# Patient Record
Sex: Female | Born: 1945 | Race: White | Hispanic: No | Marital: Married | Smoking: Never smoker
Health system: Southern US, Community
[De-identification: ages and names within clinical notes are randomized; demographics above are authoritative.]

## PROBLEM LIST (undated history)

## (undated) DIAGNOSIS — I739 Peripheral vascular disease, unspecified: Secondary | ICD-10-CM

## (undated) DIAGNOSIS — I2510 Atherosclerotic heart disease of native coronary artery without angina pectoris: Secondary | ICD-10-CM

## (undated) DIAGNOSIS — D649 Anemia, unspecified: Secondary | ICD-10-CM

## (undated) DIAGNOSIS — I10 Essential (primary) hypertension: Secondary | ICD-10-CM

## (undated) DIAGNOSIS — E785 Hyperlipidemia, unspecified: Secondary | ICD-10-CM

## (undated) DIAGNOSIS — M1990 Unspecified osteoarthritis, unspecified site: Secondary | ICD-10-CM

## (undated) DIAGNOSIS — G4730 Sleep apnea, unspecified: Secondary | ICD-10-CM

## (undated) DIAGNOSIS — I00 Rheumatic fever without heart involvement: Secondary | ICD-10-CM

## (undated) DIAGNOSIS — N186 End stage renal disease: Secondary | ICD-10-CM

## (undated) DIAGNOSIS — R011 Cardiac murmur, unspecified: Secondary | ICD-10-CM

## (undated) DIAGNOSIS — I5032 Chronic diastolic (congestive) heart failure: Secondary | ICD-10-CM

## (undated) DIAGNOSIS — I272 Pulmonary hypertension, unspecified: Secondary | ICD-10-CM

## (undated) DIAGNOSIS — I4891 Unspecified atrial fibrillation: Secondary | ICD-10-CM

## (undated) DIAGNOSIS — R001 Bradycardia, unspecified: Secondary | ICD-10-CM

## (undated) DIAGNOSIS — I4892 Unspecified atrial flutter: Secondary | ICD-10-CM

## (undated) DIAGNOSIS — F418 Other specified anxiety disorders: Secondary | ICD-10-CM

## (undated) DIAGNOSIS — D126 Benign neoplasm of colon, unspecified: Secondary | ICD-10-CM

## (undated) DIAGNOSIS — I779 Disorder of arteries and arterioles, unspecified: Secondary | ICD-10-CM

## (undated) DIAGNOSIS — G629 Polyneuropathy, unspecified: Secondary | ICD-10-CM

## (undated) HISTORY — DX: Pulmonary hypertension, unspecified: I27.20

## (undated) HISTORY — DX: Hyperlipidemia, unspecified: E78.5

## (undated) HISTORY — DX: End stage renal disease: N18.6

## (undated) HISTORY — DX: Cardiac murmur, unspecified: R01.1

## (undated) HISTORY — DX: Other specified anxiety disorders: F41.8

## (undated) HISTORY — DX: Disorder of arteries and arterioles, unspecified: I77.9

## (undated) HISTORY — PX: TUBAL LIGATION: SHX77

## (undated) HISTORY — DX: Benign neoplasm of colon, unspecified: D12.6

## (undated) HISTORY — DX: Sleep apnea, unspecified: G47.30

## (undated) HISTORY — DX: Essential (primary) hypertension: I10

## (undated) HISTORY — DX: Unspecified atrial flutter: I48.92

## (undated) HISTORY — DX: Peripheral vascular disease, unspecified: I73.9

## (undated) HISTORY — DX: Polyneuropathy, unspecified: G62.9

## (undated) HISTORY — DX: Unspecified osteoarthritis, unspecified site: M19.90

## (undated) HISTORY — DX: Anemia, unspecified: D64.9

## (undated) HISTORY — DX: Rheumatic fever without heart involvement: I00

## (undated) HISTORY — DX: Atherosclerotic heart disease of native coronary artery without angina pectoris: I25.10

## (undated) HISTORY — DX: Chronic diastolic (congestive) heart failure: I50.32

## (undated) HISTORY — DX: Bradycardia, unspecified: R00.1

---

## 2001-08-06 ENCOUNTER — Encounter: Payer: Self-pay | Admitting: *Deleted

## 2001-08-06 ENCOUNTER — Ambulatory Visit (HOSPITAL_COMMUNITY): Admission: RE | Admit: 2001-08-06 | Discharge: 2001-08-06 | Payer: Self-pay | Admitting: *Deleted

## 2001-09-14 ENCOUNTER — Ambulatory Visit (HOSPITAL_COMMUNITY): Admission: RE | Admit: 2001-09-14 | Discharge: 2001-09-14 | Payer: Self-pay | Admitting: *Deleted

## 2001-09-14 ENCOUNTER — Encounter: Payer: Self-pay | Admitting: *Deleted

## 2003-05-28 HISTORY — PX: CORONARY ARTERY BYPASS GRAFT: SHX141

## 2007-09-25 DIAGNOSIS — D126 Benign neoplasm of colon, unspecified: Secondary | ICD-10-CM

## 2007-09-25 HISTORY — DX: Benign neoplasm of colon, unspecified: D12.6

## 2008-09-05 ENCOUNTER — Ambulatory Visit (HOSPITAL_COMMUNITY): Admission: RE | Admit: 2008-09-05 | Discharge: 2008-09-05 | Payer: Self-pay | Admitting: Nephrology

## 2009-01-05 ENCOUNTER — Ambulatory Visit (HOSPITAL_COMMUNITY): Admission: RE | Admit: 2009-01-05 | Discharge: 2009-01-05 | Payer: Self-pay | Admitting: Orthopaedic Surgery

## 2009-04-29 ENCOUNTER — Inpatient Hospital Stay (HOSPITAL_COMMUNITY): Admission: EM | Admit: 2009-04-29 | Discharge: 2009-05-04 | Payer: Self-pay | Admitting: Emergency Medicine

## 2009-09-20 ENCOUNTER — Ambulatory Visit (HOSPITAL_COMMUNITY): Admission: RE | Admit: 2009-09-20 | Discharge: 2009-09-20 | Payer: Self-pay | Admitting: Cardiology

## 2009-09-26 ENCOUNTER — Ambulatory Visit: Payer: Self-pay | Admitting: Vascular Surgery

## 2009-10-04 ENCOUNTER — Encounter: Payer: Self-pay | Admitting: Vascular Surgery

## 2009-10-04 ENCOUNTER — Ambulatory Visit: Payer: Self-pay | Admitting: Vascular Surgery

## 2009-10-04 ENCOUNTER — Inpatient Hospital Stay (HOSPITAL_COMMUNITY): Admission: RE | Admit: 2009-10-04 | Discharge: 2009-10-05 | Payer: Self-pay | Admitting: Vascular Surgery

## 2009-10-04 HISTORY — PX: CAROTID ENDARTERECTOMY: SUR193

## 2009-10-24 ENCOUNTER — Ambulatory Visit: Payer: Self-pay | Admitting: Vascular Surgery

## 2010-04-02 ENCOUNTER — Other Ambulatory Visit
Admission: RE | Admit: 2010-04-02 | Discharge: 2010-04-02 | Payer: Self-pay | Source: Home / Self Care | Admitting: Orthopaedic Surgery

## 2010-04-24 ENCOUNTER — Ambulatory Visit: Payer: Self-pay | Admitting: Vascular Surgery

## 2010-05-10 ENCOUNTER — Ambulatory Visit (HOSPITAL_COMMUNITY)
Admission: RE | Admit: 2010-05-10 | Discharge: 2010-05-10 | Payer: Self-pay | Source: Home / Self Care | Attending: Orthopaedic Surgery | Admitting: Orthopaedic Surgery

## 2010-06-18 ENCOUNTER — Encounter: Payer: Self-pay | Admitting: Orthopaedic Surgery

## 2010-07-31 ENCOUNTER — Encounter: Payer: Self-pay | Admitting: Vascular Surgery

## 2010-08-06 LAB — CREATININE, SERUM
Creatinine, Ser: 1 mg/dL (ref 0.4–1.2)
GFR calc Af Amer: >60 mL/min (ref >60)
GFR calc non Af Amer: 56 mL/min — ABNORMAL LOW (ref >60)

## 2010-08-14 ENCOUNTER — Encounter (INDEPENDENT_AMBULATORY_CARE_PROVIDER_SITE_OTHER): Payer: Medicare Other

## 2010-08-14 ENCOUNTER — Encounter (INDEPENDENT_AMBULATORY_CARE_PROVIDER_SITE_OTHER): Payer: Medicare Other | Admitting: Vascular Surgery

## 2010-08-14 DIAGNOSIS — I70219 Atherosclerosis of native arteries of extremities with intermittent claudication, unspecified extremity: Secondary | ICD-10-CM

## 2010-08-14 DIAGNOSIS — I739 Peripheral vascular disease, unspecified: Secondary | ICD-10-CM

## 2010-08-14 LAB — BASIC METABOLIC PANEL
Calcium: 8.5 mg/dL (ref 8.4–10.5)
GFR calc Af Amer: >60 mL/min (ref >60)
GFR calc non Af Amer: 50 mL/min — ABNORMAL LOW (ref >60)
Sodium: 135 mEq/L (ref 135–145)

## 2010-08-14 LAB — CROSSMATCH

## 2010-08-14 LAB — COMPREHENSIVE METABOLIC PANEL
ALT: 15 U/L (ref 0–35)
AST: 18 U/L (ref 0–37)
Alkaline Phosphatase: 56 U/L (ref 39–117)
CO2: 25 mEq/L (ref 19–32)
Calcium: 9.7 mg/dL (ref 8.4–10.5)
Chloride: 104 mEq/L (ref 96–112)
GFR calc Af Amer: 59 mL/min — ABNORMAL LOW (ref >60)
GFR calc non Af Amer: 49 mL/min — ABNORMAL LOW (ref >60)
Potassium: 4.9 mEq/L (ref 3.5–5.1)
Sodium: 139 mEq/L (ref 135–145)
Total Bilirubin: 0.4 mg/dL (ref 0.3–1.2)

## 2010-08-14 LAB — GLUCOSE, CAPILLARY
Glucose-Capillary: 136 mg/dL — ABNORMAL HIGH (ref 70–99)
Glucose-Capillary: 184 mg/dL — ABNORMAL HIGH (ref 70–99)
Glucose-Capillary: 185 mg/dL — ABNORMAL HIGH (ref 70–99)

## 2010-08-14 LAB — CBC
Hemoglobin: 9.7 g/dL — ABNORMAL LOW (ref 12.0–15.0)
RBC: 3.28 MIL/uL — ABNORMAL LOW (ref 3.87–5.11)
RBC: 4.1 MIL/uL (ref 3.87–5.11)
WBC: 7.7 10*3/uL (ref 4.0–10.5)

## 2010-08-14 LAB — ABO/RH: ABO/RH(D): A POS

## 2010-08-14 LAB — URINALYSIS, ROUTINE W REFLEX MICROSCOPIC
Glucose, UA: NEGATIVE mg/dL
Ketones, ur: NEGATIVE mg/dL
pH: 5 (ref 5.0–8.0)

## 2010-08-14 NOTE — Procedures (Unsigned)
LOWER EXTREMITY ARTERIAL DUPLEX  INDICATION:  Claudication.  HISTORY: Diabetes:  Yes. Cardiac:  Bypass. Hypertension:  Yes. Smoking:  No. Previous Surgery:  Left greater saphenous vein harvested for bypass graft for heart bypass.  SINGLE LEVEL ARTERIAL EXAM                         RIGHT                LEFT Brachial:               241                  243 Anterior tibial:        217                  183 Posterior tibial:       249                  176 Peroneal: Ankle/Brachial Index:   1.02                 0.75  LOWER EXTREMITY ARTERIAL DUPLEX EXAM  DUPLEX:  Patent left lower extremity with moderately broadened biphasic and monophasic waveforms.  Moderate-to-severe atherosclerosis within the left lower extremity.  The proximal common femoral artery was 1.08 m/s. The mid distal was 0.33 m/s with a 2:1 ratio, suggestive of >50% stenosis within the superficial femoral artery.  IMPRESSION:  Stable ankle brachial index.  Left lower extremity arterial duplex described above.  ___________________________________________ Quita Skye. Hart Rochester, M.D.  OD/MEDQ  D:  08/14/2010  T:  08/14/2010  Job:  [id number]

## 2010-08-15 NOTE — Assessment & Plan Note (Signed)
OFFICE VISIT  Briana, [name] DOB:  September 19, 1945                                       08/14/2010 CHART#:[id number]  This is a patient who is well-known to me, having previously had carotid surgery in May 2011 for severe right internal carotid stenosis.  She has been asymptomatic since that time.  She was referred by Dr. Hilda Lias today because of pain in both legs, left worse than right.  She states that she has a long history of neuropathy in both feet and has a burning discomfort as well as an aching, throbbing discomfort which occurs at night and during the day and is almost constant.  It is somewhat worse on the left than the right but is fairly severe in both feet.  She denies any history of nonhealing ulcers, infection, or ulceration.  She does not ambulate long distances.  CHRONIC MEDICAL PROBLEMS: 1. Coronary artery disease, previous coronary bypass grafting in 2005. 2. diabetes mellitus. 3. Hyperlipidemia. 4. Chronic renal insufficiency. 5. Peripheral neuropathy.  FAMILY HISTORY:  Positive for coronary artery disease in her father and 2 brothers.  Negative for stroke.  Positive for diabetes in the father.  REVIEW OF SYSTEMS:  Positive for dyspnea on exertion.  Denies any chest pain.  Does have lower extremity symptoms, as noted above.  All other systems are negative in complete review of systems.  PHYSICAL EXAMINATION:  Blood pressure is 191/92, heart rate 61, respirations 18.  General:  She is an obese female who is in no apparent distress.  Alert and oriented x3.  HEENT:  Normal for age.  Lungs: Clear to auscultation.  No rhonchi or wheezing.  Cardiovascular: Regular rhythm.  No murmurs.  Abdomen:  Obese.  No palpable masses. Lower extremity exam a large panniculus in both inguinal areas.  She has 3+ femoral and popliteal pulse on the right with a 2+ dorsalis pedis pulse.  The left foot has a 3+ femoral.  Absent popliteal and  distal pulses.  Today I ordered lower extremity arterial Dopplers which revealed an ABI of 0.75 on the left and 1.0 on the right.  I think she has left superficial femoral occlusive disease, but it is not causing her symptoms.  I think most of her symptoms are due to peripheral neuropathy, and I would not recommend any further evaluation or intervention on her vascular tree, as it will not help her symptomatology.  I discussed this with her at length.  We will see her back in 3 months for a follow-up carotid duplex exam regarding the carotid surgery I did last May, which is currently asymptomatic.    Briana [name], M.D. Electronically Signed  JDL/MEDQ  D:  08/14/2010  T:  08/15/2010  Job:  [id number]  cc:   Teola Bradley, M.D. Dr. Reynolds Bowl, [place]

## 2010-08-28 LAB — CBC
HCT: 27.8 % — ABNORMAL LOW (ref 36.0–46.0)
HCT: 28.2 % — ABNORMAL LOW (ref 36.0–46.0)
HCT: 36.5 % (ref 36.0–46.0)
HCT: 36.7 % (ref 36.0–46.0)
Hemoglobin: 12 g/dL (ref 12.0–15.0)
Hemoglobin: 12.1 g/dL (ref 12.0–15.0)
Hemoglobin: 9.3 g/dL — ABNORMAL LOW (ref 12.0–15.0)
Hemoglobin: 9.4 g/dL — ABNORMAL LOW (ref 12.0–15.0)
Hemoglobin: 9.6 g/dL — ABNORMAL LOW (ref 12.0–15.0)
MCHC: 33.2 g/dL (ref 30.0–36.0)
MCHC: 33.2 g/dL (ref 30.0–36.0)
MCV: 82.8 fL (ref 78.0–100.0)
MCV: 84.5 fL (ref 78.0–100.0)
Platelets: 143 10*3/uL — ABNORMAL LOW (ref 150–400)
Platelets: 153 10*3/uL (ref 150–400)
Platelets: 166 10*3/uL (ref 150–400)
Platelets: 187 10*3/uL (ref 150–400)
Platelets: 189 10*3/uL (ref 150–400)
RBC: 3.36 MIL/uL — ABNORMAL LOW (ref 3.87–5.11)
RBC: 3.42 MIL/uL — ABNORMAL LOW (ref 3.87–5.11)
RDW: 15.7 % — ABNORMAL HIGH (ref 11.5–15.5)
RDW: 15.9 % — ABNORMAL HIGH (ref 11.5–15.5)
WBC: 16.7 10*3/uL — ABNORMAL HIGH (ref 4.0–10.5)
WBC: 18 10*3/uL — ABNORMAL HIGH (ref 4.0–10.5)
WBC: 7.2 10*3/uL (ref 4.0–10.5)
WBC: 8.8 10*3/uL (ref 4.0–10.5)
WBC: 9.1 10*3/uL (ref 4.0–10.5)

## 2010-08-28 LAB — BASIC METABOLIC PANEL
BUN: 28 mg/dL — ABNORMAL HIGH (ref 6–23)
BUN: 34 mg/dL — ABNORMAL HIGH (ref 6–23)
CO2: 25 mEq/L (ref 19–32)
Calcium: 8.2 mg/dL — ABNORMAL LOW (ref 8.4–10.5)
Calcium: 8.3 mg/dL — ABNORMAL LOW (ref 8.4–10.5)
Chloride: 100 mEq/L (ref 96–112)
Chloride: 110 mEq/L (ref 96–112)
Creatinine, Ser: 1.19 mg/dL (ref 0.4–1.2)
Creatinine, Ser: 1.69 mg/dL — ABNORMAL HIGH (ref 0.4–1.2)
Creatinine, Ser: 1.76 mg/dL — ABNORMAL HIGH (ref 0.4–1.2)
GFR calc Af Amer: 37 mL/min — ABNORMAL LOW (ref >60)
GFR calc Af Amer: 55 mL/min — ABNORMAL LOW (ref >60)
GFR calc non Af Amer: 29 mL/min — ABNORMAL LOW (ref >60)
GFR calc non Af Amer: 46 mL/min — ABNORMAL LOW (ref >60)
Glucose, Bld: 167 mg/dL — ABNORMAL HIGH (ref 70–99)
Potassium: 3.5 mEq/L (ref 3.5–5.1)
Potassium: 3.9 mEq/L (ref 3.5–5.1)
Sodium: 140 mEq/L (ref 135–145)

## 2010-08-28 LAB — DIFFERENTIAL
Basophils Absolute: 0 10*3/uL (ref 0.0–0.1)
Basophils Absolute: 0 10*3/uL (ref 0.0–0.1)
Basophils Absolute: 0.1 10*3/uL (ref 0.0–0.1)
Basophils Relative: 0 % (ref 0–1)
Basophils Relative: 1 % (ref 0–1)
Eosinophils Absolute: 0 10*3/uL (ref 0.0–0.7)
Eosinophils Absolute: 0.1 10*3/uL (ref 0.0–0.7)
Eosinophils Absolute: 0.1 10*3/uL (ref 0.0–0.7)
Eosinophils Absolute: 0.4 10*3/uL (ref 0.0–0.7)
Eosinophils Relative: 0 % (ref 0–5)
Eosinophils Relative: 5 % (ref 0–5)
Eosinophils Relative: 5 % (ref 0–5)
Lymphocytes Relative: 20 % (ref 12–46)
Lymphocytes Relative: 20 % (ref 12–46)
Lymphocytes Relative: 22 % (ref 12–46)
Lymphs Abs: 0.8 10*3/uL (ref 0.7–4.0)
Lymphs Abs: 1.5 10*3/uL (ref 0.7–4.0)
Lymphs Abs: 1.8 10*3/uL (ref 0.7–4.0)
Lymphs Abs: 1.9 10*3/uL (ref 0.7–4.0)
Lymphs Abs: 2 10*3/uL (ref 0.7–4.0)
Monocytes Absolute: 0.5 10*3/uL (ref 0.1–1.0)
Monocytes Absolute: 0.6 10*3/uL (ref 0.1–1.0)
Monocytes Absolute: 0.9 10*3/uL (ref 0.1–1.0)
Monocytes Absolute: 0.9 10*3/uL (ref 0.1–1.0)
Monocytes Absolute: 0.9 10*3/uL (ref 0.1–1.0)
Monocytes Relative: 10 % (ref 3–12)
Monocytes Relative: 5 % (ref 3–12)
Monocytes Relative: 9 % (ref 3–12)
Neutro Abs: 15.2 10*3/uL — ABNORMAL HIGH (ref 1.7–7.7)
Neutro Abs: 4.5 10*3/uL (ref 1.7–7.7)
Neutro Abs: 5.9 10*3/uL (ref 1.7–7.7)
Neutrophils Relative %: 58 % (ref 43–77)
Neutrophils Relative %: 65 % (ref 43–77)
Neutrophils Relative %: 70 % (ref 43–77)

## 2010-08-28 LAB — VITAMIN B12: Vitamin B-12: 244 pg/mL (ref 211–911)

## 2010-08-28 LAB — GLUCOSE, CAPILLARY
Glucose-Capillary: 104 mg/dL — ABNORMAL HIGH (ref 70–99)
Glucose-Capillary: 106 mg/dL — ABNORMAL HIGH (ref 70–99)
Glucose-Capillary: 137 mg/dL — ABNORMAL HIGH (ref 70–99)
Glucose-Capillary: 142 mg/dL — ABNORMAL HIGH (ref 70–99)
Glucose-Capillary: 164 mg/dL — ABNORMAL HIGH (ref 70–99)
Glucose-Capillary: 164 mg/dL — ABNORMAL HIGH (ref 70–99)
Glucose-Capillary: 167 mg/dL — ABNORMAL HIGH (ref 70–99)
Glucose-Capillary: 169 mg/dL — ABNORMAL HIGH (ref 70–99)
Glucose-Capillary: 170 mg/dL — ABNORMAL HIGH (ref 70–99)
Glucose-Capillary: 172 mg/dL — ABNORMAL HIGH (ref 70–99)
Glucose-Capillary: 177 mg/dL — ABNORMAL HIGH (ref 70–99)
Glucose-Capillary: 191 mg/dL — ABNORMAL HIGH (ref 70–99)

## 2010-08-28 LAB — PROTEIN, URINE, 24 HOUR: Urine Total Volume-UPROT: 3475 mL

## 2010-08-28 LAB — COMPREHENSIVE METABOLIC PANEL
ALT: 16 U/L (ref 0–35)
Albumin: 3.9 g/dL (ref 3.5–5.2)
Alkaline Phosphatase: 56 U/L (ref 39–117)
BUN: 24 mg/dL — ABNORMAL HIGH (ref 6–23)
Chloride: 99 mEq/L (ref 96–112)
Glucose, Bld: 148 mg/dL — ABNORMAL HIGH (ref 70–99)
Potassium: 3.6 mEq/L (ref 3.5–5.1)
Total Bilirubin: 0.5 mg/dL (ref 0.3–1.2)

## 2010-08-28 LAB — CULTURE, BLOOD (ROUTINE X 2)
Culture: NO GROWTH
Culture: NO GROWTH

## 2010-08-28 LAB — TSH: TSH: 0.604 u[IU]/mL (ref 0.350–4.500)

## 2010-08-28 LAB — CK TOTAL AND CKMB (NOT AT ARMC)
Relative Index: INVALID (ref 0.0–2.5)
Total CK: 81 U/L (ref 7–177)

## 2010-08-28 LAB — RENAL FUNCTION PANEL
Albumin: 2.6 g/dL — ABNORMAL LOW (ref 3.5–5.2)
BUN: 28 mg/dL — ABNORMAL HIGH (ref 6–23)
Calcium: 8.2 mg/dL — ABNORMAL LOW (ref 8.4–10.5)
Chloride: 110 mEq/L (ref 96–112)
Creatinine, Ser: 1.44 mg/dL — ABNORMAL HIGH (ref 0.4–1.2)
GFR calc Af Amer: 44 mL/min — ABNORMAL LOW (ref >60)

## 2010-08-28 LAB — LIPID PANEL
Cholesterol: 142 mg/dL (ref 0–200)
LDL Cholesterol: 71 mg/dL (ref 0–99)
VLDL: 30 mg/dL (ref 0–40)

## 2010-08-28 LAB — URINALYSIS, ROUTINE W REFLEX MICROSCOPIC
Bilirubin Urine: NEGATIVE
Ketones, ur: NEGATIVE mg/dL
Nitrite: NEGATIVE
Protein, ur: >300 mg/dL — AB
Specific Gravity, Urine: 1.02 (ref 1.005–1.030)
Urobilinogen, UA: 0.2 mg/dL (ref 0.0–1.0)

## 2010-08-28 LAB — HEMOGLOBIN A1C: Hgb A1c MFr Bld: 6.7 % — ABNORMAL HIGH (ref 4.6–6.1)

## 2010-08-28 LAB — URINE MICROSCOPIC-ADD ON

## 2010-08-28 LAB — IRON AND TIBC: UIBC: 245 ug/dL

## 2010-08-28 LAB — CREATININE, URINE, 24 HOUR
Collection Interval-UCRE24: 24 hours
Creatinine, 24H Ur: 1195 mg/d (ref 700–1800)
Creatinine, Urine: 34.4 mg/dL
Urine Total Volume-UCRE24: 3475 mL

## 2010-08-28 LAB — PHOSPHORUS: Phosphorus: 2.9 mg/dL (ref 2.3–4.6)

## 2010-08-28 LAB — TROPONIN I: Troponin I: 0.03 ng/mL (ref 0.00–0.06)

## 2010-08-28 LAB — FERRITIN: Ferritin: 92 ng/mL (ref 10–291)

## 2010-10-09 NOTE — Assessment & Plan Note (Signed)
OFFICE VISIT   Briana [name], Briana [name]  DOB:  04-Mar-1946                                       10/24/2009  CHART#:[id number]   The patient returns 3 weeks post right carotid endarterectomy for severe  right internal carotid stenosis which was asymptomatic and confirmed by  CT angiography done at [facility] preoperatively.  She had an  approximate 50% left internal carotid stenosis.  She has had no  neurological symptoms since her surgery including hemiparesis, aphasia,  amaurosis fugax, diplopia, blurred vision or syncope.  She is swallowing  well and has no hoarseness.  She is taking one aspirin per day.   On physical exam today her blood pressure is 168/78 in the left arm,  heart rate 75, respirations 14.  Right neck incision has healed nicely.  Neurologic exam is normal.  Carotid pulses are 3+ with no audible  bruits.   I have reassured her regarding these findings and will see her in 6  months with followup carotid duplex exam in our office unless she  develops any neurologic symptoms in the interim.     [name], M.D.  Electronically Signed   JDL/MEDQ  D:  10/24/2009  T:  10/25/2009  Job:  [id number]   cc:   Sheliah Mends, MD

## 2010-10-09 NOTE — H&P (Signed)
HISTORY AND PHYSICAL EXAMINATION   Sep 26, 2009   Re:  Briana [name], HOUSE                  DOB:  1945/08/19   DATE OF ADMISSION:  May 11.   CHIEF COMPLAINT:  Severe right internal carotid stenosis - asymptomatic.   HISTORY OF PRESENT ILLNESS:  This 65 year old female patient was found  by Dr. Garen Lah to have a severe right internal carotid stenosis by  duplex scanning.  She denies any history of stroke, amaurosis fugax,  diplopia, blurred vision, syncope or other neurologic symptoms.  This  was confirmed by CT angiogram done at [facility], which I have  reviewed, and it did reveal a 90%-95% right internal carotid stenosis  and moderate left internal carotid stenosis approximating 50%.  She is  now scheduled for right carotid surgery.  I have also reviewed her  carotid duplex exam.   CHRONIC MEDICAL PROBLEMS:  1. Coronary artery disease.  2. Hypertension.  3. Obesity.  4. Hyperlipidemia.  5. Chronic renal insufficiency.  6. Type 1 diabetes mellitus.   PREVIOUS SURGERY:  Includes coronary artery bypass grafting in 2005 and  tubal ligation.   FAMILY HISTORY:  Positive for coronary artery disease in most all of her  family members including mother, father and 2 brothers who died at young  ages of myocardial infarction.  Negative for stroke.  Positive for  diabetes in her father.   SOCIAL HISTORY:  She is married and has 1 child, is retired.  Does not  use tobacco or alcohol.   REVIEW OF SYSTEMS:  Positive for palpitations, heart murmur, lower  extremity discomfort when walking long distances, occasional numbness in  her feet, arthritis joint pain, muscle pain, depression, anxiety,  decreased hearing acuity.  She is able to walk 1 mile on a treadmill.  All other systems in the review of systems are negative.   PHYSICAL EXAM:  Blood pressure 162/66, heart rate is 62, temperature  97.9.  Generally, she is an obese female who is in no apparent  distress.  She is alert and oriented x3.  HEENT exam is normal.  EOMs intact.  Chest clear to auscultation.  No rhonchi or wheezing.  Cardiovascular  exam:  Regular rate and rhythm.  No murmurs are heard.  Carotid pulses  are 3+ with soft bruits bilaterally.  Abdomen is obese.  No palpable  masses.  Musculoskeletal exam is free of major deformities.  Neurologic  exam is normal.  Skin is free of rashes.  Lower extremity exam reveals  3+ femoral and 2+ dorsalis pedis pulses bilaterally.   IMPRESSION:  1. Severe right internal carotid stenosis - asymptomatic.  2. Coronary artery disease - the patient had normal myocardial      perfusion scan January 2011 with no evidence of ischemia  and is      asymptomatic.   PLAN:  To admit the patient on May 11 for an elective right carotid  endarterectomy.  Risks and benefits have been thoroughly discussed with  the patient including the potential for stroke, and she would like to  proceed.     [name], M.D.  Electronically Signed   JDL/MEDQ  D:  09/26/2009  T:  09/27/2009  Job:  [id number]   cc:   Sheliah Mends, MD  Dr. Jorene Guest

## 2010-10-09 NOTE — Assessment & Plan Note (Signed)
OFFICE VISIT   AZLIN, ZILBERMAN  DOB:  1945-09-14                                       04/24/2010  CHART#:[id number]   The patient returns today for 6 month followup regarding her right  carotid endarterectomy with resection of redundant common carotid artery  done in May of this year for severe but asymptomatic right internal  carotid stenosis.  She continues to be asymptomatic, denying any  hemiparesis, aphasia, amaurosis fugax, diplopia, blurred vision or  syncope since her surgery.  She is swallowing well, has no hoarseness or  complaints.   CHRONIC MEDICAL PROBLEMS:  1. Coronary artery disease, previous coronary artery bypass grafting      in 2005.  2. Diabetes mellitus.  3. Hyperlipidemia.  4. Chronic renal insufficiency.   FAMILY HISTORY:  Strongly positive for coronary artery disease with  father and two brothers.  Negative for stroke.  Positive for diabetes in  father.   REVIEW OF SYSTEMS:  Positive dyspnea on exertion.  Negative for  claudication or chest pain.  All other systems are negative in review of  systems.   PHYSICAL EXAM:  Vital signs:  Blood pressure 148/88, heart rate 78,  respirations 16.  General:  She is an obese, well-developed female who  is in no apparent distress, alert and oriented x3.  HEENT:  Normal for  age.  EOMs intact.  Lungs:  Clear to auscultation.  No rhonchi or  wheezing.  Cardiovascular:  Regular rhythm, no murmurs.  Carotid pulses  3+, soft bruit on the left.  Abdomen:  Obese.  No palpable masses.  Musculoskeletal:  Free of major deformities.  Neurological:  Normal.   [facility] performed a carotid duplex exam on  October 18 of this year which I have reviewed.  It appears that from  this exam she has a 60% left internal carotid stenosis and no restenosis  in the right internal carotid where the endarterectomy was performed.   In general I think she is doing well and will return in  6 months with a  carotid duplex exam performed in our office and I will see her at that  time unless she develops any symptoms in the interim.     [name], M.D.  Electronically Signed   JDL/MEDQ  D:  04/24/2010  T:  04/24/2010  Job:  [id number]

## 2010-10-23 ENCOUNTER — Other Ambulatory Visit: Payer: Self-pay

## 2010-11-14 ENCOUNTER — Other Ambulatory Visit (INDEPENDENT_AMBULATORY_CARE_PROVIDER_SITE_OTHER): Payer: Medicare Other

## 2010-11-14 ENCOUNTER — Encounter (INDEPENDENT_AMBULATORY_CARE_PROVIDER_SITE_OTHER): Payer: Medicare Other

## 2010-11-14 DIAGNOSIS — I739 Peripheral vascular disease, unspecified: Secondary | ICD-10-CM

## 2010-11-14 DIAGNOSIS — I6529 Occlusion and stenosis of unspecified carotid artery: Secondary | ICD-10-CM

## 2010-11-20 NOTE — Procedures (Unsigned)
CAROTID DUPLEX EXAM  INDICATION:  Follow up carotid artery disease.  HISTORY: Diabetes:  Yes. Cardiac:  CABG. Hypertension:  Yes. Smoking:  No. Previous Surgery:  Right carotid endarterectomy, May 2011. CV History:  Currently asymptomatic. Amaurosis Fugax No, Paresthesias No, Hemiparesis No.                                      RIGHT             LEFT Brachial systolic pressure:         191               184 Brachial Doppler waveforms:         Normal            Normal Vertebral direction of flow:        Antegrade         Antegrade DUPLEX VELOCITIES (cm/sec) CCA peak systolic                   96                92 ECA peak systolic                   257               221 ICA peak systolic                   149               203 ICA end diastolic                   34                57 PLAQUE MORPHOLOGY:                  Heterogenous      Calcific PLAQUE AMOUNT:                      Minimal           Moderate PLAQUE LOCATION:                    CCA, ECA          Bifurcation, ICA, ECA  IMPRESSION: 1. Patent right carotid endarterectomy site with no evidence of     restenosis of the internal carotid artery. 2. Left internal carotid artery velocities are suggestive of 40% to     59% stenosis. 3. Bilateral external carotid artery stenosis. 4. Antegrade vertebral arteries bilaterally.  ___________________________________________ [name], M.D.  EM/MEDQ  D:  11/14/2010  T:  11/14/2010  Job:  [id number]

## 2010-11-29 ENCOUNTER — Ambulatory Visit (HOSPITAL_COMMUNITY)
Admission: RE | Admit: 2010-11-29 | Discharge: 2010-11-29 | Disposition: A | Discharge status: Home / Self Care | Payer: Medicare Other | Source: Ambulatory Visit | Attending: Cardiovascular Disease | Admitting: Cardiovascular Disease

## 2010-11-29 DIAGNOSIS — R011 Cardiac murmur, unspecified: Secondary | ICD-10-CM | POA: Insufficient documentation

## 2010-11-29 DIAGNOSIS — I10 Essential (primary) hypertension: Secondary | ICD-10-CM | POA: Insufficient documentation

## 2010-11-29 DIAGNOSIS — E119 Type 2 diabetes mellitus without complications: Secondary | ICD-10-CM | POA: Insufficient documentation

## 2011-11-04 ENCOUNTER — Encounter: Payer: Self-pay | Admitting: Vascular Surgery

## 2011-11-13 ENCOUNTER — Encounter: Payer: Self-pay | Admitting: Neurosurgery

## 2011-11-14 ENCOUNTER — Other Ambulatory Visit: Payer: Medicare Other

## 2011-11-14 ENCOUNTER — Ambulatory Visit: Payer: Medicare Other | Admitting: Neurosurgery

## 2011-12-02 ENCOUNTER — Encounter: Payer: Self-pay | Admitting: Neurosurgery

## 2011-12-03 ENCOUNTER — Ambulatory Visit (INDEPENDENT_AMBULATORY_CARE_PROVIDER_SITE_OTHER): Payer: Medicare Other | Admitting: Neurosurgery

## 2011-12-03 ENCOUNTER — Encounter: Payer: Self-pay | Admitting: Neurosurgery

## 2011-12-03 ENCOUNTER — Ambulatory Visit (INDEPENDENT_AMBULATORY_CARE_PROVIDER_SITE_OTHER): Payer: Medicare Other | Admitting: *Deleted

## 2011-12-03 VITALS — BP 151/73 | HR 64 | Resp 18 | Ht 64.0 in | Wt 243.2 lb

## 2011-12-03 DIAGNOSIS — I6529 Occlusion and stenosis of unspecified carotid artery: Secondary | ICD-10-CM

## 2011-12-03 DIAGNOSIS — Z48812 Encounter for surgical aftercare following surgery on the circulatory system: Secondary | ICD-10-CM

## 2011-12-03 NOTE — Progress Notes (Signed)
[facility] Carotid Office Note  CC: Annual carotid duplex for surveillance Referring Physician: Hart Rochester  History of Present Illness: 66 year old female patient of Dr. Hart Rochester who underwent a right CEA in may of 2011. The patient denies any signs or symptoms of CVA, TIA, amaurosis fugax or any neural deficit. The patient also denies any new medical diagnoses or recent surgery.  Past Medical History  Diagnosis Date  . Diabetes mellitus   . Hypertension   . Hyperlipidemia   . Heart murmur   . Peripheral vascular disease   . Arthritis   . Depression with anxiety   . Carotid artery occlusion   . Chronic kidney disease   . Neuropathy     ROS: [x]  Positive   [ ]  Denies    General: [ ]  Weight loss, [ ]  Fever, [ ]  chills Neurologic: [ ]  Dizziness, [ ]  Blackouts, [ ]  Seizure [ ]  Stroke, [ ]  "Mini stroke", [ ]  Slurred speech, [ ]  Temporary blindness; [ ]  weakness in arms or legs, [ ]  Hoarseness Cardiac: [ ]  Chest pain/pressure, [ ]  Shortness of breath at rest [ ]  Shortness of breath with exertion, [ ]  Atrial fibrillation or irregular heartbeat Vascular: [ ]  Pain in legs with walking, [ ]  Pain in legs at rest, [ ]  Pain in legs at night,  [ ]  Non-healing ulcer, [ ]  Blood clot in vein/DVT,   Pulmonary: [ ]  Home oxygen, [ ]  Productive cough, [ ]  Coughing up blood, [ ]  Asthma,  [ ]  Wheezing Musculoskeletal:  [ ]  Arthritis, [ ]  Low back pain, [ ]  Joint pain Hematologic: [ ]  Easy Bruising, [ ]  Anemia; [ ]  Hepatitis Gastrointestinal: [ ]  Blood in stool, [ ]  Gastroesophageal Reflux/heartburn, [ ]  Trouble swallowing Urinary: [ ]  chronic Kidney disease, [ ]  on HD - [ ]  MWF or [ ]  TTHS, [ ]  Burning with urination, [ ]  Difficulty urinating Skin: [ ]  Rashes, [ ]  Wounds Psychological: [ ]  Anxiety, [ ]  Depression   Social History History  Substance Use Topics  . Smoking status: Never Smoker   . Smokeless tobacco: Not on file  . Alcohol Use: No    Family  History Family History  Problem Relation Age of Onset  . Cancer Mother   . Heart disease Father   . Diabetes Father   . Heart disease Brother     Allergies  Allergen Reactions  . Macrobid (Nitrofurantoin) Nausea And Vomiting  . Oxycodone     Current Outpatient Prescriptions  Medication Sig Dispense Refill  . ALPRAZolam (XANAX) 0.5 MG tablet Take 0.5 mg by mouth at bedtime as needed.      Marland Kitchen aspirin 325 MG tablet Take 325 mg by mouth daily.      Marland Kitchen atenolol (TENORMIN) 25 MG tablet Take 25 mg by mouth daily.      . cetirizine (ZYRTEC) 10 MG tablet Take by mouth daily.      . fosinopril (MONOPRIL) 40 MG tablet Take 40 mg by mouth daily.      . furosemide (LASIX) 40 MG tablet Take 40 mg by mouth daily.      Marland Kitchen HYDROcodone-acetaminophen (LORTAB) 10-500 MG per tablet Take 1 tablet by mouth every 6 (six) hours as needed.      . insulin NPH-insulin regular (NOVOLIN 70/30) (70-30) 100 UNIT/ML injection Inject into the skin.      . metFORMIN (GLUCOPHAGE) 1000 MG tablet Take 1,000 mg by mouth 2 (two) times daily with a  meal.      . naproxen (NAPROSYN) 500 MG tablet Take 500 mg by mouth 2 (two) times daily with a meal.      . POTASSIUM BICARBONATE PO Take by mouth.      . predniSONE (DELTASONE) 5 MG tablet Take 5 mg by mouth daily.      . sertraline (ZOLOFT) 50 MG tablet Take 50 mg by mouth daily.      . simvastatin (ZOCOR) 40 MG tablet Take 40 mg by mouth every evening.        Physical Examination  Filed Vitals:   12/03/11 1453  BP: 151/73  Pulse: 64  Resp:     Body mass index is 41.75 kg/(m^2).  General:  WDWN in NAD Gait: Normal HEENT: WNL Eyes: Pupils equal Pulmonary: normal non-labored breathing , without Rales, rhonchi,  wheezing Cardiac: RRR, without  Murmurs, rubs or gallops; Abdomen: soft, NT, no masses Skin: no rashes, ulcers noted  Vascular Exam Pulses: 2+ radial pulses bilaterally Carotid bruits: Carotid pulses to auscultation no bruits are heard Extremities  without ischemic changes, no Gangrene , no cellulitis; no open wounds;  Musculoskeletal: no muscle wasting or atrophy   Neurologic: A&O X 3; Appropriate Affect ; SENSATION: normal; MOTOR FUNCTION:  moving all extremities equally. Speech is fluent/normal  Non-Invasive Vascular Imaging CAROTID DUPLEX 12/03/2011  Right ICA 0 - 19% stenosis Left ICA 40 - 59 % stenosis   ASSESSMENT/PLAN: Asymptomatic patient status post right CEA in 2011 with mild to moderate  left ICA stenosis. The patient will followup here in one year with repeat carotid duplex. Her questions were encouraged and answered ,she is in agreement with this plan.  Lauree Chandler ANP Clinic MD: Hart Rochester

## 2011-12-04 NOTE — Addendum Note (Signed)
Addended by: Sharee Pimple on: 12/04/2011 09:33 AM   Modules accepted: Orders

## 2011-12-11 NOTE — Procedures (Unsigned)
CAROTID DUPLEX EXAM  INDICATION:  Carotid disease.  HISTORY: Diabetes:  Yes Cardiac:  CABG Hypertension:  Yes Smoking:  No Previous Surgery:  Right CEA 09/2009 CV History:  Currently asymptomatic Amaurosis Fugax No, Paresthesias No, Hemiparesis No                                      RIGHT             LEFT Brachial systolic pressure:         188               180 Brachial Doppler waveforms:         Normal            Normal Vertebral direction of flow:        Antegrade         Antegrade DUPLEX VELOCITIES (cm/sec) CCA peak systolic                   96                82 ECA peak systolic                   135               214 ICA peak systolic                   105               196 ICA end diastolic                   31                41 PLAQUE MORPHOLOGY:                  Heterogeneous     Calcific PLAQUE AMOUNT:                      Minimal           Moderate PLAQUE LOCATION:                    ECA, CCA          Bifurcation, ICA, ECA  IMPRESSION: 1. Patent right carotid endarterectomy site with no evidence of     restenosis of the internal carotid artery. 2. Left internal carotid artery velocity suggests 40%-59% stenosis. 3. Bilateral external carotid artery stenosis. 4. Antegrade vertebral arteries bilaterally.  ___________________________________________ [name], M.D.  EM/MEDQ  D:  12/03/2011  T:  12/03/2011  Job:  [id number]

## 2012-01-28 ENCOUNTER — Ambulatory Visit (INDEPENDENT_AMBULATORY_CARE_PROVIDER_SITE_OTHER): Payer: Medicare Other | Admitting: Urgent Care

## 2012-01-28 ENCOUNTER — Encounter: Payer: Self-pay | Admitting: Urgent Care

## 2012-01-28 VITALS — BP 174/70 | HR 65 | Temp 96.5°F | Ht 63.0 in | Wt 246.0 lb

## 2012-01-28 DIAGNOSIS — Z86010 Personal history of colonic polyps: Secondary | ICD-10-CM

## 2012-01-28 NOTE — Patient Instructions (Addendum)
Colonoscopy with Dr Darrick Penna Take half your insulin (30 units) the day prior to the colonoscopy (date of prep) Take 1/2 of your Januvia & Glucophage in morning & evening the day prior to your colonoscopy (date of prep) Early morning appointment-hold diabetes medications day of procedure Bring all your medications and/or any insulin to the hospital the day of the procedure. Follow blood sugars, call us or your PCP if any problems.

## 2012-01-28 NOTE — Progress Notes (Signed)
Referring Provider: Reynolds Bowl, MD Primary Care Physician:  Reynolds Bowl, MD Primary Gastroenterologist:  Dr. Jonette Eva  Chief Complaint  Patient presents with  . Colonoscopy    hx polyps    HPI:  Briana [name] is a 66 y.o. female here as a referral from Dr. Jorene Guest for surveillance colonoscopy.  Hx adenomatous colon polyps with last colonoscopy in [place] [facility] by Dr Samuella Cota.  Pt states multiple adenomatous colon polyps were removed & she was told she needed a repeat exam in 4 yrs (May 2013).  She has had a bit of constipation with her pain meds, but denies any other GI concerns.  Denies rectal bleeding or melena.  Denies abdominal pain.  Denies wt loss.   Denies any upper GI symptoms including heartburn, indigestion, nausea, vomiting, dysphagia, odynophagia or anorexia.  Past Medical History  Diagnosis Date  . Diabetes mellitus   . Hypertension   . Hyperlipidemia   . Heart murmur   . Peripheral vascular disease   . Arthritis   . Depression with anxiety   . Carotid artery occlusion   . Chronic kidney disease   . Neuropathy   . CAD (coronary artery disease)   . Rheumatic fever   . Sleep apnea   . PAD (peripheral artery disease)   . Adenomatous colon polyp 09/2007    Past Surgical History  Procedure Date  . Coronary artery bypass graft 2005  . Carotid endarterectomy 10/04/2009    Right CEA  . Tubal ligation     Current Outpatient Prescriptions  Medication Sig Dispense Refill  . ALPRAZolam (XANAX) 0.5 MG tablet Take 0.5 mg by mouth 3 (three) times daily as needed.       Marland Kitchen aspirin 325 MG tablet Take 325 mg by mouth daily.      . cetirizine (ZYRTEC) 10 MG tablet Take by mouth daily.      . cholecalciferol (VITAMIN D) 1000 UNITS tablet Take 1,000 Units by mouth daily.      . diazepam (VALIUM) 5 MG tablet Take 5 mg by mouth at bedtime.      . fluticasone (FLONASE) 50 MCG/ACT nasal spray Place 2 sprays into the nose daily.      . fosinopril (MONOPRIL) 40 MG tablet  Take 20 mg by mouth 2 (two) times daily.       . furosemide (LASIX) 40 MG tablet Take 20 mg by mouth 2 (two) times daily.       . hydrALAZINE (APRESOLINE) 25 MG tablet Take 25 mg by mouth 3 (three) times daily.       Marland Kitchen HYDROcodone-acetaminophen (LORTAB) 10-500 MG per tablet Take 750 tablets by mouth every 6 (six) hours as needed.       Marland Kitchen JANUVIA 100 MG tablet Take 100 mg by mouth 2 (two) times daily.       Marland Kitchen ketoconazole (NIZORAL) 2 % cream Apply 2 application topically as needed.      Marland Kitchen losartan (COZAAR) 25 MG tablet Take 25 mg by mouth daily.      . metFORMIN (GLUCOPHAGE) 1000 MG tablet Take 1,000 mg by mouth 2 (two) times daily with a meal.      . NOVOLIN N 100 UNIT/ML injection Inject 60 Units as directed daily as needed. Twice daily as directed      . potassium chloride SA (K-DUR,KLOR-CON) 20 MEQ tablet Take 20 mEq by mouth 2 (two) times daily.      . pravastatin (PRAVACHOL) 40 MG tablet Take 40 mg by  mouth daily.      . sodium bicarbonate 650 MG tablet Take 650 mg by mouth 3 (three) times daily.      . Vitamin D, Ergocalciferol, (DRISDOL) 50000 UNITS CAPS Take 50,000 Units by mouth every 7 (seven) days.        Allergies as of 01/28/2012 - Review Complete 01/28/2012  Allergen Reaction Noted  . Macrobid (nitrofurantoin) Nausea And Vomiting 11/04/2011  . Oxycodone  11/04/2011    Family History:There is no known family history of colorectal carcinoma , liver disease, or inflammatory bowel disease.  Problem Relation Age of Onset  . Breast cancer Mother   . Heart disease Father   . Diabetes Father   . Heart disease Brother     History   Social History  . Marital Status: Married    Spouse Name: N/A    Number of Children: N/A  . Years of Education: N/A   Occupational History  . Not on file.   Social History Main Topics  . Smoking status: Never Smoker   . Smokeless tobacco: Never Used  . Alcohol Use: No  . Drug Use: No  . Sexually Active: Not on file  Review of  Systems: Gen: Denies any fever, chills, sweats, anorexia, fatigue, weakness, malaise, weight loss, and sleep disorder CV: Denies chest pain, angina, palpitations, syncope, orthopnea, PND, peripheral edema, and claudication. Resp: Denies dyspnea at rest, dyspnea with exercise, cough, sputum, wheezing, coughing up blood, and pleurisy. GI: Denies vomiting blood, jaundice, and fecal incontinence.   GU : Denies urinary burning, blood in urine, urinary frequency, urinary hesitancy, nocturnal urination, and urinary incontinence. MS: Denies joint pain, limitation of movement, and swelling, stiffness, low back pain, extremity pain. Denies muscle weakness, cramps, atrophy.  Derm: Denies rash, itching, dry skin, hives, moles, warts, or unhealing ulcers.  Psych: Denies depression, anxiety, memory loss, suicidal ideation, hallucinations, paranoia, and confusion. Heme: Denies bruising, bleeding, and enlarged lymph nodes. Neuro:  Denies any headaches, dizziness, paresthesias. Endo:  Denies any problems with DM, thyroid, adrenal function.  Physical Exam: BP 174/70  Pulse 65  Temp 96.5 F (35.8 C) (Temporal)  Ht 5\' 3"  (1.6 m)  Wt 246 lb (111.585 kg)  BMI 43.58 kg/m2 No LMP recorded. Patient is postmenopausal. General:   Alert,  Well-developed, obese, pleasant and cooperative in NAD Head:  Normocephalic and atraumatic. Eyes:  Sclera clear, no icterus.   Conjunctiva pink. Ears:  Normal auditory acuity. Nose:  No deformity, discharge, or lesions. Mouth:  No deformity or lesions,oropharynx pink & moist. Neck:  Supple; no masses or thyromegaly. Lungs:  Clear throughout to auscultation.   No wheezes, crackles, or rhonchi. No acute distress. Heart:  Regular rate and rhythm; no murmurs, clicks, rubs,  or gallops. Abdomen:  Normal bowel sounds.  No bruits.  Soft, non-tender and non-distended without masses, hepatosplenomegaly or hernias noted.  No guarding or rebound tenderness.   Rectal:  Deferred. Msk:   Symmetrical without gross deformities. Normal posture. Pulses:  Normal pulses noted. Extremities:  No clubbing or edema. Neurologic:  Alert and oriented x4;  grossly normal neurologically. Skin:  Intact without significant lesions or rashes. Lymph Nodes:  No significant cervical adenopathy. Psych:  Alert and cooperative. Normal mood and affect.

## 2012-01-28 NOTE — Assessment & Plan Note (Addendum)
Briana [name] is a pleasant 66 y.o. female due for surveillance colonoscopy for hx multiple adenomatous polyps in 2009.  Colonoscopy w/ Dr Darrick Penna.  I have discussed risks & benefits which include, but are not limited to, bleeding, infection, perforation & drug reaction.  The patient agrees with this plan & written consent will be obtained.  Procedure will need to be done with deep sedation (propofol) in the OR under the direction of anesthesia services for multiple psychoactive medications.  Half insulin (30 units) the day prior to the colonoscopy (date of prep) 1/2 of Januvia & Glucophage in morning & evening the day prior to your colonoscopy (date of prep) Hold diabetes medications day of procedure, early morning appt Bring all your medications and/or any insulin to the hospital the day of the procedure. Follow blood sugars, call us or your PCP if any problems.

## 2012-01-29 NOTE — Progress Notes (Signed)
Faxed to PCP

## 2012-02-10 ENCOUNTER — Encounter (HOSPITAL_COMMUNITY): Payer: Self-pay | Admitting: Pharmacy Technician

## 2012-02-18 ENCOUNTER — Encounter (HOSPITAL_COMMUNITY)
Admission: RE | Admit: 2012-02-18 | Discharge: 2012-02-18 | Disposition: A | Discharge status: Home / Self Care | Payer: Medicare Other | Source: Ambulatory Visit | Attending: Gastroenterology | Admitting: Gastroenterology

## 2012-02-18 ENCOUNTER — Encounter (HOSPITAL_COMMUNITY): Payer: Self-pay

## 2012-02-18 ENCOUNTER — Telehealth: Payer: Self-pay | Admitting: Gastroenterology

## 2012-02-18 LAB — BASIC METABOLIC PANEL
CO2: 25 mEq/L (ref 19–32)
Chloride: 98 mEq/L (ref 96–112)
Creatinine, Ser: 1.15 mg/dL — ABNORMAL HIGH (ref 0.50–1.10)
Potassium: 4.6 mEq/L (ref 3.5–5.1)
Sodium: 135 mEq/L (ref 135–145)

## 2012-02-18 LAB — HEMOGLOBIN AND HEMATOCRIT, BLOOD: Hemoglobin: 12.7 g/dL (ref 12.0–15.0)

## 2012-02-18 MED ORDER — SITAGLIPTIN PHOSPHATE 100 MG PO TABS: 100.0000 mg | ORAL_TABLET | Freq: Two times a day (BID) | ORAL | Status: DC

## 2012-02-18 NOTE — Telephone Encounter (Signed)
PLEASE CALL PT.  HER GLUCOSE IS 361. SHE SHOULD FOLLOW A DIABETIC DIET. CONTINUE JANUVIA AND NOVOLOG. BEEN OUT OF JANUVIA FOR 2 MOS. RX FOR JANUVIA SENT. RECHECK FS ON 10/1. FOLLOW UP WITH PCP.

## 2012-02-18 NOTE — Progress Notes (Signed)
Pt notified of blood glucose of 361mg /dl on BMET. Pt states that she has run out of her Januvia and has not been able to get the prescription filled. Dr. Darrick Penna notified and sent prescription electronically through [facility] to pharmacy. Pt aware and will resume med before surgery.

## 2012-02-18 NOTE — Patient Instructions (Signed)
20 Jerilee J Malczewski  02/18/2012   Your procedure is scheduled on:  Tuesday, 02/25/12  [facility] to [facility] at [facility].  Call this number if you have problems the morning of surgery: [phone]   Remember:   Do not eat food:After Midnight.  May have clear liquids:until Midnight .  Clear liquids include soda, tea, black coffee, apple or grape juice, broth.  Take these medicines the morning of surgery with A SIP OF WATER: atenolol, monopril, hydralazine, xanax OR valium if needed, and lortab if needed   Do not wear jewelry, make-up or nail polish.  Do not wear lotions, powders, or perfumes. You may wear deodorant.  Do not shave 48 hours prior to surgery. Men may shave face and neck.  Do not bring valuables to the hospital.  Contacts, dentures or bridgework may not be worn into surgery.  Leave suitcase in the car. After surgery it may be brought to your room.  For patients admitted to the hospital, checkout time is 11:00 AM the day of discharge.   Patients discharged the day of surgery will not be allowed to drive home.  Name and phone number of your driver: family  Special Instructions: Follow diet instructions and prep instructions given to you by Dr. Evelina Dun office.   Please read over the following fact sheets that you were given: Pain Booklet, Anesthesia Post-op Instructions and Care and Recovery After Surgery   Colonoscopy A colonoscopy is an exam to evaluate your entire colon. In this exam, your colon is cleansed. A long fiberoptic tube is inserted through your rectum and into your colon. The fiberoptic scope (endoscope) is a long bundle of enclosed and very flexible fibers. These fibers transmit light to the area examined and send images from that area to your caregiver. Discomfort is usually minimal. You may be given a drug to help you sleep (sedative) during or prior to the procedure. This exam helps to detect lumps (tumors), polyps, inflammation, and areas of bleeding. Your caregiver  may also take a small piece of tissue (biopsy) that will be examined under a microscope. LET YOUR CAREGIVER KNOW ABOUT:   Allergies to food or medicine.   Medicines taken, including vitamins, herbs, eyedrops, over-the-counter medicines, and creams.   Use of steroids (by mouth or creams).   Previous problems with anesthetics or numbing medicines.   History of bleeding problems or blood clots.   Previous surgery.   Other health problems, including diabetes and kidney problems.   Possibility of pregnancy, if this applies.  BEFORE THE PROCEDURE   A clear liquid diet may be required for 2 days before the exam.   Ask your caregiver about changing or stopping your regular medications.   Liquid injections (enemas) or laxatives may be required.   A large amount of electrolyte solution may be given to you to drink over a short period of time. This solution is used to clean out your colon.   You should be present 60 minutes prior to your procedure or as directed by your caregiver.  AFTER THE PROCEDURE   If you received a sedative or pain relieving medication, you will need to arrange for someone to drive you home.   Occasionally, there is a little blood passed with the first bowel movement. Do not be concerned.  FINDING OUT THE RESULTS OF YOUR TEST Not all test results are available during your visit. If your test results are not back during the visit, make an appointment with your caregiver to  find out the results. Do not assume everything is normal if you have not heard from your caregiver or the medical facility. It is important for you to follow up on all of your test results. HOME CARE INSTRUCTIONS   It is not unusual to pass moderate amounts of gas and experience mild abdominal cramping following the procedure. This is due to air being used to inflate your colon during the exam. Walking or a warm pack on your belly (abdomen) may help.   You may resume all normal meals and  activities after sedatives and medicines have worn off.   Only take over-the-counter or prescription medicines for pain, discomfort, or fever as directed by your caregiver. Do not use aspirin or blood thinners if a biopsy was taken. Consult your caregiver for medicine usage if biopsies were taken.  SEEK IMMEDIATE MEDICAL CARE IF:   You have a fever.   You pass large blood clots or fill a toilet with blood following the procedure. This may also occur 10 to 14 days following the procedure. This is more likely if a biopsy was taken.   You develop abdominal pain that keeps getting worse and cannot be relieved with medicine.  Document Released: 05/10/2000 Document Revised: 05/02/2011 Document Reviewed: 12/24/2007 [facility] Patient Information 2012 [place], [place].

## 2012-02-25 ENCOUNTER — Encounter (HOSPITAL_COMMUNITY): Payer: Self-pay | Admitting: Anesthesiology

## 2012-02-25 ENCOUNTER — Encounter (HOSPITAL_COMMUNITY): Payer: Self-pay | Admitting: *Deleted

## 2012-02-25 ENCOUNTER — Ambulatory Visit (HOSPITAL_COMMUNITY): Payer: Medicare Other | Admitting: Anesthesiology

## 2012-02-25 ENCOUNTER — Ambulatory Visit (HOSPITAL_COMMUNITY)
Admission: RE | Admit: 2012-02-25 | Discharge: 2012-02-25 | Disposition: A | Discharge status: Home / Self Care | Payer: Medicare Other | Source: Ambulatory Visit | Attending: Gastroenterology | Admitting: Gastroenterology

## 2012-02-25 ENCOUNTER — Encounter (HOSPITAL_COMMUNITY)
Admission: RE | Disposition: A | Discharge status: Home / Self Care | Payer: Self-pay | Source: Ambulatory Visit | Attending: Gastroenterology

## 2012-02-25 DIAGNOSIS — Z09 Encounter for follow-up examination after completed treatment for conditions other than malignant neoplasm: Secondary | ICD-10-CM | POA: Insufficient documentation

## 2012-02-25 DIAGNOSIS — Z1211 Encounter for screening for malignant neoplasm of colon: Secondary | ICD-10-CM

## 2012-02-25 DIAGNOSIS — E119 Type 2 diabetes mellitus without complications: Secondary | ICD-10-CM | POA: Insufficient documentation

## 2012-02-25 DIAGNOSIS — Z860100 Personal history of colon polyps, unspecified: Secondary | ICD-10-CM | POA: Insufficient documentation

## 2012-02-25 DIAGNOSIS — E785 Hyperlipidemia, unspecified: Secondary | ICD-10-CM | POA: Insufficient documentation

## 2012-02-25 DIAGNOSIS — D126 Benign neoplasm of colon, unspecified: Secondary | ICD-10-CM

## 2012-02-25 DIAGNOSIS — I10 Essential (primary) hypertension: Secondary | ICD-10-CM | POA: Insufficient documentation

## 2012-02-25 DIAGNOSIS — Z01812 Encounter for preprocedural laboratory examination: Secondary | ICD-10-CM | POA: Insufficient documentation

## 2012-02-25 DIAGNOSIS — K648 Other hemorrhoids: Secondary | ICD-10-CM | POA: Insufficient documentation

## 2012-02-25 DIAGNOSIS — Z79899 Other long term (current) drug therapy: Secondary | ICD-10-CM | POA: Insufficient documentation

## 2012-02-25 DIAGNOSIS — Z794 Long term (current) use of insulin: Secondary | ICD-10-CM | POA: Insufficient documentation

## 2012-02-25 HISTORY — PX: POLYPECTOMY: SHX5525

## 2012-02-25 SURGERY — COLONOSCOPY WITH PROPOFOL
Anesthesia: Monitor Anesthesia Care

## 2012-02-25 MED ORDER — ONDANSETRON HCL 4 MG/2ML IJ SOLN
INTRAMUSCULAR | Status: AC
  Filled 2012-02-25: qty 2

## 2012-02-25 MED ORDER — WATER FOR IRRIGATION, STERILE IR SOLN
Status: DC | PRN
  Administered 2012-02-25: 1000 mL

## 2012-02-25 MED ORDER — GLYCOPYRROLATE 0.2 MG/ML IJ SOLN
0.2000 mg | Freq: Once | INTRAMUSCULAR | Status: AC
  Administered 2012-02-25: 0.2 mg via INTRAVENOUS

## 2012-02-25 MED ORDER — MIDAZOLAM HCL 2 MG/2ML IJ SOLN
INTRAMUSCULAR | Status: AC
  Filled 2012-02-25: qty 2

## 2012-02-25 MED ORDER — MIDAZOLAM HCL 5 MG/5ML IJ SOLN
INTRAMUSCULAR | Status: DC | PRN
  Administered 2012-02-25: 2 mg via INTRAVENOUS

## 2012-02-25 MED ORDER — MIDAZOLAM HCL 2 MG/2ML IJ SOLN
1.0000 mg | INTRAMUSCULAR | Status: DC | PRN
  Administered 2012-02-25: 2 mg via INTRAVENOUS

## 2012-02-25 MED ORDER — LIDOCAINE HCL (PF) 1 % IJ SOLN
INTRAMUSCULAR | Status: AC
  Filled 2012-02-25: qty 5

## 2012-02-25 MED ORDER — STERILE WATER FOR IRRIGATION IR SOLN
Status: DC | PRN
  Administered 2012-02-25: 08:00:00

## 2012-02-25 MED ORDER — GLYCOPYRROLATE 0.2 MG/ML IJ SOLN
INTRAMUSCULAR | Status: AC
  Filled 2012-02-25: qty 1

## 2012-02-25 MED ORDER — PROPOFOL 10 MG/ML IV BOLUS
INTRAVENOUS | Status: DC | PRN
  Administered 2012-02-25 (×5): 10 mg via INTRAVENOUS

## 2012-02-25 MED ORDER — LACTATED RINGERS IV SOLN
INTRAVENOUS | Status: DC
  Administered 2012-02-25: 1000 mL via INTRAVENOUS

## 2012-02-25 MED ORDER — PROPOFOL 10 MG/ML IV EMUL
INTRAVENOUS | Status: AC
  Filled 2012-02-25: qty 20

## 2012-02-25 MED ORDER — ONDANSETRON HCL 4 MG/2ML IJ SOLN: 4.0000 mg | Freq: Once | INTRAMUSCULAR | Status: DC | PRN

## 2012-02-25 MED ORDER — FENTANYL CITRATE 0.05 MG/ML IJ SOLN
INTRAMUSCULAR | Status: DC | PRN
  Administered 2012-02-25: 50 ug via INTRAVENOUS

## 2012-02-25 MED ORDER — FENTANYL CITRATE 0.05 MG/ML IJ SOLN
INTRAMUSCULAR | Status: AC
  Filled 2012-02-25: qty 2

## 2012-02-25 MED ORDER — FENTANYL CITRATE 0.05 MG/ML IJ SOLN: 25.0000 ug | INTRAMUSCULAR | Status: DC | PRN

## 2012-02-25 MED ORDER — PROPOFOL INFUSION 10 MG/ML OPTIME
INTRAVENOUS | Status: DC | PRN
  Administered 2012-02-25: 08:00:00 via INTRAVENOUS
  Administered 2012-02-25: 75 ug/kg/min via INTRAVENOUS

## 2012-02-25 MED ORDER — ONDANSETRON HCL 4 MG/2ML IJ SOLN
4.0000 mg | Freq: Once | INTRAMUSCULAR | Status: AC
  Administered 2012-02-25: 4 mg via INTRAVENOUS

## 2012-02-25 SURGICAL SUPPLY — 22 items
ELECT REM PT RETURN 9FT ADLT (ELECTROSURGICAL)
ELECTRODE REM PT RTRN 9FT ADLT (ELECTROSURGICAL) IMPLANT
FCP BXJMBJMB 240X2.8X (CUTTING FORCEPS)
FLOOR PAD 36X40 (MISCELLANEOUS) ×2
FORCEPS BIOP RAD 4 LRG CAP 4 (CUTTING FORCEPS) ×1 IMPLANT
FORCEPS BIOP RJ4 240 W/NDL (CUTTING FORCEPS)
FORCEPS BXJMBJMB 240X2.8X (CUTTING FORCEPS) IMPLANT
INJECTOR/SNARE I SNARE (MISCELLANEOUS) IMPLANT
LUBRICANT JELLY 4.5OZ STERILE (MISCELLANEOUS) ×1 IMPLANT
MANIFOLD NEPTUNE II (INSTRUMENTS) ×1 IMPLANT
NDL SCLEROTHERAPY 25GX240 (NEEDLE) IMPLANT
NEEDLE SCLEROTHERAPY 25GX240 (NEEDLE) IMPLANT
PAD FLOOR 36X40 (MISCELLANEOUS) ×1 IMPLANT
PROBE APC STR FIRE (PROBE) IMPLANT
PROBE INJECTION GOLD (MISCELLANEOUS)
PROBE INJECTION GOLD 7FR (MISCELLANEOUS) IMPLANT
SNARE SHORT THROW 13M SML OVAL (MISCELLANEOUS) ×2 IMPLANT
SYR 50ML LL SCALE MARK (SYRINGE) ×1 IMPLANT
TRAP SPECIMEN MUCOUS 40CC (MISCELLANEOUS) IMPLANT
TUBING ENDO SMARTCAP PENTAX (MISCELLANEOUS) ×1 IMPLANT
TUBING IRRIGATION ENDOGATOR (MISCELLANEOUS) ×1 IMPLANT
WATER STERILE IRR 1000ML POUR (IV SOLUTION) ×2 IMPLANT

## 2012-02-25 NOTE — Anesthesia Preprocedure Evaluation (Signed)
Anesthesia Evaluation  Patient identified by MRN, date of birth, ID band Patient awake    Reviewed: Allergy & Precautions, H&P , NPO status , Patient's Chart, lab work & pertinent test results, reviewed documented beta blocker date and time   History of Anesthesia Complications Negative for: history of anesthetic complications  Airway Mallampati: II      Dental  (+) Edentulous Upper and Edentulous Lower   Pulmonary sleep apnea and Continuous Positive Airway Pressure Ventilation ,  breath sounds clear to auscultation        Cardiovascular hypertension, Pt. on medications - angina+ CAD and + CABG Rhythm:Regular     Neuro/Psych PSYCHIATRIC DISORDERS Anxiety    GI/Hepatic   Endo/Other  diabetes, Well Controlled, Type 2, Oral Hypoglycemic AgentsMorbid obesity  Renal/GU Renal disease     Musculoskeletal   Abdominal   Peds  Hematology   Anesthesia Other Findings   Reproductive/Obstetrics                           Anesthesia Physical Anesthesia Plan  ASA: III  Anesthesia Plan: MAC   Post-op Pain Management:    Induction: Intravenous  Airway Management Planned: Simple Face Mask  Additional Equipment:   Intra-op Plan:   Post-operative Plan:   Informed Consent: I have reviewed the patients History and Physical, chart, labs and discussed the procedure including the risks, benefits and alternatives for the proposed anesthesia with the patient or authorized representative who has indicated his/her understanding and acceptance.     Plan Discussed with:   Anesthesia Plan Comments:         Anesthesia Quick Evaluation

## 2012-02-25 NOTE — Anesthesia Postprocedure Evaluation (Signed)
  Anesthesia Post-op Note  Patient: Briana [name]  Procedure(s) Performed: Procedure(s) (LRB) with comments: COLONOSCOPY WITH PROPOFOL (N/A) - in cecum at 0817; total withdrawal time = 20 minutes POLYPECTOMY (N/A)  Patient Location: PACU  Anesthesia Type: MAC  Level of Consciousness: awake, alert  and oriented  Airway and Oxygen Therapy: Patient Spontanous Breathing  Post-op Pain: none  Post-op Assessment: Post-op Vital signs reviewed, Patient's Cardiovascular Status Stable, Respiratory Function Stable, Patent Airway and No signs of Nausea or vomiting  Post-op Vital Signs: Reviewed and stable  Complications: No apparent anesthesia complications

## 2012-02-25 NOTE — Transfer of Care (Signed)
Immediate Anesthesia Transfer of Care Note  Patient: Briana [name]  Procedure(s) Performed: Procedure(s) (LRB) with comments: COLONOSCOPY WITH PROPOFOL (N/A) - in cecum at 0817; total withdrawal time = 20 minutes POLYPECTOMY (N/A)  Patient Location: PACU  Anesthesia Type: MAC  Level of Consciousness: awake  Airway & Oxygen Therapy: Patient Spontanous Breathing  Post-op Assessment: Report given to PACU RN  Post vital signs: Reviewed  Complications: No apparent anesthesia complications

## 2012-02-25 NOTE — H&P (Signed)
Primary Care Physician:  Reynolds Bowl, MD Primary Gastroenterologist:  Dr. Darrick Penna  Pre-Procedure History & Physical: HPI:  Briana [name] is a 66 y.o. female here for  PERSONAL HISTORY OF POLYPS.   Past Medical History  Diagnosis Date  . Diabetes mellitus   . Hypertension   . Hyperlipidemia   . Heart murmur   . Peripheral vascular disease   . Arthritis   . Depression with anxiety   . Carotid artery occlusion   . Chronic kidney disease   . Neuropathy   . CAD (coronary artery disease)   . Rheumatic fever   . Sleep apnea   . PAD (peripheral artery disease)   . Adenomatous colon polyp 09/2007    Past Surgical History  Procedure Date  . Coronary artery bypass graft 2005  . Carotid endarterectomy 10/04/2009    Right CEA  . Tubal ligation     Prior to Admission medications   Medication Sig Start Date End Date Taking? Authorizing Provider  ALPRAZolam Prudy Feeler) 0.5 MG tablet Take 0.5 mg by mouth 3 (three) times daily as needed. For anxiety   Yes Historical Provider, MD  aspirin 325 MG tablet Take 325 mg by mouth daily.   Yes Historical Provider, MD  atenolol (TENORMIN) 25 MG tablet Take 25 mg by mouth daily.   Yes Historical Provider, MD  cetirizine (ZYRTEC) 10 MG tablet Take 10 mg by mouth daily.    Yes Historical Provider, MD  diazepam (VALIUM) 5 MG tablet Take 5 mg by mouth at bedtime.   Yes Historical Provider, MD  fluticasone (FLONASE) 50 MCG/ACT nasal spray Place 2 sprays into the nose daily.   Yes Historical Provider, MD  furosemide (LASIX) 40 MG tablet Take 20 mg by mouth 2 (two) times daily.    Yes Historical Provider, MD  hydrALAZINE (APRESOLINE) 25 MG tablet Take 25 mg by mouth 3 (three) times daily.   Yes Historical Provider, MD  HYDROcodone-acetaminophen (LORTAB) 7.5-500 MG per tablet Take 1 tablet by mouth every 6 (six) hours as needed. For pain   Yes Historical Provider, MD  NOVOLIN N 100 UNIT/ML injection Inject 60 Units as directed daily as needed. Twice  daily as directed 11/02/11  Yes Historical Provider, MD  potassium chloride SA (K-DUR,KLOR-CON) 20 MEQ tablet Take 20 mEq by mouth 2 (two) times daily.   Yes Historical Provider, MD  pravastatin (PRAVACHOL) 40 MG tablet Take 40 mg by mouth daily. 11/07/11  Yes Historical Provider, MD  sitaGLIPtin (JANUVIA) 100 MG tablet Take 1 tablet (100 mg total) by mouth 2 (two) times daily. 02/18/12  Yes [place], MD  sodium bicarbonate 650 MG tablet Take 650 mg by mouth 3 (three) times daily.   Yes Historical Provider, MD  Vitamin D, Ergocalciferol, (DRISDOL) 50000 UNITS CAPS Take 50,000 Units by mouth every 7 (seven) days.   Yes Historical Provider, MD  fosinopril (MONOPRIL) 40 MG tablet Take 20 mg by mouth 2 (two) times daily.     Historical Provider, MD    Allergies as of 01/28/2012 - Review Complete 01/28/2012  Allergen Reaction Noted  . Macrobid (nitrofurantoin) Nausea And Vomiting 11/04/2011  . Oxycodone  11/04/2011    Family History  Problem Relation Age of Onset  . Breast cancer Mother   . Heart disease Father   . Diabetes Father   . Heart disease Brother     History   Social History  . Marital Status: Married    Spouse Name: N/A    Number  of Children: N/A  . Years of Education: N/A   Occupational History  . Not on file.   Social History Main Topics  . Smoking status: Never Smoker   . Smokeless tobacco: Never Used  . Alcohol Use: No  . Drug Use: No  . Sexually Active: Not on file   Other Topics Concern  . Not on file   Social History Narrative  . No narrative on file    Review of Systems: See HPI, otherwise negative ROS   Physical Exam: BP 148/67  Pulse 74  Temp 97.4 F (36.3 C) (Oral)  Resp 16  SpO2 95% General:   Alert,  pleasant and cooperative in NAD Head:  Normocephalic and atraumatic. Neck:  Supple; Lungs:  Clear throughout to auscultation.    Heart:  Regular rate and rhythm. Abdomen:  Soft, nontender and nondistended. Normal bowel sounds, without  guarding, and without rebound.   Neurologic:  Alert and  oriented x4;  grossly normal neurologically.  Impression/Plan:     PERSONAL HISTORY OF POLYPS.  PLAN: 1. TCS TODAY

## 2012-02-25 NOTE — OR Nursing (Signed)
Bowel prep  Took one half gallon of prep  Took fleets enema this am  Pt states results were clear liquid from the rectum

## 2012-02-25 NOTE — Op Note (Signed)
[facility] [address] [place], [address]   COLONOSCOPY PROCEDURE REPORT  PATIENT: Briana [name], Briana [name].  MR#: [id number] BIRTHDATE: 1946/03/16 , 65  yrs. old GENDER: Female ENDOSCOPIST: Jonette Eva, MD REFERRED XB:JYNWG Jorene Guest, M.D. PROCEDURE DATE:  02/25/2012 PROCEDURE:   Colonoscopy with biopsy INDICATIONS:patient's personal history of colon polyps. MEDICATIONS: MAC sedation, administered by CRNA  DESCRIPTION OF PROCEDURE:    Physical exam was performed.  Informed consent was obtained from the patient after explaining the benefits, risks, and alternatives to procedure.  The patient was connected to monitor and placed in left lateral position. Continuous oxygen was provided by nasal cannula and IV medicine administered through an indwelling cannula.  After administration of sedation and rectal exam, the patients rectum was intubated and the     colonoscope was advanced under direct visualization to the cecum.  The scope was removed slowly by carefully examining the color, texture, anatomy, and integrity mucosa on the way out.  The patient was recovered in endoscopy and discharged home in satisfactory condition.       COLON FINDINGS: Three polyps ranging between 3-80mm in size were found in the ascending colon and transverse colon.  A polypectomy was performed with cold forceps and Small internal hemorrhoids were found.  PREP QUALITY: good. CECAL W/D TIME: 20 minutes  COMPLICATIONS: None  ENDOSCOPIC IMPRESSION: 1.   Three polyps ranging between 3-96mm in size were found in the ascending colon and transverse colon; polypectomy was performed with cold forceps 2.   Small internal hemorrhoids   RECOMMENDATIONS: FOLLOW A HIGH FIBER DIET.  AVOID ITEMS THAT CAUSE BLOATING.  BIOPSY RESULTS SHOULD BE BACK IN 7 DAYS.  Next colonoscopy in 5 years.       _______________________________ Rosalie DoctorJonette Eva, MD 02/25/2012 9:41  AM     PATIENT NAME:  Briana [name] MR#: [id number]

## 2012-02-27 ENCOUNTER — Telehealth: Payer: Self-pay | Admitting: Gastroenterology

## 2012-02-27 NOTE — Telephone Encounter (Signed)
Results faxed to PCP, recall made  

## 2012-02-27 NOTE — Telephone Encounter (Signed)
Pt aware of results 

## 2012-02-27 NOTE — Telephone Encounter (Signed)
Please call pt. She had simple adenomas removed from her colon.     FOLLOW A HIGH FIBER DIET. AVOID ITEMS THAT CAUSE BLOATING.   Next colonoscopy in 5 years.

## 2012-03-02 ENCOUNTER — Encounter (HOSPITAL_COMMUNITY): Payer: Self-pay | Admitting: Gastroenterology

## 2012-12-08 ENCOUNTER — Ambulatory Visit: Payer: Medicare Other | Admitting: Neurosurgery

## 2012-12-08 ENCOUNTER — Other Ambulatory Visit (INDEPENDENT_AMBULATORY_CARE_PROVIDER_SITE_OTHER): Payer: Medicare Other | Admitting: *Deleted

## 2012-12-08 DIAGNOSIS — I6529 Occlusion and stenosis of unspecified carotid artery: Secondary | ICD-10-CM

## 2012-12-08 DIAGNOSIS — Z48812 Encounter for surgical aftercare following surgery on the circulatory system: Secondary | ICD-10-CM

## 2012-12-09 ENCOUNTER — Other Ambulatory Visit: Payer: Self-pay | Admitting: *Deleted

## 2012-12-09 DIAGNOSIS — Z48812 Encounter for surgical aftercare following surgery on the circulatory system: Secondary | ICD-10-CM

## 2012-12-16 ENCOUNTER — Encounter: Payer: Self-pay | Admitting: Vascular Surgery

## 2013-07-13 ENCOUNTER — Other Ambulatory Visit: Payer: Self-pay | Admitting: Vascular Surgery

## 2013-07-13 DIAGNOSIS — Z48812 Encounter for surgical aftercare following surgery on the circulatory system: Secondary | ICD-10-CM

## 2013-07-13 DIAGNOSIS — I6529 Occlusion and stenosis of unspecified carotid artery: Secondary | ICD-10-CM

## 2013-12-13 ENCOUNTER — Encounter: Payer: Self-pay | Admitting: Family

## 2013-12-14 ENCOUNTER — Ambulatory Visit: Payer: Medicare Other | Admitting: Family

## 2013-12-14 ENCOUNTER — Inpatient Hospital Stay (HOSPITAL_COMMUNITY): Admission: RE | Admit: 2013-12-14 | Payer: Medicare Other | Source: Ambulatory Visit

## 2014-02-14 ENCOUNTER — Encounter (HOSPITAL_COMMUNITY): Payer: Self-pay | Admitting: Emergency Medicine

## 2014-02-14 ENCOUNTER — Emergency Department (HOSPITAL_COMMUNITY)
Admission: EM | Admit: 2014-02-14 | Discharge: 2014-02-14 | Disposition: A | Discharge status: Home / Self Care | Payer: Medicare Other | Attending: Emergency Medicine | Admitting: Emergency Medicine

## 2014-02-14 DIAGNOSIS — F341 Dysthymic disorder: Secondary | ICD-10-CM | POA: Diagnosis not present

## 2014-02-14 DIAGNOSIS — Z860100 Personal history of colon polyps, unspecified: Secondary | ICD-10-CM | POA: Insufficient documentation

## 2014-02-14 DIAGNOSIS — Z951 Presence of aortocoronary bypass graft: Secondary | ICD-10-CM | POA: Diagnosis not present

## 2014-02-14 DIAGNOSIS — Z79899 Other long term (current) drug therapy: Secondary | ICD-10-CM | POA: Insufficient documentation

## 2014-02-14 DIAGNOSIS — B029 Zoster without complications: Secondary | ICD-10-CM | POA: Insufficient documentation

## 2014-02-14 DIAGNOSIS — I739 Peripheral vascular disease, unspecified: Secondary | ICD-10-CM | POA: Insufficient documentation

## 2014-02-14 DIAGNOSIS — R011 Cardiac murmur, unspecified: Secondary | ICD-10-CM | POA: Insufficient documentation

## 2014-02-14 DIAGNOSIS — Z794 Long term (current) use of insulin: Secondary | ICD-10-CM | POA: Insufficient documentation

## 2014-02-14 DIAGNOSIS — IMO0002 Reserved for concepts with insufficient information to code with codable children: Secondary | ICD-10-CM | POA: Insufficient documentation

## 2014-02-14 DIAGNOSIS — Z8669 Personal history of other diseases of the nervous system and sense organs: Secondary | ICD-10-CM | POA: Diagnosis not present

## 2014-02-14 DIAGNOSIS — E119 Type 2 diabetes mellitus without complications: Secondary | ICD-10-CM | POA: Diagnosis not present

## 2014-02-14 DIAGNOSIS — N189 Chronic kidney disease, unspecified: Secondary | ICD-10-CM | POA: Insufficient documentation

## 2014-02-14 DIAGNOSIS — M129 Arthropathy, unspecified: Secondary | ICD-10-CM | POA: Diagnosis not present

## 2014-02-14 DIAGNOSIS — I2510 Atherosclerotic heart disease of native coronary artery without angina pectoris: Secondary | ICD-10-CM | POA: Diagnosis not present

## 2014-02-14 DIAGNOSIS — I129 Hypertensive chronic kidney disease with stage 1 through stage 4 chronic kidney disease, or unspecified chronic kidney disease: Secondary | ICD-10-CM | POA: Insufficient documentation

## 2014-02-14 DIAGNOSIS — Z7982 Long term (current) use of aspirin: Secondary | ICD-10-CM | POA: Insufficient documentation

## 2014-02-14 MED ORDER — HYDROCODONE-ACETAMINOPHEN 5-325 MG PO TABS: 1.0000 | ORAL_TABLET | Freq: Four times a day (QID) | ORAL | Status: DC | PRN

## 2014-02-14 MED ORDER — VALACYCLOVIR HCL 1 G PO TABS: 1000.0000 mg | ORAL_TABLET | Freq: Three times a day (TID) | ORAL | Status: AC

## 2014-02-14 NOTE — ED Provider Notes (Signed)
CSN: [id number]     Arrival date & time 02/14/14  1144 History  This chart was scribed for Briana [name], [place] with Briana Diego, MD by Edison Simon, ED Scribe. This patient was seen in room APA10/APA10 and the patient's care was started at 12:10 PM.    Chief Complaint  Patient presents with  . Herpes Zoster   The history is provided by the patient. No language interpreter was used.   HPI Comments: Briana [name] is a 68 y.o. female who presents to the Emergency Department complaining of itching rash to right chest area with onset 2 days ago which she believes is shingles. She reports itching began 2 days ago and states she is currently experiencing pain. She reports a prior case of shingles 18 years ago and states symptoms are similar; she does not remember what medications she took. She states she has had a sinus infection for the past 6 months and states she is scheduled for surgery this Friday.  Past Medical History  Diagnosis Date  . Diabetes mellitus   . Hypertension   . Hyperlipidemia   . Heart murmur   . Peripheral vascular disease   . Arthritis   . Depression with anxiety   . Carotid artery occlusion   . Chronic kidney disease   . Neuropathy   . CAD (coronary artery disease)   . Rheumatic fever   . Sleep apnea   . PAD (peripheral artery disease)   . Adenomatous colon polyp 09/2007   Past Surgical History  Procedure Laterality Date  . Coronary artery bypass graft  2005  . Carotid endarterectomy  10/04/2009    Right CEA  . Tubal ligation    . Polypectomy  02/25/2012    Procedure: POLYPECTOMY;  Surgeon: Danie Binder, MD;  Location: [facility];  Service: Endoscopy;  Laterality: N/A;   Family History  Problem Relation Age of Onset  . Breast cancer Mother   . Heart disease Father   . Diabetes Father   . Heart disease Brother    History  Substance Use Topics  . Smoking status: Never Smoker   . Smokeless tobacco: Never Used  . Alcohol Use: No   OB History   Grav  Para Term Preterm Abortions TAB SAB Ect Mult Living                 Review of Systems  Constitutional: Negative for fever.  Skin: Positive for rash (shingles).  All other systems reviewed and are negative.     Allergies  Macrobid and Oxycodone  Home Medications   Prior to Admission medications   Medication Sig Start Date End Date Taking? Authorizing Provider  ALPRAZolam Duanne Moron) 0.5 MG tablet Take 0.5 mg by mouth 3 (three) times daily as needed. For anxiety    Historical Provider, MD  aspirin 325 MG tablet Take 325 mg by mouth daily.    Historical Provider, MD  atenolol (TENORMIN) 25 MG tablet Take 25 mg by mouth daily.    Historical Provider, MD  cetirizine (ZYRTEC) 10 MG tablet Take 10 mg by mouth daily.     Historical Provider, MD  diazepam (VALIUM) 5 MG tablet Take 5 mg by mouth at bedtime.    Historical Provider, MD  fluticasone (FLONASE) 50 MCG/ACT nasal spray Place 2 sprays into the nose daily.    Historical Provider, MD  fosinopril (MONOPRIL) 40 MG tablet Take 20 mg by mouth 2 (two) times daily.     Historical Provider, MD  furosemide (LASIX) 40 MG tablet Take 20 mg by mouth 2 (two) times daily.     Historical Provider, MD  hydrALAZINE (APRESOLINE) 25 MG tablet Take 25 mg by mouth 3 (three) times daily.    Historical Provider, MD  HYDROcodone-acetaminophen (LORTAB) 7.5-500 MG per tablet Take 1 tablet by mouth every 6 (six) hours as needed. For pain    Historical Provider, MD  NOVOLIN N 100 UNIT/ML injection Inject 60 Units as directed daily as needed. Twice daily as directed 11/02/11   Historical Provider, MD  potassium chloride SA (K-DUR,KLOR-CON) 20 MEQ tablet Take 20 mEq by mouth 2 (two) times daily.    Historical Provider, MD  pravastatin (PRAVACHOL) 40 MG tablet Take 40 mg by mouth daily. 11/07/11   Historical Provider, MD  sitaGLIPtin (JANUVIA) 100 MG tablet Take 1 tablet (100 mg total) by mouth 2 (two) times daily. 02/18/12   Danie Binder, MD  sodium bicarbonate 650 MG  tablet Take 650 mg by mouth 3 (three) times daily.    Historical Provider, MD  Vitamin D, Ergocalciferol, (DRISDOL) 50000 UNITS CAPS Take 50,000 Units by mouth every 7 (seven) days.    Historical Provider, MD   BP 181/57  Pulse 72  Temp(Src) 98.9 F (37.2 C) (Oral)  Ht 5\' 4"  (1.626 m)  Wt 247 lb (112.038 kg)  BMI 42.38 kg/m2  SpO2 94% Physical Exam  Nursing note and vitals reviewed. Constitutional: She is oriented to person, place, and time. She appears well-developed and well-nourished.  HENT:  Head: Normocephalic and atraumatic.  Eyes: Conjunctivae are normal.  Neck: Normal range of motion. Neck supple.  Pulmonary/Chest: Effort normal.  Musculoskeletal: Normal range of motion.  Neurological: She is alert and oriented to person, place, and time.  Skin: Skin is warm and dry. Rash (vesicular rash with erythematous base to right lower chest wall) noted.  Psychiatric: She has a normal mood and affect.    ED Course  Procedures (including critical care time) Labs Review Labs Reviewed - No data to display  Imaging Review No results found.   EKG Interpretation None     DIAGNOSTIC STUDIES: Oxygen Saturation is 94% on room air, adequate by my interpretation.    COORDINATION OF CARE: 12:16 PM Discussed treatment plan with patient at beside, the patient agrees with the plan and has no further questions at this time.    MDM   Final diagnoses:  Shingles   Will treat with valtrex and percocet. No rash to face  I personally performed the services described in this documentation, which was scribed in my presence. The recorded information has been reviewed and is accurate.      Briana Docker, NP 02/14/14 1234

## 2014-02-14 NOTE — ED Notes (Signed)
Pt c/o shingles rash to right rib cage x 3 days. Blisters noted. H/s shingles.

## 2014-02-14 NOTE — Discharge Instructions (Signed)

## 2014-02-15 NOTE — ED Provider Notes (Signed)
Medical screening examination/treatment/procedure(s) were performed by non-physician practitioner and as supervising physician I was immediately available for consultation/collaboration.   EKG Interpretation None        Maudry Diego, MD 02/15/14 1255

## 2014-03-02 ENCOUNTER — Emergency Department (HOSPITAL_COMMUNITY): Payer: Medicare Other

## 2014-03-02 ENCOUNTER — Encounter (HOSPITAL_COMMUNITY): Payer: Self-pay | Admitting: Emergency Medicine

## 2014-03-02 ENCOUNTER — Inpatient Hospital Stay (HOSPITAL_COMMUNITY)
Admission: EM | Admit: 2014-03-02 | Discharge: 2014-03-05 | DRG: 689 | Discharge status: Against Medical Advice | Payer: Medicare Other | Attending: Internal Medicine | Admitting: Internal Medicine

## 2014-03-02 DIAGNOSIS — F22 Delusional disorders: Secondary | ICD-10-CM | POA: Diagnosis present

## 2014-03-02 DIAGNOSIS — E1122 Type 2 diabetes mellitus with diabetic chronic kidney disease: Secondary | ICD-10-CM

## 2014-03-02 DIAGNOSIS — G4730 Sleep apnea, unspecified: Secondary | ICD-10-CM | POA: Diagnosis present

## 2014-03-02 DIAGNOSIS — Z7982 Long term (current) use of aspirin: Secondary | ICD-10-CM | POA: Diagnosis not present

## 2014-03-02 DIAGNOSIS — Z951 Presence of aortocoronary bypass graft: Secondary | ICD-10-CM | POA: Diagnosis not present

## 2014-03-02 DIAGNOSIS — Z833 Family history of diabetes mellitus: Secondary | ICD-10-CM | POA: Diagnosis not present

## 2014-03-02 DIAGNOSIS — Z86010 Personal history of colonic polyps: Secondary | ICD-10-CM

## 2014-03-02 DIAGNOSIS — R4182 Altered mental status, unspecified: Secondary | ICD-10-CM | POA: Diagnosis not present

## 2014-03-02 DIAGNOSIS — N390 Urinary tract infection, site not specified: Secondary | ICD-10-CM | POA: Diagnosis present

## 2014-03-02 DIAGNOSIS — Z803 Family history of malignant neoplasm of breast: Secondary | ICD-10-CM

## 2014-03-02 DIAGNOSIS — M1990 Unspecified osteoarthritis, unspecified site: Secondary | ICD-10-CM | POA: Diagnosis present

## 2014-03-02 DIAGNOSIS — R509 Fever, unspecified: Secondary | ICD-10-CM

## 2014-03-02 DIAGNOSIS — I10 Essential (primary) hypertension: Secondary | ICD-10-CM

## 2014-03-02 DIAGNOSIS — Z6841 Body Mass Index (BMI) 40.0 and over, adult: Secondary | ICD-10-CM

## 2014-03-02 DIAGNOSIS — I129 Hypertensive chronic kidney disease with stage 1 through stage 4 chronic kidney disease, or unspecified chronic kidney disease: Secondary | ICD-10-CM | POA: Diagnosis present

## 2014-03-02 DIAGNOSIS — F329 Major depressive disorder, single episode, unspecified: Secondary | ICD-10-CM

## 2014-03-02 DIAGNOSIS — E785 Hyperlipidemia, unspecified: Secondary | ICD-10-CM | POA: Diagnosis not present

## 2014-03-02 DIAGNOSIS — B029 Zoster without complications: Secondary | ICD-10-CM | POA: Diagnosis not present

## 2014-03-02 DIAGNOSIS — I739 Peripheral vascular disease, unspecified: Secondary | ICD-10-CM | POA: Diagnosis present

## 2014-03-02 DIAGNOSIS — Z8249 Family history of ischemic heart disease and other diseases of the circulatory system: Secondary | ICD-10-CM | POA: Diagnosis not present

## 2014-03-02 DIAGNOSIS — Z79891 Long term (current) use of opiate analgesic: Secondary | ICD-10-CM

## 2014-03-02 DIAGNOSIS — Z794 Long term (current) use of insulin: Secondary | ICD-10-CM

## 2014-03-02 DIAGNOSIS — I471 Supraventricular tachycardia: Secondary | ICD-10-CM | POA: Diagnosis not present

## 2014-03-02 DIAGNOSIS — G9340 Encephalopathy, unspecified: Secondary | ICD-10-CM | POA: Diagnosis present

## 2014-03-02 DIAGNOSIS — E669 Obesity, unspecified: Secondary | ICD-10-CM | POA: Diagnosis present

## 2014-03-02 DIAGNOSIS — N183 Chronic kidney disease, stage 3 unspecified: Secondary | ICD-10-CM | POA: Diagnosis present

## 2014-03-02 DIAGNOSIS — N179 Acute kidney failure, unspecified: Secondary | ICD-10-CM | POA: Diagnosis not present

## 2014-03-02 DIAGNOSIS — I071 Rheumatic tricuspid insufficiency: Secondary | ICD-10-CM | POA: Diagnosis not present

## 2014-03-02 DIAGNOSIS — Z79899 Other long term (current) drug therapy: Secondary | ICD-10-CM | POA: Diagnosis not present

## 2014-03-02 DIAGNOSIS — I2510 Atherosclerotic heart disease of native coronary artery without angina pectoris: Secondary | ICD-10-CM | POA: Diagnosis not present

## 2014-03-02 DIAGNOSIS — R4589 Other symptoms and signs involving emotional state: Secondary | ICD-10-CM

## 2014-03-02 DIAGNOSIS — E119 Type 2 diabetes mellitus without complications: Secondary | ICD-10-CM | POA: Diagnosis not present

## 2014-03-02 DIAGNOSIS — R0902 Hypoxemia: Secondary | ICD-10-CM

## 2014-03-02 DIAGNOSIS — Z860101 Personal history of adenomatous and serrated colon polyps: Secondary | ICD-10-CM

## 2014-03-02 DIAGNOSIS — E860 Dehydration: Secondary | ICD-10-CM | POA: Diagnosis present

## 2014-03-02 DIAGNOSIS — R531 Weakness: Secondary | ICD-10-CM

## 2014-03-02 LAB — CBC WITH DIFFERENTIAL/PLATELET
BASOS PCT: 1 % (ref 0–1)
Basophils Absolute: 0 10*3/uL (ref 0.0–0.1)
EOS ABS: 0 10*3/uL (ref 0.0–0.7)
Eosinophils Relative: 0 % (ref 0–5)
HEMATOCRIT: 36.1 % (ref 36.0–46.0)
HEMOGLOBIN: 11.6 g/dL — AB (ref 12.0–15.0)
Lymphocytes Relative: 11 % — ABNORMAL LOW (ref 12–46)
Lymphs Abs: 1 10*3/uL (ref 0.7–4.0)
MCH: 26.1 pg (ref 26.0–34.0)
MCHC: 32.1 g/dL (ref 30.0–36.0)
MCV: 81.3 fL (ref 78.0–100.0)
MONO ABS: 0.8 10*3/uL (ref 0.1–1.0)
MONOS PCT: 9 % (ref 3–12)
Neutro Abs: 7 10*3/uL (ref 1.7–7.7)
Neutrophils Relative %: 79 % — ABNORMAL HIGH (ref 43–77)
Platelets: 204 10*3/uL (ref 150–400)
RBC: 4.44 MIL/uL (ref 3.87–5.11)
RDW: 16.3 % — ABNORMAL HIGH (ref 11.5–15.5)
WBC: 8.8 10*3/uL (ref 4.0–10.5)

## 2014-03-02 LAB — COMPREHENSIVE METABOLIC PANEL
ALBUMIN: 2.7 g/dL — AB (ref 3.5–5.2)
ALK PHOS: 82 U/L (ref 39–117)
ALT: 7 U/L (ref 0–35)
AST: 14 U/L (ref 0–37)
Anion gap: 14 (ref 5–15)
BILIRUBIN TOTAL: 0.5 mg/dL (ref 0.3–1.2)
BUN: 33 mg/dL — AB (ref 6–23)
CHLORIDE: 98 meq/L (ref 96–112)
CO2: 21 mEq/L (ref 19–32)
Calcium: 8.3 mg/dL — ABNORMAL LOW (ref 8.4–10.5)
Creatinine, Ser: 1.52 mg/dL — ABNORMAL HIGH (ref 0.50–1.10)
GFR calc Af Amer: 39 mL/min — ABNORMAL LOW (ref >90)
GFR calc non Af Amer: 34 mL/min — ABNORMAL LOW (ref >90)
Glucose, Bld: 299 mg/dL — ABNORMAL HIGH (ref 70–99)
POTASSIUM: 4.7 meq/L (ref 3.7–5.3)
Sodium: 133 mEq/L — ABNORMAL LOW (ref 137–147)
Total Protein: 6.9 g/dL (ref 6.0–8.3)

## 2014-03-02 LAB — URINALYSIS, ROUTINE W REFLEX MICROSCOPIC
BILIRUBIN URINE: NEGATIVE
Glucose, UA: 100 mg/dL — AB
Ketones, ur: NEGATIVE mg/dL
Leukocytes, UA: NEGATIVE
Nitrite: NEGATIVE
SPECIFIC GRAVITY, URINE: 1.02 (ref 1.005–1.030)
UROBILINOGEN UA: 0.2 mg/dL (ref 0.0–1.0)
pH: 6 (ref 5.0–8.0)

## 2014-03-02 LAB — LIPASE, BLOOD: Lipase: 10 U/L — ABNORMAL LOW (ref 11–59)

## 2014-03-02 LAB — GLUCOSE, CAPILLARY: GLUCOSE-CAPILLARY: 255 mg/dL — AB (ref 70–99)

## 2014-03-02 LAB — URINE MICROSCOPIC-ADD ON

## 2014-03-02 LAB — TROPONIN I

## 2014-03-02 LAB — PRO B NATRIURETIC PEPTIDE: Pro B Natriuretic peptide (BNP): 6284 pg/mL — ABNORMAL HIGH (ref 0–125)

## 2014-03-02 LAB — LACTIC ACID, PLASMA: Lactic Acid, Venous: 1 mmol/L (ref 0.5–2.2)

## 2014-03-02 MED ORDER — ONDANSETRON HCL 4 MG/2ML IJ SOLN: 4.0000 mg | Freq: Four times a day (QID) | INTRAMUSCULAR | Status: DC | PRN

## 2014-03-02 MED ORDER — ALPRAZOLAM 0.5 MG PO TABS: 0.5000 mg | ORAL_TABLET | Freq: Three times a day (TID) | ORAL | Status: DC | PRN

## 2014-03-02 MED ORDER — MORPHINE SULFATE 2 MG/ML IJ SOLN: 1.0000 mg | INTRAMUSCULAR | Status: DC | PRN

## 2014-03-02 MED ORDER — DEXTROSE 5 % IV SOLN
1.0000 g | Freq: Once | INTRAVENOUS | Status: AC
  Administered 2014-03-02: 1 g via INTRAVENOUS
  Filled 2014-03-02: qty 10

## 2014-03-02 MED ORDER — SODIUM CHLORIDE 0.9 % IJ SOLN
3.0000 mL | Freq: Two times a day (BID) | INTRAMUSCULAR | Status: DC
  Administered 2014-03-02 – 2014-03-05 (×4): 3 mL via INTRAVENOUS

## 2014-03-02 MED ORDER — SODIUM CHLORIDE 0.9 % IJ SOLN
3.0000 mL | Freq: Two times a day (BID) | INTRAMUSCULAR | Status: DC
  Administered 2014-03-02 – 2014-03-04 (×3): 3 mL via INTRAVENOUS

## 2014-03-02 MED ORDER — VANCOMYCIN HCL 10 G IV SOLR
2000.0000 mg | Freq: Once | INTRAVENOUS | Status: AC
  Filled 2014-03-02: qty 2000

## 2014-03-02 MED ORDER — SODIUM CHLORIDE 0.9 % IV SOLN
INTRAVENOUS | Status: DC
  Administered 2014-03-02: 20:00:00 via INTRAVENOUS

## 2014-03-02 MED ORDER — VANCOMYCIN HCL IN DEXTROSE 1-5 GM/200ML-% IV SOLN
INTRAVENOUS | Status: AC
  Administered 2014-03-02: 1 g
  Filled 2014-03-02: qty 400

## 2014-03-02 MED ORDER — SODIUM CHLORIDE 0.9 % IV BOLUS (SEPSIS)
500.0000 mL | Freq: Once | INTRAVENOUS | Status: AC
  Administered 2014-03-02: 500 mL via INTRAVENOUS

## 2014-03-02 MED ORDER — PIPERACILLIN-TAZOBACTAM 3.375 G IVPB
3.3750 g | Freq: Three times a day (TID) | INTRAVENOUS | Status: DC
  Administered 2014-03-03 – 2014-03-05 (×8): 3.375 g via INTRAVENOUS
  Filled 2014-03-02 (×14): qty 50

## 2014-03-02 MED ORDER — SODIUM CHLORIDE 0.9 % IV SOLN
250.0000 mL | INTRAVENOUS | Status: DC | PRN
  Administered 2014-03-02: 250 mL via INTRAVENOUS

## 2014-03-02 MED ORDER — ACETAMINOPHEN 650 MG RE SUPP
650.0000 mg | Freq: Four times a day (QID) | RECTAL | Status: DC | PRN
  Administered 2014-03-03: 650 mg via RECTAL

## 2014-03-02 MED ORDER — ACETAMINOPHEN 500 MG PO TABS
1000.0000 mg | ORAL_TABLET | Freq: Once | ORAL | Status: AC
  Administered 2014-03-02: 1000 mg via ORAL
  Filled 2014-03-02: qty 2

## 2014-03-02 MED ORDER — PRAVASTATIN SODIUM 40 MG PO TABS
40.0000 mg | ORAL_TABLET | Freq: Every day | ORAL | Status: DC
  Administered 2014-03-02 – 2014-03-05 (×4): 40 mg via ORAL
  Filled 2014-03-02 (×4): qty 1

## 2014-03-02 MED ORDER — ACETAMINOPHEN 325 MG PO TABS
650.0000 mg | ORAL_TABLET | Freq: Four times a day (QID) | ORAL | Status: DC | PRN
Start: 2014-03-02 — End: 2014-03-05
  Filled 2014-03-02: qty 2

## 2014-03-02 MED ORDER — SODIUM CHLORIDE 0.9 % IV SOLN
1500.0000 mg | INTRAVENOUS | Status: DC
  Administered 2014-03-03: 1500 mg via INTRAVENOUS
  Filled 2014-03-02 (×4): qty 1500

## 2014-03-02 MED ORDER — SODIUM CHLORIDE 0.9 % IJ SOLN: 3.0000 mL | INTRAMUSCULAR | Status: DC | PRN

## 2014-03-02 MED ORDER — HEPARIN SODIUM (PORCINE) 5000 UNIT/ML IJ SOLN
5000.0000 [IU] | Freq: Three times a day (TID) | INTRAMUSCULAR | Status: DC
  Administered 2014-03-02 – 2014-03-05 (×9): 5000 [IU] via SUBCUTANEOUS
  Filled 2014-03-02 (×9): qty 1

## 2014-03-02 MED ORDER — ASPIRIN 325 MG PO TABS
325.0000 mg | ORAL_TABLET | Freq: Every day | ORAL | Status: DC
  Administered 2014-03-02 – 2014-03-05 (×4): 325 mg via ORAL
  Filled 2014-03-02 (×4): qty 1

## 2014-03-02 MED ORDER — LORAZEPAM 2 MG/ML IJ SOLN
1.0000 mg | Freq: Once | INTRAMUSCULAR | Status: AC
  Administered 2014-03-02: 1 mg via INTRAVENOUS
  Filled 2014-03-02: qty 1

## 2014-03-02 MED ORDER — INSULIN ASPART 100 UNIT/ML ~~LOC~~ SOLN
0.0000 [IU] | Freq: Three times a day (TID) | SUBCUTANEOUS | Status: DC
  Administered 2014-03-03: 3 [IU] via SUBCUTANEOUS
  Administered 2014-03-03: 5 [IU] via SUBCUTANEOUS
  Administered 2014-03-03: 8 [IU] via SUBCUTANEOUS
  Administered 2014-03-04: 2 [IU] via SUBCUTANEOUS
  Administered 2014-03-04: 3 [IU] via SUBCUTANEOUS
  Administered 2014-03-04 – 2014-03-05 (×2): 5 [IU] via SUBCUTANEOUS
  Administered 2014-03-05: 3 [IU] via SUBCUTANEOUS

## 2014-03-02 MED ORDER — PIPERACILLIN-TAZOBACTAM 3.375 G IVPB
INTRAVENOUS | Status: AC
  Filled 2014-03-02: qty 50

## 2014-03-02 MED ORDER — ATENOLOL 25 MG PO TABS
25.0000 mg | ORAL_TABLET | Freq: Every day | ORAL | Status: DC
  Administered 2014-03-02 – 2014-03-03 (×2): 25 mg via ORAL
  Filled 2014-03-02 (×2): qty 1

## 2014-03-02 MED ORDER — METOPROLOL TARTRATE 1 MG/ML IV SOLN
5.0000 mg | Freq: Once | INTRAVENOUS | Status: DC
  Filled 2014-03-02: qty 5

## 2014-03-02 MED ORDER — SODIUM CHLORIDE 0.9 % IV SOLN
INTRAVENOUS | Status: DC
  Administered 2014-03-02: 21:00:00 via INTRAVENOUS

## 2014-03-02 MED ORDER — VANCOMYCIN HCL IN DEXTROSE 1-5 GM/200ML-% IV SOLN: 1000.0000 mg | Freq: Once | INTRAVENOUS | Status: DC

## 2014-03-02 MED ORDER — INFLUENZA VAC SPLIT QUAD 0.5 ML IM SUSY
0.5000 mL | PREFILLED_SYRINGE | INTRAMUSCULAR | Status: AC
  Administered 2014-03-03: 0.5 mL via INTRAMUSCULAR
  Filled 2014-03-02: qty 0.5

## 2014-03-02 MED ORDER — ADENOSINE 6 MG/2ML IV SOLN: 6.0000 mg | Freq: Once | INTRAVENOUS | Status: DC

## 2014-03-02 MED ORDER — PIPERACILLIN-TAZOBACTAM 3.375 G IVPB 30 MIN
3.3750 g | Freq: Once | INTRAVENOUS | Status: AC
  Administered 2014-03-02: 3.375 g via INTRAVENOUS
  Filled 2014-03-02: qty 50

## 2014-03-02 MED ORDER — GABAPENTIN 300 MG PO CAPS
300.0000 mg | ORAL_CAPSULE | Freq: Two times a day (BID) | ORAL | Status: DC
  Administered 2014-03-02 – 2014-03-05 (×6): 300 mg via ORAL
  Filled 2014-03-02 (×6): qty 1

## 2014-03-02 MED ORDER — INSULIN ASPART 100 UNIT/ML ~~LOC~~ SOLN
0.0000 [IU] | Freq: Every day | SUBCUTANEOUS | Status: DC
  Administered 2014-03-02: 3 [IU] via SUBCUTANEOUS
  Administered 2014-03-04: 2 [IU] via SUBCUTANEOUS

## 2014-03-02 MED ORDER — SERTRALINE HCL 50 MG PO TABS
100.0000 mg | ORAL_TABLET | Freq: Every day | ORAL | Status: DC
  Administered 2014-03-02 – 2014-03-05 (×4): 100 mg via ORAL
  Filled 2014-03-02 (×4): qty 2

## 2014-03-02 MED ORDER — SODIUM CHLORIDE 0.9 % IV BOLUS (SEPSIS)
250.0000 mL | Freq: Once | INTRAVENOUS | Status: AC
Start: 2014-03-02 — End: 2014-03-02
  Administered 2014-03-02: 250 mL via INTRAVENOUS

## 2014-03-02 MED ORDER — ONDANSETRON HCL 4 MG PO TABS
4.0000 mg | ORAL_TABLET | Freq: Four times a day (QID) | ORAL | Status: DC | PRN
Start: 2014-03-02 — End: 2014-03-05

## 2014-03-02 NOTE — ED Provider Notes (Addendum)
CSN: [id number]     Arrival date & time 03/02/14  1142 History  This chart was scribed for Fredia Sorrow, MD by Molli Posey, ED Scribe. This patient was seen in room APA15/APA15 and the patient's care was started 1:26 PM.    Chief Complaint  Patient presents with  . Dizziness     The history is provided by the patient. No language interpreter was used.   HPI Comments: Briana [name] is a 68 y.o. female who presents to the Emergency Department complaining of generalized weakness for the past several months. She states she has had trouble walking recently and reports a PMHx of neuropathy. She reports she has been shaking and has intermittent fevers and chills. The patient states she has a current outbreak of shingles along her right side under the breast and states they're scabbing  PCP [facility]     Past Medical History  Diagnosis Date  . Diabetes mellitus   . Hypertension   . Hyperlipidemia   . Heart murmur   . Peripheral vascular disease   . Arthritis   . Depression with anxiety   . Carotid artery occlusion   . Chronic kidney disease   . Neuropathy   . CAD (coronary artery disease)   . Rheumatic fever   . Sleep apnea   . PAD (peripheral artery disease)   . Adenomatous colon polyp 09/2007   Past Surgical History  Procedure Laterality Date  . Coronary artery bypass graft  2005  . Carotid endarterectomy  10/04/2009    Right CEA  . Tubal ligation    . Polypectomy  02/25/2012    Procedure: POLYPECTOMY;  Surgeon: Danie Binder, MD;  Location: [facility];  Service: Endoscopy;  Laterality: N/A;   Family History  Problem Relation Age of Onset  . Breast cancer Mother   . Heart disease Father   . Diabetes Father   . Heart disease Brother    History  Substance Use Topics  . Smoking status: Never Smoker   . Smokeless tobacco: Never Used  . Alcohol Use: No   OB History   Grav Para Term Preterm Abortions TAB SAB Ect Mult Living                 Review  of Systems  Constitutional: Positive for fever and chills.  HENT: Negative for rhinorrhea and sore throat.   Eyes: Negative for visual disturbance.  Respiratory: Negative for cough and shortness of breath.   Cardiovascular: Negative for chest pain and leg swelling.  Gastrointestinal: Negative for nausea, vomiting, abdominal pain and diarrhea.  Genitourinary: Positive for dysuria.  Musculoskeletal: Positive for myalgias. Negative for back pain and neck pain.  Skin: Positive for rash.  Neurological: Positive for headaches.  Hematological: Does not bruise/bleed easily.  Psychiatric/Behavioral: Negative for confusion.      Allergies  Macrobid and Oxycodone  Home Medications   Prior to Admission medications   Medication Sig Start Date End Date Taking? Authorizing Provider  ALPRAZolam Duanne Moron) 0.5 MG tablet Take 0.5 mg by mouth 3 (three) times daily as needed. For anxiety    Historical Provider, MD  aspirin 325 MG tablet Take 325 mg by mouth daily.    Historical Provider, MD  atenolol (TENORMIN) 25 MG tablet Take 25 mg by mouth daily.    Historical Provider, MD  cetirizine (ZYRTEC) 10 MG tablet Take 10 mg by mouth daily.     Historical Provider, MD  diazepam (VALIUM) 5 MG tablet Take 5  mg by mouth at bedtime.    Historical Provider, MD  fluticasone (FLONASE) 50 MCG/ACT nasal spray Place 2 sprays into the nose daily.    Historical Provider, MD  fosinopril (MONOPRIL) 40 MG tablet Take 20 mg by mouth 2 (two) times daily.     Historical Provider, MD  furosemide (LASIX) 40 MG tablet Take 20 mg by mouth 2 (two) times daily.     Historical Provider, MD  hydrALAZINE (APRESOLINE) 25 MG tablet Take 25 mg by mouth 3 (three) times daily.    Historical Provider, MD  HYDROcodone-acetaminophen (LORTAB) 7.5-500 MG per tablet Take 1 tablet by mouth every 6 (six) hours as needed. For pain    Historical Provider, MD  HYDROcodone-acetaminophen (NORCO/VICODIN) 5-325 MG per tablet Take 1-2 tablets by mouth  every 6 (six) hours as needed. 02/14/14   [name], NP  NOVOLIN N 100 UNIT/ML injection Inject 60 Units as directed daily as needed. Twice daily as directed 11/02/11   Historical Provider, MD  potassium chloride SA (K-DUR,KLOR-CON) 20 MEQ tablet Take 20 mEq by mouth 2 (two) times daily.    Historical Provider, MD  pravastatin (PRAVACHOL) 40 MG tablet Take 40 mg by mouth daily. 11/07/11   Historical Provider, MD  sitaGLIPtin (JANUVIA) 100 MG tablet Take 1 tablet (100 mg total) by mouth 2 (two) times daily. 02/18/12   Danie Binder, MD  sodium bicarbonate 650 MG tablet Take 650 mg by mouth 3 (three) times daily.    Historical Provider, MD  Vitamin D, Ergocalciferol, (DRISDOL) 50000 UNITS CAPS Take 50,000 Units by mouth every 7 (seven) days.    Historical Provider, MD   BP 189/54  Pulse 81  Temp(Src) 102.7 F (39.3 C) (Oral)  Resp 24  SpO2 91% Physical Exam  Nursing note and vitals reviewed. Constitutional: She is oriented to person, place, and time. She appears well-developed and well-nourished.  HENT:  Head: Atraumatic.  Neck: No tracheal deviation present.  Cardiovascular: Normal rate, regular rhythm and normal heart sounds.   Pulmonary/Chest: Effort normal and breath sounds normal. No respiratory distress. She has no wheezes. She has no rales.  Abdominal: Soft. Bowel sounds are normal.  Musculoskeletal: Normal range of motion. She exhibits no edema.  Neurological: She is alert and oriented to person, place, and time. No cranial nerve deficit. She exhibits normal muscle tone. Coordination normal.  Heel to shin coordination bilaterally not very accurate   Skin: Rash noted.  Scabbing vesicular consistent with healing zoster rash with red base Around T7 dermatome     ED Course  Procedures  DIAGNOSTIC STUDIES: Oxygen Saturation is 98% on RA, normal by my interpretation.    COORDINATION OF CARE: 1:39 PM Discussed treatment plan with pt at bedside and pt agreed to plan.   Labs  Review Labs Reviewed  COMPREHENSIVE METABOLIC PANEL - Abnormal; Notable for the following:    Sodium 133 (*)    Glucose, Bld 299 (*)    BUN 33 (*)    Creatinine, Ser 1.52 (*)    Calcium 8.3 (*)    Albumin 2.7 (*)    GFR calc non Af Amer 34 (*)    GFR calc Af Amer 39 (*)    All other components within normal limits  LIPASE, BLOOD - Abnormal; Notable for the following:    Lipase 10 (*)    All other components within normal limits  CBC WITH DIFFERENTIAL - Abnormal; Notable for the following:    Hemoglobin 11.6 (*)    RDW  16.3 (*)    Neutrophils Relative % 79 (*)    Lymphocytes Relative 11 (*)    All other components within normal limits  URINALYSIS, ROUTINE W REFLEX MICROSCOPIC - Abnormal; Notable for the following:    Glucose, UA 100 (*)    Hgb urine dipstick MODERATE (*)    Protein, ur >300 (*)    All other components within normal limits  URINE MICROSCOPIC-ADD ON - Abnormal; Notable for the following:    Squamous Epithelial / LPF FEW (*)    Bacteria, UA FEW (*)    All other components within normal limits  URINE CULTURE  CULTURE, BLOOD (ROUTINE X 2)  CULTURE, BLOOD (ROUTINE X 2)  TROPONIN I  LACTIC ACID, PLASMA  PRO B NATRIURETIC PEPTIDE   Results for orders placed during the hospital encounter of 03/02/14  TROPONIN I      Result Value Ref Range   Troponin I <0.30  <0.30 ng/mL  COMPREHENSIVE METABOLIC PANEL      Result Value Ref Range   Sodium 133 (*) 137 - 147 mEq/L   Potassium 4.7  3.7 - 5.3 mEq/L   Chloride 98  96 - 112 mEq/L   CO2 21  19 - 32 mEq/L   Glucose, Bld 299 (*) 70 - 99 mg/dL   BUN 33 (*) 6 - 23 mg/dL   Creatinine, Ser 1.52 (*) 0.50 - 1.10 mg/dL   Calcium 8.3 (*) 8.4 - 10.5 mg/dL   Total Protein 6.9  6.0 - 8.3 g/dL   Albumin 2.7 (*) 3.5 - 5.2 g/dL   AST 14  0 - 37 U/L   ALT 7  0 - 35 U/L   Alkaline Phosphatase 82  39 - 117 U/L   Total Bilirubin 0.5  0.3 - 1.2 mg/dL   GFR calc non Af Amer 34 (*) >90 mL/min   GFR calc Af Amer 39 (*) >90 mL/min    Anion gap 14  5 - 15  LIPASE, BLOOD      Result Value Ref Range   Lipase 10 (*) 11 - 59 U/L  CBC WITH DIFFERENTIAL      Result Value Ref Range   WBC 8.8  4.0 - 10.5 K/uL   RBC 4.44  3.87 - 5.11 MIL/uL   Hemoglobin 11.6 (*) 12.0 - 15.0 g/dL   HCT 36.1  36.0 - 46.0 %   MCV 81.3  78.0 - 100.0 fL   MCH 26.1  26.0 - 34.0 pg   MCHC 32.1  30.0 - 36.0 g/dL   RDW 16.3 (*) 11.5 - 15.5 %   Platelets 204  150 - 400 K/uL   Neutrophils Relative % 79 (*) 43 - 77 %   Neutro Abs 7.0  1.7 - 7.7 K/uL   Lymphocytes Relative 11 (*) 12 - 46 %   Lymphs Abs 1.0  0.7 - 4.0 K/uL   Monocytes Relative 9  3 - 12 %   Monocytes Absolute 0.8  0.1 - 1.0 K/uL   Eosinophils Relative 0  0 - 5 %   Eosinophils Absolute 0.0  0.0 - 0.7 K/uL   Basophils Relative 1  0 - 1 %   Basophils Absolute 0.0  0.0 - 0.1 K/uL  URINALYSIS, ROUTINE W REFLEX MICROSCOPIC      Result Value Ref Range   Color, Urine YELLOW  YELLOW   APPearance CLEAR  CLEAR   Specific Gravity, Urine 1.020  1.005 - 1.030   pH 6.0  5.0 - 8.0  Glucose, UA 100 (*) NEGATIVE mg/dL   Hgb urine dipstick MODERATE (*) NEGATIVE   Bilirubin Urine NEGATIVE  NEGATIVE   Ketones, ur NEGATIVE  NEGATIVE mg/dL   Protein, ur >300 (*) NEGATIVE mg/dL   Urobilinogen, UA 0.2  0.0 - 1.0 mg/dL   Nitrite NEGATIVE  NEGATIVE   Leukocytes, UA NEGATIVE  NEGATIVE  URINE MICROSCOPIC-ADD ON      Result Value Ref Range   Squamous Epithelial / LPF FEW (*) RARE   WBC, UA TOO NUMEROUS TO COUNT  <3 WBC/hpf   RBC / HPF 3-6  <3 RBC/hpf   Bacteria, UA FEW (*) RARE     Imaging Review Dg Chest 2 View  03/02/2014   CLINICAL DATA:  Three-month history of weakness and dizziness now with recent onset of shingles and intermittent fever and chills. ; dyspnea on exertion. ; history of diabetes and hypertension, coronary artery disease and peripheral vascular disease; history of CABG in 2005 ; initial visit  EXAM: CHEST  2 VIEW  COMPARISON:  PA and lateral chest x-ray of Oct 02, 2009  FINDINGS:  The lungs are adequately inflated. There is no focal infiltrate. The cardiopericardial silhouette is enlarged. The interstitial markings are mildly increased especially over the lower lung zones. There is no pleural effusion or pneumothorax. There are post CABG changes. There is degenerative disc change at multiple thoracic levels.  IMPRESSION: There is low-grade pulmonary interstitial edema which may be of cardiac or noncardiac cause. There is no evidence of pneumonia.   Electronically Signed   By: David  Martinique   On: 03/02/2014 14:33   Ct Head Wo Contrast  03/02/2014   CLINICAL DATA:  Recent onset of shingles ; several month history of weakness intermittent gait disturbance  EXAM: CT HEAD WITHOUT CONTRAST  TECHNIQUE: Contiguous axial images were obtained from the base of the skull through the vertex without intravenous contrast.  COMPARISON:  None.  FINDINGS: The ventricles are normal in size and position. There is hypodensity in the anterior aspect of the left corona radiata consistent with previous lacunar infarction. A similar finding is noted peripherally in the anterior aspect of the left basal ganglia. There is no acute intracranial hemorrhage nor evidence of acute ischemic change. There is subtle decreased density in the deep white matter of both cerebral hemispheres consistent with chronic small vessel ischemia. The cerebellum and brainstem are normal.  The observed paranasal sinuses and mastoid air cells are clear. There is no acute skull fracture or lytic or blastic calvarial lesion.  IMPRESSION: 1. There is no acute intracranial hemorrhage nor evidence of acute ischemic change. 2. 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 3. There is evidence of chronic small vessel ischemic change.   Electronically Signed   By: David  Martinique   On: 03/02/2014 14:37   Mr Brain Wo Contrast  03/02/2014   CLINICAL DATA:  Dizziness. Difficulty  walking. Symptoms of 3 months duration.  EXAM: MRI HEAD WITHOUT CONTRAST  TECHNIQUE: Multiplanar, multiecho pulse sequences of the brain and surrounding structures were obtained without intravenous contrast.  COMPARISON:  Head CT 03/02/2014  FINDINGS: Diffusion imaging does not show any acute or subacute infarction. The brainstem and cerebellum are unremarkable. The cerebral hemispheres show chronic small-vessel disease of the deep and subcortical white matter. There is a small right parietal cortical and subcortical infarction and a small right posterior frontal cortical and subcortical infarction. No evidence of mass  lesion, hemorrhage, hydrocephalus or extra-axial collection. No pituitary mass. There is chronic inflammatory change of the sphenoid sinus. No skull or skullbase lesion.  IMPRESSION: No acute or reversible finding.  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  Chronic and acute inflammatory changes of the sphenoid sinus.   Electronically Signed   By: Nelson Chimes M.D.   On: 03/02/2014 18:52     EKG Interpretation   Date/Time:  Wednesday March 02 2014 13:49:00 EDT Ventricular Rate:  62 PR Interval:  182 QRS Duration: 152 QT Interval:  468 QTC Calculation: 475 R Axis:   79 Text Interpretation:  Normal sinus rhythm Right bundle branch block  Abnormal ECG No significant change since last tracing Confirmed by  Zuley Lutter  MD, Merlyn Bollen [phone]) on 03/02/2014 2:18:56 PM      CRITICAL CARE Performed by: Fredia Sorrow Total critical care time: 30 Critical care time was exclusive of separately billable procedures and treating other patients. Critical care was necessary to treat or prevent imminent or life-threatening deterioration. Critical care was time spent personally by me on the following activities: development of treatment plan with patient and/or surrogate as well as nursing, discussions  with consultants, evaluation of patient's response to treatment, examination of patient, obtaining history from patient or surrogate, ordering and performing treatments and interventions, ordering and review of laboratory studies, ordering and review of radiographic studies, pulse oximetry and re-evaluation of patient's condition.     MDM   Final diagnoses:  Essential hypertension  SVT (supraventricular tachycardia)  UTI (lower urinary tract infection)  Other specified fever   The patient presented with some confusion not feeling well. Workup revealed a significant urinary tract infection. Patient started on Rocephin for that urine culture sent. Patient also with generalized weakness for about 3 weeks family members stated that for 3 weeks she has had some confusion. Patient denies a headache. Patient also stated that her gait was abnormal. Hence she had head CT and MRI of brain without evidence of any acute findings or evidence of old infarcts.  When patient came back from MRI her fever jumped up to 102.7 patient had chills heart rate went up to the 150s consistent with a sinus tach. Patient's baseline EKG has a right bundle branch block. Patient converted back to sinus rhythm sinus tachycardia spontaneously a couple times. Almost had adenosine just a block to see what was going on however that was never given. Review of patient's meds to that she has been off of her meds for several days because of not feeling well this includes a beta blocker and she is on benzodiazepines. Patient given some Ativan which improved her patient will be given some Lopressor to help with the low blood pressure and help control the heart rate. Currently the heart rate is back to a heart of sinus rhythm of the heart rate of 81. Patient's mental status is unchanged she is alert following commands. Patient will require admission.  As stated the patient originally get Rocephin for the UTI. Following the spike in  temperature blood cultures were done and patient's antibiotics were broadened out perhaps for early sepsis picture patient stood treated with Zosyn and vancomycin. Patient will require step down admission.     I personally performed the services described in this documentation, which was scribed in my presence. The recorded information has been reviewed and is accurate.       Fredia Sorrow, MD 03/02/14 1958  Addendum:  Patient's blood pressure is now down to systolic of 462 have requested to hold on the Lopressor. Heart rate is still sinus rhythm. Admitting hospitalist agrees with that plan.     Fredia Sorrow, MD 03/02/14 2019

## 2014-03-02 NOTE — ED Notes (Signed)
Md into room to talk too pt re- test results and decision for MRI

## 2014-03-02 NOTE — ED Notes (Addendum)
Remaining fluid  in bag of ns running in as bolus per MD instructins

## 2014-03-02 NOTE — ED Notes (Signed)
MD in room . Instructed to adminster Ativan -

## 2014-03-02 NOTE — Progress Notes (Signed)
ANTIBIOTIC CONSULT NOTE-Preliminary  Pharmacy Consult for Vancomycin and Zosyn Indication: sepsis  Allergies  Allergen Reactions  . Macrobid [Nitrofurantoin] Nausea And Vomiting  . Oxycodone    Patient Measurements:    Vital Signs: Temp: 102.7 F (39.3 C) (10/07 1900) Temp Source: Oral (10/07 1519) BP: 189/54 mmHg (10/07 1946) Pulse Rate: 81 (10/07 1946)  Labs:  Recent Labs  03/02/14 1220  WBC 8.8  HGB 11.6*  PLT 204  CREATININE 1.52*   The CrCl is unknown because both a height and weight (above a minimum accepted value) are required for this calculation.  No results found for this basename: VANCOTROUGH, Corlis Leak, VANCORANDOM, [place], GENTPEAK, GENTRANDOM, TOBRATROUGH, TOBRAPEAK, TOBRARND, AMIKACINPEAK, AMIKACINTROU, AMIKACIN,  in the last 72 hours   Microbiology: Recent Results (from the past 720 hour(s))  CULTURE, BLOOD (ROUTINE X 2)     Status: None   Collection Time    03/02/14  7:30 PM      Result Value Ref Range Status   Specimen Description BLOOD RIGHT ARM   Final   Special Requests BOTTLES DRAWN AEROBIC ONLY 5CC   Final   Culture PENDING   Incomplete   Report Status PENDING   Incomplete  CULTURE, BLOOD (ROUTINE X 2)     Status: None   Collection Time    03/02/14  7:33 PM      Result Value Ref Range Status   Specimen Description BLOOD LEFT HAND   Final   Special Requests BOTTLES DRAWN AEROBIC ONLY 5CC   Final   Culture PENDING   Incomplete   Report Status PENDING   Incomplete   Medical History: Past Medical History  Diagnosis Date  . Diabetes mellitus   . Hypertension   . Hyperlipidemia   . Heart murmur   . Peripheral vascular disease   . Arthritis   . Depression with anxiety   . Carotid artery occlusion   . Chronic kidney disease   . Neuropathy   . CAD (coronary artery disease)   . Rheumatic fever   . Sleep apnea   . PAD (peripheral artery disease)   . Adenomatous colon polyp 09/2007   Anti-infectives   Start     Dose/Rate Route  Frequency Ordered Stop   03/02/14 2100  vancomycin (VANCOCIN) 2,000 mg in sodium chloride 0.9 % 500 mL IVPB     2,000 mg 250 mL/hr over 120 Minutes Intravenous  Once 03/02/14 2019     03/02/14 2000  piperacillin-tazobactam (ZOSYN) IVPB 3.375 g     3.375 g 100 mL/hr over 30 Minutes Intravenous  Once 03/02/14 1953     03/02/14 2000  vancomycin (VANCOCIN) IVPB 1000 mg/200 mL premix  Status:  Discontinued     1,000 mg 200 mL/hr over 60 Minutes Intravenous  Once 03/02/14 1953 03/02/14 2018   03/02/14 1700  cefTRIAXone (ROCEPHIN) 1 g in dextrose 5 % 50 mL IVPB     1 g 100 mL/hr over 30 Minutes Intravenous  Once 03/02/14 1655 03/02/14 1804      Assessment: 68yo female with elevated SCr.  Normalized clcr ~30ml/min.  Asked to initiate Vancomycin and Zosyn.  Goal of Therapy:  Vancomycin trough level 15-20 mcg/ml  Plan:  Preliminary review of pertinent patient information completed.  Protocol will be initiated with one-time dose(s) of Vancomycin 2000mg  and Zosyn 3.375gm.  Forestine Na clinical pharmacist will complete review during morning rounds to assess patient and finalize treatment regimen.  Hart Robinsons A, RPH 03/02/2014,8:26 PM

## 2014-03-02 NOTE — ED Notes (Signed)
Hr noted to have decreased to 90's after pt moved up in bed -

## 2014-03-02 NOTE — ED Notes (Signed)
Pt returned from MRI-  When vital signs obtained pt noted to be tachy 150's . o2 sat in 80's and temp 102.7 - MD informed - into room to see pt.

## 2014-03-02 NOTE — ED Notes (Signed)
C;o dizziness for several months along with shaking as well. Pt and family also reports pt having hallucinations.

## 2014-03-02 NOTE — H&P (Signed)
[facility] History and Physical  [name]:[id number] DOB: [birth year] DOA: 03/02/2014   PCP: Antionette Fairy, PA-C  Specialists: Patient is seen by a nephrologist, Dr. Lowanda Foster. She is also followed by vascular surgery in [facility] for history of right carotid endarterectomy  Chief Complaint: Confusion  HPI: Briana [name] is a 68 y.o. female with a past medical history of hypertension, diabetes mellitus, type II, carotid artery disease, anxiety, and depression, previous history of stroke, who was diagnosed with herpes zoster in the right chest area, about 2 weeks ago. She was treated with Valtrex for the same. She presented to the emergency department with complaints of dizziness for the last several months along with shaking. According to the family patient has been having hallucinations as well. She is a very poor historian and not much information was available from her. She apparently has history of chronic leg pain, which is thought to be secondary to neuropathy. She's had fever at home, but she's unable to confirm it. She's had some bladder problems according to the patient, but again unable to specify. She has been very depressed lately because her granddaughter died of overdose in Apr 17, 2013. So history is very limited in this case. In the emergency department she was spiked a fever of 102. She went into SVT, which converted spontaneously to sinus rhythm.  Home Medications: Prior to Admission medications   Medication Sig Start Date End Date Taking? Authorizing Provider  ALPRAZolam Duanne Moron) 0.5 MG tablet Take 0.5 mg by mouth 3 (three) times daily as needed. For anxiety    Historical Provider, MD  aspirin 325 MG tablet Take 325 mg by mouth daily.    Historical Provider, MD  atenolol (TENORMIN) 25 MG tablet Take 25 mg by mouth daily.    Historical Provider, MD  atorvastatin (LIPITOR) 80 MG tablet  01/10/14   Historical Provider, MD  cetirizine (ZYRTEC) 10 MG  tablet Take 10 mg by mouth daily.     Historical Provider, MD  cyclobenzaprine (FLEXERIL) 10 MG tablet  02/04/14   Historical Provider, MD  diazepam (VALIUM) 5 MG tablet Take 5 mg by mouth at bedtime.    Historical Provider, MD  fluticasone (FLONASE) 50 MCG/ACT nasal spray Place 2 sprays into the nose daily.    Historical Provider, MD  fosinopril (MONOPRIL) 40 MG tablet Take 20 mg by mouth 2 (two) times daily.     Historical Provider, MD  furosemide (LASIX) 40 MG tablet Take 20 mg by mouth 2 (two) times daily.     Historical Provider, MD  gabapentin (NEURONTIN) 300 MG capsule  02/23/14   Historical Provider, MD  HUMULIN 70/30 (70-30) 100 UNIT/ML injection  01/19/14   Historical Provider, MD  hydrALAZINE (APRESOLINE) 25 MG tablet Take 25 mg by mouth 3 (three) times daily.    Historical Provider, MD  HYDROcodone-acetaminophen (LORTAB) 7.5-500 MG per tablet Take 1 tablet by mouth every 6 (six) hours as needed. For pain    Historical Provider, MD  HYDROcodone-acetaminophen (NORCO/VICODIN) 5-325 MG per tablet Take 1-2 tablets by mouth every 6 (six) hours as needed. 02/14/14   [name], NP  losartan (COZAAR) 100 MG tablet  02/23/14   Historical Provider, MD  NOVOLIN N 100 UNIT/ML injection Inject 60 Units as directed daily as needed. Twice daily as directed 11/02/11   Historical Provider, MD  potassium chloride SA (K-DUR,KLOR-CON) 20 MEQ tablet Take 20 mEq by mouth 2 (two) times daily.    Historical Provider, MD  pravastatin (  PRAVACHOL) 40 MG tablet Take 40 mg by mouth daily. 11/07/11   Historical Provider, MD  sertraline (ZOLOFT) 100 MG tablet  02/23/14   Historical Provider, MD  sitaGLIPtin (JANUVIA) 100 MG tablet Take 1 tablet (100 mg total) by mouth 2 (two) times daily. 02/18/12   Danie Binder, MD  sodium bicarbonate 650 MG tablet Take 650 mg by mouth 3 (three) times daily.    Historical Provider, MD  Vitamin D, Ergocalciferol, (DRISDOL) 50000 UNITS CAPS Take 50,000 Units by mouth every 7 (seven)  days.    Historical Provider, MD    Allergies:  Allergies  Allergen Reactions  . Macrobid [Nitrofurantoin] Nausea And Vomiting  . Oxycodone     Past Medical History: Past Medical History  Diagnosis Date  . Diabetes mellitus   . Hypertension   . Hyperlipidemia   . Heart murmur   . Peripheral vascular disease   . Arthritis   . Depression with anxiety   . Carotid artery occlusion   . Chronic kidney disease   . Neuropathy   . CAD (coronary artery disease)   . Rheumatic fever   . Sleep apnea   . PAD (peripheral artery disease)   . Adenomatous colon polyp 09/2007    Past Surgical History  Procedure Laterality Date  . Coronary artery bypass graft  2005  . Carotid endarterectomy  10/04/2009    Right CEA  . Tubal ligation    . Polypectomy  02/25/2012    Procedure: POLYPECTOMY;  Surgeon: Danie Binder, MD;  Location: [facility];  Service: Endoscopy;  Laterality: N/A;    Social History: She lives with her husband. Denies smoking, alcohol use or illicit drug use. According to her grandson she has been quite independent, but over the past few weeks that she has required a cane and has had poor mobility.  Family History:  Family History  Problem Relation Age of Onset  . Breast cancer Mother   . Heart disease Father   . Diabetes Father   . Heart disease Brother      Review of Systems - unable to obtain due to her confusion  Physical Examination  Filed Vitals:   03/02/14 1900 03/02/14 1908 03/02/14 1946 03/02/14 1948  BP:   189/54   Pulse:  146 81   Temp: 102.7 F (39.3 C)     TempSrc:      Resp:  19 24   SpO2:  92% 91% 91%    BP 189/54  Pulse 81  Temp(Src) 102.7 F (39.3 C) (Oral)  Resp 24  SpO2 91%  General appearance: alert, distracted, no distress and morbidly obese Head: Normocephalic, without obvious abnormality, atraumatic Eyes: conjunctivae/corneas clear. PERRL, EOM's intact.  Throat: lips, mucosa, and tongue normal; teeth and gums normal Neck: no  adenopathy, no carotid bruit, no JVD, supple, symmetrical, trachea midline and thyroid not enlarged, symmetric, no tenderness/mass/nodules Resp: clear to auscultation bilaterally Cardio: regular rate and rhythm, S1, S2 normal, no murmur, click, rub or gallop GI: soft, non-tender; bowel sounds normal; no masses,  no organomegaly Extremities: extremities normal, atraumatic, no cyanosis or edema Pulses: 2+ and symmetric Skin: Dry Herpes zoster lesions noted in the right chest. No evidence for inflammation or infection Neurologic: She is alert. Somewhat tremulous. Oriented to person, place, and time. No facial asymmetry. No pronator drift. Strength was equal, bilateral upper and lower extremities. Gait was not assessed  Laboratory Data: Results for orders placed during the hospital encounter of 03/02/14 (from the past 48  hour(s))  TROPONIN I     Status: None   Collection Time    03/02/14 12:20 PM      Result Value Ref Range   Troponin I <0.30  <0.30 ng/mL   Comment:            Due to the release kinetics of cTnI,     a negative result within the first hours     of the onset of symptoms does not rule out     myocardial infarction with certainty.     If myocardial infarction is still suspected,     repeat the test at appropriate intervals.  COMPREHENSIVE METABOLIC PANEL     Status: Abnormal   Collection Time    03/02/14 12:20 PM      Result Value Ref Range   Sodium 133 (*) 137 - 147 mEq/L   Potassium 4.7  3.7 - 5.3 mEq/L   Chloride 98  96 - 112 mEq/L   CO2 21  19 - 32 mEq/L   Glucose, Bld 299 (*) 70 - 99 mg/dL   BUN 33 (*) 6 - 23 mg/dL   Creatinine, Ser 1.52 (*) 0.50 - 1.10 mg/dL   Calcium 8.3 (*) 8.4 - 10.5 mg/dL   Total Protein 6.9  6.0 - 8.3 g/dL   Albumin 2.7 (*) 3.5 - 5.2 g/dL   AST 14  0 - 37 U/L   ALT 7  0 - 35 U/L   Alkaline Phosphatase 82  39 - 117 U/L   Total Bilirubin 0.5  0.3 - 1.2 mg/dL   GFR calc non Af Amer 34 (*) >90 mL/min   GFR calc Af Amer 39 (*) >90 mL/min    Comment: (NOTE)     The eGFR has been calculated using the CKD EPI equation.     This calculation has not been validated in all clinical situations.     eGFR's persistently <90 mL/min signify possible Chronic Kidney     Disease.   Anion gap 14  5 - 15  LIPASE, BLOOD     Status: Abnormal   Collection Time    03/02/14 12:20 PM      Result Value Ref Range   Lipase 10 (*) 11 - 59 U/L  CBC WITH DIFFERENTIAL     Status: Abnormal   Collection Time    03/02/14 12:20 PM      Result Value Ref Range   WBC 8.8  4.0 - 10.5 K/uL   RBC 4.44  3.87 - 5.11 MIL/uL   Hemoglobin 11.6 (*) 12.0 - 15.0 g/dL   HCT 36.1  36.0 - 46.0 %   MCV 81.3  78.0 - 100.0 fL   MCH 26.1  26.0 - 34.0 pg   MCHC 32.1  30.0 - 36.0 g/dL   RDW 16.3 (*) 11.5 - 15.5 %   Platelets 204  150 - 400 K/uL   Neutrophils Relative % 79 (*) 43 - 77 %   Neutro Abs 7.0  1.7 - 7.7 K/uL   Lymphocytes Relative 11 (*) 12 - 46 %   Lymphs Abs 1.0  0.7 - 4.0 K/uL   Monocytes Relative 9  3 - 12 %   Monocytes Absolute 0.8  0.1 - 1.0 K/uL   Eosinophils Relative 0  0 - 5 %   Eosinophils Absolute 0.0  0.0 - 0.7 K/uL   Basophils Relative 1  0 - 1 %   Basophils Absolute 0.0  0.0 - 0.1 K/uL  URINALYSIS,  ROUTINE W REFLEX MICROSCOPIC     Status: Abnormal   Collection Time    03/02/14  3:08 PM      Result Value Ref Range   Color, Urine YELLOW  YELLOW   APPearance CLEAR  CLEAR   Specific Gravity, Urine 1.020  1.005 - 1.030   pH 6.0  5.0 - 8.0   Glucose, UA 100 (*) NEGATIVE mg/dL   Hgb urine dipstick MODERATE (*) NEGATIVE   Bilirubin Urine NEGATIVE  NEGATIVE   Ketones, ur NEGATIVE  NEGATIVE mg/dL   Protein, ur >300 (*) NEGATIVE mg/dL   Urobilinogen, UA 0.2  0.0 - 1.0 mg/dL   Nitrite NEGATIVE  NEGATIVE   Leukocytes, UA NEGATIVE  NEGATIVE  URINE MICROSCOPIC-ADD ON     Status: Abnormal   Collection Time    03/02/14  3:08 PM      Result Value Ref Range   Squamous Epithelial / LPF FEW (*) RARE   WBC, UA TOO NUMEROUS TO COUNT  <3 WBC/hpf   RBC /  HPF 3-6  <3 RBC/hpf   Bacteria, UA FEW (*) RARE  CULTURE, BLOOD (ROUTINE X 2)     Status: None   Collection Time    03/02/14  7:30 PM      Result Value Ref Range   Specimen Description BLOOD RIGHT ARM     Special Requests BOTTLES DRAWN AEROBIC ONLY 5CC     Culture PENDING     Report Status PENDING    CULTURE, BLOOD (ROUTINE X 2)     Status: None   Collection Time    03/02/14  7:33 PM      Result Value Ref Range   Specimen Description BLOOD LEFT HAND     Special Requests BOTTLES DRAWN AEROBIC ONLY 5CC     Culture PENDING     Report Status PENDING    LACTIC ACID, PLASMA     Status: None   Collection Time    03/02/14  7:33 PM      Result Value Ref Range   Lactic Acid, Venous 1.0  0.5 - 2.2 mmol/L  PRO B NATRIURETIC PEPTIDE     Status: Abnormal   Collection Time    03/02/14  7:33 PM      Result Value Ref Range   Pro B Natriuretic peptide (BNP) 6284.0 (*) 0 - 125 pg/mL    Radiology Reports: Dg Chest 2 View  03/02/2014   CLINICAL DATA:  Three-month history of weakness and dizziness now with recent onset of shingles and intermittent fever and chills. ; dyspnea on exertion. ; history of diabetes and hypertension, coronary artery disease and peripheral vascular disease; history of CABG in 2005 ; initial visit  EXAM: CHEST  2 VIEW  COMPARISON:  PA and lateral chest x-ray of Oct 02, 2009  FINDINGS: The lungs are adequately inflated. There is no focal infiltrate. The cardiopericardial silhouette is enlarged. The interstitial markings are mildly increased especially over the lower lung zones. There is no pleural effusion or pneumothorax. There are post CABG changes. There is degenerative disc change at multiple thoracic levels.  IMPRESSION: There is low-grade pulmonary interstitial edema which may be of cardiac or noncardiac cause. There is no evidence of pneumonia.   Electronically Signed   By: David  Martinique   On: 03/02/2014 14:33   Ct Head Wo Contrast  03/02/2014   CLINICAL DATA:  Recent onset  of shingles ; several month history of weakness intermittent gait disturbance  EXAM: CT HEAD WITHOUT  CONTRAST  TECHNIQUE: Contiguous axial images were obtained from the base of the skull through the vertex without intravenous contrast.  COMPARISON:  None.  FINDINGS: The ventricles are normal in size and position. There is hypodensity in the anterior aspect of the left corona radiata consistent with previous lacunar infarction. A similar finding is noted peripherally in the anterior aspect of the left basal ganglia. There is no acute intracranial hemorrhage nor evidence of acute ischemic change. There is subtle decreased density in the deep white matter of both cerebral hemispheres consistent with chronic small vessel ischemia. The cerebellum and brainstem are normal.  The observed paranasal sinuses and mastoid air cells are clear. There is no acute skull fracture or lytic or blastic calvarial lesion.  IMPRESSION: 1. There is no acute intracranial hemorrhage nor evidence of acute ischemic change. 2. 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 3. There is evidence of chronic small vessel ischemic change.   Electronically Signed   By: David  Martinique   On: 03/02/2014 14:37   Mr Brain Wo Contrast  03/02/2014   CLINICAL DATA:  Dizziness. Difficulty walking. Symptoms of 3 months duration.  EXAM: MRI HEAD WITHOUT CONTRAST  TECHNIQUE: Multiplanar, multiecho pulse sequences of the brain and surrounding structures were obtained without intravenous contrast.  COMPARISON:  Head CT 03/02/2014  FINDINGS: Diffusion imaging does not show any acute or subacute infarction. The brainstem and cerebellum are unremarkable. The cerebral hemispheres show chronic small-vessel disease of the deep and subcortical white matter. There is a small right parietal cortical and subcortical infarction and a small right posterior frontal cortical and subcortical infarction. No  evidence of mass lesion, hemorrhage, hydrocephalus or extra-axial collection. No pituitary mass. There is chronic inflammatory change of the sphenoid sinus. No skull or skullbase lesion.  IMPRESSION: No acute or reversible finding.  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  Chronic and acute inflammatory changes of the sphenoid sinus.   Electronically Signed   By: Nelson Chimes M.D.   On: 03/02/2014 18:52    Electrocardiogram: Initial EKG shows sinus rhythm in the 60s. Normal axis. Right bundle branch block was noted. Other, nonspecific changes without any concerning ST segment changes. When she was noted to be tachycardic EKG was repeated and it shows, regular SVT. And then subsequent EKG shows again, sinus rhythm in the 80s.  Problem List  Principal Problem:   UTI (lower urinary tract infection) Active Problems:   Hx of adenomatous colonic polyps   Acute encephalopathy   Dehydration   Generalized weakness   Morbid obesity   Depressed mood   DM type 2 (diabetes mellitus, type 2)   Assessment: This is a 68 year old, Caucasian female with past medical history as stated earlier, who is also morbidly obese, who presents with multiple nonspecific symptoms. Mainly, generalized weakness, dizziness, confusion, hallucinations. She is noted to have a temperature of 102F in the ED. Her UA was abnormal. She also went into SVT a couple of times and spontaneously converted to sinus rhythm. She also mentioned that she hasn't taken any of her medications on a regular basis for the past many weeks. She also appears to be quite depressed.  Plan: #1 fever with abnormal, UA: Etiology is not entirely clear, although she could have a UTI. Blood cultures and urine cultures have been sent. She was initially given ceftriaxone, but has been  switched over to vancomycin and Zosyn due to the high fever, and SVT. Lactic acid  is normal. We will continue Vanc and Zosyn for now. This can be de-escalated based on culture data.  #2 SVT: Possibly due to fever and dehydration. She'll be continued on her beta blocker. Echocardiogram will be ordered. She'll be monitored in the step down unit on telemetry.  #3 acute encephalopathy with hallucinations and depressed mood: No suicidal ideation. She could also have some withdrawal symptoms from not having taken her benzodiazepines in a while. She was on Xanax 3 times a day for anxiety. She also is supposedly on Zoloft, although it's unclear if she is taking it or not. MRI of the brain shows old infarcts, but nothing acute. She, however, was able to answer her and tell me today's date, month, and year. She was able to tell me where she was. She would benefit from psychiatric consultation, which we will order. Continue with the Zoloft. Xanax as needed. Chaplain consult for grief. Next.  #4 dehydration: She has been given IV fluids in the ED. Chest x-ray raised the possibility of mild interstitial edema. However, examination does not suggest the same. We will hold off on further IV fluids for now. Resume her Lasix depending on volume status in the next day or so.   #5 diabetes mellitus, type II: She hasn't taken her insulin in a long time. We'll check HbA1c. Place on sliding scale coverage. Consider low-dose insulin 70/30 depending on her blood sugars.  #6 generalized weakness: No focal neurological deficits were appreciated on examination. MRI does not show any stroke. Will get physical and occupational therapy to evaluate her.  #7 recent herpes zoster involving the right chest: The lesions appeared to have healed. She has completed a course of Valtrex.   DVT Prophylaxis: Heparin Code Status: Full code Family Communication: Discussed with the patient and her grandson  Disposition Plan: Admit to step down   Further management decisions will depend on results of further testing and  patient's response to treatment.   [facility]  [facility] Pager [phone]  If 7PM-7AM, please contact night-coverage [email or web address] Password [facility]  03/02/2014, 8:38 PM

## 2014-03-02 NOTE — ED Notes (Signed)
Hr back up to 160's at this time

## 2014-03-02 NOTE — ED Notes (Signed)
Pt placed on O2 at this time at 2 l via Lead

## 2014-03-03 ENCOUNTER — Inpatient Hospital Stay (HOSPITAL_COMMUNITY): Payer: Medicare Other

## 2014-03-03 DIAGNOSIS — E860 Dehydration: Secondary | ICD-10-CM

## 2014-03-03 DIAGNOSIS — N390 Urinary tract infection, site not specified: Secondary | ICD-10-CM | POA: Diagnosis not present

## 2014-03-03 DIAGNOSIS — I10 Essential (primary) hypertension: Secondary | ICD-10-CM

## 2014-03-03 LAB — HEMOGLOBIN A1C
HEMOGLOBIN A1C: 8.5 % — AB (ref <5.7)
Mean Plasma Glucose: 197 mg/dL — ABNORMAL HIGH (ref <117)

## 2014-03-03 LAB — RPR

## 2014-03-03 LAB — COMPREHENSIVE METABOLIC PANEL
ALBUMIN: 2.4 g/dL — AB (ref 3.5–5.2)
ALT: 7 U/L (ref 0–35)
ANION GAP: 13 (ref 5–15)
AST: 15 U/L (ref 0–37)
Alkaline Phosphatase: 72 U/L (ref 39–117)
BUN: 32 mg/dL — ABNORMAL HIGH (ref 6–23)
CO2: 21 mEq/L (ref 19–32)
CREATININE: 1.55 mg/dL — AB (ref 0.50–1.10)
Calcium: 8.1 mg/dL — ABNORMAL LOW (ref 8.4–10.5)
Chloride: 102 mEq/L (ref 96–112)
GFR calc non Af Amer: 33 mL/min — ABNORMAL LOW (ref >90)
GFR, EST AFRICAN AMERICAN: 38 mL/min — AB (ref >90)
GLUCOSE: 201 mg/dL — AB (ref 70–99)
POTASSIUM: 4.6 meq/L (ref 3.7–5.3)
Sodium: 136 mEq/L — ABNORMAL LOW (ref 137–147)
TOTAL PROTEIN: 6.3 g/dL (ref 6.0–8.3)
Total Bilirubin: 0.4 mg/dL (ref 0.3–1.2)

## 2014-03-03 LAB — HIV ANTIBODY (ROUTINE TESTING W REFLEX): HIV: NONREACTIVE

## 2014-03-03 LAB — CBC
HEMATOCRIT: 33.7 % — AB (ref 36.0–46.0)
HEMOGLOBIN: 10.9 g/dL — AB (ref 12.0–15.0)
MCH: 26.5 pg (ref 26.0–34.0)
MCHC: 32.3 g/dL (ref 30.0–36.0)
MCV: 81.8 fL (ref 78.0–100.0)
Platelets: 197 10*3/uL (ref 150–400)
RBC: 4.12 MIL/uL (ref 3.87–5.11)
RDW: 16.5 % — ABNORMAL HIGH (ref 11.5–15.5)
WBC: 8.2 10*3/uL (ref 4.0–10.5)

## 2014-03-03 LAB — TROPONIN I: Troponin I: <0.3 ng/mL (ref <0.30)

## 2014-03-03 LAB — GLUCOSE, CAPILLARY
GLUCOSE-CAPILLARY: 206 mg/dL — AB (ref 70–99)
Glucose-Capillary: 194 mg/dL — ABNORMAL HIGH (ref 70–99)
Glucose-Capillary: 264 mg/dL — ABNORMAL HIGH (ref 70–99)
Glucose-Capillary: 142 mg/dL — ABNORMAL HIGH (ref 70–99)

## 2014-03-03 LAB — VITAMIN B12: Vitamin B-12: 333 pg/mL (ref 211–911)

## 2014-03-03 LAB — TSH: TSH: 0.874 u[IU]/mL (ref 0.350–4.500)

## 2014-03-03 LAB — MRSA PCR SCREENING: MRSA by PCR: NEGATIVE

## 2014-03-03 NOTE — Progress Notes (Signed)
[facility] PROGRESS NOTE  Briana [name] OXB:[id number] DOB: Aug 15, 1945 DOA: 03/02/2014 PCP: Antionette Fairy, PA-C  Assessment/Plan: 1. Urinary tract infection. Urinalysis indicative possible infection. She is on intravenous antibiotics. Clinically she appears to be improving. Continue to monitor. 2. Fever, likely related to #1. No other clear source of infection. Continue to monitor. 3. Acute encephalopathy. Likely related to #1. Appears to be improving. 4. Dehydration. Patient received IV fluids in the emergency room. Her chest x-ray indicated possible interstitial edema. We'll avoid further IV fluids and encourage by mouth intake. Hold off on Lasix at this point. 5. Recent herpes zoster involving right chest. Lesions have scabbed over. She's completed a course of Valtrex. 6. Generalized weakness. MRI did not show any stroke. Likely related to dehydration/metabolic issues. Appears to be improving. Physical therapy consultation. 7. Brief SVT. Likely related to fever. Appears to have resolved. Echocardiogram has been ordered. She can be monitored on telemetry.  Code Status: full code Family Communication: discussed with husband and son at the bedside Disposition Plan: discharge home when improved, transfer to telemetry   Consultants:    Procedures:    Antibiotics:  Vancomycin 10/7>>  Zosyn 10/7>>  HPI/Subjective: Patient is feeling better this morning. Denies any shortness of breath, cough or other complaints. Family feels that she is less confused today and looks better.  Objective: Filed Vitals:   03/03/14 0900  BP: 96/42  Pulse: 63  Temp:   Resp: 4    Intake/Output Summary (Last 24 hours) at 03/03/14 1218 Last data filed at 03/03/14 0600  Gross per 24 hour  Intake 117.17 ml  Output    300 ml  Net -182.83 ml   Filed Weights   03/02/14 2153  Weight: 111.4 kg (245 lb 9.5 oz)    Exam:   General:  NAD  Cardiovascular: S1, S2 RRR  Respiratory:  crackles at bases  Abdomen: soft, nt, nd, bs+  Musculoskeletal: no edema b/l   Data Reviewed: Basic Metabolic Panel:  Recent Labs Lab 03/02/14 1220 03/03/14 0500  NA 133* 136*  K 4.7 4.6  CL 98 102  CO2 21 21  GLUCOSE 299* 201*  BUN 33* 32*  CREATININE 1.52* 1.55*  CALCIUM 8.3* 8.1*   Liver Function Tests:  Recent Labs Lab 03/02/14 1220 03/03/14 0500  AST 14 15  ALT 7 7  ALKPHOS 82 72  BILITOT 0.5 0.4  PROT 6.9 6.3  ALBUMIN 2.7* 2.4*    Recent Labs Lab 03/02/14 1220  LIPASE 10*   No results found for this basename: AMMONIA,  in the last 168 hours CBC:  Recent Labs Lab 03/02/14 1220 03/03/14 0500  WBC 8.8 8.2  NEUTROABS 7.0  --   HGB 11.6* 10.9*  HCT 36.1 33.7*  MCV 81.3 81.8  PLT 204 197   Cardiac Enzymes:  Recent Labs Lab 03/02/14 1220 03/03/14 0500  TROPONINI <0.30 <0.30   BNP (last 3 results)  Recent Labs  03/02/14 1933  PROBNP 6284.0*   CBG:  Recent Labs Lab 03/02/14 2200 03/03/14 0734 03/03/14 1124  GLUCAP 255* 206* 194*    Recent Results (from the past 240 hour(s))  CULTURE, BLOOD (ROUTINE X 2)     Status: None   Collection Time    03/02/14  7:30 PM      Result Value Ref Range Status   Specimen Description BLOOD RIGHT ARM   Final   Special Requests BOTTLES DRAWN AEROBIC ONLY 5CC   Final   Culture NO GROWTH 1 DAY  Final   Report Status PENDING   Incomplete  CULTURE, BLOOD (ROUTINE X 2)     Status: None   Collection Time    03/02/14  7:33 PM      Result Value Ref Range Status   Specimen Description BLOOD LEFT HAND   Final   Special Requests BOTTLES DRAWN AEROBIC ONLY 5CC   Final   Culture NO GROWTH 1 DAY   Final   Report Status PENDING   Incomplete  MRSA PCR SCREENING     Status: None   Collection Time    03/03/14  5:10 AM      Result Value Ref Range Status   MRSA by PCR NEGATIVE  NEGATIVE Final   Comment:            The GeneXpert MRSA Assay (FDA     approved for NASAL specimens     only), is one component  of a     comprehensive MRSA colonization     surveillance program. It is not     intended to diagnose MRSA     infection nor to guide or     monitor treatment for     MRSA infections.     Studies: Dg Chest 2 View  03/02/2014   CLINICAL DATA:  Three-month history of weakness and dizziness now with recent onset of shingles and intermittent fever and chills. ; dyspnea on exertion. ; history of diabetes and hypertension, coronary artery disease and peripheral vascular disease; history of CABG in 2005 ; initial visit  EXAM: CHEST  2 VIEW  COMPARISON:  PA and lateral chest x-ray of Oct 02, 2009  FINDINGS: The lungs are adequately inflated. There is no focal infiltrate. The cardiopericardial silhouette is enlarged. The interstitial markings are mildly increased especially over the lower lung zones. There is no pleural effusion or pneumothorax. There are post CABG changes. There is degenerative disc change at multiple thoracic levels.  IMPRESSION: There is low-grade pulmonary interstitial edema which may be of cardiac or noncardiac cause. There is no evidence of pneumonia.   Electronically Signed   By: David  Martinique   On: 03/02/2014 14:33   Ct Head Wo Contrast  03/02/2014   CLINICAL DATA:  Recent onset of shingles ; several month history of weakness intermittent gait disturbance  EXAM: CT HEAD WITHOUT CONTRAST  TECHNIQUE: Contiguous axial images were obtained from the base of the skull through the vertex without intravenous contrast.  COMPARISON:  None.  FINDINGS: The ventricles are normal in size and position. There is hypodensity in the anterior aspect of the left corona radiata consistent with previous lacunar infarction. A similar finding is noted peripherally in the anterior aspect of the left basal ganglia. There is no acute intracranial hemorrhage nor evidence of acute ischemic change. There is subtle decreased density in the deep white matter of both cerebral hemispheres consistent with chronic small  vessel ischemia. The cerebellum and brainstem are normal.  The observed paranasal sinuses and mastoid air cells are clear. There is no acute skull fracture or lytic or blastic calvarial lesion.  IMPRESSION: 1. There is no acute intracranial hemorrhage nor evidence of acute ischemic change. 2. 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 3. There is evidence of chronic small vessel ischemic change.   Electronically Signed   By: David  Martinique   On: 03/02/2014 14:37   Mr Brain Wo Contrast  03/02/2014   CLINICAL  DATA:  Dizziness. Difficulty walking. Symptoms of 3 months duration.  EXAM: MRI HEAD WITHOUT CONTRAST  TECHNIQUE: Multiplanar, multiecho pulse sequences of the brain and surrounding structures were obtained without intravenous contrast.  COMPARISON:  Head CT 03/02/2014  FINDINGS: Diffusion imaging does not show any acute or subacute infarction. The brainstem and cerebellum are unremarkable. The cerebral hemispheres show chronic small-vessel disease of the deep and subcortical white matter. There is a small right parietal cortical and subcortical infarction and a small right posterior frontal cortical and subcortical infarction. No evidence of mass lesion, hemorrhage, hydrocephalus or extra-axial collection. No pituitary mass. There is chronic inflammatory change of the sphenoid sinus. No skull or skullbase lesion.  IMPRESSION: No acute or reversible finding.  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  Chronic and acute inflammatory changes of the sphenoid sinus.   Electronically Signed   By: Nelson Chimes M.D.   On: 03/02/2014 18:52    Scheduled Meds: . aspirin  325 mg Oral Daily  . gabapentin  300 mg Oral BID  . heparin  5,000 Units Subcutaneous 3 times per day  . Influenza vac split quadrivalent PF  0.5 mL Intramuscular Tomorrow-1000   . insulin aspart  0-15 Units Subcutaneous TID WC  . insulin aspart  0-5 Units Subcutaneous QHS  . piperacillin-tazobactam (ZOSYN)  IV  3.375 g Intravenous Q8H  . pravastatin  40 mg Oral Daily  . sertraline  100 mg Oral Daily  . sodium chloride  3 mL Intravenous Q12H  . sodium chloride  3 mL Intravenous Q12H  . vancomycin  1,500 mg Intravenous Q24H   Continuous Infusions:   Principal Problem:   UTI (lower urinary tract infection) Active Problems:   Hx of adenomatous colonic polyps   Acute encephalopathy   Dehydration   Generalized weakness   Morbid obesity   Depressed mood   DM type 2 (diabetes mellitus, type 2)    Time spent: 53mins    MEMON,JEHANZEB  [facility] Pager [phone] If 7PM-7AM, please contact night-coverage at [email or web address], password [facility] 03/03/2014, 12:18 PM  LOS: 1 day

## 2014-03-03 NOTE — Progress Notes (Signed)
Patient was sleeping on two attempts. A sister was present who shared about patient's close relationship with her granddaughter who recently died due to a drug overdose. She also shared other family dynamics and her concern for the impact on her sister. Will follow up for support again tomorrow.

## 2014-03-03 NOTE — Evaluation (Signed)
Physical Therapy Evaluation Patient Details Name: Briana [name] MRN: [id number] DOB: Jun 09, 1945 Today's Date: 03/03/2014   History of Present Illness   This is a 68 year old, Caucasian female with past medical history as stated earlier, who is also morbidly obese, who presents with multiple nonspecific symptoms. Mainly, generalized weakness, dizziness, confusion, hallucinations. She is noted to have a temperature of 102F in the ED. Her UA was abnormal. She also went into SVT a couple of times and spontaneously converted to sinus rhythm. She also mentioned that she hasn't taken any of her medications on a regular basis for the past many weeks. She also appears to be quite depressed.  Clinical Impression   Pt is seen for evaluation and found to be deconditioned from baseline.  She lives with her husband and is normally independent with all ADLs.  Today, although she is able to ambulate a functional distance with no assistive device, she tired quickly and she would then have benefitted from having a cane or walker. She was advised to use her cane initially upon going home.  She agrees to have [place] work with her at home.    Follow Up Recommendations Home health PT    Equipment Recommendations  None recommended by PT    Recommendations for Other Services   none    Precautions / Restrictions Precautions Precautions: None Restrictions Weight Bearing Restrictions: No      Mobility  Bed Mobility Overal bed mobility: Modified Independent                Transfers Overall transfer level: Modified independent Equipment used: None                Ambulation/Gait Ambulation/Gait assistance: Supervision Ambulation Distance (Feet): 120 Feet Assistive device: None Gait Pattern/deviations: Decreased stance time - left   Gait velocity interpretation: Below normal speed for age/gender General Gait Details: after walking about 60' gait became mildly antalgic on the left due to  peripheral neuropathy  Stairs            Wheelchair Mobility    Modified Rankin (Stroke Patients Only)       Balance Overall balance assessment: No apparent balance deficits (not formally assessed)                                           Pertinent Vitals/Pain Pain Assessment: No/denies pain    Home Living Family/patient expects to be discharged to:: Private residence Living Arrangements: Spouse/significant other Available Help at Discharge: Family;Available 24 hours/day Type of Home: House Home Access: Stairs to enter Entrance Stairs-Rails: Right Entrance Stairs-Number of Steps: 3 Home Layout: One level Home Equipment: Cane - single point      Prior Function Level of Independence: Independent               Hand Dominance        Extremity/Trunk Assessment               Lower Extremity Assessment: Generalized weakness (pt is deconditioned)         Communication   Communication: No difficulties  Cognition Arousal/Alertness: Awake/alert Behavior During Therapy: WFL for tasks assessed/performed Overall Cognitive Status: Within Functional Limits for tasks assessed                      General Comments      Exercises  Assessment/Plan    PT Assessment All further PT needs can be met in the next venue of care  PT Diagnosis Difficulty walking;Generalized weakness   PT Problem List Decreased strength;Decreased activity tolerance;Decreased mobility;Obesity  PT Treatment Interventions     PT Goals (Current goals can be found in the Care Plan section) Acute Rehab PT Goals PT Goal Formulation: No goals set, d/c therapy    Frequency     Barriers to discharge        Co-evaluation               End of Session Equipment Utilized During Treatment: Gait belt Activity Tolerance: Patient tolerated treatment well Patient left: in chair;with call bell/phone within reach;with family/visitor present Nurse  Communication: Mobility status         Time: 0370-4888 PT Time Calculation (min): 49 min   Charges:   PT Evaluation $Initial PT Evaluation Tier I: 1 Procedure     PT G CodesDemetrios Isaacs L 03/03/2014, 4:36 PM

## 2014-03-03 NOTE — Progress Notes (Signed)
ANTIBIOTIC CONSULT NOTE - INITIAL  Pharmacy Consult for Vancomycin and Zosyn Indication: fever /sepsis / UTI  Allergies  Allergen Reactions  . Macrobid [Nitrofurantoin] Nausea And Vomiting  . Oxycodone    Patient Measurements: Height: 5\' 4"  (162.6 cm) Weight: 245 lb 9.5 oz (111.4 kg) IBW/kg (Calculated) : 54.7  Vital Signs: Temp: 102.9 F (39.4 C) (10/08 0400) Temp Source: Oral (10/08 0400) BP: 96/42 mmHg (10/08 0900) Pulse Rate: 63 (10/08 0900) Intake/Output from previous day: 10/07 0701 - 10/08 0700 In: 117.2 [I.V.:67.2; IV Piggyback:50] Out: 300 [Urine:300] Intake/Output from this shift:    Labs:  Recent Labs  03/02/14 1220 03/03/14 0500  WBC 8.8 8.2  HGB 11.6* 10.9*  PLT 204 197  CREATININE 1.52* 1.55*   Estimated Creatinine Clearance: 41.9 ml/min (by C-G formula based on Cr of 1.55). No results found for this basename: VANCOTROUGH, Corlis Leak, VANCORANDOM, GENTTROUGH, GENTPEAK, GENTRANDOM, TOBRATROUGH, TOBRAPEAK, TOBRARND, AMIKACINPEAK, AMIKACINTROU, AMIKACIN,  in the last 72 hours   Microbiology: Recent Results (from the past 720 hour(s))  CULTURE, BLOOD (ROUTINE X 2)     Status: None   Collection Time    03/02/14  7:30 PM      Result Value Ref Range Status   Specimen Description BLOOD RIGHT ARM   Final   Special Requests BOTTLES DRAWN AEROBIC ONLY 5CC   Final   Culture NO GROWTH 1 DAY   Final   Report Status PENDING   Incomplete  CULTURE, BLOOD (ROUTINE X 2)     Status: None   Collection Time    03/02/14  7:33 PM      Result Value Ref Range Status   Specimen Description BLOOD LEFT HAND   Final   Special Requests BOTTLES DRAWN AEROBIC ONLY 5CC   Final   Culture NO GROWTH 1 DAY   Final   Report Status PENDING   Incomplete  MRSA PCR SCREENING     Status: None   Collection Time    03/03/14  5:10 AM      Result Value Ref Range Status   MRSA by PCR NEGATIVE  NEGATIVE Final   Comment:            The GeneXpert MRSA Assay (FDA     approved for NASAL  specimens     only), is one component of a     comprehensive MRSA colonization     surveillance program. It is not     intended to diagnose MRSA     infection nor to guide or     monitor treatment for     MRSA infections.    Medical History: Past Medical History  Diagnosis Date  . Diabetes mellitus   . Hypertension   . Hyperlipidemia   . Heart murmur   . Peripheral vascular disease   . Arthritis   . Depression with anxiety   . Carotid artery occlusion   . Chronic kidney disease   . Neuropathy   . CAD (coronary artery disease)   . Rheumatic fever   . Sleep apnea   . PAD (peripheral artery disease)   . Adenomatous colon polyp 09/2007   Anti-infectives   Start     Dose/Rate Route Frequency Ordered Stop   03/03/14 2100  vancomycin (VANCOCIN) 1,500 mg in sodium chloride 0.9 % 500 mL IVPB     1,500 mg 250 mL/hr over 120 Minutes Intravenous Every 24 hours 03/02/14 2156     03/03/14 0600  piperacillin-tazobactam (ZOSYN) IVPB 3.375 g  3.375 g 12.5 mL/hr over 240 Minutes Intravenous Every 8 hours 03/02/14 2156     03/02/14 2100  vancomycin (VANCOCIN) 2,000 mg in sodium chloride 0.9 % 500 mL IVPB     2,000 mg 250 mL/hr over 120 Minutes Intravenous  Once 03/02/14 2019 03/02/14 2323   03/02/14 2100  vancomycin (VANCOCIN) 1 GM/200ML IVPB    Comments:  Wall, Yvette   : cabinet override      03/02/14 2100 03/02/14 2117   03/02/14 2000  piperacillin-tazobactam (ZOSYN) IVPB 3.375 g     3.375 g 100 mL/hr over 30 Minutes Intravenous  Once 03/02/14 1953 03/02/14 2102   03/02/14 2000  vancomycin (VANCOCIN) IVPB 1000 mg/200 mL premix  Status:  Discontinued     1,000 mg 200 mL/hr over 60 Minutes Intravenous  Once 03/02/14 1953 03/02/14 2018   03/02/14 1700  cefTRIAXone (ROCEPHIN) 1 g in dextrose 5 % 50 mL IVPB     1 g 100 mL/hr over 30 Minutes Intravenous  Once 03/02/14 1655 03/02/14 1804     Assessment: 68yo female with fever.  Asked to initiate Vancomycin and Zosyn.  Initial doses  were given on admission.  Estimated Creatinine Clearance: 41.9 ml/min (by C-G formula based on Cr of 1.55).  Goal of Therapy:  Vancomycin trough level 15-20 mcg/ml  Plan:  Zosyn 3.375gm IV q8h, each dose over 4 hrs Vancomycin 1500mg  IV q24hrs (renally adjusted) Check Vancomycin trough level at steady state Monitor labs, renal fxn, and cultures  Hart Robinsons A 03/03/2014,11:54 AM

## 2014-03-03 NOTE — Progress Notes (Signed)
Inpatient Diabetes Program Recommendations  AACE/ADA: New Consensus Statement on Inpatient Glycemic Control (2013)  Target Ranges:  Prepandial:   less than 140 mg/dL      Peak postprandial:   less than 180 mg/dL (1-2 hours)      Critically ill patients:  140 - 180 mg/dL   Results for Briana [name], Briana [name] (MRN [id number]) as of 03/03/2014 08:41  Ref. Range 03/02/2014 22:00 03/03/2014 07:34  Glucose-Capillary Latest Range: 70-99 mg/dL 255 (H) 206 (H)   Diabetes history: DM2 Outpatient Diabetes medications: Januvia 100 mg BID, 70/30 50 units QAM, 70/30 45 units QPM (noted in H&P that pt has not been taking insulin recently) Current orders for Inpatient glycemic control: Novolog 0-15 units AC, Novolog 0-5 units HS  Inpatient Diabetes Program Recommendations Insulin - Basal: Please consider ordering basal insulin. If patient is eating please order 70/30 7 units BID. If patient is not eating or has a poor appetitie, please order Levemir 10 units daily. HgbA1C: A1C has been ordered and is pending.  Thanks, Barnie Alderman, RN, MSN, CCRN Diabetes Coordinator Inpatient Diabetes Program [phone] (Team Pager) [phone] ([facility] office) [phone] [facility] office)

## 2014-03-03 NOTE — Progress Notes (Signed)
MD wanted to hold off on telepsych consult at this time.  Will continue to monitor. Schonewitz, Eulis Canner 03/03/2014

## 2014-03-04 DIAGNOSIS — I359 Nonrheumatic aortic valve disorder, unspecified: Secondary | ICD-10-CM

## 2014-03-04 DIAGNOSIS — N179 Acute kidney failure, unspecified: Secondary | ICD-10-CM

## 2014-03-04 DIAGNOSIS — R531 Weakness: Secondary | ICD-10-CM

## 2014-03-04 DIAGNOSIS — N183 Chronic kidney disease, stage 3 unspecified: Secondary | ICD-10-CM | POA: Diagnosis present

## 2014-03-04 LAB — CBC
HCT: 33.4 % — ABNORMAL LOW (ref 36.0–46.0)
Hemoglobin: 10.5 g/dL — ABNORMAL LOW (ref 12.0–15.0)
MCH: 25.7 pg — ABNORMAL LOW (ref 26.0–34.0)
MCHC: 31.4 g/dL (ref 30.0–36.0)
MCV: 81.9 fL (ref 78.0–100.0)
Platelets: 188 10*3/uL (ref 150–400)
RBC: 4.08 MIL/uL (ref 3.87–5.11)
RDW: 16.6 % — AB (ref 11.5–15.5)
WBC: 6.9 10*3/uL (ref 4.0–10.5)

## 2014-03-04 LAB — GLUCOSE, CAPILLARY
GLUCOSE-CAPILLARY: 233 mg/dL — AB (ref 70–99)
GLUCOSE-CAPILLARY: 203 mg/dL — AB (ref 70–99)
Glucose-Capillary: 144 mg/dL — ABNORMAL HIGH (ref 70–99)
Glucose-Capillary: 172 mg/dL — ABNORMAL HIGH (ref 70–99)

## 2014-03-04 LAB — BASIC METABOLIC PANEL
ANION GAP: 14 (ref 5–15)
BUN: 46 mg/dL — ABNORMAL HIGH (ref 6–23)
CALCIUM: 7.8 mg/dL — AB (ref 8.4–10.5)
CO2: 21 mEq/L (ref 19–32)
CREATININE: 2.33 mg/dL — AB (ref 0.50–1.10)
Chloride: 101 mEq/L (ref 96–112)
GFR calc Af Amer: 23 mL/min — ABNORMAL LOW (ref >90)
GFR calc non Af Amer: 20 mL/min — ABNORMAL LOW (ref >90)
Glucose, Bld: 131 mg/dL — ABNORMAL HIGH (ref 70–99)
Potassium: 4.3 mEq/L (ref 3.7–5.3)
Sodium: 136 mEq/L — ABNORMAL LOW (ref 137–147)

## 2014-03-04 LAB — URINE CULTURE

## 2014-03-04 LAB — FOLATE RBC: RBC FOLATE: 974 ng/mL — AB (ref >280)

## 2014-03-04 MED ORDER — SODIUM CHLORIDE 0.9 % IV SOLN
INTRAVENOUS | Status: DC
  Administered 2014-03-04: 22:00:00 via INTRAVENOUS

## 2014-03-04 NOTE — Care Management Note (Signed)
    Page 1 of 1   03/04/2014     3:09:34 PM CARE MANAGEMENT NOTE 03/04/2014  Patient:  Briana [name], Briana [name]   Account Number:  [id number]  Date Initiated:  03/04/2014  Documentation initiated by:  Theophilus Kinds  Subjective/Objective Assessment:   Pt admitted from home with UTI. Pt lives with her husband and will return home at discharge. Pt requires mod assistance with ADl's. Pt has a cane for prn use.     Action/Plan:   PT recommends [place] at discharge. Will send orders once written. [place] services to start within 48 hours of discharge. PT and pts nurse aware of discharge arrangements.   Anticipated DC Date:  03/06/2014   Anticipated DC Plan:  [place]  CM consult      [facility] Choice  HOME HEALTH   Choice offered to / List presented to:  C-1 Patient        [place] arranged  [place] PT      [place]   Status of service:  Completed, signed off Medicare Important Message given?  YES (If response is "NO", the following Medicare IM given date fields will be blank) Date Medicare IM given:  03/04/2014 Medicare IM given by:  Theophilus Kinds Date Additional Medicare IM given:   Additional Medicare IM given by:    Discharge Disposition:  [place]  Per UR Regulation:    If discussed at Long Length of Stay Meetings, dates discussed:    Comments:  03/04/14 [place], RN BSN CM

## 2014-03-04 NOTE — Progress Notes (Signed)
[facility] PROGRESS NOTE  Briana [name] PRF:[id number] DOB: 1945-06-02 DOA: 03/02/2014 PCP: Antionette Fairy, PA-C  Assessment/Plan: 1. Urinary tract infection. Urinalysis indicative possible infection. She is on intravenous antibiotics. Clinically she appears to be improving. Continue to monitor. 2. Fever, likely related to #1. No other clear source of infection. Continue to monitor. 3. Acute encephalopathy. Likely related to #1. Appears to be improving. 4. Dehydration. Patient received IV fluids in the emergency room. Her chest x-ray indicated possible interstitial edema/pneumonitis. Will give a trial of IV fluids. 5. Recent herpes zoster involving right chest. Lesions have scabbed over. She's completed a course of Valtrex. 6. Generalized weakness. MRI did not show any stroke. Likely related to dehydration/metabolic issues. Appears to be improving. Physical therapy consultation. 7. Brief SVT. Likely related to fever. Appears to have resolved. Echocardiogram has been ordered. She can be monitored on telemetry. 8. Acute renal failure. Possibly related to dehydration and concomitant vancomycin.  Vancomycin discontinued today. Will give IV fluids. Recheck labs in morning. Monitor urine output  Code Status: full code Family Communication: discussed with husband and son at the bedside Disposition Plan: discharge home when improved, transfer to telemetry   Consultants:    Procedures:    Antibiotics:  Vancomycin 10/7>>10/9  Zosyn 10/7>>  HPI/Subjective: Feeling better this morning. No new complaints.  Objective: Filed Vitals:   03/04/14 1640  BP: 116/51  Pulse: 58  Temp: 98.6 F (37 C)  Resp: 18    Intake/Output Summary (Last 24 hours) at 03/04/14 1922 Last data filed at 03/04/14 1409  Gross per 24 hour  Intake    940 ml  Output    700 ml  Net    240 ml   Filed Weights   03/02/14 2153 03/04/14 0500 03/04/14 1640  Weight: 111.4 kg (245 lb 9.5 oz) 112.3 kg  (247 lb 9.2 oz) 112.764 kg (248 lb 9.6 oz)    Exam:   General:  NAD  Cardiovascular: S1, S2 RRR  Respiratory: CTA B  Abdomen: soft, nt, nd, bs+  Musculoskeletal: no edema b/l   Data Reviewed: Basic Metabolic Panel:  Recent Labs Lab 03/02/14 1220 03/03/14 0500 03/04/14 0516  NA 133* 136* 136*  K 4.7 4.6 4.3  CL 98 102 101  CO2 21 21 21   GLUCOSE 299* 201* 131*  BUN 33* 32* 46*  CREATININE 1.52* 1.55* 2.33*  CALCIUM 8.3* 8.1* 7.8*   Liver Function Tests:  Recent Labs Lab 03/02/14 1220 03/03/14 0500  AST 14 15  ALT 7 7  ALKPHOS 82 72  BILITOT 0.5 0.4  PROT 6.9 6.3  ALBUMIN 2.7* 2.4*    Recent Labs Lab 03/02/14 1220  LIPASE 10*   No results found for this basename: AMMONIA,  in the last 168 hours CBC:  Recent Labs Lab 03/02/14 1220 03/03/14 0500 03/04/14 0516  WBC 8.8 8.2 6.9  NEUTROABS 7.0  --   --   HGB 11.6* 10.9* 10.5*  HCT 36.1 33.7* 33.4*  MCV 81.3 81.8 81.9  PLT 204 197 188   Cardiac Enzymes:  Recent Labs Lab 03/02/14 1220 03/03/14 0500  TROPONINI <0.30 <0.30   BNP (last 3 results)  Recent Labs  03/02/14 1933  PROBNP 6284.0*   CBG:  Recent Labs Lab 03/03/14 2149 03/04/14 0723 03/04/14 1143 03/04/14 1605 03/04/14 1708  GLUCAP 142* 144* 172* 233* 203*    Recent Results (from the past 240 hour(s))  URINE CULTURE     Status: None   Collection Time  03/02/14  3:08 PM      Result Value Ref Range Status   Specimen Description URINE, CATHETERIZED   Final   Special Requests NONE   Final   Culture  Setup Time     Final   Value: 03/03/2014 00:36     Performed at [facility] [facility]     Final   Value: 70,000 COLONIES/ML     Performed at [facility]   Culture     Final   Value: ESCHERICHIA COLI     Performed at [facility]   Report Status 03/04/2014 FINAL   Final   Organism ID, Bacteria ESCHERICHIA COLI   Final  CULTURE, BLOOD (ROUTINE X 2)     Status: None   Collection Time     03/02/14  7:30 PM      Result Value Ref Range Status   Specimen Description BLOOD RIGHT HAND DRAWN BY RN   Final   Special Requests BOTTLES DRAWN AEROBIC ONLY 5CC   Final   Culture NO GROWTH 2 DAYS   Final   Report Status PENDING   Incomplete  CULTURE, BLOOD (ROUTINE X 2)     Status: None   Collection Time    03/02/14  7:33 PM      Result Value Ref Range Status   Specimen Description BLOOD LEFT HAND   Final   Special Requests BOTTLES DRAWN AEROBIC ONLY 5CC   Final   Culture NO GROWTH 2 DAYS   Final   Report Status PENDING   Incomplete  MRSA PCR SCREENING     Status: None   Collection Time    03/03/14  5:10 AM      Result Value Ref Range Status   MRSA by PCR NEGATIVE  NEGATIVE Final   Comment:            The GeneXpert MRSA Assay (FDA     approved for NASAL specimens     only), is one component of a     comprehensive MRSA colonization     surveillance program. It is not     intended to diagnose MRSA     infection nor to guide or     monitor treatment for     MRSA infections.     Studies: Dg Chest Port 1 View  03/03/2014   CLINICAL DATA:  Hypoxia  EXAM: PORTABLE CHEST - 1 VIEW  COMPARISON:  03/02/2014  FINDINGS: Cardiomegaly again noted. Persistent mild interstitial prominence bilaterally. Mild edema or pneumonitis cannot be excluded. No segmental infiltrates.  IMPRESSION: Persistent mild interstitial prominence bilaterally. Mild edema or pneumonitis cannot be excluded. No segmental infiltrate.   Electronically Signed   By: Lahoma Crocker M.D.   On: 03/03/2014 13:51    Scheduled Meds: . aspirin  325 mg Oral Daily  . gabapentin  300 mg Oral BID  . heparin  5,000 Units Subcutaneous 3 times per day  . insulin aspart  0-15 Units Subcutaneous TID WC  . insulin aspart  0-5 Units Subcutaneous QHS  . piperacillin-tazobactam (ZOSYN)  IV  3.375 g Intravenous Q8H  . pravastatin  40 mg Oral Daily  . sertraline  100 mg Oral Daily  . sodium chloride  3 mL Intravenous Q12H  . sodium chloride   3 mL Intravenous Q12H   Continuous Infusions:   Principal Problem:   UTI (lower urinary tract infection) Active Problems:   Hx of adenomatous colonic polyps   Acute encephalopathy  Dehydration   Generalized weakness   Morbid obesity   Depressed mood   DM type 2 (diabetes mellitus, type 2)   CKD (chronic kidney disease) stage 3, GFR 30-59 ml/min   Acute renal failure    Time spent: 28mins    MEMON,JEHANZEB  [facility] Pager [phone] If 7PM-7AM, please contact night-coverage at [email or web address], password [facility] 03/04/2014, 7:22 PM  LOS: 2 days

## 2014-03-04 NOTE — Progress Notes (Signed)
  Echocardiogram 2D Echocardiogram has been performed.  Darlina Sicilian M 03/04/2014, 9:23 AM

## 2014-03-04 NOTE — Evaluation (Signed)
Occupational Therapy Evaluation Patient Details Name: Briana [name] MRN: [id number] DOB: [birth year] Today's Date: 03/04/2014    History of Present Illness This is a 68 year old, Caucasian female with past medical history as stated earlier, who is also morbidly obese, who presents with multiple nonspecific symptoms. Mainly, generalized weakness, dizziness, confusion, hallucinations. She is noted to have a temperature of 102F in the ED. Her UA was abnormal. She also went into SVT a couple of times and spontaneously converted to sinus rhythm. She also mentioned that she hasn't taken any of her medications on a regular basis for the past many weeks. She also appears to be quite depressed.   Clinical Impression   Pt is presenting to acute OT with above situation.  She is independent in all ADLs at baseline and is presenting at baseline during OT assessment today.  She has WFL strength and ROM in BUE.  Pt expresses no concerns about ADL or IADL functioning upon d/c home.  Pt will benefit from HHPT evaluation for deconditioning, as recommended by acute PT evaluation.  No further acute OT needs at this time. Pt is discharged from [facility] OT services.    Follow Up Recommendations  No OT follow up    Equipment Recommendations  None recommended by OT    Recommendations for Other Services       Precautions / Restrictions Precautions Precautions: None Restrictions Weight Bearing Restrictions: No      Mobility Bed Mobility Overal bed mobility: Modified Independent                Transfers Overall transfer level: Modified independent Equipment used: None                  Balance Overall balance assessment: No apparent balance deficits (not formally assessed)                                          ADL Overall ADL's : At baseline;Modified independent                                             Vision                      Perception     Praxis      Pertinent Vitals/Pain Pain Assessment: No/denies pain     Hand Dominance Right   Extremity/Trunk Assessment Upper Extremity Assessment Upper Extremity Assessment: Overall WFL for tasks assessed   Lower Extremity Assessment Lower Extremity Assessment: Defer to PT evaluation       Communication Communication Communication: No difficulties   Cognition Arousal/Alertness: Awake/alert Behavior During Therapy: WFL for tasks assessed/performed Overall Cognitive Status: Within Functional Limits for tasks assessed                     General Comments       Exercises       Shoulder Instructions      Home Living Family/patient expects to be discharged to:: Private residence Living Arrangements: Spouse/significant other Available Help at Discharge: Family;Available 24 hours/day Type of Home: House Home Access: Stairs to enter [employer] of Steps: 3 Entrance Stairs-Rails: Left (going up) Home Layout: One level     Bathroom Shower/Tub: Walk-in shower  Bathroom Toilet: Handicapped height     Home Equipment: Bedside commode;Shower seat;Cane - quad;Cane - single point;Grab bars - tub/shower;Hand held shower head          Prior Functioning/Environment Level of Independence: Independent             OT Diagnosis: Generalized weakness   OT Problem List:     OT Treatment/Interventions:      OT Goals(Current goals can be found in the care plan section) Acute Rehab OT Goals Patient Stated Goal: No OT goals needed OT Goal Formulation: With patient  OT Frequency:     Barriers to D/C:            Co-evaluation              End of Session Equipment Utilized During Treatment: Gait belt  Activity Tolerance: Patient tolerated treatment well Patient left: in bed;with call bell/phone within reach   Time: 0950-1020 OT Time Calculation (min): 30 min Charges:  OT General Charges $OT Visit: 1 Procedure OT  Evaluation $Initial OT Evaluation Tier I: 1 Procedure G-Codes:    Briana Graff [employer], MS, OTR/L [place] [phone] 03/04/2014, 10:23 AM

## 2014-03-04 NOTE — Progress Notes (Signed)
UR chart review completed.  

## 2014-03-04 NOTE — Clinical Documentation Improvement (Signed)
Please clarify stage of CKD. Thank you. . Document the stage of CKD --Chronic kidney disease, stage 1- GFR > OR = 90 --Chronic kidney disease, stage 2 (mild) - GFR 60-89 --Chronic kidney disease, stage 3 (moderate) - GFR 30-59 --Chronic kidney disease, stage 4 (severe) - GFR 15-29 --Chronic kidney disease, stage 5- GFR < 15 --End-stage renal disease (ESRD)  . Document any underlying cause of CKD such as Diabetes or Hypertension . Chronic renal failure without a documented stage will be assigned to Chronic kidney disease, unspecified . Document any associated diagnoses/conditions . Document underlying condition(s) contributing/causing acute renal failure if known or suspected  Please clarify Acuity if appropriate. Thank you . Document if acute kidney injury (AKI) is due to traumatic injury or if due to a non-traumatic event . Document if acute renal failure is due to: --Acute tubular necrosis (ATN) --Other (specify) . Be specific with documentation --Acute renal insufficiency and acute kidney disease are not reported as acute renal failure . Document any associated diagnoses/conditions  Supporting Information: History of CKD, DM II, Hypertension & Vascular disease Admitted with UTI, weakness, confusion, and dehydration  Labs: BUN/CR/GFR 10/7 = 33/1.52/34 10/8 = 32/1.55/33 10/946/2.33/20  Treatment Monitoring BMP IV fluids saline locked I&O qshift  Thank You,  Leane Loring T. Pricilla Handler, MSN, MBA/MHA Clinical Documentation Specialist [email or web address] Office # [phone]

## 2014-03-05 LAB — BASIC METABOLIC PANEL
ANION GAP: 13 (ref 5–15)
BUN: 45 mg/dL — AB (ref 6–23)
CALCIUM: 8.4 mg/dL (ref 8.4–10.5)
CO2: 21 mEq/L (ref 19–32)
CREATININE: 2.01 mg/dL — AB (ref 0.50–1.10)
Chloride: 105 mEq/L (ref 96–112)
GFR calc Af Amer: 28 mL/min — ABNORMAL LOW (ref >90)
GFR calc non Af Amer: 24 mL/min — ABNORMAL LOW (ref >90)
Glucose, Bld: 177 mg/dL — ABNORMAL HIGH (ref 70–99)
Potassium: 4.8 mEq/L (ref 3.7–5.3)
Sodium: 139 mEq/L (ref 137–147)

## 2014-03-05 LAB — CBC
HEMATOCRIT: 34.8 % — AB (ref 36.0–46.0)
Hemoglobin: 11 g/dL — ABNORMAL LOW (ref 12.0–15.0)
MCH: 26.2 pg (ref 26.0–34.0)
MCHC: 31.6 g/dL (ref 30.0–36.0)
MCV: 82.9 fL (ref 78.0–100.0)
Platelets: 215 10*3/uL (ref 150–400)
RBC: 4.2 MIL/uL (ref 3.87–5.11)
RDW: 16.4 % — AB (ref 11.5–15.5)
WBC: 7 10*3/uL (ref 4.0–10.5)

## 2014-03-05 LAB — GLUCOSE, CAPILLARY
GLUCOSE-CAPILLARY: 222 mg/dL — AB (ref 70–99)
GLUCOSE-CAPILLARY: 201 mg/dL — AB (ref 70–99)
Glucose-Capillary: 163 mg/dL — ABNORMAL HIGH (ref 70–99)
Glucose-Capillary: 206 mg/dL — ABNORMAL HIGH (ref 70–99)

## 2014-03-05 MED ORDER — FUROSEMIDE 40 MG PO TABS: 40.0000 mg | ORAL_TABLET | Freq: Every day | ORAL | Status: DC

## 2014-03-05 MED ORDER — DEXTROSE 5 % IV SOLN
1.0000 g | INTRAVENOUS | Status: DC
  Filled 2014-03-05 (×2): qty 10

## 2014-03-05 MED ORDER — HALOPERIDOL LACTATE 5 MG/ML IJ SOLN
5.0000 mg | Freq: Four times a day (QID) | INTRAMUSCULAR | Status: DC | PRN
  Filled 2014-03-05: qty 1

## 2014-03-05 MED ORDER — LORAZEPAM 2 MG/ML IJ SOLN: 2.0000 mg | INTRAMUSCULAR | Status: DC | PRN

## 2014-03-05 MED ORDER — DIAZEPAM 5 MG PO TABS
5.0000 mg | ORAL_TABLET | Freq: Three times a day (TID) | ORAL | Status: DC
Start: 2014-03-05 — End: 2014-03-05
  Administered 2014-03-05: 5 mg via ORAL
  Filled 2014-03-05: qty 1

## 2014-03-05 NOTE — Progress Notes (Signed)
Pt refused 1700 SSI. CBG=206.  Dr. Roderic Palau paged and made aware. New orders received will continue to monitor.

## 2014-03-05 NOTE — Progress Notes (Signed)
Pt complains that she has had multiple episodes of diarrhea through the night.  Night shift RN did not report this to oncoming RN.  Also, there is not documentation of this under doc flowsheets.  Pt requesting Immodium. No order for this.  Dr. Roderic Palau made aware. States will see patient during rounds. Will continue to monitor.

## 2014-03-05 NOTE — Discharge Summary (Signed)
Physician Discharge Summary  NICKI FURLAN BOF:[id number] DOB: [birth year] DOA: 03/02/2014  PCP: Antionette Fairy, PA-C  Admit date: 03/02/2014 Discharge date: 03/05/2014  Time spent: 35 minutes  PATIENT WAS SIGNED Briana [name] The patient's medications could not be reconciled and followup appointments could not be made.  Discharge Diagnoses:  Principal Problem:   UTI (lower urinary tract infection) Active Problems:   Hx of adenomatous colonic polyps   Acute encephalopathy   Dehydration   Generalized weakness   Morbid obesity   Depressed mood   DM type 2 (diabetes mellitus, type 2)   CKD (chronic kidney disease) stage 3, GFR 30-59 ml/min   Acute renal failure  paranoia    Filed Weights   03/02/14 2153 03/04/14 0500 03/04/14 1640  Weight: 111.4 kg (245 lb 9.5 oz) 112.3 kg (247 lb 9.2 oz) 112.764 kg (248 lb 9.6 oz)    History of present illness:  Briana [name] is a 68 y.o. female with a past medical history of hypertension, diabetes mellitus, type II, carotid artery disease, anxiety, and depression, previous history of stroke, who was diagnosed with herpes zoster in the right chest area, about 2 weeks ago. She was treated with Valtrex for the same. She presented to the emergency department with complaints of dizziness for the last several months along with shaking. According to the family patient has been having hallucinations as well. She is a very poor historian and not much information was available from her. She apparently has history of chronic leg pain, which is thought to be secondary to neuropathy. She's had fever at home, but she's unable to confirm it. She's had some bladder problems according to the patient, but again unable to specify. She has been very depressed lately because her granddaughter died of overdose in Apr 26, 2013. So history is very limited in this case. In the emergency department she was spiked a fever  of 102. She went into SVT, which converted spontaneously to sinus rhythm.   Hospital Course:  This patient was brought to the hospital with confusion and altered mental status as well as fevers. She was found to have significant urinary tract infection and was started on intravenous antibiotics. The patient clinically began to improve her fevers resolved. She was hydrated with IV fluids. Unfortunately, after initial improvement, patient began becoming increasingly confused again. She became very paranoid and began to have hallucinations. Her husband reported that she does suffer from depression but did not have any other mental health history. She was taking benzodiazepines at home and had not received any in the hospital and therefore there was question of benzodiazepine withdrawal. Psychiatry consultation was requested for further recommendations. Unfortunately patient became very agitated and took out her IV. She was adamant to be discharged home. Her husband was called to assist in her care. I was called by staff that has been was also insistent on taking her home. It was explained to him the patient is still very confused and paranoid and needs further inpatient care. I did not recommend discharge. The patient and her husband were still very adamant about taking the patient home. They were explained the risks of discharge including worsening condition and death. The patient's husband signed [place] paperwork. He was explained that the patient's condition deteriorated that he should call 911 or return to the hospital.  Procedures: Echo:- Moderate LVH with LVEF 77-82%, grade 2 diastolic dysfunction. Moderate left atrial enlargement. Severely  calcified mitral annulus with mild mitral regurgitation. Sclerotic aortic valve with mild aortic regurgitation. Mildly reduced RV contraction. Mild tricuspid regurgitation with evidence of severe pulmonary hypertension, PASP 61 mm  mercury.     The results of significant diagnostics from this hospitalization (including imaging, microbiology, ancillary and laboratory) are listed below for reference.    Significant Diagnostic Studies: Dg Chest 2 View  03/02/2014   CLINICAL DATA:  Three-month history of weakness and dizziness now with recent onset of shingles and intermittent fever and chills. ; dyspnea on exertion. ; history of diabetes and hypertension, coronary artery disease and peripheral vascular disease; history of CABG in 2005 ; initial visit  EXAM: CHEST  2 VIEW  COMPARISON:  PA and lateral chest x-ray of Oct 02, 2009  FINDINGS: The lungs are adequately inflated. There is no focal infiltrate. The cardiopericardial silhouette is enlarged. The interstitial markings are mildly increased especially over the lower lung zones. There is no pleural effusion or pneumothorax. There are post CABG changes. There is degenerative disc change at multiple thoracic levels.  IMPRESSION: There is low-grade pulmonary interstitial edema which may be of cardiac or noncardiac cause. There is no evidence of pneumonia.   Electronically Signed   By: David  Martinique   On: 03/02/2014 14:33   Ct Head Wo Contrast  03/02/2014   CLINICAL DATA:  Recent onset of shingles ; several month history of weakness intermittent gait disturbance  EXAM: CT HEAD WITHOUT CONTRAST  TECHNIQUE: Contiguous axial images were obtained from the base of the skull through the vertex without intravenous contrast.  COMPARISON:  None.  FINDINGS: The ventricles are normal in size and position. There is hypodensity in the anterior aspect of the left corona radiata consistent with previous lacunar infarction. A similar finding is noted peripherally in the anterior aspect of the left basal ganglia. There is no acute intracranial hemorrhage nor evidence of acute ischemic change. There is subtle decreased density in the deep white matter of both cerebral hemispheres consistent with chronic  small vessel ischemia. The cerebellum and brainstem are normal.  The observed paranasal sinuses and mastoid air cells are clear. There is no acute skull fracture or lytic or blastic calvarial lesion.  IMPRESSION: 1. There is no acute intracranial hemorrhage nor evidence of acute ischemic change. 2. 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 3. There is evidence of chronic small vessel ischemic change.   Electronically Signed   By: David  [place]   On: 03/02/2014 14:37   Mr Brain Wo Contrast  03/02/2014   CLINICAL DATA:  Dizziness. Difficulty walking. Symptoms of 3 months duration.  EXAM: MRI HEAD WITHOUT CONTRAST  TECHNIQUE: Multiplanar, multiecho pulse sequences of the brain and surrounding structures were obtained without intravenous contrast.  COMPARISON:  Head CT 03/02/2014  FINDINGS: Diffusion imaging does not show any acute or subacute infarction. The brainstem and cerebellum are unremarkable. The cerebral hemispheres show chronic small-vessel disease of the deep and subcortical white matter. There is a small right parietal cortical and subcortical infarction and a small right posterior frontal cortical and subcortical infarction. No evidence of mass lesion, hemorrhage, hydrocephalus or extra-axial collection. No pituitary mass. There is chronic inflammatory change of the sphenoid sinus. No skull or skullbase lesion.  IMPRESSION: No acute or reversible finding.  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  Chronic and acute inflammatory changes  of the sphenoid sinus.   Electronically Signed   By: Nelson Chimes M.D.   On: 03/02/2014 18:52   Dg Chest Port 1 View  03/03/2014   CLINICAL DATA:  Hypoxia  EXAM: PORTABLE CHEST - 1 VIEW  COMPARISON:  03/02/2014  FINDINGS: Cardiomegaly again noted. Persistent mild interstitial prominence bilaterally.  Mild edema or pneumonitis cannot be excluded. No segmental infiltrates.  IMPRESSION: Persistent mild interstitial prominence bilaterally. Mild edema or pneumonitis cannot be excluded. No segmental infiltrate.   Electronically Signed   By: Lahoma Crocker M.D.   On: 03/03/2014 13:51    Microbiology: Recent Results (from the past 240 hour(s))  URINE CULTURE     Status: None   Collection Time    03/02/14  3:08 PM      Result Value Ref Range Status   Specimen Description URINE, CATHETERIZED   Final   Special Requests NONE   Final   Culture  Setup Time     Final   Value: 03/03/2014 00:36     Performed at [place]     Final   Value: 70,000 COLONIES/ML     Performed at [facility]   Culture     Final   Value: ESCHERICHIA COLI     Performed at [facility]   Report Status 03/04/2014 FINAL   Final   Organism ID, Bacteria ESCHERICHIA COLI   Final  CULTURE, BLOOD (ROUTINE X 2)     Status: None   Collection Time    03/02/14  7:30 PM      Result Value Ref Range Status   Specimen Description BLOOD RIGHT HAND DRAWN BY RN   Final   Special Requests BOTTLES DRAWN AEROBIC ONLY 5CC   Final   Culture NO GROWTH 3 DAYS   Final   Report Status PENDING   Incomplete  CULTURE, BLOOD (ROUTINE X 2)     Status: None   Collection Time    03/02/14  7:33 PM      Result Value Ref Range Status   Specimen Description BLOOD LEFT HAND   Final   Special Requests BOTTLES DRAWN AEROBIC ONLY 5CC   Final   Culture NO GROWTH 3 DAYS   Final   Report Status PENDING   Incomplete  MRSA PCR SCREENING     Status: None   Collection Time    03/03/14  5:10 AM      Result Value Ref Range Status   MRSA by PCR NEGATIVE  NEGATIVE Final   Comment:            The GeneXpert MRSA Assay (FDA     approved for NASAL specimens     only), is one component of a     comprehensive MRSA colonization     surveillance program. It is not     intended to diagnose MRSA     infection nor to guide or      monitor treatment for     MRSA infections.     Labs: Basic Metabolic Panel:  Recent Labs Lab 03/02/14 1220 03/03/14 0500 03/04/14 0516 03/05/14 0617  NA 133* 136* 136* 139  K 4.7 4.6 4.3 4.8  CL 98 102 101 105  CO2 21 21 21 21   GLUCOSE 299* 201* 131* 177*  BUN 33* 32* 46* 45*  CREATININE 1.52* 1.55* 2.33* 2.01*  CALCIUM 8.3* 8.1* 7.8* 8.4   Liver Function Tests:  Recent Labs Lab 03/02/14 1220 03/03/14  0500  AST 14 15  ALT 7 7  ALKPHOS 82 72  BILITOT 0.5 0.4  PROT 6.9 6.3  ALBUMIN 2.7* 2.4*    Recent Labs Lab 03/02/14 1220  LIPASE 10*   No results found for this basename: AMMONIA,  in the last 168 hours CBC:  Recent Labs Lab 03/02/14 1220 03/03/14 0500 03/04/14 0516 03/05/14 0617  WBC 8.8 8.2 6.9 7.0  NEUTROABS 7.0  --   --   --   HGB 11.6* 10.9* 10.5* 11.0*  HCT 36.1 33.7* 33.4* 34.8*  MCV 81.3 81.8 81.9 82.9  PLT 204 197 188 215   Cardiac Enzymes:  Recent Labs Lab 03/02/14 1220 03/03/14 0500  TROPONINI <0.30 <0.30   BNP: BNP (last 3 results)  Recent Labs  03/02/14 1933  PROBNP 6284.0*   CBG:  Recent Labs Lab 03/04/14 1708 03/04/14 2131 03/05/14 0722 03/05/14 1121 03/05/14 1650  GLUCAP 203* 222* 163* 201* 206*       Signed:  Jola Critzer  [facility] 03/05/2014, 8:07 PM

## 2014-03-05 NOTE — Progress Notes (Signed)
[facility] PROGRESS NOTE  Briana [name] ZSW:[id number] DOB: 04/20/1946 DOA: 03/02/2014 PCP: Antionette Fairy, PA-C  Assessment/Plan: 1. Urinary tract infection. Urinalysis indicative possible infection. She is on intravenous antibiotics. Clinically she appears to be improving. Continue to monitor. Will de escalate antibiotics to rocephin 2. Fever, likely related to #1. No other clear source of infection. resolved 3. Acute encephalopathy. Initially felt to be related to infection/metabolic issues, but began to improve with treatment. Today, patient is increasingly paranoid, refusing medications, refusing physical exam, having auditory hallucinations. Her behavior is somewhat bizarre. Her husband reports that she has suffered from depression in the past, when she lost a grandson approximately 6-8 months ago, but no other mental health issues. Review of home medications shows that she takes valium and xanax at home. She has been ordered xanax prn in the hospital, but has not been getting any. ?if she has some element of benzodiazepine withdrawal. Will start scheduled valium. Will request psychiatry evaluation. 4. Dehydration. Patient received IV fluids in the emergency room. Her chest x-ray indicated possible interstitial edema/pneumonitis. Will give a trial of IV fluids. 5. Recent herpes zoster involving right chest. Lesions have scabbed over. She's completed a course of Valtrex. 6. Generalized weakness. MRI did not show any stroke. Likely related to dehydration/metabolic issues. Appears to be improving. Physical therapy consultation. 7. Brief SVT. Likely related to fever. Appears to have resolved. Echocardiogram unremarkable. She can be monitored on telemetry. 8. Acute renal failure. Possibly related to dehydration and concomitant vancomycin.  Vancomycin discontinued 10/9. Continue IV fluids. Recheck labs in morning. Monitor urine output  Code Status: full code Family Communication:  discussed with husband at the bedside Disposition Plan: discharge home when improved   Consultants:    Procedures: Echo: - Moderate LVH with LVEF 32-20%, grade 2 diastolic dysfunction. Moderate left atrial enlargement. Severely calcified mitral annulus with mild mitral regurgitation. Sclerotic aortic valve with mild aortic regurgitation. Mildly reduced RV contraction. Mild tricuspid regurgitation with evidence of severe pulmonary hypertension, PASP 61 mm mercury.    Antibiotics:  Vancomycin 10/7>>10/9  Zosyn 10/7>>10/10  Rocephin 10/10>>  HPI/Subjective: Patient denies any complaints. She points at clock on wall and says she can stop it with her eyes. Is very paranoid and is reluctant to let me examine, even though I have been seeing her since admission.  Objective: Filed Vitals:   03/05/14 1520  BP: 164/52  Pulse: 54  Temp: 98.3 F (36.8 C)  Resp: 18    Intake/Output Summary (Last 24 hours) at 03/05/14 1826 Last data filed at 03/05/14 1800  Gross per 24 hour  Intake 2466.25 ml  Output    802 ml  Net 1664.25 ml   Filed Weights   03/02/14 2153 03/04/14 0500 03/04/14 1640  Weight: 111.4 kg (245 lb 9.5 oz) 112.3 kg (247 lb 9.2 oz) 112.764 kg (248 lb 9.6 oz)    Exam:   General:  NAD  Cardiovascular: S1, S2 RRR  Respiratory: CTA B  Abdomen: soft, nt, nd, bs+  Musculoskeletal: 1+  edema b/l   Psych: appears very paranoid, having visual hallucinations  Data Reviewed: Basic Metabolic Panel:  Recent Labs Lab 03/02/14 1220 03/03/14 0500 03/04/14 0516 03/05/14 0617  NA 133* 136* 136* 139  K 4.7 4.6 4.3 4.8  CL 98 102 101 105  CO2 21 21 21 21   GLUCOSE 299* 201* 131* 177*  BUN 33* 32* 46* 45*  CREATININE 1.52* 1.55* 2.33* 2.01*  CALCIUM 8.3* 8.1* 7.8* 8.4   Liver  Function Tests:  Recent Labs Lab 03/02/14 1220 03/03/14 0500  AST 14 15  ALT 7 7  ALKPHOS 82 72  BILITOT 0.5 0.4  PROT 6.9 6.3  ALBUMIN 2.7* 2.4*    Recent Labs Lab  03/02/14 1220  LIPASE 10*   No results found for this basename: AMMONIA,  in the last 168 hours CBC:  Recent Labs Lab 03/02/14 1220 03/03/14 0500 03/04/14 0516 03/05/14 0617  WBC 8.8 8.2 6.9 7.0  NEUTROABS 7.0  --   --   --   HGB 11.6* 10.9* 10.5* 11.0*  HCT 36.1 33.7* 33.4* 34.8*  MCV 81.3 81.8 81.9 82.9  PLT 204 197 188 215   Cardiac Enzymes:  Recent Labs Lab 03/02/14 1220 03/03/14 0500  TROPONINI <0.30 <0.30   BNP (last 3 results)  Recent Labs  03/02/14 1933  PROBNP 6284.0*   CBG:  Recent Labs Lab 03/04/14 1708 03/04/14 2131 03/05/14 0722 03/05/14 1121 03/05/14 1650  GLUCAP 203* 222* 163* 201* 206*    Recent Results (from the past 240 hour(s))  URINE CULTURE     Status: None   Collection Time    03/02/14  3:08 PM      Result Value Ref Range Status   Specimen Description URINE, CATHETERIZED   Final   Special Requests NONE   Final   Culture  Setup Time     Final   Value: 03/03/2014 00:36     Performed at [facility]     Final   Value: 70,000 COLONIES/ML     Performed at [facility]   Culture     Final   Value: ESCHERICHIA COLI     Performed at [facility]   Report Status 03/04/2014 FINAL   Final   Organism ID, Bacteria ESCHERICHIA COLI   Final  CULTURE, BLOOD (ROUTINE X 2)     Status: None   Collection Time    03/02/14  7:30 PM      Result Value Ref Range Status   Specimen Description BLOOD RIGHT HAND DRAWN BY RN   Final   Special Requests BOTTLES DRAWN AEROBIC ONLY 5CC   Final   Culture NO GROWTH 3 DAYS   Final   Report Status PENDING   Incomplete  CULTURE, BLOOD (ROUTINE X 2)     Status: None   Collection Time    03/02/14  7:33 PM      Result Value Ref Range Status   Specimen Description BLOOD LEFT HAND   Final   Special Requests BOTTLES DRAWN AEROBIC ONLY 5CC   Final   Culture NO GROWTH 3 DAYS   Final   Report Status PENDING   Incomplete  MRSA PCR SCREENING     Status: None   Collection  Time    03/03/14  5:10 AM      Result Value Ref Range Status   MRSA by PCR NEGATIVE  NEGATIVE Final   Comment:            The GeneXpert MRSA Assay (FDA     approved for NASAL specimens     only), is one component of a     comprehensive MRSA colonization     surveillance program. It is not     intended to diagnose MRSA     infection nor to guide or     monitor treatment for     MRSA infections.     Studies: No results found.  Scheduled  Meds: . aspirin  325 mg Oral Daily  . diazepam  5 mg Oral TID  . gabapentin  300 mg Oral BID  . heparin  5,000 Units Subcutaneous 3 times per day  . insulin aspart  0-15 Units Subcutaneous TID WC  . insulin aspart  0-5 Units Subcutaneous QHS  . piperacillin-tazobactam (ZOSYN)  IV  3.375 g Intravenous Q8H  . pravastatin  40 mg Oral Daily  . sertraline  100 mg Oral Daily  . sodium chloride  3 mL Intravenous Q12H  . sodium chloride  3 mL Intravenous Q12H   Continuous Infusions: . sodium chloride 75 mL/hr at 03/04/14 2219    Principal Problem:   UTI (lower urinary tract infection) Active Problems:   Hx of adenomatous colonic polyps   Acute encephalopathy   Dehydration   Generalized weakness   Morbid obesity   Depressed mood   DM type 2 (diabetes mellitus, type 2)   CKD (chronic kidney disease) stage 3, GFR 30-59 ml/min   Acute renal failure    Time spent: 22mins    MEMON,JEHANZEB  [facility] Pager [phone] If 7PM-7AM, please contact night-coverage at [email or web address], password [facility] 03/05/2014, 6:26 PM  LOS: 3 days

## 2014-03-06 NOTE — Progress Notes (Signed)
Upon entering patient's room, pt's bed alarm sounding, pt found standing at bedside with IV laying on floor.  Pt attempting to get dressed stating "my husband is coming to pick me up".  Pt noted to be disoriented, paranoid in behavior.  Stating "you are not going to stick me again".  Pt assisted back to bed with NT and RN at bedside.  Dr. Roderic Palau paged and made aware.  Nursing supervisor called and made aware.  Came to patient's bedside to assist with re attempting to start IV.  RN instructed to call patient's husband and see if he would be available to come to hospital and help calm patient.  Also, new orders received for IM haldol in absence of IV with instructions to only give if necessary.  Unable to reach husband by telephone.  Neighbor listed as second contact person.  Neighbor made aware of current situation.  Stated she would get in touch with patient's husband.  Nursing supervisor and RN unable to reason with patient regarding severity of illness and need for new IV.  Husband and family friend to bedside.  RN made husband aware of situation.  Verbalized understanding.  After some time of husband and patient speaking, husband states that he is going to take the patient home.  Dr. Roderic Palau called and made aware.  Asked to speak with husband.  Husband states to Dr. Roderic Palau that if staff able to get patient to stay, he would be okay with that.  He does not want to take the patient home at this time.  RN attempted to assist patient back to her room.  Again, unable to reason with patient.  RN made patient aware that if unable to do so, would need to call security to assist.  Pt continues to refuse to go back to her room.  Security called to floor.  RN asked husband specifically if he wanted to take her home AMA or if he wanted her to stay.  States he does want to take her home at this time.  RN and husband filled out [place] papers together.  Husband made aware of the risks involved with patient leaving against medical  advice.  Verbalized understanding.  AMA paper placed on patient's chart.  Pt left floor via WC assisted by husband and NT.  Dr. Roderic Palau called and made aware that patient left AMA.

## 2014-03-07 LAB — CULTURE, BLOOD (ROUTINE X 2)
CULTURE: NO GROWTH
CULTURE: NO GROWTH

## 2014-10-11 ENCOUNTER — Other Ambulatory Visit (HOSPITAL_COMMUNITY): Payer: Self-pay | Admitting: "Endocrinology

## 2014-10-11 DIAGNOSIS — R252 Cramp and spasm: Secondary | ICD-10-CM

## 2014-10-17 ENCOUNTER — Ambulatory Visit (HOSPITAL_COMMUNITY)
Admission: RE | Admit: 2014-10-17 | Discharge: 2014-10-17 | Disposition: A | Discharge status: Home / Self Care | Payer: Medicare Other | Source: Ambulatory Visit | Attending: "Endocrinology | Admitting: "Endocrinology

## 2014-10-17 DIAGNOSIS — E785 Hyperlipidemia, unspecified: Secondary | ICD-10-CM | POA: Diagnosis not present

## 2014-10-17 DIAGNOSIS — R252 Cramp and spasm: Secondary | ICD-10-CM | POA: Insufficient documentation

## 2014-10-17 DIAGNOSIS — I10 Essential (primary) hypertension: Secondary | ICD-10-CM | POA: Insufficient documentation

## 2014-10-17 DIAGNOSIS — Z794 Long term (current) use of insulin: Secondary | ICD-10-CM | POA: Diagnosis not present

## 2014-10-17 DIAGNOSIS — E119 Type 2 diabetes mellitus without complications: Secondary | ICD-10-CM | POA: Insufficient documentation

## 2014-11-15 NOTE — Progress Notes (Signed)
Patient ID: Briana [name], female   DOB: [birth year], 69 y.o.   MRN: [id number]     Cardiology Office Note   Date:  11/18/2014   ID:  Briana, [name] 1945-11-04, MRN [id number]  PCP:  Briana Fairy, PA-C  Cardiologist:   Jenkins Rouge, MD   No chief complaint on file.     History of Present Illness: Briana [name] is a 69 y.o. female who presents for evaluation of CAD  Previous CABG in 2005  CRF;s HTN, elevated lipids and DM.  Reviewed over 66 pages records from [facility]G 05/23/2004:  LIMA to LAD, SVG OM2 and SVG Distal RCA:  Admitted with unstable angina and had LM and ostial RCA disease.  Echo during hospitalization showed normal EF 65% Has had poorly controlled DM with A1c in 9-10 range  Some diabetic nephropathy with baseline cr around 2.0  Seeing new diabetic doctor in town and doing better Weight down 12 lbs Had neuropathy in legs with varicose veins that hurt with ambulation 10/17/14 ABI's non occlusive vascular disease  Had Right CEA Kellie Simmering about 6 years ago with no duplex f/u in 2 years No chest pain.  Active      Past Medical History  Diagnosis Date  . Diabetes mellitus   . Hypertension   . Hyperlipidemia   . Heart murmur   . Peripheral vascular disease   . Arthritis   . Depression with anxiety   . Carotid artery occlusion   . Chronic kidney disease   . Neuropathy   . CAD (coronary artery disease)   . Rheumatic fever   . Sleep apnea   . PAD (peripheral artery disease)   . Adenomatous colon polyp 09/2007    Past Surgical History  Procedure Laterality Date  . Coronary artery bypass graft  2005  . Carotid endarterectomy  10/04/2009    Right CEA  . Tubal ligation    . Polypectomy  02/25/2012    Procedure: POLYPECTOMY;  Surgeon: Danie Binder, MD;  Location: [facility];  Service: Endoscopy;  Laterality: N/A;     Current Outpatient Prescriptions  Medication Sig Dispense Refill  . amLODipine (NORVASC) 10 MG tablet Take 10 mg by mouth daily.    Marland Kitchen  tiZANidine (ZANAFLEX) 4 MG capsule Take 4 mg by mouth 3 (three) times daily as needed for muscle spasms.    Marland Kitchen aspirin EC 325 MG tablet Take 325 mg by mouth daily.    Marland Kitchen atorvastatin (LIPITOR) 80 MG tablet Take 80 mg by mouth.     . cyclobenzaprine (FLEXERIL) 10 MG tablet Take 10 mg by mouth 3 (three) times daily as needed for muscle spasms.     . furosemide (LASIX) 40 MG tablet Take 20 mg by mouth daily.     Marland Kitchen HUMULIN 70/30 (70-30) 100 UNIT/ML injection Inject 50-60 Units into the skin 2 (two) times daily with a meal. 60 units in the morning and 50 units at night.    . hydrALAZINE (APRESOLINE) 25 MG tablet Take 25 mg by mouth 3 (three) times daily.    Marland Kitchen HYDROcodone-acetaminophen (LORTAB) 7.5-500 MG per tablet Take 1 tablet by mouth every 4 (four) hours as needed for pain. For pain    . insulin NPH-regular Human (NOVOLIN 70/30) (70-30) 100 UNIT/ML injection Inject 45-50 Units into the skin 2 (two) times daily. 50 in the morning and 45 at bedtime    . JANUVIA 25 MG tablet     . losartan (COZAAR) 100 MG  tablet Take 100 mg by mouth daily.     . potassium chloride SA (K-DUR,KLOR-CON) 20 MEQ tablet Take 20 mEq by mouth daily.     . sertraline (ZOLOFT) 100 MG tablet Take 200 mg by mouth daily.      No current facility-administered medications for this visit.    Allergies:   Macrobid and Oxycodone    Social History:  The patient  reports that she has never smoked. She has never used smokeless tobacco. She reports that she does not drink alcohol or use illicit drugs.   Family History:  The patient's family history includes Breast cancer in her mother; Diabetes in her father; Heart disease in her brother and father.    ROS:  Please see the history of present illness.   Otherwise, review of systems are positive for none.   All other systems are reviewed and negative.    PHYSICAL EXAM: VS:  BP 136/58 mmHg  Pulse 64  Ht 5\' 6"  (1.676 m)  Wt 103.874 kg (229 lb)  BMI 36.98 kg/m2  SpO2 96% , BMI Body  mass index is 36.98 kg/(m^2). Affect appropriate Obese white female  HEENT: normal Neck supple with no adenopathy JVP normal bilateral bruits no thyromegaly Right CEA  Lungs clear with no wheezing and good diaphragmatic motion Heart:  S1/S2SEM  murmur, no rub, gallop or click PMI normal Abdomen: benighn, BS positve, no tenderness, no AAA no bruit.  No HSM or HJR Distal pulses intact with no bruits Plus one bilateral edema Neuro non-focal Skin warm and dry No muscular weakness    EKG:   03/03/14  SR rate 68 RBBB     Recent Labs: 03/02/2014: Pro B Natriuretic peptide (BNP) 6284.0* 03/03/2014: ALT 7; TSH 0.874 03/05/2014: BUN 45*; Creatinine, Ser 2.01*; Hemoglobin 11.0*; Platelets 215; Potassium 4.8; Sodium 139    Lipid Panel    Component Value Date/Time   CHOL  04/30/2009 0323    142        ATP III CLASSIFICATION:  <200     mg/dL   Desirable  200-239  mg/dL   Borderline High  >=240    mg/dL   High          TRIG 151* 04/30/2009 0323   HDL 41 04/30/2009 0323   CHOLHDL 3.5 04/30/2009 0323   VLDL 30 04/30/2009 0323   LDLCALC  04/30/2009 0323    71        Total Cholesterol/HDL:CHD Risk Coronary Heart Disease Risk Table                     Men   Women  1/2 Average Risk   3.4   3.3  Average Risk       5.0   4.4  2 X Average Risk   9.6   7.1  3 X Average Risk  23.4   11.0        Use the calculated Patient Ratio above and the CHD Risk Table to determine the patient's CHD Risk.        ATP III CLASSIFICATION (LDL):  <100     mg/dL   Optimal  100-129  mg/dL   Near or Above                    Optimal  130-159  mg/dL   Borderline  160-189  mg/dL   High  >190     mg/dL   Very High  Wt Readings from Last 3 Encounters:  11/18/14 103.874 kg (229 lb)  03/04/14 112.764 kg (248 lb 9.6 oz)  02/14/14 112.038 kg (247 lb)      Other studies Reviewed: Additional studies/ records that were reviewed today include: Epic notes, office notes endocrine, VVS and primary  .    ASSESSMENT AND PLAN:  1.  CAD:  Post CABG 2005 no angina active continue medical Rx 2.  PVD:  Carotid bruits previous RCEA  F/u duplex ASA  LE pulses palpable no occlusive vascular disease 3. Edema:  Continue last dependant from obesity and previous SVG harvest from CABG 4. Murmur: AV murmur echo to assess sclerosis vs stenosis 5. DM:  Discussed low carb diet.  Target hemoglobin A1c is 6.5 or less.  Continue current medications. 6. CRF:  F/u labs primary baseline cr about 2 7 Chol:  On statin   Current medicines are reviewed at length with the patient today.  The patient does not have concerns regarding medicines.  The following changes have been made:  no change  Labs/ tests ordered today include: Echo and Carotid Duplex  No orders of the defined types were placed in this encounter.     Disposition:   FU with Linna Hoff MD 6 months      Signed, Jenkins Rouge, MD  11/18/2014 10:55 AM    [facility] [place], [place], [place]  [address] Phone: [phone]; Fax: [phone]

## 2014-11-18 ENCOUNTER — Ambulatory Visit (INDEPENDENT_AMBULATORY_CARE_PROVIDER_SITE_OTHER): Payer: Medicare Other | Admitting: Cardiovascular Disease

## 2014-11-18 ENCOUNTER — Encounter: Payer: Self-pay | Admitting: Cardiovascular Disease

## 2014-11-18 VITALS — BP 136/58 | HR 64 | Ht 66.0 in | Wt 229.0 lb

## 2014-11-18 DIAGNOSIS — I350 Nonrheumatic aortic (valve) stenosis: Secondary | ICD-10-CM | POA: Diagnosis not present

## 2014-11-18 DIAGNOSIS — R0989 Other specified symptoms and signs involving the circulatory and respiratory systems: Secondary | ICD-10-CM | POA: Diagnosis not present

## 2014-11-18 DIAGNOSIS — R011 Cardiac murmur, unspecified: Secondary | ICD-10-CM

## 2014-11-18 NOTE — Patient Instructions (Signed)
Your physician wants you to follow-up in: 6 months You will receive a reminder letter in the mail two months in advance. If you don't receive a letter, please call our office to schedule the follow-up appointment.    Your physician recommends that you continue on your current medications as directed. Please refer to the Current Medication list given to you today.   Your physician has requested that you have an echocardiogram. Echocardiography is a painless test that uses sound waves to create images of your heart. It provides your doctor with information about the size and shape of your heart and how well your heart's chambers and valves are working. This procedure takes approximately one hour. There are no restrictions for this procedure.  Your physician has requested that you have a carotid duplex. This test is an ultrasound of the carotid arteries in your neck. It looks at blood flow through these arteries that supply the brain with blood. Allow one hour for this exam. There are no restrictions or special instructions.   Thank you for choosing [facility] !  

## 2014-11-24 ENCOUNTER — Ambulatory Visit (HOSPITAL_COMMUNITY)
Admission: RE | Admit: 2014-11-24 | Discharge: 2014-11-24 | Disposition: A | Discharge status: Home / Self Care | Payer: Medicare Other | Source: Ambulatory Visit | Attending: Cardiovascular Disease | Admitting: Cardiovascular Disease

## 2014-11-24 DIAGNOSIS — R011 Cardiac murmur, unspecified: Secondary | ICD-10-CM | POA: Diagnosis not present

## 2014-11-24 DIAGNOSIS — R0989 Other specified symptoms and signs involving the circulatory and respiratory systems: Secondary | ICD-10-CM

## 2014-11-24 DIAGNOSIS — I350 Nonrheumatic aortic (valve) stenosis: Secondary | ICD-10-CM

## 2014-12-12 LAB — HEMOGLOBIN A1C: Hgb A1c MFr Bld: 6.8 % — AB (ref 4.0–6.0)

## 2015-03-20 LAB — HEMOGLOBIN A1C: HEMOGLOBIN A1C: 6.4 % — AB (ref 4.0–6.0)

## 2015-03-23 ENCOUNTER — Ambulatory Visit: Payer: Self-pay | Admitting: "Endocrinology

## 2015-03-28 ENCOUNTER — Encounter: Payer: Self-pay | Admitting: "Endocrinology

## 2015-03-28 ENCOUNTER — Ambulatory Visit (INDEPENDENT_AMBULATORY_CARE_PROVIDER_SITE_OTHER): Payer: Medicare Other | Admitting: "Endocrinology

## 2015-03-28 VITALS — BP 185/68 | HR 67 | Ht 66.0 in | Wt 225.0 lb

## 2015-03-28 DIAGNOSIS — E559 Vitamin D deficiency, unspecified: Secondary | ICD-10-CM

## 2015-03-28 DIAGNOSIS — N183 Chronic kidney disease, stage 3 (moderate): Secondary | ICD-10-CM

## 2015-03-28 DIAGNOSIS — E785 Hyperlipidemia, unspecified: Secondary | ICD-10-CM

## 2015-03-28 DIAGNOSIS — Z794 Long term (current) use of insulin: Secondary | ICD-10-CM

## 2015-03-28 DIAGNOSIS — I10 Essential (primary) hypertension: Secondary | ICD-10-CM | POA: Diagnosis not present

## 2015-03-28 DIAGNOSIS — E1122 Type 2 diabetes mellitus with diabetic chronic kidney disease: Secondary | ICD-10-CM | POA: Diagnosis not present

## 2015-03-28 DIAGNOSIS — E782 Mixed hyperlipidemia: Secondary | ICD-10-CM | POA: Insufficient documentation

## 2015-03-28 MED ORDER — INSULIN NPH ISOPHANE & REGULAR (70-30) 100 UNIT/ML ~~LOC~~ SUSP: 30.0000 [IU] | Freq: Two times a day (BID) | SUBCUTANEOUS | Status: DC

## 2015-03-28 MED ORDER — JANUVIA 25 MG PO TABS: 25.0000 mg | ORAL_TABLET | Freq: Every day | ORAL | Status: DC

## 2015-03-28 NOTE — Patient Instructions (Signed)

## 2015-03-28 NOTE — Progress Notes (Signed)
Subjective:    Patient ID: Briana [name], female    DOB: 1945-12-09,    Past Medical History  Diagnosis Date  . Diabetes mellitus   . Hypertension   . Hyperlipidemia   . Heart murmur   . Peripheral vascular disease ([place])   . Arthritis   . Depression with anxiety   . Carotid artery occlusion   . Chronic kidney disease   . Neuropathy ([place])   . CAD (coronary artery disease)   . Rheumatic fever   . Sleep apnea   . PAD (peripheral artery disease) ([place])   . Adenomatous colon polyp 09/2007   Past Surgical History  Procedure Laterality Date  . Coronary artery bypass graft  2005  . Carotid endarterectomy  10/04/2009    Right CEA  . Tubal ligation    . Polypectomy  02/25/2012    Procedure: POLYPECTOMY;  Surgeon: Danie Binder, MD;  Location: [facility];  Service: Endoscopy;  Laterality: N/A;   Social History   Social History  . Marital Status: Married    Spouse Name: N/A  . Number of Children: N/A  . Years of Education: N/A   Social History Main Topics  . Smoking status: Never Smoker   . Smokeless tobacco: Never Used  . Alcohol Use: No  . Drug Use: No  . Sexual Activity: Not Asked   Other Topics Concern  . None   Social History Narrative   Outpatient Encounter Prescriptions as of 03/28/2015  Medication Sig  . amLODipine (NORVASC) 10 MG tablet Take 10 mg by mouth daily.  Marland Kitchen aspirin EC 325 MG tablet Take 325 mg by mouth daily.  Marland Kitchen atorvastatin (LIPITOR) 80 MG tablet Take 80 mg by mouth.   . cholecalciferol (VITAMIN D) 1000 UNITS tablet Take 1,000 Units by mouth daily.  . cyclobenzaprine (FLEXERIL) 10 MG tablet Take 10 mg by mouth 3 (three) times daily as needed for muscle spasms.   . diazepam (VALIUM) 5 MG tablet Take 5 mg by mouth at bedtime.  . furosemide (LASIX) 40 MG tablet Take 20 mg by mouth daily.   Marland Kitchen gabapentin (NEURONTIN) 300 MG capsule Take 300 mg by mouth at bedtime.  . hydrALAZINE (APRESOLINE) 25 MG tablet Take 50 mg by mouth 3 (three) times daily.   Marland Kitchen  HYDROcodone-acetaminophen (LORTAB) 7.5-500 MG per tablet Take 1 tablet by mouth every 4 (four) hours as needed for pain. For pain  . JANUVIA 25 MG tablet Take 1 tablet (25 mg total) by mouth daily.  Marland Kitchen losartan (COZAAR) 100 MG tablet Take 100 mg by mouth daily.   . potassium chloride SA (K-DUR,KLOR-CON) 20 MEQ tablet Take 20 mEq by mouth daily.   . sertraline (ZOLOFT) 100 MG tablet Take 200 mg by mouth daily.   Marland Kitchen tiZANidine (ZANAFLEX) 4 MG capsule Take 4 mg by mouth 3 (three) times daily as needed for muscle spasms.  . [DISCONTINUED] ergocalciferol (VITAMIN D2) 50000 UNITS capsule Take 50,000 Units by mouth once a week.  . [DISCONTINUED] HUMULIN 70/30 (70-30) 100 UNIT/ML injection Inject into the skin 2 (two) times daily with a meal. 60 units in the morning and 50 units at night.  . [DISCONTINUED] JANUVIA 25 MG tablet   . insulin NPH-regular Human (NOVOLIN 70/30) (70-30) 100 UNIT/ML injection Inject 30 Units into the skin 2 (two) times daily before a meal.  . [DISCONTINUED] insulin NPH-regular Human (NOVOLIN 70/30) (70-30) 100 UNIT/ML injection Inject 30 Units into the skin 2 (two) times daily before a  meal.   No facility-administered encounter medications on file as of 03/28/2015.   ALLERGIES: Allergies  Allergen Reactions  . Carbamazepine   . Clonazepam   . Gemfibrozil   . Macrobid [Nitrofurantoin] Nausea And Vomiting  . Oxycodone    VACCINATION STATUS: Immunization History  Administered Date(s) Administered  . Influenza,inj,Quad PF,36+ Mos 03/03/2014    Diabetes She presents for her follow-up diabetic visit. She has type 2 diabetes mellitus. Onset time: She was diagnosed at approximate age of 54 years. Her disease course has been improving. There are no hypoglycemic associated symptoms. Pertinent negatives for hypoglycemia include no confusion, headaches, pallor or seizures. There are no diabetic associated symptoms. Pertinent negatives for diabetes include no chest pain, no fatigue,  no polydipsia, no polyphagia and no polyuria. There are no hypoglycemic complications. Symptoms are improving. Diabetic complications include nephropathy. Risk factors for coronary artery disease include dyslipidemia, diabetes mellitus, hypertension, obesity and sedentary lifestyle. Current diabetic treatment includes insulin injections. She is compliant with treatment most of the time. Her weight is stable. She is following a generally unhealthy diet. She rarely participates in exercise. Her overall blood glucose range is 140-180 mg/dl. An ACE inhibitor/angiotensin II receptor blocker is being taken. Eye exam is current.  Hyperlipidemia This is a chronic problem. The current episode started more than 1 year ago. Exacerbating diseases include diabetes and obesity. Pertinent negatives include no chest pain, myalgias or shortness of breath. Current antihyperlipidemic treatment includes statins. Risk factors for coronary artery disease include diabetes mellitus, dyslipidemia, hypertension, obesity and a sedentary lifestyle.  Hypertension This is a chronic problem. The current episode started more than 1 year ago. The problem is uncontrolled. Pertinent negatives include no chest pain, headaches, palpitations or shortness of breath. Past treatments include ACE inhibitors and calcium channel blockers. Hypertensive end-organ damage includes kidney disease.     Review of Systems  Constitutional: Negative for fatigue and unexpected weight change.  HENT: Negative for trouble swallowing and voice change.   Eyes: Negative for visual disturbance.  Respiratory: Negative for cough, shortness of breath and wheezing.   Cardiovascular: Negative for chest pain, palpitations and leg swelling.  Gastrointestinal: Negative for nausea, vomiting and diarrhea.  Endocrine: Negative for cold intolerance, heat intolerance, polydipsia, polyphagia and polyuria.  Musculoskeletal: Negative for myalgias and arthralgias.  Skin:  Negative for color change, pallor, rash and wound.  Neurological: Negative for seizures and headaches.  Psychiatric/Behavioral: Negative for suicidal ideas and confusion.    Objective:    BP 185/68 mmHg  Pulse 67  Ht 5\' 6"  (1.676 m)  Wt 225 lb (102.059 kg)  BMI 36.33 kg/m2  SpO2 99%  Wt Readings from Last 3 Encounters:  03/28/15 225 lb (102.059 kg)  11/18/14 229 lb (103.874 kg)  03/04/14 248 lb 9.6 oz (112.764 kg)    Physical Exam  Constitutional: She is oriented to person, place, and time. She appears well-developed.  HENT:  Head: Normocephalic and atraumatic.  Eyes: EOM are normal.  Neck: Normal range of motion. Neck supple. No tracheal deviation present. No thyromegaly present.  Cardiovascular: Normal rate and regular rhythm.   Pulmonary/Chest: Effort normal and breath sounds normal.  Abdominal: Soft. Bowel sounds are normal. There is no tenderness. There is no guarding.  Musculoskeletal: Normal range of motion. She exhibits no edema.  Neurological: She is alert and oriented to person, place, and time. She has normal reflexes. No cranial nerve deficit. Coordination normal.  Skin: Skin is warm and dry. No rash noted. No erythema. No pallor.  Psychiatric: She has a normal mood and affect. Judgment normal.    Results for orders placed or performed in visit on 03/28/15  Hemoglobin A1c  Result Value Ref Range   Hgb A1c MFr Bld 6.4 (A) 4.0 - 6.0 %  Hemoglobin A1c  Result Value Ref Range   Hgb A1c MFr Bld 6.8 (A) 4.0 - 6.0 %   Complete Blood Count (Most recent): Lab Results  Component Value Date   WBC 7.0 03/05/2014   HGB 11.0* 03/05/2014   HCT 34.8* 03/05/2014   MCV 82.9 03/05/2014   PLT 215 03/05/2014   Chemistry (most recent): Lab Results  Component Value Date   NA 139 03/05/2014   K 4.8 03/05/2014   CL 105 03/05/2014   CO2 21 03/05/2014   BUN 45* 03/05/2014   CREATININE 2.01* 03/05/2014   Diabetic Labs (most recent): Lab Results  Component Value Date    HGBA1C 6.4* 03/20/2015   HGBA1C 6.8* 12/12/2014   HGBA1C 8.5* 03/03/2014   Lipid profile (most recent): Lab Results  Component Value Date   TRIG 151* 04/30/2009   CHOL  04/30/2009    142        ATP III CLASSIFICATION:  <200     mg/dL   Desirable  200-239  mg/dL   Borderline High  >=240    mg/dL   High                Assessment & Plan:   1. Type 2 diabetes mellitus with stage 3 chronic kidney disease, with long-term current use of insulin (HCC)  -Her diabetes is  complicated by chronic kidney disease and patient remains at a high risk for more acute and chronic complications of diabetes which include CAD, CVA, CKD, retinopathy, and neuropathy. These are all discussed in detail with the patient.  Patient came with improved glucose profile, and  recent A1c of 6.4 %, generally improved from 9.5%.  Glucose logs and insulin administration records pertaining to this visit,  to be scanned into patient's records.  Recent labs reviewed.   - I have re-counseled the patient on diet management and weight loss  by adopting a carbohydrate restricted / protein rich  Diet.  - Suggestion is made for patient to avoid simple carbohydrates   from their diet including Cakes , Desserts, Ice Cream,  Soda (  diet and regular) , Sweet Tea , Candies,  Chips, Cookies, Artificial Sweeteners,   and "Sugar-free" Products .  This will help patient to have stable blood glucose profile and potentially avoid unintended  Weight gain.  - Patient is advised to stick to a routine mealtimes to eat 3 meals  a day and avoid unnecessary snacks ( to snack only to correct hypoglycemia).  - The patient has declined a visit with CDE for individualized DM education.  - I have approached patient with the following individualized plan to manage diabetes and patient agrees.  - I will continue  Novolin 70/30 30 units with Breakfast and supper when glucose is above 90, associated with strict monitoring of glucose AC and HS.   -Patient is warned not to take insulin without proper monitoring per orders.  -Patient is encouraged to call clinic for blood glucose levels less than 70 or above 300 mg /dl. - She declined referral to CDE. - I will continue Januvia 25 mg po qday. -Patient is not a candidate for MTF, SGLT2 i due to CKD.   - Patient specific target  for A1c; LDL, HDL,  Triglycerides, and  Waist Circumference were discussed in detail.  2) BP/HTN: uncontrolled,advised her to Continue current medications including ACEI/ARB. 3) Lipids/HPL:  continue statins. 4)  Weight/Diet: declined CDE consult, exercise, and carbohydrates information provided.  5. Vitamin D deficiency -I advised her to continue with vitamin D 2000 units daily. 6) Chronic Care/Health Maintenance:  -Patient is on ACEI/ARB and Statin medications and encouraged to continue to follow up with Ophthalmology, Podiatrist at least yearly or according to recommendations, and advised to  stay away from smoking. I have recommended yearly flu vaccine and pneumonia vaccination at least every 5 years; moderate intensity exercise for up to 150 minutes weekly; and  sleep for at least 7 hours a day.  I advised patient to maintain close follow up with their PCP for primary care needs.  Patient is asked to bring meter and  blood glucose logs during their next visit.   Follow up plan: Return in about 3 months (around 06/28/2015) for diabetes, high blood pressure, high cholesterol.  Glade Lloyd, MD Phone: [phone]  Fax: [phone]   03/28/2015, 5:01 PM

## 2015-05-10 ENCOUNTER — Encounter: Payer: Self-pay | Admitting: Cardiovascular Disease

## 2015-05-10 ENCOUNTER — Ambulatory Visit (INDEPENDENT_AMBULATORY_CARE_PROVIDER_SITE_OTHER): Payer: Medicare Other | Admitting: Cardiovascular Disease

## 2015-05-10 VITALS — BP 164/86 | HR 87 | Ht 65.0 in | Wt 229.0 lb

## 2015-05-10 DIAGNOSIS — E785 Hyperlipidemia, unspecified: Secondary | ICD-10-CM

## 2015-05-10 DIAGNOSIS — I10 Essential (primary) hypertension: Secondary | ICD-10-CM | POA: Diagnosis not present

## 2015-05-10 DIAGNOSIS — I25708 Atherosclerosis of coronary artery bypass graft(s), unspecified, with other forms of angina pectoris: Secondary | ICD-10-CM | POA: Diagnosis not present

## 2015-05-10 DIAGNOSIS — R600 Localized edema: Secondary | ICD-10-CM

## 2015-05-10 DIAGNOSIS — R0789 Other chest pain: Secondary | ICD-10-CM

## 2015-05-10 DIAGNOSIS — I779 Disorder of arteries and arterioles, unspecified: Secondary | ICD-10-CM | POA: Diagnosis not present

## 2015-05-10 DIAGNOSIS — I5033 Acute on chronic diastolic (congestive) heart failure: Secondary | ICD-10-CM

## 2015-05-10 DIAGNOSIS — I739 Peripheral vascular disease, unspecified: Secondary | ICD-10-CM

## 2015-05-10 MED ORDER — METOPROLOL TARTRATE 25 MG PO TABS: 25.0000 mg | ORAL_TABLET | Freq: Two times a day (BID) | ORAL | Status: DC

## 2015-05-10 MED ORDER — HYDRALAZINE HCL 50 MG PO TABS: 75.0000 mg | ORAL_TABLET | Freq: Three times a day (TID) | ORAL | Status: DC

## 2015-05-10 NOTE — Progress Notes (Signed)
Patient ID: Briana [name], female   DOB: Oct 10, 1945, 69 y.o.   MRN: [id number]      SUBJECTIVE: The patient presents for follow-up of coronary artery disease and prior CABG in 2005. She also has hypertension, diabetes, and hyperlipidemia. She underwent CABG on 05/23/2004 with LIMA to LAD, SVG to OM 2, and SVG to distal RCA. She saw Dr. Johnsie Cancel on 11/15/14.  Echocardiogram on 11/24/14 showed vigorous left ventricular systolic function, EF [id number], severe LVH, grade 2 diastolic dysfunction, elevated filling pressures, moderate left atrial enlargement, severe mitral annular calcification with mild mitral regurgitation, moderate aortic valve sclerosis with no stenosis, mild aortic regurgitation, mild right atrial enlargement, and moderate to severe pulmonary hypertension with PASP 56 mmHg.  Carotid Dopplers on 11/24/14 demonstrated proximal left internal carotid artery stenosis greater than 70% and less than 50% right internal carotid artery stenosis.  For the past 3 months, she has been experiencing progressive fatigue. She has also been experiencing chest tightness with exertion but never at rest. She wonders if she has developed an allergy to her dog. She has also been having more leg swelling today but not recently.  ECG performed in the office today demonstrates sinus rhythm with a right bundle branch block and probable right ventricular hypertrophy.  Review of Systems: As per "subjective", otherwise negative.  Allergies  Allergen Reactions  . Carbamazepine   . Clonazepam   . Gemfibrozil   . Macrobid [Nitrofurantoin] Nausea And Vomiting  . Oxycodone     Current Outpatient Prescriptions  Medication Sig Dispense Refill  . amLODipine (NORVASC) 10 MG tablet Take 10 mg by mouth daily.    Marland Kitchen aspirin EC 325 MG tablet Take 325 mg by mouth daily.    Marland Kitchen atorvastatin (LIPITOR) 80 MG tablet Take 80 mg by mouth.     . cholecalciferol (VITAMIN D) 1000 UNITS tablet Take 1,000 Units by mouth daily.      . diazepam (VALIUM) 5 MG tablet Take 5 mg by mouth at bedtime.    . furosemide (LASIX) 40 MG tablet Take 20 mg by mouth daily.     . hydrALAZINE (APRESOLINE) 25 MG tablet Take 50 mg by mouth 3 (three) times daily.     Marland Kitchen HYDROcodone-acetaminophen (LORTAB) 7.5-500 MG per tablet Take 1 tablet by mouth every 4 (four) hours as needed for pain. For pain    . insulin NPH-regular Human (NOVOLIN 70/30) (70-30) 100 UNIT/ML injection Inject 30 Units into the skin 2 (two) times daily before a meal. 2 vial 2  . JANUVIA 25 MG tablet Take 1 tablet (25 mg total) by mouth daily. 30 tablet 2  . losartan (COZAAR) 100 MG tablet Take 100 mg by mouth daily.     . potassium chloride SA (K-DUR,KLOR-CON) 20 MEQ tablet Take 20 mEq by mouth daily.     . sertraline (ZOLOFT) 100 MG tablet Take 200 mg by mouth daily.     Marland Kitchen tiZANidine (ZANAFLEX) 4 MG capsule Take 4 mg by mouth 3 (three) times daily as needed for muscle spasms.     No current facility-administered medications for this visit.    Past Medical History  Diagnosis Date  . Diabetes mellitus   . Hypertension   . Hyperlipidemia   . Heart murmur   . Peripheral vascular disease ([place])   . Arthritis   . Depression with anxiety   . Carotid artery occlusion   . Chronic kidney disease   . Neuropathy ([place])   . CAD (coronary artery disease)   .  Rheumatic fever   . Sleep apnea   . PAD (peripheral artery disease) ([place])   . Adenomatous colon polyp 09/2007    Past Surgical History  Procedure Laterality Date  . Coronary artery bypass graft  2005  . Carotid endarterectomy  10/04/2009    Right CEA  . Tubal ligation    . Polypectomy  02/25/2012    Procedure: POLYPECTOMY;  Surgeon: Danie Binder, MD;  Location: [facility];  Service: Endoscopy;  Laterality: N/A;    Social History   Social History  . Marital Status: Married    Spouse Name: N/A  . Number of Children: N/A  . Years of Education: N/A   Occupational History  . Not on file.   Social History Main  Topics  . Smoking status: Never Smoker   . Smokeless tobacco: Never Used  . Alcohol Use: No  . Drug Use: No  . Sexual Activity: Not on file   Other Topics Concern  . Not on file   Social History Narrative     Filed Vitals:   05/10/15 1245  BP: 164/86  Pulse: 87  Height: 5\' 5"  (1.651 m)  Weight: 229 lb (103.874 kg)  SpO2: 90%    PHYSICAL EXAM General: NAD HEENT: Normal. Neck: No JVD, no thyromegaly. Lungs: Clear to auscultation bilaterally with normal respiratory effort. CV: Nondisplaced PMI.  Regular rate and rhythm, normal S1/S2, no [id number], 2/6 systolic murmur loudest along left sternal border. 1+ pitting pretibial and periankle edema. Bilateral carotid bruits.   Abdomen: Soft, nontender, obese.  Neurologic: Alert and oriented x 3.  Psych: Normal affect. Skin: Normal. Musculoskeletal: No gross deformities. Extremities: No clubbing or cyanosis.   ECG: Most recent ECG reviewed.    ASSESSMENT AND PLAN: 1. CAD with h/o 3-v CABG in 04/2004: CCS class II exertional angina. Will start metoprolol 25 mg bid. Continue ASA and statin therapy. Would consider stress testing in future.  2. Essential HTN: Elevated. Increase hydralazine to 75 mg tid. Already on maximum doses of losartan and amlodipine.  3. Hyperlipidemia: On Lipitor 80 mg.  4. Left internal carotid artery stenosis and s/p right CEA: Will repeat Dopplers in June 2017. Continue ASA and statin.  5. Diastolic dysfunction (grade 2) with high filling pressures: Has leg swelling and elevated BP. Will increase Lasix to 40 mg daily x 3 days and then resume Lasix 20 mg daily. Increase hydralazine as noted above to control BP.  Dispo: f/u 2 months.  Time spent: 40 minutes, of which greater than 50% was spent reviewing symptoms, relevant blood tests and studies, and discussing management plan with the patient.   Kate Sable, M.D., F.A.C.C.

## 2015-05-10 NOTE — Patient Instructions (Signed)
Medication Instructions:  INCREASE HYDRALAZINE TO 75 MG THREE TIMES DAILY (1 1/2 TABLETS- 3 TIMES DAILY) START METOPROLOL 25 MG TWO TIMES DAILY  FOR THE NEXT 3 DAYS TAKE LASIX 40 MG DAILY- THEN CUT BACK TO 20 MG DAILY   Labwork: NONE  Testing/Procedures: NONE  Follow-Up: Your physician recommends that you schedule a follow-up appointment in: 2 MONTHS WITH DR. Bronson Ing    Any Other Special Instructions Will Be Listed Below (If Applicable).     If you need a refill on your cardiac medications before your next appointment, please call your pharmacy.  Thanks for choosing [facility]!!!

## 2015-05-31 ENCOUNTER — Encounter (HOSPITAL_COMMUNITY): Payer: Self-pay | Admitting: *Deleted

## 2015-05-31 ENCOUNTER — Inpatient Hospital Stay (HOSPITAL_COMMUNITY)
Admission: EM | Admit: 2015-05-31 | Discharge: 2015-06-02 | DRG: 291 | Disposition: A | Discharge status: Home / Self Care | Payer: Medicare Other | Attending: Family Medicine | Admitting: Family Medicine

## 2015-05-31 ENCOUNTER — Emergency Department (HOSPITAL_COMMUNITY): Payer: Medicare Other

## 2015-05-31 ENCOUNTER — Other Ambulatory Visit: Payer: Self-pay | Admitting: "Endocrinology

## 2015-05-31 DIAGNOSIS — I272 Other secondary pulmonary hypertension: Secondary | ICD-10-CM | POA: Diagnosis present

## 2015-05-31 DIAGNOSIS — I130 Hypertensive heart and chronic kidney disease with heart failure and stage 1 through stage 4 chronic kidney disease, or unspecified chronic kidney disease: Secondary | ICD-10-CM | POA: Diagnosis present

## 2015-05-31 DIAGNOSIS — N183 Chronic kidney disease, stage 3 unspecified: Secondary | ICD-10-CM | POA: Diagnosis present

## 2015-05-31 DIAGNOSIS — E669 Obesity, unspecified: Secondary | ICD-10-CM | POA: Diagnosis present

## 2015-05-31 DIAGNOSIS — E1151 Type 2 diabetes mellitus with diabetic peripheral angiopathy without gangrene: Secondary | ICD-10-CM | POA: Diagnosis present

## 2015-05-31 DIAGNOSIS — Z7982 Long term (current) use of aspirin: Secondary | ICD-10-CM

## 2015-05-31 DIAGNOSIS — E1122 Type 2 diabetes mellitus with diabetic chronic kidney disease: Secondary | ICD-10-CM | POA: Diagnosis present

## 2015-05-31 DIAGNOSIS — I471 Supraventricular tachycardia: Secondary | ICD-10-CM | POA: Diagnosis present

## 2015-05-31 DIAGNOSIS — Z951 Presence of aortocoronary bypass graft: Secondary | ICD-10-CM

## 2015-05-31 DIAGNOSIS — F418 Other specified anxiety disorders: Secondary | ICD-10-CM | POA: Diagnosis present

## 2015-05-31 DIAGNOSIS — Z9114 Patient's other noncompliance with medication regimen: Secondary | ICD-10-CM | POA: Diagnosis not present

## 2015-05-31 DIAGNOSIS — M1990 Unspecified osteoarthritis, unspecified site: Secondary | ICD-10-CM | POA: Diagnosis present

## 2015-05-31 DIAGNOSIS — R531 Weakness: Secondary | ICD-10-CM | POA: Diagnosis present

## 2015-05-31 DIAGNOSIS — E782 Mixed hyperlipidemia: Secondary | ICD-10-CM | POA: Diagnosis present

## 2015-05-31 DIAGNOSIS — I160 Hypertensive urgency: Secondary | ICD-10-CM | POA: Diagnosis present

## 2015-05-31 DIAGNOSIS — N179 Acute kidney failure, unspecified: Secondary | ICD-10-CM | POA: Diagnosis present

## 2015-05-31 DIAGNOSIS — I2510 Atherosclerotic heart disease of native coronary artery without angina pectoris: Secondary | ICD-10-CM | POA: Diagnosis present

## 2015-05-31 DIAGNOSIS — E1140 Type 2 diabetes mellitus with diabetic neuropathy, unspecified: Secondary | ICD-10-CM | POA: Diagnosis present

## 2015-05-31 DIAGNOSIS — I10 Essential (primary) hypertension: Secondary | ICD-10-CM | POA: Diagnosis not present

## 2015-05-31 DIAGNOSIS — E785 Hyperlipidemia, unspecified: Secondary | ICD-10-CM | POA: Diagnosis not present

## 2015-05-31 DIAGNOSIS — I5033 Acute on chronic diastolic (congestive) heart failure: Secondary | ICD-10-CM | POA: Diagnosis present

## 2015-05-31 DIAGNOSIS — Z794 Long term (current) use of insulin: Secondary | ICD-10-CM | POA: Diagnosis not present

## 2015-05-31 DIAGNOSIS — J9601 Acute respiratory failure with hypoxia: Secondary | ICD-10-CM | POA: Diagnosis not present

## 2015-05-31 DIAGNOSIS — Z6838 Body mass index (BMI) 38.0-38.9, adult: Secondary | ICD-10-CM

## 2015-05-31 DIAGNOSIS — Z79899 Other long term (current) drug therapy: Secondary | ICD-10-CM

## 2015-05-31 DIAGNOSIS — I340 Nonrheumatic mitral (valve) insufficiency: Secondary | ICD-10-CM | POA: Diagnosis present

## 2015-05-31 DIAGNOSIS — J810 Acute pulmonary edema: Secondary | ICD-10-CM | POA: Diagnosis not present

## 2015-05-31 DIAGNOSIS — R7989 Other specified abnormal findings of blood chemistry: Secondary | ICD-10-CM | POA: Diagnosis not present

## 2015-05-31 DIAGNOSIS — G4730 Sleep apnea, unspecified: Secondary | ICD-10-CM | POA: Diagnosis present

## 2015-05-31 LAB — CBC
HCT: 37.5 % (ref 36.0–46.0)
Hemoglobin: 12 g/dL (ref 12.0–15.0)
MCH: 27.6 pg (ref 26.0–34.0)
MCHC: 32 g/dL (ref 30.0–36.0)
MCV: 86.2 fL (ref 78.0–100.0)
PLATELETS: 261 10*3/uL (ref 150–400)
RBC: 4.35 MIL/uL (ref 3.87–5.11)
RDW: 14.5 % (ref 11.5–15.5)
WBC: 8.6 10*3/uL (ref 4.0–10.5)

## 2015-05-31 LAB — URINALYSIS, ROUTINE W REFLEX MICROSCOPIC
Bilirubin Urine: NEGATIVE
GLUCOSE, UA: NEGATIVE mg/dL
Ketones, ur: NEGATIVE mg/dL
LEUKOCYTES UA: NEGATIVE
NITRITE: NEGATIVE
Protein, ur: 100 mg/dL — AB
Specific Gravity, Urine: 1.02 (ref 1.005–1.030)
pH: 5.5 (ref 5.0–8.0)

## 2015-05-31 LAB — URINE MICROSCOPIC-ADD ON

## 2015-05-31 LAB — CBG MONITORING, ED
GLUCOSE-CAPILLARY: 145 mg/dL — AB (ref 65–99)
Glucose-Capillary: 156 mg/dL — ABNORMAL HIGH (ref 65–99)

## 2015-05-31 LAB — BASIC METABOLIC PANEL
ANION GAP: 10 (ref 5–15)
BUN: 28 mg/dL — ABNORMAL HIGH (ref 6–20)
CALCIUM: 8.9 mg/dL (ref 8.9–10.3)
CO2: 22 mmol/L (ref 22–32)
Chloride: 112 mmol/L — ABNORMAL HIGH (ref 101–111)
Creatinine, Ser: 1.64 mg/dL — ABNORMAL HIGH (ref 0.44–1.00)
GFR calc Af Amer: 36 mL/min — ABNORMAL LOW (ref >60)
GFR, EST NON AFRICAN AMERICAN: 31 mL/min — AB (ref >60)
GLUCOSE: 129 mg/dL — AB (ref 65–99)
POTASSIUM: 4.6 mmol/L (ref 3.5–5.1)
SODIUM: 144 mmol/L (ref 135–145)

## 2015-05-31 LAB — TROPONIN I

## 2015-05-31 LAB — BRAIN NATRIURETIC PEPTIDE: B NATRIURETIC PEPTIDE 5: 391 pg/mL — AB (ref 0.0–100.0)

## 2015-05-31 LAB — TSH: TSH: 2.987 u[IU]/mL (ref 0.350–4.500)

## 2015-05-31 MED ORDER — SODIUM CHLORIDE 0.9 % IJ SOLN
3.0000 mL | Freq: Two times a day (BID) | INTRAMUSCULAR | Status: DC
  Administered 2015-05-31 – 2015-06-02 (×3): 3 mL via INTRAVENOUS

## 2015-05-31 MED ORDER — DIAZEPAM 5 MG PO TABS
5.0000 mg | ORAL_TABLET | Freq: Every day | ORAL | Status: DC
  Administered 2015-05-31 – 2015-06-01 (×2): 5 mg via ORAL
  Filled 2015-05-31 (×2): qty 1

## 2015-05-31 MED ORDER — VITAMIN D 1000 UNITS PO TABS
1000.0000 [IU] | ORAL_TABLET | Freq: Two times a day (BID) | ORAL | Status: DC
  Administered 2015-05-31 – 2015-06-02 (×4): 1000 [IU] via ORAL
  Filled 2015-05-31 (×8): qty 1

## 2015-05-31 MED ORDER — POTASSIUM CHLORIDE CRYS ER 20 MEQ PO TBCR
20.0000 meq | EXTENDED_RELEASE_TABLET | Freq: Every day | ORAL | Status: DC
  Administered 2015-06-01 – 2015-06-02 (×2): 20 meq via ORAL
  Filled 2015-05-31 (×3): qty 1

## 2015-05-31 MED ORDER — ONDANSETRON HCL 4 MG PO TABS: 4.0000 mg | ORAL_TABLET | Freq: Four times a day (QID) | ORAL | Status: DC | PRN

## 2015-05-31 MED ORDER — INSULIN ASPART 100 UNIT/ML ~~LOC~~ SOLN: 0.0000 [IU] | Freq: Every day | SUBCUTANEOUS | Status: DC

## 2015-05-31 MED ORDER — SODIUM CHLORIDE 0.9 % IJ SOLN
3.0000 mL | Freq: Two times a day (BID) | INTRAMUSCULAR | Status: DC
Start: 2015-05-31 — End: 2015-06-02
  Administered 2015-05-31 – 2015-06-02 (×3): 3 mL via INTRAVENOUS

## 2015-05-31 MED ORDER — FUROSEMIDE 10 MG/ML IJ SOLN
80.0000 mg | Freq: Two times a day (BID) | INTRAMUSCULAR | Status: DC
  Administered 2015-05-31 – 2015-06-02 (×4): 80 mg via INTRAVENOUS
  Filled 2015-05-31 (×4): qty 8

## 2015-05-31 MED ORDER — INSULIN ASPART 100 UNIT/ML ~~LOC~~ SOLN
0.0000 [IU] | Freq: Three times a day (TID) | SUBCUTANEOUS | Status: DC
  Administered 2015-06-01 (×2): 2 [IU] via SUBCUTANEOUS
  Administered 2015-06-01: 1 [IU] via SUBCUTANEOUS

## 2015-05-31 MED ORDER — INSULIN ASPART PROT & ASPART (70-30 MIX) 100 UNIT/ML ~~LOC~~ SUSP
25.0000 [IU] | Freq: Two times a day (BID) | SUBCUTANEOUS | Status: DC
  Administered 2015-06-01 – 2015-06-02 (×3): 25 [IU] via SUBCUTANEOUS
  Filled 2015-05-31: qty 10

## 2015-05-31 MED ORDER — ONDANSETRON HCL 4 MG/2ML IJ SOLN: 4.0000 mg | Freq: Four times a day (QID) | INTRAMUSCULAR | Status: DC | PRN

## 2015-05-31 MED ORDER — ATORVASTATIN CALCIUM 40 MG PO TABS
80.0000 mg | ORAL_TABLET | Freq: Every evening | ORAL | Status: DC
  Administered 2015-05-31 – 2015-06-01 (×2): 80 mg via ORAL
  Filled 2015-05-31 (×4): qty 2

## 2015-05-31 MED ORDER — FUROSEMIDE 10 MG/ML IJ SOLN
40.0000 mg | Freq: Once | INTRAMUSCULAR | Status: AC
  Administered 2015-05-31: 40 mg via INTRAVENOUS
  Filled 2015-05-31: qty 4

## 2015-05-31 MED ORDER — HYDRALAZINE HCL 25 MG PO TABS: 75.0000 mg | ORAL_TABLET | Freq: Three times a day (TID) | ORAL | Status: DC

## 2015-05-31 MED ORDER — HYDRALAZINE HCL 25 MG PO TABS
ORAL_TABLET | ORAL | Status: AC
  Filled 2015-05-31: qty 3

## 2015-05-31 MED ORDER — HYDRALAZINE HCL 20 MG/ML IJ SOLN
10.0000 mg | Freq: Once | INTRAMUSCULAR | Status: AC
  Administered 2015-05-31: 10 mg via INTRAVENOUS
  Filled 2015-05-31: qty 1

## 2015-05-31 MED ORDER — ASPIRIN EC 325 MG PO TBEC
325.0000 mg | DELAYED_RELEASE_TABLET | Freq: Every day | ORAL | Status: DC
  Administered 2015-06-01: 325 mg via ORAL
  Filled 2015-05-31 (×2): qty 1

## 2015-05-31 MED ORDER — NITROGLYCERIN 2 % TD OINT
1.0000 [in_us] | TOPICAL_OINTMENT | Freq: Once | TRANSDERMAL | Status: AC
  Administered 2015-05-31: 1 [in_us] via TOPICAL
  Filled 2015-05-31: qty 1

## 2015-05-31 MED ORDER — LOSARTAN POTASSIUM 50 MG PO TABS
100.0000 mg | ORAL_TABLET | Freq: Every evening | ORAL | Status: DC
  Administered 2015-05-31 – 2015-06-01 (×2): 100 mg via ORAL
  Filled 2015-05-31 (×4): qty 2

## 2015-05-31 MED ORDER — TIZANIDINE HCL 4 MG PO TABS
4.0000 mg | ORAL_TABLET | Freq: Three times a day (TID) | ORAL | Status: DC | PRN
  Filled 2015-05-31: qty 1

## 2015-05-31 MED ORDER — SERTRALINE HCL 50 MG PO TABS
200.0000 mg | ORAL_TABLET | Freq: Every day | ORAL | Status: DC
  Administered 2015-06-01: 100 mg via ORAL
  Administered 2015-06-02: 200 mg via ORAL
  Filled 2015-05-31 (×2): qty 4

## 2015-05-31 MED ORDER — HYDROCODONE-ACETAMINOPHEN 7.5-325 MG PO TABS: 1.0000 | ORAL_TABLET | Freq: Four times a day (QID) | ORAL | Status: DC | PRN

## 2015-05-31 MED ORDER — ENOXAPARIN SODIUM 30 MG/0.3ML ~~LOC~~ SOLN
30.0000 mg | SUBCUTANEOUS | Status: DC
  Administered 2015-05-31: 30 mg via SUBCUTANEOUS
  Filled 2015-05-31: qty 0.3

## 2015-05-31 MED ORDER — LABETALOL HCL 5 MG/ML IV SOLN
20.0000 mg | Freq: Once | INTRAVENOUS | Status: AC
  Administered 2015-05-31: 20 mg via INTRAVENOUS
  Filled 2015-05-31: qty 4

## 2015-05-31 MED ORDER — AMLODIPINE BESYLATE 5 MG PO TABS
10.0000 mg | ORAL_TABLET | Freq: Every evening | ORAL | Status: DC
  Administered 2015-05-31 – 2015-06-01 (×2): 10 mg via ORAL
  Filled 2015-05-31 (×2): qty 2

## 2015-05-31 MED ORDER — METOPROLOL TARTRATE 25 MG PO TABS
25.0000 mg | ORAL_TABLET | Freq: Two times a day (BID) | ORAL | Status: DC
  Administered 2015-05-31 – 2015-06-02 (×4): 25 mg via ORAL
  Filled 2015-05-31 (×4): qty 1

## 2015-05-31 MED ORDER — HYDRALAZINE HCL 25 MG PO TABS
75.0000 mg | ORAL_TABLET | Freq: Once | ORAL | Status: AC
  Administered 2015-05-31: 75 mg via ORAL
  Filled 2015-05-31: qty 3

## 2015-05-31 NOTE — ED Provider Notes (Signed)
CSN: [id number]     Arrival date & time 05/31/15  1403 History   First MD Initiated Contact with Patient 05/31/15 1805     Chief Complaint  Patient presents with  . Leg Swelling  . Nasal Congestion     (Consider location/radiation/quality/duration/timing/severity/associated sxs/prior Treatment) HPI Comments: Patient here with daughter. She states she has not felt well for the past several months with generalized weakness, fatigue, swollen ankles, rattling in her chest and dyspnea on exertion. Denies any chest pain, cough or fever. Was treated for sinusitis with antibiotics. So her cardiologist 2 weeks ago and her blood pressure medications were adjusted. She was getting blood work done today and her daughter convinced her to come to the ED. She denies any fever. She denies any abdominal pain, nausea or vomiting. She denies any focal weakness, numbness or tingling. She states compliance with her medications including her Lasix. Patient with a history of diabetes, hypertension, hyperlipidemia, CAD status post bypass and CHF. She denies any dizziness or lightheadedness.  The history is provided by the patient and a relative.    Past Medical History  Diagnosis Date  . Diabetes mellitus   . Hypertension   . Hyperlipidemia   . Heart murmur   . Peripheral vascular disease ([place])   . Arthritis   . Depression with anxiety   . Carotid artery occlusion   . Chronic kidney disease   . Neuropathy ([place])   . CAD (coronary artery disease)   . Rheumatic fever   . Sleep apnea   . PAD (peripheral artery disease) ([place])   . Adenomatous colon polyp 09/2007   Past Surgical History  Procedure Laterality Date  . Coronary artery bypass graft  2005  . Carotid endarterectomy  10/04/2009    Right CEA  . Tubal ligation    . Polypectomy  02/25/2012    Procedure: POLYPECTOMY;  Surgeon: Danie Binder, MD;  Location: [facility];  Service: Endoscopy;  Laterality: N/A;   Family History  Problem Relation Age of Onset   . Breast cancer Mother   . Heart disease Father   . Diabetes Father   . Heart disease Brother    Social History  Substance Use Topics  . Smoking status: Never Smoker   . Smokeless tobacco: Never Used  . Alcohol Use: No   OB History    No data available     Review of Systems  Constitutional: Positive for activity change, appetite change and fatigue. Negative for fever.  HENT: Negative for congestion and rhinorrhea.   Respiratory: Positive for shortness of breath. Negative for cough and chest tightness.   Cardiovascular: Positive for leg swelling. Negative for chest pain.  Gastrointestinal: Negative for nausea, vomiting and abdominal pain.  Genitourinary: Negative for dysuria, hematuria, vaginal bleeding and vaginal discharge.  Musculoskeletal: Negative for myalgias and arthralgias.  Neurological: Positive for weakness. Negative for dizziness, light-headedness and headaches.  A complete 10 system review of systems was obtained and all systems are negative except as noted in the HPI and PMH.      Allergies  Carbamazepine; Clonazepam; Gemfibrozil; Macrobid; and Oxycodone  Home Medications   Prior to Admission medications   Medication Sig Start Date End Date Taking? Authorizing Provider  amLODipine (NORVASC) 10 MG tablet Take 10 mg by mouth every evening.    Yes Historical Provider, MD  aspirin EC 325 MG tablet Take 325 mg by mouth daily.   Yes Historical Provider, MD  atorvastatin (LIPITOR) 80 MG tablet Take 80 mg  by mouth every evening.  01/10/14  Yes Historical Provider, MD  cholecalciferol (VITAMIN D) 1000 UNITS tablet Take 1,000 Units by mouth 2 (two) times daily.    Yes Historical Provider, MD  diazepam (VALIUM) 5 MG tablet Take 5 mg by mouth at bedtime. *Will take one tablet daily as needed for anxiety   Yes Historical Provider, MD  furosemide (LASIX) 40 MG tablet Take 40 mg by mouth daily.    Yes Historical Provider, MD  hydrALAZINE (APRESOLINE) 50 MG tablet Take 1.5  tablets (75 mg total) by mouth 3 (three) times daily. 05/10/15  Yes Herminio Commons, MD  HYDROcodone-acetaminophen ([place]) 7.5-325 MG tablet TAKE ONE TABLET BY MOUTH EVERYU 4 HOURS AS NEEDED FOR PAIN 04/11/15  Yes Historical Provider, MD  insulin NPH-regular Human (NOVOLIN 70/30) (70-30) 100 UNIT/ML injection Inject 30 Units into the skin 2 (two) times daily before a meal. Patient taking differently: Inject 25 Units into the skin 2 (two) times daily before a meal.  03/28/15  Yes Cassandria Anger, MD  JANUVIA 25 MG tablet Take 1 tablet (25 mg total) by mouth daily. 03/27/15  Yes Cassandria Anger, MD  losartan (COZAAR) 100 MG tablet Take 100 mg by mouth every evening.  02/23/14  Yes Historical Provider, MD  metoprolol tartrate (LOPRESSOR) 25 MG tablet Take 1 tablet (25 mg total) by mouth 2 (two) times daily. 05/10/15  Yes Herminio Commons, MD  potassium chloride SA (K-DUR,KLOR-CON) 20 MEQ tablet Take 20 mEq by mouth daily.    Yes Historical Provider, MD  sertraline (ZOLOFT) 100 MG tablet Take 200 mg by mouth daily.  02/23/14  Yes Historical Provider, MD  tiZANidine (ZANAFLEX) 4 MG capsule Take 4 mg by mouth 3 (three) times daily as needed for muscle spasms (*Takes at bedtime*).    Yes Historical Provider, MD   BP 186/80 mmHg  Pulse 62  Temp(Src) 97.6 F (36.4 C) (Oral)  Resp 14  Ht 5\' 5"  (1.651 m)  Wt 230 lb (104.327 kg)  BMI 38.27 kg/m2  SpO2 95% Physical Exam  Constitutional: She is oriented to person, place, and time. She appears well-developed and well-nourished. No distress.  HENT:  Head: Normocephalic and atraumatic.  Mouth/Throat: Oropharynx is clear and moist. No oropharyngeal exudate.  Eyes: Conjunctivae and EOM are normal. Pupils are equal, round, and reactive to light.  Neck: Normal range of motion. Neck supple.  No meningismus.  Cardiovascular: Normal rate, regular rhythm and intact distal pulses.   Murmur heard. Pulmonary/Chest: Effort normal. No respiratory  distress. She has rales.  Bibasilar crackles  Abdominal: Soft. There is no tenderness. There is no rebound and no guarding.  Musculoskeletal: Normal range of motion. She exhibits edema. She exhibits no tenderness.  +1 pitting edema bilaterally  Neurological: She is alert and oriented to person, place, and time. No cranial nerve deficit. She exhibits normal muscle tone. Coordination normal.  No ataxia on finger to nose bilaterally. No pronator drift. 5/5 strength throughout. CN 2-12 intact.Equal grip strength. Sensation intact.   Skin: Skin is warm.  Psychiatric: She has a normal mood and affect. Her behavior is normal.  Nursing note and vitals reviewed.   ED Course  Procedures (including critical care time) Labs Review Labs Reviewed  BRAIN NATRIURETIC PEPTIDE - Abnormal; Notable for the following:    B Natriuretic Peptide 391.0 (*)    All other components within normal limits  BASIC METABOLIC PANEL - Abnormal; Notable for the following:    Chloride 112 (*)  Glucose, Bld 129 (*)    BUN 28 (*)    Creatinine, Ser 1.64 (*)    GFR calc non Af Amer 31 (*)    GFR calc Af Amer 36 (*)    All other components within normal limits  URINALYSIS, ROUTINE W REFLEX MICROSCOPIC (NOT AT [facility]) - Abnormal; Notable for the following:    Hgb urine dipstick TRACE (*)    Protein, ur 100 (*)    All other components within normal limits  URINE MICROSCOPIC-ADD ON - Abnormal; Notable for the following:    Squamous Epithelial / LPF 0-5 (*)    Bacteria, UA FEW (*)    All other components within normal limits  CBG MONITORING, ED - Abnormal; Notable for the following:    Glucose-Capillary 145 (*)    All other components within normal limits  CBG MONITORING, ED - Abnormal; Notable for the following:    Glucose-Capillary 156 (*)    All other components within normal limits  CBC  TROPONIN I  TSH  BASIC METABOLIC PANEL    Imaging Review Dg Chest 2 View  05/31/2015  CLINICAL DATA:  Leg swelling.   Shortness of breath and weakness. EXAM: CHEST  2 VIEW COMPARISON:  03/03/2014 FINDINGS: Sequelae of prior CABG are again identified. The cardiac silhouette remains mildly enlarged. There is pulmonary vascular congestion with mild bilateral interstitial prominence which is slightly less than that on the prior study. No confluent airspace opacity, pleural effusion, or pneumothorax is identified. No acute osseous abnormality is identified. IMPRESSION: Mild cardiomegaly, pulmonary vascular congestion, and likely mild interstitial edema. Electronically Signed   By: Logan Bores M.D.   On: 05/31/2015 15:15   I have personally reviewed and evaluated these images and lab results as part of my medical decision-making.   EKG Interpretation   Date/Time:  Wednesday May 31 2015 18:18:01 EST Ventricular Rate:  58 PR Interval:  171 QRS Duration: 157 QT Interval:  496 QTC Calculation: 487 R Axis:   78 Text Interpretation:  Sinus rhythm Right bundle branch block Baseline  wander in lead(s) V1 No significant change was found Confirmed by Wyvonnia Dusky   MD, Nilza Eaker [phone]) on 05/31/2015 6:21:27 PM      MDM   Final diagnoses:  Acute on chronic diastolic congestive heart failure (HCC)  Hypertensive urgency   Several months of generalized weakness, progressively worsening with dyspnea on exertion, fatigue, leg swelling. EKG is unchanged.  Last echocardiogram shows preserved EF with diastolic dysfunction. Patient is hypertensive. States she did not take her evening blood pressure medicines today.  Chest x-ray shows mild interstitial edema. There is no hypoxia with ambulation. She is given a dose of IV Lasix as well as her p.m. blood pressure medications. She has no chest pain.  Patient is ambulatory without desaturation. she states she feels better. There is no evidence of infection in her urine or chest x-ray. Troponin is negative.  Blood pressure has improved to 99991111 systolic. She remains hypertensive. She  likely will require further blood pressure adjustment from her cardiologist. Add nitropaste. Cr is improved from her previous. UA negative.  Persistent hypertension with DOE. Early CHF on CXR.  Lasix, hydralazine, home BP meds given.  Attempts to control patient's BP in the ED were not successful.  Observation d/w DR. Gosrani.  Ezequiel Essex, MD 05/31/15 [id number]

## 2015-05-31 NOTE — H&P (Signed)
[facility] History and Physical  RIDLEY SPIRA [id number] DOB: 1946-04-01 DOA: 05/31/2015  Referring physician: ER PCP: Antionette Fairy, PA-C   Chief Complaint: Weakness, leg swelling.  HPI: Briana [name] is a 70 y.o. female  This is a 70 year old lady who has a history of hypertension, diastolic dysfunction, diabetes and history of coronary artery disease now presents with 2-3 week history of increasing weakness and leg swelling. She was seen by her cardiologist 2 weeks ago and her blood pressure medications were adjusted up as her hypertension was not controlled. She says she is compliant with all her medications. She denies any chest pain, palpitations. She denies any PND or orthopnea. Evaluation in the emergency room shows it to be extremely hypertensive with a systolic blood pressure in the early 200s and chest x-ray suggestive of congestive heart failure. She is now being admitted for further management.   Review of Systems:  Apart from symptoms above, all systems are negative.  Past Medical History  Diagnosis Date  . Diabetes mellitus   . Hypertension   . Hyperlipidemia   . Heart murmur   . Peripheral vascular disease ([place])   . Arthritis   . Depression with anxiety   . Carotid artery occlusion   . Chronic kidney disease   . Neuropathy ([place])   . CAD (coronary artery disease)   . Rheumatic fever   . Sleep apnea   . PAD (peripheral artery disease) ([place])   . Adenomatous colon polyp 09/2007   Past Surgical History  Procedure Laterality Date  . Coronary artery bypass graft  2005  . Carotid endarterectomy  10/04/2009    Right CEA  . Tubal ligation    . Polypectomy  02/25/2012    Procedure: POLYPECTOMY;  Surgeon: Danie Binder, MD;  Location: [facility];  Service: Endoscopy;  Laterality: N/A;   Social History:  reports that she has never smoked. She has never used smokeless tobacco. She reports that she does not drink alcohol or use illicit drugs.  Allergies   Allergen Reactions  . Carbamazepine Other (See Comments)    Unknown reaction-patient is not aware of this allergy  . Clonazepam Other (See Comments)    Patient is not aware of this allergy  . Gemfibrozil Other (See Comments)    Patient is not aware of this allergy but states that she has side effects to a cholesterol medication in the past  . Macrobid [Nitrofurantoin] Nausea And Vomiting  . Oxycodone Other (See Comments)    hallucinations    Family History  Problem Relation Age of Onset  . Breast cancer Mother   . Heart disease Father   . Diabetes Father   . Heart disease Brother       Prior to Admission medications   Medication Sig Start Date End Date Taking? Authorizing Provider  amLODipine (NORVASC) 10 MG tablet Take 10 mg by mouth every evening.    Yes Historical Provider, MD  aspirin EC 325 MG tablet Take 325 mg by mouth daily.   Yes Historical Provider, MD  atorvastatin (LIPITOR) 80 MG tablet Take 80 mg by mouth every evening.  01/10/14  Yes Historical Provider, MD  cholecalciferol (VITAMIN D) 1000 UNITS tablet Take 1,000 Units by mouth 2 (two) times daily.    Yes Historical Provider, MD  diazepam (VALIUM) 5 MG tablet Take 5 mg by mouth at bedtime. *Will take one tablet daily as needed for anxiety   Yes Historical Provider, MD  furosemide (LASIX) 40 MG  tablet Take 40 mg by mouth daily.    Yes Historical Provider, MD  hydrALAZINE (APRESOLINE) 50 MG tablet Take 1.5 tablets (75 mg total) by mouth 3 (three) times daily. 05/10/15  Yes Herminio Commons, MD  HYDROcodone-acetaminophen ([place]) 7.5-325 MG tablet TAKE ONE TABLET BY MOUTH EVERYU 4 HOURS AS NEEDED FOR PAIN 04/11/15  Yes Historical Provider, MD  insulin NPH-regular Human (NOVOLIN 70/30) (70-30) 100 UNIT/ML injection Inject 30 Units into the skin 2 (two) times daily before a meal. Patient taking differently: Inject 25 Units into the skin 2 (two) times daily before a meal.  03/28/15  Yes Cassandria Anger, MD  JANUVIA 25  MG tablet Take 1 tablet (25 mg total) by mouth daily. 03/27/15  Yes Cassandria Anger, MD  losartan (COZAAR) 100 MG tablet Take 100 mg by mouth every evening.  02/23/14  Yes Historical Provider, MD  metoprolol tartrate (LOPRESSOR) 25 MG tablet Take 1 tablet (25 mg total) by mouth 2 (two) times daily. 05/10/15  Yes Herminio Commons, MD  potassium chloride SA (K-DUR,KLOR-CON) 20 MEQ tablet Take 20 mEq by mouth daily.    Yes Historical Provider, MD  sertraline (ZOLOFT) 100 MG tablet Take 200 mg by mouth daily.  02/23/14  Yes Historical Provider, MD  tiZANidine (ZANAFLEX) 4 MG capsule Take 4 mg by mouth 3 (three) times daily as needed for muscle spasms (*Takes at bedtime*).    Yes Historical Provider, MD   Physical Exam: Filed Vitals:   05/31/15 2152 05/31/15 2153 05/31/15 2155 05/31/15 2200  BP:  161/98 201/68 212/60  Pulse:  61 62 61  Temp:      TempSrc:      Resp: 14 14 14    Height:      Weight:      SpO2:  99% 99% 96%    Wt Readings from Last 3 Encounters:  05/31/15 104.327 kg (230 lb)  05/10/15 103.874 kg (229 lb)  03/28/15 102.059 kg (225 lb)    General:  Appears calm and comfortable. Blood pressure elevated to 212/60. Eyes: PERRL, normal lids, irises & conjunctiva ENT: grossly normal hearing, lips & tongue Neck: no LAD, masses or thyromegaly Cardiovascular: Heart sounds are present with a systolic murmur best heard in the aortic area. Jugular venous pressure appears to be elevated to 2 cm above the angle of [place]. There is peripheral pitting edema in her lower legs and ankle area. Telemetry: SR, no arrhythmias  Respiratory: CTA bilaterally, no w/r/r. Normal respiratory effort. Abdomen: soft, ntnd Skin: no rash or induration seen on limited exam Musculoskeletal: grossly normal tone BUE/BLE Psychiatric: grossly normal mood and affect, speech fluent and appropriate Neurologic: grossly non-focal.          Labs on Admission:  Basic Metabolic Panel:  Recent Labs Lab  05/31/15 1522  NA 144  K 4.6  CL 112*  CO2 22  GLUCOSE 129*  BUN 28*  CREATININE 1.64*  CALCIUM 8.9   Liver Function Tests: No results for input(s): AST, ALT, ALKPHOS, BILITOT, PROT, ALBUMIN in the last 168 hours. No results for input(s): LIPASE, AMYLASE in the last 168 hours. No results for input(s): AMMONIA in the last 168 hours. CBC:  Recent Labs Lab 05/31/15 1522  WBC 8.6  HGB 12.0  HCT 37.5  MCV 86.2  PLT 261   Cardiac Enzymes:  Recent Labs Lab 05/31/15 1957  TROPONINI <0.03    BNP (last 3 results)  Recent Labs  05/31/15 1522  BNP 391.0*  ProBNP (last 3 results) No results for input(s): PROBNP in the last 8760 hours.  CBG:  Recent Labs Lab 05/31/15 1834  GLUCAP 145*    Radiological Exams on Admission: Dg Chest 2 View  05/31/2015  CLINICAL DATA:  Leg swelling.  Shortness of breath and weakness. EXAM: CHEST  2 VIEW COMPARISON:  03/03/2014 FINDINGS: Sequelae of prior CABG are again identified. The cardiac silhouette remains mildly enlarged. There is pulmonary vascular congestion with mild bilateral interstitial prominence which is slightly less than that on the prior study. No confluent airspace opacity, pleural effusion, or pneumothorax is identified. No acute osseous abnormality is identified. IMPRESSION: Mild cardiomegaly, pulmonary vascular congestion, and likely mild interstitial edema. Electronically Signed   By: Logan Bores M.D.   On: 05/31/2015 15:15    EKG: Independently reviewed. Sinus rhythm without any acute ST-T wave changes.  Assessment/Plan   1. Diastolic congestive heart failure. She will be treated with intravenous Lasix. Nitropaste. Cardiology consultation. Hold Januvia. Daily weights. Strict input/output measurement. 2. Uncontrolled hypertension. Continue with home medications and use IV Lasix and Nitropaste. 3. Chronic kidney disease. Monitor renal function closely. 4. Diabetes. Continue with home medications and sliding scale  of insulin. 5. Morbid obesity.  She'll be admitted to telemetry. Further recommendations will depend on patient's hospital progress.   Code Status: Full code.  DVT Prophylaxis: Lovenox.  Family Communication: I discussed the plan with the patient at the bedside.   Disposition Plan: Home when medically stable.   Time spent: 60 minutes.  Doree Albee [facility] Pager [phone].

## 2015-05-31 NOTE — ED Notes (Signed)
Pt reports worsening swelling of both ankles for the past 3 months.  Reports sinus congestion x 3 months as well.  Reports today feels generally weak and gets sob with exertion.  BP elevated.

## 2015-05-31 NOTE — ED Notes (Signed)
Ambulated patient with pulse ox.  Patient's o2 sat 92-94%  Patient was able to walk without assistance.

## 2015-05-31 NOTE — ED Notes (Signed)
Pt comes in with chest congestion and swollen ankles that has been progressively getting worse over the past 3 months.  Pt verbalizes she has been to her PCP and was given and antibiotics.

## 2015-06-01 DIAGNOSIS — I5033 Acute on chronic diastolic (congestive) heart failure: Secondary | ICD-10-CM

## 2015-06-01 DIAGNOSIS — E785 Hyperlipidemia, unspecified: Secondary | ICD-10-CM

## 2015-06-01 LAB — GLUCOSE, CAPILLARY
Glucose-Capillary: 139 mg/dL — ABNORMAL HIGH (ref 65–99)
Glucose-Capillary: 184 mg/dL — ABNORMAL HIGH (ref 65–99)
Glucose-Capillary: 184 mg/dL — ABNORMAL HIGH (ref 65–99)
Glucose-Capillary: 160 mg/dL — ABNORMAL HIGH (ref 65–99)

## 2015-06-01 LAB — BASIC METABOLIC PANEL
Anion gap: 10 (ref 5–15)
BUN: 25 mg/dL (ref 7–25)
BUN: 26 mg/dL — AB (ref 6–20)
CALCIUM: 8.7 mg/dL (ref 8.6–10.4)
CALCIUM: 8.9 mg/dL (ref 8.9–10.3)
CO2: 21 mmol/L (ref 20–31)
CO2: 24 mmol/L (ref 22–32)
CREATININE: 1.69 mg/dL — AB (ref 0.44–1.00)
Chloride: 110 mmol/L (ref 98–110)
Chloride: 109 mmol/L (ref 101–111)
Creat: 1.53 mg/dL — ABNORMAL HIGH (ref 0.60–0.93)
GFR calc Af Amer: 34 mL/min — ABNORMAL LOW (ref >60)
GFR, EST NON AFRICAN AMERICAN: 30 mL/min — AB (ref >60)
Glucose, Bld: 106 mg/dL — ABNORMAL HIGH (ref 65–99)
Glucose, Bld: 149 mg/dL — ABNORMAL HIGH (ref 65–99)
Potassium: 4.7 mmol/L (ref 3.5–5.3)
Potassium: 3.8 mmol/L (ref 3.5–5.1)
SODIUM: 142 mmol/L (ref 135–146)
SODIUM: 143 mmol/L (ref 135–145)

## 2015-06-01 LAB — HEMOGLOBIN A1C
HEMOGLOBIN A1C: 6.7 % — AB (ref <5.7)
Mean Plasma Glucose: 146 mg/dL — ABNORMAL HIGH (ref <117)

## 2015-06-01 MED ORDER — ENOXAPARIN SODIUM 40 MG/0.4ML ~~LOC~~ SOLN
40.0000 mg | SUBCUTANEOUS | Status: DC
  Administered 2015-06-01: 40 mg via SUBCUTANEOUS
  Filled 2015-06-01: qty 0.4

## 2015-06-01 MED ORDER — POTASSIUM CHLORIDE CRYS ER 20 MEQ PO TBCR
40.0000 meq | EXTENDED_RELEASE_TABLET | Freq: Once | ORAL | Status: AC
  Administered 2015-06-01: 40 meq via ORAL
  Filled 2015-06-01: qty 2

## 2015-06-01 NOTE — Consult Note (Signed)
Reason for Consult:  Diastolic CHF  Referring Physician: Dr. Anastasio Champion    PCP:  Antionette Fairy, PA-C  Primary Cardiologist:Dr Colette [name] is an 70 y.o. female.    Chief Complaint: admitted 05/30/14 for    HPI:  Asked to see 70 year old female, followed by Dr. [name] for hx of CAD with CABG 2005, HTN, DM, hyperlipidemia.   CABG on 05/23/2004 with LIMA to LAD, SVG to OM 2, and SVG to distal RCA.  Hx .of Rheumatic fever.  Now admitted with increasing weakness, DOE and leg swelling.  Recent uncontrolled HTN non OV,  metoprolol added and lasix increased X 3 days were done on OV.  Lasix did not help much.    Over last 3 day she has increased DOE, "could hardly make it up 3 steps to my house" On arrival to ER BP 701X systolic -CXR with CHF. BNP 391, troponin <0.03.   EKG SR with RBBB similar to previous EKGs.  Denies any chest pain.   Has rec'd IV lasix now 80 mg BID, on amlodipine, losartan, metoprolol -she rec'd labetalol IV and hydralazine and now BP with improved control.  168/37.  outpt meds as below.   Currently BP 168/37 ---- I&O -1400  Last echo: 10/2014   Left ventricle: The cavity size was normal. Wall thickness was increased in a pattern of severe LVH. Systolic function was vigorous. The estimated ejection fraction was in the range of 65% to 70%. Wall motion was normal; there were no regional wall motion abnormalities. Features are consistent with a pseudonormal left ventricular filling pattern, with concomitant abnormal relaxation and increased filling pressure (grade 2 diastolic dysfunction). - Aortic valve: Severely calcified annulus. Trileaflet; mildly calcified leaflets. Transvalvular velocity was mildly increased, likely combination of sclerotic valve and vigorous LV contraction. No definite stenosis. There was mild regurgitation. Mean gradient (S): 13 mm Hg. Valve area (VTI): 2.13 cm^2. - Mitral valve: Severely  calcified annulus. There was mild regurgitation. Valve area by continuity equation (using LVOT flow): 2.05 cm^2. - Left atrium: The atrium was moderately dilated. - Right atrium: The atrium was mildly dilated. Central venous pressure (est): 3 mm Hg. - Atrial septum: No defect or patent foramen ovale was identified. - Tricuspid valve: There was trivial regurgitation. - Pulmonary arteries: Systolic pressure was moderately to severely increased. PA peak pressure: 56 mm Hg (S). - Pericardium, extracardiac: There was no pericardial effusion.  Impressions:  - Severe LVH with LVEF 65-70% and grade 2 diastolic dysfunction with increased filling pressures. Moderate left atrial enlargement. Severe MAC with mild mitral regurgitation. Moderately sclerotic aortic valve with mild aortic regurgitation, no clear evidence of stenosis. Mild right atrial enlargement. Trivial tricuspid regurgitation. Moderate to severe pulmonary hypertension with PASP 56 mmHg.  Last cath 2005 prior to CABG. + carotid disease with Lt ICA 70% stenosis and RT ICA < 50%    Past Medical History  Diagnosis Date  . Diabetes mellitus   . Hypertension   . Hyperlipidemia   . Heart murmur   . Peripheral vascular disease ([place])   . Arthritis   . Depression with anxiety   . Carotid artery occlusion   . Chronic kidney disease   . Neuropathy ([place])   . CAD (coronary artery disease)   . Rheumatic fever   . Sleep apnea   . PAD (peripheral artery disease) ([place])   . Adenomatous colon polyp 09/2007    Past Surgical History  Procedure Laterality Date  . Coronary artery bypass graft  2005  . Carotid endarterectomy  10/04/2009    Right CEA  . Tubal ligation    . Polypectomy  02/25/2012    Procedure: POLYPECTOMY;  Surgeon: Danie Binder, MD;  Location: [facility];  Service: Endoscopy;  Laterality: N/A;    Family History  Problem Relation Age of Onset  . Breast cancer Mother   . Heart disease Father   .  Diabetes Father   . Heart disease Brother    Social History:  reports that she has never smoked. She has never used smokeless tobacco. She reports that she does not drink alcohol or use illicit drugs.  Allergies:  Allergies  Allergen Reactions  . Carbamazepine Other (See Comments)    Unknown reaction-patient is not aware of this allergy  . Clonazepam Other (See Comments)    Patient is not aware of this allergy  . Gemfibrozil Other (See Comments)    Patient is not aware of this allergy but states that she has side effects to a cholesterol medication in the past  . Macrobid [Nitrofurantoin] Nausea And Vomiting  . Oxycodone Other (See Comments)    hallucinations    OUTPATIENT MEDICATIONS: No current facility-administered medications on file prior to encounter.   Current Outpatient Prescriptions on File Prior to Encounter  Medication Sig Dispense Refill  . amLODipine (NORVASC) 10 MG tablet Take 10 mg by mouth every evening.     Marland Kitchen aspirin EC 325 MG tablet Take 325 mg by mouth daily.    Marland Kitchen atorvastatin (LIPITOR) 80 MG tablet Take 80 mg by mouth every evening.     . cholecalciferol (VITAMIN D) 1000 UNITS tablet Take 1,000 Units by mouth 2 (two) times daily.     . diazepam (VALIUM) 5 MG tablet Take 5 mg by mouth at bedtime. *Will take one tablet daily as needed for anxiety    . furosemide (LASIX) 40 MG tablet Take 40 mg by mouth daily.     . hydrALAZINE (APRESOLINE) 50 MG tablet Take 1.5 tablets (75 mg total) by mouth 3 (three) times daily. 270 tablet 3  . insulin NPH-regular Human (NOVOLIN 70/30) (70-30) 100 UNIT/ML injection Inject 30 Units into the skin 2 (two) times daily before a meal. (Patient taking differently: Inject 25 Units into the skin 2 (two) times daily before a meal. ) 2 vial 2  . JANUVIA 25 MG tablet Take 1 tablet (25 mg total) by mouth daily. 30 tablet 2  . losartan (COZAAR) 100 MG tablet Take 100 mg by mouth every evening.     . metoprolol tartrate (LOPRESSOR) 25 MG tablet  Take 1 tablet (25 mg total) by mouth 2 (two) times daily. 180 tablet 3  . potassium chloride SA (K-DUR,KLOR-CON) 20 MEQ tablet Take 20 mEq by mouth daily.     . sertraline (ZOLOFT) 100 MG tablet Take 200 mg by mouth daily.     Marland Kitchen tiZANidine (ZANAFLEX) 4 MG capsule Take 4 mg by mouth 3 (three) times daily as needed for muscle spasms (*Takes at bedtime*).      CURRENT MEDICATIONS: Scheduled Meds: . amLODipine  10 mg Oral QPM  . aspirin EC  325 mg Oral Daily  . atorvastatin  80 mg Oral QPM  . cholecalciferol  1,000 Units Oral BID  . diazepam  5 mg Oral QHS  . enoxaparin (LOVENOX) injection  30 mg Subcutaneous Q24H  . furosemide  80 mg Intravenous Q12H  . insulin aspart  0-5  Units Subcutaneous QHS  . insulin aspart  0-9 Units Subcutaneous TID WC  . insulin aspart protamine- aspart  25 Units Subcutaneous BID WC  . losartan  100 mg Oral QPM  . metoprolol tartrate  25 mg Oral BID  . potassium chloride SA  20 mEq Oral Daily  . sertraline  200 mg Oral Daily  . sodium chloride  3 mL Intravenous Q12H  . sodium chloride  3 mL Intravenous Q12H   Continuous Infusions:  PRN Meds:.HYDROcodone-acetaminophen, ondansetron **OR** ondansetron (ZOFRAN) IV, tiZANidine   Results for orders placed or performed during the hospital encounter of 05/31/15 (from the past 48 hour(s))  TSH     Status: None   Collection Time: 05/31/15  3:15 PM  Result Value Ref Range   TSH 2.987 0.350 - 4.500 uIU/mL  Brain natriuretic peptide     Status: Abnormal   Collection Time: 05/31/15  3:22 PM  Result Value Ref Range   B Natriuretic Peptide 391.0 (H) 0.0 - 100.0 pg/mL  Basic metabolic panel     Status: Abnormal   Collection Time: 05/31/15  3:22 PM  Result Value Ref Range   Sodium 144 135 - 145 mmol/L   Potassium 4.6 3.5 - 5.1 mmol/L   Chloride 112 (H) 101 - 111 mmol/L   CO2 22 22 - 32 mmol/L   Glucose, Bld 129 (H) 65 - 99 mg/dL   BUN 28 (H) 6 - 20 mg/dL   Creatinine, Ser 1.64 (H) 0.44 - 1.00 mg/dL   Calcium 8.9  8.9 - 10.3 mg/dL   GFR calc non Af Amer 31 (L) >60 mL/min   GFR calc Af Amer 36 (L) >60 mL/min    Comment: (NOTE) The eGFR has been calculated using the CKD EPI equation. This calculation has not been validated in all clinical situations. eGFR's persistently <60 mL/min signify possible Chronic Kidney Disease.    Anion gap 10 5 - 15  CBC     Status: None   Collection Time: 05/31/15  3:22 PM  Result Value Ref Range   WBC 8.6 4.0 - 10.5 K/uL   RBC 4.35 3.87 - 5.11 MIL/uL   Hemoglobin 12.0 12.0 - 15.0 g/dL   HCT 37.5 36.0 - 46.0 %   MCV 86.2 78.0 - 100.0 fL   MCH 27.6 26.0 - 34.0 pg   MCHC 32.0 30.0 - 36.0 g/dL   RDW 14.5 11.5 - 15.5 %   Platelets 261 150 - 400 K/uL  CBG monitoring, ED     Status: Abnormal   Collection Time: 05/31/15  6:34 PM  Result Value Ref Range   Glucose-Capillary 145 (H) 65 - 99 mg/dL  Urinalysis, Routine w reflex microscopic (not at [facility])     Status: Abnormal   Collection Time: 05/31/15  6:35 PM  Result Value Ref Range   Color, Urine YELLOW YELLOW   APPearance CLEAR CLEAR   Specific Gravity, Urine 1.020 1.005 - 1.030   pH 5.5 5.0 - 8.0   Glucose, UA NEGATIVE NEGATIVE mg/dL   Hgb urine dipstick TRACE (A) NEGATIVE   Bilirubin Urine NEGATIVE NEGATIVE   Ketones, ur NEGATIVE NEGATIVE mg/dL   Protein, ur 100 (A) NEGATIVE mg/dL   Nitrite NEGATIVE NEGATIVE   Leukocytes, UA NEGATIVE NEGATIVE  Urine microscopic-add on     Status: Abnormal   Collection Time: 05/31/15  6:35 PM  Result Value Ref Range   Squamous Epithelial / LPF 0-5 (A) NONE SEEN   WBC, UA 6-30 0 - 5 WBC/hpf  RBC / HPF 0-5 0 - 5 RBC/hpf   Bacteria, UA FEW (A) NONE SEEN  Troponin I     Status: None   Collection Time: 05/31/15  7:57 PM  Result Value Ref Range   Troponin I <0.03 <0.031 ng/mL    Comment:        NO INDICATION OF MYOCARDIAL INJURY.   CBG monitoring, ED     Status: Abnormal   Collection Time: 05/31/15 10:31 PM  Result Value Ref Range   Glucose-Capillary 156 (H) 65 - 99 mg/dL   Basic metabolic panel     Status: Abnormal   Collection Time: 06/01/15  5:20 AM  Result Value Ref Range   Sodium 143 135 - 145 mmol/L   Potassium 3.8 3.5 - 5.1 mmol/L    Comment: DELTA CHECK NOTED   Chloride 109 101 - 111 mmol/L   CO2 24 22 - 32 mmol/L   Glucose, Bld 149 (H) 65 - 99 mg/dL   BUN 26 (H) 6 - 20 mg/dL   Creatinine, Ser 1.69 (H) 0.44 - 1.00 mg/dL   Calcium 8.9 8.9 - 10.3 mg/dL   GFR calc non Af Amer 30 (L) >60 mL/min   GFR calc Af Amer 34 (L) >60 mL/min    Comment: (NOTE) The eGFR has been calculated using the CKD EPI equation. This calculation has not been validated in all clinical situations. eGFR's persistently <60 mL/min signify possible Chronic Kidney Disease.    Anion gap 10 5 - 15  Glucose, capillary     Status: Abnormal   Collection Time: 06/01/15  8:11 AM  Result Value Ref Range   Glucose-Capillary 139 (H) 65 - 99 mg/dL   Dg Chest 2 View  05/31/2015  CLINICAL DATA:  Leg swelling.  Shortness of breath and weakness. EXAM: CHEST  2 VIEW COMPARISON:  03/03/2014 FINDINGS: Sequelae of prior CABG are again identified. The cardiac silhouette remains mildly enlarged. There is pulmonary vascular congestion with mild bilateral interstitial prominence which is slightly less than that on the prior study. No confluent airspace opacity, pleural effusion, or pneumothorax is identified. No acute osseous abnormality is identified. IMPRESSION: Mild cardiomegaly, pulmonary vascular congestion, and likely mild interstitial edema. Electronically Signed   By: Logan Bores M.D.   On: 05/31/2015 15:15    ROS:   General:no colds though admits to allergies or fevers, no weight changes Skin:no rashes or ulcers HEENT:no blurred vision, no congestion CV:see HPI PUL:see HPI GI:no diarrhea constipation or melena, no indigestion GU:no hematuria, no dysuria MS:no joint pain, no claudication Neuro:no syncope, no lightheadedness Endo:+ diabetes, no thyroid disease   Blood pressure 168/37,  pulse 61, temperature 98.1 F (36.7 C), temperature source Oral, resp. rate 20, height '5\' 5"'  (1.651 m), weight 218 lb (98.884 kg), SpO2 95 %.  Wt Readings from Last 3 Encounters:  06/01/15 218 lb (98.884 kg)  05/10/15 229 lb (103.874 kg)  03/28/15 225 lb (102.059 kg)    PE: General:Pleasant affect, NAD Skin:Warm and dry, brisk capillary refill HEENT:normocephalic, sclera clear, mucus membranes moist Neck:supple, no JVD, + bil. carotid bruits  Heart:S1S2 RRR with 2/6 systolic murmur, no gallup, rub or click Lungs: with rales in bases, no rhonchi, or wheezes JEH:UDJS, non tender, + BS, do not palpate liver spleen or masses Ext:tr to 1+ lower ext edema Lt > rt, 2+ pedal pulses, 2+ radial pulses Neuro:alert and oriented X 3, MAE, follows commands, + facial symmetry  tele: SR with PSVT during the night HR 154  Assessment/Plan  Acute on chronic diastolic Heart Failure--diuresing - negative 1400 overnight would continue IV lasix today and change to po in am.    Accelerated hypertension- improved this AM  pk BP 232/63  Resume home hydralazine ? Renal artery dopplers as outpt with her PAD and CAD.  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)    QPEAKL,TYVDP R  Nurse Practitioner Certified [place] Pager [phone] or after 5pm or weekends call [phone] 06/01/2015, 9:13 AM     Patient examined chart reviewed Improved breathing  with good diuresis Agree with changing to PO lasix  In am and oupatient duplex in [place] to r/o RAS    Jenkins Rouge

## 2015-06-01 NOTE — Care Management Note (Signed)
Case Management Note  Patient Details  Name: Briana [name] MRN: [id number] Date of Birth: 12-17-1945  Subjective/Objective:                  Pt is from home, lives with her husband and is ind with ADL's. Pt has no HH services prior to admission. Pt has a cane for ambulation if she needs it. Pt plans to return home with self care.   Action/Plan: No CM needs anticipated.   Expected Discharge Date:      06/01/2014            Expected Discharge Plan:  Home/Self Care  In-House Referral:  NA  Discharge planning Services  CM Consult, NA  Post Acute Care Choice:  NA Choice offered to:  NA  DME Arranged:    DME Agency:     HH Arranged:    HH Agency:     Status of Service:  Completed, signed off  Medicare Important Message Given:    Date Medicare IM Given:    Medicare IM give by:    Date Additional Medicare IM Given:    Additional Medicare Important Message give by:     If discussed at [place] of Stay Meetings, dates discussed:    Additional Comments:  Sherald Barge, RN 06/01/2015, 8:37 AM

## 2015-06-01 NOTE — Progress Notes (Signed)
[facility] PROGRESS NOTE  Briana [name] [id number] DOB: [birth year] DOA: 05/31/2015 PCP: Antionette Fairy, PA-C  Assessment/Plan: 1. Acute on chronic diastolic CHF. Started on intravenous lasix on admission with clinical improvement. Will continue with current treatments since she still has evidence of volume overload. She has had an echo in the past 6 months which showed normal EF and grade 2 diastolic dysfunction. Will not repeat echo at this time. Cardiology following, appreciate input. Continue to monitor intake and output. Will order HHRN 2. CKD stage 3. Creatinine appears to be at baseline. Will continue to monitor in the setting of diuresis 3. Uncontrolled hypertension. Plans for renal dopplers as outpatient. Blood pressures appear to be improving since admission 4. Diabetes type 2. Continue on NPH insulin and SSI. CBGs are in reasonable range 5. Hyperlipidemia. Continue statin.  Code Status: full code Family Communication: discussed with patient Disposition Plan: likely discharge home in am   Consultants:  cardiology  Procedures:    Antibiotics:    HPI/Subjective: Feels improved. Reports that LE edema is improving. Having frequent urination with lasix  Objective: Filed Vitals:   06/01/15 0530 06/01/15 1049  BP: 168/37   Pulse: 61 67  Temp: 98.1 F (36.7 C)   Resp: 20     Intake/Output Summary (Last 24 hours) at 06/01/15 1303 Last data filed at 06/01/15 0830  Gross per 24 hour  Intake    240 ml  Output   1400 ml  Net  -1160 ml   Filed Weights   05/31/15 1428 06/01/15 0530  Weight: 104.327 kg (230 lb) 98.884 kg (218 lb)    Exam:   General:  NAD  Cardiovascular: CTA B  Respiratory: soft, nt, nd, bs+  Abdomen: no edema b/l  Musculoskeletal: 1-2+ edema b/l   Data Reviewed: Basic Metabolic Panel:  Recent Labs Lab 05/31/15 1332 05/31/15 1522 06/01/15 0520  NA 142 144 143  K 4.7 4.6 3.8  CL 110 112* 109  CO2 21 22 24   GLUCOSE  106* 129* 149*  BUN 25 28* 26*  CREATININE 1.53* 1.64* 1.69*  CALCIUM 8.7 8.9 8.9   Liver Function Tests: No results for input(s): AST, ALT, ALKPHOS, BILITOT, PROT, ALBUMIN in the last 168 hours. No results for input(s): LIPASE, AMYLASE in the last 168 hours. No results for input(s): AMMONIA in the last 168 hours. CBC:  Recent Labs Lab 05/31/15 1522  WBC 8.6  HGB 12.0  HCT 37.5  MCV 86.2  PLT 261   Cardiac Enzymes:  Recent Labs Lab 05/31/15 1957  TROPONINI <0.03   BNP (last 3 results)  Recent Labs  05/31/15 1522  BNP 391.0*    ProBNP (last 3 results) No results for input(s): PROBNP in the last 8760 hours.  CBG:  Recent Labs Lab 05/31/15 1834 05/31/15 2231 06/01/15 0811 06/01/15 1112  GLUCAP 145* 156* 139* 184*    No results found for this or any previous visit (from the past 240 hour(s)).   Studies: Dg Chest 2 View  05/31/2015  CLINICAL DATA:  Leg swelling.  Shortness of breath and weakness. EXAM: CHEST  2 VIEW COMPARISON:  03/03/2014 FINDINGS: Sequelae of prior CABG are again identified. The cardiac silhouette remains mildly enlarged. There is pulmonary vascular congestion with mild bilateral interstitial prominence which is slightly less than that on the prior study. No confluent airspace opacity, pleural effusion, or pneumothorax is identified. No acute osseous abnormality is identified. IMPRESSION: Mild cardiomegaly, pulmonary vascular congestion, and likely mild interstitial edema. Electronically Signed  By: Logan Bores M.D.   On: 05/31/2015 15:15    Scheduled Meds: . amLODipine  10 mg Oral QPM  . aspirin EC  325 mg Oral Daily  . atorvastatin  80 mg Oral QPM  . cholecalciferol  1,000 Units Oral BID  . diazepam  5 mg Oral QHS  . enoxaparin (LOVENOX) injection  30 mg Subcutaneous Q24H  . furosemide  80 mg Intravenous Q12H  . insulin aspart  0-5 Units Subcutaneous QHS  . insulin aspart  0-9 Units Subcutaneous TID WC  . insulin aspart protamine-  aspart  25 Units Subcutaneous BID WC  . losartan  100 mg Oral QPM  . metoprolol tartrate  25 mg Oral BID  . potassium chloride SA  20 mEq Oral Daily  . sertraline  200 mg Oral Daily  . sodium chloride  3 mL Intravenous Q12H  . sodium chloride  3 mL Intravenous Q12H   Continuous Infusions:   Active Problems:   Morbid obesity ([place])   Type 2 diabetes mellitus with stage 3 chronic kidney disease (HCC)   CKD (chronic kidney disease) stage 3, GFR 30-59 ml/min   Essential hypertension, benign   Hyperlipidemia   Acute on chronic diastolic CHF (congestive heart failure) ([place])    Time spent: 7mins    Georgios Kina  [facility] Pager [phone]. If 7PM-7AM, please contact night-coverage at [email or web address], password [facility] 06/01/2015, 1:03 PM  LOS: 1 day

## 2015-06-02 DIAGNOSIS — N183 Chronic kidney disease, stage 3 (moderate): Secondary | ICD-10-CM

## 2015-06-02 DIAGNOSIS — I10 Essential (primary) hypertension: Secondary | ICD-10-CM

## 2015-06-02 LAB — GLUCOSE, CAPILLARY
GLUCOSE-CAPILLARY: 123 mg/dL — AB (ref 65–99)
Glucose-Capillary: 106 mg/dL — ABNORMAL HIGH (ref 65–99)

## 2015-06-02 LAB — BASIC METABOLIC PANEL
ANION GAP: 11 (ref 5–15)
BUN: 42 mg/dL — ABNORMAL HIGH (ref 6–20)
CALCIUM: 8.4 mg/dL — AB (ref 8.9–10.3)
CHLORIDE: 107 mmol/L (ref 101–111)
CO2: 27 mmol/L (ref 22–32)
Creatinine, Ser: 1.99 mg/dL — ABNORMAL HIGH (ref 0.44–1.00)
GFR calc non Af Amer: 24 mL/min — ABNORMAL LOW (ref >60)
GFR, EST AFRICAN AMERICAN: 28 mL/min — AB (ref >60)
GLUCOSE: 117 mg/dL — AB (ref 65–99)
POTASSIUM: 4.2 mmol/L (ref 3.5–5.1)
Sodium: 145 mmol/L (ref 135–145)

## 2015-06-02 MED ORDER — FUROSEMIDE 40 MG PO TABS: 40.0000 mg | ORAL_TABLET | Freq: Two times a day (BID) | ORAL | Status: DC

## 2015-06-02 NOTE — Progress Notes (Signed)
Patient Name: Briana [name] Date of Encounter: 06/02/2015  Principal Problem:   Acute on chronic diastolic CHF (congestive heart failure) ([place]) Active Problems:   Morbid obesity ([place])   Type 2 diabetes mellitus with stage 3 chronic kidney disease (HCC)   CKD (chronic kidney disease) stage 3, GFR 30-59 ml/min   Essential hypertension, benign   Hyperlipidemia   Primary Cardiologist: Dr Bronson Ing  Patient Profile: 70 yo female w/ hx CABG 2005, HTN, DM, HL, rheumatic fever age 20, cerebrovascular dz, mod AS and severe MAC w/ mild MR and PAS 56 by echo 10/2014, D-CHF. Admitted 01/04 w/ SOB, CHF  SUBJECTIVE: Hx of tachypalps ever since she had rheumatic fever. She feels them but they are brief and she never has other sx with them. Feels much better than admission, breathing well, no chest pain.   OBJECTIVE Filed Vitals:   06/01/15 1049 06/01/15 1833 06/01/15 2134 06/02/15 0516  BP:  165/61 163/63 160/52  Pulse: 67 63 60 55  Temp:  99 F (37.2 C) 98.6 F (37 C) 98.8 F (37.1 C)  TempSrc:  Oral Oral Oral  Resp:  20 20 20   Height:      Weight:    213 lb 8 oz (96.843 kg)  SpO2:  95% 96% 95%    Intake/Output Summary (Last 24 hours) at 06/02/15 1116 Last data filed at 06/02/15 0821  Gross per 24 hour  Intake    483 ml  Output   4050 ml  Net  -3567 ml   Filed Weights   05/31/15 1428 06/01/15 0530 06/02/15 0516  Weight: 230 lb (104.327 kg) 218 lb (98.884 kg) 213 lb 8 oz (96.843 kg)    PHYSICAL EXAM General: Well developed, well nourished, female in no acute distress. Head: Normocephalic, atraumatic.  Neck: Supple without bruits, JVD 8 cm, mild HJ reflux. Lungs:  Resp regular and unlabored, mildly decreased BS bases w/ few rales. Heart: RRR, S1, S2, no S3, S4, 2/6 murmur; no rub. Abdomen: Soft, non-tender, non-distended, BS + x 4.  Extremities: No clubbing, cyanosis, edema.  Neuro: Alert and oriented X 3. Moves all extremities spontaneously. Psych: Normal  affect.  LABS: CBC:  Recent Labs  05/31/15 1522  WBC 8.6  HGB 12.0  HCT 37.5  MCV 86.2  PLT 0000000   Basic Metabolic Panel:  Recent Labs  06/01/15 0520 06/02/15 0518  NA 143 145  K 3.8 4.2  CL 109 107  CO2 24 27  GLUCOSE 149* 117*  BUN 26* 42*  CREATININE 1.69* 1.99*  CALCIUM 8.9 8.4*   Cardiac Enzymes:  Recent Labs  05/31/15 1957  TROPONINI <0.03   BNP:  B NATRIURETIC PEPTIDE  Date/Time Value Ref Range Status  05/31/2015 03:22 PM 391.0* 0.0 - 100.0 pg/mL Final   Hemoglobin A1C:  Recent Labs  05/31/15 1332  HGBA1C 6.7*   Thyroid Function Tests:  Recent Labs  05/31/15 1515  TSH 2.987   TELE:  SR, 2 SVT episodes, neither lasted over 10 seconds, no pauses      Radiology/Studies: Dg Chest 2 View  05/31/2015  CLINICAL DATA:  Leg swelling.  Shortness of breath and weakness. EXAM: CHEST  2 VIEW COMPARISON:  03/03/2014 FINDINGS: Sequelae of prior CABG are again identified. The cardiac silhouette remains mildly enlarged. There is pulmonary vascular congestion with mild bilateral interstitial prominence which is slightly less than that on the prior study. No confluent airspace opacity, pleural effusion, or pneumothorax is identified. No acute osseous abnormality  is identified. IMPRESSION: Mild cardiomegaly, pulmonary vascular congestion, and likely mild interstitial edema. Electronically Signed   By: Logan Bores M.D.   On: 05/31/2015 15:15     Current Medications:  . aspirin EC  325 mg Oral Daily  . atorvastatin  80 mg Oral QPM  . cholecalciferol  1,000 Units Oral BID  . diazepam  5 mg Oral QHS  . enoxaparin (LOVENOX) injection  40 mg Subcutaneous Q24H  . furosemide  40 mg Oral BID  . insulin aspart  0-5 Units Subcutaneous QHS  . insulin aspart  0-9 Units Subcutaneous TID WC  . insulin aspart protamine- aspart  25 Units Subcutaneous BID WC  . losartan  100 mg Oral QPM  . metoprolol tartrate  25 mg Oral BID  . potassium chloride SA  20 mEq Oral Daily  .  sertraline  200 mg Oral Daily  . sodium chloride  3 mL Intravenous Q12H  . sodium chloride  3 mL Intravenous Q12H      ASSESSMENT AND PLAN: Principal Problem:   Acute on chronic diastolic CHF (congestive heart failure) (HCC) - volume status much improved, got IV Lasix this am, now on oral Lasix - continue this, needs daily weights, 2000 mg Na diet as OP - have arranged f/u appt w/ Dr Raliegh Ip  Otherwise, per IM, would follow renal function closely, through cards or primary MD Active Problems:   Morbid obesity ([place])   Type 2 diabetes mellitus with stage 3 chronic kidney disease (HCC)   CKD (chronic kidney disease) stage 3, GFR 30-59 ml/min   Essential hypertension, benign   Hyperlipidemia   Signed, Rosaria Ferries , PA-C 11:16 AM 06/02/2015 Pateient seen and examined  I agree with findings as noted by R Barrett  On exam, Lungs are clear  CardiacRRR  Ext without edema Tele with short bursts of SVT  No dizziness associated  She reports having these for years.   I would not change meds for this.  OK to d/c from cardiac standpoint.  Will make sure she has f/u.  Dorris Carnes

## 2015-06-02 NOTE — Care Management Note (Signed)
Case Management Note  Patient Details  Name: Briana [name] MRN: [id number] Date of Birth: [birth year]  Subjective/Objective:                    Action/Plan:   Expected Discharge Date:                  Expected Discharge Plan:  Home/Self Care  In-House Referral:  NA  Discharge planning Services  CM Consult, NA  Post Acute Care Choice:  NA Choice offered to:  NA  DME Arranged:    DME Agency:     HH Arranged:    HH Agency:     Status of Service:  Completed, signed off  Medicare Important Message Given:  Yes Date Medicare IM Given:    Medicare IM give by:    Date Additional Medicare IM Given:    Additional Medicare Important Message give by:     If discussed at [place] of Stay Meetings, dates discussed:    Additional Comments: Pt discharged home today. No CM needs noted. Christinia Gully Carlton, RN 06/02/2015, 12:08 PM

## 2015-06-02 NOTE — Progress Notes (Signed)
Patient alert and oriented, independent, VSS, pt. Tolerating diet well. No complaints of pain or nausea. Pt. Had IV removed tip intact. Pt. Had prescriptions given. Pt. Voiced understanding of discharge instructions with no further questions. Pt. Discharged via wheelchair with auxilliary.  

## 2015-06-02 NOTE — Discharge Summary (Signed)
Physician Discharge Summary  Briana [name] [id number] DOB: [birth year] DOA: 05/31/2015  PCP: Antionette Fairy, PA-C  Admit date: 05/31/2015 Discharge date: 06/02/2015  Time spent: 35 minutes  Recommendations for Outpatient Follow-up:  1. Needs OP weight checks and compliance checks with Lasix-doesn't take because of cramping which might have caused hospital stay 2. recommend PCP check kidney function 1 week 3. Further OP work-up of stable  tachyarrhythmia as OP-suspect limited options given bradycardia 4. Can keep OP follow up with Dr. Kellie Simmering for need for Carotid endarterectomy 5. Low salt diet stressed    Discharge Diagnoses:  Principal Problem:   Acute on chronic diastolic CHF (congestive heart failure) (HCC) Active Problems:   Morbid obesity ([place])   Type 2 diabetes mellitus with stage 3 chronic kidney disease (HCC)   CKD (chronic kidney disease) stage 3, GFR 30-59 ml/min   Essential hypertension, benign   Hyperlipidemia   Discharge Condition: fauir  Diet recommendation:  Low salt  Filed Weights   05/31/15 1428 06/01/15 0530 06/02/15 0516  Weight: 104.327 kg (230 lb) 98.884 kg (218 lb) 96.843 kg (213 lb 8 oz)    History of present illness:   70 y/o ? CAD + CABG 2005 Rheum fever baseline ckd 2-3 Htn DM HLD Echo 6/16 severe LVH + severe Pulm Htn Carotid disease s/p CEA R side-scheduled for L CEA 06/2015  Admitted 1/5 with fluid overload, HTN urgency and found to be non-compliant lasix 2/2 to cramping Diuresed with lasix iv 80 bid ~ 5 liters Had some AKI with IV lasix and switched to PO lasix 40 daily Noted some SVT-she also has Rheumatic heart disease in the past and has had SVT runs in the past which she recognizes She has been managed carefully by cardiology and there is not much room to increase Rate controlling agents Her K is above 4  I believe she can be d/c home with her usual lasix doses  She will need OP Cardiac follow up closely and compliacne on  home regimen  Consultations:  Cardiolog  Discharge Exam: Filed Vitals:   06/01/15 2134 06/02/15 0516  BP: 163/63 160/52  Pulse: 60 55  Temp: 98.6 F (37 C) 98.8 F (37.1 C)  Resp: 20 20    General:  Much improved Cardiovascular:  s1 s2 no m/r/g Respiratory: clear LE swelling signif improved  Discharge Instructions   Discharge Instructions    Diet - low sodium heart healthy    Complete by:  As directed      Discharge instructions    Complete by:  As directed   Take lasix as prescribed once daily 40 mg-Mustard might help you avoid the complications of lower extremity cramping  For the racing heart rate I would suggest no further work-up and a discussion with your regular doctor Please follow with Dr. Kellie Simmering as an OP non urgently Get some lab-work soon with regular MD as your kidney function was affected by your need for Lasix     Increase activity slowly    Complete by:  As directed           Current Discharge Medication List    CONTINUE these medications which have NOT CHANGED   Details  aspirin EC 325 MG tablet Take 325 mg by mouth daily.    atorvastatin (LIPITOR) 80 MG tablet Take 80 mg by mouth every evening.     cholecalciferol (VITAMIN D) 1000 UNITS tablet Take 1,000 Units by mouth 2 (two) times daily.  diazepam (VALIUM) 5 MG tablet Take 5 mg by mouth at bedtime. *Will take one tablet daily as needed for anxiety    furosemide (LASIX) 40 MG tablet Take 40 mg by mouth daily.     hydrALAZINE (APRESOLINE) 50 MG tablet Take 1.5 tablets (75 mg total) by mouth 3 (three) times daily. Qty: 270 tablet, Refills: 3    HYDROcodone-acetaminophen (NORCO) 7.5-325 MG tablet TAKE ONE TABLET BY MOUTH EVERYU 4 HOURS AS NEEDED FOR PAIN Refills: 0    insulin NPH-regular Human (NOVOLIN 70/30) (70-30) 100 UNIT/ML injection Inject 30 Units into the skin 2 (two) times daily before a meal. Qty: 2 vial, Refills: 2    JANUVIA 25 MG tablet Take 1 tablet (25 mg total) by mouth  daily. Qty: 30 tablet, Refills: 2    losartan (COZAAR) 100 MG tablet Take 100 mg by mouth every evening.     metoprolol tartrate (LOPRESSOR) 25 MG tablet Take 1 tablet (25 mg total) by mouth 2 (two) times daily. Qty: 180 tablet, Refills: 3    potassium chloride SA (K-DUR,KLOR-CON) 20 MEQ tablet Take 20 mEq by mouth daily.     sertraline (ZOLOFT) 100 MG tablet Take 200 mg by mouth daily.     tiZANidine (ZANAFLEX) 4 MG capsule Take 4 mg by mouth 3 (three) times daily as needed for muscle spasms (*Takes at bedtime*).       STOP taking these medications     amLODipine (NORVASC) 10 MG tablet        Allergies  Allergen Reactions  . Carbamazepine Other (See Comments)    Unknown reaction-patient is not aware of this allergy  . Clonazepam Other (See Comments)    Patient is not aware of this allergy  . Gemfibrozil Other (See Comments)    Patient is not aware of this allergy but states that she has side effects to a cholesterol medication in the past  . Macrobid [Nitrofurantoin] Nausea And Vomiting  . Oxycodone Other (See Comments)    hallucinations   Follow-up Information    Follow up with Herminio Commons, MD On 06/27/2015.   Specialty:  Cardiology   Why:  See MD at 9:30 am, please arrive 15 minutes early for paperwork.   Contact information:   [place] [address] [phone]        The results of significant diagnostics from this hospitalization (including imaging, microbiology, ancillary and laboratory) are listed below for reference.    Significant Diagnostic Studies: Dg Chest 2 View  05/31/2015  CLINICAL DATA:  Leg swelling.  Shortness of breath and weakness. EXAM: CHEST  2 VIEW COMPARISON:  03/03/2014 FINDINGS: Sequelae of prior CABG are again identified. The cardiac silhouette remains mildly enlarged. There is pulmonary vascular congestion with mild bilateral interstitial prominence which is slightly less than that on the prior study. No confluent  airspace opacity, pleural effusion, or pneumothorax is identified. No acute osseous abnormality is identified. IMPRESSION: Mild cardiomegaly, pulmonary vascular congestion, and likely mild interstitial edema. Electronically Signed   By: Logan Bores M.D.   On: 05/31/2015 15:15    Microbiology: No results found for this or any previous visit (from the past 240 hour(s)).   Labs: Basic Metabolic Panel:  Recent Labs Lab 05/31/15 1332 05/31/15 1522 06/01/15 0520 06/02/15 0518  NA 142 144 143 145  K 4.7 4.6 3.8 4.2  CL 110 112* 109 107  CO2 21 22 24 27   GLUCOSE 106* 129* 149* 117*  BUN 25 28* 26*  42*  CREATININE 1.53* 1.64* 1.69* 1.99*  CALCIUM 8.7 8.9 8.9 8.4*   Liver Function Tests: No results for input(s): AST, ALT, ALKPHOS, BILITOT, PROT, ALBUMIN in the last 168 hours. No results for input(s): LIPASE, AMYLASE in the last 168 hours. No results for input(s): AMMONIA in the last 168 hours. CBC:  Recent Labs Lab 05/31/15 1522  WBC 8.6  HGB 12.0  HCT 37.5  MCV 86.2  PLT 261   Cardiac Enzymes:  Recent Labs Lab 05/31/15 1957  TROPONINI <0.03   BNP: BNP (last 3 results)  Recent Labs  05/31/15 1522  BNP 391.0*    ProBNP (last 3 results) No results for input(s): PROBNP in the last 8760 hours.  CBG:  Recent Labs Lab 06/01/15 0811 06/01/15 1112 06/01/15 1832 06/01/15 2051 06/02/15 0724  GLUCAP 139* 184* 184* 160* 106*       Signed:  Nita Sells MD  FACP  [facility] 06/02/2015, 11:30 AM

## 2015-06-02 NOTE — Care Management Important Message (Signed)
Important Message  Patient Details  Name: Briana [name] MRN: [id number] Date of Birth: 1945/07/15   Medicare Important Message Given:  Yes    Joylene Draft, RN 06/02/2015, 8:12 AM

## 2015-06-27 ENCOUNTER — Encounter: Payer: Self-pay | Admitting: Cardiovascular Disease

## 2015-06-27 ENCOUNTER — Ambulatory Visit (INDEPENDENT_AMBULATORY_CARE_PROVIDER_SITE_OTHER): Payer: Medicare Other | Admitting: Cardiovascular Disease

## 2015-06-27 VITALS — BP 160/62 | HR 55 | Ht 65.0 in | Wt 224.0 lb

## 2015-06-27 DIAGNOSIS — I779 Disorder of arteries and arterioles, unspecified: Secondary | ICD-10-CM | POA: Diagnosis not present

## 2015-06-27 DIAGNOSIS — E785 Hyperlipidemia, unspecified: Secondary | ICD-10-CM

## 2015-06-27 DIAGNOSIS — I25708 Atherosclerosis of coronary artery bypass graft(s), unspecified, with other forms of angina pectoris: Secondary | ICD-10-CM

## 2015-06-27 DIAGNOSIS — I5032 Chronic diastolic (congestive) heart failure: Secondary | ICD-10-CM

## 2015-06-27 DIAGNOSIS — Z87898 Personal history of other specified conditions: Secondary | ICD-10-CM

## 2015-06-27 DIAGNOSIS — I10 Essential (primary) hypertension: Secondary | ICD-10-CM | POA: Diagnosis not present

## 2015-06-27 DIAGNOSIS — Z9289 Personal history of other medical treatment: Secondary | ICD-10-CM

## 2015-06-27 DIAGNOSIS — I739 Peripheral vascular disease, unspecified: Secondary | ICD-10-CM

## 2015-06-27 MED ORDER — HYDRALAZINE HCL 100 MG PO TABS: 100.0000 mg | ORAL_TABLET | Freq: Three times a day (TID) | ORAL | Status: DC

## 2015-06-27 NOTE — Patient Instructions (Signed)
Medication Instructions:  INCREASE HYDRALAZINE 100 mg three times daily     Labwork: none  Testing/Procedures: none  Follow-Up: Your physician recommends that you schedule a follow-up appointment in: 3 months with DR. Koneswaran    Any Other Special Instructions Will Be Listed Below (If Applicable).     If you need a refill on your cardiac medications before your next appointment, please call your pharmacy. \

## 2015-06-27 NOTE — Progress Notes (Signed)
Patient ID: [name], female   DOB: 05-21-1946, 70 y.o.   MRN: [id number]      SUBJECTIVE: The patient presents for post-hospitalization follow up for acute diastolic heart failure. It appears she may have been noncompliant with Lasix, not taking it due to cramping. Had accelerated hypertension, renal artery Dopplers recommended. She underwent CABG on 05/23/2004 with LIMA to LAD, SVG to OM 2, and SVG to distal RCA.  Echocardiogram on 11/24/14 showed vigorous left ventricular systolic function, EF [id number], severe LVH, grade 2 diastolic dysfunction, elevated filling pressures, moderate left atrial enlargement, severe mitral annular calcification with mild mitral regurgitation, moderate aortic valve sclerosis with no stenosis, mild aortic regurgitation, mild right atrial enlargement, and moderate to severe pulmonary hypertension with PASP 56 mmHg.  Carotid Dopplers on 11/24/14 demonstrated proximal left internal carotid artery stenosis greater than 70% and less than 50% right internal carotid artery stenosis.  She is doing well and denies chest pain, shortness of breath, and leg swelling.   Review of Systems: As per "subjective", otherwise negative.  Allergies  Allergen Reactions  . Carbamazepine Other (See Comments)    Unknown reaction-patient is not aware of this allergy  . Clonazepam Other (See Comments)    Patient is not aware of this allergy  . Gemfibrozil Other (See Comments)    Patient is not aware of this allergy but states that she has side effects to a cholesterol medication in the past  . Macrobid [Nitrofurantoin] Nausea And Vomiting  . Oxycodone Other (See Comments)    hallucinations    Current Outpatient Prescriptions  Medication Sig Dispense Refill  . aspirin EC 325 MG tablet Take 325 mg by mouth daily.    Marland Kitchen atorvastatin (LIPITOR) 80 MG tablet Take 80 mg by mouth every evening.     . cholecalciferol (VITAMIN D) 1000 UNITS tablet Take 1,000 Units by mouth 2 (two)  times daily.     . diazepam (VALIUM) 5 MG tablet Take 5 mg by mouth at bedtime. *Will take one tablet daily as needed for anxiety    . furosemide (LASIX) 40 MG tablet Take 40 mg by mouth daily.     . hydrALAZINE (APRESOLINE) 50 MG tablet Take 1.5 tablets (75 mg total) by mouth 3 (three) times daily. 270 tablet 3  . HYDROcodone-acetaminophen (NORCO) 7.5-325 MG tablet TAKE ONE TABLET BY MOUTH EVERYU 4 HOURS AS NEEDED FOR PAIN  0  . insulin NPH-regular Human (NOVOLIN 70/30) (70-30) 100 UNIT/ML injection Inject 30 Units into the skin 2 (two) times daily before a meal. (Patient taking differently: Inject 25 Units into the skin 2 (two) times daily before a meal. ) 2 vial 2  . JANUVIA 25 MG tablet Take 1 tablet (25 mg total) by mouth daily. 30 tablet 2  . losartan (COZAAR) 100 MG tablet Take 100 mg by mouth every evening.     . metoprolol tartrate (LOPRESSOR) 25 MG tablet Take 1 tablet (25 mg total) by mouth 2 (two) times daily. 180 tablet 3  . potassium chloride SA (K-DUR,KLOR-CON) 20 MEQ tablet Take 20 mEq by mouth daily.     . sertraline (ZOLOFT) 100 MG tablet Take 200 mg by mouth daily.     Marland Kitchen tiZANidine (ZANAFLEX) 4 MG capsule Take 4 mg by mouth 3 (three) times daily as needed for muscle spasms (*Takes at bedtime*).      No current facility-administered medications for this visit.    Past Medical History  Diagnosis Date  . Diabetes mellitus   .  Hypertension   . Hyperlipidemia   . Heart murmur   . Peripheral vascular disease ([place])   . Arthritis   . Depression with anxiety   . Carotid artery occlusion   . Chronic kidney disease   . Neuropathy ([place])   . CAD (coronary artery disease)   . Rheumatic fever   . Sleep apnea   . PAD (peripheral artery disease) ([place])   . Adenomatous colon polyp 09/2007    Past Surgical History  Procedure Laterality Date  . Coronary artery bypass graft  2005  . Carotid endarterectomy  10/04/2009    Right CEA  . Tubal ligation    . Polypectomy  02/25/2012     Procedure: POLYPECTOMY;  Surgeon: Danie Binder, MD;  Location: [facility];  Service: Endoscopy;  Laterality: N/A;    Social History   Social History  . Marital Status: Married    Spouse Name: N/A  . Number of Children: N/A  . Years of Education: N/A   Occupational History  . Not on file.   Social History Main Topics  . Smoking status: Never Smoker   . Smokeless tobacco: Never Used  . Alcohol Use: No  . Drug Use: No  . Sexual Activity: Not on file   Other Topics Concern  . Not on file   Social History Narrative     Filed Vitals:   06/27/15 0854  BP: 160/62  Pulse: 55  Height: 5\' 5"  (1.651 m)  Weight: 224 lb (101.606 kg)  SpO2: 96%    PHYSICAL EXAM General: NAD HEENT: Normal. Neck: No JVD, no thyromegaly. Lungs: Clear to auscultation bilaterally with normal respiratory effort. CV: Nondisplaced PMI. Regular rate and rhythm, normal S1/S2, no [id number], 2/6 systolic murmur loudest along left sternal border. No pretibial nor periankle edema. Bilateral carotid bruits.  Abdomen: Soft, nontender, obese.  Neurologic: Alert and oriented x 3.  Psych: Normal affect. Skin: Normal. Musculoskeletal: No gross deformities. Extremities: No clubbing or cyanosis.   ECG: Most recent ECG reviewed.      ASSESSMENT AND PLAN: 1. CAD with h/o 3-v CABG in 04/2004: CCS class I-II exertional angina. Continue ASA, metoprolol, and statin therapy. Would consider stress testing in future if symptoms recur.  2. Essential HTN: Elevated. Increase hydralazine to 100 mg tid. Already on maximum dose of losartan and no longer on amlodipine. Will consider renal artery Dopplers.  3. Hyperlipidemia: On Lipitor 80 mg.  4. Left internal carotid artery stenosis and s/p right CEA: Will repeat Dopplers in June 2017. Continue ASA and statin.  5. Chronic diastolic heart failure: Increase hydralazine as noted above to control BP.  Dispo: f/u 3 months.   Kate Sable, M.D., F.A.C.C.

## 2015-06-29 ENCOUNTER — Ambulatory Visit (INDEPENDENT_AMBULATORY_CARE_PROVIDER_SITE_OTHER): Payer: Medicare Other | Admitting: "Endocrinology

## 2015-06-29 ENCOUNTER — Encounter: Payer: Self-pay | Admitting: "Endocrinology

## 2015-06-29 VITALS — BP 141/58 | HR 58 | Ht 65.0 in | Wt 227.0 lb

## 2015-06-29 DIAGNOSIS — E1122 Type 2 diabetes mellitus with diabetic chronic kidney disease: Secondary | ICD-10-CM

## 2015-06-29 DIAGNOSIS — Z794 Long term (current) use of insulin: Secondary | ICD-10-CM | POA: Diagnosis not present

## 2015-06-29 DIAGNOSIS — E785 Hyperlipidemia, unspecified: Secondary | ICD-10-CM | POA: Diagnosis not present

## 2015-06-29 DIAGNOSIS — I10 Essential (primary) hypertension: Secondary | ICD-10-CM | POA: Diagnosis not present

## 2015-06-29 DIAGNOSIS — N183 Chronic kidney disease, stage 3 unspecified: Secondary | ICD-10-CM

## 2015-06-29 MED ORDER — JANUVIA 25 MG PO TABS: 25.0000 mg | ORAL_TABLET | Freq: Every day | ORAL | Status: DC

## 2015-06-29 MED ORDER — INSULIN NPH ISOPHANE & REGULAR (70-30) 100 UNIT/ML ~~LOC~~ SUSP: 30.0000 [IU] | Freq: Two times a day (BID) | SUBCUTANEOUS | Status: DC

## 2015-06-29 NOTE — Progress Notes (Signed)
Subjective:    Patient ID: Briana [name], female    DOB: 1945/07/22,    Past Medical History  Diagnosis Date  . Diabetes mellitus   . Hypertension   . Hyperlipidemia   . Heart murmur   . Peripheral vascular disease ([place])   . Arthritis   . Depression with anxiety   . Carotid artery occlusion   . Chronic kidney disease   . Neuropathy ([place])   . CAD (coronary artery disease)   . Rheumatic fever   . Sleep apnea   . PAD (peripheral artery disease) ([place])   . Adenomatous colon polyp 09/2007   Past Surgical History  Procedure Laterality Date  . Coronary artery bypass graft  2005  . Carotid endarterectomy  10/04/2009    Right CEA  . Tubal ligation    . Polypectomy  02/25/2012    Procedure: POLYPECTOMY;  Surgeon: Danie Binder, MD;  Location: [facility];  Service: Endoscopy;  Laterality: N/A;   Social History   Social History  . Marital Status: Married    Spouse Name: N/A  . Number of Children: N/A  . Years of Education: N/A   Social History Main Topics  . Smoking status: Never Smoker   . Smokeless tobacco: Never Used  . Alcohol Use: No  . Drug Use: No  . Sexual Activity: Not Asked   Other Topics Concern  . None   Social History Narrative   Outpatient Encounter Prescriptions as of 06/29/2015  Medication Sig  . insulin NPH-regular Human (NOVOLIN 70/30) (70-30) 100 UNIT/ML injection Inject 30 Units into the skin 2 (two) times daily with a meal.  . JANUVIA 25 MG tablet Take 1 tablet (25 mg total) by mouth daily.  . [DISCONTINUED] insulin NPH-regular Human (NOVOLIN 70/30) (70-30) 100 UNIT/ML injection Inject 25 Units into the skin 2 (two) times daily with a meal.  . [DISCONTINUED] JANUVIA 25 MG tablet Take 1 tablet (25 mg total) by mouth daily.  Marland Kitchen aspirin EC 325 MG tablet Take 325 mg by mouth daily.  Marland Kitchen atorvastatin (LIPITOR) 80 MG tablet Take 80 mg by mouth every evening.   . cholecalciferol (VITAMIN D) 1000 UNITS tablet Take 1,000 Units by mouth 2 (two) times daily.    . diazepam (VALIUM) 5 MG tablet Take 5 mg by mouth at bedtime. *Will take one tablet daily as needed for anxiety  . furosemide (LASIX) 40 MG tablet Take 40 mg by mouth daily.   . hydrALAZINE (APRESOLINE) 100 MG tablet Take 1 tablet (100 mg total) by mouth 3 (three) times daily.  Marland Kitchen HYDROcodone-acetaminophen (NORCO) 7.5-325 MG tablet TAKE ONE TABLET BY MOUTH EVERYU 4 HOURS AS NEEDED FOR PAIN  . losartan (COZAAR) 100 MG tablet Take 100 mg by mouth every evening.   . metoprolol tartrate (LOPRESSOR) 25 MG tablet Take 1 tablet (25 mg total) by mouth 2 (two) times daily.  . potassium chloride SA (K-DUR,KLOR-CON) 20 MEQ tablet Take 20 mEq by mouth daily.   . sertraline (ZOLOFT) 100 MG tablet Take 200 mg by mouth daily.   Marland Kitchen tiZANidine (ZANAFLEX) 4 MG capsule Take 4 mg by mouth 3 (three) times daily as needed for muscle spasms (*Takes at bedtime*).   . [DISCONTINUED] insulin NPH-regular Human (NOVOLIN 70/30) (70-30) 100 UNIT/ML injection Inject 30 Units into the skin 2 (two) times daily before a meal. (Patient taking differently: Inject 25 Units into the skin 2 (two) times daily before a meal. )   No facility-administered encounter medications on  file as of 06/29/2015.   ALLERGIES: Allergies  Allergen Reactions  . Carbamazepine Other (See Comments)    Unknown reaction-patient is not aware of this allergy  . Clonazepam Other (See Comments)    Patient is not aware of this allergy  . Gemfibrozil Other (See Comments)    Patient is not aware of this allergy but states that she has side effects to a cholesterol medication in the past  . Macrobid [Nitrofurantoin] Nausea And Vomiting  . Oxycodone Other (See Comments)    hallucinations   VACCINATION STATUS: Immunization History  Administered Date(s) Administered  . Influenza,inj,Quad PF,36+ Mos 03/03/2014    Diabetes She presents for her follow-up diabetic visit. She has type 2 diabetes mellitus. Onset time: She was diagnosed at approximate age of 55  years. Her disease course has been improving. There are no hypoglycemic associated symptoms. Pertinent negatives for hypoglycemia include no confusion, headaches, pallor or seizures. There are no diabetic associated symptoms. Pertinent negatives for diabetes include no chest pain, no fatigue, no polydipsia, no polyphagia and no polyuria. There are no hypoglycemic complications. Symptoms are improving. Diabetic complications include nephropathy. Risk factors for coronary artery disease include dyslipidemia, diabetes mellitus, hypertension, obesity and sedentary lifestyle. Current diabetic treatment includes insulin injections. She is compliant with treatment most of the time. Her weight is stable. She is following a generally unhealthy diet. She has not had a previous visit with a dietitian (She declined dietitian visit.). She rarely participates in exercise. Home blood sugar record trend: Unfortunately she did not bring her meter nor log this time. An ACE inhibitor/angiotensin II receptor blocker is being taken. Eye exam is current.  Hyperlipidemia This is a chronic problem. The current episode started more than 1 year ago. Exacerbating diseases include diabetes and obesity. Pertinent negatives include no chest pain, myalgias or shortness of breath. Current antihyperlipidemic treatment includes statins. Risk factors for coronary artery disease include diabetes mellitus, dyslipidemia, hypertension, obesity and a sedentary lifestyle.  Hypertension This is a chronic problem. The current episode started more than 1 year ago. The problem is uncontrolled. Pertinent negatives include no chest pain, headaches, palpitations or shortness of breath. Past treatments include ACE inhibitors and calcium channel blockers. Hypertensive end-organ damage includes kidney disease.     Review of Systems  Constitutional: Negative for fatigue and unexpected weight change.  HENT: Negative for trouble swallowing and voice change.    Eyes: Negative for visual disturbance.  Respiratory: Negative for cough, shortness of breath and wheezing.   Cardiovascular: Negative for chest pain, palpitations and leg swelling.  Gastrointestinal: Negative for nausea, vomiting and diarrhea.  Endocrine: Negative for cold intolerance, heat intolerance, polydipsia, polyphagia and polyuria.  Musculoskeletal: Negative for myalgias and arthralgias.  Skin: Negative for color change, pallor, rash and wound.  Neurological: Negative for seizures and headaches.  Psychiatric/Behavioral: Negative for suicidal ideas and confusion.    Objective:    BP 141/58 mmHg  Pulse 58  Ht 5\' 5"  (1.651 m)  Wt 227 lb (102.967 kg)  BMI 37.77 kg/m2  SpO2 95%  Wt Readings from Last 3 Encounters:  06/29/15 227 lb (102.967 kg)  06/27/15 224 lb (101.606 kg)  06/02/15 213 lb 8 oz (96.843 kg)    Physical Exam  Constitutional: She is oriented to person, place, and time. She appears well-developed.  HENT:  Head: Normocephalic and atraumatic.  Eyes: EOM are normal.  Neck: Normal range of motion. Neck supple. No tracheal deviation present. No thyromegaly present.  Cardiovascular: Normal rate and regular  rhythm.   Pulmonary/Chest: Effort normal and breath sounds normal.  Abdominal: Soft. Bowel sounds are normal. There is no tenderness. There is no guarding.  Musculoskeletal: Normal range of motion. She exhibits no edema.  Neurological: She is alert and oriented to person, place, and time. She has normal reflexes. No cranial nerve deficit. Coordination normal.  Skin: Skin is warm and dry. No rash noted. No erythema. No pallor.  Psychiatric: She has a normal mood and affect. Judgment normal.    Results for orders placed or performed during the hospital encounter of 05/31/15  Brain natriuretic peptide  Result Value Ref Range   B Natriuretic Peptide 391.0 (H) 0.0 - 100.0 pg/mL  Basic metabolic panel  Result Value Ref Range   Sodium 144 135 - 145 mmol/L    Potassium 4.6 3.5 - 5.1 mmol/L   Chloride 112 (H) 101 - 111 mmol/L   CO2 22 22 - 32 mmol/L   Glucose, Bld 129 (H) 65 - 99 mg/dL   BUN 28 (H) 6 - 20 mg/dL   Creatinine, Ser 1.64 (H) 0.44 - 1.00 mg/dL   Calcium 8.9 8.9 - 10.3 mg/dL   GFR calc non Af Amer 31 (L) >60 mL/min   GFR calc Af Amer 36 (L) >60 mL/min   Anion gap 10 5 - 15  CBC  Result Value Ref Range   WBC 8.6 4.0 - 10.5 K/uL   RBC 4.35 3.87 - 5.11 MIL/uL   Hemoglobin 12.0 12.0 - 15.0 g/dL   HCT 37.5 36.0 - 46.0 %   MCV 86.2 78.0 - 100.0 fL   MCH 27.6 26.0 - 34.0 pg   MCHC 32.0 30.0 - 36.0 g/dL   RDW 14.5 11.5 - 15.5 %   Platelets 261 150 - 400 K/uL  Urinalysis, Routine w reflex microscopic (not at [facility])  Result Value Ref Range   Color, Urine YELLOW YELLOW   APPearance CLEAR CLEAR   Specific Gravity, Urine 1.020 1.005 - 1.030   pH 5.5 5.0 - 8.0   Glucose, UA NEGATIVE NEGATIVE mg/dL   Hgb urine dipstick TRACE (A) NEGATIVE   Bilirubin Urine NEGATIVE NEGATIVE   Ketones, ur NEGATIVE NEGATIVE mg/dL   Protein, ur 100 (A) NEGATIVE mg/dL   Nitrite NEGATIVE NEGATIVE   Leukocytes, UA NEGATIVE NEGATIVE  Urine microscopic-add on  Result Value Ref Range   Squamous Epithelial / LPF 0-5 (A) NONE SEEN   WBC, UA 6-30 0 - 5 WBC/hpf   RBC / HPF 0-5 0 - 5 RBC/hpf   Bacteria, UA FEW (A) NONE SEEN  Troponin I  Result Value Ref Range   Troponin I <0.03 <0.031 ng/mL  TSH  Result Value Ref Range   TSH 2.987 0.350 - 4.500 uIU/mL  Basic metabolic panel  Result Value Ref Range   Sodium 143 135 - 145 mmol/L   Potassium 3.8 3.5 - 5.1 mmol/L   Chloride 109 101 - 111 mmol/L   CO2 24 22 - 32 mmol/L   Glucose, Bld 149 (H) 65 - 99 mg/dL   BUN 26 (H) 6 - 20 mg/dL   Creatinine, Ser 1.69 (H) 0.44 - 1.00 mg/dL   Calcium 8.9 8.9 - 10.3 mg/dL   GFR calc non Af Amer 30 (L) >60 mL/min   GFR calc Af Amer 34 (L) >60 mL/min   Anion gap 10 5 - 15  Glucose, capillary  Result Value Ref Range   Glucose-Capillary 139 (H) 65 - 99 mg/dL  Glucose,  capillary  Result Value  Ref Range   Glucose-Capillary 184 (H) 65 - 99 mg/dL  Basic metabolic panel  Result Value Ref Range   Sodium 145 135 - 145 mmol/L   Potassium 4.2 3.5 - 5.1 mmol/L   Chloride 107 101 - 111 mmol/L   CO2 27 22 - 32 mmol/L   Glucose, Bld 117 (H) 65 - 99 mg/dL   BUN 42 (H) 6 - 20 mg/dL   Creatinine, Ser 1.99 (H) 0.44 - 1.00 mg/dL   Calcium 8.4 (L) 8.9 - 10.3 mg/dL   GFR calc non Af Amer 24 (L) >60 mL/min   GFR calc Af Amer 28 (L) >60 mL/min   Anion gap 11 5 - 15  Glucose, capillary  Result Value Ref Range   Glucose-Capillary 184 (H) 65 - 99 mg/dL   Comment 1 Document in Chart   Glucose, capillary  Result Value Ref Range   Glucose-Capillary 160 (H) 65 - 99 mg/dL   Comment 1 Notify RN    Comment 2 Document in Chart   Glucose, capillary  Result Value Ref Range   Glucose-Capillary 106 (H) 65 - 99 mg/dL   Comment 1 Notify RN    Comment 2 Document in Chart   Glucose, capillary  Result Value Ref Range   Glucose-Capillary 123 (H) 65 - 99 mg/dL   Comment 1 Notify RN    Comment 2 Document in Chart   CBG monitoring, ED  Result Value Ref Range   Glucose-Capillary 145 (H) 65 - 99 mg/dL  CBG monitoring, ED  Result Value Ref Range   Glucose-Capillary 156 (H) 65 - 99 mg/dL   Diabetic Labs (most recent): Lab Results  Component Value Date   HGBA1C 6.7* 05/31/2015   HGBA1C 6.4* 03/20/2015   HGBA1C 6.8* 12/12/2014    Lipid Panel     Component Value Date/Time   CHOL  04/30/2009 0323    142        ATP III CLASSIFICATION:  <200     mg/dL   Desirable  200-239  mg/dL   Borderline High  >=240    mg/dL   High          TRIG 151* 04/30/2009 0323   HDL 41 04/30/2009 0323   CHOLHDL 3.5 04/30/2009 0323   VLDL 30 04/30/2009 0323   LDLCALC  04/30/2009 0323    71        Total Cholesterol/HDL:CHD Risk Coronary Heart Disease Risk Table                     Men   Women  1/2 Average Risk   3.4   3.3  Average Risk       5.0   4.4  2 X Average Risk   9.6   7.1  3 X  Average Risk  23.4   11.0        Use the calculated Patient Ratio above and the CHD Risk Table to determine the patient's CHD Risk.        ATP III CLASSIFICATION (LDL):  <100     mg/dL   Optimal  100-129  mg/dL   Near or Above                    Optimal  130-159  mg/dL   Borderline  160-189  mg/dL   High  >190     mg/dL   Very High     Assessment & Plan:   1. Type 2 diabetes mellitus  with stage 3 chronic kidney disease, with long-term current use of insulin (HCC)  -Her diabetes is  complicated by chronic kidney disease and patient remains at a high risk for more acute and chronic complications of diabetes which include CAD, CVA, CKD, retinopathy, and neuropathy. These are all discussed in detail with the patient.  Patient came with improved glucose profile, and  recent A1c of 6.7 %, generally improved from 9.5%.  Glucose logs and insulin administration records pertaining to this visit,  to be scanned into patient's records.  Recent labs reviewed.   - I have re-counseled the patient on diet management and weight loss  by adopting a carbohydrate restricted / protein rich  Diet.  - Suggestion is made for patient to avoid simple carbohydrates   from their diet including Cakes , Desserts, Ice Cream,  Soda (  diet and regular) , Sweet Tea , Candies,  Chips, Cookies, Artificial Sweeteners,   and "Sugar-free" Products .  This will help patient to have stable blood glucose profile and potentially avoid unintended  Weight gain.  - Patient is advised to stick to a routine mealtimes to eat 3 meals  a day and avoid unnecessary snacks ( to snack only to correct hypoglycemia).  - The patient has declined a visit with CDE for individualized DM education.  - I have approached patient with the following individualized plan to manage diabetes and patient agrees.  - I will continue  Novolin 70/30 30 units with Breakfast and supper when glucose is above 90, associated with strict monitoring of glucose  AC and HS.  -Patient is warned not to take insulin without proper monitoring per orders.  -Patient is encouraged to call clinic for blood glucose levels less than 70 or above 300 mg /dl. - She declined referral to CDE. - I will continue Januvia 25 mg po qday. -Patient is not a candidate for MTF, SGLT2 i due to CKD.   - Patient specific target  for A1c; LDL, HDL, Triglycerides, and  Waist Circumference were discussed in detail.  2) BP/HTN: uncontrolled,advised her to Continue current medications including ACEI/ARB. 3) Lipids/HPL:  continue statins. 4)  Weight/Diet: declined CDE consult, exercise, and carbohydrates information provided.  5. Vitamin D deficiency -I advised her to continue with vitamin D 2000 units daily. 6) Chronic Care/Health Maintenance:  -Patient is on ACEI/ARB and Statin medications and encouraged to continue to follow up with Ophthalmology, Podiatrist at least yearly or according to recommendations, and advised to  stay away from smoking. I have recommended yearly flu vaccine and pneumonia vaccination at least every 5 years; moderate intensity exercise for up to 150 minutes weekly; and  sleep for at least 7 hours a day.  I advised patient to maintain close follow up with their PCP for primary care needs.  Patient is asked to bring meter and  blood glucose logs during their next visit.   Follow up plan: Return in about 3 months (around 09/26/2015) for diabetes, high blood pressure, high cholesterol, follow up with pre-visit labs, meter, and logs.  Glade Lloyd, MD Phone: [phone]  Fax: [phone]   06/29/2015, 2:47 PM

## 2015-06-29 NOTE — Patient Instructions (Signed)

## 2015-07-31 ENCOUNTER — Ambulatory Visit: Payer: Medicare Other | Admitting: Cardiovascular Disease

## 2015-08-10 ENCOUNTER — Ambulatory Visit: Payer: Medicare Other | Admitting: Orthopaedic Surgery

## 2015-08-17 ENCOUNTER — Ambulatory Visit (INDEPENDENT_AMBULATORY_CARE_PROVIDER_SITE_OTHER): Payer: Medicare Other | Admitting: Orthopaedic Surgery

## 2015-08-17 VITALS — BP 196/69 | HR 54 | Temp 97.7°F | Ht 65.0 in | Wt 222.8 lb

## 2015-08-17 DIAGNOSIS — E1042 Type 1 diabetes mellitus with diabetic polyneuropathy: Secondary | ICD-10-CM

## 2015-08-17 DIAGNOSIS — M25572 Pain in left ankle and joints of left foot: Secondary | ICD-10-CM

## 2015-08-17 MED ORDER — HYDROCODONE-ACETAMINOPHEN 7.5-325 MG PO TABS: 1.0000 | ORAL_TABLET | ORAL | Status: DC | PRN

## 2015-08-18 NOTE — Progress Notes (Signed)
Patient [id number] Briana [name], female DOB:[birth year], 70 y.o. [id number]  Chief Complaint  Patient presents with  . Follow-up    knee and ankle pain    HPI  Briana [name] is a 71 y.o. female who has chronic left knee and left lower leg and ankle pain.  She has more pain at night with cramps of the left foot and pain of the left foot after she goes to bed.  She has no trauma, no redness.  She has diabetes mellitus well controlled but has significant diabetic peripheral neuropathy.   Foot Pain The current episode started more than 1 year ago. The problem occurs daily. The problem has been gradually worsening. Associated symptoms include arthralgias, joint swelling and myalgias. Pertinent negatives include no chest pain, congestion or coughing. The symptoms are aggravated by exertion, stress and standing. She has tried acetaminophen, heat, ice, immobilization, lying down, NSAIDs, oral narcotics, position changes, relaxation, rest and sleep for the symptoms. The treatment provided mild relief.    Body mass index is 37.08 kg/(m^2).  Review of Systems  Patient has Diabetes Mellitus. Patient has hypertension. Patient does not have COPD or shortness of breath. Patient has BMI > 35. Patient does not have current smoking history.  Review of Systems  HENT: Negative for congestion.   Respiratory: Negative for cough and shortness of breath.   Cardiovascular: Negative for chest pain.  Endocrine: Positive for cold intolerance.  Musculoskeletal: Positive for myalgias, joint swelling, arthralgias and gait problem.    Past Medical History  Diagnosis Date  . Diabetes mellitus   . Hypertension   . Hyperlipidemia   . Heart murmur   . Peripheral vascular disease ([place])   . Arthritis   . Depression with anxiety   . Carotid artery occlusion   . Chronic kidney disease   . Neuropathy ([place])   . CAD (coronary artery disease)   . Rheumatic fever   . Sleep apnea   . PAD (peripheral artery  disease) ([place])   . Adenomatous colon polyp 09/2007    Past Surgical History  Procedure Laterality Date  . Coronary artery bypass graft  2005  . Carotid endarterectomy  10/04/2009    Right CEA  . Tubal ligation    . Polypectomy  02/25/2012    Procedure: POLYPECTOMY;  Surgeon: Danie Binder, MD;  Location: [facility];  Service: Endoscopy;  Laterality: N/A;    Family History  Problem Relation Age of Onset  . Breast cancer Mother   . Heart disease Father   . Diabetes Father   . Heart disease Brother     Social History Social History  Substance Use Topics  . Smoking status: Never Smoker   . Smokeless tobacco: Never Used  . Alcohol Use: No    Allergies  Allergen Reactions  . Carbamazepine Other (See Comments)    Unknown reaction-patient is not aware of this allergy  . Clonazepam Other (See Comments)    Patient is not aware of this allergy  . Gemfibrozil Other (See Comments)    Patient is not aware of this allergy but states that she has side effects to a cholesterol medication in the past  . Macrobid [Nitrofurantoin] Nausea And Vomiting  . Oxycodone Other (See Comments)    hallucinations    Current Outpatient Prescriptions  Medication Sig Dispense Refill  . aspirin EC 325 MG tablet Take 325 mg by mouth daily.    Marland Kitchen atorvastatin (LIPITOR) 80 MG tablet Take 80 mg by mouth every evening.     Marland Kitchen  cholecalciferol (VITAMIN D) 1000 UNITS tablet Take 1,000 Units by mouth 2 (two) times daily.     . diazepam (VALIUM) 5 MG tablet Take 5 mg by mouth at bedtime. *Will take one tablet daily as needed for anxiety    . furosemide (LASIX) 40 MG tablet Take 40 mg by mouth daily.     . hydrALAZINE (APRESOLINE) 100 MG tablet Take 1 tablet (100 mg total) by mouth 3 (three) times daily. 90 tablet 3  . HYDROcodone-acetaminophen (NORCO) 7.5-325 MG tablet Take 1 tablet by mouth every 4 (four) hours as needed for moderate pain (Must last 30 days.  Do not drive or operate machinery while taking this  medicine.). 120 tablet 0  . insulin NPH-regular Human (NOVOLIN 70/30) (70-30) 100 UNIT/ML injection Inject 30 Units into the skin 2 (two) times daily with a meal. 10 mL 2  . JANUVIA 25 MG tablet Take 1 tablet (25 mg total) by mouth daily. 30 tablet 2  . losartan (COZAAR) 100 MG tablet Take 100 mg by mouth every evening.     . metoprolol tartrate (LOPRESSOR) 25 MG tablet Take 1 tablet (25 mg total) by mouth 2 (two) times daily. 180 tablet 3  . potassium chloride SA (K-DUR,KLOR-CON) 20 MEQ tablet Take 20 mEq by mouth daily.     . sertraline (ZOLOFT) 100 MG tablet Take 200 mg by mouth daily.     Marland Kitchen tiZANidine (ZANAFLEX) 4 MG capsule Take 4 mg by mouth 3 (three) times daily as needed for muscle spasms (*Takes at bedtime*).      No current facility-administered medications for this visit.     Physical Exam  Blood pressure 196/69, pulse 54, temperature 97.7 F (36.5 C), height 5\' 5"  (1.651 m), weight 222 lb 12.8 oz (101.061 kg).  Constitutional: overall normal hygiene, normal nutrition, well developed, normal grooming, normal body habitus. Assistive device:none  Musculoskeletal: gait and station Limp left, muscle tone and strength are normal, no tremors or atrophy is present.  .  Neurological: coordination overall normal.  Deep tendon reflex/nerve stretch intact.  Sensation normal.  Cranial nerves II-XII intact.   Skin:   normal overall no scars, lesions, ulcers or rashes. No psoriasis.  Psychiatric: Alert and oriented x 3.  Recent memory intact, remote memory unclear.  Normal mood and affect. Well groomed.  Good eye contact.  Cardiovascular: overall no swelling, no varicosities, no edema bilaterally, normal temperatures of the legs and arms, no clubbing, cyanosis and good capillary refill.  Lymphatic: palpation is normal.   Extremities:the left foot and ankle has only sight tenderness medially with no swelling or bruising or redness. Inspection normal left foot and ankle Strength and  tone normal Range of motion full of the left foot and ankle. Pulses are present.  I have discussed with her about peripheral neuropathy.  Her diabetes is the main cause.  She is regulating her blood sugars well now and is seeing and endochronologist for this.  She just is tired of having more pain at night.  She will talk about this with him on her visit in a few weeks.  Her hypertension is under good control as well.  She has no distal edema or weight gain.  She is trying to be active but if she walks a lot, she has more night pain.  The patient has been educated about the nature of the problem(s) and counseled on treatment options.  The patient appeared to understand what I have discussed and is in agreement with it.  Encounter Diagnoses  Name Primary?  . Diabetic peripheral neuropathy associated with type 1 diabetes mellitus ([place]) Yes  . Left ankle pain     PLAN Call if any problems.  Precautions discussed.  Continue current medications.   Return to clinic 3 months

## 2015-09-15 ENCOUNTER — Other Ambulatory Visit: Payer: Self-pay | Admitting: "Endocrinology

## 2015-09-15 LAB — BASIC METABOLIC PANEL
BUN: 35 mg/dL — ABNORMAL HIGH (ref 7–25)
CHLORIDE: 102 mmol/L (ref 98–110)
CO2: 24 mmol/L (ref 20–31)
CREATININE: 2.37 mg/dL — AB (ref 0.60–0.93)
Calcium: 8.6 mg/dL (ref 8.6–10.4)
Glucose, Bld: 203 mg/dL — ABNORMAL HIGH (ref 65–99)
Potassium: 4.6 mmol/L (ref 3.5–5.3)
Sodium: 140 mmol/L (ref 135–146)

## 2015-09-15 LAB — HEMOGLOBIN A1C
Hgb A1c MFr Bld: 6.9 % — ABNORMAL HIGH (ref <5.7)
Mean Plasma Glucose: 151 mg/dL

## 2015-09-25 ENCOUNTER — Ambulatory Visit (INDEPENDENT_AMBULATORY_CARE_PROVIDER_SITE_OTHER): Payer: Medicare Other | Admitting: Cardiovascular Disease

## 2015-09-25 ENCOUNTER — Encounter: Payer: Self-pay | Admitting: Cardiovascular Disease

## 2015-09-25 VITALS — BP 152/58 | HR 59 | Ht 63.0 in | Wt 226.0 lb

## 2015-09-25 DIAGNOSIS — I10 Essential (primary) hypertension: Secondary | ICD-10-CM

## 2015-09-25 DIAGNOSIS — E785 Hyperlipidemia, unspecified: Secondary | ICD-10-CM

## 2015-09-25 DIAGNOSIS — I25708 Atherosclerosis of coronary artery bypass graft(s), unspecified, with other forms of angina pectoris: Secondary | ICD-10-CM | POA: Diagnosis not present

## 2015-09-25 DIAGNOSIS — I6523 Occlusion and stenosis of bilateral carotid arteries: Secondary | ICD-10-CM

## 2015-09-25 DIAGNOSIS — I5032 Chronic diastolic (congestive) heart failure: Secondary | ICD-10-CM

## 2015-09-25 NOTE — Progress Notes (Signed)
Patient ID: Briana [name], female   DOB: 01-26-1946, 70 y.o.   MRN: [id number]      SUBJECTIVE: The patient presents for follow-up of coronary artery disease, carotid artery stenosis, hypertension, and chronic diastolic heart failure. She underwent CABG on 05/23/2004 with LIMA to LAD, SVG to OM 2, and SVG to distal RCA.  Echocardiogram on 11/24/14 showed vigorous left ventricular systolic function, EF [id number], severe LVH, grade 2 diastolic dysfunction, elevated filling pressures, moderate left atrial enlargement, severe mitral annular calcification with mild mitral regurgitation, moderate aortic valve sclerosis with no stenosis, mild aortic regurgitation, mild right atrial enlargement, and moderate to severe pulmonary hypertension with PASP 56 mmHg.  Carotid Dopplers on 11/24/14 demonstrated proximal left internal carotid artery stenosis greater than 70% and less than 50% right internal carotid artery stenosis.  She is doing well and denies chest pain, shortness of breath, and leg swelling. She says her blood pressure has been normal at other appointments and at the local pharmacy.  BUN 35, creatinine 2.37 09/15/15.    Review of Systems: As per "subjective", otherwise negative.  Allergies  Allergen Reactions  . Carbamazepine Other (See Comments)    Unknown reaction-patient is not aware of this allergy  . Clonazepam Other (See Comments)    Patient is not aware of this allergy  . Gemfibrozil Other (See Comments)    Patient is not aware of this allergy but states that she has side effects to a cholesterol medication in the past  . Macrobid [Nitrofurantoin] Nausea And Vomiting  . Oxycodone Other (See Comments)    hallucinations    Current Outpatient Prescriptions  Medication Sig Dispense Refill  . aspirin EC 325 MG tablet Take 325 mg by mouth daily.    Marland Kitchen atorvastatin (LIPITOR) 80 MG tablet Take 80 mg by mouth every evening.     . cholecalciferol (VITAMIN D) 1000 UNITS tablet Take  1,000 Units by mouth 2 (two) times daily.     . diazepam (VALIUM) 5 MG tablet Take 5 mg by mouth at bedtime. *Will take one tablet daily as needed for anxiety    . furosemide (LASIX) 40 MG tablet Take 40 mg by mouth daily.     . hydrALAZINE (APRESOLINE) 100 MG tablet Take 1 tablet (100 mg total) by mouth 3 (three) times daily. 90 tablet 3  . HYDROcodone-acetaminophen (NORCO) 7.5-325 MG tablet Take 1 tablet by mouth every 4 (four) hours as needed for moderate pain (Must last 30 days.  Do not drive or operate machinery while taking this medicine.). 120 tablet 0  . insulin NPH-regular Human (NOVOLIN 70/30) (70-30) 100 UNIT/ML injection Inject 30 Units into the skin 2 (two) times daily with a meal. 10 mL 2  . JANUVIA 25 MG tablet Take 1 tablet (25 mg total) by mouth daily. 30 tablet 2  . metoprolol tartrate (LOPRESSOR) 25 MG tablet Take 1 tablet (25 mg total) by mouth 2 (two) times daily. 180 tablet 3  . potassium chloride SA (K-DUR,KLOR-CON) 20 MEQ tablet Take 20 mEq by mouth daily.     . sertraline (ZOLOFT) 100 MG tablet Take 200 mg by mouth daily.     Marland Kitchen tiZANidine (ZANAFLEX) 4 MG capsule Take 4 mg by mouth 3 (three) times daily as needed for muscle spasms (*Takes at bedtime*).      No current facility-administered medications for this visit.    Past Medical History  Diagnosis Date  . Diabetes mellitus   . Hypertension   . Hyperlipidemia   .  Heart murmur   . Peripheral vascular disease ([place])   . Arthritis   . Depression with anxiety   . Carotid artery occlusion   . Chronic kidney disease   . Neuropathy ([place])   . CAD (coronary artery disease)   . Rheumatic fever   . Sleep apnea   . PAD (peripheral artery disease) ([place])   . Adenomatous colon polyp 09/2007    Past Surgical History  Procedure Laterality Date  . Coronary artery bypass graft  2005  . Carotid endarterectomy  10/04/2009    Right CEA  . Tubal ligation    . Polypectomy  02/25/2012    Procedure: POLYPECTOMY;  Surgeon: Danie Binder, MD;  Location: [facility];  Service: Endoscopy;  Laterality: N/A;    Social History   Social History  . Marital Status: Married    Spouse Name: N/A  . Number of Children: N/A  . Years of Education: N/A   Occupational History  . Not on file.   Social History Main Topics  . Smoking status: Never Smoker   . Smokeless tobacco: Never Used  . Alcohol Use: No  . Drug Use: No  . Sexual Activity: Not on file   Other Topics Concern  . Not on file   Social History Narrative     Filed Vitals:   09/25/15 1053  BP: 152/58  Pulse: 59  Height: 5\' 3"  (1.6 m)  Weight: 226 lb (102.513 kg)  SpO2: 95%    PHYSICAL EXAM General: NAD HEENT: Normal. Neck: No JVD, no thyromegaly. Lungs: Clear to auscultation bilaterally with normal respiratory effort. CV: Nondisplaced PMI. Regular rate and rhythm, normal S1/S2, no [id number], 2/6 systolic murmur loudest along left sternal border. No pretibial nor periankle edema. Bilateral carotid bruits.  Abdomen: Soft, nontender, obese.  Neurologic: Alert and oriented x 3.  Psych: Normal affect. Skin: Normal. Musculoskeletal: No gross deformities. Extremities: No clubbing or cyanosis.   ECG: Most recent ECG reviewed.      ASSESSMENT AND PLAN: 1. CAD with h/o 3-v CABG in 04/2004: CCS class I-II exertional angina. Continue ASA, metoprolol, and statin therapy. Would consider stress testing in future if symptoms recur.  2. Essential HTN: Elevated today but reportedly normal on hydralazine 100 mg tid. No longer losartan (presumably due to CKD) nor amlodipine.   3. Hyperlipidemia: On Lipitor 80 mg.  4. Left internal carotid artery stenosis and s/p right CEA: Will repeat Dopplers in June 2017. Continue ASA and statin.  5. Chronic diastolic heart failure: Euvolemic. No changes.  Dispo: f/u 6 months.   Kate Sable, M.D., F.A.C.C.

## 2015-09-25 NOTE — Patient Instructions (Signed)
Your physician wants you to follow-up in:  6 months You will receive a reminder letter in the mail two months in advance. If you don't receive a letter, please call our office to schedule the follow-up appointment.    Your physician recommends that you continue on your current medications as directed. Please refer to the Current Medication list given to you today.     IN June Your physician has requested that you have a carotid duplex. This test is an ultrasound of the carotid arteries in your neck. It looks at blood flow through these arteries that supply the brain with blood. Allow one hour for this exam. There are no restrictions or special instructions.           Thank you for choosing [facility] !

## 2015-09-29 ENCOUNTER — Ambulatory Visit (HOSPITAL_COMMUNITY)
Admission: RE | Admit: 2015-09-29 | Discharge: 2015-09-29 | Disposition: A | Discharge status: Home / Self Care | Payer: Medicare Other | Source: Ambulatory Visit | Attending: Cardiovascular Disease | Admitting: Cardiovascular Disease

## 2015-09-29 DIAGNOSIS — I6523 Occlusion and stenosis of bilateral carotid arteries: Secondary | ICD-10-CM | POA: Insufficient documentation

## 2015-09-29 DIAGNOSIS — I130 Hypertensive heart and chronic kidney disease with heart failure and stage 1 through stage 4 chronic kidney disease, or unspecified chronic kidney disease: Secondary | ICD-10-CM | POA: Diagnosis not present

## 2015-09-29 DIAGNOSIS — R0602 Shortness of breath: Secondary | ICD-10-CM | POA: Diagnosis not present

## 2015-09-30 ENCOUNTER — Inpatient Hospital Stay (HOSPITAL_COMMUNITY)
Admission: EM | Admit: 2015-09-30 | Discharge: 2015-10-18 | DRG: 280 | Disposition: A | Discharge status: Rehabilitation Facility | Payer: Medicare Other | Attending: Internal Medicine | Admitting: Internal Medicine

## 2015-09-30 ENCOUNTER — Encounter (HOSPITAL_COMMUNITY): Payer: Self-pay

## 2015-09-30 ENCOUNTER — Emergency Department (HOSPITAL_COMMUNITY): Payer: Medicare Other

## 2015-09-30 DIAGNOSIS — R079 Chest pain, unspecified: Secondary | ICD-10-CM | POA: Diagnosis present

## 2015-09-30 DIAGNOSIS — I4510 Unspecified right bundle-branch block: Secondary | ICD-10-CM | POA: Diagnosis not present

## 2015-09-30 DIAGNOSIS — I00 Rheumatic fever without heart involvement: Secondary | ICD-10-CM | POA: Diagnosis present

## 2015-09-30 DIAGNOSIS — I160 Hypertensive urgency: Secondary | ICD-10-CM | POA: Diagnosis not present

## 2015-09-30 DIAGNOSIS — I214 Non-ST elevation (NSTEMI) myocardial infarction: Secondary | ICD-10-CM | POA: Diagnosis present

## 2015-09-30 DIAGNOSIS — Z6838 Body mass index (BMI) 38.0-38.9, adult: Secondary | ICD-10-CM

## 2015-09-30 DIAGNOSIS — R059 Cough, unspecified: Secondary | ICD-10-CM

## 2015-09-30 DIAGNOSIS — I480 Paroxysmal atrial fibrillation: Secondary | ICD-10-CM | POA: Diagnosis not present

## 2015-09-30 DIAGNOSIS — E1151 Type 2 diabetes mellitus with diabetic peripheral angiopathy without gangrene: Secondary | ICD-10-CM | POA: Diagnosis not present

## 2015-09-30 DIAGNOSIS — I5033 Acute on chronic diastolic (congestive) heart failure: Secondary | ICD-10-CM | POA: Diagnosis not present

## 2015-09-30 DIAGNOSIS — E1140 Type 2 diabetes mellitus with diabetic neuropathy, unspecified: Secondary | ICD-10-CM | POA: Diagnosis not present

## 2015-09-30 DIAGNOSIS — R0902 Hypoxemia: Secondary | ICD-10-CM | POA: Diagnosis present

## 2015-09-30 DIAGNOSIS — G4730 Sleep apnea, unspecified: Secondary | ICD-10-CM | POA: Diagnosis not present

## 2015-09-30 DIAGNOSIS — I4892 Unspecified atrial flutter: Secondary | ICD-10-CM | POA: Diagnosis not present

## 2015-09-30 DIAGNOSIS — N179 Acute kidney failure, unspecified: Secondary | ICD-10-CM | POA: Insufficient documentation

## 2015-09-30 DIAGNOSIS — J810 Acute pulmonary edema: Secondary | ICD-10-CM | POA: Diagnosis present

## 2015-09-30 DIAGNOSIS — G4733 Obstructive sleep apnea (adult) (pediatric): Secondary | ICD-10-CM | POA: Diagnosis present

## 2015-09-30 DIAGNOSIS — E785 Hyperlipidemia, unspecified: Secondary | ICD-10-CM | POA: Diagnosis not present

## 2015-09-30 DIAGNOSIS — Z7984 Long term (current) use of oral hypoglycemic drugs: Secondary | ICD-10-CM

## 2015-09-30 DIAGNOSIS — E1165 Type 2 diabetes mellitus with hyperglycemia: Secondary | ICD-10-CM | POA: Diagnosis present

## 2015-09-30 DIAGNOSIS — J440 Chronic obstructive pulmonary disease with acute lower respiratory infection: Secondary | ICD-10-CM | POA: Diagnosis present

## 2015-09-30 DIAGNOSIS — N170 Acute kidney failure with tubular necrosis: Secondary | ICD-10-CM | POA: Diagnosis not present

## 2015-09-30 DIAGNOSIS — I2510 Atherosclerotic heart disease of native coronary artery without angina pectoris: Secondary | ICD-10-CM | POA: Diagnosis present

## 2015-09-30 DIAGNOSIS — R5381 Other malaise: Secondary | ICD-10-CM | POA: Diagnosis present

## 2015-09-30 DIAGNOSIS — Z794 Long term (current) use of insulin: Secondary | ICD-10-CM

## 2015-09-30 DIAGNOSIS — R7989 Other specified abnormal findings of blood chemistry: Secondary | ICD-10-CM | POA: Diagnosis present

## 2015-09-30 DIAGNOSIS — I130 Hypertensive heart and chronic kidney disease with heart failure and stage 1 through stage 4 chronic kidney disease, or unspecified chronic kidney disease: Secondary | ICD-10-CM | POA: Diagnosis not present

## 2015-09-30 DIAGNOSIS — R0602 Shortness of breath: Secondary | ICD-10-CM | POA: Diagnosis present

## 2015-09-30 DIAGNOSIS — E872 Acidosis: Secondary | ICD-10-CM | POA: Diagnosis present

## 2015-09-30 DIAGNOSIS — Z951 Presence of aortocoronary bypass graft: Secondary | ICD-10-CM

## 2015-09-30 DIAGNOSIS — R9439 Abnormal result of other cardiovascular function study: Secondary | ICD-10-CM | POA: Diagnosis present

## 2015-09-30 DIAGNOSIS — R778 Other specified abnormalities of plasma proteins: Secondary | ICD-10-CM | POA: Diagnosis present

## 2015-09-30 DIAGNOSIS — J9601 Acute respiratory failure with hypoxia: Secondary | ICD-10-CM | POA: Diagnosis present

## 2015-09-30 DIAGNOSIS — N184 Chronic kidney disease, stage 4 (severe): Secondary | ICD-10-CM | POA: Diagnosis present

## 2015-09-30 DIAGNOSIS — E118 Type 2 diabetes mellitus with unspecified complications: Secondary | ICD-10-CM | POA: Diagnosis present

## 2015-09-30 DIAGNOSIS — IMO0002 Reserved for concepts with insufficient information to code with codable children: Secondary | ICD-10-CM | POA: Diagnosis present

## 2015-09-30 DIAGNOSIS — F418 Other specified anxiety disorders: Secondary | ICD-10-CM | POA: Diagnosis present

## 2015-09-30 DIAGNOSIS — D62 Acute posthemorrhagic anemia: Secondary | ICD-10-CM | POA: Diagnosis present

## 2015-09-30 DIAGNOSIS — J9600 Acute respiratory failure, unspecified whether with hypoxia or hypercapnia: Secondary | ICD-10-CM | POA: Diagnosis present

## 2015-09-30 DIAGNOSIS — N189 Chronic kidney disease, unspecified: Secondary | ICD-10-CM | POA: Diagnosis present

## 2015-09-30 DIAGNOSIS — D649 Anemia, unspecified: Secondary | ICD-10-CM | POA: Diagnosis not present

## 2015-09-30 DIAGNOSIS — R05 Cough: Secondary | ICD-10-CM

## 2015-09-30 DIAGNOSIS — J189 Pneumonia, unspecified organism: Secondary | ICD-10-CM | POA: Diagnosis present

## 2015-09-30 DIAGNOSIS — J811 Chronic pulmonary edema: Secondary | ICD-10-CM | POA: Diagnosis present

## 2015-09-30 DIAGNOSIS — I272 Other secondary pulmonary hypertension: Secondary | ICD-10-CM | POA: Diagnosis not present

## 2015-09-30 DIAGNOSIS — I161 Hypertensive emergency: Secondary | ICD-10-CM | POA: Diagnosis not present

## 2015-09-30 DIAGNOSIS — Z452 Encounter for adjustment and management of vascular access device: Secondary | ICD-10-CM

## 2015-09-30 DIAGNOSIS — I82409 Acute embolism and thrombosis of unspecified deep veins of unspecified lower extremity: Secondary | ICD-10-CM | POA: Diagnosis present

## 2015-09-30 DIAGNOSIS — E669 Obesity, unspecified: Secondary | ICD-10-CM | POA: Diagnosis not present

## 2015-09-30 DIAGNOSIS — Z7982 Long term (current) use of aspirin: Secondary | ICD-10-CM

## 2015-09-30 DIAGNOSIS — I4891 Unspecified atrial fibrillation: Secondary | ICD-10-CM | POA: Diagnosis present

## 2015-09-30 DIAGNOSIS — I509 Heart failure, unspecified: Secondary | ICD-10-CM

## 2015-09-30 DIAGNOSIS — E1122 Type 2 diabetes mellitus with diabetic chronic kidney disease: Secondary | ICD-10-CM | POA: Diagnosis present

## 2015-09-30 HISTORY — DX: Unspecified atrial fibrillation: I48.91

## 2015-09-30 LAB — URINE MICROSCOPIC-ADD ON

## 2015-09-30 LAB — BLOOD GAS, ARTERIAL
ACID-BASE EXCESS: 5.5 mmol/L — AB (ref 0.0–2.0)
BICARBONATE: 19.4 meq/L — AB (ref 20.0–24.0)
DELIVERY SYSTEMS: POSITIVE
DRAWN BY: 283401
Expiratory PAP: 6
FIO2: 0.7
Inspiratory PAP: 12
O2 Saturation: 77.2 %
PH ART: 7.27 — AB (ref 7.350–7.450)
Patient temperature: 101.6
RATE: 10 resp/min
TCO2: 44.9 mmol/L (ref 0–100)
pCO2 arterial: 44.9 mmHg (ref 35.0–45.0)
pO2, Arterial: 48.6 mmHg — ABNORMAL LOW (ref 80.0–100.0)

## 2015-09-30 LAB — CBC WITH DIFFERENTIAL/PLATELET
Basophils Absolute: 0.1 10*3/uL (ref 0.0–0.1)
Basophils Relative: 0 %
EOS ABS: 0.3 10*3/uL (ref 0.0–0.7)
EOS PCT: 2 %
HCT: 33.5 % — ABNORMAL LOW (ref 36.0–46.0)
HEMOGLOBIN: 10.6 g/dL — AB (ref 12.0–15.0)
LYMPHS ABS: 2.2 10*3/uL (ref 0.7–4.0)
LYMPHS PCT: 15 %
MCH: 27.7 pg (ref 26.0–34.0)
MCHC: 31.6 g/dL (ref 30.0–36.0)
MCV: 87.5 fL (ref 78.0–100.0)
MONOS PCT: 7 %
Monocytes Absolute: 1.2 10*3/uL — ABNORMAL HIGH (ref 0.1–1.0)
NEUTROS PCT: 76 %
Neutro Abs: 11.7 10*3/uL — ABNORMAL HIGH (ref 1.7–7.7)
Platelets: 215 10*3/uL (ref 150–400)
RBC: 3.83 MIL/uL — AB (ref 3.87–5.11)
RDW: 15.1 % (ref 11.5–15.5)
WBC: 15.5 10*3/uL — AB (ref 4.0–10.5)

## 2015-09-30 LAB — COMPREHENSIVE METABOLIC PANEL
ALBUMIN: 3.6 g/dL (ref 3.5–5.0)
ALK PHOS: 72 U/L (ref 38–126)
ALT: 11 U/L — AB (ref 14–54)
AST: 16 U/L (ref 15–41)
Anion gap: 11 (ref 5–15)
BUN: 42 mg/dL — AB (ref 6–20)
CHLORIDE: 105 mmol/L (ref 101–111)
CO2: 20 mmol/L — AB (ref 22–32)
CREATININE: 2.17 mg/dL — AB (ref 0.44–1.00)
Calcium: 9 mg/dL (ref 8.9–10.3)
GFR calc Af Amer: 25 mL/min — ABNORMAL LOW (ref >60)
GFR, EST NON AFRICAN AMERICAN: 22 mL/min — AB (ref >60)
Glucose, Bld: 302 mg/dL — ABNORMAL HIGH (ref 65–99)
Potassium: 4.4 mmol/L (ref 3.5–5.1)
SODIUM: 136 mmol/L (ref 135–145)
Total Bilirubin: 0.7 mg/dL (ref 0.3–1.2)
Total Protein: 7.4 g/dL (ref 6.5–8.1)

## 2015-09-30 LAB — URINALYSIS, ROUTINE W REFLEX MICROSCOPIC
BILIRUBIN URINE: NEGATIVE
GLUCOSE, UA: 250 mg/dL — AB
HGB URINE DIPSTICK: NEGATIVE
KETONES UR: NEGATIVE mg/dL
Leukocytes, UA: NEGATIVE
Nitrite: NEGATIVE
Specific Gravity, Urine: 1.02 (ref 1.005–1.030)
pH: 5.5 (ref 5.0–8.0)

## 2015-09-30 LAB — TROPONIN I: TROPONIN I: 0.12 ng/mL — AB (ref <0.031)

## 2015-09-30 LAB — BRAIN NATRIURETIC PEPTIDE: B NATRIURETIC PEPTIDE 5: 894 pg/mL — AB (ref 0.0–100.0)

## 2015-09-30 LAB — I-STAT CG4 LACTIC ACID, ED: Lactic Acid, Venous: 1.68 mmol/L (ref 0.5–2.0)

## 2015-09-30 MED ORDER — DEXTROSE 5 % IV SOLN
1.0000 g | INTRAVENOUS | Status: DC
  Administered 2015-10-01 – 2015-10-04 (×4): 1 g via INTRAVENOUS
  Filled 2015-09-30 (×5): qty 10

## 2015-09-30 MED ORDER — DEXTROSE 5 % IV SOLN
500.0000 mg | INTRAVENOUS | Status: DC
  Administered 2015-10-01 – 2015-10-03 (×3): 500 mg via INTRAVENOUS
  Filled 2015-09-30 (×4): qty 500

## 2015-09-30 MED ORDER — DEXTROSE 5 % IV SOLN
500.0000 mg | Freq: Once | INTRAVENOUS | Status: AC
  Administered 2015-09-30: 500 mg via INTRAVENOUS
  Filled 2015-09-30: qty 500

## 2015-09-30 MED ORDER — ACETAMINOPHEN 500 MG PO TABS
1000.0000 mg | ORAL_TABLET | Freq: Once | ORAL | Status: AC
  Administered 2015-09-30: 1000 mg via ORAL
  Filled 2015-09-30: qty 2

## 2015-09-30 MED ORDER — DEXTROSE 5 % IV SOLN
1.0000 g | Freq: Once | INTRAVENOUS | Status: AC
  Administered 2015-09-30: 1 g via INTRAVENOUS
  Filled 2015-09-30: qty 10

## 2015-09-30 NOTE — ED Notes (Addendum)
EKG printed and given to DR. Jacubowitz.

## 2015-09-30 NOTE — ED Notes (Signed)
Briana [name] [phone]

## 2015-09-30 NOTE — ED Notes (Signed)
Chest pain that started last night. Having shortness of breath also. CABG 11 years ago,

## 2015-09-30 NOTE — Progress Notes (Signed)
eLink Physician-Brief Progress Note Patient Name: Briana [name] DOB: March 19, 1946 MRN: [id number]   Date of Service  09/30/2015  HPI/Events of Note  Call from ER doc at [facility] - no hcap risk factors - with fever, SIRS, BiPAP need 70% fio2, pulm infiltrates  - also EKG change and trop leak - and AKI    Recent Labs Lab 09/30/15 2140  TROPONINI 0.12*    Recent Labs Lab 09/30/15 2140  CREATININE 2.17*      eICU Interventions  Ok for [facility]     Intervention Category Major Interventions: Other:;Sepsis - evaluation and management  Eugine Bubb 09/30/2015, 11:48 PM

## 2015-09-30 NOTE — ED Notes (Signed)
resp and Dr Winfred Leeds at bedside for bipap

## 2015-09-30 NOTE — ED Notes (Addendum)
No fluids at this time per Dr. Leanne Lovely due to BP

## 2015-09-30 NOTE — Progress Notes (Signed)
ANTIBIOTIC CONSULT NOTE - INITIAL  Pharmacy Consult for Ceftriaxone and Azithromycin Indication: pneumonia  Allergies  Allergen Reactions  . Carbamazepine Other (See Comments)    Unknown reaction-patient is not aware of this allergy  . Clonazepam Other (See Comments)    Patient is not aware of this allergy  . Gemfibrozil Other (See Comments)    Patient is not aware of this allergy but states that she has side effects to a cholesterol medication in the past  . Macrobid [Nitrofurantoin] Nausea And Vomiting  . Oxycodone Other (See Comments)    hallucinations    Patient Measurements: Height: 5\' 5"  (165.1 cm) Weight: 230 lb (104.327 kg) IBW/kg (Calculated) : 57 Adjusted Body Weight:   Vital Signs: Temp: 101.6 F (38.7 C) (05/06 2149) Temp Source: Rectal (05/06 2149) BP: 203/60 mmHg (05/06 2200) Pulse Rate: 79 (05/06 2200) Intake/Output from previous day:   Intake/Output from this shift:    Labs: No results for input(s): WBC, HGB, PLT, LABCREA, CREATININE in the last 72 hours. Estimated Creatinine Clearance: 26.5 mL/min (by C-G formula based on Cr of 2.37). No results for input(s): VANCOTROUGH, VANCOPEAK, VANCORANDOM, GENTTROUGH, GENTPEAK, GENTRANDOM, TOBRATROUGH, TOBRAPEAK, TOBRARND, AMIKACINPEAK, AMIKACINTROU, AMIKACIN in the last 72 hours.   Microbiology: No results found for this or any previous visit (from the past 720 hour(s)).  Medical History: Past Medical History  Diagnosis Date  . Diabetes mellitus   . Hypertension   . Hyperlipidemia   . Heart murmur   . Peripheral vascular disease ([place])   . Arthritis   . Depression with anxiety   . Carotid artery occlusion   . Chronic kidney disease   . Neuropathy ([place])   . CAD (coronary artery disease)   . Rheumatic fever   . Sleep apnea   . PAD (peripheral artery disease) ([place])   . Adenomatous colon polyp 09/2007    Medications:   (Not in a hospital admission) Assessment: 70 yo female ED patient, CAP  Goal of  Therapy:  Eradicate infection  Plan:  Ceftriaxone 1 GM IV every 24 hours Azithromycin 500 mg IV every 24 hours Labs per protocol F/U oral therapy when appropriate  Abner Greenspan, Refugio Mcconico Bennett 09/30/2015,10:17 PM

## 2015-09-30 NOTE — ED Provider Notes (Signed)
CSN: [id number]     Arrival date & time 09/30/15  2124 History   First MD Initiated Contact with Patient 09/30/15 2138     Chief Complaint  Patient presents with  . Chest Pain     (Consider location/radiation/quality/duration/timing/severity/associated sxs/prior Treatment) HPI Complains of cough and shortness of breath and anterior chest pain bilateral gradual onset 24 hours ago. Chest pain is constant. Cough is nonproductive. No other associated symptoms. No treatment prior to coming here. She did take her aspirin 325 mg earlier today. She did not take her other blood pressure medications earlier today because she was feeling poorly. No other associated symptoms. Denies orthopnea. Past Medical History  Diagnosis Date  . Diabetes mellitus   . Hypertension   . Hyperlipidemia   . Heart murmur   . Peripheral vascular disease ([place])   . Arthritis   . Depression with anxiety   . Carotid artery occlusion   . Chronic kidney disease   . Neuropathy ([place])   . CAD (coronary artery disease)   . Rheumatic fever   . Sleep apnea   . PAD (peripheral artery disease) ([place])   . Adenomatous colon polyp 09/2007   Past Surgical History  Procedure Laterality Date  . Coronary artery bypass graft  2005  . Carotid endarterectomy  10/04/2009    Right CEA  . Tubal ligation    . Polypectomy  02/25/2012    Procedure: POLYPECTOMY;  Surgeon: Danie Binder, MD;  Location: [facility];  Service: Endoscopy;  Laterality: N/A;   Family History  Problem Relation Age of Onset  . Breast cancer Mother   . Heart disease Father   . Diabetes Father   . Heart disease Brother    Social History  Substance Use Topics  . Smoking status: Never Smoker   . Smokeless tobacco: Never Used  . Alcohol Use: No   OB History    No data available     Review of Systems  Constitutional: Negative.   HENT: Negative.   Respiratory: Positive for cough and shortness of breath.   Cardiovascular: Positive for chest pain.   Gastrointestinal: Negative.   Musculoskeletal: Negative.   Skin: Negative.   Allergic/Immunologic: Positive for immunocompromised state.  Neurological: Negative.   Psychiatric/Behavioral: Negative.   All other systems reviewed and are negative.     Allergies  Carbamazepine; Clonazepam; Gemfibrozil; Macrobid; and Oxycodone  Home Medications   Prior to Admission medications   Medication Sig Start Date End Date Taking? Authorizing Provider  aspirin EC 325 MG tablet Take 325 mg by mouth daily.    Historical Provider, MD  atorvastatin (LIPITOR) 80 MG tablet Take 80 mg by mouth every evening.  01/10/14   Historical Provider, MD  cholecalciferol (VITAMIN D) 1000 UNITS tablet Take 1,000 Units by mouth 2 (two) times daily.     Historical Provider, MD  diazepam (VALIUM) 5 MG tablet Take 5 mg by mouth at bedtime. *Will take one tablet daily as needed for anxiety    Historical Provider, MD  furosemide (LASIX) 40 MG tablet Take 40 mg by mouth daily.     Historical Provider, MD  hydrALAZINE (APRESOLINE) 100 MG tablet Take 1 tablet (100 mg total) by mouth 3 (three) times daily. 06/27/15   Herminio Commons, MD  HYDROcodone-acetaminophen (NORCO) 7.5-325 MG tablet Take 1 tablet by mouth every 4 (four) hours as needed for moderate pain (Must last 30 days.  Do not drive or operate machinery while taking this medicine.). 08/17/15   Patrick Jupiter  Luna Glasgow, MD  insulin NPH-regular Human (NOVOLIN 70/30) (70-30) 100 UNIT/ML injection Inject 30 Units into the skin 2 (two) times daily with a meal. 06/29/15   Cassandria Anger, MD  JANUVIA 25 MG tablet Take 1 tablet (25 mg total) by mouth daily. 06/29/15   Cassandria Anger, MD  metoprolol tartrate (LOPRESSOR) 25 MG tablet Take 1 tablet (25 mg total) by mouth 2 (two) times daily. 05/10/15   Herminio Commons, MD  potassium chloride SA (K-DUR,KLOR-CON) 20 MEQ tablet Take 20 mEq by mouth daily.     Historical Provider, MD  sertraline (ZOLOFT) 100 MG tablet Take 200 mg  by mouth daily.  02/23/14   Historical Provider, MD  tiZANidine (ZANAFLEX) 4 MG capsule Take 4 mg by mouth 3 (three) times daily as needed for muscle spasms (*Takes at bedtime*).     Historical Provider, MD   BP 201/65 mmHg  Pulse 77  Temp(Src) 101.6 F (38.7 C) (Rectal)  Resp 18  Ht 5\' 5"  (1.651 m)  Wt 230 lb (104.327 kg)  BMI 38.27 kg/m2  SpO2 94% Physical Exam  Constitutional: She appears well-developed and well-nourished.  HENT:  Head: Normocephalic and atraumatic.  Eyes: Conjunctivae are normal. Pupils are equal, round, and reactive to light.  Neck: Neck supple. No tracheal deviation present. No thyromegaly present.  Cardiovascular: Normal rate and regular rhythm.   No murmur heard. Pulmonary/Chest: Effort normal and breath sounds normal. No respiratory distress.  Abdominal: Soft. Bowel sounds are normal. She exhibits no distension. There is no tenderness.  Musculoskeletal: Normal range of motion. She exhibits no edema or tenderness.  Neurological: She is alert. Coordination normal.  Skin: Skin is warm and dry. No rash noted.  Psychiatric: She has a normal mood and affect.  Nursing note and vitals reviewed.   ED Course  Procedures (including critical care time) Labs Review Labs Reviewed  CULTURE, BLOOD (ROUTINE X 2)  CULTURE, BLOOD (ROUTINE X 2)  URINE CULTURE  COMPREHENSIVE METABOLIC PANEL  CBC WITH DIFFERENTIAL/PLATELET  URINALYSIS, ROUTINE W REFLEX MICROSCOPIC (NOT AT [facility])  TROPONIN I  I-STAT CG4 LACTIC ACID, ED    Imaging Review US Carotid Duplex Bilateral  09/29/2015  CLINICAL DATA:  Bilateral carotid artery stenosis. EXAM: BILATERAL CAROTID DUPLEX ULTRASOUND TECHNIQUE: Pearline Cables scale imaging, color Doppler and duplex ultrasound were performed of bilateral carotid and vertebral arteries in the neck. COMPARISON:  11/24/2014. FINDINGS: Criteria: Quantification of carotid stenosis is based on velocity parameters that correlate the residual internal carotid diameter with  NASCET-based stenosis levels, using the diameter of the distal internal carotid lumen as the denominator for stenosis measurement. The following velocity measurements were obtained: RIGHT ICA:  113/16 cm/sec CCA:  123456 cm/sec SYSTOLIC ICA/CCA RATIO:  1.0 DIASTOLIC ICA/CCA RATIO:  1.5 ECA:  128 cm/sec LEFT ICA:  393/82 cm/sec CCA:  A999333 cm/sec SYSTOLIC ICA/CCA RATIO:  4.6 DIASTOLIC ICA/CCA RATIO:  5.8 ECA:  160 cm/sec RIGHT CAROTID ARTERY: Mild to moderate right carotid bifurcation atherosclerotic vascular disease. No flow limiting stenosis. Degree of stenosis less than 50%. RIGHT VERTEBRAL ARTERY:  Patent with antegrade flow. LEFT CAROTID ARTERY: Prominent left carotid bifurcation and proximal ICA atherosclerotic vascular plaque. Degree of stenosis greater than 70%. LEFT VERTEBRAL ARTERY:  Patent antegrade flow. IMPRESSION: 1. Greater than 70 degrees stenosis left carotid bifurcation and proximal ICA. Vascular surgery consultation suggested. 2. Atherosclerotic vascular plaque right carotid bifurcation. Degree of stenosis less than 50%. Vertebral arteries are patent antegrade flow. Electronically Signed   By: Marcello Moores  Register  On: 09/29/2015 13:15   I have personally reviewed and evaluated these images and lab results as part of my medical decision-making.   EKG Interpretation   Date/Time:  Saturday Sep 30 2015 21:33:04 EDT Ventricular Rate:  123 PR Interval:  133 QRS Duration: 160 QT Interval:  386 QTC Calculation: 552 R Axis:   -77 Text Interpretation:  Sinus tachycardia with irregular rate RBBB and LAFB  Abnormal T, consider ischemia, lateral leads Baseline wander in lead(s) II  III aVF Inferior T wave changes SINCE LAST TRACING HEART RATE HAS  INCREASED Confirmed by Winfred Leeds  MD, Kambrie Eddleman [phone]) on 09/30/2015 9:38:46 PM     At 10:40 PM Asian is resting comfortably on BiPAP feels improved after treatment with intravenous antibiotics and oxygen.   11:50 PM patient is resting comfortably and  states "I feel much improved. Chest x-ray viewed by me. Results for orders placed or performed during the hospital encounter of 09/30/15  Blood Culture (routine x 2)  Result Value Ref Range   Specimen Description BLOOD RIGHT HAND    Special Requests BOTTLES DRAWN AEROBIC ONLY 6CC    Culture PENDING    Report Status PENDING   Blood Culture (routine x 2)  Result Value Ref Range   Specimen Description BLOOD LEFT ARM    Special Requests      BOTTLES DRAWN AEROBIC AND ANAEROBIC 6CC DRAWN BY RN   Culture PENDING    Report Status PENDING   Comprehensive metabolic panel  Result Value Ref Range   Sodium 136 135 - 145 mmol/L   Potassium 4.4 3.5 - 5.1 mmol/L   Chloride 105 101 - 111 mmol/L   CO2 20 (L) 22 - 32 mmol/L   Glucose, Bld 302 (H) 65 - 99 mg/dL   BUN 42 (H) 6 - 20 mg/dL   Creatinine, Ser 2.17 (H) 0.44 - 1.00 mg/dL   Calcium 9.0 8.9 - 10.3 mg/dL   Total Protein 7.4 6.5 - 8.1 g/dL   Albumin 3.6 3.5 - 5.0 g/dL   AST 16 15 - 41 U/L   ALT 11 (L) 14 - 54 U/L   Alkaline Phosphatase 72 38 - 126 U/L   Total Bilirubin 0.7 0.3 - 1.2 mg/dL   GFR calc non Af Amer 22 (L) >60 mL/min   GFR calc Af Amer 25 (L) >60 mL/min   Anion gap 11 5 - 15  CBC WITH DIFFERENTIAL  Result Value Ref Range   WBC 15.5 (H) 4.0 - 10.5 K/uL   RBC 3.83 (L) 3.87 - 5.11 MIL/uL   Hemoglobin 10.6 (L) 12.0 - 15.0 g/dL   HCT 33.5 (L) 36.0 - 46.0 %   MCV 87.5 78.0 - 100.0 fL   MCH 27.7 26.0 - 34.0 pg   MCHC 31.6 30.0 - 36.0 g/dL   RDW 15.1 11.5 - 15.5 %   Platelets 215 150 - 400 K/uL   Neutrophils Relative % 76 %   Neutro Abs 11.7 (H) 1.7 - 7.7 K/uL   Lymphocytes Relative 15 %   Lymphs Abs 2.2 0.7 - 4.0 K/uL   Monocytes Relative 7 %   Monocytes Absolute 1.2 (H) 0.1 - 1.0 K/uL   Eosinophils Relative 2 %   Eosinophils Absolute 0.3 0.0 - 0.7 K/uL   Basophils Relative 0 %   Basophils Absolute 0.1 0.0 - 0.1 K/uL  Urinalysis, Routine w reflex microscopic (not at [facility])  Result Value Ref Range   Color, Urine YELLOW  YELLOW   APPearance HAZY (A) CLEAR  Specific Gravity, Urine 1.020 1.005 - 1.030   pH 5.5 5.0 - 8.0   Glucose, UA 250 (A) NEGATIVE mg/dL   Hgb urine dipstick NEGATIVE NEGATIVE   Bilirubin Urine NEGATIVE NEGATIVE   Ketones, ur NEGATIVE NEGATIVE mg/dL   Protein, ur >300 (A) NEGATIVE mg/dL   Nitrite NEGATIVE NEGATIVE   Leukocytes, UA NEGATIVE NEGATIVE  Troponin I  Result Value Ref Range   Troponin I 0.12 (H) <0.031 ng/mL  Blood gas, arterial  Result Value Ref Range   FIO2 0.70    Delivery systems BILEVEL POSITIVE AIRWAY PRESSURE    LHR 10 resp/min   Inspiratory PAP 12.0    Expiratory PAP 6.0    pH, Arterial 7.27 (L) 7.350 - 7.450   pCO2 arterial 44.9 35.0 - 45.0 mmHg   pO2, Arterial 48.6 (L) 80.0 - 100.0 mmHg   Bicarbonate 19.4 (L) 20.0 - 24.0 mEq/L   TCO2 44.9 0 - 100 mmol/L   Acid-Base Excess 5.5 (H) 0.0 - 2.0 mmol/L   O2 Saturation 77.2 %   Patient temperature 101.6    Collection site RIGHT RADIAL    Drawn by [id number]    Sample type ARTERIAL DRAW    Allens test (pass/fail) PASS PASS  Brain natriuretic peptide  Result Value Ref Range   B Natriuretic Peptide 894.0 (H) 0.0 - 100.0 pg/mL  Urine microscopic-add on  Result Value Ref Range   Squamous Epithelial / LPF 0-5 (A) NONE SEEN   WBC, UA 0-5 0 - 5 WBC/hpf   RBC / HPF 0-5 0 - 5 RBC/hpf   Bacteria, UA FEW (A) NONE SEEN   Urine-Other AMORPHOUS URATES/PHOSPHATES   I-Stat CG4 Lactic Acid, ED  (not at  [facility])  Result Value Ref Range   Lactic Acid, Venous 1.68 0.5 - 2.0 mmol/L   US Carotid Duplex Bilateral  09/29/2015  CLINICAL DATA:  Bilateral carotid artery stenosis. EXAM: BILATERAL CAROTID DUPLEX ULTRASOUND TECHNIQUE: Pearline Cables scale imaging, color Doppler and duplex ultrasound were performed of bilateral carotid and vertebral arteries in the neck. COMPARISON:  11/24/2014. FINDINGS: Criteria: Quantification of carotid stenosis is based on velocity parameters that correlate the residual internal carotid diameter with NASCET-based  stenosis levels, using the diameter of the distal internal carotid lumen as the denominator for stenosis measurement. The following velocity measurements were obtained: RIGHT ICA:  113/16 cm/sec CCA:  123456 cm/sec SYSTOLIC ICA/CCA RATIO:  1.0 DIASTOLIC ICA/CCA RATIO:  1.5 ECA:  128 cm/sec LEFT ICA:  393/82 cm/sec CCA:  [id number] cm/sec SYSTOLIC ICA/CCA RATIO:  4.6 DIASTOLIC ICA/CCA RATIO:  5.8 ECA:  160 cm/sec RIGHT CAROTID ARTERY: Mild to moderate right carotid bifurcation atherosclerotic vascular disease. No flow limiting stenosis. Degree of stenosis less than 50%. RIGHT VERTEBRAL ARTERY:  Patent with antegrade flow. LEFT CAROTID ARTERY: Prominent left carotid bifurcation and proximal ICA atherosclerotic vascular plaque. Degree of stenosis greater than 70%. LEFT VERTEBRAL ARTERY:  Patent antegrade flow. IMPRESSION: 1. Greater than 70 degrees stenosis left carotid bifurcation and proximal ICA. Vascular surgery consultation suggested. 2. Atherosclerotic vascular plaque right carotid bifurcation. Degree of stenosis less than 50%. Vertebral arteries are patent antegrade flow. Electronically Signed   By: Arthur   On: 09/29/2015 13:15   Dg Chest Port 1 View  09/30/2015  CLINICAL DATA:  Dyspnea, cough, fever. EXAM: PORTABLE CHEST 1 VIEW COMPARISON:  05/31/2015 FINDINGS: There is unchanged moderate cardiomegaly. There is patchy right base opacity which could represent pneumonia. No large effusion. IMPRESSION: Patchy right lung base opacity, potentially pneumonia although  hemorrhage or asymmetric edema could also produce this appearance. Electronically Signed   By: Andreas Newport M.D.   On: 09/30/2015 22:33    MDM  Code sepsis called based on sirs criteria of fever and leukocytosis. Suspected source respiratory. Favor pneumonia versus pulmonary edema in light of cough, fever, leukocytosis Final diagnoses:  None  Patient treated for community-acquired pneumonia. Intravenous fluid was not indicated. She  has no mental status change and lactate level less than 4, with elevated BNP. No evidence of septic shock and she does have an oxygen requirement, only sustainable with BiPAP. I spoke with Dr.Lama via telephone who states that patient requires transfer to [facility] as [facility] does not have the capacity to treat her should she decompensate . I consulted Dr.Ramaswamy,, from critical care service at [facility] who accepts patient in transfer. EKG and troponin consistent with N STEMI. Plan intravenous antibiotics continue BiPAP. Renal insufficiency is chronic. Diagnosis #1 acute respiratory failure #2 community-acquired pneumonia #3NSTEMI #4 hyperglycemia #5 renal insufficiency #Anemia CRITICAL CARE Performed by: Orlie Dakin Total critical care time: 50 minutes Critical care time was exclusive of separately billable procedures and treating other patients. Critical care was necessary to treat or prevent imminent or life-threatening deterioration. Critical care was time spent personally by me on the following activities: development of treatment plan with patient and/or surrogate as well as nursing, discussions with consultants, evaluation of patient's response to treatment, examination of patient, obtaining history from patient or surrogate, ordering and performing treatments and interventions, ordering and review of laboratory studies, ordering and review of radiographic studies, pulse oximetry and re-evaluation of patient's condition.        Orlie Dakin, MD 09/30/15 [phone]

## 2015-09-30 NOTE — Progress Notes (Signed)
CAlled to place pt on VM. Pt on 55% VM SPO2 92-94%. Placed pt on Bipap per MD. 12/6 70% BBS = diminished. SPO2 98%. Pt tolerating well at this time Rt will continue to monitor

## 2015-10-01 ENCOUNTER — Inpatient Hospital Stay (HOSPITAL_COMMUNITY): Payer: Medicare Other

## 2015-10-01 DIAGNOSIS — I272 Other secondary pulmonary hypertension: Secondary | ICD-10-CM | POA: Diagnosis present

## 2015-10-01 DIAGNOSIS — J189 Pneumonia, unspecified organism: Secondary | ICD-10-CM | POA: Diagnosis not present

## 2015-10-01 DIAGNOSIS — I4892 Unspecified atrial flutter: Secondary | ICD-10-CM | POA: Diagnosis present

## 2015-10-01 DIAGNOSIS — D62 Acute posthemorrhagic anemia: Secondary | ICD-10-CM | POA: Diagnosis not present

## 2015-10-01 DIAGNOSIS — R05 Cough: Secondary | ICD-10-CM | POA: Diagnosis not present

## 2015-10-01 DIAGNOSIS — R778 Other specified abnormalities of plasma proteins: Secondary | ICD-10-CM | POA: Diagnosis present

## 2015-10-01 DIAGNOSIS — R0600 Dyspnea, unspecified: Secondary | ICD-10-CM | POA: Diagnosis not present

## 2015-10-01 DIAGNOSIS — E1151 Type 2 diabetes mellitus with diabetic peripheral angiopathy without gangrene: Secondary | ICD-10-CM | POA: Diagnosis present

## 2015-10-01 DIAGNOSIS — Z7901 Long term (current) use of anticoagulants: Secondary | ICD-10-CM | POA: Diagnosis not present

## 2015-10-01 DIAGNOSIS — N179 Acute kidney failure, unspecified: Secondary | ICD-10-CM | POA: Diagnosis not present

## 2015-10-01 DIAGNOSIS — E118 Type 2 diabetes mellitus with unspecified complications: Secondary | ICD-10-CM | POA: Diagnosis not present

## 2015-10-01 DIAGNOSIS — Z794 Long term (current) use of insulin: Secondary | ICD-10-CM | POA: Diagnosis not present

## 2015-10-01 DIAGNOSIS — I480 Paroxysmal atrial fibrillation: Secondary | ICD-10-CM | POA: Diagnosis not present

## 2015-10-01 DIAGNOSIS — R0602 Shortness of breath: Secondary | ICD-10-CM | POA: Diagnosis present

## 2015-10-01 DIAGNOSIS — F329 Major depressive disorder, single episode, unspecified: Secondary | ICD-10-CM | POA: Diagnosis not present

## 2015-10-01 DIAGNOSIS — I4510 Unspecified right bundle-branch block: Secondary | ICD-10-CM | POA: Diagnosis not present

## 2015-10-01 DIAGNOSIS — G894 Chronic pain syndrome: Secondary | ICD-10-CM | POA: Diagnosis not present

## 2015-10-01 DIAGNOSIS — Z7982 Long term (current) use of aspirin: Secondary | ICD-10-CM | POA: Diagnosis not present

## 2015-10-01 DIAGNOSIS — E785 Hyperlipidemia, unspecified: Secondary | ICD-10-CM | POA: Diagnosis present

## 2015-10-01 DIAGNOSIS — G4730 Sleep apnea, unspecified: Secondary | ICD-10-CM | POA: Diagnosis present

## 2015-10-01 DIAGNOSIS — E872 Acidosis: Secondary | ICD-10-CM | POA: Diagnosis present

## 2015-10-01 DIAGNOSIS — I2510 Atherosclerotic heart disease of native coronary artery without angina pectoris: Secondary | ICD-10-CM | POA: Diagnosis not present

## 2015-10-01 DIAGNOSIS — R079 Chest pain, unspecified: Secondary | ICD-10-CM | POA: Diagnosis not present

## 2015-10-01 DIAGNOSIS — E1140 Type 2 diabetes mellitus with diabetic neuropathy, unspecified: Secondary | ICD-10-CM | POA: Diagnosis present

## 2015-10-01 DIAGNOSIS — E1165 Type 2 diabetes mellitus with hyperglycemia: Secondary | ICD-10-CM | POA: Diagnosis present

## 2015-10-01 DIAGNOSIS — J440 Chronic obstructive pulmonary disease with acute lower respiratory infection: Secondary | ICD-10-CM | POA: Diagnosis present

## 2015-10-01 DIAGNOSIS — R5381 Other malaise: Secondary | ICD-10-CM | POA: Diagnosis not present

## 2015-10-01 DIAGNOSIS — I351 Nonrheumatic aortic (valve) insufficiency: Secondary | ICD-10-CM | POA: Diagnosis not present

## 2015-10-01 DIAGNOSIS — J9600 Acute respiratory failure, unspecified whether with hypoxia or hypercapnia: Secondary | ICD-10-CM | POA: Diagnosis present

## 2015-10-01 DIAGNOSIS — R7989 Other specified abnormal findings of blood chemistry: Secondary | ICD-10-CM | POA: Diagnosis present

## 2015-10-01 DIAGNOSIS — N170 Acute kidney failure with tubular necrosis: Secondary | ICD-10-CM | POA: Diagnosis not present

## 2015-10-01 DIAGNOSIS — I5033 Acute on chronic diastolic (congestive) heart failure: Secondary | ICD-10-CM | POA: Diagnosis present

## 2015-10-01 DIAGNOSIS — I82409 Acute embolism and thrombosis of unspecified deep veins of unspecified lower extremity: Secondary | ICD-10-CM | POA: Diagnosis present

## 2015-10-01 DIAGNOSIS — J810 Acute pulmonary edema: Secondary | ICD-10-CM | POA: Diagnosis not present

## 2015-10-01 DIAGNOSIS — D649 Anemia, unspecified: Secondary | ICD-10-CM | POA: Diagnosis present

## 2015-10-01 DIAGNOSIS — Z951 Presence of aortocoronary bypass graft: Secondary | ICD-10-CM | POA: Diagnosis not present

## 2015-10-01 DIAGNOSIS — I160 Hypertensive urgency: Secondary | ICD-10-CM | POA: Diagnosis present

## 2015-10-01 DIAGNOSIS — Z6838 Body mass index (BMI) 38.0-38.9, adult: Secondary | ICD-10-CM | POA: Diagnosis not present

## 2015-10-01 DIAGNOSIS — E669 Obesity, unspecified: Secondary | ICD-10-CM | POA: Diagnosis present

## 2015-10-01 DIAGNOSIS — R931 Abnormal findings on diagnostic imaging of heart and coronary circulation: Secondary | ICD-10-CM | POA: Diagnosis not present

## 2015-10-01 DIAGNOSIS — I00 Rheumatic fever without heart involvement: Secondary | ICD-10-CM | POA: Diagnosis present

## 2015-10-01 DIAGNOSIS — N189 Chronic kidney disease, unspecified: Secondary | ICD-10-CM | POA: Diagnosis not present

## 2015-10-01 DIAGNOSIS — I161 Hypertensive emergency: Secondary | ICD-10-CM | POA: Diagnosis present

## 2015-10-01 DIAGNOSIS — E1122 Type 2 diabetes mellitus with diabetic chronic kidney disease: Secondary | ICD-10-CM | POA: Diagnosis present

## 2015-10-01 DIAGNOSIS — Z7984 Long term (current) use of oral hypoglycemic drugs: Secondary | ICD-10-CM | POA: Diagnosis not present

## 2015-10-01 DIAGNOSIS — J9601 Acute respiratory failure with hypoxia: Secondary | ICD-10-CM | POA: Diagnosis present

## 2015-10-01 DIAGNOSIS — R0902 Hypoxemia: Secondary | ICD-10-CM | POA: Diagnosis present

## 2015-10-01 DIAGNOSIS — F418 Other specified anxiety disorders: Secondary | ICD-10-CM | POA: Diagnosis present

## 2015-10-01 DIAGNOSIS — I10 Essential (primary) hypertension: Secondary | ICD-10-CM

## 2015-10-01 DIAGNOSIS — R269 Unspecified abnormalities of gait and mobility: Secondary | ICD-10-CM | POA: Diagnosis not present

## 2015-10-01 DIAGNOSIS — I130 Hypertensive heart and chronic kidney disease with heart failure and stage 1 through stage 4 chronic kidney disease, or unspecified chronic kidney disease: Secondary | ICD-10-CM | POA: Diagnosis present

## 2015-10-01 DIAGNOSIS — N184 Chronic kidney disease, stage 4 (severe): Secondary | ICD-10-CM | POA: Diagnosis present

## 2015-10-01 DIAGNOSIS — G4733 Obstructive sleep apnea (adult) (pediatric): Secondary | ICD-10-CM | POA: Diagnosis present

## 2015-10-01 DIAGNOSIS — I214 Non-ST elevation (NSTEMI) myocardial infarction: Secondary | ICD-10-CM | POA: Diagnosis present

## 2015-10-01 DIAGNOSIS — I4891 Unspecified atrial fibrillation: Secondary | ICD-10-CM | POA: Diagnosis not present

## 2015-10-01 LAB — POCT I-STAT 3, ART BLOOD GAS (G3+)
ACID-BASE DEFICIT: 2 mmol/L (ref 0.0–2.0)
Bicarbonate: 18.4 mEq/L — ABNORMAL LOW (ref 20.0–24.0)
O2 Saturation: 100 %
PCO2 ART: 17.6 mmHg — AB (ref 35.0–45.0)
Patient temperature: 98.7
TCO2: 19 mmol/L (ref 0–100)
pH, Arterial: 7.628 (ref 7.350–7.450)
pO2, Arterial: 138 mmHg — ABNORMAL HIGH (ref 80.0–100.0)

## 2015-10-01 LAB — BASIC METABOLIC PANEL
Anion gap: 11 (ref 5–15)
BUN: 38 mg/dL — ABNORMAL HIGH (ref 6–20)
CHLORIDE: 107 mmol/L (ref 101–111)
CO2: 19 mmol/L — ABNORMAL LOW (ref 22–32)
CREATININE: 2.18 mg/dL — AB (ref 0.44–1.00)
Calcium: 9 mg/dL (ref 8.9–10.3)
GFR calc non Af Amer: 22 mL/min — ABNORMAL LOW (ref >60)
GFR, EST AFRICAN AMERICAN: 25 mL/min — AB (ref >60)
Glucose, Bld: 242 mg/dL — ABNORMAL HIGH (ref 65–99)
POTASSIUM: 4.7 mmol/L (ref 3.5–5.1)
Sodium: 137 mmol/L (ref 135–145)

## 2015-10-01 LAB — PROCALCITONIN: Procalcitonin: 0.18 ng/mL

## 2015-10-01 LAB — TROPONIN I
TROPONIN I: 1.15 ng/mL — AB (ref <0.031)
Troponin I: 2.13 ng/mL (ref <0.031)
Troponin I: 1.55 ng/mL (ref <0.031)

## 2015-10-01 LAB — MRSA PCR SCREENING: MRSA BY PCR: NEGATIVE

## 2015-10-01 LAB — CBC
HEMATOCRIT: 32.9 % — AB (ref 36.0–46.0)
HEMOGLOBIN: 10.1 g/dL — AB (ref 12.0–15.0)
MCH: 26.6 pg (ref 26.0–34.0)
MCHC: 30.7 g/dL (ref 30.0–36.0)
MCV: 86.8 fL (ref 78.0–100.0)
Platelets: 183 10*3/uL (ref 150–400)
RBC: 3.79 MIL/uL — ABNORMAL LOW (ref 3.87–5.11)
RDW: 15 % (ref 11.5–15.5)
WBC: 12.3 10*3/uL — ABNORMAL HIGH (ref 4.0–10.5)

## 2015-10-01 LAB — HEPARIN LEVEL (UNFRACTIONATED)
Heparin Unfractionated: 0.39 IU/mL (ref 0.30–0.70)
Heparin Unfractionated: 0.31 IU/mL (ref 0.30–0.70)

## 2015-10-01 LAB — D-DIMER, QUANTITATIVE: D-Dimer, Quant: 1.71 ug/mL-FEU — ABNORMAL HIGH (ref 0.00–0.50)

## 2015-10-01 LAB — PHOSPHORUS: PHOSPHORUS: 4 mg/dL (ref 2.5–4.6)

## 2015-10-01 LAB — GLUCOSE, CAPILLARY: GLUCOSE-CAPILLARY: 263 mg/dL — AB (ref 65–99)

## 2015-10-01 LAB — MAGNESIUM: Magnesium: 2.1 mg/dL (ref 1.7–2.4)

## 2015-10-01 LAB — LACTIC ACID, PLASMA: Lactic Acid, Venous: 0.8 mmol/L (ref 0.5–2.0)

## 2015-10-01 MED ORDER — NITROGLYCERIN 0.4 MG SL SUBL
0.4000 mg | SUBLINGUAL_TABLET | SUBLINGUAL | Status: DC | PRN
  Administered 2015-10-02 – 2015-10-04 (×6): 0.4 mg via SUBLINGUAL
  Filled 2015-10-01 (×3): qty 1

## 2015-10-01 MED ORDER — METOPROLOL TARTRATE 25 MG PO TABS
25.0000 mg | ORAL_TABLET | Freq: Two times a day (BID) | ORAL | Status: DC
  Filled 2015-10-01: qty 1

## 2015-10-01 MED ORDER — INSULIN ASPART PROT & ASPART (70-30 MIX) 100 UNIT/ML ~~LOC~~ SUSP
15.0000 [IU] | Freq: Two times a day (BID) | SUBCUTANEOUS | Status: DC
  Administered 2015-10-01 – 2015-10-18 (×31): 15 [IU] via SUBCUTANEOUS
  Filled 2015-10-01: qty 10

## 2015-10-01 MED ORDER — HYDRALAZINE HCL 20 MG/ML IJ SOLN
INTRAMUSCULAR | Status: AC
  Filled 2015-10-01: qty 1

## 2015-10-01 MED ORDER — METOPROLOL TARTRATE 5 MG/5ML IV SOLN
INTRAVENOUS | Status: AC
  Administered 2015-10-01: 2.5 mg via INTRAVENOUS
  Filled 2015-10-01: qty 5

## 2015-10-01 MED ORDER — ASPIRIN 300 MG RE SUPP: 300.0000 mg | RECTAL | Status: AC

## 2015-10-01 MED ORDER — METOPROLOL TARTRATE 5 MG/5ML IV SOLN
2.5000 mg | Freq: Once | INTRAVENOUS | Status: AC
  Administered 2015-10-01: 2.5 mg via INTRAVENOUS

## 2015-10-01 MED ORDER — HEPARIN BOLUS VIA INFUSION
4000.0000 [IU] | Freq: Once | INTRAVENOUS | Status: AC
  Administered 2015-10-01: 4000 [IU] via INTRAVENOUS
  Filled 2015-10-01: qty 4000

## 2015-10-01 MED ORDER — NITROGLYCERIN 0.4 MG SL SUBL
SUBLINGUAL_TABLET | SUBLINGUAL | Status: AC
  Administered 2015-10-01: 0.4 mg
  Filled 2015-10-01: qty 1

## 2015-10-01 MED ORDER — HYDRALAZINE HCL 20 MG/ML IJ SOLN
10.0000 mg | Freq: Once | INTRAMUSCULAR | Status: AC
  Administered 2015-10-01: 10 mg via INTRAVENOUS

## 2015-10-01 MED ORDER — LABETALOL HCL 5 MG/ML IV SOLN
0.5000 mg/min | INTRAVENOUS | Status: DC
  Administered 2015-10-01: 0.5 mg/min via INTRAVENOUS
  Filled 2015-10-01: qty 100

## 2015-10-01 MED ORDER — SODIUM CHLORIDE 0.9 % IV SOLN: 250.0000 mL | INTRAVENOUS | Status: DC | PRN

## 2015-10-01 MED ORDER — METOPROLOL TARTRATE 25 MG PO TABS
25.0000 mg | ORAL_TABLET | Freq: Two times a day (BID) | ORAL | Status: DC
  Administered 2015-10-01 – 2015-10-04 (×6): 25 mg via ORAL
  Filled 2015-10-01 (×7): qty 1

## 2015-10-01 MED ORDER — HYDRALAZINE HCL 50 MG PO TABS
100.0000 mg | ORAL_TABLET | Freq: Three times a day (TID) | ORAL | Status: DC
  Administered 2015-10-01 – 2015-10-06 (×15): 100 mg via ORAL
  Filled 2015-10-01 (×16): qty 2

## 2015-10-01 MED ORDER — ATORVASTATIN CALCIUM 80 MG PO TABS
80.0000 mg | ORAL_TABLET | Freq: Every evening | ORAL | Status: DC
  Administered 2015-10-01 – 2015-10-17 (×16): 80 mg via ORAL
  Filled 2015-10-01 (×18): qty 1

## 2015-10-01 MED ORDER — SERTRALINE HCL 100 MG PO TABS
200.0000 mg | ORAL_TABLET | Freq: Every day | ORAL | Status: DC
  Administered 2015-10-01 – 2015-10-02 (×2): 100 mg via ORAL
  Filled 2015-10-01 (×2): qty 2

## 2015-10-01 MED ORDER — HEPARIN (PORCINE) IN NACL 100-0.45 UNIT/ML-% IJ SOLN
1500.0000 [IU]/h | INTRAMUSCULAR | Status: DC
Start: 2015-10-01 — End: 2015-10-02
  Administered 2015-10-01 (×2): 1400 [IU]/h via INTRAVENOUS
  Administered 2015-10-02: 1500 [IU]/h via INTRAVENOUS
  Filled 2015-10-01 (×5): qty 250

## 2015-10-01 MED ORDER — ASPIRIN 81 MG PO CHEW
324.0000 mg | CHEWABLE_TABLET | ORAL | Status: AC
  Administered 2015-10-01: 324 mg via ORAL
  Filled 2015-10-01: qty 4

## 2015-10-01 MED ORDER — FUROSEMIDE 10 MG/ML IJ SOLN
40.0000 mg | Freq: Every day | INTRAMUSCULAR | Status: DC
  Administered 2015-10-01 – 2015-10-04 (×4): 40 mg via INTRAVENOUS
  Filled 2015-10-01 (×7): qty 4

## 2015-10-01 NOTE — Progress Notes (Signed)
*  Preliminary Results* Bilateral lower extremity venous duplex completed. Visualized veins of bilateral lower extremities are negative for deep vein thrombosis. There is no evidence of Baker's cyst bilaterally.  10/01/2015  Maudry Mayhew, RVT, RDCS, RDMS

## 2015-10-01 NOTE — Progress Notes (Signed)
Pt with run of wide complex tachycardia and 4/10 chest discomfort, elink md notified, repeat ekg and lopressor ivp order by md, will hold po lopressor for now per md. md states current orders vq scan and dopplers do not need to be done stat at this time

## 2015-10-01 NOTE — Consult Note (Signed)
Cardiology Consult Note Briana Fairy, PA-C Referring provider: Dr. Chase Caller Reason for consult:  Chest pain and abnormal EKG History of Present Illness (and review of medical records): Briana [name] is a 70 y.o. female with known hx of CAD s/p CABG on 05/23/2004 with LIMA to LAD, SVG to OM 2, and SVG to distal RCA, coronary artery disease, carotid artery stenosis, hypertensive heart disease with severe LVH, and chronic diastolic heart failure.  She presented to [facility] with shortness of breath and chest pain x 2 days.  She stated pain was sharp, right sided, rated 10/10 at its worse and was constant all day yesterday.  She also had non-productive cough.  In the ED, patient was hypoxic, febrile with leukocytosis.  Her initial troponin was 0.12 and BNP was 849.  She was placed on BiPAP and started on empiric antibiotics for PNA.  She was transferred to [facility] for further evaluation and management.  Patient has been off BiPAP on NRB here.  She however complained of chest pain returning.  This was relieved with 1 SL NTG.  Cardiology was consulted for assistance with management.  At the time of my evaluation, patient was chest pain free.  She had mild shortness of breath and had tachypnea on NRB. Her blood pressure remains elevated.  Review of Systems Further review of systems was negative other than stated in HPI.  Echo 10/2014: Study Conclusions  - Left ventricle: The cavity size was normal. Wall thickness was  increased in a pattern of severe LVH. Systolic function was  vigorous. The estimated ejection fraction was in the range of 65%  to 70%. Wall motion was normal; there were no regional wall  motion abnormalities. Features are consistent with a pseudonormal  left ventricular filling pattern, with concomitant abnormal  relaxation and increased filling pressure (grade 2 diastolic  dysfunction). - Aortic valve: Severely calcified annulus. Trileaflet; mildly  calcified leaflets.  Transvalvular velocity was mildly increased,  likely combination of sclerotic valve and vigorous LV  contraction. No definite stenosis. There was mild regurgitation.  Mean gradient (S): 13 mm Hg. Valve area (VTI): 2.13 cm^2. - Mitral valve: Severely calcified annulus. There was mild  regurgitation. Valve area by continuity equation (using LVOT  flow): 2.05 cm^2. - Left atrium: The atrium was moderately dilated. - Right atrium: The atrium was mildly dilated. Central venous  pressure (est): 3 mm Hg. - Atrial septum: No defect or patent foramen ovale was identified. - Tricuspid valve: There was trivial regurgitation. - Pulmonary arteries: Systolic pressure was moderately to severely  increased. PA peak pressure: 56 mm Hg (S). - Pericardium, extracardiac: There was no pericardial effusion  Patient Active Problem List   Diagnosis Date Noted  . Acute respiratory failure ([place]) 10/01/2015  . Hypoxia 10/01/2015  . Acute on chronic diastolic CHF (congestive heart failure) ([place]) 05/31/2015  . Essential hypertension, benign 03/28/2015  . Hyperlipidemia 03/28/2015  . Vitamin D deficiency 03/28/2015  . CKD (chronic kidney disease) stage 3, GFR 30-59 ml/min 03/04/2014  . Acute renal failure ([place]) 03/04/2014  . UTI (lower urinary tract infection) 03/02/2014  . Acute encephalopathy 03/02/2014  . Dehydration 03/02/2014  . Generalized weakness 03/02/2014  . Morbid obesity ([place]) 03/02/2014  . Depressed mood 03/02/2014  . Type 2 diabetes mellitus with stage 3 chronic kidney disease ([place]) 03/02/2014  . Hx of adenomatous colonic polyps 01/28/2012  . Occlusion and stenosis of carotid artery without mention of cerebral infarction 12/03/2011   Past Medical History  Diagnosis Date  .  Diabetes mellitus   . Hypertension   . Hyperlipidemia   . Heart murmur   . Peripheral vascular disease ([place])   . Arthritis   . Depression with anxiety   . Carotid artery occlusion   . Chronic kidney disease    . Neuropathy ([place])   . CAD (coronary artery disease)   . Rheumatic fever   . Sleep apnea   . PAD (peripheral artery disease) ([place])   . Adenomatous colon polyp 09/2007    Past Surgical History  Procedure Laterality Date  . Coronary artery bypass graft  2005  . Carotid endarterectomy  10/04/2009    Right CEA  . Tubal ligation    . Polypectomy  02/25/2012    Procedure: POLYPECTOMY;  Surgeon: Danie Binder, MD;  Location: [facility];  Service: Endoscopy;  Laterality: N/A;    Prescriptions prior to admission  Medication Sig Dispense Refill Last Dose  . aspirin EC 325 MG tablet Take 325 mg by mouth daily.   Taking  . atorvastatin (LIPITOR) 80 MG tablet Take 80 mg by mouth every evening.    Taking  . cholecalciferol (VITAMIN D) 1000 UNITS tablet Take 1,000 Units by mouth 2 (two) times daily.    Taking  . diazepam (VALIUM) 5 MG tablet Take 5 mg by mouth at bedtime. *Will take one tablet daily as needed for anxiety   Taking  . furosemide (LASIX) 40 MG tablet Take 40 mg by mouth daily.    Taking  . hydrALAZINE (APRESOLINE) 100 MG tablet Take 1 tablet (100 mg total) by mouth 3 (three) times daily. 90 tablet 3 Taking  . HYDROcodone-acetaminophen (NORCO) 7.5-325 MG tablet Take 1 tablet by mouth every 4 (four) hours as needed for moderate pain (Must last 30 days.  Do not drive or operate machinery while taking this medicine.). 120 tablet 0 Taking  . insulin NPH-regular Human (NOVOLIN 70/30) (70-30) 100 UNIT/ML injection Inject 30 Units into the skin 2 (two) times daily with a meal. 10 mL 2 Taking  . JANUVIA 25 MG tablet Take 1 tablet (25 mg total) by mouth daily. 30 tablet 2 Taking  . metoprolol tartrate (LOPRESSOR) 25 MG tablet Take 1 tablet (25 mg total) by mouth 2 (two) times daily. 180 tablet 3 Taking  . potassium chloride SA (K-DUR,KLOR-CON) 20 MEQ tablet Take 20 mEq by mouth daily.    Taking  . sertraline (ZOLOFT) 100 MG tablet Take 200 mg by mouth daily.    Taking  . tiZANidine (ZANAFLEX) 4 MG  capsule Take 4 mg by mouth 3 (three) times daily as needed for muscle spasms (*Takes at bedtime*).    Taking   Allergies  Allergen Reactions  . Carbamazepine Other (See Comments)    Unknown reaction-patient is not aware of this allergy  . Clonazepam Other (See Comments)    Patient is not aware of this allergy  . Gemfibrozil Other (See Comments)    Patient is not aware of this allergy but states that she has side effects to a cholesterol medication in the past  . Macrobid [Nitrofurantoin] Nausea And Vomiting  . Oxycodone Other (See Comments)    hallucinations    Social History  Substance Use Topics  . Smoking status: Never Smoker   . Smokeless tobacco: Never Used  . Alcohol Use: No    Family History  Problem Relation Age of Onset  . Breast cancer Mother   . Heart disease Father   . Diabetes Father   . Heart disease Brother  Objective:  Patient Vitals for the past 8 hrs:  BP Temp Temp src Pulse Resp SpO2 Height Weight  10/01/15 0510 (!) 164/77 mmHg - - (!) 117 (!) 26 95 % - -  10/01/15 0500 (!) 193/57 mmHg - - 72 (!) 36 92 % - -  10/01/15 0456 (!) 192/88 mmHg - - (!) 111 (!) 31 96 % - -  10/01/15 0420 (!) 192/65 mmHg - - 78 (!) 24 90 % - -  10/01/15 0300 (!) 175/53 mmHg - - 64 (!) 23 97 % '5\' 5"'  (1.651 m) 103.7 kg (228 lb 9.9 oz)  10/01/15 0215 - 99.4 F (37.4 C) Oral - - - - -  10/01/15 0045 154/59 mmHg - - 60 25 99 % - -  10/01/15 0015 (!) 150/52 mmHg - - 64 17 99 % - -  10/01/15 0000 (!) 143/52 mmHg - - 63 22 99 % - -  09/30/15 2345 156/55 mmHg - - 65 (!) 28 96 % - -  09/30/15 2330 156/57 mmHg - - 69 (!) 29 99 % - -  09/30/15 2315 154/59 mmHg - - 67 (!) 28 98 % - -  09/30/15 2300 175/61 mmHg - - 70 23 100 % - -  09/30/15 2245 191/61 mmHg - - 72 (!) 27 97 % - -  09/30/15 2231 195/78 mmHg - - 77 26 97 % - -  09/30/15 2230 195/78 mmHg - - 77 26 100 % - -  09/30/15 2200 (!) 203/60 mmHg - - 79 24 (!) 88 % - -  09/30/15 2154 - - - - - 94 % - -  09/30/15 2149 - - - - -  (!) 86 % - -  09/30/15 2149 - 101.6 F (38.7 C) Rectal - - - - -   General appearance: obese female, comfortable on NRB in ICU,  Head: Normocephalic, without obvious abnormality, atraumatic Eyes: PERRL, EOM's intact. Fundi benign. Neck: Right carotid bruit Lungs: mildly labored breathing, no wheezing Chest wall: no tenderness Heart: regular rate and rhythm,  Abdomen: soft, non-tender; bowel sounds normal Extremities: extremities normal, atraumatic, no edema Pulses: 2+ and symmetric Neurologic: Grossly normal  Results for orders placed or performed during the hospital encounter of 09/30/15 (from the past 48 hour(s))  Comprehensive metabolic panel     Status: Abnormal   Collection Time: 09/30/15  9:40 PM  Result Value Ref Range   Sodium 136 135 - 145 mmol/L   Potassium 4.4 3.5 - 5.1 mmol/L   Chloride 105 101 - 111 mmol/L   CO2 20 (L) 22 - 32 mmol/L   Glucose, Bld 302 (H) 65 - 99 mg/dL   BUN 42 (H) 6 - 20 mg/dL   Creatinine, Ser 2.17 (H) 0.44 - 1.00 mg/dL   Calcium 9.0 8.9 - 10.3 mg/dL   Total Protein 7.4 6.5 - 8.1 g/dL   Albumin 3.6 3.5 - 5.0 g/dL   AST 16 15 - 41 U/L   ALT 11 (L) 14 - 54 U/L   Alkaline Phosphatase 72 38 - 126 U/L   Total Bilirubin 0.7 0.3 - 1.2 mg/dL   GFR calc non Af Amer 22 (L) >60 mL/min   GFR calc Af Amer 25 (L) >60 mL/min    Comment: (NOTE) The eGFR has been calculated using the CKD EPI equation. This calculation has not been validated in all clinical situations. eGFR's persistently <60 mL/min signify possible Chronic Kidney Disease.    Anion gap 11 5 - 15  CBC WITH DIFFERENTIAL     Status: Abnormal   Collection Time: 09/30/15  9:40 PM  Result Value Ref Range   WBC 15.5 (H) 4.0 - 10.5 K/uL   RBC 3.83 (L) 3.87 - 5.11 MIL/uL   Hemoglobin 10.6 (L) 12.0 - 15.0 g/dL   HCT 33.5 (L) 36.0 - 46.0 %   MCV 87.5 78.0 - 100.0 fL   MCH 27.7 26.0 - 34.0 pg   MCHC 31.6 30.0 - 36.0 g/dL   RDW 15.1 11.5 - 15.5 %   Platelets 215 150 - 400 K/uL   Neutrophils  Relative % 76 %   Neutro Abs 11.7 (H) 1.7 - 7.7 K/uL   Lymphocytes Relative 15 %   Lymphs Abs 2.2 0.7 - 4.0 K/uL   Monocytes Relative 7 %   Monocytes Absolute 1.2 (H) 0.1 - 1.0 K/uL   Eosinophils Relative 2 %   Eosinophils Absolute 0.3 0.0 - 0.7 K/uL   Basophils Relative 0 %   Basophils Absolute 0.1 0.0 - 0.1 K/uL  Troponin I     Status: Abnormal   Collection Time: 09/30/15  9:40 PM  Result Value Ref Range   Troponin I 0.12 (H) <0.031 ng/mL    Comment:        PERSISTENTLY INCREASED TROPONIN VALUES IN THE RANGE OF 0.04-0.49 ng/mL CAN BE SEEN IN:       -UNSTABLE ANGINA       -CONGESTIVE HEART FAILURE       -MYOCARDITIS       -CHEST TRAUMA       -ARRYHTHMIAS       -LATE PRESENTING MYOCARDIAL INFARCTION       -COPD   CLINICAL FOLLOW-UP RECOMMENDED.   Brain natriuretic peptide     Status: Abnormal   Collection Time: 09/30/15  9:40 PM  Result Value Ref Range   B Natriuretic Peptide 894.0 (H) 0.0 - 100.0 pg/mL  I-Stat CG4 Lactic Acid, ED  (not at  [facility])     Status: None   Collection Time: 09/30/15 10:10 PM  Result Value Ref Range   Lactic Acid, Venous 1.68 0.5 - 2.0 mmol/L  Blood Culture (routine x 2)     Status: None (Preliminary result)   Collection Time: 09/30/15 10:14 PM  Result Value Ref Range   Specimen Description BLOOD LEFT ARM    Special Requests      BOTTLES DRAWN AEROBIC AND ANAEROBIC 6CC DRAWN BY RN   Culture PENDING    Report Status PENDING   Blood Culture (routine x 2)     Status: None (Preliminary result)   Collection Time: 09/30/15 10:19 PM  Result Value Ref Range   Specimen Description BLOOD RIGHT HAND    Special Requests BOTTLES DRAWN AEROBIC ONLY 6CC    Culture PENDING    Report Status PENDING   Urinalysis, Routine w reflex microscopic (not at [facility])     Status: Abnormal   Collection Time: 09/30/15 10:35 PM  Result Value Ref Range   Color, Urine YELLOW YELLOW   APPearance HAZY (A) CLEAR   Specific Gravity, Urine 1.020 1.005 - 1.030   pH 5.5 5.0 - 8.0    Glucose, UA 250 (A) NEGATIVE mg/dL   Hgb urine dipstick NEGATIVE NEGATIVE   Bilirubin Urine NEGATIVE NEGATIVE   Ketones, ur NEGATIVE NEGATIVE mg/dL   Protein, ur >300 (A) NEGATIVE mg/dL   Nitrite NEGATIVE NEGATIVE   Leukocytes, UA NEGATIVE NEGATIVE  Urine microscopic-add on     Status: Abnormal  Collection Time: 09/30/15 10:35 PM  Result Value Ref Range   Squamous Epithelial / LPF 0-5 (A) NONE SEEN   WBC, UA 0-5 0 - 5 WBC/hpf   RBC / HPF 0-5 0 - 5 RBC/hpf   Bacteria, UA FEW (A) NONE SEEN   Urine-Other AMORPHOUS URATES/PHOSPHATES   Blood gas, arterial     Status: Abnormal   Collection Time: 09/30/15 10:45 PM  Result Value Ref Range   FIO2 0.70    Delivery systems BILEVEL POSITIVE AIRWAY PRESSURE    LHR 10 resp/min   Inspiratory PAP 12.0    Expiratory PAP 6.0    pH, Arterial 7.27 (L) 7.350 - 7.450   pCO2 arterial 44.9 35.0 - 45.0 mmHg   pO2, Arterial 48.6 (L) 80.0 - 100.0 mmHg   Bicarbonate 19.4 (L) 20.0 - 24.0 mEq/L   TCO2 44.9 0 - 100 mmol/L   Acid-Base Excess 5.5 (H) 0.0 - 2.0 mmol/L   O2 Saturation 77.2 %   Patient temperature 101.6    Collection site RIGHT RADIAL    Drawn by [phone]    Sample type ARTERIAL DRAW    Allens test (pass/fail) PASS PASS  Glucose, capillary     Status: Abnormal   Collection Time: 10/01/15  2:44 AM  Result Value Ref Range   Glucose-Capillary 263 (H) 65 - 99 mg/dL   Comment 1 Notify RN   CBC     Status: Abnormal   Collection Time: 10/01/15  4:13 AM  Result Value Ref Range   WBC 12.3 (H) 4.0 - 10.5 K/uL   RBC 3.79 (L) 3.87 - 5.11 MIL/uL   Hemoglobin 10.1 (L) 12.0 - 15.0 g/dL   HCT 32.9 (L) 36.0 - 46.0 %   MCV 86.8 78.0 - 100.0 fL   MCH 26.6 26.0 - 34.0 pg   MCHC 30.7 30.0 - 36.0 g/dL   RDW 15.0 11.5 - 15.5 %   Platelets 183 150 - 400 K/uL  Basic metabolic panel     Status: Abnormal   Collection Time: 10/01/15  4:13 AM  Result Value Ref Range   Sodium 137 135 - 145 mmol/L   Potassium 4.7 3.5 - 5.1 mmol/L   Chloride 107 101 - 111  mmol/L   CO2 19 (L) 22 - 32 mmol/L   Glucose, Bld 242 (H) 65 - 99 mg/dL   BUN 38 (H) 6 - 20 mg/dL   Creatinine, Ser 2.18 (H) 0.44 - 1.00 mg/dL   Calcium 9.0 8.9 - 10.3 mg/dL   GFR calc non Af Amer 22 (L) >60 mL/min   GFR calc Af Amer 25 (L) >60 mL/min    Comment: (NOTE) The eGFR has been calculated using the CKD EPI equation. This calculation has not been validated in all clinical situations. eGFR's persistently <60 mL/min signify possible Chronic Kidney Disease.    Anion gap 11 5 - 15  Magnesium     Status: None   Collection Time: 10/01/15  4:13 AM  Result Value Ref Range   Magnesium 2.1 1.7 - 2.4 mg/dL  Phosphorus     Status: None   Collection Time: 10/01/15  4:13 AM  Result Value Ref Range   Phosphorus 4.0 2.5 - 4.6 mg/dL  D-dimer, quantitative (not at [facility])     Status: Abnormal   Collection Time: 10/01/15  4:13 AM  Result Value Ref Range   D-Dimer, Quant 1.71 (H) 0.00 - 0.50 ug/mL-FEU    Comment: (NOTE) At the manufacturer cut-off of 0.50 ug/mL FEU, this  assay has been documented to exclude PE with a sensitivity and negative predictive value of 97 to 99%.  At this time, this assay has not been approved by the FDA to exclude DVT/VTE. Results should be correlated with clinical presentation.   I-STAT 3, arterial blood gas (G3+)     Status: Abnormal   Collection Time: 10/01/15  4:21 AM  Result Value Ref Range   pH, Arterial 7.628 (HH) 7.350 - 7.450   pCO2 arterial 17.6 (LL) 35.0 - 45.0 mmHg   pO2, Arterial 138.0 (H) 80.0 - 100.0 mmHg   Bicarbonate 18.4 (L) 20.0 - 24.0 mEq/L   TCO2 19 0 - 100 mmol/L   O2 Saturation 100.0 %   Acid-base deficit 2.0 0.0 - 2.0 mmol/L   Patient temperature 98.7 F    Collection site RADIAL, ALLEN'S TEST ACCEPTABLE    Drawn by Operator    Sample type ARTERIAL    Comment NOTIFIED PHYSICIAN    US Carotid Duplex Bilateral  09/29/2015  CLINICAL DATA:  Bilateral carotid artery stenosis. EXAM: BILATERAL CAROTID DUPLEX ULTRASOUND TECHNIQUE: Pearline Cables  scale imaging, color Doppler and duplex ultrasound were performed of bilateral carotid and vertebral arteries in the neck. COMPARISON:  11/24/2014. FINDINGS: Criteria: Quantification of carotid stenosis is based on velocity parameters that correlate the residual internal carotid diameter with NASCET-based stenosis levels, using the diameter of the distal internal carotid lumen as the denominator for stenosis measurement. The following velocity measurements were obtained: RIGHT ICA:  113/16 cm/sec CCA:  476/54 cm/sec SYSTOLIC ICA/CCA RATIO:  1.0 DIASTOLIC ICA/CCA RATIO:  1.5 ECA:  128 cm/sec LEFT ICA:  393/82 cm/sec CCA:  65/03 cm/sec SYSTOLIC ICA/CCA RATIO:  4.6 DIASTOLIC ICA/CCA RATIO:  5.8 ECA:  160 cm/sec RIGHT CAROTID ARTERY: Mild to moderate right carotid bifurcation atherosclerotic vascular disease. No flow limiting stenosis. Degree of stenosis less than 50%. RIGHT VERTEBRAL ARTERY:  Patent with antegrade flow. LEFT CAROTID ARTERY: Prominent left carotid bifurcation and proximal ICA atherosclerotic vascular plaque. Degree of stenosis greater than 70%. LEFT VERTEBRAL ARTERY:  Patent antegrade flow. IMPRESSION: 1. Greater than 70 degrees stenosis left carotid bifurcation and proximal ICA. Vascular surgery consultation suggested. 2. Atherosclerotic vascular plaque right carotid bifurcation. Degree of stenosis less than 50%. Vertebral arteries are patent antegrade flow. Electronically Signed   By: Marcello Moores  Register   On: 09/29/2015 13:15   Dg Chest Port 1 View  10/01/2015  CLINICAL DATA:  Initial evaluation for acute chest pain. EXAM: PORTABLE CHEST 1 VIEW COMPARISON:  Prior radiograph from 09/30/2015. FINDINGS: Median sternotomy wires with underlying CABG markers again noted. Cardiomegaly stable. Atheromatous plaque within the aortic arch. Lungs mildly hypoinflated with elevation left hemidiaphragm. Interval worsening and patchy right perihilar and basilar opacity, worrisome for progressive pneumonia. Additional  patchy opacity at the left lung base may reflect atelectasis or infiltrate. Mild vascular congestion without overt pulmonary edema. No definite pleural effusion, although the right costophrenic angle is incompletely visualized. No pneumothorax. No acute osseus abnormality. IMPRESSION: 1. Interval worsening in patchy right perihilar and basilar opacity, worrisome for progressive pneumonia. 2. Elevation of the left hemidiaphragm with additional patchy left basilar patchy opacity, which may reflect atelectasis or additional infiltrate. 3. Stable cardiomegaly with mild pulmonary vascular congestion without overt pulmonary edema. Electronically Signed   By: Jeannine Boga M.D.   On: 10/01/2015 05:15   Dg Chest Port 1 View  09/30/2015  CLINICAL DATA:  Dyspnea, cough, fever. EXAM: PORTABLE CHEST 1 VIEW COMPARISON:  05/31/2015 FINDINGS: There is unchanged moderate cardiomegaly.  There is patchy right base opacity which could represent pneumonia. No large effusion. IMPRESSION: Patchy right lung base opacity, potentially pneumonia although hemorrhage or asymmetric edema could also produce this appearance. Electronically Signed   By: Andreas Newport M.D.   On: 09/30/2015 22:33    ECG:  Sinus rhythm HR 80, RBBB, LVH with 2nd repol, PACS. No significant change from prior  Impression: Acute hypoxic respiratory failure on NRB Suspected PE Bilateral PNA, worsening on CXR Hypertensive urgency Elevated troponin, ACS vs Demand ischemia Acute on CKD Hx of CAD s/p CABG LIMA to LAD, SVG to OM 2, and SVG to distal RCA Chronic DHF Hyperlipidemia Left internal carotid artery stenosis and s/p right CEA  Recommendations: -Continue close monitoring in ICU -EKG prn with chest pain or rhythm changes -Trend cardiac enzymes -TTE this am evaluate LV function -V/Q scan for PE eval given renal insufficiency -Medical management with ASA, Heparin gtt, Statin, Nitro/Morphine prn -Consider Labetalol gtt for elevated  BPs -Aggressive supportive care for sepsis, respiratory failure, PNA as per primary team.   Thank you for this consult.  We will be happy to follow along with you.  Please call with questions.

## 2015-10-01 NOTE — Progress Notes (Signed)
ANTICOAGULATION CONSULT NOTE - Initial Consult  Pharmacy Consult for heparin Indication: pulmonary embolus  Allergies  Allergen Reactions  . Carbamazepine Other (See Comments)    Unknown reaction-patient is not aware of this allergy  . Clonazepam Other (See Comments)    Patient is not aware of this allergy  . Gemfibrozil Other (See Comments)    Patient is not aware of this allergy but states that she has side effects to a cholesterol medication in the past  . Macrobid [Nitrofurantoin] Nausea And Vomiting  . Oxycodone Other (See Comments)    hallucinations    Patient Measurements: Height: 5\' 5"  (165.1 cm) Weight: 230 lb (104.327 kg) IBW/kg (Calculated) : 57 Heparin Dosing Weight: 81  Vital Signs: Temp: 99.4 F (37.4 C) (05/07 0215) Temp Source: Oral (05/07 0215) BP: 154/59 mmHg (05/07 0045) Pulse Rate: 60 (05/07 0045)  Labs:  Recent Labs  09/30/15 2140  HGB 10.6*  HCT 33.5*  PLT 215  CREATININE 2.17*  TROPONINI 0.12*    Estimated Creatinine Clearance: 28.9 mL/min (by C-G formula based on Cr of 2.17).   Medical History: Past Medical History  Diagnosis Date  . Diabetes mellitus   . Hypertension   . Hyperlipidemia   . Heart murmur   . Peripheral vascular disease ([place])   . Arthritis   . Depression with anxiety   . Carotid artery occlusion   . Chronic kidney disease   . Neuropathy ([place])   . CAD (coronary artery disease)   . Rheumatic fever   . Sleep apnea   . PAD (peripheral artery disease) ([place])   . Adenomatous colon polyp 09/2007   Assessment: 70 yoF transferred from OSH with hypoxia, fever, CP and potential CAP. Pharmacy has been consulted to dose heparin to r/o PE. No known anticoagulation PTA and initial relatively stable.    Goal of Therapy:  Heparin level 0.3-0.7 units/ml Monitor platelets by anticoagulation protocol: Yes   Plan:  Give 4000 units bolus x 1 Start heparin infusion at 1400 units/hr Check anti-Xa level in 8 hours and daily while  on heparin Continue to monitor H&H and platelets  Duayne Cal 10/01/2015,2:56 AM

## 2015-10-01 NOTE — Progress Notes (Signed)
ANTICOAGULATION CONSULT NOTE - Follow Up Consult  Pharmacy Consult for Heparin Indication: pulmonary embolus  Allergies  Allergen Reactions  . Carbamazepine Other (See Comments)    Reaction to tegretol - loopy  . Clonazepam Other (See Comments)    Unknown reaction  . Gemfibrozil Other (See Comments)    Patient is not aware of this allergy but states that she has side effects to a cholesterol medication in the past  . Macrobid [Nitrofurantoin] Nausea And Vomiting  . Oxycodone Other (See Comments)    hallucinations    Patient Measurements: Height: 5\' 5"  (165.1 cm) Weight: 228 lb 9.9 oz (103.7 kg) IBW/kg (Calculated) : 57 Heparin Dosing Weight: 93 kg  Vital Signs: Temp: 99.9 F (37.7 C) (05/07 1132) Temp Source: Oral (05/07 1132) BP: 130/37 mmHg (05/07 1315) Pulse Rate: 82 (05/07 1330)  Labs:  Recent Labs  09/30/15 2140 10/01/15 0413 10/01/15 1455  HGB 10.6* 10.1*  --   HCT 33.5* 32.9*  --   PLT 215 183  --   HEPARINUNFRC  --   --  0.39  CREATININE 2.17* 2.18*  --   TROPONINI 0.12* 1.15*  --     Estimated Creatinine Clearance: 28.7 mL/min (by C-G formula based on Cr of 2.18).   Medications:  Heparin @ 1400 units/hr (14 ml/hr)  Assessment: 69 YOF transferred from OSH with hypoxia, fever, CP and potential CAP. Pharmacy has been consulted to dose heparin to r/o PE.  Heparin level this afternoon is therapeutic (HL 0.39, goal of 0.3-0.7). CBC stable - no overt s/sx of bleeding noted. Dopplers neg for DVT, still awaiting VQ scan.  Goal of Therapy:  Heparin level 0.3-0.7 units/ml Monitor platelets by anticoagulation protocol: Yes   Plan:  1. Continue Heparin at 1400 units/hr (14 ml/hr) 2. Will continue to monitor for any signs/symptoms of bleeding and will follow up with heparin level in 8 hours   Alycia Rossetti, PharmD, BCPS Clinical Pharmacist Pager: [phone] 10/01/2015 3:50 PM

## 2015-10-01 NOTE — Progress Notes (Signed)
PULMONARY / CRITICAL CARE MEDICINE   Name: Briana [name] MRN: [id number] DOB: [birth year]    ADMISSION DATE:  09/30/2015  CHIEF COMPLAINT:  Chest pain  HISTORY OF PRESENT ILLNESS:   Briana [name] is an 70 y.o. woman with a PMHx most notable for DM2 and cardiovascular disease (CAD, PVD, as well as diastolic HF) who presented to the ED at [facility] on the evening of 5/6 with 24 hours of chest pain. In ED found to have: hypoxia, a fever to 101.6, WBC of 15.5, EKG changes (lateral ST depression), mild troponin elevation (0.12). BNP was elevated to 894.0, previously was 391. She had a CXR which showed alveolar opacities, most dense in the R base. Cultures were obtained, she was started on CAP coverage, and due to the hypoxia was transferred to [facility] for tx of pneumonia.   SUBJECTIVE:  Feels much better   VITAL SIGNS: BP 169/55 mmHg  Pulse 69  Temp(Src) 99.9 F (37.7 C) (Oral)  Resp 27  Ht 5\' 5"  (1.651 m)  Wt 228 lb 9.9 oz (103.7 kg)  BMI 38.04 kg/m2  SpO2 97% 100% NRB HEMODYNAMICS:    VENTILATOR SETTINGS: Vent Mode:  [-]  FiO2 (%):  [70 %] 70 %  INTAKE / OUTPUT: I/O last 3 completed shifts: In: 106 [I.V.:106] Out: 250 [Urine:250]  PHYSICAL EXAMINATION: General:  Elderly woman with O2 mask on, awake, in NAD Neuro:  Grossly normal motor and cognitive function. HEENT:  MMM Cardiovascular:  Heart sounds dual and normal. Lungs:  Few course crackles; no accessory use  Abdomen:  Obese, soft Musculoskeletal:  No deformities Skin:  No rashes.  LABS:  BMET  Recent Labs Lab 09/30/15 2140 10/01/15 0413  NA 136 137  K 4.4 4.7  CL 105 107  CO2 20* 19*  BUN 42* 38*  CREATININE 2.17* 2.18*  GLUCOSE 302* 242*    Electrolytes  Recent Labs Lab 09/30/15 2140 10/01/15 0413  CALCIUM 9.0 9.0  MG  --  2.1  PHOS  --  4.0    CBC  Recent Labs Lab 09/30/15 2140 10/01/15 0413  WBC 15.5* 12.3*  HGB 10.6* 10.1*  HCT 33.5* 32.9*  PLT 215 183    Coag's No results  for input(s): APTT, INR in the last 168 hours.  Sepsis Markers  Recent Labs Lab 09/30/15 2210 10/01/15 0413 10/01/15 0815  LATICACIDVEN 1.68  --  0.8  PROCALCITON  --  0.18  --     ABG  Recent Labs Lab 09/30/15 2245 10/01/15 0421  PHART 7.27* 7.628*  PCO2ART 44.9 17.6*  PO2ART 48.6* 138.0*    Liver Enzymes  Recent Labs Lab 09/30/15 2140  AST 16  ALT 11*  ALKPHOS 72  BILITOT 0.7  ALBUMIN 3.6    Cardiac Enzymes  Recent Labs Lab 09/30/15 2140 10/01/15 0413  TROPONINI 0.12* 1.15*    Glucose  Recent Labs Lab 10/01/15 0244  GLUCAP 263*    Imaging Dg Chest Port 1 View  10/01/2015  CLINICAL DATA:  Initial evaluation for acute chest pain. EXAM: PORTABLE CHEST 1 VIEW COMPARISON:  Prior radiograph from 09/30/2015. FINDINGS: Median sternotomy wires with underlying CABG markers again noted. Cardiomegaly stable. Atheromatous plaque within the aortic arch. Lungs mildly hypoinflated with elevation left hemidiaphragm. Interval worsening and patchy right perihilar and basilar opacity, worrisome for progressive pneumonia. Additional patchy opacity at the left lung base may reflect atelectasis or infiltrate. Mild vascular congestion without overt pulmonary edema. No definite pleural effusion, although the right costophrenic angle  is incompletely visualized. No pneumothorax. No acute osseus abnormality. IMPRESSION: 1. Interval worsening in patchy right perihilar and basilar opacity, worrisome for progressive pneumonia. 2. Elevation of the left hemidiaphragm with additional patchy left basilar patchy opacity, which may reflect atelectasis or additional infiltrate. 3. Stable cardiomegaly with mild pulmonary vascular congestion without overt pulmonary edema. Electronically Signed   By: Jeannine Boga M.D.   On: 10/01/2015 05:15   Dg Chest Port 1 View  09/30/2015  CLINICAL DATA:  Dyspnea, cough, fever. EXAM: PORTABLE CHEST 1 VIEW COMPARISON:  05/31/2015 FINDINGS: There is  unchanged moderate cardiomegaly. There is patchy right base opacity which could represent pneumonia. No large effusion. IMPRESSION: Patchy right lung base opacity, potentially pneumonia although hemorrhage or asymmetric edema could also produce this appearance. Electronically Signed   By: Andreas Newport M.D.   On: 09/30/2015 22:33   5/7: CXR w/ marked worsening aeration on the right. Now also w/ patchy changes on the left   STUDIES:  CXR -- alveolar filling pattern, most dense in RLL. EKG - lateral ST depression, TWI throughout  CULTURES: Blood 5/6 x2 >> Urine 5/6 >>  ANTIBIOTICS: Azithromycin 5/6 >> Ceftriaxone 5/6 >>  SIGNIFICANT EVENTS: Transferred to [facility] MICU for hyxpoxia  LINES/TUBES: None  DISCUSSION: 70 year old female w/ acute hypoxic resp failure in setting of acute onset of diffuse pulmonary infiltrates. Her story is more c/w cardiac than pneumonia. She had been w/out her lasix for at least 3d prior. We are continuing to treat for possible PE; although I think we have a good explanation for her hypoxia by looking at her CXR. For today we will: try to transition off labetolol by adding back her home bb and hydralizine, add IV lasix and cont to wean O2. IF CXR clears would have low threshold to dc abx. Will get LE [place] and decide on VQ in am. Although not sure she has PE.   ASSESSMENT / PLAN:  PULMONARY A: Acute hypoxic Respiratory Failure in setting of bilateral pulmonary infiltrates. Favor Pulmonary Edema over PNA R/o PNA R/o PE; although think we have fairly good explain ation for her hypoxia  P:   Add scheduled lasix See ID section--> will cont CAP coverage for now but w/ nml lactate and story not c/w infection likely not needed.  Will be cautious with fluids Cont heparin  VQ scan when able Get LE dopplers   CARDIOVASCULAR A:  Hx of CAD Acute on chronic diastolic HF HTN emergency w/ associated pulmonary edema  EKG changes P:  Appreciate cards  assist Scheduled lasix Keep statin Add home lopressor and hydralazine  Cont labetolol gtt w/ hope to wean off now that orals are scheduled.  Cont tele   RENAL A:   Acute on Chronic renal insuffiencey: Scr stable  Metabolic acidosis  P:   KVO IVFs Renal US for RAS Tend I&O  GASTROINTESTINAL A:   No active issues P:    HEMATOLOGIC A:   Leukocytosis P:  Reactive, will trend  INFECTIOUS A:   Possible CAP P:   Being treated with typical CAP coverage  ENDOCRINE A:   DM2 -   With hyperglycemia  P:   Cut home insulin in 1/2. Adjust as necessary. Presently NPO.  NEUROLOGIC A:   Depression P:   Keep home Zoloft.   FAMILY  - Updates:   - Inter-disciplinary family meet or Palliative Care meeting due by:  10/08/15  Erick Colace ACNP-BC [place] Pager # [phone] OR #  [phone] if no answer     10/01/2015, 12:57 PM   STAFF NOTE: I, Merrie Roof, MD FACP have personally reviewed patient's available data, including medical history, events of note, physical examination and test results as part of my evaluation. I have discussed with resident/NP and other care providers such as pharmacist, RN and RRT. In addition, I personally evaluated patient and elicited key findings of: awake, no distress, crackles bases rt greater left, this likely is pulm edema although rt greater thenm left ans some alveolar infiltrate rt, low clinical suspicion low PE, would hold off vq, assess dopplers and combine with low clinical suspicion and d dimer not that sig, I guess we can continue casp coverage for now, but with pct and low suspicion infection would favor dc all abx in am ?, would add oral home HTn meds, drop MAP 30-% from highest MAP on admission , cards following for trop leak, r/o ischemia, consider echo, would keep hep drip for now, lasix to neg balance as goal, stay in ICU , need to get off labetolol drip if able, if HR less 70 would use IV hydralazine to  treat The patient is critically ill with multiple organ systems failure and requires high complexity decision making for assessment and support, frequent evaluation and titration of therapies, application of advanced monitoring technologies and extensive interpretation of multiple databases.   Critical Care Time devoted to patient care services described in this note is 30  Minutes. This time reflects time of care of this signee: Merrie Roof, MD FACP. This critical care time does not reflect procedure time, or teaching time or supervisory time of PA/NP/Med student/Med Resident etc but could involve care discussion time. Rest per NP/medical resident whose note is outlined above and that I agree with   Lavon Paganini. Titus Mould, MD, [place] Pgr: [place] [facility] 10/01/2015 1:38 PM

## 2015-10-01 NOTE — H&P (Signed)
PULMONARY / CRITICAL CARE MEDICINE   Name: Briana [name] MRN: [id number] DOB: 19-Dec-1945    ADMISSION DATE:  09/30/2015  CHIEF COMPLAINT:  Chest pain  HISTORY OF PRESENT ILLNESS:   Briana [name] is an 70 y.o. woman with a PMHx most notable for DM2 and cardiovascular disease (CAD, PVD, as well as diastolic HF) who presented to the ED at [facility] on the evening of 5/6 with 24 hours of chest pain. She states that the pain began in her left anterior chest, and progressed and involved more of her chest, including up to her R clavicle. She called a friend to take her to the ED. There, she was found to have hypoxia, a fever to 101.6, WBC of 15.5, EKG changes (lateral ST depression), mild troponin elevation (0.12). BNP was elevated to 894.0, previously was 391. She had a CXR which showed alveolar opacities, most dense in the R base. Cultures were obtained, she was started on CAP coverage, and due to the hypoxia was transferred to [facility] for tx of pneumonia.  PAST MEDICAL HISTORY :  She  has a past medical history of Diabetes mellitus; Hypertension; Hyperlipidemia; Heart murmur; Peripheral vascular disease ([place]); Arthritis; Depression with anxiety; Carotid artery occlusion; Chronic kidney disease; Neuropathy ([place]); CAD (coronary artery disease); Rheumatic fever; Sleep apnea; PAD (peripheral artery disease) ([place]); and Adenomatous colon polyp (09/2007).  PAST SURGICAL HISTORY: She  has past surgical history that includes Coronary artery bypass graft (2005); Carotid endarterectomy (10/04/2009); Tubal ligation; and polypectomy (02/25/2012).  Allergies  Allergen Reactions  . Carbamazepine Other (See Comments)    Unknown reaction-patient is not aware of this allergy  . Clonazepam Other (See Comments)    Patient is not aware of this allergy  . Gemfibrozil Other (See Comments)    Patient is not aware of this allergy but states that she has side effects to a cholesterol medication in the past  . Macrobid  [Nitrofurantoin] Nausea And Vomiting  . Oxycodone Other (See Comments)    hallucinations    No current facility-administered medications on file prior to encounter.   Current Outpatient Prescriptions on File Prior to Encounter  Medication Sig  . aspirin EC 325 MG tablet Take 325 mg by mouth daily.  Marland Kitchen atorvastatin (LIPITOR) 80 MG tablet Take 80 mg by mouth every evening.   . cholecalciferol (VITAMIN D) 1000 UNITS tablet Take 1,000 Units by mouth 2 (two) times daily.   . diazepam (VALIUM) 5 MG tablet Take 5 mg by mouth at bedtime. *Will take one tablet daily as needed for anxiety  . furosemide (LASIX) 40 MG tablet Take 40 mg by mouth daily.   . hydrALAZINE (APRESOLINE) 100 MG tablet Take 1 tablet (100 mg total) by mouth 3 (three) times daily.  Marland Kitchen HYDROcodone-acetaminophen (NORCO) 7.5-325 MG tablet Take 1 tablet by mouth every 4 (four) hours as needed for moderate pain (Must last 30 days.  Do not drive or operate machinery while taking this medicine.).  Marland Kitchen insulin NPH-regular Human (NOVOLIN 70/30) (70-30) 100 UNIT/ML injection Inject 30 Units into the skin 2 (two) times daily with a meal.  . JANUVIA 25 MG tablet Take 1 tablet (25 mg total) by mouth daily.  . metoprolol tartrate (LOPRESSOR) 25 MG tablet Take 1 tablet (25 mg total) by mouth 2 (two) times daily.  . potassium chloride SA (K-DUR,KLOR-CON) 20 MEQ tablet Take 20 mEq by mouth daily.   . sertraline (ZOLOFT) 100 MG tablet Take 200 mg by mouth daily.   Marland Kitchen tiZANidine (  ZANAFLEX) 4 MG capsule Take 4 mg by mouth 3 (three) times daily as needed for muscle spasms (*Takes at bedtime*).     FAMILY HISTORY:  Her indicated that her mother is deceased. She indicated that her father is deceased. She indicated that both of her brothers are deceased.   SOCIAL HISTORY: She  reports that she has never smoked. She has never used smokeless tobacco. She reports that she does not drink alcohol or use illicit drugs.  REVIEW OF SYSTEMS:   Pt denies recent  travel, reports no significant cough, and denies subjective fevers.  SUBJECTIVE:  As above, reports most notably fairly sudden onset chest pain and dyspnea.  VITAL SIGNS: BP 154/59 mmHg  Pulse 60  Temp(Src) 101.6 F (38.7 C) (Rectal)  Resp 25  Ht 5\' 5"  (1.651 m)  Wt 230 lb (104.327 kg)  BMI 38.27 kg/m2  SpO2 99%  HEMODYNAMICS:    VENTILATOR SETTINGS: Vent Mode:  [-]  FiO2 (%):  [70 %] 70 %  INTAKE / OUTPUT:    PHYSICAL EXAMINATION: General:  Elderly woman with O2 mask on, awake, in NAD Neuro:  Grossly normal motor and cognitive function. HEENT:  MMM Cardiovascular:  Heart sounds dual and normal. Lungs:  Few course crackles Abdomen:  Obese, soft Musculoskeletal:  No deformities Skin:  No rashes.  LABS:  BMET  Recent Labs Lab 09/30/15 2140  NA 136  K 4.4  CL 105  CO2 20*  BUN 42*  CREATININE 2.17*  GLUCOSE 302*    Electrolytes  Recent Labs Lab 09/30/15 2140  CALCIUM 9.0    CBC  Recent Labs Lab 09/30/15 2140  WBC 15.5*  HGB 10.6*  HCT 33.5*  PLT 215    Coag's No results for input(s): APTT, INR in the last 168 hours.  Sepsis Markers  Recent Labs Lab 09/30/15 2210  LATICACIDVEN 1.68    ABG  Recent Labs Lab 09/30/15 2245  PHART 7.27*  PCO2ART 44.9  PO2ART 48.6*    Liver Enzymes  Recent Labs Lab 09/30/15 2140  AST 16  ALT 11*  ALKPHOS 72  BILITOT 0.7  ALBUMIN 3.6    Cardiac Enzymes  Recent Labs Lab 09/30/15 2140  TROPONINI 0.12*    Glucose No results for input(s): GLUCAP in the last 168 hours.  Imaging Dg Chest Port 1 View  09/30/2015  CLINICAL DATA:  Dyspnea, cough, fever. EXAM: PORTABLE CHEST 1 VIEW COMPARISON:  05/31/2015 FINDINGS: There is unchanged moderate cardiomegaly. There is patchy right base opacity which could represent pneumonia. No large effusion. IMPRESSION: Patchy right lung base opacity, potentially pneumonia although hemorrhage or asymmetric edema could also produce this appearance.  Electronically Signed   By: Andreas Newport M.D.   On: 09/30/2015 22:33     STUDIES:  CXR -- alveolar filling pattern, most dense in RLL. EKG - lateral ST depression, TWI throughout  CULTURES: Blood 5/6 x2 >> Urine 5/6 >>  ANTIBIOTICS: Azithromycin 5/6 >> Ceftriaxone 5/6 >>  SIGNIFICANT EVENTS: Transferred to [facility] MICU for hyxpoxia  LINES/TUBES: None  DISCUSSION: Agree with tx for sepsis / PNA, but also keep PE high on d/dx, renal function makes CT-PA high risk for causing renal failure, so will treat empirically for both PE/PNA now. Doppler of legs, as a positive study there would be helpful in determining tx plan.  ASSESSMENT / PLAN:  PULMONARY A: Acute hypoxic Respiratory Failure Probable PNA Pulmonary Edema P:   Saturations are good on venti-mask Will tx for both PNA/PE as above. Will  be cautious with fluids  CARDIOVASCULAR A:  Hx of CAD Diastolic HF EKG changes P:  Will  Hold home antihypertensives, keep beta-blocker Keep statin Hold home lasix.  RENAL A:   Acute on Chronic renal insuffiencey  P:   Metabolic Acidosis Will give gentle fluids  GASTROINTESTINAL A:   No active issues P:    HEMATOLOGIC A:   Leukocytosis P:  Reactive, will trend  INFECTIOUS A:   Possible CAP P:   Being treated with typical CAP coverage  ENDOCRINE A:   DM2 -   With hyperglycemia  P:   Cut home insulin in 1/2. Adjust as necessary. Presently NPO.  NEUROLOGIC A:   Depression P:   Keep home Zoloft.   FAMILY  - Updates:   - Inter-disciplinary family meet or Palliative Care meeting due by:  10/08/15   CRITICAL CARE Performed by: Luz Brazen   Total critical care time: 40 minutes  Critical care time was exclusive of separately billable procedures and treating other patients.  Critical care was necessary to treat or prevent imminent or life-threatening deterioration.  Critical care was time spent personally by me on the following  activities: development of treatment plan with patient and/or surrogate as well as nursing, discussions with consultants, evaluation of patient's response to treatment, examination of patient, obtaining history from patient or surrogate, ordering and performing treatments and interventions, ordering and review of laboratory studies, ordering and review of radiographic studies, pulse oximetry and re-evaluation of patient's condition.   Luz Brazen, MD Pulmonary and [place] Pager: [phone]  10/01/2015, 2:31 AM

## 2015-10-01 NOTE — Progress Notes (Signed)
Patient ID: Briana [name], female   DOB: 09/11/1945, 70 y.o.   MRN: [id number]    Subjective:  Denies SSCP, palpitations  SOB improved Low grade temp  Objective:  Filed Vitals:   10/01/15 0815 10/01/15 0830 10/01/15 0835 10/01/15 0845  BP: 158/89 169/55    Pulse: 75 69  69  Temp:   99.1 F (37.3 C)   TempSrc:   Oral   Resp: 24 22  27   Height:      Weight:      SpO2: 96% 97%  97%    Intake/Output from previous day:  Intake/Output Summary (Last 24 hours) at 10/01/15 1004 Last data filed at 10/01/15 0800  Gross per 24 hour  Intake    130 ml  Output    250 ml  Net   -120 ml    Physical Exam: Affect appropriate Obese white female  HEENT: normal Neck supple with no adenopathy JVP normal no bruits no thyromegaly Lungs clear with no wheezing and good diaphragmatic motion Heart:  S1/S2 SEM murmur, no rub, gallop or click PMI normal Abdomen: benighn, BS positve, no tenderness, no AAA no bruit.  No HSM or HJR Distal pulses intact with no bruits No edema Neuro non-focal Skin warm and dry No muscular weakness   Lab Results: Basic Metabolic Panel:  Recent Labs  09/30/15 2140 10/01/15 0413  NA 136 137  K 4.4 4.7  CL 105 107  CO2 20* 19*  GLUCOSE 302* 242*  BUN 42* 38*  CREATININE 2.17* 2.18*  CALCIUM 9.0 9.0  MG  --  2.1  PHOS  --  4.0   Liver Function Tests:  Recent Labs  09/30/15 2140  AST 16  ALT 11*  ALKPHOS 72  BILITOT 0.7  PROT 7.4  ALBUMIN 3.6   CBC:  Recent Labs  09/30/15 2140 10/01/15 0413  WBC 15.5* 12.3*  NEUTROABS 11.7*  --   HGB 10.6* 10.1*  HCT 33.5* 32.9*  MCV 87.5 86.8  PLT 215 183   Cardiac Enzymes:  Recent Labs  09/30/15 2140 10/01/15 0413  TROPONINI 0.12* 1.15*   BNP: Invalid input(s): POCBNP D-Dimer:  Recent Labs  10/01/15 0413  DDIMER 1.71*    Imaging: US Carotid Duplex Bilateral  09/29/2015  CLINICAL DATA:  Bilateral carotid artery stenosis. EXAM: BILATERAL CAROTID DUPLEX ULTRASOUND TECHNIQUE:  Pearline Cables scale imaging, color Doppler and duplex ultrasound were performed of bilateral carotid and vertebral arteries in the neck. COMPARISON:  11/24/2014. FINDINGS: Criteria: Quantification of carotid stenosis is based on velocity parameters that correlate the residual internal carotid diameter with NASCET-based stenosis levels, using the diameter of the distal internal carotid lumen as the denominator for stenosis measurement. The following velocity measurements were obtained: RIGHT ICA:  113/16 cm/sec CCA:  123456 cm/sec SYSTOLIC ICA/CCA RATIO:  1.0 DIASTOLIC ICA/CCA RATIO:  1.5 ECA:  128 cm/sec LEFT ICA:  393/82 cm/sec CCA:  [id number] cm/sec SYSTOLIC ICA/CCA RATIO:  4.6 DIASTOLIC ICA/CCA RATIO:  5.8 ECA:  160 cm/sec RIGHT CAROTID ARTERY: Mild to moderate right carotid bifurcation atherosclerotic vascular disease. No flow limiting stenosis. Degree of stenosis less than 50%. RIGHT VERTEBRAL ARTERY:  Patent with antegrade flow. LEFT CAROTID ARTERY: Prominent left carotid bifurcation and proximal ICA atherosclerotic vascular plaque. Degree of stenosis greater than 70%. LEFT VERTEBRAL ARTERY:  Patent antegrade flow. IMPRESSION: 1. Greater than 70 degrees stenosis left carotid bifurcation and proximal ICA. Vascular surgery consultation suggested. 2. Atherosclerotic vascular plaque right carotid bifurcation. Degree of stenosis less than 50%. Vertebral  arteries are patent antegrade flow. Electronically Signed   By: Marcello Moores  Register   On: 09/29/2015 13:15   Dg Chest Port 1 View  10/01/2015  CLINICAL DATA:  Initial evaluation for acute chest pain. EXAM: PORTABLE CHEST 1 VIEW COMPARISON:  Prior radiograph from 09/30/2015. FINDINGS: Median sternotomy wires with underlying CABG markers again noted. Cardiomegaly stable. Atheromatous plaque within the aortic arch. Lungs mildly hypoinflated with elevation left hemidiaphragm. Interval worsening and patchy right perihilar and basilar opacity, worrisome for progressive pneumonia.  Additional patchy opacity at the left lung base may reflect atelectasis or infiltrate. Mild vascular congestion without overt pulmonary edema. No definite pleural effusion, although the right costophrenic angle is incompletely visualized. No pneumothorax. No acute osseus abnormality. IMPRESSION: 1. Interval worsening in patchy right perihilar and basilar opacity, worrisome for progressive pneumonia. 2. Elevation of the left hemidiaphragm with additional patchy left basilar patchy opacity, which may reflect atelectasis or additional infiltrate. 3. Stable cardiomegaly with mild pulmonary vascular congestion without overt pulmonary edema. Electronically Signed   By: Jeannine Boga M.D.   On: 10/01/2015 05:15   Dg Chest Port 1 View  09/30/2015  CLINICAL DATA:  Dyspnea, cough, fever. EXAM: PORTABLE CHEST 1 VIEW COMPARISON:  05/31/2015 FINDINGS: There is unchanged moderate cardiomegaly. There is patchy right base opacity which could represent pneumonia. No large effusion. IMPRESSION: Patchy right lung base opacity, potentially pneumonia although hemorrhage or asymmetric edema could also produce this appearance. Electronically Signed   By: Andreas Newport M.D.   On: 09/30/2015 22:33    Cardiac Studies:  ECG: SR rate 80 RBBB   Telemetry:  NSR 10/01/2015   Echo: 10/27/13 EF 65% mild AS   Medications:   . atorvastatin  80 mg Oral QPM  . azithromycin  500 mg Intravenous Q24H  . cefTRIAXone (ROCEPHIN)  IV  1 g Intravenous Q24H  . insulin aspart protamine- aspart  15 Units Subcutaneous BID WC  . sertraline  200 mg Oral Daily     . heparin 1,400 Units/hr (10/01/15 0454)  . labetalol (NORMODYNE) infusion 0.5 mg/min (10/01/15 [id number])    Assessment/Plan:  Pneumonia:  Per CCM continue ceftriaxone low grade temp D-dimer:  V/Q ordered as Cr is up CHF: f/u echo ordered ? Diastolic consider once daily lasix given elevated BNP Troponin no chest pain in setting elevated Cr not likely significant ? PE CABG  2005   LIMA to LAD, SVG to OM 2, and SVG to distal RCA Will likely need lexiscan myovue latter in hospitalization vs outpatient HTN:  Start hydralazine and nitrates needs f/u renal duplex for RAS continue labetolol and transition to po dose   Jenkins Rouge 10/01/2015, 10:04 AM

## 2015-10-01 NOTE — Progress Notes (Addendum)
eLink Physician-Brief Progress Note Patient Name: Briana [name] DOB: 05-30-1945 MRN: [id number]   Date of Service  10/01/2015  HPI/Events of Note  pe fellow - on iv heparin for presumed PE. RN calling now saying patient c/o chest pain  eICU Interventions  Troponin stat,  Lactate stat Pct stat ekg stat -> nil acute Labs stat  Rx babty asa Start Iv heparin gtt -not done yet S/lnitro IV lopressor 2.5mg  x 1 (HR 78 and SBP 178) Echo stat - ordered , d/w o call cards fellow 4:58 AM      Intervention Category Major Interventions: Other:  Chardonnay Holzmann 10/01/2015, 4:26 AM

## 2015-10-02 ENCOUNTER — Other Ambulatory Visit: Payer: Self-pay | Admitting: Vascular Surgery

## 2015-10-02 ENCOUNTER — Inpatient Hospital Stay (HOSPITAL_COMMUNITY): Payer: Medicare Other

## 2015-10-02 ENCOUNTER — Other Ambulatory Visit: Payer: Self-pay

## 2015-10-02 ENCOUNTER — Other Ambulatory Visit: Payer: Self-pay | Admitting: Pediatrics

## 2015-10-02 DIAGNOSIS — R7989 Other specified abnormal findings of blood chemistry: Secondary | ICD-10-CM

## 2015-10-02 DIAGNOSIS — I6522 Occlusion and stenosis of left carotid artery: Secondary | ICD-10-CM

## 2015-10-02 DIAGNOSIS — R0600 Dyspnea, unspecified: Secondary | ICD-10-CM

## 2015-10-02 DIAGNOSIS — J9601 Acute respiratory failure with hypoxia: Secondary | ICD-10-CM

## 2015-10-02 DIAGNOSIS — R079 Chest pain, unspecified: Secondary | ICD-10-CM

## 2015-10-02 DIAGNOSIS — J810 Acute pulmonary edema: Secondary | ICD-10-CM

## 2015-10-02 LAB — COMPREHENSIVE METABOLIC PANEL
ALK PHOS: 55 U/L (ref 38–126)
ALT: 10 U/L — AB (ref 14–54)
AST: 15 U/L (ref 15–41)
Albumin: 2.5 g/dL — ABNORMAL LOW (ref 3.5–5.0)
Anion gap: 12 (ref 5–15)
BUN: 49 mg/dL — ABNORMAL HIGH (ref 6–20)
CHLORIDE: 105 mmol/L (ref 101–111)
CO2: 17 mmol/L — AB (ref 22–32)
CREATININE: 3.01 mg/dL — AB (ref 0.44–1.00)
Calcium: 7.9 mg/dL — ABNORMAL LOW (ref 8.9–10.3)
GFR calc Af Amer: 17 mL/min — ABNORMAL LOW (ref >60)
GFR calc non Af Amer: 15 mL/min — ABNORMAL LOW (ref >60)
GLUCOSE: 212 mg/dL — AB (ref 65–99)
Potassium: 4.5 mmol/L (ref 3.5–5.1)
SODIUM: 134 mmol/L — AB (ref 135–145)
Total Bilirubin: 0.4 mg/dL (ref 0.3–1.2)
Total Protein: 5.6 g/dL — ABNORMAL LOW (ref 6.5–8.1)

## 2015-10-02 LAB — ECHOCARDIOGRAM COMPLETE
Height: 65 in
WEIGHTICAEL: 3731.95 [oz_av]

## 2015-10-02 LAB — CBC
HCT: 24.5 % — ABNORMAL LOW (ref 36.0–46.0)
HEMOGLOBIN: 7.5 g/dL — AB (ref 12.0–15.0)
MCH: 27.6 pg (ref 26.0–34.0)
MCHC: 30.6 g/dL (ref 30.0–36.0)
MCV: 90.1 fL (ref 78.0–100.0)
PLATELETS: 120 10*3/uL — AB (ref 150–400)
RBC: 2.72 MIL/uL — AB (ref 3.87–5.11)
RDW: 14.8 % (ref 11.5–15.5)
WBC: 8.8 10*3/uL (ref 4.0–10.5)

## 2015-10-02 LAB — HEMOGLOBIN AND HEMATOCRIT, BLOOD
HCT: 27.6 % — ABNORMAL LOW (ref 36.0–46.0)
Hemoglobin: 8.4 g/dL — ABNORMAL LOW (ref 12.0–15.0)

## 2015-10-02 LAB — BRAIN NATRIURETIC PEPTIDE: B Natriuretic Peptide: 1262.5 pg/mL — ABNORMAL HIGH (ref 0.0–100.0)

## 2015-10-02 LAB — HEPARIN LEVEL (UNFRACTIONATED): Heparin Unfractionated: 0.39 IU/mL (ref 0.30–0.70)

## 2015-10-02 LAB — TROPONIN I: Troponin I: 0.66 ng/mL (ref <0.031)

## 2015-10-02 LAB — GLUCOSE, CAPILLARY
GLUCOSE-CAPILLARY: 154 mg/dL — AB (ref 65–99)
Glucose-Capillary: 229 mg/dL — ABNORMAL HIGH (ref 65–99)
Glucose-Capillary: 212 mg/dL — ABNORMAL HIGH (ref 65–99)
Glucose-Capillary: 191 mg/dL — ABNORMAL HIGH (ref 65–99)

## 2015-10-02 LAB — PROCALCITONIN: Procalcitonin: 0.69 ng/mL

## 2015-10-02 MED ORDER — HEPARIN (PORCINE) IN NACL 100-0.45 UNIT/ML-% IJ SOLN
1650.0000 [IU]/h | INTRAMUSCULAR | Status: DC
  Administered 2015-10-02: 1500 [IU]/h via INTRAVENOUS
  Administered 2015-10-04: 1650 [IU]/h via INTRAVENOUS
  Filled 2015-10-02 (×5): qty 250

## 2015-10-02 MED ORDER — SERTRALINE HCL 100 MG PO TABS
100.0000 mg | ORAL_TABLET | Freq: Every day | ORAL | Status: DC
  Administered 2015-10-03 – 2015-10-18 (×16): 100 mg via ORAL
  Filled 2015-10-02 (×16): qty 1

## 2015-10-02 MED ORDER — ASPIRIN EC 81 MG PO TBEC
81.0000 mg | DELAYED_RELEASE_TABLET | Freq: Every day | ORAL | Status: DC
  Administered 2015-10-02 – 2015-10-18 (×17): 81 mg via ORAL
  Filled 2015-10-02 (×18): qty 1

## 2015-10-02 MED ORDER — NITROGLYCERIN IN D5W 200-5 MCG/ML-% IV SOLN
0.0000 ug/min | INTRAVENOUS | Status: DC
Start: 2015-10-02 — End: 2015-10-03
  Administered 2015-10-02: 5 ug/min via INTRAVENOUS
  Filled 2015-10-02: qty 250

## 2015-10-02 MED ORDER — HEPARIN BOLUS VIA INFUSION
2400.0000 [IU] | Freq: Once | INTRAVENOUS | Status: AC
  Administered 2015-10-02: 2400 [IU] via INTRAVENOUS
  Filled 2015-10-02: qty 2400

## 2015-10-02 MED ORDER — FUROSEMIDE 10 MG/ML IJ SOLN
60.0000 mg | Freq: Once | INTRAMUSCULAR | Status: AC
  Administered 2015-10-02: 60 mg via INTRAVENOUS
  Filled 2015-10-02: qty 6

## 2015-10-02 MED ORDER — INSULIN ASPART 100 UNIT/ML ~~LOC~~ SOLN
0.0000 [IU] | SUBCUTANEOUS | Status: DC
  Administered 2015-10-02: 2 [IU] via SUBCUTANEOUS
  Administered 2015-10-02: 3 [IU] via SUBCUTANEOUS
  Administered 2015-10-02: 2 [IU] via SUBCUTANEOUS
  Administered 2015-10-02 – 2015-10-03 (×2): 3 [IU] via SUBCUTANEOUS
  Administered 2015-10-03 (×2): 2 [IU] via SUBCUTANEOUS

## 2015-10-02 NOTE — Progress Notes (Signed)
PULMONARY / CRITICAL CARE MEDICINE   Name: Briana [name] MRN: [id number] DOB: August 18, 1945    ADMISSION DATE:  09/30/2015  CHIEF COMPLAINT:  Chest pain  HISTORY OF PRESENT ILLNESS:   Briana [name] is an 70 y.o. woman with a PMHx most notable for DM2 and cardiovascular disease (CAD, PVD, as well as diastolic HF) who presented to the ED at [facility] on the evening of 5/6 with 24 hours of chest pain. In ED found to have: hypoxia, a fever to 101.6, WBC of 15.5, EKG changes (lateral ST depression), mild troponin elevation (0.12). BNP was elevated to 894.0, previously was 391. She had a CXR which showed alveolar opacities, most dense in the R base. Cultures were obtained, she was started on CAP coverage, and due to the hypoxia was transferred to [facility] for tx of pneumonia.   SUBJECTIVE:  No CP/dyspnea Afebrile No sig diuresis Hb drop this am    VITAL SIGNS: BP 130/73 mmHg  Pulse 68  Temp(Src) 99.3 F (37.4 C) (Oral)  Resp 24  Ht 5\' 5"  (1.651 m)  Wt 233 lb 4 oz (105.8 kg)  BMI 38.81 kg/m2  SpO2 97% 100% NRB HEMODYNAMICS:    VENTILATOR SETTINGS:    INTAKE / OUTPUT: I/O last 3 completed shifts: In: [id number] [P.O.:444; I.V.:754; IV Piggyback:300] Out: 500 [Urine:500]  PHYSICAL EXAMINATION: General:  Elderly woman with [place] on, awake, in NAD Neuro:  Grossly normal motor , alert, interactive HEENT:  MMM Cardiovascular:  Heart sounds dual and normal. Lungs:  Few course crackles; no accessory use  Abdomen:  Obese, soft Musculoskeletal:  No deformities Skin:  No rashes.  LABS:  BMET  Recent Labs Lab 09/30/15 2140 10/01/15 0413 10/02/15 0223  NA 136 137 134*  K 4.4 4.7 4.5  CL 105 107 105  CO2 20* 19* 17*  BUN 42* 38* 49*  CREATININE 2.17* 2.18* 3.01*  GLUCOSE 302* 242* 212*    Electrolytes  Recent Labs Lab 09/30/15 2140 10/01/15 0413 10/02/15 0223  CALCIUM 9.0 9.0 7.9*  MG  --  2.1  --   PHOS  --  4.0  --     CBC  Recent Labs Lab 09/30/15 2140  10/01/15 0413 10/02/15 0223  WBC 15.5* 12.3* 8.8  HGB 10.6* 10.1* 7.5*  HCT 33.5* 32.9* 24.5*  PLT 215 183 120*    Coag's No results for input(s): APTT, INR in the last 168 hours.  Sepsis Markers  Recent Labs Lab 09/30/15 2210 10/01/15 0413 10/01/15 0815 10/02/15 0223  LATICACIDVEN 1.68  --  0.8  --   PROCALCITON  --  0.18  --  0.69    ABG  Recent Labs Lab 09/30/15 2245 10/01/15 0421  PHART 7.27* 7.628*  PCO2ART 44.9 17.6*  PO2ART 48.6* 138.0*    Liver Enzymes  Recent Labs Lab 09/30/15 2140 10/02/15 0223  AST 16 15  ALT 11* 10*  ALKPHOS 72 55  BILITOT 0.7 0.4  ALBUMIN 3.6 2.5*    Cardiac Enzymes  Recent Labs Lab 10/01/15 0413 10/01/15 1456 10/01/15 2212  TROPONINI 1.15* 2.13* 1.55*    Glucose  Recent Labs Lab 10/01/15 0244 10/02/15 0819 10/02/15 1101  GLUCAP 263* 154* 229*    Imaging No results found. 5/7: CXR w/ marked worsening aeration on the right. Now also w/ patchy changes on the left   STUDIES:  CXR -- alveolar filling pattern, most dense in RLL. EKG - lateral ST depression, TWI throughout  CULTURES: Blood 5/6 x2 >>ng Urine 5/6 >>  ANTIBIOTICS: Azithromycin 5/6 >> Ceftriaxone 5/6 >>  SIGNIFICANT EVENTS: Transferred to [facility] MICU for hyxpoxia  LINES/TUBES: None  DISCUSSION: 70 year old female w/ acute hypoxic resp failure in setting of acute onset of diffuse pulmonary infiltrates. Her story is more c/w cardiac than pneumonia. She had been w/out her lasix for at least 3d prior. We are continuing to treat for possible PE; although I think we have a good explanation for her hypoxia by looking at her CXR.   ASSESSMENT / PLAN:  PULMONARY A: Acute hypoxic Respiratory Failure in setting of bilateral pulmonary infiltrates. Favor Pulmonary Edema over PNA R/o PNA R/o PE; although think we have fairly good explanation for her hypoxia  LE dopplers neg P:    See ID section--> will cont CAP coverage for now but w/ nml  lactate and story not c/w infection likely not needed.  Will be cautious with fluids dc heparin since Hb drops VQ scan when able   CARDIOVASCULAR A:  Hx of CAD Acute on chronic diastolic HF HTN emergency w/ associated pulmonary edema  EKG changes P:  Appreciate cards assist Scheduled lasix Keep statin Add home lopressor and hydralazine  Off labetolol gtt  Cont tele   RENAL A:   Acute on Chronic renal insuffiencey: Scr rising Metabolic acidosis  P:   KVO IVFs Renal US for RAS Tend I&O  GASTROINTESTINAL A:   No active issues P:    HEMATOLOGIC A:   Leukocytosis P:  Reactive, will trend  INFECTIOUS A:   Possible CAP P:   Being treated with typical CAP coverage  ENDOCRINE A:   DM2 -   With hyperglycemia  P:   Cut home insulin in 1/2. Adjust as necessary. Presently NPO.  NEUROLOGIC A:   Depression P:   home Zoloft dose   FAMILY  - Updates:   - Inter-disciplinary family meet or Palliative Care meeting due by: NA  The patient is critically ill with multiple organ systems failure and requires high complexity decision making for assessment and support, frequent evaluation and titration of therapies, application of advanced monitoring technologies and extensive interpretation of multiple databases. Critical Care Time devoted to patient care services described in this note independent of APP time is 30 minutes.   Kara Mead MD. [place] Flood. [place] [facility] Pager [phone] If no response call [phone]   10/02/2015    10/02/2015, 12:38 PM

## 2015-10-02 NOTE — Care Management Note (Signed)
Case Management Note  Patient Details  Name: Briana [name] MRN: [id number] Date of Birth: Jan 17, 1946  Subjective/Objective:   Pt admitted with acute respiratory failure and chest pain                 Action/Plan:  PTA- independent from home with husband.  CM will continue to monitor for disposition needs   Expected Discharge Date:                  Expected Discharge Plan:  Home/Self Care  In-House Referral:     Discharge planning Services  CM Consult  Post Acute Care Choice:    Choice offered to:     DME Arranged:    DME Agency:     HH Arranged:    HH Agency:     Status of Service:  In process, will continue to follow  Medicare Important Message Given:    Date Medicare IM Given:    Medicare IM give by:    Date Additional Medicare IM Given:    Additional Medicare Important Message give by:     If discussed at [place] of Stay Meetings, dates discussed:    Additional Comments:  Maryclare Labrador, RN 10/02/2015, 2:28 PM

## 2015-10-02 NOTE — Progress Notes (Signed)
Echocardiogram 2D Echocardiogram has been performed.  Tresa Res 10/02/2015, 11:34 AM

## 2015-10-02 NOTE — Progress Notes (Signed)
eLink Physician-Brief Progress Note Patient Name: Briana [name] DOB: 1945/10/18 MRN: [id number]   Date of Service  10/02/2015  HPI/Events of Note  Chest pain - patial relief with SL NTG. BP = 197/57 and HR = 77. Sat = 94% on 10 L.min FM O2. EKG with inverted T wave in I and L c/w lateral ischemia.   eICU Interventions  Will order: 1. NTG IV infusion. Titrate for relief of chest pain. 2. Cycle Troponin 3. Heparin IV infusion per pharmacy consultation. 4. Portable CXR STAT.  5. Will have bedside nurse ask cardiology to see the patient as well. 6. Will ask PCCM floor coverage to assess patient at the bedside as well.      Intervention Category Major Interventions: Acid-Base disturbance - evaluation and management;Other:  Lysle Dingwall 10/02/2015, 9:14 PM

## 2015-10-02 NOTE — Progress Notes (Signed)
ANTICOAGULATION CONSULT NOTE - Follow Up Consult  Pharmacy Consult for Heparin Indication: r/o pulmonary embolus  Allergies  Allergen Reactions  . Carbamazepine Other (See Comments)    Reaction to tegretol - loopy  . Clonazepam Other (See Comments)    Unknown reaction  . Gemfibrozil Other (See Comments)    Patient is not aware of this allergy but states that she has side effects to a cholesterol medication in the past  . Macrobid [Nitrofurantoin] Nausea And Vomiting  . Oxycodone Other (See Comments)    hallucinations    Patient Measurements: Height: 5\' 5"  (165.1 cm) Weight: 228 lb 9.9 oz (103.7 kg) IBW/kg (Calculated) : 57 Heparin Dosing Weight: 93 kg  Vital Signs: Temp: 99.7 F (37.6 C) (05/07 2329) Temp Source: Oral (05/07 2329) BP: 121/60 mmHg (05/08 0000) Pulse Rate: 64 (05/08 0000)  Labs:  Recent Labs  09/30/15 2140 10/01/15 0413 10/01/15 1455 10/01/15 1456 10/01/15 2212  HGB 10.6* 10.1*  --   --   --   HCT 33.5* 32.9*  --   --   --   PLT 215 183  --   --   --   HEPARINUNFRC  --   --  0.39  --  0.31  CREATININE 2.17* 2.18*  --   --   --   TROPONINI 0.12* 1.15*  --  2.13* 1.55*    Estimated Creatinine Clearance: 28.7 mL/min (by C-G formula based on Cr of 2.18).   Medications:  Heparin @ 1400 units/hr (14 ml/hr)  Assessment: 69 YOF on heparin for r/o PE. Heparin now down to 0.31 (low end of therapeutic). No issues with line or bleeding reported per RN. Dopplers neg for DVT, still awaiting VQ scan.  Goal of Therapy:  Heparin level 0.3-0.7 units/ml Monitor platelets by anticoagulation protocol: Yes   Plan:  Increase heparin to 1500 units/hr Will f/u 8 hr heparin level  Sherlon Handing, PharmD, BCPS Clinical pharmacist, pager [phone] 10/02/2015 12:10 AM

## 2015-10-02 NOTE — Progress Notes (Addendum)
Patient Name: Briana [name] Date of Encounter: 10/02/2015  Active Problems:   Acute respiratory failure ([place])   Hypoxia   Elevated troponin   Chest pain   DVT (deep venous thrombosis) ([place])   Hypoxemia   Primary Cardiologist: Dr. Bronson Ing Patient Profile: Briana [name] is a 70 y.o. female with known hx of CAD s/p CABG on 05/23/2004 with LIMA to LAD, SVG to OM 2, and SVG to distal RCA, coronary artery disease, carotid artery stenosis, hypertensive heart disease with severe LVH, and chronic diastolic heart failure. She presented to [facility] with shortness of breath and chest pain x 2 days. She stated pain was sharp, right sided, rated 10/10 at its worse and was constant all day yesterday. She also had non-productive cough. In the ED, patient was hypoxic, febrile with leukocytosis. Her initial troponin was 0.12 and BNP was 849. She was placed on BiPAP and started on empiric antibiotics for PNA. She was transferred to [facility] for further evaluation and management. Patient has been off BiPAP on NRB here. She however complained of chest pain returning. This was relieved with 1 SL NTG. Cardiology was consulted for assistance with management.  SUBJECTIVE: Feels well.  No complaints. Denies chest pain.    OBJECTIVE Filed Vitals:   10/02/15 0800 10/02/15 0820 10/02/15 0900 10/02/15 0932  BP: 143/47  147/47 147/47  Pulse: 66  70 82  Temp:  99.6 F (37.6 C)    TempSrc:  Oral    Resp: 23  18   Height:      Weight:      SpO2: 94%  95%     Intake/Output Summary (Last 24 hours) at 10/02/15 1014 Last data filed at 10/02/15 0900  Gross per 24 hour  Intake 1134.41 ml  Output    250 ml  Net 884.41 ml   Filed Weights   09/30/15 2138 10/01/15 0300  Weight: 230 lb (104.327 kg) 228 lb 9.9 oz (103.7 kg)    PHYSICAL EXAM General: Well developed, well nourished,obese female in no acute distress. Head: Normocephalic, atraumatic.  Neck: Supple without bruits, No JVD. Lungs:  Resp  regular and unlabored, crackles in bilateral lower lobes.  Heart: RRR, S1, S2, no S3, S4, or murmur; no rub. Abdomen: Soft, non-tender, non-distended, BS + x 4.  Extremities: No clubbing, cyanosis, No pretibial edema.  Neuro: Alert and oriented X 3. Moves all extremities spontaneously. Psych: Normal affect.  LABS: CBC: Recent Labs  09/30/15 2140 10/01/15 0413 10/02/15 0223  WBC 15.5* 12.3* 8.8  NEUTROABS 11.7*  --   --   HGB 10.6* 10.1* 7.5*  HCT 33.5* 32.9* 24.5*  MCV 87.5 86.8 90.1  PLT 215 183 123456*   Basic Metabolic Panel: Recent Labs  10/01/15 0413 10/02/15 0223  NA 137 134*  K 4.7 4.5  CL 107 105  CO2 19* 17*  GLUCOSE 242* 212*  BUN 38* 49*  CREATININE 2.18* 3.01*  CALCIUM 9.0 7.9*  MG 2.1  --   PHOS 4.0  --    Liver Function Tests: Recent Labs  09/30/15 2140 10/02/15 0223  AST 16 15  ALT 11* 10*  ALKPHOS 72 55  BILITOT 0.7 0.4  PROT 7.4 5.6*  ALBUMIN 3.6 2.5*   Cardiac Enzymes: Recent Labs  10/01/15 0413 10/01/15 1456 10/01/15 2212  TROPONINI 1.15* 2.13* 1.55*   BNP:  B NATRIURETIC PEPTIDE  Date/Time Value Ref Range Status  09/30/2015 09:40 PM 894.0* 0.0 - 100.0 pg/mL Final  05/31/2015 03:22  PM 391.0* 0.0 - 100.0 pg/mL Final    D-dimer: Recent Labs  10/01/15 0413  DDIMER 1.71*     Current facility-administered medications:  .  0.9 %  sodium chloride infusion, 250 mL, Intravenous, PRN, Luz Brazen, MD .  atorvastatin (LIPITOR) tablet 80 mg, 80 mg, Oral, QPM, Luz Brazen, MD, 80 mg at 10/01/15 1830 .  azithromycin (ZITHROMAX) 500 mg in dextrose 5 % 250 mL IVPB, 500 mg, Intravenous, Q24H, Orlie Dakin, MD, 500 mg at 10/01/15 2152 .  cefTRIAXone (ROCEPHIN) 1 g in dextrose 5 % 50 mL IVPB, 1 g, Intravenous, Q24H, Sam Jacubowitz, MD, 1 g at 10/01/15 2148 .  furosemide (LASIX) injection 40 mg, 40 mg, Intravenous, Daily, Erick Colace, NP, 40 mg at 10/02/15 0932 .  heparin ADULT infusion 100 units/mL (25000 units/250 mL), 1,500  Units/hr, Intravenous, Continuous, Franky Macho, RPH, Last Rate: 15 mL/hr at 10/02/15 0028, 1,500 Units/hr at 10/02/15 0028 .  hydrALAZINE (APRESOLINE) tablet 100 mg, 100 mg, Oral, Q8H, Erick Colace, NP, 100 mg at 10/02/15 0717 .  insulin aspart (novoLOG) injection 0-9 Units, 0-9 Units, Subcutaneous, Q4H, Jose Shirl Harris, MD, 2 Units at 10/02/15 0845 .  insulin aspart protamine- aspart (NOVOLOG MIX 70/30) injection 15 Units, 15 Units, Subcutaneous, BID WC, Luz Brazen, MD, 15 Units at 10/01/15 1830 .  labetalol (NORMODYNE,TRANDATE) 500 mg in dextrose 5 % 125 mL (4 mg/mL) infusion, 0.5-3 mg/min, Intravenous, Titrated, Alwyn Pea, MD, Stopped at 10/01/15 1655 .  metoprolol tartrate (LOPRESSOR) tablet 25 mg, 25 mg, Oral, BID, Erick Colace, NP, 25 mg at 10/02/15 0932 .  nitroGLYCERIN (NITROSTAT) SL tablet 0.4 mg, 0.4 mg, Sublingual, Q5 min PRN, Brand Males, MD .  sertraline (ZOLOFT) tablet 200 mg, 200 mg, Oral, Daily, Luz Brazen, MD, 100 mg at 10/02/15 0933 . heparin 1,500 Units/hr (10/02/15 0028)  . labetalol (NORMODYNE) infusion Stopped (10/01/15 1655)   TELE:NSR  V4-V6. Inverted T waves in V1-V2.   ECG: NSR, RBBB, ST depression in   Radiology/Studies: Dg Chest Port 1 View  10/01/2015  CLINICAL DATA:  Initial evaluation for acute chest pain. EXAM: PORTABLE CHEST 1 VIEW COMPARISON:  Prior radiograph from 09/30/2015. FINDINGS: Median sternotomy wires with underlying CABG markers again noted. Cardiomegaly stable. Atheromatous plaque within the aortic arch. Lungs mildly hypoinflated with elevation left hemidiaphragm. Interval worsening and patchy right perihilar and basilar opacity, worrisome for progressive pneumonia. Additional patchy opacity at the left lung base may reflect atelectasis or infiltrate. Mild vascular congestion without overt pulmonary edema. No definite pleural effusion, although the right costophrenic angle is incompletely visualized. No pneumothorax. No  acute osseus abnormality. IMPRESSION: 1. Interval worsening in patchy right perihilar and basilar opacity, worrisome for progressive pneumonia. 2. Elevation of the left hemidiaphragm with additional patchy left basilar patchy opacity, which may reflect atelectasis or additional infiltrate. 3. Stable cardiomegaly with mild pulmonary vascular congestion without overt pulmonary edema. Electronically Signed   By: Jeannine Boga M.D.   On: 10/01/2015 05:15   Dg Chest Port 1 View  09/30/2015  CLINICAL DATA:  Dyspnea, cough, fever. EXAM: PORTABLE CHEST 1 VIEW COMPARISON:  05/31/2015 FINDINGS: There is unchanged moderate cardiomegaly. There is patchy right base opacity which could represent pneumonia. No large effusion. IMPRESSION: Patchy right lung base opacity, potentially pneumonia although hemorrhage or asymmetric edema could also produce this appearance. Electronically Signed   By: Andreas Newport M.D.   On: 09/30/2015 22:33     Current Medications:  . atorvastatin  80  mg Oral QPM  . azithromycin  500 mg Intravenous Q24H  . cefTRIAXone (ROCEPHIN)  IV  1 g Intravenous Q24H  . furosemide  40 mg Intravenous Daily  . hydrALAZINE  100 mg Oral Q8H  . insulin aspart  0-9 Units Subcutaneous Q4H  . insulin aspart protamine- aspart  15 Units Subcutaneous BID WC  . metoprolol tartrate  25 mg Oral BID  . sertraline  200 mg Oral Daily   . heparin 1,500 Units/hr (10/02/15 0028)  . labetalol (NORMODYNE) infusion Stopped (10/01/15 1655)    ASSESSMENT AND PLAN: Active Problems:   Acute respiratory failure ([facility])   Hypoxia   Elevated troponin   Chest pain   DVT (deep venous thrombosis) ([facility])   Hypoxemia  1. Elevated troponin: patient presented with SOB and chest pain x 2 days. She states she was having intense sharp pain in the center of her chest with radiation to her neck and jaw. Pain occurred at rest and was not reproducible with palpation. She was found to have PNA at [facility] and required  Bipap for hypoxia. BNP was elevated at 849, troponin has been 1.15>>2.13>>1.55. She does have a history of CAD with CABG in 2005. Echo is pending. She would benefit from ischemic evaluation at some point once she has recovered from respiratory issues.  She is on heparin gtt.   2. Acute on chronic diastolic CHF with elevated BNP: patient presented with hypoxia and found to have PNA.  Echo results pending. On IV furosemide, creatinine is up to 3.01.Discontinue IV Lasix.   3. HTN: Off Labetalol gtt.  Hydralazine and metoprolol added yesterday.   4. Sinus tachycardia: Patient having episodes of ST with rate in 140's,  not associated with activity. She can feel her heart palpating when this occurs, states that she has felt this since she was 70 years old.  She has a history of rheumatic heart disease.    Signed, Arbutus Leas , NP 10:14 AM 10/02/2015 Pager [phone]  Patient seen and independently examined with Jettie Booze, NP. We discussed all aspects of the encounter. I agree with the assessment and plan as stated above.  Patient has a history of ASCAD with remote CABG in 2005 and no ischemic workup since then, admitted with acute respiratory failure secondary to PNA and sharp chest pain that was right sided and on same side as PNA.  Now on antibx.  Her troponin was elevated on admission with peaked at 2.13 and now trending downward.  2D echo is pending.  She also has evidence on chest xray for acute on chronic diastolic CHF and BNP elevated.  She has been getting IV lasix but is net positive 1.1L.  Her creatinine has bumped and her Cxray yesterday showed mild pulmonary vascular congestion with no overt edema.  Would stop diuretics for now and check repeat BNP.  She was hypertensive and initially started on Labetolol gtt but now on metoprolol and hydralazine with controlled BP.  She was somewhat tachycardic earlier most likely secondary to underlying infection but now HR under better control on BB.   Continue statin and BB for CAD.  Would place back on ASA 81mg  daily.  Check TSH. Her d-dimer is elevated but this is in the setting of acute illness.  Cannot get Chest CT angio due to underlying acute on CKD.  Consider VQ scan once chest xray clears.  Consider oupt coronary ischemia workup once PNA resolved.  I have spent a total of 35 minutes with  patient, interviewing and examining patient as well as reviewing lab data, chest xrays, EKGs and telemetry as well as developing assessment and plan.  > 50% of time was spent in direct patient care.  Signed: Fransico Him, MD [facility] 10/02/2015

## 2015-10-02 NOTE — Progress Notes (Signed)
Inpatient Diabetes Program Recommendations  AACE/ADA: New Consensus Statement on Inpatient Glycemic Control (2015)  Target Ranges:  Prepandial:   less than 140 mg/dL      Peak postprandial:   less than 180 mg/dL (1-2 hours)      Critically ill patients:  140 - 180 mg/dL   Review of Glycemic Control  Diabetes history: DM 2 Outpatient Diabetes medications: 70/30 30 units BID, Januvia 25 Daily Current orders for Inpatient glycemic control: 70/30 15 units BID, Novolog Sensitive Q4hrs  Inpatient Diabetes Program Recommendations:   Glucose readings not in EPIC lab glucose 212 mg/dl at 0223 am. Fasting Finger stick 154 mg/dl. Watch on current regimen. No recommendations at this time.   Thanks,  Tama Headings RN, MSN, [facility] Inpatient Diabetes Coordinator Team Pager [phone] (8a-5p)

## 2015-10-02 NOTE — Progress Notes (Signed)
eLink Physician-Brief Progress Note Patient Name: Briana [name] DOB: [birth year] MRN: [id number]   Date of Service  10/02/2015  HPI/Events of Note  RN calls for CBGs in 200-250 mg% On 70/30 15 u BID. Pt eating some  eICU Interventions  Start sliding scale. If better appetite and creat better, consider inc 70/30 dose today. Was on 30 u BID at home        [place] 10/02/2015, 4:50 AM

## 2015-10-02 NOTE — Progress Notes (Addendum)
ANTICOAGULATION CONSULT NOTE - Follow Up Consult  Pharmacy Consult:  Heparin Indication: r/o pulmonary embolus  Allergies  Allergen Reactions  . Carbamazepine Other (See Comments)    Reaction to tegretol - loopy  . Clonazepam Other (See Comments)    Unknown reaction  . Gemfibrozil Other (See Comments)    Patient is not aware of this allergy but states that she has side effects to a cholesterol medication in the past  . Macrobid [Nitrofurantoin] Nausea And Vomiting  . Oxycodone Other (See Comments)    hallucinations    Patient Measurements: Height: 5\' 5"  (165.1 cm) Weight: 233 lb 4 oz (105.8 kg) IBW/kg (Calculated) : 57 Heparin Dosing Weight: 93 kg  Vital Signs: Temp: 99.3 F (37.4 C) (05/08 1102) Temp Source: Oral (05/08 1102) BP: 130/73 mmHg (05/08 1000) Pulse Rate: 68 (05/08 1000)  Labs:  Recent Labs  09/30/15 2140 10/01/15 0413 10/01/15 1455 10/01/15 1456 10/01/15 2212 10/02/15 0223 10/02/15 0741  HGB 10.6* 10.1*  --   --   --  7.5*  --   HCT 33.5* 32.9*  --   --   --  24.5*  --   PLT 215 183  --   --   --  120*  --   HEPARINUNFRC  --   --  0.39  --  0.31  --  0.39  CREATININE 2.17* 2.18*  --   --   --  3.01*  --   TROPONINI 0.12* 1.15*  --  2.13* 1.55*  --   --     Estimated Creatinine Clearance: 21 mL/min (by C-G formula based on Cr of 3.01).     Assessment: 23 YOF continue on IV heparin for possible PE - awaiting VQ scan.  Doppler negative for DVT.  Heparin level is therapeutic.  Although patient's hemoglobin and platelet count dropped, no bleeding has been reported.   Goal of Therapy:  Heparin level 0.3-0.7 units/ml Monitor platelets by anticoagulation protocol: Yes    Plan:  - Continue heparin gtt at 1500 units/hr - Daily HL / CBC - F/U VQ Scan result, ECHO    Kearie Mennen D. Mina Marble, PharmD, BCPS Pager:  [phone] 10/02/2015, 11:31 AM

## 2015-10-02 NOTE — Progress Notes (Signed)
ANTICOAGULATION CONSULT NOTE - Follow Up Consult  Pharmacy Consult:  Heparin Indication: chest pain/ACS  Allergies  Allergen Reactions  . Carbamazepine Other (See Comments)    Reaction to tegretol - loopy  . Clonazepam Other (See Comments)    Unknown reaction  . Gemfibrozil Other (See Comments)    Patient is not aware of this allergy but states that she has side effects to a cholesterol medication in the past  . Macrobid [Nitrofurantoin] Nausea And Vomiting  . Oxycodone Other (See Comments)    hallucinations    Patient Measurements: Height: 5\' 5"  (165.1 cm) Weight: 233 lb 4 oz (105.8 kg) IBW/kg (Calculated) : 57 Heparin Dosing Weight: 81 kg  Vital Signs: Temp: 99.4 F (37.4 C) (05/08 2048) Temp Source: Oral (05/08 2048) BP: 197/57 mmHg (05/08 1900) Pulse Rate: 77 (05/08 1900)  Labs:  Recent Labs  09/30/15 2140 10/01/15 0413 10/01/15 1455 10/01/15 1456 10/01/15 2212 10/02/15 0223 10/02/15 0741 10/02/15 1229  HGB 10.6* 10.1*  --   --   --  7.5*  --  8.4*  HCT 33.5* 32.9*  --   --   --  24.5*  --  27.6*  PLT 215 183  --   --   --  120*  --   --   HEPARINUNFRC  --   --  0.39  --  0.31  --  0.39  --   CREATININE 2.17* 2.18*  --   --   --  3.01*  --   --   TROPONINI 0.12* 1.15*  --  2.13* 1.55*  --   --   --     Estimated Creatinine Clearance: 21 mL/min (by C-G formula based on Cr of 3.01).   Assessment: 70 YOF who was on IV heparin for r/o PE but this was stopped earlier due to a decrease in Hb today (7.5/8.4<<10.1).  Although patient's hemoglobin and platelet count dropped, no bleeding has been reported. Pharmacy consulted to resume IV heparin for chest pain.  Goal of Therapy:  Heparin level 0.3-0.7 units/ml Monitor platelets by anticoagulation protocol: Yes   Plan:  - Restart heparin drip at 1500 units/hr with 2400 unit IV bolus (1/2 bolus due to Hb) - 8 hr HL - Daily HL / CBC - Monitor for s/sx of bleeding - F/U VQ scan result  [facility],  Pharm.D., BCPS Clinical Pharmacist Pager: [phone] 10/02/2015 9:22 PM

## 2015-10-02 NOTE — Progress Notes (Signed)
Paged MD Sommers at [facility] regarding pt condition.  Pt with chest pain unrelieved by three doses of SL Nitro.  Pt now requiring 10L non re breather and tachypnic.  Pt also states chest pain is radiating up into jaw.  EKG done stat.  Receiving new orders.  Notified Cards fellow as well.

## 2015-10-03 ENCOUNTER — Inpatient Hospital Stay (HOSPITAL_COMMUNITY): Payer: Medicare Other

## 2015-10-03 DIAGNOSIS — E118 Type 2 diabetes mellitus with unspecified complications: Secondary | ICD-10-CM | POA: Diagnosis present

## 2015-10-03 DIAGNOSIS — J810 Acute pulmonary edema: Secondary | ICD-10-CM | POA: Diagnosis present

## 2015-10-03 DIAGNOSIS — J9601 Acute respiratory failure with hypoxia: Secondary | ICD-10-CM | POA: Diagnosis present

## 2015-10-03 DIAGNOSIS — D62 Acute posthemorrhagic anemia: Secondary | ICD-10-CM | POA: Diagnosis present

## 2015-10-03 DIAGNOSIS — I5033 Acute on chronic diastolic (congestive) heart failure: Secondary | ICD-10-CM | POA: Diagnosis present

## 2015-10-03 LAB — CBC WITH DIFFERENTIAL/PLATELET
BASOS ABS: 0.1 10*3/uL (ref 0.0–0.1)
BASOS PCT: 1 %
EOS ABS: 0.1 10*3/uL (ref 0.0–0.7)
EOS PCT: 1 %
HCT: 25 % — ABNORMAL LOW (ref 36.0–46.0)
Hemoglobin: 7.9 g/dL — ABNORMAL LOW (ref 12.0–15.0)
Lymphocytes Relative: 8 %
Lymphs Abs: 0.9 10*3/uL (ref 0.7–4.0)
MCH: 27.7 pg (ref 26.0–34.0)
MCHC: 31.6 g/dL (ref 30.0–36.0)
MCV: 87.7 fL (ref 78.0–100.0)
MONO ABS: 1 10*3/uL (ref 0.1–1.0)
Monocytes Relative: 9 %
Neutro Abs: 9.4 10*3/uL — ABNORMAL HIGH (ref 1.7–7.7)
Neutrophils Relative %: 81 %
PLATELETS: 198 10*3/uL (ref 150–400)
RBC: 2.85 MIL/uL — AB (ref 3.87–5.11)
RDW: 14.8 % (ref 11.5–15.5)
WBC: 11.4 10*3/uL — AB (ref 4.0–10.5)

## 2015-10-03 LAB — CBC
HCT: 24.7 % — ABNORMAL LOW (ref 36.0–46.0)
HEMOGLOBIN: 7.9 g/dL — AB (ref 12.0–15.0)
MCH: 28 pg (ref 26.0–34.0)
MCHC: 32 g/dL (ref 30.0–36.0)
MCV: 87.6 fL (ref 78.0–100.0)
Platelets: 168 10*3/uL (ref 150–400)
RBC: 2.82 MIL/uL — AB (ref 3.87–5.11)
RDW: 14.8 % (ref 11.5–15.5)
WBC: 10.6 10*3/uL — AB (ref 4.0–10.5)

## 2015-10-03 LAB — HEPARIN LEVEL (UNFRACTIONATED)
HEPARIN UNFRACTIONATED: 0.39 [IU]/mL (ref 0.30–0.70)
Heparin Unfractionated: 0.24 IU/mL — ABNORMAL LOW (ref 0.30–0.70)

## 2015-10-03 LAB — BASIC METABOLIC PANEL
Anion gap: 13 (ref 5–15)
BUN: 55 mg/dL — ABNORMAL HIGH (ref 6–20)
CHLORIDE: 103 mmol/L (ref 101–111)
CO2: 19 mmol/L — AB (ref 22–32)
CREATININE: 2.72 mg/dL — AB (ref 0.44–1.00)
Calcium: 7.9 mg/dL — ABNORMAL LOW (ref 8.9–10.3)
GFR calc non Af Amer: 17 mL/min — ABNORMAL LOW (ref >60)
GFR, EST AFRICAN AMERICAN: 19 mL/min — AB (ref >60)
Glucose, Bld: 190 mg/dL — ABNORMAL HIGH (ref 65–99)
Potassium: 4.3 mmol/L (ref 3.5–5.1)
Sodium: 135 mmol/L (ref 135–145)

## 2015-10-03 LAB — TROPONIN I
TROPONIN I: 0.53 ng/mL — AB (ref <0.031)
Troponin I: 0.77 ng/mL (ref <0.031)
Troponin I: 0.53 ng/mL (ref <0.031)
Troponin I: 1.41 ng/mL (ref <0.031)

## 2015-10-03 LAB — GLUCOSE, CAPILLARY
GLUCOSE-CAPILLARY: 197 mg/dL — AB (ref 65–99)
GLUCOSE-CAPILLARY: 232 mg/dL — AB (ref 65–99)
Glucose-Capillary: 161 mg/dL — ABNORMAL HIGH (ref 65–99)
Glucose-Capillary: 169 mg/dL — ABNORMAL HIGH (ref 65–99)
Glucose-Capillary: 139 mg/dL — ABNORMAL HIGH (ref 65–99)
Glucose-Capillary: 134 mg/dL — ABNORMAL HIGH (ref 65–99)

## 2015-10-03 LAB — PROTIME-INR
INR: 1.23 (ref 0.00–1.49)
PROTHROMBIN TIME: 15.7 s — AB (ref 11.6–15.2)

## 2015-10-03 LAB — COMPREHENSIVE METABOLIC PANEL
ALBUMIN: 2.5 g/dL — AB (ref 3.5–5.0)
ALK PHOS: 72 U/L (ref 38–126)
ALT: 15 U/L (ref 14–54)
AST: 18 U/L (ref 15–41)
Anion gap: 14 (ref 5–15)
BILIRUBIN TOTAL: 0.6 mg/dL (ref 0.3–1.2)
BUN: 56 mg/dL — AB (ref 6–20)
CALCIUM: 8.4 mg/dL — AB (ref 8.9–10.3)
CO2: 18 mmol/L — ABNORMAL LOW (ref 22–32)
CREATININE: 2.76 mg/dL — AB (ref 0.44–1.00)
Chloride: 105 mmol/L (ref 101–111)
GFR calc Af Amer: 19 mL/min — ABNORMAL LOW (ref >60)
GFR, EST NON AFRICAN AMERICAN: 16 mL/min — AB (ref >60)
GLUCOSE: 220 mg/dL — AB (ref 65–99)
POTASSIUM: 4.1 mmol/L (ref 3.5–5.1)
Sodium: 137 mmol/L (ref 135–145)
TOTAL PROTEIN: 6.4 g/dL — AB (ref 6.5–8.1)

## 2015-10-03 LAB — URINE CULTURE: Culture: NO GROWTH

## 2015-10-03 LAB — PROCALCITONIN: Procalcitonin: 0.62 ng/mL

## 2015-10-03 LAB — POCT I-STAT 3, ART BLOOD GAS (G3+)
ACID-BASE DEFICIT: 8 mmol/L — AB (ref 0.0–2.0)
BICARBONATE: 17.6 meq/L — AB (ref 20.0–24.0)
O2 SAT: 97 %
PH ART: 7.302 — AB (ref 7.350–7.450)
PO2 ART: 95 mmHg (ref 80.0–100.0)
TCO2: 19 mmol/L (ref 0–100)
pCO2 arterial: 35.8 mmHg (ref 35.0–45.0)

## 2015-10-03 LAB — PREPARE RBC (CROSSMATCH)

## 2015-10-03 LAB — MAGNESIUM: MAGNESIUM: 2 mg/dL (ref 1.7–2.4)

## 2015-10-03 MED ORDER — HYDRALAZINE HCL 20 MG/ML IJ SOLN
5.0000 mg | INTRAMUSCULAR | Status: DC | PRN
  Administered 2015-10-04: 5 mg via INTRAVENOUS
  Filled 2015-10-03: qty 1

## 2015-10-03 MED ORDER — SODIUM CHLORIDE 0.9 % IV SOLN: Freq: Once | INTRAVENOUS | Status: DC

## 2015-10-03 MED ORDER — DILTIAZEM LOAD VIA INFUSION
10.0000 mg | Freq: Once | INTRAVENOUS | Status: AC
  Administered 2015-10-03: 10 mg via INTRAVENOUS
  Filled 2015-10-03: qty 10

## 2015-10-03 MED ORDER — INSULIN ASPART 100 UNIT/ML ~~LOC~~ SOLN
0.0000 [IU] | SUBCUTANEOUS | Status: DC
  Administered 2015-10-03: 3 [IU] via SUBCUTANEOUS
  Administered 2015-10-03 (×2): 2 [IU] via SUBCUTANEOUS
  Administered 2015-10-04: 5 [IU] via SUBCUTANEOUS
  Administered 2015-10-04 (×3): 3 [IU] via SUBCUTANEOUS
  Administered 2015-10-04: 2 [IU] via SUBCUTANEOUS
  Administered 2015-10-05: 3 [IU] via SUBCUTANEOUS
  Administered 2015-10-05: 2 [IU] via SUBCUTANEOUS
  Administered 2015-10-05: 3 [IU] via SUBCUTANEOUS
  Administered 2015-10-05 – 2015-10-06 (×4): 2 [IU] via SUBCUTANEOUS

## 2015-10-03 MED ORDER — LORATADINE 10 MG PO TABS
10.0000 mg | ORAL_TABLET | Freq: Every day | ORAL | Status: DC
  Administered 2015-10-03 – 2015-10-18 (×16): 10 mg via ORAL
  Filled 2015-10-03 (×16): qty 1

## 2015-10-03 MED ORDER — DILTIAZEM HCL 100 MG IV SOLR
5.0000 mg/h | INTRAVENOUS | Status: DC
  Filled 2015-10-03: qty 100

## 2015-10-03 MED ORDER — DILTIAZEM HCL 100 MG IV SOLR
5.0000 mg/h | INTRAVENOUS | Status: DC
  Administered 2015-10-03: 5 mg/h via INTRAVENOUS

## 2015-10-03 NOTE — Progress Notes (Signed)
ANTICOAGULATION CONSULT NOTE - Follow Up Consult  Pharmacy Consult:  Heparin Indication: chest pain/ACS  Allergies  Allergen Reactions  . Carbamazepine Other (See Comments)    Reaction to tegretol - loopy  . Clonazepam Other (See Comments)    Unknown reaction  . Gemfibrozil Other (See Comments)    Patient is not aware of this allergy but states that she has side effects to a cholesterol medication in the past  . Macrobid [Nitrofurantoin] Nausea And Vomiting  . Oxycodone Other (See Comments)    hallucinations    Patient Measurements: Height: 5\' 5"  (165.1 cm) Weight: 235 lb 0.2 oz (106.6 kg) IBW/kg (Calculated) : 57 Heparin Dosing Weight: 81 kg  Vital Signs: Temp: 99.2 F (37.3 C) (05/09 0440) Temp Source: Oral (05/09 0440) BP: 154/49 mmHg (05/09 0605) Pulse Rate: 67 (05/09 0605)  Labs:  Recent Labs  10/01/15 0413  10/01/15 2212 10/02/15 0223 10/02/15 0741 10/02/15 1229 10/02/15 2130 10/03/15 0239 10/03/15 0622  HGB 10.1*  --   --  7.5*  --  8.4*  --   --  7.9*  HCT 32.9*  --   --  24.5*  --  27.6*  --   --  24.7*  PLT 183  --   --  120*  --   --   --   --  168  HEPARINUNFRC  --   < > 0.31  --  0.39  --   --   --  0.24*  CREATININE 2.18*  --   --  3.01*  --   --   --  2.72*  --   TROPONINI 1.15*  < > 1.55*  --   --   --  0.66* 0.77*  --   < > = values in this interval not displayed.  Estimated Creatinine Clearance: 23.3 mL/min (by C-G formula based on Cr of 2.72).   Assessment: 41 YOF on heparin for CP, r/o PE. Heparin level down to 0.24 (subtherapeutic) on 1500 units/hr. Hgb 7.9, low but stable, plt 168. No issues with line or bleeding reported per RN.  Goal of Therapy:  Heparin level 0.3-0.7 units/ml Monitor platelets by anticoagulation protocol: Yes   Plan:  - Increase heparin drip at 1650 units/hr  - 8 hr HL - Daily HL / CBC - Monitor for s/sx of bleeding - F/U VQ scan result  Sherlon Handing, PharmD, BCPS Clinical pharmacist, pager  [phone] 10/03/2015 7:04 AM

## 2015-10-03 NOTE — Progress Notes (Signed)
MD spoke to patient. Cardizem drip started. Pain is "consistent, but not increasing". Family at the bedside

## 2015-10-03 NOTE — Progress Notes (Signed)
[place] TEAM 1 - Stepdown/ICU TEAM Progress Note  Briana [name] [id number] DOB: 1946-03-29 DOA: 09/30/2015 PCP: Briana Fairy, PA-C  Admit HPI / Brief Narrative: [name] is an 70 y.o. WF PMHx Depression with AnxietyDM Type 2 with complication (neuropathy), CAD native artery, PVD, Chronic Diastolic CHF, Heart murmur, OSA, HTN, HLD,Adenomatous colon polyp, CKD   Who presented to the ED at [facility] on the evening of 5/6 with 24 hours of chest pain. In ED found to have: hypoxia, a fever to 101.6, WBC of 15.5, EKG changes (lateral ST depression), mild troponin elevation (0.12). BNP was elevated to 894.0, previously was 391. She had a CXR which showed alveolar opacities, most dense in the Rt base. Cultures were obtained, she was started on CAP coverage, and due to the hypoxia was transferred to [facility] for tx of pneumonia.    HPI/Subjective: 5/9 A/O 4, positive acute respiratory distress. States Base weight is 102 kg (225 pounds). Does not weigh herself daily. States prior to admission no previous episode of A. fib with RVR.    Assessment/Plan: Acute hypoxic Respiratory Failure in setting of bilateral pulmonary infiltrates. PNA vs Pulmonary Edema -continue current antibiotics  R/o PE; although think we have fairly good explanation for her hypoxia  -LE dopplers neg -Obtain VQ scan once A. fib RVR controlled  Possible CAP -Being treated with typical CAP coverage; patient is arctic complete 3 days of antibiotics, complete 5 day course  CAD native artery  Acute on chronic diastolic HF -Hold all anticoagulants secondary to possible GI bleed; -Start Diltiazem gtt  -5/9 Transfuse 1 unit PRBC  A-Fib RVR/RBBB - EKG shows A. fib RVR with RBBB  HTN emergency w/ associated pulmonary edema  -Titrate nitro drip to maintain SBP100-110 (titrate diltiazem up to maintain HR<100)   Acute on CKD (baseline Cr~1.6 -2 ) -Renal US for RAS Tend I&O  Acute blood loss anemia? -Trend  H/H QID -Occult blood -Urinalysis  Leukocytosis -Resolved P:   DM Type 2 controlled with complication -123456 hemoglobin A1c= 6.9 - -NovoLog 70/3015 units BID -Increase to moderate SSI  Depression -Zoloft 100 mg daily    Code Status: FULL Family Communication: Briana [name] present at time of exam Disposition Plan: Resolution of A. fib RVR    Consultants: Dr.Rakesh [name] PC CM    Procedure/Significant Events: CXR -- alveolar filling pattern, most dense in RLL. EKG - lateral ST depression, TWI throughout 5/7 Bilat LE Doppler; Negative DVT/SVT 5/8 echocardiogram;Left ventricle: mild focal basal hypertrophy of the septum. 55% to 60%. -(grade 3 diastolic dysfunction).-Left atrium: severely dilated. - Tricuspid valve: moderate regurgitation. - Pulmonary arteries: PA peak pressure: 69 mm Hg (S). 5/9 Transfuse 1 unit PRBC  Culture Blood 5/6 left arm/right hand NGTD Urine 5/6 negative final 5/7 MRSA by PCR negative  Antibiotics: Azithromycin 5/6 >> Ceftriaxone 5/6 >>  DVT prophylaxis: Heparin drip  Devices    LINES / TUBES:     Continuous Infusions: . heparin 1,650 Units/hr (10/03/15 0714)    Objective: VITAL SIGNS: Temp: 98.8 F (37.1 C) (05/09 2045) Temp Source: Oral (05/09 2045) BP: 170/57 mmHg (05/09 2022) Pulse Rate: 71 (05/09 2045) SPO2; FIO2:   Intake/Output Summary (Last 24 hours) at 10/03/15 2107 Last data filed at 10/03/15 2000  Gross per 24 hour  Intake 774.29 ml  Output   1810 ml  Net -1035.71 ml     Exam: General: A/O 4, positive acute respiratory distress Eyes: negative scleral hemorrhage, negative icterus ENT: Negative Runny nose, negative  gingival bleeding, Neck:  Negative scars, masses, torticollis, lymphadenopathy, JVD Lungs: Clear to auscultation on the right with positive LLL crackles and diminished breath sounds Cardiovascular: Irregular irregular rhythm and rate, negative murmur gallop or rub normal S1 and S2 Abdomen:  Morbidly Obese, negative abdominal pain, nondistended, positive soft, bowel sounds, no rebound, no ascites, no appreciable mass Extremities: No significant cyanosis, clubbing, or edema bilateral lower extremities Psychiatric:  Negative depression, negative anxiety, negative fatigue, negative mania  Neurologic:  Cranial nerves II through XII intact, tongue/uvula midline, all extremities muscle strength 5/5, sensation intact throughout, negative dysarthria, negative expressive aphasia, negative receptive aphasia.    Data Reviewed: Basic Metabolic Panel:  Recent Labs Lab 09/30/15 2140 10/01/15 0413 10/02/15 0223 10/03/15 0239 10/03/15 1030  NA 136 137 134* 135 137  K 4.4 4.7 4.5 4.3 4.1  CL 105 107 105 103 105  CO2 20* 19* 17* 19* 18*  GLUCOSE 302* 242* 212* 190* 220*  BUN 42* 38* 49* 55* 56*  CREATININE 2.17* 2.18* 3.01* 2.72* 2.76*  CALCIUM 9.0 9.0 7.9* 7.9* 8.4*  MG  --  2.1  --   --  2.0  PHOS  --  4.0  --   --   --    Liver Function Tests:  Recent Labs Lab 09/30/15 2140 10/02/15 0223 10/03/15 1030  AST 16 15 18   ALT 11* 10* 15  ALKPHOS 72 55 72  BILITOT 0.7 0.4 0.6  PROT 7.4 5.6* 6.4*  ALBUMIN 3.6 2.5* 2.5*   No results for input(s): LIPASE, AMYLASE in the last 168 hours. No results for input(s): AMMONIA in the last 168 hours. CBC:  Recent Labs Lab 09/30/15 2140 10/01/15 0413 10/02/15 0223 10/02/15 1229 10/03/15 0622 10/03/15 1030  WBC 15.5* 12.3* 8.8  --  10.6* 11.4*  NEUTROABS 11.7*  --   --   --   --  9.4*  HGB 10.6* 10.1* 7.5* 8.4* 7.9* 7.9*  HCT 33.5* 32.9* 24.5* 27.6* 24.7* 25.0*  MCV 87.5 86.8 90.1  --  87.6 87.7  PLT 215 183 120*  --  168 198   Cardiac Enzymes:  Recent Labs Lab 10/01/15 2212 10/02/15 2130 10/03/15 0239 10/03/15 1030 10/03/15 1712  TROPONINI 1.55* 0.66* 0.77* 0.53*  0.53* 1.41*   BNP (last 3 results)  Recent Labs  05/31/15 1522 09/30/15 2140 10/02/15 2204  BNP 391.0* 894.0* 1262.5*    ProBNP (last 3  results) No results for input(s): PROBNP in the last 8760 hours.  CBG:  Recent Labs Lab 10/03/15 0438 10/03/15 0833 10/03/15 1053 10/03/15 1631 10/03/15 2052  GLUCAP 161* 169* 197* 139* 134*    Recent Results (from the past 240 hour(s))  Blood Culture (routine x 2)     Status: None (Preliminary result)   Collection Time: 09/30/15 10:14 PM  Result Value Ref Range Status   Specimen Description BLOOD LEFT ARM DRAWN BY RN  Final   Special Requests BOTTLES DRAWN AEROBIC AND ANAEROBIC 6CC  Final   Culture NO GROWTH 3 DAYS  Final   Report Status PENDING  Incomplete  Blood Culture (routine x 2)     Status: None (Preliminary result)   Collection Time: 09/30/15 10:19 PM  Result Value Ref Range Status   Specimen Description BLOOD RIGHT HAND  Final   Special Requests BOTTLES DRAWN AEROBIC ONLY 6CC  Final   Culture NO GROWTH 3 DAYS  Final   Report Status PENDING  Incomplete  Urine culture     Status: None   Collection  Time: 09/30/15 10:35 PM  Result Value Ref Range Status   Specimen Description URINE, CATHETERIZED  Final   Special Requests NONE  Final   Culture   Final    NO GROWTH 2 DAYS Performed at [facility]    Report Status 10/03/2015 FINAL  Final  MRSA PCR Screening     Status: None   Collection Time: 10/01/15  7:48 AM  Result Value Ref Range Status   MRSA by PCR NEGATIVE NEGATIVE Final    Comment:        The GeneXpert MRSA Assay (FDA approved for NASAL specimens only), is one component of a comprehensive MRSA colonization surveillance program. It is not intended to diagnose MRSA infection nor to guide or monitor treatment for MRSA infections.      Studies:  Recent x-ray studies have been reviewed in detail by the Attending Physician  Scheduled Meds:  Scheduled Meds: . sodium chloride   Intravenous Once  . sodium chloride   Intravenous Once  . aspirin EC  81 mg Oral Daily  . atorvastatin  80 mg Oral QPM  . azithromycin  500 mg Intravenous Q24H  .  cefTRIAXone (ROCEPHIN)  IV  1 g Intravenous Q24H  . furosemide  40 mg Intravenous Daily  . hydrALAZINE  100 mg Oral Q8H  . insulin aspart  0-15 Units Subcutaneous Q4H  . insulin aspart protamine- aspart  15 Units Subcutaneous BID WC  . loratadine  10 mg Oral Daily  . metoprolol tartrate  25 mg Oral BID  . sertraline  100 mg Oral Daily    Time spent on care of this patient: 40 mins   WOODS, Geraldo Docker , MD  [facility] Office  [phone] Pager [phone]  On-Call/Text Page:      [email or web address]      password [email or web address]  If 7PM-7AM, please contact night-coverage [email or web address] Password [email or web address] 10/03/2015, 9:07 PM   LOS: 2 days   Care during the described time interval was provided by me .  I have reviewed this patient's available data, including medical history, events of note, physical examination, and all test results as part of my evaluation. I have personally reviewed and interpreted all radiology studies.   Dia Crawford, MD [phone] Pager

## 2015-10-03 NOTE — Progress Notes (Signed)
PULMONARY / CRITICAL CARE MEDICINE   Name: Briana [name] MRN: [id number] DOB: 1945-05-28    ADMISSION DATE:  09/30/2015  CHIEF COMPLAINT:  Chest pain  HISTORY OF PRESENT ILLNESS:   Briana [name] is an 70 y.o. woman with a PMHx most notable for DM2 and cardiovascular disease (CAD, PVD, as well as diastolic HF) who presented to the ED at [facility] on the evening of 5/6 with 24 hours of chest pain. In ED found to have: hypoxia, a fever to 101.6, WBC of 15.5, EKG changes (lateral ST depression), mild troponin elevation (0.12). BNP was elevated to 894.0, previously was 391. She had a CXR which showed alveolar opacities, most dense in the R base. Cultures were obtained, she was started on CAP coverage, and due to the hypoxia was transferred to [facility] for tx of pneumonia.   SUBJECTIVE:  Chest pain this am which has improved with NTG Gtt Temp 99.2 Net negative 250 cc last 24 hours Hb drop again this am to 7.9    VITAL SIGNS: BP 159/81 mmHg  Pulse 138  Temp(Src) 99.2 F (37.3 C) (Oral)  Resp 31  Ht 5\' 5"  (1.651 m)  Wt 235 lb 0.2 oz (106.6 kg)  BMI 39.11 kg/m2  SpO2 97% 100% NRB HEMODYNAMICS:    VENTILATOR SETTINGS:    INTAKE / OUTPUT: I/O last 3 completed shifts: In: 1271.1 [I.V.:671.1; IV Piggyback:600] Out: 2175 [Urine:2175]  PHYSICAL EXAMINATION: General:  Elderly woman on 100% NRB, anxious Neuro:  Grossly normal motor , alert, interactive HEENT:  MMM Cardiovascular:  Heart sounds dual and normal. Lungs: Crackles noted per bases; accessory muscle use noted Abdomen:  Obese, soft Musculoskeletal:  No deformities Skin:  No rashes.  LABS:  BMET  Recent Labs Lab 10/01/15 0413 10/02/15 0223 10/03/15 0239  NA 137 134* 135  K 4.7 4.5 4.3  CL 107 105 103  CO2 19* 17* 19*  BUN 38* 49* 55*  CREATININE 2.18* 3.01* 2.72*  GLUCOSE 242* 212* 190*    Electrolytes  Recent Labs Lab 10/01/15 0413 10/02/15 0223 10/03/15 0239  CALCIUM 9.0 7.9* 7.9*  MG 2.1  --    --   PHOS 4.0  --   --     CBC  Recent Labs Lab 10/01/15 0413 10/02/15 0223 10/02/15 1229 10/03/15 0622  WBC 12.3* 8.8  --  10.6*  HGB 10.1* 7.5* 8.4* 7.9*  HCT 32.9* 24.5* 27.6* 24.7*  PLT 183 120*  --  168    Coag's No results for input(s): APTT, INR in the last 168 hours.  Sepsis Markers  Recent Labs Lab 09/30/15 2210 10/01/15 0413 10/01/15 0815 10/02/15 0223 10/03/15 0239  LATICACIDVEN 1.68  --  0.8  --   --   PROCALCITON  --  0.18  --  0.69 0.62    ABG  Recent Labs Lab 09/30/15 2245 10/01/15 0421  PHART 7.27* 7.628*  PCO2ART 44.9 17.6*  PO2ART 48.6* 138.0*    Liver Enzymes  Recent Labs Lab 09/30/15 2140 10/02/15 0223  AST 16 15  ALT 11* 10*  ALKPHOS 72 55  BILITOT 0.7 0.4  ALBUMIN 3.6 2.5*    Cardiac Enzymes  Recent Labs Lab 10/01/15 2212 10/02/15 2130 10/03/15 0239  TROPONINI 1.55* 0.66* 0.77*    Glucose  Recent Labs Lab 10/02/15 0819 10/02/15 1101 10/02/15 1602 10/02/15 2043 10/03/15 0045 10/03/15 0438  GLUCAP 154* 229* 212* 191* 232* 161*    Imaging Dg Chest Port 1 View  10/02/2015  CLINICAL DATA:  Acute respiratory failure. EXAM: PORTABLE CHEST 1 VIEW COMPARISON:  10/01/2015 FINDINGS: Airspace opacities persist throughout the central and basilar regions of both lungs, without significant interval change. No pneumothorax. Unchanged cardiomegaly. IMPRESSION: No significant interval change in the bilateral airspace opacities. Electronically Signed   By: Andreas Newport M.D.   On: 10/02/2015 23:10      STUDIES:  CXR -5/9:  L basCXR w/ Patchy airspace Dz bilaterally, small L pleural effusion,ilar atelectasis/infiltrate EKG -5/9 a-fib with RVR, RBBB,T-wave abnormality  CULTURES: Blood 5/6 x2 >>ng Urine 5/6 >>  ANTIBIOTICS: Azithromycin 5/6 >> Ceftriaxone 5/6 >>  SIGNIFICANT EVENTS: Transferred to [facility] MICU for hyxpoxia 5/9/>>A-Fib w/ RVR   LINES/TUBES: None  DISCUSSION: 70 year old female w/ acute hypoxic  resp failure in setting of acute onset of diffuse pulmonary infiltrates. Her story is more c/w cardiac than pneumonia. She had been w/out her lasix for at least 3d prior. We are continuing to treat for possible PE; although I think we have a good explanation for her hypoxia by looking at her CXR. Episode of A-fib with RVR  5/9, managed by [facility] and Cards.  ASSESSMENT / PLAN:  PULMONARY A: Acute hypoxic Respiratory Failure in setting of bilateral pulmonary infiltrates. Favor Pulmonary Edema over PNA R/o PNA R/o PE; although think we have fairly good explanation for her hypoxia  LE dopplers neg P:    See ID section--> will cont CAP coverage for now but w/ nml lactate and story not c/w infection likely not needed.  Will be cautious with fluids Gentle diuresis Maintain negative balance VQ scan when able Trend daily CXR   CARDIOVASCULAR A:  Hx of CAD Acute on chronic diastolic HF HTN emergency w/ associated pulmonary edema  EKG changes  afib w/ RVR Troponin 0.77 5/9  ( up trending)  P:  Appreciate cards assist Scheduled lasix Keep statin Heparin gtt per cards Add home lopressor and hydralazine  Prn Lopressor for rate control Off labetolol gtt  Cont tele   RENAL A:   Acute on Chronic renal insuffiencey: Scr rising Metabolic acidosis  P:   KVO IVFs Renal US for RAS>> pending Tend I&O Trend BUN/Creat  GASTROINTESTINAL A:   No active issues P:    HEMATOLOGIC A:   Leukocytosis Cultures pending Low grade temp Anemia/ HGB drop/ no obvious source of bleeding, but heparin gtt( INR: 0.97) P:  Trend WBC and fever curve daily CBC daily Coags  Transfuse for Hgb<7  INFECTIOUS A:   Possible CAP Worsening CXR P:   Being treated with Zithromax and Rocephin Follow Cultures/ Sensitivities when available Trend WBC   ENDOCRINE A:   DM2 -   With hyperglycemia  P:   Cut home insulin in 1/2. Adjust as necessary. Presently NPO. CBG's Q4  NEUROLOGIC A:    Depression Anxious P:   Continue home Zoloft dose   FAMILY  - Updates: Family at bedside  - Inter-disciplinary family meet or Palliative Care meeting due by: 5/12  - Care assumed by Eek this am 10/03/15  10/03/2015 Magdalen Spatz, AGACNP-BC [place] Pager [phone]

## 2015-10-03 NOTE — Progress Notes (Signed)
Inpatient Diabetes Program Recommendations  AACE/ADA: New Consensus Statement on Inpatient Glycemic Control (2015)  Target Ranges:  Prepandial:   less than 140 mg/dL      Peak postprandial:   less than 180 mg/dL (1-2 hours)      Critically ill patients:  140 - 180 mg/dL   Results for Briana [name], Briana [name] (MRN [id number]) as of 10/03/2015 09:35  Ref. Range 10/02/2015 08:19 10/02/2015 11:01 10/02/2015 16:02 10/02/2015 20:43 10/03/2015 00:45 10/03/2015 04:38 10/03/2015 08:33  Glucose-Capillary Latest Ref Range: 65-99 mg/dL 154 (H) 229 (H) 212 (H) 191 (H) 232 (H) 161 (H) 169 (H)   Review of Glycemic Control  Diabetes history: DM 2 Outpatient Diabetes medications: 70/30 30 units BID, Januvia 25 Daily Current orders for Inpatient glycemic control: 70/30 15 units BID, Novolog Sensitive Q4hrs  Inpatient Diabetes Program Recommendations: Insulin - Basal: Glucose mostly in the 190-200 range yesterday. Please consider increasing 70/30 to 18 units BID. Patient takes higher doses at home 30 units BID.  Thanks,  Tama Headings RN, MSN, [facility] Inpatient Diabetes Coordinator Team Pager [phone] (8a-5p)

## 2015-10-03 NOTE — Progress Notes (Signed)
Pharmacy Antibiotic Note  Briana [name] is a 70 y.o. female admitted on 09/30/2015 with chest pain.  Pharmacy has been consulted for Rocephin and azithromycin dosing.  Patient is afebrile and his WBC increased slightly.  Plan: - Continue CTX 1gm IV Q24H - Continue azithromycin 500mg  IV Q24H - Pharmacy will sign off of abx dosing as dosage adjustment is unnecessary.  Thank you for the consults.   Height: 5\' 5"  (165.1 cm) Weight: 235 lb 0.2 oz (106.6 kg) IBW/kg (Calculated) : 57  Temp (24hrs), Avg:99.3 F (37.4 C), Min:99 F (37.2 C), Max:99.7 F (37.6 C)   Recent Labs Lab 09/30/15 2140 09/30/15 2210 10/01/15 0413 10/01/15 0815 10/02/15 0223 10/03/15 0239 10/03/15 0622 10/03/15 1030  WBC 15.5*  --  12.3*  --  8.8  --  10.6* 11.4*  CREATININE 2.17*  --  2.18*  --  3.01* 2.72*  --  2.76*  LATICACIDVEN  --  1.68  --  0.8  --   --   --   --     Estimated Creatinine Clearance: 23 mL/min (by C-G formula based on Cr of 2.76).    Allergies  Allergen Reactions  . Carbamazepine Other (See Comments)    Reaction to tegretol - loopy  . Clonazepam Other (See Comments)    Unknown reaction  . Gemfibrozil Other (See Comments)    Patient is not aware of this allergy but states that she has side effects to a cholesterol medication in the past  . Macrobid [Nitrofurantoin] Nausea And Vomiting  . Oxycodone Other (See Comments)    hallucinations    Antimicrobials this admission: CTX 5/6 >> Azith 5/6 >>  Dose adjustments this admission: N/A  Microbiology results: 5/6 BCx >> NGTD 5/6 UCx >> negative 5/7 MRSA PCR >> neg   Lashe Oliveira D. Mina Marble, PharmD, BCPS Pager:  [phone] 10/03/2015, 1:37 PM

## 2015-10-03 NOTE — Progress Notes (Signed)
Upon am assessment pt c/o sweating and "not feeling good". Temperature of a room adjusted, iced towel applied as pt desired. Pt on Nonrebreather 15L. After few minutes pt started to complain of chest pain. Heart rhythm irregular, SVT, Tachy, Vtach noted. MD notified. EKG 12 lead done - Afib, rt BBB, HR in 130-150s. Nitro restarted. MD notified again, Cardiologist notified (waiting on response), NP at the bedside and aware. Chest Pain decreased from 10/10 to 3/10. Family at the bedside and updated. MD states "I am coming down"

## 2015-10-03 NOTE — Progress Notes (Signed)
ANTICOAGULATION CONSULT NOTE - Follow Up Consult  Pharmacy Consult:  Heparin Indication: chest pain/ACS  Allergies  Allergen Reactions  . Carbamazepine Other (See Comments)    Reaction to tegretol - loopy  . Clonazepam Other (See Comments)    Unknown reaction  . Gemfibrozil Other (See Comments)    Patient is not aware of this allergy but states that she has side effects to a cholesterol medication in the past  . Macrobid [Nitrofurantoin] Nausea And Vomiting  . Oxycodone Other (See Comments)    hallucinations    Patient Measurements: Height: 5\' 5"  (165.1 cm) Weight: 235 lb 0.2 oz (106.6 kg) IBW/kg (Calculated) : 57 Heparin Dosing Weight: 81 kg  Vital Signs: Temp: 98.6 F (37 C) (05/09 1635) Temp Source: Oral (05/09 1635) BP: 154/51 mmHg (05/09 1600) Pulse Rate: 60 (05/09 1600)  Labs:  Recent Labs  10/02/15 0223 10/02/15 0741 10/02/15 1229 10/02/15 2130 10/03/15 0239 10/03/15 0622 10/03/15 1030 10/03/15 1712  HGB 7.5*  --  8.4*  --   --  7.9* 7.9*  --   HCT 24.5*  --  27.6*  --   --  24.7* 25.0*  --   PLT 120*  --   --   --   --  168 198  --   LABPROT  --   --   --   --   --   --  15.7*  --   INR  --   --   --   --   --   --  1.23  --   HEPARINUNFRC  --  0.39  --   --   --  0.24*  --  0.39  CREATININE 3.01*  --   --   --  2.72*  --  2.76*  --   TROPONINI  --   --   --  0.66* 0.77*  --  0.53*  0.53*  --     Estimated Creatinine Clearance: 23 mL/min (by C-G formula based on Cr of 2.76).   Assessment: 36 YOF on heparin for CP, r/o PE. Heparin level down to 0.24 (subtherapeutic) on 1500 units/hr. Hgb 7.9, low but stable, plt 168. No issues with line or bleeding reported per RN.  Repeat HL is now therapeutic at 0.39 on heparin 1650 units/hr. No issues with infusion or bleeding noted.  Goal of Therapy:  Heparin level 0.3-0.7 units/ml Monitor platelets by anticoagulation protocol: Yes   Plan:  Continue heparin at 1650 units/hr  Daily HL / CBC Monitor for  s/sx of bleeding  Andrey Cota. Diona Foley, PharmD, [place] Clinical Pharmacist Pager [phone]  10/03/2015 5:37 PM

## 2015-10-04 ENCOUNTER — Inpatient Hospital Stay (HOSPITAL_COMMUNITY): Payer: Medicare Other

## 2015-10-04 DIAGNOSIS — I5033 Acute on chronic diastolic (congestive) heart failure: Secondary | ICD-10-CM

## 2015-10-04 LAB — URINALYSIS, DIPSTICK ONLY
Bilirubin Urine: NEGATIVE
GLUCOSE, UA: NEGATIVE mg/dL
HGB URINE DIPSTICK: NEGATIVE
KETONES UR: NEGATIVE mg/dL
Leukocytes, UA: NEGATIVE
Nitrite: NEGATIVE
PH: 5.5 (ref 5.0–8.0)
Protein, ur: 100 mg/dL — AB
Specific Gravity, Urine: 1.014 (ref 1.005–1.030)

## 2015-10-04 LAB — CBC
HCT: 25.9 % — ABNORMAL LOW (ref 36.0–46.0)
HCT: 29 % — ABNORMAL LOW (ref 36.0–46.0)
Hemoglobin: 8.4 g/dL — ABNORMAL LOW (ref 12.0–15.0)
Hemoglobin: 9.2 g/dL — ABNORMAL LOW (ref 12.0–15.0)
MCH: 27.8 pg (ref 26.0–34.0)
MCH: 26.7 pg (ref 26.0–34.0)
MCHC: 32.4 g/dL (ref 30.0–36.0)
MCHC: 31.7 g/dL (ref 30.0–36.0)
MCV: 85.8 fL (ref 78.0–100.0)
MCV: 84.3 fL (ref 78.0–100.0)
PLATELETS: 190 10*3/uL (ref 150–400)
PLATELETS: 223 10*3/uL (ref 150–400)
RBC: 3.02 MIL/uL — AB (ref 3.87–5.11)
RBC: 3.44 MIL/uL — ABNORMAL LOW (ref 3.87–5.11)
RDW: 15.4 % (ref 11.5–15.5)
RDW: 15.3 % (ref 11.5–15.5)
WBC: 10.4 10*3/uL (ref 4.0–10.5)
WBC: 10.3 10*3/uL (ref 4.0–10.5)

## 2015-10-04 LAB — GLUCOSE, CAPILLARY
GLUCOSE-CAPILLARY: 130 mg/dL — AB (ref 65–99)
GLUCOSE-CAPILLARY: 160 mg/dL — AB (ref 65–99)
GLUCOSE-CAPILLARY: 212 mg/dL — AB (ref 65–99)
Glucose-Capillary: 118 mg/dL — ABNORMAL HIGH (ref 65–99)
Glucose-Capillary: 182 mg/dL — ABNORMAL HIGH (ref 65–99)
Glucose-Capillary: 190 mg/dL — ABNORMAL HIGH (ref 65–99)

## 2015-10-04 LAB — TROPONIN I
TROPONIN I: 0.86 ng/mL — AB (ref <0.031)
TROPONIN I: 0.73 ng/mL — AB (ref <0.031)
Troponin I: 1.19 ng/mL (ref <0.031)
Troponin I: 0.6 ng/mL (ref <0.031)

## 2015-10-04 LAB — TYPE AND SCREEN
ABO/RH(D): A POS
Antibody Screen: NEGATIVE
UNIT DIVISION: 0

## 2015-10-04 LAB — BLOOD GAS, ARTERIAL
Acid-base deficit: 5.6 mmol/L — ABNORMAL HIGH (ref 0.0–2.0)
Bicarbonate: 18.8 mEq/L — ABNORMAL LOW (ref 20.0–24.0)
Delivery systems: POSITIVE
Drawn by: 449561
Expiratory PAP: 6
FIO2: 0.6
INSPIRATORY PAP: 12
LHR: 12 {breaths}/min
O2 Saturation: 96.9 %
Patient temperature: 98.6
TCO2: 19.8 mmol/L (ref 0–100)
pCO2 arterial: 33.4 mmHg — ABNORMAL LOW (ref 35.0–45.0)
pH, Arterial: 7.368 (ref 7.350–7.450)
pO2, Arterial: 84.5 mmHg (ref 80.0–100.0)

## 2015-10-04 LAB — POCT I-STAT 3, ART BLOOD GAS (G3+)
ACID-BASE DEFICIT: 8 mmol/L — AB (ref 0.0–2.0)
Bicarbonate: 17.4 mEq/L — ABNORMAL LOW (ref 20.0–24.0)
O2 SAT: 87 %
PCO2 ART: 37.2 mmHg (ref 35.0–45.0)
PH ART: 7.281 — AB (ref 7.350–7.450)
TCO2: 18 mmol/L (ref 0–100)
pO2, Arterial: 62 mmHg — ABNORMAL LOW (ref 80.0–100.0)

## 2015-10-04 LAB — HEMOGLOBIN AND HEMATOCRIT, BLOOD
HCT: 28.8 % — ABNORMAL LOW (ref 36.0–46.0)
HCT: 26 % — ABNORMAL LOW (ref 36.0–46.0)
HEMOGLOBIN: 8.3 g/dL — AB (ref 12.0–15.0)
Hemoglobin: 9.1 g/dL — ABNORMAL LOW (ref 12.0–15.0)

## 2015-10-04 LAB — OCCULT BLOOD X 1 CARD TO LAB, STOOL: FECAL OCCULT BLD: NEGATIVE

## 2015-10-04 LAB — HEPARIN LEVEL (UNFRACTIONATED)
HEPARIN UNFRACTIONATED: 0.31 [IU]/mL (ref 0.30–0.70)
Heparin Unfractionated: 0.24 IU/mL — ABNORMAL LOW (ref 0.30–0.70)

## 2015-10-04 LAB — TSH: TSH: 1.354 u[IU]/mL (ref 0.350–4.500)

## 2015-10-04 MED ORDER — HEPARIN (PORCINE) IN NACL 100-0.45 UNIT/ML-% IJ SOLN
1600.0000 [IU]/h | INTRAMUSCULAR | Status: DC
  Administered 2015-10-04 – 2015-10-08 (×8): 1900 [IU]/h via INTRAVENOUS
  Administered 2015-10-09: 1600 [IU]/h via INTRAVENOUS
  Administered 2015-10-09: 1750 [IU]/h via INTRAVENOUS
  Administered 2015-10-10 – 2015-10-11 (×2): 1600 [IU]/h via INTRAVENOUS
  Filled 2015-10-04 (×23): qty 250

## 2015-10-04 MED ORDER — DILTIAZEM LOAD VIA INFUSION
20.0000 mg | Freq: Once | INTRAVENOUS | Status: AC
  Administered 2015-10-04: 20 mg via INTRAVENOUS
  Filled 2015-10-04: qty 20

## 2015-10-04 MED ORDER — METOPROLOL TARTRATE 50 MG PO TABS
50.0000 mg | ORAL_TABLET | Freq: Two times a day (BID) | ORAL | Status: DC
Start: 2015-10-04 — End: 2015-10-05
  Administered 2015-10-04: 50 mg via ORAL
  Filled 2015-10-04 (×2): qty 1

## 2015-10-04 MED ORDER — FUROSEMIDE 10 MG/ML IJ SOLN
40.0000 mg | Freq: Once | INTRAMUSCULAR | Status: AC
  Administered 2015-10-04: 40 mg via INTRAVENOUS

## 2015-10-04 MED ORDER — DILTIAZEM HCL 100 MG IV SOLR
5.0000 mg/h | INTRAVENOUS | Status: DC
  Administered 2015-10-04: 15 mg/h via INTRAVENOUS
  Administered 2015-10-04: 5 mg/h via INTRAVENOUS
  Administered 2015-10-05 (×2): 15 mg/h via INTRAVENOUS
  Filled 2015-10-04 (×5): qty 100

## 2015-10-04 MED ORDER — FUROSEMIDE 10 MG/ML IJ SOLN
INTRAMUSCULAR | Status: AC
  Administered 2015-10-04: 40 mg via INTRAVENOUS
  Filled 2015-10-04: qty 4

## 2015-10-04 MED ORDER — SODIUM CHLORIDE 0.9 % IV SOLN
INTRAVENOUS | Status: DC
  Administered 2015-10-04 – 2015-10-07 (×2): via INTRAVENOUS

## 2015-10-04 NOTE — Progress Notes (Signed)
PULMONARY / CRITICAL CARE MEDICINE   Name: Briana [name] MRN: [id number] DOB: [birth year]    ADMISSION DATE:  09/30/2015  CHIEF COMPLAINT:  Chest pain  HISTORY OF PRESENT ILLNESS:   Briana [name] is an 70 y.o. woman with a PMHx most notable for DM2 and cardiovascular disease (CAD, PVD, as well as diastolic HF) who presented to the ED at [facility] on the evening of 5/6 with 24 hours of chest pain. In ED found to have: hypoxia, a fever to 101.6, WBC of 15.5, EKG changes (lateral ST depression), mild troponin elevation (0.12). BNP was elevated to 894.0, previously was 391. She had a CXR which showed alveolar opacities, most dense in the R base. Cultures were obtained, she was started on CAP coverage, and due to the hypoxia was transferred to [facility] for tx of pneumonia.   SUBJECTIVE:  Afebrile Dyspneic after pRBC requiring bipap Good UO with lasix    VITAL SIGNS: BP 144/92 mmHg  Pulse 69  Temp(Src) 98.9 F (37.2 C) (Axillary)  Resp 26  Ht 5\' 5"  (1.651 m)  Wt 232 lb 9.4 oz (105.5 kg)  BMI 38.70 kg/m2  SpO2 93% 100% NRB HEMODYNAMICS:    VENTILATOR SETTINGS: Vent Mode:  [-]  FiO2 (%):  [50 %-55 %] 55 %  INTAKE / OUTPUT: I/O last 3 completed shifts: In: 1740.8 [I.V.:805.8; Blood:335; IV [id number] Out: 2760 [Urine:2760]  PHYSICAL EXAMINATION: General:  Elderly woman on bipap, anxious Neuro:  Grossly normal motor , alert, interactive HEENT:  MMM Cardiovascular:  Heart sounds dual and normal. Lungs: Crackles noted per bases; accessory muscle use noted Abdomen:  Obese, soft Musculoskeletal:  No deformities Skin:  No rashes.  LABS:  BMET  Recent Labs Lab 10/02/15 0223 10/03/15 0239 10/03/15 1030  NA 134* 135 137  K 4.5 4.3 4.1  CL 105 103 105  CO2 17* 19* 18*  BUN 49* 55* 56*  CREATININE 3.01* 2.72* 2.76*  GLUCOSE 212* 190* 220*    Electrolytes  Recent Labs Lab 10/01/15 0413 10/02/15 0223 10/03/15 0239 10/03/15 1030  CALCIUM 9.0 7.9* 7.9* 8.4*   MG 2.1  --   --  2.0  PHOS 4.0  --   --   --     CBC  Recent Labs Lab 10/03/15 0622 10/03/15 1030 10/04/15 0121 10/04/15 0601 10/04/15 0808  WBC 10.6* 11.4*  --  10.4  --   HGB 7.9* 7.9* 9.1* 8.4* 8.3*  HCT 24.7* 25.0* 28.8* 25.9* 26.0*  PLT 168 198  --  190  --     Coag's  Recent Labs Lab 10/03/15 1030  INR 1.23    Sepsis Markers  Recent Labs Lab 09/30/15 2210 10/01/15 0413 10/01/15 0815 10/02/15 0223 10/03/15 0239  LATICACIDVEN 1.68  --  0.8  --   --   PROCALCITON  --  0.18  --  0.69 0.62    ABG  Recent Labs Lab 10/03/15 0852 10/04/15 0113 10/04/15 0554  PHART 7.302* 7.281* 7.368  PCO2ART 35.8 37.2 33.4*  PO2ART 95.0 62.0* 84.5    Liver Enzymes  Recent Labs Lab 09/30/15 2140 10/02/15 0223 10/03/15 1030  AST 16 15 18   ALT 11* 10* 15  ALKPHOS 72 55 72  BILITOT 0.7 0.4 0.6  ALBUMIN 3.6 2.5* 2.5*    Cardiac Enzymes  Recent Labs Lab 10/03/15 1030 10/03/15 1712 10/04/15 0121  TROPONINI 0.53*  0.53* 1.41* 1.19*    Glucose  Recent Labs Lab 10/03/15 1053 10/03/15 1631 10/03/15 2052 10/04/15 0029 10/04/15  0421 10/04/15 0858  GLUCAP 197* 139* 134* 160* 130* 118*    Imaging Dg Chest Port 1 View  10/04/2015  CLINICAL DATA:  Hypoxia.  Dyspnea EXAM: PORTABLE CHEST 1 VIEW COMPARISON:  10/03/2015 FINDINGS: Airspace opacities in the central and basilar regions have worsened from the earlier study. Probable left pleural effusion. Moderate vascular and interstitial congestion. Moderate cardiomegaly. IMPRESSION: The findings likely represent congestive heart failure. Infectious infiltrates cannot be excluded. Electronically Signed   By: Andreas Newport M.D.   On: 10/04/2015 01:39      STUDIES:  Echo 5/8 nml LVEF, gr 3 DD, RVSP 69 EKG -5/9 a-fib with RVR, RBBB,T-wave abnormality  CULTURES: Blood 5/6 x2 >>ng Urine 5/6 >>ng  ANTIBIOTICS: Azithromycin 5/6 >>5/10 Ceftriaxone 5/6 >>  SIGNIFICANT EVENTS: Transferred to [facility] MICU  for hyxpoxia 5/9/>>A-Fib w/ RVR  5/9 Chest pain ,Hb drop again this am to 7.9 5/10 bipap   LINES/TUBES: None  DISCUSSION: 70 year old female w/ acute hypoxic resp failure in setting of acute onset of diffuse pulmonary infiltrates. Her story is more c/w cardiac than pneumonia. She had been w/out her lasix for at least 3d prior. Doubt PE; we have a good explanation for her hypoxia. Episode of A-fib with RVR  5/9, managed by [facility] and Cards. Worsening edema after pRBC transfusion 5/9  ASSESSMENT / PLAN:  PULMONARY A: Acute hypoxic Respiratory Failure in setting of bilateral pulmonary infiltrates. Favor acute Pulmonary Edema over PNA LE dopplers neg P:   Bipap prn VQ scan will not be helpful here    CARDIOVASCULAR A:  Hx of CAD Acute on chronic diastolic HF HTN emergency w/ associated pulmonary edema -Off labetolol gtt  EKG changes  afib w/ RVR Troponin peak 1.4  P:  Appreciate cards assist Scheduled lasix Keep statin Heparin gtt per cards Ct  home lopressor and hydralazine  Prn Lopressor for rate control   RENAL A:   Acute on Chronic renal insuffiencey: Scr rising Metabolic acidosis - resolved P:   Renal US for RAS>> pending Tend I&O Trend BUN/Creat  GASTROINTESTINAL A:   No active issues P:    HEMATOLOGIC A:   Leukocytosis Low grade temp Anemia/ HGB drop/ no obvious source of bleeding, but heparin gtt( INR: 0.97) P:  Trend WBC and fever curve Transfuse for Hgb<7  INFECTIOUS A:   Possible CAP Worsening CXR , likely due to fluid P:   dc Zithroma, ct Rocephin x 7ds    ENDOCRINE A:   DM2 -   With hyperglycemia  P:   Cut home insulin in 1/2.  CBG's Q4  NEUROLOGIC A:   Depression Anxious P:   Continue home Zoloft dose   FAMILY  - Updates: Family at bedside  - Inter-disciplinary family meet or Palliative Care meeting due by: 5/12  Summary - She had rising creatinine-and unfortunately this precludes ischemic workup or CT angiogram .  Probability for PE appears to be very low given bilateral infiltrates, alternative causes for hypoxia and negative Doppler  PCCM to stay as consult Can transfer to SDU  Kara Mead MD. FCCP. [place] [facility] Pager [phone] If no response call [phone]   10/04/2015    10/04/2015

## 2015-10-04 NOTE — Progress Notes (Signed)
Per RN, MD requested pt trial off bipap.  Pt placed on 55% VM, sat 93%, no distress noted. Pt denies SOB.

## 2015-10-04 NOTE — Plan of Care (Signed)
Problem: ICU Phase Progression Outcomes Goal: O2 sats trending toward baseline Outcome: Progressing Pt O2 demand reduced from 15L (yesterday) to 6 L Goal: Dyspnea controlled at rest Outcome: Progressing Dyspnea is getting better with movements Goal: Pain controlled with appropriate interventions Outcome: Progressing Pain controlled with medications  Problem: Tissue Perfusion: Goal: Risk factors for ineffective tissue perfusion will decrease Outcome: Progressing Heparin drip

## 2015-10-04 NOTE — Progress Notes (Signed)
ANTICOAGULATION CONSULT NOTE - Follow Up Consult  Pharmacy Consult:  Heparin Indication: chest pain/ACS  Allergies  Allergen Reactions  . Carbamazepine Other (See Comments)    Reaction to tegretol - loopy  . Clonazepam Other (See Comments)    Unknown reaction  . Gemfibrozil Other (See Comments)    Patient is not aware of this allergy but states that she has side effects to a cholesterol medication in the past  . Macrobid [Nitrofurantoin] Nausea And Vomiting  . Oxycodone Other (See Comments)    hallucinations    Patient Measurements: Height: 5\' 5"  (165.1 cm) Weight: 232 lb 9.4 oz (105.5 kg) IBW/kg (Calculated) : 57 Heparin Dosing Weight: 81 kg  Vital Signs: Temp: 99.2 F (37.3 C) (05/10 1602) Temp Source: Oral (05/10 1602) BP: 144/73 mmHg (05/10 1615) Pulse Rate: 113 (05/10 1615)  Labs:  Recent Labs  10/02/15 0223  10/03/15 0239  10/03/15 1030 10/03/15 1712 10/04/15 0121 10/04/15 0601 10/04/15 0808 10/04/15 1059 10/04/15 1616  HGB 7.5*  < >  --   < > 7.9*  --  9.1* 8.4* 8.3*  --  9.2*  HCT 24.5*  < >  --   < > 25.0*  --  28.8* 25.9* 26.0*  --  29.0*  PLT 120*  --   --   < > 198  --   --  190  --   --  223  LABPROT  --   --   --   --  15.7*  --   --   --   --   --   --   INR  --   --   --   --  1.23  --   --   --   --   --   --   HEPARINUNFRC  --   < >  --   < >  --  0.39  --  0.24*  --   --  0.31  CREATININE 3.01*  --  2.72*  --  2.76*  --   --   --   --   --   --   TROPONINI  --   < > 0.77*  --  0.53*  0.53* 1.41* 1.19*  --   --  0.86*  --   < > = values in this interval not displayed.  Estimated Creatinine Clearance: 22.9 mL/min (by C-G formula based on Cr of 2.76).   Assessment: 28 YOF continue on IV heparin for ACS.  Heparin level sub-therapeutic this AM; no bleeding reported.  Repeat HL is now therapeutic but on the low end after increasing heparin to 1800 units/hr. Nurse reports no issues with infusion or bleeding with a little bleeding around IV  site. Will increase slightly with recent subtherapeutic levels following therapeutic levels.  Goal of Therapy:  Heparin level 0.3-0.7 units/ml Monitor platelets by anticoagulation protocol: Yes  Plan:  Increase heparin gtt to 1900 units/hr Check 8 hr HL Daily HL / CBC  Andrey Cota. Diona Foley, PharmD, [place] Clinical Pharmacist Pager [phone]  10/04/2015, 5:34 PM

## 2015-10-04 NOTE — Care Management Important Message (Signed)
Important Message  Patient Details  Name: Briana [name] MRN: [id number] Date of Birth: 06/06/1945   Medicare Important Message Given:  Yes    Shaleah Nissley Abena 10/04/2015, 1:12 PM

## 2015-10-04 NOTE — Progress Notes (Addendum)
RN paged earlier in shift secondary to pt c/o SOB. Slightly tachypneic. Pt with hx of CHF and just received i unit PRBCs. Pt placed on NRB due to hypoxia with increased O2 demand. O2 sat normal on NRB.  Lasix 40mg  IV x 1 given and RN to give update afterwards.  Update: pt remained tachypneic even after Lasix and NRB. Pt placed on bipap. ABG showed low PO2 and slight acidosis. Additional 40mg  Lasix IV given. Recheck ABG 2-3 hours after bipap started. Hgb up after transfusion.  Will follow.  [facility], NP [facility] Update: NP called and spoke with RN, Mendel Ryder, to check on pt. Pt is tolerating Bipap well and no longer tachypneic or hypoxic.  [facility], NP [facility] Update: Post Bipap ABG improved. Continue present tx plan. [facility], NP [facility]

## 2015-10-04 NOTE — Progress Notes (Signed)
[facility] TEAM 1 - Stepdown/ICU TEAM  CHASTELYN RENNARD  [id number] DOB: 1945-10-30 DOA: 09/30/2015 PCP: Antionette Fairy, PA-C    Brief Narrative:  70 y.o. F Hx Depression with Anxiety, DM2 with neuropathy, CAD, PVD, Chronic Diastolic CHF, Heart murmur, OSA, HTN, HLD, Adenomatous colon polyps, and CKD who presented to the ED at [facility] on the evening of 5/6 with 24 hours of chest pain.  In the ED she was found to have hypoxia, a fever to 101.6, WBC of 15.5, lateral ST depression on EKG, mild troponin elevation (0.12), and BNP 894.0. CXR suggested alveolar opacities, most dense in the Rt base. Cultures were obtained, she was started on CAP coverage, and due to the hypoxia she was transferred to [facility] for tx of pneumonia.  Significant Events: 5/7 Transferred to [facility] MICU for hyxpoxia 5/9 A-Fib w/ RVR  5/9 Chest pain - Hgb dropped  5/10 BIPAP   Assessment & Plan:  Acute hypoxic Respiratory Failure w/ B pulmonary infiltrates - PNA + Pulmonary Edema -continue current antibiotics to complete 7 day course   Acute on chronic severe (grade 3) diastolic HF -cont diuresis - net negative ~800cc since admit  Filed Weights   10/02/15 1000 10/03/15 0500 10/04/15 0500  Weight: 105.8 kg (233 lb 4 oz) 106.6 kg (235 lb 0.2 oz) 105.5 kg (232 lb 9.4 oz)    HTN emergency  Follow BP w/ resumption of cardizem gtt  New onset A-Fib w/ RVR / RBBB -rate poorly controlled - resume cardizem gtt - CHA2DS2 VASc score is 6+ therefore cont IV heparin for now and watch Hgb   Acute normocytic anemia of unclear etiology  -hemoccult pending - no evident acute blood loss - follow Hgb  Recent Labs Lab 10/02/15 1229 10/03/15 0622 10/03/15 1030 10/04/15 0121 10/04/15 0601 10/04/15 0808  HGB 8.4* 7.9* 7.9* 9.1* 8.4* 8.3*    R/o PE -LE dopplers neg -Obtain VQ scan once A. fib RVR controlled  CAD s/p CABG 2005 Cards has suggested outpt eval   Acute on CKD -baseline Cr ~ 1.6 - 2   Recent Labs Lab  09/30/15 2140 10/01/15 0413 10/02/15 0223 10/03/15 0239 10/03/15 1030  CREATININE 2.17* 2.18* 3.01* 2.72* 2.76*   DM2  -4/21 A1c 6.9  -CBG currently well controlled  Depression w/ anxiety  -cont zoloft   Obesity - Body mass index is 38.7 kg/(m^2).  DVT prophylaxis: heparin gtt  Code Status: FULL CODE Family Communication: spoke w/ daughter at bedside  Disposition Plan: SDU on cardizem gtt   Consultants:  PCCM [facility]   Procedures:  5/7 B LE Doppler - Negative DVT/SVT 5/8 TTE EF 55-60% - grade 3 diastolic dysfunction - Left atrium: severely dilated. - Tricuspid valve moderate regurgitation - PA peak pressure: 69 mm Hg (S)  Antimicrobials:  Azithromycin 5/6 > 5/10 Ceftriaxone 5/6 >  Subjective: Pt developed resp distress early this AM w/ blood transfusion.  She improved w/ diuresis and short term BIPAP.  She currently c/o generalized/bilateral chest pain which she has trouble describing, but says radiates into her neck B.  She denies n/v, abdom pain, or HA.   Objective: Blood pressure 144/92, pulse 69, temperature 98.9 F (37.2 C), temperature source Axillary, resp. rate 26, height 5\' 5"  (1.651 m), weight 105.5 kg (232 lb 9.4 oz), SpO2 93 %.  Intake/Output Summary (Last 24 hours) at 10/04/15 0949 Last data filed at 10/04/15 0730  Gross per 24 hour  Intake 1190.66 ml  Output   1710  ml  Net -519.34 ml   Filed Weights   10/02/15 1000 10/03/15 0500 10/04/15 0500  Weight: 105.8 kg (233 lb 4 oz) 106.6 kg (235 lb 0.2 oz) 105.5 kg (232 lb 9.4 oz)    Examination: General: No acute respiratory distress at rest presently  Lungs: distant bs th/o - no wheeze - bibasilar crackles  Cardiovascular: tachycardic at 140bpm - irreg irreg  Abdomen: Nontender, overweight, soft, bowel sounds positive, no rebound, no ascites, no appreciable mass Extremities: No significant cyanosis, or clubbing;  1+ edema bilateral lower extremities  CBC:  Recent Labs Lab 09/30/15 2140  10/01/15 0413 10/02/15 0223  10/03/15 0622 10/03/15 1030 10/04/15 0121 10/04/15 0601 10/04/15 0808  WBC 15.5* 12.3* 8.8  --  10.6* 11.4*  --  10.4  --   NEUTROABS 11.7*  --   --   --   --  9.4*  --   --   --   HGB 10.6* 10.1* 7.5*  < > 7.9* 7.9* 9.1* 8.4* 8.3*  HCT 33.5* 32.9* 24.5*  < > 24.7* 25.0* 28.8* 25.9* 26.0*  MCV 87.5 86.8 90.1  --  87.6 87.7  --  85.8  --   PLT 215 183 120*  --  168 198  --  190  --   < > = values in this interval not displayed.   Basic Metabolic Panel:  Recent Labs Lab 09/30/15 2140 10/01/15 0413 10/02/15 0223 10/03/15 0239 10/03/15 1030  NA 136 137 134* 135 137  K 4.4 4.7 4.5 4.3 4.1  CL 105 107 105 103 105  CO2 20* 19* 17* 19* 18*  GLUCOSE 302* 242* 212* 190* 220*  BUN 42* 38* 49* 55* 56*  CREATININE 2.17* 2.18* 3.01* 2.72* 2.76*  CALCIUM 9.0 9.0 7.9* 7.9* 8.4*  MG  --  2.1  --   --  2.0  PHOS  --  4.0  --   --   --    GFR: Estimated Creatinine Clearance: 22.9 mL/min (by C-G formula based on Cr of 2.76).  Liver Function Tests:  Recent Labs Lab 09/30/15 2140 10/02/15 0223 10/03/15 1030  AST 16 15 18   ALT 11* 10* 15  ALKPHOS 72 55 72  BILITOT 0.7 0.4 0.6  PROT 7.4 5.6* 6.4*  ALBUMIN 3.6 2.5* 2.5*   Coagulation Profile:  Recent Labs Lab 10/03/15 1030  INR 1.23    Cardiac Enzymes:  Recent Labs Lab 10/02/15 2130 10/03/15 0239 10/03/15 1030 10/03/15 1712 10/04/15 0121  TROPONINI 0.66* 0.77* 0.53*  0.53* 1.41* 1.19*   CBG:  Recent Labs Lab 10/03/15 1631 10/03/15 2052 10/04/15 0029 10/04/15 0421 10/04/15 0858  GLUCAP 139* 134* 160* 130* 118*    Lipid Profile: No results for input(s): CHOL, HDL, LDLCALC, TRIG, CHOLHDL, LDLDIRECT in the last 72 hours.  Thyroid Function Tests: No results for input(s): TSH, T4TOTAL, FREET4, T3FREE, THYROIDAB in the last 72 hours.  Anemia Panel: No results for input(s): VITAMINB12, FOLATE, FERRITIN, TIBC, IRON, RETICCTPCT in the last 72 hours.  Urine analysis:      Component Value Date/Time   COLORURINE YELLOW 10/04/2015 0030   APPEARANCEUR HAZY* 10/04/2015 0030   LABSPEC 1.014 10/04/2015 0030   PHURINE 5.5 10/04/2015 0030   GLUCOSEU NEGATIVE 10/04/2015 0030   HGBUR NEGATIVE 10/04/2015 0030   BILIRUBINUR NEGATIVE 10/04/2015 0030   KETONESUR NEGATIVE 10/04/2015 0030   PROTEINUR 100* 10/04/2015 0030   UROBILINOGEN 0.2 03/02/2014 1508   NITRITE NEGATIVE 10/04/2015 0030   LEUKOCYTESUR NEGATIVE 10/04/2015 0030  Recent Results (from the past 240 hour(s))  Blood Culture (routine x 2)     Status: None (Preliminary result)   Collection Time: 09/30/15 10:14 PM  Result Value Ref Range Status   Specimen Description BLOOD LEFT ARM DRAWN BY RN  Final   Special Requests BOTTLES DRAWN AEROBIC AND ANAEROBIC 6CC  Final   Culture NO GROWTH 3 DAYS  Final   Report Status PENDING  Incomplete  Blood Culture (routine x 2)     Status: None (Preliminary result)   Collection Time: 09/30/15 10:19 PM  Result Value Ref Range Status   Specimen Description BLOOD RIGHT HAND  Final   Special Requests BOTTLES DRAWN AEROBIC ONLY 6CC  Final   Culture NO GROWTH 3 DAYS  Final   Report Status PENDING  Incomplete  Urine culture     Status: None   Collection Time: 09/30/15 10:35 PM  Result Value Ref Range Status   Specimen Description URINE, CATHETERIZED  Final   Special Requests NONE  Final   Culture   Final    NO GROWTH 2 DAYS Performed at [facility]    Report Status 10/03/2015 FINAL  Final  MRSA PCR Screening     Status: None   Collection Time: 10/01/15  7:48 AM  Result Value Ref Range Status   MRSA by PCR NEGATIVE NEGATIVE Final    Comment:        The GeneXpert MRSA Assay (FDA approved for NASAL specimens only), is one component of a comprehensive MRSA colonization surveillance program. It is not intended to diagnose MRSA infection nor to guide or monitor treatment for MRSA infections.      Scheduled Meds: . sodium chloride   Intravenous Once   . sodium chloride   Intravenous Once  . aspirin EC  81 mg Oral Daily  . atorvastatin  80 mg Oral QPM  . cefTRIAXone (ROCEPHIN)  IV  1 g Intravenous Q24H  . furosemide  40 mg Intravenous Daily  . hydrALAZINE  100 mg Oral Q8H  . insulin aspart  0-15 Units Subcutaneous Q4H  . insulin aspart protamine- aspart  15 Units Subcutaneous BID WC  . loratadine  10 mg Oral Daily  . metoprolol tartrate  25 mg Oral BID  . sertraline  100 mg Oral Daily   Continuous Infusions: . heparin 1,800 Units/hr (10/04/15 [id number])     LOS: 3 days   Time spent: 35 minutes   Cherene Altes, MD [facility] Office  [phone] Pager - Text Page per Amion as per below:  On-Call/Text Page:      [email or web address]      password [email or web address]  If 7PM-7AM, please contact night-coverage [email or web address] Password [email or web address] 10/04/2015, 9:49 AM

## 2015-10-04 NOTE — Progress Notes (Signed)
Upon assessment pt seems to be doing better then yesterday. After family/daughter came in the room and started asking pt if she "sure she does not have pain, because it causes some people to die". After a while pt started to c/o chest pain.HR sustained in 130-150s MD notified, Nitro was given x3.  Cardizem drip started. HR remained sustaining in 120s-130 for a while, so drip titrated to 15 mg. Cardiology MD aware, at the bedside and spoke to pt and daughter.  At this moment, pt is resting. No distress noted. HR in 110s.

## 2015-10-04 NOTE — Progress Notes (Addendum)
ANTICOAGULATION CONSULT NOTE - Follow Up Consult  Pharmacy Consult:  Heparin Indication: chest pain/ACS  Allergies  Allergen Reactions  . Carbamazepine Other (See Comments)    Reaction to tegretol - loopy  . Clonazepam Other (See Comments)    Unknown reaction  . Gemfibrozil Other (See Comments)    Patient is not aware of this allergy but states that she has side effects to a cholesterol medication in the past  . Macrobid [Nitrofurantoin] Nausea And Vomiting  . Oxycodone Other (See Comments)    hallucinations    Patient Measurements: Height: 5\' 5"  (165.1 cm) Weight: 232 lb 9.4 oz (105.5 kg) IBW/kg (Calculated) : 57 Heparin Dosing Weight: 81 kg  Vital Signs: Temp: 98.7 F (37.1 C) (05/10 0424) Temp Source: Oral (05/10 0424) BP: 147/56 mmHg (05/10 0730) Pulse Rate: 63 (05/10 0730)  Labs:  Recent Labs  10/02/15 0223  10/03/15 0239 10/03/15 0622 10/03/15 1030 10/03/15 1712 10/04/15 0121 10/04/15 0601  HGB 7.5*  < >  --  7.9* 7.9*  --  9.1* 8.4*  HCT 24.5*  < >  --  24.7* 25.0*  --  28.8* 25.9*  PLT 120*  --   --  168 198  --   --  190  LABPROT  --   --   --   --  15.7*  --   --   --   INR  --   --   --   --  1.23  --   --   --   HEPARINUNFRC  --   < >  --  0.24*  --  0.39  --  0.24*  CREATININE 3.01*  --  2.72*  --  2.76*  --   --   --   TROPONINI  --   < > 0.77*  --  0.53*  0.53* 1.41* 1.19*  --   < > = values in this interval not displayed.  Estimated Creatinine Clearance: 22.9 mL/min (by C-G formula based on Cr of 2.76).   Assessment: 36 YOF continue on IV heparin for ACS.  Heparin level sub-therapeutic this AM; no bleeding reported.   Goal of Therapy:  Heparin level 0.3-0.7 units/ml Monitor platelets by anticoagulation protocol: Yes    Plan:  - Increase heparin gtt to 1800 units/hr - Check 8 hr HL - Daily HL / CBC   Filiberto Wamble D. Mina Marble, PharmD, BCPS Pager:  [phone] 10/04/2015, 8:19 AM

## 2015-10-05 ENCOUNTER — Inpatient Hospital Stay (HOSPITAL_COMMUNITY): Payer: Medicare Other

## 2015-10-05 ENCOUNTER — Encounter: Payer: Self-pay | Admitting: Vascular Surgery

## 2015-10-05 ENCOUNTER — Ambulatory Visit: Payer: Medicare Other | Admitting: "Endocrinology

## 2015-10-05 ENCOUNTER — Encounter (HOSPITAL_COMMUNITY): Payer: Self-pay | Admitting: Cardiology

## 2015-10-05 DIAGNOSIS — R5381 Other malaise: Secondary | ICD-10-CM | POA: Diagnosis present

## 2015-10-05 DIAGNOSIS — I2510 Atherosclerotic heart disease of native coronary artery without angina pectoris: Secondary | ICD-10-CM | POA: Diagnosis present

## 2015-10-05 DIAGNOSIS — IMO0002 Reserved for concepts with insufficient information to code with codable children: Secondary | ICD-10-CM | POA: Diagnosis present

## 2015-10-05 DIAGNOSIS — N179 Acute kidney failure, unspecified: Secondary | ICD-10-CM | POA: Diagnosis present

## 2015-10-05 DIAGNOSIS — E118 Type 2 diabetes mellitus with unspecified complications: Secondary | ICD-10-CM

## 2015-10-05 DIAGNOSIS — J189 Pneumonia, unspecified organism: Secondary | ICD-10-CM | POA: Diagnosis present

## 2015-10-05 DIAGNOSIS — I272 Other secondary pulmonary hypertension: Secondary | ICD-10-CM

## 2015-10-05 DIAGNOSIS — I4891 Unspecified atrial fibrillation: Secondary | ICD-10-CM

## 2015-10-05 DIAGNOSIS — E1165 Type 2 diabetes mellitus with hyperglycemia: Secondary | ICD-10-CM | POA: Diagnosis present

## 2015-10-05 DIAGNOSIS — N189 Chronic kidney disease, unspecified: Secondary | ICD-10-CM

## 2015-10-05 HISTORY — DX: Unspecified atrial fibrillation: I48.91

## 2015-10-05 LAB — CBC
HCT: 27.2 % — ABNORMAL LOW (ref 36.0–46.0)
HEMOGLOBIN: 8.6 g/dL — AB (ref 12.0–15.0)
MCH: 26.7 pg (ref 26.0–34.0)
MCHC: 31.6 g/dL (ref 30.0–36.0)
MCV: 84.5 fL (ref 78.0–100.0)
Platelets: 201 10*3/uL (ref 150–400)
RBC: 3.22 MIL/uL — AB (ref 3.87–5.11)
RDW: 15.2 % (ref 11.5–15.5)
WBC: 9.9 10*3/uL (ref 4.0–10.5)

## 2015-10-05 LAB — COMPREHENSIVE METABOLIC PANEL
ALK PHOS: 101 U/L (ref 38–126)
ALT: 23 U/L (ref 14–54)
AST: 31 U/L (ref 15–41)
Albumin: 2.4 g/dL — ABNORMAL LOW (ref 3.5–5.0)
Anion gap: 13 (ref 5–15)
BILIRUBIN TOTAL: 0.5 mg/dL (ref 0.3–1.2)
BUN: 66 mg/dL — AB (ref 6–20)
CALCIUM: 8.2 mg/dL — AB (ref 8.9–10.3)
CO2: 21 mmol/L — ABNORMAL LOW (ref 22–32)
CREATININE: 2.7 mg/dL — AB (ref 0.44–1.00)
Chloride: 105 mmol/L (ref 101–111)
GFR calc Af Amer: 19 mL/min — ABNORMAL LOW (ref >60)
GFR, EST NON AFRICAN AMERICAN: 17 mL/min — AB (ref >60)
GLUCOSE: 111 mg/dL — AB (ref 65–99)
Potassium: 4.1 mmol/L (ref 3.5–5.1)
Sodium: 139 mmol/L (ref 135–145)
TOTAL PROTEIN: 6.1 g/dL — AB (ref 6.5–8.1)

## 2015-10-05 LAB — GLUCOSE, CAPILLARY
GLUCOSE-CAPILLARY: 147 mg/dL — AB (ref 65–99)
GLUCOSE-CAPILLARY: 198 mg/dL — AB (ref 65–99)
Glucose-Capillary: 105 mg/dL — ABNORMAL HIGH (ref 65–99)
Glucose-Capillary: 141 mg/dL — ABNORMAL HIGH (ref 65–99)
Glucose-Capillary: 133 mg/dL — ABNORMAL HIGH (ref 65–99)
Glucose-Capillary: 176 mg/dL — ABNORMAL HIGH (ref 65–99)

## 2015-10-05 LAB — CULTURE, BLOOD (ROUTINE X 2)
CULTURE: NO GROWTH
Culture: NO GROWTH

## 2015-10-05 LAB — HEPARIN LEVEL (UNFRACTIONATED): Heparin Unfractionated: 0.4 IU/mL (ref 0.30–0.70)

## 2015-10-05 MED ORDER — HYDROCODONE-ACETAMINOPHEN 7.5-325 MG PO TABS
1.0000 | ORAL_TABLET | Freq: Four times a day (QID) | ORAL | Status: DC | PRN
  Administered 2015-10-11 – 2015-10-17 (×6): 1 via ORAL
  Filled 2015-10-05 (×6): qty 1

## 2015-10-05 MED ORDER — FUROSEMIDE 10 MG/ML IJ SOLN
40.0000 mg | Freq: Two times a day (BID) | INTRAMUSCULAR | Status: DC
  Administered 2015-10-05 – 2015-10-08 (×6): 40 mg via INTRAVENOUS
  Filled 2015-10-05 (×7): qty 4

## 2015-10-05 MED ORDER — METOPROLOL TARTRATE 5 MG/5ML IV SOLN
2.5000 mg | Freq: Once | INTRAVENOUS | Status: AC
  Administered 2015-10-05: 2.5 mg via INTRAVENOUS
  Filled 2015-10-05: qty 5

## 2015-10-05 MED ORDER — METOPROLOL TARTRATE 50 MG PO TABS
75.0000 mg | ORAL_TABLET | Freq: Two times a day (BID) | ORAL | Status: DC
  Administered 2015-10-05 – 2015-10-06 (×3): 75 mg via ORAL
  Filled 2015-10-05 (×3): qty 1

## 2015-10-05 MED ORDER — DILTIAZEM HCL 60 MG PO TABS
60.0000 mg | ORAL_TABLET | Freq: Four times a day (QID) | ORAL | Status: DC
  Administered 2015-10-05 – 2015-10-06 (×3): 60 mg via ORAL
  Filled 2015-10-05 (×4): qty 1

## 2015-10-05 NOTE — Progress Notes (Signed)
[facility] TEAM 1 - Stepdown/ICU TEAM Progress Note  Briana [name] [id number] DOB: September 21, 1945 DOA: 09/30/2015 PCP: Antionette Fairy, PA-C  Admit HPI / Brief Narrative: Briana [name] is an 70 y.o. 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 In ED found to have: hypoxia, a fever to 101.6, WBC of 15.5, EKG changes (lateral ST depression), mild troponin elevation (0.12). BNP was elevated to 894.0, previously was 391. She had a CXR which showed alveolar opacities, most dense in the Rt base. Cultures were obtained, she was started on CAP coverage, and due to the hypoxia was transferred to [facility] for tx of pneumonia.    HPI/Subjective: 5/11 A/O 4, negative CP, decreased SOB. Sitting at edge of bed comfortably eating lunch negative N/V. Base weight is 102 kg (225 pounds). Does not weigh herself daily. States prior to admission no previous episode of A. fib with RVR. Agrees that she is somewhat deconditioned.    Assessment/Plan: Acute hypoxic Respiratory Failure in setting of bilateral pulmonary infiltrates. PNA vs Pulmonary Edema -continue current antibiotics 5 days  R/o PE; although think we have fairly good explanation for her hypoxia  -LE dopplers neg -Obtain VQ scan once A. fib RVR controlled  Possible CAP -Complete 5 day course antibiotics  CAD native artery  Acute on chronic Diastolic CHF/Pulmonary Hypertension -Hold all anticoagulants secondary to possible GI bleed; -Change Diltiazem gtt--> Cardizem 60 mg QID -Toprol 75 mg BID  -Strict in and out since admission -417ml -Daily weight Filed Weights   10/03/15 0500 10/04/15 0500 10/05/15 0329  Weight: 106.6 kg (235 lb 0.2 oz) 105.5 kg (232 lb 9.4 oz) 105.3 kg (232 lb 2.3 oz)   -Transfuse for  hemoglobin<8 -5/9 Transfuse 1 unit PRBC  A-Fib RVR/RBBB - EKG shows A. fib RVR with RBBB -See CHF  HTN emergency w/ associated pulmonary edema  -Nitro drip off BP controlled    Acute on CKD (baseline Cr~1.6 -2 ) -Renal US; nondiagnostic for increased failure  Acute blood loss anemia? -Hemoglobin stable   Leukocytosis -Resolved   DM Type 2 controlled with complication -123456 hemoglobin A1c= 6.9 - -NovoLog 70/30  15 units BID -Moderate SSI  Depression -Zoloft 100 mg daily   Deconditioning -PT/OT, evaluate patient with new onset A. fib RVR with deconditioning for CIR vs SNF   Code Status: FULL Family Communication: Daughter present at time of exam Disposition Plan: Resolution of A. fib RVR    Consultants: Dr.Rakesh Alva PC CM    Procedure/Significant Events: CXR -- alveolar filling pattern, most dense in RLL. EKG - lateral ST depression, TWI throughout 5/7 Bilat LE Doppler; Negative DVT/SVT 5/8 echocardiogram;Left ventricle: mild focal basal hypertrophy of the septum. 55% to 60%. -(grade 3 diastolic dysfunction).-Left atrium: severely dilated. - Tricuspid valve: moderate regurgitation. - Pulmonary arteries: PA peak pressure: 69 mm Hg (S). 5/9 Transfuse 1 unit PRBC 5/11 renal ultrasound;-Low normal renal cortical thickness, a finding that may be seen with medical renal disease. .   Culture Blood 5/6 left arm/right hand NGTD Urine 5/6 negative final 5/7 MRSA by PCR negative  Antibiotics: Azithromycin 5/6 >>5/10 Ceftriaxone 5/6 >> 5/11  DVT prophylaxis: Heparin drip  Devices    LINES / TUBES:     Continuous Infusions: . sodium chloride 10 mL/hr at 10/04/15 1030  .  heparin 1,900 Units/hr (10/05/15 0737)    Objective: VITAL SIGNS: Temp: 97.6 F (36.4 C) (05/11 1623) Temp Source: Oral (05/11 1623) BP: 151/91 mmHg (05/11 1400) Pulse Rate: 42 (05/11 1400) SPO2; FIO2:   Intake/Output Summary (Last 24 hours) at 10/05/15 1844 Last data  filed at 10/05/15 1600  Gross per 24 hour  Intake 1733.43 ml  Output    850 ml  Net 883.43 ml     Exam: General: A/O 4, positive acute respiratory distress Eyes: negative scleral hemorrhage, negative icterus ENT: Negative Runny nose, negative gingival bleeding, Neck:  Negative scars, masses, torticollis, lymphadenopathy, JVD Lungs: diminished breath sounds, clear to auscultation bilateral  Cardiac; regular rhythm and rate, negative murmurs rubs or gallops, normal S1/S2 Abdomen: Morbidly Obese, negative abdominal pain, nondistended, positive soft, bowel sounds, no rebound, no ascites, no appreciable mass Extremities: No significant cyanosis, clubbing, or edema bilateral lower extremities Psychiatric:  Negative depression, negative anxiety, negative fatigue, negative mania  Neurologic:  Cranial nerves II through XII intact, tongue/uvula midline, all extremities muscle strength 5/5, sensation intact throughout, negative dysarthria, negative expressive aphasia, negative receptive aphasia.    Data Reviewed: Basic Metabolic Panel:  Recent Labs Lab 10/01/15 0413 10/02/15 0223 10/03/15 0239 10/03/15 1030 10/05/15 0217  NA 137 134* 135 137 139  K 4.7 4.5 4.3 4.1 4.1  CL 107 105 103 105 105  CO2 19* 17* 19* 18* 21*  GLUCOSE 242* 212* 190* 220* 111*  BUN 38* 49* 55* 56* 66*  CREATININE 2.18* 3.01* 2.72* 2.76* 2.70*  CALCIUM 9.0 7.9* 7.9* 8.4* 8.2*  MG 2.1  --   --  2.0  --   PHOS 4.0  --   --   --   --    Liver Function Tests:  Recent Labs Lab 09/30/15 2140 10/02/15 0223 10/03/15 1030 10/05/15 0217  AST 16 15 18 31   ALT 11* 10* 15 23  ALKPHOS 72 55 72 101  BILITOT 0.7 0.4 0.6 0.5  PROT 7.4 5.6* 6.4* 6.1*  ALBUMIN 3.6 2.5* 2.5* 2.4*   No results for input(s): LIPASE, AMYLASE in the last 168 hours. No results for input(s): AMMONIA in the last 168 hours. CBC:  Recent Labs Lab 09/30/15 2140  10/03/15 0622 10/03/15 1030 10/04/15 0121 10/04/15 0601 10/04/15 0808  10/04/15 1616 10/05/15 0217  WBC 15.5*  < > 10.6* 11.4*  --  10.4  --  10.3 9.9  NEUTROABS 11.7*  --   --  9.4*  --   --   --   --   --   HGB 10.6*  < > 7.9* 7.9* 9.1* 8.4* 8.3* 9.2* 8.6*  HCT 33.5*  < > 24.7* 25.0* 28.8* 25.9* 26.0* 29.0* 27.2*  MCV 87.5  < > 87.6 87.7  --  85.8  --  84.3 84.5  PLT 215  < > 168 198  --  190  --  223 201  < > = values in this interval not displayed. Cardiac Enzymes:  Recent Labs Lab 10/03/15 1712 10/04/15 0121 10/04/15 1059 10/04/15 1616 10/04/15 2158  TROPONINI 1.41* 1.19* 0.86* 0.73* 0.60*   BNP (last 3 results)  Recent Labs  05/31/15 1522 09/30/15 2140 10/02/15 2204  BNP 391.0* 894.0* 1262.5*    ProBNP (last 3 results) No results for input(s): PROBNP in the last 8760 hours.  CBG:  Recent Labs Lab 10/05/15 0011 10/05/15 0323 10/05/15 0859 10/05/15 1138 10/05/15 1619  GLUCAP 147* 105* 141* 133* 176*    Recent Results (from the past  240 hour(s))  Blood Culture (routine x 2)     Status: None   Collection Time: 09/30/15 10:14 PM  Result Value Ref Range Status   Specimen Description BLOOD LEFT ARM DRAWN BY RN  Final   Special Requests BOTTLES DRAWN AEROBIC AND ANAEROBIC 6CC  Final   Culture NO GROWTH 5 DAYS  Final   Report Status 10/05/2015 FINAL  Final  Blood Culture (routine x 2)     Status: None   Collection Time: 09/30/15 10:19 PM  Result Value Ref Range Status   Specimen Description BLOOD RIGHT HAND  Final   Special Requests BOTTLES DRAWN AEROBIC ONLY [place]  Final   Culture NO GROWTH 5 DAYS  Final   Report Status 10/05/2015 FINAL  Final  Urine culture     Status: None   Collection Time: 09/30/15 10:35 PM  Result Value Ref Range Status   Specimen Description URINE, CATHETERIZED  Final   Special Requests NONE  Final   Culture   Final    NO GROWTH 2 DAYS Performed at [facility]    Report Status 10/03/2015 FINAL  Final  MRSA PCR Screening     Status: None   Collection Time: 10/01/15  7:48 AM  Result Value  Ref Range Status   MRSA by PCR NEGATIVE NEGATIVE Final    Comment:        The GeneXpert MRSA Assay (FDA approved for NASAL specimens only), is one component of a comprehensive MRSA colonization surveillance program. It is not intended to diagnose MRSA infection nor to guide or monitor treatment for MRSA infections.      Studies:  Recent x-ray studies have been reviewed in detail by the Attending Physician  Scheduled Meds:  Scheduled Meds: . aspirin EC  81 mg Oral Daily  . atorvastatin  80 mg Oral QPM  . diltiazem  60 mg Oral Q6H  . furosemide  40 mg Intravenous BID  . hydrALAZINE  100 mg Oral Q8H  . insulin aspart  0-15 Units Subcutaneous Q4H  . insulin aspart protamine- aspart  15 Units Subcutaneous BID WC  . loratadine  10 mg Oral Daily  . metoprolol tartrate  75 mg Oral BID  . sertraline  100 mg Oral Daily    Time spent on care of this patient: 40 mins   Damain Broadus, Geraldo Docker , MD  [facility] Office  [phone] Pager [phone]  On-Call/Text Page:      [email or web address]      password [email or web address]  If 7PM-7AM, please contact night-coverage [email or web address] Password [facility] 10/05/2015, 6:44 PM   LOS: 4 days   Care during the described time interval was provided by me .  I have reviewed this patient's available data, including medical history, events of note, physical examination, and all test results as part of my evaluation. I have personally reviewed and interpreted all radiology studies.   Dia Crawford, MD [phone] Pager

## 2015-10-05 NOTE — Progress Notes (Signed)
SUBJECTIVE:  Complains of SOB and cough  OBJECTIVE:   Vitals:   Filed Vitals:   10/05/15 0500 10/05/15 0600 10/05/15 0700 10/05/15 0800  BP: 163/80 143/70 129/110 147/73  Pulse: 121 123 123 124  Temp:      TempSrc:      Resp: 28 17 24 24   Height:      Weight:      SpO2: 92% 90% 95% 94%   I&O's:   Intake/Output Summary (Last 24 hours) at 10/05/15 0836 Last data filed at 10/05/15 0800  Gross per 24 hour  Intake 901.68 ml  Output   1520 ml  Net -618.32 ml   TELEMETRY: Reviewed telemetry pt in atrial fibrillation with RVR:     PHYSICAL EXAM General: Well developed, well nourished, in no acute distress Head: Eyes PERRLA, No xanthomas.   Normal cephalic and atramatic  Lungs:   Crackles at bases Heart:   Irregularly irregular and tachy S1 S2 Pulses are 2+ & equal. Abdomen: Bowel sounds are positive, abdomen soft and non-tender without masses  Extremities:   No clubbing, cyanosis or edema.  DP +1 Neuro: Alert and oriented X 3. Psych:  Good affect, responds appropriately   LABS: Basic Metabolic Panel:  Recent Labs  10/03/15 1030 10/05/15 0217  NA 137 139  K 4.1 4.1  CL 105 105  CO2 18* 21*  GLUCOSE 220* 111*  BUN 56* 66*  CREATININE 2.76* 2.70*  CALCIUM 8.4* 8.2*  MG 2.0  --    Liver Function Tests:  Recent Labs  10/03/15 1030 10/05/15 0217  AST 18 31  ALT 15 23  ALKPHOS 72 101  BILITOT 0.6 0.5  PROT 6.4* 6.1*  ALBUMIN 2.5* 2.4*   No results for input(s): LIPASE, AMYLASE in the last 72 hours. CBC:  Recent Labs  10/03/15 1030  10/04/15 1616 10/05/15 0217  WBC 11.4*  < > 10.3 9.9  NEUTROABS 9.4*  --   --   --   HGB 7.9*  < > 9.2* 8.6*  HCT 25.0*  < > 29.0* 27.2*  MCV 87.7  < > 84.3 84.5  PLT 198  < > 223 201  < > = values in this interval not displayed. Cardiac Enzymes:  Recent Labs  10/04/15 1059 10/04/15 1616 10/04/15 2158  TROPONINI 0.86* 0.73* 0.60*   BNP: Invalid input(s): POCBNP D-Dimer: No results for input(s): DDIMER in  the last 72 hours. Hemoglobin A1C: No results for input(s): HGBA1C in the last 72 hours. Fasting Lipid Panel: No results for input(s): CHOL, HDL, LDLCALC, TRIG, CHOLHDL, LDLDIRECT in the last 72 hours. Thyroid Function Tests:  Recent Labs  10/04/15 1616  TSH 1.354   Anemia Panel: No results for input(s): VITAMINB12, FOLATE, FERRITIN, TIBC, IRON, RETICCTPCT in the last 72 hours. Coag Panel:   Lab Results  Component Value Date   INR 1.23 10/03/2015   INR 0.97 10/02/2009    RADIOLOGY: US Carotid Duplex Bilateral  09/29/2015  CLINICAL DATA:  Bilateral carotid artery stenosis. EXAM: BILATERAL CAROTID DUPLEX ULTRASOUND TECHNIQUE: Pearline Cables scale imaging, color Doppler and duplex ultrasound were performed of bilateral carotid and vertebral arteries in the neck. COMPARISON:  11/24/2014. FINDINGS: Criteria: Quantification of carotid stenosis is based on velocity parameters that correlate the residual internal carotid diameter with NASCET-based stenosis levels, using the diameter of the distal internal carotid lumen as the denominator for stenosis measurement. The following velocity measurements were obtained: RIGHT ICA:  113/16 cm/sec CCA:  123456 cm/sec SYSTOLIC ICA/CCA RATIO:  1.0 DIASTOLIC ICA/CCA RATIO:  1.5 ECA:  128 cm/sec LEFT ICA:  393/82 cm/sec CCA:  [id number] cm/sec SYSTOLIC ICA/CCA RATIO:  4.6 DIASTOLIC ICA/CCA RATIO:  5.8 ECA:  160 cm/sec RIGHT CAROTID ARTERY: Mild to moderate right carotid bifurcation atherosclerotic vascular disease. No flow limiting stenosis. Degree of stenosis less than 50%. RIGHT VERTEBRAL ARTERY:  Patent with antegrade flow. LEFT CAROTID ARTERY: Prominent left carotid bifurcation and proximal ICA atherosclerotic vascular plaque. Degree of stenosis greater than 70%. LEFT VERTEBRAL ARTERY:  Patent antegrade flow. IMPRESSION: 1. Greater than 70 degrees stenosis left carotid bifurcation and proximal ICA. Vascular surgery consultation suggested. 2. Atherosclerotic vascular plaque  right carotid bifurcation. Degree of stenosis less than 50%. Vertebral arteries are patent antegrade flow. Electronically Signed   By: Marcello Moores  Register   On: 09/29/2015 13:15   Dg Chest Port 1 View  10/05/2015  CLINICAL DATA:  Pulmonary edema, history of coronary artery disease, CHF, CABG. Acute respiratory failure with hypoxia. EXAM: PORTABLE CHEST 1 VIEW COMPARISON:  Portable chest x-ray of Oct 04, 2015 FINDINGS: The lungs are well-expanded. The interstitial markings remain increased. The left hemidiaphragm is slightly better demonstrated today but increased retrocardiac density persists. The cardiac silhouette remains enlarged. The pulmonary vascularity is slightly less engorged. There are post CABG changes. IMPRESSION: CHF superimposed upon COPD. Pulmonary interstitial and mild alveolar edema, slightly improved. Left basilar atelectasis or pneumonia and small left pleural effusion. Electronically Signed   By: David  Martinique M.D.   On: 10/05/2015 07:25   Dg Chest Port 1 View  10/04/2015  CLINICAL DATA:  Hypoxia.  Dyspnea EXAM: PORTABLE CHEST 1 VIEW COMPARISON:  10/03/2015 FINDINGS: Airspace opacities in the central and basilar regions have worsened from the earlier study. Probable left pleural effusion. Moderate vascular and interstitial congestion. Moderate cardiomegaly. IMPRESSION: The findings likely represent congestive heart failure. Infectious infiltrates cannot be excluded. Electronically Signed   By: Andreas Newport M.D.   On: 10/04/2015 01:39   Dg Chest Port 1 View  10/03/2015  CLINICAL DATA:  Shortness of breath EXAM: PORTABLE CHEST 1 VIEW COMPARISON:  10/02/2015 FINDINGS: Cardiomegaly again noted. Status post CABG. Patchy airspace disease bilaterally especially lower lobes again noted. Probable small left pleural effusion with left basilar atelectasis or infiltrate. Central mild vascular congestion without convincing pulmonary edema. IMPRESSION: Patchy airspace disease bilaterally especially  lower lobes again noted. Probable small left pleural effusion and left basilar atelectasis or infiltrate. Central mild vascular congestion without convincing pulmonary edema. Electronically Signed   By: Lahoma Crocker M.D.   On: 10/03/2015 08:37   Dg Chest Port 1 View  10/02/2015  CLINICAL DATA:  Acute respiratory failure. EXAM: PORTABLE CHEST 1 VIEW COMPARISON:  10/01/2015 FINDINGS: Airspace opacities persist throughout the central and basilar regions of both lungs, without significant interval change. No pneumothorax. Unchanged cardiomegaly. IMPRESSION: No significant interval change in the bilateral airspace opacities. Electronically Signed   By: Andreas Newport M.D.   On: 10/02/2015 23:10   Dg Chest Port 1 View  10/01/2015  CLINICAL DATA:  Initial evaluation for acute chest pain. EXAM: PORTABLE CHEST 1 VIEW COMPARISON:  Prior radiograph from 09/30/2015. FINDINGS: Median sternotomy wires with underlying CABG markers again noted. Cardiomegaly stable. Atheromatous plaque within the aortic arch. Lungs mildly hypoinflated with elevation left hemidiaphragm. Interval worsening and patchy right perihilar and basilar opacity, worrisome for progressive pneumonia. Additional patchy opacity at the left lung base may reflect atelectasis or infiltrate. Mild vascular congestion without overt pulmonary edema. No definite pleural effusion, although the  right costophrenic angle is incompletely visualized. No pneumothorax. No acute osseus abnormality. IMPRESSION: 1. Interval worsening in patchy right perihilar and basilar opacity, worrisome for progressive pneumonia. 2. Elevation of the left hemidiaphragm with additional patchy left basilar patchy opacity, which may reflect atelectasis or additional infiltrate. 3. Stable cardiomegaly with mild pulmonary vascular congestion without overt pulmonary edema. Electronically Signed   By: Jeannine Boga M.D.   On: 10/01/2015 05:15   Dg Chest Port 1 View  09/30/2015  CLINICAL  DATA:  Dyspnea, cough, fever. EXAM: PORTABLE CHEST 1 VIEW COMPARISON:  05/31/2015 FINDINGS: There is unchanged moderate cardiomegaly. There is patchy right base opacity which could represent pneumonia. No large effusion. IMPRESSION: Patchy right lung base opacity, potentially pneumonia although hemorrhage or asymmetric edema could also produce this appearance. Electronically Signed   By: Andreas Newport M.D.   On: 09/30/2015 22:33    ASSESSMENT AND PLAN: Active Problems:  Acute respiratory failure (HCC)  Hypoxia  Elevated troponin  Chest pain  DVT (deep venous thrombosis) (HCC)  Hypoxemia   Atrial Fibrillation with RVR  1. Elevated troponin: patient presented with SOB and chest pain x 2 days. She states she was having intense sharp pain in the center of her chest with radiation to her neck and jaw. Pain occurred at rest and was not reproducible with palpation. She was found to have PNA at [facility] and required Bipap for hypoxia. BNP was elevated at 849 c/w CHF, troponin has been 1.15>>2.13>>1.55>>0.66>>0.77>>1.19>>0.86. She does have a history of CAD with CABG in 2005. Echo showee normoal LVF with EF 55-60% with grade 3 diastolic dysfunction, mild AR, mild MR and severe pulmonary HTN.  She would benefit from ischemic evaluation at some point once she has recovered from respiratory issues. She is on heparin gtt/ASA/statin.   2. Acute on chronic diastolic CHF with elevated BNP: patient presented with hypoxia and found to have PNA. Echo shows normal LVF with no RWMAs and grade 3 diastolic dysfunction. On IV furosemide creatinine went up to 3.01 and Lasix was stopped. Now with increased crackles at bases and back on lasix. She is net neg -811cc. Creatinine improved from 3 and trending down (3.01>>2.72>>2.76>>2.7).  She is still volume overloaded by chest xray.  Weight is up 2 lbs from admit.  Difficult situation in that I suspect she has some underlying medical renal disease from her HTN  and increased diuresis a few days ago resulted in worsening renal function.  Will increase lasix to 40mg  IV BID and recommend renal consult.  She needs aggressive treatment of her HTN.  Will increase metoprolol to 75mg  BID and continue Cardizem gtt.  No aldactone or ACE I due to acute on CKD. Follow renal function closely.    3. HTN: BP improved but still elevated. Off Labetalol gtt. Hydralazine and metoprolol added and now back on IV Cardizem gtt for afib. Increase metoprolol to 75mg  BID.    4. New onset atrial fibrillation with RVR: This is likely being driven by hypoxia from her PNA and CHF. TSH is normal.   HR persists in the 120-140's despite Cardizem gtt and now at 15mg /hr. She appears to be in atrial flutter with 2:1 block.  Will give Lopressor 2.5mg  IV now and increase metoprolol to 75mg  BID.  If HR does not improve will start IV Amio gtt for rate control.  Continue IV Heparin gtt.  Her CHADS2VASC score is 5 so will need long term anticoagulation with NOAC  I have spent a total of  40 minutes with patient reviewing notes , telemetry, EKGs, labs and examining patient as well as establishing an assessment and plan that was discussed with the patient.  > 50% of time was spent in direct patient care.    Briana Margarita, MD  10/05/2015  8:36 AM

## 2015-10-05 NOTE — Progress Notes (Signed)
PULMONARY / CRITICAL CARE MEDICINE   Name: Briana [name] MRN: [id number] DOB: [birth year]    ADMISSION DATE:  09/30/2015  CHIEF COMPLAINT:  Chest pain  HISTORY OF PRESENT ILLNESS:   Briana [name] is an 70 y.o. woman with a PMHx most notable for DM2 and cardiovascular disease (CAD, PVD, as well as diastolic HF) who presented to the ED at [facility] on the evening of 5/6 with 24 hours of chest pain. In ED found to have: hypoxia, a fever to 101.6, WBC of 15.5, EKG changes (lateral ST depression), mild troponin elevation (0.12). BNP was elevated to 894.0, previously was 391. She had a CXR which showed alveolar opacities, most dense in the R base. Cultures were obtained, she was started on CAP coverage, and due to the hypoxia was transferred to [facility] for tx of pneumonia.   SUBJECTIVE:  Afebrile On 6L [place], oob to chair  Good UO with lasix    VITAL SIGNS: BP 146/82 mmHg  Pulse 124  Temp(Src) 98.9 F (37.2 C) (Oral)  Resp 28  Ht 5\' 5"  (1.651 m)  Wt 232 lb 2.3 oz (105.3 kg)  BMI 38.63 kg/m2  SpO2 97% 100% NRB HEMODYNAMICS:    VENTILATOR SETTINGS:    INTAKE / OUTPUT: I/O last 3 completed shifts: In: 1862.2 [I.V.:1177.2; Blood:335; IV Piggyback:350] Out: [id number]  PHYSICAL EXAMINATION: General:  Elderly woman on bipap, anxious Neuro:  Grossly normal motor , alert, interactive HEENT:  MMM Cardiovascular:  Heart sounds dual and normal. Lungs: Crackles noted per bases; accessory muscle use noted Abdomen:  Obese, soft Musculoskeletal:  No deformities Skin:  No rashes.  LABS:  BMET  Recent Labs Lab 10/03/15 0239 10/03/15 1030 10/05/15 0217  NA 135 137 139  K 4.3 4.1 4.1  CL 103 105 105  CO2 19* 18* 21*  BUN 55* 56* 66*  CREATININE 2.72* 2.76* 2.70*  GLUCOSE 190* 220* 111*    Electrolytes  Recent Labs Lab 10/01/15 0413  10/03/15 0239 10/03/15 1030 10/05/15 0217  CALCIUM 9.0  < > 7.9* 8.4* 8.2*  MG 2.1  --   --  2.0  --   PHOS 4.0  --   --   --    --   < > = values in this interval not displayed.  CBC  Recent Labs Lab 10/04/15 0601 10/04/15 0808 10/04/15 1616 10/05/15 0217  WBC 10.4  --  10.3 9.9  HGB 8.4* 8.3* 9.2* 8.6*  HCT 25.9* 26.0* 29.0* 27.2*  PLT 190  --  223 201    Coag's  Recent Labs Lab 10/03/15 1030  INR 1.23    Sepsis Markers  Recent Labs Lab 09/30/15 2210 10/01/15 0413 10/01/15 0815 10/02/15 0223 10/03/15 0239  LATICACIDVEN 1.68  --  0.8  --   --   PROCALCITON  --  0.18  --  0.69 0.62    ABG  Recent Labs Lab 10/03/15 0852 10/04/15 0113 10/04/15 0554  PHART 7.302* 7.281* 7.368  PCO2ART 35.8 37.2 33.4*  PO2ART 95.0 62.0* 84.5    Liver Enzymes  Recent Labs Lab 10/02/15 0223 10/03/15 1030 10/05/15 0217  AST 15 18 31   ALT 10* 15 23  ALKPHOS 55 72 101  BILITOT 0.4 0.6 0.5  ALBUMIN 2.5* 2.5* 2.4*    Cardiac Enzymes  Recent Labs Lab 10/04/15 1059 10/04/15 1616 10/04/15 2158  TROPONINI 0.86* 0.73* 0.60*    Glucose  Recent Labs Lab 10/04/15 1108 10/04/15 1558 10/04/15 1914 10/05/15 0011 10/05/15 0323 10/05/15 [id number]  GLUCAP 182* 212* 190* 147* 105* 141*    Imaging Dg Chest Port 1 View  10/05/2015  CLINICAL DATA:  Pulmonary edema, history of coronary artery disease, CHF, CABG. Acute respiratory failure with hypoxia. EXAM: PORTABLE CHEST 1 VIEW COMPARISON:  Portable chest x-ray of Oct 04, 2015 FINDINGS: The lungs are well-expanded. The interstitial markings remain increased. The left hemidiaphragm is slightly better demonstrated today but increased retrocardiac density persists. The cardiac silhouette remains enlarged. The pulmonary vascularity is slightly less engorged. There are post CABG changes. IMPRESSION: CHF superimposed upon COPD. Pulmonary interstitial and mild alveolar edema, slightly improved. Left basilar atelectasis or pneumonia and small left pleural effusion. Electronically Signed   By: David  Martinique M.D.   On: 10/05/2015 07:25      STUDIES:  Echo  5/8 nml LVEF, gr 3 DD, RVSP 69 EKG -5/9 a-fib with RVR, RBBB,T-wave abnormality  CULTURES: Blood 5/6 x2 >>ng Urine 5/6 >>ng  ANTIBIOTICS: Azithromycin 5/6 >>5/10 Ceftriaxone 5/6 >>  SIGNIFICANT EVENTS: Transferred to [facility] MICU for hyxpoxia 5/9/>>A-Fib w/ RVR  5/9 Chest pain ,Hb drop again this am to 7.9 5/10 bipap  5/10 Dyspneic after pRBC requiring bipap  LINES/TUBES: None  DISCUSSION: 70 year old female w/ acute hypoxic resp failure in setting of acute onset of diffuse pulmonary infiltrates. Her story is more c/w cardiac than pneumonia. She had been w/out her lasix for at least 3d prior. Doubt PE; we have a good explanation for her hypoxia. Episode of A-fib with RVR  5/9, managed by [facility] and Cards. Worsening edema after pRBC transfusion 5/9  ASSESSMENT / PLAN:  PULMONARY A: Acute hypoxic Respiratory Failure in setting of bilateral pulmonary infiltrates. Favor acute Pulmonary Edema over PNA LE dopplers neg P:   Bipap prn VQ scan will not be helpful here    CARDIOVASCULAR A:  Hx of CAD Acute on chronic diastolic HF HTN emergency w/ associated pulmonary edema -Off labetolol gtt  EKG changes  afib w/ RVR Troponin peak 1.4  P:  Appreciate cards assist Keep statin Heparin gtt per cards Ct  home lopressor and hydralazine  Prn Lopressor for rate control Ischemia eval in future   RENAL A:   Acute on Chronic renal insuffiencey: Scr rising Metabolic acidosis - resolved P:   Renal US for RAS>> pending Tend I&O Trend BUN/Creat    INFECTIOUS A:   Possible CAP Worsening CXR , likely due to fluid -now improved P:    ct Rocephin x 7ds    ENDOCRINE A:   DM2 -   With hyperglycemia  P:   Cut home insulin in 1/2.  CBG's Q4    Summary -  Probability for PE appears to be very low given bilateral infiltrates, alternative causes for hypoxia and negative Doppler Treating for both CHF & CAP  PCCM to stay as consult Can transfer to SDU  Briana Mead MD.  FCCP. [place] [facility] Pager [phone] If no response call [phone]   10/05/2015    10/05/2015

## 2015-10-05 NOTE — Progress Notes (Signed)
SUBJECTIVE:  Complains of SOB  OBJECTIVE:   Vitals:   Filed Vitals:   10/05/15 0500 10/05/15 0600 10/05/15 0700 10/05/15 0800  BP: 163/80 143/70 129/110 147/73  Pulse: 121 123 123 124  Temp:      TempSrc:      Resp: 28 17 24 24   Height:      Weight:      SpO2: 92% 90% 95% 94%   I&O's:   Intake/Output Summary (Last 24 hours) at 10/05/15 [id number] Last data filed at 10/05/15 0600  Gross per 24 hour  Intake 833.68 ml  Output   1520 ml  Net -686.32 ml   TELEMETRY: Reviewed telemetry pt in atrial fibrillation with RVR     PHYSICAL EXAM General: Well developed, well nourished, in no acute distress Head: Eyes PERRLA, No xanthomas.   Normal cephalic and atramatic  Lungs:   Crackles at bases and some wheezing Heart:   Irregularly irregular S1 S2 Pulses are 2+ & equal. Abdomen: Bowel sounds are positive, abdomen soft and non-tender without masses  Extremities:   No clubbing, cyanosis or edema.  DP +1 Neuro: Alert and oriented X 3. Psych:  Good affect, responds appropriately   LABS: Basic Metabolic Panel:  Recent Labs  10/03/15 1030  NA 137  K 4.1  CL 105  CO2 18*  GLUCOSE 220*  BUN 56*  CREATININE 2.76*  CALCIUM 8.4*  MG 2.0   Liver Function Tests:  Recent Labs  10/03/15 1030  AST 18  ALT 15  ALKPHOS 72  BILITOT 0.6  PROT 6.4*  ALBUMIN 2.5*   No results for input(s): LIPASE, AMYLASE in the last 72 hours. CBC:  Recent Labs  10/03/15 1030  WBC 11.4*  NEUTROABS 9.4*  HGB 7.9*  HCT 25.0*  MCV 87.7  PLT 198  < > = values in this interval not displayed. Cardiac Enzymes:  Recent Labs  10/04/15 1059  TROPONINI 0.86*   BNP: Invalid input(s): POCBNP D-Dimer: No results for input(s): DDIMER in the last 72 hours. Hemoglobin A1C: No results for input(s): HGBA1C in the last 72 hours. Fasting Lipid Panel: No results for input(s): CHOL, HDL, LDLCALC, TRIG, CHOLHDL, LDLDIRECT in the last 72 hours. Thyroid Function Tests:  Recent Labs   TSH    Anemia Panel: No results for input(s): VITAMINB12, FOLATE, FERRITIN, TIBC, IRON, RETICCTPCT in the last 72 hours. Coag Panel:   Lab Results  Component Value Date   INR 1.23 10/03/2015   INR 0.97 10/02/2009    RADIOLOGY: US Carotid Duplex Bilateral  09/29/2015  CLINICAL DATA:  Bilateral carotid artery stenosis. EXAM: BILATERAL CAROTID DUPLEX ULTRASOUND TECHNIQUE: Pearline Cables scale imaging, color Doppler and duplex ultrasound were performed of bilateral carotid and vertebral arteries in the neck. COMPARISON:  11/24/2014. FINDINGS: Criteria: Quantification of carotid stenosis is based on velocity parameters that correlate the residual internal carotid diameter with NASCET-based stenosis levels, using the diameter of the distal internal carotid lumen as the denominator for stenosis measurement. The following velocity measurements were obtained: RIGHT ICA:  113/16 cm/sec CCA:  123456 cm/sec SYSTOLIC ICA/CCA RATIO:  1.0 DIASTOLIC ICA/CCA RATIO:  1.5 ECA:  128 cm/sec LEFT ICA:  393/82 cm/sec CCA:  [id number] cm/sec SYSTOLIC ICA/CCA RATIO:  4.6 DIASTOLIC ICA/CCA RATIO:  5.8 ECA:  160 cm/sec RIGHT CAROTID ARTERY: Mild to moderate right carotid bifurcation atherosclerotic vascular disease. No flow limiting stenosis. Degree of stenosis less than 50%. RIGHT VERTEBRAL ARTERY:  Patent with antegrade flow. LEFT CAROTID ARTERY: Prominent left carotid bifurcation  and proximal ICA atherosclerotic vascular plaque. Degree of stenosis greater than 70%. LEFT VERTEBRAL ARTERY:  Patent antegrade flow. IMPRESSION: 1. Greater than 70 degrees stenosis left carotid bifurcation and proximal ICA. Vascular surgery consultation suggested. 2. Atherosclerotic vascular plaque right carotid bifurcation. Degree of stenosis less than 50%. Vertebral arteries are patent antegrade flow. Electronically Signed   By: [place]ott   On: 09/29/2015 13:15    Dg Chest Port 1 View  10/04/2015  CLINICAL DATA:  Hypoxia.  Dyspnea EXAM: PORTABLE CHEST 1 VIEW  COMPARISON:  10/03/2015 FINDINGS: Airspace opacities in the central and basilar regions have worsened from the earlier study. Probable left pleural effusion. Moderate vascular and interstitial congestion. Moderate cardiomegaly. IMPRESSION: The findings likely represent congestive heart failure. Infectious infiltrates cannot be excluded. Electronically Signed   By: Andreas Newport M.D.   On: 10/04/2015 01:39   Dg Chest Port 1 View  10/03/2015  CLINICAL DATA:  Shortness of breath EXAM: PORTABLE CHEST 1 VIEW COMPARISON:  10/02/2015 FINDINGS: Cardiomegaly again noted. Status post CABG. Patchy airspace disease bilaterally especially lower lobes again noted. Probable small left pleural effusion with left basilar atelectasis or infiltrate. Central mild vascular congestion without convincing pulmonary edema. IMPRESSION: Patchy airspace disease bilaterally especially lower lobes again noted. Probable small left pleural effusion and left basilar atelectasis or infiltrate. Central mild vascular congestion without convincing pulmonary edema. Electronically Signed   By: Lahoma Crocker M.D.   On: 10/03/2015 08:37   Dg Chest Port 1 View  10/02/2015  CLINICAL DATA:  Acute respiratory failure. EXAM: PORTABLE CHEST 1 VIEW COMPARISON:  10/01/2015 FINDINGS: Airspace opacities persist throughout the central and basilar regions of both lungs, without significant interval change. No pneumothorax. Unchanged cardiomegaly. IMPRESSION: No significant interval change in the bilateral airspace opacities. Electronically Signed   By: Andreas Newport M.D.   On: 10/02/2015 23:10   Dg Chest Port 1 View  10/01/2015  CLINICAL DATA:  Initial evaluation for acute chest pain. EXAM: PORTABLE CHEST 1 VIEW COMPARISON:  Prior radiograph from 09/30/2015. FINDINGS: Median sternotomy wires with underlying CABG markers again noted. Cardiomegaly stable. Atheromatous plaque within the aortic arch. Lungs mildly hypoinflated with elevation left hemidiaphragm.  Interval worsening and patchy right perihilar and basilar opacity, worrisome for progressive pneumonia. Additional patchy opacity at the left lung base may reflect atelectasis or infiltrate. Mild vascular congestion without overt pulmonary edema. No definite pleural effusion, although the right costophrenic angle is incompletely visualized. No pneumothorax. No acute osseus abnormality. IMPRESSION: 1. Interval worsening in patchy right perihilar and basilar opacity, worrisome for progressive pneumonia. 2. Elevation of the left hemidiaphragm with additional patchy left basilar patchy opacity, which may reflect atelectasis or additional infiltrate. 3. Stable cardiomegaly with mild pulmonary vascular congestion without overt pulmonary edema. Electronically Signed   By: Jeannine Boga M.D.   On: 10/01/2015 05:15   Dg Chest Port 1 View  09/30/2015  CLINICAL DATA:  Dyspnea, cough, fever. EXAM: PORTABLE CHEST 1 VIEW COMPARISON:  05/31/2015 FINDINGS: There is unchanged moderate cardiomegaly. There is patchy right base opacity which could represent pneumonia. No large effusion. IMPRESSION: Patchy right lung base opacity, potentially pneumonia although hemorrhage or asymmetric edema could also produce this appearance. Electronically Signed   By: Andreas Newport M.D.   On: 09/30/2015 22:33   ASSESSMENT AND PLAN: Active Problems:  Acute respiratory failure (HCC)  Hypoxia  Elevated troponin  Chest pain  DVT (deep venous thrombosis) (HCC)  Hypoxemia  1. Elevated troponin: patient presented with SOB and chest pain  x 2 days. She states she was having intense sharp pain in the center of her chest with radiation to her neck and jaw. Pain occurred at rest and was not reproducible with palpation. She was found to have PNA at [facility] and required Bipap for hypoxia. BNP was elevated at 849, troponin has been 1.15>>2.13>>1.55. She does have a history of CAD with CABG in 2005. Echo is pending. She would benefit  from ischemic evaluation at some point once she has recovered from respiratory issues. She is on heparin gtt.   2. Acute on chronic diastolic CHF with elevated BNP: patient presented with hypoxia and found to have PNA. Echo results pending. On IV furosemide creatinine went up to 3.01 and Lasix was stopped.  Now with increased crackles at bases.  She is net neg -600cc.    3. HTN: BP improved but still elevated.  Off Labetalol gtt. Hydralazine and metoprolol added and now back on IV Cardizem gtt for afib.   4. New onset atrial fibrillation with RVR: Now with HR up in the 120-140's.  Started on Cardizem gtt and now at 15mg /hr.  Will see how heart rate responds.  May need to increase BB or add IV Amio.   Sueanne Margarita, MD  10/05/2015  8:28 AM

## 2015-10-05 NOTE — Progress Notes (Addendum)
ANTICOAGULATION CONSULT NOTE - Follow Up Consult  Pharmacy Consult:  Heparin Indication: chest pain/ACS  Allergies  Allergen Reactions  . Carbamazepine Other (See Comments)    Reaction to tegretol - loopy  . Clonazepam Other (See Comments)    Unknown reaction  . Gemfibrozil Other (See Comments)    Patient is not aware of this allergy but states that she has side effects to a cholesterol medication in the past  . Macrobid [Nitrofurantoin] Nausea And Vomiting  . Oxycodone Other (See Comments)    hallucinations    Patient Measurements: Height: 5\' 5"  (165.1 cm) Weight: 232 lb 2.3 oz (105.3 kg) IBW/kg (Calculated) : 57 Heparin Dosing Weight: 81 kg  Vital Signs: Temp: 98.9 F (37.2 C) (05/11 0900) Temp Source: Oral (05/11 0900) BP: 146/82 mmHg (05/11 1000) Pulse Rate: 124 (05/11 1000)  Labs:  Recent Labs  10/03/15 0239  10/03/15 1030  10/04/15 0601 10/04/15 0808 10/04/15 1059 10/04/15 1616 10/04/15 2158 10/05/15 0217  HGB  --   < > 7.9*  < > 8.4* 8.3*  --  9.2*  --  8.6*  HCT  --   < > 25.0*  < > 25.9* 26.0*  --  29.0*  --  27.2*  PLT  --   < > 198  --  190  --   --  223  --  201  LABPROT  --   --  15.7*  --   --   --   --   --   --   --   INR  --   --  1.23  --   --   --   --   --   --   --   HEPARINUNFRC  --   < >  --   < > 0.24*  --   --  0.31  --  0.40  CREATININE 2.72*  --  2.76*  --   --   --   --   --   --  2.70*  TROPONINI 0.77*  --  0.53*  0.53*  < >  --   --  0.86* 0.73* 0.60*  --   < > = values in this interval not displayed.  Estimated Creatinine Clearance: 23.4 mL/min (by C-G formula based on Cr of 2.7).   Assessment: 39 YOF continue on IV heparin for ACS. No AC pta.  HL now therapeutic (0.4) on 1900 units/h. Hg low 8.6, plt wnl, no bleed reported.  Goal of Therapy:  Heparin level 0.3-0.7 units/ml Monitor platelets by anticoagulation protocol: Yes  Plan:  Continue heparin gtt at 1900 units/hr Daily HL / CBC Monitor s/sx bleeding  Elicia Lamp, PharmD, [facility] Clinical Pharmacist Pager [phone] 10/05/2015 11:33 AM

## 2015-10-06 DIAGNOSIS — I4891 Unspecified atrial fibrillation: Secondary | ICD-10-CM

## 2015-10-06 DIAGNOSIS — I2510 Atherosclerotic heart disease of native coronary artery without angina pectoris: Secondary | ICD-10-CM

## 2015-10-06 LAB — CBC
HCT: 27.8 % — ABNORMAL LOW (ref 36.0–46.0)
HEMOGLOBIN: 8.6 g/dL — AB (ref 12.0–15.0)
MCH: 26.2 pg (ref 26.0–34.0)
MCHC: 30.9 g/dL (ref 30.0–36.0)
MCV: 84.8 fL (ref 78.0–100.0)
PLATELETS: 246 10*3/uL (ref 150–400)
RBC: 3.28 MIL/uL — AB (ref 3.87–5.11)
RDW: 15.1 % (ref 11.5–15.5)
WBC: 10.2 10*3/uL (ref 4.0–10.5)

## 2015-10-06 LAB — PHOSPHORUS: PHOSPHORUS: 5.1 mg/dL — AB (ref 2.5–4.6)

## 2015-10-06 LAB — GLUCOSE, CAPILLARY
GLUCOSE-CAPILLARY: 114 mg/dL — AB (ref 65–99)
GLUCOSE-CAPILLARY: 144 mg/dL — AB (ref 65–99)
GLUCOSE-CAPILLARY: 142 mg/dL — AB (ref 65–99)
GLUCOSE-CAPILLARY: 185 mg/dL — AB (ref 65–99)
GLUCOSE-CAPILLARY: 201 mg/dL — AB (ref 65–99)
Glucose-Capillary: 125 mg/dL — ABNORMAL HIGH (ref 65–99)

## 2015-10-06 LAB — BASIC METABOLIC PANEL
ANION GAP: 13 (ref 5–15)
BUN: 77 mg/dL — ABNORMAL HIGH (ref 6–20)
CHLORIDE: 105 mmol/L (ref 101–111)
CO2: 19 mmol/L — AB (ref 22–32)
Calcium: 8.2 mg/dL — ABNORMAL LOW (ref 8.9–10.3)
Creatinine, Ser: 2.59 mg/dL — ABNORMAL HIGH (ref 0.44–1.00)
GFR calc non Af Amer: 18 mL/min — ABNORMAL LOW (ref >60)
GFR, EST AFRICAN AMERICAN: 20 mL/min — AB (ref >60)
Glucose, Bld: 132 mg/dL — ABNORMAL HIGH (ref 65–99)
POTASSIUM: 4.2 mmol/L (ref 3.5–5.1)
SODIUM: 137 mmol/L (ref 135–145)

## 2015-10-06 LAB — HEPARIN LEVEL (UNFRACTIONATED): Heparin Unfractionated: 0.39 IU/mL (ref 0.30–0.70)

## 2015-10-06 LAB — MAGNESIUM: MAGNESIUM: 2.2 mg/dL (ref 1.7–2.4)

## 2015-10-06 MED ORDER — ISOSORB DINITRATE-HYDRALAZINE 20-37.5 MG PO TABS
1.0000 | ORAL_TABLET | Freq: Three times a day (TID) | ORAL | Status: DC
  Administered 2015-10-06 – 2015-10-18 (×37): 1 via ORAL
  Filled 2015-10-06 (×39): qty 1

## 2015-10-06 MED ORDER — METOPROLOL TARTRATE 50 MG PO TABS
100.0000 mg | ORAL_TABLET | Freq: Two times a day (BID) | ORAL | Status: DC
  Administered 2015-10-06 – 2015-10-10 (×8): 100 mg via ORAL
  Administered 2015-10-11 (×2): 50 mg via ORAL
  Filled 2015-10-06 (×4): qty 1
  Filled 2015-10-06: qty 2
  Filled 2015-10-06: qty 1
  Filled 2015-10-06: qty 2
  Filled 2015-10-06 (×4): qty 1

## 2015-10-06 MED ORDER — AMIODARONE HCL IN DEXTROSE 360-4.14 MG/200ML-% IV SOLN
60.0000 mg/h | INTRAVENOUS | Status: AC
  Administered 2015-10-06 (×2): 60 mg/h via INTRAVENOUS
  Filled 2015-10-06: qty 200

## 2015-10-06 MED ORDER — WHITE PETROLATUM GEL
Status: AC
  Administered 2015-10-06: 05:00:00
  Filled 2015-10-06: qty 1

## 2015-10-06 MED ORDER — AMIODARONE LOAD VIA INFUSION
150.0000 mg | Freq: Once | INTRAVENOUS | Status: AC
  Administered 2015-10-06: 150 mg via INTRAVENOUS
  Filled 2015-10-06: qty 83.34

## 2015-10-06 MED ORDER — INSULIN ASPART 100 UNIT/ML ~~LOC~~ SOLN
0.0000 [IU] | Freq: Every day | SUBCUTANEOUS | Status: DC
  Administered 2015-10-12: 2 [IU] via SUBCUTANEOUS

## 2015-10-06 MED ORDER — INSULIN ASPART 100 UNIT/ML ~~LOC~~ SOLN
0.0000 [IU] | Freq: Three times a day (TID) | SUBCUTANEOUS | Status: DC
  Administered 2015-10-06: 7 [IU] via SUBCUTANEOUS
  Administered 2015-10-06: 4 [IU] via SUBCUTANEOUS
  Administered 2015-10-07 (×2): 7 [IU] via SUBCUTANEOUS
  Administered 2015-10-07 – 2015-10-08 (×3): 4 [IU] via SUBCUTANEOUS
  Administered 2015-10-08: 7 [IU] via SUBCUTANEOUS
  Administered 2015-10-09: 3 [IU] via SUBCUTANEOUS
  Administered 2015-10-09: 4 [IU] via SUBCUTANEOUS
  Administered 2015-10-09 – 2015-10-10 (×2): 3 [IU] via SUBCUTANEOUS
  Administered 2015-10-10 – 2015-10-11 (×2): 4 [IU] via SUBCUTANEOUS
  Administered 2015-10-11 – 2015-10-13 (×3): 3 [IU] via SUBCUTANEOUS
  Administered 2015-10-13: 11 [IU] via SUBCUTANEOUS
  Administered 2015-10-13 – 2015-10-16 (×7): 4 [IU] via SUBCUTANEOUS
  Administered 2015-10-17: 7 [IU] via SUBCUTANEOUS
  Administered 2015-10-17 (×2): 3 [IU] via SUBCUTANEOUS
  Administered 2015-10-18: 4 [IU] via SUBCUTANEOUS

## 2015-10-06 MED ORDER — AMIODARONE HCL IN DEXTROSE 360-4.14 MG/200ML-% IV SOLN
30.0000 mg/h | INTRAVENOUS | Status: DC
  Administered 2015-10-06 – 2015-10-09 (×5): 30 mg/h via INTRAVENOUS
  Filled 2015-10-06 (×16): qty 200

## 2015-10-06 NOTE — Progress Notes (Signed)
SUBJECTIVE:  Feels better   OBJECTIVE:   Vitals:   Filed Vitals:   10/06/15 1000 10/06/15 1008 10/06/15 1100 10/06/15 1158  BP: 132/73 122/73    Pulse: 119 101 117   Temp:    97.9 F (36.6 C)  TempSrc:    Oral  Resp: 27 27 19    Height:      Weight:      SpO2: 95% 95% 95%    I&O's:   Intake/Output Summary (Last 24 hours) at 10/06/15 1529 Last data filed at 10/06/15 1101  Gross per 24 hour  Intake  750.5 ml  Output   1475 ml  Net -724.5 ml   TELEMETRY: Reviewed telemetry pt in atrial flutter:     PHYSICAL EXAM General: Well developed, well nourished, in no acute distress Head: Eyes PERRLA, No xanthomas.   Normal cephalic and atramatic  Lungs:   Crackles at bases Heart:   Irregularly irregular S1 S2 Pulses are 2+ & equal. Abdomen: Bowel sounds are positive, abdomen soft and non-tender without masses Extremities:   No clubbing, cyanosis or edema.  DP +1 Neuro: Alert and oriented X 3. Psych:  Good affect, responds appropriately   LABS: Basic Metabolic Panel:  Recent Labs  10/05/15 0217 10/06/15 0253  NA 139 137  K 4.1 4.2  CL 105 105  CO2 21* 19*  GLUCOSE 111* 132*  BUN 66* 77*  CREATININE 2.70* 2.59*  CALCIUM 8.2* 8.2*  MG  --  2.2  PHOS  --  5.1*   Liver Function Tests:  Recent Labs  10/05/15 0217  AST 31  ALT 23  ALKPHOS 101  BILITOT 0.5  PROT 6.1*  ALBUMIN 2.4*   No results for input(s): LIPASE, AMYLASE in the last 72 hours. CBC:  Recent Labs  10/05/15 0217 10/06/15 0253  WBC 9.9 10.2  HGB 8.6* 8.6*  HCT 27.2* 27.8*  MCV 84.5 84.8  PLT 201 246   Cardiac Enzymes:  Recent Labs  10/04/15 1059 10/04/15 1616 10/04/15 2158  TROPONINI 0.86* 0.73* 0.60*   BNP: Invalid input(s): POCBNP D-Dimer: No results for input(s): DDIMER in the last 72 hours. Hemoglobin A1C: No results for input(s): HGBA1C in the last 72 hours. Fasting Lipid Panel: No results for input(s): CHOL, HDL, LDLCALC, TRIG, CHOLHDL, LDLDIRECT in the last 72  hours. Thyroid Function Tests:  Recent Labs  10/04/15 1616  TSH 1.354   Anemia Panel: No results for input(s): VITAMINB12, FOLATE, FERRITIN, TIBC, IRON, RETICCTPCT in the last 72 hours. Coag Panel:   Lab Results  Component Value Date   INR 1.23 10/03/2015   INR 0.97 10/02/2009    RADIOLOGY: US Carotid Duplex Bilateral  09/29/2015  CLINICAL DATA:  Bilateral carotid artery stenosis. EXAM: BILATERAL CAROTID DUPLEX ULTRASOUND TECHNIQUE: Pearline Cables scale imaging, color Doppler and duplex ultrasound were performed of bilateral carotid and vertebral arteries in the neck. COMPARISON:  11/24/2014. FINDINGS: Criteria: Quantification of carotid stenosis is based on velocity parameters that correlate the residual internal carotid diameter with NASCET-based stenosis levels, using the diameter of the distal internal carotid lumen as the denominator for stenosis measurement. The following velocity measurements were obtained: RIGHT ICA:  113/16 cm/sec CCA:  123456 cm/sec SYSTOLIC ICA/CCA RATIO:  1.0 DIASTOLIC ICA/CCA RATIO:  1.5 ECA:  128 cm/sec LEFT ICA:  393/82 cm/sec CCA:  [id number] cm/sec SYSTOLIC ICA/CCA RATIO:  4.6 DIASTOLIC ICA/CCA RATIO:  5.8 ECA:  160 cm/sec RIGHT CAROTID ARTERY: Mild to moderate right carotid bifurcation atherosclerotic vascular disease. No flow limiting  stenosis. Degree of stenosis less than 50%. RIGHT VERTEBRAL ARTERY:  Patent with antegrade flow. LEFT CAROTID ARTERY: Prominent left carotid bifurcation and proximal ICA atherosclerotic vascular plaque. Degree of stenosis greater than 70%. LEFT VERTEBRAL ARTERY:  Patent antegrade flow. IMPRESSION: 1. Greater than 70 degrees stenosis left carotid bifurcation and proximal ICA. Vascular surgery consultation suggested. 2. Atherosclerotic vascular plaque right carotid bifurcation. Degree of stenosis less than 50%. Vertebral arteries are patent antegrade flow. Electronically Signed   By: Marcello Moores  Register   On: 09/29/2015 13:15   US Renal  Port  10/05/2015  CLINICAL DATA:  Chronic renal failure with acute exacerbation EXAM: RENAL ULTRASOUND COMPARISON:  September 05, 2008 FINDINGS: Right Kidney: Length: 11.2 cm. Echogenicity within normal limits. Renal cortical thickness is low normal. No mass, perinephric fluid, or hydronephrosis visualized. No sonographically demonstrable calculus or ureterectasis. Left Kidney: Length: 11.4 cm. Echogenicity within normal limits. Renal cortical thickness is low normal. No mass, perinephric fluid, or hydronephrosis visualized. No sonographically demonstrable calculus or ureterectasis. Bladder: Urinary bladder is decompressed with a Foley catheter and cannot be assessed. IMPRESSION: Low normal renal cortical thickness, a finding that may be seen with medical renal disease. No increase in renal echogenicity or evidence of obstructing focus on either side. Study otherwise unremarkable. Electronically Signed   By: Lowella Grip III M.D.   On: 10/05/2015 17:47   Dg Chest Port 1 View  10/05/2015  CLINICAL DATA:  Pulmonary edema, history of coronary artery disease, CHF, CABG. Acute respiratory failure with hypoxia. EXAM: PORTABLE CHEST 1 VIEW COMPARISON:  Portable chest x-ray of Oct 04, 2015 FINDINGS: The lungs are well-expanded. The interstitial markings remain increased. The left hemidiaphragm is slightly better demonstrated today but increased retrocardiac density persists. The cardiac silhouette remains enlarged. The pulmonary vascularity is slightly less engorged. There are post CABG changes. IMPRESSION: CHF superimposed upon COPD. Pulmonary interstitial and mild alveolar edema, slightly improved. Left basilar atelectasis or pneumonia and small left pleural effusion. Electronically Signed   By: David  Martinique M.D.   On: 10/05/2015 07:25   Dg Chest Port 1 View  10/04/2015  CLINICAL DATA:  Hypoxia.  Dyspnea EXAM: PORTABLE CHEST 1 VIEW COMPARISON:  10/03/2015 FINDINGS: Airspace opacities in the central and basilar  regions have worsened from the earlier study. Probable left pleural effusion. Moderate vascular and interstitial congestion. Moderate cardiomegaly. IMPRESSION: The findings likely represent congestive heart failure. Infectious infiltrates cannot be excluded. Electronically Signed   By: Andreas Newport M.D.   On: 10/04/2015 01:39   Dg Chest Port 1 View  10/03/2015  CLINICAL DATA:  Shortness of breath EXAM: PORTABLE CHEST 1 VIEW COMPARISON:  10/02/2015 FINDINGS: Cardiomegaly again noted. Status post CABG. Patchy airspace disease bilaterally especially lower lobes again noted. Probable small left pleural effusion with left basilar atelectasis or infiltrate. Central mild vascular congestion without convincing pulmonary edema. IMPRESSION: Patchy airspace disease bilaterally especially lower lobes again noted. Probable small left pleural effusion and left basilar atelectasis or infiltrate. Central mild vascular congestion without convincing pulmonary edema. Electronically Signed   By: Lahoma Crocker M.D.   On: 10/03/2015 08:37   Dg Chest Port 1 View  10/02/2015  CLINICAL DATA:  Acute respiratory failure. EXAM: PORTABLE CHEST 1 VIEW COMPARISON:  10/01/2015 FINDINGS: Airspace opacities persist throughout the central and basilar regions of both lungs, without significant interval change. No pneumothorax. Unchanged cardiomegaly. IMPRESSION: No significant interval change in the bilateral airspace opacities. Electronically Signed   By: Andreas Newport M.D.   On: 10/02/2015 23:10  Dg Chest Port 1 View  10/01/2015  CLINICAL DATA:  Initial evaluation for acute chest pain. EXAM: PORTABLE CHEST 1 VIEW COMPARISON:  Prior radiograph from 09/30/2015. FINDINGS: Median sternotomy wires with underlying CABG markers again noted. Cardiomegaly stable. Atheromatous plaque within the aortic arch. Lungs mildly hypoinflated with elevation left hemidiaphragm. Interval worsening and patchy right perihilar and basilar opacity, worrisome for  progressive pneumonia. Additional patchy opacity at the left lung base may reflect atelectasis or infiltrate. Mild vascular congestion without overt pulmonary edema. No definite pleural effusion, although the right costophrenic angle is incompletely visualized. No pneumothorax. No acute osseus abnormality. IMPRESSION: 1. Interval worsening in patchy right perihilar and basilar opacity, worrisome for progressive pneumonia. 2. Elevation of the left hemidiaphragm with additional patchy left basilar patchy opacity, which may reflect atelectasis or additional infiltrate. 3. Stable cardiomegaly with mild pulmonary vascular congestion without overt pulmonary edema. Electronically Signed   By: Jeannine Boga M.D.   On: 10/01/2015 05:15   Dg Chest Port 1 View  09/30/2015  CLINICAL DATA:  Dyspnea, cough, fever. EXAM: PORTABLE CHEST 1 VIEW COMPARISON:  05/31/2015 FINDINGS: There is unchanged moderate cardiomegaly. There is patchy right base opacity which could represent pneumonia. No large effusion. IMPRESSION: Patchy right lung base opacity, potentially pneumonia although hemorrhage or asymmetric edema could also produce this appearance. Electronically Signed   By: Andreas Newport M.D.   On: 09/30/2015 22:33   ASSESSMENT AND PLAN: Active Problems:  Acute respiratory failure (HCC)  Hypoxia  Elevated troponin  Chest pain  DVT (deep venous thrombosis) (HCC)  Hypoxemia  Atrial Fibrillation with RVR  1. Elevated troponin: patient presented with SOB and chest pain x 2 days. She states she was having intense sharp pain in the center of her chest with radiation to her neck and jaw. Pain occurred at rest and was not reproducible with palpation. She was found to have ? PNA at [facility] and required Bipap for hypoxia. BNP was elevated at 849 c/w CHF, troponin has been 1.15>>2.13>>1.55>>0.66>>0.77>>1.19>>0.86. She does have a history of CAD with CABG in 2005. Echo showed normal LVF with EF 55-60% with grade  3 diastolic dysfunction, mild AR, mild MR and severe pulmonary HTN. She needs ischemic evaluation since she presented with CP and elevated trop and then had another spell of acute SOB and troponin bumped again.  Respiratory status much improved so will make NPO after MN for stress test in am. 2D echo showed normal LVF with no wall motion abnormalities.  Would not pursue cath unless myoview is high risk due to underlying acute on CKD.   She is on heparin gtt/ASA/statin.   2. Acute on chronic diastolic CHF with elevated BNP: patient presented with hypoxia and found to have ? PNA and CHF. Echo shows normal LVF with no RWMAs and grade 3 diastolic dysfunction. On IV furosemide creatinine went up to 3.01 and Lasix was stopped. Now with increased crackles at bases and back on lasix. She is net neg 1.3L Creatinine improved from 3 and trending down (3.01>>2.72>>2.76>>2.7>>2.59). She is still volume overloaded by chest xray. Weight is up 3 lbs from admit. Difficult situation in that I suspect she has some underlying medical renal disease from her HTN and increased diuresis a few days ago resulted in worsening renal function. Lasix increased to 40mg  IV BID yesterday and recommend renal consult. Will increase Lasix to 80mg  BID as she is still volume overloaded on exam.  She needs aggressive treatment of her HTN. Will continue metoprolol to  75mg  BID, Cardizem gtt and continue Amiodarone gtt.As her HR is still up I will give her a bolus of Amio 150mg  IV now.  No aldactone or ACE I due to acute on CKD. Follow renal function closely. I suspect that loss of atrial kick from her afib/flutter is contributing to her CHF and her HR is still not adequately controlled despite IV Amio.  I think we need to consider TEE/DCCV at some point once respiratory status is stable as I think she will feel much better in NSR.  Pulmonary feels PNA is not an issue at this time so will diurese over the weekend.  Also need to rule out  ischemia.  Her trop was elevated on admission and was felt to be due to demand ischemia but then she had another episode of acute SOB and trop bumped again.  I will make her NPO after MN for nuclear stress test tomorrow.  If no ischemia then would change to NOAC and plan TEE/DCCV after 3 doses of Eliquis.  3. HTN: BP improved on higher dose of BB. Hydralazine and metoprolol added and now back on IV Cardizem gtt for afib.    4. New onset atrial fibrillation with RVR:  TSH is normal. HR persists in the 120's despite Cardizem gtt, BB and now amio gtt.  Will give a bolus of Amio 150mg  IV now due to persistently elevated HR.  Continue IV Heparin gtt. Her CHADS2VASC score is 5 so will need long term anticoagulation with NOAC.  If myoview shows no ischemia then change to Eliqius tomorrow after nuclear stress test and then plan TEE/DCCV on Monday after 3 doses.    I have spent a total of 40 minutes with patient reviewing notes , telemetry, EKGs, labs and examining patient as well as establishing an assessment and plan that was discussed with the patient. > 50% of time was spent in direct patient care.     Fransico Him, MD  10/06/2015  3:29 PM

## 2015-10-06 NOTE — Care Management Note (Signed)
Case Management Note  Patient Details  Name: Briana [name] MRN: [id number] Date of Birth: 12-23-1945  Subjective/Objective:   Pt admitted with acute respiratory failure and chest pain                 Action/Plan:  CIR recommendation - CSW consulted  PTA- independent from home with husband.  CM will continue to monitor for disposition needs   Expected Discharge Date:                  Expected Discharge Plan:  Home/Self Care  In-House Referral:     Discharge planning Services  CM Consult  Post Acute Care Choice:    Choice offered to:     DME Arranged:    DME Agency:     HH Arranged:    HH Agency:     Status of Service:  In process, will continue to follow  Medicare Important Message Given:  Yes Date Medicare IM Given:    Medicare IM give by:    Date Additional Medicare IM Given:    Additional Medicare Important Message give by:     If discussed at [place] of Stay Meetings, dates discussed:    Additional Comments:  Maryclare Labrador, RN 10/06/2015, 2:40 PM

## 2015-10-06 NOTE — Clinical Social Work Placement (Signed)
   CLINICAL SOCIAL WORK PLACEMENT  NOTE  Date:  10/06/2015  Patient Details  Name: Briana [name] MRN: [id number] Date of Birth: [birth year]  Clinical Social Work is seeking post-discharge placement for this patient at the [place] level of care (*CSW will initial, date and re-position this form in  chart as items are completed):      Patient/family provided with [place] Work Department's list of facilities offering this level of care within the geographic area requested by the patient (or if unable, by the patient's family).      Patient/family informed of their freedom to choose among providers that offer the needed level of care, that participate in Medicare, Medicaid or managed care program needed by the patient, have an available bed and are willing to accept the patient.      Patient/family informed of [place]'s ownership interest in [facility] and [facility], as well as of the fact that they are under no obligation to receive care at these facilities.  PASRR submitted to EDS on 10/06/15     PASRR number received on 10/06/15     Existing PASRR number confirmed on       FL2 transmitted to all facilities in geographic area requested by pt/family on 10/06/15     FL2 transmitted to all facilities within larger geographic area on 10/06/15     Patient informed that his/her managed care company has contracts with or will negotiate with certain facilities, including the following:            Patient/family informed of bed offers received.  Patient chooses bed at       Physician recommends and patient chooses bed at      Patient to be transferred to   on  .  Patient to be transferred to facility by       Patient family notified on   of transfer.  Name of family member notified:        PHYSICIAN       Additional Comment:    _______________________________________________ Judeth Horn, LCSW 10/06/2015, 3:15 PM

## 2015-10-06 NOTE — NC FL2 (Signed)
[place] LEVEL OF CARE SCREENING TOOL     IDENTIFICATION  Patient Name: Briana [name] Birthdate: 1945-08-17 Sex: female Admission Date (Current Location): 09/30/2015  [facility] and [place] Number:  Herbalist and Address:  [facility], [place] [address], [place], [place] [address]      Provider Number: [id number]  Attending Physician Name and Address:  Cherene Altes, MD  Relative Name and Phone Number:  Lynsi Deanes Spouse [phone])    Current Level of Care: Hospital Recommended Level of Care: [place] Prior Approval Number:    Date Approved/Denied:   PASRR Number: [id number]  Discharge Plan: SNF    Current Diagnoses: Patient Active Problem List   Diagnosis Date Noted  . Atrial fibrillation with rapid ventricular response ([place]) 10/05/2015  . CAP (community acquired pneumonia)   . CAD in native artery   . Pulmonary hypertension ([place])   . Atrial fibrillation with RVR ([place])   . Acute on chronic renal failure ([place])   . Uncontrolled type 2 diabetes mellitus with complication ([place])   . Physical deconditioning   . Acute respiratory failure with hypoxia ([place])   . Acute pulmonary edema (HCC)   . Diastolic CHF, acute on chronic (HCC)   . Acute blood loss anemia   . Controlled diabetes mellitus type 2 with complications ([place])   . Acute respiratory failure ([place]) 10/01/2015  . Hypoxia 10/01/2015  . Elevated troponin 10/01/2015  . Chest pain   . DVT (deep venous thrombosis) ([place])   . Hypoxemia   . Acute on chronic diastolic CHF (congestive heart failure) ([place]) 05/31/2015  . Essential hypertension, benign 03/28/2015  . Hyperlipidemia 03/28/2015  . Vitamin D deficiency 03/28/2015  . CKD (chronic kidney disease) stage 3, GFR 30-59 ml/min 03/04/2014  . Acute renal failure ([place]) 03/04/2014  . UTI (lower urinary tract infection) 03/02/2014  . Acute encephalopathy 03/02/2014  . Dehydration 03/02/2014   . Generalized weakness 03/02/2014  . Morbid obesity ([place]) 03/02/2014  . Depressed mood 03/02/2014  . Type 2 diabetes mellitus with stage 3 chronic kidney disease ([place]) 03/02/2014  . Hx of adenomatous colonic polyps 01/28/2012  . Occlusion and stenosis of carotid artery without mention of cerebral infarction 12/03/2011    Orientation RESPIRATION BLADDER Height & Weight     Situation, Place, Time, Self  O2 (6L Nasal Cannula) Continent Weight: 233 lb 14.5 oz (106.1 kg) Height:  5\' 5"  (165.1 cm)  BEHAVIORAL SYMPTOMS/MOOD NEUROLOGICAL BOWEL NUTRITION STATUS      Continent Diet  AMBULATORY STATUS COMMUNICATION OF NEEDS Skin   Limited Assist Verbally Normal                       Personal Care Assistance Level of Assistance  Bathing, Dressing Bathing Assistance: Limited assistance   Dressing Assistance: Limited assistance     Functional Limitations Info  Hearing, Sight, Speech Sight Info: Adequate Hearing Info: Adequate Speech Info: Adequate    SPECIAL CARE FACTORS FREQUENCY  PT (By licensed PT), OT (By licensed OT)     PT Frequency: min 3x/week              Contractures Contractures Info: Not present    Additional Factors Info  Code Status, Allergies, Psychotropic, Insulin Sliding Scale Code Status Info: FULL Allergies Info: Carbamazepine, Clonazepam; Gemfibrozil; Oxycodone Psychotropic Info: Zoloft Insulin Sliding Scale Info: 3x/daily        Current Medications (10/06/2015):  This is the current  hospital active medication list Current Facility-Administered Medications  Medication Dose Route Frequency Provider Last Rate Last Dose  . 0.9 %  sodium chloride infusion   Intravenous Continuous Cherene Altes, MD 10 mL/hr at 10/04/15 1030    . amiodarone (NEXTERONE PREMIX) 360-4.14 MG/200ML-% (1.8 mg/mL) IV infusion  60 mg/hr Intravenous Continuous Cherene Altes, MD 33.3 mL/hr at 10/06/15 1314 60 mg/hr at 10/06/15 1314   Followed by  . amiodarone (NEXTERONE  PREMIX) 360-4.14 MG/200ML-% (1.8 mg/mL) IV infusion  30 mg/hr Intravenous Continuous Cherene Altes, MD      . aspirin EC tablet 81 mg  81 mg Oral Daily Sueanne Margarita, MD   81 mg at 10/06/15 0858  . atorvastatin (LIPITOR) tablet 80 mg  80 mg Oral QPM Luz Brazen, MD   80 mg at 10/05/15 1812  . furosemide (LASIX) injection 40 mg  40 mg Intravenous BID Sueanne Margarita, MD   40 mg at 10/06/15 0856  . heparin ADULT infusion 100 units/mL (25000 units/250 mL)  1,900 Units/hr Intravenous Continuous Rebecka Apley, RPH 19 mL/hr at 10/06/15 1101 1,900 Units/hr at 10/06/15 1101  . HYDROcodone-acetaminophen (NORCO) 7.5-325 MG per tablet 1 tablet  1 tablet Oral Q6H PRN Kara Mead V, MD      . insulin aspart (novoLOG) injection 0-20 Units  0-20 Units Subcutaneous TID WC Cherene Altes, MD   4 Units at 10/06/15 1232  . insulin aspart (novoLOG) injection 0-5 Units  0-5 Units Subcutaneous QHS Cherene Altes, MD      . insulin aspart protamine- aspart (NOVOLOG MIX 70/30) injection 15 Units  15 Units Subcutaneous BID WC Luz Brazen, MD   15 Units at 10/06/15 0856  . isosorbide-hydrALAZINE (BIDIL) 20-37.5 MG per tablet 1 tablet  1 tablet Oral TID Cherene Altes, MD   1 tablet at 10/06/15 0948  . loratadine (CLARITIN) tablet 10 mg  10 mg Oral Daily Allie Bossier, MD   10 mg at 10/06/15 0858  . metoprolol (LOPRESSOR) tablet 100 mg  100 mg Oral BID Cherene Altes, MD      . nitroGLYCERIN (NITROSTAT) SL tablet 0.4 mg  0.4 mg Sublingual Q5 min PRN Brand Males, MD   0.4 mg at 10/04/15 1009  . sertraline (ZOLOFT) tablet 100 mg  100 mg Oral Daily Rigoberto Noel, MD   100 mg at 10/06/15 [id number]     Discharge Medications: Please see discharge summary for a list of discharge medications.  Relevant Imaging Results:  Relevant Lab Results:   Additional Information SS # [id number]  Judeth Horn, LCSW

## 2015-10-06 NOTE — Evaluation (Signed)
Physical Therapy Evaluation Patient Details Name: Briana [name] MRN: [id number] DOB: 1946/02/08 Today's Date: 10/06/2015   History of Present Illness  Pt adm with acute hypoxic Respiratory Failure in setting of bilateral pulmonary infiltrates. Favor acute Pulmonary Edema over PNA. Pt placed on bipap. PMH - DM, CAD, diastolic HF,   Clinical Impression  Pt admitted with above diagnosis and presents to PT with functional limitations due to deficits listed below (See PT problem list). Pt needs skilled PT to maximize independence and safety to allow discharge to CIR. Pt motivated to return to prior level of function. Currently decr functional activity tolerance that would make returning directly home a problem.     Follow Up Recommendations CIR    Equipment Recommendations  Other (comment) (To be determined)    Recommendations for Other Services       Precautions / Restrictions Precautions Precautions: Fall Restrictions Weight Bearing Restrictions: No      Mobility  Bed Mobility               General bed mobility comments: Pt sitting in chair  Transfers Overall transfer level: Needs assistance Equipment used: Pushed w/c Transfers: Sit to/from Stand Sit to Stand: Min assist         General transfer comment: Assist to bring hips up and for balance  Ambulation/Gait Ambulation/Gait assistance: Min assist Ambulation Distance (Feet): 80 Feet Assistive device:  (pushed w/c) Gait Pattern/deviations: Step-through pattern;Decreased step length - right;Decreased step length - left;Wide base of support Gait velocity: decr Gait velocity interpretation: Below normal speed for age/gender General Gait Details: Assist for balance and support. SpO2 90% on 6L with amb.  Stairs            Wheelchair Mobility    Modified Rankin (Stroke Patients Only)       Balance Overall balance assessment: Needs assistance Sitting-balance support: No upper extremity  supported;Feet supported Sitting balance-Leahy Scale: Good     Standing balance support: Single extremity supported Standing balance-Leahy Scale: Poor Standing balance comment: UE support and supervision for static standing                             Pertinent Vitals/Pain Pain Assessment: No/denies pain    Home Living Family/patient expects to be discharged to:: Private residence Living Arrangements: Spouse/significant other Available Help at Discharge: Family;Available PRN/intermittently Type of Home: House Home Access: Stairs to enter Entrance Stairs-Rails: Left Entrance Stairs-Number of Steps: 3 Home Layout: One level Home Equipment: Bedside commode;Shower seat;Grab bars - tub/shower;Hand held shower head;Cane - single point;Cane - quad      Prior Function Level of Independence: Independent with assistive device(s)         Comments: Used cane at times for extended distance     Hand Dominance   Dominant Hand: Right    Extremity/Trunk Assessment   Upper Extremity Assessment: Defer to OT evaluation           Lower Extremity Assessment: Generalized weakness         Communication   Communication: No difficulties  Cognition Arousal/Alertness: Awake/alert Behavior During Therapy: WFL for tasks assessed/performed Overall Cognitive Status: Within Functional Limits for tasks assessed                      General Comments      Exercises        Assessment/Plan    PT Assessment Patient needs continued PT services  PT Diagnosis Difficulty walking;Generalized weakness   PT Problem List Decreased strength;Decreased activity tolerance;Decreased balance;Decreased mobility;Cardiopulmonary status limiting activity;Obesity  PT Treatment Interventions DME instruction;Gait training;Functional mobility training;Therapeutic activities;Therapeutic exercise;Balance training;Patient/family education   PT Goals (Current goals can be found in the  Care Plan section) Acute Rehab PT Goals Patient Stated Goal: Return home when stronger PT Goal Formulation: With patient Time For Goal Achievement: 10/20/15 Potential to Achieve Goals: Good    Frequency Min 3X/week   Barriers to discharge Decreased caregiver support Husband unable to help at home    Co-evaluation               End of Session Equipment Utilized During Treatment: Gait belt;Oxygen Activity Tolerance: Patient limited by fatigue Patient left: in chair;with call bell/phone within reach;with family/visitor present           Time: 1157-1221 PT Time Calculation (min) (ACUTE ONLY): 24 min   Charges:   PT Evaluation $PT Eval Moderate Complexity: 1 Procedure PT Treatments $Gait Training: 8-22 mins   PT G Codes:        Sylva Overley October 31, 2015, 1:46 PM [employer] PT [phone]

## 2015-10-06 NOTE — Progress Notes (Signed)
PULMONARY / CRITICAL CARE MEDICINE   Name: Briana [name] MRN: [id number] DOB: 1946-01-05    ADMISSION DATE:  09/30/2015  CHIEF COMPLAINT:  Chest pain  HISTORY OF PRESENT ILLNESS:   Briana [name] is an 70 y.o. woman with a PMHx most notable for DM2 and cardiovascular disease (CAD, PVD, as well as diastolic HF) who presented to the ED at [facility] on the evening of 5/6 with 24 hours of chest pain. In ED found to have: hypoxia, a fever to 101.6, WBC of 15.5, EKG changes (lateral ST depression), mild troponin elevation (0.12). BNP was elevated to 894.0, previously was 391. She had a CXR which showed alveolar opacities, most dense in the R base. Cultures were obtained, she was started on CAP coverage, and due to the hypoxia was transferred to [facility] for tx of pneumonia.   SUBJECTIVE:  Afebrile Remained off bipap overnight ,On 6L [place], oob to chair Good UO with lasix Remains tachy    VITAL SIGNS: BP 161/63 mmHg  Pulse 124  Temp(Src) 97.9 F (36.6 C) (Oral)  Resp 28  Ht 5\' 5"  (1.651 m)  Wt 233 lb 14.5 oz (106.1 kg)  BMI 38.92 kg/m2  SpO2 94% 100% NRB HEMODYNAMICS:    VENTILATOR SETTINGS:    INTAKE / OUTPUT: I/O last 3 completed shifts: In: 1808.8 [P.O.:650; I.V.:1108.8; IV Piggyback:50] Out: 2325 [Urine:2325]  PHYSICAL EXAMINATION: General:  Elderly woman in chair, anxious Neuro:  Grossly normal motor , alert, interactive HEENT:  MMM, nasal bleed Cardiovascular:  Heart sounds dual and normal. Lungs: Crackles noted per bases; no accessory muscle use  Abdomen:  Obese, soft Musculoskeletal:  No deformities Skin:  No rashes, 1+ edema  LABS:  BMET  Recent Labs Lab 10/03/15 1030 10/05/15 0217 10/06/15 0253  NA 137 139 137  K 4.1 4.1 4.2  CL 105 105 105  CO2 18* 21* 19*  BUN 56* 66* 77*  CREATININE 2.76* 2.70* 2.59*  GLUCOSE 220* 111* 132*    Electrolytes  Recent Labs Lab 10/01/15 0413  10/03/15 1030 10/05/15 0217 10/06/15 0253  CALCIUM 9.0  < > 8.4*  8.2* 8.2*  MG 2.1  --  2.0  --  2.2  PHOS 4.0  --   --   --  5.1*  < > = values in this interval not displayed.  CBC  Recent Labs Lab 10/04/15 1616 10/05/15 0217 10/06/15 0253  WBC 10.3 9.9 10.2  HGB 9.2* 8.6* 8.6*  HCT 29.0* 27.2* 27.8*  PLT 223 201 246    Coag's  Recent Labs Lab 10/03/15 1030  INR 1.23    Sepsis Markers  Recent Labs Lab 09/30/15 2210 10/01/15 0413 10/01/15 0815 10/02/15 0223 10/03/15 0239  LATICACIDVEN 1.68  --  0.8  --   --   PROCALCITON  --  0.18  --  0.69 0.62    ABG  Recent Labs Lab 10/03/15 0852 10/04/15 0113 10/04/15 0554  PHART 7.302* 7.281* 7.368  PCO2ART 35.8 37.2 33.4*  PO2ART 95.0 62.0* 84.5    Liver Enzymes  Recent Labs Lab 10/02/15 0223 10/03/15 1030 10/05/15 0217  AST 15 18 31   ALT 10* 15 23  ALKPHOS 55 72 101  BILITOT 0.4 0.6 0.5  ALBUMIN 2.5* 2.5* 2.4*    Cardiac Enzymes  Recent Labs Lab 10/04/15 1059 10/04/15 1616 10/04/15 2158  TROPONINI 0.86* 0.73* 0.60*    Glucose  Recent Labs Lab 10/05/15 0859 10/05/15 1138 10/05/15 1619 10/05/15 1924 10/05/15 2321 10/06/15 0400  GLUCAP 141* 133*  176* 198* 114* 144*    Imaging US Renal Port  10/05/2015  CLINICAL DATA:  Chronic renal failure with acute exacerbation EXAM: RENAL ULTRASOUND COMPARISON:  September 05, 2008 FINDINGS: Right Kidney: Length: 11.2 cm. Echogenicity within normal limits. Renal cortical thickness is low normal. No mass, perinephric fluid, or hydronephrosis visualized. No sonographically demonstrable calculus or ureterectasis. Left Kidney: Length: 11.4 cm. Echogenicity within normal limits. Renal cortical thickness is low normal. No mass, perinephric fluid, or hydronephrosis visualized. No sonographically demonstrable calculus or ureterectasis. Bladder: Urinary bladder is decompressed with a Foley catheter and cannot be assessed. IMPRESSION: Low normal renal cortical thickness, a finding that may be seen with medical renal disease. No  increase in renal echogenicity or evidence of obstructing focus on either side. Study otherwise unremarkable. Electronically Signed   By: Lowella Grip III M.D.   On: 10/05/2015 17:47      STUDIES:  Echo 5/8 nml LVEF, gr 3 DD, RVSP 69 EKG -5/9 a-fib with RVR, RBBB,T-wave abnormality 5/11 Renal US >>  Medical renal dz  CULTURES: Blood 5/6 x2 >>ng Urine 5/6 >>ng  ANTIBIOTICS: Azithromycin 5/6 >>5/10 Ceftriaxone 5/6 >>  SIGNIFICANT EVENTS: Transferred to [facility] MICU for hyxpoxia 5/9/>>A-Fib w/ RVR  5/9 Chest pain ,Hb drop again this am to 7.9 5/10 bipap  5/10 Dyspneic after pRBC requiring bipap  LINES/TUBES: None  DISCUSSION: 70 year old female w/ acute hypoxic resp failure in setting of acute onset of diffuse pulmonary infiltrates. Her story is more c/w cardiac than pneumonia. She had been w/out her lasix for at least 3d prior. Doubt PE; we have a good explanation for her hypoxia. Episode of A-fib with RVR  5/9, managed by [facility] and Cards. Worsening edema after pRBC transfusion 5/9  ASSESSMENT / PLAN:  PULMONARY A: Acute hypoxic Respiratory Failure in setting of bilateral pulmonary infiltrates. Favor acute Pulmonary Edema over PNA LE dopplers neg P:   Taper Arrey as tolerated Bipap prn VQ scan will not be helpful here    CARDIOVASCULAR A:  Hx of CAD Acute on chronic diastolic HF HTN emergency w/ associated pulmonary edema -Off labetolol gtt  EKG changes  afib w/ RVR Troponin peak 1.4  P:  cards following Keep statin Heparin gtt per cards - watch nasal bleed Ct  home lopressor and hydralazine  Prn Lopressor for rate control Ischemia eval in future   RENAL A:   Acute on Chronic renal insuffiencey: Scr rising Metabolic acidosis - resolved P:    Tend I&O Trend BUN/Creat   INFECTIOUS A:   Possible CAP Worsening CXR , likely due to fluid -now improved P:    ct Rocephin x 7ds    ENDOCRINE A:   DM2 -   With hyperglycemia  P:   Cut home insulin  in 1/2.  CBG's Q4    Summary -  Probability for PE appears to be very low given bilateral infiltrates, alternative causes for hypoxia and negative Doppler Treating for both CHF & CAP  PCCM available as needed Can transfer to [place] MD. [facility]. [place] [facility] Pager [phone] If no response call [phone]    10/06/2015

## 2015-10-06 NOTE — Progress Notes (Signed)
Inpatient Rehabilitation  Per PT request, patient was screened by Glena Pharris for appropriateness for an Inpatient Acute Rehab consult.  At this time we are recommending an Inpatient Rehab consult.  Please order if you are agreeable.    Kemar Pandit, M.A., CCC/SLP Admission Coordinator  [facility]  Cell [phone]  

## 2015-10-06 NOTE — Progress Notes (Signed)
Amiodarone Drug - Drug Interaction Consult Note  Recommendations: continue as ordered  Amiodarone is metabolized by the cytochrome P450 system and therefore has the potential to cause many drug interactions. Amiodarone has an average plasma half-life of 50 days (range 20 to 100 days).   There is potential for drug interactions to occur several weeks or months after stopping treatment and the onset of drug interactions may be slow after initiating amiodarone.   [x]  Statins: Increased risk of myopathy. Simvastatin- restrict dose to 20mg  daily. Other statins: counsel patients to report any muscle pain or weakness immediately.  []  Anticoagulants: Amiodarone can increase anticoagulant effect. Consider warfarin dose reduction. Patients should be monitored closely and the dose of anticoagulant altered accordingly, remembering that amiodarone levels take several weeks to stabilize.  []  Antiepileptics: Amiodarone can increase plasma concentration of phenytoin, the dose should be reduced. Note that small changes in phenytoin dose can result in large changes in levels. Monitor patient and counsel on signs of toxicity.  []  Beta blockers: increased risk of bradycardia, AV block and myocardial depression. Sotalol - avoid concomitant use.  []   Calcium channel blockers (diltiazem and verapamil): increased risk of bradycardia, AV block and myocardial depression.  []   Cyclosporine: Amiodarone increases levels of cyclosporine. Reduced dose of cyclosporine is recommended.  []  Digoxin dose should be halved when amiodarone is started.  [x]  Diuretics: increased risk of cardiotoxicity if hypokalemia occurs.  []  Oral hypoglycemic agents (glyburide, glipizide, glimepiride): increased risk of hypoglycemia. Patient's glucose levels should be monitored closely when initiating amiodarone therapy.   []  Drugs that prolong the QT interval:  Torsades de pointes risk may be increased with concurrent use - avoid if possible.   Monitor QTc, also keep magnesium/potassium WNL if concurrent therapy can't be avoided. Marland Kitchen Antibiotics: e.g. fluoroquinolones, erythromycin. . Antiarrhythmics: e.g. quinidine, procainamide, disopyramide, sotalol. . Antipsychotics: e.g. phenothiazines, haloperidol.  . Lithium, tricyclic antidepressants, and methadone. Thank You,  Remigio Eisenmenger D. Mina Marble, PharmD, BCPS Pager:  [phone] 10/06/2015, 9:54 AM

## 2015-10-06 NOTE — Progress Notes (Signed)
ANTICOAGULATION CONSULT NOTE - Follow Up Consult  Pharmacy Consult:  Heparin Indication: chest pain/ACS  Allergies  Allergen Reactions  . Carbamazepine Other (See Comments)    Reaction to tegretol - loopy  . Clonazepam Other (See Comments)    Unknown reaction  . Gemfibrozil Other (See Comments)    Patient is not aware of this allergy but states that she has side effects to a cholesterol medication in the past  . Macrobid [Nitrofurantoin] Nausea And Vomiting  . Oxycodone Other (See Comments)    hallucinations    Patient Measurements: Height: 5\' 5"  (165.1 cm) Weight: 233 lb 14.5 oz (106.1 kg) IBW/kg (Calculated) : 57 Heparin Dosing Weight: 81 kg  Vital Signs: Temp: 97.9 F (36.6 C) (05/12 0706) Temp Source: Oral (05/12 0706) BP: 122/73 mmHg (05/12 1008) Pulse Rate: 101 (05/12 1008)  Labs:  Recent Labs  10/03/15 1030  10/04/15 1059 10/04/15 1616 10/04/15 2158 10/05/15 0217 10/06/15 0253  HGB 7.9*  < >  --  9.2*  --  8.6* 8.6*  HCT 25.0*  < >  --  29.0*  --  27.2* 27.8*  PLT 198  < >  --  223  --  201 246  LABPROT 15.7*  --   --   --   --   --   --   INR 1.23  --   --   --   --   --   --   HEPARINUNFRC  --   < >  --  0.31  --  0.40 0.39  CREATININE 2.76*  --   --   --   --  2.70* 2.59*  TROPONINI 0.53*  0.53*  < > 0.86* 0.73* 0.60*  --   --   < > = values in this interval not displayed.  Estimated Creatinine Clearance: 24.4 mL/min (by C-G formula based on Cr of 2.59).   Assessment: 71 YOF continue on IV heparin for ACS.  Heparin level sub-therapeutic; no bleeding reported.   Goal of Therapy:  Heparin level 0.3-0.7 units/ml Monitor platelets by anticoagulation protocol: Yes    Plan:  - Continue heparin gtt at 1900 units/hr - Daily HL / CBC - F/U long-term AC plan   Elissia Spiewak D. Mina Marble, PharmD, BCPS Pager:  [phone] 10/06/2015, 10:33 AM

## 2015-10-06 NOTE — Progress Notes (Signed)
[facility] TEAM 1 - Stepdown/ICU TEAM  Briana [name]  [id number] DOB: 1945-07-24 DOA: 09/30/2015 PCP: Antionette Fairy, PA-C    Brief Narrative:  70 y.o. F Hx Depression with Anxiety, DM2 with neuropathy, CAD, PVD, Chronic Diastolic CHF, Heart murmur, OSA, HTN, HLD, Adenomatous colon polyps, and CKD who presented to the ED at [facility] on the evening of 5/6 with 24 hours of chest pain.  In the ED she was found to have hypoxia, a fever to 101.6, WBC of 15.5, lateral ST depression on EKG, mild troponin elevation (0.12), and BNP 894.0. CXR suggested alveolar opacities, most dense in the Rt base. Cultures were obtained, she was started on CAP coverage, and due to the hypoxia she was transferred to [facility] for tx of pneumonia.  Significant Events: 5/7 Transferred to [facility] MICU for hyxpoxia 5/9 A-Fib w/ RVR  5/9 Chest pain - Hgb dropped  5/10 BIPAP required after PRBC transfusion 5/12 RVR persists   Assessment & Plan:  Acute hypoxic Respiratory Failure w/ B pulmonary infiltrates - PNA + Pulmonary Edema  -completed 5 day course of empiric abx   Acute on chronic severe (grade 3) diastolic HF -cont diuresis - net negative ~160cc since admit but weight climbing  [facility] Weights   10/04/15 0500 10/05/15 0329 10/06/15 0357  Weight: 105.5 kg (232 lb 9.4 oz) 105.3 kg (232 lb 2.3 oz) 106.1 kg (233 lb 14.5 oz)    HTN emergency  BP not yet at goal - adjust med tx and follow   New onset A-Fib w/ RVR / RBBB -rate poorly controlled - initiate IV amio as per Cards discussion - CHA2DS2 VASc score is 6+ therefore cont IV heparin and watch Hgb   Acute normocytic anemia of unclear etiology  -hemoccult negative - no evident acute blood loss - Hgb stable - follow   Recent Labs Lab 10/04/15 0121 10/04/15 0601 10/04/15 0808 10/04/15 1616 10/05/15 0217 10/06/15 0253  HGB 9.1* 8.4* 8.3* 9.2* 8.6* 8.6*    ?PE -LE dopplers neg -doubt PE - her hypoxia is explained by other active issues noted above    CAD s/p CABG 2005 Cards has suggested outpt eval   Acute on CKD -baseline Cr ~ 1.6 - 2   Recent Labs Lab 10/01/15 0413 10/02/15 0223 10/03/15 0239 10/03/15 1030 10/05/15 0217 10/06/15 0253  CREATININE 2.18* 3.01* 2.72* 2.76* 2.70* 2.59*   DM2  -4/21 A1c 6.9  -CBG reasonably controlled  Depression w/ anxiety  -cont zoloft   Obesity - Body mass index is 38.92 kg/(m^2).  DVT prophylaxis: heparin gtt  Code Status: FULL CODE Family Communication: no family present at time of exam today  Disposition Plan: SDU on amio gtt  Consultants:  PCCM [facility]   Procedures:  5/7 B LE Doppler - Negative DVT/SVT 5/8 TTE EF 55-60% - grade 3 diastolic dysfunction - Left atrium: severely dilated. - Tricuspid valve moderate regurgitation - PA peak pressure: 69 mm Hg (S)  Antimicrobials:  Azithromycin 5/6 > 5/09 Ceftriaxone 5/6 > 5/10  Subjective: Pt is feeling much better today.  She denies current sob.  She asks that her foley be removed.  She denies cp, n/v, or abdom pain.    Objective: Blood pressure 161/63, pulse 124, temperature 97.9 F (36.6 C), temperature source Oral, resp. rate 28, height 5\' 5"  (1.651 m), weight 106.1 kg (233 lb 14.5 oz), SpO2 94 %.  Intake/Output Summary (Last 24 hours) at 10/06/15 0846 Last data filed at 10/06/15 0600  Gross  per 24 hour  Intake 1016.75 ml  Output   1475 ml  Net -458.25 ml   Filed Weights   10/04/15 0500 10/05/15 0329 10/06/15 0357  Weight: 105.5 kg (232 lb 9.4 oz) 105.3 kg (232 lb 2.3 oz) 106.1 kg (233 lb 14.5 oz)    Examination: General: No acute respiratory distress  Lungs: distant bs th/o - no wheeze - diffuse fine crackles  Cardiovascular: tachycardic at 130bpm - irreg irreg  Abdomen: Nontender, overweight, soft, bowel sounds positive, no rebound, no ascites, no appreciable mass Extremities: No significant cyanosis, or clubbing;  trace edema bilateral lower extremities  CBC:  Recent Labs Lab 09/30/15 2140   10/03/15 1030  10/04/15 0601 10/04/15 0808 10/04/15 1616 10/05/15 0217 10/06/15 0253  WBC 15.5*  < > 11.4*  --  10.4  --  10.3 9.9 10.2  NEUTROABS 11.7*  --  9.4*  --   --   --   --   --   --   HGB 10.6*  < > 7.9*  < > 8.4* 8.3* 9.2* 8.6* 8.6*  HCT 33.5*  < > 25.0*  < > 25.9* 26.0* 29.0* 27.2* 27.8*  MCV 87.5  < > 87.7  --  85.8  --  84.3 84.5 84.8  PLT 215  < > 198  --  190  --  223 201 246  < > = values in this interval not displayed.   Basic Metabolic Panel:  Recent Labs Lab 10/01/15 0413 10/02/15 0223 10/03/15 0239 10/03/15 1030 10/05/15 0217 10/06/15 0253  NA 137 134* 135 137 139 137  K 4.7 4.5 4.3 4.1 4.1 4.2  CL 107 105 103 105 105 105  CO2 19* 17* 19* 18* 21* 19*  GLUCOSE 242* 212* 190* 220* 111* 132*  BUN 38* 49* 55* 56* 66* 77*  CREATININE 2.18* 3.01* 2.72* 2.76* 2.70* 2.59*  CALCIUM 9.0 7.9* 7.9* 8.4* 8.2* 8.2*  MG 2.1  --   --  2.0  --  2.2  PHOS 4.0  --   --   --   --  5.1*   GFR: Estimated Creatinine Clearance: 24.4 mL/min (by C-G formula based on Cr of 2.59).  Liver Function Tests:  Recent Labs Lab 09/30/15 2140 10/02/15 0223 10/03/15 1030 10/05/15 0217  AST 16 15 18 31   ALT 11* 10* 15 23  ALKPHOS 72 55 72 101  BILITOT 0.7 0.4 0.6 0.5  PROT 7.4 5.6* 6.4* 6.1*  ALBUMIN 3.6 2.5* 2.5* 2.4*   Coagulation Profile:  Recent Labs Lab 10/03/15 1030  INR 1.23    Cardiac Enzymes:  Recent Labs Lab 10/03/15 1712 10/04/15 0121 10/04/15 1059 10/04/15 1616 10/04/15 2158  TROPONINI 1.41* 1.19* 0.86* 0.73* 0.60*   CBG:  Recent Labs Lab 10/05/15 1619 10/05/15 1924 10/05/15 2321 10/06/15 0400 10/06/15 0708  GLUCAP 176* 198* 114* 144* 142*    Thyroid Function Tests:  Recent Labs  10/04/15 1616  TSH 1.354    Recent Results (from the past 240 hour(s))  Blood Culture (routine x 2)     Status: None   Collection Time: 09/30/15 10:14 PM  Result Value Ref Range Status   Specimen Description BLOOD LEFT ARM DRAWN BY RN  Final    Special Requests BOTTLES DRAWN AEROBIC AND ANAEROBIC 6CC  Final   Culture NO GROWTH 5 DAYS  Final   Report Status 10/05/2015 FINAL  Final  Blood Culture (routine x 2)     Status: None   Collection Time: 09/30/15 10:19  PM  Result Value Ref Range Status   Specimen Description BLOOD RIGHT HAND  Final   Special Requests BOTTLES DRAWN AEROBIC ONLY 6CC  Final   Culture NO GROWTH 5 DAYS  Final   Report Status 10/05/2015 FINAL  Final  Urine culture     Status: None   Collection Time: 09/30/15 10:35 PM  Result Value Ref Range Status   Specimen Description URINE, CATHETERIZED  Final   Special Requests NONE  Final   Culture   Final    NO GROWTH 2 DAYS Performed at [facility]    Report Status 10/03/2015 FINAL  Final  MRSA PCR Screening     Status: None   Collection Time: 10/01/15  7:48 AM  Result Value Ref Range Status   MRSA by PCR NEGATIVE NEGATIVE Final    Comment:        The GeneXpert MRSA Assay (FDA approved for NASAL specimens only), is one component of a comprehensive MRSA colonization surveillance program. It is not intended to diagnose MRSA infection nor to guide or monitor treatment for MRSA infections.      Scheduled Meds: . aspirin EC  81 mg Oral Daily  . atorvastatin  80 mg Oral QPM  . diltiazem  60 mg Oral Q6H  . furosemide  40 mg Intravenous BID  . hydrALAZINE  100 mg Oral Q8H  . insulin aspart  0-15 Units Subcutaneous Q4H  . insulin aspart protamine- aspart  15 Units Subcutaneous BID WC  . loratadine  10 mg Oral Daily  . metoprolol tartrate  75 mg Oral BID  . sertraline  100 mg Oral Daily   Continuous Infusions: . sodium chloride 10 mL/hr at 10/04/15 1030  . heparin 1,900 Units/hr (10/05/15 2138)     LOS: 5 days   Time spent: 35 minutes   Cherene Altes, MD [facility] Office  [phone] Pager - Text Page per Shea Evans as per below:  On-Call/Text Page:      [email or web address]      password [email or web address]  If 7PM-7AM, please contact  night-coverage [email or web address] Password [facility] 10/06/2015, 8:46 AM

## 2015-10-07 LAB — COMPREHENSIVE METABOLIC PANEL WITH GFR
ALT: 29 U/L (ref 14–54)
AST: 30 U/L (ref 15–41)
Albumin: 2.4 g/dL — ABNORMAL LOW (ref 3.5–5.0)
Alkaline Phosphatase: 111 U/L (ref 38–126)
Anion gap: 13 (ref 5–15)
BUN: 81 mg/dL — ABNORMAL HIGH (ref 6–20)
CO2: 20 mmol/L — ABNORMAL LOW (ref 22–32)
Calcium: 8.3 mg/dL — ABNORMAL LOW (ref 8.9–10.3)
Chloride: 105 mmol/L (ref 101–111)
Creatinine, Ser: 2.86 mg/dL — ABNORMAL HIGH (ref 0.44–1.00)
GFR calc Af Amer: 18 mL/min — ABNORMAL LOW
GFR calc non Af Amer: 16 mL/min — ABNORMAL LOW
Glucose, Bld: 129 mg/dL — ABNORMAL HIGH (ref 65–99)
Potassium: 4.2 mmol/L (ref 3.5–5.1)
Sodium: 138 mmol/L (ref 135–145)
Total Bilirubin: 0.3 mg/dL (ref 0.3–1.2)
Total Protein: 6.1 g/dL — ABNORMAL LOW (ref 6.5–8.1)

## 2015-10-07 LAB — CBC
HCT: 27.6 % — ABNORMAL LOW (ref 36.0–46.0)
HEMOGLOBIN: 8.9 g/dL — AB (ref 12.0–15.0)
MCH: 27.8 pg (ref 26.0–34.0)
MCHC: 32.2 g/dL (ref 30.0–36.0)
MCV: 86.3 fL (ref 78.0–100.0)
Platelets: 299 10*3/uL (ref 150–400)
RBC: 3.2 MIL/uL — ABNORMAL LOW (ref 3.87–5.11)
RDW: 15.2 % (ref 11.5–15.5)
WBC: 11.1 10*3/uL — ABNORMAL HIGH (ref 4.0–10.5)

## 2015-10-07 LAB — GLUCOSE, CAPILLARY
Glucose-Capillary: 190 mg/dL — ABNORMAL HIGH (ref 65–99)
Glucose-Capillary: 218 mg/dL — ABNORMAL HIGH (ref 65–99)
Glucose-Capillary: 206 mg/dL — ABNORMAL HIGH (ref 65–99)
Glucose-Capillary: 155 mg/dL — ABNORMAL HIGH (ref 65–99)

## 2015-10-07 LAB — HEPARIN LEVEL (UNFRACTIONATED): Heparin Unfractionated: 0.49 [IU]/mL (ref 0.30–0.70)

## 2015-10-07 MED ORDER — DILTIAZEM HCL 30 MG PO TABS
30.0000 mg | ORAL_TABLET | Freq: Three times a day (TID) | ORAL | Status: DC
  Administered 2015-10-07 – 2015-10-08 (×3): 30 mg via ORAL
  Filled 2015-10-07 (×4): qty 1

## 2015-10-07 MED ORDER — DILTIAZEM HCL 30 MG PO TABS: 30.0000 mg | ORAL_TABLET | Freq: Four times a day (QID) | ORAL | Status: DC

## 2015-10-07 NOTE — Progress Notes (Signed)
[place] TEAM 1 - Stepdown/ICU TEAM  Briana [name]  [id number] DOB: 1946/03/27 DOA: 09/30/2015 PCP: Antionette Fairy, PA-C    Brief Narrative:  70 y.o. F Hx Depression with Anxiety, DM2 with neuropathy, CAD, PVD, Chronic Diastolic CHF, Heart murmur, OSA, HTN, HLD, Adenomatous colon polyps, and CKD who presented to the ED at [facility] on the evening of 5/6 with 24 hours of chest pain.  In the ED she was found to have hypoxia, a fever to 101.6, WBC of 15.5, lateral ST depression on EKG, mild troponin elevation (0.12), and BNP 894.0. CXR suggested alveolar opacities, most dense in the Rt base. Cultures were obtained, she was started on CAP coverage, and due to the hypoxia she was transferred to [facility] for tx of pneumonia.  Significant Events: 5/7 Transferred to [facility] MICU for hyxpoxia 5/9 A-Fib w/ RVR  5/9 Chest pain - Hgb dropped  5/10 BIPAP required after PRBC transfusion 5/12 RVR persists   Assessment & Plan:  Acute hypoxic Respiratory Failure w/ B pulmonary infiltrates - PNA + Pulmonary Edema  -completed 5 day course of empiric abx - hypoxia improved but still requiring 4L [place] to keep sats in mid/low 90s  Acute on chronic severe (grade 3) diastolic HF -cont diuresis - net negative ~900cc since admit - wgt now trending down  [facility] Weights   10/05/15 0329 10/06/15 0357 10/07/15 0233  Weight: 105.3 kg (232 lb 2.3 oz) 106.1 kg (233 lb 14.5 oz) 104.3 kg (229 lb 15 oz)    HTN emergency  BP improved - follow w/o change in tx today   New onset A-Fib w/ RVR / RBBB -rate control improve w/ IV amio but not yet at goal - resume low dose oral cardizem - CHA2DS2 VASc score is 6+ therefore cont IV heparin and watch Hgb - possible DCCV Monday   Acute normocytic anemia of unclear etiology  -hemoccult negative - no evident acute blood loss - Hgb stable - transition to eliquis after stress test completed   Recent Labs Lab 10/04/15 0601 10/04/15 0808 10/04/15 1616 10/05/15 0217  10/06/15 0253 10/07/15 0312  HGB 8.4* 8.3* 9.2* 8.6* 8.6* 8.9*    ?PE -LE dopplers neg -doubt PE - her hypoxia is explained by other active issues noted above   CAD s/p CABG 2005 Cards has ordered a myoview for today   Acute on CKD -baseline Cr ~ 1.6 - 2 - not tolerating diuresis too well - renal US w/o acute findings   Recent Labs Lab 10/02/15 0223 10/03/15 0239 10/03/15 1030 10/05/15 0217 10/06/15 0253 10/07/15 0312  CREATININE 3.01* 2.72* 2.76* 2.70* 2.59* 2.86*   DM2  -4/21 A1c 6.9 - CBG reasonably controlled  Depression w/ anxiety  -cont zoloft   Obesity - Body mass index is 38.26 kg/(m^2).  DVT prophylaxis: heparin gtt  Code Status: FULL CODE Family Communication: spoke w/ daughter at bedside   Disposition Plan: SDU on amio gtt  Consultants:  P[facility]   Procedures:  5/7 B LE Doppler - Negative DVT/SVT 5/8 TTE EF 55-60% - grade 3 diastolic dysfunction - Left atrium: severely dilated - Tricuspid valve moderate regurgitation - PA peak pressure: 69 mm Hg (S)  Antimicrobials:  Azithromycin 5/6 > 5/09 Ceftriaxone 5/6 > 5/10  Subjective: No new complaints today.  States she "feels great and is ready to go home."  Denies cp, n/v, sob, or abdom pain.    Objective: Blood pressure 144/81, pulse 113, temperature 98.2 F (36.8 C), temperature  source Oral, resp. rate 23, height 5\' 5"  (1.651 m), weight 104.3 kg (229 lb 15 oz), SpO2 94 %.  Intake/Output Summary (Last 24 hours) at 10/07/15 1057 Last data filed at 10/07/15 0700  Gross per 24 hour  Intake 1407.07 ml  Output    900 ml  Net 507.07 ml   Filed Weights   10/05/15 0329 10/06/15 0357 10/07/15 0233  Weight: 105.3 kg (232 lb 2.3 oz) 106.1 kg (233 lb 14.5 oz) 104.3 kg (229 lb 15 oz)    Examination: General: no acute respiratory distress  Lungs: distant bs th/o - diffuse fine crackles persist  Cardiovascular: tachycardic at 110bpm - irreg irreg  Abdomen: nontender, overweight, soft, bowel  sounds positive, no rebound, no appreciable mass Extremities: no significant cyanosis, or clubbing;  trace edema bilateral lower extremities persists   CBC:  Recent Labs Lab 09/30/15 2140  10/03/15 1030  10/04/15 0601 10/04/15 0808 10/04/15 1616 10/05/15 0217 10/06/15 0253 10/07/15 0312  WBC 15.5*  < > 11.4*  --  10.4  --  10.3 9.9 10.2 11.1*  NEUTROABS 11.7*  --  9.4*  --   --   --   --   --   --   --   HGB 10.6*  < > 7.9*  < > 8.4* 8.3* 9.2* 8.6* 8.6* 8.9*  HCT 33.5*  < > 25.0*  < > 25.9* 26.0* 29.0* 27.2* 27.8* 27.6*  MCV 87.5  < > 87.7  --  85.8  --  84.3 84.5 84.8 86.3  PLT 215  < > 198  --  190  --  223 201 246 299  < > = values in this interval not displayed.   Basic Metabolic Panel:  Recent Labs Lab 10/01/15 0413  10/03/15 0239 10/03/15 1030 10/05/15 0217 10/06/15 0253 10/07/15 0312  NA 137  < > 135 137 139 137 138  K 4.7  < > 4.3 4.1 4.1 4.2 4.2  CL 107  < > 103 105 105 105 105  CO2 19*  < > 19* 18* 21* 19* 20*  GLUCOSE 242*  < > 190* 220* 111* 132* 129*  BUN 38*  < > 55* 56* 66* 77* 81*  CREATININE 2.18*  < > 2.72* 2.76* 2.70* 2.59* 2.86*  CALCIUM 9.0  < > 7.9* 8.4* 8.2* 8.2* 8.3*  MG 2.1  --   --  2.0  --  2.2  --   PHOS 4.0  --   --   --   --  5.1*  --   < > = values in this interval not displayed. GFR: Estimated Creatinine Clearance: 21.9 mL/min (by C-G formula based on Cr of 2.86).  Liver Function Tests:  Recent Labs Lab 09/30/15 2140 10/02/15 0223 10/03/15 1030 10/05/15 0217 10/07/15 0312  AST 16 15 18 31 30   ALT 11* 10* 15 23 29   ALKPHOS 72 55 72 101 111  BILITOT 0.7 0.4 0.6 0.5 0.3  PROT 7.4 5.6* 6.4* 6.1* 6.1*  ALBUMIN 3.6 2.5* 2.5* 2.4* 2.4*   Coagulation Profile:  Recent Labs Lab 10/03/15 1030  INR 1.23    Cardiac Enzymes:  Recent Labs Lab 10/03/15 1712 10/04/15 0121 10/04/15 1059 10/04/15 1616 10/04/15 2158  TROPONINI 1.41* 1.19* 0.86* 0.73* 0.60*   CBG:  Recent Labs Lab 10/06/15 0708 10/06/15 1157  10/06/15 1616 10/06/15 2119 10/07/15 0831  GLUCAP 142* 185* 201* 125* 190*    Thyroid Function Tests:  Recent Labs  10/04/15 1616  TSH 1.354  Recent Results (from the past 240 hour(s))  Blood Culture (routine x 2)     Status: None   Collection Time: 09/30/15 10:14 PM  Result Value Ref Range Status   Specimen Description BLOOD LEFT ARM DRAWN BY RN  Final   Special Requests BOTTLES DRAWN AEROBIC AND ANAEROBIC 6CC  Final   Culture NO GROWTH 5 DAYS  Final   Report Status 10/05/2015 FINAL  Final  Blood Culture (routine x 2)     Status: None   Collection Time: 09/30/15 10:19 PM  Result Value Ref Range Status   Specimen Description BLOOD RIGHT HAND  Final   Special Requests BOTTLES DRAWN AEROBIC ONLY [place]  Final   Culture NO GROWTH 5 DAYS  Final   Report Status 10/05/2015 FINAL  Final  Urine culture     Status: None   Collection Time: 09/30/15 10:35 PM  Result Value Ref Range Status   Specimen Description URINE, CATHETERIZED  Final   Special Requests NONE  Final   Culture   Final    NO GROWTH 2 DAYS Performed at [facility]    Report Status 10/03/2015 FINAL  Final  MRSA PCR Screening     Status: None   Collection Time: 10/01/15  7:48 AM  Result Value Ref Range Status   MRSA by PCR NEGATIVE NEGATIVE Final    Comment:        The GeneXpert MRSA Assay (FDA approved for NASAL specimens only), is one component of a comprehensive MRSA colonization surveillance program. It is not intended to diagnose MRSA infection nor to guide or monitor treatment for MRSA infections.      Scheduled Meds: . aspirin EC  81 mg Oral Daily  . atorvastatin  80 mg Oral QPM  . furosemide  40 mg Intravenous BID  . insulin aspart  0-20 Units Subcutaneous TID WC  . insulin aspart  0-5 Units Subcutaneous QHS  . insulin aspart protamine- aspart  15 Units Subcutaneous BID WC  . isosorbide-hydrALAZINE  1 tablet Oral TID  . loratadine  10 mg Oral Daily  . metoprolol tartrate  100 mg Oral  BID  . sertraline  100 mg Oral Daily   Continuous Infusions: . sodium chloride 10 mL/hr at 10/07/15 0600  . amiodarone 30 mg/hr (10/07/15 0600)  . heparin 1,900 Units/hr (10/07/15 0600)     LOS: 6 days   Time spent: 35 minutes   Cherene Altes, MD [facility] Office  [phone] Pager - Text Page per Amion as per below:  On-Call/Text Page:      [email or web address]      password [email or web address]  If 7PM-7AM, please contact night-coverage [email or web address] Password [facility] 10/07/2015, 10:57 AM

## 2015-10-07 NOTE — Progress Notes (Signed)
ANTICOAGULATION CONSULT NOTE - Follow Up Consult  Pharmacy Consult:  Heparin Indication: chest pain/ACS  Allergies  Allergen Reactions  . Carbamazepine Other (See Comments)    Reaction to tegretol - loopy  . Clonazepam Other (See Comments)    Unknown reaction  . Gemfibrozil Other (See Comments)    Patient is not aware of this allergy but states that she has side effects to a cholesterol medication in the past  . Macrobid [Nitrofurantoin] Nausea And Vomiting  . Oxycodone Other (See Comments)    hallucinations    Patient Measurements: Height: 5\' 5"  (165.1 cm) Weight: 229 lb 15 oz (104.3 kg) IBW/kg (Calculated) : 57 Heparin Dosing Weight: 81 kg  Vital Signs: Temp: 98.2 F (36.8 C) (05/13 0838) Temp Source: Oral (05/13 0838) BP: 144/81 mmHg (05/13 0800) Pulse Rate: 113 (05/13 0800)  Labs:  Recent Labs  10/04/15 1059  10/04/15 1616 10/04/15 2158 10/05/15 0217 10/06/15 0253 10/07/15 0312  HGB  --   < > 9.2*  --  8.6* 8.6* 8.9*  HCT  --   < > 29.0*  --  27.2* 27.8* 27.6*  PLT  --   < > 223  --  201 246 299  HEPARINUNFRC  --   < > 0.31  --  0.40 0.39 0.49  CREATININE  --   --   --   --  2.70* 2.59* 2.86*  TROPONINI 0.86*  --  0.73* 0.60*  --   --   --   < > = values in this interval not displayed.  Estimated Creatinine Clearance: 21.9 mL/min (by C-G formula based on Cr of 2.86).   Assessment: [name] continue on IV heparin for ACS.  Heparin level sub-therapeutic; no bleeding reported.   Goal of Therapy:  Heparin level 0.3-0.7 units/ml Monitor platelets by anticoagulation protocol: Yes    Plan:  - Continue heparin gtt at 1900 units/hr - Daily HL / CBC - F/U long-term AC plan   [name] D. Mina Marble, PharmD, BCPS Pager:  [phone] 10/07/2015, 10:33 AM

## 2015-10-08 LAB — RENAL FUNCTION PANEL
ALBUMIN: 2.4 g/dL — AB (ref 3.5–5.0)
ANION GAP: 14 (ref 5–15)
BUN: 85 mg/dL — AB (ref 6–20)
CALCIUM: 8.1 mg/dL — AB (ref 8.9–10.3)
CO2: 19 mmol/L — ABNORMAL LOW (ref 22–32)
Chloride: 104 mmol/L (ref 101–111)
Creatinine, Ser: 3.23 mg/dL — ABNORMAL HIGH (ref 0.44–1.00)
GFR calc Af Amer: 16 mL/min — ABNORMAL LOW (ref >60)
GFR, EST NON AFRICAN AMERICAN: 14 mL/min — AB (ref >60)
GLUCOSE: 179 mg/dL — AB (ref 65–99)
PHOSPHORUS: 5.8 mg/dL — AB (ref 2.5–4.6)
Potassium: 4.4 mmol/L (ref 3.5–5.1)
Sodium: 137 mmol/L (ref 135–145)

## 2015-10-08 LAB — GLUCOSE, CAPILLARY
GLUCOSE-CAPILLARY: 146 mg/dL — AB (ref 65–99)
GLUCOSE-CAPILLARY: 190 mg/dL — AB (ref 65–99)
Glucose-Capillary: 172 mg/dL — ABNORMAL HIGH (ref 65–99)
Glucose-Capillary: 208 mg/dL — ABNORMAL HIGH (ref 65–99)
Glucose-Capillary: 154 mg/dL — ABNORMAL HIGH (ref 65–99)

## 2015-10-08 LAB — CBC
HCT: 26.8 % — ABNORMAL LOW (ref 36.0–46.0)
Hemoglobin: 8.2 g/dL — ABNORMAL LOW (ref 12.0–15.0)
MCH: 26.7 pg (ref 26.0–34.0)
MCHC: 30.6 g/dL (ref 30.0–36.0)
MCV: 87.3 fL (ref 78.0–100.0)
PLATELETS: 261 10*3/uL (ref 150–400)
RBC: 3.07 MIL/uL — AB (ref 3.87–5.11)
RDW: 15.3 % (ref 11.5–15.5)
WBC: 11.1 10*3/uL — ABNORMAL HIGH (ref 4.0–10.5)

## 2015-10-08 LAB — HEPARIN LEVEL (UNFRACTIONATED): Heparin Unfractionated: 0.48 IU/mL (ref 0.30–0.70)

## 2015-10-08 MED ORDER — SODIUM CHLORIDE 0.9 % IV BOLUS (SEPSIS)
250.0000 mL | Freq: Once | INTRAVENOUS | Status: AC
  Administered 2015-10-08: 250 mL via INTRAVENOUS

## 2015-10-08 MED ORDER — DILTIAZEM HCL 60 MG PO TABS
60.0000 mg | ORAL_TABLET | Freq: Three times a day (TID) | ORAL | Status: DC
  Administered 2015-10-08 – 2015-10-09 (×3): 60 mg via ORAL
  Filled 2015-10-08 (×4): qty 1

## 2015-10-08 MED ORDER — ZOLPIDEM TARTRATE 5 MG PO TABS
5.0000 mg | ORAL_TABLET | Freq: Every evening | ORAL | Status: DC | PRN
  Administered 2015-10-08 – 2015-10-14 (×6): 5 mg via ORAL
  Filled 2015-10-08 (×6): qty 1

## 2015-10-08 NOTE — Progress Notes (Signed)
eLink Physician-Brief Progress Note Patient Name: Briana [name] DOB: February 15, 1946 MRN: [id number]   Date of Service  10/08/2015  HPI/Events of Note  Patient would like sleep aide.  eICU Interventions  Ordered ambien 5 mg qhs prn.     Intervention Category Major Interventions: Other:  Ginelle Bays 10/08/2015, 9:36 PM

## 2015-10-08 NOTE — Progress Notes (Addendum)
Briana [name] TEAM 1 - Stepdown/ICU TEAM  Briana [name]  [id number] DOB: 1945/06/18 DOA: 09/30/2015 PCP: Antionette Fairy, PA-C    Brief Narrative:  70 y.o. F Hx Depression with Anxiety, DM2 with neuropathy, CAD, PVD, Chronic Diastolic CHF, Heart murmur, OSA, HTN, HLD, Adenomatous colon polyps, and CKD who presented to the ED at [facility] on the evening of 5/6 with 24 hours of chest pain.  In the ED she was found to have hypoxia, a fever to 101.6, WBC of 15.5, lateral ST depression on EKG, mild troponin elevation (0.12), and BNP 894.0. CXR suggested alveolar opacities, most dense in the Rt base. Cultures were obtained, she was started on CAP coverage, and due to the hypoxia she was transferred to [facility] for tx of pneumonia.  Significant Events: 5/7 Transferred to [facility] MICU for hyxpoxia 5/9 A-Fib w/ RVR  5/9 Chest pain - Hgb dropped  5/10 BIPAP required after PRBC transfusion 5/12 RVR persists   Assessment & Plan:  Acute hypoxic Respiratory Failure w/ B pulmonary infiltrates - PNA + Pulmonary Edema  -completed 5 day course of empiric abx - hypoxia improved but still requiring 4L [place]   Acute on chronic severe (grade 3) diastolic HF -I/O do not appear accurate (no foley) - wgt not recorded today - stopping diuretic today due to [facility] Weights   10/05/15 0329 10/06/15 0357 10/07/15 0233  Weight: 105.3 kg (232 lb 2.3 oz) 106.1 kg (233 lb 14.5 oz) 104.3 kg (229 lb 15 oz)    HTN emergency  Increasing cardizem today - follow   New onset A-Fib w/ RVR / RBBB -rate control improved w/ IV amio but not yet at goal - increase oral cardizem - CHA2DS2 VASc score is 6+ therefore cont IV heparin and watch Hgb - possible DCCV Monday per Cards   Acute normocytic anemia of unclear etiology  -hemoccult negative - no evident acute blood loss - Hgb stable   Recent Labs Lab 10/04/15 0808 10/04/15 1616 10/05/15 0217 10/06/15 0253 10/07/15 0312 10/08/15 0228  HGB 8.3* 9.2* 8.6* 8.6* 8.9* 8.2*     ?PE -LE dopplers neg -doubt PE - her hypoxia is explained by other active issues noted above   CAD s/p CABG 2005 Cards was to plan myoview but I see no orders for same - await their f/u visit   Acute on CKD -baseline Cr ~ 1.6 - 2 - not tolerating diuresis too well - renal US w/o acute findings - stop diuretic today and follow w/ low volume resuscitation    Recent Labs Lab 10/03/15 0239 10/03/15 1030 10/05/15 0217 10/06/15 0253 10/07/15 0312 10/08/15 0228  CREATININE 2.72* 2.76* 2.70* 2.59* 2.86* 3.23*   DM2  -4/21 A1c 6.9 - CBG reasonably controlled  Depression w/ anxiety  -cont zoloft   Obesity - Body mass index is 38.26 kg/(m^2).  DVT prophylaxis: heparin gtt  Code Status: FULL CODE Family Communication: spoke w/ daughter at bedside   Disposition Plan: SDU on amio gtt  Consultants:  P[facility]   Procedures:  5/7 B LE Doppler - Negative DVT/SVT 5/8 TTE EF 55-60% - grade 3 diastolic dysfunction - Left atrium: severely dilated - Tricuspid valve moderate regurgitation - PA peak pressure: 69 mm Hg (S)  Antimicrobials:  Azithromycin 5/6 > 5/09 Ceftriaxone 5/6 > 5/10  Subjective: Denies cp, n/v, sob, or abdom pain.  Appears comfortable.    Objective: Blood pressure 146/74, pulse 103, temperature 97.8 F (36.6 C), temperature source Oral, resp. rate  23, height 5\' 5"  (1.651 m), weight 104.3 kg (229 lb 15 oz), SpO2 97 %.  Intake/Output Summary (Last 24 hours) at 10/08/15 1121 Last data filed at 10/08/15 1031  Gross per 24 hour  Intake 1085.4 ml  Output    550 ml  Net  535.4 ml   Filed Weights   10/05/15 0329 10/06/15 0357 10/07/15 0233  Weight: 105.3 kg (232 lb 2.3 oz) 106.1 kg (233 lb 14.5 oz) 104.3 kg (229 lb 15 oz)    Examination: General: no acute respiratory distress on 4L [place]  Lungs: distant bs th/o - diffuse fine crackles  Cardiovascular: tachycardic at 100bpm - irreg irreg  Abdomen: nontender, overweight, soft, bowel sounds positive,  no rebound, no appreciable mass Extremities: no significant cyanosis, or clubbing w/ trace edema bilateral lower extremities persists   CBC:  Recent Labs Lab 10/03/15 1030  10/04/15 1616 10/05/15 0217 10/06/15 0253 10/07/15 0312 10/08/15 0228  WBC 11.4*  < > 10.3 9.9 10.2 11.1* 11.1*  NEUTROABS 9.4*  --   --   --   --   --   --   HGB 7.9*  < > 9.2* 8.6* 8.6* 8.9* 8.2*  HCT 25.0*  < > 29.0* 27.2* 27.8* 27.6* 26.8*  MCV 87.7  < > 84.3 84.5 84.8 86.3 87.3  PLT 198  < > 223 201 246 299 261  < > = values in this interval not displayed.   Basic Metabolic Panel:  Recent Labs Lab 10/03/15 1030 10/05/15 0217 10/06/15 0253 10/07/15 0312 10/08/15 0228  NA 137 139 137 138 137  K 4.1 4.1 4.2 4.2 4.4  CL 105 105 105 105 104  CO2 18* 21* 19* 20* 19*  GLUCOSE 220* 111* 132* 129* 179*  BUN 56* 66* 77* 81* 85*  CREATININE 2.76* 2.70* 2.59* 2.86* 3.23*  CALCIUM 8.4* 8.2* 8.2* 8.3* 8.1*  MG 2.0  --  2.2  --   --   PHOS  --   --  5.1*  --  5.8*   GFR: Estimated Creatinine Clearance: 19.4 mL/min (by C-G formula based on Cr of 3.23).  Liver Function Tests:  Recent Labs Lab 10/02/15 0223 10/03/15 1030 10/05/15 0217 10/07/15 0312 10/08/15 0228  AST 15 18 31 30   --   ALT 10* 15 23 29   --   ALKPHOS 55 72 101 111  --   BILITOT 0.4 0.6 0.5 0.3  --   PROT 5.6* 6.4* 6.1* 6.1*  --   ALBUMIN 2.5* 2.5* 2.4* 2.4* 2.4*   Coagulation Profile:  Recent Labs Lab 10/03/15 1030  INR 1.23    Cardiac Enzymes:  Recent Labs Lab 10/03/15 1712 10/04/15 0121 10/04/15 1059 10/04/15 1616 10/04/15 2158  TROPONINI 1.41* 1.19* 0.86* 0.73* 0.60*   CBG:  Recent Labs Lab 10/07/15 1311 10/07/15 1634 10/07/15 2113 10/07/15 2338 10/08/15 0824  GLUCAP 218* 206* 155* 146* 172*    Thyroid Function Tests: No results for input(s): TSH, T4TOTAL, FREET4, T3FREE, THYROIDAB in the last 72 hours.  Recent Results (from the past 240 hour(s))  Blood Culture (routine x 2)     Status: None    Collection Time: 09/30/15 10:14 PM  Result Value Ref Range Status   Specimen Description BLOOD LEFT ARM DRAWN BY RN  Final   Special Requests BOTTLES DRAWN AEROBIC AND ANAEROBIC [place]  Final   Culture NO GROWTH 5 DAYS  Final   Report Status 10/05/2015 FINAL  Final  Blood Culture (routine x 2)  Status: None   Collection Time: 09/30/15 10:19 PM  Result Value Ref Range Status   Specimen Description BLOOD RIGHT HAND  Final   Special Requests BOTTLES DRAWN AEROBIC ONLY [place]  Final   Culture NO GROWTH 5 DAYS  Final   Report Status 10/05/2015 FINAL  Final  Urine culture     Status: None   Collection Time: 09/30/15 10:35 PM  Result Value Ref Range Status   Specimen Description URINE, CATHETERIZED  Final   Special Requests NONE  Final   Culture   Final    NO GROWTH 2 DAYS Performed at [facility]    Report Status 10/03/2015 FINAL  Final  MRSA PCR Screening     Status: None   Collection Time: 10/01/15  7:48 AM  Result Value Ref Range Status   MRSA by PCR NEGATIVE NEGATIVE Final    Comment:        The GeneXpert MRSA Assay (FDA approved for NASAL specimens only), is one component of a comprehensive MRSA colonization surveillance program. It is not intended to diagnose MRSA infection nor to guide or monitor treatment for MRSA infections.      Scheduled Meds: . aspirin EC  81 mg Oral Daily  . atorvastatin  80 mg Oral QPM  . diltiazem  30 mg Oral Q8H  . furosemide  40 mg Intravenous BID  . insulin aspart  0-20 Units Subcutaneous TID WC  . insulin aspart  0-5 Units Subcutaneous QHS  . insulin aspart protamine- aspart  15 Units Subcutaneous BID WC  . isosorbide-hydrALAZINE  1 tablet Oral TID  . loratadine  10 mg Oral Daily  . metoprolol tartrate  100 mg Oral BID  . sertraline  100 mg Oral Daily   Continuous Infusions: . sodium chloride 10 mL/hr at 10/07/15 2214  . amiodarone 30 mg/hr (10/08/15 0400)  . heparin 1,900 Units (10/08/15 0400)     LOS: 7 days   Time  spent: 35 minutes   Cherene Altes, MD [facility] Office  [phone] Pager - Text Page per Amion as per below:  On-Call/Text Page:      [email or web address]      password [email or web address]  If 7PM-7AM, please contact night-coverage [email or web address] Password [facility] 10/08/2015, 11:21 AM

## 2015-10-08 NOTE — Progress Notes (Signed)
SUBJECTIVE:    70 y.o. woman with a PMHx most notable for DM2 and cardiovascular disease (CAD, PVD, as well as diastolic HF) who presented to the ED at [facility] on the evening of 5/6 with 24 hours of chest pain.  Found to have hypoxia, a fever to 101.6, WBC of 15.5, EKG changes (lateral ST depression), mild troponin elevation (0.12).   Dx with pneumonia  Developed atrial fib  Had CP associated with a Hb drop.   Was transfused.    OBJECTIVE:   Vitals:   Filed Vitals:   10/08/15 0900 10/08/15 1000 10/08/15 1100 10/08/15 1221  BP: 146/74 123/55 125/63   Pulse: 103 74 104   Temp:    98.6 F (37 C)  TempSrc:    Oral  Resp: 23 22 23    Height:      Weight:      SpO2: 97% 95% 97%    I&O's:    Intake/Output Summary (Last 24 hours) at 10/08/15 1251 Last data filed at 10/08/15 1100  Gross per 24 hour  Intake 1085.4 ml  Output    550 ml  Net  535.4 ml   TELEMETRY: Reviewed telemetry pt in atrial flutter:     PHYSICAL EXAM General: Well developed, well nourished, in no acute distress Head: Eyes PERRLA, No xanthomas.   Normal cephalic and atramatic  Lungs:   Crackles at bases Heart:   Irregularly irregular S1 S2 Pulses are 2+ & equal. Abdomen: Bowel sounds are positive, abdomen soft and non-tender without masses Extremities:   No clubbing, cyanosis or edema.  DP +1 Neuro: Alert and oriented X 3. Psych:  Good affect, responds appropriately   LABS: Basic Metabolic Panel:  Recent Labs  10/06/15 0253 10/07/15 0312 10/08/15 0228  NA 137 138 137  K 4.2 4.2 4.4  CL 105 105 104  CO2 19* 20* 19*  GLUCOSE 132* 129* 179*  BUN 77* 81* 85*  CREATININE 2.59* 2.86* 3.23*  CALCIUM 8.2* 8.3* 8.1*  MG 2.2  --   --   PHOS 5.1*  --  5.8*   Liver Function Tests:  Recent Labs  10/07/15 0312 10/08/15 0228  AST 30  --   ALT 29  --   ALKPHOS 111  --   BILITOT 0.3  --   PROT 6.1*  --   ALBUMIN 2.4* 2.4*   No results for input(s): LIPASE, AMYLASE in the last 72  hours. CBC:  Recent Labs  10/07/15 0312 10/08/15 0228  WBC 11.1* 11.1*  HGB 8.9* 8.2*  HCT 27.6* 26.8*  MCV 86.3 87.3  PLT 299 261   Cardiac Enzymes: No results for input(s): CKTOTAL, CKMB, CKMBINDEX, TROPONINI in the last 72 hours. BNP: Invalid input(s): POCBNP D-Dimer: No results for input(s): DDIMER in the last 72 hours. Hemoglobin A1C: No results for input(s): HGBA1C in the last 72 hours. Fasting Lipid Panel: No results for input(s): CHOL, HDL, LDLCALC, TRIG, CHOLHDL, LDLDIRECT in the last 72 hours. Thyroid Function Tests: No results for input(s): TSH, T4TOTAL, T3FREE, THYROIDAB in the last 72 hours.  Invalid input(s): FREET3 Anemia Panel: No results for input(s): VITAMINB12, FOLATE, FERRITIN, TIBC, IRON, RETICCTPCT in the last 72 hours. Coag Panel:   Lab Results  Component Value Date   INR 1.23 10/03/2015   INR 0.97 10/02/2009    RADIOLOGY: US Carotid Duplex Bilateral  09/29/2015  CLINICAL DATA:  Bilateral carotid artery stenosis. EXAM: BILATERAL CAROTID DUPLEX ULTRASOUND TECHNIQUE: Pearline Cables scale imaging, color Doppler and duplex ultrasound  were performed of bilateral carotid and vertebral arteries in the neck. COMPARISON:  11/24/2014. FINDINGS: Criteria: Quantification of carotid stenosis is based on velocity parameters that correlate the residual internal carotid diameter with NASCET-based stenosis levels, using the diameter of the distal internal carotid lumen as the denominator for stenosis measurement. The following velocity measurements were obtained: RIGHT ICA:  113/16 cm/sec CCA:  123456 cm/sec SYSTOLIC ICA/CCA RATIO:  1.0 DIASTOLIC ICA/CCA RATIO:  1.5 ECA:  128 cm/sec LEFT ICA:  393/82 cm/sec CCA:  [id number] cm/sec SYSTOLIC ICA/CCA RATIO:  4.6 DIASTOLIC ICA/CCA RATIO:  5.8 ECA:  160 cm/sec RIGHT CAROTID ARTERY: Mild to moderate right carotid bifurcation atherosclerotic vascular disease. No flow limiting stenosis. Degree of stenosis less than 50%. RIGHT VERTEBRAL ARTERY:   Patent with antegrade flow. LEFT CAROTID ARTERY: Prominent left carotid bifurcation and proximal ICA atherosclerotic vascular plaque. Degree of stenosis greater than 70%. LEFT VERTEBRAL ARTERY:  Patent antegrade flow. IMPRESSION: 1. Greater than 70 degrees stenosis left carotid bifurcation and proximal ICA. Vascular surgery consultation suggested. 2. Atherosclerotic vascular plaque right carotid bifurcation. Degree of stenosis less than 50%. Vertebral arteries are patent antegrade flow. Electronically Signed   By: Marcello [name]  Register   On: 09/29/2015 13:15   US Renal Port  10/05/2015  CLINICAL DATA:  Chronic renal failure with acute exacerbation EXAM: RENAL ULTRASOUND COMPARISON:  September 05, 2008 FINDINGS: Right Kidney: Length: 11.2 cm. Echogenicity within normal limits. Renal cortical thickness is low normal. No mass, perinephric fluid, or hydronephrosis visualized. No sonographically demonstrable calculus or ureterectasis. Left Kidney: Length: 11.4 cm. Echogenicity within normal limits. Renal cortical thickness is low normal. No mass, perinephric fluid, or hydronephrosis visualized. No sonographically demonstrable calculus or ureterectasis. Bladder: Urinary bladder is decompressed with a Foley catheter and cannot be assessed. IMPRESSION: Low normal renal cortical thickness, a finding that may be seen with medical renal disease. No increase in renal echogenicity or evidence of obstructing focus on either side. Study otherwise unremarkable. Electronically Signed   By: Lowella Grip III M.D.   On: 10/05/2015 17:47   Dg Chest Port 1 View  10/05/2015  CLINICAL DATA:  Pulmonary edema, history of coronary artery disease, CHF, CABG. Acute respiratory failure with hypoxia. EXAM: PORTABLE CHEST 1 VIEW COMPARISON:  Portable chest x-ray of Oct 04, 2015 FINDINGS: The lungs are well-expanded. The interstitial markings remain increased. The left hemidiaphragm is slightly better demonstrated today but increased retrocardiac  density persists. The cardiac silhouette remains enlarged. The pulmonary vascularity is slightly less engorged. There are post CABG changes. IMPRESSION: CHF superimposed upon COPD. Pulmonary interstitial and mild alveolar edema, slightly improved. Left basilar atelectasis or pneumonia and small left pleural effusion. Electronically Signed   By: David  Martinique M.D.   On: 10/05/2015 07:25   Dg Chest Port 1 View  10/04/2015  CLINICAL DATA:  Hypoxia.  Dyspnea EXAM: PORTABLE CHEST 1 VIEW COMPARISON:  10/03/2015 FINDINGS: Airspace opacities in the central and basilar regions have worsened from the earlier study. Probable left pleural effusion. Moderate vascular and interstitial congestion. Moderate cardiomegaly. IMPRESSION: The findings likely represent congestive heart failure. Infectious infiltrates cannot be excluded. Electronically Signed   By: Andreas Newport M.D.   On: 10/04/2015 01:39   Dg Chest Port 1 View  10/03/2015  CLINICAL DATA:  Shortness of breath EXAM: PORTABLE CHEST 1 VIEW COMPARISON:  10/02/2015 FINDINGS: Cardiomegaly again noted. Status post CABG. Patchy airspace disease bilaterally especially lower lobes again noted. Probable small left pleural effusion with left basilar atelectasis or infiltrate. Central mild  vascular congestion without convincing pulmonary edema. IMPRESSION: Patchy airspace disease bilaterally especially lower lobes again noted. Probable small left pleural effusion and left basilar atelectasis or infiltrate. Central mild vascular congestion without convincing pulmonary edema. Electronically Signed   By: Lahoma Crocker M.D.   On: 10/03/2015 08:37   Dg Chest Port 1 View  10/02/2015  CLINICAL DATA:  Acute respiratory failure. EXAM: PORTABLE CHEST 1 VIEW COMPARISON:  10/01/2015 FINDINGS: Airspace opacities persist throughout the central and basilar regions of both lungs, without significant interval change. No pneumothorax. Unchanged cardiomegaly. IMPRESSION: No significant interval  change in the bilateral airspace opacities. Electronically Signed   By: Andreas Newport M.D.   On: 10/02/2015 23:10   Dg Chest Port 1 View  10/01/2015  CLINICAL DATA:  Initial evaluation for acute chest pain. EXAM: PORTABLE CHEST 1 VIEW COMPARISON:  Prior radiograph from 09/30/2015. FINDINGS: Median sternotomy wires with underlying CABG markers again noted. Cardiomegaly stable. Atheromatous plaque within the aortic arch. Lungs mildly hypoinflated with elevation left hemidiaphragm. Interval worsening and patchy right perihilar and basilar opacity, worrisome for progressive pneumonia. Additional patchy opacity at the left lung base may reflect atelectasis or infiltrate. Mild vascular congestion without overt pulmonary edema. No definite pleural effusion, although the right costophrenic angle is incompletely visualized. No pneumothorax. No acute osseus abnormality. IMPRESSION: 1. Interval worsening in patchy right perihilar and basilar opacity, worrisome for progressive pneumonia. 2. Elevation of the left hemidiaphragm with additional patchy left basilar patchy opacity, which may reflect atelectasis or additional infiltrate. 3. Stable cardiomegaly with mild pulmonary vascular congestion without overt pulmonary edema. Electronically Signed   By: Jeannine Boga M.D.   On: 10/01/2015 05:15   Dg Chest Port 1 View  09/30/2015  CLINICAL DATA:  Dyspnea, cough, fever. EXAM: PORTABLE CHEST 1 VIEW COMPARISON:  05/31/2015 FINDINGS: There is unchanged moderate cardiomegaly. There is patchy right base opacity which could represent pneumonia. No large effusion. IMPRESSION: Patchy right lung base opacity, potentially pneumonia although hemorrhage or asymmetric edema could also produce this appearance. Electronically Signed   By: Andreas Newport M.D.   On: 09/30/2015 22:33   ASSESSMENT AND PLAN: Active Problems:  Acute respiratory failure (HCC)  Hypoxia  Elevated troponin  Chest pain  DVT (deep venous  thrombosis) (HCC)  Hypoxemia  Atrial Fibrillation with RVR  1. Elevated troponin: patient presented with SOB and chest pain x 2 days. She states she was having intense sharp pain in the center of her chest with radiation to her neck and jaw. Pain occurred at rest and was not reproducible with palpation. She was found to have ? PNA at [facility] and required Bipap for hypoxia. BNP was elevated at 849 c/w CHF, troponin has been 1.15>>2.13>>1.55>>0.66>>0.77>>1.19>>0.86.  These are in the setting of severe pneumonia and severe renal insufficiency    She does have a history of CAD with CABG in 2005. Echo showed normal LVF with EF 55-60% with grade 3 diastolic dysfunction, mild AR, mild MR and severe pulmonary HTN.   Will try to add her on for a myoview on Tuesday .   Will schedule her for TEE/Cardioversion tomorrow .   2. Acute on chronic diastolic CHF with elevated BNP: patient presented with hypoxia and found to have ? PNA and CHF. Echo shows normal LVF with no RWMAs and grade 3 diastolic dysfunction.    3. HTN: BP improved on higher dose of BB. Hydralazine and metoprolol added and now back on IV Cardizem gtt for afib.    4.  New onset atrial fibrillation with RVR:  TSH is normal. HR persists in the 120's despite Cardizem gtt, BB and now amio gtt.   Because of her CKF, she is not a candidate for a NOAC She will need to be anticoagulated on coumdin. She is currently in heparin .   Will need to try TEE/CArdioversion tomorrow with subsequent transition to coumadin   Will see if we can get a TEE / Cardioversion tomorrow       Briana Moores, MD  10/08/2015  12:51 PM

## 2015-10-08 NOTE — Evaluation (Signed)
Occupational Therapy Evaluation Patient Details Name: NICK FRONHEISER MRN: [id number] DOB: [birth year] Today's Date: 10/08/2015    History of Present Illness Pt adm with acute hypoxic Respiratory Failure in setting of bilateral pulmonary infiltrates. Favor acute Pulmonary Edema over PNA. Pt placed on bipap. PMH - DM, CAD, diastolic HF,    Clinical Impression   This 70 yo female admitted with above presents to acute OT with deficits below (see OT problem list) thus affecting her PLOF of totally independent with basic and IADLs as well as doing a lot of things for her husband. She will benefit from acute OT with follow up OT on CIR to get back to PLOF.    Follow Up Recommendations  CIR    Equipment Recommendations  None recommended by OT       Precautions / Restrictions Precautions Precautions: Fall Precaution Comments: monitor HR an O2 sats Restrictions Weight Bearing Restrictions: No      Mobility Bed Mobility               General bed mobility comments: Pt sitting in chair upon arrival  Transfers Overall transfer level: Needs assistance Equipment used: Rolling walker (2 wheeled) (O2 on 5 liters) Transfers: Sit to/from Stand Sit to Stand: Min guard              Balance Overall balance assessment: Needs assistance Sitting-balance support: Feet supported;No upper extremity supported Sitting balance-Leahy Scale: Good     Standing balance support: Bilateral upper extremity supported;During functional activity Standing balance-Leahy Scale: Poor Standing balance comment: reliant on UE support when up on her feet                            ADL Overall ADL's : Needs assistance/impaired Eating/Feeding: Independent;Sitting   Grooming: Set up;Sitting   Upper Body Bathing: Set up;Sitting   Lower Body Bathing: Set up;Supervison/ safety (min guard A sit<>stand)   Upper Body Dressing : Set up;Sitting   Lower Body Dressing: Supervision/safety;Set  up (min guard A sit<>stand)   Toilet Transfer: Min guard;Ambulation;RW (recliner>6 feet to bathroom door and back to recliner x4)   Toileting- Clothing Manipulation and Hygiene: Min guard;Sit to/from stand                         Pertinent Vitals/Pain Pain Assessment: No/denies pain     Hand Dominance Right   Extremity/Trunk Assessment Upper Extremity Assessment Upper Extremity Assessment: Overall WFL for tasks assessed           Communication Communication Communication: No difficulties   Cognition Arousal/Alertness: Awake/alert Behavior During Therapy: WFL for tasks assessed/performed Overall Cognitive Status: Within Functional Limits for tasks assessed                                Home Living Family/patient expects to be discharged to:: Inpatient rehab Living Arrangements: Spouse/significant other Available Help at Discharge: Family;Available PRN/intermittently Type of Home: House Home Access: Stairs to enter [employer] of Steps: 3 Entrance Stairs-Rails: Left Home Layout: One level     Bathroom Shower/Tub: Walk-in shower;Curtain   Bathroom Toilet: Handicapped height     Home Equipment: Bedside commode;Shower seat;Grab bars - tub/shower;Hand held shower head;Cane - single point;Cane - quad          Prior Functioning/Environment Level of Independence: Independent with assistive device(s)  Comments: Used cane at times for extended distance    OT Diagnosis: Generalized weakness   OT Problem List: Decreased strength;Impaired balance (sitting and/or standing);Cardiopulmonary status limiting activity   OT Treatment/Interventions: Self-care/ADL training;Patient/family education;Balance training;Therapeutic activities;DME and/or AE instruction    OT Goals(Current goals can be found in the care plan section) Acute Rehab OT Goals Patient Stated Goal: home as soon as I can OT Goal Formulation: With patient Time For  Goal Achievement: 10/15/15 Potential to Achieve Goals: Good  OT Frequency: Min 2X/week              End of Session Equipment Utilized During Treatment: Gait belt;Rolling walker;Oxygen (5 liters)  Activity Tolerance: Patient tolerated treatment well Patient left: in chair;with call bell/phone within reach;with family/visitor present   Time: 1354-1430 OT Time Calculation (min): 36 min Charges:  OT General Charges $OT Visit: 1 Procedure OT Evaluation $OT Eval Moderate Complexity: 1 Procedure OT Treatments $Self Care/Home Management : 8-22 mins  Almon Register [id number] 10/08/2015, 3:55 PM

## 2015-10-08 NOTE — Progress Notes (Signed)
ANTICOAGULATION CONSULT NOTE - Follow Up Consult  Pharmacy Consult:  Heparin Indication: chest pain/ACS  Allergies  Allergen Reactions  . Carbamazepine Other (See Comments)    Reaction to tegretol - loopy  . Clonazepam Other (See Comments)    Unknown reaction  . Gemfibrozil Other (See Comments)    Patient is not aware of this allergy but states that she has side effects to a cholesterol medication in the past  . Macrobid [Nitrofurantoin] Nausea And Vomiting  . Oxycodone Other (See Comments)    hallucinations    Patient Measurements: Height: 5\' 5"  (165.1 cm) Weight: 229 lb 15 oz (104.3 kg) IBW/kg (Calculated) : 57 Heparin Dosing Weight: 81 kg  Vital Signs: Temp: 97.8 F (36.6 C) (05/14 0839) Temp Source: Oral (05/14 0839) BP: 146/74 mmHg (05/14 0900) Pulse Rate: 103 (05/14 0900)  Labs:  Recent Labs  10/06/15 0253 10/07/15 0312 10/08/15 0228 10/08/15 0231  HGB 8.6* 8.9* 8.2*  --   HCT 27.8* 27.6* 26.8*  --   PLT 246 299 261  --   HEPARINUNFRC 0.39 0.49  --  0.48  CREATININE 2.59* 2.86* 3.23*  --     Estimated Creatinine Clearance: 19.4 mL/min (by C-G formula based on Cr of 3.23).    Assessment: 56 YOF continue on IV heparin for ACS.  Heparin level sub-therapeutic; no bleeding reported.   Goal of Therapy:  Heparin level 0.3-0.7 units/ml Monitor platelets by anticoagulation protocol: Yes    Plan:  - Continue heparin gtt at 1900 units/hr - Daily HL / CBC - F/U long-term AC plan   Majour Frei D. Mina Marble, PharmD, BCPS Pager:  [phone] 10/08/2015, 10:27 AM

## 2015-10-09 ENCOUNTER — Inpatient Hospital Stay (HOSPITAL_COMMUNITY): Payer: Medicare Other

## 2015-10-09 DIAGNOSIS — R0902 Hypoxemia: Secondary | ICD-10-CM

## 2015-10-09 DIAGNOSIS — I4891 Unspecified atrial fibrillation: Secondary | ICD-10-CM

## 2015-10-09 DIAGNOSIS — I351 Nonrheumatic aortic (valve) insufficiency: Secondary | ICD-10-CM

## 2015-10-09 LAB — RENAL FUNCTION PANEL
Albumin: 2.6 g/dL — ABNORMAL LOW (ref 3.5–5.0)
Anion gap: 14 (ref 5–15)
BUN: 90 mg/dL — AB (ref 6–20)
CO2: 17 mmol/L — ABNORMAL LOW (ref 22–32)
CREATININE: 3.84 mg/dL — AB (ref 0.44–1.00)
Calcium: 8 mg/dL — ABNORMAL LOW (ref 8.9–10.3)
Chloride: 104 mmol/L (ref 101–111)
GFR calc non Af Amer: 11 mL/min — ABNORMAL LOW (ref >60)
GFR, EST AFRICAN AMERICAN: 13 mL/min — AB (ref >60)
GLUCOSE: 140 mg/dL — AB (ref 65–99)
PHOSPHORUS: 6.8 mg/dL — AB (ref 2.5–4.6)
POTASSIUM: 4.3 mmol/L (ref 3.5–5.1)
Sodium: 135 mmol/L (ref 135–145)

## 2015-10-09 LAB — HEPARIN LEVEL (UNFRACTIONATED)
HEPARIN UNFRACTIONATED: 0.75 [IU]/mL — AB (ref 0.30–0.70)
Heparin Unfractionated: 0.76 IU/mL — ABNORMAL HIGH (ref 0.30–0.70)
Heparin Unfractionated: 0.64 IU/mL (ref 0.30–0.70)

## 2015-10-09 LAB — GLUCOSE, CAPILLARY
GLUCOSE-CAPILLARY: 138 mg/dL — AB (ref 65–99)
GLUCOSE-CAPILLARY: 143 mg/dL — AB (ref 65–99)
GLUCOSE-CAPILLARY: 198 mg/dL — AB (ref 65–99)
GLUCOSE-CAPILLARY: 224 mg/dL — AB (ref 65–99)

## 2015-10-09 LAB — CBC
HCT: 26.6 % — ABNORMAL LOW (ref 36.0–46.0)
Hemoglobin: 8.2 g/dL — ABNORMAL LOW (ref 12.0–15.0)
MCH: 26.6 pg (ref 26.0–34.0)
MCHC: 30.8 g/dL (ref 30.0–36.0)
MCV: 86.4 fL (ref 78.0–100.0)
PLATELETS: 286 10*3/uL (ref 150–400)
RBC: 3.08 MIL/uL — ABNORMAL LOW (ref 3.87–5.11)
RDW: 15.4 % (ref 11.5–15.5)
WBC: 11.3 10*3/uL — ABNORMAL HIGH (ref 4.0–10.5)

## 2015-10-09 MED ORDER — SODIUM BICARBONATE 650 MG PO TABS
650.0000 mg | ORAL_TABLET | Freq: Two times a day (BID) | ORAL | Status: DC
  Administered 2015-10-09 – 2015-10-12 (×7): 650 mg via ORAL
  Filled 2015-10-09 (×10): qty 1

## 2015-10-09 MED ORDER — AMIODARONE HCL 200 MG PO TABS: 200.0000 mg | ORAL_TABLET | Freq: Two times a day (BID) | ORAL | Status: DC

## 2015-10-09 MED ORDER — CALCIUM ACETATE (PHOS BINDER) 667 MG PO CAPS
667.0000 mg | ORAL_CAPSULE | Freq: Three times a day (TID) | ORAL | Status: DC
  Administered 2015-10-09 (×2): 667 mg via ORAL
  Filled 2015-10-09 (×4): qty 1

## 2015-10-09 MED ORDER — PROPOFOL 10 MG/ML IV BOLUS
INTRAVENOUS | Status: AC
  Filled 2015-10-09: qty 20

## 2015-10-09 MED ORDER — PROPOFOL 10 MG/ML IV BOLUS
90.0000 mg | Freq: Once | INTRAVENOUS | Status: AC
  Administered 2015-10-09: 90 mg via INTRAVENOUS

## 2015-10-09 MED ORDER — AMIODARONE HCL 200 MG PO TABS
400.0000 mg | ORAL_TABLET | Freq: Two times a day (BID) | ORAL | Status: DC
  Administered 2015-10-09 – 2015-10-10 (×2): 400 mg via ORAL
  Filled 2015-10-09 (×2): qty 2

## 2015-10-09 MED ORDER — SEVELAMER CARBONATE 800 MG PO TABS
800.0000 mg | ORAL_TABLET | Freq: Three times a day (TID) | ORAL | Status: DC
  Filled 2015-10-09: qty 1

## 2015-10-09 MED ORDER — SODIUM CHLORIDE 0.9 % IV SOLN
250.0000 mL | INTRAVENOUS | Status: DC
  Administered 2015-10-09: 250 mL via INTRAVENOUS

## 2015-10-09 MED ORDER — SODIUM CHLORIDE 0.9% FLUSH: 3.0000 mL | INTRAVENOUS | Status: DC | PRN

## 2015-10-09 MED ORDER — SODIUM CHLORIDE 0.9% FLUSH
3.0000 mL | Freq: Two times a day (BID) | INTRAVENOUS | Status: DC
  Administered 2015-10-09: 3 mL via INTRAVENOUS

## 2015-10-09 NOTE — Progress Notes (Addendum)
[place] TEAM 1 - Stepdown/ICU TEAM  Briana [name]  [id number] DOB: 13-Aug-1945 DOA: 09/30/2015 PCP: Antionette Fairy, PA-C    Brief Narrative:  70 y.o. F Hx Depression with Anxiety, DM2 with neuropathy, CAD, PVD, Chronic Diastolic CHF, Heart murmur, OSA, HTN, HLD, Adenomatous colon polyps, and CKD who presented to the ED at [facility] on the evening of 5/6 with 24 hours of chest pain.  In the ED she was found to have hypoxia, a fever to 101.6, WBC of 15.5, lateral ST depression on EKG, mild troponin elevation (0.12), and BNP 894.0. CXR suggested alveolar opacities, most dense in the Rt base. Cultures were obtained, she was started on CAP coverage, and due to the hypoxia she was transferred to [facility] for tx of pneumonia.  Significant Events: 5/7 Transferred to [facility] MICU for hyxpoxia 5/9 A-Fib w/ RVR  5/9 Chest pain - Hgb dropped  5/10 BIPAP required after PRBC transfusion 5/12 RVR persists   Assessment & Plan:  Acute hypoxic Respiratory Failure w/ B pulmonary infiltrates - PNA + Pulmonary Edema  -completed 5 day course of empiric abx - hypoxia improved - attempt to wean O2 down today   Acute on chronic severe (grade 3) diastolic HF -I/O not entirely accurate (no foley) - holding diuretic  Filed Weights   10/06/15 0357 10/07/15 0233 10/09/15 0500  Weight: 106.1 kg (233 lb 14.5 oz) 104.3 kg (229 lb 15 oz) 106.9 kg (235 lb 10.8 oz)    HTN emergency  BP currently well controlled   New onset A-Fib w/ RVR / RBBB -rate control improved w/ increased CCB in addition to IV amio - CHA2DS2 VASc score is 6+ therefore cont IV heparin - possible DCCV today   Acute normocytic anemia of unclear etiology  -hemoccult negative - no evident acute blood loss - Hgb stable - cont to follow   Recent Labs Lab 10/04/15 1616 10/05/15 0217 10/06/15 0253 10/07/15 0312 10/08/15 0228 10/09/15 0335  HGB 9.2* 8.6* 8.6* 8.9* 8.2* 8.2*    ?PE -LE dopplers neg -doubt PE - her hypoxia is explained by  other active issues noted above   CAD s/p CABG 2005 Cards planning myoview for ?Tuesday   Acute on CKD -baseline Cr ~ 1.6 - 2 - not tolerating diuresis too well - renal US w/o acute findings - stopped diuretic 5/14 and administered low volume resuscitation  - crt continues to climb - avoiding all nephrotoxins as able - hopeful that restoration of NSR will improve renal perfusion - if crt continues to climb will ask for Nephrology evaluation   Recent Labs Lab 10/03/15 1030 10/05/15 0217 10/06/15 0253 10/07/15 0312 10/08/15 0228 10/09/15 0335  CREATININE 2.76* 2.70* 2.59* 2.86* 3.23* 3.84*   DM2  -4/21 A1c 6.9 - CBG reasonably controlled  Depression w/ anxiety  -cont zoloft   Obesity - Body mass index is 39.22 kg/(m^2).  DVT prophylaxis: heparin gtt  Code Status: FULL CODE Family Communication: spoke w/ daughter at bedside   Disposition Plan: SDU on amio gtt - for TEE/DCCV today - possible myoview in AM   Consultants:  PCCM [facility]   Procedures:  5/7 B LE Doppler - Negative DVT/SVT 5/8 TTE EF 55-60% - grade 3 diastolic dysfunction - Left atrium: severely dilated - Tricuspid valve moderate regurgitation - PA peak pressure: 69 mm Hg (S)  Antimicrobials:  Azithromycin 5/6 > 5/09 Ceftriaxone 5/6 > 5/10  Subjective: Anxious to get out of the hospital.  Denies cp, sob, n/v, or  abdom pain.  Is hungry.    Objective: Blood pressure 138/72, pulse 90, temperature 97.8 F (36.6 C), temperature source Oral, resp. rate 25, height 5\' 5"  (1.651 m), weight 106.9 kg (235 lb 10.8 oz), SpO2 100 %.  Intake/Output Summary (Last 24 hours) at 10/09/15 0945 Last data filed at 10/09/15 0800  Gross per 24 hour  Intake 1540.2 ml  Output    570 ml  Net  970.2 ml   Filed Weights   10/06/15 0357 10/07/15 0233 10/09/15 0500  Weight: 106.1 kg (233 lb 14.5 oz) 104.3 kg (229 lb 15 oz) 106.9 kg (235 lb 10.8 oz)    Examination: General: no acute respiratory distress   Lungs: distant  bs th/o - diffuse fine crackles stable   Cardiovascular: irreg irreg  Abdomen: nontender, overweight, soft, bowel sounds positive, no rebound, no mass Extremities: no significant cyanosis, or clubbing w/ trace edema bilateral lower extremities   CBC:  Recent Labs Lab 10/03/15 1030  10/05/15 0217 10/06/15 0253 10/07/15 0312 10/08/15 0228 10/09/15 0335  WBC 11.4*  < > 9.9 10.2 11.1* 11.1* 11.3*  NEUTROABS 9.4*  --   --   --   --   --   --   HGB 7.9*  < > 8.6* 8.6* 8.9* 8.2* 8.2*  HCT 25.0*  < > 27.2* 27.8* 27.6* 26.8* 26.6*  MCV 87.7  < > 84.5 84.8 86.3 87.3 86.4  PLT 198  < > 201 246 299 261 286  < > = values in this interval not displayed.   Basic Metabolic Panel:  Recent Labs Lab 10/03/15 1030 10/05/15 0217 10/06/15 0253 10/07/15 0312 10/08/15 0228 10/09/15 0335  NA 137 139 137 138 137 135  K 4.1 4.1 4.2 4.2 4.4 4.3  CL 105 105 105 105 104 104  CO2 18* 21* 19* 20* 19* 17*  GLUCOSE 220* 111* 132* 129* 179* 140*  BUN 56* 66* 77* 81* 85* 90*  CREATININE 2.76* 2.70* 2.59* 2.86* 3.23* 3.84*  CALCIUM 8.4* 8.2* 8.2* 8.3* 8.1* 8.0*  MG 2.0  --  2.2  --   --   --   PHOS  --   --  5.1*  --  5.8* 6.8*   GFR: Estimated Creatinine Clearance: 16.6 mL/min (by C-G formula based on Cr of 3.84).  Liver Function Tests:  Recent Labs Lab 10/03/15 1030 10/05/15 0217 10/07/15 0312 10/08/15 0228 10/09/15 0335  AST 18 31 30   --   --   ALT 15 23 29   --   --   ALKPHOS 72 101 111  --   --   BILITOT 0.6 0.5 0.3  --   --   PROT 6.4* 6.1* 6.1*  --   --   ALBUMIN 2.5* 2.4* 2.4* 2.4* 2.6*   Coagulation Profile:  Recent Labs Lab 10/03/15 1030  INR 1.23    Cardiac Enzymes:  Recent Labs Lab 10/03/15 1712 10/04/15 0121 10/04/15 1059 10/04/15 1616 10/04/15 2158  TROPONINI 1.41* 1.19* 0.86* 0.73* 0.60*   CBG:  Recent Labs Lab 10/08/15 0824 10/08/15 1223 10/08/15 1638 10/08/15 2302 10/09/15 0826  GLUCAP 172* 208* 154* 190* 138*    Thyroid Function Tests: No  results for input(s): TSH, T4TOTAL, FREET4, T3FREE, THYROIDAB in the last 72 hours.  Recent Results (from the past 240 hour(s))  Blood Culture (routine x 2)     Status: None   Collection Time: 09/30/15 10:14 PM  Result Value Ref Range Status   Specimen Description BLOOD LEFT ARM DRAWN  BY RN  Final   Special Requests BOTTLES DRAWN AEROBIC AND ANAEROBIC 6CC  Final   Culture NO GROWTH 5 DAYS  Final   Report Status 10/05/2015 FINAL  Final  Blood Culture (routine x 2)     Status: None   Collection Time: 09/30/15 10:19 PM  Result Value Ref Range Status   Specimen Description BLOOD RIGHT HAND  Final   Special Requests BOTTLES DRAWN AEROBIC ONLY [place]  Final   Culture NO GROWTH 5 DAYS  Final   Report Status 10/05/2015 FINAL  Final  Urine culture     Status: None   Collection Time: 09/30/15 10:35 PM  Result Value Ref Range Status   Specimen Description URINE, CATHETERIZED  Final   Special Requests NONE  Final   Culture   Final    NO GROWTH 2 DAYS Performed at [facility]    Report Status 10/03/2015 FINAL  Final  MRSA PCR Screening     Status: None   Collection Time: 10/01/15  7:48 AM  Result Value Ref Range Status   MRSA by PCR NEGATIVE NEGATIVE Final    Comment:        The GeneXpert MRSA Assay (FDA approved for NASAL specimens only), is one component of a comprehensive MRSA colonization surveillance program. It is not intended to diagnose MRSA infection nor to guide or monitor treatment for MRSA infections.      Scheduled Meds: . aspirin EC  81 mg Oral Daily  . atorvastatin  80 mg Oral QPM  . diltiazem  60 mg Oral Q8H  . insulin aspart  0-20 Units Subcutaneous TID WC  . insulin aspart  0-5 Units Subcutaneous QHS  . insulin aspart protamine- aspart  15 Units Subcutaneous BID WC  . isosorbide-hydrALAZINE  1 tablet Oral TID  . loratadine  10 mg Oral Daily  . metoprolol tartrate  100 mg Oral BID  . sertraline  100 mg Oral Daily   Continuous Infusions: . sodium  chloride 10 mL/hr at 10/07/15 2214  . amiodarone 30 mg/hr (10/09/15 0248)  . heparin 1,750 Units/hr (10/09/15 0715)     LOS: 8 days   Time spent: 35 minutes   Cherene Altes, MD [facility] Office  [phone] Pager - Text Page per Amion as per below:  On-Call/Text Page:      [email or web address]      password [email or web address]  If 7PM-7AM, please contact night-coverage [email or web address] Password [email or web address] 10/09/2015, 9:45 AM

## 2015-10-09 NOTE — Progress Notes (Signed)
ANTICOAGULATION CONSULT NOTE - Follow Up Consult  Pharmacy Consult for Heparin  Indication: chest pain/ACS, afib  Allergies  Allergen Reactions  . Carbamazepine Other (See Comments)    Reaction to tegretol - loopy  . Clonazepam Other (See Comments)    Unknown reaction  . Gemfibrozil Other (See Comments)    Patient is not aware of this allergy but states that she has side effects to a cholesterol medication in the past  . Macrobid [Nitrofurantoin] Nausea And Vomiting  . Oxycodone Other (See Comments)    hallucinations    Patient Measurements: Height: 5\' 5"  (165.1 cm) Weight:  (Pt is in the chair, refused to get back to bed) IBW/kg (Calculated) : 57  Vital Signs: Temp: 98.1 F (36.7 C) (05/15 0339) Temp Source: Oral (05/15 0339) BP: 137/79 mmHg (05/14 2252) Pulse Rate: 95 (05/14 2252)  Labs:  Recent Labs  10/07/15 0312 10/08/15 0228 10/08/15 0231 10/09/15 0335  HGB 8.9* 8.2*  --  8.2*  HCT 27.6* 26.8*  --  26.6*  PLT 299 261  --  286  HEPARINUNFRC 0.49  --  0.48 0.76*  CREATININE 2.86* 3.23*  --  3.84*    Estimated Creatinine Clearance: 16.3 mL/min (by C-G formula based on Cr of 3.84).   Assessment: Heparin level is elevated this AM, possible cardioversion today/cath tomorrow  Goal of Therapy:  Heparin level 0.3-0.7 units/ml Monitor platelets by anticoagulation protocol: Yes   Plan:  -Decrease heparin to 1750 units/hr -1300 HL  Narda Bonds 10/09/2015,4:39 AM

## 2015-10-09 NOTE — CV Procedure (Signed)
Brief TEE Note:   LVEF >55% Mild AR.  Calcified aortic valve.   Mild MR. Moderate TR. No LA/LAA thrombus or mass.    Electrical Cardioversion Procedure Note Briana [name] [id number] Sep 03, 1945  Procedure: Electrical Cardioversion Indications:  Atrial Fibrillation  Procedure Details Consent: Risks of procedure as well as the alternatives and risks of each were explained to the (patient/caregiver).  Consent for procedure obtained. Time Out: Verified patient identification, verified procedure, site/side was marked, verified correct patient position, special equipment/implants available, medications/allergies/relevent history reviewed, required imaging and test results available.  Performed  Patient placed on cardiac monitor, pulse oximetry, supplemental oxygen as necessary.  Sedation given: propofol Pacer pads placed anterior and posterior chest.  Cardioverted 1 time(s).  Cardioverted at 150J.  Evaluation Findings: Post procedure EKG shows: sinus bradycardia Complications: None Patient did tolerate procedure well.  Kijuana Ruppel C. Oval Linsey, MD, [facility]   10/09/2015, 2:02 PM

## 2015-10-09 NOTE — Progress Notes (Signed)
SUBJECTIVE:   Briana [name] is a 70 yo female with PMHx of CAD s/p CABG  In 2005, carotid artery stenosis, chronic diastolic CHF, OSA, HTN, HLD, and CKD who presented to [facility] ED on 5/6 with complaint of 24 hours of chest pain. Patient was found to be in acute respiratory failure with hypoxia likely secondary to CAP. Cardiology was consulted for chest pain on 5/7. EKG showed lateral ST depression with a mild troponin elevation at 0.12. Patient developed atrial fibrillation with RVR on 5/9.   Patient was seen and examined this morning. Patient states last episode of chest pain was 2-3 days ago. She admits to increased shortness of breath and orthopnea above baseline. She denies current chest pain or nausea. She wants to eat.   OBJECTIVE:   Vitals:   Filed Vitals:   10/09/15 0823 10/09/15 0837 10/09/15 0900 10/09/15 1000  BP:  138/72 134/75 121/65  Pulse:  90 109 83  Temp: 97.8 F (36.6 C)     TempSrc: Oral     Resp:   21 16  Height:      Weight:      SpO2:  100% 98% 98%   I&O's:    Intake/Output Summary (Last 24 hours) at 10/09/15 1029 Last data filed at 10/09/15 1000  Gross per 24 hour  Intake 1562.7 ml  Output    570 ml  Net  992.7 ml   TELEMETRY: Reviewed telemetry pt in atrial fibrillation/flutter, HR 60-80s.  ECHO 5/8: Impressions: - The right ventricular systolic pressure was increased consistent  with severe pulmonary hypertension. PA peak pressure 69.  - EF 0000000, grade 3 diastolic dysfunction  PHYSICAL EXAM General: Vital signs reviewed.  Patient is elderly, in no acute distress and cooperative with exam.  Eyes: Conjunctivae normal, no scleral icterus.  Neck: Supple, trachea midline, no carotid bruit present.  Cardiovascular: Irregularly irregular, 3/6 systolic ejection murmur. Pulmonary/Chest: Clear to auscultation bilaterally Abdominal: Soft, non-tender, non-distended, BS + Extremities: 2+ pitting lower extremity edema bilaterally Skin: Warm, dry and  intact.  Psychiatric: Normal mood and affect. speech and behavior is normal. Cognition and memory are grossly normal.   LABS: Basic Metabolic Panel:  Recent Labs  10/08/15 0228 10/09/15 0335  NA 137 135  K 4.4 4.3  CL 104 104  CO2 19* 17*  GLUCOSE 179* 140*  BUN 85* 90*  CREATININE 3.23* 3.84*  CALCIUM 8.1* 8.0*  PHOS 5.8* 6.8*   Liver Function Tests:  Recent Labs  10/07/15 0312 10/08/15 0228 10/09/15 0335  AST 30  --   --   ALT 29  --   --   ALKPHOS 111  --   --   BILITOT 0.3  --   --   PROT 6.1*  --   --   ALBUMIN 2.4* 2.4* 2.6*   CBC:  Recent Labs  10/08/15 0228 10/09/15 0335  WBC 11.1* 11.3*  HGB 8.2* 8.2*  HCT 26.8* 26.6*  MCV 87.3 86.4  PLT 261 286   Coag Panel:   Lab Results  Component Value Date   INR 1.23 10/03/2015   INR 0.97 10/02/2009    RADIOLOGY: US Carotid Duplex Bilateral  09/29/2015  CLINICAL DATA:  Bilateral carotid artery stenosis. EXAM: BILATERAL CAROTID DUPLEX ULTRASOUND TECHNIQUE: Pearline Cables scale imaging, color Doppler and duplex ultrasound were performed of bilateral carotid and vertebral arteries in the neck. COMPARISON:  11/24/2014. FINDINGS: Criteria: Quantification of carotid stenosis is based on velocity parameters that correlate the residual internal carotid  diameter with NASCET-based stenosis levels, using the diameter of the distal internal carotid lumen as the denominator for stenosis measurement. The following velocity measurements were obtained: RIGHT ICA:  113/16 cm/sec CCA:  123456 cm/sec SYSTOLIC ICA/CCA RATIO:  1.0 DIASTOLIC ICA/CCA RATIO:  1.5 ECA:  128 cm/sec LEFT ICA:  393/82 cm/sec CCA:  [id number] cm/sec SYSTOLIC ICA/CCA RATIO:  4.6 DIASTOLIC ICA/CCA RATIO:  5.8 ECA:  160 cm/sec RIGHT CAROTID ARTERY: Mild to moderate right carotid bifurcation atherosclerotic vascular disease. No flow limiting stenosis. Degree of stenosis less than 50%. RIGHT VERTEBRAL ARTERY:  Patent with antegrade flow. LEFT CAROTID ARTERY: Prominent left carotid  bifurcation and proximal ICA atherosclerotic vascular plaque. Degree of stenosis greater than 70%. LEFT VERTEBRAL ARTERY:  Patent antegrade flow. IMPRESSION: 1. Greater than 70 degrees stenosis left carotid bifurcation and proximal ICA. Vascular surgery consultation suggested. 2. Atherosclerotic vascular plaque right carotid bifurcation. Degree of stenosis less than 50%. Vertebral arteries are patent antegrade flow. Electronically Signed   By: Marcello Moores  Register   On: 09/29/2015 13:15   US Renal Port  10/05/2015  CLINICAL DATA:  Chronic renal failure with acute exacerbation EXAM: RENAL ULTRASOUND COMPARISON:  September 05, 2008 FINDINGS: Right Kidney: Length: 11.2 cm. Echogenicity within normal limits. Renal cortical thickness is low normal. No mass, perinephric fluid, or hydronephrosis visualized. No sonographically demonstrable calculus or ureterectasis. Left Kidney: Length: 11.4 cm. Echogenicity within normal limits. Renal cortical thickness is low normal. No mass, perinephric fluid, or hydronephrosis visualized. No sonographically demonstrable calculus or ureterectasis. Bladder: Urinary bladder is decompressed with a Foley catheter and cannot be assessed. IMPRESSION: Low normal renal cortical thickness, a finding that may be seen with medical renal disease. No increase in renal echogenicity or evidence of obstructing focus on either side. Study otherwise unremarkable. Electronically Signed   By: Lowella Grip III M.D.   On: 10/05/2015 17:47   Dg Chest Port 1 View  10/05/2015  CLINICAL DATA:  Pulmonary edema, history of coronary artery disease, CHF, CABG. Acute respiratory failure with hypoxia. EXAM: PORTABLE CHEST 1 VIEW COMPARISON:  Portable chest x-ray of Oct 04, 2015 FINDINGS: The lungs are well-expanded. The interstitial markings remain increased. The left hemidiaphragm is slightly better demonstrated today but increased retrocardiac density persists. The cardiac silhouette remains enlarged. The  pulmonary vascularity is slightly less engorged. There are post CABG changes. IMPRESSION: CHF superimposed upon COPD. Pulmonary interstitial and mild alveolar edema, slightly improved. Left basilar atelectasis or pneumonia and small left pleural effusion. Electronically Signed   By: David  Martinique M.D.   On: 10/05/2015 07:25   Dg Chest Port 1 View  10/04/2015  CLINICAL DATA:  Hypoxia.  Dyspnea EXAM: PORTABLE CHEST 1 VIEW COMPARISON:  10/03/2015 FINDINGS: Airspace opacities in the central and basilar regions have worsened from the earlier study. Probable left pleural effusion. Moderate vascular and interstitial congestion. Moderate cardiomegaly. IMPRESSION: The findings likely represent congestive heart failure. Infectious infiltrates cannot be excluded. Electronically Signed   By: Andreas Newport M.D.   On: 10/04/2015 01:39   Dg Chest Port 1 View  10/03/2015  CLINICAL DATA:  Shortness of breath EXAM: PORTABLE CHEST 1 VIEW COMPARISON:  10/02/2015 FINDINGS: Cardiomegaly again noted. Status post CABG. Patchy airspace disease bilaterally especially lower lobes again noted. Probable small left pleural effusion with left basilar atelectasis or infiltrate. Central mild vascular congestion without convincing pulmonary edema. IMPRESSION: Patchy airspace disease bilaterally especially lower lobes again noted. Probable small left pleural effusion and left basilar atelectasis or infiltrate. Central mild vascular congestion  without convincing pulmonary edema. Electronically Signed   By: Lahoma Crocker M.D.   On: 10/03/2015 08:37   Dg Chest Port 1 View  10/02/2015  CLINICAL DATA:  Acute respiratory failure. EXAM: PORTABLE CHEST 1 VIEW COMPARISON:  10/01/2015 FINDINGS: Airspace opacities persist throughout the central and basilar regions of both lungs, without significant interval change. No pneumothorax. Unchanged cardiomegaly. IMPRESSION: No significant interval change in the bilateral airspace opacities. Electronically  Signed   By: Andreas Newport M.D.   On: 10/02/2015 23:10   Dg Chest Port 1 View  10/01/2015  CLINICAL DATA:  Initial evaluation for acute chest pain. EXAM: PORTABLE CHEST 1 VIEW COMPARISON:  Prior radiograph from 09/30/2015. FINDINGS: Median sternotomy wires with underlying CABG markers again noted. Cardiomegaly stable. Atheromatous plaque within the aortic arch. Lungs mildly hypoinflated with elevation left hemidiaphragm. Interval worsening and patchy right perihilar and basilar opacity, worrisome for progressive pneumonia. Additional patchy opacity at the left lung base may reflect atelectasis or infiltrate. Mild vascular congestion without overt pulmonary edema. No definite pleural effusion, although the right costophrenic angle is incompletely visualized. No pneumothorax. No acute osseus abnormality. IMPRESSION: 1. Interval worsening in patchy right perihilar and basilar opacity, worrisome for progressive pneumonia. 2. Elevation of the left hemidiaphragm with additional patchy left basilar patchy opacity, which may reflect atelectasis or additional infiltrate. 3. Stable cardiomegaly with mild pulmonary vascular congestion without overt pulmonary edema. Electronically Signed   By: Jeannine Boga M.D.   On: 10/01/2015 05:15   Dg Chest Port 1 View  09/30/2015  CLINICAL DATA:  Dyspnea, cough, fever. EXAM: PORTABLE CHEST 1 VIEW COMPARISON:  05/31/2015 FINDINGS: There is unchanged moderate cardiomegaly. There is patchy right base opacity which could represent pneumonia. No large effusion. IMPRESSION: Patchy right lung base opacity, potentially pneumonia although hemorrhage or asymmetric edema could also produce this appearance. Electronically Signed   By: Andreas Newport M.D.   On: 09/30/2015 22:33   ASSESSMENT/PLAN:   Briana [name] is a 69 yo female with PMHx of CAD s/p CABG  In 2005, carotid artery stenosis, chronic diastolic CHF who presented to [facility] ED on 5/6 with complaint of 24 hours of  chest pain and found to be in acute respiratory failure with hypoxia likely secondary to CAP. Cardiology was consulted for chest pain and new onset afib with RVR.  Chest Pain: In the setting of CAD s/p CABG 2005. EKG revealed lateral ST depressions. Troponins have trended 1.15>2.13>1.55>0.66>0.77>1.19>0.86. Given her history of CAD, plans were made for myoview on 5/16. -Stress myoview on 5/16 -NPO at midnight -Nitroglycerin prn  -ASA 81 mg daily -Atorvastatin 80 mg daily  -On Heparin gtt  New Onset Atrial Fibrillation with RVR: TSH normal. HR this morning controlled in the 60-80s, but continues to be in afib/flutter. Patient is NPO for possible TEE/Cardioversion today. -Possible TEE/Cardioversion today based on schedule, may be done at bedside with Cardiology and PCCM, if anesthesiology not available -Currently on heparin, with plans to transition to coumadin after TEE/Cardioversion -HR controlled on amiodarone 30 mg/hr gtt, cardizem 60 mg Q8H, and metoprolol 100 mg BID -Plan to transition to po amiodarone after TEE  Chronic HFpEF:  Echo 0000000, grade 3 diastolic dysfunction. Evidence of lower extremity edema, with worsening kidney function. Lungs are clear. We will continue to hold lasix due to worsening kidney function. Hopefully, volume status and kidney function will improve with rate control.  -On metoprolol 100 mg BID -On Bidil 20-37.5 0.5 tablet TID -Holding home lasix -Monitor fluid status and  renal function  DVT/PE ppx: On heparin gtt  Martyn Malay, DO PGY-2 Internal Medicine Resident Pager # [phone] 10/09/2015 10:29 AM   I have seen, examined and evaluated the patient this AM along with Dr. Quay Burow on AM Rounds.  After reviewing all the available data and chart,  I agree with her findings, examination as well as impression recommendations as discussed on rounds..  Based on weekend rounding service recommendations, plans for TEE guided cardioversion today on  amiodarone. Provided were able to restore normal sinus rhythm, with then follow-up Myoview. She has not had any further chest pain since the one episode documented. She does have a little baseline dyspnea that seems to be worse with being in A. Fib -- Currently on heparin, will need to likely convert to warfarin (based on renal function not a DOAC candidate); if no further procedures are planned, would start pending the results of Myoview tomorrow.   She has a preserved ejection fraction but likely has diastolic dysfunction mediated heart failure symptoms but has a relative stable volume status. She is on standing dose of Lopressor and BiDil.   ADDENDUM   she underwent cardioversion with TEE guidance at roughly 2:00 today. Was initially bradycardic. We have held diltiazem. Would continue to hold diltiazem for now. We'll also switch from IV to oral amiodarone - first dose tomorrow.Glenetta Hew, M.D., M.S. Interventional Cardiologist   Pager # [phone] Phone # [phone] [address] [place],  [address]

## 2015-10-09 NOTE — Progress Notes (Signed)
  Echocardiogram Echocardiogram Transesophageal has been performed.  Jennette Dubin 10/09/2015, 2:12 PM

## 2015-10-09 NOTE — Procedures (Signed)
Briana [name] Dec 19, 1945 [id number]  Pre-procedure diagnosis: Atrial fibrillation Post-procedure diagnosis: Atrial fibrillation  Indication: 70 yo female with hx of CAD, diastolic CHF with A fib.  She is scheduled for TEE and then cardioversion.  Asked to assist with conscious sedation for procedure.  General: alert Neuro: CN intact, normal strength HEENT: pupils reactive, MP 3 Cardiac: irregular Chest: no wheeze Abdomen: soft, non tender Ext: no edema  CBC Latest Ref Rng 10/09/2015 10/08/2015 10/07/2015  WBC 4.0 - 10.5 K/uL 11.3(H) 11.1(H) 11.1(H)  Hemoglobin 12.0 - 15.0 g/dL 8.2(L) 8.2(L) 8.9(L)  Hematocrit 36.0 - 46.0 % 26.6(L) 26.8(L) 27.6(L)  Platelets 150 - 400 K/uL 286 261 299    CMP Latest Ref Rng 10/09/2015 10/08/2015 10/07/2015  Glucose 65 - 99 mg/dL 140(H) 179(H) 129(H)  BUN 6 - 20 mg/dL 90(H) 85(H) 81(H)  Creatinine 0.44 - 1.00 mg/dL 3.84(H) 3.23(H) 2.86(H)  Sodium 135 - 145 mmol/L 135 137 138  Potassium 3.5 - 5.1 mmol/L 4.3 4.4 4.2  Chloride 101 - 111 mmol/L 104 104 105  CO2 22 - 32 mmol/L 17(L) 19(L) 20(L)  Calcium 8.9 - 10.3 mg/dL 8.0(L) 8.1(L) 8.3(L)  Total Protein 6.5 - 8.1 g/dL - - 6.1(L)  Total Bilirubin 0.3 - 1.2 mg/dL - - 0.3  Alkaline Phos 38 - 126 U/L - - 111  AST 15 - 41 U/L - - 30  ALT 14 - 54 U/L - - 29    Procedure  Performed in ICU room.  She was started on 2 liters supplemental oxygen and increased to 4 liters.  She was given total of 90 mg of propofol to assist with TEE and then cardioversion.  Hemodynamics and oxygenation remained acceptable throughout procedure with no immediate complications.  Start time 1:35 pm End time 1:55 pm  Chesley Mires, MD [place] 10/09/2015, 2:00 PM Pager:  [phone] After 3pm call: [phone]

## 2015-10-09 NOTE — Progress Notes (Signed)
[place] for Heparin  Indication: afib  Allergies  Allergen Reactions  . Carbamazepine Other (See Comments)    Reaction to tegretol - loopy  . Clonazepam Other (See Comments)    Unknown reaction  . Gemfibrozil Other (See Comments)    Patient is not aware of this allergy but states that she has side effects to a cholesterol medication in the past  . Macrobid [Nitrofurantoin] Nausea And Vomiting  . Oxycodone Other (See Comments)    hallucinations    Patient Measurements: Height: 5\' 5"  (165.1 cm) Weight: 235 lb 10.8 oz (106.9 kg) IBW/kg (Calculated) : 57  Vital Signs: Temp: 97.6 F (36.4 C) (05/15 2101) Temp Source: Oral (05/15 2101) BP: 142/70 mmHg (05/15 1800) Pulse Rate: 59 (05/15 1800)  Labs:  Recent Labs  10/07/15 0312 10/08/15 0228  10/09/15 0335 10/09/15 1303 10/09/15 2231  HGB 8.9* 8.2*  --  8.2*  --   --   HCT 27.6* 26.8*  --  26.6*  --   --   PLT 299 261  --  286  --   --   HEPARINUNFRC 0.49  --   < > 0.76* 0.75* 0.64  CREATININE 2.86* 3.23*  --  3.84*  --   --   < > = values in this interval not displayed.  Estimated Creatinine Clearance: 16.6 mL/min (by C-G formula based on Cr of 3.84).   Assessment: 70 y.o. female with Afib s/p DCCV for heparin  Goal of Therapy:  Heparin level 0.3-0.7 units/ml Monitor platelets by anticoagulation protocol: Yes   Plan:  .Continue Heparin at current rate Follow-up am labs.   Caryl Pina 10/09/2015,11:18 PM

## 2015-10-09 NOTE — Progress Notes (Signed)
TEE was performed. BP is stable. HR in 50s, Normal sinus. Pt is not in distress, alert, oriented, no pain reported.

## 2015-10-09 NOTE — Progress Notes (Signed)
[place] for Heparin  Indication: chest pain/ACS, afib  Allergies  Allergen Reactions  . Carbamazepine Other (See Comments)    Reaction to tegretol - loopy  . Clonazepam Other (See Comments)    Unknown reaction  . Gemfibrozil Other (See Comments)    Patient is not aware of this allergy but states that she has side effects to a cholesterol medication in the past  . Macrobid [Nitrofurantoin] Nausea And Vomiting  . Oxycodone Other (See Comments)    hallucinations    Patient Measurements: Height: 5\' 5"  (165.1 cm) Weight: 235 lb 10.8 oz (106.9 kg) IBW/kg (Calculated) : 57  Vital Signs: Temp: 97.5 F (36.4 C) (05/15 1222) Temp Source: Oral (05/15 1222) BP: 104/50 mmHg (05/15 1354) Pulse Rate: 46 (05/15 1354)  Labs:  Recent Labs  10/07/15 0312 10/08/15 0228 10/08/15 0231 10/09/15 0335 10/09/15 1303  HGB 8.9* 8.2*  --  8.2*  --   HCT 27.6* 26.8*  --  26.6*  --   PLT 299 261  --  286  --   HEPARINUNFRC 0.49  --  0.48 0.76* 0.75*  CREATININE 2.86* 3.23*  --  3.84*  --     Estimated Creatinine Clearance: 16.6 mL/min (by C-G formula based on Cr of 3.84).   Assessment: Heparin level is elevated, will reduce infusion rate. CBC stable.    Goal of Therapy:  Heparin level 0.3-0.7 units/ml Monitor platelets by anticoagulation protocol: Yes   Plan:  -Decrease heparin to 1600 units/hr -Check level at [address] 10/09/2015,2:12 PM

## 2015-10-09 NOTE — Progress Notes (Signed)
PT Cancellation Note  Patient Details Name: Briana [name] MRN: [id number] DOB: 03/27/1946   Cancelled Treatment:    Reason Eval/Treat Not Completed: Patient at procedure or test/unavailable (TEE/Cardioversion)   [place] 10/09/2015, 1:34 PM [facility] PT [phone]

## 2015-10-10 ENCOUNTER — Ambulatory Visit: Payer: Medicare Other | Admitting: Vascular Surgery

## 2015-10-10 ENCOUNTER — Encounter (HOSPITAL_COMMUNITY): Payer: Medicare Other

## 2015-10-10 ENCOUNTER — Inpatient Hospital Stay (HOSPITAL_COMMUNITY): Payer: Medicare Other

## 2015-10-10 ENCOUNTER — Encounter (HOSPITAL_COMMUNITY)
Admission: EM | Disposition: A | Discharge status: Rehabilitation Facility | Payer: Self-pay | Source: Home / Self Care | Attending: Internal Medicine

## 2015-10-10 DIAGNOSIS — R079 Chest pain, unspecified: Secondary | ICD-10-CM

## 2015-10-10 LAB — RENAL FUNCTION PANEL
ALBUMIN: 2.6 g/dL — AB (ref 3.5–5.0)
Anion gap: 15 (ref 5–15)
BUN: 101 mg/dL — AB (ref 6–20)
CHLORIDE: 102 mmol/L (ref 101–111)
CO2: 18 mmol/L — ABNORMAL LOW (ref 22–32)
CREATININE: 5.32 mg/dL — AB (ref 0.44–1.00)
Calcium: 8.3 mg/dL — ABNORMAL LOW (ref 8.9–10.3)
GFR calc Af Amer: 9 mL/min — ABNORMAL LOW (ref >60)
GFR calc non Af Amer: 7 mL/min — ABNORMAL LOW (ref >60)
GLUCOSE: 163 mg/dL — AB (ref 65–99)
POTASSIUM: 4.7 mmol/L (ref 3.5–5.1)
Phosphorus: 8.4 mg/dL — ABNORMAL HIGH (ref 2.5–4.6)
Sodium: 135 mmol/L (ref 135–145)

## 2015-10-10 LAB — NM MYOCAR MULTI W/SPECT W/WALL MOTION / EF
CHL CUP RESTING HR STRESS: 52 {beats}/min
Peak HR: 60 {beats}/min

## 2015-10-10 LAB — CBC
HEMATOCRIT: 26.2 % — AB (ref 36.0–46.0)
Hemoglobin: 8.1 g/dL — ABNORMAL LOW (ref 12.0–15.0)
MCH: 26.6 pg (ref 26.0–34.0)
MCHC: 30.9 g/dL (ref 30.0–36.0)
MCV: 85.9 fL (ref 78.0–100.0)
PLATELETS: 288 10*3/uL (ref 150–400)
RBC: 3.05 MIL/uL — ABNORMAL LOW (ref 3.87–5.11)
RDW: 15.6 % — AB (ref 11.5–15.5)
WBC: 11.4 10*3/uL — ABNORMAL HIGH (ref 4.0–10.5)

## 2015-10-10 LAB — HEPARIN LEVEL (UNFRACTIONATED): HEPARIN UNFRACTIONATED: 0.45 [IU]/mL (ref 0.30–0.70)

## 2015-10-10 LAB — GLUCOSE, CAPILLARY
GLUCOSE-CAPILLARY: 141 mg/dL — AB (ref 65–99)
Glucose-Capillary: 141 mg/dL — ABNORMAL HIGH (ref 65–99)
Glucose-Capillary: 180 mg/dL — ABNORMAL HIGH (ref 65–99)
Glucose-Capillary: 158 mg/dL — ABNORMAL HIGH (ref 65–99)

## 2015-10-10 SURGERY — ECHOCARDIOGRAM, TRANSESOPHAGEAL
Anesthesia: Monitor Anesthesia Care

## 2015-10-10 MED ORDER — TECHNETIUM TC 99M TETROFOSMIN IV KIT
30.0000 | PACK | Freq: Once | INTRAVENOUS | Status: AC | PRN
  Administered 2015-10-10: 30 via INTRAVENOUS

## 2015-10-10 MED ORDER — SODIUM CHLORIDE 0.9 % IV SOLN
INTRAVENOUS | Status: DC
Start: 2015-10-10 — End: 2015-10-15

## 2015-10-10 MED ORDER — CALCIUM ACETATE (PHOS BINDER) 667 MG PO CAPS
1334.0000 mg | ORAL_CAPSULE | Freq: Three times a day (TID) | ORAL | Status: DC
  Administered 2015-10-10 – 2015-10-18 (×24): 1334 mg via ORAL
  Filled 2015-10-10 (×26): qty 2

## 2015-10-10 MED ORDER — REGADENOSON 0.4 MG/5ML IV SOLN
INTRAVENOUS | Status: AC
  Administered 2015-10-10: 0.4 mg
  Filled 2015-10-10: qty 5

## 2015-10-10 MED ORDER — AMIODARONE HCL 200 MG PO TABS
200.0000 mg | ORAL_TABLET | Freq: Two times a day (BID) | ORAL | Status: DC
  Administered 2015-10-10 – 2015-10-12 (×5): 200 mg via ORAL
  Filled 2015-10-10 (×6): qty 1

## 2015-10-10 MED ORDER — SODIUM CHLORIDE 0.9% FLUSH: 3.0000 mL | INTRAVENOUS | Status: DC | PRN

## 2015-10-10 MED ORDER — SODIUM CHLORIDE 0.9 % IV SOLN: 250.0000 mL | INTRAVENOUS | Status: DC

## 2015-10-10 MED ORDER — TECHNETIUM TC 99M TETROFOSMIN IV KIT
10.0000 | PACK | Freq: Once | INTRAVENOUS | Status: AC | PRN
  Administered 2015-10-10: 10 via INTRAVENOUS

## 2015-10-10 MED ORDER — SODIUM CHLORIDE 0.9 % IV SOLN: INTRAVENOUS | Status: DC

## 2015-10-10 MED ORDER — SODIUM CHLORIDE 0.9% FLUSH
3.0000 mL | Freq: Two times a day (BID) | INTRAVENOUS | Status: DC
  Administered 2015-10-10: 3 mL via INTRAVENOUS
  Administered 2015-10-12: 10:00:00 via INTRAVENOUS

## 2015-10-10 NOTE — Progress Notes (Signed)
OT Cancellation Note  Patient Details Name: Briana [name] MRN: [id number] DOB: [birth year]   Cancelled Treatment:    Reason Eval/Treat Not Completed: Fatigue/lethargy limiting ability to participate  [place], [place], OTR/L [id number]   10/10/2015, 3:28 PM

## 2015-10-10 NOTE — Progress Notes (Signed)
     The patient was seen in nuclear medicine for a lexiscan myoview. She tolerated the procedure well. No acute ST or TW changes on ECG. Await nuclear images.    Kathryn Stern PA-C  [facility]     

## 2015-10-10 NOTE — Progress Notes (Signed)
Briana [name] TEAM 1 - Stepdown/ICU TEAM  [name]  [id number] DOB: 1945/09/04 DOA: 09/30/2015 PCP: Antionette Fairy, PA-C    Brief Narrative:  70 y.o. F Hx Depression with Anxiety, DM2 with neuropathy, CAD, PVD, Chronic Diastolic CHF, Heart murmur, OSA, HTN, HLD, Adenomatous colon polyps, and CKD who presented to the ED at [facility] on the evening of 5/6 with 24 hours of chest pain.  In the ED she was found to have hypoxia, a fever to 101.6, WBC of 15.5, lateral ST depression on EKG, mild troponin elevation (0.12), and BNP 894.0. CXR suggested alveolar opacities, most dense in the Rt base. Cultures were obtained, she was started on CAP coverage, and due to the hypoxia she was transferred to [facility] for tx of pneumonia.  Significant Events: 5/7 Transferred to [facility] MICU for hyxpoxia 5/9 A-Fib w/ RVR  5/9 Chest pain - Hgb dropped  5/10 BIPAP required after PRBC transfusion 5/12 RVR persists  5/15 TEE w/ DCCV > sinus bradycardia   Assessment & Plan:  Acute on CKD -baseline Cr ~ 1.6 - 2 - renal US w/o acute findings - stopped diuretic 5/14 and have administered low volume resuscitation intermittently - crt continues to climb - avoiding all nephrotoxins as able - hopeful that restoration of NSR will improve renal perfusion - Nephrology consulted - will place foley to assure no urinary retention, but pt denies sx to suggest this (and no hydro on [place])  Recent Labs Lab 10/05/15 0217 10/06/15 0253 10/07/15 0312 10/08/15 0228 10/09/15 0335 10/10/15 [id number]  CREATININE 2.70* 2.59* 2.86* 3.23* 3.84* 5.32*    Acute hypoxic Respiratory Failure w/ B pulmonary infiltrates - PNA + Pulmonary Edema  -completed 5 day course of empiric abx - hypoxia improved - wean O2 as able (not on home O2)   Acute on chronic severe (grade 3) diastolic HF -I/O not entirely accurate (no foley) - holding diuretic due to ARF - exam w/ doughy B LE edema  Filed Weights   10/06/15 0357 10/07/15 0233 10/09/15 0500    Weight: 106.1 kg (233 lb 14.5 oz) 104.3 kg (229 lb 15 oz) 106.9 kg (235 lb 10.8 oz)    HTN emergency  BP currently well controlled   New onset A-Fib w/ RVR / RBBB -s/p DCCV 5/15 - maintaining sinus brady presently - CHA2DS2 VASc score is 6+ therefore cont IV heparin - Cards following   CAD s/p CABG 2005 Cards planning nuc med stress test today   Acute normocytic anemia of unclear etiology  -hemoccult negative - no evident acute blood loss - Hgb stable - follow   Recent Labs Lab 10/05/15 0217 10/06/15 0253 10/07/15 0312 10/08/15 0228 10/09/15 0335 10/10/15 0212  HGB 8.6* 8.6* 8.9* 8.2* 8.2* 8.1*    ?PE -LE dopplers neg -doubt PE - her hypoxia is explained by other active issues noted above   DM2  -4/21 A1c 6.9 - CBG reasonably controlled  Depression w/ anxiety  -cont zoloft   Obesity - Body mass index is 39.22 kg/(m^2).  DVT prophylaxis: heparin gtt  Code Status: FULL CODE Family Communication: spoke w/ daughter at bedside   Disposition Plan: transfer to tele bed - nuc med stress test today - follow renal fxn - ambulate   Consultants:  PCCM [facility]   Procedures:  5/7 B LE Doppler - Negative DVT/SVT 5/8 TTE EF 55-60% - grade 3 diastolic dysfunction - Left atrium: severely dilated - Tricuspid valve moderate regurgitation - PA peak pressure: 69 mm  Hg (S) 5/16 TEE w/ DCCV  Antimicrobials:  Azithromycin 5/6 > 5/09 Ceftriaxone 5/6 > 5/10  Subjective: C/o feeling fullness in her abdom, but denies feeling that she needs to urinate.  No cp, sob, n/v.  Some mild dyspepsia.   Objective: Blood pressure 133/45, pulse 58, temperature 97.8 F (36.6 C), temperature source Oral, resp. rate 29, height 5\' 5"  (1.651 m), weight 106.9 kg (235 lb 10.8 oz), SpO2 95 %.  Intake/Output Summary (Last 24 hours) at 10/10/15 0847 Last data filed at 10/10/15 0600  Gross per 24 hour  Intake 482.87 ml  Output    175 ml  Net 307.87 ml   Filed Weights   10/06/15 0357  10/07/15 0233 10/09/15 0500  Weight: 106.1 kg (233 lb 14.5 oz) 104.3 kg (229 lb 15 oz) 106.9 kg (235 lb 10.8 oz)    Examination: General: no acute respiratory distress - alert  Lungs: distant bs th/o - bibasilar crackles - no wheeze  Cardiovascular: bradycardic - regular - no rub or M Abdomen: nontender, overweight, soft, bowel sounds positive, no rebound, mildly tender to deep palpation in suprapubic region but I am not able to feel enlarged bladder Extremities: no significant cyanosis, or clubbing - doughy 2+ edema bilateral lower extremities   CBC:  Recent Labs Lab 10/03/15 1030  10/06/15 0253 10/07/15 0312 10/08/15 0228 10/09/15 0335 10/10/15 0212  WBC 11.4*  < > 10.2 11.1* 11.1* 11.3* 11.4*  NEUTROABS 9.4*  --   --   --   --   --   --   HGB 7.9*  < > 8.6* 8.9* 8.2* 8.2* 8.1*  HCT 25.0*  < > 27.8* 27.6* 26.8* 26.6* 26.2*  MCV 87.7  < > 84.8 86.3 87.3 86.4 85.9  PLT 198  < > 246 299 261 286 288  < > = values in this interval not displayed.   Basic Metabolic Panel:  Recent Labs Lab 10/03/15 1030  10/06/15 0253 10/07/15 0312 10/08/15 0228 10/09/15 0335 10/10/15 0212  NA 137  < > 137 138 137 135 135  K 4.1  < > 4.2 4.2 4.4 4.3 4.7  CL 105  < > 105 105 104 104 102  CO2 18*  < > 19* 20* 19* 17* 18*  GLUCOSE 220*  < > 132* 129* 179* 140* 163*  BUN 56*  < > 77* 81* 85* 90* 101*  CREATININE 2.76*  < > 2.59* 2.86* 3.23* 3.84* 5.32*  CALCIUM 8.4*  < > 8.2* 8.3* 8.1* 8.0* 8.3*  MG 2.0  --  2.2  --   --   --   --   PHOS  --   --  5.1*  --  5.8* 6.8* 8.4*  < > = values in this interval not displayed. GFR: Estimated Creatinine Clearance: 12 mL/min (by C-G formula based on Cr of 5.32).  Liver Function Tests:  Recent Labs Lab 10/03/15 1030 10/05/15 0217 10/07/15 0312 10/08/15 0228 10/09/15 0335 10/10/15 0212  AST 18 31 30   --   --   --   ALT 15 23 29   --   --   --   ALKPHOS 72 101 111  --   --   --   BILITOT 0.6 0.5 0.3  --   --   --   PROT 6.4* 6.1* 6.1*  --    --   --   ALBUMIN 2.5* 2.4* 2.4* 2.4* 2.6* 2.6*   Coagulation Profile:  Recent Labs Lab 10/03/15 1030  INR  1.23    Cardiac Enzymes:  Recent Labs Lab 10/03/15 1712 10/04/15 0121 10/04/15 1059 10/04/15 1616 10/04/15 2158  TROPONINI 1.41* 1.19* 0.86* 0.73* 0.60*   CBG:  Recent Labs Lab 10/08/15 2302 10/09/15 0826 10/09/15 1220 10/09/15 1657 10/09/15 2058  GLUCAP 190* 138* 143* 198* 224*    Recent Results (from the past 240 hour(s))  Blood Culture (routine x 2)     Status: None   Collection Time: 09/30/15 10:14 PM  Result Value Ref Range Status   Specimen Description BLOOD LEFT ARM DRAWN BY RN  Final   Special Requests BOTTLES DRAWN AEROBIC AND ANAEROBIC 6CC  Final   Culture NO GROWTH 5 DAYS  Final   Report Status 10/05/2015 FINAL  Final  Blood Culture (routine x 2)     Status: None   Collection Time: 09/30/15 10:19 PM  Result Value Ref Range Status   Specimen Description BLOOD RIGHT HAND  Final   Special Requests BOTTLES DRAWN AEROBIC ONLY [place]  Final   Culture NO GROWTH 5 DAYS  Final   Report Status 10/05/2015 FINAL  Final  Urine culture     Status: None   Collection Time: 09/30/15 10:35 PM  Result Value Ref Range Status   Specimen Description URINE, CATHETERIZED  Final   Special Requests NONE  Final   Culture   Final    NO GROWTH 2 DAYS Performed at [facility]    Report Status 10/03/2015 FINAL  Final  MRSA PCR Screening     Status: None   Collection Time: 10/01/15  7:48 AM  Result Value Ref Range Status   MRSA by PCR NEGATIVE NEGATIVE Final    Comment:        The GeneXpert MRSA Assay (FDA approved for NASAL specimens only), is one component of a comprehensive MRSA colonization surveillance program. It is not intended to diagnose MRSA infection nor to guide or monitor treatment for MRSA infections.      Scheduled Meds: . amiodarone  400 mg Oral BID   Followed by  . [START ON 10/14/2015] amiodarone  200 mg Oral BID  . aspirin EC  81  mg Oral Daily  . atorvastatin  80 mg Oral QPM  . calcium acetate  667 mg Oral TID WC  . insulin aspart  0-20 Units Subcutaneous TID WC  . insulin aspart  0-5 Units Subcutaneous QHS  . insulin aspart protamine- aspart  15 Units Subcutaneous BID WC  . isosorbide-hydrALAZINE  1 tablet Oral TID  . loratadine  10 mg Oral Daily  . metoprolol tartrate  100 mg Oral BID  . sertraline  100 mg Oral Daily  . sodium bicarbonate  650 mg Oral BID  . sodium chloride flush  3 mL Intravenous Q12H   Continuous Infusions: . sodium chloride 10 mL/hr at 10/07/15 2214  . sodium chloride Stopped (10/09/15 1500)  . heparin 1,600 Units/hr (10/09/15 2319)     LOS: 9 days   Time spent: 35 minutes   Cherene Altes, MD [facility] Office  [phone] Pager - Text Page per Amion as per below:  On-Call/Text Page:      [email or web address]      password [email or web address]  If 7PM-7AM, please contact night-coverage [email or web address] Password [facility] 10/10/2015, 8:47 AM

## 2015-10-10 NOTE — Progress Notes (Signed)
[place] for Heparin  Indication: afib  Allergies  Allergen Reactions  . Carbamazepine Other (See Comments)    Reaction to tegretol - loopy  . Clonazepam Other (See Comments)    Unknown reaction  . Gemfibrozil Other (See Comments)    Patient is not aware of this allergy but states that she has side effects to a cholesterol medication in the past  . Macrobid [Nitrofurantoin] Nausea And Vomiting  . Oxycodone Other (See Comments)    hallucinations    Patient Measurements: Height: 5\' 5"  (165.1 cm) Weight: 235 lb 10.8 oz (106.9 kg) IBW/kg (Calculated) : 57  Vital Signs: Temp: 98.5 F (36.9 C) (05/16 0937) Temp Source: Oral (05/16 0937) BP: 133/45 mmHg (05/16 0600) Pulse Rate: 58 (05/16 0600)  Labs:  Recent Labs  10/08/15 0228  10/09/15 0335 10/09/15 1303 10/09/15 2231 10/10/15 0212  HGB 8.2*  --  8.2*  --   --  8.1*  HCT 26.8*  --  26.6*  --   --  26.2*  PLT 261  --  286  --   --  288  HEPARINUNFRC  --   < > 0.76* 0.75* 0.64 0.45  CREATININE 3.23*  --  3.84*  --   --  5.32*  < > = values in this interval not displayed.  Estimated Creatinine Clearance: 12 mL/min (by C-G formula based on Cr of 5.32).   Assessment: Heparin gtt for AFib. Level remains therapeutic. hgb 8.1, plts wnl. Pt had a TEE with successful cardioversion yesterday.  Goal of Therapy:  Heparin level 0.3-0.7 units/ml Monitor platelets by anticoagulation protocol: Yes   Plan:  -Continue heparin at 1600 units/hr -Daily HL, CBC -F/u plan for PO anticoagulation    Daryan Cagley, [place] 10/10/2015,9:45 AM

## 2015-10-10 NOTE — Progress Notes (Signed)
PT Cancellation Note  Patient Details Name: ELIZIBETH FRUTOS MRN: [id number] DOB: 07-17-1945   Cancelled Treatment:    Reason Eval/Treat Not Completed: Fatigue/lethargy limiting ability to participate; patient just back from stress test and too fatigued to participate in PT this pm.  Will attempt to see tomorrow.   Reginia Naas 10/10/2015, 2:12 PM  [place], [place] 10/10/2015

## 2015-10-10 NOTE — Consult Note (Signed)
EDIT Briana [name] Admit Date: 09/30/2015 10/10/2015 Rexene Agent Requesting Physician:  Thereasa Solo MD  Reason for Consult:  AoCKD HPI:  19F seen at the request of Dr. Thereasa Solo for the evaluation of acute on chronic renal failure. Patient originally presented on 5/6 to [facility] with chest pain, fever, leukocytosis, hypoxia with findings suggestive of pneumonia and was treated for CAP with azithromycin and ceftriaxone, both completed. She also was felt to have an acute on chronic diastolic heart failure exacerbation when was on IV furosemide through 5/10.  Pertinent past history includes type 2 diabetes, obesity with BMI of 38, known atherosclerotic disease with history of coronary disease and CABG as well as PAD, grade 3 diastolic heart failure, hypertension, obstructive sleep apnea.  She has a baseline serum creatinine of 1.6-2.3.  Since admission she had difficulty withatrial fibrillation with RVR failing rate control and for which she underwent DC CV on 5/15 with successful conversion to sinus bradycardia. He also has had anemia requiring transfusion of blood products.  No exposure to IV contrast, nonsteroidals, obvious hypotension. Renal ultrasound on 5/11 with normal-sized kidneys with cortical thinning but no acute or structural issues. Labs today with creatinine up to 5.3, BUN of 101, potassium of 4.7, bicarbonate of 18. Urine output is oliguric currently.  She is having a nuclear stress test currently.   CREAT (mg/dL)  Date Value  09/15/2015 2.37*  05/31/2015 1.53*   CREATININE, SER (mg/dL)  Date Value  10/10/2015 5.32*  10/09/2015 3.84*  10/08/2015 3.23*  10/07/2015 2.86*  10/06/2015 2.59*  10/05/2015 2.70*  10/03/2015 2.76*  10/03/2015 2.72*  10/02/2015 3.01*  10/01/2015 2.18*  ] I/Os: I/O last 3 completed shifts: In: 1027.8 [I.V.:1027.8] Out: 245 [Urine:245]  ROS Balance of 12 systems is negative w/ exceptions as above  PMH  Past Medical History   Diagnosis Date  . Diabetes mellitus   . Hypertension   . Hyperlipidemia   . Heart murmur   . Peripheral vascular disease ([place])   . Arthritis   . Depression with anxiety   . Carotid artery occlusion   . Chronic kidney disease   . Neuropathy ([place])   . CAD (coronary artery disease)   . Rheumatic fever   . Sleep apnea   . PAD (peripheral artery disease) ([place])   . Adenomatous colon polyp 09/2007  . Atrial fibrillation with rapid ventricular response ([place]) 10/05/2015   PSH  Past Surgical History  Procedure Laterality Date  . Coronary artery bypass graft  2005  . Carotid endarterectomy  10/04/2009    Right CEA  . Tubal ligation    . Polypectomy  02/25/2012    Procedure: POLYPECTOMY;  Surgeon: Danie Binder, MD;  Location: [facility];  Service: Endoscopy;  Laterality: N/A;   FH  Family History  Problem Relation Age of Onset  . Breast cancer Mother   . Heart disease Father   . Diabetes Father   . Heart disease Brother    SH  reports that she has never smoked. She has never used smokeless tobacco. She reports that she does not drink alcohol or use illicit drugs. Allergies  Allergies  Allergen Reactions  . Carbamazepine Other (See Comments)    Reaction to tegretol - loopy  . Clonazepam Other (See Comments)    Unknown reaction  . Gemfibrozil Other (See Comments)    Patient is not aware of this allergy but states that she has side effects to a cholesterol medication in the past  . Macrobid [Nitrofurantoin]  Nausea And Vomiting  . Oxycodone Other (See Comments)    hallucinations   Home medications Prior to Admission medications   Medication Sig Start Date End Date Taking? Authorizing Provider  aspirin EC 325 MG tablet Take 325 mg by mouth daily.   Yes Historical Provider, MD  atorvastatin (LIPITOR) 80 MG tablet Take 80 mg by mouth daily.  01/10/14  Yes Historical Provider, MD  cetirizine (ZYRTEC) 10 MG tablet Take 10 mg by mouth at bedtime.   Yes Historical Provider, MD   cholecalciferol (VITAMIN D) 1000 UNITS tablet Take 1,000 Units by mouth daily.    Yes Historical Provider, MD  diazepam (VALIUM) 5 MG tablet Take 5 mg by mouth See admin instructions. Take 1 tablet (5 mg) by mouth daily at bedtime, may also take one tablet during the day as needed for anxiety   Yes Historical Provider, MD  furosemide (LASIX) 40 MG tablet Take 40 mg by mouth daily.    Yes Historical Provider, MD  hydrALAZINE (APRESOLINE) 100 MG tablet Take 1 tablet (100 mg total) by mouth 3 (three) times daily. 06/27/15  Yes Herminio Commons, MD  HYDROcodone-acetaminophen (NORCO) 7.5-325 MG tablet Take 1 tablet by mouth every 4 (four) hours as needed for moderate pain (Must last 30 days.  Do not drive or operate machinery while taking this medicine.). 08/17/15  Yes Sanjuana Kava, MD  insulin NPH-regular Human (NOVOLIN 70/30) (70-30) 100 UNIT/ML injection Inject 30 Units into the skin 2 (two) times daily with a meal. 06/29/15  Yes Cassandria Anger, MD  JANUVIA 25 MG tablet Take 1 tablet (25 mg total) by mouth daily. 06/29/15  Yes Cassandria Anger, MD  metoprolol tartrate (LOPRESSOR) 25 MG tablet Take 1 tablet (25 mg total) by mouth 2 (two) times daily. 05/10/15  Yes Herminio Commons, MD  potassium chloride SA (K-DUR,KLOR-CON) 20 MEQ tablet Take 20 mEq by mouth daily.    Yes Historical Provider, MD  sertraline (ZOLOFT) 100 MG tablet Take 200 mg by mouth at bedtime.  02/23/14  Yes Historical Provider, MD  tiZANidine (ZANAFLEX) 4 MG capsule Take 4 mg by mouth 3 (three) times daily as needed for muscle spasms.    Yes Historical Provider, MD    Current Medications Scheduled Meds: . amiodarone  200 mg Oral BID  . aspirin EC  81 mg Oral Daily  . atorvastatin  80 mg Oral QPM  . calcium acetate  1,334 mg Oral TID WC  . insulin aspart  0-20 Units Subcutaneous TID WC  . insulin aspart  0-5 Units Subcutaneous QHS  . insulin aspart protamine- aspart  15 Units Subcutaneous BID WC  .  isosorbide-hydrALAZINE  1 tablet Oral TID  . loratadine  10 mg Oral Daily  . metoprolol tartrate  100 mg Oral BID  . sertraline  100 mg Oral Daily  . sodium bicarbonate  650 mg Oral BID   Continuous Infusions: . sodium chloride 10 mL/hr at 10/07/15 2214  . heparin 1,600 Units/hr (10/10/15 0800)   PRN Meds:.HYDROcodone-acetaminophen, nitroGLYCERIN, zolpidem  CBC  Recent Labs Lab 10/08/15 0228 10/09/15 0335 10/10/15 0212  WBC 11.1* 11.3* 11.4*  HGB 8.2* 8.2* 8.1*  HCT 26.8* 26.6* 26.2*  MCV 87.3 86.4 85.9  PLT 261 286 [id number]   Basic Metabolic Panel  Recent Labs Lab 10/05/15 0217 10/06/15 0253 10/07/15 0312 10/08/15 0228 10/09/15 0335 10/10/15 0212  NA 139 137 138 137 135 135  K 4.1 4.2 4.2 4.4 4.3 4.7  CL 105 105 105  104 104 102  CO2 21* 19* 20* 19* 17* 18*  GLUCOSE 111* 132* 129* 179* 140* 163*  BUN 66* 77* 81* 85* 90* 101*  CREATININE 2.70* 2.59* 2.86* 3.23* 3.84* 5.32*  CALCIUM 8.2* 8.2* 8.3* 8.1* 8.0* 8.3*  PHOS  --  5.1*  --  5.8* 6.8* 8.4*    Physical Exam  Blood pressure 132/68, pulse 56, temperature 98.5 F (36.9 C), temperature source Oral, resp. rate 18, height 5\' 5"  (1.651 m), weight 106.9 kg (235 lb 10.8 oz), SpO2 98 %. GEN: NAD ENT: NCAT EYES: EOMI CV: RRR PULM: Diminished in bases ABD: s/nt/nd SKIN: no rashes/lesions EXT:3+ LEE  RENAL STUDIES 1. Renal US 5/11: negative for obstruction 2. Urine Sediment: numerous bacteria.  Scattered brown pigmented granular casts.  Occ monomorphic RBC no dysmorphic features. No cellular casts  Assessment 67F with AoCKD3 (BL 1.6-2.3) in setiting of CAP, A/C dCHF exacerbation, AFib with RVR eventually requiring DCCV  1. AoCKD3 2/2 ATN 1. No obstruction or obvious nephrotoxins 2. Diuretics have been held for several days 3. Based on urine sediment with muddy brown casts, looksl ike has had ATN 2. dCHF Exacerbation 3. CAP s/p Azithro and Ceftriaxone 4. AFib s/p DCCV 5/15,  successful 5. HTN 6. Obesity 7. DM2 8. CAD s/p CABG, r/o ACS with Nuc ST: cardiology following  Plan 1. COnt supportive care Daily weights, Daily Renal Panel, Strict I/Os, Avoid nephrotoxins (NSAIDs, judicious IV Contrast)  Monitor daily for RRT needs   Pearson Grippe MD [phone] pgr 10/10/2015, 12:30 PM

## 2015-10-10 NOTE — Progress Notes (Signed)
SUBJECTIVE:   Briana [name] is a 70 yo female with PMHx of CAD s/p CABG  In 2005, carotid artery stenosis, chronic diastolic CHF, OSA, HTN, HLD, and CKD who presented to [facility] ED on 5/6 with complaint of 24 hours of chest pain. Patient was found to be in acute respiratory failure with hypoxia likely secondary to CAP. Cardiology was consulted for chest pain on 5/7. EKG showed lateral ST depression with a mild troponin elevation at 0.12. Patient developed atrial fibrillation with RVR on 5/9.   Yesterday, patient underwent TEE with Cardioversion into sinus bradycardia.   Patient was seen and examined this morning. She denies recurrent chest pain or worsening shortness of breath. She does complain of left hand edema.   OBJECTIVE:   Vitals:   Filed Vitals:   10/10/15 0408 10/10/15 0500 10/10/15 0600 10/10/15 0937  BP:  130/48 133/45   Pulse:  57 58   Temp: 97.8 F (36.6 C)   98.5 F (36.9 C)  TempSrc: Oral   Oral  Resp:  27 29   Height:      Weight:      SpO2:  92% 95%    I&O's:    Intake/Output Summary (Last 24 hours) at 10/10/15 1022 Last data filed at 10/10/15 0600  Gross per 24 hour  Intake 414.47 ml  Output    175 ml  Net 239.47 ml   TELEMETRY: Reviewed telemetry pt in sinus bradycardia, HR 50s.  EKG 10/10/15: Sinus Bradycardia, HR 54. ST depressions, TWI in leads V1, V2, V3, aVL, similar to prior.  ECHO 5/8: Impressions: - The right ventricular systolic pressure was increased consistent  with severe pulmonary hypertension. PA peak pressure 69.  - EF 0000000, grade 3 diastolic dysfunction  PHYSICAL EXAM General: Vital signs reviewed.  Patient is elderly, in no acute distress and cooperative with exam.  Cardiovascular: Bradycardic, regular rhythm, 3/6 systolic ejection murmur. Pulmonary/Chest: Inspiratory crackles in lower lung fields bilaterally, no wheezing or rhonchi Abdominal: Soft, non-tender, non-distended, BS + Extremities: 2+ pitting lower extremity edema  bilaterally. Left hand edematous, but no erythema, 2+ radial pulse, normal strength. Skin: Warm, dry and intact.  Psychiatric: Normal mood and affect. speech and behavior is normal. Cognition and memory are grossly normal.   LABS: Basic Metabolic Panel:  Recent Labs  10/09/15 0335 10/10/15 0212  NA 135 135  K 4.3 4.7  CL 104 102  CO2 17* 18*  GLUCOSE 140* 163*  BUN 90* 101*  CREATININE 3.84* 5.32*  CALCIUM 8.0* 8.3*  PHOS 6.8* 8.4*   Liver Function Tests:  Recent Labs  10/09/15 0335 10/10/15 0212  ALBUMIN 2.6* 2.6*   CBC:  Recent Labs  10/09/15 0335 10/10/15 0212  WBC 11.3* 11.4*  HGB 8.2* 8.1*  HCT 26.6* 26.2*  MCV 86.4 85.9  PLT 286 288   Coag Panel:   Lab Results  Component Value Date   INR 1.23 10/03/2015   INR 0.97 10/02/2009   TEE 10/09/15: - Left ventricle: Systolic function was normal. The estimated  ejection fraction was in the range of 55% to 60%. No evidence of  thrombus. - Aortic valve: No evidence of vegetation. There was mild  regurgitation. - Mitral valve: There was mild regurgitation. - Left atrium: The atrium was moderately to severely dilated. No  evidence of thrombus in the atrial cavity or appendage. No  evidence of thrombus in the atrial cavity or appendage. No  evidence of thrombus in the atrial cavity or appendage. -  Atrial septum: No defect or patent foramen ovale was identified  by color flow Doppler. - Tricuspid valve: There was moderate regurgitation.  RADIOLOGY: US Carotid Duplex Bilateral  09/29/2015  CLINICAL DATA:  Bilateral carotid artery stenosis. EXAM: BILATERAL CAROTID DUPLEX ULTRASOUND TECHNIQUE: Pearline Cables scale imaging, color Doppler and duplex ultrasound were performed of bilateral carotid and vertebral arteries in the neck. COMPARISON:  11/24/2014. FINDINGS: Criteria: Quantification of carotid stenosis is based on velocity parameters that correlate the residual internal carotid diameter with NASCET-based stenosis  levels, using the diameter of the distal internal carotid lumen as the denominator for stenosis measurement. The following velocity measurements were obtained: RIGHT ICA:  113/16 cm/sec CCA:  123456 cm/sec SYSTOLIC ICA/CCA RATIO:  1.0 DIASTOLIC ICA/CCA RATIO:  1.5 ECA:  128 cm/sec LEFT ICA:  393/82 cm/sec CCA:  [id number] cm/sec SYSTOLIC ICA/CCA RATIO:  4.6 DIASTOLIC ICA/CCA RATIO:  5.8 ECA:  160 cm/sec RIGHT CAROTID ARTERY: Mild to moderate right carotid bifurcation atherosclerotic vascular disease. No flow limiting stenosis. Degree of stenosis less than 50%. RIGHT VERTEBRAL ARTERY:  Patent with antegrade flow. LEFT CAROTID ARTERY: Prominent left carotid bifurcation and proximal ICA atherosclerotic vascular plaque. Degree of stenosis greater than 70%. LEFT VERTEBRAL ARTERY:  Patent antegrade flow. IMPRESSION: 1. Greater than 70 degrees stenosis left carotid bifurcation and proximal ICA. Vascular surgery consultation suggested. 2. Atherosclerotic vascular plaque right carotid bifurcation. Degree of stenosis less than 50%. Vertebral arteries are patent antegrade flow. Electronically Signed   By: Marcello Moores  Register   On: 09/29/2015 13:15   US Renal Port  10/05/2015  CLINICAL DATA:  Chronic renal failure with acute exacerbation EXAM: RENAL ULTRASOUND COMPARISON:  September 05, 2008 FINDINGS: Right Kidney: Length: 11.2 cm. Echogenicity within normal limits. Renal cortical thickness is low normal. No mass, perinephric fluid, or hydronephrosis visualized. No sonographically demonstrable calculus or ureterectasis. Left Kidney: Length: 11.4 cm. Echogenicity within normal limits. Renal cortical thickness is low normal. No mass, perinephric fluid, or hydronephrosis visualized. No sonographically demonstrable calculus or ureterectasis. Bladder: Urinary bladder is decompressed with a Foley catheter and cannot be assessed. IMPRESSION: Low normal renal cortical thickness, a finding that may be seen with medical renal disease. No increase  in renal echogenicity or evidence of obstructing focus on either side. Study otherwise unremarkable. Electronically Signed   By: Lowella Grip III M.D.   On: 10/05/2015 17:47   Dg Chest Port 1 View  10/05/2015  CLINICAL DATA:  Pulmonary edema, history of coronary artery disease, CHF, CABG. Acute respiratory failure with hypoxia. EXAM: PORTABLE CHEST 1 VIEW COMPARISON:  Portable chest x-ray of Oct 04, 2015 FINDINGS: The lungs are well-expanded. The interstitial markings remain increased. The left hemidiaphragm is slightly better demonstrated today but increased retrocardiac density persists. The cardiac silhouette remains enlarged. The pulmonary vascularity is slightly less engorged. There are post CABG changes. IMPRESSION: CHF superimposed upon COPD. Pulmonary interstitial and mild alveolar edema, slightly improved. Left basilar atelectasis or pneumonia and small left pleural effusion. Electronically Signed   By: David  Martinique M.D.   On: 10/05/2015 07:25   Dg Chest Port 1 View  10/04/2015  CLINICAL DATA:  Hypoxia.  Dyspnea EXAM: PORTABLE CHEST 1 VIEW COMPARISON:  10/03/2015 FINDINGS: Airspace opacities in the central and basilar regions have worsened from the earlier study. Probable left pleural effusion. Moderate vascular and interstitial congestion. Moderate cardiomegaly. IMPRESSION: The findings likely represent congestive heart failure. Infectious infiltrates cannot be excluded. Electronically Signed   By: Andreas Newport M.D.   On: 10/04/2015 01:39  Dg Chest Port 1 View  10/03/2015  CLINICAL DATA:  Shortness of breath EXAM: PORTABLE CHEST 1 VIEW COMPARISON:  10/02/2015 FINDINGS: Cardiomegaly again noted. Status post CABG. Patchy airspace disease bilaterally especially lower lobes again noted. Probable small left pleural effusion with left basilar atelectasis or infiltrate. Central mild vascular congestion without convincing pulmonary edema. IMPRESSION: Patchy airspace disease bilaterally  especially lower lobes again noted. Probable small left pleural effusion and left basilar atelectasis or infiltrate. Central mild vascular congestion without convincing pulmonary edema. Electronically Signed   By: Lahoma Crocker M.D.   On: 10/03/2015 08:37   Dg Chest Port 1 View  10/02/2015  CLINICAL DATA:  Acute respiratory failure. EXAM: PORTABLE CHEST 1 VIEW COMPARISON:  10/01/2015 FINDINGS: Airspace opacities persist throughout the central and basilar regions of both lungs, without significant interval change. No pneumothorax. Unchanged cardiomegaly. IMPRESSION: No significant interval change in the bilateral airspace opacities. Electronically Signed   By: Andreas Newport M.D.   On: 10/02/2015 23:10   Dg Chest Port 1 View  10/01/2015  CLINICAL DATA:  Initial evaluation for acute chest pain. EXAM: PORTABLE CHEST 1 VIEW COMPARISON:  Prior radiograph from 09/30/2015. FINDINGS: Median sternotomy wires with underlying CABG markers again noted. Cardiomegaly stable. Atheromatous plaque within the aortic arch. Lungs mildly hypoinflated with elevation left hemidiaphragm. Interval worsening and patchy right perihilar and basilar opacity, worrisome for progressive pneumonia. Additional patchy opacity at the left lung base may reflect atelectasis or infiltrate. Mild vascular congestion without overt pulmonary edema. No definite pleural effusion, although the right costophrenic angle is incompletely visualized. No pneumothorax. No acute osseus abnormality. IMPRESSION: 1. Interval worsening in patchy right perihilar and basilar opacity, worrisome for progressive pneumonia. 2. Elevation of the left hemidiaphragm with additional patchy left basilar patchy opacity, which may reflect atelectasis or additional infiltrate. 3. Stable cardiomegaly with mild pulmonary vascular congestion without overt pulmonary edema. Electronically Signed   By: Jeannine Boga M.D.   On: 10/01/2015 05:15   Dg Chest Port 1 View  09/30/2015   CLINICAL DATA:  Dyspnea, cough, fever. EXAM: PORTABLE CHEST 1 VIEW COMPARISON:  05/31/2015 FINDINGS: There is unchanged moderate cardiomegaly. There is patchy right base opacity which could represent pneumonia. No large effusion. IMPRESSION: Patchy right lung base opacity, potentially pneumonia although hemorrhage or asymmetric edema could also produce this appearance. Electronically Signed   By: Andreas Newport M.D.   On: 09/30/2015 22:33   ASSESSMENT/PLAN:   Briana [name] is a 70 yo female with PMHx of CAD s/p CABG  In 2005, carotid artery stenosis, chronic diastolic CHF who presented to [facility] ED on 5/6 with complaint of 24 hours of chest pain and found to be in acute respiratory failure with hypoxia likely secondary to CAP. Cardiology was consulted for chest pain and new onset afib with RVR.  Chest Pain: In the setting of CAD s/p CABG 2005. EKG revealed lateral ST depressions. Troponins have trended 1.15>2.13>1.55>0.66>0.77>1.19>0.86. Given her history of CAD, plans were made for myoview on 5/16. -Stress myoview today, NPO -Nitroglycerin prn  -ASA 81 mg daily -Atorvastatin 80 mg daily  -On Heparin gtt, transition to warfarin for afib pending myoview results -On metoprolol 100 mg BID  Sinus Bradycardia: New onset afib s/p TEE with successful cardioversion yesterday into sinus bradycardia. Given bradycardia in 50s, we will continue to hold cardizem. IV amiodarone was transitioned to po amiodarone.  -Currently on heparin, transition to coumadin pending myoview results -Amiodarone 200 mg BID  -Metoprolol 100 mg BID  Chronic HFpEF:  Echo 0000000, grade 3 diastolic dysfunction. Holding lasix due to worsening kidney function. Evidence of lower extremity edema and inspiratory crackles. May require volume removal soon. 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 Lasix has been held given worsening function. We were hoping for improvement with heart rate control. Discussed with primary, plans to place foley to determine urinary retention. Will likely get a nephrology consult.  -Consider nephrology consultation -Holding Lasix  Left Hand Edema: Normal strength, radial pulse and sensation. IV site working properly. Doubt thrombus given that patient is on heparin. No erythema. Given patient has good IV access in RUE, will d/c LUE IV. -D/C LUE IV -Monitor  DVT/PE ppx: On heparin gtt, transition to coumadin pending myoview results  Martyn Malay, DO PGY-2 Internal Medicine Resident Pager # [phone] 10/10/2015 10:22 AM

## 2015-10-11 ENCOUNTER — Other Ambulatory Visit (HOSPITAL_COMMUNITY): Payer: Medicare Other

## 2015-10-11 ENCOUNTER — Inpatient Hospital Stay (HOSPITAL_COMMUNITY): Payer: Medicare Other

## 2015-10-11 DIAGNOSIS — R931 Abnormal findings on diagnostic imaging of heart and coronary circulation: Secondary | ICD-10-CM

## 2015-10-11 DIAGNOSIS — N170 Acute kidney failure with tubular necrosis: Secondary | ICD-10-CM

## 2015-10-11 DIAGNOSIS — J811 Chronic pulmonary edema: Secondary | ICD-10-CM | POA: Diagnosis present

## 2015-10-11 DIAGNOSIS — I161 Hypertensive emergency: Secondary | ICD-10-CM | POA: Diagnosis present

## 2015-10-11 DIAGNOSIS — R9439 Abnormal result of other cardiovascular function study: Secondary | ICD-10-CM | POA: Diagnosis present

## 2015-10-11 LAB — GLUCOSE, CAPILLARY
GLUCOSE-CAPILLARY: 142 mg/dL — AB (ref 65–99)
GLUCOSE-CAPILLARY: 74 mg/dL (ref 65–99)
Glucose-Capillary: 114 mg/dL — ABNORMAL HIGH (ref 65–99)
Glucose-Capillary: 187 mg/dL — ABNORMAL HIGH (ref 65–99)

## 2015-10-11 LAB — CBC
HEMATOCRIT: 26.5 % — AB (ref 36.0–46.0)
HEMOGLOBIN: 8.1 g/dL — AB (ref 12.0–15.0)
MCH: 26.1 pg (ref 26.0–34.0)
MCHC: 30.6 g/dL (ref 30.0–36.0)
MCV: 85.5 fL (ref 78.0–100.0)
Platelets: 294 10*3/uL (ref 150–400)
RBC: 3.1 MIL/uL — AB (ref 3.87–5.11)
RDW: 15.7 % — ABNORMAL HIGH (ref 11.5–15.5)
WBC: 11.8 10*3/uL — ABNORMAL HIGH (ref 4.0–10.5)

## 2015-10-11 LAB — RENAL FUNCTION PANEL
ANION GAP: 17 — AB (ref 5–15)
Albumin: 2.6 g/dL — ABNORMAL LOW (ref 3.5–5.0)
BUN: 111 mg/dL — ABNORMAL HIGH (ref 6–20)
CALCIUM: 8.6 mg/dL — AB (ref 8.9–10.3)
CO2: 17 mmol/L — AB (ref 22–32)
Chloride: 101 mmol/L (ref 101–111)
Creatinine, Ser: 6.47 mg/dL — ABNORMAL HIGH (ref 0.44–1.00)
GFR calc non Af Amer: 6 mL/min — ABNORMAL LOW (ref >60)
GFR, EST AFRICAN AMERICAN: 7 mL/min — AB (ref >60)
Glucose, Bld: 110 mg/dL — ABNORMAL HIGH (ref 65–99)
PHOSPHORUS: 8.5 mg/dL — AB (ref 2.5–4.6)
POTASSIUM: 4.6 mmol/L (ref 3.5–5.1)
SODIUM: 135 mmol/L (ref 135–145)

## 2015-10-11 LAB — CK: Total CK: 30 U/L — ABNORMAL LOW (ref 38–234)

## 2015-10-11 LAB — HEPARIN LEVEL (UNFRACTIONATED): Heparin Unfractionated: 0.48 IU/mL (ref 0.30–0.70)

## 2015-10-11 MED ORDER — SODIUM CHLORIDE 0.9% FLUSH
10.0000 mL | Freq: Two times a day (BID) | INTRAVENOUS | Status: DC
  Administered 2015-10-11 – 2015-10-17 (×9): 10 mL

## 2015-10-11 MED ORDER — HEPARIN 1000 UNIT/ML FOR PERITONEAL DIALYSIS: 1000.0000 [IU] | INTRAMUSCULAR | Status: DC | PRN

## 2015-10-11 MED ORDER — HEPARIN (PORCINE) IN NACL 100-0.45 UNIT/ML-% IJ SOLN
1600.0000 [IU]/h | INTRAMUSCULAR | Status: DC
  Administered 2015-10-12 (×2): 1600 [IU]/h via INTRAVENOUS
  Filled 2015-10-11 (×2): qty 250

## 2015-10-11 MED ORDER — AMLODIPINE BESYLATE 5 MG PO TABS
5.0000 mg | ORAL_TABLET | Freq: Every day | ORAL | Status: DC
  Administered 2015-10-11 – 2015-10-12 (×2): 5 mg via ORAL
  Filled 2015-10-11 (×2): qty 1

## 2015-10-11 MED ORDER — SODIUM CHLORIDE 0.9% FLUSH
10.0000 mL | INTRAVENOUS | Status: DC | PRN
  Administered 2015-10-16: 10 mL
  Filled 2015-10-11: qty 40

## 2015-10-11 MED ORDER — HEPARIN SODIUM (PORCINE) 1000 UNIT/ML IJ SOLN
1000.0000 [IU] | INTRAMUSCULAR | Status: DC | PRN
  Administered 2015-10-11: 3000 [IU]
  Filled 2015-10-11: qty 6
  Filled 2015-10-11: qty 3

## 2015-10-11 NOTE — Progress Notes (Signed)
[place] TEAM 1 - Stepdown/ICU TEAM Progress Note  Briana [name] [id number] DOB: [birth year] DOA: 09/30/2015 PCP: Antionette Fairy, PA-C  Admit HPI / Brief Narrative: Briana [name] is an 70 y.o. 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 In ED found to have: hypoxia, a fever to 101.6, WBC of 15.5, EKG changes (lateral ST depression), mild troponin elevation (0.12). BNP was elevated to 894.0, previously was 391. She had a CXR which showed alveolar opacities, most dense in the Rt base. Cultures were obtained, she was started on CAP coverage, and due to the hypoxia was transferred to [facility] for tx of pneumonia.    HPI/Subjective: 5/17 A/O 4, negative CP, decreased SOB. Sitting in chair. Base weight is 102 kg (225 pounds). Does not weigh herself daily. States prior to admission no previous episode of A. fib with RVR. Understand she will require HD  Assessment/Plan: Acute hypoxic Respiratory Failure in setting of bilateral pulmonary infiltrates. PNA vs Pulmonary Edema -continue current antibiotics 5 days  R/o PE; although think we have fairly good explanation for her hypoxia  -LE dopplers neg -Obtain VQ scan once A. fib RVR controlled  Possible CAP -Complete 5 day course antibiotics  CAD native artery  Acute on chronic Diastolic CHF/Pulmonary Hypertension -Hold all anticoagulants secondary to possible GI bleed; -Strict in and out since admission +2.2 L -Daily weight Filed Weights   10/07/15 0233 10/09/15 0500 10/11/15 0400  Weight: 104.3 kg (229 lb 15 oz) 106.9 kg (235 lb 10.8 oz) 111.5 kg (245 lb 13 oz)  -Transfuse for hemoglobin<8 -5/9 Transfuse 1 unit PRBC  A-Fib RVR/RBBB(CHA2DS2 VASc score is 6+) - EKG shows A. fib RVR with RBBB -s/p DCCV 5/15    -See CHF - Cards following   HTN emergency w/ associated pulmonary edema  -Amiodarone 200 mg BID -Amlodipine 5 mg daily -Metoprolol 100 mg BID   Acute on CKD (baseline Cr~1.6 -2 ) -Renal US; nondiagnostic for increased failure Lab Results  Component Value Date   CREATININE 6.47* 10/11/2015   CREATININE 5.32* 10/10/2015   CREATININE 3.84* 10/09/2015  -Per nephrology if patient's U OP does not pick up patient will go to HD In Am  Acute blood loss anemia? -Hemoglobin stable  -hemoccult negative  Recent Labs     10/09/15  0335  10/10/15  0212  10/11/15  0330  HGB  8.2*  8.1*  8.1*   Leukocytosis -Resolved   DM Type 2 controlled with complication -123456 hemoglobin A1c= 6.9 - -NovoLog 70/30  15 units BID -Resistant SSI  Depression -Zoloft 100 mg daily   Deconditioning -PT/OT, evaluate patient with new onset A. fib RVR with deconditioning for CIR vs SNF   Code Status: FULL Family Communication:  Disposition Plan: Resolution of A. fib RVR    Consultants: Dr.Rakesh Elsworth [facility]  Dr. Glenetta [facility]    Procedure/Significant Events: CXR -- alveolar filling pattern, most dense in RLL. EKG - lateral ST depression, TWI throughout 5/7 Bilat LE Doppler; Negative DVT/SVT 5/8 echocardiogram;Left ventricle: mild focal basal hypertrophy of the septum. 55% to 60%. -(grade 3 diastolic dysfunction).-Left atrium: severely dilated. - Tricuspid valve: moderate regurgitation. - Pulmonary arteries: PA peak pressure: 69 mm Hg (S). 5/9 Transfuse 1 unit PRBC 5/11 renal ultrasound;-Low  normal renal cortical thickness, a finding that may be seen with medical renal disease. . 5/12 RVR persists  5/15 TEE w/ DCCV > sinus bradycardia   Culture Blood 5/6 left arm/right hand NGTD Urine 5/6 negative final 5/7 MRSA by PCR negative  Antibiotics: Azithromycin 5/6 >>5/10 Ceftriaxone 5/6 >> 5/11  DVT prophylaxis: Heparin  drip  Devices    LINES / TUBES:     Continuous Infusions: . sodium chloride 10 mL/hr at 10/07/15 2214  . sodium chloride    . sodium chloride    . sodium chloride    . heparin 1,600 Units/hr (10/11/15 1900)    Objective: VITAL SIGNS: Temp: 98 F (36.7 C) (05/17 1621) Temp Source: Oral (05/17 1621) BP: 154/66 mmHg (05/17 1621) Pulse Rate: 67 (05/17 1621) SPO2; FIO2:   Intake/Output Summary (Last 24 hours) at 10/11/15 1948 Last data filed at 10/11/15 1700  Gross per 24 hour  Intake    968 ml  Output    180 ml  Net    788 ml     Exam: General: A/O 4, positive acute respiratory distress Eyes: negative scleral hemorrhage, negative icterus ENT: Negative Runny nose, negative gingival bleeding, Neck:  Negative scars, masses, torticollis, lymphadenopathy, JVD Lungs: diminished breath sounds, Positive crackles bilateral diffusely   Cardiac; regular rhythm and rate, negative murmurs rubs or gallops, normal S1/S2 Abdomen: Morbidly Obese, negative abdominal pain, nondistended, positive soft, bowel sounds, no rebound, no ascites, no appreciable mass Extremities: No significant cyanosis, clubbing, positive bilateral lower extremity edema to hips 2- 3+ Psychiatric:  Negative depression, negative anxiety, negative fatigue, negative mania  Neurologic:  Cranial nerves II through XII intact, tongue/uvula midline, all extremities muscle strength 5/5, sensation intact throughout, negative dysarthria, negative expressive aphasia, negative receptive aphasia.    Data Reviewed: Basic Metabolic Panel:  Recent Labs Lab 10/06/15 0253 10/07/15 0312 10/08/15 0228 10/09/15 0335 10/10/15 0212 10/11/15 0322  NA 137 138 137 135 135 135  K 4.2 4.2 4.4 4.3 4.7 4.6  CL 105 105 104 104 102 101  CO2 19* 20* 19* 17* 18* 17*  GLUCOSE 132* 129* 179* 140* 163* 110*  BUN 77* 81* 85* 90* 101* 111*  CREATININE 2.59* 2.86* 3.23* 3.84* 5.32* 6.47*  CALCIUM 8.2* 8.3* 8.1* 8.0* 8.3* 8.6*  MG 2.2   --   --   --   --   --   PHOS 5.1*  --  5.8* 6.8* 8.4* 8.5*   Liver Function Tests:  Recent Labs Lab 10/05/15 0217 10/07/15 0312 10/08/15 0228 10/09/15 0335 10/10/15 0212 10/11/15 0322  AST 31 30  --   --   --   --   ALT 23 29  --   --   --   --   ALKPHOS 101 111  --   --   --   --   BILITOT 0.5 0.3  --   --   --   --   PROT 6.1* 6.1*  --   --   --   --   ALBUMIN 2.4* 2.4* 2.4* 2.6* 2.6* 2.6*   No results for input(s): LIPASE, AMYLASE in the last 168 hours. No results for input(s): AMMONIA in the last 168 hours. CBC:  Recent Labs Lab 10/07/15 0312 10/08/15 0228 10/09/15 0335 10/10/15 0212 10/11/15 0330  WBC 11.1* 11.1* 11.3* 11.4* 11.8*  HGB 8.9* 8.2* 8.2* 8.1* 8.1*  HCT 27.6* 26.8* 26.6* 26.2* 26.5*  MCV 86.3 87.3 86.4 85.9 85.5  PLT 299 261 286 288  294   Cardiac Enzymes:  Recent Labs Lab 10/04/15 2158 10/11/15 0322  CKTOTAL  --  30*  TROPONINI 0.60*  --    BNP (last 3 results)  Recent Labs  05/31/15 1522 09/30/15 2140 10/02/15 2204  BNP 391.0* 894.0* 1262.5*    ProBNP (last 3 results) No results for input(s): PROBNP in the last 8760 hours.  CBG:  Recent Labs Lab 10/10/15 1843 10/10/15 2204 10/11/15 0810 10/11/15 1148 10/11/15 1623  GLUCAP 158* 141* 114* 187* 142*    No results found for this or any previous visit (from the past 240 hour(s)).   Studies:  Recent x-ray studies have been reviewed in detail by the Attending Physician  Scheduled Meds:  Scheduled Meds: . amiodarone  200 mg Oral BID  . amLODipine  5 mg Oral Daily  . aspirin EC  81 mg Oral Daily  . atorvastatin  80 mg Oral QPM  . calcium acetate  1,334 mg Oral TID WC  . insulin aspart  0-20 Units Subcutaneous TID WC  . insulin aspart  0-5 Units Subcutaneous QHS  . insulin aspart protamine- aspart  15 Units Subcutaneous BID WC  . isosorbide-hydrALAZINE  1 tablet Oral TID  . loratadine  10 mg Oral Daily  . metoprolol tartrate  100 mg Oral BID  . sertraline  100 mg Oral  Daily  . sodium bicarbonate  650 mg Oral BID  . sodium chloride flush  10-40 mL Intracatheter Q12H  . sodium chloride flush  3 mL Intravenous Q12H    Time spent on care of this patient: 40 mins   WOODS, Geraldo Docker , MD  [facility] Office  [phone] Pager - [phone]  On-Call/Text Page:      [email or web address]      password [email or web address]  If 7PM-7AM, please contact night-coverage [email or web address] Password [email or web address] 10/11/2015, 7:48 PM   LOS: 10 days   Care during the described time interval was provided by me .  I have reviewed this patient's available data, including medical history, events of note, physical examination, and all test results as part of my evaluation. I have personally reviewed and interpreted all radiology studies.   Dia Crawford, MD [phone] Pager

## 2015-10-11 NOTE — Progress Notes (Signed)
Heparin gtt held per CCM for HD catheter placement. Will resume post placement of line. Pharmacy made aware.

## 2015-10-11 NOTE — Clinical Social Work Note (Signed)
CSW continuing to follow patient's progress.  Patient has been faxed out to SNFs in case CIR not able to accept patient.  Jones Broom. [place], MSW, [place] 10/11/2015 6:35 PM

## 2015-10-11 NOTE — Procedures (Signed)
Hemodialysis Catheter Insertion Procedure Note Briana [name] [id number] November 06, 1945  Procedure: Insertion of Hemodialysis Catheter Indications: HD  Procedure Details Consent: Risks of procedure as well as the alternatives and risks of each were explained to the (patient/caregiver).  Consent for procedure obtained. Time Out: Verified patient identification, verified procedure, site/side was marked, verified correct patient position, special equipment/implants available, medications/allergies/relevent history reviewed, required imaging and test results available.  Performed  Maximum sterile technique was used including antiseptics, cap, gloves, gown, hand hygiene, mask and sheet. Skin prep: Chlorhexidine; local anesthetic administered A antimicrobial bonded/coated triple lumen catheter was placed in the right internal jugular vein using the Seldinger technique.  Evaluation Blood flow good Complications: No apparent complications Patient did tolerate procedure well. Chest X-ray ordered to verify placement.  CXR: pending.  Procedure performed under direct ultrasound guidance for real time vessel cannulation.      Briana [name], [facility] Pgr: [phone]  or [phone] 10/11/2015, 6:05 PM

## 2015-10-11 NOTE — Progress Notes (Signed)
Received order for Renal artery duplex. Spoke with [place], Therapist, sports. Patient just ate. Will need to be NPO for test. Patient will be NPO after midnight and duplex will be performed 5/18 AM   Landry Mellow, RDMS, RVT Vascular lab

## 2015-10-11 NOTE — Progress Notes (Signed)
Occupational Therapy Treatment Patient Details Name: Briana [name] MRN: [id number] DOB: July 16, 1945 Today's Date: 10/11/2015    History of present illness Pt adm with acute hypoxic Respiratory Failure in setting of bilateral pulmonary infiltrates. Favor acute Pulmonary Edema over PNA. Pt placed on bipap. Cardiology was consulted 5/7 due to pt with chest pain.  EKG showed lateral ST depression and mild troponin elevation.  She developed A-Fib with RVR on 5/9.  She underwent cardioversion to sinus bradycardia on 5/15.  Myoview on 5/16 showed EF 45% and grade 3 Diastolic dysfunction.   PMH - DM, CAD, diastolic HF.   OT comments  Pt is very motivated.  She requires min - mod A for ADLs.   Activity tolerance is improving.  Recommend CIR.   Follow Up Recommendations  CIR    Equipment Recommendations  None recommended by OT    Recommendations for Other Services      Precautions / Restrictions Precautions Precautions: Fall       Mobility Bed Mobility                  Transfers Overall transfer level: Needs assistance Equipment used: Rolling walker (2 wheeled) Transfers: Sit to/from [employer] Sit to Stand: Supervision Stand pivot transfers: Min guard            Balance                                   ADL Overall ADL's : Needs assistance/impaired                     Lower Body Dressing: Maximal assistance;Sit to/from stand Lower Body Dressing Details (indicate cue type and reason): Pt unable to access bil. feet for LB ADLs  Toilet Transfer: Min guard;RW;BSC;Stand-pivot   Toileting- Water quality scientist and Hygiene: Min guard;Sit to/from stand       Functional mobility during ADLs: Min guard;Rolling walker General ADL Comments: Pt is very motivated       Tourist information centre manager   Behavior During Therapy: WFL for tasks assessed/performed Overall Cognitive  Status: Within Functional Limits for tasks assessed                       Extremity/Trunk Assessment               Exercises General Exercises - Upper Extremity Shoulder Flexion: AROM;Right;Left;10 reps;Standing   Shoulder Instructions       General Comments      Pertinent Vitals/ Pain       Pain Assessment: No/denies pain  Home Living                                          Prior Functioning/Environment              Frequency Min 2X/week     Progress Toward Goals  OT Goals(current goals can now be found in the care plan section)  Progress towards OT goals: Progressing toward goals  Acute Rehab OT Goals Patient Stated Goal: to get stronger  ADL Goals Pt Will Perform Grooming: with set-up;with supervision;standing Pt Will  Perform Upper Body Bathing: with set-up;with supervision;standing;sitting Pt Will Perform Lower Body Bathing: with set-up;with supervision;sit to/from stand Pt Will Perform Upper Body Dressing: with set-up;with supervision;sitting;standing Pt Will Perform Lower Body Dressing: with set-up;with supervision;sit to/from stand Pt Will Transfer to Toilet: with supervision;ambulating;bedside commode Pt Will Perform Toileting - Clothing Manipulation and hygiene: with supervision;sit to/from stand Additional ADL Goal #1: Pt will be Mod I in and OOB for basic ADLs  Plan Discharge plan remains appropriate    Co-evaluation                 End of Session Equipment Utilized During Treatment: Rolling walker;Oxygen   Activity Tolerance Patient tolerated treatment well   Patient Left in chair;with call bell/phone within reach   Nurse Communication Mobility status        Time: [id number] OT Time Calculation (min): 25 min  Charges: OT General Charges $OT Visit: 1 Procedure OT Treatments $Therapeutic Activity: 23-37 mins  Josean Lycan M 10/11/2015, 4:01 PM

## 2015-10-11 NOTE — Progress Notes (Signed)
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 amiodarone  200 mg Oral BID  . amLODipine  5 mg Oral Daily  . aspirin EC  81 mg Oral Daily  . atorvastatin  80 mg Oral QPM  . calcium acetate  1,334 mg Oral TID WC  . insulin aspart  0-20 Units Subcutaneous TID WC  . insulin aspart  0-5 Units Subcutaneous QHS  . insulin aspart protamine- aspart  15 Units Subcutaneous BID WC  . isosorbide-hydrALAZINE  1 tablet Oral TID  . loratadine  10 mg Oral Daily  . metoprolol tartrate  100 mg Oral BID  . sertraline  100 mg Oral Daily  . sodium bicarbonate  650 mg Oral BID  . sodium chloride flush  3 mL Intravenous Q12H   Continuous Infusions: . sodium chloride 10 mL/hr at 10/07/15 2214  . sodium chloride    . sodium chloride    . sodium chloride    . heparin 1,600 Units/hr (10/11/15 [id number])   PRN Meds:.HYDROcodone-acetaminophen, nitroGLYCERIN, sodium chloride flush, zolpidem  Current Labs: reviewed    Physical Exam:  Blood pressure 173/55, pulse 58, temperature 97.8 F (36.6 C), temperature source Oral, resp. rate 25, height 5\' 5"  (1.651 m), weight 111.5 kg (245 lb 13 oz), SpO2 93 %. GEN: NAD ENT: NCAT EYES: EOMI CV: RRR PULM: Diminished in bases ABD: s/nt/nd SKIN: no rashes/lesions EXT:3+ LEE  RENAL STUDIES 1. Renal US 5/11: negative for obstruction 2. Urine Sediment: numerous bacteria. Scattered brown pigmented granular casts. Occ monomorphic RBC no dysmorphic features. No cellular casts 3. Renal Doppler 5/17:  ordered  Assessment  1. AoCKD3 2/2 ATN 1. No obstruction or obvious nephrotoxins 2. Diuretics have been held for several days 3. Based on urine sediment with muddy brown casts, looksl ike has had ATN 2. dCHF Exacerbation 3. CAP s/p Azithro and Ceftriaxone 4. AFib s/p DCCV 5/15, successful 5. HTN 6. Obesity 7. DM2 8. CAD s/p CABG, r/o ACS with Nuc ST: cardiology following  Plan 1. Check Renal Duplex 2. Add on CK 3. If UOP isn't picking up, will place HD cath, will ask CCM assistance 4. Probably would tolerate IHD, won't need CRRT 5. Daily weights, Daily Renal Panel, Strict I/Os, Avoid nephrotoxins (NSAIDs, judicious IV Contrast)   Pearson Grippe MD 10/11/2015, 9:58 AM   Recent Labs Lab 10/09/15 0335 10/10/15 0212 10/11/15 0322  NA 135 135 135  K 4.3 4.7 4.6  CL 104 102 101  CO2 17* 18* 17*  GLUCOSE 140* 163* 110*  BUN 90* 101* 111*  CREATININE 3.84* 5.32* 6.47*  CALCIUM 8.0* 8.3* 8.6*  PHOS 6.8* 8.4* 8.5*    Recent Labs Lab 10/09/15 0335 10/10/15 0212 10/11/15 0330  WBC 11.3* 11.4* 11.8*  HGB 8.2* 8.1* 8.1*  HCT 26.6* 26.2* 26.5*  MCV 86.4 85.9 85.5  PLT 286 288 294

## 2015-10-11 NOTE — Progress Notes (Signed)
HISTORY: Ms. Delio is a 70 yo female with PMHx of CAD s/p CABG  In 2005, carotid artery stenosis, chronic diastolic CHF, OSA, HTN, HLD, and CKD who presented to [facility] ED on 5/6 with complaint of 24 hours of chest pain. Patient was found to be in acute respiratory failure with hypoxia likely secondary to CAP. Cardiology was consulted for chest pain on 5/7. EKG showed lateral ST depression with a mild troponin elevation at 0.12. Patient developed atrial fibrillation with RVR on 5/9 and is s/p successful cardioversion to sinus bradycardia on 5/15.  SUBJECTIVE:  Patient was seen and examined this morning. Patient admits to increased orthopnea, but denies shortness of breath while sitting up in the chair. She denies chest discomfort or nausea. She is hungry and wants to eat.   OBJECTIVE:   Vitals:   Filed Vitals:   10/11/15 0000 10/11/15 0400 10/11/15 0800 10/11/15 0808  BP: 152/50 144/51 173/55 173/55  Pulse: 57 55 59 58  Temp:  97.7 F (36.5 C)  97.8 F (36.6 C)  TempSrc:  Oral  Oral  Resp: 25 26 28 25   Height:      Weight:  245 lb 13 oz (111.5 kg)    SpO2: 94% 93% 94% 93%   I&O's:    Intake/Output Summary (Last 24 hours) at 10/11/15 [id number] Last data filed at 10/11/15 0800  Gross per 24 hour  Intake    368 ml  Output     60 ml  Net    308 ml   TELEMETRY:   ECHO 5/8: Impressions: - The right ventricular systolic pressure was increased consistent  with severe pulmonary hypertension. PA peak pressure 69.  - EF 0000000, grade 3 diastolic dysfunction  PHYSICAL EXAM General: Vital signs reviewed.  Patient is elderly, in no acute distress and cooperative with exam.  Cardiovascular: Bradycardic, regular rhythm, S1 normal, S2 normal Pulmonary/Chest: Inspiratory crackles bilaterally up to mid lung fields, no wheezing or rhonchi. Abdominal: Soft, non-tender, non-distended, BS + Extremities: 2+ pitting edema in lower extremities to knees bilaterally Skin: Warm, dry and intact.  No rashes or erythema.  LABS: Basic Metabolic Panel:  Recent Labs  10/10/15 0212 10/11/15 0322  NA 135 135  K 4.7 4.6  CL 102 101  CO2 18* 17*  GLUCOSE 163* 110*  BUN 101* 111*  CREATININE 5.32* 6.47*  CALCIUM 8.3* 8.6*  PHOS 8.4* 8.5*   Liver Function Tests:  Recent Labs  10/10/15 0212 10/11/15 0322  ALBUMIN 2.6* 2.6*   CBC:  Recent Labs  10/10/15 0212 10/11/15 0330  WBC 11.4* 11.8*  HGB 8.1* 8.1*  HCT 26.2* 26.5*  MCV 85.9 85.5  PLT 288 294   Coag Panel:   Lab Results  Component Value Date   INR 1.23 10/03/2015   INR 0.97 10/02/2009   TEE 10/09/15: - Left ventricle: Systolic function was normal. The estimated  ejection fraction was in the range of 55% to 60%. No evidence of  thrombus. - Aortic valve: No evidence of vegetation. There was mild  regurgitation. - Mitral valve: There was mild regurgitation. - Left atrium: The atrium was moderately to severely dilated. No  evidence of thrombus in the atrial cavity or appendage. No  evidence of thrombus in the atrial cavity or appendage. No  evidence of thrombus in the atrial cavity or appendage. - Atrial septum: No defect or patent foramen ovale was identified  by color flow Doppler. - Tricuspid valve: There was moderate regurgitation.  RADIOLOGY: US  Carotid Duplex Bilateral  09/29/2015  CLINICAL DATA:  Bilateral carotid artery stenosis. EXAM: BILATERAL CAROTID DUPLEX ULTRASOUND TECHNIQUE: Pearline Cables scale imaging, color Doppler and duplex ultrasound were performed of bilateral carotid and vertebral arteries in the neck. COMPARISON:  11/24/2014. FINDINGS: Criteria: Quantification of carotid stenosis is based on velocity parameters that correlate the residual internal carotid diameter with NASCET-based stenosis levels, using the diameter of the distal internal carotid lumen as the denominator for stenosis measurement. The following velocity measurements were obtained: RIGHT ICA:  113/16 cm/sec CCA:  [id number]  cm/sec SYSTOLIC ICA/CCA RATIO:  1.0 DIASTOLIC ICA/CCA RATIO:  1.5 ECA:  128 cm/sec LEFT ICA:  393/82 cm/sec CCA:  [id number] cm/sec SYSTOLIC ICA/CCA RATIO:  4.6 DIASTOLIC ICA/CCA RATIO:  5.8 ECA:  160 cm/sec RIGHT CAROTID ARTERY: Mild to moderate right carotid bifurcation atherosclerotic vascular disease. No flow limiting stenosis. Degree of stenosis less than 50%. RIGHT VERTEBRAL ARTERY:  Patent with antegrade flow. LEFT CAROTID ARTERY: Prominent left carotid bifurcation and proximal ICA atherosclerotic vascular plaque. Degree of stenosis greater than 70%. LEFT VERTEBRAL ARTERY:  Patent antegrade flow. IMPRESSION: 1. Greater than 70 degrees stenosis left carotid bifurcation and proximal ICA. Vascular surgery consultation suggested. 2. Atherosclerotic vascular plaque right carotid bifurcation. Degree of stenosis less than 50%. Vertebral arteries are patent antegrade flow. Electronically Signed   By: Marcello Moores  Register   On: 09/29/2015 13:15   Nm Myocar Multi W/spect W/wall Motion / Ef  10/10/2015  CLINICAL DATA:  70 year old with chest pain. EXAM: MYOCARDIAL IMAGING WITH SPECT (REST AND PHARMACOLOGIC-STRESS) GATED LEFT VENTRICULAR WALL MOTION STUDY LEFT VENTRICULAR EJECTION FRACTION TECHNIQUE: Standard myocardial SPECT imaging was performed after resting intravenous injection of 10 mCi Tc-17m tetrofosmin. Subsequently, intravenous infusion of Lexiscan was performed under the supervision of the Cardiology staff. At peak effect of the drug, 30 mCi Tc-1m tetrofosmin was injected intravenously and standard myocardial SPECT imaging was performed. Quantitative gated imaging was also performed to evaluate left ventricular wall motion, and estimate left ventricular ejection fraction. COMPARISON:  Chest radiograph 10/05/2015 FINDINGS: Perfusion: There are fixed defects involving the anterior, anteroseptal and lateral walls. These findings are compatible with areas of infarct. In addition, there is concern for mild reversibility  in the mid segments of the anteroseptal wall and anterior wall. These areas are concerning for peri-infarct ischemia. Wall Motion: Dyskinesia at the apex. Left Ventricular Ejection Fraction: 45 % End diastolic volume [id number] ml End systolic volume 68 ml IMPRESSION: 1. Concern for mild peri-infarct ischemia involving the anterior wall and anteroseptal wall. Evidence for infarcts involving the anterior and lateral walls. 2. Dyskinesia at the apex. 3. Left ventricular ejection fraction is 45%. 4. Non invasive risk stratification*: Intermediate *2012 Appropriate Use Criteria for Coronary Revascularization Focused Update: J Am Coll Cardiol. [id number]. [email or web address]://[email or web address] Electronically Signed   By: Markus Daft M.D.   On: 10/10/2015 17:08   US Renal Port  10/05/2015  CLINICAL DATA:  Chronic renal failure with acute exacerbation EXAM: RENAL ULTRASOUND COMPARISON:  September 05, 2008 FINDINGS: Right Kidney: Length: 11.2 cm. Echogenicity within normal limits. Renal cortical thickness is low normal. No mass, perinephric fluid, or hydronephrosis visualized. No sonographically demonstrable calculus or ureterectasis. Left Kidney: Length: 11.4 cm. Echogenicity within normal limits. Renal cortical thickness is low normal. No mass, perinephric fluid, or hydronephrosis visualized. No sonographically demonstrable calculus or ureterectasis. Bladder: Urinary bladder is decompressed with a Foley catheter and cannot be assessed. IMPRESSION: Low normal renal cortical thickness, a finding that may be seen with medical renal disease. No increase  in renal echogenicity or evidence of obstructing focus on either side. Study otherwise unremarkable. Electronically Signed   By: Lowella Grip III M.D.   On: 10/05/2015 17:47   Dg Chest Port 1 View  10/05/2015  CLINICAL DATA:  Pulmonary edema, history of coronary artery disease, CHF, CABG. Acute respiratory failure with hypoxia. EXAM: PORTABLE CHEST  1 VIEW COMPARISON:  Portable chest x-ray of Oct 04, 2015 FINDINGS: The lungs are well-expanded. The interstitial markings remain increased. The left hemidiaphragm is slightly better demonstrated today but increased retrocardiac density persists. The cardiac silhouette remains enlarged. The pulmonary vascularity is slightly less engorged. There are post CABG changes. IMPRESSION: CHF superimposed upon COPD. Pulmonary interstitial and mild alveolar edema, slightly improved. Left basilar atelectasis or pneumonia and small left pleural effusion. Electronically Signed   By: David  Martinique M.D.   On: 10/05/2015 07:25   Dg Chest Port 1 View  10/04/2015  CLINICAL DATA:  Hypoxia.  Dyspnea EXAM: PORTABLE CHEST 1 VIEW COMPARISON:  10/03/2015 FINDINGS: Airspace opacities in the central and basilar regions have worsened from the earlier study. Probable left pleural effusion. Moderate vascular and interstitial congestion. Moderate cardiomegaly. IMPRESSION: The findings likely represent congestive heart failure. Infectious infiltrates cannot be excluded. Electronically Signed   By: Andreas Newport M.D.   On: 10/04/2015 01:39   Dg Chest Port 1 View  10/03/2015  CLINICAL DATA:  Shortness of breath EXAM: PORTABLE CHEST 1 VIEW COMPARISON:  10/02/2015 FINDINGS: Cardiomegaly again noted. Status post CABG. Patchy airspace disease bilaterally especially lower lobes again noted. Probable small left pleural effusion with left basilar atelectasis or infiltrate. Central mild vascular congestion without convincing pulmonary edema. IMPRESSION: Patchy airspace disease bilaterally especially lower lobes again noted. Probable small left pleural effusion and left basilar atelectasis or infiltrate. Central mild vascular congestion without convincing pulmonary edema. Electronically Signed   By: Lahoma Crocker M.D.   On: 10/03/2015 08:37   Dg Chest Port 1 View  10/02/2015  CLINICAL DATA:  Acute respiratory failure. EXAM: PORTABLE CHEST 1 VIEW  COMPARISON:  10/01/2015 FINDINGS: Airspace opacities persist throughout the central and basilar regions of both lungs, without significant interval change. No pneumothorax. Unchanged cardiomegaly. IMPRESSION: No significant interval change in the bilateral airspace opacities. Electronically Signed   By: Andreas Newport M.D.   On: 10/02/2015 23:10   Dg Chest Port 1 View  10/01/2015  CLINICAL DATA:  Initial evaluation for acute chest pain. EXAM: PORTABLE CHEST 1 VIEW COMPARISON:  Prior radiograph from 09/30/2015. FINDINGS: Median sternotomy wires with underlying CABG markers again noted. Cardiomegaly stable. Atheromatous plaque within the aortic arch. Lungs mildly hypoinflated with elevation left hemidiaphragm. Interval worsening and patchy right perihilar and basilar opacity, worrisome for progressive pneumonia. Additional patchy opacity at the left lung base may reflect atelectasis or infiltrate. Mild vascular congestion without overt pulmonary edema. No definite pleural effusion, although the right costophrenic angle is incompletely visualized. No pneumothorax. No acute osseus abnormality. IMPRESSION: 1. Interval worsening in patchy right perihilar and basilar opacity, worrisome for progressive pneumonia. 2. Elevation of the left hemidiaphragm with additional patchy left basilar patchy opacity, which may reflect atelectasis or additional infiltrate. 3. Stable cardiomegaly with mild pulmonary vascular congestion without overt pulmonary edema. Electronically Signed   By: Jeannine Boga M.D.   On: 10/01/2015 05:15   Dg Chest Port 1 View  09/30/2015  CLINICAL DATA:  Dyspnea, cough, fever. EXAM: PORTABLE CHEST 1 VIEW COMPARISON:  05/31/2015 FINDINGS: There is unchanged moderate cardiomegaly. There is patchy right base opacity  which could represent pneumonia. No large effusion. IMPRESSION: Patchy right lung base opacity, potentially pneumonia although hemorrhage or asymmetric edema could also produce this  appearance. Electronically Signed   By: Andreas Newport M.D.   On: 09/30/2015 22:33   ASSESSMENT/PLAN:   Ms. Blacketer is a 70 yo female with PMHx of CAD s/p CABG  In 2005, carotid artery stenosis, chronic diastolic CHF who presented to [facility] ED on 5/6 with complaint of 24 hours of chest pain and found to be in acute respiratory failure with hypoxia likely secondary to CAP. Cardiology was consulted for chest pain and new onset afib with RVR.  Chest Pain/CAD s/p CABG: Myoview on 5/17 was intermediate and showed EF 45%, dyskinesia at apex, and concern for mild peri-infarct ischemic involving the anterior wall and anterioseptal wall and evidence of infracts involving the anterior and lateral walls. Further evaluation with cath is difficult due to worsening kidney disease. We will discuss with Nephrology the possibility of starting CRRT with future plans of cath in the next 2 weeks.  -Nitroglycerin prn  -ASA 81 mg daily -Atorvastatin 80 mg daily  -On Heparin gtt -On metoprolol 100 mg BID  Sinus Bradycardia: New onset afib s/p TEE with successful cardioversion on 5/15 into sinus bradycardia. -On heparin gtt, will transition to coumadin once no further surgical interventions required -Amiodarone 200 mg BID  -Metoprolol 100 mg BID  HTN: 140-170s/90s this morning. -On metoprolol 100 mg BID -Increase Bidil 20-37.5 1 tablet TID -Add amlodipine 5 mg daily  Chronic HFpEF:  Myoview showed EF of 45%. Previous echo 55-60% with grade 3 diastolic dysfunction. Holding lasix due to worsening kidney function; however, weight is increasing with worsening orthopnea, and evidence of volume overload.  -On metoprolol 100 mg BID -Increase Bidil 20-37.5 1 tablet TID -Add amlodipine 5 mg daily -Holding home lasix -Monitor fluid status and renal function  Acute on Chronic CKD III: Likely 2/2 ATN. ?Thromboembolism to kidney during afib. Creatinine baseline 2.1, worsening this admission and up from  3.84>5.32>6.47 this morning. Patient was seen by nephrology yesterday who believes ARF is 2/2 ATN and recommends supportive care, avoiding nephrotoxins, and monitoring for HD needs. Will discuss with Nephrology the possibility of starting HD given volume status, increasing BUN, and worsening acidosis.  -Nephrology following -Holding Lasix  DVT/PE ppx: On heparin gtt, transition to coumadin once no further interventions required  Martyn Malay, DO PGY-2 Internal Medicine Resident Pager # [phone] 10/11/2015 9:22 AM

## 2015-10-11 NOTE — Progress Notes (Signed)
[place] for Heparin  Indication: afib  Allergies  Allergen Reactions  . Carbamazepine Other (See Comments)    Reaction to tegretol - loopy  . Clonazepam Other (See Comments)    Unknown reaction  . Gemfibrozil Other (See Comments)    Patient is not aware of this allergy but states that she has side effects to a cholesterol medication in the past  . Macrobid [Nitrofurantoin] Nausea And Vomiting  . Oxycodone Other (See Comments)    hallucinations    Patient Measurements: Height: 5\' 5"  (165.1 cm) Weight: 245 lb 13 oz (111.5 kg) IBW/kg (Calculated) : 57  Vital Signs: Temp: 97.8 F (36.6 C) (05/17 0808) Temp Source: Oral (05/17 0808) BP: 148/51 mmHg (05/17 1004) Pulse Rate: 59 (05/17 1039)  Labs:  Recent Labs  10/09/15 0335  10/09/15 2231 10/10/15 0212 10/11/15 0322 10/11/15 0330  HGB 8.2*  --   --  8.1*  --  8.1*  HCT 26.6*  --   --  26.2*  --  26.5*  PLT 286  --   --  288  --  294  HEPARINUNFRC 0.76*  < > 0.64 0.45  --  0.48  CREATININE 3.84*  --   --  5.32* 6.47*  --   CKTOTAL  --   --   --   --  30*  --   < > = values in this interval not displayed.  Estimated Creatinine Clearance: 10.1 mL/min (by C-G formula based on Cr of 6.47).   Assessment: Heparin gtt for AFib. Level remains therapeutic. hgb 8.1, plts wnl.   Goal of Therapy:  Heparin level 0.3-0.7 units/ml Monitor platelets by anticoagulation protocol: Yes   Plan:  -Continue heparin at 1600 units/hr -Daily HL, CBC -F/u plan for PO anticoagulation   Joya San, PharmD Clinical Pharmacy Resident Pager # [phone] 10/11/2015 11:02 AM

## 2015-10-11 NOTE — Care Management Important Message (Signed)
Important Message  Patient Details  Name: Briana [name] MRN: [id number] Date of Birth: 01/09/1946   Medicare Important Message Given:  Yes    Lacretia Leigh, RN 10/11/2015, 10:22 AM

## 2015-10-11 NOTE — Progress Notes (Signed)
Physical Therapy Treatment Patient Details Name: Briana [name] MRN: [id number] DOB: 18-Jul-1945 Today's Date: 10/11/2015    History of Present Illness Pt adm with acute hypoxic Respiratory Failure in setting of bilateral pulmonary infiltrates. Favor acute Pulmonary Edema over PNA. Pt placed on bipap. PMH - DM, CAD, diastolic HF,     PT Comments    Pt ambulated a total of 85' in 2 bouts today with min A (+2 for lines and chair close behind) and use of RW. Pt with LE fatigue end of each bout.  VSS throughout ambulation. PT will continue to follow.   Follow Up Recommendations  CIR     Equipment Recommendations  Other (comment)    Recommendations for Other Services       Precautions / Restrictions Precautions Precautions: Fall Precaution Comments: monitor HR an O2 sats Restrictions Weight Bearing Restrictions: No    Mobility  Bed Mobility               General bed mobility comments: Pt sitting in chair upon arrival  Transfers Overall transfer level: Needs assistance Equipment used: Rolling walker (2 wheeled) Transfers: Sit to/from Stand Sit to Stand: Supervision         General transfer comment: pt stood 2x from recliner safely with supervision, appropriate use of arm rests and no physical assistance needed  Ambulation/Gait Ambulation/Gait assistance: Min guard Ambulation Distance (Feet): 85 Feet (40', 45') Assistive device: Rolling walker (2 wheeled) Gait Pattern/deviations: Step-through pattern;Trunk flexed Gait velocity: decreased Gait velocity interpretation: <1.8 ft/sec, indicative of risk for recurrent falls General Gait Details: cv's for erect posture and forward gaze. O2 sats stable 89-92% on 2L O2. HR maintained in 60's. Pt's legs began to tremble at end of each bout of ambulation   Stairs            Wheelchair Mobility    Modified Rankin (Stroke Patients Only)       Balance Overall balance assessment: Needs  assistance Sitting-balance support: No upper extremity supported Sitting balance-Leahy Scale: Good     Standing balance support: No upper extremity supported Standing balance-Leahy Scale: Fair Standing balance comment: can let go of RW in static standing but requires support for ambulation                    Cognition Arousal/Alertness: Awake/alert Behavior During Therapy: WFL for tasks assessed/performed Overall Cognitive Status: Within Functional Limits for tasks assessed                      Exercises      General Comments        Pertinent Vitals/Pain Pain Assessment: No/denies pain    Home Living                      Prior Function            PT Goals (current goals can now be found in the care plan section) Acute Rehab PT Goals Patient Stated Goal: home as soon as I can PT Goal Formulation: With patient Time For Goal Achievement: 10/20/15 Potential to Achieve Goals: Good Progress towards PT goals: Progressing toward goals    Frequency  Min 3X/week    PT Plan Current plan remains appropriate    Co-evaluation             End of Session Equipment Utilized During Treatment: Gait belt;Oxygen Activity Tolerance: Patient tolerated treatment well;Patient limited by fatigue Patient left: in chair;with  call bell/phone within reach;with family/visitor present     Time: 0912-0945 PT Time Calculation (min) (ACUTE ONLY): 33 min  Charges:  $Gait Training: 23-37 mins                    G Codes:     Leighton Roach, [place]  [place], [place] 10/11/2015, 11:12 AM

## 2015-10-12 ENCOUNTER — Inpatient Hospital Stay (HOSPITAL_COMMUNITY): Payer: Medicare Other

## 2015-10-12 DIAGNOSIS — E1165 Type 2 diabetes mellitus with hyperglycemia: Secondary | ICD-10-CM

## 2015-10-12 DIAGNOSIS — R5381 Other malaise: Secondary | ICD-10-CM

## 2015-10-12 DIAGNOSIS — E118 Type 2 diabetes mellitus with unspecified complications: Secondary | ICD-10-CM

## 2015-10-12 DIAGNOSIS — N179 Acute kidney failure, unspecified: Secondary | ICD-10-CM

## 2015-10-12 LAB — GLUCOSE, CAPILLARY
GLUCOSE-CAPILLARY: 136 mg/dL — AB (ref 65–99)
Glucose-Capillary: 110 mg/dL — ABNORMAL HIGH (ref 65–99)
Glucose-Capillary: 212 mg/dL — ABNORMAL HIGH (ref 65–99)

## 2015-10-12 LAB — RENAL FUNCTION PANEL
Albumin: 2.5 g/dL — ABNORMAL LOW (ref 3.5–5.0)
Anion gap: 17 — ABNORMAL HIGH (ref 5–15)
BUN: 118 mg/dL — ABNORMAL HIGH (ref 6–20)
CHLORIDE: 99 mmol/L — AB (ref 101–111)
CO2: 17 mmol/L — AB (ref 22–32)
CREATININE: 7.31 mg/dL — AB (ref 0.44–1.00)
Calcium: 8.5 mg/dL — ABNORMAL LOW (ref 8.9–10.3)
GFR calc non Af Amer: 5 mL/min — ABNORMAL LOW (ref >60)
GFR, EST AFRICAN AMERICAN: 6 mL/min — AB (ref >60)
GLUCOSE: 97 mg/dL (ref 65–99)
Phosphorus: 9 mg/dL — ABNORMAL HIGH (ref 2.5–4.6)
Potassium: 4.6 mmol/L (ref 3.5–5.1)
Sodium: 133 mmol/L — ABNORMAL LOW (ref 135–145)

## 2015-10-12 LAB — CBC
HCT: 26.1 % — ABNORMAL LOW (ref 36.0–46.0)
Hemoglobin: 8.1 g/dL — ABNORMAL LOW (ref 12.0–15.0)
MCH: 26.2 pg (ref 26.0–34.0)
MCHC: 31 g/dL (ref 30.0–36.0)
MCV: 84.5 fL (ref 78.0–100.0)
PLATELETS: 304 10*3/uL (ref 150–400)
RBC: 3.09 MIL/uL — AB (ref 3.87–5.11)
RDW: 15.6 % — ABNORMAL HIGH (ref 11.5–15.5)
WBC: 11.6 10*3/uL — ABNORMAL HIGH (ref 4.0–10.5)

## 2015-10-12 LAB — HEPARIN LEVEL (UNFRACTIONATED): HEPARIN UNFRACTIONATED: 0.51 [IU]/mL (ref 0.30–0.70)

## 2015-10-12 MED ORDER — METOPROLOL TARTRATE 50 MG PO TABS
50.0000 mg | ORAL_TABLET | Freq: Two times a day (BID) | ORAL | Status: DC
  Administered 2015-10-12 (×2): 50 mg via ORAL
  Filled 2015-10-12 (×2): qty 1

## 2015-10-12 MED ORDER — ALTEPLASE 2 MG IJ SOLR: 2.0000 mg | Freq: Once | INTRAMUSCULAR | Status: DC | PRN

## 2015-10-12 MED ORDER — METOPROLOL TARTRATE 50 MG PO TABS: 50.0000 mg | ORAL_TABLET | Freq: Two times a day (BID) | ORAL | Status: DC

## 2015-10-12 MED ORDER — SODIUM CHLORIDE 0.9 % IV SOLN: 100.0000 mL | INTRAVENOUS | Status: DC | PRN

## 2015-10-12 MED ORDER — HEPARIN SODIUM (PORCINE) 1000 UNIT/ML DIALYSIS
1000.0000 [IU] | INTRAMUSCULAR | Status: DC | PRN
  Filled 2015-10-12: qty 1

## 2015-10-12 NOTE — Procedures (Signed)
Patient was seen on dialysis and the procedure was supervised.  BFR 250  Via cath  BP is  166/53.   Patient appears to be tolerating treatment well  Briana [name] A 10/12/2015

## 2015-10-12 NOTE — Consult Note (Addendum)
Physical Medicine and Rehabilitation Consult Reason for Consult: Debilitation related to acute hypoxic respiratory failure/multi medical Referring Physician: [facility]    HPI: Briana [name] is a 70 y.o. right handed female with history of chronic renal insufficiency 1.6-2.3, diabetes mellitus, CAD status post CABG, diastolic congestive heart failure. Patient lives with spouse who has dementia. One level home 3 steps to entry. Independent with assistive device prior to admission. Presented 10/01/2015 to [facility] hospital with chest pain. In the ED found to have hypoxia, fever 101.6, WBC 15,500. EKG lateral ST depression with mild elevated troponin 0.12. BNP 894. Chest x-ray showed alveolar opacities most dense in the right base. Cultures were obtained. Patient transferred to [facility] for ongoing care. Placed on broad-spectrum antibiotics. Cardiology follow-up for chest pain. Echocardiogram with ejection fraction [id number] grade 3 diastolic dysfunction. Histology function was normal. Myocardial imaging VQ scan concern for mild peri-infarct ischemia involving the anterior wall and anterior septal wall. Evidence for infarcts involving the anterior and lateral walls. Patient maintained on aspirin as well as heparin therapy. A. fib with RVR status post DCCV 10/08/2013. Renal service is consulted 10/10/2015 for elevated creatinine from baseline 1.6-2.3-5.32. Renal ultrasound showed no hydronephrosis. Hemodialysis catheter inserted 10/11/2015 and await plan for possible need for hemodialysis. Physical and occupational therapy evaluations completed with recommendations of physical medicine rehabilitation consult.   Review of Systems  Constitutional: Positive for fever. Negative for chills.  HENT: Negative for hearing loss.   Eyes: Negative for blurred vision and double vision.  Cardiovascular: Positive for chest pain, palpitations and leg swelling.  Gastrointestinal: Positive for constipation.  Negative for nausea and vomiting.  Genitourinary: Negative for dysuria and hematuria.  Musculoskeletal: Positive for myalgias and joint pain.  Skin: Negative for rash.  Neurological: Negative for seizures and headaches.  Psychiatric/Behavioral: Positive for depression.       Anxiety  All other systems reviewed and are negative.  Past Medical History  Diagnosis Date  . Diabetes mellitus   . Hypertension   . Hyperlipidemia   . Heart murmur   . Peripheral vascular disease ([place])   . Arthritis   . Depression with anxiety   . Carotid artery occlusion   . Chronic kidney disease   . Neuropathy ([place])   . CAD (coronary artery disease)   . Rheumatic fever   . Sleep apnea   . PAD (peripheral artery disease) ([place])   . Adenomatous colon polyp 09/2007  . Atrial fibrillation with rapid ventricular response ([place]) 10/05/2015   Past Surgical History  Procedure Laterality Date  . Coronary artery bypass graft  2005  . Carotid endarterectomy  10/04/2009    Right CEA  . Tubal ligation    . Polypectomy  02/25/2012    Procedure: POLYPECTOMY;  Surgeon: Danie Binder, MD;  Location: [facility];  Service: Endoscopy;  Laterality: N/A;   Family History  Problem Relation Age of Onset  . Breast cancer Mother   . Heart disease Father   . Diabetes Father   . Heart disease Brother    Social History:  reports that she has never smoked. She has never used smokeless tobacco. She reports that she does not drink alcohol or use illicit drugs. Allergies:  Allergies  Allergen Reactions  . Carbamazepine Other (See Comments)    Reaction to tegretol - loopy  . Clonazepam Other (See Comments)    Unknown reaction  . Gemfibrozil Other (See Comments)    Patient is not aware of this  allergy but states that she has side effects to a cholesterol medication in the past  . Macrobid [Nitrofurantoin] Nausea And Vomiting  . Oxycodone Other (See Comments)    hallucinations   Medications Prior to Admission  Medication Sig  Dispense Refill  . aspirin EC 325 MG tablet Take 325 mg by mouth daily.    Marland Kitchen atorvastatin (LIPITOR) 80 MG tablet Take 80 mg by mouth daily.     . cetirizine (ZYRTEC) 10 MG tablet Take 10 mg by mouth at bedtime.    . cholecalciferol (VITAMIN D) 1000 UNITS tablet Take 1,000 Units by mouth daily.     . diazepam (VALIUM) 5 MG tablet Take 5 mg by mouth See admin instructions. Take 1 tablet (5 mg) by mouth daily at bedtime, may also take one tablet during the day as needed for anxiety    . furosemide (LASIX) 40 MG tablet Take 40 mg by mouth daily.     . hydrALAZINE (APRESOLINE) 100 MG tablet Take 1 tablet (100 mg total) by mouth 3 (three) times daily. 90 tablet 3  . HYDROcodone-acetaminophen (NORCO) 7.5-325 MG tablet Take 1 tablet by mouth every 4 (four) hours as needed for moderate pain (Must last 30 days.  Do not drive or operate machinery while taking this medicine.). 120 tablet 0  . insulin NPH-regular Human (NOVOLIN 70/30) (70-30) 100 UNIT/ML injection Inject 30 Units into the skin 2 (two) times daily with a meal. 10 mL 2  . JANUVIA 25 MG tablet Take 1 tablet (25 mg total) by mouth daily. 30 tablet 2  . metoprolol tartrate (LOPRESSOR) 25 MG tablet Take 1 tablet (25 mg total) by mouth 2 (two) times daily. 180 tablet 3  . potassium chloride SA (K-DUR,KLOR-CON) 20 MEQ tablet Take 20 mEq by mouth daily.     . sertraline (ZOLOFT) 100 MG tablet Take 200 mg by mouth at bedtime.     Marland Kitchen tiZANidine (ZANAFLEX) 4 MG capsule Take 4 mg by mouth 3 (three) times daily as needed for muscle spasms.       Home: Home Living Family/patient expects to be discharged to:: Inpatient rehab Living Arrangements: Spouse/significant other Available Help at Discharge: Family, Available PRN/intermittently Type of Home: House Home Access: Stairs to enter [employer] of Steps: 3 Entrance Stairs-Rails: Left Home Layout: One level Bathroom Shower/Tub: Gaffer, Theatre stage manager Toilet: Handicapped  height Home Equipment: Bedside commode, Shower seat, Grab bars - tub/shower, Hand held shower head, Cane - single point, [employer] - quad  Functional History: Prior Function Level of Independence: Independent with assistive device(s) Comments: Used cane at times for extended distance Functional Status:  Mobility: Bed Mobility General bed mobility comments: Pt sitting in chair upon arrival Transfers Overall transfer level: Needs assistance Equipment used: Rolling walker (2 wheeled) Transfers: Sit to/from Stand, [employer] Transfers Sit to Stand: Supervision Stand pivot transfers: Min guard General transfer comment: pt stood 2x from recliner safely with supervision, appropriate use of arm rests and no physical assistance needed Ambulation/Gait Ambulation/Gait assistance: Min guard Ambulation Distance (Feet): 85 Feet (40', 45') Assistive device: Rolling walker (2 wheeled) Gait Pattern/deviations: Step-through pattern, Trunk flexed General Gait Details: cv's for erect posture and forward gaze. O2 sats stable 89-92% on 2L O2. HR maintained in 60's. Pt's legs began to tremble at end of each bout of ambulation Gait velocity: decreased Gait velocity interpretation: <1.8 ft/sec, indicative of risk for recurrent falls    ADL: ADL Overall ADL's : Needs assistance/impaired Eating/Feeding: Independent, Sitting Grooming: Set up,  Sitting Upper Body Bathing: Set up, Sitting Lower Body Bathing: Set up, Supervison/ safety (min guard A sit<>stand) Upper Body Dressing : Set up, Sitting Lower Body Dressing: Maximal assistance, Sit to/from stand Lower Body Dressing Details (indicate cue type and reason): Pt unable to access bil. feet for LB ADLs  Toilet Transfer: Min guard, RW, BSC, Stand-pivot Toileting- Water quality scientist and Hygiene: Min guard, Sit to/from stand Functional mobility during ADLs: Min guard, Rolling walker General ADL Comments: Pt is very motivated   Cognition: Cognition Overall  Cognitive Status: Within Functional Limits for tasks assessed Orientation Level: Oriented X4 Cognition Arousal/Alertness: Awake/alert Behavior During Therapy: WFL for tasks assessed/performed Overall Cognitive Status: Within Functional Limits for tasks assessed  Blood pressure 170/64, pulse 54, temperature 97.8 F (36.6 C), temperature source Oral, resp. rate 25, height 5\' 5"  (1.651 m), weight 111.5 kg (245 lb 13 oz), SpO2 96 %. Physical Exam  Constitutional: She is oriented to person, place, and time.  HENT:  Head: Normocephalic.  Eyes: EOM are normal.  Neck: Neck supple. No thyromegaly present.  Cardiovascular:  Cardiac rate controlled  Respiratory: Effort normal and breath sounds normal. No respiratory distress.  GI: Soft. Bowel sounds are normal. She exhibits no distension.  Musculoskeletal:  LUE 2+ edema BLE: 1+ edema  Neurological: She is alert and oriented to person, place, and time.  UE: 4/5 prox to distal LE: 2/5 HF, 3/5 KE and ADF/PF  Decreased LT/PP below mid calves to feet  Skin: Skin is warm and dry.  Psychiatric: She has a normal mood and affect. Her behavior is normal.    Results for orders placed or performed during the hospital encounter of 09/30/15 (from the past 24 hour(s))  Glucose, capillary     Status: Abnormal   Collection Time: 10/11/15 11:48 AM  Result Value Ref Range   Glucose-Capillary 187 (H) 65 - 99 mg/dL   Comment 1 Capillary Specimen   Glucose, capillary     Status: Abnormal   Collection Time: 10/11/15  4:23 PM  Result Value Ref Range   Glucose-Capillary 142 (H) 65 - 99 mg/dL   Comment 1 Capillary Specimen   Glucose, capillary     Status: None   Collection Time: 10/11/15  9:02 PM  Result Value Ref Range   Glucose-Capillary 74 65 - 99 mg/dL   Comment 1 Capillary Specimen   Heparin level (unfractionated)     Status: None   Collection Time: 10/12/15  4:55 AM  Result Value Ref Range   Heparin Unfractionated 0.51 0.30 - 0.70 IU/mL  Renal  function panel     Status: Abnormal   Collection Time: 10/12/15  4:55 AM  Result Value Ref Range   Sodium 133 (L) 135 - 145 mmol/L   Potassium 4.6 3.5 - 5.1 mmol/L   Chloride 99 (L) 101 - 111 mmol/L   CO2 17 (L) 22 - 32 mmol/L   Glucose, Bld 97 65 - 99 mg/dL   BUN 118 (H) 6 - 20 mg/dL   Creatinine, Ser 7.31 (H) 0.44 - 1.00 mg/dL   Calcium 8.5 (L) 8.9 - 10.3 mg/dL   Phosphorus 9.0 (H) 2.5 - 4.6 mg/dL   Albumin 2.5 (L) 3.5 - 5.0 g/dL   GFR calc non Af Amer 5 (L) >60 mL/min   GFR calc Af Amer 6 (L) >60 mL/min   Anion gap 17 (H) 5 - 15  CBC     Status: Abnormal   Collection Time: 10/12/15  4:55 AM  Result Value Ref  Range   WBC 11.6 (H) 4.0 - 10.5 K/uL   RBC 3.09 (L) 3.87 - 5.11 MIL/uL   Hemoglobin 8.1 (L) 12.0 - 15.0 g/dL   HCT 26.1 (L) 36.0 - 46.0 %   MCV 84.5 78.0 - 100.0 fL   MCH 26.2 26.0 - 34.0 pg   MCHC 31.0 30.0 - 36.0 g/dL   RDW 15.6 (H) 11.5 - 15.5 %   Platelets 304 150 - 400 K/uL  Glucose, capillary     Status: Abnormal   Collection Time: 10/12/15  7:53 AM  Result Value Ref Range   Glucose-Capillary 110 (H) 65 - 99 mg/dL   Comment 1 Capillary Specimen    Nm Myocar Multi W/spect W/wall Motion / Ef  10/10/2015  CLINICAL DATA:  70 year old with chest pain. EXAM: MYOCARDIAL IMAGING WITH SPECT (REST AND PHARMACOLOGIC-STRESS) GATED LEFT VENTRICULAR WALL MOTION STUDY LEFT VENTRICULAR EJECTION FRACTION TECHNIQUE: Standard myocardial SPECT imaging was performed after resting intravenous injection of 10 mCi Tc-69m tetrofosmin. Subsequently, intravenous infusion of Lexiscan was performed under the supervision of the Cardiology staff. At peak effect of the drug, 30 mCi Tc-74m tetrofosmin was injected intravenously and standard myocardial SPECT imaging was performed. Quantitative gated imaging was also performed to evaluate left ventricular wall motion, and estimate left ventricular ejection fraction. COMPARISON:  Chest radiograph 10/05/2015 FINDINGS: Perfusion: There are fixed defects  involving the anterior, anteroseptal and lateral walls. These findings are compatible with areas of infarct. In addition, there is concern for mild reversibility in the mid segments of the anteroseptal wall and anterior wall. These areas are concerning for peri-infarct ischemia. Wall Motion: Dyskinesia at the apex. Left Ventricular Ejection Fraction: 45 % End diastolic volume [id number] ml End systolic volume 68 ml IMPRESSION: 1. Concern for mild peri-infarct ischemia involving the anterior wall and anteroseptal wall. Evidence for infarcts involving the anterior and lateral walls. 2. Dyskinesia at the apex. 3. Left ventricular ejection fraction is 45%. 4. Non invasive risk stratification*: Intermediate *2012 Appropriate Use Criteria for Coronary Revascularization Focused Update: J Am Coll Cardiol. [id number]. http://content.airportbarriers.com.aspx?articleid=[email or web address] Electronically Signed   By: Markus Daft M.D.   On: 10/10/2015 17:08   Dg Chest Port 1 View  10/11/2015  CLINICAL DATA:  Central line placement. EXAM: PORTABLE CHEST 1 VIEW COMPARISON:  10/05/2015. FINDINGS: 1819 hours. Right IJ central venous catheter has been placed, extending to the mid SVC level. The heart size and mediastinal contours are stable status post CABG. Pulmonary edema appears mildly improved. No pneumothorax or significant pleural effusion identified. IMPRESSION: Central line extends to the mid SVC level. No evidence of pneumothorax. Mild improvement in pulmonary edema. Electronically Signed   By: Richardean Sale M.D.   On: 10/11/2015 18:31    Assessment/Plan: Diagnosis: debility related to MI/Afib/CHF and multiple medical 1. Does the need for close, 24 hr/day medical supervision in concert with the patient's rehab needs make it unreasonable for this patient to be served in a less intensive setting? Yes 2. Co-Morbidities requiring supervision/potential complications: CAD/renal failure/DM2 with peripheral neuropathy 3. Due to  bladder management, bowel management, safety, skin/wound care, disease management, medication administration, pain management and patient education, does the patient require 24 hr/day rehab nursing? Yes 4. Does the patient require coordinated care of a physician, rehab nurse, PT (1-2 hrs/day, 5 days/week) and OT (1-2 hrs/day, 5 days/week) to address physical and functional deficits in the context of the above medical diagnosis(es)? Yes Addressing deficits in the following areas: balance, endurance, locomotion, strength, transferring, bowel/bladder control, bathing, dressing, feeding,  grooming and toileting 5. Can the patient actively participate in an intensive therapy program of at least 3 hrs of therapy per day at least 5 days per week? Potentially 6. The potential for patient to make measurable gains while on inpatient rehab is good 7. Anticipated functional outcomes upon discharge from inpatient rehab are modified independent  with PT, modified independent with OT, n/a with SLP. 8. Estimated rehab length of stay to reach the above functional goals is: potentially 7-9 days 9. Does the patient have adequate social supports and living environment to accommodate these discharge functional goals? Yes and Potentially 10. Anticipated D/C setting: Home 11. Anticipated post D/C treatments: HH therapy and Outpatient therapy 12. Overall Rehab/Functional Prognosis: excellent  RECOMMENDATIONS: This patient's condition is appropriate for continued rehabilitative care in the following setting: CIR Patient has agreed to participate in recommended program. Yes and Potentially Note that insurance prior authorization may be required for reimbursement for recommended care.  Comment: Will follow along as medical issues are addressed.   Meredith Staggers, MD, [place] [facility] 10/12/2015     10/12/2015

## 2015-10-12 NOTE — Progress Notes (Signed)
[facility] for Heparin  Indication: afib  Allergies  Allergen Reactions  . Carbamazepine Other (See Comments)    Reaction to tegretol - loopy  . Clonazepam Other (See Comments)    Unknown reaction  . Gemfibrozil Other (See Comments)    Patient is not aware of this allergy but states that she has side effects to a cholesterol medication in the past  . Macrobid [Nitrofurantoin] Nausea And Vomiting  . Oxycodone Other (See Comments)    hallucinations    Patient Measurements: Height: 5\' 5"  (165.1 cm) Weight: 245 lb 13 oz (111.5 kg) IBW/kg (Calculated) : 57  Vital Signs: Temp: 97.8 F (36.6 C) (05/18 0754) Temp Source: Oral (05/18 0754) BP: 170/64 mmHg (05/18 0754) Pulse Rate: 54 (05/18 0800)  Labs:  Recent Labs  10/10/15 0212 10/11/15 0322 10/11/15 0330 10/12/15 0455  HGB 8.1*  --  8.1* 8.1*  HCT 26.2*  --  26.5* 26.1*  PLT 288  --  294 304  HEPARINUNFRC 0.45  --  0.48 0.51  CREATININE 5.32* 6.47*  --  7.31*  CKTOTAL  --  30*  --   --     Estimated Creatinine Clearance: 8.9 mL/min (by C-G formula based on Cr of 7.31).   Assessment: Heparin gtt for new onset AFib. Level remains therapeutic. hgb 8.1, plts wnl.   Goal of Therapy:  Heparin level 0.3-0.7 units/ml Monitor platelets by anticoagulation protocol: Yes   Plan:  -Continue heparin at 1600 units/hr -Daily HL, CBC -F/u plan for PO anticoagulation   Joya San, PharmD Clinical Pharmacy Resident Pager # [phone] 10/12/2015 10:00 AM

## 2015-10-12 NOTE — Progress Notes (Addendum)
[place] TEAM 1 - Stepdown/ICU TEAM Progress Note  Briana [name] [id number] DOB: 1945-12-22 DOA: 09/30/2015 PCP: Antionette Fairy, PA-C  Admit HPI / Brief Narrative: Ms. Newsum is an 70 y.o. WF PMHx Depression with AnxietyDM Type 2 with complication (neuropathy), CAD native artery, PVD, Chronic Diastolic CHF, Heart murmur, OSA, HTN, HLD,Adenomatous colon polyp, CKD   Who presented to the ED at [facility] on the evening of 5/6 with 24 hours of chest pain. In ED found to have: hypoxia, a fever to 101.6, WBC of 15.5, EKG changes (lateral ST depression), mild troponin elevation (0.12). BNP was elevated to 894.0, previously was 391. She had a CXR which showed alveolar opacities, most dense in the Rt base. Cultures were obtained, she was started on CAP coverage, and due to the hypoxia was transferred to [facility] for tx of pneumonia.    HPI/Subjective: 5/18  A/O 4, negative CP, decreased SOB. Sitting in chair. Base weight is 102 kg (225 pounds). Does not weigh herself daily. States prior to admission no previous episode of A. fib with RVR. Understand she will require HD  Assessment/Plan: Acute hypoxic Respiratory Failure in setting of bilateral pulmonary infiltrates. PNA vs Pulmonary Edema -continue current antibiotics 5 days  R/o PE; although think we have fairly good explanation for her hypoxia  -LE dopplers neg -Obtain VQ scan once A. fib RVR controlled  Possible CAP -Complete 5 day course antibiotics  CAD native artery  Acute on chronic Diastolic CHF/Pulmonary Hypertension -Hold all anticoagulants secondary to possible GI bleed; -Strict in and out since admission -1.9 L -Daily weight Filed Weights   10/09/15 0500 10/11/15 0400 10/12/15 0500  Weight: 106.9 kg (235 lb 10.8 oz) 111.5 kg (245 lb 13 oz) 111.5 kg (245 lb 13 oz)  -Transfuse for hemoglobin<8 -5/9 Transfuse 1 unit PRBC  A-Fib RVR/RBBB(CHA2DS2 VASc score is 6+) - EKG shows A. fib RVR with RBBB -s/p DCCV 5/15    -See CHF - Cards following   HTN emergency w/ associated pulmonary edema  -Amiodarone 200 mg BID -Amlodipine 5 mg daily -Metoprolol 100 mg BID   Acute on CKD (baseline Cr~1.6 -2 ) -Renal US; nondiagnostic for increased failure Lab Results  Component Value Date   CREATININE 7.31* 10/12/2015   CREATININE 6.47* 10/11/2015   CREATININE 5.32* 10/10/2015  -5/18 first session HD Per nephrology   Acute blood loss anemia? -Hemoglobin stable  -hemoccult negative  Recent Labs     10/10/15  0212  10/11/15  0330  10/12/15  0455  HGB  8.1*  8.1*  8.1*   Leukocytosis -Resolved   DM Type 2 controlled with complication -123456 hemoglobin A1c= 6.9  -NovoLog 70/30  15 units BID -Resistant SSI  Depression -Zoloft 100 mg daily   Deconditioning -PT/OT, evaluate patient with new onset A. fib RVR with deconditioning for CIR vs SNF   Code Status: FULL Family Communication:  Disposition Plan: Resolution of A. fib RVR    Consultants: Dr.Rakesh Elsworth [facility]  Dr. Glenetta [facility]    Procedure/Significant Events: CXR -- alveolar filling pattern, most dense in RLL. EKG - lateral ST depression, TWI throughout 5/7 Bilat LE Doppler; Negative DVT/SVT 5/8 echocardiogram;Left ventricle: mild focal basal hypertrophy of the septum. 55% to 60%. -(grade 3 diastolic dysfunction).-Left atrium: severely dilated. - Tricuspid valve: moderate regurgitation. - Pulmonary arteries: PA peak pressure: 69 mm Hg (S). 5/9 Transfuse 1 unit PRBC 5/11 renal ultrasound;-Low normal renal cortical thickness, a finding that may be  seen with medical renal disease. . 5/12 RVR persists  5/15 TEE w/ DCCV > sinus bradycardia   Culture Blood 5/6 left arm/right hand NGTD Urine 5/6 negative final 5/7 MRSA by PCR negative  Antibiotics: Azithromycin 5/6 >>5/10 Ceftriaxone 5/6 >> 5/11  DVT prophylaxis: Heparin drip  Devices    LINES / TUBES:     Continuous  Infusions: . sodium chloride 10 mL/hr at 10/07/15 2214  . sodium chloride    . sodium chloride    . heparin 1,600 Units/hr (10/12/15 0208)    Objective: VITAL SIGNS: Temp: 97.8 F (36.6 C) (05/18 0754) Temp Source: Oral (05/18 0754) BP: 170/64 mmHg (05/18 0754) Pulse Rate: 55 (05/18 0754) SPO2; FIO2:   Intake/Output Summary (Last 24 hours) at 10/12/15 [id number] Last data filed at 10/12/15 0700  Gross per 24 hour  Intake    904 ml  Output    270 ml  Net    634 ml     Exam: General: A/O 4, positive acute respiratory distress Eyes: negative scleral hemorrhage, negative icterus ENT: Negative Runny nose, negative gingival bleeding, Neck:  Negative scars, masses, torticollis, lymphadenopathy, JVD Lungs: diminished breath sounds, Positive crackles bilateral diffusely   Cardiac; regular rhythm and rate, negative murmurs rubs or gallops, normal S1/S2 Abdomen: Morbidly Obese, negative abdominal pain, nondistended, positive soft, bowel sounds, no rebound, no ascites, no appreciable mass Extremities: No significant cyanosis, clubbing, positive bilateral lower extremity edema to hips 2- 3+ Psychiatric:  Negative depression, negative anxiety, negative fatigue, negative mania  Neurologic:  Cranial nerves II through XII intact, tongue/uvula midline, all extremities muscle strength 5/5, sensation intact throughout, negative dysarthria, negative expressive aphasia, negative receptive aphasia.    Data Reviewed: Basic Metabolic Panel:  Recent Labs Lab 10/06/15 0253  10/08/15 0228 10/09/15 0335 10/10/15 0212 10/11/15 0322 10/12/15 0455  NA 137  < > 137 135 135 135 133*  K 4.2  < > 4.4 4.3 4.7 4.6 4.6  CL 105  < > 104 104 102 101 99*  CO2 19*  < > 19* 17* 18* 17* 17*  GLUCOSE 132*  < > 179* 140* 163* 110* 97  BUN 77*  < > 85* 90* 101* 111* 118*  CREATININE 2.59*  < > 3.23* 3.84* 5.32* 6.47* 7.31*  CALCIUM 8.2*  < > 8.1* 8.0* 8.3* 8.6* 8.5*  MG 2.2  --   --   --   --   --   --    PHOS 5.1*  --  5.8* 6.8* 8.4* 8.5* 9.0*  < > = values in this interval not displayed. Liver Function Tests:  Recent Labs Lab 10/07/15 0312 10/08/15 0228 10/09/15 0335 10/10/15 0212 10/11/15 0322 10/12/15 0455  AST 30  --   --   --   --   --   ALT 29  --   --   --   --   --   ALKPHOS 111  --   --   --   --   --   BILITOT 0.3  --   --   --   --   --   PROT 6.1*  --   --   --   --   --   ALBUMIN 2.4* 2.4* 2.6* 2.6* 2.6* 2.5*   No results for input(s): LIPASE, AMYLASE in the last 168 hours. No results for input(s): AMMONIA in the last 168 hours. CBC:  Recent Labs Lab 10/08/15 0228 10/09/15 0335 10/10/15 0212 10/11/15 0330 10/12/15 0455  WBC  11.1* 11.3* 11.4* 11.8* 11.6*  HGB 8.2* 8.2* 8.1* 8.1* 8.1*  HCT 26.8* 26.6* 26.2* 26.5* 26.1*  MCV 87.3 86.4 85.9 85.5 84.5  PLT 261 286 288 294 304   Cardiac Enzymes:  Recent Labs Lab 10/11/15 0322  CKTOTAL 30*   BNP (last 3 results)  Recent Labs  05/31/15 1522 09/30/15 2140 10/02/15 2204  BNP 391.0* 894.0* 1262.5*    ProBNP (last 3 results) No results for input(s): PROBNP in the last 8760 hours.  CBG:  Recent Labs Lab 10/11/15 0810 10/11/15 1148 10/11/15 1623 10/11/15 2102 10/12/15 0753  GLUCAP 114* 187* 142* 74 110*    No results found for this or any previous visit (from the past 240 hour(s)).   Studies:  Recent x-ray studies have been reviewed in detail by the Attending Physician  Scheduled Meds:  Scheduled Meds: . amiodarone  200 mg Oral BID  . amLODipine  5 mg Oral Daily  . aspirin EC  81 mg Oral Daily  . atorvastatin  80 mg Oral QPM  . calcium acetate  1,334 mg Oral TID WC  . insulin aspart  0-20 Units Subcutaneous TID WC  . insulin aspart  0-5 Units Subcutaneous QHS  . insulin aspart protamine- aspart  15 Units Subcutaneous BID WC  . isosorbide-hydrALAZINE  1 tablet Oral TID  . loratadine  10 mg Oral Daily  . metoprolol tartrate  50 mg Oral BID  . sertraline  100 mg Oral Daily  .  sodium bicarbonate  650 mg Oral BID  . sodium chloride flush  10-40 mL Intracatheter Q12H  . sodium chloride flush  3 mL Intravenous Q12H    Time spent on care of this patient: 40 mins   Chayce Rullo, Geraldo Docker , MD  [facility] Office  [phone] Pager - [phone]  On-Call/Text Page:      [email or web address]      password [email or web address]  If 7PM-7AM, please contact night-coverage [email or web address] Password [email or web address] 10/12/2015, 8:33 AM   LOS: 11 days   Care during the described time interval was provided by me .  I have reviewed this patient's available data, including medical history, events of note, physical examination, and all test results as part of my evaluation. I have personally reviewed and interpreted all radiology studies.   Dia Crawford, MD [phone] Pager

## 2015-10-12 NOTE — Progress Notes (Signed)
*  PRELIMINARY RESULTS* Vascular Ultrasound Renal Artery Duplex has been completed.  Preliminary findings: Technically difficult exam due to patient body habitus. No obvious evidence of renal artery stenosis or occlusion. Atypical high- resistant waveforms noted. Abnormal intrarenal resistive indices.    Landry Mellow, RDMS, RVT   10/12/2015, 10:09 AM

## 2015-10-12 NOTE — Progress Notes (Signed)
HISTORY: Briana [name] is a 70 yo female with PMHx of CAD s/p CABG  In 2005, carotid artery stenosis, chronic diastolic CHF, OSA, HTN, HLD, and CKD who presented to [facility] ED on 5/6 with complaint of 24 hours of chest pain. Patient was found to be in acute respiratory failure with hypoxia likely secondary to CAP. Cardiology was consulted for chest pain on 5/7. EKG showed lateral ST depression with a mild troponin elevation at 0.12. Patient developed atrial fibrillation with RVR on 5/9 and is s/p successful cardioversion to sinus bradycardia on 5/15.  SUBJECTIVE:  Patient was seen and examined this morning. Patient admits to orthopnea, but appears to be comfortable laying in bed. She denies recurrent chest discomfort or nausea. She does admits to chills since her HD catheter placement yesterday.   OBJECTIVE:   Vitals:   Filed Vitals:   10/12/15 0500 10/12/15 0502 10/12/15 0754 10/12/15 0800  BP:  154/61 170/64   Pulse:  52 55 54  Temp:   97.8 F (36.6 C)   TempSrc:   Oral   Resp:  17 24 25   Height:      Weight: 245 lb 13 oz (111.5 kg)     SpO2:  93% 95% 96%   I&O's:    Intake/Output Summary (Last 24 hours) at 10/12/15 1010 Last data filed at 10/12/15 0800  Gross per 24 hour  Intake    288 ml  Output    245 ml  Net     43 ml   TELEMETRY:   ECHO 5/8: Impressions: - The right ventricular systolic pressure was increased consistent  with severe pulmonary hypertension. PA peak pressure 69.  - EF 0000000, grade 3 diastolic dysfunction  PHYSICAL EXAM General: Vital signs reviewed.  Patient is elderly, in no acute distress and cooperative with exam.  Cardiovascular: Bradycardic, regular rhythm, S1 normal, S2 normal Pulmonary/Chest: Inspiratory crackles bilaterally, no wheezing or rhonchi. Abdominal: Soft, non-tender, non-distended, BS + Extremities: 1+ pitting edema in lower extremities to knees bilaterally  LABS: Basic Metabolic Panel:  Recent Labs  10/11/15 0322  10/12/15 0455  NA 135 133*  K 4.6 4.6  CL 101 99*  CO2 17* 17*  GLUCOSE 110* 97  BUN 111* 118*  CREATININE 6.47* 7.31*  CALCIUM 8.6* 8.5*  PHOS 8.5* 9.0*   Liver Function Tests:  Recent Labs  10/11/15 0322 10/12/15 0455  ALBUMIN 2.6* 2.5*   CBC:  Recent Labs  10/11/15 0330 10/12/15 0455  WBC 11.8* 11.6*  HGB 8.1* 8.1*  HCT 26.5* 26.1*  MCV 85.5 84.5  PLT 294 304   Coag Panel:   Lab Results  Component Value Date   INR 1.23 10/03/2015   INR 0.97 10/02/2009   TEE 10/09/15: - Left ventricle: Systolic function was normal. The estimated  ejection fraction was in the range of 55% to 60%. No evidence of  thrombus. - Aortic valve: No evidence of vegetation. There was mild  regurgitation. - Mitral valve: There was mild regurgitation. - Left atrium: The atrium was moderately to severely dilated. No  evidence of thrombus in the atrial cavity or appendage. No  evidence of thrombus in the atrial cavity or appendage. No  evidence of thrombus in the atrial cavity or appendage. - Atrial septum: No defect or patent foramen ovale was identified  by color flow Doppler. - Tricuspid valve: There was moderate regurgitation.  RADIOLOGY: US Carotid Duplex Bilateral  09/29/2015  CLINICAL DATA:  Bilateral carotid artery stenosis. EXAM: BILATERAL CAROTID  DUPLEX ULTRASOUND TECHNIQUE: Pearline Cables scale imaging, color Doppler and duplex ultrasound were performed of bilateral carotid and vertebral arteries in the neck. COMPARISON:  11/24/2014. FINDINGS: Criteria: Quantification of carotid stenosis is based on velocity parameters that correlate the residual internal carotid diameter with NASCET-based stenosis levels, using the diameter of the distal internal carotid lumen as the denominator for stenosis measurement. The following velocity measurements were obtained: RIGHT ICA:  113/16 cm/sec CCA:  123456 cm/sec SYSTOLIC ICA/CCA RATIO:  1.0 DIASTOLIC ICA/CCA RATIO:  1.5 ECA:  128 cm/sec LEFT ICA:   393/82 cm/sec CCA:  [id number] cm/sec SYSTOLIC ICA/CCA RATIO:  4.6 DIASTOLIC ICA/CCA RATIO:  5.8 ECA:  160 cm/sec RIGHT CAROTID ARTERY: Mild to moderate right carotid bifurcation atherosclerotic vascular disease. No flow limiting stenosis. Degree of stenosis less than 50%. RIGHT VERTEBRAL ARTERY:  Patent with antegrade flow. LEFT CAROTID ARTERY: Prominent left carotid bifurcation and proximal ICA atherosclerotic vascular plaque. Degree of stenosis greater than 70%. LEFT VERTEBRAL ARTERY:  Patent antegrade flow. IMPRESSION: 1. Greater than 70 degrees stenosis left carotid bifurcation and proximal ICA. Vascular surgery consultation suggested. 2. Atherosclerotic vascular plaque right carotid bifurcation. Degree of stenosis less than 50%. Vertebral arteries are patent antegrade flow. Electronically Signed   By: Marcello Moores  Register   On: 09/29/2015 13:15   Nm Myocar Multi W/spect W/wall Motion / Ef  10/10/2015  CLINICAL DATA:  70 year old with chest pain. EXAM: MYOCARDIAL IMAGING WITH SPECT (REST AND PHARMACOLOGIC-STRESS) GATED LEFT VENTRICULAR WALL MOTION STUDY LEFT VENTRICULAR EJECTION FRACTION TECHNIQUE: Standard myocardial SPECT imaging was performed after resting intravenous injection of 10 mCi Tc-55m tetrofosmin. Subsequently, intravenous infusion of Lexiscan was performed under the supervision of the Cardiology staff. At peak effect of the drug, 30 mCi Tc-34m tetrofosmin was injected intravenously and standard myocardial SPECT imaging was performed. Quantitative gated imaging was also performed to evaluate left ventricular wall motion, and estimate left ventricular ejection fraction. COMPARISON:  Chest radiograph 10/05/2015 FINDINGS: Perfusion: There are fixed defects involving the anterior, anteroseptal and lateral walls. These findings are compatible with areas of infarct. In addition, there is concern for mild reversibility in the mid segments of the anteroseptal wall and anterior wall. These areas are concerning  for peri-infarct ischemia. Wall Motion: Dyskinesia at the apex. Left Ventricular Ejection Fraction: 45 % End diastolic volume [id number] End systolic volume 68 ml IMPRESSION: 1. Concern for mild peri-infarct ischemia involving the anterior wall and anteroseptal wall. Evidence for infarcts involving the anterior and lateral walls. 2. Dyskinesia at the apex. 3. Left ventricular ejection fraction is 45%. 4. Non invasive risk stratification*: Intermediate *2012 Appropriate Use Criteria for Coronary Revascularization Focused Update: J Am Coll Cardiol. [id number]. http://content.[email or web address].aspx?articleid=[email or web address] Electronically Signed   By: Markus Daft M.D.   On: 10/10/2015 17:08   US Renal Port  10/05/2015  CLINICAL DATA:  Chronic renal failure with acute exacerbation EXAM: RENAL ULTRASOUND COMPARISON:  September 05, 2008 FINDINGS: Right Kidney: Length: 11.2 cm. Echogenicity within normal limits. Renal cortical thickness is low normal. No mass, perinephric fluid, or hydronephrosis visualized. No sonographically demonstrable calculus or ureterectasis. Left Kidney: Length: 11.4 cm. Echogenicity within normal limits. Renal cortical thickness is low normal. No mass, perinephric fluid, or hydronephrosis visualized. No sonographically demonstrable calculus or ureterectasis. Bladder: Urinary bladder is decompressed with a Foley catheter and cannot be assessed. IMPRESSION: Low normal renal cortical thickness, a finding that may be seen with medical renal disease. No increase in renal echogenicity or evidence of obstructing focus on either side. Study otherwise unremarkable. Electronically Signed  By: Lowella Grip III M.D.   On: 10/05/2015 17:47   Dg Chest Port 1 View  10/11/2015  CLINICAL DATA:  Central line placement. EXAM: PORTABLE CHEST 1 VIEW COMPARISON:  10/05/2015. FINDINGS: 1819 hours. Right IJ central venous catheter has been placed, extending to the mid SVC level. The heart size and mediastinal  contours are stable status post CABG. Pulmonary edema appears mildly improved. No pneumothorax or significant pleural effusion identified. IMPRESSION: Central line extends to the mid SVC level. No evidence of pneumothorax. Mild improvement in pulmonary edema. Electronically Signed   By: Richardean Sale M.D.   On: 10/11/2015 18:31   Dg Chest Port 1 View  10/05/2015  CLINICAL DATA:  Pulmonary edema, history of coronary artery disease, CHF, CABG. Acute respiratory failure with hypoxia. EXAM: PORTABLE CHEST 1 VIEW COMPARISON:  Portable chest x-ray of Oct 04, 2015 FINDINGS: The lungs are well-expanded. The interstitial markings remain increased. The left hemidiaphragm is slightly better demonstrated today but increased retrocardiac density persists. The cardiac silhouette remains enlarged. The pulmonary vascularity is slightly less engorged. There are post CABG changes. IMPRESSION: CHF superimposed upon COPD. Pulmonary interstitial and mild alveolar edema, slightly improved. Left basilar atelectasis or pneumonia and small left pleural effusion. Electronically Signed   By: David  Martinique M.D.   On: 10/05/2015 07:25   Dg Chest Port 1 View  10/04/2015  CLINICAL DATA:  Hypoxia.  Dyspnea EXAM: PORTABLE CHEST 1 VIEW COMPARISON:  10/03/2015 FINDINGS: Airspace opacities in the central and basilar regions have worsened from the earlier study. Probable left pleural effusion. Moderate vascular and interstitial congestion. Moderate cardiomegaly. IMPRESSION: The findings likely represent congestive heart failure. Infectious infiltrates cannot be excluded. Electronically Signed   By: Andreas Newport M.D.   On: 10/04/2015 01:39   Dg Chest Port 1 View  10/03/2015  CLINICAL DATA:  Shortness of breath EXAM: PORTABLE CHEST 1 VIEW COMPARISON:  10/02/2015 FINDINGS: Cardiomegaly again noted. Status post CABG. Patchy airspace disease bilaterally especially lower lobes again noted. Probable small left pleural effusion with left basilar  atelectasis or infiltrate. Central mild vascular congestion without convincing pulmonary edema. IMPRESSION: Patchy airspace disease bilaterally especially lower lobes again noted. Probable small left pleural effusion and left basilar atelectasis or infiltrate. Central mild vascular congestion without convincing pulmonary edema. Electronically Signed   By: Lahoma Crocker M.D.   On: 10/03/2015 08:37   Dg Chest Port 1 View  10/02/2015  CLINICAL DATA:  Acute respiratory failure. EXAM: PORTABLE CHEST 1 VIEW COMPARISON:  10/01/2015 FINDINGS: Airspace opacities persist throughout the central and basilar regions of both lungs, without significant interval change. No pneumothorax. Unchanged cardiomegaly. IMPRESSION: No significant interval change in the bilateral airspace opacities. Electronically Signed   By: Andreas Newport M.D.   On: 10/02/2015 23:10   Dg Chest Port 1 View  10/01/2015  CLINICAL DATA:  Initial evaluation for acute chest pain. EXAM: PORTABLE CHEST 1 VIEW COMPARISON:  Prior radiograph from 09/30/2015. FINDINGS: Median sternotomy wires with underlying CABG markers again noted. Cardiomegaly stable. Atheromatous plaque within the aortic arch. Lungs mildly hypoinflated with elevation left hemidiaphragm. Interval worsening and patchy right perihilar and basilar opacity, worrisome for progressive pneumonia. Additional patchy opacity at the left lung base may reflect atelectasis or infiltrate. Mild vascular congestion without overt pulmonary edema. No definite pleural effusion, although the right costophrenic angle is incompletely visualized. No pneumothorax. No acute osseus abnormality. IMPRESSION: 1. Interval worsening in patchy right perihilar and basilar opacity, worrisome for progressive pneumonia. 2. Elevation of the  left hemidiaphragm with additional patchy left basilar patchy opacity, which may reflect atelectasis or additional infiltrate. 3. Stable cardiomegaly with mild pulmonary vascular congestion  without overt pulmonary edema. Electronically Signed   By: Jeannine Boga M.D.   On: 10/01/2015 05:15   Dg Chest Port 1 View  09/30/2015  CLINICAL DATA:  Dyspnea, cough, fever. EXAM: PORTABLE CHEST 1 VIEW COMPARISON:  05/31/2015 FINDINGS: There is unchanged moderate cardiomegaly. There is patchy right base opacity which could represent pneumonia. No large effusion. IMPRESSION: Patchy right lung base opacity, potentially pneumonia although hemorrhage or asymmetric edema could also produce this appearance. Electronically Signed   By: Andreas Newport M.D.   On: 09/30/2015 22:33   ASSESSMENT/PLAN:   Ms. Bonde is a 70 yo female with PMHx of CAD s/p CABG  In 2005, carotid artery stenosis, chronic diastolic CHF who presented to [facility] ED on 5/6 with complaint of 24 hours of chest pain and found to be in acute respiratory failure with hypoxia likely secondary to CAP. Cardiology was consulted for chest pain and new onset afib with RVR.  Chest Pain/CAD s/p CABG: Myoview on 5/17 was intermediate and showed an anterior defect with concern for per-infarct ischemia. Patient would benefit from a cardiac cath at some point; however, her worsening renal function makes timing difficult.  -Nitroglycerin prn We will discuss with Nephrology if plans will be for long term HD, then patient can proceed with heart cath and we will hold start of coumadin. If plans for HD are undetermined or for short term, we will start coumadin and put off cath for a short time period. -ASA 81 mg daily -Atorvastatin 80 mg daily  -Heparin gtt -Metoprolol 50 mg BID -Bidil 20-37.5 1 tablet TID  Sinus Bradycardia: New onset afib s/p TEE with successful cardioversion on 5/15 into sinus bradycardia. HR 40-50s, metoprolol has been decreased. -On heparin gtt, will transition to coumadin once no further surgical interventions required -Amiodarone 200 mg BID  -Metoprolol 50 mg BID, hold for HR <50  HTN: 130-150s/40-60s this  morning. -Metoprolol 50 mg BID -Bidil 20-37.5 1 tablet TID -Amlodipine 5 mg daily   Chronic HFpEF:  Myoview showed EF of 45%. Previous echo 55-60% with grade 3 diastolic dysfunction.  -Metoprolol 50 mg BID -Bidil 20-37.5 1 tablet TID -Amlodipine 5 mg daily -Holding home lasix due to worsening renal function -Monitor fluid status and renal function  Acute on Chronic CKD III: Likely 2/2 ATN. Creatinine baseline 2.1, worsening this admission with creatinine >7 this morning, BUN 118, acidotic and hyponatremic. HD cath placed yesterday with likely start to iHD soon. We will discuss with Nephrology if plans will be for long term HD, then patient can proceed with heart cath and we will hold start of coumadin. If plans for HD are undetermined or for short term, we will start coumadin and put off cath for a short time period. -Nephrology following -Holding Lasix  DVT/PE ppx: On heparin gtt, transition to coumadin once no further interventions required  Martyn Malay, DO PGY-2 Internal Medicine Resident Pager # [phone] 10/12/2015 10:10 AM

## 2015-10-12 NOTE — Progress Notes (Signed)
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 amiodarone  200 mg Oral BID  . amLODipine  5 mg Oral Daily  . aspirin EC  81 mg Oral Daily  . atorvastatin  80 mg Oral QPM  . calcium acetate  1,334 mg Oral TID WC  . insulin aspart  0-20 Units Subcutaneous TID WC  . insulin aspart  0-5 Units Subcutaneous QHS  . insulin aspart protamine- aspart  15 Units Subcutaneous BID WC  . isosorbide-hydrALAZINE  1 tablet Oral TID  . loratadine  10 mg Oral Daily  . metoprolol tartrate  50 mg Oral BID  . sertraline  100 mg Oral Daily  . sodium bicarbonate  650 mg Oral BID  . sodium chloride flush  10-40 mL Intracatheter Q12H  . sodium chloride flush  3 mL Intravenous Q12H   Continuous Infusions: . sodium chloride 10 mL/hr at 10/07/15 2214  . sodium chloride    . sodium chloride    . heparin 1,600 Units/hr (10/12/15 0800)   PRN Meds:.heparin, HYDROcodone-acetaminophen, nitroGLYCERIN, sodium chloride flush, sodium chloride flush, zolpidem  Current Labs: reviewed    Physical Exam:  Blood pressure 161/50, pulse 56, temperature 97.8 F (36.6 C), temperature source Oral, resp. rate 25, height 5\' 5"  (1.651 m), weight 111.5 kg (245 lb 13 oz), SpO2 96 %. GEN: NAD ENT: NCAT EYES: EOMI CV: RRR PULM: Diminished in bases ABD: s/nt/nd SKIN: no rashes/lesions EXT:3+ LEE R IJ Temp Cath  RENAL STUDIES 1. Renal US 5/11: negative for obstruction 2. Urine Sediment: numerous bacteria. Scattered brown pigmented granular casts. Occ monomorphic RBC no dysmorphic features. No cellular  casts 3. Renal Doppler 5/17: no obvious arterial obstruction, inc RIs 4. 5/17 CK neg 5. R IJ Temp Cath placed by PCCM  Assessment  1. AoCKD3 2/2 ATN 1. Baseline 1.6-2.3 2. No obstruction or obvious nephrotoxins; duplex neg 3. Diuretics have been held for several days 4. Based on urine sediment with muddy brown casts, looks like has had ATN 5. Start iHD Today 6. Very possible for recovery, she is not ESRD 2. dCHF Exacerbation 3. CAP s/p Azithro and Ceftriaxone 4. AFib s/p DCCV 5/15, successful 5. HTN 6. Obesity 7. DM2 8. CAD s/p CABG, r/o ACS with Nuc ST: cardiology following  Plan 1. HD today, 2L UF, No heparin 2. Daily weights, Daily Renal Panel, Strict I/Os, Avoid nephrotoxins (NSAIDs, judicious IV Contrast)   Pearson Grippe MD 10/12/2015, 10:40 AM   Recent Labs Lab 10/10/15 0212 10/11/15 0322 10/12/15 0455  NA 135 135 133*  K 4.7 4.6 4.6  CL 102 101 99*  CO2 18* 17* 17*  GLUCOSE 163* 110* 97  BUN 101* 111* 118*  CREATININE 5.32* 6.47* 7.31*  CALCIUM 8.3* 8.6* 8.5*  PHOS 8.4* 8.5* 9.0*    Recent Labs Lab 10/10/15 0212 10/11/15 0330 10/12/15 0455  WBC 11.4* 11.8* 11.6*  HGB 8.1* 8.1* 8.1*  HCT 26.2* 26.5* 26.1*  MCV 85.9 85.5 84.5  PLT 288 294 304

## 2015-10-12 NOTE — Care Management Important Message (Signed)
Important Message  Patient Details  Name: JERENE CICHY MRN: [id number] Date of Birth: [birth year]   Medicare Important Message Given:  Yes    Lacretia Leigh, RN 10/12/2015, 9:44 AM

## 2015-10-13 DIAGNOSIS — N179 Acute kidney failure, unspecified: Secondary | ICD-10-CM

## 2015-10-13 DIAGNOSIS — N189 Chronic kidney disease, unspecified: Secondary | ICD-10-CM

## 2015-10-13 DIAGNOSIS — I161 Hypertensive emergency: Secondary | ICD-10-CM

## 2015-10-13 LAB — CBC
HEMATOCRIT: 23.8 % — AB (ref 36.0–46.0)
Hemoglobin: 7.5 g/dL — ABNORMAL LOW (ref 12.0–15.0)
MCH: 26.2 pg (ref 26.0–34.0)
MCHC: 31.5 g/dL (ref 30.0–36.0)
MCV: 83.2 fL (ref 78.0–100.0)
Platelets: 240 10*3/uL (ref 150–400)
RBC: 2.86 MIL/uL — ABNORMAL LOW (ref 3.87–5.11)
RDW: 15.3 % (ref 11.5–15.5)
WBC: 9 10*3/uL (ref 4.0–10.5)

## 2015-10-13 LAB — RENAL FUNCTION PANEL
Albumin: 2.3 g/dL — ABNORMAL LOW (ref 3.5–5.0)
Anion gap: 11 (ref 5–15)
BUN: 62 mg/dL — AB (ref 6–20)
CHLORIDE: 99 mmol/L — AB (ref 101–111)
CO2: 25 mmol/L (ref 22–32)
Calcium: 8.1 mg/dL — ABNORMAL LOW (ref 8.9–10.3)
Creatinine, Ser: 4.54 mg/dL — ABNORMAL HIGH (ref 0.44–1.00)
GFR calc Af Amer: 10 mL/min — ABNORMAL LOW (ref >60)
GFR, EST NON AFRICAN AMERICAN: 9 mL/min — AB (ref >60)
GLUCOSE: 144 mg/dL — AB (ref 65–99)
POTASSIUM: 3.8 mmol/L (ref 3.5–5.1)
Phosphorus: 5.3 mg/dL — ABNORMAL HIGH (ref 2.5–4.6)
Sodium: 135 mmol/L (ref 135–145)

## 2015-10-13 LAB — PREPARE RBC (CROSSMATCH)

## 2015-10-13 LAB — GLUCOSE, CAPILLARY
GLUCOSE-CAPILLARY: 145 mg/dL — AB (ref 65–99)
GLUCOSE-CAPILLARY: 257 mg/dL — AB (ref 65–99)
GLUCOSE-CAPILLARY: 158 mg/dL — AB (ref 65–99)
GLUCOSE-CAPILLARY: 131 mg/dL — AB (ref 65–99)

## 2015-10-13 LAB — HEPATITIS B CORE ANTIBODY, TOTAL: Hep B Core Total Ab: NEGATIVE

## 2015-10-13 LAB — HEPATITIS B SURFACE ANTIGEN: Hepatitis B Surface Ag: NEGATIVE

## 2015-10-13 LAB — HEPATITIS B SURFACE ANTIBODY,QUALITATIVE: HEP B S AB: NONREACTIVE

## 2015-10-13 LAB — HEPARIN LEVEL (UNFRACTIONATED): Heparin Unfractionated: 0.57 IU/mL (ref 0.30–0.70)

## 2015-10-13 MED ORDER — SODIUM CHLORIDE 0.9 % IV SOLN: Freq: Once | INTRAVENOUS | Status: DC

## 2015-10-13 MED ORDER — AMIODARONE HCL 200 MG PO TABS
200.0000 mg | ORAL_TABLET | Freq: Every day | ORAL | Status: DC
  Administered 2015-10-14 – 2015-10-18 (×5): 200 mg via ORAL
  Filled 2015-10-13 (×5): qty 1

## 2015-10-13 MED ORDER — METOPROLOL TARTRATE 25 MG PO TABS
25.0000 mg | ORAL_TABLET | Freq: Two times a day (BID) | ORAL | Status: DC
  Administered 2015-10-13 – 2015-10-18 (×10): 25 mg via ORAL
  Filled 2015-10-13 (×10): qty 1

## 2015-10-13 MED ORDER — AMLODIPINE BESYLATE 10 MG PO TABS
10.0000 mg | ORAL_TABLET | Freq: Every day | ORAL | Status: DC
  Administered 2015-10-13 – 2015-10-18 (×6): 10 mg via ORAL
  Filled 2015-10-13 (×6): qty 1

## 2015-10-13 MED ORDER — HEPARIN (PORCINE) IN NACL 100-0.45 UNIT/ML-% IJ SOLN
1600.0000 [IU]/h | INTRAMUSCULAR | Status: DC
  Administered 2015-10-13 (×2): 1600 [IU]/h via INTRAVENOUS
  Filled 2015-10-13: qty 250

## 2015-10-13 MED ORDER — AMIODARONE HCL 200 MG PO TABS
200.0000 mg | ORAL_TABLET | Freq: Two times a day (BID) | ORAL | Status: AC
  Administered 2015-10-13 (×2): 200 mg via ORAL
  Filled 2015-10-13 (×2): qty 1

## 2015-10-13 NOTE — Progress Notes (Signed)
[facility] for Heparin  Indication: afib  Allergies  Allergen Reactions  . Carbamazepine Other (See Comments)    Reaction to tegretol - loopy  . Clonazepam Other (See Comments)    Unknown reaction  . Gemfibrozil Other (See Comments)    Patient is not aware of this allergy but states that she has side effects to a cholesterol medication in the past  . Macrobid [Nitrofurantoin] Nausea And Vomiting  . Oxycodone Other (See Comments)    hallucinations    Patient Measurements: Height: 5\' 5"  (165.1 cm) Weight: 242 lb 4.6 oz (109.9 kg) IBW/kg (Calculated) : 57  Vital Signs: Temp: 98.4 F (36.9 C) (05/19 0827) Temp Source: Oral (05/19 0827) BP: 154/51 mmHg (05/19 1000) Pulse Rate: 51 (05/19 1000)  Labs:  Recent Labs  10/11/15 0322  10/11/15 0330 10/12/15 0455 10/13/15 0400 10/13/15 0434 10/13/15 0436  HGB  --   < > 8.1* 8.1*  --   --  7.5*  HCT  --   --  26.5* 26.1*  --   --  23.8*  PLT  --   --  294 304  --   --  240  HEPARINUNFRC  --   --  0.48 0.51 0.57  --   --   CREATININE 6.47*  --   --  7.31*  --  4.54*  --   CKTOTAL 30*  --   --   --   --   --   --   < > = values in this interval not displayed.  Estimated Creatinine Clearance: 14.2 mL/min (by C-G formula based on Cr of 4.54).   Assessment: Heparin gtt for new onset AFib. Level remains therapeutic this AM. Hgb dropped to 7.5.  Goal of Therapy:  Heparin level 0.3-0.7 units/ml Monitor platelets by anticoagulation protocol: Yes   Plan:  - Due to drop in hgb, holding heparin until 1600 this afternoon - F/u restart heparin and CBC   Joya San, PharmD Clinical Pharmacy Resident Pager # [phone] 10/13/2015 11:24 AM

## 2015-10-13 NOTE — Progress Notes (Signed)
Rehab admissions - I met with patient at the bedside.  She tells me that she may need only one more HD treatment since she is making urine now.  Not medically ready for inpatient rehab today.  I will check back on Monday for progress.  Call me for questions.  #[phone]

## 2015-10-13 NOTE — Progress Notes (Signed)
HISTORY: Briana [name] is a 70 yo female with PMHx of CAD s/p CABG  In 2005, carotid artery stenosis, chronic diastolic CHF, OSA, HTN, HLD, and CKD who presented to [facility] ED on 5/6 with complaint of 24 hours of chest pain. Patient was found to be in acute respiratory failure with hypoxia likely secondary to CAP. Cardiology was consulted for chest pain on 5/7. EKG showed lateral ST depression with a mild troponin elevation at 0.12. Patient developed atrial fibrillation with RVR on 5/9 and is s/p successful cardioversion to sinus bradycardia on 5/15.  SUBJECTIVE:  Patient was seen and examined this morning. She continues to have increased orthopnea, but states she is urinating well and denies any recurrent chest pain or pressure.  OBJECTIVE:   Vitals:   Filed Vitals:   10/13/15 0827 10/13/15 0956 10/13/15 0957 10/13/15 1000  BP: 144/46 152/52 152/52 154/51  Pulse: 48  52 51  Temp: 98.4 F (36.9 C)     TempSrc: Oral     Resp: 14   10  Height:      Weight:      SpO2: 98%   97%   I&O's:    Intake/Output Summary (Last 24 hours) at 10/13/15 1055 Last data filed at 10/13/15 1002  Gross per 24 hour  Intake    618 ml  Output   2550 ml  Net  -1932 ml   TELEMETRY:   ECHO 5/8: Impressions: - The right ventricular systolic pressure was increased consistent  with severe pulmonary hypertension. PA peak pressure 69.  - EF 0000000, grade 3 diastolic dysfunction  PHYSICAL EXAM General: Vital signs reviewed.  Patient is elderly, in no acute distress and cooperative with exam.  Cardiovascular: Bradycardic, regular rhythm, S1 normal, S2 normal Pulmonary/Chest: Inspiratory crackles bilaterally, no wheezing or rhonchi. Abdominal: Soft, non-tender, non-distended, BS + Extremities: 1+ pitting edema in lower extremities to knees bilaterally  LABS: Basic Metabolic Panel:  Recent Labs  10/12/15 0455 10/13/15 0434  NA 133* 135  K 4.6 3.8  CL 99* 99*  CO2 17* 25  GLUCOSE 97 144*  BUN  118* 62*  CREATININE 7.31* 4.54*  CALCIUM 8.5* 8.1*  PHOS 9.0* 5.3*   Liver Function Tests:  Recent Labs  10/12/15 0455 10/13/15 0434  ALBUMIN 2.5* 2.3*   CBC:  Recent Labs  10/12/15 0455 10/13/15 0436  WBC 11.6* 9.0  HGB 8.1* 7.5*  HCT 26.1* 23.8*  MCV 84.5 83.2  PLT 304 240   Coag Panel:   Lab Results  Component Value Date   INR 1.23 10/03/2015   INR 0.97 10/02/2009   TEE 10/09/15: - Left ventricle: Systolic function was normal. The estimated  ejection fraction was in the range of 55% to 60%. No evidence of  thrombus. - Aortic valve: No evidence of vegetation. There was mild  regurgitation. - Mitral valve: There was mild regurgitation. - Left atrium: The atrium was moderately to severely dilated. No  evidence of thrombus in the atrial cavity or appendage. No  evidence of thrombus in the atrial cavity or appendage. No  evidence of thrombus in the atrial cavity or appendage. - Atrial septum: No defect or patent foramen ovale was identified  by color flow Doppler. - Tricuspid valve: There was moderate regurgitation.  RADIOLOGY: US Carotid Duplex Bilateral  09/29/2015  CLINICAL DATA:  Bilateral carotid artery stenosis. EXAM: BILATERAL CAROTID DUPLEX ULTRASOUND TECHNIQUE: Pearline Cables scale imaging, color Doppler and duplex ultrasound were performed of bilateral carotid and vertebral arteries in  the neck. COMPARISON:  11/24/2014. FINDINGS: Criteria: Quantification of carotid stenosis is based on velocity parameters that correlate the residual internal carotid diameter with NASCET-based stenosis levels, using the diameter of the distal internal carotid lumen as the denominator for stenosis measurement. The following velocity measurements were obtained: RIGHT ICA:  113/16 cm/sec CCA:  123456 cm/sec SYSTOLIC ICA/CCA RATIO:  1.0 DIASTOLIC ICA/CCA RATIO:  1.5 ECA:  128 cm/sec LEFT ICA:  393/82 cm/sec CCA:  [id number] cm/sec SYSTOLIC ICA/CCA RATIO:  4.6 DIASTOLIC ICA/CCA RATIO:  5.8  ECA:  160 cm/sec RIGHT CAROTID ARTERY: Mild to moderate right carotid bifurcation atherosclerotic vascular disease. No flow limiting stenosis. Degree of stenosis less than 50%. RIGHT VERTEBRAL ARTERY:  Patent with antegrade flow. LEFT CAROTID ARTERY: Prominent left carotid bifurcation and proximal ICA atherosclerotic vascular plaque. Degree of stenosis greater than 70%. LEFT VERTEBRAL ARTERY:  Patent antegrade flow. IMPRESSION: 1. Greater than 70 degrees stenosis left carotid bifurcation and proximal ICA. Vascular surgery consultation suggested. 2. Atherosclerotic vascular plaque right carotid bifurcation. Degree of stenosis less than 50%. Vertebral arteries are patent antegrade flow. Electronically Signed   By: Marcello Moores  Register   On: 09/29/2015 13:15   Nm Myocar Multi W/spect W/wall Motion / Ef  10/10/2015  CLINICAL DATA:  70 year old with chest pain. EXAM: MYOCARDIAL IMAGING WITH SPECT (REST AND PHARMACOLOGIC-STRESS) GATED LEFT VENTRICULAR WALL MOTION STUDY LEFT VENTRICULAR EJECTION FRACTION TECHNIQUE: Standard myocardial SPECT imaging was performed after resting intravenous injection of 10 mCi Tc-3m tetrofosmin. Subsequently, intravenous infusion of Lexiscan was performed under the supervision of the Cardiology staff. At peak effect of the drug, 30 mCi Tc-8m tetrofosmin was injected intravenously and standard myocardial SPECT imaging was performed. Quantitative gated imaging was also performed to evaluate left ventricular wall motion, and estimate left ventricular ejection fraction. COMPARISON:  Chest radiograph 10/05/2015 FINDINGS: Perfusion: There are fixed defects involving the anterior, anteroseptal and lateral walls. These findings are compatible with areas of infarct. In addition, there is concern for mild reversibility in the mid segments of the anteroseptal wall and anterior wall. These areas are concerning for peri-infarct ischemia. Wall Motion: Dyskinesia at the apex. Left Ventricular Ejection  Fraction: 45 % End diastolic volume [id number] ml End systolic volume 68 ml IMPRESSION: 1. Concern for mild peri-infarct ischemia involving the anterior wall and anteroseptal wall. Evidence for infarcts involving the anterior and lateral walls. 2. Dyskinesia at the apex. 3. Left ventricular ejection fraction is 45%. 4. Non invasive risk stratification*: Intermediate *2012 Appropriate Use Criteria for Coronary Revascularization Focused Update: J Am Coll Cardiol. [id number]. http://content.airportbarriers.com.aspx?articleid=[email or web address] Electronically Signed   By: Markus Daft M.D.   On: 10/10/2015 17:08   US Renal Port  10/05/2015  CLINICAL DATA:  Chronic renal failure with acute exacerbation EXAM: RENAL ULTRASOUND COMPARISON:  September 05, 2008 FINDINGS: Right Kidney: Length: 11.2 cm. Echogenicity within normal limits. Renal cortical thickness is low normal. No mass, perinephric fluid, or hydronephrosis visualized. No sonographically demonstrable calculus or ureterectasis. Left Kidney: Length: 11.4 cm. Echogenicity within normal limits. Renal cortical thickness is low normal. No mass, perinephric fluid, or hydronephrosis visualized. No sonographically demonstrable calculus or ureterectasis. Bladder: Urinary bladder is decompressed with a Foley catheter and cannot be assessed. IMPRESSION: Low normal renal cortical thickness, a finding that may be seen with medical renal disease. No increase in renal echogenicity or evidence of obstructing focus on either side. Study otherwise unremarkable. Electronically Signed   By: Lowella Grip III M.D.   On: 10/05/2015 17:47   Dg Chest [facility]  10/11/2015  CLINICAL DATA:  Central line placement. EXAM: PORTABLE CHEST 1 VIEW COMPARISON:  10/05/2015. FINDINGS: 1819 hours. Right IJ central venous catheter has been placed, extending to the mid SVC level. The heart size and mediastinal contours are stable status post CABG. Pulmonary edema appears mildly improved. No  pneumothorax or significant pleural effusion identified. IMPRESSION: Central line extends to the mid SVC level. No evidence of pneumothorax. Mild improvement in pulmonary edema. Electronically Signed   By: Richardean Sale M.D.   On: 10/11/2015 18:31   Dg Chest Port 1 View  10/05/2015  CLINICAL DATA:  Pulmonary edema, history of coronary artery disease, CHF, CABG. Acute respiratory failure with hypoxia. EXAM: PORTABLE CHEST 1 VIEW COMPARISON:  Portable chest x-ray of Oct 04, 2015 FINDINGS: The lungs are well-expanded. The interstitial markings remain increased. The left hemidiaphragm is slightly better demonstrated today but increased retrocardiac density persists. The cardiac silhouette remains enlarged. The pulmonary vascularity is slightly less engorged. There are post CABG changes. IMPRESSION: CHF superimposed upon COPD. Pulmonary interstitial and mild alveolar edema, slightly improved. Left basilar atelectasis or pneumonia and small left pleural effusion. Electronically Signed   By: David  Martinique M.D.   On: 10/05/2015 07:25   Dg Chest Port 1 View  10/04/2015  CLINICAL DATA:  Hypoxia.  Dyspnea EXAM: PORTABLE CHEST 1 VIEW COMPARISON:  10/03/2015 FINDINGS: Airspace opacities in the central and basilar regions have worsened from the earlier study. Probable left pleural effusion. Moderate vascular and interstitial congestion. Moderate cardiomegaly. IMPRESSION: The findings likely represent congestive heart failure. Infectious infiltrates cannot be excluded. Electronically Signed   By: Andreas Newport M.D.   On: 10/04/2015 01:39   Dg Chest Port 1 View  10/03/2015  CLINICAL DATA:  Shortness of breath EXAM: PORTABLE CHEST 1 VIEW COMPARISON:  10/02/2015 FINDINGS: Cardiomegaly again noted. Status post CABG. Patchy airspace disease bilaterally especially lower lobes again noted. Probable small left pleural effusion with left basilar atelectasis or infiltrate. Central mild vascular congestion without convincing  pulmonary edema. IMPRESSION: Patchy airspace disease bilaterally especially lower lobes again noted. Probable small left pleural effusion and left basilar atelectasis or infiltrate. Central mild vascular congestion without convincing pulmonary edema. Electronically Signed   By: Lahoma Crocker M.D.   On: 10/03/2015 08:37   Dg Chest Port 1 View  10/02/2015  CLINICAL DATA:  Acute respiratory failure. EXAM: PORTABLE CHEST 1 VIEW COMPARISON:  10/01/2015 FINDINGS: Airspace opacities persist throughout the central and basilar regions of both lungs, without significant interval change. No pneumothorax. Unchanged cardiomegaly. IMPRESSION: No significant interval change in the bilateral airspace opacities. Electronically Signed   By: Andreas Newport M.D.   On: 10/02/2015 23:10   Dg Chest Port 1 View  10/01/2015  CLINICAL DATA:  Initial evaluation for acute chest pain. EXAM: PORTABLE CHEST 1 VIEW COMPARISON:  Prior radiograph from 09/30/2015. FINDINGS: Median sternotomy wires with underlying CABG markers again noted. Cardiomegaly stable. Atheromatous plaque within the aortic arch. Lungs mildly hypoinflated with elevation left hemidiaphragm. Interval worsening and patchy right perihilar and basilar opacity, worrisome for progressive pneumonia. Additional patchy opacity at the left lung base may reflect atelectasis or infiltrate. Mild vascular congestion without overt pulmonary edema. No definite pleural effusion, although the right costophrenic angle is incompletely visualized. No pneumothorax. No acute osseus abnormality. IMPRESSION: 1. Interval worsening in patchy right perihilar and basilar opacity, worrisome for progressive pneumonia. 2. Elevation of the left hemidiaphragm with additional patchy left basilar patchy opacity, which may reflect atelectasis or additional infiltrate. 3. Stable cardiomegaly  with mild pulmonary vascular congestion without overt pulmonary edema. Electronically Signed   By: Jeannine Boga  M.D.   On: 10/01/2015 05:15   Dg Chest Port 1 View  09/30/2015  CLINICAL DATA:  Dyspnea, cough, fever. EXAM: PORTABLE CHEST 1 VIEW COMPARISON:  05/31/2015 FINDINGS: There is unchanged moderate cardiomegaly. There is patchy right base opacity which could represent pneumonia. No large effusion. IMPRESSION: Patchy right lung base opacity, potentially pneumonia although hemorrhage or asymmetric edema could also produce this appearance. Electronically Signed   By: Andreas Newport M.D.   On: 09/30/2015 22:33   ASSESSMENT/PLAN:   Ms. Muenzer is a 70 yo female with PMHx of CAD s/p CABG  In 2005, carotid artery stenosis, chronic diastolic CHF who presented to [facility] ED on 5/6 with complaint of 24 hours of chest pain and found to be in acute respiratory failure with hypoxia likely secondary to CAP. Cardiology was consulted for chest pain and new onset afib with RVR.  Chest Pain/CAD s/p CABG: Myoview on 5/17 was intermediate and showed an anterior defect with concern for per-infarct ischemia. Patient would benefit from a cardiac cath at some point; however, her worsening renal function makes timing difficult as we are hoping for improvement from a renal standpoint. Cath will likely be put off until kidney function recovers.  -ASA 81 mg daily -Atorvastatin 80 mg daily  -Metoprolol 25 mg BID -Bidil 20-37.5 1 tablet TID -Amlodipine 10 mg daily  Sinus Bradycardia: New onset afib s/p TEE with successful cardioversion on 5/15 into sinus bradycardia. HR 40-50s, decreased metoprolol to 25 mg BID. Hopefully with decrease in amiodarone, will be able to titrate BB back up. -On heparin gtt, will transition to coumadin once no further surgical interventions required and rule out bleed -Amiodarone 200 mg BID, transition to 200 mg daily tomorrow  -Metoprolol 25 mg BID- consider increase as able  HTN: Hypertensive. -Decrease Metoprolol to 25 mg BID- consider increase as able -Bidil 20-37.5 1 tablet TID -Increase  Amlodipine 10 mg daily   Chronic HFpEF:  Myoview showed EF of 45%. Previous echo 55-60% with grade 3 diastolic dysfunction.  -Metoprolol 25 mg BID -Bidil 20-37.5 1 tablet TID -Amlodipine 10 mg daily -Holding home lasix due to worsening renal function -Monitor fluid status and renal function  Acute on Chronic CKD III: Likely 2/2 ATN. Creatinine baseline 2.1, worsening this admission. Patient was started on iHD yesterday given her creatinine >7 this morning, BUN 118, acidemia and hyponatremia. Plans are to monitor for renal recovery. UOP 595 yesterday. No HD today per Nephrology.  -Nephrology following -Holding Lasix  Normocytic Anemia: Hgb down from 8.1 to 7.5 this morning. It appears there was an initially drop at admission from 10 to 8. Unclear cause of drop, concern for bleeding. Needs further work up.  -Holding heparin for 6-7 hours given hemoglobin drop. Will restart this afternoon.  -Will ask Nephrology to give blood with dialysis  -Check FOBT  Hep B Surface Ab Negative: Not immune, would consider revaccination at some point.  DVT/PE ppx: Heparin gtt  Martyn Malay, DO PGY-2 Internal Medicine Resident Pager # [phone] 10/13/2015 10:55 AM

## 2015-10-13 NOTE — Progress Notes (Signed)
Admit: 09/30/2015 LOS: 57  69F with AoCKD3 (BL 1.6-2.3) in setiting of CAP, A/C dCHF exacerbation, AFib with RVR eventually requiring DCCV  Subjective:  HD#1yesterday, 2L UF Made inc UOP yesterday Labs ok this AM Feels improved this AM, toelrated HD without problems   05/18 0701 - 05/19 0700 In: 374 [I.V.:374] Out: 2595 [Urine:595]  Filed Weights   10/12/15 1539 10/12/15 1842 10/13/15 0358  Weight: 111.8 kg (246 lb 7.6 oz) 109.7 kg (241 lb 13.5 oz) 109.9 kg (242 lb 4.6 oz)    Scheduled Meds: . amiodarone  200 mg Oral BID  . [START ON 10/14/2015] amiodarone  200 mg Oral Daily  . amLODipine  10 mg Oral Daily  . aspirin EC  81 mg Oral Daily  . atorvastatin  80 mg Oral QPM  . calcium acetate  1,334 mg Oral TID WC  . insulin aspart  0-20 Units Subcutaneous TID WC  . insulin aspart  0-5 Units Subcutaneous QHS  . insulin aspart protamine- aspart  15 Units Subcutaneous BID WC  . isosorbide-hydrALAZINE  1 tablet Oral TID  . loratadine  10 mg Oral Daily  . metoprolol tartrate  25 mg Oral BID  . sertraline  100 mg Oral Daily  . sodium bicarbonate  650 mg Oral BID  . sodium chloride flush  10-40 mL Intracatheter Q12H   Continuous Infusions: . sodium chloride 10 mL/hr at 10/07/15 2214  . sodium chloride     PRN Meds:.sodium chloride, sodium chloride, alteplase, heparin, heparin, HYDROcodone-acetaminophen, nitroGLYCERIN, sodium chloride flush, zolpidem  Current Labs: reviewed    Physical Exam:  Blood pressure 144/46, pulse 48, temperature 98.4 F (36.9 C), temperature source Oral, resp. rate 14, height 5\' 5"  (1.651 m), weight 109.9 kg (242 lb 4.6 oz), SpO2 98 %. GEN: NAD ENT: NCAT EYES: EOMI CV: RRR PULM: Diminished in bases ABD: s/nt/nd SKIN: no rashes/lesions EXT:3+ LEE R IJ Temp Cath  RENAL STUDIES 1. Renal US 5/11: negative for obstruction 2. Urine Sediment: numerous bacteria. Scattered brown pigmented granular casts. Occ monomorphic RBC no dysmorphic features. No  cellular casts 3. Renal Doppler 5/17: no obvious arterial obstruction, inc RIs 4. 5/17 CK neg 5. R IJ Temp Cath placed by PCCM 6. HD #1 5/18  Assessment  1. AoCKD3 2/2 ATN 1. Baseline 1.6-2.3 2. No obstruction or obvious nephrotoxins; duplex neg 3. Based on urine sediment with muddy brown casts, looks like has had ATN 4. iHD started 5/18 5. Very possible for recovery, she is not ESRD -- monitor now, inc UOP is suggestive 2. dCHF Exacerbation 3. CAP s/p Azithro and Ceftriaxone 4. AFib s/p DCCV 5/15, successful 5. HTN 6. Obesity 7. DM2 8. CAD s/p CABG, r/o ACS with Nuc ST: cardiology following  Plan 1. No HD today 2. Stop NaHCO3 3. Monitor UOP, labs in AM 4. Daily weights, Daily Renal Panel, Strict I/Os, Avoid nephrotoxins (NSAIDs, judicious IV Contrast)   Pearson Grippe MD 10/13/2015, 9:52 AM   Recent Labs Lab 10/11/15 0322 10/12/15 0455 10/13/15 0434  NA 135 133* 135  K 4.6 4.6 3.8  CL 101 99* 99*  CO2 17* 17* 25  GLUCOSE 110* 97 144*  BUN 111* 118* 62*  CREATININE 6.47* 7.31* 4.54*  CALCIUM 8.6* 8.5* 8.1*  PHOS 8.5* 9.0* 5.3*    Recent Labs Lab 10/11/15 0330 10/12/15 0455 10/13/15 0436  WBC 11.8* 11.6* 9.0  HGB 8.1* 8.1* 7.5*  HCT 26.5* 26.1* 23.8*  MCV 85.5 84.5 83.2  PLT 294 304 240

## 2015-10-13 NOTE — Progress Notes (Signed)
[place] TEAM 1 - Stepdown/ICU TEAM Progress Note  Briana [name] [id number] DOB: Feb 04, 1946 DOA: 09/30/2015 PCP: Antionette Fairy, PA-C  Admit HPI / Brief Narrative: [name] is an 70 y.o. WF PMHx Depression with AnxietyDM Type 2 with complication (neuropathy), CAD native artery, PVD, Chronic Diastolic CHF, Heart murmur, OSA, HTN, HLD,Adenomatous colon polyp, CKD   Who presented to the ED at [facility] on the evening of 5/6 with 24 hours of chest pain. In ED found to have: hypoxia, a fever to 101.6, WBC of 15.5, EKG changes (lateral ST depression), mild troponin elevation (0.12). BNP was elevated to 894.0, previously was 391. She had a CXR which showed alveolar opacities, most dense in the Rt base. Cultures were obtained, she was started on CAP coverage, and due to the hypoxia was transferred to [facility] for tx of pneumonia.    HPI/Subjective: 5/19  A/O 4, negative CP, decreased SOB. Sitting in chair. Base weight is 102 kg (225 pounds). No HD today, unsure if she scheduled for HD on 5/20   Assessment/Plan: Acute hypoxic Respiratory Failure in setting of bilateral pulmonary infiltrates. PNA vs Pulmonary Edema -continue current antibiotics 5 days  R/o PE; although think we have fairly good explanation for her hypoxia  -LE dopplers neg -Obtain VQ scan once A. fib RVR controlled  Possible CAP -Complete 5 day course antibiotics  CAD native artery  Acute on chronic Diastolic CHF/Pulmonary Hypertension -Hold all anticoagulants secondary to possible GI bleed; -Strict in and out since admission + 422ml -Daily weight Filed Weights   10/12/15 1539 10/12/15 1842 10/13/15 0358  Weight: 111.8 kg (246 lb 7.6 oz) 109.7 kg (241 lb 13.5 oz) 109.9 kg (242 lb 4.6 oz)  -Transfuse for hemoglobin<8 -5/9 Transfuse 1 unit PRBC -5/19 transfuse 1 unit PRBC  A-Fib RVR/RBBB(CHA2DS2 VASc score is 6+) - EKG shows A. fib RVR with RBBB -s/p DCCV 5/15  -See CHF - Cards following   HTN  emergency w/ associated pulmonary edema  -Amiodarone 200 mg BID -Amlodipine 5 mg daily -Metoprolol 100 mg BID   Acute on CKD (baseline Cr~1.6 -2 ) -Renal US; nondiagnostic for increased failure Lab Results  Component Value Date   CREATININE 4.54* 10/13/2015   CREATININE 7.31* 10/12/2015   CREATININE 6.47* 10/11/2015  -5/18 first session HD Per nephrology   Acute blood loss anemia? -Hemoglobin stable  -hemoccult negative  Recent Labs     10/11/15  0330  10/12/15  0455  10/13/15  0436  HGB  8.1*  8.1*  7.5*   Leukocytosis -Resolved   DM Type 2 controlled with complication -123456 hemoglobin A1c= 6.9  -NovoLog 70/30  15 units BID -Resistant SSI  Depression -Zoloft 100 mg daily   Deconditioning -PT/OT, evaluate patient with new onset A. fib RVR with deconditioning for CIR vs SNF   Code Status: FULL Family Communication:  Disposition Plan: Resolution of A. fib RVR    Consultants: Dr.Rakesh Elsworth Soho PC [name]  Dr. Glenetta [facility]    Procedure/Significant Events: CXR -- alveolar filling pattern, most dense in RLL. EKG - lateral ST depression, TWI throughout 5/7 Bilat LE Doppler; Negative DVT/SVT 5/8 echocardiogram;Left ventricle: mild focal basal hypertrophy of the septum. 55% to 60%. -(grade 3 diastolic dysfunction).-Left atrium: severely dilated. - Tricuspid valve: moderate regurgitation. - Pulmonary arteries: PA peak pressure: 69 mm Hg (S). 5/9 Transfuse 1 unit PRBC 5/11 renal ultrasound;-Low normal renal cortical thickness, a finding that may be seen with medical renal disease. Marland Kitchen  5/12 RVR persists  5/15 TEE w/ DCCV > sinus bradycardia  -5/19 transfuse 1 unit PRBC  Culture Blood 5/6 left arm/right hand NGTD Urine 5/6 negative final 5/7 MRSA by PCR negative  Antibiotics: Azithromycin 5/6 >>5/10 Ceftriaxone 5/6 >> 5/11  DVT prophylaxis: Heparin drip  Devices    LINES / TUBES:     Continuous Infusions: .  sodium chloride 10 mL/hr at 10/07/15 2214  . sodium chloride    . heparin      Objective: VITAL SIGNS: Temp: 98.1 F (36.7 C) (05/19 1519) Temp Source: Oral (05/19 1519) BP: 141/45 mmHg (05/19 1519) Pulse Rate: 49 (05/19 1519) SPO2; FIO2:   Intake/Output Summary (Last 24 hours) at 10/13/15 1717 Last data filed at 10/13/15 1359  Gross per 24 hour  Intake    746 ml  Output   2500 ml  Net  -1754 ml     Exam: General: A/O 4, positive acute respiratory distress Eyes: negative scleral hemorrhage, negative icterus ENT: Negative Runny nose, negative gingival bleeding, Neck:  Negative scars, masses, torticollis, lymphadenopathy, JVD Lungs: diminished breath sounds, Positive crackles bilateral diffusely   Cardiac; regular rhythm and rate, negative murmurs rubs or gallops, normal S1/S2 Abdomen: Morbidly Obese, negative abdominal pain, nondistended, positive soft, bowel sounds, no rebound, no ascites, no appreciable mass Extremities: No significant cyanosis, clubbing, positive bilateral lower extremity edema to hips 2- 3+ Psychiatric:  Negative depression, negative anxiety, negative fatigue, negative mania  Neurologic:  Cranial nerves II through XII intact, tongue/uvula midline, all extremities muscle strength 5/5, sensation intact throughout, negative dysarthria, negative expressive aphasia, negative receptive aphasia.    Data Reviewed: Basic Metabolic Panel:  Recent Labs Lab 10/09/15 0335 10/10/15 0212 10/11/15 0322 10/12/15 0455 10/13/15 0434  NA 135 135 135 133* 135  K 4.3 4.7 4.6 4.6 3.8  CL 104 102 101 99* 99*  CO2 17* 18* 17* 17* 25  GLUCOSE 140* 163* 110* 97 144*  BUN 90* 101* 111* 118* 62*  CREATININE 3.84* 5.32* 6.47* 7.31* 4.54*  CALCIUM 8.0* 8.3* 8.6* 8.5* 8.1*  PHOS 6.8* 8.4* 8.5* 9.0* 5.3*   Liver Function Tests:  Recent Labs Lab 10/07/15 0312  10/09/15 0335 10/10/15 0212 10/11/15 0322 10/12/15 0455 10/13/15 0434  AST 30  --   --   --   --   --    --   ALT 29  --   --   --   --   --   --   ALKPHOS 111  --   --   --   --   --   --   BILITOT 0.3  --   --   --   --   --   --   PROT 6.1*  --   --   --   --   --   --   ALBUMIN 2.4*  < > 2.6* 2.6* 2.6* 2.5* 2.3*  < > = values in this interval not displayed. No results for input(s): LIPASE, AMYLASE in the last 168 hours. No results for input(s): AMMONIA in the last 168 hours. CBC:  Recent Labs Lab 10/09/15 0335 10/10/15 0212 10/11/15 0330 10/12/15 0455 10/13/15 0436  WBC 11.3* 11.4* 11.8* 11.6* 9.0  HGB 8.2* 8.1* 8.1* 8.1* 7.5*  HCT 26.6* 26.2* 26.5* 26.1* 23.8*  MCV 86.4 85.9 85.5 84.5 83.2  PLT 286 288 294 304 240   Cardiac Enzymes:  Recent Labs Lab 10/11/15 0322  CKTOTAL 30*   BNP (last 3 results)  Recent Labs  05/31/15 1522 09/30/15 2140 10/02/15 2204  BNP 391.0* 894.0* 1262.5*    ProBNP (last 3 results) No results for input(s): PROBNP in the last 8760 hours.  CBG:  Recent Labs Lab 10/12/15 1127 10/12/15 2115 10/13/15 0848 10/13/15 1129 10/13/15 1639  GLUCAP 136* 212* 145* 257* 158*    No results found for this or any previous visit (from the past 240 hour(s)).   Studies:  Recent x-ray studies have been reviewed in detail by the Attending Physician  Scheduled Meds:  Scheduled Meds: . amiodarone  200 mg Oral BID  . [START ON 10/14/2015] amiodarone  200 mg Oral Daily  . amLODipine  10 mg Oral Daily  . aspirin EC  81 mg Oral Daily  . atorvastatin  80 mg Oral QPM  . calcium acetate  1,334 mg Oral TID WC  . insulin aspart  0-20 Units Subcutaneous TID WC  . insulin aspart  0-5 Units Subcutaneous QHS  . insulin aspart protamine- aspart  15 Units Subcutaneous BID WC  . isosorbide-hydrALAZINE  1 tablet Oral TID  . loratadine  10 mg Oral Daily  . metoprolol tartrate  25 mg Oral BID  . sertraline  100 mg Oral Daily  . sodium chloride flush  10-40 mL Intracatheter Q12H    Time spent on care of this patient: 40 mins   WOODS, Geraldo Docker ,  MD  [facility] Office  [phone] Pager - [phone]  On-Call/Text Page:      [email or web address]      password [email or web address]  If 7PM-7AM, please contact night-coverage [email or web address] Password [email or web address] 10/13/2015, 5:17 PM   LOS: 12 days   Care during the described time interval was provided by me .  I have reviewed this patient's available data, including medical history, events of note, physical examination, and all test results as part of my evaluation. I have personally reviewed and interpreted all radiology studies.   Dia Crawford, MD [phone] Pager

## 2015-10-13 NOTE — Progress Notes (Signed)
Physical Therapy Treatment Patient Details Name: Briana [name] MRN: [id number] DOB: 1946-03-06 Today's Date: 10/13/2015    History of Present Illness Pt adm with acute hypoxic Respiratory Failure in setting of bilateral pulmonary infiltrates. Favor acute Pulmonary Edema over PNA. Pt placed on bipap. PMH - DM, CAD, diastolic HF,     PT Comments    Pt was very motivated today and ambulated 150 ft using RW.  She required one seated rest break after ambulating 75 ft due to Bil LE fatigue.  Pt will benefit from continued skilled PT services to increase functional independence and safety.   Follow Up Recommendations  CIR     Equipment Recommendations  Rolling walker with 5" wheels    Recommendations for Other Services       Precautions / Restrictions Precautions Precautions: Fall Precaution Comments: monitor HR an O2 sats Restrictions Weight Bearing Restrictions: No    Mobility  Bed Mobility Overal bed mobility: Modified Independent             General bed mobility comments: Increased time and use of bed rail.  No cues or physical assist needed.  Transfers Overall transfer level: Needs assistance Equipment used: Rolling walker (2 wheeled) Transfers: Sit to/from Stand Sit to Stand: Supervision         General transfer comment: Pt stood from bed and chair w/ increased effort and supervison for safety.    Ambulation/Gait Ambulation/Gait assistance: Min guard Ambulation Distance (Feet): 150 Feet Assistive device: Rolling walker (2 wheeled) Gait Pattern/deviations: Step-through pattern;Decreased stride length;Trunk flexed   Gait velocity interpretation: Below normal speed for age/gender General Gait Details: cv's for upright posture and forward gaze. O2 sats stable 92% at lowest on 2L O2. HR maintained in 50's-60's. 1 seated rest break after ambulating 75 ft due to Bil LE fatigue.   Stairs            Wheelchair Mobility    Modified Rankin (Stroke  Patients Only)       Balance Overall balance assessment: Needs assistance Sitting-balance support: No upper extremity supported;Feet supported Sitting balance-Leahy Scale: Good     Standing balance support: Bilateral upper extremity supported;During functional activity Standing balance-Leahy Scale: Fair                      Cognition Arousal/Alertness: Awake/alert Behavior During Therapy: WFL for tasks assessed/performed Overall Cognitive Status: Within Functional Limits for tasks assessed                      Exercises General Exercises - Lower Extremity Ankle Circles/Pumps: AROM;Both;10 reps;Seated    General Comments        Pertinent Vitals/Pain Pain Assessment: No/denies pain    Home Living                      Prior Function            PT Goals (current goals can now be found in the care plan section) Acute Rehab PT Goals Patient Stated Goal: to get her strength back PT Goal Formulation: With patient Time For Goal Achievement: 10/20/15 Potential to Achieve Goals: Good    Frequency  Min 3X/week    PT Plan Equipment recommendations need to be updated    Co-evaluation             End of Session Equipment Utilized During Treatment: Gait belt;Oxygen Activity Tolerance: Patient tolerated treatment well;Patient limited by fatigue Patient left: in  chair;with call bell/phone within reach;with family/visitor present     Time: 1131-1156 PT Time Calculation (min) (ACUTE ONLY): 25 min  Charges:  $Gait Training: 23-37 mins                    G Codes:      Collie Siad PT, DPT  Pager: [phone] Phone: [phone] 10/13/2015, 1:06 PM

## 2015-10-14 DIAGNOSIS — I214 Non-ST elevation (NSTEMI) myocardial infarction: Secondary | ICD-10-CM | POA: Diagnosis present

## 2015-10-14 DIAGNOSIS — D62 Acute posthemorrhagic anemia: Secondary | ICD-10-CM

## 2015-10-14 LAB — CBC
HEMATOCRIT: 25.9 % — AB (ref 36.0–46.0)
HEMOGLOBIN: 8.1 g/dL — AB (ref 12.0–15.0)
MCH: 26.2 pg (ref 26.0–34.0)
MCHC: 31.3 g/dL (ref 30.0–36.0)
MCV: 83.8 fL (ref 78.0–100.0)
Platelets: 217 10*3/uL (ref 150–400)
RBC: 3.09 MIL/uL — AB (ref 3.87–5.11)
RDW: 15.1 % (ref 11.5–15.5)
WBC: 8.9 10*3/uL (ref 4.0–10.5)

## 2015-10-14 LAB — RENAL FUNCTION PANEL
ANION GAP: 13 (ref 5–15)
Albumin: 2.6 g/dL — ABNORMAL LOW (ref 3.5–5.0)
BUN: 68 mg/dL — ABNORMAL HIGH (ref 6–20)
CALCIUM: 8 mg/dL — AB (ref 8.9–10.3)
CO2: 25 mmol/L (ref 22–32)
Chloride: 99 mmol/L — ABNORMAL LOW (ref 101–111)
Creatinine, Ser: 4.73 mg/dL — ABNORMAL HIGH (ref 0.44–1.00)
GFR calc non Af Amer: 9 mL/min — ABNORMAL LOW (ref >60)
GFR, EST AFRICAN AMERICAN: 10 mL/min — AB (ref >60)
Glucose, Bld: 96 mg/dL (ref 65–99)
POTASSIUM: 3.8 mmol/L (ref 3.5–5.1)
Phosphorus: 5.5 mg/dL — ABNORMAL HIGH (ref 2.5–4.6)
SODIUM: 137 mmol/L (ref 135–145)

## 2015-10-14 LAB — GLUCOSE, CAPILLARY
GLUCOSE-CAPILLARY: 102 mg/dL — AB (ref 65–99)
GLUCOSE-CAPILLARY: 188 mg/dL — AB (ref 65–99)
Glucose-Capillary: 200 mg/dL — ABNORMAL HIGH (ref 65–99)
Glucose-Capillary: 173 mg/dL — ABNORMAL HIGH (ref 65–99)

## 2015-10-14 LAB — TYPE AND SCREEN
ABO/RH(D): A POS
ANTIBODY SCREEN: NEGATIVE
UNIT DIVISION: 0

## 2015-10-14 LAB — HEPARIN LEVEL (UNFRACTIONATED): HEPARIN UNFRACTIONATED: 0.38 [IU]/mL (ref 0.30–0.70)

## 2015-10-14 MED ORDER — WARFARIN SODIUM 7.5 MG PO TABS
7.5000 mg | ORAL_TABLET | Freq: Once | ORAL | Status: AC
  Administered 2015-10-14: 7.5 mg via ORAL
  Filled 2015-10-14: qty 1

## 2015-10-14 MED ORDER — WARFARIN - PHARMACIST DOSING INPATIENT
Freq: Every day | Status: DC
  Administered 2015-10-17: 18:00:00

## 2015-10-14 NOTE — Progress Notes (Signed)
[place] TEAM 1 - Stepdown/ICU TEAM Progress Note  Briana [name] [id number] DOB: 1945/12/05 DOA: 09/30/2015 PCP: Antionette Fairy, PA-C  Admit HPI / Brief Narrative: Ms. Tardie is an 70 y.o. WF PMHx Depression with AnxietyDM Type 2 with complication (neuropathy), CAD native artery, PVD, Chronic Diastolic CHF, Heart murmur, OSA, HTN, HLD,Adenomatous colon polyp, CKD   Who presented to the ED at [facility] on the evening of 5/6 with 24 hours of chest pain. In ED found to have: hypoxia, a fever to 101.6, WBC of 15.5, EKG changes (lateral ST depression), mild troponin elevation (0.12). BNP was elevated to 894.0, previously was 391. She had a CXR which showed alveolar opacities, most dense in the Rt base. Cultures were obtained, she was started on CAP coverage, and due to the hypoxia was transferred to [facility] for tx of pneumonia.    HPI/Subjective: 5/20  A/O 4, negative CP, decreased SOB. Sitting in chair. Base weight is 102 kg (225 pounds). No HD today, unsure if she scheduled for HD on 5/20   Assessment/Plan: Acute hypoxic Respiratory Failure in setting of bilateral pulmonary infiltrates. PNA vs Pulmonary Edema -completed course of antibiotics 5 days  CAP -Completed 5 day course antibiotics  CAD native artery  Acute on chronic Diastolic CHF/Pulmonary Hypertension -Hold all anticoagulants secondary to possible GI bleed; -Strict in and out since admission + 66 ml -Daily weight Filed Weights   10/12/15 1842 10/13/15 0358 10/14/15 0300  Weight: 109.7 kg (241 lb 13.5 oz) 109.9 kg (242 lb 4.6 oz) 111.3 kg (245 lb 6 oz)  -Transfuse for hemoglobin<8 -5/9 Transfuse 1 unit PRBC -5/19 transfuse 1 unit PRBC  A-Fib RVR/RBBB(CHA2DS2 VASc score is 6+) - EKG shows A. fib RVR with RBBB -s/p DCCV 5/15  -See CHF - Cards following   HTN emergency w/ associated pulmonary edema  -Amiodarone 200 mg BID -Amlodipine 5 mg daily -Metoprolol 100 mg BID   Acute on CKD (baseline Cr~1.6 -2 )  on HD per nephrology -Renal US; nondiagnostic for increased failure Lab Results  Component Value Date   CREATININE 4.73* 10/14/2015   CREATININE 4.54* 10/13/2015   CREATININE 7.31* 10/12/2015  -5/20 nephrology has held on any further HD over weekend. Patient's UOP has increased, continue to monitor until nephrology makes final decision.   Acute blood loss anemia? -Hemoglobin stable  -hemoccult negative  Recent Labs     10/12/15  0455  10/13/15  0436  10/14/15  0315  HGB  8.1*  7.5*  8.1*   Leukocytosis -Resolved   DM Type 2 controlled with complication -123456 hemoglobin A1c= 6.9  -NovoLog 70/30  15 units BID -Resistant SSI  Depression -Zoloft 100 mg daily   Deconditioning -PT/OT, evaluate patient with new onset A. fib RVR with deconditioning for CIR vs SNF   Code Status: FULL Family Communication:  Disposition Plan: Resolution of A. fib RVR    Consultants: Dr.Rakesh Elsworth Soho PC [facility] [place] [facility]  Dr. Glenetta Hew [facility]iology    Procedure/Significant Events: CXR -- alveolar filling pattern, most dense in RLL. EKG - lateral ST depression, TWI throughout 5/7 Bilat LE Doppler; Negative DVT/SVT 5/8 echocardiogram;Left ventricle: mild focal basal hypertrophy of the septum. 55% to 60%. -(grade 3 diastolic dysfunction).-Left atrium: severely dilated. - Tricuspid valve: moderate regurgitation. - Pulmonary arteries: PA peak pressure: 69 mm Hg (S). 5/9 Transfuse 1 unit PRBC 5/11 renal ultrasound;-Low normal renal cortical thickness, a finding that may be seen with medical renal disease. . 5/12 RVR persists  5/15 TEE w/ DCCV > sinus bradycardia  -5/19 transfuse 1 unit PRBC  Culture Blood 5/6 left arm/right hand NGTD Urine 5/6 negative final 5/7 MRSA by PCR negative  Antibiotics: Azithromycin 5/6 >>5/10 Ceftriaxone 5/6 >> 5/11  DVT prophylaxis: Heparin drip  Devices    LINES / TUBES:     Continuous Infusions: . sodium chloride 10 mL/hr at  10/07/15 2214  . sodium chloride    . heparin 1,600 Units/hr (10/13/15 2018)    Objective: VITAL SIGNS: Temp: 98.1 F (36.7 C) (05/20 0727) Temp Source: Oral (05/20 0727) BP: 156/58 mmHg (05/20 0727) Pulse Rate: 53 (05/20 0727) SPO2; FIO2:   Intake/Output Summary (Last 24 hours) at 10/14/15 [id number] Last data filed at 10/14/15 0600  Gross per 24 hour  Intake   1041 ml  Output    650 ml  Net    391 ml     Exam: General: A/O 4, NAD sitting in chair comfortably, positive acute respiratory distress Eyes: negative scleral hemorrhage, negative icterus ENT: Negative Runny nose, negative gingival bleeding, Neck:  Negative scars, masses, torticollis, lymphadenopathy, JVD Lungs: diminished breath sounds, Positive crackles bilateral diffusely   Cardiac; regular rhythm and rate, negative murmurs rubs or gallops, normal S1/S2 Abdomen: Morbidly Obese, negative abdominal pain, nondistended, positive soft, bowel sounds, no rebound, no ascites, no appreciable mass Extremities: No significant cyanosis, clubbing, positive bilateral lower extremity edema to hips 2+ Psychiatric:  Negative depression, negative anxiety, negative fatigue, negative mania  Neurologic:  Cranial nerves II through XII intact, tongue/uvula midline, all extremities muscle strength 5/5, sensation intact throughout, negative dysarthria, negative expressive aphasia, negative receptive aphasia.    Data Reviewed: Basic Metabolic Panel:  Recent Labs Lab 10/10/15 0212 10/11/15 0322 10/12/15 0455 10/13/15 0434 10/14/15 0315  NA 135 135 133* 135 137  K 4.7 4.6 4.6 3.8 3.8  CL 102 101 99* 99* 99*  CO2 18* 17* 17* 25 25  GLUCOSE 163* 110* 97 144* 96  BUN 101* 111* 118* 62* 68*  CREATININE 5.32* 6.47* 7.31* 4.54* 4.73*  CALCIUM 8.3* 8.6* 8.5* 8.1* 8.0*  PHOS 8.4* 8.5* 9.0* 5.3* 5.5*   Liver Function Tests:  Recent Labs Lab 10/10/15 0212 10/11/15 0322 10/12/15 0455 10/13/15 0434 10/14/15 0315  ALBUMIN 2.6* 2.6*  2.5* 2.3* 2.6*   No results for input(s): LIPASE, AMYLASE in the last 168 hours. No results for input(s): AMMONIA in the last 168 hours. CBC:  Recent Labs Lab 10/10/15 0212 10/11/15 0330 10/12/15 0455 10/13/15 0436 10/14/15 0315  WBC 11.4* 11.8* 11.6* 9.0 8.9  HGB 8.1* 8.1* 8.1* 7.5* 8.1*  HCT 26.2* 26.5* 26.1* 23.8* 25.9*  MCV 85.9 85.5 84.5 83.2 83.8  PLT 288 294 304 240 217   Cardiac Enzymes:  Recent Labs Lab 10/11/15 0322  CKTOTAL 30*   BNP (last 3 results)  Recent Labs  05/31/15 1522 09/30/15 2140 10/02/15 2204  BNP 391.0* 894.0* 1262.5*    ProBNP (last 3 results) No results for input(s): PROBNP in the last 8760 hours.  CBG:  Recent Labs Lab 10/13/15 0848 10/13/15 1129 10/13/15 1639 10/13/15 2137 10/14/15 0726  GLUCAP 145* 257* 158* 131* 102*    No results found for this or any previous visit (from the past 240 hour(s)).   Studies:  Recent x-ray studies have been reviewed in detail by the Attending Physician  Scheduled Meds:  Scheduled Meds: . sodium chloride   Intravenous Once  . amiodarone  200 mg Oral Daily  . amLODipine  10 mg Oral Daily  .  aspirin EC  81 mg Oral Daily  . atorvastatin  80 mg Oral QPM  . calcium acetate  1,334 mg Oral TID WC  . insulin aspart  0-20 Units Subcutaneous TID WC  . insulin aspart  0-5 Units Subcutaneous QHS  . insulin aspart protamine- aspart  15 Units Subcutaneous BID WC  . isosorbide-hydrALAZINE  1 tablet Oral TID  . loratadine  10 mg Oral Daily  . metoprolol tartrate  25 mg Oral BID  . sertraline  100 mg Oral Daily  . sodium chloride flush  10-40 mL Intracatheter Q12H    Time spent on care of this patient: 40 mins   WOODS, Geraldo Docker , MD  [facility] Office  [phone] Pager - [phone]  On-Call/Text Page:      [email or web address]      password [email or web address]  If 7PM-7AM, please contact night-coverage [email or web address] Password [email or web address] 10/14/2015, 8:22 AM   LOS: 13 days   Care during the described  time interval was provided by me .  I have reviewed this patient's available data, including medical history, events of note, physical examination, and all test results as part of my evaluation. I have personally reviewed and interpreted all radiology studies.   Dia Crawford, MD [phone] Pager

## 2015-10-14 NOTE — Progress Notes (Addendum)
[place] for Heparin  Indication: afib  Allergies  Allergen Reactions  . Carbamazepine Other (See Comments)    Reaction to tegretol - loopy  . Clonazepam Other (See Comments)    Unknown reaction  . Gemfibrozil Other (See Comments)    Patient is not aware of this allergy but states that she has side effects to a cholesterol medication in the past  . Macrobid [Nitrofurantoin] Nausea And Vomiting  . Oxycodone Other (See Comments)    hallucinations    Patient Measurements: Height: 5\' 5"  (165.1 cm) Weight: 245 lb 6 oz (111.3 kg) IBW/kg (Calculated) : 57  Vital Signs: Temp: 99 F (37.2 C) (05/20 0300) Temp Source: Oral (05/20 0300) BP: 155/50 mmHg (05/20 0300) Pulse Rate: 51 (05/20 0300)  Labs:  Recent Labs  10/12/15 0455 10/13/15 0400 10/13/15 0434 10/13/15 0436 10/14/15 0315  HGB 8.1*  --   --  7.5* 8.1*  HCT 26.1*  --   --  23.8* 25.9*  PLT 304  --   --  240 217  HEPARINUNFRC 0.51 0.57  --   --  0.38  CREATININE 7.31*  --  4.54*  --  4.73*    Estimated Creatinine Clearance: 13.7 mL/min (by C-G formula based on Cr of 4.73).   Assessment: Heparin gtt for new onset AFib. Level remains therapeutic. Heparin held yesterday with drop in Hgb. Hgb remains low but stable - small trend up. No bleeding noted.  Goal of Therapy:  Heparin level 0.3-0.7 units/ml Monitor platelets by anticoagulation protocol: Yes   Plan:  - Continue heparin at 1600 units/hr - F/u daily heparin level and CBC   Sherlon Handing, PharmD, BCPS Clinical pharmacist, pager [phone] 10/14/2015 5:23 AM

## 2015-10-14 NOTE — Progress Notes (Signed)
ANTICOAGULATION CONSULT NOTE - Initial Consult  Pharmacy Consult for warfarin Indication: atrial fibrillation  Patient Measurements: Height: 5\' 5"  (165.1 cm) Weight: 245 lb 6 oz (111.3 kg) IBW/kg (Calculated) : 57  Vital Signs: Temp: 98.1 F (36.7 C) (05/20 0727) Temp Source: Oral (05/20 0727) BP: 151/47 mmHg (05/20 0918) Pulse Rate: 58 (05/20 0918)  Labs:  Recent Labs  10/12/15 0455 10/13/15 0400 10/13/15 0434 10/13/15 0436 10/14/15 0315  HGB 8.1*  --   --  7.5* 8.1*  HCT 26.1*  --   --  23.8* 25.9*  PLT 304  --   --  240 217  HEPARINUNFRC 0.51 0.57  --   --  0.38  CREATININE 7.31*  --  4.54*  --  4.73*    Estimated Creatinine Clearance: 13.7 mL/min (by C-G formula based on Cr of 4.73).   Assessment: Heparin gtt for new onset AFib now to transition to warfarin. Heparin held yesterday with drop in Hgb. Hgb remains low but stable - small trend up. No bleeding noted.   Goal of Therapy:  INR 2-3 Monitor platelets by anticoagulation protocol: Yes   Plan:  1) warfarin 7.5mg  *1 tonight 2) Daily INR   Melburn Popper, PharmD Clinical Pharmacy Resident Pager: [phone] 10/14/2015 10:28 AM

## 2015-10-14 NOTE — Progress Notes (Signed)
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 sodium chloride   Intravenous Once  . amiodarone  200 mg Oral Daily  . amLODipine  10 mg Oral Daily  . aspirin EC  81 mg Oral Daily  . atorvastatin  80 mg Oral QPM  . calcium acetate  1,334 mg Oral TID WC  . insulin aspart  0-20 Units Subcutaneous TID WC  . insulin aspart  0-5 Units Subcutaneous QHS  . insulin aspart protamine- aspart  15 Units Subcutaneous BID WC  . isosorbide-hydrALAZINE  1 tablet Oral TID  . loratadine  10 mg Oral Daily  . metoprolol tartrate  25 mg Oral BID  . sertraline  100 mg Oral Daily  . sodium chloride flush  10-40 mL Intracatheter Q12H   Continuous Infusions: . sodium chloride 10 mL/hr at 10/07/15 2214  . sodium chloride    . heparin 1,600 Units/hr (10/13/15 2018)   PRN Meds:.sodium chloride, sodium chloride, alteplase, heparin, heparin, HYDROcodone-acetaminophen, nitroGLYCERIN, sodium chloride flush, zolpidem  Current Labs: reviewed    Physical Exam:  Blood pressure 156/58, pulse 53, temperature 98.1 F (36.7 C), temperature source Oral, resp. rate 13, height 5\' 5"  (1.651 m), weight 111.3 kg (245 lb 6 oz), SpO2 97 %. GEN: NAD ENT: NCAT EYES: EOMI CV: RRR PULM: Diminished in bases ABD: s/nt/nd SKIN: no rashes/lesions EXT:3+ LEE R IJ Temp Cath  RENAL STUDIES 1. Renal US 5/11: negative for obstruction 2. Urine Sediment: numerous bacteria. Scattered brown pigmented granular casts. Occ monomorphic RBC no dysmorphic features. No cellular casts 3. Renal Doppler 5/17: no obvious arterial obstruction,  inc RIs 4. 5/17 CK neg 5. R IJ Temp Cath placed by PCCM 6. HD #1 5/18  Assessment  1. AoCKD3 2/2 ATN 1. Baseline 1.6-2.3 2. No obstruction or obvious nephrotoxins; duplex neg 3. Based on urine sediment with muddy brown casts, looks like has had ATN 4. iHD started 5/18  5. Very possible she is having recovery, she is not ESRD -- monitor now, inc UOP is suggestive 2. dCHF Exacerbation 3. CAP s/p Azithro and Ceftriaxone 4. AFib s/p DCCV 5/15, successful 5. HTN 6. Obesity 7. DM2 8. CAD s/p CABG, r/o ACS with Nuc ST: cardiology following  Plan 1. No HD today 2. Monitor UOP, labs in AM 3. Daily weights, Daily Renal Panel, Strict I/Os, Avoid nephrotoxins (NSAIDs, judicious IV Contrast)   Pearson Grippe MD 10/14/2015, 8:27 AM   Recent Labs Lab 10/12/15 0455 10/13/15 0434 10/14/15 0315  NA 133* 135 137  K 4.6 3.8 3.8  CL 99* 99* 99*  CO2 17* 25 25  GLUCOSE 97 144* 96  BUN 118* 62* 68*  CREATININE 7.31* 4.54* 4.73*  CALCIUM 8.5* 8.1* 8.0*  PHOS 9.0* 5.3* 5.5*    Recent Labs Lab 10/12/15 0455 10/13/15 0436 10/14/15 0315  WBC 11.6* 9.0 8.9  HGB 8.1* 7.5* 8.1*  HCT 26.1* 23.8* 25.9*  MCV 84.5 83.2 83.8  PLT 304 240 217

## 2015-10-14 NOTE — Progress Notes (Addendum)
HISTORY: Ms. Ruggerio is a 70 yo female with PMHx of CAD s/p CABG  In 2005, carotid artery stenosis, chronic diastolic CHF, OSA, HTN, HLD, and CKD who presented to [facility] ED on 5/6 with complaint of 24 hours of chest pain. Patient was found to be in acute respiratory failure with hypoxia likely secondary to CAP. Cardiology was consulted for chest pain on 5/7. EKG showed lateral ST depression with a mild troponin elevation at 0.12 (peak 2.13). Patient developed atrial fibrillation with RVR on 5/9 and is s/p successful cardioversion to sinus bradycardia on 5/15.  SUBJECTIVE:  Patient was seen and examined this morning.She is urinating well and denies any recurrent chest pain or pressure. Was up today with her daughter in the bathroom getting washed. No significant symptoms. Deconditioning.  OBJECTIVE:   Vitals:   Filed Vitals:   10/14/15 0727 10/14/15 0800 10/14/15 0900 10/14/15 0918  BP: 156/58   151/47  Pulse: 53 53 55 58  Temp: 98.1 F (36.7 C)     TempSrc: Oral     Resp: 13 20 17    Height:      Weight:      SpO2: 97% 92% 91%    I&O's:    Intake/Output Summary (Last 24 hours) at 10/14/15 [id number] Last data filed at 10/14/15 0900  Gross per 24 hour  Intake   1313 ml  Output    850 ml  Net    463 ml   TELEMETRY:   ECHO 10/02/15: Impressions: - The right ventricular systolic pressure was increased consistent  with severe pulmonary hypertension. PA peak pressure 69.  - EF 0000000, grade 3 diastolic dysfunction  PHYSICAL EXAM General: Vital signs reviewed.  Patient is elderly, in no acute distress and cooperative with exam.  Cardiovascular: Bradycardic, regular rhythm, S1 normal, S2 normal Pulmonary/Chest: Inspiratory crackles bilaterally, no wheezing or rhonchi. Abdominal: Soft, non-tender, non-distended, BS + Extremities: 1+ pitting edema in lower extremities to knees bilaterally  LABS: Basic Metabolic Panel:  Recent Labs  10/13/15 0434 10/14/15 0315  NA 135 137  K  3.8 3.8  CL 99* 99*  CO2 25 25  GLUCOSE 144* 96  BUN 62* 68*  CREATININE 4.54* 4.73*  CALCIUM 8.1* 8.0*  PHOS 5.3* 5.5*   Liver Function Tests:  Recent Labs  10/13/15 0434 10/14/15 0315  ALBUMIN 2.3* 2.6*   CBC:  Recent Labs  10/13/15 0436 10/14/15 0315  WBC 9.0 8.9  HGB 7.5* 8.1*  HCT 23.8* 25.9*  MCV 83.2 83.8  PLT 240 217   Coag Panel:   Lab Results  Component Value Date   INR 1.23 10/03/2015   INR 0.97 10/02/2009   TEE 10/09/15: - Left ventricle: Systolic function was normal. The estimated  ejection fraction was in the range of 55% to 60%. No evidence of  thrombus. - Aortic valve: No evidence of vegetation. There was mild  regurgitation. - Mitral valve: There was mild regurgitation. - Left atrium: The atrium was moderately to severely dilated. No  evidence of thrombus in the atrial cavity or appendage. No  evidence of thrombus in the atrial cavity or appendage. No  evidence of thrombus in the atrial cavity or appendage. - Atrial septum: No defect or patent foramen ovale was identified  by color flow Doppler. - Tricuspid valve: There was moderate regurgitation.  RADIOLOGY: US Carotid Duplex Bilateral  09/29/2015  CLINICAL DATA:  Bilateral carotid artery stenosis. EXAM: BILATERAL CAROTID DUPLEX ULTRASOUND TECHNIQUE: Pearline Cables scale imaging, color Doppler and duplex  ultrasound were performed of bilateral carotid and vertebral arteries in the neck. COMPARISON:  11/24/2014. FINDINGS: Criteria: Quantification of carotid stenosis is based on velocity parameters that correlate the residual internal carotid diameter with NASCET-based stenosis levels, using the diameter of the distal internal carotid lumen as the denominator for stenosis measurement. The following velocity measurements were obtained: RIGHT ICA:  113/16 cm/sec CCA:  123456 cm/sec SYSTOLIC ICA/CCA RATIO:  1.0 DIASTOLIC ICA/CCA RATIO:  1.5 ECA:  128 cm/sec LEFT ICA:  393/82 cm/sec CCA:  [id number] cm/sec SYSTOLIC  ICA/CCA RATIO:  4.6 DIASTOLIC ICA/CCA RATIO:  5.8 ECA:  160 cm/sec RIGHT CAROTID ARTERY: Mild to moderate right carotid bifurcation atherosclerotic vascular disease. No flow limiting stenosis. Degree of stenosis less than 50%. RIGHT VERTEBRAL ARTERY:  Patent with antegrade flow. LEFT CAROTID ARTERY: Prominent left carotid bifurcation and proximal ICA atherosclerotic vascular plaque. Degree of stenosis greater than 70%. LEFT VERTEBRAL ARTERY:  Patent antegrade flow. IMPRESSION: 1. Greater than 70 degrees stenosis left carotid bifurcation and proximal ICA. Vascular surgery consultation suggested. 2. Atherosclerotic vascular plaque right carotid bifurcation. Degree of stenosis less than 50%. Vertebral arteries are patent antegrade flow. Electronically Signed   By: Marcello Moores  Register   On: 09/29/2015 13:15   Nm Myocar Multi W/spect W/wall Motion / Ef  10/10/2015  CLINICAL DATA:  70 year old with chest pain. EXAM: MYOCARDIAL IMAGING WITH SPECT (REST AND PHARMACOLOGIC-STRESS) GATED LEFT VENTRICULAR WALL MOTION STUDY LEFT VENTRICULAR EJECTION FRACTION TECHNIQUE: Standard myocardial SPECT imaging was performed after resting intravenous injection of 10 mCi Tc-34m tetrofosmin. Subsequently, intravenous infusion of Lexiscan was performed under the supervision of the Cardiology staff. At peak effect of the drug, 30 mCi Tc-39m tetrofosmin was injected intravenously and standard myocardial SPECT imaging was performed. Quantitative gated imaging was also performed to evaluate left ventricular wall motion, and estimate left ventricular ejection fraction. COMPARISON:  Chest radiograph 10/05/2015 FINDINGS: Perfusion: There are fixed defects involving the anterior, anteroseptal and lateral walls. These findings are compatible with areas of infarct. In addition, there is concern for mild reversibility in the mid segments of the anteroseptal wall and anterior wall. These areas are concerning for peri-infarct ischemia. Wall Motion:  Dyskinesia at the apex. Left Ventricular Ejection Fraction: 45 % End diastolic volume [id number] End systolic volume 68 ml IMPRESSION: 1. Concern for mild peri-infarct ischemia involving the anterior wall and anteroseptal wall. Evidence for infarcts involving the anterior and lateral walls. 2. Dyskinesia at the apex. 3. Left ventricular ejection fraction is 45%. 4. Non invasive risk stratification*: Intermediate *2012 Appropriate Use Criteria for Coronary Revascularization Focused Update: J Am Coll Cardiol. [id number]. [email or web address] Electronically Signed   By: Markus Daft M.D.   On: 10/10/2015 17:08   US Renal Port  10/05/2015  CLINICAL DATA:  Chronic renal failure with acute exacerbation EXAM: RENAL ULTRASOUND COMPARISON:  September 05, 2008 FINDINGS: Right Kidney: Length: 11.2 cm. Echogenicity within normal limits. Renal cortical thickness is low normal. No mass, perinephric fluid, or hydronephrosis visualized. No sonographically demonstrable calculus or ureterectasis. Left Kidney: Length: 11.4 cm. Echogenicity within normal limits. Renal cortical thickness is low normal. No mass, perinephric fluid, or hydronephrosis visualized. No sonographically demonstrable calculus or ureterectasis. Bladder: Urinary bladder is decompressed with a Foley catheter and cannot be assessed. IMPRESSION: Low normal renal cortical thickness, a finding that may be seen with medical renal disease. No increase in renal echogenicity or evidence of obstructing focus on either side. Study otherwise unremarkable. Electronically Signed   By: Lowella Grip III M.D.  On: 10/05/2015 17:47   Dg Chest Port 1 View  10/11/2015  CLINICAL DATA:  Central line placement. EXAM: PORTABLE CHEST 1 VIEW COMPARISON:  10/05/2015. FINDINGS: 1819 hours. Right IJ central venous catheter has been placed, extending to the mid SVC level. The heart size and mediastinal contours are stable status post CABG.  Pulmonary edema appears mildly improved. No pneumothorax or significant pleural effusion identified. IMPRESSION: Central line extends to the mid SVC level. No evidence of pneumothorax. Mild improvement in pulmonary edema. Electronically Signed   By: Richardean Sale M.D.   On: 10/11/2015 18:31   Dg Chest Port 1 View  10/05/2015  CLINICAL DATA:  Pulmonary edema, history of coronary artery disease, CHF, CABG. Acute respiratory failure with hypoxia. EXAM: PORTABLE CHEST 1 VIEW COMPARISON:  Portable chest x-ray of Oct 04, 2015 FINDINGS: The lungs are well-expanded. The interstitial markings remain increased. The left hemidiaphragm is slightly better demonstrated today but increased retrocardiac density persists. The cardiac silhouette remains enlarged. The pulmonary vascularity is slightly less engorged. There are post CABG changes. IMPRESSION: CHF superimposed upon COPD. Pulmonary interstitial and mild alveolar edema, slightly improved. Left basilar atelectasis or pneumonia and small left pleural effusion. Electronically Signed   By: David  Martinique M.D.   On: 10/05/2015 07:25   Dg Chest Port 1 View  10/04/2015  CLINICAL DATA:  Hypoxia.  Dyspnea EXAM: PORTABLE CHEST 1 VIEW COMPARISON:  10/03/2015 FINDINGS: Airspace opacities in the central and basilar regions have worsened from the earlier study. Probable left pleural effusion. Moderate vascular and interstitial congestion. Moderate cardiomegaly. IMPRESSION: The findings likely represent congestive heart failure. Infectious infiltrates cannot be excluded. Electronically Signed   By: Andreas Newport M.D.   On: 10/04/2015 01:39   Dg Chest Port 1 View  10/03/2015  CLINICAL DATA:  Shortness of breath EXAM: PORTABLE CHEST 1 VIEW COMPARISON:  10/02/2015 FINDINGS: Cardiomegaly again noted. Status post CABG. Patchy airspace disease bilaterally especially lower lobes again noted. Probable small left pleural effusion with left basilar atelectasis or infiltrate. Central  mild vascular congestion without convincing pulmonary edema. IMPRESSION: Patchy airspace disease bilaterally especially lower lobes again noted. Probable small left pleural effusion and left basilar atelectasis or infiltrate. Central mild vascular congestion without convincing pulmonary edema. Electronically Signed   By: Lahoma Crocker M.D.   On: 10/03/2015 08:37   Dg Chest Port 1 View  10/02/2015  CLINICAL DATA:  Acute respiratory failure. EXAM: PORTABLE CHEST 1 VIEW COMPARISON:  10/01/2015 FINDINGS: Airspace opacities persist throughout the central and basilar regions of both lungs, without significant interval change. No pneumothorax. Unchanged cardiomegaly. IMPRESSION: No significant interval change in the bilateral airspace opacities. Electronically Signed   By: Andreas Newport M.D.   On: 10/02/2015 23:10   Dg Chest Port 1 View  10/01/2015  CLINICAL DATA:  Initial evaluation for acute chest pain. EXAM: PORTABLE CHEST 1 VIEW COMPARISON:  Prior radiograph from 09/30/2015. FINDINGS: Median sternotomy wires with underlying CABG markers again noted. Cardiomegaly stable. Atheromatous plaque within the aortic arch. Lungs mildly hypoinflated with elevation left hemidiaphragm. Interval worsening and patchy right perihilar and basilar opacity, worrisome for progressive pneumonia. Additional patchy opacity at the left lung base may reflect atelectasis or infiltrate. Mild vascular congestion without overt pulmonary edema. No definite pleural effusion, although the right costophrenic angle is incompletely visualized. No pneumothorax. No acute osseus abnormality. IMPRESSION: 1. Interval worsening in patchy right perihilar and basilar opacity, worrisome for progressive pneumonia. 2. Elevation of the left hemidiaphragm with additional patchy left basilar patchy  opacity, which may reflect atelectasis or additional infiltrate. 3. Stable cardiomegaly with mild pulmonary vascular congestion without overt pulmonary edema.  Electronically Signed   By: Jeannine Boga M.D.   On: 10/01/2015 05:15   Dg Chest Port 1 View  09/30/2015  CLINICAL DATA:  Dyspnea, cough, fever. EXAM: PORTABLE CHEST 1 VIEW COMPARISON:  05/31/2015 FINDINGS: There is unchanged moderate cardiomegaly. There is patchy right base opacity which could represent pneumonia. No large effusion. IMPRESSION: Patchy right lung base opacity, potentially pneumonia although hemorrhage or asymmetric edema could also produce this appearance. Electronically Signed   By: Andreas Newport M.D.   On: 09/30/2015 22:33   ASSESSMENT/PLAN:   Ms. Stocks is a 70 yo female with PMHx of CAD s/p CABG  In 2005, carotid artery stenosis, chronic diastolic CHF who presented to [facility] ED on 5/6 with complaint of 24 hours of chest pain and found to be in acute respiratory failure with hypoxia likely secondary to CAP. Cardiology was consulted for chest pain and new onset afib with RVR. Also had elevated troponin consistent with demand ischemia/non-ST elevation myocardial infarction given elevation to 2.13 in the setting of underlying medical illness.  Non-ST elevation MI/CAD s/p CABG: Myoview on 5/17 was intermediate risk and showed an anterior defect with concern for per-infarct ischemia. Patient would benefit from a cardiac cath at some point; however, her worsening renal function makes timing difficult as we are hoping for improvement from a renal standpoint. Cath will likely be put off until kidney function recovers, I agree with Dr. Ellyn Hack that since she is chest pain-free currently and her troponin is trending down, I would continue with medical management for now and post bone potential cardiac catheterization until outpatient setting unless symptoms worsen. Troponin peak 2.13 on 10/01/15, currently trending downward 0.60 -ASA 81 mg daily -Atorvastatin 80 mg daily  -Metoprolol 25 mg BID -Bidil 20-37.5 1 tablet TID -Amlodipine 10 mg daily  Paroxysmal atrial  fibrillation/Sinus Bradycardia: New onset afib s/p TEE with successful cardioversion on 5/15 into sinus bradycardia. HR 40-50s, decreased metoprolol to 25 mg BID. Hopefully with decrease in amiodarone, will be able to titrate BB back up. -Will transition to coumadin as was suggested by Dr. Ellyn Hack on 10/13/15 -Amiodarone 200  daily now -Metoprolol 25 mg BID- consider increase as able  HTN: Hypertensive. -Decreased Metoprolol to 25 mg BID- consider increase as able -Bidil 20-37.5 1 tablet TID -Increased Amlodipine 10 mg daily   Chronic HFpEF:  Myoview showed EF of 45%. Previous echo 55-60% with grade 3 diastolic dysfunction.  -Metoprolol 25 mg BID -Bidil 20-37.5 1 tablet TID -Amlodipine 10 mg daily -Holding home lasix due to worsening renal function -Monitor fluid status and renal function  Acute on Chronic CKD III: Likely 2/2 ATN. Creatinine baseline 2.1, worsening this admission. Patient was started on iHD yesterday given her creatinine >7 this morning, BUN 118, acidemia and hyponatremia. Plans are to monitor for renal recovery. UOP 595 yesterday. No HD today per Nephrology.  -Nephrology following, likely ATN, muddy casts -Holding Lasix  Normocytic Anemia: Hgb 8.1 from 7.5 after one unit PRBC on 10/13/15. It appears there was an initially drop at admission from 10 to 8. Unclear cause of drop, concern for bleeding.  - FOBT pending  Hep B Surface Ab Negative: Not immune, could consider revaccination at some point.  DVT/PE ppx: Heparin gtt (transiently held because of drop in hemoglobin, now restarted) Please disregard accidental discontinuation of heparin IV drip, we are continuing with Coumadin.  Elta Guadeloupe  Marlou Porch, MD   10/14/2015 9:28 AM

## 2015-10-15 ENCOUNTER — Other Ambulatory Visit: Payer: Self-pay | Admitting: "Endocrinology

## 2015-10-15 LAB — CBC
HCT: 26.1 % — ABNORMAL LOW (ref 36.0–46.0)
HEMOGLOBIN: 8.1 g/dL — AB (ref 12.0–15.0)
MCH: 26 pg (ref 26.0–34.0)
MCHC: 31 g/dL (ref 30.0–36.0)
MCV: 83.9 fL (ref 78.0–100.0)
PLATELETS: 213 10*3/uL (ref 150–400)
RBC: 3.11 MIL/uL — AB (ref 3.87–5.11)
RDW: 15.2 % (ref 11.5–15.5)
WBC: 9.3 10*3/uL (ref 4.0–10.5)

## 2015-10-15 LAB — RENAL FUNCTION PANEL
ANION GAP: 11 (ref 5–15)
Albumin: 2.6 g/dL — ABNORMAL LOW (ref 3.5–5.0)
BUN: 68 mg/dL — ABNORMAL HIGH (ref 6–20)
CALCIUM: 8.3 mg/dL — AB (ref 8.9–10.3)
CHLORIDE: 102 mmol/L (ref 101–111)
CO2: 24 mmol/L (ref 22–32)
Creatinine, Ser: 4.31 mg/dL — ABNORMAL HIGH (ref 0.44–1.00)
GFR calc Af Amer: 11 mL/min — ABNORMAL LOW (ref >60)
GFR calc non Af Amer: 10 mL/min — ABNORMAL LOW (ref >60)
GLUCOSE: 129 mg/dL — AB (ref 65–99)
Phosphorus: 4.3 mg/dL (ref 2.5–4.6)
Potassium: 4 mmol/L (ref 3.5–5.1)
SODIUM: 137 mmol/L (ref 135–145)

## 2015-10-15 LAB — GLUCOSE, CAPILLARY
GLUCOSE-CAPILLARY: 119 mg/dL — AB (ref 65–99)
GLUCOSE-CAPILLARY: 175 mg/dL — AB (ref 65–99)
Glucose-Capillary: 165 mg/dL — ABNORMAL HIGH (ref 65–99)
Glucose-Capillary: 152 mg/dL — ABNORMAL HIGH (ref 65–99)
Glucose-Capillary: 113 mg/dL — ABNORMAL HIGH (ref 65–99)

## 2015-10-15 LAB — HEPARIN LEVEL (UNFRACTIONATED)
Heparin Unfractionated: <0.1 IU/mL — ABNORMAL LOW (ref 0.30–0.70)
Heparin Unfractionated: 0.2 IU/mL — ABNORMAL LOW (ref 0.30–0.70)

## 2015-10-15 LAB — PROTIME-INR
INR: 1.23 (ref 0.00–1.49)
PROTHROMBIN TIME: 15.6 s — AB (ref 11.6–15.2)

## 2015-10-15 MED ORDER — HEPARIN (PORCINE) IN NACL 100-0.45 UNIT/ML-% IJ SOLN
1700.0000 [IU]/h | INTRAMUSCULAR | Status: DC
  Administered 2015-10-15: 1600 [IU]/h via INTRAVENOUS
  Administered 2015-10-16 – 2015-10-17 (×2): 1750 [IU]/h via INTRAVENOUS
  Administered 2015-10-18: 1700 [IU]/h via INTRAVENOUS
  Filled 2015-10-15 (×6): qty 250

## 2015-10-15 MED ORDER — WARFARIN SODIUM 7.5 MG PO TABS
7.5000 mg | ORAL_TABLET | Freq: Once | ORAL | Status: AC
  Administered 2015-10-15: 7.5 mg via ORAL
  Filled 2015-10-15: qty 1

## 2015-10-15 NOTE — Progress Notes (Signed)
ANTICOAGULATION CONSULT NOTE - Follow Up Consult  Pharmacy Consult for heparin Indication: atrial fibrillation  Allergies  Allergen Reactions  . Carbamazepine Other (See Comments)    Reaction to tegretol - loopy  . Clonazepam Other (See Comments)    Unknown reaction  . Gemfibrozil Other (See Comments)    Patient is not aware of this allergy but states that she has side effects to a cholesterol medication in the past  . Macrobid [Nitrofurantoin] Nausea And Vomiting  . Oxycodone Other (See Comments)    hallucinations    Patient Measurements: Height: 5\' 5"  (165.1 cm) Weight: 242 lb 3.2 oz (109.861 kg) IBW/kg (Calculated) : 57 Heparin Dosing Weight: 80 kg  Vital Signs: Temp: 98.1 F (36.7 C) (05/21 1247) Temp Source: Oral (05/21 1247) BP: 171/51 mmHg (05/21 1247) Pulse Rate: 54 (05/21 1247)  Labs:  Recent Labs  10/13/15 0434  10/13/15 0436 10/14/15 0315 10/15/15 0415 10/15/15 1441  HGB  --   < > 7.5* 8.1* 8.1*  --   HCT  --   --  23.8* 25.9* 26.1*  --   PLT  --   --  240 217 213  --   LABPROT  --   --   --   --  15.6*  --   INR  --   --   --   --  1.23  --   HEPARINUNFRC  --   --   --  0.38 <0.10* 0.20*  CREATININE 4.54*  --   --  4.73* 4.31*  --   < > = values in this interval not displayed.  Estimated Creatinine Clearance: 15 mL/min (by C-G formula based on Cr of 4.31).  Assessment:  70 y/o F for heparin bridge to coumadin for new onset AFib. Heparin gtt restarted after being inadvertently discontinued. HL 0.20. CBC stable.   Goal of Therapy:  Heparin level 0.3-0.7 units/ml Monitor platelets by anticoagulation protocol: Yes   Plan:  Inc heparin gtt to 1750 units/hr 8hr HL Daily HL, CBC Monitor for S&S of bleed  Angela Burke, PharmD Pharmacy Resident Pager: [phone] 10/15/2015,3:19 PM

## 2015-10-15 NOTE — Progress Notes (Signed)
Pt ate poorly for lunch, ate very little for dinner. Was to receive 70/30 at dinner time. Informed Dr Sherral Hammers of poor apetite. 70/30 insulin held at dinnetime

## 2015-10-15 NOTE — Discharge Instructions (Signed)

## 2015-10-15 NOTE — Progress Notes (Signed)
[facility] TEAM 1 - Stepdown/ICU TEAM Progress Note  JAUNITA MIKELS HUT:[id number] DOB: 1945-06-21 DOA: 09/30/2015 PCP: Antionette Fairy, PA-C  Admit HPI / Brief Narrative: Briana [name] is an 70 y.o. 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 In ED found to have: hypoxia, a fever to 101.6, WBC of 15.5, EKG changes (lateral ST depression), mild troponin elevation (0.12). BNP was elevated to 894.0, previously was 391. She had a CXR which showed alveolar opacities, most dense in the Rt base. Cultures were obtained, she was started on CAP coverage, and due to the hypoxia was transferred to [facility] for tx of pneumonia.    HPI/Subjective: 5/21  A/O 4, negative CP, decreased SOB. Negative N/V. Base weight is 102 kg (225 pounds). Kidney function improving, nephrology to decide on 5/22 if any further HD required   Assessment/Plan: Acute hypoxic Respiratory Failure in setting of bilateral pulmonary infiltrates. PNA vs Pulmonary Edema -completed course of antibiotics 5 days  CAP -Completed 5 day course antibiotics  CAD native artery  Acute on chronic Diastolic CHF/Pulmonary Hypertension -Hold all anticoagulants secondary to possible GI bleed; -Strict in and out since admission -38m -Daily weight Filed Weights   10/14/15 0300 10/15/15 0441 10/15/15 1247  Weight: 111.3 kg (245 lb 6 oz) 110.8 kg (244 lb 4.3 oz) 109.861 kg (242 lb 3.2 oz)  -Transfuse for hemoglobin<8 -5/9 Transfuse 1 unit PRBC -5/19 transfuse 1 unit PRBC  A-Fib RVR/RBBB(CHA2DS2 VASc score is 6+) - EKG shows A. fib RVR with RBBB -s/p DCCV 5/15  -See CHF - Cards following   HTN emergency w/ associated pulmonary edema  -Amiodarone 200 mg Daily -Amlodipine 10 mg daily -Bidil 20-30 7.5 mg one tablet  TID -Metoprolol 25 mg BID   Acute on CKD (baseline Cr~1.6 -2 ) on HD per nephrology -Renal [place] nondiagnostic for increased failure Lab Results  Component Value Date   CREATININE 4.31* 10/15/2015   CREATININE 4.73* 10/14/2015   CREATININE 4.54* 10/13/2015  -5/20 nephrology has held on any further HD over weekend. Patient's UOP has increased, continue to monitor until nephrology makes final decision.   Acute blood loss anemia? -Hemoglobin stable  -hemoccult negative  Recent Labs     10/13/15  0436  10/14/15  0315  10/15/15  0415  HGB  7.5*  8.1*  8.1*   Leukocytosis -Resolved   DM Type 2 controlled with complication -46/56hemoglobin A1c= 6.9  -NovoLog 70/30  15 units BID -Resistant SSI  Depression -Zoloft 100 mg daily   [facility] appears will reevaluate on Monday for appropriateness for CIR   Code Status: FULL Family Communication:  Disposition Plan: Resolution of A. fib RVR, renal failure-->> CIR    Consultants: Dr.Rakesh AHenderson CloudCM [facility]  Dr. DGlenetta HewCardiology    Procedure/Significant Events: CXR -- alveolar filling pattern, most dense in RLL. EKG - lateral ST depression, TWI throughout 5/7 Bilat LE Doppler; Negative DVT/SVT 5/8 echocardiogram;Left ventricle: mild focal basal hypertrophy of the septum. 55% to 60%. -(grade 3 diastolic dysfunction).-Left atrium: severely dilated. - Tricuspid valve: moderate regurgitation. - Pulmonary arteries: PA peak pressure: 69 mm Hg (S). 5/9 Transfuse 1 unit PRBC 5/11 renal ultrasound;-Low normal renal cortical thickness, a finding  that may be seen with medical renal disease. . 5/12 RVR persists  5/15 TEE w/ DCCV > sinus bradycardia  -5/19 transfuse 1 unit PRBC  Culture Blood 5/6 left arm/right hand NGTD Urine 5/6 negative final 5/7 MRSA by PCR negative  Antibiotics: Azithromycin 5/6 >>5/10 Ceftriaxone 5/6 >> 5/11  DVT  prophylaxis: Heparin drip  Devices    LINES / TUBES:     Continuous Infusions: . sodium chloride 10 mL/hr at 10/07/15 2214  . heparin 1,750 Units/hr (10/15/15 1524)    Objective: VITAL SIGNS: Temp: 98.1 F (36.7 C) (05/21 1247) Temp Source: Oral (05/21 1247) BP: 171/51 mmHg (05/21 1247) Pulse Rate: 54 (05/21 1247) SPO2; FIO2:   Intake/Output Summary (Last 24 hours) at 10/15/15 1759 Last data filed at 10/15/15 1100  Gross per 24 hour  Intake 931.87 ml  Output   1200 ml  Net -268.13 ml     Exam: General: A/O 4, NAD Laying in bed comfortably, negative acute respiratory distress Eyes: negative scleral hemorrhage, negative icterus ENT: Negative Runny nose, negative gingival bleeding, Neck:  Negative scars, masses, torticollis, lymphadenopathy, JVD Lungs: clear to auscultation bilateral    Cardiac; regular rhythm and rate, negative murmurs rubs or gallops, normal S1/S2 Abdomen: Morbidly Obese, negative abdominal pain, nondistended, positive soft, bowel sounds, no rebound, no ascites, no appreciable mass Extremities: No significant cyanosis, clubbing, positive bilateral lower extremity edema to hips 2+ Psychiatric:  Negative depression, negative anxiety, negative fatigue, negative mania  Neurologic:  Cranial nerves II through XII intact, tongue/uvula midline, all extremities muscle strength 5/5, sensation intact throughout, negative dysarthria, negative expressive aphasia, negative receptive aphasia.    Data Reviewed: Basic Metabolic Panel:  Recent Labs Lab 10/11/15 0322 10/12/15 0455 10/13/15 0434 10/14/15 0315 10/15/15 0415  NA 135 133* 135 137 137  K 4.6 4.6 3.8 3.8 4.0  CL 101 99* 99* 99* 102  CO2 17* 17* _0 GLUCOSE 110* 97 144* 96 129*  BUN 111* 118* 62* 68* 68*  CREATININE 6.47* 7.31* 4.54* 4.73* 4.31*  CALCIUM 8.6* 8.5* 8.1* 8.0* 8.3*  PHOS 8.5* 9.0* 5.3* 5.5* 4.3   Liver Function Tests:  Recent Labs Lab 10/11/15 0322 10/12/15 0455  10/13/15 0434 10/14/15 0315 10/15/15 0415  ALBUMIN 2.6* 2.5* 2.3* 2.6* 2.6*   No results for input(s): LIPASE, AMYLASE in the last 168 hours. No results for input(s): AMMONIA in the last 168 hours. CBC:  Recent Labs Lab 10/11/15 0330 10/12/15 0455 10/13/15 0436 10/14/15 0315 10/15/15 0415  WBC 11.8* 11.6* 9.0 8.9 9.3  HGB 8.1* 8.1* 7.5* 8.1* 8.1*  HCT 26.5* 26.1* 23.8* 25.9* 26.1*  MCV 85.5 84.5 83.2 83.8 83.9  PLT 294 304 240 217 213   Cardiac Enzymes:  Recent Labs Lab 10/11/15 0322  CKTOTAL 30*   BNP (last 3 results)  Recent Labs  05/31/15 1522 09/30/15 2140 10/02/15 2204  BNP 391.0* 894.0* 1262.5*    ProBNP (last 3 results) No results for input(s): PROBNP in the last 8760 hours.  CBG:  Recent Labs Lab 10/14/15 2115 10/15/15 0757 10/15/15 1117 10/15/15 1208 10/15/15 1631  GLUCAP 173* 119* 165* 152* 175*    No results found for this or any previous visit (from the past 240 hour(s)).   Studies:  Recent x-ray studies have been reviewed in detail by the Attending Physician  Scheduled Meds:  Scheduled Meds: . sodium chloride   Intravenous Once  . amiodarone  200 mg Oral Daily  . amLODipine  10 mg Oral Daily  .  aspirin EC  81 mg Oral Daily  . atorvastatin  80 mg Oral QPM  . calcium acetate  1,334 mg Oral TID WC  . insulin aspart  0-20 Units Subcutaneous TID WC  . insulin aspart  0-5 Units Subcutaneous QHS  . insulin aspart protamine- aspart  15 Units Subcutaneous BID WC  . isosorbide-hydrALAZINE  1 tablet Oral TID  . loratadine  10 mg Oral Daily  . metoprolol tartrate  25 mg Oral BID  . sertraline  100 mg Oral Daily  . sodium chloride flush  10-40 mL Intracatheter Q12H  . Warfarin - Pharmacist Dosing Inpatient   Does not apply q1800    Time spent on care of this patient: 40 mins   WOODS, Geraldo Docker , MD  [facility] Office  [phone] Pager - [phone]  On-Call/Text Page:      [email or web address]      password [email or web address]  If 7PM-7AM,  please contact night-coverage [email or web address] Password [email or web address] 10/15/2015, 5:59 PM   LOS: 14 days   Care during the described time interval was provided by me .  I have reviewed this patient's available data, including medical history, events of note, physical examination, and all test results as part of my evaluation. I have personally reviewed and interpreted all radiology studies.   Dia Crawford, MD [phone] Pager

## 2015-10-15 NOTE — Progress Notes (Addendum)
HISTORY: Briana [name] is a 70 yo female with PMHx of CAD s/p CABG in 2005, carotid artery stenosis, chronic diastolic CHF, OSA, HTN, HLD, and CKD who presented to [facility] ED on 5/6 with complaint of 24 hours of chest pain. Patient was found to be in acute respiratory failure with hypoxia likely secondary to CAP. Cardiology was consulted for chest pain on 5/7. EKG showed lateral ST depression with a mild troponin elevation at 0.12 (peak 2.13). Patient developed atrial fibrillation with RVR on 5/9 and is s/p successful cardioversion to sinus bradycardia on 5/15.  SUBJECTIVE:   She is urinating well and denies any recurrent chest pain or pressure. No significant symptoms. Deconditioning.  OBJECTIVE:   Vitals:   Filed Vitals:   10/15/15 0441 10/15/15 0753 10/15/15 0800 10/15/15 0937  BP: 154/46 168/51  164/46  Pulse:  50 50 56  Temp: 98.7 F (37.1 C) 98.4 F (36.9 C)    TempSrc: Oral Oral    Resp: 15 14 17    Height:      Weight: 244 lb 4.3 oz (110.8 kg)     SpO2: 94% 94% 95%    I&O's:    Intake/Output Summary (Last 24 hours) at 10/15/15 0955 Last data filed at 10/15/15 0900  Gross per 24 hour  Intake 1104.94 ml  Output   1525 ml  Net -420.06 ml   TELEMETRY: PAT brief 10sec. NSR  ECHO 10/02/15: Impressions: - The right ventricular systolic pressure was increased consistent  with severe pulmonary hypertension. PA peak pressure 69.  - EF 0000000, grade 3 diastolic dysfunction  PHYSICAL EXAM General: Vital signs reviewed.  Patient is elderly, in no acute distress and cooperative with exam.  Cardiovascular: Bradycardic, regular rhythm, S1 normal, S2 normal Pulmonary/Chest: Diminished bilaterally, no wheezing or rhonchi. Abdominal: Soft, non-tender, non-distended, BS + Extremities: 2-3+ pitting edema in lower extremities to knees bilaterally  LABS: Basic Metabolic Panel:  Recent Labs  10/14/15 0315 10/15/15 0415  NA 137 137  K 3.8 4.0  CL 99* 102  CO2 25 24  GLUCOSE  96 129*  BUN 68* 68*  CREATININE 4.73* 4.31*  CALCIUM 8.0* 8.3*  PHOS 5.5* 4.3   Liver Function Tests:  Recent Labs  10/14/15 0315 10/15/15 0415  ALBUMIN 2.6* 2.6*   CBC:  Recent Labs  10/14/15 0315 10/15/15 0415  WBC 8.9 9.3  HGB 8.1* 8.1*  HCT 25.9* 26.1*  MCV 83.8 83.9  PLT 217 213   Coag Panel:   Lab Results  Component Value Date   INR 1.23 10/15/2015   INR 1.23 10/03/2015   INR 0.97 10/02/2009   TEE 10/09/15: - Left ventricle: Systolic function was normal. The estimated  ejection fraction was in the range of 55% to 60%. No evidence of  thrombus. - Aortic valve: No evidence of vegetation. There was mild  regurgitation. - Mitral valve: There was mild regurgitation. - Left atrium: The atrium was moderately to severely dilated. No  evidence of thrombus in the atrial cavity or appendage. No  evidence of thrombus in the atrial cavity or appendage. No  evidence of thrombus in the atrial cavity or appendage. - Atrial septum: No defect or patent foramen ovale was identified  by color flow Doppler. - Tricuspid valve: There was moderate regurgitation.  RADIOLOGY: US Carotid Duplex Bilateral  09/29/2015  CLINICAL DATA:  Bilateral carotid artery stenosis. EXAM: BILATERAL CAROTID DUPLEX ULTRASOUND TECHNIQUE: Pearline Cables scale imaging, color Doppler and duplex ultrasound were performed of bilateral carotid and vertebral  arteries in the neck. COMPARISON:  11/24/2014. FINDINGS: Criteria: Quantification of carotid stenosis is based on velocity parameters that correlate the residual internal carotid diameter with NASCET-based stenosis levels, using the diameter of the distal internal carotid lumen as the denominator for stenosis measurement. The following velocity measurements were obtained: RIGHT ICA:  113/16 cm/sec CCA:  123456 cm/sec SYSTOLIC ICA/CCA RATIO:  1.0 DIASTOLIC ICA/CCA RATIO:  1.5 ECA:  128 cm/sec LEFT ICA:  393/82 cm/sec CCA:  [id number] cm/sec SYSTOLIC ICA/CCA RATIO:  4.6  DIASTOLIC ICA/CCA RATIO:  5.8 ECA:  160 cm/sec RIGHT CAROTID ARTERY: Mild to moderate right carotid bifurcation atherosclerotic vascular disease. No flow limiting stenosis. Degree of stenosis less than 50%. RIGHT VERTEBRAL ARTERY:  Patent with antegrade flow. LEFT CAROTID ARTERY: Prominent left carotid bifurcation and proximal ICA atherosclerotic vascular plaque. Degree of stenosis greater than 70%. LEFT VERTEBRAL ARTERY:  Patent antegrade flow. IMPRESSION: 1. Greater than 70 degrees stenosis left carotid bifurcation and proximal ICA. Vascular surgery consultation suggested. 2. Atherosclerotic vascular plaque right carotid bifurcation. Degree of stenosis less than 50%. Vertebral arteries are patent antegrade flow. Electronically Signed   By: Marcello Moores  Register   On: 09/29/2015 13:15   Nm Myocar Multi W/spect W/wall Motion / Ef  10/10/2015  CLINICAL DATA:  70 year old with chest pain. EXAM: MYOCARDIAL IMAGING WITH SPECT (REST AND PHARMACOLOGIC-STRESS) GATED LEFT VENTRICULAR WALL MOTION STUDY LEFT VENTRICULAR EJECTION FRACTION TECHNIQUE: Standard myocardial SPECT imaging was performed after resting intravenous injection of 10 mCi Tc-38m tetrofosmin. Subsequently, intravenous infusion of Lexiscan was performed under the supervision of the Cardiology staff. At peak effect of the drug, 30 mCi Tc-84m tetrofosmin was injected intravenously and standard myocardial SPECT imaging was performed. Quantitative gated imaging was also performed to evaluate left ventricular wall motion, and estimate left ventricular ejection fraction. COMPARISON:  Chest radiograph 10/05/2015 FINDINGS: Perfusion: There are fixed defects involving the anterior, anteroseptal and lateral walls. These findings are compatible with areas of infarct. In addition, there is concern for mild reversibility in the mid segments of the anteroseptal wall and anterior wall. These areas are concerning for peri-infarct ischemia. Wall Motion: Dyskinesia at the apex.  Left Ventricular Ejection Fraction: 45 % End diastolic volume [id number] ml End systolic volume 68 ml IMPRESSION: 1. Concern for mild peri-infarct ischemia involving the anterior wall and anteroseptal wall. Evidence for infarcts involving the anterior and lateral walls. 2. Dyskinesia at the apex. 3. Left ventricular ejection fraction is 45%. 4. Non invasive risk stratification*: Intermediate *2012 Appropriate Use Criteria for Coronary Revascularization Focused Update: J Am Coll Cardiol. [id number]. [email or web address] Electronically Signed   By: Markus Daft M.D.   On: 10/10/2015 17:08   US Renal Port  10/05/2015  CLINICAL DATA:  Chronic renal failure with acute exacerbation EXAM: RENAL ULTRASOUND COMPARISON:  September 05, 2008 FINDINGS: Right Kidney: Length: 11.2 cm. Echogenicity within normal limits. Renal cortical thickness is low normal. No mass, perinephric fluid, or hydronephrosis visualized. No sonographically demonstrable calculus or ureterectasis. Left Kidney: Length: 11.4 cm. Echogenicity within normal limits. Renal cortical thickness is low normal. No mass, perinephric fluid, or hydronephrosis visualized. No sonographically demonstrable calculus or ureterectasis. Bladder: Urinary bladder is decompressed with a Foley catheter and cannot be assessed. IMPRESSION: Low normal renal cortical thickness, a finding that may be seen with medical renal disease. No increase in renal echogenicity or evidence of obstructing focus on either side. Study otherwise unremarkable. Electronically Signed   By: Lowella Grip III M.D.   On: 10/05/2015 17:47   Dg Chest [facility]  1 View  10/11/2015  CLINICAL DATA:  Central line placement. EXAM: PORTABLE CHEST 1 VIEW COMPARISON:  10/05/2015. FINDINGS: 1819 hours. Right IJ central venous catheter has been placed, extending to the mid SVC level. The heart size and mediastinal contours are stable status post CABG. Pulmonary edema appears  mildly improved. No pneumothorax or significant pleural effusion identified. IMPRESSION: Central line extends to the mid SVC level. No evidence of pneumothorax. Mild improvement in pulmonary edema. Electronically Signed   By: Richardean Sale M.D.   On: 10/11/2015 18:31   Dg Chest Port 1 View  10/05/2015  CLINICAL DATA:  Pulmonary edema, history of coronary artery disease, CHF, CABG. Acute respiratory failure with hypoxia. EXAM: PORTABLE CHEST 1 VIEW COMPARISON:  Portable chest x-ray of Oct 04, 2015 FINDINGS: The lungs are well-expanded. The interstitial markings remain increased. The left hemidiaphragm is slightly better demonstrated today but increased retrocardiac density persists. The cardiac silhouette remains enlarged. The pulmonary vascularity is slightly less engorged. There are post CABG changes. IMPRESSION: CHF superimposed upon COPD. Pulmonary interstitial and mild alveolar edema, slightly improved. Left basilar atelectasis or pneumonia and small left pleural effusion. Electronically Signed   By: David  Martinique M.D.   On: 10/05/2015 07:25   Dg Chest Port 1 View  10/04/2015  CLINICAL DATA:  Hypoxia.  Dyspnea EXAM: PORTABLE CHEST 1 VIEW COMPARISON:  10/03/2015 FINDINGS: Airspace opacities in the central and basilar regions have worsened from the earlier study. Probable left pleural effusion. Moderate vascular and interstitial congestion. Moderate cardiomegaly. IMPRESSION: The findings likely represent congestive heart failure. Infectious infiltrates cannot be excluded. Electronically Signed   By: Andreas Newport M.D.   On: 10/04/2015 01:39   Dg Chest Port 1 View  10/03/2015  CLINICAL DATA:  Shortness of breath EXAM: PORTABLE CHEST 1 VIEW COMPARISON:  10/02/2015 FINDINGS: Cardiomegaly again noted. Status post CABG. Patchy airspace disease bilaterally especially lower lobes again noted. Probable small left pleural effusion with left basilar atelectasis or infiltrate. Central mild vascular congestion  without convincing pulmonary edema. IMPRESSION: Patchy airspace disease bilaterally especially lower lobes again noted. Probable small left pleural effusion and left basilar atelectasis or infiltrate. Central mild vascular congestion without convincing pulmonary edema. Electronically Signed   By: Lahoma Crocker M.D.   On: 10/03/2015 08:37   Dg Chest Port 1 View  10/02/2015  CLINICAL DATA:  Acute respiratory failure. EXAM: PORTABLE CHEST 1 VIEW COMPARISON:  10/01/2015 FINDINGS: Airspace opacities persist throughout the central and basilar regions of both lungs, without significant interval change. No pneumothorax. Unchanged cardiomegaly. IMPRESSION: No significant interval change in the bilateral airspace opacities. Electronically Signed   By: Andreas Newport M.D.   On: 10/02/2015 23:10   Dg Chest Port 1 View  10/01/2015  CLINICAL DATA:  Initial evaluation for acute chest pain. EXAM: PORTABLE CHEST 1 VIEW COMPARISON:  Prior radiograph from 09/30/2015. FINDINGS: Median sternotomy wires with underlying CABG markers again noted. Cardiomegaly stable. Atheromatous plaque within the aortic arch. Lungs mildly hypoinflated with elevation left hemidiaphragm. Interval worsening and patchy right perihilar and basilar opacity, worrisome for progressive pneumonia. Additional patchy opacity at the left lung base may reflect atelectasis or infiltrate. Mild vascular congestion without overt pulmonary edema. No definite pleural effusion, although the right costophrenic angle is incompletely visualized. No pneumothorax. No acute osseus abnormality. IMPRESSION: 1. Interval worsening in patchy right perihilar and basilar opacity, worrisome for progressive pneumonia. 2. Elevation of the left hemidiaphragm with additional patchy left basilar patchy opacity, which may reflect atelectasis or additional infiltrate.  3. Stable cardiomegaly with mild pulmonary vascular congestion without overt pulmonary edema. Electronically Signed   By:  Jeannine Boga M.D.   On: 10/01/2015 05:15   Dg Chest Port 1 View  09/30/2015  CLINICAL DATA:  Dyspnea, cough, fever. EXAM: PORTABLE CHEST 1 VIEW COMPARISON:  05/31/2015 FINDINGS: There is unchanged moderate cardiomegaly. There is patchy right base opacity which could represent pneumonia. No large effusion. IMPRESSION: Patchy right lung base opacity, potentially pneumonia although hemorrhage or asymmetric edema could also produce this appearance. Electronically Signed   By: Andreas Newport M.D.   On: 09/30/2015 22:33    Scheduled Meds: . sodium chloride   Intravenous Once  . amiodarone  200 mg Oral Daily  . amLODipine  10 mg Oral Daily  . aspirin EC  81 mg Oral Daily  . atorvastatin  80 mg Oral QPM  . calcium acetate  1,334 mg Oral TID WC  . insulin aspart  0-20 Units Subcutaneous TID WC  . insulin aspart  0-5 Units Subcutaneous QHS  . insulin aspart protamine- aspart  15 Units Subcutaneous BID WC  . isosorbide-hydrALAZINE  1 tablet Oral TID  . loratadine  10 mg Oral Daily  . metoprolol tartrate  25 mg Oral BID  . sertraline  100 mg Oral Daily  . sodium chloride flush  10-40 mL Intracatheter Q12H  . warfarin  7.5 mg Oral ONCE-1800  . Warfarin - Pharmacist Dosing Inpatient   Does not apply q1800   Continuous Infusions: . sodium chloride 10 mL/hr at 10/07/15 2214  . sodium chloride    . heparin 1,600 Units/hr (10/15/15 0553)   PRN Meds:.sodium chloride, sodium chloride, alteplase, heparin, heparin, HYDROcodone-acetaminophen, nitroGLYCERIN, sodium chloride flush, zolpidem  ASSESSMENT/PLAN:   Briana [name] is a 70 yo female with PMHx of CAD s/p CABG  In 2005, carotid artery stenosis, chronic diastolic CHF who presented to [facility] ED on 5/6 with complaint of 24 hours of chest pain and found to be in acute respiratory failure with hypoxia likely secondary to CAP. Cardiology was consulted for chest pain and new onset afib with RVR. Also had elevated troponin consistent with demand  ischemia/non-ST elevation myocardial infarction given elevation to 2.13 in the setting of underlying medical illness.  Non-ST elevation MI/CAD s/p CABG: Myoview on 5/17 was intermediate risk and showed an anterior defect with concern for per-infarct ischemia. Patient would benefit from a cardiac cath at some point; however, her worsening renal function makes timing difficult as we are hoping for improvement from a renal standpoint. Cath will likely be put off until kidney function recovers, I agree with Dr. Ellyn Hack that since she is chest pain-free currently and her troponin is trending down, I would continue with medical management for now and postpone potential cardiac catheterization until outpatient setting unless symptoms worsen. Troponin peak 2.13 on 10/01/15, currently trending downward 0.60 -ASA 81 mg daily -Atorvastatin 80 mg daily  -Metoprolol 25 mg BID -Bidil 20-37.5 1 tablet TID -Amlodipine 10 mg daily  Paroxysmal atrial fibrillation/Sinus Bradycardia: New onset afib s/p TEE with successful cardioversion on 5/15 into sinus bradycardia. HR 40-50s, decreased metoprolol to 25 mg BID. Hopefully with decrease in amiodarone, will be able to titrate BB back up. -Started coumadin on 10/14/15 as was suggested by Dr. Ellyn Hack on 10/13/15 -Amiodarone 200  daily now -Metoprolol 25 mg BID- consider increase as able  HTN: Hypertensive. -Decreased Metoprolol to 25 mg BID- consider increase as able -Bidil 20-37.5 1 tablet TID -Increased Amlodipine 10 mg daily  Chronic HFpEF:  Myoview showed EF of 45%. Previous echo 55-60% with grade 3 diastolic dysfunction.  -Metoprolol 25 mg BID -Bidil 20-37.5 1 tablet TID -Amlodipine 10 mg daily -Holding home lasix due to worsening renal function -Monitor fluid status and renal function  Acute renal failure on Chronic CKD III: Likely 2/2 ATN. Creatinine baseline 1.6-2.3, worsening this admission. Patient was started on iHD given her creatinine >7. Has not  required any more sessions. Plans are to monitor for renal recovery. UOP 1400 yesterday. No HD today per Nephrology.  -Nephrology following, likely ATN, muddy casts -Holding Lasix  -May have HD cath removed tomorrow   Normocytic Anemia: Hgb 8.1 stable from 7.5 after one unit PRBC on 10/13/15. It appears there was an initially drop at admission from 10 to 8. Unclear cause of drop, concern for bleeding.  - FOBT negative  Hep B Surface Ab Negative: Not immune, could consider revaccination at some point.  DVT/PE ppx: Heparin gtt (transiently held because of drop in hemoglobin, now restarted) Please disregard accidental discontinuation of heparin IV drip, we are continuing with Coumadin.  Transfer to floor. Orders written.  Candee Furbish, MD   10/15/2015 9:55 AM

## 2015-10-15 NOTE — Progress Notes (Signed)
Admit: 09/30/2015 LOS: 52  30F with AoCKD3 (BL 1.6-2.3) in setiting of CAP, A/C dCHF exacerbation, AFib with RVR eventually requiring DCCV  Subjective:  In good spirits SCr improved in past 24h Increase UOP   05/20 0701 - 05/21 0700 In: 1104.9 [P.O.:1020; I.V.:84.9] Out: 1400 [Urine:1400]  Filed Weights   10/13/15 0358 10/14/15 0300 10/15/15 0441  Weight: 109.9 kg (242 lb 4.6 oz) 111.3 kg (245 lb 6 oz) 110.8 kg (244 lb 4.3 oz)    Scheduled Meds: . sodium chloride   Intravenous Once  . amiodarone  200 mg Oral Daily  . amLODipine  10 mg Oral Daily  . aspirin EC  81 mg Oral Daily  . atorvastatin  80 mg Oral QPM  . calcium acetate  1,334 mg Oral TID WC  . insulin aspart  0-20 Units Subcutaneous TID WC  . insulin aspart  0-5 Units Subcutaneous QHS  . insulin aspart protamine- aspart  15 Units Subcutaneous BID WC  . isosorbide-hydrALAZINE  1 tablet Oral TID  . loratadine  10 mg Oral Daily  . metoprolol tartrate  25 mg Oral BID  . sertraline  100 mg Oral Daily  . sodium chloride flush  10-40 mL Intracatheter Q12H  . warfarin  7.5 mg Oral ONCE-1800  . Warfarin - Pharmacist Dosing Inpatient   Does not apply q1800   Continuous Infusions: . sodium chloride 10 mL/hr at 10/07/15 2214  . sodium chloride    . heparin 1,600 Units/hr (10/15/15 0553)   PRN Meds:.sodium chloride, sodium chloride, alteplase, heparin, heparin, HYDROcodone-acetaminophen, nitroGLYCERIN, sodium chloride flush, zolpidem  Current Labs: reviewed    Physical Exam:  Blood pressure 168/51, pulse 50, temperature 98.4 F (36.9 C), temperature source Oral, resp. rate 14, height 5\' 5"  (1.651 m), weight 110.8 kg (244 lb 4.3 oz), SpO2 94 %. GEN: NAD ENT: NCAT EYES: EOMI CV: RRR PULM: Diminished in bases ABD: s/nt/nd SKIN: no rashes/lesions EXT:3+ LEE R IJ Temp Cath  RENAL STUDIES 1. Renal US 5/11: negative for obstruction 2. Urine Sediment: numerous bacteria. Scattered brown pigmented granular casts. Occ  monomorphic RBC no dysmorphic features. No cellular casts 3. Renal Doppler 5/17: no obvious arterial obstruction, inc RIs 4. 5/17 CK neg 5. R IJ Temp Cath placed by PCCM 6. HD #1 5/18  Assessment  1. AoCKD3 2/2 ATN; recovering 1. Baseline 1.6-2.3 2. No obstruction or obvious nephrotoxins; duplex neg 3. Based on urine sediment with muddy brown casts, looks like has had ATN 4. iHD started 5/18, only 1 Tx thus far 5. Now in beginning of recovery 2. dCHF Exacerbation 3. CAP s/p Azithro and Ceftriaxone 4. AFib s/p DCCV 5/15, successful 5. HTN 6. Obesity 7. DM2 8. CAD s/p CABG, r/o ACS with Nuc ST: cardiology following  Plan 1. No HD today 2. Remove foley 3. If more improvement tomorrow remove HD cath 4. Monitor UOP, labs in AM 5. Daily weights, Daily Renal Panel, Strict I/Os, Avoid nephrotoxins (NSAIDs, judicious IV Contrast)   Pearson Grippe MD 10/15/2015, 8:44 AM   Recent Labs Lab 10/13/15 0434 10/14/15 0315 10/15/15 0415  NA 135 137 137  K 3.8 3.8 4.0  CL 99* 99* 102  CO2 25 25 24   GLUCOSE 144* 96 129*  BUN 62* 68* 68*  CREATININE 4.54* 4.73* 4.31*  CALCIUM 8.1* 8.0* 8.3*  PHOS 5.3* 5.5* 4.3    Recent Labs Lab 10/13/15 0436 10/14/15 0315 10/15/15 0415  WBC 9.0 8.9 9.3  HGB 7.5* 8.1* 8.1*  HCT 23.8* 25.9* 26.1*  MCV 83.2 83.8 83.9  PLT 240 217 213

## 2015-10-15 NOTE — Progress Notes (Signed)
[facility] for Heparin/Coumadin Indication: afib  Allergies  Allergen Reactions  . Carbamazepine Other (See Comments)    Reaction to tegretol - loopy  . Clonazepam Other (See Comments)    Unknown reaction  . Gemfibrozil Other (See Comments)    Patient is not aware of this allergy but states that she has side effects to a cholesterol medication in the past  . Macrobid [Nitrofurantoin] Nausea And Vomiting  . Oxycodone Other (See Comments)    hallucinations    Patient Measurements: Height: 5\' 5"  (165.1 cm) Weight: 244 lb 4.3 oz (110.8 kg) IBW/kg (Calculated) : 57  Vital Signs: Temp: 98.7 F (37.1 C) (05/21 0441) Temp Source: Oral (05/21 0441) BP: 154/46 mmHg (05/21 0441) Pulse Rate: 55 (05/20 2351)  Labs:  Recent Labs  10/13/15 0400 10/13/15 0434  10/13/15 0436 10/14/15 0315 10/15/15 0415  HGB  --   --   < > 7.5* 8.1* 8.1*  HCT  --   --   --  23.8* 25.9* 26.1*  PLT  --   --   --  240 217 213  LABPROT  --   --   --   --   --  15.6*  INR  --   --   --   --   --  1.23  HEPARINUNFRC 0.57  --   --   --  0.38 <0.10*  CREATININE  --  4.54*  --   --  4.73*  --   < > = values in this interval not displayed.  Estimated Creatinine Clearance: 13.7 mL/min (by C-G formula based on Cr of 4.73).   Assessment: Heparin bridge to coumadin for new onset AFib. Heparin gtt inadvertently discontinued 5/20 ~1016. Dr. Marlou Porch reordered heparin protocol ~1020 for pharmacy to restart the heparin he accidentally discontinued. This new protocol appears to be have been missed by dayshift pharmacist so heparin has been off since ~1020 5/20. Will restart heparin bridge this a.m. Hgb remains low but stable. INR 1.23 - subtherapeutic.  Goal of Therapy:  Heparin level 0.3-0.7 units/ml; INR 2-3 Monitor platelets by anticoagulation protocol: Yes   Plan:  - Restart heparin at 1600 units/hr - F/u 8 hr heparin level - Coumadin 7.5mg  po again tonight - Daily INR,  CBC, heparin level  Sherlon Handing, PharmD, BCPS Clinical pharmacist, pager [phone] 10/15/2015 5:13 AM

## 2015-10-16 LAB — CBC
HCT: 27 % — ABNORMAL LOW (ref 36.0–46.0)
Hemoglobin: 8.2 g/dL — ABNORMAL LOW (ref 12.0–15.0)
MCH: 26.7 pg (ref 26.0–34.0)
MCHC: 30.4 g/dL (ref 30.0–36.0)
MCV: 87.9 fL (ref 78.0–100.0)
PLATELETS: 211 10*3/uL (ref 150–400)
RBC: 3.07 MIL/uL — ABNORMAL LOW (ref 3.87–5.11)
RDW: 15.4 % (ref 11.5–15.5)
WBC: 10.3 10*3/uL (ref 4.0–10.5)

## 2015-10-16 LAB — HEPARIN LEVEL (UNFRACTIONATED)
HEPARIN UNFRACTIONATED: 0.47 [IU]/mL (ref 0.30–0.70)
Heparin Unfractionated: 0.59 IU/mL (ref 0.30–0.70)

## 2015-10-16 LAB — GLUCOSE, CAPILLARY
GLUCOSE-CAPILLARY: 114 mg/dL — AB (ref 65–99)
GLUCOSE-CAPILLARY: 156 mg/dL — AB (ref 65–99)
Glucose-Capillary: 160 mg/dL — ABNORMAL HIGH (ref 65–99)
Glucose-Capillary: 193 mg/dL — ABNORMAL HIGH (ref 65–99)

## 2015-10-16 LAB — RENAL FUNCTION PANEL
ALBUMIN: 2.6 g/dL — AB (ref 3.5–5.0)
ANION GAP: 7 (ref 5–15)
BUN: 59 mg/dL — AB (ref 6–20)
CALCIUM: 8.6 mg/dL — AB (ref 8.9–10.3)
CO2: 26 mmol/L (ref 22–32)
CREATININE: 3.58 mg/dL — AB (ref 0.44–1.00)
Chloride: 107 mmol/L (ref 101–111)
GFR calc Af Amer: 14 mL/min — ABNORMAL LOW (ref >60)
GFR calc non Af Amer: 12 mL/min — ABNORMAL LOW (ref >60)
GLUCOSE: 113 mg/dL — AB (ref 65–99)
PHOSPHORUS: 3.9 mg/dL (ref 2.5–4.6)
Potassium: 4.3 mmol/L (ref 3.5–5.1)
SODIUM: 140 mmol/L (ref 135–145)

## 2015-10-16 MED ORDER — FUROSEMIDE 40 MG PO TABS
40.0000 mg | ORAL_TABLET | Freq: Every day | ORAL | Status: DC
  Administered 2015-10-16 – 2015-10-17 (×2): 40 mg via ORAL
  Filled 2015-10-16 (×2): qty 1

## 2015-10-16 MED ORDER — WARFARIN SODIUM 5 MG PO TABS
5.0000 mg | ORAL_TABLET | Freq: Once | ORAL | Status: AC
  Administered 2015-10-16: 5 mg via ORAL
  Filled 2015-10-16: qty 1

## 2015-10-16 NOTE — Progress Notes (Signed)
Patient ID: Briana [name], female   DOB: 02-Apr-1946, 70 y.o.   MRN: [id number]  [facility] Progress Note   Assessment/ Plan:   1. AKI on Chronic kidney disease stage III (baseline creatinine 1.6-2.3) likely hemodynamically mediated with acute diastolic heart failure as well as A. fib with RVR requiring DCCV. She is nonoliguric and net -600 mL overnight. Creatinine trending down slowly without any acute electrolyte abnormalities to prompt intervention with hemodialysis. She does not have any uremic signs or symptoms. With her current volume excess-will restart furosemide. 2. Acute diastolic heart failure exacerbation: Appears to be exacerbated primarily by atrial fibrillation with RVR-currently appears improved (further helped by improving renal function/urine output). 3. Community acquired pneumonia: Status post completion of therapy with azithromycin and ceftriaxone-afebrile. 4. Atrial fibrillation status post DC CV: Currently appears to be in sinus rhythm, on amiodarone/warfarin 5. Anemia: Check iron studies and replace this prior to initiating ESA.  Subjective:   Reports to be feeling better and complains of pedal edema    Objective:   BP 154/41 mmHg  Pulse 54  Temp(Src) 98.6 F (37 C) (Oral)  Resp 18  Ht 5\' 5"  (1.651 m)  Wt 109.861 kg (242 lb 3.2 oz)  BMI 40.30 kg/m2  SpO2 94%  Intake/Output Summary (Last 24 hours) at 10/16/15 1350 Last data filed at 10/16/15 [id number]  Gross per 24 hour  Intake 1060.48 ml  Output   1525 ml  Net -464.52 ml   Weight change: -0.939 kg (-2 lb 1.1 oz)  Physical Exam: [id number] comfortably up in her recliner, talking to her friend sitting across her CVS: Pulse regular rhythm, S1 and S2 normal Resp: Decreased breath sounds at bases otherwise clear Abd: Soft, obese, nontender Ext: 3+ to 4+ tense pitting edema  Imaging: No results found.  Labs: BMET  Recent Labs Lab 10/10/15 0212 10/11/15 0322 10/12/15 0455  10/13/15 0434 10/14/15 0315 10/15/15 0415 10/16/15 0420  NA 135 135 133* 135 137 137 140  K 4.7 4.6 4.6 3.8 3.8 4.0 4.3  CL 102 101 99* 99* 99* 102 107  CO2 18* 17* 17* 25 25 24 26   GLUCOSE 163* 110* 97 144* 96 129* 113*  BUN 101* 111* 118* 62* 68* 68* 59*  CREATININE 5.32* 6.47* 7.31* 4.54* 4.73* 4.31* 3.58*  CALCIUM 8.3* 8.6* 8.5* 8.1* 8.0* 8.3* 8.6*  PHOS 8.4* 8.5* 9.0* 5.3* 5.5* 4.3 3.9   CBC  Recent Labs Lab 10/13/15 0436 10/14/15 0315 10/15/15 0415 10/16/15 0420  WBC 9.0 8.9 9.3 10.3  HGB 7.5* 8.1* 8.1* 8.2*  HCT 23.8* 25.9* 26.1* 27.0*  MCV 83.2 83.8 83.9 87.9  PLT 240 217 213 211    Medications:    . sodium chloride   Intravenous Once  . amiodarone  200 mg Oral Daily  . amLODipine  10 mg Oral Daily  . aspirin EC  81 mg Oral Daily  . atorvastatin  80 mg Oral QPM  . calcium acetate  1,334 mg Oral TID WC  . insulin aspart  0-20 Units Subcutaneous TID WC  . insulin aspart  0-5 Units Subcutaneous QHS  . insulin aspart protamine- aspart  15 Units Subcutaneous BID WC  . isosorbide-hydrALAZINE  1 tablet Oral TID  . loratadine  10 mg Oral Daily  . metoprolol tartrate  25 mg Oral BID  . sertraline  100 mg Oral Daily  . sodium chloride flush  10-40 mL Intracatheter Q12H  . warfarin  5 mg Oral ONCE-1800  . Warfarin -  Pharmacist Dosing Inpatient   Does not apply q1800   Elmarie Shiley, MD 10/16/2015, 1:50 PM

## 2015-10-16 NOTE — Progress Notes (Signed)
HISTORY: Ms. Deus is a 70 yo female with PMHx of CAD s/p CABG in 2005, carotid artery stenosis, chronic diastolic CHF, OSA, HTN, HLD, and CKD who presented to [facility] ED on 5/6 with complaint of 24 hours of chest pain. Patient was found to be in acute respiratory failure with hypoxia likely secondary to CAP. Cardiology was consulted for chest pain on 5/7. EKG showed lateral ST depression with a mild troponin elevation at 0.12 (peak 2.13). Patient developed atrial fibrillation with RVR on 5/9 and is s/p successful cardioversion to sinus bradycardia on 5/15.  Subjective: No complaints, no chest pain though some mild DOE  Objective: Vital signs in last 24 hours: Temp:  [98.1 F (36.7 C)-98.6 F (37 C)] 98.6 F (37 C) (05/22 0739) Pulse Rate:  [50-56] 51 (05/22 0739) Resp:  [10-20] 18 (05/22 0739) BP: (145-171)/(38-55) 145/38 mmHg (05/22 0739) SpO2:  [88 %-96 %] 94 % (05/22 0739) Weight:  [242 lb 3.2 oz (109.861 kg)] 242 lb 3.2 oz (109.861 kg) (05/22 0541) Weight change: -2 lb 1.1 oz (-0.939 kg) Last BM Date: 10/14/15 Intake/Output from previous day: -594 05/21 0701 - 05/22 0700 In: 1380.5 [P.O.:960; I.V.:420.5] Out: 1975 [Urine:1975] Intake/Output this shift:    PE: General:Pleasant affect, NAD Skin:Warm and dry, brisk capillary refill HEENT:normocephalic, sclera clear, mucus membranes moist Heart:S1S2 RRR without murmur, gallup, rub or click Lungs:clear without rales, rhonchi, or wheezes [id number], non tender, + BS, do not palpate liver spleen or masses Ext:2+ lower ext edema,  2+ radial pulses Neuro:alert and oriented X 3, MAE, follows commands, + facial symmetry Tele:  SR with brief run PACs.   Lab Results:  Recent Labs  10/15/15 0415 10/16/15 0420  WBC 9.3 10.3  HGB 8.1* 8.2*  HCT 26.1* 27.0*  PLT 213 211   BMET  Recent Labs  10/15/15 0415 10/16/15 0420  NA 137 140  K 4.0 4.3  CL 102 107  CO2 24 26  GLUCOSE 129* 113*  BUN 68* 59*    CREATININE 4.31* 3.58*  CALCIUM 8.3* 8.6*   No results for input(s): TROPONINI in the last 72 hours.  Invalid input(s): CK, MB  Lab Results  Component Value Date   CHOL  04/30/2009    142        ATP III CLASSIFICATION:  <200     mg/dL   Desirable  200-239  mg/dL   Borderline High  >=240    mg/dL   High          HDL 41 04/30/2009   LDLCALC  04/30/2009    71        Total Cholesterol/HDL:CHD Risk Coronary Heart Disease Risk Table                     Men   Women  1/2 Average Risk   3.4   3.3  Average Risk       5.0   4.4  2 X Average Risk   9.6   7.1  3 X Average Risk  23.4   11.0        Use the calculated Patient Ratio above and the CHD Risk Table to determine the patient's CHD Risk.        ATP III CLASSIFICATION (LDL):  <100     mg/dL   Optimal  100-129  mg/dL   Near or Above  Optimal  130-159  mg/dL   Borderline  160-189  mg/dL   High  >190     mg/dL   Very High   TRIG 151* 04/30/2009   CHOLHDL 3.5 04/30/2009   Lab Results  Component Value Date   HGBA1C 6.9* 09/15/2015     Lab Results  Component Value Date   TSH 1.354 10/04/2015    Hepatic Function Panel  Recent Labs  10/16/15 0420  ALBUMIN 2.6*   No results for input(s): CHOL in the last 72 hours. No results for input(s): PROTIME in the last 72 hours.     Studies/Results: TEE 10/09/15: - Left ventricle: Systolic function was normal. The estimated  ejection fraction was in the range of 55% to 60%. No evidence of  thrombus. - Aortic valve: No evidence of vegetation. There was mild  regurgitation. - Mitral valve: There was mild regurgitation. - Left atrium: The atrium was moderately to severely dilated. No  evidence of thrombus in the atrial cavity or appendage. No  evidence of thrombus in the atrial cavity or appendage. No  evidence of thrombus in the atrial cavity or appendage. - Atrial septum: No defect or patent foramen ovale was identified  by color flow  Doppler. - Tricuspid valve: There was moderate regurgitation.  Medications: I have reviewed the patient's current medications. Scheduled Meds: . sodium chloride   Intravenous Once  . amiodarone  200 mg Oral Daily  . amLODipine  10 mg Oral Daily  . aspirin EC  81 mg Oral Daily  . atorvastatin  80 mg Oral QPM  . calcium acetate  1,334 mg Oral TID WC  . insulin aspart  0-20 Units Subcutaneous TID WC  . insulin aspart  0-5 Units Subcutaneous QHS  . insulin aspart protamine- aspart  15 Units Subcutaneous BID WC  . isosorbide-hydrALAZINE  1 tablet Oral TID  . loratadine  10 mg Oral Daily  . metoprolol tartrate  25 mg Oral BID  . sertraline  100 mg Oral Daily  . sodium chloride flush  10-40 mL Intracatheter Q12H  . Warfarin - Pharmacist Dosing Inpatient   Does not apply q1800   Continuous Infusions: . sodium chloride 10 mL/hr at 10/07/15 2214  . heparin 1,750 Units/hr (10/15/15 1524)   PRN Meds:.sodium chloride, sodium chloride, alteplase, heparin, heparin, HYDROcodone-acetaminophen, nitroGLYCERIN, sodium chloride flush, zolpidem  Assessment/Plan: Active Problems:   Acute on chronic diastolic CHF (congestive heart failure) (HCC)   Acute respiratory failure (HCC)   Hypoxia   Elevated troponin   Chest pain   DVT (deep venous thrombosis) (HCC)   Hypoxemia   Acute respiratory failure with hypoxia (HCC)   Acute pulmonary edema (HCC)   Diastolic CHF, acute on chronic (HCC)   Acute blood loss anemia   Controlled diabetes mellitus type 2 with complications (HCC)   Atrial fibrillation with rapid ventricular response ([place])   CAP (community acquired pneumonia)   CAD in native artery   Pulmonary hypertension ([place])   Atrial fibrillation with RVR ([place])   Acute on chronic renal failure ([place])   Uncontrolled type 2 diabetes mellitus with complication (HCC)   Physical deconditioning   Pulmonary edema   Abnormal nuclear stress test   Acute renal failure with tubular necrosis (HCC)    Hypertensive emergency   Non-ST elevation myocardial infarction (NSTEMI) ([place])   Non-ST elevation MI/CAD s/p CABG: Myoview on 5/17 was intermediate risk and showed an anterior defect with concern for per-infarct ischemia. Patient would benefit from a cardiac cath at  some point; however, her worsening renal function makes timing difficult as we are hoping for improvement from a renal standpoint. Cath will likely be put off until kidney function recovers, per Dr. Marlou Porch and Dr. Ellyn Hack "that since she is chest pain-free currently and her troponin is trending down, they would continue with medical management for now and postpone potential cardiac catheterization until outpatient setting unless symptoms worsen."  Troponin peak 2.13 on 10/01/15, currently trending downward 0.60 -ASA 81 mg daily -Atorvastatin 80 mg daily  -Metoprolol 25 mg BID -Bidil 20-37.5 1 tablet TID -Amlodipine 10 mg daily  Paroxysmal atrial fibrillation/Sinus Bradycardia: New onset afib s/p TEE with successful cardioversion on 5/15 into sinus bradycardia. HR 40-50s, decreased metoprolol to 25 mg BID. Hopefully with decrease in amiodarone, will be able to titrate BB back up. -Started coumadin on 10/14/15 as was suggested by Dr. Ellyn Hack on 10/13/15 INR 1.23 yesterday -Amiodarone 200 daily now -Metoprolol 25 mg BID- consider increase as able  HTN: Hypertensive. BP today 145/38 -Decreased Metoprolol to 25 mg BID- consider increase as able -Bidil 20-37.5 1 tablet TID -Increased Amlodipine 10 mg daily   Chronic HFpEF: Myoview showed EF of 45%. Previous echo 55-60% with grade 3 diastolic dysfunction.  -Metoprolol 25 mg BID -Bidil 20-37.5 1 tablet TID -Amlodipine 10 mg daily -Holding home lasix due to renal function + lower ext edema, per renal team. -Monitor fluid status and renal function  Acute renal failure on Chronic CKD III: Likely 2/2 ATN. Creatinine baseline 1.6-2.3, worsening this admission. Patient was started on iHD  given her creatinine >7. Has not required any more sessions. Plans are to monitor for renal recovery. UOP 1400 yesterday. No HD today per Nephrology.  -Nephrology following, likely ATN, muddy casts -Holding Lasix - per renal -May have HD cath removed tomorrow   Normocytic Anemia: Hgb 8.2 stable from 7.5 after one unit PRBC on 10/13/15. It appears there was an initially drop at admission from 10 to 8. Unclear cause of drop, concern for bleeding.  - FOBT negative  Hep B Surface Ab Negative: Not immune, could consider revaccination at some point.  DVT/PE ppx: Heparin gtt (transiently held because of drop in hemoglobin, now restarted) Please disregard accidental discontinuation of heparin IV drip, we are continuing with Coumadin.  Acute Renal failure, now improving -did undergo dialysis, plan to remove HD cath today. Followed by nephrology.  Today 3.58  pk of 7.31     LOS: 15 days   Time spent with pt. :15 minutes. Cecilie Kicks  Nurse Practitioner Certified Pager [phone] or after 5pm and on weekends call [phone] 10/16/2015, 8:49 AM   The patient has been seen in conjunction with Cecilie Kicks, NP. All aspects of care have been considered and discussed. The patient has been personally interviewed, examined, and all clinical data has been reviewed.   Gradually improving. Breathing difficulty and chest pain has resolved.  Future ischemic w/u possible when kidney function stabilizes.  Sinus brady and tolerating combo of amio and BB therapy.

## 2015-10-16 NOTE — Progress Notes (Signed)
Physical Therapy Treatment Patient Details Name: Briana [name] MRN: [id number] DOB: 1945-09-09 Today's Date: 10/16/2015    History of Present Illness Pt adm with acute hypoxic Respiratory Failure in setting of bilateral pulmonary infiltrates. Favor acute Pulmonary Edema over PNA. Pt placed on bipap. PMH - DM, CAD, diastolic HF,     PT Comments    Lissa is making good progress but continues to fatigue w/ moderate distance ambulation.  She requires RW for balance and energy conservation.  Pt encouraged to ambulate w/ the nursing staff at least 3x/day.  She remains eager to get stronger.   Follow Up Recommendations  CIR     Equipment Recommendations  Rolling walker with 5" wheels    Recommendations for Other Services       Precautions / Restrictions Precautions Precautions: Fall Precaution Comments: monitor HR an O2 sats Restrictions Weight Bearing Restrictions: No    Mobility  Bed Mobility               General bed mobility comments: Pt sitting in recliner chair upon PT arrival  Transfers Overall transfer level: Needs assistance Equipment used: Rolling walker (2 wheeled) Transfers: Sit to/from Stand Sit to Stand: Supervision         General transfer comment: Supervision for safety  Ambulation/Gait Ambulation/Gait assistance: Min guard Ambulation Distance (Feet): 150 Feet Assistive device: Rolling walker (2 wheeled) Gait Pattern/deviations: Step-through pattern;Decreased stride length   Gait velocity interpretation: Below normal speed for age/gender General Gait Details: Pt did not require rest break today but demonstrated decreased gait speed.  SpO2 drops briefly to 86% but otherwise 88-92% on 1L O2.  Cues for pursed lip breathing.     Stairs            Wheelchair Mobility    Modified Rankin (Stroke Patients Only)       Balance Overall balance assessment: Needs assistance Sitting-balance support: No upper extremity supported;Feet  supported Sitting balance-Leahy Scale: Good     Standing balance support: No upper extremity supported;During functional activity Standing balance-Leahy Scale: Fair Standing balance comment: RW for dynamic activity                    Cognition Arousal/Alertness: Awake/alert Behavior During Therapy: WFL for tasks assessed/performed Overall Cognitive Status: Within Functional Limits for tasks assessed                      Exercises General Exercises - Lower Extremity Ankle Circles/Pumps: AROM;Both;10 reps;Seated Long Arc Quad: Strengthening;Both;5 reps;Seated;Other (comment) (w/ manual resistance) Other Exercises Other Exercises: Encourged pt to ambulate w/ nursing staff 3/day    General Comments        Pertinent Vitals/Pain Pain Assessment: 0-10 Pain Score: 7  Pain Location: chronic back pain Pain Descriptors / Indicators: Aching Pain Intervention(s): Limited activity within patient's tolerance;Monitored during session;Repositioned    Home Living                      Prior Function            PT Goals (current goals can now be found in the care plan section) Acute Rehab PT Goals Patient Stated Goal: to get her strength back PT Goal Formulation: With patient Time For Goal Achievement: 10/20/15 Potential to Achieve Goals: Good Progress towards PT goals: Progressing toward goals    Frequency  Min 3X/week    PT Plan Current plan remains appropriate    Co-evaluation  End of Session Equipment Utilized During Treatment: Gait belt;Oxygen Activity Tolerance: Patient tolerated treatment well;Patient limited by fatigue Patient left: in chair;with call bell/phone within reach;with chair alarm set     Time: 1429-1450 PT Time Calculation (min) (ACUTE ONLY): 21 min  Charges:  $Gait Training: 8-22 mins                    G Codes:      Collie Siad PT, DPT  Pager: [phone] Phone: [phone] 10/16/2015, 3:06 PM

## 2015-10-16 NOTE — Progress Notes (Signed)
ANTICOAGULATION CONSULT NOTE - Follow Up Consult  Pharmacy Consult for heparin Indication: atrial fibrillation  Allergies  Allergen Reactions  . Carbamazepine Other (See Comments)    Reaction to tegretol - loopy  . Clonazepam Other (See Comments)    Unknown reaction  . Gemfibrozil Other (See Comments)    Patient is not aware of this allergy but states that she has side effects to a cholesterol medication in the past  . Macrobid [Nitrofurantoin] Nausea And Vomiting  . Oxycodone Other (See Comments)    hallucinations    Patient Measurements: Height: 5\' 5"  (165.1 cm) Weight: 242 lb 3.2 oz (109.861 kg) IBW/kg (Calculated) : 57 Heparin Dosing Weight: 80 kg  Vital Signs: Temp: 98.5 F (36.9 C) (05/21 1941) Temp Source: Oral (05/21 1941) BP: 145/55 mmHg (05/21 1941) Pulse Rate: 50 (05/21 1941)  Labs:  Recent Labs  10/13/15 0434  10/13/15 0436 10/14/15 0315 10/15/15 0415 10/15/15 1441 10/15/15 2350  HGB  --   < > 7.5* 8.1* 8.1*  --   --   HCT  --   --  23.8* 25.9* 26.1*  --   --   PLT  --   --  240 217 213  --   --   LABPROT  --   --   --   --  15.6*  --   --   INR  --   --   --   --  1.23  --   --   HEPARINUNFRC  --   --   --  0.38 <0.10* 0.20* 0.47  CREATININE 4.54*  --   --  4.73* 4.31*  --   --   < > = values in this interval not displayed.  Estimated Creatinine Clearance: 15 mL/min (by C-G formula based on Cr of 4.31).  Assessment:  70 y/o F for heparin bridge to coumadin for new onset AFib. Heparin gtt therapeutic on 1750 units/hr. No bleeding noted.   Goal of Therapy:  Heparin level 0.3-0.7 units/ml Monitor platelets by anticoagulation protocol: Yes   Plan:  Continue heparin gtt at 1750 units/hr Daily heparin level and CBC  Sherlon Handing, PharmD, BCPS Clinical pharmacist, pager [phone]  10/16/2015,12:29 AM

## 2015-10-16 NOTE — Progress Notes (Signed)
PROGRESS NOTE  DANYETTA RIZZUTI [id number] DOB: Sep 11, 1945 DOA: 09/30/2015 PCP: Antionette Fairy, PA-C   LOS: 15 days   Brief Narrative: Ms. Ptacek is an 70 y.o. WF PMHx Depression with AnxietyDM Type 2 with complication (neuropathy), CAD native artery, PVD, Chronic Diastolic CHF, Heart murmur, OSA, HTN, HLD,Adenomatous colon polyp, CKD, who presented to the ED at [facility] on the evening of 5/6 with 24 hours of chest pain. In ED found to have: hypoxia, a fever to 101.6, WBC of 15.5, EKG changes (lateral ST depression), mild troponin elevation (0.12). BNP was elevated to 894.0, previously was 391. She had a CXR which showed alveolar opacities, most dense in the Rt base. Cultures were obtained, she was started on CAP coverage, and due to the hypoxia was transferred to [facility] for tx of pneumonia.   Assessment & Plan: Active Problems:   Acute on chronic diastolic CHF (congestive heart failure) (HCC)   Acute respiratory failure (HCC)   Hypoxia   Elevated troponin   Chest pain   DVT (deep venous thrombosis) (HCC)   Hypoxemia   Acute respiratory failure with hypoxia (HCC)   Acute pulmonary edema (HCC)   Diastolic CHF, acute on chronic (HCC)   Acute blood loss anemia   Controlled diabetes mellitus type 2 with complications (HCC)   Atrial fibrillation with rapid ventricular response ([place])   CAP (community acquired pneumonia)   CAD in native artery   Pulmonary hypertension ([place])   Atrial fibrillation with RVR ([place])   Acute on chronic renal failure ([place])   Uncontrolled type 2 diabetes mellitus with complication (HCC)   Physical deconditioning   Pulmonary edema   Abnormal nuclear stress test   Acute renal failure with tubular necrosis (HCC)   Hypertensive emergency   Non-ST elevation myocardial infarction (NSTEMI) ([place])   Acute hypoxic Respiratory Failure in setting of bilateral pulmonary infiltrates. PNA vs Pulmonary Edema  - completed course of antibiotics 5 days - currently on  [place], wean oxygen as tolerated   CAP - Completed 5 day course antibiotics  CAD native artery / NSTEMI - patient with chest pain, positive troponin - cardiology following and for now recommending medical management given CKD to avoid cath which can be done as outpatient.   Acute on chronic Diastolic CHF/Pulmonary Hypertension - Daily weights, fluid management per renal   A-Fib RVR/RBBB(CHA2DS2 VASc score is 6+) - EKG shows A. fib RVR with RBBB - s/p DCCV 5/15  - heparin / Coumadin   HTN emergency w/ associated pulmonary edema  - BP 154/41, controlled, continue current management with Amlodipine, Bidil, Metoprolol,   Acute on CKD (baseline Cr~1.6 -2) - Patient with progressive renal failure requiring hemodialysis 1 - Following HD her kidney function has recovered on its own, creatinine continues to trend down 3.58 today from 4.7 yesterday - Nephrology following  Acute blood loss anemia vs anemia of critical illness - Hemoglobin stable  - hemoccult negative   Leukocytosis - Resolved   DM Type 2 controlled with complication - 123456 hemoglobin A1c= 6.9  - NovoLog 70/30 15 units BID - Resistant SSI  Depression - Zoloft 100 mg daily   Deconditioning - Pending CIR admission, patient not stable today due to significant fluid overload  DVT prophylaxis: heparin gtt / Coumadin Code Status: Full Family Communication: no family bedside Disposition Plan: CIR when ready   Consultants:   Cardiology   Nephrology   Pulmonary  Procedures:  CXR -- alveolar filling pattern, most dense in RLL. EKG -  lateral ST depression, TWI throughout 5/7 Bilat LE Doppler; Negative DVT/SVT 5/8 echocardiogram;Left ventricle: mild focal basal hypertrophy of the septum. 55% to 60%. -(grade 3 diastolic dysfunction).-Left atrium: severely dilated. - Tricuspid valve: moderate regurgitation. - Pulmonary arteries: PA peak pressure: 69 mm Hg (S). 5/9 Transfuse 1 unit PRBC 5/11 renal  ultrasound;-Low normal renal cortical thickness, a finding that may be seen with medical renal disease. . 5/12 RVR persists  5/15 TEE w/ DCCV > sinus bradycardia  -5/19 transfuse 1 unit PRBC  Antimicrobials: Azithromycin 5/6 >>5/10  Ceftriaxone 5/6 >> 5/11   Subjective: - complains of generalized swelling. No chest pain, breathing has improved  Objective: Filed Vitals:   10/16/15 0541 10/16/15 0739 10/16/15 0942 10/16/15 0943  BP: 150/49 145/38 154/41   Pulse: 53 51  54  Temp: 98.4 F (36.9 C) 98.6 F (37 C)    TempSrc: Oral Oral    Resp: 17 18    Height:      Weight: 109.861 kg (242 lb 3.2 oz)     SpO2: 92% 94%      Intake/Output Summary (Last 24 hours) at 10/16/15 1048 Last data filed at 10/16/15 0650  Gross per 24 hour  Intake 1076.48 ml  Output   1525 ml  Net -448.52 ml   Filed Weights   10/15/15 0441 10/15/15 1247 10/16/15 0541  Weight: 110.8 kg (244 lb 4.3 oz) 109.861 kg (242 lb 3.2 oz) 109.861 kg (242 lb 3.2 oz)    Examination: Constitutional: NAD Filed Vitals:   10/16/15 0541 10/16/15 0739 10/16/15 0942 10/16/15 0943  BP: 150/49 145/38 154/41   Pulse: 53 51  54  Temp: 98.4 F (36.9 C) 98.6 F (37 C)    TempSrc: Oral Oral    Resp: 17 18    Height:      Weight: 109.861 kg (242 lb 3.2 oz)     SpO2: 92% 94%     ENMT: Mucous membranes are moist.  Neck: normal, supple, no masses, no thyromegaly Respiratory: clear to auscultation bilaterally, no wheezing, no crackles. Normal respiratory effort. No accessory muscle use.  Cardiovascular: Regular rate and rhythm, 3/6 SEM. 2+ pitting edema. 2+ pedal pulses. No carotid bruits.  Abdomen: no tenderness. Bowel sounds positive.  Neurologic: non focal   Psychiatric: Normal judgment and insight. Alert and oriented x 3. Normal mood.    Data Reviewed: I have personally reviewed following labs and imaging studies  CBC:  Recent Labs Lab 10/12/15 0455 10/13/15 0436 10/14/15 0315 10/15/15 0415 10/16/15 0420    WBC 11.6* 9.0 8.9 9.3 10.3  HGB 8.1* 7.5* 8.1* 8.1* 8.2*  HCT 26.1* 23.8* 25.9* 26.1* 27.0*  MCV 84.5 83.2 83.8 83.9 87.9  PLT 304 240 217 213 [id number]   Basic Metabolic Panel:  Recent Labs Lab 10/12/15 0455 10/13/15 0434 10/14/15 0315 10/15/15 0415 10/16/15 0420  NA 133* 135 137 137 140  K 4.6 3.8 3.8 4.0 4.3  CL 99* 99* 99* 102 107  CO2 17* 25 25 24 26   GLUCOSE 97 144* 96 129* 113*  BUN 118* 62* 68* 68* 59*  CREATININE 7.31* 4.54* 4.73* 4.31* 3.58*  CALCIUM 8.5* 8.1* 8.0* 8.3* 8.6*  PHOS 9.0* 5.3* 5.5* 4.3 3.9   GFR: Estimated Creatinine Clearance: 18.1 mL/min (by C-G formula based on Cr of 3.58). Liver Function Tests:  Recent Labs Lab 10/12/15 0455 10/13/15 0434 10/14/15 0315 10/15/15 0415 10/16/15 0420  ALBUMIN 2.5* 2.3* 2.6* 2.6* 2.6*   No results for input(s): LIPASE, AMYLASE in  the last 168 hours. No results for input(s): AMMONIA in the last 168 hours. Coagulation Profile:  Recent Labs Lab 10/15/15 0415  INR 1.23   Cardiac Enzymes:  Recent Labs Lab 10/11/15 0322  CKTOTAL 30*   BNP (last 3 results) No results for input(s): PROBNP in the last 8760 hours. HbA1C: No results for input(s): HGBA1C in the last 72 hours. CBG:  Recent Labs Lab 10/15/15 1117 10/15/15 1208 10/15/15 1631 10/15/15 2106 10/16/15 0633  GLUCAP 165* 152* 175* 113* 114*   Lipid Profile: No results for input(s): CHOL, HDL, LDLCALC, TRIG, CHOLHDL, LDLDIRECT in the last 72 hours. Thyroid Function Tests: No results for input(s): TSH, T4TOTAL, FREET4, T3FREE, THYROIDAB in the last 72 hours. Anemia Panel: No results for input(s): VITAMINB12, FOLATE, FERRITIN, TIBC, IRON, RETICCTPCT in the last 72 hours. Urine analysis:    Component Value Date/Time   COLORURINE YELLOW 10/04/2015 0030   APPEARANCEUR HAZY* 10/04/2015 0030   LABSPEC 1.014 10/04/2015 0030   PHURINE 5.5 10/04/2015 0030   GLUCOSEU NEGATIVE 10/04/2015 0030   HGBUR NEGATIVE 10/04/2015 0030   BILIRUBINUR NEGATIVE  10/04/2015 0030   KETONESUR NEGATIVE 10/04/2015 0030   PROTEINUR 100* 10/04/2015 0030   UROBILINOGEN 0.2 03/02/2014 1508   NITRITE NEGATIVE 10/04/2015 0030   LEUKOCYTESUR NEGATIVE 10/04/2015 0030   Sepsis Labs: Invalid input(s): PROCALCITONIN, LACTICIDVEN  No results found for this or any previous visit (from the past 240 hour(s)).    Radiology Studies: No results found.   Scheduled Meds: . sodium chloride   Intravenous Once  . amiodarone  200 mg Oral Daily  . amLODipine  10 mg Oral Daily  . aspirin EC  81 mg Oral Daily  . atorvastatin  80 mg Oral QPM  . calcium acetate  1,334 mg Oral TID WC  . insulin aspart  0-20 Units Subcutaneous TID WC  . insulin aspart  0-5 Units Subcutaneous QHS  . insulin aspart protamine- aspart  15 Units Subcutaneous BID WC  . isosorbide-hydrALAZINE  1 tablet Oral TID  . loratadine  10 mg Oral Daily  . metoprolol tartrate  25 mg Oral BID  . sertraline  100 mg Oral Daily  . sodium chloride flush  10-40 mL Intracatheter Q12H  . Warfarin - Pharmacist Dosing Inpatient   Does not apply q1800   Continuous Infusions: . sodium chloride 10 mL/hr at 10/07/15 2214  . heparin 1,750 Units/hr (10/16/15 1031)      Marzetta Board, MD, PhD [facility] Pager [phone]  If 7PM-7AM, please contact night-coverage [email or web address] Password [facility] 10/16/2015, 10:48 AM

## 2015-10-16 NOTE — Progress Notes (Signed)
ANTICOAGULATION CONSULT NOTE - Follow Up Consult  Pharmacy Consult for Heparin / Coumadin Indication: atrial fibrillation  Allergies  Allergen Reactions  . Carbamazepine Other (See Comments)    Reaction to tegretol - loopy  . Clonazepam Other (See Comments)    Unknown reaction  . Gemfibrozil Other (See Comments)    Patient is not aware of this allergy but states that she has side effects to a cholesterol medication in the past  . Macrobid [Nitrofurantoin] Nausea And Vomiting  . Oxycodone Other (See Comments)    hallucinations    Patient Measurements: Height: 5\' 5"  (165.1 cm) Weight: 242 lb 3.2 oz (109.861 kg) (c scale) IBW/kg (Calculated) : 57 Heparin Dosing Weight: 80 kg  Vital Signs: Temp: 98.6 F (37 C) (05/22 0739) Temp Source: Oral (05/22 0739) BP: 154/41 mmHg (05/22 0942) Pulse Rate: 54 (05/22 0943)  Labs:  Recent Labs  10/14/15 0315 10/15/15 0415 10/15/15 1441 10/15/15 2350 10/16/15 0420  HGB 8.1* 8.1*  --   --  8.2*  HCT 25.9* 26.1*  --   --  27.0*  PLT 217 213  --   --  211  LABPROT  --  15.6*  --   --   --   INR  --  1.23  --   --   --   HEPARINUNFRC 0.38 <0.10* 0.20* 0.47 0.59  CREATININE 4.73* 4.31*  --   --  3.58*    Estimated Creatinine Clearance: 18.1 mL/min (by C-G formula based on Cr of 3.58).  Assessment: 70 y/o F for heparin bridge to coumadin for new onset AFib.  Heparin level therapeutic this AM CBC stable   Goal of Therapy:  Heparin level 0.3-0.7 units/ml Monitor platelets by anticoagulation protocol: Yes  INR goal = 2 to 3   Plan:  Coumadin 5 mg po x 1 today Continue heparin at 1750 units / hr Follow up AM labs  Thank you Anette Guarneri, PharmD [phone] 10/16/2015,10:53 AM

## 2015-10-16 NOTE — Clinical Social Work Note (Signed)
CSW met with patient. Sister-in-law at bedside. CSW presented bed offers. First preference as SNF backup is [facility] in [place] due to proximity to her husband. CSW will continue to follow progress with CIR.  Dayton Scrape, [place]

## 2015-10-16 NOTE — Progress Notes (Signed)
Rehab admissions - I spoke with attending MD.  Patient not medically ready for inpatient rehab today.  Will follow up tomorrow.  Call me for questions.  RC:[id number]

## 2015-10-16 NOTE — Progress Notes (Signed)
Occupational Therapy Treatment Patient Details Name: Briana [name] MRN: [id number] DOB: June 03, 1945 Today's Date: 10/16/2015    History of present illness Pt adm with acute hypoxic Respiratory Failure in setting of bilateral pulmonary infiltrates. Favor acute Pulmonary Edema over PNA. Pt placed on bipap. PMH - DM, CAD, diastolic HF,    OT comments  Pt demonstrates improving activity tolerance.  She is able to perform ADLs with min A - min guard assist.  02 sats decreased to 88% on 2L supplemental 02 during activity.     Follow Up Recommendations  CIR    Equipment Recommendations  None recommended by OT    Recommendations for Other Services      Precautions / Restrictions Precautions Precautions: Fall Precaution Comments: monitor HR an O2 sats Restrictions Weight Bearing Restrictions: No       Mobility Bed Mobility               General bed mobility comments: Pt sitting in recliner chair upon PT arrival  Transfers Overall transfer level: Needs assistance Equipment used: Rolling walker (2 wheeled) Transfers: Sit to/from [facility] Sit to Stand: Supervision Stand pivot transfers: Min guard       General transfer comment: Supervision for safety    Balance Overall balance assessment: Needs assistance Sitting-balance support: Feet supported Sitting balance-Leahy Scale: Good     Standing balance support: During functional activity Standing balance-Leahy Scale: Fair Standing balance comment: RW for dynamic activity                   ADL Overall ADL's : Needs assistance/impaired     Grooming: Wash/dry hands;Wash/dry face;Min Dispensing optician: Min guard;Ambulation;Regular Toilet;Comfort height toilet;Grab bars;RW   Toileting- Clothing Manipulation and Hygiene: Sit to/from stand;Supervision/safety       Functional mobility during ADLs: Interior and spatial designer      Cognition   Behavior During Therapy: WFL for tasks assessed/performed Overall Cognitive Status: Within Functional Limits for tasks assessed                       Extremity/Trunk Assessment               Exercises General Exercises - Lower Extremity Ankle Circles/Pumps: AROM;Both;10 reps;Seated Long Arc Quad: Strengthening;Both;5 reps;Seated;Other (comment) (w/ manual resistance) Other Exercises Other Exercises: Encourged pt to ambulate w/ nursing staff 3/day   Shoulder Instructions       General Comments      Pertinent Vitals/ Pain       Pain Assessment: Faces Pain Score: 7  Faces Pain Scale: Hurts little more Pain Location: back  Pain Descriptors / Indicators: Aching Pain Intervention(s): Monitored during session  Home Living                                          Prior Functioning/Environment              Frequency Min 2X/week     Progress Toward Goals  OT Goals(current goals can now be found in the care plan section)  Progress towards OT  goals: Progressing toward goals (goals updated )  Acute Rehab OT Goals Patient Stated Goal: to get her strength back OT Goal Formulation: With patient Time For Goal Achievement: 10/29/15 Potential to Achieve Goals: Good ADL Goals Pt Will Perform Grooming: with set-up;with supervision;standing Pt Will Perform Upper Body Bathing: with set-up;with supervision;standing;sitting Pt Will Perform Lower Body Bathing: with set-up;with supervision;sit to/from stand Pt Will Perform Upper Body Dressing: with set-up;with supervision;sitting;standing Pt Will Perform Lower Body Dressing: with set-up;with supervision;sit to/from stand Pt Will Transfer to Toilet: with supervision;ambulating;bedside commode Pt Will Perform Toileting - Clothing Manipulation and hygiene: with supervision;sit to/from stand Additional ADL Goal #1: Pt will be Mod I in and OOB  for basic ADLs  Plan Discharge plan remains appropriate    Co-evaluation                 End of Session Equipment Utilized During Treatment: Rolling walker;Oxygen   Activity Tolerance Patient tolerated treatment well   Patient Left in chair;with call bell/phone within reach;with chair alarm set;with family/visitor present   Nurse Communication Mobility status        Time: [id number] OT Time Calculation (min): 36 min  Charges: OT General Charges $OT Visit: 1 Procedure OT Treatments $Self Care/Home Management : 8-22 mins $Therapeutic Activity: 8-22 mins  Kinsley Holderman M 10/16/2015, 5:18 PM

## 2015-10-17 ENCOUNTER — Ambulatory Visit: Payer: Medicare Other | Admitting: Vascular Surgery

## 2015-10-17 DIAGNOSIS — N179 Acute kidney failure, unspecified: Secondary | ICD-10-CM | POA: Insufficient documentation

## 2015-10-17 LAB — BASIC METABOLIC PANEL
Anion gap: 8 (ref 5–15)
BUN: 58 mg/dL — AB (ref 6–20)
CHLORIDE: 107 mmol/L (ref 101–111)
CO2: 25 mmol/L (ref 22–32)
CREATININE: 3.11 mg/dL — AB (ref 0.44–1.00)
Calcium: 8.6 mg/dL — ABNORMAL LOW (ref 8.9–10.3)
GFR, EST AFRICAN AMERICAN: 16 mL/min — AB (ref >60)
GFR, EST NON AFRICAN AMERICAN: 14 mL/min — AB (ref >60)
Glucose, Bld: 136 mg/dL — ABNORMAL HIGH (ref 65–99)
POTASSIUM: 4.6 mmol/L (ref 3.5–5.1)
SODIUM: 140 mmol/L (ref 135–145)

## 2015-10-17 LAB — GLUCOSE, CAPILLARY
GLUCOSE-CAPILLARY: 223 mg/dL — AB (ref 65–99)
GLUCOSE-CAPILLARY: 186 mg/dL — AB (ref 65–99)
Glucose-Capillary: 135 mg/dL — ABNORMAL HIGH (ref 65–99)
Glucose-Capillary: 141 mg/dL — ABNORMAL HIGH (ref 65–99)

## 2015-10-17 LAB — PROTIME-INR
INR: 1.77 — AB (ref 0.00–1.49)
PROTHROMBIN TIME: 20.6 s — AB (ref 11.6–15.2)

## 2015-10-17 LAB — FERRITIN: FERRITIN: 171 ng/mL (ref 11–307)

## 2015-10-17 LAB — HEPARIN LEVEL (UNFRACTIONATED): Heparin Unfractionated: 0.7 IU/mL (ref 0.30–0.70)

## 2015-10-17 LAB — CBC
HCT: 26.5 % — ABNORMAL LOW (ref 36.0–46.0)
HEMOGLOBIN: 8.1 g/dL — AB (ref 12.0–15.0)
MCH: 26.6 pg (ref 26.0–34.0)
MCHC: 30.6 g/dL (ref 30.0–36.0)
MCV: 86.9 fL (ref 78.0–100.0)
PLATELETS: 209 10*3/uL (ref 150–400)
RBC: 3.05 MIL/uL — AB (ref 3.87–5.11)
RDW: 15.7 % — ABNORMAL HIGH (ref 11.5–15.5)
WBC: 9.2 10*3/uL (ref 4.0–10.5)

## 2015-10-17 LAB — IRON AND TIBC
IRON: 34 ug/dL (ref 28–170)
Saturation Ratios: 12 % (ref 10.4–31.8)
TIBC: 288 ug/dL (ref 250–450)
UIBC: 254 ug/dL

## 2015-10-17 MED ORDER — WARFARIN SODIUM 5 MG PO TABS
5.0000 mg | ORAL_TABLET | Freq: Once | ORAL | Status: AC
  Administered 2015-10-17: 5 mg via ORAL
  Filled 2015-10-17: qty 1

## 2015-10-17 MED ORDER — FUROSEMIDE 10 MG/ML IJ SOLN
40.0000 mg | Freq: Two times a day (BID) | INTRAMUSCULAR | Status: AC
  Administered 2015-10-17 – 2015-10-18 (×3): 40 mg via INTRAVENOUS
  Filled 2015-10-17 (×3): qty 4

## 2015-10-17 NOTE — Progress Notes (Signed)
ANTICOAGULATION CONSULT NOTE - Follow Up Consult  Pharmacy Consult for Heparin / Coumadin Indication: atrial fibrillation  Allergies  Allergen Reactions  . Carbamazepine Other (See Comments)    Reaction to tegretol - loopy  . Clonazepam Other (See Comments)    Unknown reaction  . Gemfibrozil Other (See Comments)    Patient is not aware of this allergy but states that she has side effects to a cholesterol medication in the past  . Macrobid [Nitrofurantoin] Nausea And Vomiting  . Oxycodone Other (See Comments)    hallucinations    Patient Measurements: Height: 5\' 5"  (165.1 cm) Weight: 243 lb 3.2 oz (110.315 kg) IBW/kg (Calculated) : 57 Heparin Dosing Weight: 80 kg  Vital Signs: Temp: 98.9 F (37.2 C) (05/23 0501) Temp Source: Oral (05/23 0501) BP: 146/44 mmHg (05/23 0501) Pulse Rate: 51 (05/23 0501)  Labs:  Recent Labs  10/15/15 0415  10/15/15 2350 10/16/15 0420 10/17/15 0430  HGB 8.1*  --   --  8.2* 8.1*  HCT 26.1*  --   --  27.0* 26.5*  PLT 213  --   --  211 209  LABPROT 15.6*  --   --   --  20.6*  INR 1.23  --   --   --  1.77*  HEPARINUNFRC <0.10*  < > 0.47 0.59 0.70  CREATININE 4.31*  --   --  3.58* 3.11*  < > = values in this interval not displayed.  Estimated Creatinine Clearance: 20.8 mL/min (by C-G formula based on Cr of 3.11).  Assessment: 70 y/o F for heparin bridge to coumadin for new onset AFib.  Heparin level therapeutic this AM, but close to supra-therapeutic CBC stable  INR = 1.77 today -- trending up   Goal of Therapy:  Heparin level 0.3-0.7 units/ml Monitor platelets by anticoagulation protocol: Yes  INR goal = 2 to 3   Plan:  Coumadin 5 mg po x 1 today Heparin to 1700 units / hr Follow up AM labs  Thank you Anette Guarneri, PharmD [phone] 10/17/2015,9:40 AM

## 2015-10-17 NOTE — Progress Notes (Addendum)
       Patient Name: Briana [name] Date of Encounter: 10/17/2015    SUBJECTIVE: Breathing okay.  TELEMETRY:  NSR/ SB Filed Vitals:   10/16/15 0942 10/16/15 0943 10/16/15 1531 10/17/15 0501  BP: 154/41  149/61 146/44  Pulse:  54 52 51  Temp:   98.5 F (36.9 C) 98.9 F (37.2 C)  TempSrc:   Oral Oral  Resp:   18 19  Height:      Weight:    243 lb 3.2 oz (110.315 kg)  SpO2:   95% 92%    Intake/Output Summary (Last 24 hours) at 10/17/15 1139 Last data filed at 10/17/15 0946  Gross per 24 hour  Intake 1615.42 ml  Output    200 ml  Net 1415.42 ml   LABS: Basic Metabolic Panel:  Recent Labs  10/15/15 0415 10/16/15 0420 10/17/15 0430  NA 137 140 140  K 4.0 4.3 4.6  CL 102 107 107  CO2 24 26 25   GLUCOSE 129* 113* 136*  BUN 68* 59* 58*  CREATININE 4.31* 3.58* 3.11*  CALCIUM 8.3* 8.6* 8.6*  PHOS 4.3 3.9  --    CBC:  Recent Labs  10/16/15 0420 10/17/15 0430  WBC 10.3 9.2  HGB 8.2* 8.1*  HCT 27.0* 26.5*  MCV 87.9 86.9  PLT 211 209     Radiology/Studies:  No new data  Physical Exam: Blood pressure 146/44, pulse 51, temperature 98.9 F (37.2 C), temperature source Oral, resp. rate 19, height 5\' 5"  (1.651 m), weight 243 lb 3.2 oz (110.315 kg), SpO2 92 %. Weight change: 1 lb (0.454 kg)  Wt Readings from Last 3 Encounters:  10/17/15 243 lb 3.2 oz (110.315 kg)  09/25/15 226 lb (102.513 kg)  08/17/15 222 lb 12.8 oz (101.061 kg)   Mild JVD Chest clear Cardiac with soft systolic murmur  ASSESSMENT:  1. NSTEMI, asymptomatic. Cath delayed indefinitely until more stable kidney function. 2. PAF with rhythm control on amiodarone 3. Chronic diastolic HF with reasonable volume status.  Plan:  1. Continue current CV therapy and monitor rhythm. 2. Probably needs more aggressive diuresis.   Signed, Belva Crome III 10/17/2015, 11:39 AM

## 2015-10-17 NOTE — Progress Notes (Signed)
PROGRESS NOTE  Briana [name] [id number] DOB: 1945/06/24 DOA: 09/30/2015 PCP: Neysa Hotter B, PA-C   LOS: 16 days   Brief Narrative/interval history: Briana [name] is an 70 y.o. WF PMHx Depression with AnxietyDM Type 2 with complication (neuropathy), CAD native artery, PVD, Chronic Diastolic CHF, Heart murmur, OSA, HTN, HLD,Adenomatous colon polyp, CKD, who presented to the ED at [facility] on the evening of 5/6 with 24 hours of chest pain. In ED found to have: hypoxia, a fever to 101.6, WBC of 15.5, EKG changes (lateral ST depression), mild troponin elevation (0.12). BNP was elevated to 894.0, previously was 391. She had a CXR which showed alveolar opacities, most dense in the Rt base. Cultures were obtained, she was started on CAP coverage, and due to the hypoxia was transferred to [facility] for tx of pneumonia. Patient was treated for her pneumonia and completed an antibiotic course was hospitalized. She was also found to have A. fib status post cardioversion by cardiology. During her hospital stay, she has had progressive renal failure with significant fluid overload, requiring hemodialysis 1. Fortunately, her kidney function is starting to recover off of hemodialysis, and her diuretics have been restarted on 5/22.   Assessment & Plan: Active Problems:   Acute on chronic diastolic CHF (congestive heart failure) (HCC)   Acute respiratory failure (HCC)   Hypoxia   Elevated troponin   Chest pain   DVT (deep venous thrombosis) (HCC)   Hypoxemia   Acute respiratory failure with hypoxia (HCC)   Acute pulmonary edema (HCC)   Diastolic CHF, acute on chronic (HCC)   Acute blood loss anemia   Controlled diabetes mellitus type 2 with complications (HCC)   Atrial fibrillation with rapid ventricular response ([place])   CAP (community acquired pneumonia)   CAD in native artery   Pulmonary hypertension ([place])   Atrial fibrillation with RVR ([place])   Acute on chronic renal failure ([place])  Uncontrolled type 2 diabetes mellitus with complication (HCC)   Physical deconditioning   Pulmonary edema   Abnormal nuclear stress test   Acute renal failure with tubular necrosis (HCC)   Hypertensive emergency   Non-ST elevation myocardial infarction (NSTEMI) ([place])   Acute hypoxic Respiratory Failure in setting of bilateral pulmonary infiltrates. PNA vs Pulmonary Edema  - completed course of antibiotics 5 days - currently on [place], wean oxygen as tolerated  - Continue diuresis per nephrology  CAP - Completed 5 day course antibiotics - Afebrile, no leukocytosis  CAD native artery / NSTEMI - patient with chest pain, positive troponin - cardiology following and for now recommending medical management given CKD to avoid cath which can be done as outpatient.   Acute on chronic Diastolic CHF/Pulmonary Hypertension - Daily weights, fluid management per renal   A-Fib RVR/RBBB(CHA2DS2 VASc score is 6+) - EKG shows A. fib RVR with RBBB - s/p DCCV 5/15  - heparin / Coumadin, INR 1.77 this morning   HTN emergency w/ associated pulmonary edema  - BP146/44, controlled, continue current management with Amlodipine, Bidil, Metoprolol,   Acute on CKD (baseline Cr~1.6 -2) - Patient with progressive renal failure requiring hemodialysis 1 - Following HD her kidney function has recovered on its own, creatinine improving to 3.1 today - Nephrology following  Acute blood loss anemia vs anemia of critical illness - Hemoglobin stable  - hemoccult negative  - Hemoglobin stable, 8.1 this morning  Leukocytosis - Resolved   DM Type 2 controlled with complication - 123456 hemoglobin A1c= 6.9  - NovoLog 70/30 15  units BID - Resistant SSI  Depression - Zoloft 100 mg daily   Deconditioning - Improving well, working more and more physical therapy, initially planned for her to go to CIR however per CIR notes patient is improving rapidly and may be able to go home   DVT prophylaxis: heparin  gtt / Coumadin Code Status: Full Family Communication: no family bedside Disposition Plan: TBD  Consultants:   Cardiology   Nephrology   Pulmonary  Procedures:  CXR -- alveolar filling pattern, most dense in RLL. EKG - lateral ST depression, TWI throughout 5/7 Bilat LE Doppler; Negative DVT/SVT 5/8 echocardiogram;Left ventricle: mild focal basal hypertrophy of the septum. 55% to 60%. -(grade 3 diastolic dysfunction).-Left atrium: severely dilated. - Tricuspid valve: moderate regurgitation. - Pulmonary arteries: PA peak pressure: 69 mm Hg (S). 5/9 Transfuse 1 unit PRBC 5/11 renal ultrasound;-Low normal renal cortical thickness, a finding that may be seen with medical renal disease. . 5/12 RVR persists  5/15 TEE w/ DCCV > sinus bradycardia  -5/19 transfuse 1 unit PRBC  Antimicrobials: Azithromycin 5/6 >>5/10  Ceftriaxone 5/6 >> 5/11   Subjective: - complains of generalized swelling. No chest pain, breathing has improved - Able to ambulate in the hallway yesterday  - Complains of being on a bed alarm  Objective: Filed Vitals:   10/16/15 0942 10/16/15 0943 10/16/15 1531 10/17/15 0501  BP: 154/41  149/61 146/44  Pulse:  54 52 51  Temp:   98.5 F (36.9 C) 98.9 F (37.2 C)  TempSrc:   Oral Oral  Resp:   18 19  Height:      Weight:    110.315 kg (243 lb 3.2 oz)  SpO2:   95% 92%    Intake/Output Summary (Last 24 hours) at 10/17/15 1112 Last data filed at 10/17/15 0946  Gross per 24 hour  Intake 1615.42 ml  Output    200 ml  Net 1415.42 ml   Filed Weights   10/15/15 1247 10/16/15 0541 10/17/15 0501  Weight: 109.861 kg (242 lb 3.2 oz) 109.861 kg (242 lb 3.2 oz) 110.315 kg (243 lb 3.2 oz)    Examination: Constitutional: NAD Filed Vitals:   10/16/15 0942 10/16/15 0943 10/16/15 1531 10/17/15 0501  BP: 154/41  149/61 146/44  Pulse:  54 52 51  Temp:   98.5 F (36.9 C) 98.9 F (37.2 C)  TempSrc:   Oral Oral  Resp:   18 19  Height:      Weight:    110.315 kg  (243 lb 3.2 oz)  SpO2:   95% 92%   ENMT: Mucous membranes are moist.  Neck: normal, supple, no masses, no thyromegaly Respiratory: clear to auscultation bilaterally, no wheezing, no crackles. Normal respiratory effort. No accessory muscle use.  Cardiovascular: Regular rate and rhythm, 3/6 SEM. 2+ pitting edema. 2+ pedal pulses. No carotid bruits.  Abdomen: no tenderness. Bowel sounds positive.  Neurologic: non focal   Psychiatric: Normal judgment and insight. Alert and oriented x 3. Normal mood.    Data Reviewed: I have personally reviewed following labs and imaging studies  CBC:  Recent Labs Lab 10/13/15 0436 10/14/15 0315 10/15/15 0415 10/16/15 0420 10/17/15 0430  WBC 9.0 8.9 9.3 10.3 9.2  HGB 7.5* 8.1* 8.1* 8.2* 8.1*  HCT 23.8* 25.9* 26.1* 27.0* 26.5*  MCV 83.2 83.8 83.9 87.9 86.9  PLT 240 217 213 211 [id number]   Basic Metabolic Panel:  Recent Labs Lab 10/12/15 0455 10/13/15 0434 10/14/15 0315 10/15/15 0415 10/16/15 0420 10/17/15 0430  NA  133* 135 137 137 140 140  K 4.6 3.8 3.8 4.0 4.3 4.6  CL 99* 99* 99* 102 107 107  CO2 17* 25 25 24 26 25   GLUCOSE 97 144* 96 129* 113* 136*  BUN 118* 62* 68* 68* 59* 58*  CREATININE 7.31* 4.54* 4.73* 4.31* 3.58* 3.11*  CALCIUM 8.5* 8.1* 8.0* 8.3* 8.6* 8.6*  PHOS 9.0* 5.3* 5.5* 4.3 3.9  --    GFR: Estimated Creatinine Clearance: 20.8 mL/min (by C-G formula based on Cr of 3.11). Liver Function Tests:  Recent Labs Lab 10/12/15 0455 10/13/15 0434 10/14/15 0315 10/15/15 0415 10/16/15 0420  ALBUMIN 2.5* 2.3* 2.6* 2.6* 2.6*   No results for input(s): LIPASE, AMYLASE in the last 168 hours. No results for input(s): AMMONIA in the last 168 hours. Coagulation Profile:  Recent Labs Lab 10/15/15 0415 10/17/15 0430  INR 1.23 1.77*   Cardiac Enzymes:  Recent Labs Lab 10/11/15 0322  CKTOTAL 30*   BNP (last 3 results) No results for input(s): PROBNP in the last 8760 hours. HbA1C: No results for input(s): HGBA1C in the  last 72 hours. CBG:  Recent Labs Lab 10/16/15 0633 10/16/15 1142 10/16/15 1702 10/16/15 2046 10/17/15 0635  GLUCAP 114* 160* 156* 193* 135*   Lipid Profile: No results for input(s): CHOL, HDL, LDLCALC, TRIG, CHOLHDL, LDLDIRECT in the last 72 hours. Thyroid Function Tests: No results for input(s): TSH, T4TOTAL, FREET4, T3FREE, THYROIDAB in the last 72 hours. Anemia Panel: No results for input(s): VITAMINB12, FOLATE, FERRITIN, TIBC, IRON, RETICCTPCT in the last 72 hours. Urine analysis:    Component Value Date/Time   COLORURINE YELLOW 10/04/2015 0030   APPEARANCEUR HAZY* 10/04/2015 0030   LABSPEC 1.014 10/04/2015 0030   PHURINE 5.5 10/04/2015 0030   GLUCOSEU NEGATIVE 10/04/2015 0030   HGBUR NEGATIVE 10/04/2015 0030   BILIRUBINUR NEGATIVE 10/04/2015 0030   KETONESUR NEGATIVE 10/04/2015 0030   PROTEINUR 100* 10/04/2015 0030   UROBILINOGEN 0.2 03/02/2014 1508   NITRITE NEGATIVE 10/04/2015 0030   LEUKOCYTESUR NEGATIVE 10/04/2015 0030   Sepsis Labs: Invalid input(s): PROCALCITONIN, LACTICIDVEN  No results found for this or any previous visit (from the past 240 hour(s)).    Radiology Studies: No results found.   Scheduled Meds: . sodium chloride   Intravenous Once  . amiodarone  200 mg Oral Daily  . amLODipine  10 mg Oral Daily  . aspirin EC  81 mg Oral Daily  . atorvastatin  80 mg Oral QPM  . calcium acetate  1,334 mg Oral TID WC  . furosemide  40 mg Oral Daily  . insulin aspart  0-20 Units Subcutaneous TID WC  . insulin aspart  0-5 Units Subcutaneous QHS  . insulin aspart protamine- aspart  15 Units Subcutaneous BID WC  . isosorbide-hydrALAZINE  1 tablet Oral TID  . loratadine  10 mg Oral Daily  . metoprolol tartrate  25 mg Oral BID  . sertraline  100 mg Oral Daily  . sodium chloride flush  10-40 mL Intracatheter Q12H  . warfarin  5 mg Oral ONCE-1800  . Warfarin - Pharmacist Dosing Inpatient   Does not apply q1800   Continuous Infusions: . sodium chloride 10  mL/hr at 10/07/15 2214  . heparin 1,700 Units/hr (10/17/15 0941)      Marzetta Board, MD, PhD [facility] Pager [phone]  If 7PM-7AM, please contact night-coverage [email or web address] Password [email or web address] 10/17/2015, 11:12 AM

## 2015-10-17 NOTE — Care Management Important Message (Signed)
Important Message  Patient Details  Name: Briana [name] MRN: [id number] Date of Birth: 08-27-1945   Medicare Important Message Given:  Yes    Loann Quill 10/17/2015, 2:07 PM

## 2015-10-17 NOTE — Progress Notes (Signed)
Rehab admissions - Patient is making good progress.  By the time patient is medically ready, she may not need an inpatient rehab stay.  Call me for questions.  [id number]

## 2015-10-17 NOTE — Progress Notes (Signed)
Patient ID: Briana [name], female   DOB: 1945-12-29, 70 y.o.   MRN: [id number]  [facility] Progress Note   Assessment/ Plan:   1. AKI on Chronic kidney disease stage III (baseline creatinine 1.6-2.3) likely hemodynamically mediated with acute diastolic heart failure as well as A. fib with RVR requiring DCCV. Creatinine appears to be better on labs noted this morning indicating some improvement of renal function. Urine output charted appears to be inaccurate especially with furosemide started yesterday-given her volume overload, will increase this to 40 mg IV twice a day for today and convert to 80 mg furosemide daily starting tomorrow. 2. Acute diastolic heart failure exacerbation: Appears to be exacerbated primarily by atrial fibrillation with RVR-currently appears improved (further helped by improving renal function/urine output). Complains of some shortness of breath this morning along with worsening pedal edema. 3. Community acquired pneumonia: Status post completion of therapy with azithromycin and ceftriaxone-afebrile. 4. Atrial fibrillation status post DC CV: Currently appears to be in sinus rhythm, on amiodarone/warfarin 5. Anemia: Check iron studies and replace this prior to initiating ESA.  Subjective:   Complains of some shortness of breath this morning-slept in a recliner last night because of orthopnea. Denies any chest pain.    Objective:   BP 146/44 mmHg  Pulse 51  Temp(Src) 98.9 F (37.2 C) (Oral)  Resp 19  Ht 5\' 5"  (1.651 m)  Wt 110.315 kg (243 lb 3.2 oz)  BMI 40.47 kg/m2  SpO2 92%  Intake/Output Summary (Last 24 hours) at 10/17/15 1132 Last data filed at 10/17/15 0946  Gross per 24 hour  Intake 1615.42 ml  Output    200 ml  Net 1415.42 ml   Weight change: 0.454 kg (1 lb)  Physical Exam: [id number] comfortably up in her recliner, talking to her daughter sitting across her CVS: Pulse regular rhythm, S1 and S2 normal Resp: Decreased breath  sounds at bases otherwise clear Abd: Soft, obese, nontender Ext: 3+ to 4+ pitting edema  Imaging: No results found.  Labs: BMET  Recent Labs Lab 10/11/15 0322 10/12/15 0455 10/13/15 0434 10/14/15 0315 10/15/15 0415 10/16/15 0420 10/17/15 0430  NA 135 133* 135 137 137 140 140  K 4.6 4.6 3.8 3.8 4.0 4.3 4.6  CL 101 99* 99* 99* 102 107 107  CO2 17* 17* 25 25 24 26 25   GLUCOSE 110* 97 144* 96 129* 113* 136*  BUN 111* 118* 62* 68* 68* 59* 58*  CREATININE 6.47* 7.31* 4.54* 4.73* 4.31* 3.58* 3.11*  CALCIUM 8.6* 8.5* 8.1* 8.0* 8.3* 8.6* 8.6*  PHOS 8.5* 9.0* 5.3* 5.5* 4.3 3.9  --    CBC  Recent Labs Lab 10/14/15 0315 10/15/15 0415 10/16/15 0420 10/17/15 0430  WBC 8.9 9.3 10.3 9.2  HGB 8.1* 8.1* 8.2* 8.1*  HCT 25.9* 26.1* 27.0* 26.5*  MCV 83.8 83.9 87.9 86.9  PLT 217 213 211 209    Medications:    . sodium chloride   Intravenous Once  . amiodarone  200 mg Oral Daily  . amLODipine  10 mg Oral Daily  . aspirin EC  81 mg Oral Daily  . atorvastatin  80 mg Oral QPM  . calcium acetate  1,334 mg Oral TID WC  . furosemide  40 mg Oral Daily  . insulin aspart  0-20 Units Subcutaneous TID WC  . insulin aspart  0-5 Units Subcutaneous QHS  . insulin aspart protamine- aspart  15 Units Subcutaneous BID WC  . isosorbide-hydrALAZINE  1 tablet Oral TID  .  loratadine  10 mg Oral Daily  . metoprolol tartrate  25 mg Oral BID  . sertraline  100 mg Oral Daily  . sodium chloride flush  10-40 mL Intracatheter Q12H  . warfarin  5 mg Oral ONCE-1800  . Warfarin - Pharmacist Dosing Inpatient   Does not apply [id number]   Elmarie Shiley, MD 10/17/2015, 11:32 AM

## 2015-10-17 NOTE — Progress Notes (Addendum)
Pt AOx4. Pt instructed of high risk fall potential while hospitalized. Pt refused bed alarm precaution and wishes to "get up ad lib" with risk. Will continue to utilize other fall risk precautions with pt. Dr. Cruzita Lederer aware of pt wanting to get up ad lib.   Maurene Capes RN

## 2015-10-18 ENCOUNTER — Inpatient Hospital Stay (HOSPITAL_COMMUNITY)
Admission: RE | Admit: 2015-10-18 | Discharge: 2015-10-25 | DRG: 945 | Disposition: A | Discharge status: Home Health | Payer: Medicare Other | Source: Intra-hospital | Attending: Physical Medicine & Rehabilitation | Admitting: Physical Medicine & Rehabilitation

## 2015-10-18 ENCOUNTER — Encounter (HOSPITAL_COMMUNITY): Payer: Self-pay | Admitting: *Deleted

## 2015-10-18 DIAGNOSIS — D638 Anemia in other chronic diseases classified elsewhere: Secondary | ICD-10-CM | POA: Diagnosis present

## 2015-10-18 DIAGNOSIS — G4730 Sleep apnea, unspecified: Secondary | ICD-10-CM | POA: Diagnosis present

## 2015-10-18 DIAGNOSIS — E669 Obesity, unspecified: Secondary | ICD-10-CM

## 2015-10-18 DIAGNOSIS — Z951 Presence of aortocoronary bypass graft: Secondary | ICD-10-CM

## 2015-10-18 DIAGNOSIS — N186 End stage renal disease: Secondary | ICD-10-CM | POA: Diagnosis present

## 2015-10-18 DIAGNOSIS — E46 Unspecified protein-calorie malnutrition: Secondary | ICD-10-CM | POA: Diagnosis present

## 2015-10-18 DIAGNOSIS — I252 Old myocardial infarction: Secondary | ICD-10-CM

## 2015-10-18 DIAGNOSIS — E785 Hyperlipidemia, unspecified: Secondary | ICD-10-CM | POA: Diagnosis present

## 2015-10-18 DIAGNOSIS — I214 Non-ST elevation (NSTEMI) myocardial infarction: Secondary | ICD-10-CM | POA: Diagnosis present

## 2015-10-18 DIAGNOSIS — D62 Acute posthemorrhagic anemia: Secondary | ICD-10-CM | POA: Diagnosis present

## 2015-10-18 DIAGNOSIS — Z7901 Long term (current) use of anticoagulants: Secondary | ICD-10-CM

## 2015-10-18 DIAGNOSIS — Z7982 Long term (current) use of aspirin: Secondary | ICD-10-CM | POA: Diagnosis not present

## 2015-10-18 DIAGNOSIS — R269 Unspecified abnormalities of gait and mobility: Secondary | ICD-10-CM | POA: Diagnosis present

## 2015-10-18 DIAGNOSIS — E1122 Type 2 diabetes mellitus with diabetic chronic kidney disease: Secondary | ICD-10-CM | POA: Diagnosis present

## 2015-10-18 DIAGNOSIS — Z888 Allergy status to other drugs, medicaments and biological substances status: Secondary | ICD-10-CM | POA: Diagnosis not present

## 2015-10-18 DIAGNOSIS — N184 Chronic kidney disease, stage 4 (severe): Secondary | ICD-10-CM | POA: Diagnosis present

## 2015-10-18 DIAGNOSIS — G894 Chronic pain syndrome: Secondary | ICD-10-CM | POA: Diagnosis present

## 2015-10-18 DIAGNOSIS — Z833 Family history of diabetes mellitus: Secondary | ICD-10-CM | POA: Diagnosis not present

## 2015-10-18 DIAGNOSIS — Z79899 Other long term (current) drug therapy: Secondary | ICD-10-CM

## 2015-10-18 DIAGNOSIS — R040 Epistaxis: Secondary | ICD-10-CM | POA: Diagnosis present

## 2015-10-18 DIAGNOSIS — I10 Essential (primary) hypertension: Secondary | ICD-10-CM | POA: Diagnosis not present

## 2015-10-18 DIAGNOSIS — F329 Major depressive disorder, single episode, unspecified: Secondary | ICD-10-CM | POA: Diagnosis present

## 2015-10-18 DIAGNOSIS — Z885 Allergy status to narcotic agent status: Secondary | ICD-10-CM

## 2015-10-18 DIAGNOSIS — E119 Type 2 diabetes mellitus without complications: Secondary | ICD-10-CM

## 2015-10-18 DIAGNOSIS — Z992 Dependence on renal dialysis: Secondary | ICD-10-CM

## 2015-10-18 DIAGNOSIS — I2510 Atherosclerotic heart disease of native coronary artery without angina pectoris: Secondary | ICD-10-CM | POA: Diagnosis present

## 2015-10-18 DIAGNOSIS — I739 Peripheral vascular disease, unspecified: Secondary | ICD-10-CM | POA: Diagnosis present

## 2015-10-18 DIAGNOSIS — N179 Acute kidney failure, unspecified: Secondary | ICD-10-CM

## 2015-10-18 DIAGNOSIS — I130 Hypertensive heart and chronic kidney disease with heart failure and stage 1 through stage 4 chronic kidney disease, or unspecified chronic kidney disease: Secondary | ICD-10-CM | POA: Diagnosis present

## 2015-10-18 DIAGNOSIS — I480 Paroxysmal atrial fibrillation: Secondary | ICD-10-CM | POA: Diagnosis present

## 2015-10-18 DIAGNOSIS — R5381 Other malaise: Secondary | ICD-10-CM | POA: Diagnosis present

## 2015-10-18 DIAGNOSIS — I5033 Acute on chronic diastolic (congestive) heart failure: Secondary | ICD-10-CM | POA: Diagnosis present

## 2015-10-18 DIAGNOSIS — F32A Depression, unspecified: Secondary | ICD-10-CM | POA: Diagnosis present

## 2015-10-18 LAB — BASIC METABOLIC PANEL
Anion gap: 8 (ref 5–15)
BUN: 57 mg/dL — AB (ref 6–20)
CHLORIDE: 106 mmol/L (ref 101–111)
CO2: 25 mmol/L (ref 22–32)
CREATININE: 2.94 mg/dL — AB (ref 0.44–1.00)
Calcium: 8.6 mg/dL — ABNORMAL LOW (ref 8.9–10.3)
GFR calc Af Amer: 18 mL/min — ABNORMAL LOW (ref >60)
GFR calc non Af Amer: 15 mL/min — ABNORMAL LOW (ref >60)
GLUCOSE: 114 mg/dL — AB (ref 65–99)
POTASSIUM: 4.4 mmol/L (ref 3.5–5.1)
Sodium: 139 mmol/L (ref 135–145)

## 2015-10-18 LAB — CBC
HEMATOCRIT: 26.4 % — AB (ref 36.0–46.0)
Hemoglobin: 7.9 g/dL — ABNORMAL LOW (ref 12.0–15.0)
MCH: 26.4 pg (ref 26.0–34.0)
MCHC: 29.9 g/dL — AB (ref 30.0–36.0)
MCV: 88.3 fL (ref 78.0–100.0)
PLATELETS: 203 10*3/uL (ref 150–400)
RBC: 2.99 MIL/uL — ABNORMAL LOW (ref 3.87–5.11)
RDW: 15.8 % — AB (ref 11.5–15.5)
WBC: 7.6 10*3/uL (ref 4.0–10.5)

## 2015-10-18 LAB — PROTIME-INR
INR: 2.27 — AB (ref 0.00–1.49)
Prothrombin Time: 24.8 seconds — ABNORMAL HIGH (ref 11.6–15.2)

## 2015-10-18 LAB — GLUCOSE, CAPILLARY
Glucose-Capillary: 116 mg/dL — ABNORMAL HIGH (ref 65–99)
Glucose-Capillary: 157 mg/dL — ABNORMAL HIGH (ref 65–99)
Glucose-Capillary: 205 mg/dL — ABNORMAL HIGH (ref 65–99)

## 2015-10-18 LAB — HEPARIN LEVEL (UNFRACTIONATED): HEPARIN UNFRACTIONATED: 0.59 [IU]/mL (ref 0.30–0.70)

## 2015-10-18 MED ORDER — DIPHENHYDRAMINE HCL 12.5 MG/5ML PO ELIX
12.5000 mg | ORAL_SOLUTION | Freq: Four times a day (QID) | ORAL | Status: DC | PRN
Start: 2015-10-18 — End: 2015-10-25

## 2015-10-18 MED ORDER — GUAIFENESIN-DM 100-10 MG/5ML PO SYRP
5.0000 mL | ORAL_SOLUTION | Freq: Four times a day (QID) | ORAL | Status: DC | PRN
Start: 2015-10-18 — End: 2015-10-25

## 2015-10-18 MED ORDER — LORATADINE 10 MG PO TABS: 10.0000 mg | ORAL_TABLET | Freq: Every day | ORAL | Status: DC

## 2015-10-18 MED ORDER — WARFARIN - PHARMACIST DOSING INPATIENT
Freq: Every day | Status: DC
  Administered 2015-10-22: 21:00:00

## 2015-10-18 MED ORDER — PROCHLORPERAZINE 25 MG RE SUPP: 12.5000 mg | Freq: Four times a day (QID) | RECTAL | Status: DC | PRN

## 2015-10-18 MED ORDER — AMLODIPINE BESYLATE 10 MG PO TABS: 10.0000 mg | ORAL_TABLET | Freq: Every day | ORAL | Status: DC

## 2015-10-18 MED ORDER — AMLODIPINE BESYLATE 10 MG PO TABS
10.0000 mg | ORAL_TABLET | Freq: Every day | ORAL | Status: DC
  Administered 2015-10-19 – 2015-10-25 (×7): 10 mg via ORAL
  Filled 2015-10-18 (×7): qty 1

## 2015-10-18 MED ORDER — ATORVASTATIN CALCIUM 80 MG PO TABS
80.0000 mg | ORAL_TABLET | Freq: Every evening | ORAL | Status: DC
  Administered 2015-10-18 – 2015-10-24 (×7): 80 mg via ORAL
  Filled 2015-10-18 (×7): qty 1

## 2015-10-18 MED ORDER — AMIODARONE HCL 200 MG PO TABS
200.0000 mg | ORAL_TABLET | Freq: Every day | ORAL | Status: DC
  Administered 2015-10-19 – 2015-10-25 (×7): 200 mg via ORAL
  Filled 2015-10-18 (×7): qty 1

## 2015-10-18 MED ORDER — PROCHLORPERAZINE MALEATE 5 MG PO TABS: 5.0000 mg | ORAL_TABLET | Freq: Four times a day (QID) | ORAL | Status: DC | PRN

## 2015-10-18 MED ORDER — INSULIN ASPART 100 UNIT/ML ~~LOC~~ SOLN: 0.0000 [IU] | Freq: Every day | SUBCUTANEOUS | Status: DC

## 2015-10-18 MED ORDER — ALUM & MAG HYDROXIDE-SIMETH 200-200-20 MG/5ML PO SUSP: 30.0000 mL | ORAL | Status: DC | PRN

## 2015-10-18 MED ORDER — INSULIN NPH ISOPHANE & REGULAR (70-30) 100 UNIT/ML ~~LOC~~ SUSP: 20.0000 [IU] | Freq: Two times a day (BID) | SUBCUTANEOUS | Status: DC

## 2015-10-18 MED ORDER — HYDROCODONE-ACETAMINOPHEN 7.5-325 MG PO TABS
1.0000 | ORAL_TABLET | Freq: Four times a day (QID) | ORAL | Status: DC | PRN
  Administered 2015-10-19 – 2015-10-25 (×10): 1 via ORAL
  Filled 2015-10-18 (×11): qty 1

## 2015-10-18 MED ORDER — ISOSORB DINITRATE-HYDRALAZINE 20-37.5 MG PO TABS
1.0000 | ORAL_TABLET | Freq: Three times a day (TID) | ORAL | Status: DC
  Administered 2015-10-18 – 2015-10-20 (×5): 1 via ORAL
  Filled 2015-10-18 (×7): qty 1

## 2015-10-18 MED ORDER — FUROSEMIDE 10 MG/ML IJ SOLN: 40.0000 mg | Freq: Two times a day (BID) | INTRAMUSCULAR | Status: DC

## 2015-10-18 MED ORDER — CALCIUM ACETATE (PHOS BINDER) 667 MG PO CAPS
1334.0000 mg | ORAL_CAPSULE | Freq: Three times a day (TID) | ORAL | Status: DC
  Administered 2015-10-19 – 2015-10-25 (×19): 1334 mg via ORAL
  Filled 2015-10-18 (×19): qty 2

## 2015-10-18 MED ORDER — WARFARIN SODIUM 5 MG PO TABS
2.5000 mg | ORAL_TABLET | Freq: Once | ORAL | Status: AC
  Administered 2015-10-18: 2.5 mg via ORAL
  Filled 2015-10-18: qty 1

## 2015-10-18 MED ORDER — METOPROLOL TARTRATE 25 MG PO TABS
25.0000 mg | ORAL_TABLET | Freq: Two times a day (BID) | ORAL | Status: DC
  Administered 2015-10-18 – 2015-10-25 (×14): 25 mg via ORAL
  Filled 2015-10-18 (×14): qty 1

## 2015-10-18 MED ORDER — NITROGLYCERIN 0.4 MG SL SUBL: 0.4000 mg | SUBLINGUAL_TABLET | SUBLINGUAL | Status: DC | PRN

## 2015-10-18 MED ORDER — INSULIN ASPART 100 UNIT/ML ~~LOC~~ SOLN
0.0000 [IU] | Freq: Three times a day (TID) | SUBCUTANEOUS | Status: DC
  Administered 2015-10-19: 3 [IU] via SUBCUTANEOUS
  Administered 2015-10-19: 4 [IU] via SUBCUTANEOUS
  Administered 2015-10-20 – 2015-10-22 (×5): 3 [IU] via SUBCUTANEOUS
  Administered 2015-10-22: 4 [IU] via SUBCUTANEOUS
  Administered 2015-10-23 – 2015-10-24 (×2): 3 [IU] via SUBCUTANEOUS

## 2015-10-18 MED ORDER — ZOLPIDEM TARTRATE 5 MG PO TABS: 5.0000 mg | ORAL_TABLET | Freq: Every evening | ORAL | Status: DC | PRN

## 2015-10-18 MED ORDER — ASPIRIN 81 MG PO TBEC: 81.0000 mg | DELAYED_RELEASE_TABLET | Freq: Every day | ORAL | Status: DC

## 2015-10-18 MED ORDER — WARFARIN SODIUM 2.5 MG PO TABS: 2.5000 mg | ORAL_TABLET | Freq: Once | ORAL | Status: DC

## 2015-10-18 MED ORDER — BISACODYL 5 MG PO TBEC: 5.0000 mg | DELAYED_RELEASE_TABLET | Freq: Every day | ORAL | Status: DC | PRN

## 2015-10-18 MED ORDER — FUROSEMIDE 10 MG/ML IJ SOLN
40.0000 mg | Freq: Two times a day (BID) | INTRAMUSCULAR | Status: DC
Start: 2015-10-18 — End: 2015-10-18
  Administered 2015-10-18: 40 mg via INTRAVENOUS
  Filled 2015-10-18: qty 4

## 2015-10-18 MED ORDER — SERTRALINE HCL 100 MG PO TABS
100.0000 mg | ORAL_TABLET | Freq: Every day | ORAL | Status: DC
  Administered 2015-10-19 – 2015-10-25 (×7): 100 mg via ORAL
  Filled 2015-10-18 (×7): qty 2

## 2015-10-18 MED ORDER — LORATADINE 10 MG PO TABS
10.0000 mg | ORAL_TABLET | Freq: Every day | ORAL | Status: DC
  Administered 2015-10-19 – 2015-10-25 (×7): 10 mg via ORAL
  Filled 2015-10-18 (×7): qty 1

## 2015-10-18 MED ORDER — INSULIN ASPART PROT & ASPART (70-30 MIX) 100 UNIT/ML ~~LOC~~ SUSP
15.0000 [IU] | Freq: Two times a day (BID) | SUBCUTANEOUS | Status: DC
  Administered 2015-10-19 – 2015-10-25 (×13): 15 [IU] via SUBCUTANEOUS
  Filled 2015-10-18 (×2): qty 10

## 2015-10-18 MED ORDER — PROCHLORPERAZINE EDISYLATE 5 MG/ML IJ SOLN: 5.0000 mg | Freq: Four times a day (QID) | INTRAMUSCULAR | Status: DC | PRN

## 2015-10-18 MED ORDER — ASPIRIN EC 81 MG PO TBEC
81.0000 mg | DELAYED_RELEASE_TABLET | Freq: Every day | ORAL | Status: DC
  Administered 2015-10-19 – 2015-10-25 (×6): 81 mg via ORAL
  Filled 2015-10-18 (×7): qty 1

## 2015-10-18 MED ORDER — ISOSORB DINITRATE-HYDRALAZINE 20-37.5 MG PO TABS: 1.0000 | ORAL_TABLET | Freq: Three times a day (TID) | ORAL | Status: DC

## 2015-10-18 MED ORDER — CALCIUM ACETATE (PHOS BINDER) 667 MG PO CAPS: 1334.0000 mg | ORAL_CAPSULE | Freq: Three times a day (TID) | ORAL | Status: DC

## 2015-10-18 MED ORDER — AMIODARONE HCL 200 MG PO TABS: 200.0000 mg | ORAL_TABLET | Freq: Every day | ORAL | Status: DC

## 2015-10-18 MED ORDER — FUROSEMIDE 10 MG/ML IJ SOLN
40.0000 mg | Freq: Two times a day (BID) | INTRAMUSCULAR | Status: AC
  Administered 2015-10-18 – 2015-10-19 (×2): 40 mg via INTRAVENOUS
  Filled 2015-10-18 (×2): qty 4

## 2015-10-18 MED ORDER — ACETAMINOPHEN 325 MG PO TABS: 325.0000 mg | ORAL_TABLET | ORAL | Status: DC | PRN

## 2015-10-18 MED ORDER — TRAZODONE HCL 50 MG PO TABS: 25.0000 mg | ORAL_TABLET | Freq: Every evening | ORAL | Status: DC | PRN

## 2015-10-18 NOTE — Progress Notes (Signed)
Briana Diones, RN Rehab Admission Coordinator Signed Physical Medicine and Rehabilitation PMR Pre-admission 10/18/2015 2:44 PM  Related encounter: ED to Hosp-Admission (Current) from 09/30/2015 in [facility]    Expand All Collapse All   PMR Admission Coordinator Pre-Admission Assessment  Patient: UNKOWN [name] is an 70 y.o., female MRN: [id number] DOB: Oct 06, 1945 Height: 5\' 5"  (165.1 cm) Weight: 109.3 kg (240 lb 15.4 oz)  Insurance Information HMO: NO PPO: PCP: IPA: 80/20: OTHER:  PRIMARY: Medicare A/B Policy#: [id number] Subscriber: Kizzie Bane CM Name: Phone#: Fax#:  Pre-Cert#: Employer: Retired Benefits: Phone #: Name: Checked in [place]. Date: 10/25/00 Deduct: $1316 Out of Pocket Max: none Life Max: unlimited CIR: 100% SNF: 100 days Outpatient: 80% Co-Pay: 20% Home Health: 100% Co-Pay: none DME: 80% Co-Pay: 20% Providers: patient's choice  SECONDARY: Generic commercial Policy#: [id number] Subscriber: Kizzie Bane CM Name: Phone#: Fax#:  Pre-Cert#: Employer: Retired Benefits: Phone #: [phone] Name:  Eff. 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  right handed female with history of chronic renal insufficiency 1.6-2.3, diabetes mellitus, CAD status post CABG, diastolic congestive heart failure. Patient lives with spouse who has dementia. One level home 3 steps to entry. Independent with assistive device prior to admission. Presented 10/01/2015 to [facility] hospital with chest pain. In the ED found to have hypoxia, fever 101.6, WBC 15,500. EKG lateral ST depression with mild elevated troponin 0.12. BNP 894. Chest x-ray showed alveolar opacities most dense in the right base. Cultures were obtained. Patient transferred to [facility] for ongoing care. Placed on broad-spectrum antibiotics. Cardiology follow-up for chest pain. Echocardiogram with ejection fraction [id number] grade 3 diastolic dysfunction. Histology function was normal. Myocardial imaging VQ scan concern for mild peri-infarct ischemia involving the anterior wall and anterior septal wall. Evidence for infarcts involving the anterior and lateral walls. Patient maintained on aspirin as well as heparin therapy. A. fib with RVR status post DCCV 10/08/2013. Renal service is consulted 10/10/2015 for elevated creatinine from baseline 1.6-2.3-5.32. Renal ultrasound showed no hydronephrosis. Hemodialysis catheter inserted 10/11/2015 and underwent intermittent hemodialysis. Is now off hemodialysis with improving urine output. Was volume overloaded and treated with diuretics. Physical and occupational therapy evaluations completed with recommendations of physical medicine rehabilitation consult.   Past Medical History  Past Medical History  Diagnosis Date  . Diabetes mellitus   . Hypertension   . Hyperlipidemia   . Heart murmur   . Peripheral vascular disease ([place])   . Arthritis   . Depression with anxiety   . Carotid artery occlusion   . Chronic kidney disease   . Neuropathy ([place])   . CAD (coronary artery disease)   . Rheumatic fever   . Sleep  apnea   . PAD (peripheral artery disease) ([place])   . Adenomatous colon polyp 09/2007  . Atrial fibrillation with rapid ventricular response ([place]) 10/05/2015    Family History  family history includes Breast cancer in her mother; Diabetes in her father; Heart disease in her brother and father.  Prior Rehab/Hospitalizations: No previous rehab  Has the patient had major  surgery during 100 days prior to admission? No  Current Medications   Current facility-administered medications:  . 0.9 % sodium chloride infusion, , Intravenous, Continuous, Cherene Altes, MD, Last Rate: 10 mL/hr at 10/07/15 2214 . 0.9 % sodium chloride infusion, 100 mL, Intravenous, PRN, Rexene Agent, MD . 0.9 % sodium chloride infusion, 100 mL, Intravenous, PRN, Rexene Agent, MD . 0.9 % sodium chloride infusion, , Intravenous, Once, Allie Bossier, MD . alteplase (CATHFLO ACTIVASE) injection 2 mg, 2 mg, Intracatheter, Once PRN, Rexene Agent, MD . amiodarone (PACERONE) tablet 200 mg, 200 mg, Oral, Daily, Florinda Marker, MD, 200 mg at 10/18/15 0858 . amLODipine (NORVASC) tablet 10 mg, 10 mg, Oral, Daily, Alexa R Burns, MD, 10 mg at 10/18/15 0858 . aspirin EC tablet 81 mg, 81 mg, Oral, Daily, Sueanne Margarita, MD, 81 mg at 10/18/15 0858 . atorvastatin (LIPITOR) tablet 80 mg, 80 mg, Oral, QPM, Luz Brazen, MD, 80 mg at 10/17/15 1658 . calcium acetate (PHOSLO) capsule 1,334 mg, 1,334 mg, Oral, TID WC, Cherene Altes, MD, 1,334 mg at 10/18/15 1200 . furosemide (LASIX) injection 40 mg, 40 mg, Intravenous, BID, Elmarie Shiley, MD, 40 mg at 10/18/15 1030 . heparin injection 1,000 Units, 1,000 Units, Dialysis, PRN, Rexene Agent, MD . heparin injection 1,000-6,000 Units, 1,000-6,000 Units, Intracatheter, PRN, Allie Bossier, MD, 3,000 Units at 10/11/15 1932 . HYDROcodone-acetaminophen (NORCO) 7.5-325 MG per tablet 1 tablet, 1 tablet, Oral, Q6H PRN, Kara Mead V, MD, 1 tablet at 10/17/15 2138 .  insulin aspart (novoLOG) injection 0-20 Units, 0-20 Units, Subcutaneous, TID WC, Cherene Altes, MD, 4 Units at 10/18/15 1228 . insulin aspart (novoLOG) injection 0-5 Units, 0-5 Units, Subcutaneous, QHS, Cherene Altes, MD, 2 Units at 10/12/15 2132 . insulin aspart protamine- aspart (NOVOLOG MIX 70/30) injection 15 Units, 15 Units, Subcutaneous, BID WC, Cherene Altes, MD, 15 Units at 10/18/15 0800 . isosorbide-hydrALAZINE (BIDIL) 20-37.5 MG per tablet 1 tablet, 1 tablet, Oral, TID, Cherene Altes, MD, 1 tablet at 10/18/15 [phone] . loratadine (CLARITIN) tablet 10 mg, 10 mg, Oral, Daily, Allie Bossier, MD, 10 mg at 10/18/15 1000 . metoprolol tartrate (LOPRESSOR) tablet 25 mg, 25 mg, Oral, BID, Alexa R Burns, MD, 25 mg at 10/18/15 0858 . nitroGLYCERIN (NITROSTAT) SL tablet 0.4 mg, 0.4 mg, Sublingual, Q5 min PRN, Brand Males, MD, 0.4 mg at 10/04/15 1009 . sertraline (ZOLOFT) tablet 100 mg, 100 mg, Oral, Daily, Kara Mead V, MD, 100 mg at 10/18/15 0857 . sodium chloride flush (NS) 0.9 % injection 10-40 mL, 10-40 mL, Intracatheter, Q12H, Rexene Agent, MD, 10 mL at 10/17/15 1000 . sodium chloride flush (NS) 0.9 % injection 10-40 mL, 10-40 mL, Intracatheter, PRN, Rexene Agent, MD, 10 mL at 10/16/15 2056 . warfarin (COUMADIN) tablet 2.5 mg, 2.5 mg, Oral, ONCE-1800, Reyne Dumas, MD . Warfarin - Pharmacist Dosing Inpatient, , Does not apply, q1800, Melburn Popper, RPH . zolpidem (AMBIEN) tablet 5 mg, 5 mg, Oral, QHS PRN, Chesley Mires, MD, 5 mg at 10/14/15 0136  Patients Current Diet: Diet Heart Room service appropriate?: Yes; Fluid consistency:: Thin Diet - low sodium heart healthy Diet - low sodium heart healthy  Precautions / Restrictions Precautions Precautions: Fall Precaution Comments: monitor HR an O2 sats Restrictions Weight Bearing Restrictions: No   Has the patient had 2 or more falls or a fall with injury in the past year?No  Prior Activity Level Community  (5-7x/wk): Went out daily, was driving  Home  Assistive Devices / Equipment Home Assistive Devices/Equipment: Eyeglasses, CBG Meter, Cane (specify quad or straight) Home Equipment: Bedside commode, Shower seat, Grab bars - tub/shower, Hand held shower head, Cane - single point, Cane - quad  Prior Device Use: Indicate devices/aids used by the patient prior to current illness, exacerbation or injury? Cane  Prior Functional Level Prior Function Level of Independence: Independent with assistive device(s) Comments: Used cane at times for extended distance  Self Care: Did the patient need help bathing, dressing, using the toilet or eating? Independent  Indoor Mobility: Did the patient need assistance with walking from room to room (with or without device)? Independent  Stairs: Did the patient need assistance with internal or external stairs (with or without device)? Independent  Functional Cognition: Did the patient need help planning regular tasks such as shopping or remembering to take medications? Independent  Current Functional Level Cognition  Overall Cognitive Status: Within Functional Limits for tasks assessed Orientation Level: Oriented X4   Extremity Assessment (includes Sensation/Coordination)  Upper Extremity Assessment: Overall WFL for tasks assessed  Lower Extremity Assessment: Generalized weakness    ADLs  Overall ADL's : Needs assistance/impaired Eating/Feeding: Independent, Sitting Grooming: Wash/dry hands, Wash/dry face, Min guard, Standing Upper Body Bathing: Set up, Sitting Lower Body Bathing: Set up, Supervison/ safety (min guard A sit<>stand) Upper Body Dressing : Set up, Sitting Lower Body Dressing: Maximal assistance, Sit to/from stand Lower Body Dressing Details (indicate cue type and reason): Pt unable to access bil. feet for LB ADLs  Toilet Transfer: Min guard, Ambulation, Regular Toilet, Comfort height toilet, Grab bars, RW Toileting- Clothing  Manipulation and Hygiene: Sit to/from stand, Supervision/safety Functional mobility during ADLs: Min guard, Rolling walker General ADL Comments: Pt is very motivated     Mobility  Overal bed mobility: Modified Independent General bed mobility comments: No cues or physical assist.     Transfers  Overall transfer level: Needs assistance Equipment used: Rolling walker (2 wheeled) Transfers: Sit to/from Stand Sit to Stand: Supervision Stand pivot transfers: Min guard General transfer comment: Supervision for safety    Ambulation / Gait / Stairs / Wheelchair Mobility  Ambulation/Gait Ambulation/Gait assistance: Min guard Ambulation Distance (Feet): 120 Feet Assistive device: Rolling walker (2 wheeled) Gait Pattern/deviations: Step-through pattern General Gait Details: SpO2 dropped to 86% on 1L O2 after ambualting ~70 ft. O2 bumped up to 2L O2 and w/ standing rest break and pursed lip breathing, pt able to achieve SpO2 of 91%. She required verbal cues for safety when turning using RW, instructed not to lift RW off floor. Gait velocity: decreased Gait velocity interpretation: Below normal speed for age/gender    Posture / Balance Balance Overall balance assessment: Needs assistance Sitting-balance support: Feet supported, No upper extremity supported Sitting balance-Leahy Scale: Good Standing balance support: No upper extremity supported, During functional activity Standing balance-Leahy Scale: Fair Standing balance comment: RW for dynamic activity    Special needs/care consideration BiPAP/CPAP Yes, has CPAP CPM No Continuous Drip IV No Dialysis No, however, did have intermittent HD which is now completed  Life Vest No Oxygen On 02 3L [place] on acute side hospital Special Bed No Trach Size No Wound Vac (area) No  Skin No  Bowel mgmt: Last BM 10/16/15 Bladder mgmt: Catheter is out now. Is voiding. Diabetic mgmt Yes, on both  oral medication and insulin at home    Previous Home Environment Living Arrangements: Spouse/significant other, Children Available Help at Discharge: Family, Available PRN/intermittently Type of Home: House Home Layout: One level Home  Access: Stairs to enter Entrance Stairs-Rails: Left Entrance Stairs-Number of Steps: 3 Bathroom Shower/Tub: Gaffer, Architectural technologist: Handicapped height Home Care Services: No  Discharge Living Setting Plans for Discharge Living Setting: Patient's home, House, Lives with (comment) (Lives with husband.) Type of Home at Discharge: House Discharge Home Layout: One level Discharge Home Access: Stairs to enter Entrance Stairs-Number of Steps: 3 deep steps at the front and 5 steps at the back entry Does the patient have any problems obtaining your medications?: No  Social/Family/Support Systems Patient Roles: Spouse, Parent (Has a husband and a daughter.) Contact Information: Hazle Mcnair - spouse [phone] Anticipated Caregiver: self, daughter Anticipated Caregiver's Contact Information: Levy Pupa - daughter [phone] Ability/Limitations of Caregiver: Husband has mild dementia and is [facility] Caregiver Availability: Intermittent Discharge Plan Discussed with Primary Caregiver: Yes Is Caregiver In Agreement with Plan?: Yes Does Caregiver/Family have Issues with Lodging/Transportation while Pt is in Rehab?: No  Goals/Additional Needs Patient/Family Goal for Rehab: PT/OT mod I goals Expected length of stay: 7-9 days Cultural Considerations: None Dietary Needs: Heart healthy, thin liquids Equipment Needs: TBD Pt/Family Agrees to Admission and willing to participate: Yes Program Orientation Provided & Reviewed with Pt/Caregiver Including Roles & Responsibilities: Yes  Decrease burden of Care through IP rehab admission: N/A  Possible need for SNF placement upon discharge: No planned  Patient Condition: This patient's medical  and functional status has changed since the consult dated 10/12/15 in which the Rehabilitation Physician determined and documented that the patient was potentially appropriate for intensive rehabilitative care in an inpatient rehabilitation facility. Issues have been addressed and update has been discussed with Dr. Naaman Plummer and patient now appropriate for inpatient rehabilitation. Will admit to inpatient rehab today.   Preadmission Screen Completed By: Briana [name], 10/18/2015 2:59 PM ______________________________________________________________________  Discussed status with Dr. Posey Pronto and Dr. Naaman Plummer on 10/18/15 at 1400 and received telephone approval for admission today.  Admission Coordinator: Briana [name], time1459/Date05/24/17          Cosigned by: Ankit Lorie Phenix, MD at 10/18/2015 3:00 PM  Revision History     Date/Time User Provider Type Action   10/18/2015 3:00 PM Ankit Lorie Phenix, MD Physician Cosign   10/18/2015 2:59 PM Briana Diones, RN Rehab Admission Coordinator Sign

## 2015-10-18 NOTE — PMR Pre-admission (Signed)
PMR Admission Coordinator Pre-Admission Assessment  Patient: Briana [name] is an 70 y.o., female MRN: [id number] DOB: 1945-10-26 Height: 5\' 5"  (165.1 cm) Weight: 109.3 kg (240 lb 15.4 oz)              Insurance Information HMO:  NO    PPO:       PCP:       IPA:       80/20:       OTHER:   PRIMARY: Medicare A/B      Policy#: [id number]      Subscriber: Kizzie Bane CM Name:        Phone#:       Fax#:   Pre-Cert#:        Employer: Retired Benefits:  Phone #:       Name: Checked in [place]. Date: 10/25/00     Deduct: $1316      Out of Pocket Max: none      Life Max: unlimited CIR: 100%      SNF: 100 days Outpatient: 80%     Co-Pay: 20% Home Health: 100%      Co-Pay: none DME: 80%     Co-Pay: 20% Providers: patient's choice  SECONDARY: Generic commercial      Policy#: [id number]       Subscriber: Kizzie Bane CM Name:        Phone#:       Fax#:   Pre-Cert#:        Employer: Retired Benefits:  Phone #: [phone]     Name:   Eff. 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 right handed female with history of chronic renal insufficiency 1.6-2.3, diabetes mellitus, CAD status post CABG, diastolic congestive heart failure. Patient lives with spouse who has dementia. One level home 3 steps to entry. Independent with assistive device prior to admission. Presented 10/01/2015 to [facility] with chest pain. In the ED found to have hypoxia, fever 101.6, WBC 15,500. EKG lateral ST depression with mild  elevated troponin 0.12. BNP 894. Chest x-ray showed alveolar opacities most dense in the right base. Cultures were obtained. Patient transferred to [facility] for ongoing care. Placed on broad-spectrum antibiotics. Cardiology follow-up for chest pain. Echocardiogram with ejection fraction 123456 grade 3 diastolic dysfunction. Histology function was normal. Myocardial imaging VQ scan concern for mild peri-infarct ischemia involving the anterior wall and anterior septal wall. Evidence for infarcts involving the anterior and lateral walls. Patient maintained on aspirin as well as heparin therapy. A. fib with RVR status post DCCV 10/08/2013. Renal service is consulted 10/10/2015 for elevated creatinine from baseline 1.6-2.3-5.32. Renal ultrasound showed no hydronephrosis. Hemodialysis catheter inserted 10/11/2015 and underwent intermittent hemodialysis.  Is now off hemodialysis with improving urine output.  Was volume  overloaded and treated with diuretics.  Physical and occupational therapy evaluations completed with recommendations of physical medicine rehabilitation consult.     Past Medical History  Past Medical History  Diagnosis Date  . Diabetes mellitus   . Hypertension   . Hyperlipidemia   . Heart murmur   . Peripheral vascular disease ([place])   . Arthritis   . Depression with anxiety   . Carotid artery occlusion   . Chronic kidney disease   . Neuropathy ([place])   . CAD (coronary artery disease)   . Rheumatic fever   . Sleep apnea   . PAD (peripheral artery disease) ([place])   . Adenomatous colon polyp 09/2007  . Atrial fibrillation with rapid ventricular response ([place]) 10/05/2015    Family History  family history includes Breast cancer in her mother; Diabetes in her father; Heart disease in her brother and father.  Prior Rehab/Hospitalizations: No previous rehab  Has the patient had major surgery during 100 days prior to admission? No  Current Medications   Current  facility-administered medications:  .  0.9 %  sodium chloride infusion, , Intravenous, Continuous, Cherene Altes, MD, Last Rate: 10 mL/hr at 10/07/15 2214 .  0.9 %  sodium chloride infusion, 100 mL, Intravenous, PRN, Rexene Agent, MD .  0.9 %  sodium chloride infusion, 100 mL, Intravenous, PRN, Rexene Agent, MD .  0.9 %  sodium chloride infusion, , Intravenous, Once, Allie Bossier, MD .  alteplase (CATHFLO ACTIVASE) injection 2 mg, 2 mg, Intracatheter, Once PRN, Rexene Agent, MD .  amiodarone (PACERONE) tablet 200 mg, 200 mg, Oral, Daily, Florinda Marker, MD, 200 mg at 10/18/15 0858 .  amLODipine (NORVASC) tablet 10 mg, 10 mg, Oral, Daily, Alexa R Burns, MD, 10 mg at 10/18/15 0858 .  aspirin EC tablet 81 mg, 81 mg, Oral, Daily, Sueanne Margarita, MD, 81 mg at 10/18/15 0858 .  atorvastatin (LIPITOR) tablet 80 mg, 80 mg, Oral, QPM, Luz Brazen, MD, 80 mg at 10/17/15 1658 .  calcium acetate (PHOSLO) capsule 1,334 mg, 1,334 mg, Oral, TID WC, Cherene Altes, MD, 1,334 mg at 10/18/15 1200 .  furosemide (LASIX) injection 40 mg, 40 mg, Intravenous, BID, Elmarie Shiley, MD, 40 mg at 10/18/15 1030 .  heparin injection 1,000 Units, 1,000 Units, Dialysis, PRN, Rexene Agent, MD .  heparin injection 1,000-6,000 Units, 1,000-6,000 Units, Intracatheter, PRN, Allie Bossier, MD, 3,000 Units at 10/11/15 1932 .  HYDROcodone-acetaminophen (NORCO) 7.5-325 MG per tablet 1 tablet, 1 tablet, Oral, Q6H PRN, Kara Mead V, MD, 1 tablet at 10/17/15 2138 .  insulin aspart (novoLOG) injection 0-20 Units, 0-20 Units, Subcutaneous, TID WC, Cherene Altes, MD, 4 Units at 10/18/15 1228 .  insulin aspart (novoLOG) injection 0-5 Units, 0-5 Units, Subcutaneous, QHS, Cherene Altes, MD, 2 Units at 10/12/15 2132 .  insulin aspart protamine- aspart (NOVOLOG MIX 70/30) injection 15 Units, 15 Units, Subcutaneous, BID WC, Cherene Altes, MD, 15 Units at 10/18/15 0800 .  isosorbide-hydrALAZINE (BIDIL) 20-37.5 MG per tablet  1 tablet, 1 tablet, Oral, TID, Cherene Altes, MD, 1 tablet at 10/18/15 [id number] .  loratadine (CLARITIN) tablet 10 mg, 10 mg, Oral, Daily, Allie Bossier, MD, 10 mg at 10/18/15 1000 .  metoprolol tartrate (LOPRESSOR) tablet 25 mg, 25 mg, Oral, BID, Alexa R Burns, MD, 25 mg at 10/18/15 0858 .  nitroGLYCERIN (NITROSTAT) SL tablet 0.4 mg, 0.4 mg, Sublingual, Q5 min PRN, Brand Males, MD, 0.4 mg at 10/04/15 1009 .  sertraline (ZOLOFT) tablet 100 mg, 100 mg, Oral, Daily, Kara Mead V, MD, 100 mg at 10/18/15 0857 .  sodium chloride flush (NS) 0.9 % injection 10-40 mL, 10-40 mL, Intracatheter, Q12H, Rexene Agent, MD, 10 mL at 10/17/15 1000 .  sodium chloride flush (NS) 0.9 % injection 10-40 mL, 10-40 mL, Intracatheter, PRN, Rexene Agent, MD, 10 mL at 10/16/15 2056 .  warfarin (COUMADIN) tablet 2.5 mg, 2.5 mg, Oral, ONCE-1800, Reyne Dumas, MD .  Warfarin - Pharmacist Dosing Inpatient, , Does not apply, q1800, Melburn Popper, RPH .  zolpidem (AMBIEN) tablet 5 mg, 5 mg, Oral, QHS PRN, Chesley Mires, MD, 5 mg at 10/14/15 0136  Patients Current Diet: Diet Heart Room service appropriate?: Yes; Fluid consistency:: Thin Diet - low sodium heart healthy Diet - low sodium heart healthy  Precautions / Restrictions Precautions Precautions: Fall Precaution Comments: monitor HR an O2 sats Restrictions Weight Bearing Restrictions: No   Has the patient had 2 or more falls or a fall with injury in the past year?No  Prior Activity Level Community (5-7x/wk): Martin Majestic out daily, was driving  Development worker, international aid / [place] Devices/Equipment: Eyeglasses, CBG Meter, Radio producer (specify quad or straight) Home Equipment: Bedside commode, Shower seat, Grab bars - tub/shower, Hand held shower head, Cane - single point, Cane - quad  Prior Device Use: Indicate devices/aids used by the patient prior to current illness, exacerbation or injury? Cane  Prior Functional Level Prior Function Level of  Independence: Independent with assistive device(s) Comments: Used cane at times for extended distance  Self Care: Did the patient need help bathing, dressing, using the toilet or eating?  Independent  Indoor Mobility: Did the patient need assistance with walking from room to room (with or without device)? Independent  Stairs: Did the patient need assistance with internal or external stairs (with or without device)? Independent  Functional Cognition: Did the patient need help planning regular tasks such as shopping or remembering to take medications? Independent  Current Functional Level Cognition  Overall Cognitive Status: Within Functional Limits for tasks assessed Orientation Level: Oriented X4    Extremity Assessment (includes Sensation/Coordination)  Upper Extremity Assessment: Overall WFL for tasks assessed  Lower Extremity Assessment: Generalized weakness    ADLs  Overall ADL's : Needs assistance/impaired Eating/Feeding: Independent, Sitting Grooming: Wash/dry hands, Wash/dry face, Min guard, Standing Upper Body Bathing: Set up, Sitting Lower Body Bathing: Set up, Supervison/ safety (min guard A sit<>stand) Upper Body Dressing : Set up, Sitting Lower Body Dressing: Maximal assistance, Sit to/from stand Lower Body Dressing Details (indicate cue type and reason): Pt unable to access bil. feet for LB ADLs  Toilet Transfer: Min guard, Ambulation, Regular Toilet, Comfort height toilet, Grab bars, RW Toileting- Clothing Manipulation and Hygiene: Sit to/from stand, Supervision/safety Functional mobility during ADLs: Min guard, Rolling walker General ADL Comments: Pt is very motivated     Mobility  Overal bed mobility: Modified Independent General bed mobility comments: No cues or physical assist.     Transfers  Overall transfer level: Needs assistance Equipment used: Rolling walker (2 wheeled) Transfers: Sit to/from Stand Sit to Stand: Supervision Stand pivot transfers:  Min guard General transfer comment: Supervision for safety    Ambulation / Gait / Stairs / Wheelchair Mobility  Ambulation/Gait Ambulation/Gait assistance: Min guard Ambulation Distance (Feet): 120 Feet Assistive device: Rolling walker (2 wheeled) Gait Pattern/deviations: Step-through pattern General Gait Details: SpO2 dropped to 86% on 1L O2 after ambualting ~70 ft.  O2 bumped up to 2L  O2 and w/ standing rest break and pursed lip breathing, pt able to achieve SpO2 of 91%.  She required verbal cues for safety when turning using RW, instructed not to lift RW off floor. Gait velocity: decreased Gait velocity interpretation: Below normal speed for age/gender    Posture / Balance Balance Overall balance assessment: Needs assistance Sitting-balance support: Feet supported, No upper extremity supported Sitting balance-Leahy Scale: Good Standing balance support: No upper extremity supported, During functional activity Standing balance-Leahy Scale: Fair Standing balance comment: RW for dynamic activity    Special needs/care consideration BiPAP/CPAP Yes, has CPAP CPM No Continuous Drip IV No Dialysis No, however, did have intermittent HD which is now completed      Life Vest No Oxygen On 02 3L [place] on acute side hospital Special Bed No Trach Size No Wound Vac (area) No     Skin No                               Bowel mgmt: Last BM 10/16/15 Bladder mgmt: Catheter is out now.  Is voiding. Diabetic mgmt Yes, on both oral medication and insulin at home    Previous Home Environment Living Arrangements: Spouse/significant other, Children Available Help at Discharge: Family, Available PRN/intermittently Type of Home: House Home Layout: One level Home Access: Stairs to enter Entrance Stairs-Rails: Left Entrance Stairs-Number of Steps: 3 Bathroom Shower/Tub: Gaffer, Architectural technologist: Handicapped height Home Care Services: No  Discharge Living Setting Plans for Discharge  Living Setting: Patient's home, House, Lives with (comment) (Lives with husband.) Type of Home at Discharge: House Discharge Home Layout: One level Discharge Home Access: Stairs to enter Entrance Stairs-Number of Steps: 3 deep steps at the front and 5 steps at the back entry Does the patient have any problems obtaining your medications?: No  Social/Family/Support Systems Patient Roles: Spouse, Parent (Has a husband and a daughter.) Contact Information: Vaya Gherardi - spouse [phone] Anticipated Caregiver: self, daughter Anticipated Caregiver's Contact Information: Levy Pupa - daughter [phone] Ability/Limitations of Caregiver: Husband has mild dementia and is [facility] Caregiver Availability: Intermittent Discharge Plan Discussed with Primary Caregiver: Yes Is Caregiver In Agreement with Plan?: Yes Does Caregiver/Family have Issues with Lodging/Transportation while Pt is in Rehab?: No  Goals/Additional Needs Patient/Family Goal for Rehab: PT/OT mod I goals Expected length of stay: 7-9 days Cultural Considerations: None Dietary Needs: Heart healthy, thin liquids Equipment Needs: TBD Pt/Family Agrees to Admission and willing to participate: Yes Program Orientation Provided & Reviewed with Pt/Caregiver Including Roles  & Responsibilities: Yes  Decrease burden of Care through IP rehab admission: N/A  Possible need for SNF placement upon discharge: No planned  Patient Condition: This patient's medical and functional status has changed since the consult dated 10/12/15 in which the Rehabilitation Physician determined and documented that the patient was potentially appropriate for intensive rehabilitative care in an inpatient rehabilitation facility. Issues have been addressed and update has been discussed with Dr. Naaman Plummer and patient now appropriate for inpatient rehabilitation. Will admit to inpatient rehab today.   Preadmission Screen Completed By:  Retta Diones, 10/18/2015 2:59  PM ______________________________________________________________________   Discussed status with Dr. Posey Pronto and Dr. Naaman Plummer on 10/18/15 at 1400 and received telephone approval for admission today.  Admission Coordinator:  Retta Diones, time1459/Date05/24/17

## 2015-10-18 NOTE — Interval H&P Note (Signed)
Briana [name] was admitted today to Inpatient Rehabilitation with the diagnosis of debility.  The patient's history has been reviewed, patient examined, and there is no change in status.  Patient continues to be appropriate for intensive inpatient rehabilitation.  I have reviewed the patient's chart and labs.  Questions were answered to the patient's satisfaction. The PAPE has been reviewed and assessment remains appropriate.  Ankit Lorie Phenix 10/18/2015, 11:37 PM

## 2015-10-18 NOTE — Discharge Summary (Signed)
Physician Discharge Summary  Briana [name] MRN: [id number] DOB/AGE: 06/27/1945 70 y.o.  PCP: Antionette Fairy, PA-C   Admit date: 09/30/2015 Discharge date: 10/18/2015  Discharge Diagnoses:     Active Problems:   Acute on chronic diastolic CHF (congestive heart failure) (HCC)   Acute respiratory failure (HCC)   Hypoxia   Elevated troponin   Chest pain   DVT (deep venous thrombosis) (HCC)   Hypoxemia   Acute respiratory failure with hypoxia (HCC)   Acute pulmonary edema (HCC)   Diastolic CHF, acute on chronic (HCC)   Acute blood loss anemia   Controlled diabetes mellitus type 2 with complications (HCC)   Atrial fibrillation with rapid ventricular response ([place])   CAP (community acquired pneumonia)   CAD in native artery   Pulmonary hypertension ([place])   Atrial fibrillation with RVR ([place])   Acute on chronic renal failure ([place])   Uncontrolled type 2 diabetes mellitus with complication (HCC)   Physical deconditioning   Pulmonary edema   Abnormal nuclear stress test   Acute renal failure with tubular necrosis (HCC)   Hypertensive emergency   Non-ST elevation myocardial infarction (NSTEMI) ([place])   AKI (acute kidney injury) ([place])    Follow-up recommendations Follow-up with PCP in 3-5 days , including all  additional recommended appointments as below Follow-up CBC, CMP in 3-5 days Patient needs outpatient follow up with cardiology for continued ischemia workup Patient would need to be followed by nephrology CIR for management of her diuretics and monitoring her renal function Daily INR checks 1 week then twice weekly     Current Discharge Medication List    START taking these medications   Details  amiodarone (PACERONE) 200 MG tablet Take 1 tablet (200 mg total) by mouth daily. Qty: 30 tablet, Refills: 1    amLODipine (NORVASC) 10 MG tablet Take 1 tablet (10 mg total) by mouth daily. Qty: 30 tablet, Refills: 0    calcium acetate (PHOSLO) 667 MG capsule Take 2  capsules (1,334 mg total) by mouth 3 (three) times daily with meals. Qty: 90 capsule, Refills: 1    furosemide (LASIX) 10 MG/ML injection Inject 4 mLs (40 mg total) into the vein 2 (two) times daily. Qty: 4 mL, Refills: 0    isosorbide-hydrALAZINE (BIDIL) 20-37.5 MG tablet Take 1 tablet by mouth 3 (three) times daily. Qty: 90 tablet, Refills: 1    loratadine (CLARITIN) 10 MG tablet Take 1 tablet (10 mg total) by mouth daily. Qty: 30 tablet, Refills: 1    warfarin (COUMADIN) 2.5 MG tablet Take 1 tablet (2.5 mg total) by mouth one time only at 6 PM. Qty: 30 tablet, Refills: 1      CONTINUE these medications which have CHANGED   Details  aspirin EC 81 MG EC tablet Take 1 tablet (81 mg total) by mouth daily. Qty: 30 tablet, Refills: 1    insulin NPH-regular Human (NOVOLIN 70/30) (70-30) 100 UNIT/ML injection Inject 20 Units into the skin 2 (two) times daily with a meal. Qty: 10 mL, Refills: 2      CONTINUE these medications which have NOT CHANGED   Details  atorvastatin (LIPITOR) 80 MG tablet Take 80 mg by mouth daily.     cetirizine (ZYRTEC) 10 MG tablet Take 10 mg by mouth at bedtime.    cholecalciferol (VITAMIN D) 1000 UNITS tablet Take 1,000 Units by mouth daily.     HYDROcodone-acetaminophen (NORCO) 7.5-325 MG tablet Take 1 tablet by mouth every 4 (four) hours as needed for moderate  pain (Must last 30 days.  Do not drive or operate machinery while taking this medicine.). Qty: 120 tablet, Refills: 0    metoprolol tartrate (LOPRESSOR) 25 MG tablet Take 1 tablet (25 mg total) by mouth 2 (two) times daily. Qty: 180 tablet, Refills: 3    potassium chloride SA (K-DUR,KLOR-CON) 20 MEQ tablet Take 20 mEq by mouth daily.     sertraline (ZOLOFT) 100 MG tablet Take 200 mg by mouth at bedtime.     tiZANidine (ZANAFLEX) 4 MG capsule Take 4 mg by mouth 3 (three) times daily as needed for muscle spasms.     JANUVIA 25 MG tablet TAKE 1 TABLET EVERY DAY Qty: 30 tablet, Refills: 2       STOP taking these medications     diazepam (VALIUM) 5 MG tablet      furosemide (LASIX) 40 MG tablet      hydrALAZINE (APRESOLINE) 100 MG tablet          Discharge Condition: Stable    Discharge Instructions Get Medicines reviewed and adjusted: Please take all your medications with you for your next visit with your Primary MD  Please request your Primary MD to go over all hospital tests and procedure/radiological results at the follow up, please ask your Primary MD to get all Hospital records sent to his/her office.  If you experience worsening of your admission symptoms, develop shortness of breath, life threatening emergency, suicidal or homicidal thoughts you must seek medical attention immediately by calling 911 or calling your MD immediately if symptoms less severe.  You must read complete instructions/literature along with all the possible adverse reactions/side effects for all the Medicines you take and that have been prescribed to you. Take any new Medicines after you have completely understood and accpet all the possible adverse reactions/side effects.   Do not drive when taking Pain medications.   Do not take more than prescribed Pain, Sleep and Anxiety Medications  Special Instructions: If you have smoked or chewed Tobacco in the last 2 yrs please stop smoking, stop any regular Alcohol and or any Recreational drug use.  Wear Seat belts while driving.  Please note  You were cared for by a hospitalist during your hospital stay. Once you are discharged, your primary care physician will handle any further medical issues. Please note that NO REFILLS for any discharge medications will be authorized once you are discharged, as it is imperative that you return to your primary care physician (or establish a relationship with a primary care physician if you do not have one) for your aftercare needs so that they can reassess your need for medications and monitor your lab  values.  Discharge Instructions    Diet - low sodium heart healthy    Complete by:  As directed      Increase activity slowly    Complete by:  As directed             Allergies  Allergen Reactions  . Carbamazepine Other (See Comments)    Reaction to tegretol - loopy  . Clonazepam Other (See Comments)    Unknown reaction  . Gemfibrozil Other (See Comments)    Patient is not aware of this allergy but states that she has side effects to a cholesterol medication in the past  . Macrobid [Nitrofurantoin] Nausea And Vomiting  . Oxycodone Other (See Comments)    hallucinations      Disposition: CIR   Consults:   Cardiology   Nephrology  Pulmonary     Significant Diagnostic Studies:  US Carotid Duplex Bilateral  09/29/2015  CLINICAL DATA:  Bilateral carotid artery stenosis. EXAM: BILATERAL CAROTID DUPLEX ULTRASOUND TECHNIQUE: Pearline Cables scale imaging, color Doppler and duplex ultrasound were performed of bilateral carotid and vertebral arteries in the neck. COMPARISON:  11/24/2014. FINDINGS: Criteria: Quantification of carotid stenosis is based on velocity parameters that correlate the residual internal carotid diameter with NASCET-based stenosis levels, using the diameter of the distal internal carotid lumen as the denominator for stenosis measurement. The following velocity measurements were obtained: RIGHT ICA:  113/16 cm/sec CCA:  250/53 cm/sec SYSTOLIC ICA/CCA RATIO:  1.0 DIASTOLIC ICA/CCA RATIO:  1.5 ECA:  128 cm/sec LEFT ICA:  393/82 cm/sec CCA:  97/67 cm/sec SYSTOLIC ICA/CCA RATIO:  4.6 DIASTOLIC ICA/CCA RATIO:  5.8 ECA:  160 cm/sec RIGHT CAROTID ARTERY: Mild to moderate right carotid bifurcation atherosclerotic vascular disease. No flow limiting stenosis. Degree of stenosis less than 50%. RIGHT VERTEBRAL ARTERY:  Patent with antegrade flow. LEFT CAROTID ARTERY: Prominent left carotid bifurcation and proximal ICA atherosclerotic vascular plaque. Degree of stenosis greater than  70%. LEFT VERTEBRAL ARTERY:  Patent antegrade flow. IMPRESSION: 1. Greater than 70 degrees stenosis left carotid bifurcation and proximal ICA. Vascular surgery consultation suggested. 2. Atherosclerotic vascular plaque right carotid bifurcation. Degree of stenosis less than 50%. Vertebral arteries are patent antegrade flow. Electronically Signed   By: Marcello Moores  Register   On: 09/29/2015 13:15   Nm Myocar Multi W/spect W/wall Motion / Ef  10/10/2015  CLINICAL DATA:  70 year old with chest pain. EXAM: MYOCARDIAL IMAGING WITH SPECT (REST AND PHARMACOLOGIC-STRESS) GATED LEFT VENTRICULAR WALL MOTION STUDY LEFT VENTRICULAR EJECTION FRACTION TECHNIQUE: Standard myocardial SPECT imaging was performed after resting intravenous injection of 10 mCi Tc-39mtetrofosmin. Subsequently, intravenous infusion of Lexiscan was performed under the supervision of the Cardiology staff. At peak effect of the drug, 30 mCi Tc-936metrofosmin was injected intravenously and standard myocardial SPECT imaging was performed. Quantitative gated imaging was also performed to evaluate left ventricular wall motion, and estimate left ventricular ejection fraction. COMPARISON:  Chest radiograph 10/05/2015 FINDINGS: Perfusion: There are fixed defects involving the anterior, anteroseptal and lateral walls. These findings are compatible with areas of infarct. In addition, there is concern for mild reversibility in the mid segments of the anteroseptal wall and anterior wall. These areas are concerning for peri-infarct ischemia. Wall Motion: Dyskinesia at the apex. Left Ventricular Ejection Fraction: 45 % End diastolic volume 12341l End systolic volume 68 ml IMPRESSION: 1. Concern for mild peri-infarct ischemia involving the anterior wall and anteroseptal wall. Evidence for infarcts involving the anterior and lateral walls. 2. Dyskinesia at the apex. 3. Left ventricular ejection fraction is 45%. 4. Non invasive risk stratification*: Intermediate *2012  Appropriate Use Criteria for Coronary Revascularization Focused Update: J Am Coll Cardiol. 209379;02(4):[email or web address] Electronically Signed   By: AdMarkus Daft.D.   On: 10/10/2015 17:08   [place]  10/05/2015  CLINICAL DATA:  Chronic renal failure with acute exacerbation EXAM: RENAL ULTRASOUND COMPARISON:  September 05, 2008 FINDINGS: Right Kidney: Length: 11.2 cm. Echogenicity within normal limits. Renal cortical thickness is low normal. No mass, perinephric fluid, or hydronephrosis visualized. No sonographically demonstrable calculus or ureterectasis. Left Kidney: Length: 11.4 cm. Echogenicity within normal limits. Renal cortical thickness is low normal. No mass, perinephric fluid, or hydronephrosis visualized. No sonographically demonstrable calculus or ureterectasis. Bladder: Urinary bladder is decompressed with a Foley catheter and cannot be assessed. IMPRESSION: Low normal renal cortical thickness, a finding  that may be seen with medical renal disease. No increase in renal echogenicity or evidence of obstructing focus on either side. Study otherwise unremarkable. Electronically Signed   By: Lowella Grip III M.D.   On: 10/05/2015 17:47   Dg Chest Port 1 View  10/11/2015  CLINICAL DATA:  Central line placement. EXAM: PORTABLE CHEST 1 VIEW COMPARISON:  10/05/2015. FINDINGS: 1819 hours. Right IJ central venous catheter has been placed, extending to the mid SVC level. The heart size and mediastinal contours are stable status post CABG. Pulmonary edema appears mildly improved. No pneumothorax or significant pleural effusion identified. IMPRESSION: Central line extends to the mid SVC level. No evidence of pneumothorax. Mild improvement in pulmonary edema. Electronically Signed   By: Richardean Sale M.D.   On: 10/11/2015 18:31   Dg Chest Port 1 View  10/05/2015  CLINICAL DATA:  Pulmonary edema, history of coronary artery disease, CHF, CABG. Acute  respiratory failure with hypoxia. EXAM: PORTABLE CHEST 1 VIEW COMPARISON:  Portable chest x-ray of Oct 04, 2015 FINDINGS: The lungs are well-expanded. The interstitial markings remain increased. The left hemidiaphragm is slightly better demonstrated today but increased retrocardiac density persists. The cardiac silhouette remains enlarged. The pulmonary vascularity is slightly less engorged. There are post CABG changes. IMPRESSION: CHF superimposed upon COPD. Pulmonary interstitial and mild alveolar edema, slightly improved. Left basilar atelectasis or pneumonia and small left pleural effusion. Electronically Signed   By: David  Martinique M.D.   On: 10/05/2015 07:25   Dg Chest Port 1 View  10/04/2015  CLINICAL DATA:  Hypoxia.  Dyspnea EXAM: PORTABLE CHEST 1 VIEW COMPARISON:  10/03/2015 FINDINGS: Airspace opacities in the central and basilar regions have worsened from the earlier study. Probable left pleural effusion. Moderate vascular and interstitial congestion. Moderate cardiomegaly. IMPRESSION: The findings likely represent congestive heart failure. Infectious infiltrates cannot be excluded. Electronically Signed   By: Andreas Newport M.D.   On: 10/04/2015 01:39   Dg Chest Port 1 View  10/03/2015  CLINICAL DATA:  Shortness of breath EXAM: PORTABLE CHEST 1 VIEW COMPARISON:  10/02/2015 FINDINGS: Cardiomegaly again noted. Status post CABG. Patchy airspace disease bilaterally especially lower lobes again noted. Probable small left pleural effusion with left basilar atelectasis or infiltrate. Central mild vascular congestion without convincing pulmonary edema. IMPRESSION: Patchy airspace disease bilaterally especially lower lobes again noted. Probable small left pleural effusion and left basilar atelectasis or infiltrate. Central mild vascular congestion without convincing pulmonary edema. Electronically Signed   By: Lahoma Crocker M.D.   On: 10/03/2015 08:37   Dg Chest Port 1 View  10/02/2015  CLINICAL DATA:  Acute  respiratory failure. EXAM: PORTABLE CHEST 1 VIEW COMPARISON:  10/01/2015 FINDINGS: Airspace opacities persist throughout the central and basilar regions of both lungs, without significant interval change. No pneumothorax. Unchanged cardiomegaly. IMPRESSION: No significant interval change in the bilateral airspace opacities. Electronically Signed   By: Andreas Newport M.D.   On: 10/02/2015 23:10   Dg Chest Port 1 View  10/01/2015  CLINICAL DATA:  Initial evaluation for acute chest pain. EXAM: PORTABLE CHEST 1 VIEW COMPARISON:  Prior radiograph from 09/30/2015. FINDINGS: Median sternotomy wires with underlying CABG markers again noted. Cardiomegaly stable. Atheromatous plaque within the aortic arch. Lungs mildly hypoinflated with elevation left hemidiaphragm. Interval worsening and patchy right perihilar and basilar opacity, worrisome for progressive pneumonia. Additional patchy opacity at the left lung base may reflect atelectasis or infiltrate. Mild vascular congestion without overt pulmonary edema. No definite pleural effusion, although the right costophrenic angle  is incompletely visualized. No pneumothorax. No acute osseus abnormality. IMPRESSION: 1. Interval worsening in patchy right perihilar and basilar opacity, worrisome for progressive pneumonia. 2. Elevation of the left hemidiaphragm with additional patchy left basilar patchy opacity, which may reflect atelectasis or additional infiltrate. 3. Stable cardiomegaly with mild pulmonary vascular congestion without overt pulmonary edema. Electronically Signed   By: Jeannine Boga M.D.   On: 10/01/2015 05:15   Dg Chest Port 1 View  09/30/2015  CLINICAL DATA:  Dyspnea, cough, fever. EXAM: PORTABLE CHEST 1 VIEW COMPARISON:  05/31/2015 FINDINGS: There is unchanged moderate cardiomegaly. There is patchy right base opacity which could represent pneumonia. No large effusion. IMPRESSION: Patchy right lung base opacity, potentially pneumonia although  hemorrhage or asymmetric edema could also produce this appearance. Electronically Signed   By: Andreas Newport M.D.   On: 09/30/2015 22:33   2-D echo LV EF: 55% - 60%  ------------------------------------------------------------------- Indications: Dyspnea 786.09.  ------------------------------------------------------------------- History: PMH: Coronary artery disease. Risk factors: Rheumatic fever. Hypertension. Diabetes mellitus.  ------------------------------------------------------------------- Study Conclusions  - Left ventricle: The cavity size was normal. There was mild focal  basal hypertrophy of the septum. Systolic function was normal.  The estimated ejection fraction was in the range of 55% to 60%.  Wall motion was normal; there were no regional wall motion  abnormalities. There was a reduced contribution of atrial  contraction to ventricular filling, due to increased ventricular  diastolic pressure or atrial contractile dysfunction. Doppler  parameters are consistent with a reversible restrictive pattern,  indicative of decreased left ventricular diastolic compliance  and/or increased left atrial pressure (grade 3 diastolic  dysfunction). Doppler parameters are consistent with high  ventricular filling pressure. - Aortic valve: Severe diffuse thickening and calcification,  consistent with sclerosis. There was mild regurgitation. Valve  area (VTI): 1.93 cm^2. Valve area (Vmax): 1.83 cm^2. Valve area  (Vmean): 1.97 cm^2. - Mitral valve: Severely calcified annulus. Mild diffuse  calcification, with moderate involvement of chords. There was  mild regurgitation. Valve area by continuity equation (using LVOT  flow): 1.87 cm^2. - Left atrium: The atrium was severely dilated. - Tricuspid valve: There was moderate regurgitation. - Pulmonary arteries: PA peak pressure: 69 mm Hg (S).  Impressions:  - The right ventricular systolic  pressure was increased consistent  with severe pulmonary hypertension.      Filed Weights   10/16/15 0541 10/17/15 0501 10/18/15 0339  Weight: 109.861 kg (242 lb 3.2 oz) 110.315 kg (243 lb 3.2 oz) 109.3 kg (240 lb 15.4 oz)     Microbiology: No results found for this or any previous visit (from the past 240 hour(s)).     Blood Culture    Component Value Date/Time   SDES URINE, CATHETERIZED 09/30/2015 2235   SPECREQUEST NONE 09/30/2015 2235   CULT  09/30/2015 2235    NO GROWTH 2 DAYS Performed at [facility] 10/03/2015 FINAL 09/30/2015 2235      Labs: Results for orders placed or performed during the hospital encounter of 09/30/15 (from the past 48 hour(s))  Glucose, capillary     Status: Abnormal   Collection Time: 10/16/15 11:42 AM  Result Value Ref Range   Glucose-Capillary 160 (H) 65 - 99 mg/dL   Comment 1 Notify RN   Glucose, capillary     Status: Abnormal   Collection Time: 10/16/15  5:02 PM  Result Value Ref Range   Glucose-Capillary 156 (H) 65 - 99 mg/dL   Comment 1 Notify RN  Glucose, capillary     Status: Abnormal   Collection Time: 10/16/15  8:46 PM  Result Value Ref Range   Glucose-Capillary 193 (H) 65 - 99 mg/dL   Comment 1 Notify RN    Comment 2 Document in Chart   Heparin level (unfractionated)     Status: None   Collection Time: 10/17/15  4:30 AM  Result Value Ref Range   Heparin Unfractionated 0.70 0.30 - 0.70 IU/mL    Comment:        IF HEPARIN RESULTS ARE BELOW EXPECTED VALUES, AND PATIENT DOSAGE HAS BEEN CONFIRMED, SUGGEST FOLLOW UP TESTING OF ANTITHROMBIN III LEVELS.   CBC     Status: Abnormal   Collection Time: 10/17/15  4:30 AM  Result Value Ref Range   WBC 9.2 4.0 - 10.5 K/uL   RBC 3.05 (L) 3.87 - 5.11 MIL/uL   Hemoglobin 8.1 (L) 12.0 - 15.0 g/dL   HCT 26.5 (L) 36.0 - 46.0 %   MCV 86.9 78.0 - 100.0 fL   MCH 26.6 26.0 - 34.0 pg   MCHC 30.6 30.0 - 36.0 g/dL   RDW 15.7 (H) 11.5 - 15.5 %   Platelets 209  150 - 400 K/uL  Basic metabolic panel     Status: Abnormal   Collection Time: 10/17/15  4:30 AM  Result Value Ref Range   Sodium 140 135 - 145 mmol/L   Potassium 4.6 3.5 - 5.1 mmol/L   Chloride 107 101 - 111 mmol/L   CO2 25 22 - 32 mmol/L   Glucose, Bld 136 (H) 65 - 99 mg/dL   BUN 58 (H) 6 - 20 mg/dL   Creatinine, Ser 3.11 (H) 0.44 - 1.00 mg/dL   Calcium 8.6 (L) 8.9 - 10.3 mg/dL   GFR calc non Af Amer 14 (L) >60 mL/min   GFR calc Af Amer 16 (L) >60 mL/min    Comment: (NOTE) The eGFR has been calculated using the CKD EPI equation. This calculation has not been validated in all clinical situations. eGFR's persistently <60 mL/min signify possible Chronic Kidney Disease.    Anion gap 8 5 - 15  Protime-INR     Status: Abnormal   Collection Time: 10/17/15  4:30 AM  Result Value Ref Range   Prothrombin Time 20.6 (H) 11.6 - 15.2 seconds   INR 1.77 (H) 0.00 - 1.49  Glucose, capillary     Status: Abnormal   Collection Time: 10/17/15  6:35 AM  Result Value Ref Range   Glucose-Capillary 135 (H) 65 - 99 mg/dL  Glucose, capillary     Status: Abnormal   Collection Time: 10/17/15 11:44 AM  Result Value Ref Range   Glucose-Capillary 223 (H) 65 - 99 mg/dL  Iron and TIBC     Status: None   Collection Time: 10/17/15  1:10 PM  Result Value Ref Range   Iron 34 28 - 170 ug/dL   TIBC 288 250 - 450 ug/dL   Saturation Ratios 12 10.4 - 31.8 %   UIBC 254 ug/dL  Ferritin     Status: None   Collection Time: 10/17/15  1:10 PM  Result Value Ref Range   Ferritin 171 11 - 307 ng/mL  Glucose, capillary     Status: Abnormal   Collection Time: 10/17/15  4:53 PM  Result Value Ref Range   Glucose-Capillary 141 (H) 65 - 99 mg/dL  Glucose, capillary     Status: Abnormal   Collection Time: 10/17/15  9:06 PM  Result Value Ref Range  Glucose-Capillary 186 (H) 65 - 99 mg/dL   Comment 1 Notify RN    Comment 2 Document in Chart   Heparin level (unfractionated)     Status: None   Collection Time: 10/18/15   4:25 AM  Result Value Ref Range   Heparin Unfractionated 0.59 0.30 - 0.70 IU/mL    Comment:        IF HEPARIN RESULTS ARE BELOW EXPECTED VALUES, AND PATIENT DOSAGE HAS BEEN CONFIRMED, SUGGEST FOLLOW UP TESTING OF ANTITHROMBIN III LEVELS.   CBC     Status: Abnormal   Collection Time: 10/18/15  4:25 AM  Result Value Ref Range   WBC 7.6 4.0 - 10.5 K/uL   RBC 2.99 (L) 3.87 - 5.11 MIL/uL   Hemoglobin 7.9 (L) 12.0 - 15.0 g/dL   HCT 26.4 (L) 36.0 - 46.0 %   MCV 88.3 78.0 - 100.0 fL   MCH 26.4 26.0 - 34.0 pg   MCHC 29.9 (L) 30.0 - 36.0 g/dL   RDW 15.8 (H) 11.5 - 15.5 %   Platelets 203 150 - 400 K/uL  Protime-INR     Status: Abnormal   Collection Time: 10/18/15  4:25 AM  Result Value Ref Range   Prothrombin Time 24.8 (H) 11.6 - 15.2 seconds   INR 2.27 (H) 0.00 - 9.53  Basic metabolic panel     Status: Abnormal   Collection Time: 10/18/15  4:25 AM  Result Value Ref Range   Sodium 139 135 - 145 mmol/L   Potassium 4.4 3.5 - 5.1 mmol/L   Chloride 106 101 - 111 mmol/L   CO2 25 22 - 32 mmol/L   Glucose, Bld 114 (H) 65 - 99 mg/dL   BUN 57 (H) 6 - 20 mg/dL   Creatinine, Ser 2.94 (H) 0.44 - 1.00 mg/dL   Calcium 8.6 (L) 8.9 - 10.3 mg/dL   GFR calc non Af Amer 15 (L) >60 mL/min   GFR calc Af Amer 18 (L) >60 mL/min    Comment: (NOTE) The eGFR has been calculated using the CKD EPI equation. This calculation has not been validated in all clinical situations. eGFR's persistently <60 mL/min signify possible Chronic Kidney Disease.    Anion gap 8 5 - 15  Glucose, capillary     Status: Abnormal   Collection Time: 10/18/15  5:59 AM  Result Value Ref Range   Glucose-Capillary 116 (H) 65 - 99 mg/dL     Lipid Panel     Component Value Date/Time   CHOL  04/30/2009 0323    142        ATP III CLASSIFICATION:  <200     mg/dL   Desirable  200-239  mg/dL   Borderline High  >=240    mg/dL   High          TRIG 151* 04/30/2009 0323   HDL 41 04/30/2009 0323   CHOLHDL 3.5 04/30/2009 0323    VLDL 30 04/30/2009 0323   LDLCALC  04/30/2009 0323    71        Total Cholesterol/HDL:CHD Risk Coronary Heart Disease Risk Table                     Men   Women  1/2 Average Risk   3.4   3.3  Average Risk       5.0   4.4  2 X Average Risk   9.6   7.1  3 X Average Risk  23.4   11.0  Use the calculated Patient Ratio above and the CHD Risk Table to determine the patient's CHD Risk.        ATP III CLASSIFICATION (LDL):  <100     mg/dL   Optimal  100-129  mg/dL   Near or Above                    Optimal  130-159  mg/dL   Borderline  160-189  mg/dL   High  >190     mg/dL   Very High     Lab Results  Component Value Date   HGBA1C 6.9* 09/15/2015   HGBA1C 6.7* 05/31/2015   HGBA1C 6.4* 03/20/2015     Lab Results  Component Value Date   LDLCALC  04/30/2009    71        Total Cholesterol/HDL:CHD Risk Coronary Heart Disease Risk Table                     Men   Women  1/2 Average Risk   3.4   3.3  Average Risk       5.0   4.4  2 X Average Risk   9.6   7.1  3 X Average Risk  23.4   11.0        Use the calculated Patient Ratio above and the CHD Risk Table to determine the patient's CHD Risk.        ATP III CLASSIFICATION (LDL):  <100     mg/dL   Optimal  100-129  mg/dL   Near or Above                    Optimal  130-159  mg/dL   Borderline  160-189  mg/dL   High  >190     mg/dL   Very High   CREATININE 2.94* 10/18/2015     HPI :  Ms. Muraski is an 70 y.o. WF PMHx Depression with AnxietyDM Type 2 with complication (neuropathy), CAD native artery, PVD, Chronic Diastolic CHF, Heart murmur, OSA, HTN, HLD,Adenomatous colon polyp, CKD, who presented to the ED at [facility] on the evening of 5/6 with 24 hours of chest pain. In ED found to have: hypoxia, a fever to 101.6, WBC of 15.5, EKG changes (lateral ST depression), mild troponin elevation (0.12). BNP was elevated to 894.0, previously was 391. She had a CXR which showed alveolar opacities, most dense in the Rt  base. Cultures were obtained, she was started on CAP coverage, and due to the hypoxia was transferred to [facility] for tx of pneumonia. Patient was treated for her pneumonia and completed an antibiotic course was hospitalized. She was also found to have A. fib status post cardioversion by cardiology. During her hospital stay, she has had progressive renal failure with significant fluid overload, requiring hemodialysis 1. Fortunately, her kidney function is starting to recover off of hemodialysis, and her diuretics have been restarted on 5/22.  HOSPITAL COURSE:  Acute hypoxic Respiratory Failure in setting of bilateral pulmonary infiltrates. PNA vs Pulmonary Edema  - completed course of antibiotics 5 days - currently on [place], wean oxygen as tolerated  - Continue diuresis per nephrology  CAP - Completed 5 day course antibiotics - Afebrile, no leukocytosis  CAD native artery / NSTEMI - patient with chest pain, positive troponin - cardiology following and for now recommending medical management given CKD to avoid cath which can be done as outpatient. Outpatient cardiology appointment will be set  up  Acute on chronic Diastolic CHF/Pulmonary Hypertension exacerbated primarily by atrial fibrillation with RVR - Daily weights, fluid management per renal   A-Fib RVR/RBBB(CHA2DS2 VASc score is 6+) - EKG shows A. fib RVR with RBBB - s/p DCCV 5/15  - heparin / Coumadin, INR 2.27 this morning   HTN emergency w/ associated pulmonary edema  - BP146/44, controlled, continue current management with Amlodipine, Bidil, Metoprolol,   AKI on Chronic kidney disease stage III (baseline creatinine 1.6-2.3) likely hemodynamically mediated with acute diastolic heart failure as well as A. fib with RVR requiring DCCV. Creatinine appears to be better  this morning indicating some improvement of renal function. With improved urine output in response to intravenous furosemide 40 mg IV twice a day-we'll continue this  dose for the next 24 hours to 48 hours and decide on conversion to oral dosing , nephrology will continue to follow outpatient - Patient with progressive renal failure requiring hemodialysis 1  Creatinine 2.9 for the day of discharge  Acute blood loss anemia vs anemia of critical illness - Hemoglobin stable around 8   Leukocytosis - Resolved   DM Type 2 controlled with complication - 1/30 hemoglobin A1c= 6.9  - NovoLog 70/30 15 units BID - Resistant SSI  Depression - Zoloft 100 mg daily   Deconditioning - Improving well, working more and more physical therapy, initially planned for her to go to CIR however per CIR notes patient is improving rapidly and may be able to go TO snf VS home with [facility]    Discharge Exam:    Blood pressure 156/56, pulse 54, temperature 98.8 F (37.1 C), temperature source Oral, resp. rate 18, height _0  (1.651 m), weight 109.3 kg (240 lb 15.4 oz), SpO2 94 %.  QMV:HQIONGE comfortably up in her recliner, talking to her daughter sitting across her CVS: Pulse regular rhythm, S1 and S2 normal Resp: Decreased breath sounds at bases otherwise clear Abd: Soft, obese, nontender Ext: 3+ to 4+ pitting edema      Signed: Loy Mccartt 10/18/2015, 10:33 AM        Time spent >45 mins

## 2015-10-18 NOTE — Progress Notes (Signed)
Physical Therapy Treatment Patient Details Name: Briana [name] MRN: [id number] DOB: 1945-06-01 Today's Date: 10/18/2015    History of Present Illness Pt adm with acute hypoxic Respiratory Failure in setting of bilateral pulmonary infiltrates. Favor acute Pulmonary Edema over PNA. Pt placed on bipap. PMH - DM, CAD, diastolic HF,     PT Comments    Briana [name] continues to require min guard assist and cues for safe management using RW when ambulating.  SpO2 dropped to 86% on 1L O2 and required standing rest break w/ pursed lip breathing and O2 bumped up to 2L O2 for SpO2 to reach 91%.  She fatigues quickly w/ light UE exercise.  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  She will need rehab before going home and resuming this responsibility.  Right now her daughter is assisting pt's husband while pt in the hospital.  Continue to feel CIR will be most beneficial follow up for rehab but if this is not an option, recommend SNF prior to returning home.   Follow Up Recommendations  CIR (SNF if CIR is not an option)     Equipment Recommendations  Rolling walker with 5" wheels    Recommendations for Other Services       Precautions / Restrictions Precautions Precautions: Fall Precaution Comments: monitor HR an O2 sats Restrictions Weight Bearing Restrictions: No    Mobility  Bed Mobility Overal bed mobility: Modified Independent             General bed mobility comments: No cues or physical assist.   Transfers Overall transfer level: Needs assistance Equipment used: Rolling walker (2 wheeled) Transfers: Sit to/from Stand Sit to Stand: Supervision         General transfer comment: Supervision for safety  Ambulation/Gait Ambulation/Gait assistance: Min guard Ambulation Distance (Feet): 120 Feet Assistive device: Rolling walker (2 wheeled) Gait Pattern/deviations: Step-through pattern   Gait velocity interpretation: Below  normal speed for age/gender General Gait Details: SpO2 dropped to 86% on 1L O2 after ambualting ~70 ft.  O2 bumped up to 2L O2 and w/ standing rest break and pursed lip breathing, pt able to achieve SpO2 of 91%.  She required verbal cues for safety when turning using RW, instructed not to lift RW off floor.   Stairs            Wheelchair Mobility    Modified Rankin (Stroke Patients Only)       Balance Overall balance assessment: Needs assistance Sitting-balance support: Feet supported;No upper extremity supported Sitting balance-Leahy Scale: Good     Standing balance support: No upper extremity supported;During functional activity Standing balance-Leahy Scale: Fair Standing balance comment: RW for dynamic activity                    Cognition Arousal/Alertness: Awake/alert Behavior During Therapy: WFL for tasks assessed/performed Overall Cognitive Status: Within Functional Limits for tasks assessed                      Exercises General Exercises - Upper Extremity Shoulder Flexion: AROM;Right;Left;10 reps;Seated General Exercises - Lower Extremity Ankle Circles/Pumps: AROM;Both;Seated;20 reps Long Arc Quad: Strengthening;Both;Seated;20 reps Other Exercises Other Exercises: Encourged pt to ambulate w/ nursing staff 3x/day    General Comments General comments (skin integrity, edema, etc.): HD catheter removed earlier this morning, RN staff reported pt needs to wait 30 minutes prior to activity.  Waited 46 minutes before seeing pt.  No  complaints during session.  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  She will need rehab before going home and resuming this responsibility.  Right now her daughter is assisting pt's husband while pt in the hospital.      Pertinent Vitals/Pain Pain Assessment: Faces Faces Pain Scale: Hurts little more Pain Location: chronic back pain Pain Descriptors / Indicators: Aching;Constant Pain  Intervention(s): Limited activity within patient's tolerance;Monitored during session;Repositioned    Home Living                      Prior Function            PT Goals (current goals can now be found in the care plan section) Acute Rehab PT Goals Patient Stated Goal: to get her strength back before going home PT Goal Formulation: With patient Time For Goal Achievement: 10/20/15 Potential to Achieve Goals: Good Progress towards PT goals: Progressing toward goals    Frequency  Min 3X/week    PT Plan Current plan remains appropriate    Co-evaluation             End of Session Equipment Utilized During Treatment: Gait belt;Oxygen Activity Tolerance: Patient tolerated treatment well;Treatment limited secondary to medical complications (Comment) (hypoxia) Patient left: in chair;with call bell/phone within reach;with chair alarm set;with family/visitor present     Time: [id number] PT Time Calculation (min) (ACUTE ONLY): 16 min  Charges:  $Gait Training: 8-22 mins                    G Codes:      Briana [name] PT, DPT  Pager: [phone] Phone: [phone] 10/18/2015, 12:13 PM

## 2015-10-18 NOTE — Progress Notes (Signed)
Meredith Staggers, MD Physician Addendum Physical Medicine and Rehabilitation Consult Note 10/12/2015 9:39 AM  Related encounter: ED to Hosp-Admission (Current) from 09/30/2015 in [place] [facility]    Expand All Collapse All        Physical Medicine and Rehabilitation Consult Reason for Consult: Debilitation related to acute hypoxic respiratory failure/multi medical Referring Physician: [facility]    HPI: Briana [name] is a 70 y.o. right handed female with history of chronic renal insufficiency 1.6-2.3, diabetes mellitus, CAD status post CABG, diastolic congestive heart failure. Patient lives with spouse who has dementia. One level home 3 steps to entry. Independent with assistive device prior to admission. Presented 10/01/2015 to [facility] hospital with chest pain. In the ED found to have hypoxia, fever 101.6, WBC 15,500. EKG lateral ST depression with mild elevated troponin 0.12. BNP 894. Chest x-ray showed alveolar opacities most dense in the right base. Cultures were obtained. Patient transferred to [facility] for ongoing care. Placed on broad-spectrum antibiotics. Cardiology follow-up for chest pain. Echocardiogram with ejection fraction [id number] grade 3 diastolic dysfunction. Histology function was normal. Myocardial imaging VQ scan concern for mild peri-infarct ischemia involving the anterior wall and anterior septal wall. Evidence for infarcts involving the anterior and lateral walls. Patient maintained on aspirin as well as heparin therapy. A. fib with RVR status post DCCV 10/08/2013. Renal service is consulted 10/10/2015 for elevated creatinine from baseline 1.6-2.3-5.32. Renal ultrasound showed no hydronephrosis. Hemodialysis catheter inserted 10/11/2015 and await plan for possible need for hemodialysis. Physical and occupational therapy evaluations completed with recommendations of physical medicine rehabilitation consult.   Review of Systems  Constitutional:  Positive for fever. Negative for chills.  HENT: Negative for hearing loss.  Eyes: Negative for blurred vision and double vision.  Cardiovascular: Positive for chest pain, palpitations and leg swelling.  Gastrointestinal: Positive for constipation. Negative for nausea and vomiting.  Genitourinary: Negative for dysuria and hematuria.  Musculoskeletal: Positive for myalgias and joint pain.  Skin: Negative for rash.  Neurological: Negative for seizures and headaches.  Psychiatric/Behavioral: Positive for depression.   Anxiety  All other systems reviewed and are negative.  Past Medical History  Diagnosis Date  . Diabetes mellitus   . Hypertension   . Hyperlipidemia   . Heart murmur   . Peripheral vascular disease ([place])   . Arthritis   . Depression with anxiety   . Carotid artery occlusion   . Chronic kidney disease   . Neuropathy ([place])   . CAD (coronary artery disease)   . Rheumatic fever   . Sleep apnea   . PAD (peripheral artery disease) ([place])   . Adenomatous colon polyp 09/2007  . Atrial fibrillation with rapid ventricular response ([place]) 10/05/2015   Past Surgical History  Procedure Laterality Date  . Coronary artery bypass graft  2005  . Carotid endarterectomy  10/04/2009    Right CEA  . Tubal ligation    . Polypectomy  02/25/2012    Procedure: POLYPECTOMY; Surgeon: Danie Binder, MD; Location: [facility]; Service: Endoscopy; Laterality: N/A;   Family History  Problem Relation Age of Onset  . Breast cancer Mother   . Heart disease Father   . Diabetes Father   . Heart disease Brother    Social History:  reports that she has never smoked. She has never used smokeless tobacco. She reports that she does not drink alcohol or use illicit drugs. Allergies:  Allergies  Allergen Reactions  . Carbamazepine Other (See Comments)  Reaction to tegretol - loopy  .  Clonazepam Other (See Comments)    Unknown reaction  . Gemfibrozil Other (See Comments)    Patient is not aware of this allergy but states that she has side effects to a cholesterol medication in the past  . Macrobid [Nitrofurantoin] Nausea And Vomiting  . Oxycodone Other (See Comments)    hallucinations   Medications Prior to Admission  Medication Sig Dispense Refill  . aspirin EC 325 MG tablet Take 325 mg by mouth daily.    Marland Kitchen atorvastatin (LIPITOR) 80 MG tablet Take 80 mg by mouth daily.     . cetirizine (ZYRTEC) 10 MG tablet Take 10 mg by mouth at bedtime.    . cholecalciferol (VITAMIN D) 1000 UNITS tablet Take 1,000 Units by mouth daily.     . diazepam (VALIUM) 5 MG tablet Take 5 mg by mouth See admin instructions. Take 1 tablet (5 mg) by mouth daily at bedtime, may also take one tablet during the day as needed for anxiety    . furosemide (LASIX) 40 MG tablet Take 40 mg by mouth daily.     . hydrALAZINE (APRESOLINE) 100 MG tablet Take 1 tablet (100 mg total) by mouth 3 (three) times daily. 90 tablet 3  . HYDROcodone-acetaminophen (NORCO) 7.5-325 MG tablet Take 1 tablet by mouth every 4 (four) hours as needed for moderate pain (Must last 30 days. Do not drive or operate machinery while taking this medicine.). 120 tablet 0  . insulin NPH-regular Human (NOVOLIN 70/30) (70-30) 100 UNIT/ML injection Inject 30 Units into the skin 2 (two) times daily with a meal. 10 mL 2  . JANUVIA 25 MG tablet Take 1 tablet (25 mg total) by mouth daily. 30 tablet 2  . metoprolol tartrate (LOPRESSOR) 25 MG tablet Take 1 tablet (25 mg total) by mouth 2 (two) times daily. 180 tablet 3  . potassium chloride SA (K-DUR,KLOR-CON) 20 MEQ tablet Take 20 mEq by mouth daily.     . sertraline (ZOLOFT) 100 MG tablet Take 200 mg by mouth at bedtime.     Marland Kitchen tiZANidine (ZANAFLEX) 4 MG capsule Take 4 mg by mouth 3 (three) times daily as  needed for muscle spasms.       Home: Home Living Family/patient expects to be discharged to:: Inpatient rehab Living Arrangements: Spouse/significant other Available Help at Discharge: Family, Available PRN/intermittently Type of Home: House Home Access: Stairs to enter [employer] of Steps: 3 Entrance Stairs-Rails: Left Home Layout: One level Bathroom Shower/Tub: Gaffer, Theatre stage manager Toilet: Handicapped height Home Equipment: Bedside commode, Shower seat, Grab bars - tub/shower, Hand held shower head, Cane - single point, [employer] - quad  Functional History: Prior Function Level of Independence: Independent with assistive device(s) Comments: Used cane at times for extended distance Functional Status:  Mobility: Bed Mobility General bed mobility comments: Pt sitting in chair upon arrival Transfers Overall transfer level: Needs assistance Equipment used: Rolling walker (2 wheeled) Transfers: Sit to/from Stand, [employer] Transfers Sit to Stand: Supervision Stand pivot transfers: Min guard General transfer comment: pt stood 2x from recliner safely with supervision, appropriate use of arm rests and no physical assistance needed Ambulation/Gait Ambulation/Gait assistance: Min guard Ambulation Distance (Feet): 85 Feet (40', 45') Assistive device: Rolling walker (2 wheeled) Gait Pattern/deviations: Step-through pattern, Trunk flexed General Gait Details: cv's for erect posture and forward gaze. O2 sats stable 89-92% on 2L O2. HR maintained in 60's. Pt's legs began to tremble at end of each bout of ambulation  Gait velocity: decreased Gait velocity interpretation: <1.8 ft/sec, indicative of risk for recurrent falls    ADL: ADL Overall ADL's : Needs assistance/impaired Eating/Feeding: Independent, Sitting Grooming: Set up, Sitting Upper Body Bathing: Set up, Sitting Lower Body Bathing: Set up, Supervison/ safety (min guard A sit<>stand) Upper Body  Dressing : Set up, Sitting Lower Body Dressing: Maximal assistance, Sit to/from stand Lower Body Dressing Details (indicate cue type and reason): Pt unable to access bil. feet for LB ADLs  Toilet Transfer: Min guard, RW, BSC, Stand-pivot Toileting- Water quality scientist and Hygiene: Min guard, Sit to/from stand Functional mobility during ADLs: Min guard, Rolling walker General ADL Comments: Pt is very motivated   Cognition: Cognition Overall Cognitive Status: Within Functional Limits for tasks assessed Orientation Level: Oriented X4 Cognition Arousal/Alertness: Awake/alert Behavior During Therapy: WFL for tasks assessed/performed Overall Cognitive Status: Within Functional Limits for tasks assessed  Blood pressure 170/64, pulse 54, temperature 97.8 F (36.6 C), temperature source Oral, resp. rate 25, height 5\' 5"  (1.651 m), weight 111.5 kg (245 lb 13 oz), SpO2 96 %. Physical Exam  Constitutional: She is oriented to person, place, and time.  HENT:  Head: Normocephalic.  Eyes: EOM are normal.  Neck: Neck supple. No thyromegaly present.  Cardiovascular:  Cardiac rate controlled  Respiratory: Effort normal and breath sounds normal. No respiratory distress.  GI: Soft. Bowel sounds are normal. She exhibits no distension.  Musculoskeletal:  LUE 2+ edema BLE: 1+ edema  Neurological: She is alert and oriented to person, place, and time.  UE: 4/5 prox to distal LE: 2/5 HF, 3/5 KE and ADF/PF  Decreased LT/PP below mid calves to feet  Skin: Skin is warm and dry.  Psychiatric: She has a normal mood and affect. Her behavior is normal.     Lab Results Last 24 Hours    Results for orders placed or performed during the hospital encounter of 09/30/15 (from the past 24 hour(s))  Glucose, capillary Status: Abnormal   Collection Time: 10/11/15 11:48 AM  Result Value Ref Range   Glucose-Capillary 187 (H) 65 - 99 mg/dL   Comment 1 Capillary Specimen   Glucose,  capillary Status: Abnormal   Collection Time: 10/11/15 4:23 PM  Result Value Ref Range   Glucose-Capillary 142 (H) 65 - 99 mg/dL   Comment 1 Capillary Specimen   Glucose, capillary Status: None   Collection Time: 10/11/15 9:02 PM  Result Value Ref Range   Glucose-Capillary 74 65 - 99 mg/dL   Comment 1 Capillary Specimen   Heparin level (unfractionated) Status: None   Collection Time: 10/12/15 4:55 AM  Result Value Ref Range   Heparin Unfractionated 0.51 0.30 - 0.70 IU/mL  Renal function panel Status: Abnormal   Collection Time: 10/12/15 4:55 AM  Result Value Ref Range   Sodium 133 (L) 135 - 145 mmol/L   Potassium 4.6 3.5 - 5.1 mmol/L   Chloride 99 (L) 101 - 111 mmol/L   CO2 17 (L) 22 - 32 mmol/L   Glucose, Bld 97 65 - 99 mg/dL   BUN 118 (H) 6 - 20 mg/dL   Creatinine, Ser 7.31 (H) 0.44 - 1.00 mg/dL   Calcium 8.5 (L) 8.9 - 10.3 mg/dL   Phosphorus 9.0 (H) 2.5 - 4.6 mg/dL   Albumin 2.5 (L) 3.5 - 5.0 g/dL   GFR calc non Af Amer 5 (L) >60 mL/min   GFR calc Af Amer 6 (L) >60 mL/min   Anion gap 17 (H) 5 - 15  CBC Status: Abnormal  Collection Time: 10/12/15 4:55 AM  Result Value Ref Range   WBC 11.6 (H) 4.0 - 10.5 K/uL   RBC 3.09 (L) 3.87 - 5.11 MIL/uL   Hemoglobin 8.1 (L) 12.0 - 15.0 g/dL   HCT 26.1 (L) 36.0 - 46.0 %   MCV 84.5 78.0 - 100.0 fL   MCH 26.2 26.0 - 34.0 pg   MCHC 31.0 30.0 - 36.0 g/dL   RDW 15.6 (H) 11.5 - 15.5 %   Platelets 304 150 - 400 K/uL  Glucose, capillary Status: Abnormal   Collection Time: 10/12/15 7:53 AM  Result Value Ref Range   Glucose-Capillary 110 (H) 65 - 99 mg/dL   Comment 1 Capillary Specimen       Imaging Results (Last 48 hours)    Nm Myocar Multi W/spect W/wall Motion / Ef  10/10/2015 CLINICAL DATA: 70 year old with chest pain. EXAM: MYOCARDIAL IMAGING WITH  SPECT (REST AND PHARMACOLOGIC-STRESS) GATED LEFT VENTRICULAR WALL MOTION STUDY LEFT VENTRICULAR EJECTION FRACTION TECHNIQUE: Standard myocardial SPECT imaging was performed after resting intravenous injection of 10 mCi Tc-67m tetrofosmin. Subsequently, intravenous infusion of Lexiscan was performed under the supervision of the Cardiology staff. At peak effect of the drug, 30 mCi Tc-37m tetrofosmin was injected intravenously and standard myocardial SPECT imaging was performed. Quantitative gated imaging was also performed to evaluate left ventricular wall motion, and estimate left ventricular ejection fraction. COMPARISON: Chest radiograph 10/05/2015 FINDINGS: Perfusion: There are fixed defects involving the anterior, anteroseptal and lateral walls. These findings are compatible with areas of infarct. In addition, there is concern for mild reversibility in the mid segments of the anteroseptal wall and anterior wall. These areas are concerning for peri-infarct ischemia. Wall Motion: Dyskinesia at the apex. Left Ventricular Ejection Fraction: 45 % End diastolic volume [id number] ml End systolic volume 68 ml IMPRESSION: 1. Concern for mild peri-infarct ischemia involving the anterior wall and anteroseptal wall. Evidence for infarcts involving the anterior and lateral walls. 2. Dyskinesia at the apex. 3. Left ventricular ejection fraction is 45%. 4. Non invasive risk stratification*: Intermediate *2012 Appropriate Use Criteria for Coronary Revascularization Focused Update: J Am Coll Cardiol. [id number]. http://[email or web address] Electronically Signed By: Markus Daft M.D. On: 10/10/2015 17:08   Dg Chest Port 1 View  10/11/2015 CLINICAL DATA: Central line placement. EXAM: PORTABLE CHEST 1 VIEW COMPARISON: 10/05/2015. FINDINGS: 1819 hours. Right IJ central venous catheter has been placed, extending to the mid SVC level. The heart size and mediastinal contours are stable status  post CABG. Pulmonary edema appears mildly improved. No pneumothorax or significant pleural effusion identified. IMPRESSION: Central line extends to the mid SVC level. No evidence of pneumothorax. Mild improvement in pulmonary edema. Electronically Signed By: Richardean Sale M.D. On: 10/11/2015 18:31     Assessment/Plan: Diagnosis: debility related to MI/Afib/CHF and multiple medical 1. Does the need for close, 24 hr/day medical supervision in concert with the patient's rehab needs make it unreasonable for this patient to be served in a less intensive setting? Yes 2. Co-Morbidities requiring supervision/potential complications: CAD/renal failure/DM2 with peripheral neuropathy 3. Due to bladder management, bowel management, safety, skin/wound care, disease management, medication administration, pain management and patient education, does the patient require 24 hr/day rehab nursing? Yes 4. Does the patient require coordinated care of a physician, rehab nurse, PT (1-2 hrs/day, 5 days/week) and OT (1-2 hrs/day, 5 days/week) to address physical and functional deficits in the context of the above medical diagnosis(es)? Yes Addressing deficits in the following areas: balance, endurance, locomotion, strength, transferring, bowel/bladder control, bathing, dressing, feeding,  grooming and toileting 5. Can the patient actively participate in an intensive therapy program of at least 3 hrs of therapy per day at least 5 days per week? Potentially 6. The potential for patient to make measurable gains while on inpatient rehab is good 7. Anticipated functional outcomes upon discharge from inpatient rehab are modified independent with PT, modified independent with OT, n/a with SLP. 8. Estimated rehab length of stay to reach the above functional goals is: potentially 7-9 days 9. Does the patient have adequate social supports and living environment to accommodate these discharge functional goals? Yes and  Potentially 10. Anticipated D/C setting: Home 11. Anticipated post D/C treatments: HH therapy and Outpatient therapy 12. Overall Rehab/Functional Prognosis: excellent  RECOMMENDATIONS: This patient's condition is appropriate for continued rehabilitative care in the following setting: CIR Patient has agreed to participate in recommended program. Yes and Potentially Note that insurance prior authorization may be required for reimbursement for recommended care.  Comment: Will follow along as medical issues are addressed.   Meredith Staggers, MD, [facility] 10/12/2015     10/12/2015       Revision History     Date/Time User Provider Type Action   10/12/2015 10:25 AM Meredith Staggers, MD Physician Addend   10/12/2015 10:24 AM Meredith Staggers, MD Physician Sign   10/12/2015 10:03 AM Cathlyn Parsons, PA-C Physician Assistant [place]   View Details Report       Routing History     Date/Time From To Method   10/12/2015 10:25 AM Meredith Staggers, MD Antionette Fairy, PA-C Fax

## 2015-10-18 NOTE — Progress Notes (Signed)
ANTICOAGULATION CONSULT NOTE - Initial Consult  Pharmacy Consult for Coumadin Indication: atrial fibrillation  Allergies  Allergen Reactions  . Carbamazepine Other (See Comments)    Reaction to tegretol - loopy  . Clonazepam Other (See Comments)    Unknown reaction  . Gemfibrozil Other (See Comments)    Patient is not aware of this allergy but states that she has side effects to a cholesterol medication in the past  . Macrobid [Nitrofurantoin] Nausea And Vomiting  . Oxycodone Other (See Comments)    hallucinations    Patient Measurements: Height: 5\' 4"  (162.6 cm) IBW/kg (Calculated) : 54.7 Heparin Dosing Weight:   Vital Signs: Temp: 98.5 F (36.9 C) (05/24 1733) Temp Source: Oral (05/24 1733) BP: 175/54 mmHg (05/24 1747) Pulse Rate: 58 (05/24 1733)  Labs:  Recent Labs  10/16/15 0420 10/17/15 0430 10/18/15 0425  HGB 8.2* 8.1* 7.9*  HCT 27.0* 26.5* 26.4*  PLT 211 209 203  LABPROT  --  20.6* 24.8*  INR  --  1.77* 2.27*  HEPARINUNFRC 0.59 0.70 0.59  CREATININE 3.58* 3.11* 2.94*    Estimated Creatinine Clearance: 21.5 mL/min (by C-G formula based on Cr of 2.94).   Medical History: Past Medical History  Diagnosis Date  . Diabetes mellitus   . Hypertension   . Hyperlipidemia   . Heart murmur   . Peripheral vascular disease ([place])   . Arthritis   . Depression with anxiety   . Carotid artery occlusion   . Chronic kidney disease   . Neuropathy ([place])   . CAD (coronary artery disease)   . Rheumatic fever   . Sleep apnea   . PAD (peripheral artery disease) ([place])   . Adenomatous colon polyp 09/2007  . Atrial fibrillation with rapid ventricular response ([place]) 10/05/2015    Medications:  Scheduled:  . [START ON 10/19/2015] amiodarone  200 mg Oral Daily  . [START ON 10/19/2015] amLODipine  10 mg Oral Daily  . [START ON 10/19/2015] aspirin EC  81 mg Oral Daily  . atorvastatin  80 mg Oral QPM  . [START ON 10/19/2015] calcium acetate  1,334 mg Oral TID WC  .  furosemide  40 mg Intravenous BID  . [START ON 10/19/2015] insulin aspart  0-20 Units Subcutaneous TID WC  . insulin aspart  0-5 Units Subcutaneous QHS  . [START ON 10/19/2015] insulin aspart protamine- aspart  15 Units Subcutaneous BID WC  . isosorbide-hydrALAZINE  1 tablet Oral TID  . [START ON 10/19/2015] loratadine  10 mg Oral Daily  . metoprolol tartrate  25 mg Oral BID  . [START ON 10/19/2015] sertraline  100 mg Oral Daily  . warfarin  2.5 mg Oral ONCE-1800  . Warfarin - Pharmacist Dosing Inpatient   Does not apply q1800    Assessment: 70yo female on Coumadin prior to transfer to 53M, heparin bridge stopped today.  INR therapeutic and CBC stable.  She did not receive today's dose prior to transfer.  Goal of Therapy:  INR 2-3 Monitor platelets by anticoagulation protocol: Yes   Plan:  Coumadin 2.5mg  today Daily INR Watch for s/s of bleeding   Gracy Bruins, PharmD [facility]

## 2015-10-18 NOTE — H&P (View-Only) (Signed)
Physical Medicine and Rehabilitation Admission H&P    Chief Complaint  Patient presents with  . Debility     HPI:  Briana [name] is a 70 y.o. female with history of CAD s/p CABG, CAS, HTN with LVH, CKD, chronic diastolic heart failure who was admitted via [facility] with chest pain, fever, hypoxia and leucocytosis on 09/30/15. She was treated for CAP as well as acute on chronic CHF.   She found to have ST changes with positive cardiac enzymes due to NSTEMI and started on IV heparin.  She develop A fib with RVR with drip in H/H and was treated with amiodarone and Cardizem. She required DCCV by Dr. Oval Linsey on 05/15 with improvement in heart rate.  NM cardiac stress test showed anterior defect with concerns for peri infarct ischemia but patient asymptomatic and medical management recommended untill renal function improves.   Dr. Joelyn Oms was consulted due to worsening of renal failure. She was treated briefly with HD and diuretics resumed to help with UOP. She continues to require IV diuresis and HD cath removed today as creatinine is showing improvement.  To start IV iron to load depleted stores followed by epo to help with anemia of chronic disease?   Has been transitioned to coumadin and currently in NSR on amiodarone. She continues to have hypoxia with activity as well as decrease in endurance. Therapy ongoing and CIR recommended due to debility    Review of Systems  HENT: Negative for hearing loss.   Eyes: Negative for blurred vision and double vision.  Respiratory: Positive for shortness of breath (for 3-4 weeks PTA). Negative for cough.   Cardiovascular: Positive for leg swelling. Negative for chest pain.  Gastrointestinal: Negative for heartburn, nausea, vomiting and constipation.  Genitourinary: Negative for urgency and frequency.  Musculoskeletal: Positive for back pain and joint pain. Negative for falls.  Skin: Negative for itching and rash.  Neurological: Positive for tingling,  sensory change (neuropathy bilateral field ), focal weakness (LLE weaker --tends to give out) and weakness. Negative for dizziness and headaches.  Psychiatric/Behavioral: The patient is nervous/anxious. The patient does not have insomnia.   All other systems reviewed and are negative.     Past Medical History  Diagnosis Date  . Diabetes mellitus   . Hypertension   . Hyperlipidemia   . Heart murmur   . Peripheral vascular disease ([place])   . Arthritis   . Depression with anxiety   . Carotid artery occlusion   . Chronic kidney disease   . Neuropathy ([place])   . CAD (coronary artery disease)   . Rheumatic fever   . Sleep apnea   . PAD (peripheral artery disease) ([place])   . Adenomatous colon polyp 09/2007  . Atrial fibrillation with rapid ventricular response ([place]) 10/05/2015    Past Surgical History  Procedure Laterality Date  . Coronary artery bypass graft  2005  . Carotid endarterectomy  10/04/2009    Right CEA  . Tubal ligation    . Polypectomy  02/25/2012    Procedure: POLYPECTOMY;  Surgeon: Danie Binder, MD;  Location: [facility];  Service: Endoscopy;  Laterality: N/A;    Family History  Problem Relation Age of Onset  . Breast cancer Mother   . Heart disease Father   . Diabetes Father   . Heart disease Brother     Social History:  Married--retired from [employer] . Husband with dementia. She reports that she has never smoked. She has never used smokeless tobacco. She  reports that she does not drink alcohol or use illicit drugs.    Allergies  Allergen Reactions  . Carbamazepine Other (See Comments)    Reaction to tegretol - loopy  . Clonazepam Other (See Comments)    Unknown reaction  . Gemfibrozil Other (See Comments)    Patient is not aware of this allergy but states that she has side effects to a cholesterol medication in the past  . Macrobid [Nitrofurantoin] Nausea And Vomiting  . Oxycodone Other (See Comments)    hallucinations   Medications Prior to Admission    Medication Sig Dispense Refill  . aspirin EC 325 MG tablet Take 325 mg by mouth daily.    Marland Kitchen atorvastatin (LIPITOR) 80 MG tablet Take 80 mg by mouth daily.     . cetirizine (ZYRTEC) 10 MG tablet Take 10 mg by mouth at bedtime.    . cholecalciferol (VITAMIN D) 1000 UNITS tablet Take 1,000 Units by mouth daily.     . diazepam (VALIUM) 5 MG tablet Take 5 mg by mouth See admin instructions. Take 1 tablet (5 mg) by mouth daily at bedtime, may also take one tablet during the day as needed for anxiety    . furosemide (LASIX) 40 MG tablet Take 40 mg by mouth daily.     . hydrALAZINE (APRESOLINE) 100 MG tablet Take 1 tablet (100 mg total) by mouth 3 (three) times daily. 90 tablet 3  . HYDROcodone-acetaminophen (NORCO) 7.5-325 MG tablet Take 1 tablet by mouth every 4 (four) hours as needed for moderate pain (Must last 30 days.  Do not drive or operate machinery while taking this medicine.). 120 tablet 0  . metoprolol tartrate (LOPRESSOR) 25 MG tablet Take 1 tablet (25 mg total) by mouth 2 (two) times daily. 180 tablet 3  . potassium chloride SA (K-DUR,KLOR-CON) 20 MEQ tablet Take 20 mEq by mouth daily.     . sertraline (ZOLOFT) 100 MG tablet Take 200 mg by mouth at bedtime.     Marland Kitchen tiZANidine (ZANAFLEX) 4 MG capsule Take 4 mg by mouth 3 (three) times daily as needed for muscle spasms.     . [DISCONTINUED] insulin NPH-regular Human (NOVOLIN 70/30) (70-30) 100 UNIT/ML injection Inject 30 Units into the skin 2 (two) times daily with a meal. 10 mL 2    Home: Home Living Family/patient expects to be discharged to:: Inpatient rehab Living Arrangements: Spouse/significant other, Children Available Help at Discharge: Family, Available PRN/intermittently Type of Home: House Home Access: Stairs to enter Technical brewer of Steps: 3 Entrance Stairs-Rails: Left Home Layout: One level Bathroom Shower/Tub: Gaffer, Architectural technologist: Handicapped height Home Equipment: Bedside commode, Shower  seat, Grab bars - tub/shower, Hand held shower head, Cane - single point, [employer] - quad   Functional History: Prior Function Level of Independence: Independent with assistive device(s) Comments: Used cane at times for extended distance  Functional Status:  Mobility: Bed Mobility Overal bed mobility: Modified Independent General bed mobility comments: No cues or physical assist.  Transfers Overall transfer level: Needs assistance Equipment used: Rolling walker (2 wheeled) Transfers: Sit to/from Stand Sit to Stand: Supervision Stand pivot transfers: Min guard General transfer comment: Supervision for safety Ambulation/Gait Ambulation/Gait assistance: Min guard Ambulation Distance (Feet): 120 Feet Assistive device: Rolling walker (2 wheeled) Gait Pattern/deviations: Step-through pattern General Gait Details: SpO2 dropped to 86% on 1L O2 after ambualting ~70 ft.  O2 bumped up to 2L O2 and w/ standing rest break and pursed lip breathing, pt able to achieve SpO2  of 91%.  She required verbal cues for safety when turning using RW, instructed not to lift RW off floor. Gait velocity: decreased Gait velocity interpretation: Below normal speed for age/gender    ADL: ADL Overall ADL's : Needs assistance/impaired Eating/Feeding: Independent, Sitting Grooming: Wash/dry hands, Wash/dry face, Min guard, Standing Upper Body Bathing: Set up, Sitting Lower Body Bathing: Set up, Supervison/ safety (min guard A sit<>stand) Upper Body Dressing : Set up, Sitting Lower Body Dressing: Maximal assistance, Sit to/from stand Lower Body Dressing Details (indicate cue type and reason): Pt unable to access bil. feet for LB ADLs  Toilet Transfer: Min guard, Ambulation, Regular Toilet, Comfort height toilet, Grab bars, RW Toileting- Clothing Manipulation and Hygiene: Sit to/from stand, Supervision/safety Functional mobility during ADLs: Min guard, Rolling walker General ADL Comments: Pt is very motivated    Cognition: Cognition Overall Cognitive Status: Within Functional Limits for tasks assessed Orientation Level: Oriented X4 Cognition Arousal/Alertness: Awake/alert Behavior During Therapy: WFL for tasks assessed/performed Overall Cognitive Status: Within Functional Limits for tasks assessed   Blood pressure 146/49, pulse 54, temperature 98.3 F (36.8 C), temperature source Oral, resp. rate 18, height _0  (1.651 m), weight 109.3 kg (240 lb 15.4 oz), SpO2 94 %. Physical Exam  Nursing note and vitals reviewed. Constitutional: She is oriented to person, place, and time. She appears well-developed and well-nourished. No distress.  HENT:  Head: Normocephalic and atraumatic.  Mouth/Throat: Oropharynx is clear and moist.  Eyes: Conjunctivae and EOM are normal. Pupils are equal, round, and reactive to light. No scleral icterus.  Neck: Normal range of motion. Neck supple.  Cardiovascular: Normal rate.   Occasional extrasystoles are present.  Murmur heard. Respiratory: Effort normal. No stridor. No respiratory distress. She has decreased breath sounds in the right lower field and the left lower field. She has no wheezes.  GI: Soft. Bowel sounds are normal. She exhibits no distension. There is no tenderness.  Musculoskeletal: She exhibits edema. She exhibits no tenderness.  Neurological: She is alert and oriented to person, place, and time.  Motor: B/l UE: 4+/5 proximal to distal B/l LE: 4+/5 proximal to distal  Skin: Skin is warm and dry. She is not diaphoretic. No erythema.  Psychiatric: She has a normal mood and affect. Her behavior is normal. Judgment and thought content normal.    Results for orders placed or performed during the hospital encounter of 09/30/15 (from the past 48 hour(s))  Glucose, capillary     Status: Abnormal   Collection Time: 10/16/15  5:02 PM  Result Value Ref Range   Glucose-Capillary 156 (H) 65 - 99 mg/dL   Comment 1 Notify RN   Glucose, capillary      Status: Abnormal   Collection Time: 10/16/15  8:46 PM  Result Value Ref Range   Glucose-Capillary 193 (H) 65 - 99 mg/dL   Comment 1 Notify RN    Comment 2 Document in Chart   Heparin level (unfractionated)     Status: None   Collection Time: 10/17/15  4:30 AM  Result Value Ref Range   Heparin Unfractionated 0.70 0.30 - 0.70 IU/mL    Comment:        IF HEPARIN RESULTS ARE BELOW EXPECTED VALUES, AND PATIENT DOSAGE HAS BEEN CONFIRMED, SUGGEST FOLLOW UP TESTING OF ANTITHROMBIN III LEVELS.   CBC     Status: Abnormal   Collection Time: 10/17/15  4:30 AM  Result Value Ref Range   WBC 9.2 4.0 - 10.5 K/uL   RBC 3.05 (L) 3.87 -  5.11 MIL/uL   Hemoglobin 8.1 (L) 12.0 - 15.0 g/dL   HCT 26.5 (L) 36.0 - 46.0 %   MCV 86.9 78.0 - 100.0 fL   MCH 26.6 26.0 - 34.0 pg   MCHC 30.6 30.0 - 36.0 g/dL   RDW 15.7 (H) 11.5 - 15.5 %   Platelets 209 150 - 400 K/uL  Basic metabolic panel     Status: Abnormal   Collection Time: 10/17/15  4:30 AM  Result Value Ref Range   Sodium 140 135 - 145 mmol/L   Potassium 4.6 3.5 - 5.1 mmol/L   Chloride 107 101 - 111 mmol/L   CO2 25 22 - 32 mmol/L   Glucose, Bld 136 (H) 65 - 99 mg/dL   BUN 58 (H) 6 - 20 mg/dL   Creatinine, Ser 3.11 (H) 0.44 - 1.00 mg/dL   Calcium 8.6 (L) 8.9 - 10.3 mg/dL   GFR calc non Af Amer 14 (L) >60 mL/min   GFR calc Af Amer 16 (L) >60 mL/min    Comment: (NOTE) The eGFR has been calculated using the CKD EPI equation. This calculation has not been validated in all clinical situations. eGFR's persistently <60 mL/min signify possible Chronic Kidney Disease.    Anion gap 8 5 - 15  Protime-INR     Status: Abnormal   Collection Time: 10/17/15  4:30 AM  Result Value Ref Range   Prothrombin Time 20.6 (H) 11.6 - 15.2 seconds   INR 1.77 (H) 0.00 - 1.49  Glucose, capillary     Status: Abnormal   Collection Time: 10/17/15  6:35 AM  Result Value Ref Range   Glucose-Capillary 135 (H) 65 - 99 mg/dL  Glucose, capillary     Status: Abnormal    Collection Time: 10/17/15 11:44 AM  Result Value Ref Range   Glucose-Capillary 223 (H) 65 - 99 mg/dL  Iron and TIBC     Status: None   Collection Time: 10/17/15  1:10 PM  Result Value Ref Range   Iron 34 28 - 170 ug/dL   TIBC 288 250 - 450 ug/dL   Saturation Ratios 12 10.4 - 31.8 %   UIBC 254 ug/dL  Ferritin     Status: None   Collection Time: 10/17/15  1:10 PM  Result Value Ref Range   Ferritin 171 11 - 307 ng/mL  Glucose, capillary     Status: Abnormal   Collection Time: 10/17/15  4:53 PM  Result Value Ref Range   Glucose-Capillary 141 (H) 65 - 99 mg/dL  Glucose, capillary     Status: Abnormal   Collection Time: 10/17/15  9:06 PM  Result Value Ref Range   Glucose-Capillary 186 (H) 65 - 99 mg/dL   Comment 1 Notify RN    Comment 2 Document in Chart   Heparin level (unfractionated)     Status: None   Collection Time: 10/18/15  4:25 AM  Result Value Ref Range   Heparin Unfractionated 0.59 0.30 - 0.70 IU/mL    Comment:        IF HEPARIN RESULTS ARE BELOW EXPECTED VALUES, AND PATIENT DOSAGE HAS BEEN CONFIRMED, SUGGEST FOLLOW UP TESTING OF ANTITHROMBIN III LEVELS.   CBC     Status: Abnormal   Collection Time: 10/18/15  4:25 AM  Result Value Ref Range   WBC 7.6 4.0 - 10.5 K/uL   RBC 2.99 (L) 3.87 - 5.11 MIL/uL   Hemoglobin 7.9 (L) 12.0 - 15.0 g/dL   HCT 26.4 (L) 36.0 - 46.0 %  MCV 88.3 78.0 - 100.0 fL   MCH 26.4 26.0 - 34.0 pg   MCHC 29.9 (L) 30.0 - 36.0 g/dL   RDW 15.8 (H) 11.5 - 15.5 %   Platelets 203 150 - 400 K/uL  Protime-INR     Status: Abnormal   Collection Time: 10/18/15  4:25 AM  Result Value Ref Range   Prothrombin Time 24.8 (H) 11.6 - 15.2 seconds   INR 2.27 (H) 0.00 - 6.76  Basic metabolic panel     Status: Abnormal   Collection Time: 10/18/15  4:25 AM  Result Value Ref Range   Sodium 139 135 - 145 mmol/L   Potassium 4.4 3.5 - 5.1 mmol/L   Chloride 106 101 - 111 mmol/L   CO2 25 22 - 32 mmol/L   Glucose, Bld 114 (H) 65 - 99 mg/dL   BUN 57 (H) 6 - 20  mg/dL   Creatinine, Ser 2.94 (H) 0.44 - 1.00 mg/dL   Calcium 8.6 (L) 8.9 - 10.3 mg/dL   GFR calc non Af Amer 15 (L) >60 mL/min   GFR calc Af Amer 18 (L) >60 mL/min    Comment: (NOTE) The eGFR has been calculated using the CKD EPI equation. This calculation has not been validated in all clinical situations. eGFR's persistently <60 mL/min signify possible Chronic Kidney Disease.    Anion gap 8 5 - 15  Glucose, capillary     Status: Abnormal   Collection Time: 10/18/15  5:59 AM  Result Value Ref Range   Glucose-Capillary 116 (H) 65 - 99 mg/dL  Glucose, capillary     Status: Abnormal   Collection Time: 10/18/15 11:37 AM  Result Value Ref Range   Glucose-Capillary 157 (H) 65 - 99 mg/dL   No results found.   Medical Problem List and Plan: 1.  Abnormality of gait secondary to debility 2.  DVT Prophylaxis/Anticoagulation: Pharmaceutical: Coumadin 3. Pain Management: Hydrocodone prn for shoulder/wrist pain  4. Mood: Stable on Zoloft 100 mg daily. LCSW to follow for evaluation and support.  5. Neuropsych: This patient is capable of making decisions on her own behalf. 6. Skin/Wound Care: Routine care, avoid prolonged pressure and shearing. 7. Fluids/Electrolytes/Nutrition: Monitor I/O. Check lytes in am.  8. NSTEMI/Hx of CAD: Treated medically. On Metoprolol, lipitor, ASA and BIDIL 9. Afib with RVR: Monitor HR bid. Continue amiodarone, metoprolol and coumadin  10. DM type 2: Monitor BS ac/hs. Continue 70/30 insulin bid. Will use SSI for elevated BS.  11. Acute on chronic renal failure: Continue IV lasix bid per nephrology.  12.  HTN: Monitor BP bid--continue Norvasc, furosemide, lopressor and BIDIL.   Post Admission Physician Evaluation: 1. Functional deficits secondary  to debility. 2. Patient is admitted to receive collaborative, interdisciplinary care between the physiatrist, rehab nursing staff, and therapy team. 3. Patient's level of medical complexity and substantial therapy needs  in context of that medical necessity cannot be provided at a lesser intensity of care such as a SNF. 4. Patient has experienced substantial functional loss from his/her baseline which was documented above under the "Functional History" and "Functional Status" headings.  Judging by the patient's diagnosis, physical exam, and functional history, the patient has potential for functional progress which will result in measurable gains while on inpatient rehab.  These gains will be of substantial and practical use upon discharge  in facilitating mobility and self-care at the household level. 5. Physiatrist will provide 24 hour management of medical needs as well as oversight of the therapy plan/treatment and  provide guidance as appropriate regarding the interaction of the two. 6. 24 hour rehab nursing will assist with safety, disease management and patient education  and help integrate therapy concepts, techniques,education, etc. 7. PT will assess and treat for/with: Lower extremity strength, range of motion, stamina, balance, functional mobility, safety, adaptive techniques and equipment, coping skills, pain control, education.   Goals are: Mod I. 8. OT will assess and treat for/with: ADL's, functional mobility, safety, upper extremity strength, adaptive techniques and equipment, ego support, and community reintegration.   Goals are: Mod I. Therapy may proceed with showering this patient. 9. Case Management and Social Worker will assess and treat for psychological issues and discharge planning. 10. Team conference will be held weekly to assess progress toward goals and to determine barriers to discharge. 11. Patient will receive at least 3 hours of therapy per day at least 5 days per week. 12. ELOS: 5-8 days.       13. Prognosis:  good  Delice Lesch, MD 10/18/2015

## 2015-10-18 NOTE — Progress Notes (Signed)
Patient ID: Briana [name], female   DOB: 13-Jul-1945, 70 y.o.   MRN: [id number]  [facility] Progress Note   Assessment/ Plan:   1. AKI on Chronic kidney disease stage III (baseline creatinine 1.6-2.3) likely hemodynamically mediated with acute diastolic heart failure as well as A. fib with RVR requiring DCCV. Creatinine appears to be better on labs noted this morning indicating some improvement of renal function. With improved urine output in response to intravenous furosemide 40 mg IV twice a day-we'll continue this dose for the next 24 hours to 48 hours and decide on conversion to oral dosing thereafter. Discontinue hemodialysis catheter today. 2. Acute diastolic heart failure exacerbation: Appears to be exacerbated primarily by atrial fibrillation with RVR-currently appears improved (further helped by improving renal function/urine output).  3. Community acquired pneumonia: Status post completion of therapy with azithromycin and ceftriaxone-afebrile. 4. Atrial fibrillation status post DC CV: Currently appears to be in sinus rhythm, on amiodarone/warfarin 5. Anemia: With depressed iron stores-we'll give intravenous iron and then start ESA..  Subjective:   Reports to be feeling fair and had a better night with some improvement of shortness of breath.    Objective:   BP 156/56 mmHg  Pulse 54  Temp(Src) 98.8 F (37.1 C) (Oral)  Resp 18  Ht 5\' 5"  (1.651 m)  Wt 109.3 kg (240 lb 15.4 oz)  BMI 40.10 kg/m2  SpO2 94%  Intake/Output Summary (Last 24 hours) at 10/18/15 1021 Last data filed at 10/18/15 0731  Gross per 24 hour  Intake    564 ml  Output   3000 ml  Net  -2436 ml   Weight change: -1.015 kg (-2 lb 3.8 oz)  Physical Exam: [id number] comfortably up in her recliner, talking to her daughter sitting across her CVS: Pulse regular rhythm, S1 and S2 normal Resp: Decreased breath sounds at bases otherwise clear Abd: Soft, obese, nontender Ext: 3+ pitting  edema Right IJ temporary dialysis catheter  Imaging: No results found.  Labs: BMET  Recent Labs Lab 10/12/15 0455 10/13/15 0434 10/14/15 0315 10/15/15 0415 10/16/15 0420 10/17/15 0430 10/18/15 0425  NA 133* 135 137 137 140 140 139  K 4.6 3.8 3.8 4.0 4.3 4.6 4.4  CL 99* 99* 99* 102 107 107 106  CO2 17* 25 25 24 26 25 25   GLUCOSE 97 144* 96 129* 113* 136* 114*  BUN 118* 62* 68* 68* 59* 58* 57*  CREATININE 7.31* 4.54* 4.73* 4.31* 3.58* 3.11* 2.94*  CALCIUM 8.5* 8.1* 8.0* 8.3* 8.6* 8.6* 8.6*  PHOS 9.0* 5.3* 5.5* 4.3 3.9  --   --    CBC  Recent Labs Lab 10/15/15 0415 10/16/15 0420 10/17/15 0430 10/18/15 0425  WBC 9.3 10.3 9.2 7.6  HGB 8.1* 8.2* 8.1* 7.9*  HCT 26.1* 27.0* 26.5* 26.4*  MCV 83.9 87.9 86.9 88.3  PLT 213 211 209 203    Medications:    . sodium chloride   Intravenous Once  . amiodarone  200 mg Oral Daily  . amLODipine  10 mg Oral Daily  . aspirin EC  81 mg Oral Daily  . atorvastatin  80 mg Oral QPM  . calcium acetate  1,334 mg Oral TID WC  . insulin aspart  0-20 Units Subcutaneous TID WC  . insulin aspart  0-5 Units Subcutaneous QHS  . insulin aspart protamine- aspart  15 Units Subcutaneous BID WC  . isosorbide-hydrALAZINE  1 tablet Oral TID  . loratadine  10 mg Oral Daily  . metoprolol tartrate  25 mg Oral BID  . sertraline  100 mg Oral Daily  . sodium chloride flush  10-40 mL Intracatheter Q12H  . warfarin  2.5 mg Oral ONCE-1800  . Warfarin - Pharmacist Dosing Inpatient   Does not apply [id number]   Elmarie Shiley, MD 10/18/2015, 10:21 AM

## 2015-10-18 NOTE — H&P (Signed)
Physical Medicine and Rehabilitation Admission H&P    Chief Complaint  Patient presents with  . Debility     HPI:  Briana [name] is a 70 y.o. female with history of CAD s/p CABG, CAS, HTN with LVH, CKD, chronic diastolic heart failure who was admitted via [facility] with chest pain, fever, hypoxia and leucocytosis on 09/30/15. She was treated for CAP as well as acute on chronic CHF.   She found to have ST changes with positive cardiac enzymes due to NSTEMI and started on IV heparin.  She develop A fib with RVR with drip in H/H and was treated with amiodarone and Cardizem. She required DCCV by Dr. Oval Linsey on 05/15 with improvement in heart rate.  NM cardiac stress test showed anterior defect with concerns for peri infarct ischemia but patient asymptomatic and medical management recommended untill renal function improves.   Dr. Joelyn Oms was consulted due to worsening of renal failure. She was treated briefly with HD and diuretics resumed to help with UOP. She continues to require IV diuresis and HD cath removed today as creatinine is showing improvement.  To start IV iron to load depleted stores followed by epo to help with anemia of chronic disease?   Has been transitioned to coumadin and currently in NSR on amiodarone. She continues to have hypoxia with activity as well as decrease in endurance. Therapy ongoing and CIR recommended due to debility    Review of Systems  HENT: Negative for hearing loss.   Eyes: Negative for blurred vision and double vision.  Respiratory: Positive for shortness of breath (for 3-4 weeks PTA). Negative for cough.   Cardiovascular: Positive for leg swelling. Negative for chest pain.  Gastrointestinal: Negative for heartburn, nausea, vomiting and constipation.  Genitourinary: Negative for urgency and frequency.  Musculoskeletal: Positive for back pain and joint pain. Negative for falls.  Skin: Negative for itching and rash.  Neurological: Positive for tingling,  sensory change (neuropathy bilateral field ), focal weakness (LLE weaker --tends to give out) and weakness. Negative for dizziness and headaches.  Psychiatric/Behavioral: The patient is nervous/anxious. The patient does not have insomnia.   All other systems reviewed and are negative.     Past Medical History  Diagnosis Date  . Diabetes mellitus   . Hypertension   . Hyperlipidemia   . Heart murmur   . Peripheral vascular disease ([place])   . Arthritis   . Depression with anxiety   . Carotid artery occlusion   . Chronic kidney disease   . Neuropathy ([place])   . CAD (coronary artery disease)   . Rheumatic fever   . Sleep apnea   . PAD (peripheral artery disease) ([place])   . Adenomatous colon polyp 09/2007  . Atrial fibrillation with rapid ventricular response ([place]) 10/05/2015    Past Surgical History  Procedure Laterality Date  . Coronary artery bypass graft  2005  . Carotid endarterectomy  10/04/2009    Right CEA  . Tubal ligation    . Polypectomy  02/25/2012    Procedure: POLYPECTOMY;  Surgeon: Danie Binder, MD;  Location: [facility];  Service: Endoscopy;  Laterality: N/A;    Family History  Problem Relation Age of Onset  . Breast cancer Mother   . Heart disease Father   . Diabetes Father   . Heart disease Brother     Social History:  Married--retired from [employer] . Husband with dementia. She reports that she has never smoked. She has never used smokeless tobacco. She  reports that she does not drink alcohol or use illicit drugs.    Allergies  Allergen Reactions  . Carbamazepine Other (See Comments)    Reaction to tegretol - loopy  . Clonazepam Other (See Comments)    Unknown reaction  . Gemfibrozil Other (See Comments)    Patient is not aware of this allergy but states that she has side effects to a cholesterol medication in the past  . Macrobid [Nitrofurantoin] Nausea And Vomiting  . Oxycodone Other (See Comments)    hallucinations   Medications Prior to Admission    Medication Sig Dispense Refill  . aspirin EC 325 MG tablet Take 325 mg by mouth daily.    Marland Kitchen atorvastatin (LIPITOR) 80 MG tablet Take 80 mg by mouth daily.     . cetirizine (ZYRTEC) 10 MG tablet Take 10 mg by mouth at bedtime.    . cholecalciferol (VITAMIN D) 1000 UNITS tablet Take 1,000 Units by mouth daily.     . diazepam (VALIUM) 5 MG tablet Take 5 mg by mouth See admin instructions. Take 1 tablet (5 mg) by mouth daily at bedtime, may also take one tablet during the day as needed for anxiety    . furosemide (LASIX) 40 MG tablet Take 40 mg by mouth daily.     . hydrALAZINE (APRESOLINE) 100 MG tablet Take 1 tablet (100 mg total) by mouth 3 (three) times daily. 90 tablet 3  . HYDROcodone-acetaminophen (NORCO) 7.5-325 MG tablet Take 1 tablet by mouth every 4 (four) hours as needed for moderate pain (Must last 30 days.  Do not drive or operate machinery while taking this medicine.). 120 tablet 0  . metoprolol tartrate (LOPRESSOR) 25 MG tablet Take 1 tablet (25 mg total) by mouth 2 (two) times daily. 180 tablet 3  . potassium chloride SA (K-DUR,KLOR-CON) 20 MEQ tablet Take 20 mEq by mouth daily.     . sertraline (ZOLOFT) 100 MG tablet Take 200 mg by mouth at bedtime.     Marland Kitchen tiZANidine (ZANAFLEX) 4 MG capsule Take 4 mg by mouth 3 (three) times daily as needed for muscle spasms.     . [DISCONTINUED] insulin NPH-regular Human (NOVOLIN 70/30) (70-30) 100 UNIT/ML injection Inject 30 Units into the skin 2 (two) times daily with a meal. 10 mL 2    Home: Home Living Family/patient expects to be discharged to:: Inpatient rehab Living Arrangements: Spouse/significant other, Children Available Help at Discharge: Family, Available PRN/intermittently Type of Home: House Home Access: Stairs to enter Technical brewer of Steps: 3 Entrance Stairs-Rails: Left Home Layout: One level Bathroom Shower/Tub: Gaffer, Architectural technologist: Handicapped height Home Equipment: Bedside commode, Shower  seat, Grab bars - tub/shower, Hand held shower head, Cane - single point, [employer] - quad   Functional History: Prior Function Level of Independence: Independent with assistive device(s) Comments: Used cane at times for extended distance  Functional Status:  Mobility: Bed Mobility Overal bed mobility: Modified Independent General bed mobility comments: No cues or physical assist.  Transfers Overall transfer level: Needs assistance Equipment used: Rolling walker (2 wheeled) Transfers: Sit to/from Stand Sit to Stand: Supervision Stand pivot transfers: Min guard General transfer comment: Supervision for safety Ambulation/Gait Ambulation/Gait assistance: Min guard Ambulation Distance (Feet): 120 Feet Assistive device: Rolling walker (2 wheeled) Gait Pattern/deviations: Step-through pattern General Gait Details: SpO2 dropped to 86% on 1L O2 after ambualting ~70 ft.  O2 bumped up to 2L O2 and w/ standing rest break and pursed lip breathing, pt able to achieve SpO2  of 91%.  She required verbal cues for safety when turning using RW, instructed not to lift RW off floor. Gait velocity: decreased Gait velocity interpretation: Below normal speed for age/gender    ADL: ADL Overall ADL's : Needs assistance/impaired Eating/Feeding: Independent, Sitting Grooming: Wash/dry hands, Wash/dry face, Min guard, Standing Upper Body Bathing: Set up, Sitting Lower Body Bathing: Set up, Supervison/ safety (min guard A sit<>stand) Upper Body Dressing : Set up, Sitting Lower Body Dressing: Maximal assistance, Sit to/from stand Lower Body Dressing Details (indicate cue type and reason): Pt unable to access bil. feet for LB ADLs  Toilet Transfer: Min guard, Ambulation, Regular Toilet, Comfort height toilet, Grab bars, RW Toileting- Clothing Manipulation and Hygiene: Sit to/from stand, Supervision/safety Functional mobility during ADLs: Min guard, Rolling walker General ADL Comments: Pt is very motivated    Cognition: Cognition Overall Cognitive Status: Within Functional Limits for tasks assessed Orientation Level: Oriented X4 Cognition Arousal/Alertness: Awake/alert Behavior During Therapy: WFL for tasks assessed/performed Overall Cognitive Status: Within Functional Limits for tasks assessed   Blood pressure 146/49, pulse 54, temperature 98.3 F (36.8 C), temperature source Oral, resp. rate 18, height _0  (1.651 m), weight 109.3 kg (240 lb 15.4 oz), SpO2 94 %. Physical Exam  Nursing note and vitals reviewed. Constitutional: She is oriented to person, place, and time. She appears well-developed and well-nourished. No distress.  HENT:  Head: Normocephalic and atraumatic.  Mouth/Throat: Oropharynx is clear and moist.  Eyes: Conjunctivae and EOM are normal. Pupils are equal, round, and reactive to light. No scleral icterus.  Neck: Normal range of motion. Neck supple.  Cardiovascular: Normal rate.   Occasional extrasystoles are present.  Murmur heard. Respiratory: Effort normal. No stridor. No respiratory distress. She has decreased breath sounds in the right lower field and the left lower field. She has no wheezes.  GI: Soft. Bowel sounds are normal. She exhibits no distension. There is no tenderness.  Musculoskeletal: She exhibits edema. She exhibits no tenderness.  Neurological: She is alert and oriented to person, place, and time.  Motor: B/l UE: 4+/5 proximal to distal B/l LE: 4+/5 proximal to distal  Skin: Skin is warm and dry. She is not diaphoretic. No erythema.  Psychiatric: She has a normal mood and affect. Her behavior is normal. Judgment and thought content normal.    Results for orders placed or performed during the hospital encounter of 09/30/15 (from the past 48 hour(s))  Glucose, capillary     Status: Abnormal   Collection Time: 10/16/15  5:02 PM  Result Value Ref Range   Glucose-Capillary 156 (H) 65 - 99 mg/dL   Comment 1 Notify RN   Glucose, capillary      Status: Abnormal   Collection Time: 10/16/15  8:46 PM  Result Value Ref Range   Glucose-Capillary 193 (H) 65 - 99 mg/dL   Comment 1 Notify RN    Comment 2 Document in Chart   Heparin level (unfractionated)     Status: None   Collection Time: 10/17/15  4:30 AM  Result Value Ref Range   Heparin Unfractionated 0.70 0.30 - 0.70 IU/mL    Comment:        IF HEPARIN RESULTS ARE BELOW EXPECTED VALUES, AND PATIENT DOSAGE HAS BEEN CONFIRMED, SUGGEST FOLLOW UP TESTING OF ANTITHROMBIN III LEVELS.   CBC     Status: Abnormal   Collection Time: 10/17/15  4:30 AM  Result Value Ref Range   WBC 9.2 4.0 - 10.5 K/uL   RBC 3.05 (L) 3.87 -  5.11 MIL/uL   Hemoglobin 8.1 (L) 12.0 - 15.0 g/dL   HCT 26.5 (L) 36.0 - 46.0 %   MCV 86.9 78.0 - 100.0 fL   MCH 26.6 26.0 - 34.0 pg   MCHC 30.6 30.0 - 36.0 g/dL   RDW 15.7 (H) 11.5 - 15.5 %   Platelets 209 150 - 400 K/uL  Basic metabolic panel     Status: Abnormal   Collection Time: 10/17/15  4:30 AM  Result Value Ref Range   Sodium 140 135 - 145 mmol/L   Potassium 4.6 3.5 - 5.1 mmol/L   Chloride 107 101 - 111 mmol/L   CO2 25 22 - 32 mmol/L   Glucose, Bld 136 (H) 65 - 99 mg/dL   BUN 58 (H) 6 - 20 mg/dL   Creatinine, Ser 3.11 (H) 0.44 - 1.00 mg/dL   Calcium 8.6 (L) 8.9 - 10.3 mg/dL   GFR calc non Af Amer 14 (L) >60 mL/min   GFR calc Af Amer 16 (L) >60 mL/min    Comment: (NOTE) The eGFR has been calculated using the CKD EPI equation. This calculation has not been validated in all clinical situations. eGFR's persistently <60 mL/min signify possible Chronic Kidney Disease.    Anion gap 8 5 - 15  Protime-INR     Status: Abnormal   Collection Time: 10/17/15  4:30 AM  Result Value Ref Range   Prothrombin Time 20.6 (H) 11.6 - 15.2 seconds   INR 1.77 (H) 0.00 - 1.49  Glucose, capillary     Status: Abnormal   Collection Time: 10/17/15  6:35 AM  Result Value Ref Range   Glucose-Capillary 135 (H) 65 - 99 mg/dL  Glucose, capillary     Status: Abnormal    Collection Time: 10/17/15 11:44 AM  Result Value Ref Range   Glucose-Capillary 223 (H) 65 - 99 mg/dL  Iron and TIBC     Status: None   Collection Time: 10/17/15  1:10 PM  Result Value Ref Range   Iron 34 28 - 170 ug/dL   TIBC 288 250 - 450 ug/dL   Saturation Ratios 12 10.4 - 31.8 %   UIBC 254 ug/dL  Ferritin     Status: None   Collection Time: 10/17/15  1:10 PM  Result Value Ref Range   Ferritin 171 11 - 307 ng/mL  Glucose, capillary     Status: Abnormal   Collection Time: 10/17/15  4:53 PM  Result Value Ref Range   Glucose-Capillary 141 (H) 65 - 99 mg/dL  Glucose, capillary     Status: Abnormal   Collection Time: 10/17/15  9:06 PM  Result Value Ref Range   Glucose-Capillary 186 (H) 65 - 99 mg/dL   Comment 1 Notify RN    Comment 2 Document in Chart   Heparin level (unfractionated)     Status: None   Collection Time: 10/18/15  4:25 AM  Result Value Ref Range   Heparin Unfractionated 0.59 0.30 - 0.70 IU/mL    Comment:        IF HEPARIN RESULTS ARE BELOW EXPECTED VALUES, AND PATIENT DOSAGE HAS BEEN CONFIRMED, SUGGEST FOLLOW UP TESTING OF ANTITHROMBIN III LEVELS.   CBC     Status: Abnormal   Collection Time: 10/18/15  4:25 AM  Result Value Ref Range   WBC 7.6 4.0 - 10.5 K/uL   RBC 2.99 (L) 3.87 - 5.11 MIL/uL   Hemoglobin 7.9 (L) 12.0 - 15.0 g/dL   HCT 26.4 (L) 36.0 - 46.0 %  MCV 88.3 78.0 - 100.0 fL   MCH 26.4 26.0 - 34.0 pg   MCHC 29.9 (L) 30.0 - 36.0 g/dL   RDW 15.8 (H) 11.5 - 15.5 %   Platelets 203 150 - 400 K/uL  Protime-INR     Status: Abnormal   Collection Time: 10/18/15  4:25 AM  Result Value Ref Range   Prothrombin Time 24.8 (H) 11.6 - 15.2 seconds   INR 2.27 (H) 0.00 - 6.76  Basic metabolic panel     Status: Abnormal   Collection Time: 10/18/15  4:25 AM  Result Value Ref Range   Sodium 139 135 - 145 mmol/L   Potassium 4.4 3.5 - 5.1 mmol/L   Chloride 106 101 - 111 mmol/L   CO2 25 22 - 32 mmol/L   Glucose, Bld 114 (H) 65 - 99 mg/dL   BUN 57 (H) 6 - 20  mg/dL   Creatinine, Ser 2.94 (H) 0.44 - 1.00 mg/dL   Calcium 8.6 (L) 8.9 - 10.3 mg/dL   GFR calc non Af Amer 15 (L) >60 mL/min   GFR calc Af Amer 18 (L) >60 mL/min    Comment: (NOTE) The eGFR has been calculated using the CKD EPI equation. This calculation has not been validated in all clinical situations. eGFR's persistently <60 mL/min signify possible Chronic Kidney Disease.    Anion gap 8 5 - 15  Glucose, capillary     Status: Abnormal   Collection Time: 10/18/15  5:59 AM  Result Value Ref Range   Glucose-Capillary 116 (H) 65 - 99 mg/dL  Glucose, capillary     Status: Abnormal   Collection Time: 10/18/15 11:37 AM  Result Value Ref Range   Glucose-Capillary 157 (H) 65 - 99 mg/dL   No results found.   Medical Problem List and Plan: 1.  Abnormality of gait secondary to debility 2.  DVT Prophylaxis/Anticoagulation: Pharmaceutical: Coumadin 3. Pain Management: Hydrocodone prn for shoulder/wrist pain  4. Mood: Stable on Zoloft 100 mg daily. LCSW to follow for evaluation and support.  5. Neuropsych: This patient is capable of making decisions on her own behalf. 6. Skin/Wound Care: Routine care, avoid prolonged pressure and shearing. 7. Fluids/Electrolytes/Nutrition: Monitor I/O. Check lytes in am.  8. NSTEMI/Hx of CAD: Treated medically. On Metoprolol, lipitor, ASA and BIDIL 9. Afib with RVR: Monitor HR bid. Continue amiodarone, metoprolol and coumadin  10. DM type 2: Monitor BS ac/hs. Continue 70/30 insulin bid. Will use SSI for elevated BS.  11. Acute on chronic renal failure: Continue IV lasix bid per nephrology.  12.  HTN: Monitor BP bid--continue Norvasc, furosemide, lopressor and BIDIL.   Post Admission Physician Evaluation: 1. Functional deficits secondary  to debility. 2. Patient is admitted to receive collaborative, interdisciplinary care between the physiatrist, rehab nursing staff, and therapy team. 3. Patient's level of medical complexity and substantial therapy needs  in context of that medical necessity cannot be provided at a lesser intensity of care such as a SNF. 4. Patient has experienced substantial functional loss from his/her baseline which was documented above under the "Functional History" and "Functional Status" headings.  Judging by the patient's diagnosis, physical exam, and functional history, the patient has potential for functional progress which will result in measurable gains while on inpatient rehab.  These gains will be of substantial and practical use upon discharge  in facilitating mobility and self-care at the household level. 5. Physiatrist will provide 24 hour management of medical needs as well as oversight of the therapy plan/treatment and  provide guidance as appropriate regarding the interaction of the two. 6. 24 hour rehab nursing will assist with safety, disease management and patient education  and help integrate therapy concepts, techniques,education, etc. 7. PT will assess and treat for/with: Lower extremity strength, range of motion, stamina, balance, functional mobility, safety, adaptive techniques and equipment, coping skills, pain control, education.   Goals are: Mod I. 8. OT will assess and treat for/with: ADL's, functional mobility, safety, upper extremity strength, adaptive techniques and equipment, ego support, and community reintegration.   Goals are: Mod I. Therapy may proceed with showering this patient. 9. Case Management and Social Worker will assess and treat for psychological issues and discharge planning. 10. Team conference will be held weekly to assess progress toward goals and to determine barriers to discharge. 11. Patient will receive at least 3 hours of therapy per day at least 5 days per week. 12. ELOS: 5-8 days.       13. Prognosis:  good  Delice Lesch, MD 10/18/2015

## 2015-10-18 NOTE — Progress Notes (Signed)
CSW informed that pt will be admitted to CIR today  CSW signing off  Domenica Reamer, [place] Social Worker [phone]

## 2015-10-18 NOTE — Interval H&P Note (Deleted)
Briana [name] was admitted today to Inpatient Rehabilitation with the diagnosis of .  The patient's history has been reviewed, patient examined, and there is no change in status.  Patient continues to be appropriate for intensive inpatient rehabilitation.  I have reviewed the patient's chart and labs.  Questions were answered to the patient's satisfaction. The PAPE has been reviewed and assessment remains appropriate.  Pedro Whiters Lorie Phenix 10/18/2015, 11:31 PM

## 2015-10-18 NOTE — Progress Notes (Signed)
ANTICOAGULATION CONSULT NOTE - Follow Up Consult  Pharmacy Consult for Heparin to  Coumadin Indication: atrial fibrillation  Allergies  Allergen Reactions  . Carbamazepine Other (See Comments)    Reaction to tegretol - loopy  . Clonazepam Other (See Comments)    Unknown reaction  . Gemfibrozil Other (See Comments)    Patient is not aware of this allergy but states that she has side effects to a cholesterol medication in the past  . Macrobid [Nitrofurantoin] Nausea And Vomiting  . Oxycodone Other (See Comments)    hallucinations    Patient Measurements: Height: 5\' 5"  (165.1 cm) Weight: 240 lb 15.4 oz (109.3 kg) IBW/kg (Calculated) : 57 Heparin Dosing Weight: 80 kg  Vital Signs: Temp: 98.8 F (37.1 C) (05/24 0339) Temp Source: Oral (05/24 0339) BP: 156/56 mmHg (05/24 0339) Pulse Rate: 54 (05/24 0339)  Labs:  Recent Labs  10/16/15 0420 10/17/15 0430 10/18/15 0425  HGB 8.2* 8.1* 7.9*  HCT 27.0* 26.5* 26.4*  PLT 211 209 203  LABPROT  --  20.6* 24.8*  INR  --  1.77* 2.27*  HEPARINUNFRC 0.59 0.70 0.59  CREATININE 3.58* 3.11* 2.94*    Estimated Creatinine Clearance: 21.9 mL/min (by C-G formula based on Cr of 2.94).  Assessment: 70 y/o F for heparin bridge to coumadin for new onset AFib.  CBC stable  INR = therapeutic today -> 2.27, to stop heparin   Goal of Therapy:  Monitor platelets by anticoagulation protocol: Yes  INR goal = 2 to 3   Plan:  Coumadin 2.5 mg po x 1 today Stop heparin Follow up AM INR  Thank you Anette Guarneri, PharmD [phone] 10/18/2015,9:39 AM

## 2015-10-18 NOTE — Progress Notes (Signed)
Rehab admissions - I spoke with our medical director, Dr. Naaman Plummer, and he has given me approval to admit patient to inpatient rehab today.  Bed available and will admit to inpatient rehab today.  Call me for questions.  [phone]

## 2015-10-18 NOTE — Progress Notes (Signed)
Rehab admissions - I have asked PT to see patient this am.  Patient may be doing too well with mobility to need to come to inpatient rehab.  I will await PT progress.  Call me for questions.  [phone]

## 2015-10-19 ENCOUNTER — Inpatient Hospital Stay (HOSPITAL_COMMUNITY): Payer: Medicare Other | Admitting: Occupational Therapy

## 2015-10-19 ENCOUNTER — Inpatient Hospital Stay (HOSPITAL_COMMUNITY): Payer: Medicare Other | Admitting: Physical Therapy

## 2015-10-19 DIAGNOSIS — D62 Acute posthemorrhagic anemia: Secondary | ICD-10-CM

## 2015-10-19 DIAGNOSIS — E46 Unspecified protein-calorie malnutrition: Secondary | ICD-10-CM

## 2015-10-19 DIAGNOSIS — E8809 Other disorders of plasma-protein metabolism, not elsewhere classified: Secondary | ICD-10-CM | POA: Diagnosis present

## 2015-10-19 LAB — CBC WITH DIFFERENTIAL/PLATELET
BASOS ABS: 0.1 10*3/uL (ref 0.0–0.1)
Basophils Relative: 1 %
EOS PCT: 4 %
Eosinophils Absolute: 0.3 10*3/uL (ref 0.0–0.7)
HEMATOCRIT: 26.6 % — AB (ref 36.0–46.0)
Hemoglobin: 8 g/dL — ABNORMAL LOW (ref 12.0–15.0)
LYMPHS ABS: 1.5 10*3/uL (ref 0.7–4.0)
LYMPHS PCT: 20 %
MCH: 26.8 pg (ref 26.0–34.0)
MCHC: 30.1 g/dL (ref 30.0–36.0)
MCV: 89 fL (ref 78.0–100.0)
MONO ABS: 0.7 10*3/uL (ref 0.1–1.0)
MONOS PCT: 9 %
NEUTROS ABS: 5 10*3/uL (ref 1.7–7.7)
Neutrophils Relative %: 66 %
PLATELETS: 199 10*3/uL (ref 150–400)
RBC: 2.99 MIL/uL — ABNORMAL LOW (ref 3.87–5.11)
RDW: 15.7 % — AB (ref 11.5–15.5)
WBC: 7.6 10*3/uL (ref 4.0–10.5)

## 2015-10-19 LAB — GLUCOSE, CAPILLARY
GLUCOSE-CAPILLARY: 183 mg/dL — AB (ref 65–99)
GLUCOSE-CAPILLARY: 111 mg/dL — AB (ref 65–99)
Glucose-Capillary: 132 mg/dL — ABNORMAL HIGH (ref 65–99)

## 2015-10-19 LAB — COMPREHENSIVE METABOLIC PANEL
ALT: 18 U/L (ref 14–54)
ANION GAP: 8 (ref 5–15)
AST: 17 U/L (ref 15–41)
Albumin: 2.7 g/dL — ABNORMAL LOW (ref 3.5–5.0)
Alkaline Phosphatase: 65 U/L (ref 38–126)
BILIRUBIN TOTAL: 0.4 mg/dL (ref 0.3–1.2)
BUN: 54 mg/dL — AB (ref 6–20)
CHLORIDE: 105 mmol/L (ref 101–111)
CO2: 28 mmol/L (ref 22–32)
Calcium: 8.8 mg/dL — ABNORMAL LOW (ref 8.9–10.3)
Creatinine, Ser: 2.69 mg/dL — ABNORMAL HIGH (ref 0.44–1.00)
GFR, EST AFRICAN AMERICAN: 20 mL/min — AB (ref >60)
GFR, EST NON AFRICAN AMERICAN: 17 mL/min — AB (ref >60)
Glucose, Bld: 138 mg/dL — ABNORMAL HIGH (ref 65–99)
POTASSIUM: 4.6 mmol/L (ref 3.5–5.1)
Sodium: 141 mmol/L (ref 135–145)
TOTAL PROTEIN: 5.8 g/dL — AB (ref 6.5–8.1)

## 2015-10-19 LAB — PROTIME-INR
INR: 2.58 — AB (ref 0.00–1.49)
PROTHROMBIN TIME: 27.3 s — AB (ref 11.6–15.2)

## 2015-10-19 MED ORDER — BENEPROTEIN PO POWD
1.0000 | Freq: Three times a day (TID) | ORAL | Status: DC
  Administered 2015-10-19 – 2015-10-25 (×19): 6 g via ORAL
  Filled 2015-10-19: qty 227

## 2015-10-19 MED ORDER — SODIUM CHLORIDE 0.9 % IV SOLN
510.0000 mg | Freq: Once | INTRAVENOUS | Status: DC
  Filled 2015-10-19: qty 17

## 2015-10-19 MED ORDER — WARFARIN SODIUM 5 MG PO TABS
2.5000 mg | ORAL_TABLET | Freq: Once | ORAL | Status: AC
  Administered 2015-10-19: 2.5 mg via ORAL
  Filled 2015-10-19: qty 1

## 2015-10-19 MED ORDER — DARBEPOETIN ALFA 100 MCG/0.5ML IJ SOSY
100.0000 ug | PREFILLED_SYRINGE | INTRAMUSCULAR | Status: DC
  Administered 2015-10-19: 100 ug via SUBCUTANEOUS
  Filled 2015-10-19 (×2): qty 0.5

## 2015-10-19 MED ORDER — FUROSEMIDE 40 MG PO TABS
40.0000 mg | ORAL_TABLET | Freq: Every day | ORAL | Status: DC
  Administered 2015-10-20 – 2015-10-24 (×5): 40 mg via ORAL
  Filled 2015-10-19 (×6): qty 1

## 2015-10-19 NOTE — Progress Notes (Addendum)
[place] PHYSICAL MEDICINE & REHABILITATION     PROGRESS NOTE  Subjective/Complaints:  Pt sitting up in her recliner this AM.  She had a tough night because she states everyone kept coming in when she was trying to get up.  Educated pt on safety and allowance for independence as therapies progress.  ROS: Denies CP, SOB, N/V/D.  Objective: Vital Signs: Blood pressure 154/42, pulse 58, temperature 99 F (37.2 C), temperature source Oral, resp. rate 19, height 5\' 4"  (1.626 m), weight 108.71 kg (239 lb 10.6 oz), SpO2 93 %. No results found.  Recent Labs  10/18/15 0425 10/19/15 0444  WBC 7.6 7.6  HGB 7.9* 8.0*  HCT 26.4* 26.6*  PLT 203 199    Recent Labs  10/18/15 0425 10/19/15 0444  NA 139 141  K 4.4 4.6  CL 106 105  GLUCOSE 114* 138*  BUN 57* 54*  CREATININE 2.94* 2.69*  CALCIUM 8.6* 8.8*   CBG (last 3)   Recent Labs  10/18/15 1137 10/18/15 1606 10/19/15 0637  GLUCAP 157* 205* 132*    Wt Readings from Last 3 Encounters:  10/19/15 108.71 kg (239 lb 10.6 oz)  10/18/15 109.3 kg (240 lb 15.4 oz)  09/25/15 102.513 kg (226 lb)    Physical Exam:  BP 154/42 mmHg  Pulse 58  Temp(Src) 99 F (37.2 C) (Oral)  Resp 19  Ht 5\' 4"  (1.626 m)  Wt 108.71 kg (239 lb 10.6 oz)  BMI 41.12 kg/m2  SpO2 93% Constitutional: She appears well-developed and well-nourished. No distress.  HENT: Normocephalic and atraumatic.  Eyes: Conjunctivae and EOM are normal.   Cardiovascular: Normal rate. Occasional extrasystoles are present.  Murmur heard. Respiratory: Effort normal. No stridor. No respiratory distress. She has no wheezes.  GI: Soft. Bowel sounds are normal. She exhibits no distension. There is no tenderness.  Musculoskeletal: She exhibits mild edema. She exhibits no tenderness.  Neurological: She is alert and oriented.  Motor: B/l UE: 4+/5 proximal to distal B/l LE: 4+/5 proximal to distal  Skin: Neck wound healing. Skin is warm and dry. She is not diaphoretic. No  erythema.  Psychiatric: She has a normal mood and affect. Her behavior is normal. Judgment and thought content normal.   Assessment/Plan: 1. Functional deficits secondary to debility which require 3+ hours per day of interdisciplinary therapy in a comprehensive inpatient rehab setting. Physiatrist is providing close team supervision and 24 hour management of active medical problems listed below. Physiatrist and rehab team continue to assess barriers to discharge/monitor patient progress toward functional and medical goals.  Function:  Bathing Bathing position      Bathing parts      Bathing assist        Upper Body Dressing/Undressing Upper body dressing                    Upper body assist        Lower Body Dressing/Undressing Lower body dressing                                  Lower body assist        Toileting Toileting   Toileting steps completed by patient: Performs perineal hygiene Toileting steps completed by helper: Adjust clothing prior to toileting, Adjust clothing after toileting Toileting Assistive Devices: Grab bar or rail  Toileting assist Assist level: Touching or steadying assistance (Pt.75%)   Transfers Chair/bed transfer  Locomotion Ambulation           Wheelchair          Cognition Comprehension Comprehension assist level: Follows complex conversation/direction with extra time/assistive device  Expression Expression assist level: Expresses complex ideas: With extra time/assistive device  Social Interaction Social Interaction assist level: Interacts appropriately 90% of the time - Needs monitoring or encouragement for participation or interaction.  Problem Solving Problem solving assist level: Solves complex 90% of the time/cues < 10% of the time  Memory Memory assist level: Assistive device: No helper    Medical Problem List and Plan: 1. Abnormality of gait secondary to debility  Begin CIR 2. DVT  Prophylaxis/Anticoagulation: Pharmaceutical: Coumadin 3. Pain Management: Hydrocodone prn for shoulder/wrist pain  4. Mood: Stable on Zoloft 100 mg daily. LCSW to follow for evaluation and support.  5. Neuropsych: This patient is capable of making decisions on her own behalf. 6. Skin/Wound Care: Routine care, avoid prolonged pressure and shearing.  Neck wound healing, cont dressing 7. Fluids/Electrolytes/Nutrition: Monitor I/O.   8. NSTEMI/Hx of CAD: Treated medically. On Metoprolol, lipitor, ASA and Bidil 9. Afib with RVR: Monitor HR. Continue amiodarone, metoprolol and coumadin  10. DM type 2: Monitor BS ac/hs. Continue 70/30 insulin bid. Will use SSI for elevated BS.   Will monitor with increased activity and consider adjustments as necessary 11. Acute on chronic renal failure:   Continue IV lasix bid per nephrology, Cr slowly improving.   Follow recs per Nephro 12. HTN: Monitor BP   Continue Norvasc, furosemide, lopressor and Bidil.   Will monitor with increased activity 13. ABLA  Hb 8.0 on 5/25 (stable) 14. Hypoalbuminemia  Protein supplement started on 5/25  LOS (Days) 1 A FACE TO FACE EVALUATION WAS PERFORMED  Chandi Nicklin Lorie Phenix 10/19/2015 7:43 AM

## 2015-10-19 NOTE — Progress Notes (Signed)
Patient ID: Briana [name], female   DOB: 1946/02/10, 70 y.o.   MRN: [id number]  [facility] Progress Note   Assessment/ Plan:   1. AKI on Chronic kidney disease stage III (baseline creatinine 1.6-2.3) likely hemodynamically mediated with acute diastolic heart failure as well as A. fib with RVR requiring DCCV. Renal function continues to show slow improvement-unfortunately, furosemide not continued with transfer to inpatient rehabilitation and I will go ahead and restart her oral outpatient dose of furosemide as maintenance therapy. Clinically, she does not have any uremic signs or symptoms or acute needs for intervention. Electrolytes within acceptable limits. 2. Acute diastolic heart failure exacerbation: Appears to be exacerbated primarily by atrial fibrillation with RVR-currently appears improved (further helped by improving renal function/urine output).  3. Deconditioning status post admission for CHF exacerbation/pneumonia: Ongoing inpatient rehabilitation. 4. Atrial fibrillation status post DC CV: Currently appears to be in sinus rhythm, on amiodarone/warfarin 5. Anemia: Status post intravenous iron, will order Aranesp. She does not have any overt loss  Will sign off at this point-she has been instructed to follow up with her primary care provider/nephrologist upon discharge.  Subjective:   Reports to be feeling fair and had a better night with some improvement of shortness of breath.    Objective:   BP 154/42 mmHg  Pulse 58  Temp(Src) 99 F (37.2 C) (Oral)  Resp 19  Ht 5\' 4"  (1.626 m)  Wt 108.71 kg (239 lb 10.6 oz)  BMI 41.12 kg/m2  SpO2 93%  Intake/Output Summary (Last 24 hours) at 10/19/15 1203 Last data filed at 10/19/15 0700  Gross per 24 hour  Intake    360 ml  Output      0 ml  Net    360 ml   Weight change:   Physical Exam: [id number] comfortably up in her recliner, Eating lunch CVS: Pulse regular rhythm, S1 and S2 normal Resp: Decreased breath  sounds at bases otherwise clear Abd: Soft, obese, nontender Ext: 2+ pitting edema   Imaging: No results found.  Labs: BMET  Recent Labs Lab 10/13/15 0434 10/14/15 0315 10/15/15 0415 10/16/15 0420 10/17/15 0430 10/18/15 0425 10/19/15 0444  NA 135 137 137 140 140 139 141  K 3.8 3.8 4.0 4.3 4.6 4.4 4.6  CL 99* 99* 102 107 107 106 105  CO2 25 25 24 26 25 25 28   GLUCOSE 144* 96 129* 113* 136* 114* 138*  BUN 62* 68* 68* 59* 58* 57* 54*  CREATININE 4.54* 4.73* 4.31* 3.58* 3.11* 2.94* 2.69*  CALCIUM 8.1* 8.0* 8.3* 8.6* 8.6* 8.6* 8.8*  PHOS 5.3* 5.5* 4.3 3.9  --   --   --    CBC  Recent Labs Lab 10/16/15 0420 10/17/15 0430 10/18/15 0425 10/19/15 0444  WBC 10.3 9.2 7.6 7.6  NEUTROABS  --   --   --  5.0  HGB 8.2* 8.1* 7.9* 8.0*  HCT 27.0* 26.5* 26.4* 26.6*  MCV 87.9 86.9 88.3 89.0  PLT 211 209 203 199    Medications:    . amiodarone  200 mg Oral Daily  . amLODipine  10 mg Oral Daily  . aspirin EC  81 mg Oral Daily  . atorvastatin  80 mg Oral QPM  . calcium acetate  1,334 mg Oral TID WC  . furosemide  40 mg Intravenous BID  . furosemide  40 mg Oral Daily  . insulin aspart  0-20 Units Subcutaneous TID WC  . insulin aspart  0-5 Units Subcutaneous QHS  .  insulin aspart protamine- aspart  15 Units Subcutaneous BID WC  . isosorbide-hydrALAZINE  1 tablet Oral TID  . loratadine  10 mg Oral Daily  . metoprolol tartrate  25 mg Oral BID  . protein supplement  1 scoop Oral TID WC  . sertraline  100 mg Oral Daily  . warfarin  2.5 mg Oral ONCE-1800  . Warfarin - Pharmacist Dosing Inpatient   Does not apply [id number]   Elmarie Shiley, MD 10/19/2015, 12:03 PM

## 2015-10-19 NOTE — Progress Notes (Signed)
Patient information reviewed and entered into eRehab system by Kaina Orengo, RN, CRRN, PPS Coordinator.  Information including medical coding and functional independence measure will be reviewed and updated through discharge.     Per nursing patient was given "Data Collection Information Summary for Patients in Inpatient Rehabilitation Facilities with attached "Privacy Act Statement-Health Care Records" upon admission.  

## 2015-10-19 NOTE — Progress Notes (Signed)
ANTICOAGULATION CONSULT NOTE - Follow Up Consult  Pharmacy Consult for coumadin Indication: atrial fibrillation  Allergies  Allergen Reactions  . Carbamazepine Other (See Comments)    Reaction to tegretol - loopy  . Clonazepam Other (See Comments)    Unknown reaction  . Gemfibrozil Other (See Comments)    Patient is not aware of this allergy but states that she has side effects to a cholesterol medication in the past  . Macrobid [Nitrofurantoin] Nausea And Vomiting  . Oxycodone Other (See Comments)    hallucinations    Patient Measurements: Height: 5\' 4"  (162.6 cm) Weight: 239 lb 10.6 oz (108.71 kg) IBW/kg (Calculated) : 54.7 Heparin Dosing Weight:   Vital Signs: Temp: 99 F (37.2 C) (05/25 0510) Temp Source: Oral (05/25 0510) BP: 154/42 mmHg (05/25 0510) Pulse Rate: 58 (05/25 0510)  Labs:  Recent Labs  10/17/15 0430 10/18/15 0425 10/19/15 0444  HGB 8.1* 7.9* 8.0*  HCT 26.5* 26.4* 26.6*  PLT 209 203 199  LABPROT 20.6* 24.8* 27.3*  INR 1.77* 2.27* 2.58*  HEPARINUNFRC 0.70 0.59  --   CREATININE 3.11* 2.94* 2.69*    Estimated Creatinine Clearance: 23.4 mL/min (by C-G formula based on Cr of 2.69).   Medications:  Scheduled:  . amiodarone  200 mg Oral Daily  . amLODipine  10 mg Oral Daily  . aspirin EC  81 mg Oral Daily  . atorvastatin  80 mg Oral QPM  . calcium acetate  1,334 mg Oral TID WC  . furosemide  40 mg Intravenous BID  . insulin aspart  0-20 Units Subcutaneous TID WC  . insulin aspart  0-5 Units Subcutaneous QHS  . insulin aspart protamine- aspart  15 Units Subcutaneous BID WC  . isosorbide-hydrALAZINE  1 tablet Oral TID  . loratadine  10 mg Oral Daily  . metoprolol tartrate  25 mg Oral BID  . protein supplement  1 scoop Oral TID WC  . sertraline  100 mg Oral Daily  . Warfarin - Pharmacist Dosing Inpatient   Does not apply q1800   Infusions:    Assessment: 70 yo female with afib is currently on therapeutic coumadin.  INR today is up to 2.58  from 2.27.  Goal of Therapy:  INR 2-3 Monitor platelets by anticoagulation protocol: Yes   Plan:  Coumadin 2.5 mg po x 1 dose tonight Daily PT/INR  Laritza Vokes, Tsz-Yin 10/19/2015,8:09 AM

## 2015-10-19 NOTE — Evaluation (Signed)
Occupational Therapy Assessment and Plan  Patient Details  Name: Briana [name] MRN: [id number] Date of Birth: 1945-12-23  OT Diagnosis: muscle weakness (generalized) Rehab Potential: Rehab Potential (ACUTE ONLY): Good ELOS: 5-7 DAYS   Today's Date: 10/19/2015 OT Individual Time: 9977-4142 [id number] OT Individual Time Calculation (min): 74 min and 59 min     Problem List:  Patient Active Problem List   Diagnosis Date Noted  . Hypoalbuminemia due to protein-calorie malnutrition ([place])   . Debility 10/18/2015  . Abnormality of gait   . Long term current use of anticoagulant therapy   . Chronic pain syndrome   . Coronary artery disease involving native coronary artery of native heart without angina pectoris   . Hx of non-ST elevation myocardial infarction (NSTEMI)   . Paroxysmal atrial fibrillation (HCC)   . Diabetes mellitus type 2 in obese ([place])   . Chronic kidney disease (CKD), stage IV (severe) ([place])   . Benign essential HTN   . AKI (acute kidney injury) ([place])   . Non-ST elevation myocardial infarction (NSTEMI) ([place]) 10/14/2015  . Pulmonary edema   . Abnormal nuclear stress test   . Acute renal failure with tubular necrosis ([place])   . Hypertensive emergency   . Atrial fibrillation with rapid ventricular response ([place]) 10/05/2015  . CAP (community acquired pneumonia)   . CAD in native artery   . Pulmonary hypertension ([place])   . Atrial fibrillation with RVR ([place])   . Acute on chronic renal failure ([place])   . Uncontrolled type 2 diabetes mellitus with complication ([place])   . Physical deconditioning   . Acute respiratory failure with hypoxia ([place])   . Acute pulmonary edema (HCC)   . Diastolic CHF, acute on chronic (HCC)   . Acute blood loss anemia   . Controlled diabetes mellitus type 2 with complications ([place])   . Acute respiratory failure ([place]) 10/01/2015  . Hypoxia 10/01/2015  . Elevated troponin 10/01/2015  . Chest pain   . DVT (deep venous thrombosis) ([place])   .  Hypoxemia   . Acute on chronic diastolic CHF (congestive heart failure) ([place]) 05/31/2015  . Essential hypertension, benign 03/28/2015  . Hyperlipidemia 03/28/2015  . Vitamin D deficiency 03/28/2015  . CKD (chronic kidney disease) stage 3, GFR 30-59 ml/min 03/04/2014  . Acute renal failure ([place]) 03/04/2014  . UTI (lower urinary tract infection) 03/02/2014  . Acute encephalopathy 03/02/2014  . Dehydration 03/02/2014  . Generalized weakness 03/02/2014  . Morbid obesity ([place]) 03/02/2014  . Depressed mood 03/02/2014  . Type 2 diabetes mellitus with stage 3 chronic kidney disease ([place]) 03/02/2014  . Hx of adenomatous colonic polyps 01/28/2012  . Occlusion and stenosis of carotid artery without mention of cerebral infarction 12/03/2011    Past Medical History:  Past Medical History  Diagnosis Date  . Diabetes mellitus   . Hypertension   . Hyperlipidemia   . Heart murmur   . Peripheral vascular disease ([place])   . Arthritis   . Depression with anxiety   . Carotid artery occlusion   . Chronic kidney disease   . Neuropathy ([place])   . CAD (coronary artery disease)   . Rheumatic fever   . Sleep apnea   . PAD (peripheral artery disease) ([place])   . Adenomatous colon polyp 09/2007  . Atrial fibrillation with rapid ventricular response ([place]) 10/05/2015   Past Surgical History:  Past Surgical History  Procedure Laterality Date  . Coronary artery bypass graft  2005  . Carotid endarterectomy  10/04/2009  Right CEA  . Tubal ligation    . Polypectomy  02/25/2012    Procedure: POLYPECTOMY;  Surgeon: Danie Binder, MD;  Location: [facility];  Service: Endoscopy;  Laterality: N/A;    Assessment & Plan Clinical Impression: Patient is a 70 y.o. year old female with history of CAD s/p CABG, CAS, HTN with LVH, CKD, chronic diastolic heart failure who was admitted via [facility] with chest pain, fever, hypoxia and leucocytosis on 09/30/15. She was treated for CAP as well as acute on chronic CHF. She found to  have ST changes with positive cardiac enzymes due to NSTEMI and started on IV heparin. She develop A fib with RVR with drip in H/H and was treated with amiodarone and Cardizem. She required DCCV by Dr. Oval Linsey on 05/15 with improvement in heart rate. NM cardiac stress test showed anterior defect with concerns for peri infarct ischemia but patient asymptomatic and medical management recommended untill renal function improves. Dr. Joelyn Oms was consulted due to worsening of renal failure. She was treated briefly with HD and diuretics resumed to help with UOP. She continues to require IV diuresis and HD cath removed today as creatinine is showing improvement. To start IV iron to load depleted stores followed by epo to help with anemia of chronic disease? Has been transitioned to coumadin and currently in NSR on amiodarone. She continues to have hypoxia with activity as well as decrease in endurance.  .  Patient transferred to CIR on 10/18/2015 .    Patient currently requires min with basic self-care skills and IADL secondary to muscle weakness, decreased cardiorespiratoy endurance and decreased oxygen support and decreased standing balance and decreased balance strategies.  Prior to hospitalization, patient could complete ADLs and IADLs with independent .  Patient will benefit from skilled intervention to increase independence with basic self-care skills and increase level of independence with iADL prior to discharge home with care partner.  Anticipate patient will require  follow up home health.  OT - End of Session Activity Tolerance: Decreased this session Endurance Deficit: Yes Endurance Deficit Description: cardiopulmonary OT Assessment Rehab Potential (ACUTE ONLY): Good OT Patient demonstrates impairments in the following area(s): Balance;Endurance;Motor;Safety OT Basic ADL's Functional Problem(s): Grooming;Bathing;Dressing;Toileting OT Advanced ADL's Functional Problem(s): Simple Meal  Preparation;Laundry OT Transfers Functional Problem(s): Toilet;Tub/Shower OT Additional Impairment(s): None OT Plan OT Intensity: Minimum of 1-2 x/day, 45 to 90 minutes OT Frequency: 5 out of 7 days OT Duration/Estimated Length of Stay: 5-7 DAYS OT Treatment/Interventions: Balance/vestibular training;Community reintegration;Discharge planning;Pain management;Functional mobility training;DME/adaptive equipment instruction;Patient/family education;Therapeutic Activities;Therapeutic Exercise;Self Care/advanced ADL retraining;UE/LE Strength taining/ROM;UE/LE Coordination activities OT Self Feeding Anticipated Outcome(s): N/A OT Basic Self-Care Anticipated Outcome(s): MOD I OVERALL OT Toileting Anticipated Outcome(s): MOD I OVERALL OT Bathroom Transfers Anticipated Outcome(s): MOD I OVERALL OT Recommendation Recommendations for Other Services: Other (comment) Patient destination: Home Follow Up Recommendations: Home health OT Equipment Recommended: None recommended by OT   Skilled Therapeutic Intervention Session 1: Upon entering the room, pt seated in recliner chair with no c/o pain this session. OT educated pt on OT purpose, POC, and goals with pt verbalizing understanding and agreement. Pt declined shower this session but agreeable to bathing and dressing while seated in chair at sink. Pt required steady assist for balance with LB clothing management and hygiene. Pt needing multiple rest breaks secondary to fatigue. Pt remained on 1 L O2 via [place] throughout session with oxygen being 92% or higher. Pt transferred to recliner chair at end of session with steady assist and call bell  with all needed items within reach upon exiting the room.   Session 2: Upon entering the room, pt seated in recliner chair with no c/o pain. Pt does not have O2 on at this time but O2 at 92%. OT providing pt with paper handouts for energy conservation. OT providing functional examples of energy conservation techniques with  pt verbalizing understanding. Education to continue. Pt ambulated to bathroom with ,SPC and steady assist for toileting. Pt performed clothing management and hygiene with steady assistance and use of grab bar. Pt then ambulating 100' with RW to ADL apartment. Pt requiring ~ 2 minutes to recover to 91% without O2. Pt placed on O2 via [place] at 1 L to return to room in same manner. RN notified.   OT Evaluation Vital Signs Therapy Vitals Temp: 99.3 F (37.4 C) Temp Source: Oral Pulse Rate: (!) 59 Resp: 18 BP: (!) 153/44 mmHg Patient Position (if appropriate): Sitting Oxygen Therapy SpO2: 95 % O2 Device: Nasal Cannula O2 Flow Rate (L/min): 1 L/min Pain  no c/o pain  Home Living/Prior Functioning Home Living Available Help at Discharge: Family, Available PRN/intermittently Type of Home: House Home Access: Stairs to enter Technical brewer of Steps: 3 Entrance Stairs-Rails: Left Home Layout: One level Bathroom Shower/Tub: Gaffer, Architectural technologist: Handicapped height  Lives With: Spouse Prior Function Level of Independence: Independent with transfers, Independent with gait (SPC in community)  Able to Take Stairs?: Yes (reports some difficulty with stairs for several years ) Driving: Yes Vocation: Retired Comments: Used cane at times for extended distance Vision/Perception    wears glasses for reading. No visual impairment. Cognition Overall Cognitive Status: Within Functional Limits for tasks assessed Arousal/Alertness: Awake/alert Orientation Level: Person;Place Year: 2017 Month: January Day of Week: Correct Memory: Appears intact Immediate Memory Recall: Sock;Blue;Bed Memory Recall: Sock;Blue;Bed Memory Recall Sock: Without Cue Memory Recall Blue: Without Cue Memory Recall Bed: Without Cue Safety/Judgment: Appears intact Motor  Motor Motor: Within Functional Limits Motor - Skilled Clinical Observations: generalized deconditioning Mobility   Transfers Sit to Stand: 4: Min guard Stand to Sit: 4: Min guard  Trunk/Postural Assessment  Cervical Assessment Cervical Assessment: Within Functional Limits Thoracic Assessment Thoracic Assessment: Within Functional Limits Lumbar Assessment Lumbar Assessment: Within Functional Limits Postural Control Postural Control: Within Functional Limits  Balance Balance Balance Assessed: Yes Static Standing Balance Static Standing - Level of Assistance: 5: Stand by assistance Dynamic Standing Balance Dynamic Standing - Level of Assistance: 4: Min assist Extremity/Trunk Assessment RUE Assessment RUE Assessment: Within Functional Limits LUE Assessment LUE Assessment: Within Functional Limits   See Function Navigator for Current Functional Status.   Refer to Care Plan for Long Term Goals  Recommendations for other services: None  Discharge Criteria: Patient will be discharged from OT if patient refuses treatment 3 consecutive times without medical reason, if treatment goals not met, if there is a change in medical status, if patient makes no progress towards goals or if patient is discharged from hospital.  The above assessment, treatment plan, treatment alternatives and goals were discussed and mutually agreed upon: by patient  Phineas Semen 10/19/2015, 4:21 PM

## 2015-10-19 NOTE — IPOC Note (Signed)
Overall Plan of Care [facility]) Patient Details Name: Briana [name] MRN: [id number] DOB: 11-Jan-1946  Admitting Diagnosis: debility  Hospital Problems: Active Problems:   Debility   Abnormality of gait   Long term current use of anticoagulant therapy   Chronic pain syndrome   Coronary artery disease involving native coronary artery of native heart without angina pectoris   Hx of non-ST elevation myocardial infarction (NSTEMI)   Paroxysmal atrial fibrillation (HCC)   Diabetes mellitus type 2 in obese (HCC)   Chronic kidney disease (CKD), stage IV (severe) (HCC)   Benign essential HTN   Hypoalbuminemia due to protein-calorie malnutrition (HCC)     Functional Problem List: Nursing Bladder, Edema, Endurance, Medication Management, Motor, Perception, Safety, Pain, Skin Integrity  PT Balance, Endurance, Safety  OT Balance, Endurance, Motor, Safety  SLP    TR         Basic ADL's: OT Grooming, Bathing, Dressing, Toileting     Advanced  ADL's: OT Simple Meal Preparation, Laundry     Transfers: PT Bed Mobility, Bed to Chair, Musician, Manufacturing systems engineer, Metallurgist: PT Stairs, Ambulation     Additional Impairments: OT None  SLP        TR      Anticipated Outcomes Item Anticipated Outcome  Self Feeding N/A  Swallowing      Basic self-care  MOD I OVERALL  Toileting  MOD I OVERALL   Bathroom Transfers MOD I OVERALL  Bowel/Bladder  Pt will manage bladder and bowel with mod I assist   Transfers  mod I  Locomotion  mod I  Communication     Cognition     Pain  Pt will manage pain at 4 or less on a scale of 0-10.   Safety/Judgment  Pt will remain free of falls and injury with min assist    Therapy Plan: PT Intensity: Minimum of 1-2 x/day ,45 to 90 minutes PT Frequency: 5 out of 7 days PT Duration Estimated Length of Stay: 5-7 OT Intensity: Minimum of 1-2 x/day, 45 to 90 minutes OT Frequency: 5 out of 7 days OT Duration/Estimated Length of  Stay: 5-7 DAYS         Team Interventions: Nursing Interventions Bladder Management, Disease Management/Prevention, Pain Management, Medication Management, Skin Care/Wound Management, Discharge Planning, Patient/Family Education  PT interventions Ambulation/gait training, Community reintegration, DME/adaptive equipment instruction, Neuromuscular re-education, Stair training, UE/LE Strength taining/ROM, UE/LE Coordination activities, Therapeutic Activities, Training and development officer, Discharge planning, Functional mobility training, Patient/family education, Therapeutic Exercise  OT Interventions Training and development officer, Community reintegration, Discharge planning, Pain management, Functional mobility training, DME/adaptive equipment instruction, Patient/family education, Therapeutic Activities, Therapeutic Exercise, Self Care/advanced ADL retraining, UE/LE Strength taining/ROM, UE/LE Coordination activities  SLP Interventions    TR Interventions    SW/CM Interventions Discharge Planning, Psychosocial Support, Patient/Family Education    Team Discharge Planning: Destination: PT-Home ,OT- Home , SLP-  Projected Follow-up: PT-Home health PT, OT-  Home health OT, SLP-  Projected Equipment Needs: PT- , OT- None recommended by OT, SLP-  Equipment Details: PT-pt has straight cane at home, OT-  Patient/family involved in discharge planning: PT- Patient,  OT-Patient, SLP-   MD ELOS: 5-7 days. Medical Rehab Prognosis:  Excellent Assessment: 70 y.o. female with history of CAD s/p CABG, CAS, HTN with LVH, CKD, chronic diastolic heart failure who was admitted via [facility] with chest pain, fever, hypoxia and leucocytosis on 09/30/15. She was treated for CAP as well as acute on chronic CHF. She found  to have ST changes with positive cardiac enzymes due to NSTEMI and started on IV heparin. She develop A fib with RVR with drip in H/H and was treated with amiodarone and Cardizem. She required DCCV by Dr.  Oval Linsey on 05/15 with improvement in heart rate. NM cardiac stress test showed anterior defect with concerns for peri infarct ischemia but patient asymptomatic and medical management recommended untill renal function improves. Dr. Joelyn Oms was consulted due to worsening of renal failure. She was treated briefly with HD and diuretics resumed to help with UOP. Her creatinine eventually showed improvement. Pt starting IV iron to load depleted stores followed by epo to help with anemia of chronic disease? Has been transitioned to coumadin and currently in NSR on amiodarone. She continues to have hypoxia with activity as well as decrease in endurance and abnormality of gait.  Will set goals for Mod I with therapies.  See Team Conference Notes for weekly updates to the plan of care

## 2015-10-19 NOTE — Evaluation (Signed)
Physical Therapy Assessment and Plan  Patient Details  Name: Briana [name] MRN: [id number] Date of Birth: Aug 10, 1945  PT Diagnosis: Muscle weakness Rehab Potential: Good ELOS: 5-7   Today's Date: 10/19/2015 PT Individual Time: 6644-0347 PT Individual Time Calculation (min): 59 min    Problem List:  Patient Active Problem List   Diagnosis Date Noted  . Hypoalbuminemia due to protein-calorie malnutrition ([place])   . Debility 10/18/2015  . Abnormality of gait   . Long term current use of anticoagulant therapy   . Chronic pain syndrome   . Coronary artery disease involving native coronary artery of native heart without angina pectoris   . Hx of non-ST elevation myocardial infarction (NSTEMI)   . Paroxysmal atrial fibrillation (HCC)   . Diabetes mellitus type 2 in obese ([place])   . Chronic kidney disease (CKD), stage IV (severe) ([place])   . Benign essential HTN   . AKI (acute kidney injury) ([place])   . Non-ST elevation myocardial infarction (NSTEMI) ([place]) 10/14/2015  . Pulmonary edema   . Abnormal nuclear stress test   . Acute renal failure with tubular necrosis ([place])   . Hypertensive emergency   . Atrial fibrillation with rapid ventricular response ([place]) 10/05/2015  . CAP (community acquired pneumonia)   . CAD in native artery   . Pulmonary hypertension ([place])   . Atrial fibrillation with RVR ([place])   . Acute on chronic renal failure ([place])   . Uncontrolled type 2 diabetes mellitus with complication ([place])   . Physical deconditioning   . Acute respiratory failure with hypoxia ([place])   . Acute pulmonary edema (HCC)   . Diastolic CHF, acute on chronic (HCC)   . Acute blood loss anemia   . Controlled diabetes mellitus type 2 with complications ([place])   . Acute respiratory failure ([place]) 10/01/2015  . Hypoxia 10/01/2015  . Elevated troponin 10/01/2015  . Chest pain   . DVT (deep venous thrombosis) ([place])   . Hypoxemia   . Acute on chronic diastolic CHF (congestive heart failure) ([place])  05/31/2015  . Essential hypertension, benign 03/28/2015  . Hyperlipidemia 03/28/2015  . Vitamin D deficiency 03/28/2015  . CKD (chronic kidney disease) stage 3, GFR 30-59 ml/min 03/04/2014  . Acute renal failure ([place]) 03/04/2014  . UTI (lower urinary tract infection) 03/02/2014  . Acute encephalopathy 03/02/2014  . Dehydration 03/02/2014  . Generalized weakness 03/02/2014  . Morbid obesity ([place]) 03/02/2014  . Depressed mood 03/02/2014  . Type 2 diabetes mellitus with stage 3 chronic kidney disease ([place]) 03/02/2014  . Hx of adenomatous colonic polyps 01/28/2012  . Occlusion and stenosis of carotid artery without mention of cerebral infarction 12/03/2011    Past Medical History:  Past Medical History  Diagnosis Date  . Diabetes mellitus   . Hypertension   . Hyperlipidemia   . Heart murmur   . Peripheral vascular disease ([place])   . Arthritis   . Depression with anxiety   . Carotid artery occlusion   . Chronic kidney disease   . Neuropathy ([place])   . CAD (coronary artery disease)   . Rheumatic fever   . Sleep apnea   . PAD (peripheral artery disease) ([place])   . Adenomatous colon polyp 09/2007  . Atrial fibrillation with rapid ventricular response ([place]) 10/05/2015   Past Surgical History:  Past Surgical History  Procedure Laterality Date  . Coronary artery bypass graft  2005  . Carotid endarterectomy  10/04/2009    Right CEA  . Tubal ligation    .  Polypectomy  02/25/2012    Procedure: POLYPECTOMY;  Surgeon: Danie Binder, MD;  Location: [facility];  Service: Endoscopy;  Laterality: N/A;    Assessment & Plan Clinical Impression: A 70 y.o. right handed female with history of chronic renal insufficiency 1.6-2.3, diabetes mellitus, CAD status post CABG, diastolic congestive heart failure. Patient lives with spouse who has dementia. One level home 3 steps to entry. Independent with assistive device prior to admission. Presented 10/01/2015 to [facility] with chest pain. In the ED  found to have hypoxia, fever 101.6, WBC 15,500. EKG lateral ST depression with mild elevated troponin 0.12. BNP 894. Chest x-ray showed alveolar opacities most dense in the right base. Cultures were obtained. Patient transferred to [facility] for ongoing care. Placed on broad-spectrum antibiotics. Cardiology follow-up for chest pain. Echocardiogram with ejection fraction 65% grade 3 diastolic dysfunction. Histology function was normal. Myocardial imaging VQ scan concern for mild peri-infarct ischemia involving the anterior wall and anterior septal wall. Evidence for infarcts involving the anterior and lateral walls. Patient maintained on aspirin as well as heparin therapy. A. fib with RVR status post DCCV 10/08/2013. Renal service is consulted 10/10/2015 for elevated creatinine from baseline 1.6-2.3-5.32. Renal ultrasound showed no hydronephrosis. Hemodialysis catheter inserted 10/11/2015 and underwent intermittent hemodialysis.  Is now off hemodialysis with improving urine output.  Was volume overloaded and treated with diuretics.  Physical and occupational therapy evaluations completed with recommendations of physical medicine rehabilitation consult.  Patient transferred to CIR on 10/18/2015 .   Patient currently requires min with mobility secondary to muscle weakness and decreased cardiorespiratoy endurance and decreased oxygen support.  Prior to hospitalization, patient was modified independent  with mobility and lived with Spouse in a House home.  Home access is 3Stairs to enter.  Patient will benefit from skilled PT intervention to maximize safe functional mobility and minimize fall risk for planned discharge home with intermittent assist.  Anticipate patient will benefit from follow up [facility] at discharge.  PT - End of Session Activity Tolerance: Tolerates 10 - 20 min activity with multiple rests Endurance Deficit: Yes Endurance Deficit Description: cardiopulmonary PT Assessment Rehab Potential  (ACUTE/IP ONLY): Good Barriers to Discharge: Decreased caregiver support PT Patient demonstrates impairments in the following area(s): Balance;Endurance;Safety PT Transfers Functional Problem(s): Bed Mobility;Bed to Chair;Car;Furniture PT Locomotion Functional Problem(s): Stairs;Ambulation PT Plan PT Intensity: Minimum of 1-2 x/day ,45 to 90 minutes PT Frequency: 5 out of 7 days PT Duration Estimated Length of Stay: 5-7 PT Treatment/Interventions: Ambulation/gait training;Community reintegration;DME/adaptive equipment instruction;Neuromuscular re-education;Stair training;UE/LE Strength taining/ROM;UE/LE Coordination activities;Therapeutic Activities;Balance/vestibular training;Discharge planning;Functional mobility training;Patient/family education;Therapeutic Exercise PT Transfers Anticipated Outcome(s): mod I PT Locomotion Anticipated Outcome(s): mod I PT Recommendation Follow Up Recommendations: Home health PT Patient destination: Home Equipment Details: pt has straight cane at home  Skilled Therapeutic Intervention Pt received resting in recliner with no c/o pain and agreeable to therapy session.  Skilled intervention began after initial assessment.  PT provided pt education on role of physical therapy, goals of care, treatment plan, and estimated length of stay.  Discussed d/c planning regarding pt planning to build a ramp (PTA) and whether it will be done in time for d/c.  PT instructed pt in gait with SPC with steady assist>close supervision, stair negotiation with L rail and SPC with steady assist, car transfers, and sit<>stand transfers.  Pt returned to room at end of session and positioned in recliner with call bell in reach and needs met.   PT Evaluation Precautions/Restrictions Precautions Precautions: Fall  Precaution Comments: monitor HR an O2 sats Restrictions Weight Bearing Restrictions: No Home Living/Prior Functioning Home Living Available Help at Discharge:  Family;Available PRN/intermittently Type of Home: House Home Access: Stairs to enter [employer] of Steps: 3 Entrance Stairs-Rails: Left Home Layout: One level Bathroom Shower/Tub: Walk-in Sales promotion account executive: Handicapped height  Lives With: Spouse Prior Function Level of Independence: Independent with transfers;Independent with gait (SPC in community)  Able to Take Stairs?: Yes (reports some difficulty with stairs for several years ) Driving: Yes Vocation: Retired Comments: Used cane at times for extended distance  Cognition Overall Cognitive Status: Within Functional Limits for tasks assessed Arousal/Alertness: Awake/alert Orientation Level: Oriented X4 Memory: Appears intact Safety/Judgment: Appears intact Sensation Sensation Light Touch: Appears Intact (denies N/T except in L groin 2/2 old surgery) Stereognosis: Not tested Hot/Cold: Appears Intact Coordination Gross Motor Movements are Fluid and Coordinated: Yes Fine Motor Movements are Fluid and Coordinated: Yes Motor  Motor Motor: Within Functional Limits Motor - Skilled Clinical Observations: generalized deconditioning  Mobility Transfers Transfers: Yes Sit to Stand: 4: Min guard Stand to Sit: 4: Min guard Locomotion  Ambulation Ambulation: Yes Ambulation/Gait Assistance: 4: Min guard Ambulation Distance (Feet): 150 Feet Assistive device: Straight cane Stairs / Additional Locomotion Stairs: Yes Stairs Assistance: 4: Min guard Stair Management Technique: One rail Right;One rail Left;With cane Number of Stairs: 4 Height of Stairs: 6 Wheelchair Mobility Wheelchair Mobility: Yes Wheelchair Assistance: 5: Careers information officer: Both upper extremities Wheelchair Parts Management: Needs assistance Distance: 150  Trunk/Postural Assessment  Cervical Assessment Cervical Assessment: Within Functional Limits Thoracic Assessment Thoracic Assessment: Within Functional  Limits Lumbar Assessment Lumbar Assessment: Within Functional Limits Postural Control Postural Control: Within Functional Limits  Balance Balance Balance Assessed: Yes Static Standing Balance Static Standing - Level of Assistance: 5: Stand by assistance Dynamic Standing Balance Dynamic Standing - Level of Assistance: 4: Min assist Extremity Assessment      RLE Assessment RLE Assessment: Exceptions to [facility] RLE AROM (degrees) RLE Overall AROM Comments: WFL assessed in sitting RLE Strength Right Hip Flexion: 3/5 Right Knee Flexion: 4/5 Right Knee Extension: 4/5 Right Ankle Dorsiflexion: 3/5 Right Ankle Plantar Flexion: 3/5 LLE Assessment LLE Assessment: Exceptions to WFL LLE AROM (degrees) LLE Overall AROM Comments: WFL assessed in sitting LLE Strength Left Hip Flexion: 3/5 Left Knee Flexion: 4/5 Left Knee Extension: 4/5 Left Ankle Dorsiflexion: 3/5 Left Ankle Plantar Flexion: 3/5   See Function Navigator for Current Functional Status.   Refer to Care Plan for Long Term Goals  Recommendations for other services: None  Discharge Criteria: Patient will be discharged from PT if patient refuses treatment 3 consecutive times without medical reason, if treatment goals not met, if there is a change in medical status, if patient makes no progress towards goals or if patient is discharged from hospital.  The above assessment, treatment plan, treatment alternatives and goals were discussed and mutually agreed upon: by patient  Earnest Conroy Penven-Crew 10/19/2015, 12:34 PM

## 2015-10-20 ENCOUNTER — Telehealth: Payer: Self-pay

## 2015-10-20 ENCOUNTER — Inpatient Hospital Stay (HOSPITAL_COMMUNITY): Payer: Medicare Other | Admitting: Occupational Therapy

## 2015-10-20 ENCOUNTER — Inpatient Hospital Stay (HOSPITAL_COMMUNITY): Payer: Medicare Other | Admitting: Physical Therapy

## 2015-10-20 LAB — GLUCOSE, CAPILLARY
GLUCOSE-CAPILLARY: 60 mg/dL — AB (ref 65–99)
GLUCOSE-CAPILLARY: 87 mg/dL (ref 65–99)
Glucose-Capillary: 181 mg/dL — ABNORMAL HIGH (ref 65–99)
Glucose-Capillary: 119 mg/dL — ABNORMAL HIGH (ref 65–99)

## 2015-10-20 LAB — OCCULT BLOOD X 1 CARD TO LAB, STOOL: FECAL OCCULT BLD: NEGATIVE

## 2015-10-20 LAB — PROTIME-INR
INR: 2.41 — ABNORMAL HIGH (ref 0.00–1.49)
PROTHROMBIN TIME: 26 s — AB (ref 11.6–15.2)

## 2015-10-20 MED ORDER — WARFARIN SODIUM 5 MG PO TABS
5.0000 mg | ORAL_TABLET | Freq: Once | ORAL | Status: AC
  Administered 2015-10-20: 5 mg via ORAL
  Filled 2015-10-20: qty 1

## 2015-10-20 MED ORDER — ISOSORB DINITRATE-HYDRALAZINE 20-37.5 MG PO TABS
2.0000 | ORAL_TABLET | Freq: Three times a day (TID) | ORAL | Status: DC
  Administered 2015-10-20 – 2015-10-25 (×14): 2 via ORAL
  Filled 2015-10-20 (×16): qty 2

## 2015-10-20 NOTE — Progress Notes (Signed)
Social Work Assessment and Plan  Patient Details  Name: Briana [name] MRN: [id number] Date of Birth: 1946/01/16  Today's Date: 10/19/2015  Problem List:  Patient Active Problem List   Diagnosis Date Noted  . Hypoalbuminemia due to protein-calorie malnutrition ([place])   . Debility 10/18/2015  . Abnormality of gait   . Long term current use of anticoagulant therapy   . Chronic pain syndrome   . Coronary artery disease involving native coronary artery of native heart without angina pectoris   . Hx of non-ST elevation myocardial infarction (NSTEMI)   . Paroxysmal atrial fibrillation (HCC)   . Diabetes mellitus type 2 in obese ([place])   . Chronic kidney disease (CKD), stage IV (severe) ([place])   . Benign essential HTN   . AKI (acute kidney injury) ([place])   . Non-ST elevation myocardial infarction (NSTEMI) ([place]) 10/14/2015  . Pulmonary edema   . Abnormal nuclear stress test   . Acute renal failure with tubular necrosis ([place])   . Hypertensive emergency   . Atrial fibrillation with rapid ventricular response ([place]) 10/05/2015  . CAP (community acquired pneumonia)   . CAD in native artery   . Pulmonary hypertension ([place])   . Atrial fibrillation with RVR ([place])   . Acute on chronic renal failure ([place])   . Uncontrolled type 2 diabetes mellitus with complication ([place])   . Physical deconditioning   . Acute respiratory failure with hypoxia ([place])   . Acute pulmonary edema (HCC)   . Diastolic CHF, acute on chronic (HCC)   . Acute blood loss anemia   . Controlled diabetes mellitus type 2 with complications ([place])   . Acute respiratory failure ([place]) 10/01/2015  . Hypoxia 10/01/2015  . Elevated troponin 10/01/2015  . Chest pain   . DVT (deep venous thrombosis) ([place])   . Hypoxemia   . Acute on chronic diastolic CHF (congestive heart failure) ([place]) 05/31/2015  . Essential hypertension, benign 03/28/2015  . Hyperlipidemia 03/28/2015  . Vitamin D deficiency 03/28/2015  . CKD (chronic kidney  disease) stage 3, GFR 30-59 ml/min 03/04/2014  . Acute renal failure ([place]) 03/04/2014  . UTI (lower urinary tract infection) 03/02/2014  . Acute encephalopathy 03/02/2014  . Dehydration 03/02/2014  . Generalized weakness 03/02/2014  . Morbid obesity ([place]) 03/02/2014  . Depressed mood 03/02/2014  . Type 2 diabetes mellitus with stage 3 chronic kidney disease ([place]) 03/02/2014  . Hx of adenomatous colonic polyps 01/28/2012  . Occlusion and stenosis of carotid artery without mention of cerebral infarction 12/03/2011   Past Medical History:  Past Medical History  Diagnosis Date  . Diabetes mellitus   . Hypertension   . Hyperlipidemia   . Heart murmur   . Peripheral vascular disease ([place])   . Arthritis   . Depression with anxiety   . Carotid artery occlusion   . Chronic kidney disease   . Neuropathy ([place])   . CAD (coronary artery disease)   . Rheumatic fever   . Sleep apnea   . PAD (peripheral artery disease) ([place])   . Adenomatous colon polyp 09/2007  . Atrial fibrillation with rapid ventricular response ([place]) 10/05/2015   Past Surgical History:  Past Surgical History  Procedure Laterality Date  . Coronary artery bypass graft  2005  . Carotid endarterectomy  10/04/2009    Right CEA  . Tubal ligation    . Polypectomy  02/25/2012    Procedure: POLYPECTOMY;  Surgeon: Danie Binder, MD;  Location: [facility];  Service: Endoscopy;  Laterality: N/A;  Social History:  reports that she has never smoked. She has never used smokeless tobacco. She reports that she does not drink alcohol or use illicit drugs.  Family / Support Systems Marital Status: Married How Long?: 77 years Patient Roles: Spouse, Parent (has a husband and a dtr) Spouse/Significant Other: Riannon Mukherjee - husband - [phone] Children: Levy Pupa - dtr - [phone] Other Supports: Glennon Mac - friend - [phone]- Anticipated Caregiver: self, daughter Ability/Limitations of Caregiver: Husband has mild  dementia and is HOH, but is doing okay to be alone.  Family and neighbors check on him regularly since pt has been admitted. Caregiver Availability: Intermittent Family Dynamics: close dtr and friends/neighbors  Social History Preferred language: English Religion: Christian Read: Yes Write: Yes Employment Status: Disabled Date Retired/Disabled/Unemployed: 2005 Age Retired: 18 Public relations account executive Issues: none reported Guardian/Conservator: N/A - MD has stated that pt is capable of making her own decisions.   Abuse/Neglect Physical Abuse: Denies Verbal Abuse: Denies Sexual Abuse: Denies Exploitation of patient/patient's resources: Denies Self-Neglect: Denies  Emotional Status Pt's affect, behavior and adjustment status: Pt has a positive outlook on her recovery and is motivated to work hard and get home. Recent Psychosocial Issues: Pt had a granddtr who died 2 1/2 years ago.  She is still grieving her loss and cannot really discuss this with her dtr and husband, as they too are still grieving. Psychiatric History: none diagnosed, but pt reports being in a dark place after granddtr's death.  They were very close.  She is religious, but did have thoughts of ending her life when she died.  Her faith kept her from following through with thoughts.  No suicidal/homicidal thoughts currently. Substance Abuse History: none reported  Patient / Family Perceptions, Expectations & Goals Pt/Family understanding of illness & functional limitations: Pt understands her condition and is ready to get better. Premorbid pt/family roles/activities: Pt likes to be outside - fish, gardening, etc. Anticipated changes in roles/activities/participation: Pt would like to resume these activiites as she is able. Pt/family expectations/goals: Pt wants to get well enough to go home.  Community [employer] Agencies: None Premorbid Home Care/DME Agencies: Other (Comment) (Pt had HH in the past.  May  have been through [facility] to investigate.  Pt would like to use them again, but is okay with another agency too.) Transportation available at discharge: dtr Resource referrals recommended: Neuropsychology  Discharge Planning Living Arrangements: Spouse/significant other Support Systems: Spouse/significant other, Children, Other relatives, Friends/neighbors Type of Residence: Private residence Insurance Resources: [employer], Multimedia programmer (specify) Web designer) Financial Resources: SSD, Other (Comment) (retirement from [employer]) Financial Screen Referred: No Money Management: Patient Does the patient have any problems obtaining your medications?: No Home Management: Pt was doing most of the inside chores.  Dtr will help.  Husband tried to help.  Has a neighbor who helps with outside chores. Patient/Family Preliminary Plans: Pt plans to return to her home with her husband and dtr. Barriers to Discharge: Steps Social Work Anticipated Follow Up Needs: HH/OP Expected length of stay: 5 to 7 days  Clinical Impression CSW met with pt to introduce self and role of CSW, as well as to complete assessment.  Pt was very open and talkative with CSW, sharing the story of her young granddaugher's death 2 1/2 years ago.  CSW provided supportive listening and pt stated that she appreciated the chance to talk about her, as her dtr and husband don't like to talk about her.  Pt  is doing better now, emotionally, and relies on her faith to help her through life's struggles.  Pt is motivated to get well and is a Scientist, research (physical sciences).  She knows she will not be on CIR long and wants to make the best of it.  She has good support from her husband, dtr, friends and extended family.  Pt has a handicap accessible bathroom with walk in shower, grab bars, hand held shower head, and shower seat.  She also has 2 single point canes, handicap placard for the car, and lift chair.  Pt has [employer] in the past likely from  [place] to try to resume the same agency for her [place] at d/c.  Pt is on track for d/c on 10-24-15.  CSW will continue to follow and assist as needed.  Mellie Buccellato, Silvestre Mesi 10/20/2015, 8:41 AM

## 2015-10-20 NOTE — Progress Notes (Signed)
ANTICOAGULATION CONSULT NOTE - Follow Up Consult  Pharmacy Consult for coumadin Indication: atrial fibrillation  Allergies  Allergen Reactions  . Carbamazepine Other (See Comments)    Reaction to tegretol - loopy  . Clonazepam Other (See Comments)    Unknown reaction  . Gemfibrozil Other (See Comments)    Patient is not aware of this allergy but states that she has side effects to a cholesterol medication in the past  . Macrobid [Nitrofurantoin] Nausea And Vomiting  . Oxycodone Other (See Comments)    hallucinations    Patient Measurements: Height: 5\' 4"  (162.6 cm) Weight: 241 lb (109.317 kg) IBW/kg (Calculated) : 54.7 Heparin Dosing Weight:   Vital Signs: Temp: 97.9 F (36.6 C) (05/26 0608) Temp Source: Oral (05/26 0608) BP: 168/53 mmHg (05/26 0608) Pulse Rate: 52 (05/26 0608)  Labs:  Recent Labs  10/18/15 0425 10/19/15 0444 10/20/15 0616  HGB 7.9* 8.0*  --   HCT 26.4* 26.6*  --   PLT 203 199  --   LABPROT 24.8* 27.3* 26.0*  INR 2.27* 2.58* 2.41*  HEPARINUNFRC 0.59  --   --   CREATININE 2.94* 2.69*  --     Estimated Creatinine Clearance: 23.5 mL/min (by C-G formula based on Cr of 2.69).   Medications:  Scheduled:  . amiodarone  200 mg Oral Daily  . amLODipine  10 mg Oral Daily  . aspirin EC  81 mg Oral Daily  . atorvastatin  80 mg Oral QPM  . calcium acetate  1,334 mg Oral TID WC  . darbepoetin (ARANESP) injection - NON-DIALYSIS  100 mcg Subcutaneous Q Thu-1800  . [START ON 10/21/2015] ferumoxytol  510 mg Intravenous Once  . furosemide  40 mg Oral Daily  . insulin aspart  0-20 Units Subcutaneous TID WC  . insulin aspart  0-5 Units Subcutaneous QHS  . insulin aspart protamine- aspart  15 Units Subcutaneous BID WC  . isosorbide-hydrALAZINE  1 tablet Oral TID  . loratadine  10 mg Oral Daily  . metoprolol tartrate  25 mg Oral BID  . protein supplement  1 scoop Oral TID WC  . sertraline  100 mg Oral Daily  . Warfarin - Pharmacist Dosing Inpatient   Does  not apply q1800   Infusions:    Assessment: 70 yo female with afib is currently on therapeutic coumadin.  INR today is 2.41.  Goal of Therapy:  INR 2-3 Monitor platelets by anticoagulation protocol: Yes   Plan:  - coumadin 5 mg po x1 - INR in am  Briana [name], Briana [name] 10/20/2015,8:07 AM

## 2015-10-20 NOTE — Progress Notes (Signed)
Physical Therapy Session Note  Patient Details  Name: Briana [name] MRN: [id number] Date of Birth: Jan 22, 1946  Today's Date: 10/20/2015 PT Individual Time: 1017-1107 PT Individual Time Calculation (min): 50 min   Short Term Goals: Week 1:  PT Short Term Goal 1 (Week 1): =LTGs due to ELOS  Skilled Therapeutic Interventions/Progress Updates:   Pt received in recliner with LE elevated but very edematous.  Awaiting appropriate sized TEDS from downstairs.  While awaiting TEDS and then while donning TEDS discussed with pt and daughter home set up, PLOF, equipment and assistance available at D/C and f/u therapy recommendations.  Pt verbalized agreement with HHPT.  Daughter has pictures of stairs at entry on her phone-3 large brick steps with L rail and stepping stones to access stairs.  Once TEDS donned and vitals assessed pt performed sit > stand and ambulated to gym with [facility] and min A for balance and to assist with 02.  In gym focused on picking object from floor, gait up/down ramp for inclines, across compliant mulch x 2 reps and simulated low car transfer with SPC and min A overall.  Pt given multiple seated rest breaks to assess Sp02 and HR all WFL.  Demonstrated safe stair negotiation sequence to daughter;pt performed up/down 4 stairs with L rail and SPC (switching hands as she descended) with close supervision-min A with pt beginning with step to sequence and changing to alternating sequence.  Returned to room and pt returned to recliner to elevate feet.  Alerted SW that pt would not need equipment but would benefit from HHPT at D/C and will likely need 02.  Therapy Documentation Precautions:  Precautions Precautions: Fall Precaution Comments: monitor HR an O2 sats Restrictions Weight Bearing Restrictions: No Vital Signs: Therapy Vitals Pulse Rate: (!) 51 BP: (!) 150/46 mmHg Patient Position (if appropriate): Sitting Oxygen Therapy SpO2: 92 % O2 Device: Nasal Cannula O2 Flow Rate  (L/min): 1 L/min Pulse Oximetry Type: Intermittent Pain: No c/o pain  See Function Navigator for Current Functional Status.   Therapy/Group: Individual Therapy  Raylene Everts [facility] 10/20/2015, 12:59 PM

## 2015-10-20 NOTE — Progress Notes (Signed)
[facility] PHYSICAL MEDICINE & REHABILITATION     PROGRESS NOTE  Subjective/Complaints:  Patient seen sitting up in bed this morning she has a mild nosebleed after blowing nose. She states that this is common for her. She states she had a good first in therapies and is sore this morning.  ROS: + Epistaxis. Denies CP, SOB, N/V/D.  Objective: Vital Signs: Blood pressure 168/53, pulse 52, temperature 97.9 F (36.6 C), temperature source Oral, resp. rate 18, height 5\' 4"  (1.626 m), weight 109.317 kg (241 lb), SpO2 94 %. No results found.  Recent Labs  10/18/15 0425 10/19/15 0444  WBC 7.6 7.6  HGB 7.9* 8.0*  HCT 26.4* 26.6*  PLT 203 199    Recent Labs  10/18/15 0425 10/19/15 0444  NA 139 141  K 4.4 4.6  CL 106 105  GLUCOSE 114* 138*  BUN 57* 54*  CREATININE 2.94* 2.69*  CALCIUM 8.6* 8.8*   CBG (last 3)   Recent Labs  10/18/15 2046 10/19/15 0637 10/19/15 1143  GLUCAP 183* 132* 111*    Wt Readings from Last 3 Encounters:  10/20/15 109.317 kg (241 lb)  10/18/15 109.3 kg (240 lb 15.4 oz)  09/25/15 102.513 kg (226 lb)    Physical Exam:  BP 168/53 mmHg  Pulse 52  Temp(Src) 97.9 F (36.6 C) (Oral)  Resp 18  Ht 5\' 4"  (1.626 m)  Wt 109.317 kg (241 lb)  BMI 41.35 kg/m2  SpO2 94% Constitutional: She appears well-developed and well-nourished. No distress.  HENT: Normocephalic and atraumatic.  Eyes: Conjunctivae and EOM are normal.   Cardiovascular: Normal rate.Murmur heard. Respiratory: Effort normal. No stridor. No respiratory distress. She has no wheezes.  GI: Soft. Bowel sounds are normal. She exhibits no distension. There is no tenderness.  Musculoskeletal: She exhibits mild edema. She exhibits no tenderness.  Neurological: She is alert and oriented.  Motor: B/l UE: 4+/5 proximal to distal B/l LE: 4+/5 proximal to distal  Skin: Neck wound healing. Skin is warm and dry. She is not diaphoretic. No erythema.  Psychiatric: She has a normal mood and affect.  Her behavior is normal. Judgment and thought content normal.   Assessment/Plan: 1. Functional deficits secondary to debility which require 3+ hours per day of interdisciplinary therapy in a comprehensive inpatient rehab setting. Physiatrist is providing close team supervision and 24 hour management of active medical problems listed below. Physiatrist and rehab team continue to assess barriers to discharge/monitor patient progress toward functional and medical goals.  Function:  Bathing Bathing position   Position: Wheelchair/chair at sink  Bathing parts Body parts bathed by patient: Right arm, Left arm, Chest, Abdomen, Front perineal area, Buttocks, Right upper leg, Left upper leg Body parts bathed by helper: Right lower leg, Left lower leg  Bathing assist Assist Level: Touching or steadying assistance(Pt > 75%)      Upper Body Dressing/Undressing Upper body dressing   What is the patient wearing?: Pull over shirt/dress     Pull over shirt/dress - Perfomed by patient: Thread/unthread right sleeve, Thread/unthread left sleeve, Put head through opening, Pull shirt over trunk          Upper body assist Assist Level: Set up   Set up : To obtain clothing/put away  Lower Body Dressing/Undressing Lower body dressing   What is the patient wearing?: Pants, Underwear Underwear - Performed by patient: Thread/unthread right underwear leg, Thread/unthread left underwear leg, Pull underwear up/down   Pants- Performed by patient: Thread/unthread right pants leg, Thread/unthread left pants  leg, Pull pants up/down                        Lower body assist Assist for lower body dressing: Touching or steadying assistance (Pt > 75%)      Toileting Toileting   Toileting steps completed by patient: Adjust clothing prior to toileting, Performs perineal hygiene, Adjust clothing after toileting Toileting steps completed by helper: Adjust clothing prior to toileting, Adjust clothing after  toileting Toileting Assistive Devices: Grab bar or rail  Toileting assist Assist level: Touching or steadying assistance (Pt.75%)   Transfers Chair/bed transfer   Chair/bed transfer method: Ambulatory Chair/bed transfer assist level: Touching or steadying assistance (Pt > 75%) Chair/bed transfer assistive device: Armrests, Cane     Locomotion Ambulation     Max distance: 150 Assist level: Touching or steadying assistance (Pt > 75%)   Wheelchair   Type: Manual Max wheelchair distance: 150 Assist Level: Supervision or verbal cues  Cognition Comprehension Comprehension assist level: Follows complex conversation/direction with no assist  Expression Expression assist level: Expresses complex ideas: With extra time/assistive device  Social Interaction Social Interaction assist level: Interacts appropriately 90% of the time - Needs monitoring or encouragement for participation or interaction.  Problem Solving Problem solving assist level: Solves complex 90% of the time/cues < 10% of the time  Memory Memory assist level: Recognizes or recalls 90% of the time/requires cueing < 10% of the time    Medical Problem List and Plan: 1. Abnormality of gait secondary to debility  Continue CIR 2. DVT Prophylaxis/Anticoagulation: Pharmaceutical: Coumadin 3. Pain Management: Hydrocodone prn for shoulder/wrist pain  4. Mood: Stable on Zoloft 100 mg daily. LCSW to follow for evaluation and support.  5. Neuropsych: This patient is capable of making decisions on her own behalf. 6. Skin/Wound Care: Routine care, avoid prolonged pressure and shearing.  Neck wound healing, cont dressing 7. Fluids/Electrolytes/Nutrition: Monitor I/O.   8. NSTEMI/Hx of CAD: Treated medically. On Metoprolol, lipitor, ASA and Bidil 9. Afib with RVR: Monitor HR. Continue amiodarone, metoprolol and coumadin  10. DM type 2: Monitor BS ac/hs. Continue 70/30 insulin bid. Will use SSI for elevated BS.   Will monitor with  increased activity and consider adjustments as necessary, Relatively controlled 11. Acute on chronic renal failure:   Continue IV lasix bid per nephrology, Cr slowly improving.   Follow recs per Nephro 12. HTN: Monitor BP   Continue Norvasc, furosemide, lopressor  Bidil increased to 2 tabs on 5/26   Will monitor with increased activity 13. ABLA  Hb 8.0 on 5/25 (stable)  Will continue to monitor 14. Hypoalbuminemia  Protein supplement started on 5/25 15. Epistaxis  Chronic, control local pressure  LOS (Days) 2 A FACE TO FACE EVALUATION WAS PERFORMED  Jnya Brossard Lorie Phenix 10/20/2015 8:43 AM

## 2015-10-20 NOTE — Progress Notes (Signed)
Occupational Therapy Session Note  Patient Details  Name: Briana [name] MRN: [id number] Date of Birth: Jan 17, 1946  Today's Date: 10/20/2015 OT Individual Time:  -    [phone]     (60 min) 1st session   1400-1530     (41 min)   2nd session   Short Term Goals: Week 1:  OT Short Term Goal 1 (Week 1): STGS= LTGS SECONDARY TO SHORT ESTIMATED LOS      Skilled Therapeutic Interventions/Progress Updates:    1st session: Pt ambulated with closes supervision to bathroom with 1 liter oxygen.  Pt engaged in bathing and dressing at shower level.  Ppt ambulated to shower and has TT bench with grab bars, HH   shower head,  Pt was supervision with transfer to shower.  Removed oxygen when in shower.  Oxygen maintained at  90 % during session.  Pt. Performed sit to stand with SBA.  Ppt had no LOB during session and remained safe throughout session.  Provided Ted stockings for pt to start wearing.  Pt has much edema in feet and legs   2nd session:  Pt ambulated to kitchen.  Engaged in simple meal prep, clean up, bed making, and problem solved with carrying laundry.  Pt. Engaged in unloading dishwasher, and preparing simple jello for food.  She had minimal LOB when carrying food items, but had good balance reaction.  Pt stated she does better in shoes but with edema she has none that fit.    Instructed pt on taking slow careful steps and passing items, sliding items if possible to increase safety.  Ppt made bed at mod I.    Laundry:  Discussed carrying clothes to washer as she removed items and not carrying a large basket.  Ppt reported she had worked this out when she underwent her cardiac problems.  Pt engaged in 20 minutes of activity before taking rest break.  She ambulated back to room and remained in recliner at end of session  Therapy Documentation Precautions:  Precautions Precautions: Fall Precaution Comments: monitor HR an O2 sats Restrictions Weight Bearing Restrictions: No   Pain:  none         See Function Navigator for Current Functional Status.   Therapy/Group: Individual Therapy  Lisa Roca 10/20/2015, 3:26 PM

## 2015-10-20 NOTE — Progress Notes (Signed)
Inpatient [facility] Individual Statement of Services  Patient Name:  HAIVYN BUCHWALD  Date:  10/20/2015  Welcome to the [facility].  Our goal is to provide you with an individualized program based on your diagnosis and situation, designed to meet your specific needs.  With this comprehensive rehabilitation program, you will be expected to participate in at least 3 hours of rehabilitation therapies Monday-Friday, with modified therapy programming on the weekends.  Your rehabilitation program will include the following services:  Physical Therapy (PT), Occupational Therapy (OT), 24 hour per day rehabilitation nursing, Case Management (Social Worker), Rehabilitation Medicine, Nutrition Services and Pharmacy Services  Weekly team conferences will be held on Wednesdays to discuss your progress.  Your Social Worker will talk with you frequently to get your input and to update you on team discussions.  Team conferences with you and your family in attendance may also be held.  Expected length of stay:  5 to 7 days  Overall anticipated outcome:  Modified Independent  Depending on your progress and recovery, your program may change. Your Social Worker will coordinate services and will keep you informed of any changes. Your Social Worker's name and contact numbers are listed  below.  The following services may also be recommended but are not provided by the [facility] will be made to provide these services after discharge if needed.  Arrangements include referral to agencies that provide these services.  Your insurance has been verified to be:  Medicare and [place] Your primary doctor is:  Dr. Neysa Hotter  Pertinent information will be shared with your doctor and your insurance company.  Social Worker:  Alfonse Alpers, LCSW  [phone] or ([phone]  Information discussed with and copy given to patient by: Trey Sailors, 10/20/2015, 10:52 AM

## 2015-10-21 ENCOUNTER — Inpatient Hospital Stay (HOSPITAL_COMMUNITY): Payer: Medicare Other | Admitting: Physical Therapy

## 2015-10-21 ENCOUNTER — Inpatient Hospital Stay (HOSPITAL_COMMUNITY): Payer: Medicare Other | Admitting: *Deleted

## 2015-10-21 ENCOUNTER — Inpatient Hospital Stay (HOSPITAL_COMMUNITY): Payer: Medicare Other | Admitting: Occupational Therapy

## 2015-10-21 LAB — PROTIME-INR
INR: 2.28 — ABNORMAL HIGH (ref 0.00–1.49)
PROTHROMBIN TIME: 24.9 s — AB (ref 11.6–15.2)

## 2015-10-21 MED ORDER — SODIUM CHLORIDE 0.9 % IV SOLN
510.0000 mg | Freq: Once | INTRAVENOUS | Status: AC
  Administered 2015-10-21: 510 mg via INTRAVENOUS
  Filled 2015-10-21: qty 17

## 2015-10-21 MED ORDER — WARFARIN SODIUM 5 MG PO TABS
5.0000 mg | ORAL_TABLET | Freq: Once | ORAL | Status: AC
  Administered 2015-10-21: 5 mg via ORAL
  Filled 2015-10-21: qty 1

## 2015-10-21 NOTE — Progress Notes (Signed)
[facility] PHYSICAL MEDICINE & REHABILITATION     PROGRESS NOTE  Subjective/Complaints:   No problems overnite, pt has LLE.RLE swelling  ROS: + Epistaxis. Denies CP, SOB, N/V/D.  Objective: Vital Signs: Blood pressure 143/67, pulse 54, temperature 99.2 F (37.3 C), temperature source Oral, resp. rate 18, height 5\' 4"  (1.626 m), weight 109.317 kg (241 lb), SpO2 92 %. No results found.  Recent Labs  10/19/15 0444  WBC 7.6  HGB 8.0*  HCT 26.6*  PLT 199    Recent Labs  10/19/15 0444  NA 141  K 4.6  CL 105  GLUCOSE 138*  BUN 54*  CREATININE 2.69*  CALCIUM 8.8*   CBG (last 3)   Recent Labs  10/19/15 2106 10/19/15 2136 10/20/15 0645  GLUCAP 60* 87 119*    Wt Readings from Last 3 Encounters:  10/20/15 109.317 kg (241 lb)  10/18/15 109.3 kg (240 lb 15.4 oz)  09/25/15 102.513 kg (226 lb)    Physical Exam:  BP 143/67 mmHg  Pulse 54  Temp(Src) 99.2 F (37.3 C) (Oral)  Resp 18  Ht 5\' 4"  (1.626 m)  Wt 109.317 kg (241 lb)  BMI 41.35 kg/m2  SpO2 92% Constitutional: She appears well-developed and well-nourished. No distress.  HENT: Normocephalic and atraumatic.  Eyes: Conjunctivae and EOM are normal.   Cardiovascular: Normal rate.Murmur heard. Respiratory: Effort normal. No stridor. No respiratory distress. She has no wheezes.  GI: Soft. Bowel sounds are normal. She exhibits no distension. There is no tenderness.  Musculoskeletal: She exhibits mild edema. She exhibits no tenderness.  Neurological: She is alert and oriented.  Motor: B/l UE: 4+/5 proximal to distal B/l LE: 4+/5 proximal to distal  Skin: Neck wound healing. Skin is warm and dry. She is not diaphoretic. No erythema.  Psychiatric: She has a normal mood and affect. Her behavior is normal. Judgment and thought content normal.   Assessment/Plan: 1. Functional deficits secondary to debility which require 3+ hours per day of interdisciplinary therapy in a comprehensive inpatient rehab  setting. Physiatrist is providing close team supervision and 24 hour management of active medical problems listed below. Physiatrist and rehab team continue to assess barriers to discharge/monitor patient progress toward functional and medical goals.  Function:  Bathing Bathing position   Position: Shower  Bathing parts Body parts bathed by patient: Right arm, Left arm, Chest, Abdomen, Front perineal area, Buttocks, Right upper leg, Left upper leg, Right lower leg, Left lower leg, Back Body parts bathed by helper: Right lower leg, Left lower leg  Bathing assist Assist Level: Set up      Upper Body Dressing/Undressing Upper body dressing   What is the patient wearing?: Pull over shirt/dress     Pull over shirt/dress - Perfomed by patient: Thread/unthread right sleeve, Thread/unthread left sleeve, Put head through opening, Pull shirt over trunk          Upper body assist Assist Level: Set up   Set up : To obtain clothing/put away  Lower Body Dressing/Undressing Lower body dressing   What is the patient wearing?: Pants, Underwear Underwear - Performed by patient: Thread/unthread right underwear leg, Thread/unthread left underwear leg, Pull underwear up/down   Pants- Performed by patient: Thread/unthread right pants leg, Thread/unthread left pants leg, Pull pants up/down                        Lower body assist Assist for lower body dressing: Supervision or verbal cues  Toileting Toileting   Toileting steps completed by patient: Adjust clothing prior to toileting, Performs perineal hygiene, Adjust clothing after toileting Toileting steps completed by helper: Adjust clothing prior to toileting, Adjust clothing after toileting Toileting Assistive Devices: Grab bar or rail  Toileting assist Assist level: More than reasonable time   Transfers Chair/bed transfer   Chair/bed transfer method: Ambulatory Chair/bed transfer assist level: Supervision or verbal  cues Chair/bed transfer assistive device: Librarian, academic     Max distance: 150 Assist level: Touching or steadying assistance (Pt > 75%)   Wheelchair   Type: Manual Max wheelchair distance: 150 Assist Level: Supervision or verbal cues  Cognition Comprehension Comprehension assist level: Follows complex conversation/direction with extra time/assistive device  Expression Expression assist level: Expresses complex ideas: With extra time/assistive device  Social Interaction Social Interaction assist level: Interacts appropriately 90% of the time - Needs monitoring or encouragement for participation or interaction.  Problem Solving Problem solving assist level: Solves complex 90% of the time/cues < 10% of the time  Memory Memory assist level: Recognizes or recalls 90% of the time/requires cueing < 10% of the time    Medical Problem List and Plan: 1. Abnormality of gait secondary to debility  Continue CIR 2. DVT Prophylaxis/Anticoagulation: Pharmaceutical: Coumadin 3. Pain Management: Hydrocodone prn for shoulder/wrist pain  4. Mood: Stable on Zoloft 100 mg daily. LCSW to follow for evaluation and support.  5. Neuropsych: This patient is capable of making decisions on her own behalf. 6. Skin/Wound Care: Routine care, avoid prolonged pressure and shearing.  Neck wound healing, cont dressing 7. Fluids/Electrolytes/Nutrition: Monitor I/O.   8. NSTEMI/Hx of CAD: Treated medically. On Metoprolol, lipitor, ASA and Bidil 9. Afib with RVR: Monitor HR. Continue amiodarone, metoprolol and coumadin  10. DM type 2: Monitor BS ac/hs. Continue 70/30 insulin bid. Will use SSI for elevated BS.   Will monitor with increased activity and consider adjustments as necessary, Relatively controlled CBG (last 3)   Recent Labs  10/19/15 2106 10/19/15 2136 10/20/15 0645  GLUCAP 60* 87 119*    11. Acute on chronic renal failure:   Continue IV lasix bid per nephrology, Cr slowly  improving.   Follow recs per Nephro 12. HTN: Monitor BP   Continue Norvasc, furosemide, lopressor  Bidil increased to 2 tabs on 5/26   Will monitor with increased activity Filed Vitals:   10/20/15 2033 10/21/15 0500  BP: 166/39 143/67  Pulse: 63 54  Temp:  99.2 F (37.3 C)  Resp:  18   13. ABLA  Hb 8.0 on 5/25 (stable)  Will continue to monitor 14. Hypoalbuminemia  Protein supplement started on 5/25 15. Epistaxis  Chronic, control local pressure 16.  LE edema ECHO has normal ej fx, likely from renal issues, pt feels it has improved, cont TEDs  LOS (Days) 3 A FACE TO FACE EVALUATION WAS PERFORMED  Charlett Blake 10/21/2015 6:19 AM

## 2015-10-21 NOTE — Progress Notes (Signed)
Occupational Therapy Session Note  Patient Details  Name: Briana [name] MRN: [id number] Date of Birth: 1945/09/08  Today's Date: 10/21/2015 OT Individual Time:  - 0915-1015  (60 min)      Short Term Goals: Week 1:  OT Short Term Goal 1 (Week 1): STGS= LTGS SECONDARY TO SHORT ESTIMATED LOS      Skilled Therapeutic Interventions/Progress Updates:    OT addressed basic ADLtraining at sink.  .  Addressed functional transfers, bed mobility, standing balance,   Pt ambulated to sinke and engaged in standing and sitting balance activities while bathing.  She completed bath with no loB.  Pt donned shoes and rest of clothes with help with TEDs.  Donned regular shoes and ambulated to gym and practiced balance activities using TAI CHi movements.  Pt had difficulty with slow stepping movements and tended to go fast and out of control.  .  Ambulated back to room and left with all needs in reach.    Therapy Documentation Precautions:  Precautions Precautions: Fall Precaution Comments: monitor HR an O2 sats Restrictions Weight Bearing Restrictions: No General:   Vital Signs: Therapy Vitals Temp: 99.2 F (37.3 C) Temp Source: Oral Pulse Rate: (!) 54 Resp: 18 BP: (!) 143/67 mmHg Patient Position (if appropriate): Lying Oxygen Therapy SpO2: 92 % O2 Device: Nasal Cannula O2 Flow Rate (L/min): 1 L/min Pain: Pain Assessment Pain Score: 2      See Function Navigator for Current Functional Status.   Therapy/Group: Individual Therapy  Lisa Roca 10/21/2015, 7:52 AM

## 2015-10-21 NOTE — Progress Notes (Signed)
ANTICOAGULATION CONSULT NOTE - Follow Up Consult  Pharmacy Consult for coumadin Indication: atrial fibrillation  Allergies  Allergen Reactions  . Carbamazepine Other (See Comments)    Reaction to tegretol - loopy  . Clonazepam Other (See Comments)    Unknown reaction  . Gemfibrozil Other (See Comments)    Patient is not aware of this allergy but states that she has side effects to a cholesterol medication in the past  . Macrobid [Nitrofurantoin] Nausea And Vomiting  . Oxycodone Other (See Comments)    hallucinations    Patient Measurements: Height: 5\' 4"  (162.6 cm) Weight: 240 lb 9.6 oz (109.135 kg) IBW/kg (Calculated) : 54.7 Heparin Dosing Weight:   Vital Signs: Temp: 99.2 F (37.3 C) (05/27 0500) Temp Source: Oral (05/27 0500) BP: 163/50 mmHg (05/27 0836) Pulse Rate: 55 (05/27 0836)  Labs:  Recent Labs  10/19/15 0444 10/20/15 0616 10/21/15 0422  HGB 8.0*  --   --   HCT 26.6*  --   --   PLT 199  --   --   LABPROT 27.3* 26.0* 24.9*  INR 2.58* 2.41* 2.28*  CREATININE 2.69*  --   --     Estimated Creatinine Clearance: 23.5 mL/min (by C-G formula based on Cr of 2.69).   Medications:  Scheduled:  . amiodarone  200 mg Oral Daily  . amLODipine  10 mg Oral Daily  . aspirin EC  81 mg Oral Daily  . atorvastatin  80 mg Oral QPM  . calcium acetate  1,334 mg Oral TID WC  . darbepoetin (ARANESP) injection - NON-DIALYSIS  100 mcg Subcutaneous Q Thu-1800  . ferumoxytol  510 mg Intravenous Once  . furosemide  40 mg Oral Daily  . insulin aspart  0-20 Units Subcutaneous TID WC  . insulin aspart  0-5 Units Subcutaneous QHS  . insulin aspart protamine- aspart  15 Units Subcutaneous BID WC  . isosorbide-hydrALAZINE  2 tablet Oral TID  . loratadine  10 mg Oral Daily  . metoprolol tartrate  25 mg Oral BID  . protein supplement  1 scoop Oral TID WC  . sertraline  100 mg Oral Daily  . Warfarin - Pharmacist Dosing Inpatient   Does not apply q1800   Infusions:     Assessment: 70 yo female with afib is currently on therapeutic coumadin.  INR today is 2.28  Goal of Therapy:  INR 2-3 Monitor platelets by anticoagulation protocol: Yes   Plan:  - Warfarin po 5 mg x 1 tonight - Daily INR  Angela Burke, PharmD Pharmacy Resident Pager: [phone] 10/21/2015,10:12 AM

## 2015-10-21 NOTE — Progress Notes (Signed)
Physical Therapy Session Note  Patient Details  Name: Briana [name] MRN: [id number] Date of Birth: 1945/10/26  Today's Date: 10/21/2015 PT Individual Time: 1415-1530 PT Individual Time Calculation (min): 75 min     Skilled Therapeutic Interventions/Progress Updates:  Patient in bed agrees to therapy after increased encouragement, started session with mobility in room and bathroom, min A to assist in navigating with O2 tank and IV, before leaving the room Rn d/c IV. Session focused on short bouts of ambulation with seated rest breaks. W/c mobility with assistance around hospital and outside. Patient enjoyed being outside and increased her participation.  Bed exercises for B LE in order to increase strength.  Burger-[place] exercises in bed in order to improve circulation 3 x 1 min .  At the end of session ,patient left in bed with all needs within reach.   Therapy Documentation Precautions:  Precautions Precautions: Fall Precaution Comments: monitor HR an O2 sats Restrictions Weight Bearing Restrictions: No   See Function Navigator for Current Functional Status.   Therapy/Group: Individual Therapy  Guadlupe Spanish 10/21/2015, 3:48 PM

## 2015-10-21 NOTE — Progress Notes (Signed)
Physical Therapy Session Note  Patient Details  Name: GEANNIE POLKOWSKI MRN: [id number] Date of Birth: June 10, 1945  Today's Date: 10/21/2015 PT Individual Time: 1030-1130 PT Individual Time Calculation (min): 60 min   Short Term Goals: Week 1:  PT Short Term Goal 1 (Week 1): =LTGs due to ELOS  Skilled Therapeutic Interventions/Progress Updates:  Pt performed all sit to stand transfers with S and spc. Pt propelled w/c about 150 feet with B UE and LEs with multiple rest breaks and S. Pt performed cone taps and alternating con taps, 3 sets x 10 reps each for LE strengthening and balance. Pt ambulated 150 feet with spc and S. Pt returned to room following treatment and left sitting up in recliner with call bell within reach.   Therapy Documentation Precautions:  Precautions Precautions: Fall Precaution Comments: monitor HR an O2 sats Restrictions Weight Bearing Restrictions: No General:   Pain: No c/o pain.   See Function Navigator for Current Functional Status.   Therapy/Group: Individual Therapy  Dub Amis 10/21/2015, 12:47 PM

## 2015-10-22 ENCOUNTER — Inpatient Hospital Stay (HOSPITAL_COMMUNITY): Payer: Medicare Other | Admitting: Physical Therapy

## 2015-10-22 LAB — PROTIME-INR
INR: 2.25 — ABNORMAL HIGH (ref 0.00–1.49)
Prothrombin Time: 24.7 seconds — ABNORMAL HIGH (ref 11.6–15.2)

## 2015-10-22 MED ORDER — WARFARIN SODIUM 5 MG PO TABS
5.0000 mg | ORAL_TABLET | Freq: Once | ORAL | Status: AC
  Administered 2015-10-22: 5 mg via ORAL
  Filled 2015-10-22: qty 1

## 2015-10-22 NOTE — Progress Notes (Signed)
Physical Therapy Session Note  Patient Details  Name: ALGA NISSAN MRN: [id number] Date of Birth: 08-08-1945  Today's Date: 10/22/2015 PT Individual Time: 1345-1415 PT Individual Time Calculation (min): 30 min   Short Term Goals: Week 1:  PT Short Term Goal 1 (Week 1): =LTGs due to ELOS  Skilled Therapeutic Interventions/Progress Updates:  Pt was seen bedside in the pm. Pt performed all sit to stand transfers with S. Pt performed toilet transfers with S to min guard. Pt ambulated 150 feet x 2 pushing w/c with S and slow cadence. Pt ambulated 10 feet x 2 with straight cane and S. Notified by pt's nurse O2 decreased to 1 liter. O2 following ambulation decreased to 87 to 88%, pt recovered to greater than 92% after about 90 seconds. Notified pt's nurse of above. Pt returned to room following treatment and left sitting up in bedside recliner with call bell within reach.   Therapy Documentation Precautions:  Precautions Precautions: Fall Precaution Comments: monitor HR an O2 sats Restrictions Weight Bearing Restrictions: No General:   Pain: No c/o pain, B knees sore.   See Function Navigator for Current Functional Status.   Therapy/Group: Individual Therapy  Dub Amis 10/22/2015, 2:45 PM

## 2015-10-22 NOTE — Progress Notes (Signed)
[facility] PHYSICAL MEDICINE & REHABILITATION     PROGRESS NOTE  Subjective/Complaints:   Pt asking about D/C date, discussed LE edema reduction tech , has legs hanging down no TEDs on  ROS: + Epistaxis. Denies CP, SOB, N/V/D.  Objective: Vital Signs: Blood pressure 170/50, pulse 52, temperature 98.2 F (36.8 C), temperature source Oral, resp. rate 18, height 5\' 4"  (1.626 m), weight 109.362 kg (241 lb 1.6 oz), SpO2 94 %. No results found. No results for input(s): WBC, HGB, HCT, PLT in the last 72 hours. No results for input(s): NA, K, CL, GLUCOSE, BUN, CREATININE, CALCIUM in the last 72 hours.  Invalid input(s): CO CBG (last 3)   Recent Labs  10/19/15 2106 10/19/15 2136 10/20/15 0645  GLUCAP 60* 87 119*    Wt Readings from Last 3 Encounters:  10/22/15 109.362 kg (241 lb 1.6 oz)  10/18/15 109.3 kg (240 lb 15.4 oz)  09/25/15 102.513 kg (226 lb)    Physical Exam:  BP 170/50 mmHg  Pulse 52  Temp(Src) 98.2 F (36.8 C) (Oral)  Resp 18  Ht 5\' 4"  (1.626 m)  Wt 109.362 kg (241 lb 1.6 oz)  BMI 41.36 kg/m2  SpO2 94% Constitutional: She appears well-developed and well-nourished. No distress.  HENT: Normocephalic and atraumatic.  Eyes: Conjunctivae and EOM are normal.   Cardiovascular: bradycardia.Murmur heard. Respiratory: Effort normal. No stridor. No respiratory distress. She has no wheezes.  GI: Soft. Bowel sounds are normal. She exhibits no distension. There is no tenderness.  Musculoskeletal: She exhibits mild edema. She exhibits no tenderness.  Neurological: She is alert and oriented.  Motor: B/l UE: 4+/5 proximal to distal B/l LE: 4+/5 proximal to distal  Skin: Neck wound healing. Skin is warm and dry. She is not diaphoretic. No erythema.  Psychiatric: She has a normal mood and affect. Her behavior is normal. Judgment and thought content normal.   Assessment/Plan: 1. Functional deficits secondary to debility which require 3+ hours per day of interdisciplinary  therapy in a comprehensive inpatient rehab setting. Physiatrist is providing close team supervision and 24 hour management of active medical problems listed below. Physiatrist and rehab team continue to assess barriers to discharge/monitor patient progress toward functional and medical goals.  Function:  Bathing Bathing position   Position: Wheelchair/chair at sink  Bathing parts Body parts bathed by patient: Right arm, Left arm, Chest, Abdomen, Front perineal area, Buttocks, Right upper leg, Left upper leg, Right lower leg, Left lower leg, Back Body parts bathed by helper: Right lower leg, Left lower leg  Bathing assist Assist Level: Set up      Upper Body Dressing/Undressing Upper body dressing   What is the patient wearing?: Pull over shirt/dress     Pull over shirt/dress - Perfomed by patient: Thread/unthread right sleeve, Thread/unthread left sleeve, Put head through opening, Pull shirt over trunk          Upper body assist Assist Level: Set up   Set up : To obtain clothing/put away  Lower Body Dressing/Undressing Lower body dressing   What is the patient wearing?: Pants, Underwear Underwear - Performed by patient: Thread/unthread right underwear leg, Thread/unthread left underwear leg, Pull underwear up/down   Pants- Performed by patient: Thread/unthread right pants leg, Thread/unthread left pants leg, Pull pants up/down                        Lower body assist Assist for lower body dressing: Supervision or verbal cues  Toileting Toileting   Toileting steps completed by patient: Performs perineal hygiene Toileting steps completed by helper: Adjust clothing prior to toileting, Adjust clothing after toileting Toileting Assistive Devices: Grab bar or rail  Toileting assist Assist level: Touching or steadying assistance (Pt.75%)   Transfers Chair/bed transfer   Chair/bed transfer method: Ambulatory Chair/bed transfer assist level: Touching or steadying  assistance (Pt > 75%) Chair/bed transfer assistive device: Librarian, academic     Max distance: 150 Assist level: Supervision or verbal cues   Wheelchair   Type: Manual Max wheelchair distance: 150 Assist Level: Supervision or verbal cues  Cognition Comprehension Comprehension assist level: Follows complex conversation/direction with no assist  Expression Expression assist level: Expresses complex ideas: With extra time/assistive device  Social Interaction Social Interaction assist level: Interacts appropriately 90% of the time - Needs monitoring or encouragement for participation or interaction.  Problem Solving Problem solving assist level: Solves complex 90% of the time/cues < 10% of the time  Memory Memory assist level: Recognizes or recalls 90% of the time/requires cueing < 10% of the time    Medical Problem List and Plan: 1. Abnormality of gait secondary to debility  Continue CIR PT, OT 2. DVT Prophylaxis/Anticoagulation: Pharmaceutical: Coumadin 3. Pain Management: Hydrocodone prn for shoulder/wrist pain  4. Mood: Stable on Zoloft 100 mg daily. LCSW to follow for evaluation and support.  5. Neuropsych: This patient is capable of making decisions on her own behalf. 6. Skin/Wound Care: Routine care, avoid prolonged pressure and shearing.  Neck wound healing, cont dressing 7. Fluids/Electrolytes/Nutrition: Monitor I/O.   8. NSTEMI/Hx of CAD: Treated medically. On Metoprolol, lipitor, ASA and Bidil 9. Afib with RVR: Monitor HR. Continue amiodarone, metoprolol and coumadin  10. DM type 2: Monitor BS ac/hs. Continue 70/30 insulin bid. Will use SSI for elevated BS. Low evening CBG yest but generally running OK  Will monitor with increased activity and consider adjustments as necessary, Relatively controlled CBG (last 3)   Recent Labs  10/19/15 2106 10/19/15 2136 10/20/15 0645  GLUCAP 60* 87 119*    11. Acute on chronic renal failure:   Continue po  lasix bid per nephrology, Cr slowly improving.   Follow recs per Nephro 12. HTN: Monitor BP   Continue Norvasc, furosemide, lopressor  Bidil increased to 2 tabs on 5/26   Will monitor with increased activity Filed Vitals:   10/21/15 2114 10/22/15 0646  BP: 177/49 170/50  Pulse:  52  Temp:  98.2 F (36.8 C)  Resp:  18   13. ABLA  Hb 8.0 on 5/25 (stable)  Also with Fe deficiency s/p ferraheme 1 x dose, may d/c IV 14. Hypoalbuminemia  Protein supplement started on 5/25 15. Epistaxis  Chronic, control local pressure 16.  LE edema ECHO has normal ej fx, likely from renal issues, pt feels it has improved, cont TEDs  LOS (Days) 4 A FACE TO FACE EVALUATION WAS PERFORMED  Cendy Oconnor E 10/22/2015 10:12 AM

## 2015-10-22 NOTE — Progress Notes (Signed)
ANTICOAGULATION CONSULT NOTE - Follow Up Consult  Pharmacy Consult for coumadin Indication: atrial fibrillation  Allergies  Allergen Reactions  . Carbamazepine Other (See Comments)    Reaction to tegretol - loopy  . Clonazepam Other (See Comments)    Unknown reaction  . Gemfibrozil Other (See Comments)    Patient is not aware of this allergy but states that she has side effects to a cholesterol medication in the past  . Macrobid [Nitrofurantoin] Nausea And Vomiting  . Oxycodone Other (See Comments)    hallucinations    Patient Measurements: Height: 5\' 4"  (162.6 cm) Weight: 241 lb 1.6 oz (109.362 kg) IBW/kg (Calculated) : 54.7 Heparin Dosing Weight:   Vital Signs: Temp: 98.2 F (36.8 C) (05/28 0646) Temp Source: Oral (05/28 0646) BP: 170/50 mmHg (05/28 0646) Pulse Rate: 52 (05/28 0646)  Labs:  Recent Labs  10/20/15 0616 10/21/15 0422 10/22/15 0702  LABPROT 26.0* 24.9* 24.7*  INR 2.41* 2.28* 2.25*    Estimated Creatinine Clearance: 23.5 mL/min (by C-G formula based on Cr of 2.69).   Medications:  Scheduled:  . amiodarone  200 mg Oral Daily  . amLODipine  10 mg Oral Daily  . aspirin EC  81 mg Oral Daily  . atorvastatin  80 mg Oral QPM  . calcium acetate  1,334 mg Oral TID WC  . darbepoetin (ARANESP) injection - NON-DIALYSIS  100 mcg Subcutaneous Q Thu-1800  . furosemide  40 mg Oral Daily  . insulin aspart  0-20 Units Subcutaneous TID WC  . insulin aspart  0-5 Units Subcutaneous QHS  . insulin aspart protamine- aspart  15 Units Subcutaneous BID WC  . isosorbide-hydrALAZINE  2 tablet Oral TID  . loratadine  10 mg Oral Daily  . metoprolol tartrate  25 mg Oral BID  . protein supplement  1 scoop Oral TID WC  . sertraline  100 mg Oral Daily  . Warfarin - Pharmacist Dosing Inpatient   Does not apply q1800   Infusions:    Assessment: 70 yo female with afib is currently on therapeutic coumadin.  INR today is 2.25  Goal of Therapy:  INR 2-3 Monitor platelets  by anticoagulation protocol: Yes   Plan:  - Warfarin po 5 mg x 1 tonight - Daily INR  Angela Burke, PharmD Pharmacy Resident Pager: [phone] 10/22/2015,10:31 AM

## 2015-10-23 ENCOUNTER — Encounter (HOSPITAL_COMMUNITY): Payer: Medicare Other

## 2015-10-23 ENCOUNTER — Inpatient Hospital Stay (HOSPITAL_COMMUNITY): Payer: Medicare Other | Admitting: Occupational Therapy

## 2015-10-23 ENCOUNTER — Inpatient Hospital Stay (HOSPITAL_COMMUNITY): Payer: Medicare Other | Admitting: Physical Therapy

## 2015-10-23 DIAGNOSIS — F329 Major depressive disorder, single episode, unspecified: Secondary | ICD-10-CM

## 2015-10-23 LAB — CBC WITH DIFFERENTIAL/PLATELET
BASOS ABS: 0.1 10*3/uL (ref 0.0–0.1)
Basophils Relative: 1 %
Eosinophils Absolute: 0.2 10*3/uL (ref 0.0–0.7)
Eosinophils Relative: 4 %
HEMATOCRIT: 26.9 % — AB (ref 36.0–46.0)
Hemoglobin: 8 g/dL — ABNORMAL LOW (ref 12.0–15.0)
LYMPHS ABS: 1.2 10*3/uL (ref 0.7–4.0)
LYMPHS PCT: 20 %
MCH: 27 pg (ref 26.0–34.0)
MCHC: 29.7 g/dL — ABNORMAL LOW (ref 30.0–36.0)
MCV: 90.9 fL (ref 78.0–100.0)
Monocytes Absolute: 0.5 10*3/uL (ref 0.1–1.0)
Monocytes Relative: 8 %
NEUTROS ABS: 4.2 10*3/uL (ref 1.7–7.7)
Neutrophils Relative %: 67 %
Platelets: 185 10*3/uL (ref 150–400)
RBC: 2.96 MIL/uL — AB (ref 3.87–5.11)
RDW: 16.1 % — ABNORMAL HIGH (ref 11.5–15.5)
WBC: 6.3 10*3/uL (ref 4.0–10.5)

## 2015-10-23 LAB — BASIC METABOLIC PANEL
ANION GAP: 8 (ref 5–15)
BUN: 60 mg/dL — ABNORMAL HIGH (ref 6–20)
CHLORIDE: 106 mmol/L (ref 101–111)
CO2: 25 mmol/L (ref 22–32)
Calcium: 9 mg/dL (ref 8.9–10.3)
Creatinine, Ser: 2.75 mg/dL — ABNORMAL HIGH (ref 0.44–1.00)
GFR calc Af Amer: 19 mL/min — ABNORMAL LOW (ref >60)
GFR, EST NON AFRICAN AMERICAN: 16 mL/min — AB (ref >60)
GLUCOSE: 175 mg/dL — AB (ref 65–99)
POTASSIUM: 4.7 mmol/L (ref 3.5–5.1)
SODIUM: 139 mmol/L (ref 135–145)

## 2015-10-23 LAB — PROTIME-INR
INR: 3 — AB (ref 0.00–1.49)
Prothrombin Time: 30.6 seconds — ABNORMAL HIGH (ref 11.6–15.2)

## 2015-10-23 LAB — GLUCOSE, CAPILLARY: Glucose-Capillary: 107 mg/dL — ABNORMAL HIGH (ref 65–99)

## 2015-10-23 LAB — OCCULT BLOOD X 1 CARD TO LAB, STOOL: Fecal Occult Bld: NEGATIVE

## 2015-10-23 NOTE — Progress Notes (Signed)
Occupational Therapy Session Note  Patient Details  Name: Briana [name] MRN: [id number] Date of Birth: 27-Feb-1946  Today's Date: 10/23/2015 OT Individual Time: 1103-1203 OT Individual Time Calculation (min): 60 min    Short Term Goals: Week 1:  OT Short Term Goal 1 (Week 1): STGS= LTGS SECONDARY TO SHORT ESTIMATED LOS  Skilled Therapeutic Interventions/Progress Updates:    Treatment session with focus on dynamic balance and home management tasks while increasing activity tolerance.  Pt declined bathing and dressing tasks but willing to participate in home management tasks.  Ambulated with SPC to ADL kitchen at overall Mod I level, except for therapist managing oxygen tank.  Oxygen level 91% on 1L O2 after ambulation to kitchen approx 100 feet.  Educated on breathing techniques to increase endurance and oxygen levels to progress towards weaning from supplemental O2.  Engaged in meal prep with frying an egg in standing while on room air, pt able to gather all items from cabinets and refrigerator without assist.  82% on room air after meal prep with pt reporting no sensation of shortness of breath.  Seated rest break with O2 applied before returning to wash dishes and clean up post meal prep.  Again removed O2 to complete task on room air with O2 dropping to 86% while washing dishes and then returning to 90 on room air after 90 seconds and cues for breathing technique as well.  Discussed laundry task and pt completed simulated laundry with moving towels from washing machine to dryer, removing towels from dryer, and folding them in standing.  Educated on energy conservation strategies to incorporate as needed to increase activity tolerance and endurance.  Ambulated >150 feet back to room while pushing w/c for increased stability and activity tolerance.    Therapy Documentation Precautions:  Precautions Precautions: Fall Precaution Comments: monitor HR an O2 sats Restrictions Weight Bearing  Restrictions: No General:   Vital Signs: Therapy Vitals Pulse Rate: 66 BP: (!) 165/57 mmHg Oxygen Therapy SpO2: 94 % O2 Device: Nasal Cannula O2 Flow Rate (L/min): 1 L/min Pulse Oximetry Type: Intermittent Pain: Pain Assessment Pain Score: 0-No pain  See Function Navigator for Current Functional Status.   Therapy/Group: Individual Therapy  Simonne Come 10/23/2015, 12:36 PM

## 2015-10-23 NOTE — Progress Notes (Signed)
ANTICOAGULATION CONSULT NOTE - Follow Up Consult  Pharmacy Consult for coumadin Indication: atrial fibrillation  Allergies  Allergen Reactions  . Carbamazepine Other (See Comments)    Reaction to tegretol - loopy  . Clonazepam Other (See Comments)    Unknown reaction  . Gemfibrozil Other (See Comments)    Patient is not aware of this allergy but states that she has side effects to a cholesterol medication in the past  . Macrobid [Nitrofurantoin] Nausea And Vomiting  . Oxycodone Other (See Comments)    hallucinations    Patient Measurements: Height: 5\' 4"  (162.6 cm) Weight: 233 lb 12.8 oz (106.051 kg) IBW/kg (Calculated) : 54.7 Heparin Dosing Weight:   Vital Signs: Temp: 98.2 F (36.8 C) (05/29 0643) Temp Source: Oral (05/29 0643) BP: 141/69 mmHg (05/29 0643) Pulse Rate: 61 (05/29 0643)  Labs:  Recent Labs  10/21/15 0422 10/22/15 0702 10/23/15 0420 10/23/15 0903  HGB  --   --   --  8.0*  HCT  --   --   --  26.9*  PLT  --   --   --  185  LABPROT 24.9* 24.7* 30.6*  --   INR 2.28* 2.25* 3.00*  --   CREATININE  --   --   --  2.75*    Estimated Creatinine Clearance: 22.6 mL/min (by C-G formula based on Cr of 2.75).   Medications:  Scheduled:  . amiodarone  200 mg Oral Daily  . amLODipine  10 mg Oral Daily  . aspirin EC  81 mg Oral Daily  . atorvastatin  80 mg Oral QPM  . calcium acetate  1,334 mg Oral TID WC  . darbepoetin (ARANESP) injection - NON-DIALYSIS  100 mcg Subcutaneous Q Thu-1800  . furosemide  40 mg Oral Daily  . insulin aspart  0-20 Units Subcutaneous TID WC  . insulin aspart  0-5 Units Subcutaneous QHS  . insulin aspart protamine- aspart  15 Units Subcutaneous BID WC  . isosorbide-hydrALAZINE  2 tablet Oral TID  . loratadine  10 mg Oral Daily  . metoprolol tartrate  25 mg Oral BID  . protein supplement  1 scoop Oral TID WC  . sertraline  100 mg Oral Daily  . Warfarin - Pharmacist Dosing Inpatient   Does not apply q1800   Infusions:     Assessment: 70 yo female with afib is currently on therapeutic coumadin.  INR today is 3.0  Goal of Therapy:  INR 2-3 Monitor platelets by anticoagulation protocol: Yes   Plan:  - Hold warfarin tonight - Daily INR  Joya San, PharmD Clinical Pharmacy Resident Pager # [phone] 10/23/2015 9:47 AM

## 2015-10-23 NOTE — Progress Notes (Addendum)
[place] PHYSICAL MEDICINE & REHABILITATION     PROGRESS NOTE  Subjective/Complaints:  Patient sitting up in her recliner eating breakfast. She states she slept well overnight and had a good weekend.  ROS:  Denies CP, SOB, N/V/D.  Objective: Vital Signs: Blood pressure 141/69, pulse 61, temperature 98.2 F (36.8 C), temperature source Oral, resp. rate 18, height 5\' 4"  (1.626 m), weight 106.051 kg (233 lb 12.8 oz), SpO2 93 %. No results found. No results for input(s): WBC, HGB, HCT, PLT in the last 72 hours. No results for input(s): NA, K, CL, GLUCOSE, BUN, CREATININE, CALCIUM in the last 72 hours.  Invalid input(s): CO CBG (last 3)   Recent Labs  10/23/15 0633  GLUCAP 107*    Wt Readings from Last 3 Encounters:  10/23/15 106.051 kg (233 lb 12.8 oz)  10/18/15 109.3 kg (240 lb 15.4 oz)  09/25/15 102.513 kg (226 lb)    Physical Exam:  BP 141/69 mmHg  Pulse 61  Temp(Src) 98.2 F (36.8 C) (Oral)  Resp 18  Ht 5\' 4"  (1.626 m)  Wt 106.051 kg (233 lb 12.8 oz)  BMI 40.11 kg/m2  SpO2 93% Constitutional: She appears well-developed and well-nourished. No distress.  HENT: Normocephalic and atraumatic.  Eyes: Conjunctivae and EOM are normal.   Cardiovascular: bradycardia.Murmur heard. Respiratory: Effort normal. No stridor. No respiratory distress. She has no wheezes.  GI: Soft. Bowel sounds are normal. She exhibits no distension. There is no tenderness.  Musculoskeletal: She exhibits edema In bilateral lower extremities. She exhibits no tenderness.  Neurological: She is alert and oriented.  Motor: B/l UE: 4+/5 proximal to distal B/l LE: 4+/5 proximally, 4/5 distally (pain) Skin: Neck wound healing. Skin is warm and dry. She is not diaphoretic. No erythema.  Psychiatric: She has a normal mood and affect. Her behavior is normal. Judgment and thought content normal.   Assessment/Plan: 1. Functional deficits secondary to debility which require 3+ hours per day of  interdisciplinary therapy in a comprehensive inpatient rehab setting. Physiatrist is providing close team supervision and 24 hour management of active medical problems listed below. Physiatrist and rehab team continue to assess barriers to discharge/monitor patient progress toward functional and medical goals.  Function:  Bathing Bathing position   Position: Wheelchair/chair at sink  Bathing parts Body parts bathed by patient: Right arm, Left arm, Chest, Abdomen, Front perineal area, Buttocks, Right upper leg, Left upper leg, Right lower leg, Left lower leg, Back Body parts bathed by helper: Right lower leg, Left lower leg  Bathing assist Assist Level: Set up      Upper Body Dressing/Undressing Upper body dressing   What is the patient wearing?: Pull over shirt/dress     Pull over shirt/dress - Perfomed by patient: Thread/unthread right sleeve, Thread/unthread left sleeve, Put head through opening, Pull shirt over trunk          Upper body assist Assist Level: Set up   Set up : To obtain clothing/put away  Lower Body Dressing/Undressing Lower body dressing   What is the patient wearing?: Pants, Underwear Underwear - Performed by patient: Thread/unthread right underwear leg, Thread/unthread left underwear leg, Pull underwear up/down   Pants- Performed by patient: Thread/unthread right pants leg, Thread/unthread left pants leg, Pull pants up/down                        Lower body assist Assist for lower body dressing: Supervision or verbal cues      Toileting Toileting  Toileting steps completed by patient: Performs perineal hygiene Toileting steps completed by helper: Adjust clothing prior to toileting, Adjust clothing after toileting Toileting Assistive Devices: Grab bar or rail  Toileting assist Assist level: Touching or steadying assistance (Pt.75%)   Transfers Chair/bed transfer   Chair/bed transfer method: Ambulatory Chair/bed transfer assist level:  Touching or steadying assistance (Pt > 75%) Chair/bed transfer assistive device: Librarian, academic     Max distance: 150 Assist level: Supervision or verbal cues   Wheelchair   Type: Manual Max wheelchair distance: 150 Assist Level: Supervision or verbal cues  Cognition Comprehension Comprehension assist level: Follows complex conversation/direction with no assist  Expression Expression assist level: Expresses complex ideas: With extra time/assistive device  Social Interaction Social Interaction assist level: Interacts appropriately 90% of the time - Needs monitoring or encouragement for participation or interaction.  Problem Solving Problem solving assist level: Solves complex 90% of the time/cues < 10% of the time  Memory Memory assist level: Recognizes or recalls 90% of the time/requires cueing < 10% of the time    Medical Problem List and Plan: 1. Abnormality of gait secondary to debility  Continue CIR  2. DVT Prophylaxis/Anticoagulation: Pharmaceutical: Coumadin 3. Pain Management: Hydrocodone prn for shoulder/wrist pain  4. Mood: Stable on Zoloft 100 mg daily. LCSW to follow for evaluation and support.  5. Neuropsych: This patient is capable of making decisions on her own behalf. 6. Skin/Wound Care: Routine care, avoid prolonged pressure and shearing.  Neck wound healing, cont dressing 7. Fluids/Electrolytes/Nutrition: Monitor I/O.   8. NSTEMI/Hx of CAD: Treated medically. On Metoprolol, lipitor, ASA and Bidil 9. Afib with RVR: Monitor HR. Continue amiodarone, metoprolol and coumadin  10. DM type 2: Monitor BS ac/hs. Continue 70/30 insulin bid. Will use SSI for elevated BS.   Will monitor with increased activity and consider adjustments as necessary, Relatively controlled CBG (last 3)   Recent Labs  10/23/15 0633  GLUCAP 107*  11. Acute on chronic renal failure:   Continue po lasix bid per nephrology, Cr slowly improving.   Appreciate nephro  recs 12. HTN: Monitor BP   Continue Norvasc, furosemide, lopressor  Bidil increased to 2 tabs on 5/26   Will monitor with increased activity, remains elevated, will consider additional chlorthalidone after lab results Filed Vitals:   10/22/15 2017 10/23/15 0643  BP: 186/80 141/69  Pulse: 55 61  Temp: 98.6 F (37 C) 98.2 F (36.8 C)  Resp: 19 18   13. ABLA  Hb 8.0 on 5/25 (stable)  Also with Fe deficiency s/p ferraheme 1 x dose  Labs pending for today 14. Hypoalbuminemia  Protein supplement started on 5/25 15. Epistaxis  Chronic, control local pressure 16.  LE edema ECHO has normal ej fx, likely from renal issues, pt feels it has improved, cont TEDs  LOS (Days) 5 A FACE TO FACE EVALUATION WAS PERFORMED  Ankit Lorie [place]ix 10/23/2015 8:40 AM

## 2015-10-23 NOTE — Progress Notes (Signed)
Physical Therapy Session Note  Patient Details  Name: Briana [name] MRN: [id number] Date of Birth: 03-28-1946  Today's Date: 10/23/2015 PT Individual Time: 1001-1103 and 1310-1410 PT Individual Time Calculation (min): 62 min and 60  Short Term Goals: Week 1:  PT Short Term Goal 1 (Week 1): =LTGs due to ELOS  Skilled Therapeutic Interventions/Progress Updates:   Pt received in recliner on 1L 02.  Discussed with pt possible D/C for tomorrow; pt states MD would like her to stay longer in order to try and wean her off 02.  Pt agreeable to attempt transfers/mobility in the room on room air to assess 02.  Pt able to don shoes today with noticeable decrease in  LE edema.  Pt performed all transfers recliner <> toilet and recliner <> bed ambulating with SPC and supervision with verbal cues for safety.  After multiple transfers and ambulating short distances in room on RA pt Sp02 decreased to 82% and HR: 74 bpm.  1L 02 returned to pt with Sp02 increasing to 97%; pt will continue to require 02 for mobility at this time.  Discussed with pt needing further training with mobility with 02 tubing before being able to be Mod I in the room; pt agreeable.  Pt ambulated with SPC to gym x 100' with supervision and performed BERG balance assessment; see below for details. Pt educated on falls risk and recommendations for home for increased safety.  Pt able to maintain Sp02 >90% on 1L throughout assessment but did require seated rest breaks due to fatigue.  Returned to room and pt left in recliner with LE elevated and all items within reach to await OT.   PM session: Pt received in recliner with feet elevated; feet noted to be less edematous; no c/o pain.  Performed ambulation to gym with [facility] and supervision.  Reviewed stair negotiation for home entry/exit; pt performed up/down 4 larger stairs (7") x 2 reps with rail in LUE and cane in RUE to ascend, cane in LUE and rail in RUE to descend beginning with mod A  progressing to supervision-min A.  Pt reporting that family may have ramp installed by D/C.  Performed up/down ramp x 2 reps with SPC and UE support on rail with supervision.  Provided pt with OTAGO LE strengthening HEP (seated and standing); pt return demonstrated each exercise with UE support on counter top and supervision with verbal cues for correct technique and intermittent sitting rest breaks due to fatigue.   Returned to room and pt cleared to perform sit <> stand each hour for pressure relief on buttocks; safety plan updated.  Pt left in recliner with all items within reach.     Therapy Documentation Precautions:  Precautions Precautions: Fall Precaution Comments: monitor HR an O2 sats Restrictions Weight Bearing Restrictions: No Pain: Pain Assessment Pain Score: 0-No painC/o neuropathic pain in feet; premorbid  Balance: Standardized Balance Assessment Standardized Balance Assessment: Berg Balance Test Berg Balance Test Sit to Stand: Able to stand without using hands and stabilize independently Standing Unsupported: Able to stand safely 2 minutes Sitting with Back Unsupported but Feet Supported on Floor or Stool: Able to sit safely and securely 2 minutes Stand to Sit: Sits safely with minimal use of hands Transfers: Able to transfer safely, minor use of hands Standing Unsupported with Eyes Closed: Able to stand 10 seconds with supervision Standing Ubsupported with Feet Together: Able to place feet together independently and stand for 1 minute with supervision From Standing, Reach Forward with Outstretched  Arm: Can reach confidently >25 cm (10") From Standing Position, Pick up Object from Floor: Able to pick up shoe safely and easily From Standing Position, Turn to Look Behind Over each Shoulder: Looks behind from both sides and weight shifts well Turn 360 Degrees: Able to turn 360 degrees safely but slowly Standing Unsupported, Alternately Place Feet on Step/Stool: Able to  complete >2 steps/needs minimal assist Standing Unsupported, One Foot in Front: Able to plae foot ahead of the other independently and hold 30 seconds Standing on One Leg: Tries to lift leg/unable to hold 3 seconds but remains standing independently Total Score: 45 Patient demonstrates increased fall risk as noted by score of 45/56 on Berg Balance Scale.  (<36= high risk for falls, close to 100%; 37-45 significant >80%; 46-51 moderate >50%; 52-55 lower >25%)   See Function Navigator for Current Functional Status.   Therapy/Group: Individual Therapy  Raylene Everts [facility] 10/23/2015, 12:21 PM

## 2015-10-24 ENCOUNTER — Inpatient Hospital Stay (HOSPITAL_COMMUNITY): Payer: Medicare Other | Admitting: Occupational Therapy

## 2015-10-24 ENCOUNTER — Inpatient Hospital Stay (HOSPITAL_COMMUNITY): Payer: Medicare Other | Admitting: Physical Therapy

## 2015-10-24 DIAGNOSIS — F32A Depression, unspecified: Secondary | ICD-10-CM | POA: Diagnosis present

## 2015-10-24 DIAGNOSIS — F329 Major depressive disorder, single episode, unspecified: Secondary | ICD-10-CM | POA: Diagnosis present

## 2015-10-24 LAB — GLUCOSE, CAPILLARY
GLUCOSE-CAPILLARY: 126 mg/dL — AB (ref 65–99)
GLUCOSE-CAPILLARY: 120 mg/dL — AB (ref 65–99)
GLUCOSE-CAPILLARY: 120 mg/dL — AB (ref 65–99)
GLUCOSE-CAPILLARY: 133 mg/dL — AB (ref 65–99)
GLUCOSE-CAPILLARY: 88 mg/dL (ref 65–99)
GLUCOSE-CAPILLARY: 111 mg/dL — AB (ref 65–99)
GLUCOSE-CAPILLARY: 121 mg/dL — AB (ref 65–99)
GLUCOSE-CAPILLARY: 134 mg/dL — AB (ref 65–99)
Glucose-Capillary: 132 mg/dL — ABNORMAL HIGH (ref 65–99)
Glucose-Capillary: 94 mg/dL (ref 65–99)
Glucose-Capillary: 117 mg/dL — ABNORMAL HIGH (ref 65–99)
Glucose-Capillary: 126 mg/dL — ABNORMAL HIGH (ref 65–99)
Glucose-Capillary: 149 mg/dL — ABNORMAL HIGH (ref 65–99)
Glucose-Capillary: 133 mg/dL — ABNORMAL HIGH (ref 65–99)
Glucose-Capillary: 191 mg/dL — ABNORMAL HIGH (ref 65–99)
Glucose-Capillary: 103 mg/dL — ABNORMAL HIGH (ref 65–99)
Glucose-Capillary: 93 mg/dL (ref 65–99)
Glucose-Capillary: 136 mg/dL — ABNORMAL HIGH (ref 65–99)

## 2015-10-24 LAB — PROTIME-INR
INR: 2.84 — ABNORMAL HIGH (ref 0.00–1.49)
PROTHROMBIN TIME: 29.4 s — AB (ref 11.6–15.2)

## 2015-10-24 MED ORDER — WARFARIN SODIUM 2.5 MG PO TABS
2.5000 mg | ORAL_TABLET | Freq: Once | ORAL | Status: AC
  Administered 2015-10-24: 2.5 mg via ORAL
  Filled 2015-10-24: qty 1

## 2015-10-24 MED ORDER — CALCIUM ACETATE (PHOS BINDER) 667 MG PO CAPS: 1334.0000 mg | ORAL_CAPSULE | Freq: Three times a day (TID) | ORAL | Status: DC

## 2015-10-24 MED ORDER — INSULIN NPH ISOPHANE & REGULAR (70-30) 100 UNIT/ML ~~LOC~~ SUSP: 15.0000 [IU] | Freq: Two times a day (BID) | SUBCUTANEOUS | Status: DC

## 2015-10-24 MED ORDER — WARFARIN SODIUM 5 MG PO TABS: ORAL_TABLET | ORAL | Status: DC

## 2015-10-24 MED ORDER — AMLODIPINE BESYLATE 10 MG PO TABS: 10.0000 mg | ORAL_TABLET | Freq: Every day | ORAL | Status: DC

## 2015-10-24 MED ORDER — SERTRALINE HCL 100 MG PO TABS: 100.0000 mg | ORAL_TABLET | Freq: Every day | ORAL | Status: DC

## 2015-10-24 MED ORDER — METOPROLOL TARTRATE 25 MG PO TABS: 25.0000 mg | ORAL_TABLET | Freq: Two times a day (BID) | ORAL | Status: DC

## 2015-10-24 MED ORDER — NITROGLYCERIN 0.4 MG SL SUBL: 0.4000 mg | SUBLINGUAL_TABLET | SUBLINGUAL | Status: DC | PRN

## 2015-10-24 MED ORDER — BENEPROTEIN PO POWD: 1.0000 | Freq: Three times a day (TID) | ORAL | Status: DC

## 2015-10-24 MED ORDER — FUROSEMIDE 40 MG PO TABS: 40.0000 mg | ORAL_TABLET | Freq: Every day | ORAL | Status: DC

## 2015-10-24 MED ORDER — ATORVASTATIN CALCIUM 80 MG PO TABS: 80.0000 mg | ORAL_TABLET | Freq: Every day | ORAL | Status: DC

## 2015-10-24 MED ORDER — ISOSORB DINITRATE-HYDRALAZINE 20-37.5 MG PO TABS: 2.0000 | ORAL_TABLET | Freq: Three times a day (TID) | ORAL | Status: DC

## 2015-10-24 MED ORDER — AMIODARONE HCL 200 MG PO TABS: 200.0000 mg | ORAL_TABLET | Freq: Every day | ORAL | Status: DC

## 2015-10-24 NOTE — Progress Notes (Signed)
Occupational Therapy Session Note  Patient Details  Name: Briana [name] MRN: [id number] Date of Birth: [birth year]  Today's Date: 10/24/2015 OT Individual Time: [phone] OT Individual Time Calculation (min): 55 min    Short Term Goals: No short term goals set  Skilled Therapeutic Interventions/Progress Updates:    Pt seen for skilled OT to facilitate activity tolerance with ADL training. Pt expressed desire to wean off 1L of O2. O2 removed and O2 sats checked every 10 min during session. Pt ambulated around the room into bathroom/ shower independently. No assist needed with any self care except for starting the TED hose over her feet. After activity her O2 sats would drop to 86%, with seated rest breaks and cues to breathe pt's O2 sats elevated to 96%.  Pt resting in chair at sink to complete grooming. Pt with all needs met.   Therapy Documentation Precautions:  Precautions Precautions: Fall Precaution Comments: O2 drops with activity Restrictions Weight Bearing Restrictions: No Oxygen Therapy SpO2: 94 % O2 Device: Not Delivered Pain: Pain Assessment Pain Assessment: No/denies pain  ADL:  See Function Navigator for Current Functional Status.   Therapy/Group: Individual Therapy  [place] 10/24/2015, 12:20 PM

## 2015-10-24 NOTE — Consult Note (Signed)
  INITIAL DIAGNOSTIC EVALUATION - [facility]   MEDICAL NECESSITY:  Cutina Deavila was seen on the [facility] for an initial diagnostic evaluation owing to assist in treatment planning.   Records indicate that Mrs. Chasse is a "70 y.o. female with history of CAD s/p CABG, CAS, HTN with LVH, CKD, chronic diastolic heart failure who was admitted via [facility] with chest pain, fever, hypoxia and leukocytosis on 09/30/15. She was treated for CAP as well as acute on chronic CHF.   She found to have ST changes with positive cardiac enzymes due to NSTEMI and started on IV heparin.  She develop A fib with RVR with drip in H/H and was treated with amiodarone and Cardizem. She required DCCV by Dr. Oval Linsey on 05/15 with improvement in heart rate.  NM cardiac stress test showed anterior defect with concerns for peri infarct ischemia but patient asymptomatic and medical management recommended until renal function improves.   Dr. Joelyn Oms was consulted due to worsening of renal failure. She was treated briefly with HD and diuretics resumed to help with UOP. She continues to require IV diuresis and HD cath removed today as creatinine is showing improvement."  During today's visit, Mrs. Dena denied experiencing any cognitive difficulties for any reason. Emotionally, she has maintained good spirits despite her recent medical circumstances. She does have a history of depression secondary to her granddaughter committing suicide in 2014 but she has since recovered to a degree. She attempted to obtain counseling support but she could not find a therapist that accepted her insurance. She does not feel that she requires this service now. She will mainly utilize the church for psychological support. No adjustment issues endorsed. Suicidal/homicidal ideation, plan or intent was denied. No manic or hypomanic episodes were reported. The patient denied ever  experiencing any auditory/visual hallucinations. No major behavioral or personality changes were endorsed.   Mrs. Cisse feels that she has been making strides in therapy. She described the rehab staff as "wonderful." Her only complaint was initially having the alarms on her bed but this was discontinued, which made her happy. She presently lives with her husband who is her biggest supporter. Her daughter has also been able to visit.   PROCEDURES: [1 unit 90791] Diagnostic clinical interview  Review of available records   IMPRESSION: Overall, Mrs. Manfredo denied suffering from any cognitive deficits and no overt issues were observed. She is adjusting well to this admission. While she has a history of depression secondary to her granddaughter's suicide in 2014, her depression is fairly well-controlled now and I do not feel that she requires counseling at this point. However, I encouraged her to obtain this service in the future if her depressive symptoms reoccur. I also discussed the option of grief counseling. With regard to this admission, I will informally check on the patient but no formal session is required or needs to be scheduled. However, I encouraged her to call upon [place] if something changes.        Rutha Bouchard, Psy.D., [place]  Board-certified Clinical Neuropsychologist

## 2015-10-24 NOTE — Progress Notes (Signed)
Occupational Therapy Discharge Summary  Patient Details  Name: JANIENE AARONS MRN: [id number]  Date of Birth: [birth year]   Patient has met 10 of 10 long term goals due to improved activity tolerance, improved balance and ability to compensate for deficits.  Patient to discharge at overall Modified Independent level.  Patient's care partner (daughter) is independent to provide the necessary physical assistance at discharge.    Reasons goals not met: n/a  Recommendation:  Patient will benefit from ongoing skilled OT services in home health setting to continue to advance functional skills in the area of iADL.  Equipment: No equipment provided  Reasons for discharge: treatment goals met  Patient/family agrees with progress made and goals achieved: Yes  OT Discharge Precautions/Restrictions  Precautions Precautions: Fall Precaution Comments: O2 drops with activity   Vital Signs Oxygen Therapy SpO2: 94 % O2 Device: Not Delivered Pain Pain Assessment Pain Assessment: No/denies pain  ADL ADL ADL Comments: mod I Vision/Perception  Vision- History Baseline Vision/History: Wears glasses;Macular Degeneration Wears Glasses: Reading only Patient Visual Report: Blurring of vision Vision- Assessment Vision Assessment?: No apparent visual deficits  Cognition Overall Cognitive Status: Within Functional Limits for tasks assessed Arousal/Alertness: Awake/alert Orientation Level: Oriented X4 Memory: Appears intact Safety/Judgment: Appears intact Sensation Sensation Light Touch: Appears Intact Stereognosis: Appears Intact Proprioception: Appears Intact Coordination Gross Motor Movements are Fluid and Coordinated: Yes Fine Motor Movements are Fluid and Coordinated: Yes Motor  Motor Motor: Within Functional Limits Mobility  Bed Mobility Bed Mobility: Supine to Sit;Sit to Supine Supine to Sit: 6: Modified independent (Device/Increase time) Sit to Supine: 6: Modified  independent (Device/Increase time) Transfers Sit to Stand: 6: Modified independent (Device/Increase time) Stand to Sit: 6: Modified independent (Device/Increase time)  Trunk/Postural Assessment  Cervical Assessment Cervical Assessment: Within Functional Limits Thoracic Assessment Thoracic Assessment: Within Functional Limits Lumbar Assessment Lumbar Assessment: Within Functional Limits Postural Control Postural Control: Within Functional Limits  Balance Balance Balance Assessed: Yes Static Standing Balance Static Standing - Level of Assistance: 6: Modified independent (Device/Increase time) Dynamic Standing Balance Dynamic Standing - Level of Assistance: 6: Modified independent (Device/Increase time) Extremity/Trunk Assessment RUE Assessment RUE Assessment: Within Functional Limits LUE Assessment LUE Assessment: Within Functional Limits   See Function Navigator for Current Functional Status.  [place] 10/24/2015, 12:28 PM

## 2015-10-24 NOTE — Progress Notes (Signed)
[facility] PHYSICAL MEDICINE & REHABILITATION     PROGRESS NOTE  Subjective/Complaints:  Patient sitting up at the edge of the bed this morning, eating breakfast. She states she is doing well and would like to know when she can go home, but also states that she will stay as long as she needs to and it is best for her.  ROS:  Denies CP, SOB, N/V/D.  Objective: Vital Signs: Blood pressure 142/64, pulse 60, temperature 98.1 F (36.7 C), temperature source Oral, resp. rate 17, height 5\' 4"  (1.626 m), weight 106.55 kg (234 lb 14.4 oz), SpO2 96 %. No results found.  Recent Labs  10/23/15 0903  WBC 6.3  HGB 8.0*  HCT 26.9*  PLT 185    Recent Labs  10/23/15 0903  NA 139  K 4.7  CL 106  GLUCOSE 175*  BUN 60*  CREATININE 2.75*  CALCIUM 9.0   CBG (last 3)   Recent Labs  10/23/15 0633 10/24/15 0645  GLUCAP 107* 88    Wt Readings from Last 3 Encounters:  10/24/15 106.55 kg (234 lb 14.4 oz)  10/18/15 109.3 kg (240 lb 15.4 oz)  09/25/15 102.513 kg (226 lb)    Physical Exam:  BP 142/64 mmHg  Pulse 60  Temp(Src) 98.1 F (36.7 C) (Oral)  Resp 17  Ht 5\' 4"  (1.626 m)  Wt 106.55 kg (234 lb 14.4 oz)  BMI 40.30 kg/m2  SpO2 96% Constitutional: She appears well-developed and well-nourished. No distress.  HENT: Normocephalic and atraumatic.  Eyes: Conjunctivae and EOM are normal.   Cardiovascular: Regular rate and rhythm.Murmur heard. Respiratory: Effort normal. No stridor. No respiratory distress. She has no wheezes.  GI: Soft. Bowel sounds are normal. She exhibits no distension. There is no tenderness.  Musculoskeletal: She exhibits edema In bilateral lower extremities. She exhibits no tenderness.  Neurological: She is alert and oriented.  Motor: B/l UE: 4+/5 proximal to distal B/l LE: 4+/5 proximally, 4/5 distally (pain) Skin: Neck wound with superficial scab. Skin is warm and dry. She is not diaphoretic. No erythema.  Psychiatric: She has a normal mood and affect.  Her behavior is normal. Judgment and thought content normal.   Assessment/Plan: 1. Functional deficits secondary to debility which require 3+ hours per day of interdisciplinary therapy in a comprehensive inpatient rehab setting. Physiatrist is providing close team supervision and 24 hour management of active medical problems listed below. Physiatrist and rehab team continue to assess barriers to discharge/monitor patient progress toward functional and medical goals.  Function:  Bathing Bathing position   Position: Wheelchair/chair at sink  Bathing parts Body parts bathed by patient: Right arm, Left arm, Chest, Abdomen, Front perineal area, Buttocks, Right upper leg, Left upper leg, Right lower leg, Left lower leg, Back Body parts bathed by helper: Right lower leg, Left lower leg  Bathing assist Assist Level: Set up      Upper Body Dressing/Undressing Upper body dressing   What is the patient wearing?: Pull over shirt/dress     Pull over shirt/dress - Perfomed by patient: Thread/unthread right sleeve, Thread/unthread left sleeve, Put head through opening, Pull shirt over trunk          Upper body assist Assist Level: Set up   Set up : To obtain clothing/put away  Lower Body Dressing/Undressing Lower body dressing   What is the patient wearing?: Pants, Underwear Underwear - Performed by patient: Thread/unthread right underwear leg, Thread/unthread left underwear leg, Pull underwear up/down   Pants- Performed by patient:  Thread/unthread right pants leg, Thread/unthread left pants leg, Pull pants up/down                        Lower body assist Assist for lower body dressing: Supervision or verbal cues      Toileting Toileting   Toileting steps completed by patient: Adjust clothing prior to toileting, Performs perineal hygiene, Adjust clothing after toileting Toileting steps completed by helper: Adjust clothing prior to toileting, Adjust clothing after  toileting Toileting Assistive Devices: Grab bar or rail  Toileting assist Assist level: More than reasonable time   Transfers Chair/bed transfer   Chair/bed transfer method: Ambulatory Chair/bed transfer assist level: Touching or steadying assistance (Pt > 75%) Chair/bed transfer assistive device: Librarian, academic     Max distance: 150 Assist level: Supervision or verbal cues   Wheelchair   Type: Manual Max wheelchair distance: 150 Assist Level: Supervision or verbal cues  Cognition Comprehension Comprehension assist level: Follows complex conversation/direction with no assist  Expression Expression assist level: Expresses complex ideas: With extra time/assistive device  Social Interaction Social Interaction assist level: Interacts appropriately 90% of the time - Needs monitoring or encouragement for participation or interaction.  Problem Solving Problem solving assist level: Solves complex 90% of the time/cues < 10% of the time  Memory Memory assist level: Recognizes or recalls 90% of the time/requires cueing < 10% of the time    Medical Problem List and Plan: 1. Abnormality of gait secondary to debility  Continue CIR  2. DVT Prophylaxis/Anticoagulation: Pharmaceutical: Coumadin 3. Pain Management: Hydrocodone prn for shoulder/wrist pain  4. Mood: Stable on Zoloft 100 mg daily. LCSW to follow for evaluation and support.  5. Neuropsych: This patient is capable of making decisions on her own behalf. 6. Skin/Wound Care: Routine care, avoid prolonged pressure and shearing.  Neck wound healing, cont dressing 7. Fluids/Electrolytes/Nutrition: Monitor I/O.   8. NSTEMI/Hx of CAD: Treated medically. On Metoprolol, lipitor, ASA and Bidil 9. Afib with RVR: Monitor HR. Continue amiodarone, metoprolol and coumadin  10. DM type 2: Monitor BS ac/hs. Continue 70/30 insulin bid. Will use SSI for elevated BS.   Will monitor with increased activity and consider adjustments  as necessary,   Relatively controlled at present CBG (last 3)   Recent Labs  10/23/15 0633 10/24/15 0645  GLUCAP 107* 88  11. Acute on chronic renal failure:   Continue po lasix bid per nephrology.   Appreciate nephro recs  Will continue to monitor 12. HTN: Monitor BP   Continue Norvasc, furosemide, lopressor  Bidil increased to 2 tabs on 5/26   Will monitor with increased activity  Remains elevated, however improved in the last 24 hours 13. ABLA  Hb 8.0 on 5/29 (stable)  Also with Fe deficiency s/p ferraheme 1 x dose 14. Hypoalbuminemia  Protein supplement started on 5/25 15. Epistaxis  Chronic, control local pressure 16.  LE edema ECHO has normal ej fx, likely from renal issues, pt feels it has improved, cont TEDs  LOS (Days) 6 A FACE TO FACE EVALUATION WAS PERFORMED  Coye Dawood Lorie Phenix 10/24/2015 7:55 AM

## 2015-10-24 NOTE — Progress Notes (Signed)
Social Work Patient ID: Briana [name], female   DOB: 1945-08-12, 70 y.o.   MRN: [id number]   CSW met with pt to discuss where she's received her oxygen in the past.  She has used [facility] and CSW made referral to them.  Pt still cannot remember [place] agency and CSW tried to call several agencies in that area, without success.  CSW made referral through [facility].  Pt feels ready to go home tomorrow.  CSW will continue to follow and assist as needed.

## 2015-10-24 NOTE — Discharge Summary (Signed)
Physician Discharge Summary  Patient ID: Briana [name] MRN: [id number] DOB/AGE: 71-02-1946 71 y.o.  Admit date: 10/18/2015 Discharge date: 10/25/2015  Discharge Diagnoses:  Principal Problem:   Debility Active Problems:   Abnormality of gait   Long term current use of anticoagulant therapy   Chronic pain syndrome   Hx of non-ST elevation myocardial infarction (NSTEMI)   Paroxysmal atrial fibrillation (HCC)   Diabetes mellitus type 2 in obese (HCC)   Chronic kidney disease (CKD), stage IV (severe) (HCC)   Benign essential HTN   Hypoalbuminemia due to protein-calorie malnutrition [facility])   Depression   Discharged Condition: stable   Labs:  Basic Metabolic Panel:  Recent Labs Lab 10/19/15 0444 10/23/15 0903  NA 141 139  K 4.6 4.7  CL 105 106  CO2 28 25  GLUCOSE 138* 175*  BUN 54* 60*  CREATININE 2.69* 2.75*  CALCIUM 8.8* 9.0    CBC:  Recent Labs Lab 10/19/15 0444 10/23/15 0903  WBC 7.6 6.3  NEUTROABS 5.0 4.2  HGB 8.0* 8.0*  HCT 26.6* 26.9*  MCV 89.0 90.9  PLT 199 185    CBG:  Recent Labs Lab 10/24/15 0645 10/24/15 1202 10/24/15 1640 10/24/15 2106 10/25/15 0636  GLUCAP 88 111* 121* 134* 109*    Lab Results  Component Value Date   INR 2.43* 10/25/2015   INR 2.84* 10/24/2015   INR 3.00* 10/23/2015    Brief HPI:   Briana [name] is a 70 y.o. female with history of CAD s/p CABG, CAS, HTN with LVH, CKD, chronic diastolic heart failure who was admitted via [facility] with chest pain, fever, hypoxia and leucocytosis on 09/30/15. She was treated for CAP as well as acute on chronic CHF. She found to have ST changes with positive cardiac enzymes due to NSTEMI and developed A fib with RVR with drip in H/H.  She required DCCV  05/15 with return to NSR and continues on amiodarone/cardizem.  NM cardiac stress test showed anterior defect with concerns for peri infarct ischemia but patient asymptomatic and medical management recommended untill renal function  improves. She was treated briefly with HD for acute on chronic renal failure and diuretics were resumed to help with UOP. She was started on epo to help with anemia of chronic disease and has been transitioned to coumadin.  She continues to have hypoxia with activity as well as decrease in endurance. Therapy ongoing and CIR recommended due to debility   Hospital Course: [name] was admitted to rehab 10/18/2015 for inpatient therapies to consist of PT and OT at least three hours five days a week. Past admission physiatrist, therapy team and rehab RN have worked together to provide customized collaborative inpatient rehab. Activity tolerance has improved and no chest pain reported with increase in activity. Heart rate has been monitored on bid basis and has been in 50-60 range.  She has been in NSR and coumadin is therapeutic with INR at 2.43 at discharge.   She is to continue on 2.5 mg on Tue/Thus and 5 mg all other days.  Next protime is to be checked at cardiology office on 6/2 follow up. Blood pressure were monitored on bid basis and was trending upwards therefore Bidil was titrated up with better control..  Diabetes has been monitored with ac/hs checks and blood sugars are well controlled on 15 units bid of 70/30 insulin. She has been educated on dietary compliance and is to follow up with Dr. Wynelle Cleveland for titration to home dose.  UOP has been good and weight is down to 106 kg. BLE edema has resolved with elevation as well as use of TEDs.  Renal status has been slowly improving with Cr down to 2.75. She is to have renal status repeated on 6/4 with results to her nephrologist.  She continues to have anemia of chronic disease and did receive dose of aranesp 100 mcg on 05/23 as well as feraheme IV 510 mg on 5/27.  She continues to have hypoxia with activity and saturations drop down to 86% requiring rest breaks and oxygen to recover.  She has been educated on energy conservation measures and HEP.  She  was advised to continue to use oxygen with activity and this can be weaned off over next few weeks as endurance improves.  She will continue to receive follow up HHPT and [facility] after discharge.   Rehab course: During patient's stay in rehab weekly team conferences were held to monitor patient's progress, set goals and discuss barriers to discharge. At admission, patient required min assist with mobility and basic self care tasks.  She has had improvement in activity tolerance, balance and strength.  She is able to complete ADL tasks at modified independent level. She is modified independent for transfers and is able to ambulate 150' with straight cane and increased time. She is able to navigate 12 stairs with one rail at modified independent level. family . Family education was done with daughter who can assist as needed after discharge.      Disposition: 01-Home or Self Care   Diet: Diabetic/Heart Healthy diet.   Special Instructions: 1. Draw Renal panel on 06/05 and call results to Dr. Theador Hawthorne. 2. Use oxygen with activity and at sleep.  3. Check Blood sugars 2-3 times a day. Contact Dr. Wynelle Cleveland if BS start trending over 150.       Medication List    STOP taking these medications        furosemide 10 MG/ML injection  Commonly known as:  LASIX  Replaced by:  furosemide 40 MG tablet     JANUVIA 25 MG tablet  Generic drug:  sitaGLIPtin     potassium chloride SA 20 MEQ tablet  Commonly known as:  K-DUR,KLOR-CON     tiZANidine 4 MG capsule  Commonly known as:  ZANAFLEX      TAKE these medications        amiodarone 200 MG tablet  Commonly known as:  PACERONE  Take 1 tablet (200 mg total) by mouth daily.     amLODipine 10 MG tablet  Commonly known as:  NORVASC  Take 1 tablet (10 mg total) by mouth daily.     aspirin 81 MG EC tablet  Take 1 tablet (81 mg total) by mouth daily.     atorvastatin 80 MG tablet  Commonly known as:  LIPITOR  Take 1 tablet (80  mg total) by mouth daily.     calcium acetate 667 MG capsule  Commonly known as:  PHOSLO  Take 2 capsules (1,334 mg total) by mouth 3 (three) times daily with meals.     cetirizine 10 MG tablet  Commonly known as:  ZYRTEC  Take 10 mg by mouth at bedtime.     cholecalciferol 1000 units tablet  Commonly known as:  VITAMIN D  Take 1,000 Units by mouth daily.     furosemide 40 MG tablet  Commonly known as:  LASIX  Take 1 tablet (40 mg total) by mouth  daily.     HYDROcodone-acetaminophen 7.5-325 MG tablet  Commonly known as:  NORCO  Take 1 tablet by mouth every 4 (four) hours as needed for moderate pain (Must last 30 days.  Do not drive or operate machinery while taking this medicine.).     insulin NPH-regular Human (70-30) 100 UNIT/ML injection  Commonly known as:  NOVOLIN 70/30  Inject 15 Units into the skin 2 (two) times daily with a meal.     isosorbide-hydrALAZINE 20-37.5 MG tablet  Commonly known as:  BIDIL  Take 2 tablets by mouth 3 (three) times daily.     loratadine 10 MG tablet  Commonly known as:  CLARITIN  Take 1 tablet (10 mg total) by mouth daily.     metoprolol tartrate 25 MG tablet  Commonly known as:  LOPRESSOR  Take 1 tablet (25 mg total) by mouth 2 (two) times daily.     nitroGLYCERIN 0.4 MG SL tablet  Commonly known as:  NITROSTAT  Place 1 tablet (0.4 mg total) under the tongue every 5 (five) minutes as needed for chest pain. Max three pills     protein supplement Powd  Take 6 g by mouth 3 (three) times daily with meals.     sertraline 100 MG tablet  Commonly known as:  ZOLOFT  Take 1 tablet (100 mg total) by mouth at bedtime.     warfarin 5 MG tablet  Commonly known as:  COUMADIN  Take one pill on Mon, Wed, Fri, Sat, Sun. Take 1/2 pill on Tue and Thus        Follow-up Information    Call Kaylena Pacifico Lorie Phenix, MD.   Specialty:  Physical Medicine and Rehabilitation   Why:  As needed   Contact information:   [place]  [phone]       Follow up with Neysa Hotter B, PA-C On 11/06/2015.   Specialty:  Physician Assistant   Why:  @ 12:30 PM   Contact information:   [address] [place] [address] [phone]       Follow up with Liana Gerold, MD. Call on 11/08/2015.   Specialty:  Nephrology   Why:  Be there at 1 pm for follow up appointment   Contact information:   [address] [address] [phone]       Follow up with Kate Sable, MD On 10/27/2015.   Specialty:  Cardiology   Why:  Be there at 3 pm for follow appointment and protime draw.    Contact information:   [address] [place] [address] [phone]       Signed: Bary Leriche 10/25/2015, 4:27 PM

## 2015-10-24 NOTE — Progress Notes (Signed)
Occupational Therapy Session Note  Patient Details  Name: NETRA VESSELS MRN: [id number] Date of Birth: Dec 16, 1945  Today's Date: 10/24/2015 OT Individual Time: 1100-1200 OT Individual Time Calculation (min): 60 min    Short Term Goals: Week 1:  OT Short Term Goal 1 (Week 1): STGS= LTGS SECONDARY TO SHORT ESTIMATED LOS  Skilled Therapeutic Interventions/Progress Updates:    Therapeutic activity: Focus on building up activity tolerance/ endurance during ADL/ activities of leisure. Discussed energy conservation at home and during ADLs. Pt still requires at least .5 liter of O2 during functional tasks. Pt desats to 86% on RA during activity and does required extra time to recover. Pt does have a finger pulse ox at home and discussed checking her o2 during tasks. Performed functional ambulation with SPC during distance ambulation and outside simulating a community outing. Pt mod I and able to manage her O2 tubing when ambulating but did needed seated rest breaks and to be pushed in w/c part of the way due to fatigue. Return to her room and sats on O2 were at 96%.  Therapy Documentation Precautions:  Precautions Precautions: Fall Precaution Comments: O2 drops with activity Restrictions Weight Bearing Restrictions: No General:   Vital Signs: Therapy Vitals Temp: 98.7 F (37.1 C) Temp Source: Oral Pulse Rate: (!) 58 Resp: 18 BP: (!) 145/41 mmHg Patient Position (if appropriate): Sitting Oxygen Therapy SpO2: 94 % O2 Device: Nasal Cannula O2 Flow Rate (L/min): 1 L/min Pain:  no c/o pain ADL: ADL ADL Comments: mod I  See Function Navigator for Current Functional Status.   Therapy/Group: Individual Therapy  Willeen Cass [facility] 10/24/2015, 3:12 PM

## 2015-10-24 NOTE — Progress Notes (Addendum)
Physical Therapy Discharge Summary  Patient Details  Name: RYLYNN KOBS MRN: [id number] Date of Birth: 11-17-1945  Today's Date: 10/24/2015 PT Individual Time: 1006-1105 and [id number] PT Individual Time Calculation (min): 59 min and 25 min    Patient has met 9 of 9 long term goals due to improved activity tolerance, improved balance and increased strength.  Patient to discharge at an ambulatory level Modified Independent.      Recommendation:  Patient will benefit from ongoing skilled PT services in home health setting to continue to advance safe functional mobility, address ongoing impairments in activity tolerance, and balance, and minimize fall risk.  Equipment: No equipment provided  Reasons for discharge: treatment goals met  Patient/family agrees with progress made and goals achieved: Yes   Skilled Therapeutic Intervention:  Session 1: Pt received resting in recliner, daughter present, no c/o pain and agreeable to therapy session.  Session focus on transfers, gait on even and uneven surfaces, stair negotiation, energy conservation, and patient education.    Pt transfers throughout session mod I with SPC and ambulates throughout unit, max distance 150' with SPC and mod I.  Pt performs stair negotiation x12 steps with 1 handrail and SPC mod I.   PT provided pt education on energy conservation, endurance recovery, home activity, and O2 levels.  Pt O2 after amb 125' 90% on room air; after amb within ortho gym to complete car transfer, negotiate ramp, and amb on compliant surfaces 86% recovery to 90% on room air in 2 minutes; after stair negotiation 88% required .5 L of O2 via [place] to recover to 95%.   Pt returned to room at end of session, PT issued mod I in room sign and educated pt on that.  Handoff to OT at end of session.   Session 2: Pt received resting in recliner and agreeable to therapy session, no c/o pain.  O2 remained 90-91% on 1L throughout session.  Session focus  on high level gait training while indep managing O2 tubing to simulate home environment.  Pt amb to and from therapy gym with SPC, managing O2 line mod I.  Pt instructed in negotiation of obstacle course x2 focus on stepping over, around, and onto compliant and non-compliant surfaces with SPC while managing O2 line.  Pt performed initially with supervision progress to mod I.  Pt returned to room at end of session and positioned upright in recliner with call bell in reach and needs met.   PT Discharge Precautions/Restrictions Precautions Precautions: Fall Precaution Comments: O2 drops with activity Restrictions Weight Bearing Restrictions: No  Cognition Overall Cognitive Status: Within Functional Limits for tasks assessed Arousal/Alertness: Awake/alert Orientation Level: Oriented X4 Memory: Appears intact Safety/Judgment: Appears intact Sensation Sensation Light Touch: Appears Intact Coordination Gross Motor Movements are Fluid and Coordinated: Yes Fine Motor Movements are Fluid and Coordinated: Yes Motor  Motor Motor: Within Functional Limits  Mobility Bed Mobility Bed Mobility: Supine to Sit;Sit to Supine Supine to Sit: 6: Modified independent (Device/Increase time) Sit to Supine: 6: Modified independent (Device/Increase time) Transfers Transfers: Yes Sit to Stand: 6: Modified independent (Device/Increase time) Stand to Sit: 6: Modified independent (Device/Increase time) Locomotion  Ambulation Ambulation: Yes Ambulation/Gait Assistance: 6: Modified independent (Device/Increase time) Ambulation Distance (Feet): 150 Feet Assistive device: Straight cane Gait Gait: Yes Gait Pattern: Step-through pattern Stairs / Additional Locomotion Stairs: Yes Stairs Assistance: 6: Modified independent (Device/Increase time) Stair Management Technique: One rail Right;One rail Left;With cane Number of Stairs: 12 Ramp: 6: Modified independent (Device) Curb: 6:  Modified independent  (Device/increase time) Product manager Mobility: No  Trunk/Postural Assessment  Cervical Assessment Cervical Assessment: Within Functional Limits Thoracic Assessment Thoracic Assessment: Within Functional Limits Lumbar Assessment Lumbar Assessment: Within Functional Limits Postural Control Postural Control: Within Functional Limits  Balance Balance Balance Assessed: Yes Static Standing Balance Static Standing - Level of Assistance: 6: Modified independent (Device/Increase time) Dynamic Standing Balance Dynamic Standing - Level of Assistance: 6: Modified independent (Device/Increase time) Extremity Assessment  RUE Assessment RUE Assessment: Within Functional Limits LUE Assessment LUE Assessment: Within Functional Limits RLE Assessment RLE Assessment: Exceptions to [facility] RLE AROM (degrees) RLE Overall AROM Comments: WFL assessed in sitting RLE Strength Right Hip Flexion: 4/5 Right Knee Flexion: 4/5 Right Knee Extension: 5/5 Right Ankle Dorsiflexion: 4+/5 Right Ankle Plantar Flexion: 4+/5 LLE Assessment LLE Assessment: Exceptions to WFL LLE AROM (degrees) LLE Overall AROM Comments: WFL assessed in sitting LLE Strength Left Hip Flexion: 3+/5 Left Knee Flexion: 4+/5 Left Knee Extension: 5/5 Left Ankle Dorsiflexion: 4-/5 Left Ankle Plantar Flexion: 3+/5   See Function Navigator for Current Functional Status.  Illyanna Petillo E Penven-Crew 10/24/2015, 12:06 PM

## 2015-10-24 NOTE — Progress Notes (Signed)
ANTICOAGULATION CONSULT NOTE - Follow Up Consult  Pharmacy Consult for coumadin Indication: atrial fibrillation  Allergies  Allergen Reactions  . Carbamazepine Other (See Comments)    Reaction to tegretol - loopy  . Clonazepam Other (See Comments)    Unknown reaction  . Gemfibrozil Other (See Comments)    Patient is not aware of this allergy but states that she has side effects to a cholesterol medication in the past  . Macrobid [Nitrofurantoin] Nausea And Vomiting  . Oxycodone Other (See Comments)    hallucinations    Patient Measurements: Height: 5\' 4"  (162.6 cm) Weight: 234 lb 14.4 oz (106.55 kg) IBW/kg (Calculated) : 54.7 Heparin Dosing Weight:   Vital Signs: Temp: 98.1 F (36.7 C) (05/30 0500) Temp Source: Oral (05/30 0500) BP: 142/64 mmHg (05/30 0500) Pulse Rate: 60 (05/30 0500)  Labs:  Recent Labs  10/22/15 0702 10/23/15 0420 10/23/15 0903 10/24/15 0718  HGB  --   --  8.0*  --   HCT  --   --  26.9*  --   PLT  --   --  185  --   LABPROT 24.7* 30.6*  --  29.4*  INR 2.25* 3.00*  --  2.84*  CREATININE  --   --  2.75*  --     Estimated Creatinine Clearance: 22.7 mL/min (by C-G formula based on Cr of 2.75).   Medications:  Scheduled:  . amiodarone  200 mg Oral Daily  . amLODipine  10 mg Oral Daily  . aspirin EC  81 mg Oral Daily  . atorvastatin  80 mg Oral QPM  . calcium acetate  1,334 mg Oral TID WC  . darbepoetin (ARANESP) injection - NON-DIALYSIS  100 mcg Subcutaneous Q Thu-1800  . furosemide  40 mg Oral Daily  . insulin aspart  0-20 Units Subcutaneous TID WC  . insulin aspart  0-5 Units Subcutaneous QHS  . insulin aspart protamine- aspart  15 Units Subcutaneous BID WC  . isosorbide-hydrALAZINE  2 tablet Oral TID  . loratadine  10 mg Oral Daily  . metoprolol tartrate  25 mg Oral BID  . protein supplement  1 scoop Oral TID WC  . sertraline  100 mg Oral Daily  . Warfarin - Pharmacist Dosing Inpatient   Does not apply q1800   Infusions:     Assessment: 70 yo female with afib is currently on therapeutic coumadin.  INR today is down to 2.84 after holding dose last night.   Goal of Therapy:  INR 2-3 Monitor platelets by anticoagulation protocol: Yes   Plan:  Coumadin 2.5 mg po x1 Daily PT/INR  Cathy Crounse, Tsz-Yin 10/24/2015,8:18 AM

## 2015-10-25 LAB — PROTIME-INR
INR: 2.43 — ABNORMAL HIGH (ref 0.00–1.49)
Prothrombin Time: 26.1 seconds — ABNORMAL HIGH (ref 11.6–15.2)

## 2015-10-25 LAB — OCCULT BLOOD X 1 CARD TO LAB, STOOL: Fecal Occult Bld: NEGATIVE

## 2015-10-25 LAB — GLUCOSE, CAPILLARY: Glucose-Capillary: 109 mg/dL — ABNORMAL HIGH (ref 65–99)

## 2015-10-25 MED ORDER — WARFARIN SODIUM 5 MG PO TABS: ORAL_TABLET | ORAL | Status: DC

## 2015-10-25 MED ORDER — COUMADIN BOOK
Freq: Once | Status: DC
  Filled 2015-10-25: qty 1

## 2015-10-25 MED ORDER — WARFARIN SODIUM 5 MG PO TABS: 5.0000 mg | ORAL_TABLET | Freq: Once | ORAL | Status: DC

## 2015-10-25 NOTE — Discharge Instructions (Signed)
Inpatient Rehab Discharge Instructions  [place] Discharge date and time:  10/25/15  Activities/Precautions/ Functional Status: Activity: activity as tolerated No driving till cleared by MD. Use oxygen with activity and at nights.  Diet: cardiac diet Low salt Wound Care: none needed   Functional status:  ___ No restrictions     ___ Walk up steps independently ___ 24/7 supervision/assistance   ___ Walk up steps with assistance _X__ Intermittent supervision/assistance  ___ Bathe/dress independently _X__ Walk with walker    ___ Bathe/dress with assistance ___ Walk Independently    ___ Shower independently ___ Walk with assistance    ___ Shower with assistance _X__ No alcohol     ___ Return to work/school ________  COMMUNITY REFERRALS UPON DISCHARGE:   Home Health:   PT     OT     RN     Agency:  [facility] Phone:  [phone] Medical Equipment/Items Ordered:  Oxygen  Agency/Supplier:  [facility]         Phone:  [phone]  GENERAL COMMUNITY RESOURCES FOR PATIENT/FAMILY: Mental Health:  [facility]       Phone:  [phone]                          They will provide grief counseling free of charge.  Special Instructions: 1. Check weight daily and contact Cardiology if weight goes up by 2 lbs overnight or 5 lbs over 2-3 days. Wear support stockings daily.  2. Check blood sugars 2-3 times as day before meals and/or at bedtime.     My questions have been answered and I understand these instructions. I will adhere to these goals and the provided educational materials after my discharge from the hospital.  Patient/Caregiver Signature _______________________________ Date __________  Clinician Signature _______________________________________ Date __________  Please bring this form and your medication list with you to all your follow-up doctor's appointments.

## 2015-10-25 NOTE — Progress Notes (Signed)
ANTICOAGULATION CONSULT NOTE - Follow Up Consult  Pharmacy Consult for couamdin Indication: atrial fibrillation  Allergies  Allergen Reactions  . Carbamazepine Other (See Comments)    Reaction to tegretol - loopy  . Clonazepam Other (See Comments)    Unknown reaction  . Gemfibrozil Other (See Comments)    Patient is not aware of this allergy but states that she has side effects to a cholesterol medication in the past  . Macrobid [Nitrofurantoin] Nausea And Vomiting  . Oxycodone Other (See Comments)    hallucinations    Patient Measurements: Height: 5\' 4"  (162.6 cm) Weight: 213 lb 6.5 oz (96.8 kg) IBW/kg (Calculated) : 54.7 Heparin Dosing Weight:   Vital Signs: Temp: 98.6 F (37 C) (05/31 0536) Temp Source: Oral (05/31 0536) BP: 149/33 mmHg (05/31 0536) Pulse Rate: 49 (05/31 0536)  Labs:  Recent Labs  10/23/15 0420 10/23/15 0903 10/24/15 0718  HGB  --  8.0*  --   HCT  --  26.9*  --   PLT  --  185  --   LABPROT 30.6*  --  29.4*  INR 3.00*  --  2.84*  CREATININE  --  2.75*  --     Estimated Creatinine Clearance: 21.5 mL/min (by C-G formula based on Cr of 2.75).   Medications:  Scheduled:  . amiodarone  200 mg Oral Daily  . amLODipine  10 mg Oral Daily  . aspirin EC  81 mg Oral Daily  . atorvastatin  80 mg Oral QPM  . calcium acetate  1,334 mg Oral TID WC  . darbepoetin (ARANESP) injection - NON-DIALYSIS  100 mcg Subcutaneous Q Thu-1800  . furosemide  40 mg Oral Daily  . insulin aspart  0-20 Units Subcutaneous TID WC  . insulin aspart  0-5 Units Subcutaneous QHS  . insulin aspart protamine- aspart  15 Units Subcutaneous BID WC  . isosorbide-hydrALAZINE  2 tablet Oral TID  . loratadine  10 mg Oral Daily  . metoprolol tartrate  25 mg Oral BID  . protein supplement  1 scoop Oral TID WC  . sertraline  100 mg Oral Daily  . Warfarin - Pharmacist Dosing Inpatient   Does not apply q1800   Infusions:    Assessment: 70 yo female with afib is currently on  therapeutic coumadin.  INR today is down to 2.43.  Goal of Therapy:  INR 2-3 Monitor platelets by anticoagulation protocol: Yes   Plan:  - coumadin 5 mg po x1 - INR in am if not discharged  Kindra Bickham, Tsz-Yin 10/25/2015,8:21 AM

## 2015-10-25 NOTE — Progress Notes (Signed)
[facility] PHYSICAL MEDICINE & REHABILITATION     PROGRESS NOTE  Subjective/Complaints:  Patient lying in bed this morning. She is happy that she is in another room with the windows. She is looking forward going home today.  ROS:  Denies CP, SOB, N/V/D.  Objective: Vital Signs: Blood pressure 149/33, pulse 49, temperature 98.6 F (37 C), temperature source Oral, resp. rate 18, height 5\' 4"  (1.626 m), weight 96.8 kg (213 lb 6.5 oz), SpO2 97 %. No results found.  Recent Labs  10/23/15 0903  WBC 6.3  HGB 8.0*  HCT 26.9*  PLT 185    Recent Labs  10/23/15 0903  NA 139  K 4.7  CL 106  GLUCOSE 175*  BUN 60*  CREATININE 2.75*  CALCIUM 9.0   CBG (last 3)   Recent Labs  10/24/15 1640 10/24/15 2106 10/25/15 0636  GLUCAP 121* 134* 109*    Wt Readings from Last 3 Encounters:  10/25/15 96.8 kg (213 lb 6.5 oz)  10/18/15 109.3 kg (240 lb 15.4 oz)  09/25/15 102.513 kg (226 lb)    Physical Exam:  BP 149/33 mmHg  Pulse 49  Temp(Src) 98.6 F (37 C) (Oral)  Resp 18  Ht 5\' 4"  (1.626 m)  Wt 96.8 kg (213 lb 6.5 oz)  BMI 36.61 kg/m2  SpO2 97% Constitutional: She appears well-developed and well-nourished. No distress.  HENT: Normocephalic and atraumatic.  Eyes: Conjunctivae and EOM are normal.   Cardiovascular: Regular rate and rhythm.Murmur heard. Respiratory: Effort normal. No stridor. No respiratory distress. She has no wheezes.  GI: Soft. Bowel sounds are normal. She exhibits no distension. There is no tenderness.  Musculoskeletal: She exhibits edema In bilateral lower extremities. She exhibits no tenderness.  Neurological: She is alert and oriented.  Motor: B/l UE: 4+/5 proximal to distal B/l LE: 4+/5 proximally, 4+/5 distally Skin: Neck wound with superficial scab. Skin is warm and dry. She is not diaphoretic. No erythema.  Psychiatric: She has a normal mood and affect. Her behavior is normal. Judgment and thought content normal.   Assessment/Plan: 1.  Functional deficits secondary to debility which require 3+ hours per day of interdisciplinary therapy in a comprehensive inpatient rehab setting. Physiatrist is providing close team supervision and 24 hour management of active medical problems listed below. Physiatrist and rehab team continue to assess barriers to discharge/monitor patient progress toward functional and medical goals.  Function:  Bathing Bathing position   Position: Shower  Bathing parts Body parts bathed by patient: Right arm, Left arm, Chest, Abdomen, Front perineal area, Buttocks, Right upper leg, Left upper leg, Right lower leg, Left lower leg, Back Body parts bathed by helper: Right lower leg, Left lower leg  Bathing assist Assist Level: More than reasonable time      Upper Body Dressing/Undressing Upper body dressing   What is the patient wearing?: Pull over shirt/dress     Pull over shirt/dress - Perfomed by patient: Thread/unthread right sleeve, Thread/unthread left sleeve, Put head through opening, Pull shirt over trunk          Upper body assist Assist Level: More than reasonable time   Set up : To obtain clothing/put away  Lower Body Dressing/Undressing Lower body dressing   What is the patient wearing?: Pants, Underwear, Ted Hose, Non-skid slipper socks Underwear - Performed by patient: Thread/unthread right underwear leg, Thread/unthread left underwear leg, Pull underwear up/down   Pants- Performed by patient: Thread/unthread right pants leg, Thread/unthread left pants leg, Pull pants up/down   Non-skid  slipper socks- Performed by patient: Don/doff right sock, Don/doff left sock                 TED Hose - Performed by helper: Don/doff right TED hose, Don/doff left TED hose  Lower body assist Assist for lower body dressing: More than reasonable time (except for A with TED hose)      Toileting Toileting   Toileting steps completed by patient: Adjust clothing prior to toileting, Performs  perineal hygiene, Adjust clothing after toileting Toileting steps completed by helper: Adjust clothing prior to toileting, Adjust clothing after toileting Toileting Assistive Devices: Grab bar or rail  Toileting assist Assist level: More than reasonable time   Transfers Chair/bed transfer   Chair/bed transfer method: Stand pivot, Ambulatory Chair/bed transfer assist level: No Help, no cues, assistive device, takes more than a reasonable amount of time Chair/bed transfer assistive device: Armrests, Librarian, academic     Max distance: 150 Assist level: No help, No cues, assistive device, takes more than a reasonable amount of time   Wheelchair Wheelchair activity did not occur: N/A (pt ambulatory on unit) Type: Manual Max wheelchair distance: 150 Assist Level: Supervision or verbal cues  Cognition Comprehension Comprehension assist level: Follows complex conversation/direction with no assist  Expression Expression assist level: Expresses complex ideas: With extra time/assistive device  Social Interaction Social Interaction assist level: Interacts appropriately 90% of the time - Needs monitoring or encouragement for participation or interaction.  Problem Solving Problem solving assist level: Solves complex 90% of the time/cues < 10% of the time  Memory Memory assist level: Recognizes or recalls 90% of the time/requires cueing < 10% of the time    Medical Problem List and Plan: 1. Abnormality of gait secondary to debility  DC today  2. DVT Prophylaxis/Anticoagulation: Pharmaceutical: Coumadin 3. Pain Management: Hydrocodone prn for shoulder/wrist pain  4. Mood: Stable on Zoloft 100 mg daily. LCSW to follow for evaluation and support.  5. Neuropsych: This patient is capable of making decisions on her own behalf. 6. Skin/Wound Care: Routine care, avoid prolonged pressure and shearing.  Neck wound healing, cont dressing 7. Fluids/Electrolytes/Nutrition: Monitor I/O.    8. NSTEMI/Hx of CAD: Treated medically. On Metoprolol, lipitor, ASA and Bidil 9. Afib with RVR: Monitor HR. Continue amiodarone, metoprolol and coumadin  10. DM type 2: Monitor BS ac/hs. Continue 70/30 insulin bid. Will use SSI for elevated BS.   Will monitor with increased activity and consider adjustments as necessary   Relatively controlled at present CBG (last 3)   Recent Labs  10/24/15 1640 10/24/15 2106 10/25/15 0636  GLUCAP 121* 134* 109*  11. Acute on chronic renal failure:   Continue po lasix bid per nephrology.   Appreciate nephro recs  Will continue to monitor  Cr 2.75 on 5/29 12. HTN: Monitor BP   Continue Norvasc, furosemide, lopressor  Bidil increased to 2 tabs on 5/26   Will monitor with increased activity  Improved over last 48 hours 13. ABLA  Hb 8.0 on 5/29 (stable)  Also with Fe deficiency s/p ferraheme 1 x dose 14. Hypoalbuminemia  Protein supplement started on 5/25 15. Epistaxis  Chronic, control local pressure 16.  LE edema ECHO has normal ej fx, likely from renal issues, pt feels it has improved, cont TEDs  LOS (Days) 7 A FACE TO FACE EVALUATION WAS PERFORMED  Romone Shaff Lorie Phenix 10/25/2015 7:55 AM

## 2015-10-25 NOTE — Progress Notes (Signed)
Patient and family received discharge instructions from Pam Love, PA-C with verbal understanding. Patient discharged to home with family and belongings. 

## 2015-10-26 ENCOUNTER — Telehealth: Payer: Self-pay | Admitting: Cardiovascular Disease

## 2015-10-26 ENCOUNTER — Telehealth: Payer: Self-pay | Admitting: *Deleted

## 2015-10-26 ENCOUNTER — Other Ambulatory Visit: Payer: Self-pay

## 2015-10-26 ENCOUNTER — Encounter: Payer: Medicare Other | Admitting: Adult Health

## 2015-10-26 DIAGNOSIS — I6522 Occlusion and stenosis of left carotid artery: Secondary | ICD-10-CM

## 2015-10-26 DIAGNOSIS — I6523 Occlusion and stenosis of bilateral carotid arteries: Secondary | ICD-10-CM

## 2015-10-26 NOTE — Progress Notes (Signed)
Social Work Patient ID: Briana [name], female   DOB: June 26, 1945, 70 y.o.   MRN: [id number]   CSW met with pt and her dtr to confirm plans set for d/c.  Pt is pleased that team felt she was ready for d/c.  Pt to have home O2 from [place], who she has used in the past and husband uses now.  Pt will also have [facility] to follow her at home for continued therapies.  Pt is modified independent and has other needed DME at home already.  Pt and dtr appreciative of CIR care and she is eager to get home to her husband.

## 2015-10-26 NOTE — Progress Notes (Signed)
Social Work Discharge Note  The overall goal for the admission was met for:   Discharge location: Yes - home  Length of Stay: Yes - 12 days  Discharge activity level: Yes - modified independent  Home/community participation: Yes  Services provided included: MD, RD, PT, OT, RN, Pharmacy, Neuropsych and SW  Financial Services: Medicare and Private Insurance: [place]  Follow-up services arranged: Home Health: RN/PT/OT, DME: oxygen and Patient/Family request agency HH: none - [place], DME: [facility]  Comments (or additional information):  Pt cares for husband who has dementia and is on home O2.  She will have support/assistance as needed from their dtr who lives nearby.  Pt feels ready to go home and is appreciative of CIR care.  Patient/Family verbalized understanding of follow-up arrangements: Yes  Individual responsible for coordination of the follow-up plan: pt with support from dtr  Confirmed correct DME delivered: Briana [name], Briana [name] 10/26/2015    Briana [name], Briana [name]

## 2015-10-26 NOTE — Telephone Encounter (Signed)
pls call [name] @ [phone] w/ [facility] concerning this pt.

## 2015-10-26 NOTE — Patient Care Conference (Signed)
Inpatient RehabilitationTeam Conference and Plan of Care Update Date: 10/25/2015   Time: 2:00 PM    Patient Name: Briana [name]      Medical Record Number: [id number]  Date of Birth: 08/25/1945 Sex: Female         Room/Bed: 4W14C/4W14C-01 Payor Info: Payor: MEDICARE / Plan: MEDICARE PART A AND B / Product Type: *No Product type* /    Admitting Diagnosis: debility  Admit Date/Time:  10/18/2015  5:13 PM Admission Comments: No comment available   Primary Diagnosis:  Debility Principal Problem: Debility  Patient Active Problem List   Diagnosis Date Noted  . Depression   . Hypoalbuminemia due to protein-calorie malnutrition ([place])   . Debility 10/18/2015  . Abnormality of gait   . Long term current use of anticoagulant therapy   . Chronic pain syndrome   . Coronary artery disease involving native coronary artery of native heart without angina pectoris   . Hx of non-ST elevation myocardial infarction (NSTEMI)   . Paroxysmal atrial fibrillation (HCC)   . Diabetes mellitus type 2 in obese ([place])   . Chronic kidney disease (CKD), stage IV (severe) ([place])   . Benign essential HTN   . AKI (acute kidney injury) ([place])   . Non-ST elevation myocardial infarction (NSTEMI) ([place]) 10/14/2015  . Pulmonary edema   . Abnormal nuclear stress test   . Acute renal failure with tubular necrosis ([place])   . Hypertensive emergency   . Atrial fibrillation with rapid ventricular response ([place]) 10/05/2015  . CAP (community acquired pneumonia)   . CAD in native artery   . Pulmonary hypertension ([place])   . Atrial fibrillation with RVR ([place])   . Acute on chronic renal failure ([place])   . Uncontrolled type 2 diabetes mellitus with complication ([place])   . Physical deconditioning   . Acute respiratory failure with hypoxia ([place])   . Acute pulmonary edema (HCC)   . Diastolic CHF, acute on chronic (HCC)   . Acute blood loss anemia   . Controlled diabetes mellitus type 2 with complications ([place])   . Acute  respiratory failure ([place]) 10/01/2015  . Hypoxia 10/01/2015  . Elevated troponin 10/01/2015  . Chest pain   . DVT (deep venous thrombosis) ([place])   . Hypoxemia   . Acute on chronic diastolic CHF (congestive heart failure) ([place]) 05/31/2015  . Essential hypertension, benign 03/28/2015  . Hyperlipidemia 03/28/2015  . Vitamin D deficiency 03/28/2015  . CKD (chronic kidney disease) stage 3, GFR 30-59 ml/min 03/04/2014  . Acute renal failure ([place]) 03/04/2014  . UTI (lower urinary tract infection) 03/02/2014  . Acute encephalopathy 03/02/2014  . Dehydration 03/02/2014  . Generalized weakness 03/02/2014  . Morbid obesity ([place]) 03/02/2014  . Depressed mood 03/02/2014  . Type 2 diabetes mellitus with stage 3 chronic kidney disease ([place]) 03/02/2014  . Hx of adenomatous colonic polyps 01/28/2012  . Occlusion and stenosis of carotid artery without mention of cerebral infarction 12/03/2011    Expected Discharge Date: Expected Discharge Date: 10/25/15  Team Members Present: Physician leading conference: Dr. Delice Lesch Social Worker Present: Alfonse Alpers, LCSW Nurse Present: Junius Creamer, RN PT Present: Jorge Mandril, PT;Caitlin Penven-Crew, PT;Austin Berline Lopes, PT;Victoria Sabra Heck, PT OT Present: Benay Pillow, OT SLP Present: Weston Anna, SLP PPS Coordinator present : Daiva Nakayama, RN, CRRN     Current Status/Progress Goal Weekly Team Focus  Medical   Abnormality of gait secondary to debility with worsened renal function and HTN  Improve mobility, safety, kidney function  see above  Bowel/Bladder   Pt continent of bowel and bladder. LBM 2/29/17,   Remain continent of bowel and bladder with min assist   Offer toileting q 2 hours and prn. Encourage use of stool softners.    Swallow/Nutrition/ Hydration             ADL's   mod I, ready for discharge  mod I  family and pt education completed   Mobility   mod I  mod I  d/c on Wednesday prior to conference   Communication              Safety/Cognition/ Behavioral Observations            Pain   C/o occasional soreness related to arthritis. Norco Q 4 hours prn   <4   Assess pain q4 hours and prn. Medicate when needed    Skin   Skin CDI.   Remain free of skin breakdown and infection   Assess skin q shift and prn. Encourage pressure relief.     Rehab Goals Patient on target to meet rehab goals: Yes Rehab Goals Revised: none *See Care Plan and progress notes for long and short-term goals.  Barriers to Discharge: Safety awareness, pt edu    Possible Resolutions to Barriers:  Pt edu, follow kidney function, renal as outpt    Discharge Planning/Teaching Needs:  Pt to return to her home with her dtr to assist intermittently.  Her husband will be with her regularly.  Pt is modified independent and can direct her care, as needed.   Team Discussion:  Pt progressed well while on CIR to modified independent and was ready for d/c today.  Pt will have good support from her dtr.  She will have f/u [facility] for therapies.  Revisions to Treatment Plan:  none   Continued Need for Acute Rehabilitation Level of Care: The patient requires daily medical management by a physician with specialized training in physical medicine and rehabilitation for the following conditions: Daily direction of a multidisciplinary physical rehabilitation program to ensure safe treatment while eliciting the highest outcome that is of practical value to the patient.: Yes Daily medical management of patient stability for increased activity during participation in an intensive rehabilitation regime.: Yes Daily analysis of laboratory values and/or radiology reports with any subsequent need for medication adjustment of medical intervention for : Cardiac problems;Blood pressure problems;Renal problems  Yazmyn Valbuena, Silvestre Mesi 10/26/2015, 2:09 PM

## 2015-10-26 NOTE — Telephone Encounter (Signed)
[name] called and states that Pt needs Amiodarone 200 mg called into [facility] in [place]. Noted in chart that Rx was sent on 10/24/15. I called [facility] and verified that pt has Rx ready to be picked up.

## 2015-10-26 NOTE — Progress Notes (Signed)
Social Work Patient ID: Briana [name], female   DOB: April 06, 1946, 70 y.o.   MRN: [id number]   Lynnda Child, LCSW Social Worker Signed  Patient Care Conference 10/25/2015 10:57 AM    Expand All Collapse All   Inpatient RehabilitationTeam Conference and Plan of Care Update Date: 10/25/2015   Time: 2:00 PM     Patient Name: Briana [name]       Medical Record Number: [id number]  Date of Birth: Apr 07, 1946 Sex: Female         Room/Bed: 4W14C/4W14C-01 Payor Info: Payor: MEDICARE / Plan: MEDICARE PART A AND B / Product Type: *No Product type* /    Admitting Diagnosis: debility   Admit Date/Time:  10/18/2015  5:13 PM Admission Comments: No comment available   Primary Diagnosis:  Debility Principal Problem: Debility    Patient Active Problem List     Diagnosis  Date Noted   .  Depression     .  Hypoalbuminemia due to protein-calorie malnutrition ([place])     .  Debility  10/18/2015   .  Abnormality of gait     .  Long term current use of anticoagulant therapy     .  Chronic pain syndrome     .  Coronary artery disease involving native coronary artery of native heart without angina pectoris     .  Hx of non-ST elevation myocardial infarction (NSTEMI)     .  Paroxysmal atrial fibrillation (HCC)     .  Diabetes mellitus type 2 in obese ([place])     .  Chronic kidney disease (CKD), stage IV (severe) ([place])     .  Benign essential HTN     .  AKI (acute kidney injury) ([place])     .  Non-ST elevation myocardial infarction (NSTEMI) ([place])  10/14/2015   .  Pulmonary edema     .  Abnormal nuclear stress test     .  Acute renal failure with tubular necrosis ([place])     .  Hypertensive emergency     .  Atrial fibrillation with rapid ventricular response ([place])  10/05/2015   .  CAP (community acquired pneumonia)     .  CAD in native artery     .  Pulmonary hypertension ([place])     .  Atrial fibrillation with RVR ([place])     .  Acute on chronic renal failure ([place])     .  Uncontrolled type 2 diabetes  mellitus with complication ([place])     .  Physical deconditioning     .  Acute respiratory failure with hypoxia ([place])     .  Acute pulmonary edema (HCC)     .  Diastolic CHF, acute on chronic (HCC)     .  Acute blood loss anemia     .  Controlled diabetes mellitus type 2 with complications ([place])     .  Acute respiratory failure ([place])  10/01/2015   .  Hypoxia  10/01/2015   .  Elevated troponin  10/01/2015   .  Chest pain     .  DVT (deep venous thrombosis) ([place])     .  Hypoxemia     .  Acute on chronic diastolic CHF (congestive heart failure) ([place])  05/31/2015   .  Essential hypertension, benign  03/28/2015   .  Hyperlipidemia  03/28/2015   .  Vitamin D deficiency  03/28/2015   .  CKD (chronic kidney disease) stage 3,  GFR 30-59 ml/min  03/04/2014   .  Acute renal failure ([place])  03/04/2014   .  UTI (lower urinary tract infection)  03/02/2014   .  Acute encephalopathy  03/02/2014   .  Dehydration  03/02/2014   .  Generalized weakness  03/02/2014   .  Morbid obesity ([place])  03/02/2014   .  Depressed mood  03/02/2014   .  Type 2 diabetes mellitus with stage 3 chronic kidney disease ([place])  03/02/2014   .  Hx of adenomatous colonic polyps  01/28/2012   .  Occlusion and stenosis of carotid artery without mention of cerebral infarction  12/03/2011     Expected Discharge Date: Expected Discharge Date: 10/25/15  Team Members Present: Physician leading conference: Dr. Delice Lesch Social Worker Present: Alfonse Alpers, LCSW Nurse Present: Junius Creamer, RN PT Present: Jorge Mandril, PT;Caitlin Penven-Crew, PT;Austin Berline Lopes, PT;Victoria Sabra Heck, PT OT Present: Benay Pillow, OT SLP Present: Weston Anna, SLP PPS Coordinator present : Daiva Nakayama, RN, CRRN        Current Status/Progress  Goal  Weekly Team Focus   Medical     Abnormality of gait secondary to debility with worsened renal function and HTN  Improve mobility, safety, kidney function   see above   Bowel/Bladder     Pt continent  of bowel and bladder. LBM 2/29/17,   Remain continent of bowel and bladder with min assist   Offer toileting q 2 hours and prn. Encourage use of stool softners.    Swallow/Nutrition/ Hydration               ADL's     mod I, ready for discharge  mod I  family and pt education completed   Mobility     mod I  mod I  d/c on Wednesday prior to conference    Communication               Safety/Cognition/ Behavioral Observations              Pain     C/o occasional soreness related to arthritis. Norco Q 4 hours prn    <4   Assess pain q4 hours and prn. Medicate when needed    Skin     Skin CDI.   Remain free of skin breakdown and infection   Assess skin q shift and prn. Encourage pressure relief.     Rehab Goals Patient on target to meet rehab goals: Yes Rehab Goals Revised: none *See Care Plan and progress notes for long and short-term goals.    Barriers to Discharge:  Safety awareness, pt edu     Possible Resolutions to Barriers:   Pt edu, follow kidney function, renal as outpt      Discharge Planning/Teaching Needs:   Pt to return to her home with her dtr to assist intermittently.  Her husband will be with her regularly.   Pt is modified independent and can direct her care, as needed.    Team Discussion:    Pt progressed well while on CIR to modified independent and was ready for d/c today.  Pt will have good support from her dtr.  She will have f/u [facility] for therapies.   Revisions to Treatment Plan:    none    Continued Need for Acute Rehabilitation Level of Care: The patient requires daily medical management by a physician with specialized training in physical medicine and rehabilitation for the following conditions: Daily direction of a multidisciplinary physical rehabilitation program  to ensure safe treatment while eliciting the highest outcome that is of practical value to the patient.: Yes Daily medical management of patient stability for increased activity during  participation in an intensive rehabilitation regime.: Yes Daily analysis of laboratory values and/or radiology reports with any subsequent need for medication adjustment of medical intervention for : Cardiac problems;Blood pressure problems;Renal problems  Taylore Hinde, Silvestre Mesi 10/26/2015, 2:09 PM

## 2015-10-27 ENCOUNTER — Ambulatory Visit (INDEPENDENT_AMBULATORY_CARE_PROVIDER_SITE_OTHER): Payer: Medicare Other | Admitting: Adult Health

## 2015-10-27 ENCOUNTER — Encounter: Payer: Self-pay | Admitting: Adult Health

## 2015-10-27 VITALS — BP 170/60 | HR 48 | Ht 63.0 in | Wt 233.0 lb

## 2015-10-27 DIAGNOSIS — I5032 Chronic diastolic (congestive) heart failure: Secondary | ICD-10-CM | POA: Diagnosis not present

## 2015-10-27 DIAGNOSIS — I10 Essential (primary) hypertension: Secondary | ICD-10-CM

## 2015-10-27 DIAGNOSIS — I480 Paroxysmal atrial fibrillation: Secondary | ICD-10-CM | POA: Diagnosis not present

## 2015-10-27 DIAGNOSIS — I6523 Occlusion and stenosis of bilateral carotid arteries: Secondary | ICD-10-CM

## 2015-10-27 NOTE — Patient Instructions (Signed)
Your physician recommends that you schedule a follow-up appointment in: 3 Months with Dr. Bronson Ing  Your physician recommends that you return for lab work in: 1 Month (BMET)  Your physician recommends that you continue on your current medications as directed. Please refer to the Current Medication list given to you today.  If you need a refill on your cardiac medications before your next appointment, please call your pharmacy.  Thank you for choosing [facility]!

## 2015-10-27 NOTE — Progress Notes (Signed)
Name: Briana [name]    DOB: [birth year]  Age: 70 y.o.  MR#: [id number]       PCP:  Antionette Fairy, PA-C      Insurance: Payor: MEDICARE / Plan: MEDICARE PART A AND B / Product Type: *No Product type* /   CC:    Chief Complaint  Patient presents with  . Chest Pain  . Atrial Fibrillation  . Hypertension    VS Filed Vitals:   10/27/15 1514  BP: 170/60  Pulse: 48  Height: 5\' 3"  (1.6 m)  Weight: 233 lb (105.688 kg)  SpO2: 95%    Weights Current Weight  10/27/15 233 lb (105.688 kg)  10/25/15 213 lb 6.5 oz (96.8 kg)  10/18/15 240 lb 15.4 oz (109.3 kg)    Blood Pressure  BP Readings from Last 3 Encounters:  10/27/15 170/60  10/25/15 156/30  10/18/15 146/49     Admit date:  (Not on file) Last encounter with RMR:  Visit date not found   Allergy Carbamazepine; Clonazepam; Gemfibrozil; Macrobid; and Oxycodone  Current Outpatient Prescriptions  Medication Sig Dispense Refill  . amiodarone (PACERONE) 200 MG tablet Take 1 tablet (200 mg total) by mouth daily. 30 tablet 1  . amLODipine (NORVASC) 10 MG tablet Take 1 tablet (10 mg total) by mouth daily. 30 tablet 0  . aspirin EC 81 MG EC tablet Take 1 tablet (81 mg total) by mouth daily. 30 tablet 1  . atorvastatin (LIPITOR) 80 MG tablet Take 1 tablet (80 mg total) by mouth daily. 30 tablet 0  . calcium acetate (PHOSLO) 667 MG capsule Take 2 capsules (1,334 mg total) by mouth 3 (three) times daily with meals. 180 capsule 1  . cetirizine (ZYRTEC) 10 MG tablet Take 10 mg by mouth at bedtime.    . Cholecalciferol (VITAMIN D) 2000 units tablet Take 2,000 Units by mouth daily.    . furosemide (LASIX) 40 MG tablet Take 1 tablet (40 mg total) by mouth daily. 30 tablet 0  . HYDROcodone-acetaminophen (NORCO) 7.5-325 MG tablet Take 1 tablet by mouth every 4 (four) hours as needed for moderate pain (Must last 30 days.  Do not drive or operate machinery while taking this medicine.). 120 tablet 0  . insulin NPH-regular Human (NOVOLIN 70/30)  (70-30) 100 UNIT/ML injection Inject 15 Units into the skin 2 (two) times daily with a meal. 50 mL 0  . isosorbide-hydrALAZINE (BIDIL) 20-37.5 MG tablet Take 2 tablets by mouth 3 (three) times daily. 180 tablet 0  . metoprolol tartrate (LOPRESSOR) 25 MG tablet Take 1 tablet (25 mg total) by mouth 2 (two) times daily. 30 tablet 1  . nitroGLYCERIN (NITROSTAT) 0.4 MG SL tablet Place 1 tablet (0.4 mg total) under the tongue every 5 (five) minutes as needed for chest pain. Max three pills 45 tablet 0  . protein supplement (RESOURCE BENEPROTEIN) POWD Take 6 g by mouth 3 (three) times daily with meals.  0  . sertraline (ZOLOFT) 100 MG tablet Take 1 tablet (100 mg total) by mouth at bedtime.    Marland Kitchen warfarin (COUMADIN) 5 MG tablet Take one pill on Mon, Wed, Fri, Sat, Sun. Take 1/2 pill on Tue and Thus 30 tablet 0   No current facility-administered medications for this visit.    Discontinued Meds:    Medications Discontinued During This Encounter  Medication Reason  . cholecalciferol (VITAMIN D) 1000 UNITS tablet Error  . loratadine (CLARITIN) 10 MG tablet Error    Patient Active Problem List  Diagnosis Date Noted  . Depression   . Hypoalbuminemia due to protein-calorie malnutrition ([place])   . Debility 10/18/2015  . Abnormality of gait   . Long term current use of anticoagulant therapy   . Chronic pain syndrome   . Coronary artery disease involving native coronary artery of native heart without angina pectoris   . Hx of non-ST elevation myocardial infarction (NSTEMI)   . Paroxysmal atrial fibrillation (HCC)   . Diabetes mellitus type 2 in obese ([place])   . Chronic kidney disease (CKD), stage IV (severe) ([place])   . Benign essential HTN   . AKI (acute kidney injury) ([place])   . Non-ST elevation myocardial infarction (NSTEMI) ([place]) 10/14/2015  . Pulmonary edema   . Abnormal nuclear stress test   . Acute renal failure with tubular necrosis ([place])   . Hypertensive emergency   . Atrial fibrillation  with rapid ventricular response ([place]) 10/05/2015  . CAP (community acquired pneumonia)   . CAD in native artery   . Pulmonary hypertension ([place])   . Atrial fibrillation with RVR ([place])   . Acute on chronic renal failure ([place])   . Uncontrolled type 2 diabetes mellitus with complication ([place])   . Physical deconditioning   . Acute respiratory failure with hypoxia ([place])   . Acute pulmonary edema (HCC)   . Diastolic CHF, acute on chronic (HCC)   . Acute blood loss anemia   . Controlled diabetes mellitus type 2 with complications ([place])   . Acute respiratory failure ([place]) 10/01/2015  . Hypoxia 10/01/2015  . Elevated troponin 10/01/2015  . Chest pain   . DVT (deep venous thrombosis) ([place])   . Hypoxemia   . Acute on chronic diastolic CHF (congestive heart failure) ([place]) 05/31/2015  . Essential hypertension, benign 03/28/2015  . Hyperlipidemia 03/28/2015  . Vitamin D deficiency 03/28/2015  . CKD (chronic kidney disease) stage 3, GFR 30-59 ml/min 03/04/2014  . Acute renal failure ([place]) 03/04/2014  . UTI (lower urinary tract infection) 03/02/2014  . Acute encephalopathy 03/02/2014  . Dehydration 03/02/2014  . Generalized weakness 03/02/2014  . Morbid obesity ([place]) 03/02/2014  . Depressed mood 03/02/2014  . Type 2 diabetes mellitus with stage 3 chronic kidney disease ([place]) 03/02/2014  . Hx of adenomatous colonic polyps 01/28/2012  . Occlusion and stenosis of carotid artery without mention of cerebral infarction 12/03/2011    LABS    Component Value Date/Time   NA 139 10/23/2015 0903   NA 141 10/19/2015 0444   NA 139 10/18/2015 0425   K 4.7 10/23/2015 0903   K 4.6 10/19/2015 0444   K 4.4 10/18/2015 0425   CL 106 10/23/2015 0903   CL 105 10/19/2015 0444   CL 106 10/18/2015 0425   CO2 25 10/23/2015 0903   CO2 28 10/19/2015 0444   CO2 25 10/18/2015 0425   GLUCOSE 175* 10/23/2015 0903   GLUCOSE 138* 10/19/2015 0444   GLUCOSE 114* 10/18/2015 0425   BUN 60* 10/23/2015 0903   BUN 54*  10/19/2015 0444   BUN 57* 10/18/2015 0425   CREATININE 2.75* 10/23/2015 0903   CREATININE 2.69* 10/19/2015 0444   CREATININE 2.94* 10/18/2015 0425   CREATININE 2.37* 09/15/2015 1014   CREATININE 1.53* 05/31/2015 1332   CALCIUM 9.0 10/23/2015 0903   CALCIUM 8.8* 10/19/2015 0444   CALCIUM 8.6* 10/18/2015 0425   GFRNONAA 16* 10/23/2015 0903   GFRNONAA 17* 10/19/2015 0444   GFRNONAA 15* 10/18/2015 0425   GFRAA 19* 10/23/2015 0903   GFRAA 20* 10/19/2015 0444  GFRAA 18* 10/18/2015 0425   CMP     Component Value Date/Time   NA 139 10/23/2015 0903   K 4.7 10/23/2015 0903   CL 106 10/23/2015 0903   CO2 25 10/23/2015 0903   GLUCOSE 175* 10/23/2015 0903   BUN 60* 10/23/2015 0903   CREATININE 2.75* 10/23/2015 0903   CREATININE 2.37* 09/15/2015 1014   CALCIUM 9.0 10/23/2015 0903   PROT 5.8* 10/19/2015 0444   ALBUMIN 2.7* 10/19/2015 0444   AST 17 10/19/2015 0444   ALT 18 10/19/2015 0444   ALKPHOS 65 10/19/2015 0444   BILITOT 0.4 10/19/2015 0444   GFRNONAA 16* 10/23/2015 0903   GFRAA 19* 10/23/2015 0903       Component Value Date/Time   WBC 6.3 10/23/2015 0903   WBC 7.6 10/19/2015 0444   WBC 7.6 10/18/2015 0425   HGB 8.0* 10/23/2015 0903   HGB 8.0* 10/19/2015 0444   HGB 7.9* 10/18/2015 0425   HCT 26.9* 10/23/2015 0903   HCT 26.6* 10/19/2015 0444   HCT 26.4* 10/18/2015 0425   MCV 90.9 10/23/2015 0903   MCV 89.0 10/19/2015 0444   MCV 88.3 10/18/2015 0425    Lipid Panel     Component Value Date/Time   CHOL  04/30/2009 0323    142        ATP III CLASSIFICATION:  <200     mg/dL   Desirable  200-239  mg/dL   Borderline High  >=240    mg/dL   High          TRIG 151* 04/30/2009 0323   HDL 41 04/30/2009 0323   CHOLHDL 3.5 04/30/2009 0323   VLDL 30 04/30/2009 0323   LDLCALC  04/30/2009 0323    71        Total Cholesterol/HDL:CHD Risk Coronary Heart Disease Risk Table                     Men   Women  1/2 Average Risk   3.4   3.3  Average Risk       5.0   4.4  2 X  Average Risk   9.6   7.1  3 X Average Risk  23.4   11.0        Use the calculated Patient Ratio above and the CHD Risk Table to determine the patient's CHD Risk.        ATP III CLASSIFICATION (LDL):  <100     mg/dL   Optimal  100-129  mg/dL   Near or Above                    Optimal  130-159  mg/dL   Borderline  160-189  mg/dL   High  >190     mg/dL   Very High    ABG    Component Value Date/Time   PHART 7.368 10/04/2015 0554   PCO2ART 33.4* 10/04/2015 0554   PO2ART 84.5 10/04/2015 0554   HCO3 18.8* 10/04/2015 0554   TCO2 19.8 10/04/2015 0554   ACIDBASEDEF 5.6* 10/04/2015 0554   O2SAT 96.9 10/04/2015 0554     Lab Results  Component Value Date   TSH 1.354 10/04/2015   BNP (last 3 results)  Recent Labs  05/31/15 1522 09/30/15 2140 10/02/15 2204  BNP 391.0* 894.0* 1262.5*    ProBNP (last 3 results) No results for input(s): PROBNP in the last 8760 hours.  Cardiac Panel (last 3 results) No results for input(s): CKTOTAL, CKMB, TROPONINI, RELINDX in  the last 72 hours.  Iron/TIBC/Ferritin/ %Sat    Component Value Date/Time   IRON 34 10/17/2015 1310   TIBC 288 10/17/2015 1310   FERRITIN 171 10/17/2015 1310   IRONPCTSAT 12 10/17/2015 1310     EKG Orders placed or performed during the hospital encounter of 09/30/15  . EKG 12-Lead  . EKG 12-Lead  . EKG 12-Lead  . EKG 12-Lead  . EKG 12-Lead  . EKG 12-Lead  . EKG 12-Lead  . EKG 12-Lead  . EKG 12-Lead  . EKG 12-Lead  . EKG 12-Lead  . EKG 12-Lead  . EKG 12-Lead  . EKG 12-Lead     Prior Assessment and Plan Problem List as of 10/27/2015      Cardiovascular and Mediastinum   Occlusion and stenosis of carotid artery without mention of cerebral infarction   Essential hypertension, benign   Acute on chronic diastolic CHF (congestive heart failure) (HCC)   DVT (deep venous thrombosis) (HCC)   Diastolic CHF, acute on chronic (HCC)   Atrial fibrillation with rapid ventricular response (HCC)   CAD in native  artery   Pulmonary hypertension (HCC)   Atrial fibrillation with RVR (HCC)   Hypertensive emergency   Non-ST elevation myocardial infarction (NSTEMI) ([place])   Coronary artery disease involving native coronary artery of native heart without angina pectoris   Hx of non-ST elevation myocardial infarction (NSTEMI)   Paroxysmal atrial fibrillation (HCC)   Benign essential HTN     Respiratory   Acute respiratory failure (HCC)   Hypoxia   Acute respiratory failure with hypoxia (HCC)   Acute pulmonary edema (HCC)   CAP (community acquired pneumonia)   Pulmonary edema     Endocrine   Type 2 diabetes mellitus with stage 3 chronic kidney disease ([place])   Controlled diabetes mellitus type 2 with complications ([place])   Uncontrolled type 2 diabetes mellitus with complication (HCC)   Diabetes mellitus type 2 in obese ([place])     Nervous and Auditory   Acute encephalopathy     Genitourinary   UTI (lower urinary tract infection)   CKD (chronic kidney disease) stage 3, GFR 30-59 ml/min   Acute renal failure (HCC)   Acute on chronic renal failure (HCC)   Acute renal failure with tubular necrosis (HCC)   AKI (acute kidney injury) ([place])   Chronic kidney disease (CKD), stage IV (severe) ([place])     Other   Hx of adenomatous colonic polyps   Last Assessment & Plan 01/28/2012 Office Visit Edited 01/28/2012 11:59 PM by Andria Meuse, NP    [name] is a pleasant 70 y.o. female due for surveillance colonoscopy for hx multiple adenomatous polyps in 2009.  Colonoscopy w/ Dr Oneida Alar.  I have discussed risks & benefits which include, but are not limited to, bleeding, infection, perforation & drug reaction.  The patient agrees with this plan & written consent will be obtained.  Procedure will need to be done with deep sedation (propofol) in the OR under the direction of anesthesia services for multiple psychoactive medications.  Half insulin (30 units) the day prior to the colonoscopy (date of prep) 1/2  of Januvia & Glucophage in morning & evening the day prior to your colonoscopy (date of prep) Hold diabetes medications day of procedure, early morning appt Bring all your medications and/or any insulin to the hospital the day of the procedure. Follow blood sugars, call us or your PCP if any problems.      Dehydration   Generalized weakness  Morbid obesity ([place])   Depressed mood   Hyperlipidemia   Vitamin D deficiency   Elevated troponin   Chest pain   Hypoxemia   Acute blood loss anemia   Physical deconditioning   Abnormal nuclear stress test   Debility   Abnormality of gait   Long term current use of anticoagulant therapy   Chronic pain syndrome   Hypoalbuminemia due to protein-calorie malnutrition [facility])   Depression       Imaging: US Carotid Duplex Bilateral  09/29/2015  CLINICAL DATA:  Bilateral carotid artery stenosis. EXAM: BILATERAL CAROTID DUPLEX ULTRASOUND TECHNIQUE: Pearline Cables scale imaging, color Doppler and duplex ultrasound were performed of bilateral carotid and vertebral arteries in the neck. COMPARISON:  11/24/2014. FINDINGS: Criteria: Quantification of carotid stenosis is based on velocity parameters that correlate the residual internal carotid diameter with NASCET-based stenosis levels, using the diameter of the distal internal carotid lumen as the denominator for stenosis measurement. The following velocity measurements were obtained: RIGHT ICA:  113/16 cm/sec CCA:  123456 cm/sec SYSTOLIC ICA/CCA RATIO:  1.0 DIASTOLIC ICA/CCA RATIO:  1.5 ECA:  128 cm/sec LEFT ICA:  393/82 cm/sec CCA:  [id number] cm/sec SYSTOLIC ICA/CCA RATIO:  4.6 DIASTOLIC ICA/CCA RATIO:  5.8 ECA:  160 cm/sec RIGHT CAROTID ARTERY: Mild to moderate right carotid bifurcation atherosclerotic vascular disease. No flow limiting stenosis. Degree of stenosis less than 50%. RIGHT VERTEBRAL ARTERY:  Patent with antegrade flow. LEFT CAROTID ARTERY: Prominent left carotid bifurcation and proximal ICA atherosclerotic  vascular plaque. Degree of stenosis greater than 70%. LEFT VERTEBRAL ARTERY:  Patent antegrade flow. IMPRESSION: 1. Greater than 70 degrees stenosis left carotid bifurcation and proximal ICA. Vascular surgery consultation suggested. 2. Atherosclerotic vascular plaque right carotid bifurcation. Degree of stenosis less than 50%. Vertebral arteries are patent antegrade flow. Electronically Signed   By: Marcello Moores  Register   On: 09/29/2015 13:15   Nm Myocar Multi W/spect W/wall Motion / Ef  10/10/2015  CLINICAL DATA:  70 year old with chest pain. EXAM: MYOCARDIAL IMAGING WITH SPECT (REST AND PHARMACOLOGIC-STRESS) GATED LEFT VENTRICULAR WALL MOTION STUDY LEFT VENTRICULAR EJECTION FRACTION TECHNIQUE: Standard myocardial SPECT imaging was performed after resting intravenous injection of 10 mCi Tc-62m tetrofosmin. Subsequently, intravenous infusion of Lexiscan was performed under the supervision of the Cardiology staff. At peak effect of the drug, 30 mCi Tc-48m tetrofosmin was injected intravenously and standard myocardial SPECT imaging was performed. Quantitative gated imaging was also performed to evaluate left ventricular wall motion, and estimate left ventricular ejection fraction. COMPARISON:  Chest radiograph 10/05/2015 FINDINGS: Perfusion: There are fixed defects involving the anterior, anteroseptal and lateral walls. These findings are compatible with areas of infarct. In addition, there is concern for mild reversibility in the mid segments of the anteroseptal wall and anterior wall. These areas are concerning for peri-infarct ischemia. Wall Motion: Dyskinesia at the apex. Left Ventricular Ejection Fraction: 45 % End diastolic volume [id number] End systolic volume 68 ml IMPRESSION: 1. Concern for mild peri-infarct ischemia involving the anterior wall and anteroseptal wall. Evidence for infarcts involving the anterior and lateral walls. 2. Dyskinesia at the apex. 3. Left ventricular ejection fraction is 45%. 4. Non  invasive risk stratification*: Intermediate *2012 Appropriate Use Criteria for Coronary Revascularization Focused Update: J Am Coll Cardiol. [id number]. http://content.[email or web address].aspx?[email or web address] Electronically Signed   By: Markus Daft M.D.   On: 10/10/2015 17:08   US Renal Port  10/05/2015  CLINICAL DATA:  Chronic renal failure with acute exacerbation EXAM: RENAL ULTRASOUND COMPARISON:  September 05, 2008 FINDINGS: Right Kidney: Length: 11.2  cm. Echogenicity within normal limits. Renal cortical thickness is low normal. No mass, perinephric fluid, or hydronephrosis visualized. No sonographically demonstrable calculus or ureterectasis. Left Kidney: Length: 11.4 cm. Echogenicity within normal limits. Renal cortical thickness is low normal. No mass, perinephric fluid, or hydronephrosis visualized. No sonographically demonstrable calculus or ureterectasis. Bladder: Urinary bladder is decompressed with a Foley catheter and cannot be assessed. IMPRESSION: Low normal renal cortical thickness, a finding that may be seen with medical renal disease. No increase in renal echogenicity or evidence of obstructing focus on either side. Study otherwise unremarkable. Electronically Signed   By: Lowella Grip III M.D.   On: 10/05/2015 17:47   Dg Chest Port 1 View  10/11/2015  CLINICAL DATA:  Central line placement. EXAM: PORTABLE CHEST 1 VIEW COMPARISON:  10/05/2015. FINDINGS: 1819 hours. Right IJ central venous catheter has been placed, extending to the mid SVC level. The heart size and mediastinal contours are stable status post CABG. Pulmonary edema appears mildly improved. No pneumothorax or significant pleural effusion identified. IMPRESSION: Central line extends to the mid SVC level. No evidence of pneumothorax. Mild improvement in pulmonary edema. Electronically Signed   By: Richardean Sale M.D.   On: 10/11/2015 18:31   Dg Chest Port 1 View  10/05/2015  CLINICAL DATA:  Pulmonary edema, history  of coronary artery disease, CHF, CABG. Acute respiratory failure with hypoxia. EXAM: PORTABLE CHEST 1 VIEW COMPARISON:  Portable chest x-ray of Oct 04, 2015 FINDINGS: The lungs are well-expanded. The interstitial markings remain increased. The left hemidiaphragm is slightly better demonstrated today but increased retrocardiac density persists. The cardiac silhouette remains enlarged. The pulmonary vascularity is slightly less engorged. There are post CABG changes. IMPRESSION: CHF superimposed upon COPD. Pulmonary interstitial and mild alveolar edema, slightly improved. Left basilar atelectasis or pneumonia and small left pleural effusion. Electronically Signed   By: David  Martinique M.D.   On: 10/05/2015 07:25   Dg Chest Port 1 View  10/04/2015  CLINICAL DATA:  Hypoxia.  Dyspnea EXAM: PORTABLE CHEST 1 VIEW COMPARISON:  10/03/2015 FINDINGS: Airspace opacities in the central and basilar regions have worsened from the earlier study. Probable left pleural effusion. Moderate vascular and interstitial congestion. Moderate cardiomegaly. IMPRESSION: The findings likely represent congestive heart failure. Infectious infiltrates cannot be excluded. Electronically Signed   By: Andreas Newport M.D.   On: 10/04/2015 01:39   Dg Chest Port 1 View  10/03/2015  CLINICAL DATA:  Shortness of breath EXAM: PORTABLE CHEST 1 VIEW COMPARISON:  10/02/2015 FINDINGS: Cardiomegaly again noted. Status post CABG. Patchy airspace disease bilaterally especially lower lobes again noted. Probable small left pleural effusion with left basilar atelectasis or infiltrate. Central mild vascular congestion without convincing pulmonary edema. IMPRESSION: Patchy airspace disease bilaterally especially lower lobes again noted. Probable small left pleural effusion and left basilar atelectasis or infiltrate. Central mild vascular congestion without convincing pulmonary edema. Electronically Signed   By: Lahoma Crocker M.D.   On: 10/03/2015 08:37   Dg Chest  Port 1 View  10/02/2015  CLINICAL DATA:  Acute respiratory failure. EXAM: PORTABLE CHEST 1 VIEW COMPARISON:  10/01/2015 FINDINGS: Airspace opacities persist throughout the central and basilar regions of both lungs, without significant interval change. No pneumothorax. Unchanged cardiomegaly. IMPRESSION: No significant interval change in the bilateral airspace opacities. Electronically Signed   By: Andreas Newport M.D.   On: 10/02/2015 23:10   Dg Chest Port 1 View  10/01/2015  CLINICAL DATA:  Initial evaluation for acute chest pain. EXAM: PORTABLE CHEST 1 VIEW  COMPARISON:  Prior radiograph from 09/30/2015. FINDINGS: Median sternotomy wires with underlying CABG markers again noted. Cardiomegaly stable. Atheromatous plaque within the aortic arch. Lungs mildly hypoinflated with elevation left hemidiaphragm. Interval worsening and patchy right perihilar and basilar opacity, worrisome for progressive pneumonia. Additional patchy opacity at the left lung base may reflect atelectasis or infiltrate. Mild vascular congestion without overt pulmonary edema. No definite pleural effusion, although the right costophrenic angle is incompletely visualized. No pneumothorax. No acute osseus abnormality. IMPRESSION: 1. Interval worsening in patchy right perihilar and basilar opacity, worrisome for progressive pneumonia. 2. Elevation of the left hemidiaphragm with additional patchy left basilar patchy opacity, which may reflect atelectasis or additional infiltrate. 3. Stable cardiomegaly with mild pulmonary vascular congestion without overt pulmonary edema. Electronically Signed   By: Jeannine Boga M.D.   On: 10/01/2015 05:15   Dg Chest Port 1 View  09/30/2015  CLINICAL DATA:  Dyspnea, cough, fever. EXAM: PORTABLE CHEST 1 VIEW COMPARISON:  05/31/2015 FINDINGS: There is unchanged moderate cardiomegaly. There is patchy right base opacity which could represent pneumonia. No large effusion. IMPRESSION: Patchy right lung base  opacity, potentially pneumonia although hemorrhage or asymmetric edema could also produce this appearance. Electronically Signed   By: Andreas Newport M.D.   On: 09/30/2015 22:33

## 2015-10-27 NOTE — Progress Notes (Signed)
Cardiology Office Note   Date:  10/27/2015   ID:  ZYRA CASLIN, DOB 26-Sep-1945, MRN [id number]  PCP:  Antionette Fairy, PA-C  Cardiologist:   Jory Sims, NP   Chief Complaint  Patient presents with  . Chest Pain  . Atrial Fibrillation  . Hypertension      History of Present Illness: Briana [name] is a 70 y.o. female who presents for posthospitalization followup after admission for chest pain, atrial fibrillation with RVR, diastolic CHF, acute pulmonary edema, hypertension, withhistory of DVT, uncontrolled diabetes,acute on chronic renal failure.  Echocardiogram was completed during the hospitalization which revealed:  Left ventricle: The cavity size was normal. There was mild focal  basal hypertrophy of the septum. Systolic function was normal.  The estimated ejection fraction was in the range of 55% to 60%.  Wall motion was normal; there were no regional wall motion  abnormalities. There was a reduced contribution of atrial  contraction to ventricular filling, due to increased ventricular  diastolic pressure or atrial contractile dysfunction. Doppler  parameters are consistent with a reversible restrictive pattern,  indicative of decreased left ventricular diastolic compliance  and/or increased left atrial pressure (grade 3 diastolic  dysfunction). Doppler parameters are consistent with high  ventricular filling pressure. - Aortic valve: Severe diffuse thickening and calcification,  consistent with sclerosis. There was mild regurgitation. Valve  area (VTI): 1.93 cm^2. Valve area (Vmax): 1.83 cm^2. Valve area  (Vmean): 1.97 cm^2. - Mitral valve: Severely calcified annulus. Mild diffuse  calcification, with moderate involvement of chords. There was  mild regurgitation. Valve area by continuity equation (using LVOT  flow): 1.87 cm^2. - Left atrium: The atrium was severely dilated. - Tricuspid valve: There was moderate regurgitation. - Pulmonary  arteries: PA peak pressure: 69 mm Hg (S).  The right ventricular systolic pressure was increased consistent  with severe pulmonary hypertension.  NM cardiac stress test showed anterior defect with concerns for peri infarct ischemia but patient asymptomatic and medical management recommended untill renal function improves.She was seen by cardiology during hospitalization, cardiac catheterization was delayed indefinitely until her kidney function stabilized. PAF was control on amiodarone.after being placed on a diltiazem drip. The patient did undergo  DCCV by Dr. Oval Linsey in 10/09/2015. she was continued on Coumadin, isosorbide, Lasix, and amlodipine.  She was referred to [facility] physical medicine and rehabilitation post-hospitalization.she was discharged from rehabilitation facility on 10/25/2015, had improved and was stable.   She is date feeling well. Should her only complaint is some chronic lower extremity edema she is watching her salt she has been undergoing home rehabilitation with home health nurses. She is getting stronger each day and able to do more of her own ADLs. She denies chest pain dizziness or dyspnea. Her daughter who is with her is very attentive.  Past Medical History  Diagnosis Date  . Diabetes mellitus   . Hypertension   . Hyperlipidemia   . Heart murmur   . Peripheral vascular disease ([place])   . Arthritis   . Depression with anxiety   . Carotid artery occlusion   . Chronic kidney disease   . Neuropathy ([place])   . CAD (coronary artery disease)   . Rheumatic fever   . Sleep apnea   . PAD (peripheral artery disease) ([place])   . Adenomatous colon polyp 09/2007  . Atrial fibrillation with rapid ventricular response ([place]) 10/05/2015    Past Surgical History  Procedure Laterality Date  . Coronary artery bypass graft  2005  . Carotid  endarterectomy  10/04/2009    Right CEA  . Tubal ligation    . Polypectomy  02/25/2012    Procedure: POLYPECTOMY;  Surgeon: Danie Binder,  MD;  Location: [facility];  Service: Endoscopy;  Laterality: N/A;     Current Outpatient Prescriptions  Medication Sig Dispense Refill  . amiodarone (PACERONE) 200 MG tablet Take 1 tablet (200 mg total) by mouth daily. 30 tablet 1  . amLODipine (NORVASC) 10 MG tablet Take 1 tablet (10 mg total) by mouth daily. 30 tablet 0  . aspirin EC 81 MG EC tablet Take 1 tablet (81 mg total) by mouth daily. 30 tablet 1  . atorvastatin (LIPITOR) 80 MG tablet Take 1 tablet (80 mg total) by mouth daily. 30 tablet 0  . calcium acetate (PHOSLO) 667 MG capsule Take 2 capsules (1,334 mg total) by mouth 3 (three) times daily with meals. 180 capsule 1  . cetirizine (ZYRTEC) 10 MG tablet Take 10 mg by mouth at bedtime.    . Cholecalciferol (VITAMIN D) 2000 units tablet Take 2,000 Units by mouth daily.    . furosemide (LASIX) 40 MG tablet Take 1 tablet (40 mg total) by mouth daily. 30 tablet 0  . HYDROcodone-acetaminophen (NORCO) 7.5-325 MG tablet Take 1 tablet by mouth every 4 (four) hours as needed for moderate pain (Must last 30 days.  Do not drive or operate machinery while taking this medicine.). 120 tablet 0  . insulin NPH-regular Human (NOVOLIN 70/30) (70-30) 100 UNIT/ML injection Inject 15 Units into the skin 2 (two) times daily with a meal. 50 mL 0  . isosorbide-hydrALAZINE (BIDIL) 20-37.5 MG tablet Take 2 tablets by mouth 3 (three) times daily. 180 tablet 0  . metoprolol tartrate (LOPRESSOR) 25 MG tablet Take 1 tablet (25 mg total) by mouth 2 (two) times daily. 30 tablet 1  . nitroGLYCERIN (NITROSTAT) 0.4 MG SL tablet Place 1 tablet (0.4 mg total) under the tongue every 5 (five) minutes as needed for chest pain. Max three pills 45 tablet 0  . protein supplement (RESOURCE BENEPROTEIN) POWD Take 6 g by mouth 3 (three) times daily with meals.  0  . sertraline (ZOLOFT) 100 MG tablet Take 1 tablet (100 mg total) by mouth at bedtime.    Marland Kitchen warfarin (COUMADIN) 5 MG tablet Take one pill on Mon, Wed, Fri, Sat, Sun. Take  1/2 pill on Tue and Thus 30 tablet 0   No current facility-administered medications for this visit.    Allergies:   Carbamazepine; Clonazepam; Gemfibrozil; Macrobid; and Oxycodone    Social History:  The patient  reports that she has never smoked. She has never used smokeless tobacco. She reports that she does not drink alcohol or use illicit drugs.   Family History:  The patient's family history includes Breast cancer in her mother; Diabetes in her father; Heart disease in her brother and father.    ROS: All other systems are reviewed and negative. Unless otherwise mentioned in H&P    PHYSICAL EXAM: VS:  BP 170/60 mmHg  Pulse 48  Ht 5\' 3"  (1.6 m)  Wt 233 lb (105.688 kg)  BMI 41.28 kg/m2  SpO2 95% , BMI Body mass index is 41.28 kg/(m^2). GEN: Well nourished, well developed, in no acute distress HEENT: normal Neck: no JVD, carotid bruits, or masses Cardiac: [id number] systolic murmur, R and L sternal boarders,  no rubs, or gallops Respiratory:  Clear to auscultation bilaterally, normal work of breathing GI: soft, nontender, nondistended, + BS MS: no deformity or  atrophyand 1+ pretibial edema in the dependent position Skin: warm and dry, no rash Neuro:  Strength and sensation are intact Psych: euthymic mood, full affect   Recent Labs: 10/02/2015: B Natriuretic Peptide 1262.5* 10/04/2015: TSH 1.354 10/06/2015: Magnesium 2.2 10/19/2015: ALT 18 10/23/2015: BUN 60*; Creatinine, Ser 2.75*; Hemoglobin 8.0*; Platelets 185; Potassium 4.7; Sodium 139    Lipid Panel    Component Value Date/Time   CHOL  04/30/2009 0323    142        ATP III CLASSIFICATION:  <200     mg/dL   Desirable  200-239  mg/dL   Borderline High  >=240    mg/dL   High          TRIG 151* 04/30/2009 0323   HDL 41 04/30/2009 0323   CHOLHDL 3.5 04/30/2009 0323   VLDL 30 04/30/2009 0323   LDLCALC  04/30/2009 0323    71        Total Cholesterol/HDL:CHD Risk Coronary Heart Disease Risk Table                      Men   Women  1/2 Average Risk   3.4   3.3  Average Risk       5.0   4.4  2 X Average Risk   9.6   7.1  3 X Average Risk  23.4   11.0        Use the calculated Patient Ratio above and the CHD Risk Table to determine the patient's CHD Risk.        ATP III CLASSIFICATION (LDL):  <100     mg/dL   Optimal  100-129  mg/dL   Near or Above                    Optimal  130-159  mg/dL   Borderline  160-189  mg/dL   High  >190     mg/dL   Very High      Wt Readings from Last 3 Encounters:  10/27/15 233 lb (105.688 kg)  10/25/15 213 lb 6.5 oz (96.8 kg)  10/18/15 240 lb 15.4 oz (109.3 kg)      ASSESSMENT AND PLAN:  1. Atrial fibrillation: She remains in a regular rhythm, on metoprolol, amiodarone, and warfarin. The patient is being followed by primary care for dosing on anticoagulation.  2. Hypertension: blood pressure is elevated today. Once we reviewed her medications she was noted to be only taking the BiDil  once a day instead of 3 times a day. This has been corrected and her daughter will make sure she takes it 3 times a day. Some of the lower extremity edema could be explained by the amlodipine and the BiDil. I've explained this to her and asked her to keep her legs elevated as much as possible.  3. Chronic diastolic heart failure: Grade 3.continue Lasix 40 mg daily, salt restriction. She does have some lower extremity edema. She is to continue daily weights.  Current medicines are reviewed at length with the patient today.    Labs/ tests ordered today include: BMET No orders of the defined types were placed in this encounter.     Disposition:   FU with 3 months with Dr. Bronson Ing Signed, Jory Sims, NP  10/27/2015 3:25 PM    [facility]. [address] [place], [place] [address] Phone: [phone]; Fax: [phone]

## 2015-10-30 ENCOUNTER — Other Ambulatory Visit (HOSPITAL_COMMUNITY)
Admission: RE | Admit: 2015-10-30 | Discharge: 2015-10-30 | Disposition: A | Discharge status: Home / Self Care | Payer: Medicare Other | Source: Other Acute Inpatient Hospital | Attending: Physical Medicine & Rehabilitation | Admitting: Physical Medicine & Rehabilitation

## 2015-10-30 DIAGNOSIS — E119 Type 2 diabetes mellitus without complications: Secondary | ICD-10-CM | POA: Diagnosis present

## 2015-10-30 LAB — RENAL FUNCTION PANEL
ANION GAP: 7 (ref 5–15)
Albumin: 3.3 g/dL — ABNORMAL LOW (ref 3.5–5.0)
BUN: 49 mg/dL — ABNORMAL HIGH (ref 6–20)
CALCIUM: 8.6 mg/dL — AB (ref 8.9–10.3)
CHLORIDE: 106 mmol/L (ref 101–111)
CO2: 26 mmol/L (ref 22–32)
Creatinine, Ser: 2.42 mg/dL — ABNORMAL HIGH (ref 0.44–1.00)
GFR calc non Af Amer: 19 mL/min — ABNORMAL LOW (ref >60)
GFR, EST AFRICAN AMERICAN: 22 mL/min — AB (ref >60)
GLUCOSE: 105 mg/dL — AB (ref 65–99)
Phosphorus: 4.5 mg/dL (ref 2.5–4.6)
Potassium: 4.2 mmol/L (ref 3.5–5.1)
SODIUM: 139 mmol/L (ref 135–145)

## 2015-11-10 ENCOUNTER — Inpatient Hospital Stay (HOSPITAL_COMMUNITY)
Admission: EM | Admit: 2015-11-10 | Discharge: 2015-12-10 | DRG: 264 | Disposition: A | Discharge status: Home Health | Payer: Medicare Other | Attending: Internal Medicine | Admitting: Internal Medicine

## 2015-11-10 ENCOUNTER — Encounter (HOSPITAL_COMMUNITY): Payer: Self-pay

## 2015-11-10 ENCOUNTER — Emergency Department (HOSPITAL_COMMUNITY): Payer: Medicare Other

## 2015-11-10 DIAGNOSIS — R001 Bradycardia, unspecified: Secondary | ICD-10-CM | POA: Diagnosis not present

## 2015-11-10 DIAGNOSIS — Z833 Family history of diabetes mellitus: Secondary | ICD-10-CM | POA: Diagnosis not present

## 2015-11-10 DIAGNOSIS — N186 End stage renal disease: Secondary | ICD-10-CM | POA: Diagnosis present

## 2015-11-10 DIAGNOSIS — E871 Hypo-osmolality and hyponatremia: Secondary | ICD-10-CM | POA: Diagnosis not present

## 2015-11-10 DIAGNOSIS — Z881 Allergy status to other antibiotic agents status: Secondary | ICD-10-CM | POA: Diagnosis not present

## 2015-11-10 DIAGNOSIS — Z888 Allergy status to other drugs, medicaments and biological substances status: Secondary | ICD-10-CM

## 2015-11-10 DIAGNOSIS — I10 Essential (primary) hypertension: Secondary | ICD-10-CM | POA: Insufficient documentation

## 2015-11-10 DIAGNOSIS — Z794 Long term (current) use of insulin: Secondary | ICD-10-CM | POA: Diagnosis not present

## 2015-11-10 DIAGNOSIS — N184 Chronic kidney disease, stage 4 (severe): Secondary | ICD-10-CM | POA: Diagnosis not present

## 2015-11-10 DIAGNOSIS — I252 Old myocardial infarction: Secondary | ICD-10-CM

## 2015-11-10 DIAGNOSIS — F418 Other specified anxiety disorders: Secondary | ICD-10-CM | POA: Diagnosis present

## 2015-11-10 DIAGNOSIS — E785 Hyperlipidemia, unspecified: Secondary | ICD-10-CM | POA: Diagnosis present

## 2015-11-10 DIAGNOSIS — G2581 Restless legs syndrome: Secondary | ICD-10-CM | POA: Diagnosis present

## 2015-11-10 DIAGNOSIS — Z885 Allergy status to narcotic agent status: Secondary | ICD-10-CM | POA: Diagnosis not present

## 2015-11-10 DIAGNOSIS — D509 Iron deficiency anemia, unspecified: Secondary | ICD-10-CM | POA: Diagnosis present

## 2015-11-10 DIAGNOSIS — Z6835 Body mass index (BMI) 35.0-35.9, adult: Secondary | ICD-10-CM | POA: Diagnosis not present

## 2015-11-10 DIAGNOSIS — I480 Paroxysmal atrial fibrillation: Secondary | ICD-10-CM | POA: Diagnosis present

## 2015-11-10 DIAGNOSIS — I5033 Acute on chronic diastolic (congestive) heart failure: Secondary | ICD-10-CM | POA: Diagnosis present

## 2015-11-10 DIAGNOSIS — E1169 Type 2 diabetes mellitus with other specified complication: Secondary | ICD-10-CM | POA: Diagnosis not present

## 2015-11-10 DIAGNOSIS — I083 Combined rheumatic disorders of mitral, aortic and tricuspid valves: Secondary | ICD-10-CM | POA: Diagnosis present

## 2015-11-10 DIAGNOSIS — I132 Hypertensive heart and chronic kidney disease with heart failure and with stage 5 chronic kidney disease, or end stage renal disease: Principal | ICD-10-CM | POA: Diagnosis present

## 2015-11-10 DIAGNOSIS — E1122 Type 2 diabetes mellitus with diabetic chronic kidney disease: Secondary | ICD-10-CM | POA: Diagnosis present

## 2015-11-10 DIAGNOSIS — N2581 Secondary hyperparathyroidism of renal origin: Secondary | ICD-10-CM | POA: Diagnosis present

## 2015-11-10 DIAGNOSIS — E118 Type 2 diabetes mellitus with unspecified complications: Secondary | ICD-10-CM | POA: Diagnosis present

## 2015-11-10 DIAGNOSIS — E875 Hyperkalemia: Secondary | ICD-10-CM | POA: Diagnosis not present

## 2015-11-10 DIAGNOSIS — Z7982 Long term (current) use of aspirin: Secondary | ICD-10-CM | POA: Diagnosis not present

## 2015-11-10 DIAGNOSIS — R0600 Dyspnea, unspecified: Secondary | ICD-10-CM | POA: Diagnosis present

## 2015-11-10 DIAGNOSIS — I5032 Chronic diastolic (congestive) heart failure: Secondary | ICD-10-CM | POA: Diagnosis present

## 2015-11-10 DIAGNOSIS — I770 Arteriovenous fistula, acquired: Secondary | ICD-10-CM | POA: Diagnosis not present

## 2015-11-10 DIAGNOSIS — E1151 Type 2 diabetes mellitus with diabetic peripheral angiopathy without gangrene: Secondary | ICD-10-CM | POA: Diagnosis present

## 2015-11-10 DIAGNOSIS — Z8249 Family history of ischemic heart disease and other diseases of the circulatory system: Secondary | ICD-10-CM

## 2015-11-10 DIAGNOSIS — E669 Obesity, unspecified: Secondary | ICD-10-CM | POA: Diagnosis present

## 2015-11-10 DIAGNOSIS — R0902 Hypoxemia: Secondary | ICD-10-CM | POA: Diagnosis present

## 2015-11-10 DIAGNOSIS — N185 Chronic kidney disease, stage 5: Secondary | ICD-10-CM | POA: Diagnosis not present

## 2015-11-10 DIAGNOSIS — N179 Acute kidney failure, unspecified: Secondary | ICD-10-CM | POA: Diagnosis not present

## 2015-11-10 DIAGNOSIS — Z452 Encounter for adjustment and management of vascular access device: Secondary | ICD-10-CM

## 2015-11-10 DIAGNOSIS — Z951 Presence of aortocoronary bypass graft: Secondary | ICD-10-CM

## 2015-11-10 DIAGNOSIS — N189 Chronic kidney disease, unspecified: Secondary | ICD-10-CM

## 2015-11-10 DIAGNOSIS — E860 Dehydration: Secondary | ICD-10-CM | POA: Diagnosis not present

## 2015-11-10 DIAGNOSIS — E1159 Type 2 diabetes mellitus with other circulatory complications: Secondary | ICD-10-CM | POA: Diagnosis present

## 2015-11-10 DIAGNOSIS — D631 Anemia in chronic kidney disease: Secondary | ICD-10-CM | POA: Diagnosis present

## 2015-11-10 DIAGNOSIS — I4891 Unspecified atrial fibrillation: Secondary | ICD-10-CM | POA: Diagnosis not present

## 2015-11-10 DIAGNOSIS — E1140 Type 2 diabetes mellitus with diabetic neuropathy, unspecified: Secondary | ICD-10-CM | POA: Diagnosis present

## 2015-11-10 DIAGNOSIS — I2510 Atherosclerotic heart disease of native coronary artery without angina pectoris: Secondary | ICD-10-CM | POA: Diagnosis present

## 2015-11-10 DIAGNOSIS — N289 Disorder of kidney and ureter, unspecified: Secondary | ICD-10-CM

## 2015-11-10 DIAGNOSIS — Z992 Dependence on renal dialysis: Secondary | ICD-10-CM

## 2015-11-10 DIAGNOSIS — I509 Heart failure, unspecified: Secondary | ICD-10-CM

## 2015-11-10 DIAGNOSIS — N183 Chronic kidney disease, stage 3 (moderate): Secondary | ICD-10-CM

## 2015-11-10 DIAGNOSIS — E441 Mild protein-calorie malnutrition: Secondary | ICD-10-CM | POA: Diagnosis present

## 2015-11-10 DIAGNOSIS — Z7901 Long term (current) use of anticoagulants: Secondary | ICD-10-CM

## 2015-11-10 LAB — GLUCOSE, CAPILLARY: Glucose-Capillary: 208 mg/dL — ABNORMAL HIGH (ref 65–99)

## 2015-11-10 LAB — CBC WITH DIFFERENTIAL/PLATELET
BASOS ABS: 0 10*3/uL (ref 0.0–0.1)
Basophils Relative: 0 %
EOS PCT: 3 %
Eosinophils Absolute: 0.3 10*3/uL (ref 0.0–0.7)
HEMATOCRIT: 31.3 % — AB (ref 36.0–46.0)
Hemoglobin: 9.2 g/dL — ABNORMAL LOW (ref 12.0–15.0)
Lymphocytes Relative: 19 %
Lymphs Abs: 1.7 10*3/uL (ref 0.7–4.0)
MCH: 26.4 pg (ref 26.0–34.0)
MCHC: 29.4 g/dL — ABNORMAL LOW (ref 30.0–36.0)
MCV: 89.9 fL (ref 78.0–100.0)
Monocytes Absolute: 0.6 10*3/uL (ref 0.1–1.0)
Monocytes Relative: 7 %
Neutro Abs: 6.4 10*3/uL (ref 1.7–7.7)
Neutrophils Relative %: 71 %
PLATELETS: 229 10*3/uL (ref 150–400)
RBC: 3.48 MIL/uL — ABNORMAL LOW (ref 3.87–5.11)
RDW: 16.7 % — AB (ref 11.5–15.5)
WBC: 9.1 10*3/uL (ref 4.0–10.5)

## 2015-11-10 LAB — COMPREHENSIVE METABOLIC PANEL
ALK PHOS: 63 U/L (ref 38–126)
ALT: 15 U/L (ref 14–54)
AST: 16 U/L (ref 15–41)
Albumin: 3.5 g/dL (ref 3.5–5.0)
Anion gap: 10 (ref 5–15)
BILIRUBIN TOTAL: 0.4 mg/dL (ref 0.3–1.2)
BUN: 56 mg/dL — AB (ref 6–20)
CALCIUM: 9.1 mg/dL (ref 8.9–10.3)
CO2: 21 mmol/L — ABNORMAL LOW (ref 22–32)
CREATININE: 2.45 mg/dL — AB (ref 0.44–1.00)
Chloride: 107 mmol/L (ref 101–111)
GFR calc Af Amer: 22 mL/min — ABNORMAL LOW (ref >60)
GFR, EST NON AFRICAN AMERICAN: 19 mL/min — AB (ref >60)
Glucose, Bld: 138 mg/dL — ABNORMAL HIGH (ref 65–99)
Potassium: 4.8 mmol/L (ref 3.5–5.1)
Sodium: 138 mmol/L (ref 135–145)
TOTAL PROTEIN: 6.6 g/dL (ref 6.5–8.1)

## 2015-11-10 LAB — CBG MONITORING, ED: Glucose-Capillary: 220 mg/dL — ABNORMAL HIGH (ref 65–99)

## 2015-11-10 LAB — URINALYSIS, ROUTINE W REFLEX MICROSCOPIC
BILIRUBIN URINE: NEGATIVE
Glucose, UA: NEGATIVE mg/dL
HGB URINE DIPSTICK: NEGATIVE
Ketones, ur: NEGATIVE mg/dL
NITRITE: NEGATIVE
PH: 5.5 (ref 5.0–8.0)
Protein, ur: 100 mg/dL — AB
SPECIFIC GRAVITY, URINE: 1.013 (ref 1.005–1.030)

## 2015-11-10 LAB — URINE MICROSCOPIC-ADD ON
BACTERIA UA: NONE SEEN
RBC / HPF: NONE SEEN RBC/hpf (ref 0–5)

## 2015-11-10 LAB — TROPONIN I: Troponin I: 0.13 ng/mL — ABNORMAL HIGH (ref <0.031)

## 2015-11-10 LAB — BRAIN NATRIURETIC PEPTIDE: B Natriuretic Peptide: 668.4 pg/mL — ABNORMAL HIGH (ref 0.0–100.0)

## 2015-11-10 LAB — D-DIMER, QUANTITATIVE (NOT AT ARMC): D DIMER QUANT: 1 ug{FEU}/mL — AB (ref 0.00–0.50)

## 2015-11-10 LAB — PROTIME-INR
INR: 2.55 — ABNORMAL HIGH (ref 0.00–1.49)
PROTHROMBIN TIME: 27.1 s — AB (ref 11.6–15.2)

## 2015-11-10 MED ORDER — HYDROCODONE-ACETAMINOPHEN 7.5-325 MG PO TABS
1.0000 | ORAL_TABLET | ORAL | Status: DC | PRN
  Administered 2015-11-11 – 2015-12-09 (×38): 1 via ORAL
  Filled 2015-11-10 (×42): qty 1

## 2015-11-10 MED ORDER — AMIODARONE HCL 200 MG PO TABS
200.0000 mg | ORAL_TABLET | Freq: Every day | ORAL | Status: DC
  Administered 2015-11-11 – 2015-12-10 (×29): 200 mg via ORAL
  Filled 2015-11-10 (×29): qty 1

## 2015-11-10 MED ORDER — WARFARIN SODIUM 5 MG PO TABS
5.0000 mg | ORAL_TABLET | Freq: Once | ORAL | Status: AC
  Administered 2015-11-11: 5 mg via ORAL
  Filled 2015-11-10: qty 1

## 2015-11-10 MED ORDER — CALCIUM ACETATE (PHOS BINDER) 667 MG PO CAPS
1334.0000 mg | ORAL_CAPSULE | Freq: Three times a day (TID) | ORAL | Status: DC
  Administered 2015-11-11 – 2015-11-27 (×50): 1334 mg via ORAL
  Filled 2015-11-10 (×49): qty 2

## 2015-11-10 MED ORDER — INSULIN ASPART PROT & ASPART (70-30 MIX) 100 UNIT/ML ~~LOC~~ SUSP
15.0000 [IU] | Freq: Two times a day (BID) | SUBCUTANEOUS | Status: DC
  Administered 2015-11-11 – 2015-12-10 (×49): 15 [IU] via SUBCUTANEOUS
  Filled 2015-11-10 (×4): qty 10

## 2015-11-10 MED ORDER — FUROSEMIDE 10 MG/ML IJ SOLN
40.0000 mg | Freq: Once | INTRAMUSCULAR | Status: AC
  Administered 2015-11-10: 40 mg via INTRAVENOUS
  Filled 2015-11-10: qty 4

## 2015-11-10 MED ORDER — METOPROLOL TARTRATE 25 MG PO TABS
25.0000 mg | ORAL_TABLET | Freq: Two times a day (BID) | ORAL | Status: DC
  Administered 2015-11-11 – 2015-11-21 (×16): 25 mg via ORAL
  Filled 2015-11-10 (×19): qty 1

## 2015-11-10 MED ORDER — ONDANSETRON HCL 4 MG/2ML IJ SOLN: 4.0000 mg | Freq: Four times a day (QID) | INTRAMUSCULAR | Status: DC | PRN

## 2015-11-10 MED ORDER — ATORVASTATIN CALCIUM 80 MG PO TABS
80.0000 mg | ORAL_TABLET | Freq: Every day | ORAL | Status: DC
  Administered 2015-11-11 – 2015-12-10 (×29): 80 mg via ORAL
  Filled 2015-11-10 (×29): qty 1

## 2015-11-10 MED ORDER — WARFARIN - PHARMACIST DOSING INPATIENT
Freq: Every day | Status: DC
  Administered 2015-11-15: 18:00:00
  Administered 2015-11-17 (×2): 1
  Administered 2015-11-20 – 2015-11-26 (×6)

## 2015-11-10 MED ORDER — SODIUM CHLORIDE 0.9% FLUSH
3.0000 mL | Freq: Two times a day (BID) | INTRAVENOUS | Status: DC
  Administered 2015-11-11 – 2015-12-10 (×50): 3 mL via INTRAVENOUS

## 2015-11-10 MED ORDER — AMLODIPINE BESYLATE 10 MG PO TABS
10.0000 mg | ORAL_TABLET | Freq: Every day | ORAL | Status: DC
  Administered 2015-11-11 – 2015-11-27 (×16): 10 mg via ORAL
  Filled 2015-11-10 (×16): qty 1

## 2015-11-10 MED ORDER — FUROSEMIDE 10 MG/ML IJ SOLN
40.0000 mg | Freq: Two times a day (BID) | INTRAMUSCULAR | Status: DC
  Administered 2015-11-11 – 2015-11-12 (×3): 40 mg via INTRAVENOUS
  Filled 2015-11-10 (×3): qty 4

## 2015-11-10 MED ORDER — ACETAMINOPHEN 325 MG PO TABS: 650.0000 mg | ORAL_TABLET | ORAL | Status: DC | PRN

## 2015-11-10 MED ORDER — SODIUM CHLORIDE 0.9 % IV SOLN: 250.0000 mL | INTRAVENOUS | Status: DC | PRN

## 2015-11-10 MED ORDER — BENEPROTEIN PO POWD
1.0000 | Freq: Three times a day (TID) | ORAL | Status: DC
  Administered 2015-11-11 – 2015-12-10 (×71): 6 g via ORAL
  Filled 2015-11-10 (×3): qty 227

## 2015-11-10 MED ORDER — POTASSIUM CHLORIDE CRYS ER 20 MEQ PO TBCR
20.0000 meq | EXTENDED_RELEASE_TABLET | Freq: Every day | ORAL | Status: DC
  Administered 2015-11-11 – 2015-11-13 (×4): 20 meq via ORAL
  Filled 2015-11-10 (×4): qty 1

## 2015-11-10 MED ORDER — ASPIRIN EC 81 MG PO TBEC
81.0000 mg | DELAYED_RELEASE_TABLET | Freq: Every day | ORAL | Status: DC
  Administered 2015-11-11 – 2015-11-13 (×4): 81 mg via ORAL
  Filled 2015-11-10 (×4): qty 1

## 2015-11-10 MED ORDER — SODIUM CHLORIDE 0.9% FLUSH
3.0000 mL | INTRAVENOUS | Status: DC | PRN
  Administered 2015-11-11 – 2015-11-24 (×3): 3 mL via INTRAVENOUS
  Filled 2015-11-10 (×3): qty 3

## 2015-11-10 MED ORDER — ISOSORB DINITRATE-HYDRALAZINE 20-37.5 MG PO TABS
2.0000 | ORAL_TABLET | Freq: Three times a day (TID) | ORAL | Status: DC
  Administered 2015-11-11 – 2015-11-27 (×46): 2 via ORAL
  Filled 2015-11-10 (×50): qty 2

## 2015-11-10 NOTE — ED Notes (Signed)
Pt okay'd by MD to take home meds missed at lunchtime.  Pt taking: 2 Calcium Acetate (667mg ) Metoprolol (25mg ) Isorbide Dinitrate (x2 20mg )

## 2015-11-10 NOTE — ED Notes (Signed)
MD at bedside. 

## 2015-11-10 NOTE — ED Notes (Signed)
When pt stands up to the bedside commode on 3L [place] O2 saturations drop to 78%.  Pt recovers quickly once back in bed.

## 2015-11-10 NOTE — H&P (Signed)
History and Physical    HEATHERLY WEES [id number] DOB: [birth year] DOA: 11/10/2015   PCP: Antionette Fairy, PA-C Chief Complaint:  Chief Complaint  Patient presents with  . Leg Swelling  . Hypoxia with Standing     HPI: Briana [name] is a 70 y.o. female with medical history significant of CHF (grade 3 diastolic dysfunction), DM, HTN, CKD now stage 4.  She was just admitted last month for multi week (5/6-5/24) hospital stay that started with treatment in the ICU for PNA, followed by diuresis for CHF exacerbation.  This was then complicated further by A.Fib RVR which was unstable and required cardioversion.  Around that time she also had AKF develop that thankfully would resolve.  Also had NSTEMI with trop elevation during that stay that was managed medically, no cath due to kidney function.  For a full recounting of the hospital stay please see Dr. Ledell Peoples discharge summary dated 10/18/15.  Following the hospital stay she was in rehab here at [facility].  Patient presents to the ED today with 2 week history of gradually worsening SOB, peripheral edema, orthopnea, DOE.  She saw PCP earlier this week and was advised to start taking lasix BID (had been on 40mg  PO Qday since hospital discharge), but has only done this for 2 days thus far.  No CP, nausea, fever, vomiting, chills, etc.  ED Course: Found to be fluid overloaded with peripheral edema and CHF on CXR.  Given lasix 40mg  IV.  Review of Systems: As per HPI otherwise 10 point review of systems negative.    Past Medical History  Diagnosis Date  . Diabetes mellitus   . Hypertension   . Hyperlipidemia   . Heart murmur   . Peripheral vascular disease ([place])   . Arthritis   . Depression with anxiety   . Carotid artery occlusion   . Chronic kidney disease   . Neuropathy ([place])   . CAD (coronary artery disease)   . Rheumatic fever   . Sleep apnea   . PAD (peripheral artery disease) ([place])   . Adenomatous colon polyp 09/2007  . Atrial  fibrillation with rapid ventricular response ([place]) 10/05/2015    Past Surgical History  Procedure Laterality Date  . Coronary artery bypass graft  2005  . Carotid endarterectomy  10/04/2009    Right CEA  . Tubal ligation    . Polypectomy  02/25/2012    Procedure: POLYPECTOMY;  Surgeon: Danie Binder, MD;  Location: [facility];  Service: Endoscopy;  Laterality: N/A;     reports that she has never smoked. She has never used smokeless tobacco. She reports that she does not drink alcohol or use illicit drugs.  Allergies  Allergen Reactions  . Carbamazepine Other (See Comments)    Reaction to tegretol - loopy  . Clonazepam Other (See Comments)    Unknown reaction  . Gemfibrozil Other (See Comments)    Patient is not aware of this allergy but states that she has side effects to a cholesterol medication in the past  . Macrobid [Nitrofurantoin] Nausea And Vomiting  . Oxycodone Other (See Comments)    hallucinations    Family History  Problem Relation Age of Onset  . Breast cancer Mother   . Heart disease Father   . Diabetes Father   . Heart disease Brother       Prior to Admission medications   Medication Sig Start Date End Date Taking? Authorizing Provider  amiodarone (PACERONE) 200 MG tablet Take 1 tablet (  200 mg total) by mouth daily. 10/24/15  Yes Ivan Anchors Love, PA-C  amLODipine (NORVASC) 10 MG tablet Take 1 tablet (10 mg total) by mouth daily. 10/24/15  Yes Bary Leriche, PA-C  aspirin EC 81 MG EC tablet Take 1 tablet (81 mg total) by mouth daily. 10/18/15  Yes Reyne Dumas, MD  atorvastatin (LIPITOR) 80 MG tablet Take 1 tablet (80 mg total) by mouth daily. 10/24/15  Yes Ivan Anchors Love, PA-C  calcium acetate (PHOSLO) 667 MG capsule Take 2 capsules (1,334 mg total) by mouth 3 (three) times daily with meals. 10/24/15  Yes Ivan Anchors Love, PA-C  cetirizine (ZYRTEC) 10 MG tablet Take 10 mg by mouth at bedtime.   Yes Historical Provider, MD  Cholecalciferol (VITAMIN D) 2000 units tablet Take  2,000 Units by mouth daily.   Yes Historical Provider, MD  furosemide (LASIX) 40 MG tablet Take 1 tablet (40 mg total) by mouth daily. Patient taking differently: Take 40 mg by mouth 2 (two) times daily.  10/24/15  Yes Ivan Anchors Love, PA-C  HYDROcodone-acetaminophen (NORCO) 7.5-325 MG tablet Take 1 tablet by mouth every 4 (four) hours as needed for moderate pain (Must last 30 days.  Do not drive or operate machinery while taking this medicine.). 08/17/15  Yes Sanjuana Kava, MD  insulin NPH-regular Human (NOVOLIN 70/30) (70-30) 100 UNIT/ML injection Inject 15 Units into the skin 2 (two) times daily with a meal. 10/24/15  Yes Ivan Anchors Love, PA-C  isosorbide-hydrALAZINE (BIDIL) 20-37.5 MG tablet Take 2 tablets by mouth 3 (three) times daily. 10/24/15  Yes Ivan Anchors Love, PA-C  metoprolol tartrate (LOPRESSOR) 25 MG tablet Take 1 tablet (25 mg total) by mouth 2 (two) times daily. 10/24/15  Yes Ivan Anchors Love, PA-C  nitroGLYCERIN (NITROSTAT) 0.4 MG SL tablet Place 1 tablet (0.4 mg total) under the tongue every 5 (five) minutes as needed for chest pain. Max three pills 10/24/15  Yes Ivan Anchors Love, PA-C  potassium chloride SA (K-DUR,KLOR-CON) 20 MEQ tablet Take 20 mEq by mouth daily.   Yes Historical Provider, MD  protein supplement (RESOURCE BENEPROTEIN) POWD Take 6 g by mouth 3 (three) times daily with meals. 10/24/15  Yes Ivan Anchors Love, PA-C  warfarin (COUMADIN) 5 MG tablet Take one pill on Mon, Wed, Fri, Sat, Sun. Take 1/2 pill on Tue and Thus Patient taking differently: 2.5-5 mg See admin instructions. Take one pill on Mon, Wed, Fri, Sat, Sun. Take 1/2 pill on Tue and Thus 10/25/15  Yes Bary Leriche, PA-C    Physical Exam: Filed Vitals:   11/10/15 1745 11/10/15 1752 11/10/15 1800 11/10/15 1930  BP:   209/64 170/48  Pulse:   64 59  Temp:      TempSrc:      Resp:   19 22  Weight:      SpO2: 88% 91% 91% 90%      Constitutional: NAD, calm, comfortable Eyes: PERRL, lids and conjunctivae normal ENMT:  Mucous membranes are moist. Posterior pharynx clear of any exudate or lesions.Normal dentition.  Neck: normal, supple, no masses, no thyromegaly Respiratory: clear to auscultation bilaterally, no wheezing, no crackles. Normal respiratory effort. No accessory muscle use.  Cardiovascular: Regular rate and rhythm, no murmurs / rubs / gallops. No extremity edema. 2+ pedal pulses. No carotid bruits.  Abdomen: no tenderness, no masses palpated. No hepatosplenomegaly. Bowel sounds positive.  Musculoskeletal: no clubbing / cyanosis. No joint deformity upper and lower extremities. Good ROM, no contractures. Normal muscle tone.  Skin:  no rashes, lesions, ulcers. No induration Neurologic: CN 2-12 grossly intact. Sensation intact, DTR normal. Strength 5/5 in all 4.  Psychiatric: Normal judgment and insight. Alert and oriented x 3. Normal mood.    Labs on Admission: I have personally reviewed following labs and imaging studies  CBC:  Recent Labs Lab 11/10/15 1647  WBC 9.1  NEUTROABS 6.4  HGB 9.2*  HCT 31.3*  MCV 89.9  PLT [id number]   Basic Metabolic Panel:  Recent Labs Lab 11/10/15 1647  NA 138  K 4.8  CL 107  CO2 21*  GLUCOSE 138*  BUN 56*  CREATININE 2.45*  CALCIUM 9.1   GFR: Estimated Creatinine Clearance: 25.2 mL/min (by C-G formula based on Cr of 2.45). Liver Function Tests:  Recent Labs Lab 11/10/15 1647  AST 16  ALT 15  ALKPHOS 63  BILITOT 0.4  PROT 6.6  ALBUMIN 3.5   No results for input(s): LIPASE, AMYLASE in the last 168 hours. No results for input(s): AMMONIA in the last 168 hours. Coagulation Profile:  Recent Labs Lab 11/10/15 1852  INR 2.55*   Cardiac Enzymes:  Recent Labs Lab 11/10/15 1647  TROPONINI 0.13*   BNP (last 3 results) No results for input(s): PROBNP in the last 8760 hours. HbA1C: No results for input(s): HGBA1C in the last 72 hours. CBG: No results for input(s): GLUCAP in the last 168 hours. Lipid Profile: No results for input(s):  CHOL, HDL, LDLCALC, TRIG, CHOLHDL, LDLDIRECT in the last 72 hours. Thyroid Function Tests: No results for input(s): TSH, T4TOTAL, FREET4, T3FREE, THYROIDAB in the last 72 hours. Anemia Panel: No results for input(s): VITAMINB12, FOLATE, FERRITIN, TIBC, IRON, RETICCTPCT in the last 72 hours. Urine analysis:    Component Value Date/Time   COLORURINE YELLOW 11/10/2015 1521   APPEARANCEUR CLEAR 11/10/2015 1521   LABSPEC 1.013 11/10/2015 1521   PHURINE 5.5 11/10/2015 1521   GLUCOSEU NEGATIVE 11/10/2015 1521   HGBUR NEGATIVE 11/10/2015 1521   BILIRUBINUR NEGATIVE 11/10/2015 1521   KETONESUR NEGATIVE 11/10/2015 1521   PROTEINUR 100* 11/10/2015 1521   UROBILINOGEN 0.2 03/02/2014 1508   NITRITE NEGATIVE 11/10/2015 1521   LEUKOCYTESUR TRACE* 11/10/2015 1521   Sepsis Labs: @LABRCNTIP (procalcitonin:4,lacticidven:4) )No results found for this or any previous visit (from the past 240 hour(s)).   Radiological Exams on Admission: Dg Chest 2 View  11/10/2015  CLINICAL DATA:  Shortness of breath EXAM: CHEST  2 VIEW COMPARISON:  10/11/2015 FINDINGS: Chronic cardiopericardial enlargement. Dialysis catheter has been removed. Prominence of the pulmonary arterial contour, chronic. Pulmonary venous congestion that is similar to prior. Trace left pleural effusion. Focal opacity at the medial right base with new obscuration of the medial diaphragm. This has a streaky appearance in the lateral projection. Prior median sternotomy for CABG. IMPRESSION: Mild CHF. Atelectasis or bronchopneumonia at the medial right base. Electronically Signed   By: Monte Fantasia M.D.   On: 11/10/2015 15:12    EKG: Independently reviewed.  Assessment/Plan Principal Problem:   Acute on chronic diastolic (congestive) heart failure (HCC) Active Problems:   Controlled diabetes mellitus type 2 with complications (HCC)   CAD in native artery   Chronic kidney disease (CKD), stage IV (severe) (HCC)    Acute on chronic diastolic  CHF -  CHF pathway  Lasix 40mg  IV BID ordered for now, will try to very gently diurese while watching kidney function closely  Daily BMP  Likely warrants cardiology consult in AM given the complexity of diuresing in CKD stage 4  CKD stage 4 -  chronic and likely baseline, see above, no ACEi  CAD - NSTEMI last admit  Slight trop elevation in ED, will trend, but no CP right now  Managing medically to avoid heart cath given her CKD  DM2 -  Continue home 70/30 for now  CBG checks AC/HS  H/o A.Fib -  Continue coumadin  Continue rhythm control with amiodarone  Patient got unstable when she went into A.Fib last admit, required DCCV    DVT prophylaxis: Coumadin Code Status: Full Family Communication: Family at bedside Consults called: None Admission status: Admit to inpatient   Etta Quill DO [facility] Pager [phone] from 7PM-7AM  If 7AM-7PM, please contact the day physician for the patient [email or web address] Password [email or web address]  11/10/2015, 8:50 PM

## 2015-11-10 NOTE — ED Notes (Signed)
Attempted report x1. 

## 2015-11-10 NOTE — ED Notes (Signed)
Per Pt, Pt has a Homehealth nurse that visits her. Pt reports oxygen saturation dropping with standing and walking to the 70s. Pt reports SOB since Wednesday. Pt on Chronic oxygen starting May 6th. Leg Swelling noted bilaterally. Daughter reports mother eating salty sandwich from [employer] in the past week.

## 2015-11-10 NOTE — ED Provider Notes (Signed)
CSN: [id number]     Arrival date & time 11/10/15  1350 History   First MD Initiated Contact with Patient 11/10/15 1608     Chief Complaint  Patient presents with  . Leg Swelling  . Hypoxia with Standing      (Consider location/radiation/quality/duration/timing/severity/associated sxs/prior Treatment) HPI   Briana [name] is a 70 y.o. female who presents for evaluation of shortness of breath, orthopnea, and peripheral edema. Onset symptoms gradually over the last 2 weeks. She saw her PCP, earlier this week and was advised to start taking Lasix twice a day. He began that 2 days ago. She did not take her midday medicines prior to coming here today, but otherwise took her usual medications today. She denies chest pain, nausea, vomiting, fever, chills, focal weakness or paresthesia. She was hospitalized for a prolonged period last month, with pneumonia complicated by respiratory failure, and STEMI, A. fib with RVR, and persistent hypoxia. She had a one-week rehabilitation stay following the hospitalization. She denies any palpitations, near-syncope or syncope. She is eating well. There's been no cough. There are no other known modifying factors.   Past Medical History  Diagnosis Date  . Diabetes mellitus   . Hypertension   . Hyperlipidemia   . Heart murmur   . Peripheral vascular disease ([place])   . Arthritis   . Depression with anxiety   . Carotid artery occlusion   . Chronic kidney disease   . Neuropathy ([place])   . CAD (coronary artery disease)   . Rheumatic fever   . Sleep apnea   . PAD (peripheral artery disease) ([place])   . Adenomatous colon polyp 09/2007  . Atrial fibrillation with rapid ventricular response ([facility]) 10/05/2015   Past Surgical History  Procedure Laterality Date  . Coronary artery bypass graft  2005  . Carotid endarterectomy  10/04/2009    Right CEA  . Tubal ligation    . Polypectomy  02/25/2012    Procedure: POLYPECTOMY;  Surgeon: Danie Binder, MD;  Location: [facility];  Service: Endoscopy;  Laterality: N/A;   Family History  Problem Relation Age of Onset  . Breast cancer Mother   . Heart disease Father   . Diabetes Father   . Heart disease Brother    Social History  Substance Use Topics  . Smoking status: Never Smoker   . Smokeless tobacco: Never Used  . Alcohol Use: No   OB History    No data available     Review of Systems  All other systems reviewed and are negative.     Allergies  Carbamazepine; Clonazepam; Gemfibrozil; Macrobid; and Oxycodone  Home Medications   Prior to Admission medications   Medication Sig Start Date End Date Taking? Authorizing Provider  amiodarone (PACERONE) 200 MG tablet Take 1 tablet (200 mg total) by mouth daily. 10/24/15  Yes Ivan Anchors Love, PA-C  amLODipine (NORVASC) 10 MG tablet Take 1 tablet (10 mg total) by mouth daily. 10/24/15  Yes Bary Leriche, PA-C  aspirin EC 81 MG EC tablet Take 1 tablet (81 mg total) by mouth daily. 10/18/15  Yes Reyne Dumas, MD  atorvastatin (LIPITOR) 80 MG tablet Take 1 tablet (80 mg total) by mouth daily. 10/24/15  Yes Ivan Anchors Love, PA-C  calcium acetate (PHOSLO) 667 MG capsule Take 2 capsules (1,334 mg total) by mouth 3 (three) times daily with meals. 10/24/15  Yes Ivan Anchors Love, PA-C  cetirizine (ZYRTEC) 10 MG tablet Take 10 mg by mouth at bedtime.  Yes Historical Provider, MD  Cholecalciferol (VITAMIN D) 2000 units tablet Take 2,000 Units by mouth daily.   Yes Historical Provider, MD  furosemide (LASIX) 40 MG tablet Take 1 tablet (40 mg total) by mouth daily. Patient taking differently: Take 40 mg by mouth 2 (two) times daily.  10/24/15  Yes Ivan Anchors Love, PA-C  HYDROcodone-acetaminophen (NORCO) 7.5-325 MG tablet Take 1 tablet by mouth every 4 (four) hours as needed for moderate pain (Must last 30 days.  Do not drive or operate machinery while taking this medicine.). 08/17/15  Yes Sanjuana Kava, MD  insulin NPH-regular Human (NOVOLIN 70/30) (70-30) 100 UNIT/ML injection  Inject 15 Units into the skin 2 (two) times daily with a meal. 10/24/15  Yes Ivan Anchors Love, PA-C  isosorbide-hydrALAZINE (BIDIL) 20-37.5 MG tablet Take 2 tablets by mouth 3 (three) times daily. 10/24/15  Yes Ivan Anchors Love, PA-C  metoprolol tartrate (LOPRESSOR) 25 MG tablet Take 1 tablet (25 mg total) by mouth 2 (two) times daily. 10/24/15  Yes Ivan Anchors Love, PA-C  nitroGLYCERIN (NITROSTAT) 0.4 MG SL tablet Place 1 tablet (0.4 mg total) under the tongue every 5 (five) minutes as needed for chest pain. Max three pills 10/24/15  Yes Ivan Anchors Love, PA-C  potassium chloride SA (K-DUR,KLOR-CON) 20 MEQ tablet Take 20 mEq by mouth daily.   Yes Historical Provider, MD  protein supplement (RESOURCE BENEPROTEIN) POWD Take 6 g by mouth 3 (three) times daily with meals. 10/24/15  Yes Ivan Anchors Love, PA-C  warfarin (COUMADIN) 5 MG tablet Take one pill on Mon, Wed, Fri, Sat, Sun. Take 1/2 pill on Tue and Thus Patient taking differently: 2.5-5 mg See admin instructions. Take one pill on Mon, Wed, Fri, Sat, Sun. Take 1/2 pill on Tue and Thus 10/25/15  Yes Ivan Anchors Love, PA-C  sertraline (ZOLOFT) 100 MG tablet Take 1 tablet (100 mg total) by mouth at bedtime. Patient not taking: Reported on 11/10/2015 10/24/15   Ivan Anchors Love, PA-C   BP 209/64 mmHg  Pulse 64  Temp(Src) 98.8 F (37.1 C) (Oral)  Resp 19  Wt 239 lb (108.41 kg)  SpO2 91% Physical Exam  Constitutional: She is oriented to person, place, and time. She appears well-developed and well-nourished.  Obese  HENT:  Head: Normocephalic and atraumatic.  Right Ear: External ear normal.  Left Ear: External ear normal.  Eyes: Conjunctivae and EOM are normal. Pupils are equal, round, and reactive to light.  Neck: Normal range of motion and phonation normal. Neck supple.  Cardiovascular: Normal rate, regular rhythm and normal heart sounds.   Pulmonary/Chest: Effort normal and breath sounds normal. No respiratory distress. She exhibits no bony tenderness.  Few  scattered rales. Somewhat decreased air movement bases bilaterally.  Abdominal: Soft. She exhibits no distension. There is no tenderness.  Musculoskeletal: Normal range of motion. She exhibits edema (3+ bilateral lower legs).  Neurological: She is alert and oriented to person, place, and time. No cranial nerve deficit or sensory deficit. She exhibits normal muscle tone. Coordination normal.  Skin: Skin is warm, dry and intact.  Psychiatric: She has a normal mood and affect. Her behavior is normal. Judgment and thought content normal.  Nursing note and vitals reviewed.   ED Course  Procedures (including critical care time)  Initial clinical impression- CHF exacerbation, with chronic renal insufficiency. We'll give IV dose of Lasix, and reassess.  Medications  furosemide (LASIX) injection 40 mg (40 mg Intravenous Given 11/10/15 1729)    Patient Vitals for the past  24 hrs:  BP Temp Temp src Pulse Resp SpO2 Weight  11/10/15 1800 (!) 209/64 mmHg - - 64 19 91 % -  11/10/15 1752 - - - - - 91 % -  11/10/15 1745 - - - - - (!) 88 % -  11/10/15 1721 - - - - - - 239 lb (108.41 kg)  11/10/15 1715 193/58 mmHg - - 61 20 91 % -  11/10/15 1646 190/59 mmHg - - 61 21 (!) 88 % -  11/10/15 1600 177/55 mmHg - - (!) 54 20 94 % -  11/10/15 1530 179/56 mmHg - - (!) 59 18 (!) 89 % -  11/10/15 1354 190/61 mmHg 98.8 F (37.1 C) Oral (!) 56 22 90 % -      6:40 PM Reevaluation with update and discussion. After initial assessment and treatment, an updated evaluation reveals She continues to be somewhat uncomfortable, with pursed lip breathing on expiration, and nervous about her condition. Weight is 3 kg above her recent baseline. Arlene Genova L   6:58 PM-Consult complete with Hospitalist. Patient case explained and discussed. He agrees to admit patient for further evaluation and treatment. Call ended at 20:27   Labs Review Labs Reviewed  COMPREHENSIVE METABOLIC PANEL - Abnormal; Notable for the following:     CO2 21 (*)    Glucose, Bld 138 (*)    BUN 56 (*)    Creatinine, Ser 2.45 (*)    GFR calc non Af Amer 19 (*)    GFR calc Af Amer 22 (*)    All other components within normal limits  CBC WITH DIFFERENTIAL/PLATELET - Abnormal; Notable for the following:    RBC 3.48 (*)    Hemoglobin 9.2 (*)    HCT 31.3 (*)    MCHC 29.4 (*)    RDW 16.7 (*)    All other components within normal limits  BRAIN NATRIURETIC PEPTIDE - Abnormal; Notable for the following:    B Natriuretic Peptide 668.4 (*)    All other components within normal limits  TROPONIN I - Abnormal; Notable for the following:    Troponin I 0.13 (*)    All other components within normal limits  URINALYSIS, ROUTINE W REFLEX MICROSCOPIC (NOT AT [facility]) - Abnormal; Notable for the following:    Protein, ur 100 (*)    Leukocytes, UA TRACE (*)    All other components within normal limits  URINE MICROSCOPIC-ADD ON - Abnormal; Notable for the following:    Squamous Epithelial / LPF 0-5 (*)    All other components within normal limits  D-DIMER, QUANTITATIVE (NOT AT [facility])  PROTIME-INR    Imaging Review Dg Chest 2 View  11/10/2015  CLINICAL DATA:  Shortness of breath EXAM: CHEST  2 VIEW COMPARISON:  10/11/2015 FINDINGS: Chronic cardiopericardial enlargement. Dialysis catheter has been removed. Prominence of the pulmonary arterial contour, chronic. Pulmonary venous congestion that is similar to prior. Trace left pleural effusion. Focal opacity at the medial right base with new obscuration of the medial diaphragm. This has a streaky appearance in the lateral projection. Prior median sternotomy for CABG. IMPRESSION: Mild CHF. Atelectasis or bronchopneumonia at the medial right base. Electronically Signed   By: Monte Fantasia M.D.   On: 11/10/2015 15:12   I have personally reviewed and evaluated these images and lab results as part of my medical decision-making.   EKG Interpretation   Date/Time:  Friday November 10 2015 13:59:06 EDT Ventricular  Rate:  58 PR Interval:  192 QRS Duration: 154  QT Interval:  500 QTC Calculation: 490 R Axis:   90 Text Interpretation:  Sinus bradycardia Right bundle branch block Abnormal  ECG since last tracing no significant change Confirmed by Eulis Foster  MD,  Vira Agar [id number]) on 11/10/2015 4:09:45 PM      MDM   Final diagnoses:  Dyspnea  Hypoxia  Renal insufficiency  Acute on chronic congestive heart failure, unspecified congestive heart failure type (HCC)    Multifactorial shortness of breath, with debilitated state, and worsening condition over the last 10 days time. She will require admission for close monitoring while she is diuresed. She may need pulmonary consultation, for evaluation and treatment.   Nursing Notes Reviewed/ Care Coordinated Applicable Imaging Reviewed Interpretation of Laboratory Data incorporated into ED treatment  Plan: Admit    Daleen Bo, MD 11/10/15 2027

## 2015-11-10 NOTE — Progress Notes (Signed)
ANTICOAGULATION CONSULT NOTE - Initial Consult  Pharmacy Consult for warfarin Indication: atrial fibrillation  Allergies  Allergen Reactions  . Carbamazepine Other (See Comments)    Reaction to tegretol - loopy  . Clonazepam Other (See Comments)    Unknown reaction  . Gemfibrozil Other (See Comments)    Patient is not aware of this allergy but states that she has side effects to a cholesterol medication in the past  . Macrobid [Nitrofurantoin] Nausea And Vomiting  . Oxycodone Other (See Comments)    hallucinations    Patient Measurements: Weight: 239 lb (108.41 kg) Heparin Dosing Weight:   Vital Signs: Temp: 98.8 F (37.1 C) (06/16 1354) Temp Source: Oral (06/16 1354) BP: 170/48 mmHg (06/16 1930) Pulse Rate: 59 (06/16 1930)  Labs:  Recent Labs  11/10/15 1647 11/10/15 1852  HGB 9.2*  --   HCT 31.3*  --   PLT 229  --   LABPROT  --  27.1*  INR  --  2.55*  CREATININE 2.45*  --   TROPONINI 0.13*  --     Estimated Creatinine Clearance: 25.2 mL/min (by C-G formula based on Cr of 2.45).   Medical History: Past Medical History  Diagnosis Date  . Diabetes mellitus   . Hypertension   . Hyperlipidemia   . Heart murmur   . Peripheral vascular disease ([place])   . Arthritis   . Depression with anxiety   . Carotid artery occlusion   . Chronic kidney disease   . Neuropathy ([place])   . CAD (coronary artery disease)   . Rheumatic fever   . Sleep apnea   . PAD (peripheral artery disease) ([place])   . Adenomatous colon polyp 09/2007  . Atrial fibrillation with rapid ventricular response ([place]) 10/05/2015    Medications:  Scheduled:  . amiodarone  200 mg Oral Daily  . [START ON 11/11/2015] amLODipine  10 mg Oral Daily  . aspirin EC  81 mg Oral Daily  . atorvastatin  80 mg Oral Daily  . [START ON 11/11/2015] calcium acetate  1,334 mg Oral TID WC  . [START ON 11/11/2015] furosemide  40 mg Intravenous BID  . [START ON 11/11/2015] insulin aspart protamine- aspart  15 Units  Subcutaneous BID WC  . isosorbide-hydrALAZINE  2 tablet Oral TID  . metoprolol tartrate  25 mg Oral BID  . potassium chloride SA  20 mEq Oral Daily  . [START ON 11/11/2015] protein supplement  1 scoop Oral TID WC  . sodium chloride flush  3 mL Intravenous Q12H  . [START ON 11/11/2015] warfarin  5 mg Oral ONCE-1800  . [START ON 11/11/2015] Warfarin - Pharmacist Dosing Inpatient   Does not apply q1800    Assessment: warfarin for Hx Afib. Last dose 6/15. Hgb 9.2, PLT wnl. INR 2.55  PTA: 5mg  daily except 2.5mg  on Tuesday and Thursday  Goal of Therapy:  INR 2-3 Monitor platelets by anticoagulation protocol: Yes   Plan:  Warfarin 5mg  *1 tonight Daily INR Follow for signs and symptoms of bleeding  Melburn Popper, PharmD Clinical Pharmacy Resident Pager: [phone] 11/10/2015 9:02 PM

## 2015-11-10 NOTE — ED Notes (Signed)
Pt/daughter concerned with pt's vitals (especially BP).  Is requesting that patient be allowed to take home meds.  Will speak to MD

## 2015-11-10 NOTE — ED Notes (Signed)
Pt eating and saturations are low (88%).  Pts O2 upped to 5L while eating.

## 2015-11-10 NOTE — ED Notes (Signed)
CBG is 220.

## 2015-11-10 NOTE — ED Notes (Signed)
MD made aware of pt's continued low SpO2 saturations.  Sts okay to leave pt on [place] at this time.

## 2015-11-10 NOTE — ED Notes (Signed)
Admitting at bedside 

## 2015-11-11 ENCOUNTER — Encounter (HOSPITAL_COMMUNITY): Payer: Self-pay | Admitting: Rehabilitation

## 2015-11-11 LAB — GLUCOSE, CAPILLARY
GLUCOSE-CAPILLARY: 129 mg/dL — AB (ref 65–99)
GLUCOSE-CAPILLARY: 133 mg/dL — AB (ref 65–99)
GLUCOSE-CAPILLARY: 183 mg/dL — AB (ref 65–99)
Glucose-Capillary: 135 mg/dL — ABNORMAL HIGH (ref 65–99)

## 2015-11-11 LAB — TROPONIN I
Troponin I: 0.03 ng/mL (ref <0.031)
Troponin I: 0.03 ng/mL (ref <0.031)
Troponin I: 0.03 ng/mL (ref <0.031)
Troponin I: 0.03 ng/mL (ref <0.031)

## 2015-11-11 LAB — BASIC METABOLIC PANEL
Anion gap: 7 (ref 5–15)
BUN: 53 mg/dL — AB (ref 6–20)
CHLORIDE: 106 mmol/L (ref 101–111)
CO2: 24 mmol/L (ref 22–32)
Calcium: 8.7 mg/dL — ABNORMAL LOW (ref 8.9–10.3)
Creatinine, Ser: 2.38 mg/dL — ABNORMAL HIGH (ref 0.44–1.00)
GFR calc Af Amer: 23 mL/min — ABNORMAL LOW (ref >60)
GFR calc non Af Amer: 20 mL/min — ABNORMAL LOW (ref >60)
Glucose, Bld: 155 mg/dL — ABNORMAL HIGH (ref 65–99)
POTASSIUM: 4.7 mmol/L (ref 3.5–5.1)
SODIUM: 137 mmol/L (ref 135–145)

## 2015-11-11 LAB — PROTIME-INR
INR: 2.83 — ABNORMAL HIGH (ref 0.00–1.49)
PROTHROMBIN TIME: 29.3 s — AB (ref 11.6–15.2)

## 2015-11-11 NOTE — Progress Notes (Signed)
PROGRESS NOTE    Briana [name]  [id number]  DOB: Mar 30, 1946  DOA: 11/10/2015 PCP: [name] Outpatient Specialists:   Hospital course: Briana [name] is a 70 y.o. female with medical history significant of CHF (grade 3 diastolic dysfunction), DM, HTN, CKD now stage 4. She was just admitted last month for multi week (5/6-5/24) hospital stay that started with treatment in the ICU for PNA, followed by diuresis for CHF exacerbation. This was then complicated further by A.Fib RVR which was unstable and required cardioversion. Around that time she also had AKF develop that thankfully would resolve. Also had NSTEMI with trop elevation during that stay that was managed medically, no cath due to kidney function. For a full recounting of the hospital stay please see Dr. Ledell Peoples discharge summary dated 10/18/15. Following the hospital stay she was in rehab here at [facility].  Patient presents to the ED today with 2 week history of gradually worsening SOB, peripheral edema, orthopnea, DOE. She saw PCP earlier this week and was advised to start taking lasix BID (had been on 40mg  PO Qday since hospital discharge), but has only done this for 2 days thus far. No CP, nausea, fever, vomiting, chills, etc.  ED Course: Found to be fluid overloaded with peripheral edema and CHF on CXR. Given lasix 40mg  IV.  Assessment & Plan:   Acute on chronic diastolic CHF - CHF pathway Lasix 40mg  IV BID ordered for now, will try to very gently diurese while watching kidney function closely Daily BMP Pt is down nearly 3 Liters of fluid today  CKD stage 4 - chronic; had received HD during last hospitalization; see above, no ACEi, Renal function holding stable for now, following.   CAD - NSTEMI last recent admission Troponin neg for ischemia, no chest pain, no EKG changes Managing CAD medically to avoid heart cath given her  CKD  DM2 - Continue home 70/30 for now CBG checks AC/HS  H/o A.Fib - Continue coumadin per pharmacy Continue rhythm control with amiodarone Patient got unstable when she went into A.Fib last admission, required DCCV  DVT prophylaxis: Coumadin Code Status: Full Family Communication: Family at bedside Consults called: PT  Admission status: Admit to inpatient   Subjective: Pt feeling much better today, she is diuresing well, sitting up in chair  Objective: Filed Vitals:   11/11/15 0108 11/11/15 0602 11/11/15 0902 11/11/15 1224  BP: 168/46 159/51 167/44 145/46  Pulse: 62 51 52 51  Temp: 98.8 F (37.1 C) 98.2 F (36.8 C) 97.9 F (36.6 C) 98.2 F (36.8 C)  TempSrc: Oral Oral Oral Oral  Resp: 18 18  18   Height:      Weight:  235 lb 9.6 oz (106.867 kg)    SpO2: 93% 94% 95% 95%    Intake/Output Summary (Last 24 hours) at 11/11/15 1355 Last data filed at 11/11/15 1225  Gross per 24 hour  Intake      0 ml  Output   2550 ml  Net  -2550 ml   Filed Weights   11/10/15 1721 11/10/15 2201 11/11/15 0602  Weight: 239 lb (108.41 kg) 236 lb 12.8 oz (107.412 kg) 235 lb 9.6 oz (106.867 kg)    Exam: Constitutional: NAD, calm, comfortable Eyes: PERRL, lids and conjunctivae normal ENMT: Mucous membranes are moist. Posterior pharynx clear of any exudate or lesions.Normal dentition.  Neck: normal, supple, no masses, no thyromegaly Respiratory: bibasilar crackles heard.  Cardiovascular: Regular rate and rhythm, no murmurs / rubs / gallops. No  extremity edema. 2+ pedal pulses. No carotid bruits.  Abdomen: no tenderness, no masses palpated. No hepatosplenomegaly. Bowel sounds positive.  Musculoskeletal: no clubbing / cyanosis. No joint deformity upper and lower extremities. Good ROM, no contractures. Normal muscle tone.  Skin: no rashes, lesions, ulcers. No induration Neurologic: CN 2-12 grossly intact. Sensation intact,  DTR normal. Strength 5/5 in all 4.  Psychiatric: Normal judgment and insight. Alert and oriented x 3. Normal mood.   Data Reviewed: Basic Metabolic Panel:  Recent Labs Lab 11/10/15 1647 11/11/15 0327  NA 138 137  K 4.8 4.7  CL 107 106  CO2 21* 24  GLUCOSE 138* 155*  BUN 56* 53*  CREATININE 2.45* 2.38*  CALCIUM 9.1 8.7*   Liver Function Tests:  Recent Labs Lab 11/10/15 1647  AST 16  ALT 15  ALKPHOS 63  BILITOT 0.4  PROT 6.6  ALBUMIN 3.5   No results for input(s): LIPASE, AMYLASE in the last 168 hours. No results for input(s): AMMONIA in the last 168 hours. CBC:  Recent Labs Lab 11/10/15 1647  WBC 9.1  NEUTROABS 6.4  HGB 9.2*  HCT 31.3*  MCV 89.9  PLT 229   Cardiac Enzymes:  Recent Labs Lab 11/10/15 1647 11/10/15 2124 11/11/15 0327 11/11/15 0832  TROPONINI 0.13* <0.03 0.03 0.03   BNP (last 3 results) No results for input(s): PROBNP in the last 8760 hours. CBG:  Recent Labs Lab 11/10/15 2135 11/10/15 2231 11/11/15 0700 11/11/15 1131  GLUCAP 220* 208* 129* 133*    No results found for this or any previous visit (from the past 240 hour(s)).   Studies: Dg Chest 2 View  11/10/2015  CLINICAL DATA:  Shortness of breath EXAM: CHEST  2 VIEW COMPARISON:  10/11/2015 FINDINGS: Chronic cardiopericardial enlargement. Dialysis catheter has been removed. Prominence of the pulmonary arterial contour, chronic. Pulmonary venous congestion that is similar to prior. Trace left pleural effusion. Focal opacity at the medial right base with new obscuration of the medial diaphragm. This has a streaky appearance in the lateral projection. Prior median sternotomy for CABG. IMPRESSION: Mild CHF. Atelectasis or bronchopneumonia at the medial right base. Electronically Signed   By: Monte Fantasia M.D.   On: 11/10/2015 15:12   Scheduled Meds: . amiodarone  200 mg Oral Daily  . amLODipine  10 mg Oral Daily  . aspirin EC  81 mg Oral Daily  . atorvastatin  80 mg Oral Daily   . calcium acetate  1,334 mg Oral TID WC  . furosemide  40 mg Intravenous BID  . insulin aspart protamine- aspart  15 Units Subcutaneous BID WC  . isosorbide-hydrALAZINE  2 tablet Oral TID  . metoprolol tartrate  25 mg Oral BID  . potassium chloride SA  20 mEq Oral Daily  . protein supplement  1 scoop Oral TID WC  . sodium chloride flush  3 mL Intravenous Q12H  . warfarin  5 mg Oral ONCE-1800  . Warfarin - Pharmacist Dosing Inpatient   Does not apply q1800   Continuous Infusions:   Principal Problem:   Acute on chronic diastolic (congestive) heart failure (HCC) Active Problems:   Controlled diabetes mellitus type 2 with complications (HCC)   CAD in native artery   Chronic kidney disease (CKD), stage IV (severe) (HCC)  Irwin Brakeman, MD, FAAFP [facility] Pager [phone]  If 7PM-7AM, please contact night-coverage [email or web address] Password [email or web address] 11/11/2015, 1:55 PM    LOS: 1 day

## 2015-11-12 LAB — BASIC METABOLIC PANEL
ANION GAP: 10 (ref 5–15)
BUN: 66 mg/dL — ABNORMAL HIGH (ref 6–20)
CALCIUM: 8.6 mg/dL — AB (ref 8.9–10.3)
CO2: 25 mmol/L (ref 22–32)
Chloride: 103 mmol/L (ref 101–111)
Creatinine, Ser: 2.45 mg/dL — ABNORMAL HIGH (ref 0.44–1.00)
GFR calc Af Amer: 22 mL/min — ABNORMAL LOW (ref >60)
GFR, EST NON AFRICAN AMERICAN: 19 mL/min — AB (ref >60)
GLUCOSE: 125 mg/dL — AB (ref 65–99)
POTASSIUM: 5 mmol/L (ref 3.5–5.1)
SODIUM: 138 mmol/L (ref 135–145)

## 2015-11-12 LAB — TROPONIN I: Troponin I: 0.03 ng/mL (ref <0.031)

## 2015-11-12 LAB — PROTIME-INR
INR: 2.47 — ABNORMAL HIGH (ref 0.00–1.49)
PROTHROMBIN TIME: 26.5 s — AB (ref 11.6–15.2)

## 2015-11-12 LAB — GLUCOSE, CAPILLARY
GLUCOSE-CAPILLARY: 157 mg/dL — AB (ref 65–99)
GLUCOSE-CAPILLARY: 195 mg/dL — AB (ref 65–99)
GLUCOSE-CAPILLARY: 162 mg/dL — AB (ref 65–99)
Glucose-Capillary: 158 mg/dL — ABNORMAL HIGH (ref 65–99)

## 2015-11-12 MED ORDER — DOCUSATE SODIUM 100 MG PO CAPS
100.0000 mg | ORAL_CAPSULE | Freq: Two times a day (BID) | ORAL | Status: DC | PRN
  Administered 2015-11-13: 100 mg via ORAL
  Filled 2015-11-12: qty 1

## 2015-11-12 MED ORDER — WARFARIN SODIUM 5 MG PO TABS
5.0000 mg | ORAL_TABLET | Freq: Once | ORAL | Status: AC
  Administered 2015-11-12: 5 mg via ORAL
  Filled 2015-11-12: qty 1

## 2015-11-12 MED ORDER — SENNA 8.6 MG PO TABS: 1.0000 | ORAL_TABLET | Freq: Every day | ORAL | Status: DC | PRN

## 2015-11-12 MED ORDER — FUROSEMIDE 10 MG/ML IJ SOLN
40.0000 mg | Freq: Every day | INTRAMUSCULAR | Status: DC
  Administered 2015-11-13: 40 mg via INTRAVENOUS
  Filled 2015-11-12: qty 4

## 2015-11-12 MED ORDER — HYDROCORTISONE 2.5 % RE CREA
TOPICAL_CREAM | Freq: Two times a day (BID) | RECTAL | Status: DC | PRN
  Administered 2015-11-13 – 2015-11-25 (×2): via RECTAL
  Filled 2015-11-12 (×2): qty 28.35

## 2015-11-12 NOTE — Progress Notes (Signed)
PROGRESS NOTE    Briana [name]  [id number]  DOB: 16-Dec-1945  DOA: 11/10/2015 PCP: Briana [name] Outpatient Specialists:   Hospital course: Briana [name] is a 70 y.o. female with medical history significant of CHF (grade 3 diastolic dysfunction), DM, HTN, CKD now stage 4. She was just admitted last month for multi week (5/6-5/24) hospital stay that started with treatment in the ICU for PNA, followed by diuresis for CHF exacerbation. This was then complicated further by A.Fib RVR which was unstable and required cardioversion. Around that time she also had AKF develop that thankfully would resolve. Also had NSTEMI with trop elevation during that stay that was managed medically, no cath due to kidney function. For a full recounting of the hospital stay please see Briana [name] discharge summary dated 10/18/15. Following the hospital stay she was in rehab here at [facility].  Patient presents to the ED today with 2 week history of gradually worsening SOB, peripheral edema, orthopnea, DOE. She saw PCP earlier this week and was advised to start taking lasix BID (had been on 40mg  PO Qday since hospital discharge), but has only done this for 2 days thus far. No CP, nausea, fever, vomiting, chills, etc.  ED Course: Found to be fluid overloaded with peripheral edema and CHF on CXR. Given lasix 40mg  IV.  Assessment & Plan:   Acute on chronic diastolic CHF - clinically improving CHF pathway Lasix 40mg  IV BID ordered for now, reducing now to daily, continue gentl diuresis while watching kidney function closely Daily BMP Pt is down 3.5 Liters total  CKD stage 4 - chronic; had received HD during last hospitalization; see above, no ACEi, Renal function holding stable for now, following. Reducing lasix today.   CAD - NSTEMI last recent admission Troponin neg for ischemia, no chest pain, no EKG changes Managing  CAD medically to avoid heart cath given her CKD  DM2 - Continue home 70/30 for now CBG checks AC/HS - currently stable and well controlled, no hypoglycemia  H/o A.Fib - Continue coumadin per pharmacy Continue rhythm control with amiodarone Patient got unstable when she went into A.Fib last admission, required Briana [name]  DVT prophylaxis: Coumadin Code Status: Full Family Communication: Family at bedside Consults called: PT  Admission status: Admit to inpatient   Subjective: Pt is diuresing well, sitting up in chair  Objective: Filed Vitals:   11/11/15 1224 11/11/15 2100 11/12/15 0606 11/12/15 0936  BP: 145/46 131/35 169/56 166/46  Pulse: 51 56 55 55  Temp: 98.2 F (36.8 C) 98.4 F (36.9 C) 98.7 F (37.1 C) 98.4 F (36.9 C)  TempSrc: Oral Oral Oral Oral  Resp: 18 18 16 16   Height:      Weight:   237 lb 1.6 oz (107.548 kg)   SpO2: 95% 92% 93% 96%    Intake/Output Summary (Last 24 hours) at 11/12/15 0945 Last data filed at 11/12/15 0753  Gross per 24 hour  Intake    723 ml  Output   2375 ml  Net  -1652 ml   Filed Weights   11/10/15 2201 11/11/15 0602 11/12/15 0606  Weight: 236 lb 12.8 oz (107.412 kg) 235 lb 9.6 oz (106.867 kg) 237 lb 1.6 oz (107.548 kg)    Exam: Constitutional: NAD, calm, comfortable Eyes: PERRL, lids and conjunctivae normal ENMT: Mucous membranes are moist. Posterior pharynx clear of any exudate or lesions.Normal dentition.  Neck: normal, supple, no masses, no thyromegaly Respiratory: bibasilar crackles heard.  Cardiovascular: Regular rate and rhythm, no  murmurs / rubs / gallops. No extremity edema. 2+ pedal pulses. No carotid bruits.  Abdomen: no tenderness, no masses palpated. No hepatosplenomegaly. Bowel sounds positive.  Musculoskeletal: no clubbing / cyanosis. No joint deformity upper and lower extremities. Good ROM, no contractures. Normal muscle tone.  Skin: no rashes, lesions,  ulcers. No induration Neurologic: CN 2-12 grossly intact. Sensation intact, DTR normal. Strength 5/5 in all 4.  Psychiatric: Normal judgment and insight. Alert and oriented x 3. Normal mood.   Data Reviewed: Basic Metabolic Panel:  Recent Labs Lab 11/10/15 1647 11/11/15 0327 11/12/15 0232  NA 138 137 138  K 4.8 4.7 5.0  CL 107 106 103  CO2 21* 24 25  GLUCOSE 138* 155* 125*  BUN 56* 53* 66*  CREATININE 2.45* 2.38* 2.45*  CALCIUM 9.1 8.7* 8.6*   Liver Function Tests:  Recent Labs Lab 11/10/15 1647  AST 16  ALT 15  ALKPHOS 63  BILITOT 0.4  PROT 6.6  ALBUMIN 3.5   No results for input(s): LIPASE, AMYLASE in the last 168 hours. No results for input(s): AMMONIA in the last 168 hours. CBC:  Recent Labs Lab 11/10/15 1647  WBC 9.1  NEUTROABS 6.4  HGB 9.2*  HCT 31.3*  MCV 89.9  PLT 229   Cardiac Enzymes:  Recent Labs Lab 11/11/15 0832 11/11/15 1540 11/11/15 2051 11/12/15 0232 11/12/15 0737  TROPONINI 0.03 0.03 0.03 <0.03 <0.03   BNP (last 3 results) No results for input(s): PROBNP in the last 8760 hours. CBG:  Recent Labs Lab 11/11/15 0700 11/11/15 1131 11/11/15 1610 11/11/15 2054 11/12/15 0659  GLUCAP 129* 133* 183* 135* 157*    No results found for this or any previous visit (from the past 240 hour(s)).   Studies: Dg Chest 2 View  11/10/2015  CLINICAL DATA:  Shortness of breath EXAM: CHEST  2 VIEW COMPARISON:  10/11/2015 FINDINGS: Chronic cardiopericardial enlargement. Dialysis catheter has been removed. Prominence of the pulmonary arterial contour, chronic. Pulmonary venous congestion that is similar to prior. Trace left pleural effusion. Focal opacity at the medial right base with new obscuration of the medial diaphragm. This has a streaky appearance in the lateral projection. Prior median sternotomy for CABG. IMPRESSION: Mild CHF. Atelectasis or bronchopneumonia at the medial right base. Electronically Signed   By: Briana [name] M.D.   On:  11/10/2015 15:12   Scheduled Meds: . amiodarone  200 mg Oral Daily  . amLODipine  10 mg Oral Daily  . aspirin EC  81 mg Oral Daily  . atorvastatin  80 mg Oral Daily  . calcium acetate  1,334 mg Oral TID WC  . [START ON 11/13/2015] furosemide  40 mg Intravenous Daily  . insulin aspart protamine- aspart  15 Units Subcutaneous BID WC  . isosorbide-hydrALAZINE  2 tablet Oral TID  . metoprolol tartrate  25 mg Oral BID  . potassium chloride SA  20 mEq Oral Daily  . protein supplement  1 scoop Oral TID WC  . sodium chloride flush  3 mL Intravenous Q12H  . Warfarin - Pharmacist Dosing Inpatient   Does not apply q1800   Continuous Infusions:   Principal Problem:   Acute on chronic diastolic (congestive) heart failure (HCC) Active Problems:   Controlled diabetes mellitus type 2 with complications (HCC)   CAD in native artery   Chronic kidney disease (CKD), stage IV (severe) (HCC)  Irwin Brakeman, MD, FAAFP [facility] Pager [phone]  If 7PM-7AM, please contact night-coverage [email or web address] Password [facility] 11/12/2015, 9:45 AM  LOS: 2 days

## 2015-11-12 NOTE — Evaluation (Signed)
Physical Therapy Evaluation Patient Details Name: Briana [name] MRN: [id number] DOB: Mar 06, 1946 Today's Date: 11/12/2015   History of Present Illness  Pt is a 70 y/o F who was just admitted last month for multi week (5/6-5/24) hospital stay that started with treatment in the ICU for PNA, followed by diuresis for CHF exacerbation. This was then complicated further by A.Fib RVR which was unstable and required cardioversion. Around that time she also had AKF develop that thankfully would resolve. Also had NSTEMI with trop elevation during that stay that was managed medically, no cath due to kidney function.Pt presents this admission w/ c/o SOB, peripheral edema, and DOE.  Admitting Dx: acute on chronic diastolic CHF.  Pt's PMH includes PVD, depression, anxiety, neuropathy, a-fib.    Clinical Impression  Pt admitted with above diagnosis. Pt currently with functional limitations due to the deficits listed below (see PT Problem List). Tesneem presents w/ generalized weakness and SOB.  See general notes below regarding discovery of O2 not connected and SpO2 reading at 67%.  SpO2 up to 91% after re-connection of O2 on 5L O2.  Pt declined transfers and ambulation today due to fatigue and lethargy.  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  Recommending ST SNF at d/c. Pt will benefit from skilled PT to increase their independence and safety with mobility to allow discharge to the venue listed below.      Follow Up Recommendations SNF;Supervision for mobility/OOB    Equipment Recommendations  Rolling walker with 5" wheels    Recommendations for Other Services       Precautions / Restrictions Precautions Precautions: Fall Precaution Comments: O2 drops with activity Restrictions Weight Bearing Restrictions: No      Mobility  Bed Mobility               General bed mobility comments: Pt sitting in  recliner chair upon PT arrival  Transfers                 General transfer comment: Pt declined due to feeling "lousy"  Ambulation/Gait                Stairs            Wheelchair Mobility    Modified Rankin (Stroke Patients Only)       Balance Overall balance assessment: Needs assistance Sitting-balance support: No upper extremity supported;Feet supported Sitting balance-Leahy Scale: Fair Sitting balance - Comments: Cues for upright posture. Pt appears fatigued w/ sitting at edge of seat.                                     Pertinent Vitals/Pain Pain Assessment: No/denies pain Pain Intervention(s): Limited activity within patient's tolerance;Monitored during session    [place] expects to be discharged to:: Skilled nursing facility Living Arrangements: Spouse/significant other Available Help at Discharge: Family;Available PRN/intermittently (daughter lives 2 miles from pt) Type of Home: House Home Access: Ramped entrance     Home Layout: One level Home Equipment: Bedside commode;Shower seat;Grab bars - tub/shower;Hand held shower head;Cane - single point Additional Comments: Pt has been caring for her husband who has dementia and has been detoxing and this has worn her out.  Husband was in hospital recently for 1 wk.    Prior Function Level of Independence: Independent with  assistive device(s)         Comments: Ind w/ bathing, dressing.  Has been ambulating 2 minutes w/ HHPT on 2-4L O2.      Hand Dominance   Dominant Hand: Right    Extremity/Trunk Assessment   Upper Extremity Assessment: Generalized weakness           Lower Extremity Assessment: Generalized weakness         Communication   Communication: No difficulties  Cognition Arousal/Alertness: Awake/alert;Lethargic Behavior During Therapy: WFL for tasks assessed/performed Overall Cognitive Status: Within Functional Limits for tasks  assessed                      General Comments General comments (skin integrity, edema, etc.): Pt reports c/o Lt jaw pain that started just a few minutes before PT enetered room and cleared before PT arrived. During PT evaluation noted pt's O2 line to not be hooked up to the O2 wall supply.  SpO2 reading at 67% and pt c/o feeling "lousy".  Line re-connected at 5L O2 and SpO2 up to 91% quickly w/ pursed lip breathing.  RN notified of incident and Lt jaw pain.    Exercises General Exercises - Upper Extremity Shoulder Flexion: AROM;Both;10 reps;Seated General Exercises - Lower Extremity Ankle Circles/Pumps: AROM;Both;10 reps;Seated Quad Sets: Strengthening;Both;10 reps;Seated Other Exercises Other Exercises: Sitting at edge of chair for 5 minutes w/ cues for upright posture and pursed lip breathing. Other Exercises: Encouraged pt to practice pursed lip breathing technique throughout the day.      Assessment/Plan    PT Assessment Patient needs continued PT services  PT Diagnosis Difficulty walking;Generalized weakness   PT Problem List Decreased strength;Decreased activity tolerance;Decreased balance;Decreased safety awareness;Cardiopulmonary status limiting activity  PT Treatment Interventions DME instruction;Gait training;Functional mobility training;Therapeutic activities;Therapeutic exercise;Balance training;Patient/family education   PT Goals (Current goals can be found in the Care Plan section) Acute Rehab PT Goals Patient Stated Goal: to feel better PT Goal Formulation: With patient/family Time For Goal Achievement: 11/26/15 Potential to Achieve Goals: Good    Frequency Min 2X/week   Barriers to discharge Decreased caregiver support Pt is caregiver for her husband who has dementia    Co-evaluation               End of Session Equipment Utilized During Treatment: Oxygen Activity Tolerance: Patient limited by fatigue Patient left: in chair;with call bell/phone  within reach Nurse Communication: Mobility status;Other (comment) (jaw pain; oxygen incident as documented above)         Time: [id number] PT Time Calculation (min) (ACUTE ONLY): 25 min   Charges:   PT Evaluation $PT Eval Low Complexity: 1 Procedure PT Treatments $Therapeutic Exercise: 8-22 mins   PT G Codes:       Collie Siad PT, DPT  Pager: [phone] Phone: [phone] 11/12/2015, 9:53 AM

## 2015-11-12 NOTE — Progress Notes (Signed)
ANTICOAGULATION CONSULT NOTE - Initial Consult  Pharmacy Consult for warfarin Indication: atrial fibrillation  Allergies  Allergen Reactions  . Carbamazepine Other (See Comments)    Reaction to tegretol - loopy  . Clonazepam Other (See Comments)    Unknown reaction  . Gemfibrozil Other (See Comments)    Patient is not aware of this allergy but states that she has side effects to a cholesterol medication in the past  . Macrobid [Nitrofurantoin] Nausea And Vomiting  . Oxycodone Other (See Comments)    hallucinations    Patient Measurements: Height: 5\' 4"  (162.6 cm) Weight: 237 lb 1.6 oz (107.548 kg) IBW/kg (Calculated) : 54.7  Vital Signs: Temp: 98.4 F (36.9 C) (06/18 0936) Temp Source: Oral (06/18 0936) BP: 166/46 mmHg (06/18 0936) Pulse Rate: 55 (06/18 0936)  Labs:  Recent Labs  11/10/15 1647 11/10/15 1852  11/11/15 0327  11/11/15 2051 11/12/15 0232 11/12/15 0737  HGB 9.2*  --   --   --   --   --   --   --   HCT 31.3*  --   --   --   --   --   --   --   PLT 229  --   --   --   --   --   --   --   LABPROT  --  27.1*  --  29.3*  --   --  26.5*  --   INR  --  2.55*  --  2.83*  --   --  2.47*  --   CREATININE 2.45*  --   --  2.38*  --   --  2.45*  --   TROPONINI 0.13*  --   < > 0.03  < > 0.03 <0.03 <0.03  < > = values in this interval not displayed.  Estimated Creatinine Clearance: 25.6 mL/min (by C-G formula based on Cr of 2.45).   Medical History: Past Medical History  Diagnosis Date  . Diabetes mellitus   . Hypertension   . Hyperlipidemia   . Heart murmur   . Peripheral vascular disease ([place])   . Arthritis   . Depression with anxiety   . Carotid artery occlusion   . Chronic kidney disease   . Neuropathy ([place])   . CAD (coronary artery disease)   . Rheumatic fever   . Sleep apnea   . PAD (peripheral artery disease) ([place])   . Adenomatous colon polyp 09/2007  . Atrial fibrillation with rapid ventricular response ([place]) 10/05/2015    Medications:   Scheduled:  . amiodarone  200 mg Oral Daily  . amLODipine  10 mg Oral Daily  . aspirin EC  81 mg Oral Daily  . atorvastatin  80 mg Oral Daily  . calcium acetate  1,334 mg Oral TID WC  . [START ON 11/13/2015] furosemide  40 mg Intravenous Daily  . insulin aspart protamine- aspart  15 Units Subcutaneous BID WC  . isosorbide-hydrALAZINE  2 tablet Oral TID  . metoprolol tartrate  25 mg Oral BID  . potassium chloride SA  20 mEq Oral Daily  . protein supplement  1 scoop Oral TID WC  . sodium chloride flush  3 mL Intravenous Q12H  . warfarin  5 mg Oral ONCE-1800  . Warfarin - Pharmacist Dosing Inpatient   Does not apply q1800    Assessment: warfarin for Hx Afib. Last dose 6/15. Hgb 9.2, PLT wnl. INR therapeutic at 2.47.  PTA: 5mg  daily except 2.5mg  on Tuesday  and Thursday  Goal of Therapy:  INR 2-3 Monitor platelets by anticoagulation protocol: Yes   Plan:  Warfarin 5mg  *1 tonight Daily INR Follow for signs and symptoms of bleeding  Joya San, PharmD Clinical Pharmacy Resident Pager # [phone] 11/12/2015 1:14 PM

## 2015-11-12 NOTE — Discharge Instructions (Addendum)
Information on my medicine - Coumadin   (Warfarin)  This medication education was reviewed with me or my healthcare representative as part of my discharge preparation.  The pharmacist that spoke with me during my hospital stay was:  Roma Schanz, [facility]  Why was Coumadin prescribed for you? Coumadin was prescribed for you because you have a blood clot or a medical condition that can cause an increased risk of forming blood clots. Blood clots can cause serious health problems by blocking the flow of blood to the heart, lung, or brain. Coumadin can prevent harmful blood clots from forming. As a reminder your indication for Coumadin is:   Stroke Prevention Because Of Atrial Fibrillation  What test will check on my response to Coumadin? While on Coumadin (warfarin) you will need to have an INR test regularly to ensure that your dose is keeping you in the desired range. The INR (international normalized ratio) number is calculated from the result of the laboratory test called prothrombin time (PT).  If an INR APPOINTMENT HAS NOT ALREADY BEEN MADE FOR YOU please schedule an appointment to have this lab work done by your health care provider within 7 days. Your INR goal is usually a number between:  2 to 3 or your provider may give you a more narrow range like 2-2.5.  Ask your health care provider during an office visit what your goal INR is.  What  do you need to  know  About  COUMADIN? Take Coumadin (warfarin) exactly as prescribed by your healthcare provider about the same time each day.  DO NOT stop taking without talking to the doctor who prescribed the medication.  Stopping without other blood clot prevention medication to take the place of Coumadin may increase your risk of developing a new clot or stroke.  Get refills before you run out.  What do you do if you miss a dose? If you miss a dose, take it as soon as you remember on the same day then continue your regularly scheduled regimen the next  day.  Do not take two doses of Coumadin at the same time.  Important Safety Information A possible side effect of Coumadin (Warfarin) is an increased risk of bleeding. You should call your healthcare provider right away if you experience any of the following: ? Bleeding from an injury or your nose that does not stop. ? Unusual colored urine (red or dark brown) or unusual colored stools (red or black). ? Unusual bruising for unknown reasons. ? A serious fall or if you hit your head (even if there is no bleeding).  Some foods or medicines interact with Coumadin (warfarin) and might alter your response to warfarin. To help avoid this: ? Eat a balanced diet, maintaining a consistent amount of Vitamin K. ? Notify your provider about major diet changes you plan to make. ? Avoid alcohol or limit your intake to 1 drink for women and 2 drinks for men per day. (1 drink is 5 oz. wine, 12 oz. beer, or 1.5 oz. liquor.)  Make sure that ANY health care provider who prescribes medication for you knows that you are taking Coumadin (warfarin).  Also make sure the healthcare provider who is monitoring your Coumadin knows when you have started a new medication including herbals and non-prescription products.  Coumadin (Warfarin)  Major Drug Interactions  Increased Warfarin Effect Decreased Warfarin Effect  Alcohol (large quantities) Antibiotics (esp. Septra/Bactrim, Flagyl, Cipro) Amiodarone (Cordarone) Aspirin (ASA) Cimetidine (Tagamet) Megestrol (Megace) NSAIDs (ibuprofen,  naproxen, etc.) Piroxicam (Feldene) Propafenone (Rythmol SR) Propranolol (Inderal) Isoniazid (INH) Posaconazole (Noxafil) Barbiturates (Phenobarbital) Carbamazepine (Tegretol) Chlordiazepoxide (Librium) Cholestyramine (Questran) Griseofulvin Oral Contraceptives Rifampin Sucralfate (Carafate) Vitamin K   Coumadin (Warfarin) Major Herbal Interactions  Increased Warfarin Effect Decreased Warfarin Effect   Garlic Ginseng Ginkgo biloba Coenzyme Q10 Green tea St. Johns wort    Coumadin (Warfarin) FOOD Interactions  Eat a consistent number of servings per week of foods HIGH in Vitamin K (1 serving =  cup)  Collards (cooked, or boiled & drained) Kale (cooked, or boiled & drained) Mustard greens (cooked, or boiled & drained) Parsley *serving size only =  cup Spinach (cooked, or boiled & drained) Swiss chard (cooked, or boiled & drained) Turnip greens (cooked, or boiled & drained)  Eat a consistent number of servings per week of foods MEDIUM-HIGH in Vitamin K (1 serving = 1 cup)  Asparagus (cooked, or boiled & drained) Broccoli (cooked, boiled & drained, or raw & chopped) Brussel sprouts (cooked, or boiled & drained) *serving size only =  cup Lettuce, raw (green leaf, endive, romaine) Spinach, raw Turnip greens, raw & chopped   These websites have more information on Coumadin (warfarin):  FailFactory.se; VeganReport.com.au;     12/05/2015 Briana [name] [id number] 1946-03-01  Surgeon(s): Rosetta Posner, MD  Procedure(s): Left Basilic Vein Transposition  x Do not stick fistula for 12 weeks    Dialysis Dialysis is a procedure that replaces some of the work healthy kidneys do. It is done when you lose about 85-90% of your kidney function. It may also be done earlier if your symptoms may be improved by dialysis. During dialysis, wastes, salt, and extra water are removed from the blood, and the levels of certain chemicals in the blood (such as potassium) are maintained. Dialysis is done in sessions. Dialysis sessions are continued until the kidneys get better. If the kidneys cannot get better, such as in end-stage kidney disease, dialysis is continued for life or until you receive a new kidney (kidney transplant). There are two types of dialysis: hemodialysis and peritoneal dialysis. WHAT IS HEMODIALYSIS?  Hemodialysis is a type of dialysis in which a  machine called a dialyzer is used to filter the blood. Before beginning hemodialysis, you will have surgery to create a site where blood can be removed from the body and returned to the body (vascular access). There are three types of vascular accesses:  Arteriovenous fistula. To create this type of access, an artery is connected to a vein (usually in the arm). A fistula takes 1-6 months to develop after surgery. If it develops properly, it usually lasts longer than the other types of vascular accesses. It is also less likely to become infected and cause blood clots.  Arteriovenous graft. To create this type of access, an artery and a vein in the arm are connected with a tube. A graft may be used within 2-3 weeks of surgery.  A venous catheter. To create this type of access, a thin, flexible tube (catheter) is placed in a large vein in your neck, chest, or groin. A catheter may be used right away. It is usually used as a temporary access when dialysis needs to begin immediately. During hemodialysis, blood leaves the body through your access. It travels through a tube to the dialyzer, where it is filtered. The blood then returns to your body through another tube. Hemodialysis is usually performed by a health care provider at a hospital or dialysis center three times a week. Visits last  about 3-4 hours. It may also be performed with the help of another person at home with training.  WHAT IS PERITONEAL DIALYSIS? Peritoneal dialysis is a type of dialysis in which the thin lining of the abdomen (peritoneum) is used as a filter. Before beginning peritoneal dialysis, you will have surgery to place a catheter in your abdomen. The catheter will be used to transfer a fluid called dialysate to and from your abdomen. At the start of a session, your abdomen is filled with dialysate. During the session, wastes, salt, and extra water in the blood pass through the peritoneum and into the dialysate. The dialysate is  drained from the body at the end of the session. The process of filling and draining the dialysate is called an exchange. Exchanges are repeated until you have used up all the dialysate for the day. Peritoneal dialysis may be performed by you at home or at almost any other location. It is done every day. You may need up to five exchanges a day. The amount of time the dialysate is in your body between exchanges is called a dwell. The dwell depends on the number of exchanges needed and the characteristics of the peritoneum. It usually varies from 1.5-3 hours. You may go about your day normally between exchanges. Alternately, the exchanges may be done at night while you sleep, using a machine called a cycler. WHICH TYPE OF DIALYSIS SHOULD I CHOOSE?  Both hemodialysis and peritoneal dialysis have advantages and disadvantages. Talk to your health care provider about which type of dialysis would be best for you. Your lifestyle and preferences should be considered along with your medical condition. In some cases, only one type of dialysis may be an option.  Advantages of hemodialysis  It is done less often than peritoneal dialysis.  Someone else can do the dialysis for you.  If you go to a dialysis center, your health care provider will be able to recognize any problems right away.  If you go to a dialysis center, you can interact with others who are having dialysis. This can provide you with emotional support. Disadvantages of hemodialysis  Hemodialysis may cause cramps and low blood pressure. It may leave you feeling tired on the days you have the treatment.  If you go to a dialysis center, you will need to make weekly appointments and work around the center's schedule.  You will need to take extra care when traveling. If you go to a dialysis center, you will need to make special arrangements to visit a dialysis center near your destination. If you are having treatments at home, you will need to take  the dialyzer with you to your destination.  You will need to avoid more foods than you would need to avoid on peritoneal dialysis. Advantages of peritoneal dialysis  It is less likely than hemodialysis to cause cramps and low blood pressure.  You may do exchanges on your own wherever you are, including when you travel.  You do not need to avoid as many foods as you do on hemodialysis. Disadvantages of peritoneal dialysis  It is done more often than hemodialysis.  Performing peritoneal dialysis requires you to have dexterity of the hands. You must also be able to lift bags.  You will have to learn sterilization techniques. You will need to practice them every day to reduce the risk of infection. WHAT CHANGES WILL I NEED TO MAKE TO MY DIET DURING DIALYSIS? Both hemodialysis and peritoneal dialysis require you to make  some changes to your diet. For example, you will need to limit your intake of foods high in the minerals phosphorus and potassium. You will also need to limit your fluid intake. Your dietitian can help you plan meals. A good meal plan can improve your dialysis and your health.  WHAT SHOULD I EXPECT WHEN BEGINNING DIALYSIS? Adjusting to the dialysis treatment, schedule, and diet can take some time. You may need to stop working and may not be able to do some of the things you normally do. You may feel anxious or depressed when beginning dialysis. Eventually, many people feel better overall because of dialysis. Some people are able to return to work after making some changes, such as reducing work intensity. WHERE CAN I FIND MORE INFORMATION?   [place]: www.kidney.org  American Association of Kidney Patients: BombTimer.gl  American Kidney Fund: www.kidneyfund.org   This information is not intended to replace advice given to you by your health care provider. Make sure you discuss any questions you have with your health care provider.   Document Released:  08/03/2002 Document Revised: 06/03/2014 Document Reviewed: 07/07/2012 Elsevier Interactive Patient Education [employer].

## 2015-11-13 DIAGNOSIS — I4891 Unspecified atrial fibrillation: Secondary | ICD-10-CM

## 2015-11-13 DIAGNOSIS — I5033 Acute on chronic diastolic (congestive) heart failure: Secondary | ICD-10-CM

## 2015-11-13 DIAGNOSIS — I10 Essential (primary) hypertension: Secondary | ICD-10-CM

## 2015-11-13 LAB — BASIC METABOLIC PANEL
Anion gap: 9 (ref 5–15)
BUN: 77 mg/dL — AB (ref 6–20)
CHLORIDE: 104 mmol/L (ref 101–111)
CO2: 24 mmol/L (ref 22–32)
CREATININE: 2.58 mg/dL — AB (ref 0.44–1.00)
Calcium: 8.3 mg/dL — ABNORMAL LOW (ref 8.9–10.3)
GFR calc Af Amer: 21 mL/min — ABNORMAL LOW (ref >60)
GFR calc non Af Amer: 18 mL/min — ABNORMAL LOW (ref >60)
GLUCOSE: 156 mg/dL — AB (ref 65–99)
Potassium: 5.3 mmol/L — ABNORMAL HIGH (ref 3.5–5.1)
Sodium: 137 mmol/L (ref 135–145)

## 2015-11-13 LAB — CBC
HEMATOCRIT: 25.4 % — AB (ref 36.0–46.0)
HEMOGLOBIN: 7.7 g/dL — AB (ref 12.0–15.0)
MCH: 27.1 pg (ref 26.0–34.0)
MCHC: 30.3 g/dL (ref 30.0–36.0)
MCV: 89.4 fL (ref 78.0–100.0)
Platelets: 209 10*3/uL (ref 150–400)
RBC: 2.84 MIL/uL — ABNORMAL LOW (ref 3.87–5.11)
RDW: 16.4 % — ABNORMAL HIGH (ref 11.5–15.5)
WBC: 8.1 10*3/uL (ref 4.0–10.5)

## 2015-11-13 LAB — GLUCOSE, CAPILLARY
GLUCOSE-CAPILLARY: 166 mg/dL — AB (ref 65–99)
GLUCOSE-CAPILLARY: 155 mg/dL — AB (ref 65–99)
Glucose-Capillary: 246 mg/dL — ABNORMAL HIGH (ref 65–99)
Glucose-Capillary: 131 mg/dL — ABNORMAL HIGH (ref 65–99)

## 2015-11-13 LAB — PROTIME-INR
INR: 1.96 — ABNORMAL HIGH (ref 0.00–1.49)
Prothrombin Time: 22.2 seconds — ABNORMAL HIGH (ref 11.6–15.2)

## 2015-11-13 LAB — TROPONIN I
Troponin I: <0.03 ng/mL (ref <0.031)
Troponin I: <0.03 ng/mL (ref <0.031)

## 2015-11-13 MED ORDER — WARFARIN SODIUM 7.5 MG PO TABS
7.5000 mg | ORAL_TABLET | Freq: Once | ORAL | Status: AC
  Administered 2015-11-13: 7.5 mg via ORAL
  Filled 2015-11-13: qty 1

## 2015-11-13 NOTE — Care Management Important Message (Signed)
Important Message  Patient Details  Name: Briana [name] MRN: [id number] Date of Birth: 06/12/1945   Medicare Important Message Given:  Yes    Loann Quill 11/13/2015, 10:13 AM

## 2015-11-13 NOTE — Clinical Social Work Placement (Signed)
   CLINICAL SOCIAL WORK PLACEMENT  NOTE  Date:  11/13/2015  Patient Details  Name: Briana [name] MRN: [id number] Date of Birth: Apr 20, 1946  Clinical Social Work is seeking post-discharge placement for this patient at the [place] level of care (*CSW will initial, date and re-position this form in  chart as items are completed):  Yes   Patient/family provided with [facility] Work Department's list of facilities offering this level of care within the geographic area requested by the patient (or if unable, by the patient's family).  Yes   Patient/family informed of their freedom to choose among providers that offer the needed level of care, that participate in Medicare, Medicaid or managed care program needed by the patient, have an available bed and are willing to accept the patient.  Yes   Patient/family informed of [facility]'s ownership interest in [facility] and [facility], as well as of the fact that they are under no obligation to receive care at these facilities.  PASRR submitted to EDS on 11/13/15     PASRR number received on       Existing PASRR number confirmed on 11/13/15     FL2 transmitted to all facilities in geographic area requested by pt/family on 11/13/15     FL2 transmitted to all facilities within larger geographic area on       Patient informed that his/her managed care company has contracts with or will negotiate with certain facilities, including the following:            Patient/family informed of bed offers received.  Patient chooses bed at       Physician recommends and patient chooses bed at      Patient to be transferred to   on  .  Patient to be transferred to facility by       Patient family notified on   of transfer.  Name of family member notified:        PHYSICIAN Please sign FL2     Additional Comment:    _______________________________________________ Candie Chroman, LCSW 11/13/2015,  11:42 AM

## 2015-11-13 NOTE — Clinical Social Work Note (Signed)
CSW spoke with [name], admissions coordinator at [facility]. They will be able to accept patient. Discharge date unknown.  Briana [name], [name]

## 2015-11-13 NOTE — Progress Notes (Signed)
Patient ID: Briana [name], female   DOB: [birth year], 70 y.o.   MRN: [id number]  PROGRESS NOTE    KORISSA SWEETIN  [id number] DOB: June 23, 1945 DOA: 11/10/2015  PCP: [name]   Brief Narrative:  70 y.o. female with past medical history significant for chronic diastolic CHF (in [id number] TEE with EF 55%), hypertension, dyslipidemia, atrial fibrillation on anticoagulation with Coumadin, recently hospitalized 5/6-5/24 of 2017 for pneumonia which was then complicated further by A.Fib RVR which was unstable and required cardioversion. She also had NSTEMI which was managed medically and no cardiac cath due to acute kidney failure. She was discharged to inpatient rehab.   Patient [facility] with worsening shortness of breath over past 2 weeks prior to this admission, or extremity edema. She was seen by primary care physician the week prior to the admission and was instructed to take Lasix twice daily whereas before she was on 40 mg once a day Lasix. There was no significant symptomatic relief for which reason she presented to ED for further evaluation.    Assessment & Plan:    Acute on chronic diastolic CHF  - Has had TEE for cardioversion in 09/2015 which showed EF 55% - BNP on this admission 668  - Patient started on Lasix 40 mg IV daily - Weight in past 24 hours: 106.86 kg --> 107.54 kg --> 108.5 kg.  - Cardiology consulted, appreciate their input - Renal function is at baseline, stable but continue to monitor BMP daily while on IV Lasix  CKD stage 4 - Recent creatinine 3 weeks ago was 2.75 - Creatinine 2.38 on this admission and this morning 2.58 - As mentioned continue to monitor BMP daily while patient on IV Lasix - Continue phoslo   Anemia of chronic kidney disease - Hemoglobin 7.7 this am, drop from 9.2 on admission - Monitor CBC daily  - No reports of bleeding   Atrial fibrillation  - CHADS vasc score 5 - Recent TEE 09/2015 for cardioversion -  Continue amiodarone 200 mg daily and metoprolol 25 mg twice daily. Monitor heart rate as this am HR 58. 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 - Continue normal lobe 70/30 mix 15 units twice daily - Sliding-scale insulin  Dyslipidemia associated with type 2 diabetes mellitus - Continue Lipitor 80 mg at bedtime - Continue daily aspirin   Essential hypertension - Continue Norvasc 10 mg daily, Bidil  TID    DVT prophylaxis: on full dose anticoagulation with coumadin  Code Status: full code  Family Communication: No family at the bedside this am, called pt daughter Marcie Bal over the phone, gave an update  Disposition Plan: Will be seen by cardiology so home once cleared by cardiology   Consultants:   Cardiology   Procedures:   None   Antimicrobials:   None    Subjective: No overnight events.   Objective: Filed Vitals:   11/13/15 0046 11/13/15 0424 11/13/15 0632 11/13/15 1000  BP: 149/46  159/53 174/48  Pulse: 58  58 58  Temp:   98 F (36.7 C)   TempSrc:   Oral   Resp: 18  18   Height:      Weight:  108.5 kg (239 lb 3.2 oz)    SpO2: 91%  95%     Intake/Output Summary (Last 24 hours) at 11/13/15 1131 Last data filed at 11/13/15 0959  Gross per 24 hour  Intake    960  ml  Output   2275 ml  Net  -1315 ml   Filed Weights   11/11/15 0602 11/12/15 0606 11/13/15 0424  Weight: 106.867 kg (235 lb 9.6 oz) 107.548 kg (237 lb 1.6 oz) 108.5 kg (239 lb 3.2 oz)    Examination:  General exam: Appears calm and comfortable  Respiratory system: Clear to auscultation. Respiratory effort normal. Cardiovascular system: S1 & S2 heard, Slightly bradycardic.  Gastrointestinal system: Abdomen is nondistended, soft and nontender. No organomegaly or masses felt. Normal bowel sounds heard. Central nervous system: Alert and oriented. No focal neurological deficits. Extremities: Symmetric 5 x 5 power. (+1-2) LE  pitting edema bilaterally  Skin: No rashes, lesions or ulcers Psychiatry: Judgement and insight appear normal. Mood & affect appropriate.   Data Reviewed: I have personally reviewed following labs and imaging studies  CBC:  Recent Labs Lab 11/10/15 1647 11/13/15 0217  WBC 9.1 8.1  NEUTROABS 6.4  --   HGB 9.2* 7.7*  HCT 31.3* 25.4*  MCV 89.9 89.4  PLT 229 [id number]   Basic Metabolic Panel:  Recent Labs Lab 11/10/15 1647 11/11/15 0327 11/12/15 0232 11/13/15 0217  NA 138 137 138 137  K 4.8 4.7 5.0 5.3*  CL 107 106 103 104  CO2 21* 24 25 24   GLUCOSE 138* 155* 125* 156*  BUN 56* 53* 66* 77*  CREATININE 2.45* 2.38* 2.45* 2.58*  CALCIUM 9.1 8.7* 8.6* 8.3*   GFR: Estimated Creatinine Clearance: 24.4 mL/min (by C-G formula based on Cr of 2.58). Liver Function Tests:  Recent Labs Lab 11/10/15 1647  AST 16  ALT 15  ALKPHOS 63  BILITOT 0.4  PROT 6.6  ALBUMIN 3.5   No results for input(s): LIPASE, AMYLASE in the last 168 hours. No results for input(s): AMMONIA in the last 168 hours. Coagulation Profile:  Recent Labs Lab 11/10/15 1852 11/11/15 0327 11/12/15 0232 11/13/15 0217  INR 2.55* 2.83* 2.47* 1.96*   Cardiac Enzymes:  Recent Labs Lab 11/12/15 0737 11/12/15 1341 11/12/15 2027 11/13/15 0217 11/13/15 0824  TROPONINI <0.03 <0.03 0.03 <0.03 <0.03   BNP (last 3 results) No results for input(s): PROBNP in the last 8760 hours. HbA1C: No results for input(s): HGBA1C in the last 72 hours. CBG:  Recent Labs Lab 11/12/15 0659 11/12/15 1139 11/12/15 1636 11/12/15 2214 11/13/15 0630  GLUCAP 157* 195* 162* 158* 166*   Lipid Profile: No results for input(s): CHOL, HDL, LDLCALC, TRIG, CHOLHDL, LDLDIRECT in the last 72 hours. Thyroid Function Tests: No results for input(s): TSH, T4TOTAL, FREET4, T3FREE, THYROIDAB in the last 72 hours. Anemia Panel: No results for input(s): VITAMINB12, FOLATE, FERRITIN, TIBC, IRON, RETICCTPCT in the last 72 hours. Urine  analysis:    Component Value Date/Time   COLORURINE YELLOW 11/10/2015 1521   APPEARANCEUR CLEAR 11/10/2015 1521   LABSPEC 1.013 11/10/2015 1521   PHURINE 5.5 11/10/2015 1521   GLUCOSEU NEGATIVE 11/10/2015 1521   HGBUR NEGATIVE 11/10/2015 1521   BILIRUBINUR NEGATIVE 11/10/2015 1521   KETONESUR NEGATIVE 11/10/2015 1521   PROTEINUR 100* 11/10/2015 1521   UROBILINOGEN 0.2 03/02/2014 1508   NITRITE NEGATIVE 11/10/2015 1521   LEUKOCYTESUR TRACE* 11/10/2015 1521   Sepsis Labs: @LABRCNTIP (procalcitonin:4,lacticidven:4)   )No results found for this or any previous visit (from the past 240 hour(s)).    Radiology Studies: Dg Chest 2 View 11/10/2015 Mild CHF. Atelectasis or bronchopneumonia at the medial right base. Electronically Signed   By: Monte Fantasia M.D.   On: 11/10/2015 15:12    Scheduled Meds: . amiodarone  200 mg Oral Daily  . amLODipine  10 mg Oral Daily  . aspirin EC  81 mg Oral Daily  . atorvastatin  80 mg Oral Daily  . calcium acetate  1,334 mg Oral TID WC  . furosemide  40 mg Intravenous Daily  . insulin aspart protamine- aspart  15 Units Subcutaneous BID WC  . isosorbide-hydrALAZINE  2 tablet Oral TID  . metoprolol tartrate  25 mg Oral BID  . protein supplement  1 scoop Oral TID WC  . Warfarin - Pharmacist Dosing Inpatient   Does not apply q1800   Continuous Infusions:    LOS: 3 days    Time spent: 25 minutes  Greater than 50% of the time spent on counseling and coordinating the care.   Leisa Lenz, MD [facility] Pager [phone]  If 7PM-7AM, please contact night-coverage [email or web address] Password [facility] 11/13/2015, 11:31 AM

## 2015-11-13 NOTE — Consult Note (Signed)
Cardiology Consult    Patient ID: JESSICAH FABIAN MRN: [id number], DOB/AGE: Apr 29, 1946   Admit date: 11/10/2015 Date of Consult: 11/13/2015  Primary Physician: Antionette Fairy, PA-C Primary Cardiologist: Dr. Lynnell Jude Requesting Provider: Dr. Charlies Silvers  Patient Profile    70 yo female with PMH of DM II/HTN/HLD/PVD/CKD IV/A-fib (on coumadin)/CAD s/p CABG 2005 and chronic Diastolic CHF who presented to the [facility] [place] with reports of worsening DOE over the past 2 weeks.   Past Medical History   Past Medical History  Diagnosis Date  . Diabetes mellitus   . Hypertension   . Hyperlipidemia   . Heart murmur   . Peripheral vascular disease ([place])   . Arthritis   . Depression with anxiety   . Carotid artery occlusion   . Chronic kidney disease   . Neuropathy ([place])   . CAD (coronary artery disease)   . Rheumatic fever   . Sleep apnea   . PAD (peripheral artery disease) ([place])   . Adenomatous colon polyp 09/2007  . Atrial fibrillation with rapid ventricular response ([place]) 10/05/2015    Past Surgical History  Procedure Laterality Date  . Coronary artery bypass graft  2005  . Carotid endarterectomy  10/04/2009    Right CEA  . Tubal ligation    . Polypectomy  02/25/2012    Procedure: POLYPECTOMY;  Surgeon: Danie Binder, MD;  Location: [facility];  Service: Endoscopy;  Laterality: N/A;     Allergies  Allergies  Allergen Reactions  . Carbamazepine Other (See Comments)    Reaction to tegretol - loopy  . Clonazepam Other (See Comments)    Unknown reaction  . Gemfibrozil Other (See Comments)    Patient is not aware of this allergy but states that she has side effects to a cholesterol medication in the past  . Macrobid [Nitrofurantoin] Nausea And Vomiting  . Oxycodone Other (See Comments)    hallucinations    History of Present Illness    Mrs. Ryberg is a 70 yo female of Dr. Lynnell Jude with PMH of DM II/HTN/HLD/PVD/CKD IV/A-fib (on coumadin)/CAD s/p CABG 2005 and chronic  Diastolic CHF. She was last seen in the office 10/27/2015 by Jory Sims for follow up after recent hospitalization for chest pain. During that admission she had a 2D echo showing an EF of 55-60% with NWMA, and underwent stress testing showing an anterior defect with concerns for peri infarct ischemia, but cardiac cath was deferred given her renal function and managed medically. Also exhibited PAF and had a DCCV by Dr. Oval Linsey.   Reports being in her normal state of health up until Monday of last week. States she first began to notice the SOB that night when she was getting ready for bed and went to lay down. States this progressed over the next couple of days, until Wednesday when she was coming in the house from a nephrology appt, and was severely dyspneic even with her home O2 on. She denies having had any chest pain, palpitations, light-headedness, dizziness or nausea/vomiting with this dyspnea. Reports normally sleeping with 2 pillows at home, but the last 2 nights prior to admission, she had to sleep in her lift chair. She does report at the nephrology office they increased her lasix dose from 40mg  daily to BID in the hopes to remove the extra edema.   In the ED her labs showed elevated BNP 668, Cr 2.45, trop 0.13, Hgb 9.2, and INR 2.5. Chest x-ray showed mild CHF. She was started on IV  lasix 40 mg BID and admitted to Internal medicine for further diuresis. Cr has began to increase and her lasix dose was cut back to 40 IV daily.   Cardiology has been consulted in relation to help manage her CHF.   Inpatient Medications    . amiodarone  200 mg Oral Daily  . amLODipine  10 mg Oral Daily  . aspirin EC  81 mg Oral Daily  . atorvastatin  80 mg Oral Daily  . calcium acetate  1,334 mg Oral TID WC  . furosemide  40 mg Intravenous Daily  . insulin aspart protamine- aspart  15 Units Subcutaneous BID WC  . isosorbide-hydrALAZINE  2 tablet Oral TID  . metoprolol tartrate  25 mg Oral BID  . protein  supplement  1 scoop Oral TID WC  . sodium chloride flush  3 mL Intravenous Q12H  . Warfarin - Pharmacist Dosing Inpatient   Does not apply q1800    Family History    Family History  Problem Relation Age of Onset  . Breast cancer Mother   . Heart disease Father   . Diabetes Father   . Heart disease Brother     Social History    Social History   Social History  . Marital Status: Married    Spouse Name: N/A  . Number of Children: N/A  . Years of Education: N/A   Occupational History  . Not on file.   Social History Main Topics  . Smoking status: Never Smoker   . Smokeless tobacco: Never Used  . Alcohol Use: No  . Drug Use: No  . Sexual Activity: Not on file   Other Topics Concern  . Not on file   Social History Narrative     Review of Systems    General:  No chills, fever, night sweats or weight changes.  Cardiovascular:  No chest pain, + dyspnea on exertion, + edema, + orthopnea, palpitations, + paroxysmal nocturnal dyspnea. Dermatological: No rash, lesions/masses Respiratory: No cough, dyspnea Urologic: No hematuria, dysuria Abdominal:   No nausea, vomiting, diarrhea, bright red blood per rectum, melena, or hematemesis Neurologic:  No visual changes, wkns, changes in mental status. All other systems reviewed and are otherwise negative except as noted above.  Physical Exam    Blood pressure 151/44, pulse 58, temperature 98.4 F (36.9 C), temperature source Oral, resp. rate 18, height 5\' 4"  (1.626 m), weight 239 lb 3.2 oz (108.5 kg), SpO2 95 %.  General: Pleasant older female, NAD, wearing [place] at 4L Psych: Normal affect. Neuro: Alert and oriented X 3. Moves all extremities spontaneously. HEENT: Normal  Neck: Supple without bruits, + JVD. Lungs:  Resp regular and unlabored, Diminished bilaterally. Heart: RRR no s3, s4, 2/6 systolic murmur. Abdomen: Soft, non-tender, non-distended, BS + x 4.  Extremities: No clubbing, cyanosis, 3+ edema into upper thighs.  DP/PT/Radials 2+ and equal bilaterally.  Labs    Troponin (Point of Care Test) No results for input(s): TROPIPOC in the last 72 hours.  Recent Labs  11/12/15 1341 11/12/15 2027 11/13/15 0217 11/13/15 0824  TROPONINI <0.03 0.03 <0.03 <0.03   Lab Results  Component Value Date   WBC 8.1 11/13/2015   HGB 7.7* 11/13/2015   HCT 25.4* 11/13/2015   MCV 89.4 11/13/2015   PLT 209 11/13/2015    Recent Labs Lab 11/10/15 1647  11/13/15 0217  NA 138  < > 137  K 4.8  < > 5.3*  CL 107  < > 104  CO2 21*  < >  24  BUN 56*  < > 77*  CREATININE 2.45*  < > 2.58*  CALCIUM 9.1  < > 8.3*  PROT 6.6  --   --   BILITOT 0.4  --   --   ALKPHOS 63  --   --   ALT 15  --   --   AST 16  --   --   GLUCOSE 138*  < > 156*  < > = values in this interval not displayed. Lab Results  Component Value Date   CHOL  04/30/2009    142        ATP III CLASSIFICATION:  <200     mg/dL   Desirable  200-239  mg/dL   Borderline High  >=240    mg/dL   High          HDL 41 04/30/2009   LDLCALC  04/30/2009    71        Total Cholesterol/HDL:CHD Risk Coronary Heart Disease Risk Table                     Men   Women  1/2 Average Risk   3.4   3.3  Average Risk       5.0   4.4  2 X Average Risk   9.6   7.1  3 X Average Risk  23.4   11.0        Use the calculated Patient Ratio above and the CHD Risk Table to determine the patient's CHD Risk.        ATP III CLASSIFICATION (LDL):  <100     mg/dL   Optimal  100-129  mg/dL   Near or Above                    Optimal  130-159  mg/dL   Borderline  160-189  mg/dL   High  >190     mg/dL   Very High   TRIG 151* 04/30/2009   Lab Results  Component Value Date   DDIMER 1.00* 11/10/2015     Radiology Studies    Dg Chest 2 View  11/10/2015  CLINICAL DATA:  Shortness of breath EXAM: CHEST  2 VIEW COMPARISON:  10/11/2015 FINDINGS: Chronic cardiopericardial enlargement. Dialysis catheter has been removed. Prominence of the pulmonary arterial contour, chronic.  Pulmonary venous congestion that is similar to prior. Trace left pleural effusion. Focal opacity at the medial right base with new obscuration of the medial diaphragm. This has a streaky appearance in the lateral projection. Prior median sternotomy for CABG. IMPRESSION: Mild CHF. Atelectasis or bronchopneumonia at the medial right base. Electronically Signed   By: Monte Fantasia M.D.   On: 11/10/2015 15:12    ECG & Cardiac Imaging    ECHO: 10/02/2015  Study Conclusions  - Left ventricle: The cavity size was normal. There was mild focal  basal hypertrophy of the septum. Systolic function was normal.  The estimated ejection fraction was in the range of 55% to 60%.  Wall motion was normal; there were no regional wall motion  abnormalities. There was a reduced contribution of atrial  contraction to ventricular filling, due to increased ventricular  diastolic pressure or atrial contractile dysfunction. Doppler  parameters are consistent with a reversible restrictive pattern,  indicative of decreased left ventricular diastolic compliance  and/or increased left atrial pressure (grade 3 diastolic  dysfunction). Doppler parameters are consistent with high  ventricular filling pressure. - Aortic valve: Severe diffuse  thickening and calcification,  consistent with sclerosis. There was mild regurgitation. Valve  area (VTI): 1.93 cm^2. Valve area (Vmax): 1.83 cm^2. Valve area  (Vmean): 1.97 cm^2. - Mitral valve: Severely calcified annulus. Mild diffuse  calcification, with moderate involvement of chords. There was  mild regurgitation. Valve area by continuity equation (using LVOT  flow): 1.87 cm^2. - Left atrium: The atrium was severely dilated. - Tricuspid valve: There was moderate regurgitation. - Pulmonary arteries: PA peak pressure: 69 mm Hg (S).  Impressions:  - The right ventricular systolic pressure was increased consistent  with severe pulmonary  hypertension.   EKG: SR RBBB with non-specific T wave abnormalities, noted on previous tracing.    Assessment & Plan    Mrs. Guerrera is a 70 yo female of Dr. Lynnell Jude with PMH of DM II/HTN/HLD/PVD/CKD IV/A-fib (on coumadin)/CAD s/p CABG 2005 and chronic Diastolic CHF. She was last seen in the office 10/27/2015 by Jory Sims for follow up after recent hospitalization for chest pain. During that admission she had a 2D echo showing an EF of 55-60% with NWMA, and underwent stress testing showing an anterior defect with concerns for peri infarct ischemia, but cardiac cath was deferred given her renal function and managed medically. Also exhibited PAF and had a DCCV by Dr. Oval Linsey.   Presented to the [facility] ED with increased DOE, orthopnea, and PND. In the ED her labs showed elevated BNP 668, Cr 2.45, trop 0.13, Hgb 9.2, and INR 2.5. Chest x-ray showed mild CHF. She was started on IV lasix 40 mg BID and admitted to Internal medicine for further diuresis. Cr has began to increase and her lasix dose was cut back to 40 IV daily.   1. Acute on chronic CHF: Was initially placed on 40mg  IV Lasix BID and was cut back to daily given her worsening renal function.  --Cr 2.38>>2.45>>2.58, reports she does weight herself daily and believes her dry weight is around 230. Weights documented in the computer are fluctuating. UOP since admission is around 5L, but no physical exam the she clearly remains overloaded, with diminished lung sounds, and 3+edema in bilateral LE.  --Last echo in 09/2015 showed an EF of 55-60%, so no need to repeat one at this time.  --She definitely needs continued diuresis, given her symptoms have not improved much and is still hypervolemic. Would increase her Lasix to 80mg  BID and consult nephrology for assistance with further management given her known CKD.   2.  HTN: Reading are high during this admission. Maybe related to her hypervolemic state. Currently on BB, CCB, BiDil  Continue to monitor  3. A-fib (on Coumadin): continue amiodarone, may need to decrease her BB given she is bradycardiac on telemetry between 40-50, but sinus rhythm.  This patients CHA2DS2-VASc Score and unadjusted Ischemic Stroke Rate (% per year) is equal to 7.2 % stroke rate/year from a score of 5 Above score calculated as 1 point each if present [CHF, HTN, DM, Vascular=MI/PAD/Aortic Plaque, Age if 65-74, or Female] Above score calculated as 2 points each if present [Age > 75, or Stroke/TIA/TE]  4. CAD s/p CABG 2005:   SignedReino Bellis, NP-C Pager [phone] 11/13/2015, 12:39 PM As above, patient seen and examined. Briefly she is a 70 year old female with past medical history of coronary artery disease status post coronary artery bypass and graft, diastolic congestive heart failure, chronic  Stage IV kidney disease, diabetes mellitus, atrial fibrillation for evaluation of acute on chronic diastolic congestive heart failure. Last echocardiogram May 2017  showed normal LV function, mild aortic and mitral regurgitation and moderate to severe left atrial enlargement. Moderate tricuspid regurgitation. Nuclear study showed ejection fraction 45%, prior anterior infarct and mild peri-infarct ischemia. Treated medically because of renal insufficiency. Presents with complaints of 3 days increasing dyspnea on exertion, orthopnea, weight gain of 10 pounds and lower extremity edema. Markedly volume overloaded on exam. BUN 77 and creatinine 2.58.  1 acute on chronic diastolic congestive heart failure-markedly volume overloaded on examination. Most likely due to combination of diastolic dysfunction and renal insufficiency. Increase Lasix to 80 mg twice a day. 2 Chronic kidney disease stage IV-her renal function will likely worsen with diuresis. Would ask nephrology to follow. She will likely require dialysis in the near future.  3 paroxysmal atrial fibrillation-Status post recent cardioversion in may.  CHADSvasc 6; continue coumadin; continue amiodarone. 4 coronary artery disease-discontinue aspirin given need for Coumadin. Continue statin. Previous nuclear study abnormal but we are treating medically given severity of renal insufficiency and risk of contrast nephropathy. 5 hypertension-continue present medications. Kirk Ruths

## 2015-11-13 NOTE — NC FL2 (Signed)
[facility] LEVEL OF CARE SCREENING TOOL     IDENTIFICATION  Patient Name: Briana [name] Birthdate: 20-Dec-1945 Sex: female Admission Date (Current Location): 11/10/2015  [facility] and [place] Number:  Manufacturing engineer and Address:  [facility], [place] [address] [place], [place] [address]      Provider Number: [id number]  Attending Physician Name and Address:  Robbie Lis, MD  Relative Name and Phone Number:       Current Level of Care: Hospital Recommended Level of Care: [place] Prior Approval Number:    Date Approved/Denied:   PASRR Number: [id number]  Discharge Plan: SNF    Current Diagnoses: Patient Active Problem List   Diagnosis Date Noted  . Acute on chronic diastolic (congestive) heart failure ([place]) 11/10/2015  . Depression   . Hypoalbuminemia due to protein-calorie malnutrition ([place])   . Debility 10/18/2015  . Abnormality of gait   . Long term current use of anticoagulant therapy   . Chronic pain syndrome   . Coronary artery disease involving native coronary artery of native heart without angina pectoris   . Hx of non-ST elevation myocardial infarction (NSTEMI)   . Paroxysmal atrial fibrillation (HCC)   . Diabetes mellitus type 2 in obese ([place])   . Chronic kidney disease (CKD), stage IV (severe) ([place])   . Benign essential HTN   . AKI (acute kidney injury) ([place])   . Non-ST elevation myocardial infarction (NSTEMI) ([place]) 10/14/2015  . Pulmonary edema   . Abnormal nuclear stress test   . Acute renal failure with tubular necrosis ([place])   . Hypertensive emergency   . Atrial fibrillation with rapid ventricular response ([place]) 10/05/2015  . CAP (community acquired pneumonia)   . CAD in native artery   . Pulmonary hypertension ([place])   . Atrial fibrillation with RVR ([place])   . Acute on chronic renal failure ([place])   . Uncontrolled type 2 diabetes mellitus with complication ([place])   . Physical  deconditioning   . Acute respiratory failure with hypoxia ([place])   . Acute pulmonary edema (HCC)   . Diastolic CHF, acute on chronic (HCC)   . Acute blood loss anemia   . Controlled diabetes mellitus type 2 with complications ([place])   . Acute respiratory failure ([place]) 10/01/2015  . Hypoxia 10/01/2015  . Elevated troponin 10/01/2015  . Chest pain   . DVT (deep venous thrombosis) ([place])   . Hypoxemia   . Acute on chronic diastolic CHF (congestive heart failure) ([place]) 05/31/2015  . Essential hypertension, benign 03/28/2015  . Hyperlipidemia 03/28/2015  . Vitamin D deficiency 03/28/2015  . Acute renal failure ([place]) 03/04/2014  . UTI (lower urinary tract infection) 03/02/2014  . Acute encephalopathy 03/02/2014  . Dehydration 03/02/2014  . Generalized weakness 03/02/2014  . Morbid obesity ([place]) 03/02/2014  . Depressed mood 03/02/2014  . Type 2 diabetes mellitus with stage 3 chronic kidney disease ([place]) 03/02/2014  . Hx of adenomatous colonic polyps 01/28/2012  . Occlusion and stenosis of carotid artery without mention of cerebral infarction 12/03/2011    Orientation RESPIRATION BLADDER Height & Weight     Self, Time, Situation, Place  O2 (Nasal Canula 5 L) Continent Weight: 239 lb 3.2 oz (108.5 kg) Height:  5\' 4"  (162.6 cm)  BEHAVIORAL SYMPTOMS/MOOD NEUROLOGICAL BOWEL NUTRITION STATUS   (None)  (None) Continent Diet (Heart healthy)  AMBULATORY STATUS COMMUNICATION OF NEEDS Skin   Limited Assist Verbally Other (Comment) (Ecchymosis on abdomen)  Personal Care Assistance Level of Assistance  Bathing, Feeding, Dressing Bathing Assistance: Limited assistance Feeding assistance: Independent Dressing Assistance: Limited assistance     Functional Limitations Info  Sight, Speech, Hearing Sight Info: Adequate Hearing Info: Adequate Speech Info: Adequate    SPECIAL CARE FACTORS FREQUENCY  PT (By licensed PT), Diabetic urine testing, Blood pressure     PT  Frequency: 5 x week              Contractures Contractures Info: Not present    Additional Factors Info  Code Status, Allergies, Psychotropic Code Status Info: Full Allergies Info: Carbamazepine, Clonazepam, Gemfibrozil, Macrobid, Oxycodone Psychotropic Info: Depression         Current Medications (11/13/2015):  This is the current hospital active medication list Current Facility-Administered Medications  Medication Dose Route Frequency Provider Last Rate Last Dose  . 0.9 %  sodium chloride infusion  250 mL Intravenous PRN Etta Quill, DO      . acetaminophen (TYLENOL) tablet 650 mg  650 mg Oral Q4H PRN Etta Quill, DO      . amiodarone (PACERONE) tablet 200 mg  200 mg Oral Daily Etta Quill, DO   200 mg at 11/13/15 1043  . amLODipine (NORVASC) tablet 10 mg  10 mg Oral Daily Etta Quill, DO   10 mg at 11/13/15 1043  . aspirin EC tablet 81 mg  81 mg Oral Daily Etta Quill, DO   81 mg at 11/13/15 1043  . atorvastatin (LIPITOR) tablet 80 mg  80 mg Oral Daily Etta Quill, DO   80 mg at 11/13/15 1044  . calcium acetate (PHOSLO) capsule 1,334 mg  1,334 mg Oral TID WC Etta Quill, DO   1,334 mg at 11/13/15 0843  . docusate sodium (COLACE) capsule 100 mg  100 mg Oral BID PRN Clanford L Johnson, MD      . furosemide (LASIX) injection 40 mg  40 mg Intravenous Daily Clanford Marisa Hua, MD   40 mg at 11/13/15 1043  . HYDROcodone-acetaminophen (NORCO) 7.5-325 MG per tablet 1 tablet  1 tablet Oral Q4H PRN Etta Quill, DO   1 tablet at 11/12/15 2244  . hydrocortisone (ANUSOL-HC) 2.5 % rectal cream   Rectal BID PRN Clanford L Johnson, MD      . insulin aspart protamine- aspart (NOVOLOG MIX 70/30) injection 15 Units  15 Units Subcutaneous BID WC Etta Quill, DO   15 Units at 11/13/15 0843  . isosorbide-hydrALAZINE (BIDIL) 20-37.5 MG per tablet 2 tablet  2 tablet Oral TID Etta Quill, DO   2 tablet at 11/13/15 1043  . metoprolol tartrate (LOPRESSOR) tablet 25  mg  25 mg Oral BID Etta Quill, DO   25 mg at 11/13/15 1043  . ondansetron (ZOFRAN) injection 4 mg  4 mg Intravenous Q6H PRN Etta Quill, DO      . protein supplement (RESOURCE BENEPROTEIN) powder 6 g  1 scoop Oral TID WC Etta Quill, DO   6 g at 11/13/15 0800  . senna (SENOKOT) tablet 8.6 mg  1 tablet Oral Daily PRN Clanford L Johnson, MD      . sodium chloride flush (NS) 0.9 % injection 3 mL  3 mL Intravenous Q12H Etta Quill, DO   3 mL at 11/13/15 1000  . sodium chloride flush (NS) 0.9 % injection 3 mL  3 mL Intravenous PRN Etta Quill, DO   3 mL at 11/11/15 2219  .  Warfarin - Pharmacist Dosing Inpatient   Does not apply [place], [facility]         Discharge Medications: Please see discharge summary for a list of discharge medications.  Relevant Imaging Results:  Relevant Lab Results:   Additional Information SS#: [id number]  Candie Chroman, LCSW

## 2015-11-13 NOTE — Clinical Social Work Note (Signed)
Clinical Social Work Assessment  Patient Details  Name: Briana [name] MRN: [id number] Date of Birth: 22-Nov-1945  Date of referral:  11/13/15               Reason for consult:  Facility Placement, Discharge Planning                Permission sought to share information with:  Facility Sport and exercise psychologist, Family Supports Permission granted to share information::  Yes, Verbal Permission Granted  Name::     Levy Pupa  Agency::  SNF's  Relationship::  Daughter  Contact Information:  [phone]  Housing/Transportation Living arrangements for the past 2 months:  Single Family Home Source of Information:  Patient, Medical Team Patient Interpreter Needed:  None Criminal Activity/Legal Involvement Pertinent to Current Situation/Hospitalization:  No - Comment as needed Significant Relationships:  Spouse, Adult Children, Other Family Members Lives with:  Spouse Do you feel safe going back to the place where you live?  Yes Need for family participation in patient care:  Yes (Comment)  Care giving concerns:  PT recommending SNF once medically stable for discharge.   Social Worker assessment / plan:  CSW met with patient. No supports at bedside. CSW introduced role and explained that discharge planning would be discussed. Patient is familiar with CSW due to need for SNF backup at last admission. Patient was later discharged to CIR. Patient said CIR went well but she came home for 3 days and found herself short of breath. She was later readmitted. Discussed PT recommendations for SNF. Patient agreeable. Discussed preferences. No further concerns. CSW encouraged patient to contact CSW as needed. CSW will continue to follow patient and facilitate discharge to SNF once medically stable.  Employment status:  Retired Forensic scientist:  Medicare PT Recommendations:  [place] / Referral to community resources:  [place]  Patient/Family's  Response to care:  Patient agreeable to SNF placement following discharge, stating that rehab helped her after her last admission. Patient's husband has dementia but her daughter is supportive and involved in her care. Patient appreciated social work intervention.  Patient/Family's Understanding of and Emotional Response to Diagnosis, Current Treatment, and Prognosis:  CSW facilitated questions about why patient was readmitted to the hospital. Patient knowledgeable of reasons for readmission and need for SNF once discharged.   Emotional Assessment Appearance:  Appears stated age Attitude/Demeanor/Rapport:   (Pleasant) Affect (typically observed):  Accepting, Appropriate, Calm, Pleasant Orientation:  Oriented to Self, Oriented to Place, Oriented to  Time, Oriented to Situation Alcohol / Substance use:  Never Used Psych involvement (Current and /or in the community):  No (Comment)  Discharge Needs  Concerns to be addressed:  Care Coordination Readmission within the last 30 days:  Yes Current discharge risk:  Dependent with Mobility Barriers to Discharge:  No Barriers Identified   Candie Chroman, LCSW 11/13/2015, 11:38 AM

## 2015-11-13 NOTE — Progress Notes (Signed)
ANTICOAGULATION CONSULT NOTE - F/U Consult  Pharmacy Consult for warfarin Indication: atrial fibrillation  Allergies  Allergen Reactions  . Carbamazepine Other (See Comments)    Reaction to tegretol - loopy  . Clonazepam Other (See Comments)    Unknown reaction  . Gemfibrozil Other (See Comments)    Patient is not aware of this allergy but states that she has side effects to a cholesterol medication in the past  . Macrobid [Nitrofurantoin] Nausea And Vomiting  . Oxycodone Other (See Comments)    hallucinations    Patient Measurements: Height: 5\' 4"  (162.6 cm) Weight: 239 lb 3.2 oz (108.5 kg) IBW/kg (Calculated) : 54.7  Vital Signs: Temp: 98.4 F (36.9 C) (06/19 1158) Temp Source: Oral (06/19 1158) BP: 151/44 mmHg (06/19 1158) Pulse Rate: 58 (06/19 1158)  Labs:  Recent Labs  11/10/15 1647  11/11/15 0327  11/12/15 0232  11/12/15 2027 11/13/15 0217 11/13/15 0824  HGB 9.2*  --   --   --   --   --   --  7.7*  --   HCT 31.3*  --   --   --   --   --   --  25.4*  --   PLT 229  --   --   --   --   --   --  209  --   LABPROT  --   < > 29.3*  --  26.5*  --   --  22.2*  --   INR  --   < > 2.83*  --  2.47*  --   --  1.96*  --   CREATININE 2.45*  --  2.38*  --  2.45*  --   --  2.58*  --   TROPONINI 0.13*  < > 0.03  < > <0.03  < > 0.03 <0.03 <0.03  < > = values in this interval not displayed.  Estimated Creatinine Clearance: 24.4 mL/min (by C-G formula based on Cr of 2.58).  Assessment: 70 y.o. female who presents for evaluation of shortness of breath, orthopnea, and peripheral edema.  PMH: CHF, DM, HTN, CKDIV Recent admit 5/6-5/24 for PNA, and CHF exacerbation.  AC/Heme: warfarin for Hx Afib. INR 1.96  PTA: 5mg  daily except 2.5mg  on Tuesday and Thursday  Nephro: SCr 2.58  Heme: H&H 7.7/25.4, Plt 209  Goal of Therapy:  INR 2-3 Monitor platelets by anticoagulation protocol: Yes   Plan:  Warfarin 7.5 mg x 1 Daily INR, CBC q72h  Monitor s/sx of  bleeding  Levester Fresh, PharmD, BCPS, [facility] Clinical Pharmacist Pager [phone] 11/13/2015 12:49 PM

## 2015-11-13 NOTE — Evaluation (Signed)
Occupational Therapy Evaluation Patient Details Name: Briana [name] MRN: [id number] DOB: [birth year] Today's Date: 11/13/2015    History of Present Illness Pt is a 70 y/o F who was just admitted last month for multi week (5/6-5/24) hospital stay that started with treatment in the ICU for PNA, followed by diuresis for CHF exacerbation. This was then complicated further by A.Fib RVR which was unstable and required cardioversion. Around that time she also had AKF develop that thankfully would resolve. Also had NSTEMI with trop elevation during that stay that was managed medically, no cath due to kidney function.Pt presents this admission w/ c/o SOB, peripheral edema, and DOE.  Admitting Dx: acute on chronic diastolic CHF.  Pt's PMH includes PVD, depression, anxiety, neuropathy, a-fib.   Clinical Impression   Patient presenting with decreased I in self care, balance, functional mobility/transfers, and decreased activity tolerance. Patient reports being mod I  PTA. She recently discharged from hospital 2-3 weeks ago, was receiving home health therapy, and was caregiver for husband. Patient currently functioning at min A. Patient will benefit from acute OT to increase overall independence in the areas of ADLs, functional mobility, and safety in order to safely discharge to next venue of care.    Follow Up Recommendations  SNF    Equipment Recommendations  Other (comment) (defer to next venue of care.)    Recommendations for Other Services       Precautions / Restrictions Precautions Precautions: Fall Precaution Comments: O2 drops with activity Restrictions Weight Bearing Restrictions: No      Mobility Bed Mobility    General bed mobility comments: Pt sitting in recliner chair upon PT arrival  Transfers Overall transfer level: Needs assistance Equipment used: None Transfers: Sit to/from Stand;Stand Pivot Transfers Sit to Stand: Min assist Stand pivot transfers: Min assist         Balance Overall balance assessment: Needs assistance Sitting-balance support: Feet supported;No upper extremity supported Sitting balance-Leahy Scale: Fair     Standing balance support: No upper extremity supported;During functional activity Standing balance-Leahy Scale: Poor Standing balance comment: steady assist for balance       ADL Overall ADL's : Needs assistance/impaired     Grooming: Wash/dry hands;Wash/dry face;Oral care;Brushing hair;Min guard;Standing   Upper Body Bathing: Set up;Sitting   Lower Body Bathing: Minimal assistance;Sit to/from stand   Upper Body Dressing : Set up;Sitting   Lower Body Dressing: Minimal assistance;Sit to/from stand   Toilet Transfer: Minimal assistance;Grab bars;Ambulation;Comfort height toilet   Toileting- Clothing Manipulation and Hygiene: Minimal assistance;Sit to/from stand         General ADL Comments: Upon entering the room, pt seated in recliner chair with no c/o pain this session. Pt ambulating without use of AD while managing O2 with steady assistance for safety. Pt performed transfer onto elevated toilet with use of grab bar and steady assist for balance. Pt returning to recliner chair in same manner. O2 saturation at 87% while at 5 L of O2 via [place]. Pt  educated  on pursed lip breathing with min verbal cues for proper technique when returning demonstration.                Pertinent Vitals/Pain Pain Assessment: No/denies pain     Hand Dominance Right   Extremity/Trunk Assessment Upper Extremity Assessment Upper Extremity Assessment: Generalized weakness   Lower Extremity Assessment Lower Extremity Assessment: Generalized weakness       Communication Communication Communication: No difficulties   Cognition Arousal/Alertness: Awake/alert Behavior During Therapy: WFL for  tasks assessed/performed Overall Cognitive Status: Within Functional Limits for tasks assessed                   Home Living  Family/patient expects to be discharged to:: Skilled nursing facility Living Arrangements: Spouse/significant other Available Help at Discharge: Family;Available PRN/intermittently (daughter lives nearby) Type of Home: House Home Access: Ramped entrance     Home Layout: One level     Bathroom Shower/Tub: Walk-in shower;Curtain   Bathroom Toilet: Handicapped height     Home Equipment: Bedside commode;Shower seat;Grab bars - tub/shower;Hand held shower head;Cane - single point   Additional Comments: Pt has been caring for her husband who has dementia and has been detoxing and this has worn her out.  Husband was in hospital recently for 1 wk. Pt was recently in inpatient rehab 2-3 weeks ago.  Lives With: Spouse    Prior Functioning/Environment Level of Independence: Independent with assistive device(s)        Comments: Ind w/ bathing, dressing.  Has been engaging in [place] on 2-4L O2.     OT Diagnosis: Generalized weakness   OT Problem List: Decreased strength;Decreased activity tolerance;Impaired balance (sitting and/or standing);Decreased safety awareness;Decreased knowledge of use of DME or AE   OT Treatment/Interventions: Self-care/ADL training;Therapeutic exercise;Energy conservation;DME and/or AE instruction;Patient/family education;Therapeutic activities;Balance training    OT Goals(Current goals can be found in the care plan section) Acute Rehab OT Goals Patient Stated Goal: to feel better and return home OT Goal Formulation: With patient Time For Goal Achievement: 11/27/15 Potential to Achieve Goals: Fair ADL Goals Pt Will Perform Grooming: with supervision Pt Will Perform Upper Body Bathing: with supervision Pt Will Perform Lower Body Bathing: with supervision Pt Will Perform Upper Body Dressing: with supervision Pt Will Perform Lower Body Dressing: with supervision;sit to/from stand Pt Will Transfer to Toilet: with supervision;ambulating;regular height  toilet Pt Will Perform Toileting - Clothing Manipulation and hygiene: with supervision;sit to/from stand  OT Frequency: Min 2X/week   Barriers to D/C:    Pt is caregiver for husband.          End of Session Equipment Utilized During Treatment: Oxygen  Activity Tolerance: Patient limited by fatigue Patient left: in chair;with call bell/phone within reach   Time: 1327-1350 OT Time Calculation (min): 23 min Charges:  OT Evaluation $OT Eval Moderate Complexity: 1 Procedure OT Treatments $Self Care/Home Management : 8-22 mins G-Codes:    Phineas Semen, MS, OTR/L 11/13/2015, 2:29 PM

## 2015-11-14 DIAGNOSIS — D631 Anemia in chronic kidney disease: Secondary | ICD-10-CM | POA: Insufficient documentation

## 2015-11-14 DIAGNOSIS — N189 Chronic kidney disease, unspecified: Secondary | ICD-10-CM

## 2015-11-14 DIAGNOSIS — I10 Essential (primary) hypertension: Secondary | ICD-10-CM | POA: Insufficient documentation

## 2015-11-14 LAB — BASIC METABOLIC PANEL
ANION GAP: 9 (ref 5–15)
BUN: 82 mg/dL — ABNORMAL HIGH (ref 6–20)
CHLORIDE: 104 mmol/L (ref 101–111)
CO2: 25 mmol/L (ref 22–32)
CREATININE: 2.85 mg/dL — AB (ref 0.44–1.00)
Calcium: 8.3 mg/dL — ABNORMAL LOW (ref 8.9–10.3)
GFR calc non Af Amer: 16 mL/min — ABNORMAL LOW (ref >60)
GFR, EST AFRICAN AMERICAN: 18 mL/min — AB (ref >60)
Glucose, Bld: 163 mg/dL — ABNORMAL HIGH (ref 65–99)
POTASSIUM: 5.2 mmol/L — AB (ref 3.5–5.1)
SODIUM: 138 mmol/L (ref 135–145)

## 2015-11-14 LAB — PROTIME-INR
INR: 1.83 — AB (ref 0.00–1.49)
PROTHROMBIN TIME: 21.1 s — AB (ref 11.6–15.2)

## 2015-11-14 LAB — GLUCOSE, CAPILLARY
GLUCOSE-CAPILLARY: 200 mg/dL — AB (ref 65–99)
GLUCOSE-CAPILLARY: 291 mg/dL — AB (ref 65–99)
GLUCOSE-CAPILLARY: 220 mg/dL — AB (ref 65–99)
GLUCOSE-CAPILLARY: 129 mg/dL — AB (ref 65–99)

## 2015-11-14 LAB — CBC
HEMATOCRIT: 26 % — AB (ref 36.0–46.0)
HEMOGLOBIN: 7.8 g/dL — AB (ref 12.0–15.0)
MCH: 27.5 pg (ref 26.0–34.0)
MCHC: 30 g/dL (ref 30.0–36.0)
MCV: 91.5 fL (ref 78.0–100.0)
Platelets: 215 10*3/uL (ref 150–400)
RBC: 2.84 MIL/uL — AB (ref 3.87–5.11)
RDW: 16.6 % — ABNORMAL HIGH (ref 11.5–15.5)
WBC: 8.3 10*3/uL (ref 4.0–10.5)

## 2015-11-14 MED ORDER — WARFARIN SODIUM 5 MG PO TABS
5.0000 mg | ORAL_TABLET | Freq: Once | ORAL | Status: AC
  Administered 2015-11-14: 5 mg via ORAL
  Filled 2015-11-14: qty 1

## 2015-11-14 MED ORDER — INSULIN ASPART 100 UNIT/ML ~~LOC~~ SOLN
0.0000 [IU] | Freq: Three times a day (TID) | SUBCUTANEOUS | Status: DC
  Administered 2015-11-14 – 2015-11-15 (×3): 5 [IU] via SUBCUTANEOUS
  Administered 2015-11-15: 2 [IU] via SUBCUTANEOUS
  Administered 2015-11-16 (×2): 3 [IU] via SUBCUTANEOUS
  Administered 2015-11-16: 5 [IU] via SUBCUTANEOUS
  Administered 2015-11-17: 8 [IU] via SUBCUTANEOUS
  Administered 2015-11-17: 3 [IU] via SUBCUTANEOUS
  Administered 2015-11-18: 5 [IU] via SUBCUTANEOUS
  Administered 2015-11-18 – 2015-11-19 (×3): 3 [IU] via SUBCUTANEOUS

## 2015-11-14 MED ORDER — DOCUSATE SODIUM 100 MG PO CAPS
100.0000 mg | ORAL_CAPSULE | Freq: Two times a day (BID) | ORAL | Status: DC
  Administered 2015-11-14 – 2015-12-10 (×46): 100 mg via ORAL
  Filled 2015-11-14 (×48): qty 1

## 2015-11-14 MED ORDER — INSULIN ASPART 100 UNIT/ML ~~LOC~~ SOLN
0.0000 [IU] | Freq: Every day | SUBCUTANEOUS | Status: DC
  Administered 2015-11-16: 3 [IU] via SUBCUTANEOUS
  Administered 2015-11-18: 2 [IU] via SUBCUTANEOUS

## 2015-11-14 MED ORDER — FUROSEMIDE 10 MG/ML IJ SOLN
80.0000 mg | Freq: Two times a day (BID) | INTRAMUSCULAR | Status: DC
  Administered 2015-11-14 – 2015-11-15 (×3): 80 mg via INTRAVENOUS
  Filled 2015-11-14 (×3): qty 8

## 2015-11-14 MED ORDER — BISACODYL 10 MG RE SUPP: 10.0000 mg | Freq: Once | RECTAL | Status: DC

## 2015-11-14 MED ORDER — POLYETHYLENE GLYCOL 3350 17 G PO PACK
17.0000 g | PACK | Freq: Two times a day (BID) | ORAL | Status: DC
  Administered 2015-11-18 – 2015-12-10 (×11): 17 g via ORAL
  Filled 2015-11-14 (×31): qty 1

## 2015-11-14 NOTE — Progress Notes (Signed)
Heart Failure Navigator Consult Note  Presentation: Briana [name] is a 70 y.o. female with medical history significant of CHF (grade 3 diastolic dysfunction), DM, HTN, CKD now stage 4. She was just admitted last month for multi week (5/6-5/24) hospital stay that started with treatment in the ICU for PNA, followed by diuresis for CHF exacerbation. This was then complicated further by A.Fib RVR which was unstable and required cardioversion. Around that time she also had AKF develop that thankfully would resolve. Also had NSTEMI with trop elevation during that stay that was managed medically, no cath due to kidney function. For a full recounting of the hospital stay please see Dr. Ledell Peoples discharge summary dated 10/18/15. Following the hospital stay she was in [facility].  Patient presents to the ED today with 2 week history of gradually worsening SOB, peripheral edema, orthopnea, DOE. She saw PCP earlier this week and was advised to start taking lasix BID (had been on 40mg  PO Qday since hospital discharge), but has only done this for 2 days thus far. No CP, nausea, fever, vomiting, chills, etc.   Past Medical History  Diagnosis Date  . Diabetes mellitus   . Hypertension   . Hyperlipidemia   . Heart murmur   . Peripheral vascular disease ([place])   . Arthritis   . Depression with anxiety   . Carotid artery occlusion   . Chronic kidney disease   . Neuropathy ([place])   . CAD (coronary artery disease)   . Rheumatic fever   . Sleep apnea   . PAD (peripheral artery disease) ([place])   . Adenomatous colon polyp 09/2007  . Atrial fibrillation with rapid ventricular response ([place]) 10/05/2015    Social History   Social History  . Marital Status: Married    Spouse Name: N/A  . Number of Children: N/A  . Years of Education: N/A   Social History Main Topics  . Smoking status: Never Smoker   . Smokeless tobacco: Never Used  . Alcohol Use: No  . Drug Use: No  . Sexual Activity: Not  Asked   Other Topics Concern  . None   Social History Narrative    ECHO:Study Conclusions--10/02/15  - Left ventricle: The cavity size was normal. There was mild focal  basal hypertrophy of the septum. Systolic function was normal.  The estimated ejection fraction was in the range of 55% to 60%.  Wall motion was normal; there were no regional wall motion  abnormalities. There was a reduced contribution of atrial  contraction to ventricular filling, due to increased ventricular  diastolic pressure or atrial contractile dysfunction. Doppler  parameters are consistent with a reversible restrictive pattern,  indicative of decreased left ventricular diastolic compliance  and/or increased left atrial pressure (grade 3 diastolic  dysfunction). Doppler parameters are consistent with high  ventricular filling pressure. - Aortic valve: Severe diffuse thickening and calcification,  consistent with sclerosis. There was mild regurgitation. Valve  area (VTI): 1.93 cm^2. Valve area (Vmax): 1.83 cm^2. Valve area  (Vmean): 1.97 cm^2. - Mitral valve: Severely calcified annulus. Mild diffuse  calcification, with moderate involvement of chords. There was  mild regurgitation. Valve area by continuity equation (using LVOT  flow): 1.87 cm^2. - Left atrium: The atrium was severely dilated. - Tricuspid valve: There was moderate regurgitation. - Pulmonary arteries: PA peak pressure: 69 mm Hg (S).  Impressions:  - The right ventricular systolic pressure was increased consistent  with severe pulmonary hypertension.  Transthoracic echocardiography. M-mode, complete 2D, spectral  Doppler, and color Doppler. Birthdate: Patient birthdate: January 26, 1946. Age: Patient is 70 yr old. Sex: Gender: female. BMI: 38 kg/m^2. Blood pressure: 145/48 Patient status: Inpatient. Study date: Study date: 10/02/2015. Study time: 10:52 AM. Location: ICU/CCU  BNP    Component Value  Date/Time   BNP 668.4* 11/10/2015 1647    ProBNP    Component Value Date/Time   PROBNP 6284.0* 03/02/2014 1933     Education Assessment and Provision:  Detailed education and instructions provided on heart failure disease management including the following:  Signs and symptoms of Heart Failure When to call the physician Importance of daily weights Low sodium diet Fluid restriction Medication management Anticipated future follow-up appointments  Patient education given on each of the above topics.  Patient acknowledges understanding and acceptance of all instructions.  I spoke with patient and daughter at length regarding patient's HF and current hospitalization.  She tells me that she "was just here" with HF.  She left and went home where she helps to care for her husband who is demented.  Her daughter says that she was doing well recovering while he was hospitalized and as soon as he was back home her mother became "stressed" and began to decline.  She does weigh everyday and we reviewed when to contact the physician related to weight increases.   Her daughter does all of her cooking and food preparation--she says that they are very careful with Sodium intake.  I reminded them that most sodium comes from packaged /processed foods and to limit to 2000 mg of Sodium or less per day.  They deny any issues with getting or taking prescribed medications.  She follows with [facility] in [place]  Education Materials:  "Living Better With Heart Failure" Booklet, Daily Weight Tracker Tool   High Risk Criteria for Readmission and/or Poor Patient Outcomes:   EF <30%- No 55-60% with grade 3 dias dys  2 or more admissions in 6 months- yes -readmit from last month  Difficult social situation- Yes- helps to care for demented husband at home  Demonstrates medication noncompliance- No denies   Barriers of Care:  Knowledge and compliance  Discharge Planning:   Plans to discharge to  Rehab at [facility] near her home in [place] and then potentially back home with husband.

## 2015-11-14 NOTE — Progress Notes (Addendum)
Patient ID: Briana [name], female   DOB: 11/25/1945, 70 y.o.   MRN: [id number]  PROGRESS NOTE    KOLBE HOXWORTH  [id number] DOB: 1945/10/26 DOA: 11/10/2015  PCP: [name]   Brief Narrative:  70 y.o. female with past medical history significant for chronic diastolic CHF (in [id number] with EF 55%), hypertension, dyslipidemia, atrial fibrillation on anticoagulation with Coumadin, recently hospitalized 5/6-5/24 of 2017 for pneumonia which was then complicated further by A.Fib RVR which was unstable and required cardioversion. She also had NSTEMI which was managed medically and no cardiac cath due to acute kidney failure. She was discharged to inpatient rehab.   Patient presented to [facility] with worsening shortness of breath over past 2 weeks prior to this admission, or extremity edema. She was seen by primary care physician the week prior to the admission and was instructed to take Lasix twice daily whereas before she was on 40 mg once a day Lasix. There was no significant symptomatic relief for which reason she presented to ED for further evaluation.   Assessment & Plan:   Acute on chronic diastolic CHF  - Has had TEE for cardioversion in 09/2015 which showed EF 55% - BNP on this admission 668  - Weight in past 72 hours: 107.54 kg --> 108.5 kg --> 109.4 kg - Cardio increased lasix to 80 mg IV daily - Continue daily weight and strict intake and output  - Cardiology following   CKD stage 4 - Recent creatinine 3 weeks ago was 2.75 - Creatinine 2.38 on this admission - Cr 2.8 this am - Continue phoslo   Anemia of chronic kidney disease - Hemoglobin 7.8 this am - No reports of bleeding - Check CBC in am  Atrial fibrillation  - CHADS vasc score 5 - Recent TEE 09/2015 done for cardioversion - Continue amiodarone 200 mg daily and metoprolol 25 mg twice daily.  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 - Continue normal lobe 70/30 mix 15 units twice daily - Sliding-scale insulin, moderate coverage ordered  - CBG's in past 24 hours: 131, 200, 291  Dyslipidemia associated with type 2 diabetes mellitus - Continue Lipitor 80 mg at bedtime - Continue daily aspirin   Essential hypertension - Continue Norvasc 10 mg daily, Bidil  TID   DVT prophylaxis: on full dose anticoagulation with coumadin  Code Status: full code  Family Communication: Pt daughter at the bedside this am Disposition Plan: Home once cleared by cardio    Consultants:   Cardiology   Procedures:   None   Antimicrobials:   None    Subjective: No respiratory distress.    Objective: Filed Vitals:   11/13/15 1158 11/13/15 1951 11/13/15 2225 11/14/15 0500  BP: 151/44 146/45  149/65  Pulse: 58 57 56 57  Temp: 98.4 F (36.9 C) 98.7 F (37.1 C)  98.3 F (36.8 C)  TempSrc: Oral Oral  Oral  Resp: 18 18  18   Height:      Weight:    109.408 kg (241 lb 3.2 oz)  SpO2: 95% 95%  98%    Intake/Output Summary (Last 24 hours) at 11/14/15 1044 Last data filed at 11/14/15 0926  Gross per 24 hour  Intake    940 ml  Output   1700 ml  Net   -760 ml   Filed Weights   11/12/15 0606 11/13/15 0424 11/14/15 0500  Weight: 107.548 kg (237 lb 1.6 oz)  108.5 kg (239 lb 3.2 oz) 109.408 kg (241 lb 3.2 oz)    Examination:  General exam: No acute distress  Respiratory system: No wheezing, no rhonchi  Cardiovascular system: S1 & S2 heard, Slightly bradycardic.  Gastrointestinal system: (+) BS, non tender, non distended  Central nervous system: No focal neurological deficits. Extremities:  (+1-2) LE pitting edema bilaterally  Skin: warm, dry Psychiatry: Normal mood and behavior.   Data Reviewed: I have personally reviewed following labs and imaging studies  CBC:  Recent Labs Lab 11/10/15 1647 11/13/15 0217 11/14/15 0331  WBC 9.1 8.1 8.3  NEUTROABS 6.4  --   --   HGB 9.2* 7.7* 7.8*  HCT 31.3* 25.4*  26.0*  MCV 89.9 89.4 91.5  PLT 229 209 [id number]   Basic Metabolic Panel:  Recent Labs Lab 11/10/15 1647 11/11/15 0327 11/12/15 0232 11/13/15 0217 11/14/15 0331  NA 138 137 138 137 138  K 4.8 4.7 5.0 5.3* 5.2*  CL 107 106 103 104 104  CO2 21* 24 25 24 25   GLUCOSE 138* 155* 125* 156* 163*  BUN 56* 53* 66* 77* 82*  CREATININE 2.45* 2.38* 2.45* 2.58* 2.85*  CALCIUM 9.1 8.7* 8.6* 8.3* 8.3*   GFR: Estimated Creatinine Clearance: 22.2 mL/min (by C-G formula based on Cr of 2.85). Liver Function Tests:  Recent Labs Lab 11/10/15 1647  AST 16  ALT 15  ALKPHOS 63  BILITOT 0.4  PROT 6.6  ALBUMIN 3.5   No results for input(s): LIPASE, AMYLASE in the last 168 hours. No results for input(s): AMMONIA in the last 168 hours. Coagulation Profile:  Recent Labs Lab 11/10/15 1852 11/11/15 0327 11/12/15 0232 11/13/15 0217 11/14/15 0331  INR 2.55* 2.83* 2.47* 1.96* 1.83*   Cardiac Enzymes:  Recent Labs Lab 11/12/15 0737 11/12/15 1341 11/12/15 2027 11/13/15 0217 11/13/15 0824  TROPONINI <0.03 <0.03 0.03 <0.03 <0.03   BNP (last 3 results) No results for input(s): PROBNP in the last 8760 hours. HbA1C: No results for input(s): HGBA1C in the last 72 hours. CBG:  Recent Labs Lab 11/13/15 0630 11/13/15 1156 11/13/15 1651 11/13/15 2056 11/14/15 0550  GLUCAP 166* 246* 155* 131* 200*   Lipid Profile: No results for input(s): CHOL, HDL, LDLCALC, TRIG, CHOLHDL, LDLDIRECT in the last 72 hours. Thyroid Function Tests: No results for input(s): TSH, T4TOTAL, FREET4, T3FREE, THYROIDAB in the last 72 hours. Anemia Panel: No results for input(s): VITAMINB12, FOLATE, FERRITIN, TIBC, IRON, RETICCTPCT in the last 72 hours. Urine analysis:    Component Value Date/Time   COLORURINE YELLOW 11/10/2015 1521   APPEARANCEUR CLEAR 11/10/2015 1521   LABSPEC 1.013 11/10/2015 1521   PHURINE 5.5 11/10/2015 1521   GLUCOSEU NEGATIVE 11/10/2015 1521   HGBUR NEGATIVE 11/10/2015 1521    BILIRUBINUR NEGATIVE 11/10/2015 1521   KETONESUR NEGATIVE 11/10/2015 1521   PROTEINUR 100* 11/10/2015 1521   UROBILINOGEN 0.2 03/02/2014 1508   NITRITE NEGATIVE 11/10/2015 1521   LEUKOCYTESUR TRACE* 11/10/2015 1521   Sepsis Labs: @LABRCNTIP (procalcitonin:4,lacticidven:4)   )No results found for this or any previous visit (from the past 240 hour(s)).    Radiology Studies: Dg Chest 2 View 11/10/2015 Mild CHF. Atelectasis or bronchopneumonia at the medial right base. Electronically Signed   By: Monte Fantasia M.D.   On: 11/10/2015 15:12    Scheduled Meds: . amiodarone  200 mg Oral Daily  . amLODipine  10 mg Oral Daily  . aspirin EC  81 mg Oral Daily  . atorvastatin  80 mg Oral Daily  . calcium acetate  1,334 mg Oral TID WC  . furosemide  40 mg Intravenous Daily  . insulin aspart protamine- aspart  15 Units Subcutaneous BID WC  . isosorbide-hydrALAZINE  2 tablet Oral TID  . metoprolol tartrate  25 mg Oral BID  . protein supplement  1 scoop Oral TID WC  . Warfarin - Pharmacist Dosing Inpatient   Does not apply q1800   Continuous Infusions:    LOS: 4 days    Time spent: 15 minutes  Greater than 50% of the time spent on counseling and coordinating the care.   Leisa Lenz, MD [facility] Pager [phone]  If 7PM-7AM, please contact night-coverage [email or web address] Password [facility] 11/14/2015, 10:44 AM

## 2015-11-14 NOTE — Progress Notes (Signed)
Physical Therapy Treatment Patient Details Name: Briana [name] MRN: [id number] DOB: 1945/07/30 Today's Date: 11/14/2015    History of Present Illness Pt is a 70 y/o F who was just admitted last month for multi week (5/6-5/24) hospital stay that started with treatment in the ICU for PNA, followed by diuresis for CHF exacerbation. This was then complicated further by A.Fib RVR which was unstable and required cardioversion. Around that time she also had AKF develop that thankfully would resolve. Also had NSTEMI with trop elevation during that stay that was managed medically, no cath due to kidney function.Pt presents this admission w/ c/o SOB, peripheral edema, and DOE.  Admitting Dx: acute on chronic diastolic CHF.  Pt's PMH includes PVD, depression, anxiety, neuropathy, a-fib.    PT Comments    Ledonna required max encouragement but was agreeable to therapy today.  She ambulated 5 ft while with min assist and pt holding onto the wall for support, declining using the RW and ambulating farther.  She completed therapeutic exercises sitting and standing.  Pt will benefit from continued skilled PT services to increase functional independence and safety.   Follow Up Recommendations  SNF;Supervision for mobility/OOB     Equipment Recommendations  Rolling walker with 5" wheels    Recommendations for Other Services       Precautions / Restrictions Precautions Precautions: Fall Precaution Comments: O2 drops with activity Restrictions Weight Bearing Restrictions: No    Mobility  Bed Mobility               General bed mobility comments: Pt sitting in recliner chair upon PT arrival  Transfers Overall transfer level: Needs assistance Equipment used: None Transfers: Sit to/from Stand Sit to Stand: Min assist         General transfer comment: Pt is unsteady and requires min assist for sit>stand.  Cues for upright stance.  Ambulation/Gait Ambulation/Gait assistance: Min  assist Ambulation Distance (Feet): 5 Feet Assistive device:  (pt holding onto wall) Gait Pattern/deviations: Step-through pattern;Decreased stride length   Gait velocity interpretation: Below normal speed for age/gender General Gait Details: Pt holds onto the wall when ambulating to the [facility].  Min assist to steady when turning, pt declines using RW.     Stairs            Wheelchair Mobility    Modified Rankin (Stroke Patients Only)       Balance Overall balance assessment: Needs assistance Sitting-balance support: No upper extremity supported;Feet supported Sitting balance-Leahy Scale: Good Sitting balance - Comments: Cues for upright posture   Standing balance support: During functional activity;Single extremity supported Standing balance-Leahy Scale: Fair Standing balance comment: Pt able to stand without UE support to perform pericare Ind                    Cognition Arousal/Alertness: Awake/alert Behavior During Therapy: Flat affect Overall Cognitive Status: Within Functional Limits for tasks assessed                      Exercises General Exercises - Upper Extremity Shoulder Flexion: AROM;Both;10 reps;Seated General Exercises - Lower Extremity Ankle Circles/Pumps: AROM;Both;10 reps;Seated Quad Sets: Strengthening;Both;10 reps;Seated Hip Flexion/Marching: Both;Seated;Standing;20 reps;Other (comment) (1 person HHA when performing in standing) Other Exercises Other Exercises: Sitting at edge of chair for 5 minutes w/ cues for upright posture and pursed lip breathing. Other Exercises: Encouraged pt to practice pursed lip breathing technique throughout the day.    General Comments General comments (skin integrity,  edema, etc.): Pt required max encouragement to participate in therapy today and has been upset w/ the news that she might need to receive dialysis.      Pertinent Vitals/Pain Pain Assessment: No/denies pain Pain Intervention(s):  Monitored during session    Home Living                      Prior Function            PT Goals (current goals can now be found in the care plan section) Acute Rehab PT Goals Patient Stated Goal: to feel better PT Goal Formulation: With patient/family Time For Goal Achievement: 11/26/15 Potential to Achieve Goals: Good Progress towards PT goals: Progressing toward goals (modestly)    Frequency  Min 2X/week    PT Plan Current plan remains appropriate    Co-evaluation             End of Session Equipment Utilized During Treatment: Oxygen Activity Tolerance: Patient limited by fatigue Patient left: in chair;with call bell/phone within reach;with family/visitor present     Time: [id number] PT Time Calculation (min) (ACUTE ONLY): 24 min  Charges:  $Therapeutic Exercise: 8-22 mins $Therapeutic Activity: 8-22 mins                    G Codes:      Collie Siad PT, DPT  Pager: [phone] Phone: [phone] 11/14/2015, 1:11 PM

## 2015-11-14 NOTE — Progress Notes (Addendum)
    Subjective:  Denies CP; remains dyspneic   Objective:  Filed Vitals:   11/13/15 1158 11/13/15 1951 11/13/15 2225 11/14/15 0500  BP: 151/44 146/45  149/65  Pulse: 58 57 56 57  Temp: 98.4 F (36.9 C) 98.7 F (37.1 C)  98.3 F (36.8 C)  TempSrc: Oral Oral  Oral  Resp: 18 18  18   Height:      Weight:    241 lb 3.2 oz (109.408 kg)  SpO2: 95% 95%  98%    Intake/Output from previous day:  Intake/Output Summary (Last 24 hours) at 11/14/15 1014 Last data filed at 11/14/15 0926  Gross per 24 hour  Intake    940 ml  Output   1700 ml  Net   -760 ml    Physical Exam: Physical exam: Well-developed well-nourished in no acute distress.  Skin is warm and dry.  HEENT is normal.  Neck is supple. Chest with diminished BS bases Cardiovascular exam is regular rate and rhythm. 2/6 systolic murmur LSB; S2 not diminished. Abdominal exam nontender or distended. No masses palpated. Extremities show 3+ edema. neuro grossly intact    Lab Results: Basic Metabolic Panel:  Recent Labs  11/13/15 0217 11/14/15 0331  NA 137 138  K 5.3* 5.2*  CL 104 104  CO2 24 25  GLUCOSE 156* 163*  BUN 77* 82*  CREATININE 2.58* 2.85*  CALCIUM 8.3* 8.3*   CBC:  Recent Labs  11/13/15 0217 11/14/15 0331  WBC 8.1 8.3  HGB 7.7* 7.8*  HCT 25.4* 26.0*  MCV 89.4 91.5  PLT 209 215   Cardiac Enzymes:  Recent Labs  11/12/15 2027 11/13/15 0217 11/13/15 0824  TROPONINI 0.03 <0.03 <0.03     Assessment/Plan:  1 acute on chronic diastolic congestive heart failure-remains markedly volume overloaded on examination. I/O -1010. Wt 241. Most likely due to combination of diastolic dysfunction and renal insufficiency. Increase Lasix to 80 mg twice a day. 2 Chronic kidney disease stage IV-her renal function will likely worsen with diuresis. Will ask nephrology to follow. She will likely require dialysis in the near future.  3 paroxysmal atrial fibrillation-Status post recent cardioversion in May.  CHADSvasc 6; continue coumadin; continue amiodarone. Remains in sinus. 4 coronary artery disease-no aspirin given need for Coumadin. Continue statin. Previous nuclear study abnormal but we are treating medically given severity of renal insufficiency and risk of contrast nephropathy. 5 hypertension-continue present medications.  Kirk Ruths 11/14/2015, 10:14 AM

## 2015-11-14 NOTE — Progress Notes (Signed)
Inpatient Diabetes Program Recommendations  AACE/ADA: New Consensus Statement on Inpatient Glycemic Control (2015)  Target Ranges:  Prepandial:   less than 140 mg/dL      Peak postprandial:   less than 180 mg/dL (1-2 hours)      Critically ill patients:  140 - 180 mg/dL   Lab Results  Component Value Date   GLUCAP 291* 11/14/2015   HGBA1C 6.9* 09/15/2015    Review of Glycemic Control  Inpatient Diabetes Program Recommendations:  Correction (SSI): add Novolog moderate scale TID  Text page sent to Dr. Charlies Silvers.  Thank you  Raoul Pitch MSN, RN,CDE Inpatient Diabetes Coordinator [phone] (team pager)

## 2015-11-14 NOTE — Clinical Social Work Placement (Signed)
   CLINICAL SOCIAL WORK PLACEMENT  NOTE  Date:  11/14/2015  Patient Details  Name: ADAYLA RADEBAUGH MRN: [id number] Date of Birth: January 28, 1946  Clinical Social Work is seeking post-discharge placement for this patient at the [place] level of care (*CSW will initial, date and re-position this form in  chart as items are completed):  Yes   Patient/family provided with [place] Work Department's list of facilities offering this level of care within the geographic area requested by the patient (or if unable, by the patient's family).  Yes   Patient/family informed of their freedom to choose among providers that offer the needed level of care, that participate in Medicare, Medicaid or managed care program needed by the patient, have an available bed and are willing to accept the patient.  Yes   Patient/family informed of [place]'s ownership interest in [facility] and [facility], as well as of the fact that they are under no obligation to receive care at these facilities.  PASRR submitted to EDS on 11/13/15     PASRR number received on       Existing PASRR number confirmed on 11/13/15     FL2 transmitted to all facilities in geographic area requested by pt/family on 11/13/15     FL2 transmitted to all facilities within larger geographic area on       Patient informed that his/her managed care company has contracts with or will negotiate with certain facilities, including the following:        Yes   Patient/family informed of bed offers received.  Patient [facility] bed at [facility]     Physician recommends and patient chooses bed at      [facility]  Patient to be transferred to facility by       Patient family notified on   of transfer.  Name of family member notified:        PHYSICIAN Please sign FL2     Additional Comment:     _______________________________________________ Candie Chroman, LCSW 11/14/2015, 12:18 PM

## 2015-11-14 NOTE — Progress Notes (Signed)
ANTICOAGULATION CONSULT NOTE - F/U Consult  Pharmacy Consult for warfarin Indication: atrial fibrillation  Allergies  Allergen Reactions  . Carbamazepine Other (See Comments)    Reaction to tegretol - loopy  . Clonazepam Other (See Comments)    Unknown reaction  . Gemfibrozil Other (See Comments)    Patient is not aware of this allergy but states that she has side effects to a cholesterol medication in the past  . Macrobid [Nitrofurantoin] Nausea And Vomiting  . Oxycodone Other (See Comments)    hallucinations    Patient Measurements: Height: 5\' 4"  (162.6 cm) Weight: 241 lb 3.2 oz (109.408 kg) (scale b) IBW/kg (Calculated) : 54.7  Vital Signs: Temp: 98.4 F (36.9 C) (06/20 1221) Temp Source: Oral (06/20 1221) BP: 156/38 mmHg (06/20 1221) Pulse Rate: 54 (06/20 1221)  Labs:  Recent Labs  11/12/15 0232  11/12/15 2027 11/13/15 0217 11/13/15 0824 11/14/15 0331  HGB  --   --   --  7.7*  --  7.8*  HCT  --   --   --  25.4*  --  26.0*  PLT  --   --   --  209  --  215  LABPROT 26.5*  --   --  22.2*  --  21.1*  INR 2.47*  --   --  1.96*  --  1.83*  CREATININE 2.45*  --   --  2.58*  --  2.85*  TROPONINI <0.03  < > 0.03 <0.03 <0.03  --   < > = values in this interval not displayed.  Estimated Creatinine Clearance: 22.2 mL/min (by C-G formula based on Cr of 2.85).  Assessment: 70 y.o. female who presents for evaluation of shortness of breath, orthopnea, and peripheral edema.  PMH: CHF, DM, HTN, CKDIV Recent admit 5/6-5/24 for PNA, and CHF exacerbation.  AC/Heme: warfarin for Hx Afib. INR 1.83  PTA: 5mg  daily except 2.5mg  on Tuesday and Thursday  Nephro: SCr 2.85  Heme: H&H 7.8/26, Plt 215  Goal of Therapy:  INR 2-3 Monitor platelets by anticoagulation protocol: Yes   Plan:  Warfarin 5 mg x 1 Daily INR, CBC q72h  Monitor s/sx of bleeding  Levester Fresh, PharmD, BCPS, [facility] Clinical Pharmacist Pager [phone] 11/14/2015 12:58 PM

## 2015-11-14 NOTE — Clinical Social Work Note (Signed)
Patient notified of acceptance to [facility]. Daughter at bedside. Per MD report during progression, patient will likely discharge in 1-2 days. Facility notified.  Dayton Scrape, [place]

## 2015-11-15 DIAGNOSIS — E785 Hyperlipidemia, unspecified: Secondary | ICD-10-CM

## 2015-11-15 DIAGNOSIS — E1169 Type 2 diabetes mellitus with other specified complication: Secondary | ICD-10-CM

## 2015-11-15 LAB — CBC
HEMATOCRIT: 26.1 % — AB (ref 36.0–46.0)
Hemoglobin: 7.9 g/dL — ABNORMAL LOW (ref 12.0–15.0)
MCH: 27.1 pg (ref 26.0–34.0)
MCHC: 30.3 g/dL (ref 30.0–36.0)
MCV: 89.7 fL (ref 78.0–100.0)
Platelets: 228 10*3/uL (ref 150–400)
RBC: 2.91 MIL/uL — ABNORMAL LOW (ref 3.87–5.11)
RDW: 16.5 % — AB (ref 11.5–15.5)
WBC: 8 10*3/uL (ref 4.0–10.5)

## 2015-11-15 LAB — BASIC METABOLIC PANEL
ANION GAP: 7 (ref 5–15)
BUN: 88 mg/dL — ABNORMAL HIGH (ref 6–20)
CALCIUM: 8.5 mg/dL — AB (ref 8.9–10.3)
CO2: 28 mmol/L (ref 22–32)
Chloride: 104 mmol/L (ref 101–111)
Creatinine, Ser: 3.03 mg/dL — ABNORMAL HIGH (ref 0.44–1.00)
GFR, EST AFRICAN AMERICAN: 17 mL/min — AB (ref >60)
GFR, EST NON AFRICAN AMERICAN: 15 mL/min — AB (ref >60)
Glucose, Bld: 123 mg/dL — ABNORMAL HIGH (ref 65–99)
Potassium: 5.2 mmol/L — ABNORMAL HIGH (ref 3.5–5.1)
Sodium: 139 mmol/L (ref 135–145)

## 2015-11-15 LAB — GLUCOSE, CAPILLARY
GLUCOSE-CAPILLARY: 126 mg/dL — AB (ref 65–99)
GLUCOSE-CAPILLARY: 241 mg/dL — AB (ref 65–99)
GLUCOSE-CAPILLARY: 189 mg/dL — AB (ref 65–99)
Glucose-Capillary: 226 mg/dL — ABNORMAL HIGH (ref 65–99)

## 2015-11-15 LAB — PROTIME-INR
INR: 2.18 — ABNORMAL HIGH (ref 0.00–1.49)
Prothrombin Time: 24.1 seconds — ABNORMAL HIGH (ref 11.6–15.2)

## 2015-11-15 MED ORDER — WARFARIN SODIUM 5 MG PO TABS
5.0000 mg | ORAL_TABLET | Freq: Once | ORAL | Status: AC
  Administered 2015-11-15: 5 mg via ORAL
  Filled 2015-11-15: qty 1

## 2015-11-15 MED ORDER — FUROSEMIDE 10 MG/ML IJ SOLN
120.0000 mg | Freq: Two times a day (BID) | INTRAVENOUS | Status: DC
  Administered 2015-11-15 – 2015-11-16 (×2): 120 mg via INTRAVENOUS
  Filled 2015-11-15 (×4): qty 12

## 2015-11-15 NOTE — Progress Notes (Signed)
Patient Name: Briana [name] Date of Encounter: 11/15/2015  Hospital Problem List     Principal Problem:   Acute on chronic diastolic (congestive) heart failure (HCC) Active Problems:   Acute on chronic diastolic CHF (congestive heart failure) (HCC)   Controlled diabetes mellitus type 2 with complications (HCC)   CAD in native artery   Chronic kidney disease (CKD), stage IV (severe) (HCC)   Essential hypertension   Anemia of chronic kidney failure    Subjective   No chest pain, but remains dyspneic with minimal exertion.   Inpatient Medications    . amiodarone  200 mg Oral Daily  . amLODipine  10 mg Oral Daily  . atorvastatin  80 mg Oral Daily  . bisacodyl  10 mg Rectal Once  . calcium acetate  1,334 mg Oral TID WC  . docusate sodium  100 mg Oral BID  . furosemide  80 mg Intravenous BID  . insulin aspart  0-15 Units Subcutaneous TID WC  . insulin aspart  0-5 Units Subcutaneous QHS  . insulin aspart protamine- aspart  15 Units Subcutaneous BID WC  . isosorbide-hydrALAZINE  2 tablet Oral TID  . metoprolol tartrate  25 mg Oral BID  . polyethylene glycol  17 g Oral BID  . protein supplement  1 scoop Oral TID WC  . sodium chloride flush  3 mL Intravenous Q12H  . Warfarin - Pharmacist Dosing Inpatient   Does not apply q1800    Vital Signs    Filed Vitals:   11/14/15 1221 11/14/15 2113 11/14/15 2155 11/15/15 0434  BP: 156/38 155/44  150/40  Pulse: 54 54 55 55  Temp: 98.4 F (36.9 C) 98.8 F (37.1 C)  98.5 F (36.9 C)  TempSrc: Oral Oral  Oral  Resp: 20 20  20   Height:      Weight:    241 lb 11.2 oz (109.634 kg)  SpO2: 95% 98%  97%    Intake/Output Summary (Last 24 hours) at 11/15/15 0921 Last data filed at 11/15/15 0911  Gross per 24 hour  Intake   1320 ml  Output   1550 ml  Net   -230 ml   Filed Weights   11/13/15 0424 11/14/15 0500 11/15/15 0434  Weight: 239 lb 3.2 oz (108.5 kg) 241 lb 3.2 oz (109.408 kg) 241 lb 11.2 oz (109.634 kg)    Physical  Exam    General: Pleasant older remale, NAD, using [place].  Neuro: Alert and oriented X 3. Moves all extremities spontaneously. Psych: Normal affect. HEENT:  Normal  Neck: Supple without bruits or JVD. Lungs:  Resp regular and unlabored, Diminished bilaterally. Heart: RRR no s3, s4, 2/6 systolic murmur LSB. Abdomen: Soft, non-tender, non-distended, BS + x 4.  Extremities: No clubbing, cyanosis 3+ bilateral LE edema. DP/PT/Radials 2+ and equal bilaterally.  Labs    CBC  Recent Labs  11/14/15 0331 11/15/15 0414  WBC 8.3 8.0  HGB 7.8* 7.9*  HCT 26.0* 26.1*  MCV 91.5 89.7  PLT 215 [id number]   Basic Metabolic Panel  Recent Labs  11/14/15 0331 11/15/15 0414  NA 138 139  K 5.2* 5.2*  CL 104 104  CO2 25 28  GLUCOSE 163* 123*  BUN 82* 88*  CREATININE 2.85* 3.03*  CALCIUM 8.3* 8.5*   Cardiac Enzymes  Recent Labs  11/12/15 2027 11/13/15 0217 11/13/15 0824  TROPONINI 0.03 <0.03 <0.03    Telemetry    SB   ECG    No recent  Radiology  Assessment & Plan    70 yo female with PMH of DM II/HTN/HLD/PVD/CKD IV/A-fib (on coumadin)/CAD s/p CABG 2005 and chronic Diastolic CHF who presented to the [facility] [place] with reports of worsening DOE over the past 2 weeks  1. Acute on chronic diastolic congestive heart failure: remains markedly volume overloaded on examination. UOP yesterday 1.5L Weight 241lbs. Most likely due to combination of diastolic dysfunction and renal insufficiency. Increase Lasix to 80 mg twice a day yesterday. Cr continues to increase with diuresis 2.58>>2.85>>3.03. Nephrology consulted for assistance.  2. Chronic kidney disease stage IV: her renal function will likely worsen with diuresis. Will ask nephrology to follow. She will likely require dialysis in the near future.   3. Paroxysmal atrial fibrillation: Status post recent cardioversion in May. CHADSvasc 6 --continue coumadin (INR 2.18) and amiodarone --remains in SR on telemetry  4. Coronary artery  disease: no aspirin given need for Coumadin. Previous nuclear study abnormal but we are treating medically given severity of renal insufficiency and risk of contrast nephropathy. --continue statin, BB, BiDil  5. Hypertension: continue present medications  Signed, Reino Bellis NP-C Pager [phone] As above; patient seen and examined; no chest pain; dyspnea mildly improved; remains volume overloaded on exam; increase lasix to 120 mg BID; follow renal function; nephrology consult. Kirk Ruths

## 2015-11-15 NOTE — Consult Note (Signed)
I was asked by Dr.Devine to see Briana [name] who is a 70 y.o. 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 Birdie Sons recently hospitalized in May with CHF, NSTEMI, Afib with RVR requiring DCCV. She was taking furosemide 15m daily at discharge increasing to furosemide BID a few days PTA.  She was readmitted on June 16 with recurrent SOB and evidence of CHF. Creat on 6/5 was 2.42.  On admission on 6/16 creat was 2.45 rising to 3.30 today. Pt is minus 5.6 liters since admission, currently receiving 2449mfurosemide IV daily..   Past Medical History  Diagnosis Date  . Diabetes mellitus   . Hypertension   . Hyperlipidemia   . Heart murmur   . Peripheral vascular disease ([facility]  . Arthritis   . Depression with anxiety   . Carotid artery occlusion   . Chronic kidney disease   . Neuropathy ([facility]  . CAD (coronary artery disease)   . Rheumatic fever   . Sleep apnea   . PAD (peripheral artery disease) ([facility]  . Adenomatous colon polyp 09/2007  . Atrial fibrillation with rapid ventricular response ([place]5/03/2016   Past Surgical History  Procedure Laterality Date  . Coronary artery bypass graft  2005  . Carotid endarterectomy  10/04/2009    Right CEA  . Tubal ligation    . Polypectomy  02/25/2012    Procedure: POLYPECTOMY;  Surgeon: SaDanie BinderMD;  Location: [facility];  Service: Endoscopy;  Laterality: N/A;   Social History:  reports that she has never smoked. She has never used smokeless tobacco. She reports that she does not drink alcohol or use illicit drugs. Allergies:  Allergies  Allergen Reactions  . Carbamazepine Other (See Comments)    Reaction to tegretol - loopy  . Clonazepam Other (See Comments)    Unknown reaction  . Gemfibrozil Other (See Comments)    Patient is not aware of this allergy but states that she has side effects to a cholesterol medication in the past  . Macrobid [Nitrofurantoin] Nausea And Vomiting  . Oxycodone Other (See Comments)    hallucinations    Family History  Problem Relation Age of Onset  . Breast cancer Mother   . Heart disease Father   . Diabetes Father   . Heart disease Brother     Medications:  Prior to Admission:  Prescriptions prior to admission  Medication Sig Dispense Refill Last Dose  . amiodarone (PACERONE) 200 MG tablet Take 1 tablet (200 mg total) by mouth daily. 30 tablet 1 11/10/2015 at Unknown time  . amLODipine (NORVASC) 10 MG tablet Take 1 tablet (10 mg total) by mouth daily. 30 tablet 0 11/10/2015 at Unknown time  . aspirin EC 81 MG EC tablet Take 1 tablet (81 mg total) by mouth daily. 30 tablet 1 11/10/2015 at Unknown time  . atorvastatin (LIPITOR) 80 MG tablet Take 1 tablet (80 mg total) by mouth daily. 30 tablet 0 11/10/2015 at Unknown time  . calcium acetate (PHOSLO) 667 MG capsule Take 2 capsules (1,334 mg total) by mouth 3 (three) times daily with meals. 180 capsule 1 11/10/2015 at Unknown time  . cetirizine (ZYRTEC) 10 MG tablet Take 10 mg by mouth at bedtime.   11/09/2015 at Unknown time  . Cholecalciferol (VITAMIN D) 2000 units tablet Take 2,000 Units by mouth daily.   11/10/2015 at Unknown time  . furosemide (LASIX) 40 MG tablet Take 1 tablet (40 mg total) by mouth daily. (Patient taking differently:  Take 40 mg by mouth 2 (two) times daily. ) 30 tablet 0 11/10/2015 at Unknown time  . HYDROcodone-acetaminophen (NORCO) 7.5-325 MG tablet Take 1 tablet by mouth every 4 (four) hours as needed for moderate pain (Must last 30 days.  Do not drive or operate machinery while taking this medicine.). 120 tablet 0 Past Week at Unknown time  . insulin NPH-regular Human (NOVOLIN 70/30) (70-30) 100 UNIT/ML injection Inject 15 Units into the skin 2 (two) times daily with a meal. 50 mL 0 11/10/2015 at Unknown time  . isosorbide-hydrALAZINE (BIDIL) 20-37.5 MG tablet Take 2 tablets by mouth 3 (three) times daily. 180 tablet 0 11/10/2015 at Unknown time  . metoprolol tartrate (LOPRESSOR) 25 MG tablet Take 1 tablet (25 mg total) by  mouth 2 (two) times daily. 30 tablet 1 11/10/2015 at 0800  . nitroGLYCERIN (NITROSTAT) 0.4 MG SL tablet Place 1 tablet (0.4 mg total) under the tongue every 5 (five) minutes as needed for chest pain. Max three pills 45 tablet 0 unk at unk  . potassium chloride SA (K-DUR,KLOR-CON) 20 MEQ tablet Take 20 mEq by mouth daily.   11/09/2015 at Unknown time  . protein supplement (RESOURCE BENEPROTEIN) POWD Take 6 g by mouth 3 (three) times daily with meals.  0 Past Week at Unknown time  . warfarin (COUMADIN) 5 MG tablet Take one pill on Mon, Wed, Fri, Sat, Sun. Take 1/2 pill on Tue and Thus (Patient taking differently: 2.5-5 mg See admin instructions. Take one pill on Mon, Wed, Fri, Sat, Sun. Take 1/2 pill on Tue and Thus) 30 tablet 0 11/09/2015 at 1800   Scheduled: . amiodarone  200 mg Oral Daily  . amLODipine  10 mg Oral Daily  . atorvastatin  80 mg Oral Daily  . bisacodyl  10 mg Rectal Once  . calcium acetate  1,334 mg Oral TID WC  . docusate sodium  100 mg Oral BID  . furosemide  120 mg Intravenous BID  . insulin aspart  0-15 Units Subcutaneous TID WC  . insulin aspart  0-5 Units Subcutaneous QHS  . insulin aspart protamine- aspart  15 Units Subcutaneous BID WC  . isosorbide-hydrALAZINE  2 tablet Oral TID  . metoprolol tartrate  25 mg Oral BID  . polyethylene glycol  17 g Oral BID  . protein supplement  1 scoop Oral TID WC  . sodium chloride flush  3 mL Intravenous Q12H  . warfarin  5 mg Oral ONCE-1800  . Warfarin - Pharmacist Dosing Inpatient   Does not apply q1800   ROS: as per HPI Blood pressure 138/53, pulse 56, temperature 98.6 F (37 C), temperature source Oral, resp. rate 18, height '5\' 4"'  (1.626 m), weight 109.634 kg (241 lb 11.2 oz), SpO2 94 %.  General appearance: alert and cooperative Head: Normocephalic, without obvious abnormality, atraumatic Eyes: negative Ears: normal TM's and external ear canals both ears Nose: no discharge Throat: lips, mucosa, and tongue normal; teeth  and gums normal Resp: diminished breath sounds bibasilar Chest wall: no tenderness Cardio: regular rate and rhythm, S1, S2 normal, no murmur, click, rub or gallop GI: tender upper abd bilat Extremities: chronics changes in legs bilat Skin: Skin color, texture, turgor normal. No rashes or lesions Neurologic: Grossly normal Results for orders placed or performed during the hospital encounter of 11/10/15 (from the past 48 hour(s))  Glucose, capillary     Status: Abnormal   Collection Time: 11/13/15  4:51 PM  Result Value Ref Range   Glucose-Capillary 155 (H)  65 - 99 mg/dL   Comment 1 Notify RN   Glucose, capillary     Status: Abnormal   Collection Time: 11/13/15  8:56 PM  Result Value Ref Range   Glucose-Capillary 131 (H) 65 - 99 mg/dL  Protime-INR     Status: Abnormal   Collection Time: 11/14/15  3:31 AM  Result Value Ref Range   Prothrombin Time 21.1 (H) 11.6 - 15.2 seconds   INR 1.83 (H) 0.00 - 1.49  CBC     Status: Abnormal   Collection Time: 11/14/15  3:31 AM  Result Value Ref Range   WBC 8.3 4.0 - 10.5 K/uL   RBC 2.84 (L) 3.87 - 5.11 MIL/uL   Hemoglobin 7.8 (L) 12.0 - 15.0 g/dL   HCT 26.0 (L) 36.0 - 46.0 %   MCV 91.5 78.0 - 100.0 fL   MCH 27.5 26.0 - 34.0 pg   MCHC 30.0 30.0 - 36.0 g/dL   RDW 16.6 (H) 11.5 - 15.5 %   Platelets 215 150 - 400 K/uL  Basic metabolic panel     Status: Abnormal   Collection Time: 11/14/15  3:31 AM  Result Value Ref Range   Sodium 138 135 - 145 mmol/L   Potassium 5.2 (H) 3.5 - 5.1 mmol/L   Chloride 104 101 - 111 mmol/L   CO2 25 22 - 32 mmol/L   Glucose, Bld 163 (H) 65 - 99 mg/dL   BUN 82 (H) 6 - 20 mg/dL   Creatinine, Ser 2.85 (H) 0.44 - 1.00 mg/dL   Calcium 8.3 (L) 8.9 - 10.3 mg/dL   GFR calc non Af Amer 16 (L) >60 mL/min   GFR calc Af Amer 18 (L) >60 mL/min    Comment: (NOTE) The eGFR has been calculated using the CKD EPI equation. This calculation has not been validated in all clinical situations. eGFR's persistently <60 mL/min  signify possible Chronic Kidney Disease.    Anion gap 9 5 - 15  Glucose, capillary     Status: Abnormal   Collection Time: 11/14/15  5:50 AM  Result Value Ref Range   Glucose-Capillary 200 (H) 65 - 99 mg/dL  Glucose, capillary     Status: Abnormal   Collection Time: 11/14/15 11:19 AM  Result Value Ref Range   Glucose-Capillary 291 (H) 65 - 99 mg/dL  Glucose, capillary     Status: Abnormal   Collection Time: 11/14/15  4:42 PM  Result Value Ref Range   Glucose-Capillary 220 (H) 65 - 99 mg/dL   Comment 1 Notify RN   Glucose, capillary     Status: Abnormal   Collection Time: 11/14/15  9:12 PM  Result Value Ref Range   Glucose-Capillary 129 (H) 65 - 99 mg/dL   Comment 1 Notify RN    Comment 2 Document in Chart   Protime-INR     Status: Abnormal   Collection Time: 11/15/15  4:14 AM  Result Value Ref Range   Prothrombin Time 24.1 (H) 11.6 - 15.2 seconds   INR 2.18 (H) 0.00 - 1.49  CBC     Status: Abnormal   Collection Time: 11/15/15  4:14 AM  Result Value Ref Range   WBC 8.0 4.0 - 10.5 K/uL   RBC 2.91 (L) 3.87 - 5.11 MIL/uL   Hemoglobin 7.9 (L) 12.0 - 15.0 g/dL   HCT 26.1 (L) 36.0 - 46.0 %   MCV 89.7 78.0 - 100.0 fL   MCH 27.1 26.0 - 34.0 pg   MCHC 30.3 30.0 - 36.0  g/dL   RDW 16.5 (H) 11.5 - 15.5 %   Platelets 228 150 - 400 K/uL  Basic metabolic panel     Status: Abnormal   Collection Time: 11/15/15  4:14 AM  Result Value Ref Range   Sodium 139 135 - 145 mmol/L   Potassium 5.2 (H) 3.5 - 5.1 mmol/L   Chloride 104 101 - 111 mmol/L   CO2 28 22 - 32 mmol/L   Glucose, Bld 123 (H) 65 - 99 mg/dL   BUN 88 (H) 6 - 20 mg/dL   Creatinine, Ser 3.03 (H) 0.44 - 1.00 mg/dL   Calcium 8.5 (L) 8.9 - 10.3 mg/dL   GFR calc non Af Amer 15 (L) >60 mL/min   GFR calc Af Amer 17 (L) >60 mL/min    Comment: (NOTE) The eGFR has been calculated using the CKD EPI equation. This calculation has not been validated in all clinical situations. eGFR's persistently <60 mL/min signify possible Chronic  Kidney Disease.    Anion gap 7 5 - 15  Glucose, capillary     Status: Abnormal   Collection Time: 11/15/15  5:59 AM  Result Value Ref Range   Glucose-Capillary 126 (H) 65 - 99 mg/dL  Glucose, capillary     Status: Abnormal   Collection Time: 11/15/15 11:28 AM  Result Value Ref Range   Glucose-Capillary 241 (H) 65 - 99 mg/dL   Comment 1 Notify RN    No results found.  Assessment:  1 CKD 3/4 2 Volume overload/CHF 3 AKI, hemodynamically mediated Plan: 1 Continue diuresis to optimize CO and RBF  Hicks Feick C 11/15/2015, 1:29 PM

## 2015-11-15 NOTE — Progress Notes (Signed)
ANTICOAGULATION CONSULT NOTE - F/U Consult  Pharmacy Consult for warfarin Indication: atrial fibrillation  Allergies  Allergen Reactions  . Carbamazepine Other (See Comments)    Reaction to tegretol - loopy  . Clonazepam Other (See Comments)    Unknown reaction  . Gemfibrozil Other (See Comments)    Patient is not aware of this allergy but states that she has side effects to a cholesterol medication in the past  . Macrobid [Nitrofurantoin] Nausea And Vomiting  . Oxycodone Other (See Comments)    hallucinations    Patient Measurements: Height: 5\' 4"  (162.6 cm) Weight: 241 lb 11.2 oz (109.634 kg) IBW/kg (Calculated) : 54.7  Vital Signs: Temp: 98.6 F (37 C) (06/21 1126) Temp Source: Oral (06/21 1126) BP: 138/53 mmHg (06/21 1126) Pulse Rate: 56 (06/21 1126)  Labs:  Recent Labs  11/12/15 2027  11/13/15 0217 11/13/15 0824 11/14/15 0331 11/15/15 0414  HGB  --   < > 7.7*  --  7.8* 7.9*  HCT  --   --  25.4*  --  26.0* 26.1*  PLT  --   --  209  --  215 228  LABPROT  --   --  22.2*  --  21.1* 24.1*  INR  --   --  1.96*  --  1.83* 2.18*  CREATININE  --   --  2.58*  --  2.85* 3.03*  TROPONINI 0.03  --  <0.03 <0.03  --   --   < > = values in this interval not displayed.  Estimated Creatinine Clearance: 20.9 mL/min (by C-G formula based on Cr of 3.03).  Assessment: 70 y.o. female who presents for evaluation of shortness of breath, orthopnea, and peripheral edema.  PMH: CHF, DM, HTN, CKDIV Recent admit 5/6-5/24 for PNA, and CHF exacerbation.  AC/Heme: warfarin for Hx Afib. INR 2.18  PTA: 5mg  daily except 2.5mg  on Tuesday and Thursday  Nephro: SCr 2.85  Heme: H&H 7.9/26.1, Plt 228  Goal of Therapy:  INR 2-3 Monitor platelets by anticoagulation protocol: Yes   Plan:  Warfarin 5 mg x 1 Daily INR, CBC q72h  Monitor s/sx of bleeding  Levester Fresh, PharmD, BCPS, [facility] Clinical Pharmacist Pager [phone] 11/15/2015 12:50 PM

## 2015-11-15 NOTE — Progress Notes (Addendum)
Patient ID: Briana [name], female   DOB: 1946/05/27, 70 y.o.   MRN: [id number]  PROGRESS NOTE    Briana [name]  [id number] DOB: 1945/10/12 DOA: 11/10/2015  PCP: [name]   Brief Narrative:  70 y.o. female with past medical history significant for chronic diastolic CHF (in [id number] TEE with EF 55%), hypertension, dyslipidemia, atrial fibrillation on anticoagulation with Coumadin, recently hospitalized 5/6-5/24 of 2017 for pneumonia which was then complicated further by A.Fib RVR which was unstable and required cardioversion. She also had NSTEMI which was managed medically and no cardiac cath due to acute kidney failure. She was discharged to inpatient rehab.   Patient presented to [facility] with worsening shortness of breath over past 2 weeks prior to this admission, or extremity edema. She was seen by primary care physician the week prior to the admission and was instructed to take Lasix twice daily whereas before she was on 40 mg once a day Lasix. There was no significant symptomatic relief for which reason she presented to ED for further evaluation.   Hospital course complicated with worsening renal function. Patient is volume overloaded, on IV Lasix. Nephrology will see the pt in consultation.   Assessment & Plan:   Acute on chronic diastolic CHF  - Has had TEE for cardioversion in 09/2015 which showed EF 55% - BNP on this admission 668  - Pt is still quite volume overloaded, barely 470 cc negative fluid balance 6/20 - Weight in past 72 hours: 107.54 kg --> 108.5 kg --> 109.4 kg --> 109.6 kg  - She is on lasix to 80 mg IV daily - Continue daily weight and strict intake and output  - Appreciate cardiology following   CKD stage 4 / Hyperkalemia  - Recent creatinine 3 weeks ago was 2.75 - Creatinine 2.38 on this admission and further trending up, 3.03 this morning - Probably related to Lasix - Appreciate very much nephrology consult and recommendations -  Continue phoslo  - Check BMP in am  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 - Continue normal lobe 70/30 mix 15 units twice daily - Sliding-scale insulin, moderate coverage ordered  - CBG's in past 24 hours: 220, 129, 126  Dyslipidemia associated with type 2 diabetes mellitus - Continue Lipitor 80 mg at bedtime - Continue daily aspirin   Essential hypertension - Continue Norvasc 10 mg daily, Bidil TID, metoprolol 25 mg PO BID   DVT prophylaxis: on full dose anticoagulation with coumadin  Code Status: full code  Family Communication: family not at the bedside this am Disposition Plan: Home once cleared by cardio    Consultants:   Cardiology   Procedures:   None   Antimicrobials:   None    Subjective: No overnight events.   Objective: Filed Vitals:   11/14/15 1221 11/14/15 2113 11/14/15 2155 11/15/15 0434  BP: 156/38 155/44  150/40  Pulse: 54 54 55 55  Temp: 98.4 F (36.9 C) 98.8 F (37.1 C)  98.5 F (36.9 C)  TempSrc: Oral Oral  Oral  Resp: 20 20  20   Height:      Weight:    109.634 kg (  241 lb 11.2 oz)  SpO2: 95% 98%  97%    Intake/Output Summary (Last 24 hours) at 11/15/15 1027 Last data filed at 11/15/15 0911  Gross per 24 hour  Intake   1320 ml  Output   1150 ml  Net    170 ml   Filed Weights   11/13/15 0424 11/14/15 0500 11/15/15 0434  Weight: 108.5 kg (239 lb 3.2 oz) 109.408 kg (241 lb 3.2 oz) 109.634 kg (241 lb 11.2 oz)    Examination:  General exam: calm and comfortable  Respiratory system: Bilateral air entry, no wheezing  Cardiovascular system: S1 & S2 (+), bradycardia  Gastrointestinal system:  (+) BS, no tenderness  Central nervous system: Nonfocal . Extremities:  +3 LE pitting edema, no redness  Skin: no lesions or ulcers  Psychiatry: No restlessness or agitation   Data Reviewed: I have personally reviewed following labs and imaging studies  CBC:  Recent Labs Lab 11/10/15 1647 11/13/15 0217 11/14/15 0331 11/15/15 0414  WBC 9.1 8.1 8.3 8.0  NEUTROABS 6.4  --   --   --   HGB 9.2* 7.7* 7.8* 7.9*  HCT 31.3* 25.4* 26.0* 26.1*  MCV 89.9 89.4 91.5 89.7  PLT 229 209 215 [id number]   Basic Metabolic Panel:  Recent Labs Lab 11/11/15 0327 11/12/15 0232 11/13/15 0217 11/14/15 0331 11/15/15 0414  NA 137 138 137 138 139  K 4.7 5.0 5.3* 5.2* 5.2*  CL 106 103 104 104 104  CO2 24 25 24 25 28   GLUCOSE 155* 125* 156* 163* 123*  BUN 53* 66* 77* 82* 88*  CREATININE 2.38* 2.45* 2.58* 2.85* 3.03*  CALCIUM 8.7* 8.6* 8.3* 8.3* 8.5*   GFR: Estimated Creatinine Clearance: 20.9 mL/min (by C-G formula based on Cr of 3.03). Liver Function Tests:  Recent Labs Lab 11/10/15 1647  AST 16  ALT 15  ALKPHOS 63  BILITOT 0.4  PROT 6.6  ALBUMIN 3.5   No results for input(s): LIPASE, AMYLASE in the last 168 hours. No results for input(s): AMMONIA in the last 168 hours. Coagulation Profile:  Recent Labs Lab 11/11/15 0327 11/12/15 0232 11/13/15 0217 11/14/15 0331 11/15/15 0414  INR 2.83* 2.47* 1.96* 1.83* 2.18*   Cardiac Enzymes:  Recent Labs Lab 11/12/15 0737 11/12/15 1341 11/12/15 2027 11/13/15 0217 11/13/15 0824  TROPONINI <0.03 <0.03 0.03 <0.03 <0.03   BNP (last 3 results) No results for input(s): PROBNP in the last 8760 hours. HbA1C: No results for input(s): HGBA1C in the last 72 hours. CBG:  Recent Labs Lab 11/14/15 0550 11/14/15 1119 11/14/15 1642 11/14/15 2112 11/15/15 0559  GLUCAP 200* 291* 220* 129* 126*   Lipid Profile: No results for input(s): CHOL, HDL, LDLCALC, TRIG, CHOLHDL, LDLDIRECT in the last 72 hours. Thyroid Function Tests: No results  for input(s): TSH, T4TOTAL, FREET4, T3FREE, THYROIDAB in the last 72 hours. Anemia Panel: No results for input(s): VITAMINB12, FOLATE, FERRITIN, TIBC, IRON, RETICCTPCT in the last 72 hours. Urine analysis:    Component Value Date/Time   COLORURINE YELLOW 11/10/2015 1521   APPEARANCEUR CLEAR 11/10/2015 1521   LABSPEC 1.013 11/10/2015 1521   PHURINE 5.5 11/10/2015 1521   GLUCOSEU NEGATIVE 11/10/2015 1521   HGBUR NEGATIVE 11/10/2015 1521   BILIRUBINUR NEGATIVE 11/10/2015 1521   KETONESUR NEGATIVE 11/10/2015 1521   PROTEINUR 100* 11/10/2015 1521   UROBILINOGEN 0.2 03/02/2014 1508   NITRITE NEGATIVE 11/10/2015 1521   LEUKOCYTESUR TRACE* 11/10/2015 1521   Sepsis Labs: @LABRCNTIP (procalcitonin:4,lacticidven:4)   )No results found for this or any previous visit (  from the past 240 hour(s)).    Radiology Studies: Dg Chest 2 View 11/10/2015 Mild CHF. Atelectasis or bronchopneumonia at the medial right base. Electronically Signed   By: Monte Fantasia M.D.   On: 11/10/2015 15:12    Scheduled Meds: . amiodarone  200 mg Oral Daily  . amLODipine  10 mg Oral Daily  . aspirin EC  81 mg Oral Daily  . atorvastatin  80 mg Oral Daily  . calcium acetate  1,334 mg Oral TID WC  . furosemide  40 mg Intravenous Daily  . insulin aspart protamine- aspart  15 Units Subcutaneous BID WC  . isosorbide-hydrALAZINE  2 tablet Oral TID  . metoprolol tartrate  25 mg Oral BID  . protein supplement  1 scoop Oral TID WC  . Warfarin - Pharmacist Dosing Inpatient   Does not apply q1800   Continuous Infusions:    LOS: 5 days    Time spent: 25 minutes  Greater than 50% of the time spent on counseling and coordinating the care.   Leisa Lenz, MD [facility] Pager [phone]  If 7PM-7AM, please contact night-coverage [email or web address] Password [facility] 11/15/2015, 10:27 AM

## 2015-11-16 ENCOUNTER — Ambulatory Visit: Payer: Medicare Other | Admitting: Orthopaedic Surgery

## 2015-11-16 ENCOUNTER — Inpatient Hospital Stay (HOSPITAL_COMMUNITY): Payer: Medicare Other

## 2015-11-16 DIAGNOSIS — N189 Chronic kidney disease, unspecified: Secondary | ICD-10-CM

## 2015-11-16 LAB — GLUCOSE, CAPILLARY
GLUCOSE-CAPILLARY: 243 mg/dL — AB (ref 65–99)
Glucose-Capillary: 157 mg/dL — ABNORMAL HIGH (ref 65–99)
Glucose-Capillary: 169 mg/dL — ABNORMAL HIGH (ref 65–99)
Glucose-Capillary: 281 mg/dL — ABNORMAL HIGH (ref 65–99)

## 2015-11-16 LAB — CBC
HCT: 26.1 % — ABNORMAL LOW (ref 36.0–46.0)
HEMOGLOBIN: 7.8 g/dL — AB (ref 12.0–15.0)
MCH: 26.6 pg (ref 26.0–34.0)
MCHC: 29.9 g/dL — AB (ref 30.0–36.0)
MCV: 89.1 fL (ref 78.0–100.0)
Platelets: 215 10*3/uL (ref 150–400)
RBC: 2.93 MIL/uL — AB (ref 3.87–5.11)
RDW: 16.4 % — ABNORMAL HIGH (ref 11.5–15.5)
WBC: 8.6 10*3/uL (ref 4.0–10.5)

## 2015-11-16 LAB — BASIC METABOLIC PANEL
Anion gap: 11 (ref 5–15)
BUN: 94 mg/dL — AB (ref 6–20)
CO2: 24 mmol/L (ref 22–32)
CREATININE: 2.9 mg/dL — AB (ref 0.44–1.00)
Calcium: 8.3 mg/dL — ABNORMAL LOW (ref 8.9–10.3)
Chloride: 99 mmol/L — ABNORMAL LOW (ref 101–111)
GFR calc Af Amer: 18 mL/min — ABNORMAL LOW (ref >60)
GFR, EST NON AFRICAN AMERICAN: 15 mL/min — AB (ref >60)
GLUCOSE: 226 mg/dL — AB (ref 65–99)
Potassium: 5 mmol/L (ref 3.5–5.1)
Sodium: 134 mmol/L — ABNORMAL LOW (ref 135–145)

## 2015-11-16 LAB — PROTIME-INR
INR: 2.27 — ABNORMAL HIGH (ref 0.00–1.49)
PROTHROMBIN TIME: 24.9 s — AB (ref 11.6–15.2)

## 2015-11-16 MED ORDER — FUROSEMIDE 10 MG/ML IJ SOLN
160.0000 mg | Freq: Four times a day (QID) | INTRAMUSCULAR | Status: DC
  Administered 2015-11-16 – 2015-11-18 (×7): 160 mg via INTRAVENOUS
  Filled 2015-11-16 (×10): qty 16

## 2015-11-16 MED ORDER — METOLAZONE 2.5 MG PO TABS
2.5000 mg | ORAL_TABLET | Freq: Every day | ORAL | Status: DC
  Administered 2015-11-16 – 2015-11-20 (×5): 2.5 mg via ORAL
  Filled 2015-11-16 (×6): qty 1

## 2015-11-16 MED ORDER — WARFARIN SODIUM 2.5 MG PO TABS
2.5000 mg | ORAL_TABLET | Freq: Once | ORAL | Status: AC
  Administered 2015-11-16: 2.5 mg via ORAL
  Filled 2015-11-16: qty 1

## 2015-11-16 NOTE — Progress Notes (Signed)
ANTICOAGULATION CONSULT NOTE - F/U Consult  Pharmacy Consult for warfarin Indication: atrial fibrillation  Allergies  Allergen Reactions  . Carbamazepine Other (See Comments)    Reaction to tegretol - loopy  . Clonazepam Other (See Comments)    Unknown reaction  . Gemfibrozil Other (See Comments)    Patient is not aware of this allergy but states that she has side effects to a cholesterol medication in the past  . Macrobid [Nitrofurantoin] Nausea And Vomiting  . Oxycodone Other (See Comments)    hallucinations    Patient Measurements: Height: 5\' 4"  (162.6 cm) Weight: 243 lb 6.4 oz (110.406 kg) (scale b) IBW/kg (Calculated) : 54.7  Vital Signs: Temp: 98.4 F (36.9 C) (06/22 1100) Temp Source: Oral (06/22 1100) BP: 129/49 mmHg (06/22 1100) Pulse Rate: 65 (06/22 1100)  Labs:  Recent Labs  11/14/15 0331 11/15/15 0414 11/16/15 0312 11/16/15 1108  HGB 7.8* 7.9*  --  7.8*  HCT 26.0* 26.1*  --  26.1*  PLT 215 228  --  215  LABPROT 21.1* 24.1* 24.9*  --   INR 1.83* 2.18* 2.27*  --   CREATININE 2.85* 3.03*  --  2.90*    Estimated Creatinine Clearance: 21.9 mL/min (by C-G formula based on Cr of 2.9).  Assessment: 70 y.o. female who presents for evaluation of shortness of breath, orthopnea, and peripheral edema.  PMH: CHF, DM, HTN, CKDIV Recent admit 5/6-5/24 for PNA, and CHF exacerbation.  AC/Heme: warfarin for Hx Afib. INR 2.27  PTA: 5mg  daily except 2.5mg  on Tuesday and Thursday  Nephro: SCr 2.9  Heme: H&H 7.8/26.1, Plt 215  Goal of Therapy:  INR 2-3 Monitor platelets by anticoagulation protocol: Yes   Plan:  Warfarin 2.5 mg x 1 Daily INR, CBC q72h  Monitor s/sx of bleedingg  Levester Fresh, PharmD, BCPS, [facility] Clinical Pharmacist Pager [phone] 11/16/2015 2:05 PM

## 2015-11-16 NOTE — Progress Notes (Signed)
Pt SOB on 4L [place] after returning to the chair from the bathroom. O2 increased back to 5L [place]. O2 = 97%. Will continue to monitor.

## 2015-11-16 NOTE — Progress Notes (Signed)
    Subjective:  Denies CP; remains dyspneic   Objective:  Filed Vitals:   11/15/15 0434 11/15/15 1126 11/15/15 1959 11/16/15 0544  BP: 150/40 138/53 154/46 168/46  Pulse: 55 56 54 57  Temp: 98.5 F (36.9 C) 98.6 F (37 C) 98.2 F (36.8 C) 97.7 F (36.5 C)  TempSrc: Oral Oral Oral Oral  Resp: 20 18 20 18   Height:      Weight: 241 lb 11.2 oz (109.634 kg)   243 lb 6.4 oz (110.406 kg)  SpO2: 97% 94% 94% 94%    Intake/Output from previous day:  Intake/Output Summary (Last 24 hours) at 11/16/15 1045 Last data filed at 11/16/15 0919  Gross per 24 hour  Intake   1280 ml  Output   1400 ml  Net   -120 ml    Physical Exam: Physical exam: Well-developed well-nourished in no acute distress.  Skin is warm and dry.  HEENT is normal.  Neck is supple. Chest with diminished BS bases Cardiovascular exam is regular rate and rhythm. 2/6 systolic murmur LSB; S2 not diminished. Abdominal exam nontender or distended. No masses palpated. Extremities show 3+ edema. neuro grossly intact    Lab Results: Basic Metabolic Panel:  Recent Labs  11/14/15 0331 11/15/15 0414  NA 138 139  K 5.2* 5.2*  CL 104 104  CO2 25 28  GLUCOSE 163* 123*  BUN 82* 88*  CREATININE 2.85* 3.03*  CALCIUM 8.3* 8.5*   CBC:  Recent Labs  11/14/15 0331 11/15/15 0414  WBC 8.3 8.0  HGB 7.8* 7.9*  HCT 26.0* 26.1*  MCV 91.5 89.7  PLT 215 228     Assessment/Plan:  1 acute on chronic diastolic congestive heart failure-remains volume overloaded on examination. Most likely due to combination of diastolic dysfunction and renal insufficiency. Continue lasix 120 mg IV BID; add metolazone. 2 Chronic kidney disease stage IV-Bmet pending; her renal function will likely worsen with diuresis. Nephrology following 3 paroxysmal atrial fibrillation-Status post recent cardioversion in May. CHADSvasc 6; continue coumadin; continue amiodarone. Remains in sinus. 4 coronary artery disease-no aspirin given need for  Coumadin. Continue statin. Previous nuclear study abnormal but we are treating medically given severity of renal insufficiency and risk of contrast nephropathy. 5 hypertension-continue present medications.  Kirk Ruths 11/16/2015, 10:45 AM

## 2015-11-16 NOTE — Progress Notes (Signed)
Occupational Therapy Treatment Patient Details Name: Briana [name] MRN: [id number] DOB: [birth year] Today's Date: 11/16/2015    History of present illness Pt is a 70 y/o F who was just admitted last month for multi week (5/6-5/24) hospital stay that started with treatment in the ICU for PNA, followed by diuresis for CHF exacerbation. This was then complicated further by A.Fib RVR which was unstable and required cardioversion. Around that time she also had AKF develop that thankfully would resolve. Also had NSTEMI with trop elevation during that stay that was managed medically, no cath due to kidney function.Pt presents this admission w/ c/o SOB, peripheral edema, and DOE.  Admitting Dx: acute on chronic diastolic CHF.  Pt's PMH includes PVD, depression, anxiety, neuropathy, a-fib.   OT comments  Pt reports fatique; educated on energy conservation and short bursts of activity to increase endurance.  Educated on energy conservation and she stood one time during session  Follow Up Recommendations  SNF    Equipment Recommendations   (defer to next venue; pt has shower seat)    Recommendations for Other Services      Precautions / Restrictions Precautions Precautions: Fall Precaution Comments: O2 drops with activity Restrictions Weight Bearing Restrictions: No       Mobility Bed Mobility               General bed mobility comments: OOB--pt reports she slept in chair  Transfers       Sit to Stand: Supervision         General transfer comment: no LOB.  Pt stood for one minute    Balance             Standing balance-Leahy Scale: Fair                     ADL                                         General ADL Comments: Pt reports that she slept in chair and had used bathroom. She did not want to perform any further adls at this time.  Pt on 6 liters of 02.  She was willing to stand for a minute while I was in room (supervision  level).  Educated on energy conservation techniques.  Pt reports that she is having a hard time getting her energy back. She normally her husband and SNF is plan when she leaves.  Encouraged short burst of activity to regain strength.  Showed AE as she tends to bend forward for LB adls. 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  She was interested in seeing toilet aide. She states that husband has difficulty with hygiene, not herself.  Current strategies that pt uses including having a chair 1/2 way to bathroom to rest, taking additional time for LB adls, having seat in shower.  Encouraged her to break activities down, working within tolerance and trying AE to see if she can save some energy with less forward bending      Vision                     Perception     Praxis      Cognition   Behavior During Therapy: [facility] for tasks assessed/performed  Extremity/Trunk Assessment               Exercises     Shoulder Instructions       General Comments      Pertinent Vitals/ Pain       Pain Assessment: No/denies pain  Home Living                                          Prior Functioning/Environment              Frequency       Progress Toward Goals  OT Goals(current goals can now be found in the care plan section)     Acute Rehab OT Goals Patient Stated Goal: feel better; get energy back  Plan      Co-evaluation                 End of Session     Activity Tolerance Patient limited by fatigue   Patient Left in chair;with call bell/phone within reach   Nurse Communication          Time: [id number] OT Time Calculation (min): 22 min  Charges: OT General Charges $OT Visit: 1 Procedure OT Treatments $Therapeutic Activity: 8-22 mins  Stasha Naraine 11/16/2015, 8:41 AM  Lesle Chris, OTR/L [phone] 11/16/2015

## 2015-11-16 NOTE — Progress Notes (Addendum)
Patient ID: Briana [name], female   DOB: [birth year], 70 y.o.   MRN: [id number]  PROGRESS NOTE    Briana [name]  [id number] DOB: 1946-05-18 DOA: 11/10/2015  PCP: [name]   Brief Narrative:  70 y.o. female with past medical history significant for chronic diastolic CHF (in [id number] with EF 55%), hypertension, dyslipidemia, atrial fibrillation on anticoagulation with Coumadin, recently hospitalized 5/6-5/24 of 2017 for pneumonia which was then complicated further by A.Fib RVR which was unstable and required cardioversion. She also had NSTEMI which was managed medically and no cardiac cath due to acute kidney failure. She was discharged to inpatient rehab.   Patient presented to [facility] with worsening shortness of breath over past 2 weeks prior to this admission, or extremity edema. She was seen by primary care physician the week prior to the admission and was instructed to take Lasix twice daily whereas before she was on 40 mg once a day Lasix. There was no significant symptomatic relief for which reason she presented to ED for further evaluation.   Hospital course complicated with worsening renal function for which nephrology was consulted.    Assessment & Plan:   Acute on chronic diastolic CHF  - Has had TEE for cardioversion in 09/2015 which showed EF 55% - BNP on this admission 668  - Weight in ast 48 hours: 109.4 kg --> 109.6 kg and this am 110.4 kg - Her lasix is now 120 mg IV BID - BMP is pending this am - Monitor daily weight and strict intake and output - Seen by nephrology who recommended continuing diuresis to optimize the volume status  CKD stage 4 / Hyperkalemia  - Recent creatinine 3 weeks ago was 2.75 - Creatinine 2.38 on this admission and further trending up, pending this am - Continue phoslo  - Renal on board   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 - Continue normal lobe 70/30 mix 15 units twice daily - Sliding-scale insulin, moderate coverage ordered  - CBG's in past 24 hours: 226, 189, 157  Dyslipidemia associated with type 2 diabetes mellitus - Continue Lipitor 80 mg at bedtime - Continue daily aspirin   Essential hypertension - Continue Norvasc 10 mg daily, Bidil TID, metoprolol 25 mg PO BID   DVT prophylaxis: on full dose anticoagulation with coumadin  Code Status: full code  Family Communication: family not at the bedside this am Disposition Plan: Home once volume status optimized    Consultants:   Cardiology   Nephrology   Procedures:   None   Antimicrobials:   None    Subjective: No overnight events. No respiratory distress.   Objective: Filed Vitals:   11/15/15 0434 11/15/15 1126 11/15/15 1959 11/16/15 0544  BP: 150/40 138/53 154/46 168/46  Pulse: 55 56 54 57  Temp: 98.5 F (36.9 C) 98.6 F (37 C) 98.2 F (36.8 C) 97.7 F (36.5 C)  TempSrc: Oral Oral Oral Oral  Resp: 20 18 20 18   Height:      Weight: 109.634 kg (241 lb 11.2 oz)   110.406 kg (243 lb 6.4 oz)  SpO2: 97% 94% 94% 94%  Intake/Output Summary (Last 24 hours) at 11/16/15 0702 Last data filed at 11/16/15 0600  Gross per 24 hour  Intake   1200 ml  Output   1600 ml  Net   -400 ml   Filed Weights   11/14/15 0500 11/15/15 0434 11/16/15 0544  Weight: 109.408 kg (241 lb 3.2 oz) 109.634 kg (241 lb 11.2 oz) 110.406 kg (243 lb 6.4 oz)    Examination:  General exam: no acute distress  Respiratory system: no rhonchi, no wheezing  Cardiovascular system: S1 & S2 (+), rate controlled  Gastrointestinal system: (+) BS, non distended, non tender  Central nervous system: No  focal deficits  Extremities:  +3 LE pitting edema, non tender  Skin: warm and dry  Psychiatry: Normal mood and behavior   Data Reviewed: I have personally reviewed following labs and imaging studies  CBC:  Recent Labs Lab 11/10/15 1647 11/13/15 0217 11/14/15 0331 11/15/15 0414  WBC 9.1 8.1 8.3 8.0  NEUTROABS 6.4  --   --   --   HGB 9.2* 7.7* 7.8* 7.9*  HCT 31.3* 25.4* 26.0* 26.1*  MCV 89.9 89.4 91.5 89.7  PLT 229 209 215 [id number]   Basic Metabolic Panel:  Recent Labs Lab 11/11/15 0327 11/12/15 0232 11/13/15 0217 11/14/15 0331 11/15/15 0414  NA 137 138 137 138 139  K 4.7 5.0 5.3* 5.2* 5.2*  CL 106 103 104 104 104  CO2 24 25 24 25 28   GLUCOSE 155* 125* 156* 163* 123*  BUN 53* 66* 77* 82* 88*  CREATININE 2.38* 2.45* 2.58* 2.85* 3.03*  CALCIUM 8.7* 8.6* 8.3* 8.3* 8.5*   GFR: Estimated Creatinine Clearance: 21 mL/min (by C-G formula based on Cr of 3.03). Liver Function Tests:  Recent Labs Lab 11/10/15 1647  AST 16  ALT 15  ALKPHOS 63  BILITOT 0.4  PROT 6.6  ALBUMIN 3.5   No results for input(s): LIPASE, AMYLASE in the last 168 hours. No results for input(s): AMMONIA in the last 168 hours. Coagulation Profile:  Recent Labs Lab 11/12/15 0232 11/13/15 0217 11/14/15 0331 11/15/15 0414 11/16/15 0312  INR 2.47* 1.96* 1.83* 2.18* 2.27*   Cardiac Enzymes:  Recent Labs Lab 11/12/15 0737 11/12/15 1341 11/12/15 2027 11/13/15 0217 11/13/15 0824  TROPONINI <0.03 <0.03 0.03 <0.03 <0.03   BNP (last 3 results) No results for input(s): PROBNP in the last 8760 hours. HbA1C: No results for input(s): HGBA1C in the last 72 hours. CBG:  Recent Labs Lab 11/15/15 0559 11/15/15 1128 11/15/15 1642 11/15/15 2046 11/16/15 0559  GLUCAP 126* 241* 226* 189* 157*   Lipid Profile: No results for input(s): CHOL, HDL, LDLCALC, TRIG, CHOLHDL, LDLDIRECT in the last 72 hours. Thyroid Function Tests: No results for input(s): TSH, T4TOTAL, FREET4, T3FREE, THYROIDAB in  the last 72 hours. Anemia Panel: No results for input(s): VITAMINB12, FOLATE, FERRITIN, TIBC, IRON, RETICCTPCT in the last 72 hours. Urine analysis:    Component Value Date/Time   COLORURINE YELLOW 11/10/2015 1521   APPEARANCEUR CLEAR 11/10/2015 1521   LABSPEC 1.013 11/10/2015 1521   PHURINE 5.5 11/10/2015 1521   GLUCOSEU NEGATIVE 11/10/2015 1521   HGBUR NEGATIVE 11/10/2015 1521   BILIRUBINUR NEGATIVE 11/10/2015 1521   KETONESUR NEGATIVE 11/10/2015 1521   PROTEINUR 100* 11/10/2015 1521   UROBILINOGEN 0.2 03/02/2014 1508   NITRITE NEGATIVE 11/10/2015 1521   LEUKOCYTESUR TRACE* 11/10/2015 1521   Sepsis Labs: @LABRCNTIP (procalcitonin:4,lacticidven:4)   )No results found for this or any previous visit (from the past 240 hour(s)).    Radiology Studies: Dg  Chest 2 View 11/10/2015 Mild CHF. Atelectasis or bronchopneumonia at the medial right base. Electronically Signed   By: Monte Fantasia M.D.   On: 11/10/2015 15:12    . amiodarone  200 mg Oral Daily  . amLODipine  10 mg Oral Daily  . atorvastatin  80 mg Oral Daily  . bisacodyl  10 mg Rectal Once  . calcium acetate  1,334 mg Oral TID WC  . docusate sodium  100 mg Oral BID  . furosemide  120 mg Intravenous BID  . insulin aspart  0-15 Units Subcutaneous TID WC  . insulin aspart  0-5 Units Subcutaneous QHS  . insulin aspart protamine- aspart  15 Units Subcutaneous BID WC  . isosorbide-hydrALAZINE  2 tablet Oral TID  . metoprolol tartrate  25 mg Oral BID  . polyethylene glycol  17 g Oral BID  . protein supplement  1 scoop Oral TID WC  . sodium chloride flush  3 mL Intravenous Q12H  . Warfarin - Pharmacist Dosing Inpatient   Does not apply q1800     Continuous Infusions:    LOS: 6 days    Time spent: 25 minutes  Greater than 50% of the time spent on counseling and coordinating the care.   Leisa Lenz, MD [facility] Pager [phone]  If 7PM-7AM, please contact night-coverage [email or web address] Password  [email or web address] 11/16/2015, 7:02 AM

## 2015-11-16 NOTE — Progress Notes (Signed)
Right  Upper Extremity Vein Map    Cephalic  Segment Diameter Depth Comment  1. Axilla 3.62mm 73mm   2. Mid upper arm 3.39mm 6.46mm   3. Above AC 4.75mm 3.57mm branch  4. In AC 4.97mm 58mm   5. Below AC 5.64mm 5mm   6. Mid forearm 3.45mm 33mm   7. Wrist 3.26mm 5.73mm    mm mm    mm mm    mm mm     Left Upper Extremity Vein Map    Cephalic  Segment Diameter Depth Comment  1. Axilla 5.34mm 58mm   2. Mid upper arm 100mm 36mm   3. Above AC 7mm 8mm   4. In [facility] 5.2mm 38mm   5. Below AC 8mm 45mm   6. Mid forearm 27mm 84mm   7. Wrist 32mm 3.102mm    mm mm    mm mm    mm mm

## 2015-11-16 NOTE — Progress Notes (Signed)
Assessment:  1 CKD 3/4 2 Volume overload/CHF 3 AKI, hemodynamically mediated Plan: 1 Continue diuresis to optimize CO and RBF; increased diuretics ordered 2 Vein Map 3 Dialysis education  Subjective: Interval History: No change  Objective: Vital signs in last 24 hours: Temp:  [97.7 F (36.5 C)-98.4 F (36.9 C)] 98.4 F (36.9 C) (06/22 1100) Pulse Rate:  [54-65] 65 (06/22 1100) Resp:  [18-20] 18 (06/22 1100) BP: (129-168)/(46-49) 129/49 mmHg (06/22 1100) SpO2:  [94 %-95 %] 95 % (06/22 1100) Weight:  [110.406 kg (243 lb 6.4 oz)] 110.406 kg (243 lb 6.4 oz) (06/22 0544) Weight change: 0.771 kg (1 lb 11.2 oz)  Intake/Output from previous day: 06/21 0701 - 06/22 0700 In: 1200 [P.O.:1200] Out: 1600 [Urine:1600] Intake/Output this shift: Total I/O In: 560 [P.O.:560] Out: -   General appearance: alert and cooperative Resp: diminished breath sounds bilaterally Cardio: regular rate and rhythm, S1, S2 normal, no murmur, click, rub or gallop Extremities: edema 3+  Lab Results:  Recent Labs  11/15/15 0414 11/16/15 1108  WBC 8.0 8.6  HGB 7.9* 7.8*  HCT 26.1* 26.1*  PLT 228 215   BMET:  Recent Labs  11/15/15 0414 11/16/15 1108  NA 139 134*  K 5.2* 5.0  CL 104 99*  CO2 28 24  GLUCOSE 123* 226*  BUN 88* 94*  CREATININE 3.03* 2.90*  CALCIUM 8.5* 8.3*   No results for input(s): PTH in the last 72 hours. Iron Studies: No results for input(s): IRON, TIBC, TRANSFERRIN, FERRITIN in the last 72 hours. Studies/Results: No results found.  Scheduled: . amiodarone  200 mg Oral Daily  . amLODipine  10 mg Oral Daily  . atorvastatin  80 mg Oral Daily  . bisacodyl  10 mg Rectal Once  . calcium acetate  1,334 mg Oral TID WC  . docusate sodium  100 mg Oral BID  . furosemide  160 mg Intravenous Q6H  . insulin aspart  0-15 Units Subcutaneous TID WC  . insulin aspart  0-5 Units Subcutaneous QHS  . insulin aspart protamine- aspart  15 Units Subcutaneous BID WC  .  isosorbide-hydrALAZINE  2 tablet Oral TID  . metolazone  2.5 mg Oral Daily  . metoprolol tartrate  25 mg Oral BID  . polyethylene glycol  17 g Oral BID  . protein supplement  1 scoop Oral TID WC  . sodium chloride flush  3 mL Intravenous Q12H  . Warfarin - Pharmacist Dosing Inpatient   Does not apply q1800    LOS: 6 days   Alli Jasmer C 11/16/2015,12:54 PM

## 2015-11-17 LAB — GLUCOSE, CAPILLARY
GLUCOSE-CAPILLARY: 128 mg/dL — AB (ref 65–99)
Glucose-Capillary: 181 mg/dL — ABNORMAL HIGH (ref 65–99)
Glucose-Capillary: 267 mg/dL — ABNORMAL HIGH (ref 65–99)
Glucose-Capillary: 202 mg/dL — ABNORMAL HIGH (ref 65–99)

## 2015-11-17 LAB — PROTIME-INR
INR: 2.54 — ABNORMAL HIGH (ref 0.00–1.49)
Prothrombin Time: 27 seconds — ABNORMAL HIGH (ref 11.6–15.2)

## 2015-11-17 LAB — BASIC METABOLIC PANEL
Anion gap: 7 (ref 5–15)
BUN: 103 mg/dL — AB (ref 6–20)
CO2: 29 mmol/L (ref 22–32)
CREATININE: 3.12 mg/dL — AB (ref 0.44–1.00)
Calcium: 8.2 mg/dL — ABNORMAL LOW (ref 8.9–10.3)
Chloride: 100 mmol/L — ABNORMAL LOW (ref 101–111)
GFR calc Af Amer: 16 mL/min — ABNORMAL LOW (ref >60)
GFR, EST NON AFRICAN AMERICAN: 14 mL/min — AB (ref >60)
Glucose, Bld: 160 mg/dL — ABNORMAL HIGH (ref 65–99)
POTASSIUM: 4 mmol/L (ref 3.5–5.1)
SODIUM: 136 mmol/L (ref 135–145)

## 2015-11-17 MED ORDER — POTASSIUM CHLORIDE CRYS ER 20 MEQ PO TBCR
40.0000 meq | EXTENDED_RELEASE_TABLET | Freq: Two times a day (BID) | ORAL | Status: DC
  Administered 2015-11-17 – 2015-11-22 (×12): 40 meq via ORAL
  Filled 2015-11-17 (×13): qty 2

## 2015-11-17 MED ORDER — WARFARIN SODIUM 5 MG PO TABS
5.0000 mg | ORAL_TABLET | Freq: Once | ORAL | Status: AC
  Administered 2015-11-17: 5 mg via ORAL
  Filled 2015-11-17: qty 1

## 2015-11-17 NOTE — Progress Notes (Signed)
Assessment:  1 CKD 3/4 2 Volume overload/CHF 3 AKI, hemodynamically mediated Plan: 1 Continue diuresis to optimize CO and RBF 2 Vein Map 3 Dialysis education  Subjective: Interval History: increased UOP  Objective: Vital signs in last 24 hours: Temp:  [97.6 F (36.4 C)-98.9 F (37.2 C)] 97.6 F (36.4 C) (06/23 1201) Pulse Rate:  [50-60] 55 (06/23 1201) Resp:  [16-18] 16 (06/23 1201) BP: (134-164)/(34-47) 136/34 mmHg (06/23 1203) SpO2:  [95 %-96 %] 96 % (06/23 1201) Weight:  [109.725 kg (241 lb 14.4 oz)] 109.725 kg (241 lb 14.4 oz) (06/23 0620) Weight change: -0.68 kg (-1 lb 8 oz)  Intake/Output from previous day: 06/22 0701 - 06/23 0700 In: 1638 [P.O.:1440; IV Piggyback:198] Out: [id number] [Urine:3970] Intake/Output this shift: Total I/O In: 960 [P.O.:960] Out: 1500 [Urine:1500]  General appearance: alert and cooperative Resp: diminished breath sounds bibasilar Cardio: regular rate and rhythm, S1, S2 normal, no murmur, click, rub or gallop Extremities: edema 3-4+  Lab Results:  Recent Labs  11/15/15 0414 11/16/15 1108  WBC 8.0 8.6  HGB 7.9* 7.8*  HCT 26.1* 26.1*  PLT 228 215   BMET:  Recent Labs  11/16/15 1108 11/17/15 0218  NA 134* 136  K 5.0 4.0  CL 99* 100*  CO2 24 29  GLUCOSE 226* 160*  BUN 94* 103*  CREATININE 2.90* 3.12*  CALCIUM 8.3* 8.2*   No results for input(s): PTH in the last 72 hours. Iron Studies: No results for input(s): IRON, TIBC, TRANSFERRIN, FERRITIN in the last 72 hours. Studies/Results: No results found.  Scheduled: . amiodarone  200 mg Oral Daily  . amLODipine  10 mg Oral Daily  . atorvastatin  80 mg Oral Daily  . bisacodyl  10 mg Rectal Once  . calcium acetate  1,334 mg Oral TID WC  . docusate sodium  100 mg Oral BID  . furosemide  160 mg Intravenous Q6H  . insulin aspart  0-15 Units Subcutaneous TID WC  . insulin aspart  0-5 Units Subcutaneous QHS  . insulin aspart protamine- aspart  15 Units Subcutaneous BID WC  .  isosorbide-hydrALAZINE  2 tablet Oral TID  . metolazone  2.5 mg Oral Daily  . metoprolol tartrate  25 mg Oral BID  . polyethylene glycol  17 g Oral BID  . protein supplement  1 scoop Oral TID WC  . sodium chloride flush  3 mL Intravenous Q12H  . warfarin  5 mg Oral ONCE-1800  . Warfarin - Pharmacist Dosing Inpatient   Does not apply q1800     LOS: 7 days   Maggie Senseney C 11/17/2015,1:40 PM

## 2015-11-17 NOTE — Care Management Important Message (Signed)
Important Message  Patient Details  Name: Briana [name] MRN: [id number] Date of Birth: 04/05/1946   Medicare Important Message Given:  Yes    Loann Quill 11/17/2015, 9:45 AM

## 2015-11-17 NOTE — Progress Notes (Signed)
Patient Name: Briana [name] Date of Encounter: 11/17/2015  Hospital Problem List     Principal Problem:   Acute on chronic diastolic (congestive) heart failure (HCC) Active Problems:   Acute on chronic diastolic CHF (congestive heart failure) (HCC)   Controlled diabetes mellitus type 2 with complications (HCC)   CAD in native artery   Chronic kidney disease (CKD), stage IV (severe) ([place])   Essential hypertension   Anemia of chronic kidney failure    Subjective   Feeling ok this morning, still dyspneic with minimal exertion.  Inpatient Medications    . amiodarone  200 mg Oral Daily  . amLODipine  10 mg Oral Daily  . atorvastatin  80 mg Oral Daily  . bisacodyl  10 mg Rectal Once  . calcium acetate  1,334 mg Oral TID WC  . docusate sodium  100 mg Oral BID  . furosemide  160 mg Intravenous Q6H  . insulin aspart  0-15 Units Subcutaneous TID WC  . insulin aspart  0-5 Units Subcutaneous QHS  . insulin aspart protamine- aspart  15 Units Subcutaneous BID WC  . isosorbide-hydrALAZINE  2 tablet Oral TID  . metolazone  2.5 mg Oral Daily  . metoprolol tartrate  25 mg Oral BID  . polyethylene glycol  17 g Oral BID  . protein supplement  1 scoop Oral TID WC  . sodium chloride flush  3 mL Intravenous Q12H  . Warfarin - Pharmacist Dosing Inpatient   Does not apply q1800    Vital Signs    Filed Vitals:   11/16/15 0544 11/16/15 1100 11/16/15 2053 11/17/15 0620  BP: 168/46 129/49 157/47 164/44  Pulse: 57 65 60 50  Temp: 97.7 F (36.5 C) 98.4 F (36.9 C) 98.9 F (37.2 C) 98.3 F (36.8 C)  TempSrc: Oral Oral Oral Oral  Resp: 18 18 18 18   Height:      Weight: 243 lb 6.4 oz (110.406 kg)   241 lb 14.4 oz (109.725 kg)  SpO2: 94% 95% 96% 95%    Intake/Output Summary (Last 24 hours) at 11/17/15 0921 Last data filed at 11/17/15 0827  Gross per 24 hour  Intake   1558 ml  Output   4320 ml  Net  -2762 ml   Filed Weights   11/15/15 0434 11/16/15 0544 11/17/15 0620  Weight:  241 lb 11.2 oz (109.634 kg) 243 lb 6.4 oz (110.406 kg) 241 lb 14.4 oz (109.725 kg)    Physical Exam    General: Pleasant older female, NAD. Neuro: Alert and oriented X 3. Moves all extremities spontaneously. Psych: Normal affect. HEENT:  Normal  Neck: Supple without bruits or JVD. Lungs:  Resp regular and unlabored, Diminished bilaterally. Heart: RRR no s3, s4, 2/6 systolic murmur LSB . Abdomen: Soft, non-tender, non-distended, BS + x 4.  Extremities: No clubbing, cyanosis 3+ bilateral LE edema. DP/PT/Radials 2+ and equal bilaterally.  Labs    CBC  Recent Labs  11/15/15 0414 11/16/15 1108  WBC 8.0 8.6  HGB 7.9* 7.8*  HCT 26.1* 26.1*  MCV 89.7 89.1  PLT 228 [id number]   Basic Metabolic Panel  Recent Labs  11/16/15 1108 11/17/15 0218  NA 134* 136  K 5.0 4.0  CL 99* 100*  CO2 24 29  GLUCOSE 226* 160*  BUN 94* 103*  CREATININE 2.90* 3.12*  CALCIUM 8.3* 8.2*    Telemetry    SR  ECG    No morning EKG  Radiology      Assessment & Plan  70 yo female with PMH of DM II/HTN/HLD/PVD/CKD IV/A-fib (on coumadin)/CAD s/p CABG 2005 and chronic Diastolic CHF who presented to the [facility] with reports of worsening DOE over the past 2 weeks  1. Acute on chronic diastolic congestive heart failure-remains volume overloaded on examination. Most likely due to combination of diastolic dysfunction and renal insufficiency. Still feels SOB with minimal activity, currently on 5L [place]. --Continue lasix; added metolazone yesterday. --UOP 3.9L yesterday, weight down slightly 243>>241  2. Chronic kidney disease stage IV-Cr 2.9>>3.12, her renal function will likely worsen with diuresis. Nephrology following.  3. Paroxysmal atrial fibrillation-Status post recent cardioversion in May. CHADSvasc 6; continue coumadin; continue amiodarone. Remains in sinus.  4. Coronary artery disease-no aspirin given need for Coumadin. Continue statin. Previous nuclear study abnormal but we are treating  medically given severity of renal insufficiency and risk of contrast nephropathy.  5. Hypertension-continue present medications.  Signed, Reino Bellis NP-C Pager [phone] As above; pt seen and examined; improved diuresis yesterday (I/O -2332); continue present dose of lasix and metolazone as she remains volume overloaded; Dr Florene Glen following; will likely require dialysis. Kirk Ruths

## 2015-11-17 NOTE — Progress Notes (Signed)
Physical Therapy Treatment Patient Details Name: AZERIA ALEO MRN: [id number] DOB: 06/24/1945 Today's Date: 11/17/2015    History of Present Illness Pt is a 70 y/o F who was just admitted last month for multi week (5/6-5/24) hospital stay that started with treatment in the ICU for PNA, followed by diuresis for CHF exacerbation. This was then complicated further by A.Fib RVR which was unstable and required cardioversion. Around that time she also had AKF develop that thankfully would resolve. Also had NSTEMI with trop elevation during that stay that was managed medically, no cath due to kidney function.Pt presents this admission w/ c/o SOB, peripheral edema, and DOE.  Admitting Dx: acute on chronic diastolic CHF.  Pt's PMH includes PVD, depression, anxiety, neuropathy, a-fib.    PT Comments    Patient reported feeling fatigued, but wanted to try "to do what I can." Ambulated to/from bathroom and SaO2 88% on 4L. Seated rest with recovery to 90% after 20 seconds; to 91% after 60 seconds. See below re: exercise and Borg RPE education. Patient likely fatigued from long family visit (they were exiting on PT's arrival). Hopeful to ambulate outside room next visit.   Follow Up Recommendations  SNF;Supervision for mobility/OOB     Equipment Recommendations  Rolling walker with 5" wheels    Recommendations for Other Services       Precautions / Restrictions Precautions Precautions: Fall Precaution Comments: O2 drops with activity Restrictions Weight Bearing Restrictions: No    Mobility  Bed Mobility               General bed mobility comments: OOB--pt reports she slept in chair  Transfers Overall transfer level: Modified independent Equipment used: None Transfers: Sit to/from Stand Sit to Stand: Modified independent (Device/Increase time)         General transfer comment: Patient now walking to/from bathroom independently in room (RN  aware).  Ambulation/Gait Ambulation/Gait assistance: Modified independent (Device/Increase time) Ambulation Distance (Feet): 20 Feet (x2) Assistive device: None Gait Pattern/deviations: Step-through pattern;Decreased stride length Gait velocity: decr Gait velocity interpretation: Below normal speed for age/gender General Gait Details: pt manages oxygen tubing independently, including reaching outside her base of support to maneuver tubing over and around obstacles; decr velocity; steady   Science writer    Modified Rankin (Stroke Patients Only)       Balance     Sitting balance-Leahy Scale: Good     Standing balance support: No upper extremity supported;During functional activity Standing balance-Leahy Scale: Good                      Cognition Arousal/Alertness: Awake/alert Behavior During Therapy: WFL for tasks assessed/performed (family present; pt laughing/joking) Overall Cognitive Status: Within Functional Limits for tasks assessed                      Exercises      General Comments General comments (skin integrity, edema, etc.): Educated pt on Borg RPE scale. She reports she is learning how much activity she can tolerate based on how she is feeling, however knows that at home she was pushing herself too hard. Also discussed being able to carry a conversation while doing activity as a guide. Provided CHF exercise handout and pt reports familiar with exercises from her HHPT sessions.      Pertinent Vitals/Pain Pain Assessment: No/denies pain    Home Living  Prior Function            PT Goals (current goals can now be found in the care plan section) Acute Rehab PT Goals Patient Stated Goal: to feel better Time For Goal Achievement: 11/26/15 Progress towards PT goals: Progressing toward goals    Frequency  Min 2X/week    PT Plan Current plan remains appropriate     Co-evaluation             End of Session Equipment Utilized During Treatment: Oxygen Activity Tolerance: Patient limited by fatigue;Treatment limited secondary to medical complications (Comment) (SaO2 decreases) Patient left: in chair;with call bell/phone within reach     Time: [id number] PT Time Calculation (min) (ACUTE ONLY): 37 min  Charges:  $Gait Training: 8-22 mins $Therapeutic Exercise: 8-22 mins                    G Codes:      Remer Couse Nov 22, 2015, 4:38 PM Pager [phone]

## 2015-11-17 NOTE — Progress Notes (Signed)
ANTICOAGULATION CONSULT NOTE - F/U Consult  Pharmacy Consult for warfarin Indication: atrial fibrillation  Allergies  Allergen Reactions  . Carbamazepine Other (See Comments)    Reaction to tegretol - loopy  . Clonazepam Other (See Comments)    Unknown reaction  . Gemfibrozil Other (See Comments)    Patient is not aware of this allergy but states that she has side effects to a cholesterol medication in the past  . Macrobid [Nitrofurantoin] Nausea And Vomiting  . Oxycodone Other (See Comments)    hallucinations    Patient Measurements: Height: 5\' 4"  (162.6 cm) Weight: 241 lb 14.4 oz (109.725 kg) (scale b) IBW/kg (Calculated) : 54.7  Vital Signs: Temp: 97.6 F (36.4 C) (06/23 1201) Temp Source: Oral (06/23 1201) BP: 136/34 mmHg (06/23 1203) Pulse Rate: 55 (06/23 1201)  Labs:  Recent Labs  11/15/15 0414 11/16/15 0312 11/16/15 1108 11/17/15 0218  HGB 7.9*  --  7.8*  --   HCT 26.1*  --  26.1*  --   PLT 228  --  215  --   LABPROT 24.1* 24.9*  --  27.0*  INR 2.18* 2.27*  --  2.54*  CREATININE 3.03*  --  2.90* 3.12*    Estimated Creatinine Clearance: 20.3 mL/min (by C-G formula based on Cr of 3.12).  Assessment: 70 y.o. female who presents for evaluation of shortness of breath, orthopnea, and peripheral edema.  PMH: CHF, DM, HTN, CKDIV Recent admit 5/6-5/24 for PNA, and CHF exacerbation.  AC/Heme: warfarin for Hx Afib. INR 2.54  PTA: 5mg  daily except 2.5mg  on Tuesday and Thursday  Nephro: SCr 3.12  Heme: H&H 7.8/26.1, Plt 215  Goal of Therapy:  INR 2-3 Monitor platelets by anticoagulation protocol: Yes   Plan:  Warfarin 5 mg x 1 Daily INR, CBC q72h  Monitor s/sx of bleeding  Levester Fresh, PharmD, BCPS, [facility] Clinical Pharmacist Pager [phone] 11/17/2015 1:01 PM

## 2015-11-17 NOTE — Clinical Social Work Note (Signed)
CSW continues to follow for discharge needs. Plan is still for [facility]. CSW has kept them updated on disposition.  Dayton Scrape, [place]

## 2015-11-17 NOTE — Progress Notes (Addendum)
Patient ID: Briana [name], female   DOB: 03/18/1946, 70 y.o.   MRN: [id number]  PROGRESS NOTE    Briana [name]  [id number] DOB: 1946/03/27 DOA: 11/10/2015  PCP: [name]   Brief Narrative:  70 y.o. female with past medical history significant for chronic diastolic CHF (in [id number] TEE with EF 55%), hypertension, dyslipidemia, atrial fibrillation on anticoagulation with Coumadin, recently hospitalized 5/6-5/24 of 2017 for pneumonia which was then complicated further by A.Fib RVR which was unstable and required cardioversion. She also had NSTEMI which was managed medically and no cardiac cath due to acute kidney failure. She was discharged to inpatient rehab.   Patient presented to [facility] with worsening shortness of breath over past 2 weeks prior to this admission, or extremity edema. She was seen by primary care physician the week prior to the admission and was instructed to take Lasix twice daily whereas before she was on 40 mg once a day Lasix. There was no significant symptomatic relief for which reason she presented to ED for further evaluation.   Hospital course complicated with worsening renal function for which nephrology was consulted.    Assessment & Plan:   Acute on chronic diastolic CHF  - Has had TEE for cardioversion in 09/2015 which showed EF 55% - BNP on this admission 668  - Weight in ast 48 hours: 109.6 kg --> 110.4 kg --> 109.7 kg  - Continue current Lasix - Cardiology added metolazone - Continue daily weight and strict intake and output - Cardiology following and we appreciate their recommendations  CKD stage 4 / Hyperkalemia  - Recent creatinine 3 weeks ago was 2.75 - Creatinine 2.38 on this admission and further trending up with diuresis - Nephrology following, we appreciate their recommendations  Anemia of chronic kidney disease stage 4 - Hemoglobin stable at 7.8 - Check CBC tomorrow morning  Atrial fibrillation  - CHADS vasc score  5 - Recent TEE 09/2015 done for cardioversion - Continue amiodarone 200 mg daily and metoprolol 25 mg twice daily.  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 - Continue normal lobe 70/30 mix 15 units twice daily - Continue sliding-scale insulin, moderate coverage ordered   Dyslipidemia associated with type 2 diabetes mellitus - Continue Lipitor 80 mg at bedtime - Continue daily aspirin   Essential hypertension - Continue Norvasc 10 mg daily, Bidil TID, metoprolol 25 mg PO BID   DVT prophylaxis: on full dose anticoagulation with coumadin  Code Status: full code  Family Communication: family not at the bedside this am, called pt daughter over the phone, pt son in law answered the phone, gave update and left my cell number to call me if pt daughter has any questions  Disposition Plan: Home once volume status optimized    Consultants:   Cardiology   Nephrology   Procedures:   None   Antimicrobials:   None    Subjective: No respiratory distress. Says she feels better and less swollen.   Objective: Filed Vitals:   11/16/15 0544 11/16/15 1100 11/16/15 2053 11/17/15 0620  BP: 168/46 129/49 157/47 164/44  Pulse: 57 65 60 50  Temp: 97.7 F (36.5 C) 98.4 F (36.9 C) 98.9 F (37.2 C) 98.3 F (36.8 C)  TempSrc: Oral Oral Oral Oral  Resp: 18 18 18 18   Height:      Weight: 110.406 kg (243 lb 6.4 oz)   109.725 kg (241 lb 14.4 oz)  SpO2: 94% 95%  96% 95%    Intake/Output Summary (Last 24 hours) at 11/17/15 0701 Last data filed at 11/17/15 [id number]  Gross per 24 hour  Intake   1638 ml  Output   3970 ml  Net  -2332 ml   Filed Weights   11/15/15 0434 11/16/15 0544 11/17/15 0620  Weight: 109.634 kg (241 lb 11.2 oz) 110.406 kg (243 lb 6.4 oz) 109.725 kg (241 lb 14.4 oz)    Examination:  General exam: calm, comfortable  Respiratory system: diminished breath sounds, no wheezing   Cardiovascular system: S1 & S2 (+),  RRR Gastrointestinal system: (+) BS, non tender abdomen  Central nervous system: Nonfocal  Extremities:  +3 LE pitting edema improving, palpable pulses  Skin: no lesions or ulcers  Psychiatry: Normal mood, no agitation.    Data Reviewed: I have personally reviewed following labs and imaging studies  CBC:  Recent Labs Lab 11/10/15 1647 11/13/15 0217 11/14/15 0331 11/15/15 0414 11/16/15 1108  WBC 9.1 8.1 8.3 8.0 8.6  NEUTROABS 6.4  --   --   --   --   HGB 9.2* 7.7* 7.8* 7.9* 7.8*  HCT 31.3* 25.4* 26.0* 26.1* 26.1*  MCV 89.9 89.4 91.5 89.7 89.1  PLT 229 209 215 228 [id number]   Basic Metabolic Panel:  Recent Labs Lab 11/13/15 0217 11/14/15 0331 11/15/15 0414 11/16/15 1108 11/17/15 0218  NA 137 138 139 134* 136  K 5.3* 5.2* 5.2* 5.0 4.0  CL 104 104 104 99* 100*  CO2 24 25 28 24 29   GLUCOSE 156* 163* 123* 226* 160*  BUN 77* 82* 88* 94* 103*  CREATININE 2.58* 2.85* 3.03* 2.90* 3.12*  CALCIUM 8.3* 8.3* 8.5* 8.3* 8.2*   GFR: Estimated Creatinine Clearance: 20.3 mL/min (by C-G formula based on Cr of 3.12). Liver Function Tests:  Recent Labs Lab 11/10/15 1647  AST 16  ALT 15  ALKPHOS 63  BILITOT 0.4  PROT 6.6  ALBUMIN 3.5   No results for input(s): LIPASE, AMYLASE in the last 168 hours. No results for input(s): AMMONIA in the last 168 hours. Coagulation Profile:  Recent Labs Lab 11/13/15 0217 11/14/15 0331 11/15/15 0414 11/16/15 0312 11/17/15 0218  INR 1.96* 1.83* 2.18* 2.27* 2.54*   Cardiac Enzymes:  Recent Labs Lab 11/12/15 0737 11/12/15 1341 11/12/15 2027 11/13/15 0217 11/13/15 0824  TROPONINI <0.03 <0.03 0.03 <0.03 <0.03   BNP (last 3 results) No results for input(s): PROBNP in the last 8760 hours. HbA1C: No results for input(s): HGBA1C in the last 72 hours. CBG:  Recent Labs Lab 11/16/15 0559 11/16/15 1119 11/16/15 1638 11/16/15 2057 11/17/15 0622  GLUCAP 157* 243* 169* 281* 128*   Lipid Profile: No results for input(s): CHOL, HDL,  LDLCALC, TRIG, CHOLHDL, LDLDIRECT in the last 72 hours. Thyroid Function Tests: No results for input(s): TSH, T4TOTAL, FREET4, T3FREE, THYROIDAB in the last 72 hours. Anemia Panel: No results for input(s): VITAMINB12, FOLATE, FERRITIN, TIBC, IRON, RETICCTPCT in the last 72 hours. Urine analysis:    Component Value Date/Time   COLORURINE YELLOW 11/10/2015 1521   APPEARANCEUR CLEAR 11/10/2015 1521   LABSPEC 1.013 11/10/2015 1521   PHURINE 5.5 11/10/2015 1521   GLUCOSEU NEGATIVE 11/10/2015 1521   HGBUR NEGATIVE 11/10/2015 1521   BILIRUBINUR NEGATIVE 11/10/2015 1521   KETONESUR NEGATIVE 11/10/2015 1521   PROTEINUR 100* 11/10/2015 1521   UROBILINOGEN 0.2 03/02/2014 1508   NITRITE NEGATIVE 11/10/2015 1521   LEUKOCYTESUR TRACE* 11/10/2015 1521   Sepsis Labs: @LABRCNTIP (procalcitonin:4,lacticidven:4)   )No results found for this or any  previous visit (from the past 240 hour(s)).    Radiology Studies: Dg Chest 2 View 11/10/2015 Mild CHF. Atelectasis or bronchopneumonia at the medial right base. Electronically Signed   By: Monte Fantasia M.D.   On: 11/10/2015 15:12    . amiodarone  200 mg Oral Daily  . amLODipine  10 mg Oral Daily  . atorvastatin  80 mg Oral Daily  . bisacodyl  10 mg Rectal Once  . calcium acetate  1,334 mg Oral TID WC  . docusate sodium  100 mg Oral BID  . furosemide  160 mg Intravenous Q6H  . insulin aspart  0-15 Units Subcutaneous TID WC  . insulin aspart  0-5 Units Subcutaneous QHS  . insulin aspart protamine- aspart  15 Units Subcutaneous BID WC  . isosorbide-hydrALAZINE  2 tablet Oral TID  . metolazone  2.5 mg Oral Daily  . metoprolol tartrate  25 mg Oral BID  . polyethylene glycol  17 g Oral BID  . protein supplement  1 scoop Oral TID WC  . sodium chloride flush  3 mL Intravenous Q12H  . Warfarin - Pharmacist Dosing Inpatient   Does not apply q1800     Continuous Infusions:    LOS: 7 days    Time spent: 25 minutes  Greater than 50% of the time  spent on counseling and coordinating the care.   Leisa Lenz, MD [facility] Pager [phone]  If 7PM-7AM, please contact night-coverage [email or web address] Password [email or web address] 11/17/2015, 7:01 AM

## 2015-11-18 LAB — CBC
HCT: 25.5 % — ABNORMAL LOW (ref 36.0–46.0)
HEMOGLOBIN: 7.9 g/dL — AB (ref 12.0–15.0)
MCH: 27.2 pg (ref 26.0–34.0)
MCHC: 31 g/dL (ref 30.0–36.0)
MCV: 87.9 fL (ref 78.0–100.0)
Platelets: 207 10*3/uL (ref 150–400)
RBC: 2.9 MIL/uL — ABNORMAL LOW (ref 3.87–5.11)
RDW: 16 % — ABNORMAL HIGH (ref 11.5–15.5)
WBC: 7.5 10*3/uL (ref 4.0–10.5)

## 2015-11-18 LAB — PROTIME-INR
INR: 2.33 — AB (ref 0.00–1.49)
Prothrombin Time: 25.3 seconds — ABNORMAL HIGH (ref 11.6–15.2)

## 2015-11-18 LAB — BASIC METABOLIC PANEL
Anion gap: 14 (ref 5–15)
BUN: 108 mg/dL — AB (ref 6–20)
CHLORIDE: 95 mmol/L — AB (ref 101–111)
CO2: 25 mmol/L (ref 22–32)
CREATININE: 2.98 mg/dL — AB (ref 0.44–1.00)
Calcium: 8.3 mg/dL — ABNORMAL LOW (ref 8.9–10.3)
GFR calc Af Amer: 17 mL/min — ABNORMAL LOW (ref >60)
GFR calc non Af Amer: 15 mL/min — ABNORMAL LOW (ref >60)
GLUCOSE: 212 mg/dL — AB (ref 65–99)
Potassium: 4 mmol/L (ref 3.5–5.1)
SODIUM: 134 mmol/L — AB (ref 135–145)

## 2015-11-18 LAB — GLUCOSE, CAPILLARY
GLUCOSE-CAPILLARY: 173 mg/dL — AB (ref 65–99)
GLUCOSE-CAPILLARY: 206 mg/dL — AB (ref 65–99)
Glucose-Capillary: 158 mg/dL — ABNORMAL HIGH (ref 65–99)
Glucose-Capillary: 235 mg/dL — ABNORMAL HIGH (ref 65–99)

## 2015-11-18 MED ORDER — WARFARIN SODIUM 5 MG PO TABS
5.0000 mg | ORAL_TABLET | Freq: Once | ORAL | Status: AC
  Administered 2015-11-18: 5 mg via ORAL
  Filled 2015-11-18: qty 1

## 2015-11-18 MED ORDER — BUMETANIDE 0.25 MG/ML IJ SOLN
1.0000 mg | Freq: Two times a day (BID) | INTRAMUSCULAR | Status: DC
  Administered 2015-11-18 (×2): 1 mg via INTRAVENOUS
  Filled 2015-11-18 (×2): qty 4

## 2015-11-18 NOTE — Progress Notes (Addendum)
Patient ID: Briana [name], female   DOB: 1946/03/27, 70 y.o.   MRN: [id number]  PROGRESS NOTE    JOEE MIZENKO  [id number] DOB: 1946-02-03 DOA: 11/10/2015  PCP: [name]   Brief Narrative:  70 y.o. female with past medical history significant for chronic diastolic CHF (in [id number] TEE with EF 55%), hypertension, dyslipidemia, atrial fibrillation on anticoagulation with Coumadin, recently hospitalized 5/6-5/24 of 2017 for pneumonia which was then complicated further by A.Fib RVR which was unstable and required cardioversion. She also had NSTEMI which was managed medically and no cardiac cath due to acute kidney failure. She was discharged to inpatient rehab.   Patient presented to [facility] with worsening shortness of breath over past 2 weeks prior to this admission, or extremity edema. She was seen by primary care physician the week prior to the admission and was instructed to take Lasix twice daily whereas before she was on 40 mg once a day Lasix. There was no significant symptomatic relief for which reason she presented to ED for further evaluation.   Hospital course complicated with worsening renal function for which nephrology was consulted.    Assessment & Plan:   Acute on chronic diastolic CHF  - Has had TEE for cardioversion in 09/2015 which showed EF 55% - BNP on this admission 668  - Weight in past 48 hours: 110.4 kg --> 109.7 kg --> 109.2 kg  - Continue Lasix 160 mg IV Q 6 hours - Continue metolazone - Cardio following  - Continue daily weight and strict intake and output  CKD stage 4 / Hyperkalemia  - Recent creatinine 3 weeks ago was 2.75 - Creatinine 2.38 on this admission and further trending up with diuresis - BMP pending this am - Nephrology following   Anemia of chronic kidney disease stage 4 - Hemoglobin stable at 7.8 - CBC pending this am  Atrial fibrillation  - CHADS vasc score 5 - Recent TEE 09/2015 done for cardioversion -  Continue amiodarone 200 mg daily and metoprolol 25 mg twice daily.  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 - Continue normal lobe 70/30 mix 15 units twice daily - Continue sliding-scale insulin - CBGs in past 24 hours: 267, 202, 158  Dyslipidemia associated with type 2 diabetes mellitus - Continue Lipitor 80 mg at bedtime - Continue daily aspirin   Essential hypertension - Continue Norvasc 10 mg daily, Bidil TID, metoprolol 25 mg PO BID   DVT prophylaxis: on full dose anticoagulation with coumadin  Code Status: full code  Family Communication: family not at the bedside this am Disposition Plan: Home once volume status optimized    Consultants:   Cardiology   Nephrology   Procedures:   None   Antimicrobials:   None    Subjective: No overnight events.   Objective: Filed Vitals:   11/17/15 1203 11/17/15 2129 11/17/15 2131 11/18/15 0534  BP: 136/34 158/38 151/42 151/45  Pulse:  55 53 55  Temp:  99.1 F (37.3 C)  97.9 F (36.6 C)  TempSrc:  Oral  Oral  Resp:    16  Height:      Weight:    109.272 kg (240 lb 14.4 oz)  SpO2:  100%  97%    Intake/Output Summary (Last 24 hours) at 11/18/15 0821 Last data filed at 11/18/15 0536  Gross per 24 hour  Intake   1809 ml  Output   3975 ml  Net  -2166 ml   Filed Weights   11/16/15 0544 11/17/15 0620 11/18/15 0534  Weight: 110.406 kg (243 lb 6.4 oz) 109.725 kg (241 lb 14.4 oz) 109.272 kg (240 lb 14.4 oz)    Examination:  General exam: no acute distress Respiratory system: no wheezing, no rhonchi    Cardiovascular system: S1 & S2 (+), rate controlled  Gastrointestinal system: (+) BS, obese but not tender  Central nervous system: No focal deficits  Extremities:  +3 LE pitting edema, palpable pulses   Skin: warm and dry  Psychiatry: No agitation or restlessness    Data Reviewed: I have personally reviewed following labs and imaging  studies  CBC:  Recent Labs Lab 11/13/15 0217 11/14/15 0331 11/15/15 0414 11/16/15 1108  WBC 8.1 8.3 8.0 8.6  HGB 7.7* 7.8* 7.9* 7.8*  HCT 25.4* 26.0* 26.1* 26.1*  MCV 89.4 91.5 89.7 89.1  PLT 209 215 228 [id number]   Basic Metabolic Panel:  Recent Labs Lab 11/13/15 0217 11/14/15 0331 11/15/15 0414 11/16/15 1108 11/17/15 0218  NA 137 138 139 134* 136  K 5.3* 5.2* 5.2* 5.0 4.0  CL 104 104 104 99* 100*  CO2 24 25 28 24 29   GLUCOSE 156* 163* 123* 226* 160*  BUN 77* 82* 88* 94* 103*  CREATININE 2.58* 2.85* 3.03* 2.90* 3.12*  CALCIUM 8.3* 8.3* 8.5* 8.3* 8.2*   GFR: Estimated Creatinine Clearance: 20.3 mL/min (by C-G formula based on Cr of 3.12). Liver Function Tests: No results for input(s): AST, ALT, ALKPHOS, BILITOT, PROT, ALBUMIN in the last 168 hours. No results for input(s): LIPASE, AMYLASE in the last 168 hours. No results for input(s): AMMONIA in the last 168 hours. Coagulation Profile:  Recent Labs Lab 11/14/15 0331 11/15/15 0414 11/16/15 0312 11/17/15 0218 11/18/15 0452  INR 1.83* 2.18* 2.27* 2.54* 2.33*   Cardiac Enzymes:  Recent Labs Lab 11/12/15 0737 11/12/15 1341 11/12/15 2027 11/13/15 0217 11/13/15 0824  TROPONINI <0.03 <0.03 0.03 <0.03 <0.03   BNP (last 3 results) No results for input(s): PROBNP in the last 8760 hours. HbA1C: No results for input(s): HGBA1C in the last 72 hours. CBG:  Recent Labs Lab 11/17/15 0622 11/17/15 1158 11/17/15 1701 11/17/15 2104 11/18/15 0644  GLUCAP 128* 181* 267* 202* 158*   Lipid Profile: No results for input(s): CHOL, HDL, LDLCALC, TRIG, CHOLHDL, LDLDIRECT in the last 72 hours. Thyroid Function Tests: No results for input(s): TSH, T4TOTAL, FREET4, T3FREE, THYROIDAB in the last 72 hours. Anemia Panel: No results for input(s): VITAMINB12, FOLATE, FERRITIN, TIBC, IRON, RETICCTPCT in the last 72 hours. Urine analysis:    Component Value Date/Time   COLORURINE YELLOW 11/10/2015 1521   APPEARANCEUR CLEAR  11/10/2015 1521   LABSPEC 1.013 11/10/2015 1521   PHURINE 5.5 11/10/2015 1521   GLUCOSEU NEGATIVE 11/10/2015 1521   HGBUR NEGATIVE 11/10/2015 1521   BILIRUBINUR NEGATIVE 11/10/2015 1521   KETONESUR NEGATIVE 11/10/2015 1521   PROTEINUR 100* 11/10/2015 1521   UROBILINOGEN 0.2 03/02/2014 1508   NITRITE NEGATIVE 11/10/2015 1521   LEUKOCYTESUR TRACE* 11/10/2015 1521   Sepsis Labs: @LABRCNTIP (procalcitonin:4,lacticidven:4)   )No results found for this or any previous visit (from the past 240 hour(s)).    Radiology Studies: Dg Chest 2 View 11/10/2015 Mild CHF. Atelectasis or bronchopneumonia at the medial right base. Electronically Signed   By: Monte Fantasia M.D.   On: 11/10/2015 15:12    . amiodarone  200 mg Oral Daily  . amLODipine  10 mg Oral Daily  . atorvastatin  80 mg Oral  Daily  . bisacodyl  10 mg Rectal Once  . calcium acetate  1,334 mg Oral TID WC  . docusate sodium  100 mg Oral BID  . furosemide  160 mg Intravenous Q6H  . insulin aspart  0-15 Units Subcutaneous TID WC  . insulin aspart  0-5 Units Subcutaneous QHS  . insulin aspart protamine- aspart  15 Units Subcutaneous BID WC  . isosorbide-hydrALAZINE  2 tablet Oral TID  . metolazone  2.5 mg Oral Daily  . metoprolol tartrate  25 mg Oral BID  . polyethylene glycol  17 g Oral BID  . potassium chloride  40 mEq Oral BID  . protein supplement  1 scoop Oral TID WC  . sodium chloride flush  3 mL Intravenous Q12H  . Warfarin - Pharmacist Dosing Inpatient   Does not apply q1800     Continuous Infusions:    LOS: 8 days    Time spent: 15 minutes  Greater than 50% of the time spent on counseling and coordinating the care.   Leisa Lenz, MD [facility] Pager [phone]  If 7PM-7AM, please contact night-coverage [email or web address] Password [email or web address] 11/18/2015, 8:21 AM

## 2015-11-18 NOTE — Progress Notes (Signed)
ANTICOAGULATION CONSULT NOTE - F/U Consult  Pharmacy Consult for warfarin Indication: atrial fibrillation  Allergies  Allergen Reactions  . Carbamazepine Other (See Comments)    Reaction to tegretol - loopy  . Clonazepam Other (See Comments)    Unknown reaction  . Gemfibrozil Other (See Comments)    Patient is not aware of this allergy but states that she has side effects to a cholesterol medication in the past  . Macrobid [Nitrofurantoin] Nausea And Vomiting  . Oxycodone Other (See Comments)    hallucinations    Patient Measurements: Height: 5\' 4"  (162.6 cm) Weight: 240 lb 14.4 oz (109.272 kg) (scale b) IBW/kg (Calculated) : 54.7  Vital Signs: Temp: 98.6 F (37 C) (06/24 0900) Temp Source: Oral (06/24 0900) BP: 144/41 mmHg (06/24 0900) Pulse Rate: 53 (06/24 0900)  Labs:  Recent Labs  11/16/15 0312 11/16/15 1108 11/17/15 0218 11/18/15 0452 11/18/15 0854  HGB  --  7.8*  --   --  7.9*  HCT  --  26.1*  --   --  25.5*  PLT  --  215  --   --  207  LABPROT 24.9*  --  27.0* 25.3*  --   INR 2.27*  --  2.54* 2.33*  --   CREATININE  --  2.90* 3.12*  --  2.98*    Estimated Creatinine Clearance: 21.2 mL/min (by C-G formula based on Cr of 2.98).  Assessment: 70 y.o. female who presents for evaluation of shortness of breath, orthopnea, and peripheral edema.  PMH: CHF, DM, HTN, CKDIV Recent admit 5/6-5/24 for PNA, and CHF exacerbation.  warfarin for Hx Afib. INR today 2.33  *PTA: 5mg  daily except 2.5mg  on Tuesday and Thursday (last PTA dose 6/15) INR on admit 2.55  CBC stable, no bleeding noted   Goal of Therapy:  INR 2-3 Monitor platelets by anticoagulation protocol: Yes   Plan:  Warfarin 5 mg x 1 again tonight Daily INR, CBC q72h  Monitor s/sx of bleeding  Bailynn Dyk C. Lennox Grumbles, PharmD Pharmacy Resident  Pager: [phone] 11/18/2015 11:59 AM

## 2015-11-18 NOTE — Progress Notes (Signed)
Assessment:  1 CKD 3/4 2 Volume overload/CHF 3 Nonoliguric AKI, hemodynamically mediated Plan: 1 Continue diuresis to optimize CO and RBF 2 Furosemide changed to a comparatively very low dose of diuretic by Cardiology which I don't think will work 3 Limit fluid intake  Subjective: Interval History: Good UOP at 4 liters last 24hrs, but drinking too much  Objective: Vital signs in last 24 hours: Temp:  [97.9 F (36.6 C)-99.1 F (37.3 C)] 98.5 F (36.9 C) (06/24 1300) Pulse Rate:  [51-55] 51 (06/24 1300) Resp:  [16-20] 20 (06/24 1300) BP: (134-158)/(32-45) 134/32 mmHg (06/24 1300) SpO2:  [97 %-100 %] 97 % (06/24 1300) Weight:  [109.272 kg (240 lb 14.4 oz)] 109.272 kg (240 lb 14.4 oz) (06/24 0534) Weight change: -0.454 kg (-1 lb)  Intake/Output from previous day: 06/23 0701 - 06/24 0700 In: 1809 [P.O.:1677; IV Piggyback:132] Out: 3975 [Urine:3975] Intake/Output this shift: Total I/O In: 490 [P.O.:480; I.V.:10] Out: 1525 [Urine:1525]  General appearance: alert and cooperative Resp: diminished breath sounds bibasilar Cardio: regular rate and rhythm, S1, S2 normal, no murmur, click, rub or gallop Extremities: edema 3+  Lab Results:  Recent Labs  11/16/15 1108 11/18/15 0854  WBC 8.6 7.5  HGB 7.8* 7.9*  HCT 26.1* 25.5*  PLT 215 207   BMET:  Recent Labs  11/17/15 0218 11/18/15 0854  NA 136 134*  K 4.0 4.0  CL 100* 95*  CO2 29 25  GLUCOSE 160* 212*  BUN 103* 108*  CREATININE 3.12* 2.98*  CALCIUM 8.2* 8.3*   No results for input(s): PTH in the last 72 hours. Iron Studies: No results for input(s): IRON, TIBC, TRANSFERRIN, FERRITIN in the last 72 hours. Studies/Results: No results found.  Scheduled: . amiodarone  200 mg Oral Daily  . amLODipine  10 mg Oral Daily  . atorvastatin  80 mg Oral Daily  . bisacodyl  10 mg Rectal Once  . bumetanide  1 mg Intravenous BID  . calcium acetate  1,334 mg Oral TID WC  . docusate sodium  100 mg Oral BID  . insulin  aspart  0-15 Units Subcutaneous TID WC  . insulin aspart  0-5 Units Subcutaneous QHS  . insulin aspart protamine- aspart  15 Units Subcutaneous BID WC  . isosorbide-hydrALAZINE  2 tablet Oral TID  . metolazone  2.5 mg Oral Daily  . metoprolol tartrate  25 mg Oral BID  . polyethylene glycol  17 g Oral BID  . potassium chloride  40 mEq Oral BID  . protein supplement  1 scoop Oral TID WC  . sodium chloride flush  3 mL Intravenous Q12H  . warfarin  5 mg Oral ONCE-1800  . Warfarin - Pharmacist Dosing Inpatient   Does not apply q1800    LOS: 8 days   Briana [name] C 11/18/2015,2:04 PM

## 2015-11-18 NOTE — Progress Notes (Signed)
Patient ID: Briana [name], female   DOB: [birth year], 70 y.o.   MRN: [id number]    Patient Name: Briana [name] Date of Encounter: 11/18/2015     Principal Problem:   Acute on chronic diastolic (congestive) heart failure (HCC) Active Problems:   Acute on chronic diastolic CHF (congestive heart failure) ([place])   Controlled diabetes mellitus type 2 with complications (HCC)   CAD in native artery   Chronic kidney disease (CKD), stage IV (severe) (HCC)   Essential hypertension   Anemia of chronic kidney failure    SUBJECTIVE  Dyspnea about the same. No chest pain.  CURRENT MEDS . amiodarone  200 mg Oral Daily  . amLODipine  10 mg Oral Daily  . atorvastatin  80 mg Oral Daily  . bisacodyl  10 mg Rectal Once  . bumetanide  1 mg Intravenous Q12H  . calcium acetate  1,334 mg Oral TID WC  . docusate sodium  100 mg Oral BID  . insulin aspart  0-15 Units Subcutaneous TID WC  . insulin aspart  0-5 Units Subcutaneous QHS  . insulin aspart protamine- aspart  15 Units Subcutaneous BID WC  . isosorbide-hydrALAZINE  2 tablet Oral TID  . metolazone  2.5 mg Oral Daily  . metoprolol tartrate  25 mg Oral BID  . polyethylene glycol  17 g Oral BID  . potassium chloride  40 mEq Oral BID  . protein supplement  1 scoop Oral TID WC  . sodium chloride flush  3 mL Intravenous Q12H  . Warfarin - Pharmacist Dosing Inpatient   Does not apply q1800    OBJECTIVE  Filed Vitals:   11/17/15 2129 11/17/15 2131 11/18/15 0534 11/18/15 0900  BP: 158/38 151/42 151/45 144/41  Pulse: 55 53 55 53  Temp: 99.1 F (37.3 C)  97.9 F (36.6 C) 98.6 F (37 C)  TempSrc: Oral  Oral Oral  Resp:   16   Height:      Weight:   240 lb 14.4 oz (109.272 kg)   SpO2: 100%  97% 98%    Intake/Output Summary (Last 24 hours) at 11/18/15 1143 Last data filed at 11/18/15 1000  Gross per 24 hour  Intake   1329 ml  Output   3375 ml  Net  -2046 ml   Filed Weights   11/16/15 0544 11/17/15 0620 11/18/15 0534    Weight: 243 lb 6.4 oz (110.406 kg) 241 lb 14.4 oz (109.725 kg) 240 lb 14.4 oz (109.272 kg)    PHYSICAL EXAM  General: Pleasant, NAD. Neuro: Alert and oriented X 3. Moves all extremities spontaneously. Psych: Normal affect. HEENT:  Normal  Neck: Supple without bruits or JVD. Lungs:  Resp regular and unlabored, CTA. Heart: RRR no s3, s4, or murmurs. Abdomen: Soft, non-tender, non-distended, BS + x 4.  Extremities: No clubbing, cyanosis or edema. DP/PT/Radials 2+ and equal bilaterally.  Accessory Clinical Findings  CBC  Recent Labs  11/16/15 1108 11/18/15 0854  WBC 8.6 7.5  HGB 7.8* 7.9*  HCT 26.1* 25.5*  MCV 89.1 87.9  PLT 215 [id number]   Basic Metabolic Panel  Recent Labs  11/17/15 0218 11/18/15 0854  NA 136 134*  K 4.0 4.0  CL 100* 95*  CO2 29 25  GLUCOSE 160* 212*  BUN 103* 108*  CREATININE 3.12* 2.98*  CALCIUM 8.2* 8.3*   Liver Function Tests No results for input(s): AST, ALT, ALKPHOS, BILITOT, PROT, ALBUMIN in the last 72 hours. No results for input(s): LIPASE, AMYLASE in the last  72 hours. Cardiac Enzymes No results for input(s): CKTOTAL, CKMB, CKMBINDEX, TROPONINI in the last 72 hours. BNP Invalid input(s): POCBNP D-Dimer No results for input(s): DDIMER in the last 72 hours. Hemoglobin A1C No results for input(s): HGBA1C in the last 72 hours. Fasting Lipid Panel No results for input(s): CHOL, HDL, LDLCALC, TRIG, CHOLHDL, LDLDIRECT in the last 72 hours. Thyroid Function Tests No results for input(s): TSH, T4TOTAL, T3FREE, THYROIDAB in the last 72 hours.  Invalid input(s): FREET3  TELE nsr with RBBB   Radiology/Studies  Dg Chest 2 View  11/10/2015  CLINICAL DATA:  Shortness of breath EXAM: CHEST  2 VIEW COMPARISON:  10/11/2015 FINDINGS: Chronic cardiopericardial enlargement. Dialysis catheter has been removed. Prominence of the pulmonary arterial contour, chronic. Pulmonary venous congestion that is similar to prior. Trace left pleural effusion.  Focal opacity at the medial right base with new obscuration of the medial diaphragm. This has a streaky appearance in the lateral projection. Prior median sternotomy for CABG. IMPRESSION: Mild CHF. Atelectasis or bronchopneumonia at the medial right base. Electronically Signed   By: Monte Fantasia M.D.   On: 11/10/2015 15:12    ASSESSMENT AND PLAN  1. Acute on chronic heart failure - review of her weights demonstrates no weight loss since admit. I will try bumex instead of IV lasix.  2. Anemia - this may be contributing to her inability to diurese. I wil defer decision on trasfusion to primary service but would consider 3. Acute on chronic renal failure - her creatinine is stable. Followed by nephrology.  Reed Dady,M.D.  11/18/2015 11:43 AM

## 2015-11-19 LAB — PROTIME-INR
INR: 1.99 — AB (ref 0.00–1.49)
PROTHROMBIN TIME: 22.5 s — AB (ref 11.6–15.2)

## 2015-11-19 LAB — CBC
HCT: 23.8 % — ABNORMAL LOW (ref 36.0–46.0)
Hemoglobin: 7.3 g/dL — ABNORMAL LOW (ref 12.0–15.0)
MCH: 27 pg (ref 26.0–34.0)
MCHC: 30.7 g/dL (ref 30.0–36.0)
MCV: 88.1 fL (ref 78.0–100.0)
Platelets: 200 10*3/uL (ref 150–400)
RBC: 2.7 MIL/uL — ABNORMAL LOW (ref 3.87–5.11)
RDW: 16.3 % — AB (ref 11.5–15.5)
WBC: 7.7 10*3/uL (ref 4.0–10.5)

## 2015-11-19 LAB — GLUCOSE, CAPILLARY
GLUCOSE-CAPILLARY: 169 mg/dL — AB (ref 65–99)
GLUCOSE-CAPILLARY: 181 mg/dL — AB (ref 65–99)
GLUCOSE-CAPILLARY: 192 mg/dL — AB (ref 65–99)
Glucose-Capillary: 173 mg/dL — ABNORMAL HIGH (ref 65–99)

## 2015-11-19 LAB — BASIC METABOLIC PANEL
Anion gap: 12 (ref 5–15)
BUN: 131 mg/dL — AB (ref 6–20)
CALCIUM: 8.7 mg/dL — AB (ref 8.9–10.3)
CO2: 27 mmol/L (ref 22–32)
Chloride: 98 mmol/L — ABNORMAL LOW (ref 101–111)
Creatinine, Ser: 3.29 mg/dL — ABNORMAL HIGH (ref 0.44–1.00)
GFR calc Af Amer: 15 mL/min — ABNORMAL LOW (ref >60)
GFR, EST NON AFRICAN AMERICAN: 13 mL/min — AB (ref >60)
Glucose, Bld: 161 mg/dL — ABNORMAL HIGH (ref 65–99)
Potassium: 4.3 mmol/L (ref 3.5–5.1)
Sodium: 137 mmol/L (ref 135–145)

## 2015-11-19 MED ORDER — BUMETANIDE 0.25 MG/ML IJ SOLN
2.0000 mg | Freq: Four times a day (QID) | INTRAMUSCULAR | Status: DC
  Administered 2015-11-19 – 2015-11-21 (×10): 2 mg via INTRAVENOUS
  Filled 2015-11-19 (×12): qty 8

## 2015-11-19 MED ORDER — INSULIN ASPART 100 UNIT/ML ~~LOC~~ SOLN
0.0000 [IU] | Freq: Three times a day (TID) | SUBCUTANEOUS | Status: DC
  Administered 2015-11-19 (×2): 3 [IU] via SUBCUTANEOUS
  Administered 2015-11-20 (×2): 5 [IU] via SUBCUTANEOUS
  Administered 2015-11-20: 3 [IU] via SUBCUTANEOUS
  Administered 2015-11-21: 8 [IU] via SUBCUTANEOUS
  Administered 2015-11-21 – 2015-11-22 (×3): 3 [IU] via SUBCUTANEOUS
  Administered 2015-11-22: 5 [IU] via SUBCUTANEOUS
  Administered 2015-11-23: 3 [IU] via SUBCUTANEOUS

## 2015-11-19 MED ORDER — INSULIN ASPART 100 UNIT/ML ~~LOC~~ SOLN
0.0000 [IU] | Freq: Every day | SUBCUTANEOUS | Status: DC
  Administered 2015-11-21: 2 [IU] via SUBCUTANEOUS
  Administered 2015-11-22: 3 [IU] via SUBCUTANEOUS
  Administered 2015-11-27 – 2015-12-09 (×3): 2 [IU] via SUBCUTANEOUS

## 2015-11-19 MED ORDER — LORATADINE 10 MG PO TABS
10.0000 mg | ORAL_TABLET | Freq: Every day | ORAL | Status: DC
  Administered 2015-11-19 – 2015-12-10 (×20): 10 mg via ORAL
  Filled 2015-11-19 (×21): qty 1

## 2015-11-19 MED ORDER — WARFARIN SODIUM 5 MG PO TABS
5.0000 mg | ORAL_TABLET | Freq: Once | ORAL | Status: AC
  Administered 2015-11-19: 5 mg via ORAL
  Filled 2015-11-19: qty 1

## 2015-11-19 MED ORDER — BUMETANIDE 0.25 MG/ML IJ SOLN: 2.0000 mg | Freq: Two times a day (BID) | INTRAMUSCULAR | Status: DC

## 2015-11-19 NOTE — Progress Notes (Signed)
Pt and family request if pt can go to the solarium, pt taken in a wheelchair with 6L [place]. Pt escorted by family.

## 2015-11-19 NOTE — Progress Notes (Signed)
ANTICOAGULATION CONSULT NOTE - F/U Consult  Pharmacy Consult for warfarin Indication: atrial fibrillation  Allergies  Allergen Reactions  . Carbamazepine Other (See Comments)    Reaction to tegretol - loopy  . Clonazepam Other (See Comments)    Unknown reaction  . Gemfibrozil Other (See Comments)    Patient is not aware of this allergy but states that she has side effects to a cholesterol medication in the past  . Macrobid [Nitrofurantoin] Nausea And Vomiting  . Oxycodone Other (See Comments)    hallucinations    Patient Measurements: Height: 5\' 4"  (162.6 cm) Weight: 239 lb 14.4 oz (108.818 kg) IBW/kg (Calculated) : 54.7  Vital Signs: Temp: 99 F (37.2 C) (06/25 0904) Temp Source: Oral (06/25 0904) BP: 153/58 mmHg (06/25 0904) Pulse Rate: 51 (06/25 0904)  Labs:  Recent Labs  11/17/15 0218 11/18/15 0452 11/18/15 0854 11/19/15 0431  HGB  --   --  7.9* 7.3*  HCT  --   --  25.5* 23.8*  PLT  --   --  207 200  LABPROT 27.0* 25.3*  --  22.5*  INR 2.54* 2.33*  --  1.99*  CREATININE 3.12*  --  2.98* 3.29*    Estimated Creatinine Clearance: 19.2 mL/min (by C-G formula based on Cr of 3.29).  Assessment: 70 y.o. female who presents for evaluation of shortness of breath, orthopnea, and peripheral edema.  PMH: CHF, DM, HTN, CKDIV Recent admit 5/6-5/24 for PNA, and CHF exacerbation.  warfarin for Hx Afib.  *PTA: 5mg  daily except 2.5mg  on Tuesday and Thursday (last PTA dose 6/15) INR on admit 2.55  INR today 1.99. Hgb down to 7.3 but no bleeding noted. PLT 200  Goal of Therapy:  INR 2-3 Monitor platelets by anticoagulation protocol: Yes   Plan:  Warfarin 5 mg x 1 again tonight Daily INR, CBC q72h  Monitor renal function  Monitor s/sx of bleeding  Davion Flannery C. Lennox Grumbles, PharmD Pharmacy Resident  Pager: [phone] 11/19/2015 11:43 AM

## 2015-11-19 NOTE — Progress Notes (Signed)
Patient ID: Briana [name], female   DOB: Jan 21, 1946, 70 y.o.   MRN: [id number]    Patient Name: Briana [name] Date of Encounter: 11/19/2015     Principal Problem:   Acute on chronic diastolic (congestive) heart failure (HCC) Active Problems:   Acute on chronic diastolic CHF (congestive heart failure) ([place])   Controlled diabetes mellitus type 2 with complications (HCC)   CAD in native artery   Chronic kidney disease (CKD), stage IV (severe) (HCC)   Essential hypertension   Anemia of chronic kidney failure    SUBJECTIVE  Weight down a pound. Admits to drinking too much.  CURRENT MEDS . amiodarone  200 mg Oral Daily  . amLODipine  10 mg Oral Daily  . atorvastatin  80 mg Oral Daily  . bisacodyl  10 mg Rectal Once  . bumetanide  2 mg Intravenous BID  . calcium acetate  1,334 mg Oral TID WC  . docusate sodium  100 mg Oral BID  . insulin aspart  0-15 Units Subcutaneous TID WC  . insulin aspart  0-5 Units Subcutaneous QHS  . insulin aspart protamine- aspart  15 Units Subcutaneous BID WC  . isosorbide-hydrALAZINE  2 tablet Oral TID  . loratadine  10 mg Oral Daily  . metolazone  2.5 mg Oral Daily  . metoprolol tartrate  25 mg Oral BID  . polyethylene glycol  17 g Oral BID  . potassium chloride  40 mEq Oral BID  . protein supplement  1 scoop Oral TID WC  . sodium chloride flush  3 mL Intravenous Q12H  . Warfarin - Pharmacist Dosing Inpatient   Does not apply q1800    OBJECTIVE  Filed Vitals:   11/18/15 1300 11/18/15 2148 11/19/15 0653 11/19/15 0904  BP: 134/32 150/45 143/40 153/58  Pulse: 51 57  51  Temp: 98.5 F (36.9 C) 98.9 F (37.2 C) 98.7 F (37.1 C) 99 F (37.2 C)  TempSrc: Oral Oral Oral Oral  Resp: 20 20 20 20   Height:      Weight:   239 lb 14.4 oz (108.818 kg)   SpO2: 97% 98% 97% 98%    Intake/Output Summary (Last 24 hours) at 11/19/15 1019 Last data filed at 11/19/15 0904  Gross per 24 hour  Intake    730 ml  Output   2075 ml  Net  -1345 ml     Filed Weights   11/17/15 0620 11/18/15 0534 11/19/15 0653  Weight: 241 lb 14.4 oz (109.725 kg) 240 lb 14.4 oz (109.272 kg) 239 lb 14.4 oz (108.818 kg)    PHYSICAL EXAM  General: Pleasant, NAD. Neuro: Alert and oriented X 3. Moves all extremities spontaneously. Psych: Normal affect. HEENT:  Normal  Neck: Supple without bruits, 7 cm JVD. Lungs:  Resp regular and unlabored, CTA. Heart: RRR no s3, s4, or murmurs. Abdomen: Soft, non-tender, non-distended, BS + x 4.  Extremities: No clubbing, cyanosis, 2+ peripheral edema. DP/PT/Radials 2+ and equal bilaterally.  Accessory Clinical Findings  CBC  Recent Labs  11/18/15 0854 11/19/15 0431  WBC 7.5 7.7  HGB 7.9* 7.3*  HCT 25.5* 23.8*  MCV 87.9 88.1  PLT 207 [id number]   Basic Metabolic Panel  Recent Labs  11/18/15 0854 11/19/15 0431  NA 134* 137  K 4.0 4.3  CL 95* 98*  CO2 25 27  GLUCOSE 212* 161*  BUN 108* 131*  CREATININE 2.98* 3.29*  CALCIUM 8.3* 8.7*   Liver Function Tests No results for input(s): AST, ALT, ALKPHOS, BILITOT,  PROT, ALBUMIN in the last 72 hours. No results for input(s): LIPASE, AMYLASE in the last 72 hours. Cardiac Enzymes No results for input(s): CKTOTAL, CKMB, CKMBINDEX, TROPONINI in the last 72 hours. BNP Invalid input(s): POCBNP D-Dimer No results for input(s): DDIMER in the last 72 hours. Hemoglobin A1C No results for input(s): HGBA1C in the last 72 hours. Fasting Lipid Panel No results for input(s): CHOL, HDL, LDLCALC, TRIG, CHOLHDL, LDLDIRECT in the last 72 hours. Thyroid Function Tests No results for input(s): TSH, T4TOTAL, T3FREE, THYROIDAB in the last 72 hours.  Invalid input(s): FREET3  TELE  NSR with RBBB  Radiology/Studies  Dg Chest 2 View  11/10/2015  CLINICAL DATA:  Shortness of breath EXAM: CHEST  2 VIEW COMPARISON:  10/11/2015 FINDINGS: Chronic cardiopericardial enlargement. Dialysis catheter has been removed. Prominence of the pulmonary arterial contour, chronic.  Pulmonary venous congestion that is similar to prior. Trace left pleural effusion. Focal opacity at the medial right base with new obscuration of the medial diaphragm. This has a streaky appearance in the lateral projection. Prior median sternotomy for CABG. IMPRESSION: Mild CHF. Atelectasis or bronchopneumonia at the medial right base. Electronically Signed   By: Monte Fantasia M.D.   On: 11/10/2015 15:12    ASSESSMENT AND PLAN  1. Acute on chronic diastolic CHF - she is tolerating a change in diuretic. Will uptitrate the bumex as noted by Dr. Florene Glen. Continue metolazone 2. Acute on chronic renal failure - ultimately, she will likely need HD and this will markedly make managing her fluid status easier. 3. Atrial fib - she will continue amiodarone. Briana [name],M.D.  11/19/2015 10:19 AM

## 2015-11-19 NOTE — Progress Notes (Signed)
Patient ID: Briana [name], female   DOB: 09-Dec-1945, 70 y.o.   MRN: [id number]  PROGRESS NOTE    Briana [name]  [id number] DOB: May 05, 1946 DOA: 11/10/2015  PCP: [name]   Brief Narrative:  70 y.o. female with past medical history significant for chronic diastolic CHF (in [id number] with EF 55%), hypertension, dyslipidemia, atrial fibrillation on anticoagulation with Coumadin, recently hospitalized 5/6-5/24 of 2017 for pneumonia which was then complicated further by A.Fib RVR which was unstable and required cardioversion. She also had NSTEMI which was managed medically and no cardiac cath due to acute kidney failure. She was discharged to inpatient rehab.   Patient [facility] with worsening shortness of breath over past 2 weeks prior to this admission, or extremity edema. She was seen by primary care physician the week prior to the admission and was instructed to take Lasix twice daily whereas before she was on 40 mg once a day Lasix. There was no significant symptomatic relief for which reason she presented to ED for further evaluation.   Hospital course complicated with worsening renal function for which nephrology was consulted.    Assessment & Plan:   Acute on chronic diastolic CHF  - Still quite volume overloaded - Negative 1.8 L fluid balance since yesterday - Weight since 6/23: 109.7 kg --> 109.2 kg --> 108.8 kg - Cardio stopped lasix and switched to bumetanide 1 mg IV BID - Continue metolazone - Continue daily weight and strict intake and output  CKD stage 4 / Hyperkalemia  - Recent creatinine 3 weeks ago was 2.75 - Creatinine continues to trend up; this am 3.29 up from yesterday's value of 2.98 - Appreciate nephrology following dn their recommendations   Anemia of chronic kidney disease stage 4 - Hemoglobin 7.3 today, overall stable - No reports of bleeding - Follow up CBC in am  Atrial fibrillation  - CHADS vasc score 5 - Recent  TEE 09/2015 done for cardioversion - Continue amiodarone 200 mg daily and metoprolol 25 mg twice daily.  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 - Continue normal lobe 70/30 mix 15 units twice daily - Continue sliding-scale insulin  Dyslipidemia associated with type 2 diabetes mellitus - Continue Lipitor 80 mg at bedtime - Continue daily aspirin   Essential hypertension - Continue Norvasc 10 mg daily, Bidil TID, metoprolol 25 mg PO BID - BP 143/40   DVT prophylaxis: on full dose anticoagulation with coumadin  Code Status: full code  Family Communication: family not at the bedside this am Disposition Plan: Home once volume status optimized    Consultants:   Cardiology   Nephrology   Procedures:   None   Antimicrobials:   None    Subjective: No nasuea or vomiting, no respiratory distress.   Objective: Filed Vitals:   11/18/15 0900 11/18/15 1300 11/18/15 2148 11/19/15 0653  BP: 144/41 134/32 150/45 143/40  Pulse: 53 51 57   Temp: 98.6 F (37 C) 98.5 F (36.9 C) 98.9 F (37.2 C) 98.7 F (37.1 C)  TempSrc: Oral Oral Oral Oral  Resp:  20 20 20   Height:      Weight:    108.818 kg (239 lb 14.4 oz)  SpO2: 98% 97% 98% 97%    Intake/Output Summary (Last 24 hours) at 11/19/15 0655 Last data filed at 11/19/15 0400  Gross per 24 hour  Intake    862 ml  Output  2675 ml  Net  -1813 ml   Filed Weights   11/17/15 0620 11/18/15 0534 11/19/15 0653  Weight: 109.725 kg (241 lb 14.4 oz) 109.272 kg (240 lb 14.4 oz) 108.818 kg (239 lb 14.4 oz)    Examination:  General exam: calm and comfortable  Respiratory system: bilateral air entry, no wheezing   Cardiovascular system: S1 & S2 (+), RRR Gastrointestinal system: (+) BS, non tender Central nervous system: Nonfocal  Extremities:  +3 LE pitting edema, no tenderness  Skin: no lesions or ulcers  Psychiatry: Normal mood and behavior   Data  Reviewed: I have personally reviewed following labs and imaging studies  CBC:  Recent Labs Lab 11/14/15 0331 11/15/15 0414 11/16/15 1108 11/18/15 0854 11/19/15 0431  WBC 8.3 8.0 8.6 7.5 7.7  HGB 7.8* 7.9* 7.8* 7.9* 7.3*  HCT 26.0* 26.1* 26.1* 25.5* 23.8*  MCV 91.5 89.7 89.1 87.9 88.1  PLT 215 228 215 207 [id number]   Basic Metabolic Panel:  Recent Labs Lab 11/15/15 0414 11/16/15 1108 11/17/15 0218 11/18/15 0854 11/19/15 0431  NA 139 134* 136 134* 137  K 5.2* 5.0 4.0 4.0 4.3  CL 104 99* 100* 95* 98*  CO2 28 24 29 25 27   GLUCOSE 123* 226* 160* 212* 161*  BUN 88* 94* 103* 108* 131*  CREATININE 3.03* 2.90* 3.12* 2.98* 3.29*  CALCIUM 8.5* 8.3* 8.2* 8.3* 8.7*   GFR: Estimated Creatinine Clearance: 19.2 mL/min (by C-G formula based on Cr of 3.29). Liver Function Tests: No results for input(s): AST, ALT, ALKPHOS, BILITOT, PROT, ALBUMIN in the last 168 hours. No results for input(s): LIPASE, AMYLASE in the last 168 hours. No results for input(s): AMMONIA in the last 168 hours. Coagulation Profile:  Recent Labs Lab 11/15/15 0414 11/16/15 0312 11/17/15 0218 11/18/15 0452 11/19/15 0431  INR 2.18* 2.27* 2.54* 2.33* 1.99*   Cardiac Enzymes:  Recent Labs Lab 11/12/15 0737 11/12/15 1341 11/12/15 2027 11/13/15 0217 11/13/15 0824  TROPONINI <0.03 <0.03 0.03 <0.03 <0.03   BNP (last 3 results) No results for input(s): PROBNP in the last 8760 hours. HbA1C: No results for input(s): HGBA1C in the last 72 hours. CBG:  Recent Labs Lab 11/18/15 0644 11/18/15 1226 11/18/15 1701 11/18/15 2147 11/19/15 0607  GLUCAP 158* 173* 235* 206* 169*   Lipid Profile: No results for input(s): CHOL, HDL, LDLCALC, TRIG, CHOLHDL, LDLDIRECT in the last 72 hours. Thyroid Function Tests: No results for input(s): TSH, T4TOTAL, FREET4, T3FREE, THYROIDAB in the last 72 hours. Anemia Panel: No results for input(s): VITAMINB12, FOLATE, FERRITIN, TIBC, IRON, RETICCTPCT in the last 72  hours. Urine analysis:    Component Value Date/Time   COLORURINE YELLOW 11/10/2015 1521   APPEARANCEUR CLEAR 11/10/2015 1521   LABSPEC 1.013 11/10/2015 1521   PHURINE 5.5 11/10/2015 1521   GLUCOSEU NEGATIVE 11/10/2015 1521   HGBUR NEGATIVE 11/10/2015 1521   BILIRUBINUR NEGATIVE 11/10/2015 1521   KETONESUR NEGATIVE 11/10/2015 1521   PROTEINUR 100* 11/10/2015 1521   UROBILINOGEN 0.2 03/02/2014 1508   NITRITE NEGATIVE 11/10/2015 1521   LEUKOCYTESUR TRACE* 11/10/2015 1521   Sepsis Labs: @LABRCNTIP (procalcitonin:4,lacticidven:4)   )No results found for this or any previous visit (from the past 240 hour(s)).    Radiology Studies: Dg Chest 2 View 11/10/2015 Mild CHF. Atelectasis or bronchopneumonia at the medial right base. Electronically Signed   By: Monte Fantasia M.D.   On: 11/10/2015 15:12    . amiodarone  200 mg Oral Daily  . amLODipine  10 mg Oral Daily  . atorvastatin  80 mg Oral Daily  . bisacodyl  10 mg Rectal Once  . bumetanide  1 mg Intravenous BID  . calcium acetate  1,334 mg Oral TID WC  . docusate sodium  100 mg Oral BID  . insulin aspart  0-15 Units Subcutaneous TID WC  . insulin aspart  0-5 Units Subcutaneous QHS  . insulin aspart protamine- aspart  15 Units Subcutaneous BID WC  . isosorbide-hydrALAZINE  2 tablet Oral TID  . metolazone  2.5 mg Oral Daily  . metoprolol tartrate  25 mg Oral BID  . polyethylene glycol  17 g Oral BID  . potassium chloride  40 mEq Oral BID  . protein supplement  1 scoop Oral TID WC  . sodium chloride flush  3 mL Intravenous Q12H  . Warfarin - Pharmacist Dosing Inpatient   Does not apply q1800     Continuous Infusions:    LOS: 9 days    Time spent: 25 minutes  Greater than 50% of the time spent on counseling and coordinating the care.   Leisa Lenz, MD [facility] Pager [phone]  If 7PM-7AM, please contact night-coverage [email or web address] Password [email or web address] 11/19/2015, 6:55 AM

## 2015-11-19 NOTE — Progress Notes (Signed)
Assessment:  1 CKD 3/4 2 Volume overload/CHF 3 Nonoliguric AKI, hemodynamically mediated Plan: 1 Continue diuresis to optimize CO and RBF 2 Increase Bumex dose and to Q 6 3 Limit fluid intake  Subjective: Interval History: less UOP, improved fluid restriction  Objective: Vital signs in last 24 hours: Temp:  [98.5 F (36.9 C)-99 F (37.2 C)] 99 F (37.2 C) (06/25 0904) Pulse Rate:  [51-57] 51 (06/25 0904) Resp:  [20] 20 (06/25 0904) BP: (134-153)/(32-58) 153/58 mmHg (06/25 0904) SpO2:  [97 %-98 %] 98 % (06/25 0904) Weight:  [108.818 kg (239 lb 14.4 oz)] 108.818 kg (239 lb 14.4 oz) (06/25 0653) Weight change: -0.454 kg (-1 lb)  Intake/Output from previous day: 06/24 0701 - 06/25 0700 In: 730 [P.O.:720; I.V.:10] Out: 2675 [Urine:2675] Intake/Output this shift: Total I/O In: 240 [P.O.:240] Out: -   General appearance: alert and cooperative Back: presacral edema is present Resp: diminished breath sounds bibasilar Cardio: regular rate and rhythm, S1, S2 normal, no murmur, click, rub or gallop Extremities: edema 3+  Lab Results:  Recent Labs  11/18/15 0854 11/19/15 0431  WBC 7.5 7.7  HGB 7.9* 7.3*  HCT 25.5* 23.8*  PLT 207 200   BMET:  Recent Labs  11/18/15 0854 11/19/15 0431  NA 134* 137  K 4.0 4.3  CL 95* 98*  CO2 25 27  GLUCOSE 212* 161*  BUN 108* 131*  CREATININE 2.98* 3.29*  CALCIUM 8.3* 8.7*   No results for input(s): PTH in the last 72 hours. Iron Studies: No results for input(s): IRON, TIBC, TRANSFERRIN, FERRITIN in the last 72 hours. Studies/Results: No results found.  Scheduled: . amiodarone  200 mg Oral Daily  . amLODipine  10 mg Oral Daily  . atorvastatin  80 mg Oral Daily  . bisacodyl  10 mg Rectal Once  . bumetanide  2 mg Intravenous BID  . calcium acetate  1,334 mg Oral TID WC  . docusate sodium  100 mg Oral BID  . insulin aspart  0-15 Units Subcutaneous TID WC  . insulin aspart  0-5 Units Subcutaneous QHS  . insulin aspart  protamine- aspart  15 Units Subcutaneous BID WC  . isosorbide-hydrALAZINE  2 tablet Oral TID  . loratadine  10 mg Oral Daily  . metolazone  2.5 mg Oral Daily  . metoprolol tartrate  25 mg Oral BID  . polyethylene glycol  17 g Oral BID  . potassium chloride  40 mEq Oral BID  . protein supplement  1 scoop Oral TID WC  . sodium chloride flush  3 mL Intravenous Q12H  . Warfarin - Pharmacist Dosing Inpatient   Does not apply q1800    LOS: 9 days   Ellington Greenslade C 11/19/2015,11:01 AM

## 2015-11-20 LAB — PROTIME-INR
INR: 1.85 — ABNORMAL HIGH (ref 0.00–1.49)
PROTHROMBIN TIME: 21.3 s — AB (ref 11.6–15.2)

## 2015-11-20 LAB — GLUCOSE, CAPILLARY
GLUCOSE-CAPILLARY: 221 mg/dL — AB (ref 65–99)
GLUCOSE-CAPILLARY: 159 mg/dL — AB (ref 65–99)
Glucose-Capillary: 156 mg/dL — ABNORMAL HIGH (ref 65–99)
Glucose-Capillary: 223 mg/dL — ABNORMAL HIGH (ref 65–99)

## 2015-11-20 LAB — BASIC METABOLIC PANEL
Anion gap: 12 (ref 5–15)
BUN: 137 mg/dL — ABNORMAL HIGH (ref 6–20)
CHLORIDE: 98 mmol/L — AB (ref 101–111)
CO2: 28 mmol/L (ref 22–32)
CREATININE: 3.56 mg/dL — AB (ref 0.44–1.00)
Calcium: 9.1 mg/dL (ref 8.9–10.3)
GFR calc non Af Amer: 12 mL/min — ABNORMAL LOW (ref >60)
GFR, EST AFRICAN AMERICAN: 14 mL/min — AB (ref >60)
Glucose, Bld: 248 mg/dL — ABNORMAL HIGH (ref 65–99)
Potassium: 4.6 mmol/L (ref 3.5–5.1)
Sodium: 138 mmol/L (ref 135–145)

## 2015-11-20 MED ORDER — WARFARIN SODIUM 7.5 MG PO TABS
7.5000 mg | ORAL_TABLET | Freq: Once | ORAL | Status: AC
  Administered 2015-11-20: 7.5 mg via ORAL
  Filled 2015-11-20: qty 1

## 2015-11-20 MED ORDER — DARBEPOETIN ALFA 60 MCG/0.3ML IJ SOSY
60.0000 ug | PREFILLED_SYRINGE | Freq: Once | INTRAMUSCULAR | Status: AC
  Administered 2015-11-20: 60 ug via SUBCUTANEOUS
  Filled 2015-11-20: qty 0.3

## 2015-11-20 MED ORDER — WARFARIN SODIUM 5 MG PO TABS: 5.0000 mg | ORAL_TABLET | Freq: Once | ORAL | Status: DC

## 2015-11-20 MED ORDER — METOLAZONE 5 MG PO TABS
5.0000 mg | ORAL_TABLET | Freq: Every day | ORAL | Status: DC
  Administered 2015-11-21 – 2015-11-26 (×6): 5 mg via ORAL
  Filled 2015-11-20 (×6): qty 1

## 2015-11-20 NOTE — Care Management Important Message (Signed)
Important Message  Patient Details  Name: Briana [name] MRN: [id number] Date of Birth: 1946/03/05   Medicare Important Message Given:  Yes    Loann Quill 11/20/2015, 8:49 AM

## 2015-11-20 NOTE — Progress Notes (Signed)
Patient Name: Briana [name] Date of Encounter: 11/20/2015  Principal Problem:   Acute on chronic diastolic (congestive) heart failure (HCC) Active Problems:   Acute on chronic diastolic CHF (congestive heart failure) ([place])   Controlled diabetes mellitus type 2 with complications ([place])   CAD in native artery   Chronic kidney disease (CKD), stage IV (severe) ([place])   Essential hypertension   Anemia of chronic kidney failure   Primary Cardiologist: Dr Bronson Ing  Patient Profile: 70 yo female with PMH of DM II/HTN/HLD/PVD/CKD IV/A-fib (on coumadin w/ CHADS2VASC=5)/CAD s/p CABG 2005 and chronic Diastolic CHF admitted [id number] w/ CHF (EF 55-60% w/ grade 3 dd by echo 09/2015)  SUBJECTIVE: Feels poorly today, achy in multiple areas, wonders if her heart can get stronger.  OBJECTIVE Filed Vitals:   11/19/15 0904 11/19/15 1200 11/19/15 2210 11/20/15 0437  BP: 153/58 134/47 145/38 174/62  Pulse: 51 50 52 50  Temp: 99 F (37.2 C)  98.6 F (37 C) 98.2 F (36.8 C)  TempSrc: Oral  Oral Oral  Resp: 20 20 20 20   Height:      Weight:    241 lb (109.317 kg)  SpO2: 98% 100% 97% 95%    Intake/Output Summary (Last 24 hours) at 11/20/15 0948 Last data filed at 11/20/15 0329  Gross per 24 hour  Intake    360 ml  Output   1200 ml  Net   -840 ml   Filed Weights   11/18/15 0534 11/19/15 0653 11/20/15 0437  Weight: 240 lb 14.4 oz (109.272 kg) 239 lb 14.4 oz (108.818 kg) 241 lb (109.317 kg)    PHYSICAL EXAM General: Well developed, well nourished, female in no acute distress. Head: Normocephalic, atraumatic.  Neck: Supple without bruits, JVD is present. Lungs:  Resp regular and unlabored, CTA. Heart: RRR, S1, S2, no S3, S4, or murmur; no rub. Abdomen: Soft, non-tender, non-distended, BS + x 4.  Extremities: No clubbing, cyanosis, there is 2+ pretibial edema.  Neuro: Alert and oriented X 3. Moves all extremities spontaneously. Psych: Normal affect.  LABS: CBC: Recent Labs  11/18/15 0854 11/19/15 0431  WBC 7.5 7.7  HGB 7.9* 7.3*  HCT 25.5* 23.8*  MCV 87.9 88.1  PLT 207 200   INR: Recent Labs  11/20/15 0542  INR 0000000*   Basic Metabolic Panel: Recent Labs  11/18/15 0854 11/19/15 0431  NA 134* 137  K 4.0 4.3  CL 95* 98*  CO2 25 27  GLUCOSE 212* 161*  BUN 108* 131*  CREATININE 2.98* 3.29*  CALCIUM 8.3* 8.7*   BNP:  B NATRIURETIC PEPTIDE  Date/Time Value Ref Range Status  11/10/2015 04:47 PM 668.4* 0.0 - 100.0 pg/mL Final  10/02/2015 10:04 PM 1262.5* 0.0 - 100.0 pg/mL Final   TELE:   Sinus brady, sustained low 50s, 40s, 1st deg AV block    Radiology/Studies: Dg Chest 2 View 11/10/2015  CLINICAL DATA:  Shortness of breath EXAM: CHEST  2 VIEW COMPARISON:  10/11/2015 FINDINGS: Chronic cardiopericardial enlargement. Dialysis catheter has been removed. Prominence of the pulmonary arterial contour, chronic. Pulmonary venous congestion that is similar to prior. Trace left pleural effusion. Focal opacity at the medial right base with new obscuration of the medial diaphragm. This has a streaky appearance in the lateral projection. Prior median sternotomy for CABG. IMPRESSION: Mild CHF. Atelectasis or bronchopneumonia at the medial right base. Electronically Signed   By: Monte Fantasia M.D.   On: 11/10/2015 15:12   Current Medications:  . amiodarone  200 mg Oral Daily  . amLODipine  10 mg Oral Daily  . atorvastatin  80 mg Oral Daily  . bisacodyl  10 mg Rectal Once  . bumetanide  2 mg Intravenous Q6H  . calcium acetate  1,334 mg Oral TID WC  . docusate sodium  100 mg Oral BID  . insulin aspart  0-15 Units Subcutaneous TID WC  . insulin aspart  0-5 Units Subcutaneous QHS  . insulin aspart protamine- aspart  15 Units Subcutaneous BID WC  . isosorbide-hydrALAZINE  2 tablet Oral TID  . loratadine  10 mg Oral Daily  . metolazone  2.5 mg Oral Daily  . metoprolol tartrate  25 mg Oral BID  . polyethylene glycol  17 g Oral BID  . potassium chloride  40  mEq Oral BID  . protein supplement  1 scoop Oral TID WC  . sodium chloride flush  3 mL Intravenous Q12H  . Warfarin - Pharmacist Dosing Inpatient   Does not apply q1800      ASSESSMENT AND PLAN: 1. Acute on chronic diastolic CHF -  Dr. Powell/Nephrology managing diuretics. Weight not changing much but I/O negative  2. Acute on chronic renal failure - ultimately, she will likely need HD and this will markedly make managing her fluid status easier, per IM/Nephrology . 3. Atrial fib - she will continue amiodarone. Holding SR, mostly sinus brady high 40s. Asymptomatic on metoprolol 25 mg bid. MD advise if med changes needed.  4. Chronic anticoagulation: coumadin per pharmacy, CHADS2VASC=5 (HTN, CAD, CHF, female, age x 1)  Otherwise, per IM Principal Problem:   Acute on chronic diastolic (congestive) heart failure (HCC) Active Problems:   Acute on chronic diastolic CHF (congestive heart failure) ([place])   Controlled diabetes mellitus type 2 with complications (HCC)   CAD in native artery   Chronic kidney disease (CKD), stage IV (severe) ([place])   Essential hypertension   Anemia of chronic kidney failure   Signed, Rosaria Ferries , PA-C 9:48 AM 11/20/2015  Patient seen, examined. Available data reviewed. Agree with findings, assessment, and plan as outlined by Rosaria Ferries, PA-C. Exam reveals obese woman in NAD, JVP elevated, lungs CTA, heart RRR With 2/6 systolic murmur at the RUSB, extremities with 2+ edema. Labs, I/O reviewed. Good diuresis but still volume excess on exam. BUN 131. High dose diuretics per renal team. I think high likelihood she will require HD this admission with weight remaining up and continued volume excess but defer to renal team. Pt counseled. Not much to offer from cardiac perspective at this point, but will follow with you.  Sherren Mocha, M.D. 11/20/2015 12:01 PM

## 2015-11-20 NOTE — Progress Notes (Signed)
Physical Therapy Treatment Patient Details Name: Briana [name] MRN: [id number] DOB: 01-31-1946 Today's Date: 11/20/2015    History of Present Illness Pt is a 70 y/o F who was just admitted last month for multi week (5/6-5/24) hospital stay that started with treatment in the ICU for PNA, followed by diuresis for CHF exacerbation. This was then complicated further by A.Fib RVR which was unstable and required cardioversion. Around that time she also had AKF develop that thankfully would resolve. Also had NSTEMI with trop elevation during that stay that was managed medically, no cath due to kidney function.Pt presents this admission w/ c/o SOB, peripheral edema, and DOE.  Admitting Dx: acute on chronic diastolic CHF.  Pt's PMH includes PVD, depression, anxiety, neuropathy, a-fib.    PT Comments    Very limited session due to "aching, hurting all over...like when you have the flu." Patient verbalized one method she has used to conserve energy this pm (placing her phone on bed table and on speaker phone--to prevent having to hold phone and tire her arms). Encouraged pt to continue to be attentive to her symptoms to help guide her level of activity (she is very motivated, independent-minded and I am not concerned she will limit her activity unnecessarily. If anything, she has a tendency to do too much.)   Follow Up Recommendations  SNF;Supervision for mobility/OOB     Equipment Recommendations  Rolling walker with 5" wheels    Recommendations for Other Services       Precautions / Restrictions Precautions Precautions: Fall Precaution Comments: O2 drops with activity Restrictions Weight Bearing Restrictions: No    Mobility  Bed Mobility               General bed mobility comments: OOB--pt reports she sleeps in chair  Transfers Overall transfer level: Modified independent Equipment used: None Transfers: Sit to/from Stand Sit to Stand: Modified independent (Device/Increase  time)            Ambulation/Gait Ambulation/Gait assistance: Min guard Ambulation Distance (Feet): 20 Feet Assistive device: None (pt reaching for surfaces to steady herself) Gait Pattern/deviations: Step-through pattern;Decreased stride length;Staggering right Gait velocity: decr Gait velocity interpretation: Below normal speed for age/gender General Gait Details: moving slowly with stagger step x1 with minguard assist   Stairs            Wheelchair Mobility    Modified Rankin (Stroke Patients Only)       Balance Overall balance assessment: Needs assistance   Sitting balance-Leahy Scale: Good     Standing balance support: No upper extremity supported Standing balance-Leahy Scale: Fair                      Cognition Arousal/Alertness: Awake/alert Behavior During Therapy: WFL for tasks assessed/performed (still joking, but looks very fatigued) Overall Cognitive Status: Within Functional Limits for tasks assessed                      Exercises General Exercises - Lower Extremity Ankle Circles/Pumps: AROM;Both;Other reps (comment) (2; reports shooting pains up anterior legs) Quad Sets: AROM;Both;5 reps Other Exercises Other Exercises: Discussed the incr activity and exercises pt had yesterday. She feels she may have overdone things. lower legs noticeably more edematous and tight (had feet down on arrival and admitted down alot yesterday).     General Comments General comments (skin integrity, edema, etc.): Emphasized need to call for assist with to/from bathroom as she is not steady today. Patient  refusing to do so and nursing aware. She wants to be able to walk to keep her strength up. Again explained risks and offered use of RW. Pt stated she would consider RW, "but not yet"      Pertinent Vitals/Pain Pain Assessment: Faces Faces Pain Scale: Hurts even more Pain Location: "all over" reports aching like when you have the flu Pain Descriptors  / Indicators: Aching;Grimacing Pain Intervention(s): Limited activity within patient's tolerance;Repositioned    Home Living                      Prior Function            PT Goals (current goals can now be found in the care plan section) Acute Rehab PT Goals Patient Stated Goal: to feel better Time For Goal Achievement: 11/26/15 Progress towards PT goals: Not progressing toward goals - comment (incr pain and edema)    Frequency  Min 2X/week    PT Plan Current plan remains appropriate    Co-evaluation             End of Session Equipment Utilized During Treatment: Oxygen Activity Tolerance: Patient limited by fatigue;Patient limited by pain Patient left: in chair;with call bell/phone within reach     Time: [id number] PT Time Calculation (min) (ACUTE ONLY): 25 min  Charges:  $Therapeutic Exercise: 8-22 mins $Therapeutic Activity: 8-22 mins                    G Codes:      Mayur Duman 12/14/15, 4:52 PM  Pager [phone]

## 2015-11-20 NOTE — Progress Notes (Signed)
  Briana [name] Progress Note    Assessment/ Plan:   1 CKD 3/4 - repeat labs in the am. 2 Volume overload/CHF - Continue diuresis to optimize CO and RBF - Will increase the Metolazone to 5mg  daily --> if UOP doesn't improve then will increase BUMEX to 4mg  q6r. - If uptitration of diuretics doesn't improve volume status or if renal function deteriorates significantly then will need to consider HD for UF. Spoke to pt about it today and she wants to try to medically manage first. - Already on fluid restriction 3 Nonoliguric AKI, hemodynamically mediated   Subjective:   She doesn't feel much improvement from time of hospitalization. Denies fever, chills, had a nonproductive cough a few days ago.   Objective:   BP 174/62 mmHg  Pulse 50  Temp(Src) 98.2 F (36.8 C) (Oral)  Resp 20  Ht 5\' 4"  (1.626 m)  Wt 109.317 kg (241 lb)  BMI 41.35 kg/m2  SpO2 95%  Intake/Output Summary (Last 24 hours) at 11/20/15 1100 Last data filed at 11/20/15 1035  Gross per 24 hour  Intake    480 ml  Output   2100 ml  Net  -1620 ml   Weight change: 0.499 kg (1 lb 1.6 oz)  Physical Exam:  General appearance: alert and cooperative Back: presacral edema is present Resp: diminished breath sounds bibasilar rales Cardio: regular rate and rhythm, S1, S2 normal, no murmur, click, rub or gallop Extremities: edema 3+  Imaging: No results found.  Labs: BMET  Recent Labs Lab 11/14/15 0331 11/15/15 0414 11/16/15 1108 11/17/15 0218 11/18/15 0854 11/19/15 0431  NA 138 139 134* 136 134* 137  K 5.2* 5.2* 5.0 4.0 4.0 4.3  CL 104 104 99* 100* 95* 98*  CO2 25 28 24 29 25 27   GLUCOSE 163* 123* 226* 160* 212* 161*  BUN 82* 88* 94* 103* 108* 131*  CREATININE 2.85* 3.03* 2.90* 3.12* 2.98* 3.29*  CALCIUM 8.3* 8.5* 8.3* 8.2* 8.3* 8.7*   CBC  Recent Labs Lab 11/15/15 0414 11/16/15 1108 11/18/15 0854 11/19/15 0431  WBC 8.0 8.6 7.5 7.7  HGB 7.9* 7.8* 7.9* 7.3*  HCT 26.1* 26.1* 25.5*  23.8*  MCV 89.7 89.1 87.9 88.1  PLT 228 215 207 200    Medications:    . amiodarone  200 mg Oral Daily  . amLODipine  10 mg Oral Daily  . atorvastatin  80 mg Oral Daily  . bisacodyl  10 mg Rectal Once  . bumetanide  2 mg Intravenous Q6H  . calcium acetate  1,334 mg Oral TID WC  . docusate sodium  100 mg Oral BID  . insulin aspart  0-15 Units Subcutaneous TID WC  . insulin aspart  0-5 Units Subcutaneous QHS  . insulin aspart protamine- aspart  15 Units Subcutaneous BID WC  . isosorbide-hydrALAZINE  2 tablet Oral TID  . loratadine  10 mg Oral Daily  . [START ON 11/21/2015] metolazone  5 mg Oral Daily  . metoprolol tartrate  25 mg Oral BID  . polyethylene glycol  17 g Oral BID  . potassium chloride  40 mEq Oral BID  . protein supplement  1 scoop Oral TID WC  . sodium chloride flush  3 mL Intravenous Q12H  . warfarin  7.5 mg Oral ONCE-1800  . Warfarin - Pharmacist Dosing Inpatient   Does not apply q1800      Otelia Santee, MD 11/20/2015, 11:00 AM

## 2015-11-20 NOTE — Progress Notes (Signed)
ANTICOAGULATION CONSULT NOTE - F/U Consult  Pharmacy Consult for warfarin Indication: atrial fibrillation  Allergies  Allergen Reactions  . Carbamazepine Other (See Comments)    Reaction to tegretol - loopy  . Clonazepam Other (See Comments)    Unknown reaction  . Gemfibrozil Other (See Comments)    Patient is not aware of this allergy but states that she has side effects to a cholesterol medication in the past  . Macrobid [Nitrofurantoin] Nausea And Vomiting  . Oxycodone Other (See Comments)    hallucinations    Patient Measurements: Height: 5\' 4"  (162.6 cm) Weight: 241 lb (109.317 kg) IBW/kg (Calculated) : 54.7  Vital Signs: Temp: 98.2 F (36.8 C) (06/26 0437) Temp Source: Oral (06/26 0437) BP: 174/62 mmHg (06/26 0437) Pulse Rate: 50 (06/26 0437)  Labs:  Recent Labs  11/18/15 0452 11/18/15 0854 11/19/15 0431 11/20/15 0542  HGB  --  7.9* 7.3*  --   HCT  --  25.5* 23.8*  --   PLT  --  207 200  --   LABPROT 25.3*  --  22.5* 21.3*  INR 2.33*  --  1.99* 1.85*  CREATININE  --  2.98* 3.29*  --     Estimated Creatinine Clearance: 19.2 mL/min (by C-G formula based on Cr of 3.29).  Assessment: 70 y.o. female who presents for evaluation of shortness of breath, orthopnea, and peripheral edema. PMH: CHF, DM, HTN, CKDIV. Recent admit 5/6-5/24 for PNA, and CHF exacerbation.  Warfarin for Hx Afib.  *PTA dose: 5mg  daily except 2.5mg  on Tues/Thurs (last PTA dose 6/15) INR on admit 2.55 INR today 1.85, subtherapeutic today. Hgb down to 7.3 yesterday but no bleeding noted. Will give one time warfarin 7.5 mg dose and f/u INR in the morning.  Goal of Therapy:  INR 2-3 Monitor platelets by anticoagulation protocol: Yes   Plan:  Warfarin 7.5 mg x 1 tonight Daily INR, CBC q72h  Monitor s/sx of bleeding   Thank you for allowing [place] to participate in this patients care. Jens Som, PharmD Pager: [phone]  11/20/2015 10:41 AM

## 2015-11-20 NOTE — Progress Notes (Addendum)
Patient ID: Briana [name], female   DOB: June 02, 1945, 70 y.o.   MRN: [id number]  PROGRESS NOTE    Briana [name]  [id number] DOB: 05/17/1946 DOA: 11/10/2015  PCP: [name]   Brief Narrative:  70 y.o. female with past medical history significant for chronic diastolic CHF (in [id number] [id number] with EF 55%), hypertension, dyslipidemia, atrial fibrillation on anticoagulation with Coumadin, recently hospitalized 5/6-5/24 of 2017 for pneumonia which was then complicated further by A.Fib RVR which was unstable and required cardioversion. She also had NSTEMI which was managed medically and no cardiac cath due to acute kidney failure. She was discharged to inpatient rehab.   Patient presented to [facility] with worsening shortness of breath over past 2 weeks prior to this admission, or extremity edema. She was seen by primary care physician the week prior to the admission and was instructed to take Lasix twice daily whereas before she was on 40 mg once a day Lasix. There was no significant symptomatic relief for which reason she presented to ED for further evaluation.   Hospital course complicated with worsening renal function and fluid overload for which nephrology was consulted.    Assessment & Plan:   Acute on chronic diastolic CHF  - Weight since 6/23: 109.7 kg --> 109.2 kg --> 108.8 kg --> 109.3 kg - Cardio stopped lasix and switched to bumetanide 1 mg IV BID 6/25 which was further increased to 2 mg IV Q 6 hours 6/25 - Continue metolazone - Continue daily weight and strict intake and output - Supplement electrolytes as needed  CKD stage 4 / Hyperkalemia  - Creatinine 3 weeks ago was 2.75 - Cr on this admission up at 3.29 - On IV lasix and then bumetanide  - Renal following - Pt may ned HD eventually to optimize volume status   Anemia of chronic kidney disease stage 4 - Hemoglobin 7.3 - CBC pending this am   Atrial fibrillation  - CHADS vasc score 5 - Recent TEE  09/2015 done for cardioversion - Continue amiodarone 200 mg daily and metoprolol 25 mg twice daily.  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 - Continue normal lobe 70/30 mix 15 units twice daily - Continue sliding-scale insulin - CBGs in past 24 hours: 192, 173, 156  Dyslipidemia associated with type 2 diabetes mellitus - Continue Lipitor 80 mg at bedtime - Continue daily aspirin   Essential hypertension - Continue Norvasc 10 mg daily, Bidil TID, metoprolol 25 mg PO BID    DVT prophylaxis: on full dose anticoagulation with coumadin  Code Status: full code  Family Communication: family not at the bedside this am, called her daughter over the phone to give an update 6/26 Disposition Plan: Home once volume status optimized    Consultants:   Cardiology   Nephrology   Procedures:   None   Antimicrobials:   None    Subjective: No overnight events.   Objective: Filed Vitals:   11/19/15 0904 11/19/15 1200 11/19/15 2210 11/20/15 0437  BP: 153/58 134/47 145/38 174/62  Pulse: 51 50 52 50  Temp: 99 F (37.2 C)  98.6 F (37 C) 98.2 F (36.8 C)  TempSrc: Oral  Oral Oral  Resp: 20 20 20 20   Height:      Weight:    109.317 kg (241 lb)  SpO2: 98% 100% 97% 95%    Intake/Output Summary (Last 24 hours) at 11/20/15 1107 Last data filed  at 11/20/15 1035  Gross per 24 hour  Intake    480 ml  Output   2100 ml  Net  -1620 ml   Filed Weights   11/18/15 0534 11/19/15 0653 11/20/15 0437  Weight: 109.272 kg (240 lb 14.4 oz) 108.818 kg (239 lb 14.4 oz) 109.317 kg (241 lb)    Examination:  General exam: no acute distress  Respiratory system: no wheezing, no rhonchi  Cardiovascular system: S1 & S2 (+), Rate controlled  Gastrointestinal system: (+) BS, no distention  Central nervous system: No focal deficits  Extremities:  +3 LE pitting edema, palpable pulses  Skin: warm and dry skin Psychiatry: No  restlessness or agitation  Data Reviewed: I have personally reviewed following labs and imaging studies  CBC:  Recent Labs Lab 11/14/15 0331 11/15/15 0414 11/16/15 1108 11/18/15 0854 11/19/15 0431  WBC 8.3 8.0 8.6 7.5 7.7  HGB 7.8* 7.9* 7.8* 7.9* 7.3*  HCT 26.0* 26.1* 26.1* 25.5* 23.8*  MCV 91.5 89.7 89.1 87.9 88.1  PLT 215 228 215 207 [id number]   Basic Metabolic Panel:  Recent Labs Lab 11/15/15 0414 11/16/15 1108 11/17/15 0218 11/18/15 0854 11/19/15 0431  NA 139 134* 136 134* 137  K 5.2* 5.0 4.0 4.0 4.3  CL 104 99* 100* 95* 98*  CO2 28 24 29 25 27   GLUCOSE 123* 226* 160* 212* 161*  BUN 88* 94* 103* 108* 131*  CREATININE 3.03* 2.90* 3.12* 2.98* 3.29*  CALCIUM 8.5* 8.3* 8.2* 8.3* 8.7*   GFR: Estimated Creatinine Clearance: 19.2 mL/min (by C-G formula based on Cr of 3.29). Liver Function Tests: No results for input(s): AST, ALT, ALKPHOS, BILITOT, PROT, ALBUMIN in the last 168 hours. No results for input(s): LIPASE, AMYLASE in the last 168 hours. No results for input(s): AMMONIA in the last 168 hours. Coagulation Profile:  Recent Labs Lab 11/16/15 0312 11/17/15 0218 11/18/15 0452 11/19/15 0431 11/20/15 0542  INR 2.27* 2.54* 2.33* 1.99* 1.85*   Cardiac Enzymes: No results for input(s): CKTOTAL, CKMB, CKMBINDEX, TROPONINI in the last 168 hours. BNP (last 3 results) No results for input(s): PROBNP in the last 8760 hours. HbA1C: No results for input(s): HGBA1C in the last 72 hours. CBG:  Recent Labs Lab 11/19/15 0607 11/19/15 1200 11/19/15 1621 11/19/15 2209 11/20/15 0632  GLUCAP 169* 181* 192* 173* 156*   Lipid Profile: No results for input(s): CHOL, HDL, LDLCALC, TRIG, CHOLHDL, LDLDIRECT in the last 72 hours. Thyroid Function Tests: No results for input(s): TSH, T4TOTAL, FREET4, T3FREE, THYROIDAB in the last 72 hours. Anemia Panel: No results for input(s): VITAMINB12, FOLATE, FERRITIN, TIBC, IRON, RETICCTPCT in the last 72 hours. Urine analysis:     Component Value Date/Time   COLORURINE YELLOW 11/10/2015 1521   APPEARANCEUR CLEAR 11/10/2015 1521   LABSPEC 1.013 11/10/2015 1521   PHURINE 5.5 11/10/2015 1521   GLUCOSEU NEGATIVE 11/10/2015 1521   HGBUR NEGATIVE 11/10/2015 1521   BILIRUBINUR NEGATIVE 11/10/2015 1521   KETONESUR NEGATIVE 11/10/2015 1521   PROTEINUR 100* 11/10/2015 1521   UROBILINOGEN 0.2 03/02/2014 1508   NITRITE NEGATIVE 11/10/2015 1521   LEUKOCYTESUR TRACE* 11/10/2015 1521   Sepsis Labs: @LABRCNTIP (procalcitonin:4,lacticidven:4)   )No results found for this or any previous visit (from the past 240 hour(s)).    Radiology Studies: Dg Chest 2 View 11/10/2015 Mild CHF. Atelectasis or bronchopneumonia at the medial right base. Electronically Signed   By: Monte Fantasia M.D.   On: 11/10/2015 15:12    . amiodarone  200 mg Oral Daily  . amLODipine  10 mg Oral Daily  . atorvastatin  80 mg Oral Daily  . bisacodyl  10 mg Rectal Once  . bumetanide  2 mg Intravenous Q6H  . calcium acetate  1,334 mg Oral TID WC  . darbepoetin (ARANESP) injection - DIALYSIS  60 mcg Intravenous Once  . docusate sodium  100 mg Oral BID  . insulin aspart  0-15 Units Subcutaneous TID WC  . insulin aspart  0-5 Units Subcutaneous QHS  . insulin aspart protamine- aspart  15 Units Subcutaneous BID WC  . isosorbide-hydrALAZINE  2 tablet Oral TID  . loratadine  10 mg Oral Daily  . [START ON 11/21/2015] metolazone  5 mg Oral Daily  . metoprolol tartrate  25 mg Oral BID  . polyethylene glycol  17 g Oral BID  . potassium chloride  40 mEq Oral BID  . protein supplement  1 scoop Oral TID WC  . sodium chloride flush  3 mL Intravenous Q12H  . warfarin  7.5 mg Oral ONCE-1800  . Warfarin - Pharmacist Dosing Inpatient   Does not apply q1800     Continuous Infusions:    LOS: 10 days    Time spent: 25 minutes  Greater than 50% of the time spent on counseling and coordinating the care.   Leisa Lenz, MD [facility] Pager  [phone]  If 7PM-7AM, please contact night-coverage [email or web address] Password [facility] 11/20/2015, 11:07 AM

## 2015-11-21 ENCOUNTER — Inpatient Hospital Stay (HOSPITAL_COMMUNITY): Payer: Medicare Other

## 2015-11-21 LAB — PROTIME-INR
INR: 1.72 — ABNORMAL HIGH (ref 0.00–1.49)
Prothrombin Time: 20.1 seconds — ABNORMAL HIGH (ref 11.6–15.2)

## 2015-11-21 LAB — BASIC METABOLIC PANEL
ANION GAP: 11 (ref 5–15)
BUN: 140 mg/dL — AB (ref 6–20)
CALCIUM: 9 mg/dL (ref 8.9–10.3)
CO2: 28 mmol/L (ref 22–32)
Chloride: 98 mmol/L — ABNORMAL LOW (ref 101–111)
Creatinine, Ser: 3.42 mg/dL — ABNORMAL HIGH (ref 0.44–1.00)
GFR calc Af Amer: 15 mL/min — ABNORMAL LOW (ref >60)
GFR, EST NON AFRICAN AMERICAN: 13 mL/min — AB (ref >60)
Glucose, Bld: 185 mg/dL — ABNORMAL HIGH (ref 65–99)
POTASSIUM: 4.4 mmol/L (ref 3.5–5.1)
SODIUM: 137 mmol/L (ref 135–145)

## 2015-11-21 LAB — CBC
HEMATOCRIT: 25.3 % — AB (ref 36.0–46.0)
Hemoglobin: 7.8 g/dL — ABNORMAL LOW (ref 12.0–15.0)
MCH: 27 pg (ref 26.0–34.0)
MCHC: 30.8 g/dL (ref 30.0–36.0)
MCV: 87.5 fL (ref 78.0–100.0)
Platelets: 236 10*3/uL (ref 150–400)
RBC: 2.89 MIL/uL — ABNORMAL LOW (ref 3.87–5.11)
RDW: 16 % — AB (ref 11.5–15.5)
WBC: 7.3 10*3/uL (ref 4.0–10.5)

## 2015-11-21 LAB — RENAL FUNCTION PANEL
ANION GAP: 10 (ref 5–15)
Albumin: 3.2 g/dL — ABNORMAL LOW (ref 3.5–5.0)
BUN: 147 mg/dL — ABNORMAL HIGH (ref 6–20)
CALCIUM: 9.1 mg/dL (ref 8.9–10.3)
CHLORIDE: 99 mmol/L — AB (ref 101–111)
CO2: 29 mmol/L (ref 22–32)
CREATININE: 3.38 mg/dL — AB (ref 0.44–1.00)
GFR, EST AFRICAN AMERICAN: 15 mL/min — AB (ref >60)
GFR, EST NON AFRICAN AMERICAN: 13 mL/min — AB (ref >60)
Glucose, Bld: 180 mg/dL — ABNORMAL HIGH (ref 65–99)
Phosphorus: 4.3 mg/dL (ref 2.5–4.6)
Potassium: 4.8 mmol/L (ref 3.5–5.1)
Sodium: 138 mmol/L (ref 135–145)

## 2015-11-21 LAB — MRSA PCR SCREENING: MRSA by PCR: NEGATIVE

## 2015-11-21 LAB — GLUCOSE, CAPILLARY
GLUCOSE-CAPILLARY: 270 mg/dL — AB (ref 65–99)
GLUCOSE-CAPILLARY: 201 mg/dL — AB (ref 65–99)
Glucose-Capillary: 177 mg/dL — ABNORMAL HIGH (ref 65–99)
Glucose-Capillary: 151 mg/dL — ABNORMAL HIGH (ref 65–99)

## 2015-11-21 LAB — FERRITIN: FERRITIN: 188 ng/mL (ref 11–307)

## 2015-11-21 LAB — IRON AND TIBC
Iron: 42 ug/dL (ref 28–170)
Saturation Ratios: 14 % (ref 10.4–31.8)
TIBC: 300 ug/dL (ref 250–450)
UIBC: 258 ug/dL

## 2015-11-21 MED ORDER — PRISMASOL BGK 4/2.5 32-4-2.5 MEQ/L IV SOLN
INTRAVENOUS | Status: DC
  Administered 2015-11-22 – 2015-11-24 (×6): via INTRAVENOUS_CENTRAL
  Filled 2015-11-21 (×10): qty 5000

## 2015-11-21 MED ORDER — PRISMASOL BGK 4/2.5 32-4-2.5 MEQ/L IV SOLN
INTRAVENOUS | Status: DC
  Administered 2015-11-22 – 2015-11-24 (×4): via INTRAVENOUS_CENTRAL
  Filled 2015-11-21 (×6): qty 5000

## 2015-11-21 MED ORDER — WARFARIN SODIUM 7.5 MG PO TABS
7.5000 mg | ORAL_TABLET | Freq: Once | ORAL | Status: AC
  Administered 2015-11-21: 7.5 mg via ORAL
  Filled 2015-11-21: qty 1

## 2015-11-21 MED ORDER — PRISMASOL BGK 4/2.5 32-4-2.5 MEQ/L IV SOLN
INTRAVENOUS | Status: DC
  Administered 2015-11-22 – 2015-11-24 (×11): via INTRAVENOUS_CENTRAL
  Filled 2015-11-21 (×19): qty 5000

## 2015-11-21 MED ORDER — HEPARIN SODIUM (PORCINE) 1000 UNIT/ML DIALYSIS
1000.0000 [IU] | INTRAMUSCULAR | Status: DC | PRN
  Administered 2015-11-21: 3000 [IU] via INTRAVENOUS_CENTRAL
  Administered 2015-11-24: 1000 [IU] via INTRAVENOUS_CENTRAL
  Filled 2015-11-21: qty 1
  Filled 2015-11-21: qty 3
  Filled 2015-11-21: qty 1
  Filled 2015-11-21 (×3): qty 6

## 2015-11-21 MED ORDER — HEPARIN (PORCINE) 2000 UNITS/L FOR CRRT
INTRAVENOUS_CENTRAL | Status: DC | PRN
  Filled 2015-11-21 (×2): qty 1000

## 2015-11-21 NOTE — Progress Notes (Signed)
Report called and given to ICU Nurse Melody Neta Mends RN 12:52 PM 11-21-2015

## 2015-11-21 NOTE — Progress Notes (Signed)
Hemodialysis Catheter Insertion Procedure Note Briana [name] [id number] April 26, 1946  Procedure: Insertion of Hemodialysis Catheter Indications: Dialysis Access   Procedure Details Consent: Risks of procedure as well as the alternatives and risks of each were explained to the (patient/caregiver).  Consent for procedure obtained. Time Out: Verified patient identification, verified procedure, site/side was marked, verified correct patient position, special equipment/implants available, medications/allergies/relevent history reviewed, required imaging and test results available.  Performed  Maximum sterile technique was used including antiseptics, cap, gloves, gown, hand hygiene, mask and sheet. Skin prep: Chlorhexidine; local anesthetic administered Triple lumen hemodialysis catheter was inserted into right internal jugular vein using the Seldinger technique with real time ultrasound guidance.  Guidewire was gently advanced meeting no resistance and the needle was then removed. A #11 blade scalpel was used to open the skin incision, followed by a 10Fr and 12Fr dilator with care to ensure that the dilators were sliding smoothly over the wire. Then the catheter was inserted over the guidewire, guidewire removed with good flush and return through all ports. Filled with saline flush.  The wingtips were then secured loosely to the skin with sutures.  Evaluation Complications: No apparent complications Patient did tolerate procedure well. CXR requested.    [name], MD 11/21/2015

## 2015-11-21 NOTE — Progress Notes (Signed)
I have notified CCM around 1530, Dr. Titus Mould, of ccm consult, and pt needing HD cath.  Dr. Titus Mould was unaware of consult at that time even though I had specified to Dr. Bary Leriche that CCM needed to be paged. Dr. Titus Mould states, "very busy at this time.  Will be later."  Dr. Bary Leriche aware.

## 2015-11-21 NOTE — Progress Notes (Addendum)
Patient ID: [name], female   DOB: [birth year], 70 y.o.   MRN: [id number]  PROGRESS NOTE    KEMYAH PORRAS  [id number] DOB: [birth year] DOA: 11/10/2015  PCP: [name]   Brief Narrative:  70 y.o. female with past medical history significant for chronic diastolic CHF (in [id number] with EF 55%), hypertension, dyslipidemia, atrial fibrillation on anticoagulation with Coumadin, recently hospitalized 5/6-5/24 of 2017 for pneumonia which was then complicated further by A.Fib RVR which was unstable and required cardioversion. She also had NSTEMI which was managed medically and no cardiac cath due to acute kidney failure. She was discharged to inpatient rehab.   Patient presented to [facility] with worsening shortness of breath over past 2 weeks prior to this admission, or extremity edema. She was seen by primary care physician the week prior to the admission and was instructed to take Lasix twice daily whereas before she was on 40 mg once a day Lasix. There was no significant symptomatic relief for which reason she presented to ED for further evaluation.   Hospital course complicated with worsening renal function and fluid overload for which nephrology was consulted.    Assessment & Plan:   Acute on chronic diastolic CHF  - Weight since 6/23: 109.7 kg --> 109.2 kg --> 108.8 kg --> 109.3 kg --> 108.1 kg  - Cardio stopped lasix and switched to bumetanide 1 mg IV BID 6/25 which was further increased to 2 mg IV Q 6 hours 6/25 - She is still quite volume overloaded and per nephrology and cardiology likely to require ND - Continue metolazone - Continue daily weight and strict intake and output - Supplement electrolytes as needed  CKD stage 4 / Hyperkalemia  - Creatinine 3 weeks ago 2.75 - Cr on this admission up at 3.56 but seemed to have reached plateau and coming down, 3.42 this am - She was on IV lasix which was switched to bumetanide 6/25 - Continue phoslo -  Potassium normalized  - Renal following - Pt may need HD eventually to optimize volume status   Anemia of chronic kidney disease stage 4 - Hemoglobin 7.8  Atrial fibrillation  - CHADS vasc score 5 - Recent TEE 09/2015 done for cardioversion - Continue amiodarone 200 mg daily and metoprolol 25 mg twice daily.  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 - Continue normal lobe 70/30 mix 15 units twice daily - Continue sliding-scale insulin - CBGs in past 24 hours: 221, 159, 177  Dyslipidemia associated with type 2 diabetes mellitus - Continue Lipitor 80 mg at bedtime - Continue daily aspirin   Essential hypertension - Continue Norvasc 10 mg daily, Bidil TID, metoprolol 25 mg PO BID    DVT prophylaxis: Continue dose anticoagulation with coumadin  Code Status: full code  Family Communication: family not at the bedside this am, called her daughter over the phone to give an update 6/26 Disposition Plan: Home once volume status optimized    Consultants:   Cardiology   Nephrology   Procedures:   Vein mapping 11/16/15   Antimicrobials:   None    Subjective: No overnight events. No respiratory distress.   Objective: Filed Vitals:   11/20/15 0437 11/20/15 1141 11/20/15 2200 11/21/15 0526  BP: 174/62 145/48 167/41 164/42  Pulse: 50 46 53 48  Temp: 98.2 F (36.8 C) 98.4 F (36.9 C) 99 F (37.2 C) 98.5 F (36.9 C)  TempSrc: Oral Oral  Oral Oral  Resp: 20 20 20 20   Height:      Weight: 109.317 kg (241 lb)   108.138 kg (238 lb 6.4 oz)  SpO2: 95% 95% 99% 96%    Intake/Output Summary (Last 24 hours) at 11/21/15 0636 Last data filed at 11/21/15 0216  Gross per 24 hour  Intake    970 ml  Output   3000 ml  Net  -2030 ml   Filed Weights   11/19/15 0653 11/20/15 0437 11/21/15 0526  Weight: 108.818 kg (239 lb 14.4 oz) 109.317 kg (241 lb) 108.138 kg (238 lb 6.4 oz)    Examination:  General  exam:calm and comfortable  Respiratory system: bilateral air entry, no wheezing  Cardiovascular system: S1 & S2 (+), RRRR Gastrointestinal system: (+) BS, obese but not tender  Central nervous system: Nonfocal  Extremities:  +3 LE pitting edema, no tenderness  Skin: no ulcers or lesions  Psychiatry: Normal mood and behavior   Data Reviewed: I have personally reviewed following labs and imaging studies  CBC:  Recent Labs Lab 11/15/15 0414 11/16/15 1108 11/18/15 0854 11/19/15 0431 11/21/15 0518  WBC 8.0 8.6 7.5 7.7 7.3  HGB 7.9* 7.8* 7.9* 7.3* 7.8*  HCT 26.1* 26.1* 25.5* 23.8* 25.3*  MCV 89.7 89.1 87.9 88.1 87.5  PLT 228 215 207 200 [id number]   Basic Metabolic Panel:  Recent Labs Lab 11/17/15 0218 11/18/15 0854 11/19/15 0431 11/20/15 1321 11/21/15 0518  NA 136 134* 137 138 137  K 4.0 4.0 4.3 4.6 4.4  CL 100* 95* 98* 98* 98*  CO2 29 25 27 28 28   GLUCOSE 160* 212* 161* 248* 185*  BUN 103* 108* 131* 137* 140*  CREATININE 3.12* 2.98* 3.29* 3.56* 3.42*  CALCIUM 8.2* 8.3* 8.7* 9.1 9.0   GFR: Estimated Creatinine Clearance: 18.4 mL/min (by C-G formula based on Cr of 3.42). Liver Function Tests: No results for input(s): AST, ALT, ALKPHOS, BILITOT, PROT, ALBUMIN in the last 168 hours. No results for input(s): LIPASE, AMYLASE in the last 168 hours. No results for input(s): AMMONIA in the last 168 hours. Coagulation Profile:  Recent Labs Lab 11/17/15 0218 11/18/15 0452 11/19/15 0431 11/20/15 0542 11/21/15 0518  INR 2.54* 2.33* 1.99* 1.85* 1.72*   Cardiac Enzymes: No results for input(s): CKTOTAL, CKMB, CKMBINDEX, TROPONINI in the last 168 hours. BNP (last 3 results) No results for input(s): PROBNP in the last 8760 hours. HbA1C: No results for input(s): HGBA1C in the last 72 hours. CBG:  Recent Labs Lab 11/20/15 0632 11/20/15 1220 11/20/15 1621 11/20/15 2125 11/21/15 0629  GLUCAP 156* 223* 221* 159* 177*   Lipid Profile: No results for input(s): CHOL, HDL,  LDLCALC, TRIG, CHOLHDL, LDLDIRECT in the last 72 hours. Thyroid Function Tests: No results for input(s): TSH, T4TOTAL, FREET4, T3FREE, THYROIDAB in the last 72 hours. Anemia Panel: No results for input(s): VITAMINB12, FOLATE, FERRITIN, TIBC, IRON, RETICCTPCT in the last 72 hours. Urine analysis:    Component Value Date/Time   COLORURINE YELLOW 11/10/2015 1521   APPEARANCEUR CLEAR 11/10/2015 1521   LABSPEC 1.013 11/10/2015 1521   PHURINE 5.5 11/10/2015 1521   GLUCOSEU NEGATIVE 11/10/2015 1521   HGBUR NEGATIVE 11/10/2015 1521   BILIRUBINUR NEGATIVE 11/10/2015 1521   KETONESUR NEGATIVE 11/10/2015 1521   PROTEINUR 100* 11/10/2015 1521   UROBILINOGEN 0.2 03/02/2014 1508   NITRITE NEGATIVE 11/10/2015 1521   LEUKOCYTESUR TRACE* 11/10/2015 1521   Sepsis Labs: @LABRCNTIP (procalcitonin:4,lacticidven:4)   )No results found for this or any previous visit (from the past 240 hour(s)).  Radiology Studies: Dg Chest 2 View 11/10/2015 Mild CHF. Atelectasis or bronchopneumonia at the medial right base. Electronically Signed   By: Monte Fantasia M.D.   On: 11/10/2015 15:12    . amiodarone  200 mg Oral Daily  . amLODipine  10 mg Oral Daily  . atorvastatin  80 mg Oral Daily  . bisacodyl  10 mg Rectal Once  . bumetanide  2 mg Intravenous Q6H  . calcium acetate  1,334 mg Oral TID WC  . docusate sodium  100 mg Oral BID  . insulin aspart  0-15 Units Subcutaneous TID WC  . insulin aspart  0-5 Units Subcutaneous QHS  . insulin aspart protamine- aspart  15 Units Subcutaneous BID WC  . isosorbide-hydrALAZINE  2 tablet Oral TID  . loratadine  10 mg Oral Daily  . metolazone  5 mg Oral Daily  . metoprolol tartrate  25 mg Oral BID  . polyethylene glycol  17 g Oral BID  . potassium chloride  40 mEq Oral BID  . protein supplement  1 scoop Oral TID WC  . sodium chloride flush  3 mL Intravenous Q12H  . Warfarin - Pharmacist Dosing Inpatient   Does not apply q1800     Continuous Infusions:     LOS: 11 days    Time spent: 15 minutes  Greater than 50% of the time spent on counseling and coordinating the care.   Leisa Lenz, MD [facility] Pager [phone]  If 7PM-7AM, please contact night-coverage [email or web address] Password [facility] 11/21/2015, 6:36 AM

## 2015-11-21 NOTE — Progress Notes (Signed)
[facility] Progress Note    Assessment/ Plan:   1 CKD 3/4 - repeat labs in the am. - renal function still slowly declining which is not unexpected with diuresis.  2 Volume overload/CHF - Continue diuresis to optimize CO and RBF - Increased the Metolazone to 5mg  daily on 6/26 with good response --> UOP increased but with declining renal function. - Already on fluid restriction - She is massively volume overloaded --> d/w cardiology and I agree that the massive amount of excessive fluid will be difficult to manage medically without ultrafiltration from dialysis. - Recommend moving to the ICU ([place]) and let's start CRRT to help manage the volume status. Oral diuretics @ home would be of questionable efficacy given certain gut edema.  3 Nonoliguric AKI, hemodynamically mediated  Subjective:   She doesn't feel much improvement from time of hospitalization. Denies fever, chills, had a nonproductive cough a few days ago.  Good UOP but renal function continues to slowly decline.   Objective:   BP 164/42 mmHg  Pulse 48  Temp(Src) 98.5 F (36.9 C) (Oral)  Resp 20  Ht 5\' 4"  (1.626 m)  Wt 108.138 kg (238 lb 6.4 oz)  BMI 40.90 kg/m2  SpO2 96%  Intake/Output Summary (Last 24 hours) at 11/21/15 1051 Last data filed at 11/21/15 1004  Gross per 24 hour  Intake   1440 ml  Output   3750 ml  Net  -2310 ml   Weight change: -1.179 kg (-2 lb 9.6 oz)  Physical Exam: General appearance: alert and cooperative Back: presacral edema is present Resp: diminished breath sounds bibasilar rales to mid upper back Cardio: regular rate and rhythm, S1, S2 normal, no murmur, click, rub or gallop Extremities: edema 3+  Imaging: No results found.  Labs: BMET  Recent Labs Lab 11/15/15 0414 11/16/15 1108 11/17/15 0218 11/18/15 0854 11/19/15 0431 11/20/15 1321 11/21/15 0518  NA 139 134* 136 134* 137 138 137  K 5.2* 5.0 4.0 4.0 4.3 4.6 4.4  CL 104 99* 100* 95* 98* 98* 98*   CO2 28 24 29 25 27 28 28   GLUCOSE 123* 226* 160* 212* 161* 248* 185*  BUN 88* 94* 103* 108* 131* 137* 140*  CREATININE 3.03* 2.90* 3.12* 2.98* 3.29* 3.56* 3.42*  CALCIUM 8.5* 8.3* 8.2* 8.3* 8.7* 9.1 9.0   CBC  Recent Labs Lab 11/16/15 1108 11/18/15 0854 11/19/15 0431 11/21/15 0518  WBC 8.6 7.5 7.7 7.3  HGB 7.8* 7.9* 7.3* 7.8*  HCT 26.1* 25.5* 23.8* 25.3*  MCV 89.1 87.9 88.1 87.5  PLT 215 207 200 236    Medications:    . amiodarone  200 mg Oral Daily  . amLODipine  10 mg Oral Daily  . atorvastatin  80 mg Oral Daily  . bisacodyl  10 mg Rectal Once  . bumetanide  2 mg Intravenous Q6H  . calcium acetate  1,334 mg Oral TID WC  . docusate sodium  100 mg Oral BID  . insulin aspart  0-15 Units Subcutaneous TID WC  . insulin aspart  0-5 Units Subcutaneous QHS  . insulin aspart protamine- aspart  15 Units Subcutaneous BID WC  . isosorbide-hydrALAZINE  2 tablet Oral TID  . loratadine  10 mg Oral Daily  . metolazone  5 mg Oral Daily  . metoprolol tartrate  25 mg Oral BID  . polyethylene glycol  17 g Oral BID  . potassium chloride  40 mEq Oral BID  . protein supplement  1 scoop Oral TID WC  .  sodium chloride flush  3 mL Intravenous Q12H  . Warfarin - Pharmacist Dosing Inpatient   Does not apply q1800      Otelia Santee, MD 11/21/2015, 10:51 AM

## 2015-11-21 NOTE — Progress Notes (Signed)
Inpatient Diabetes Program Recommendations  AACE/ADA: New Consensus Statement on Inpatient Glycemic Control (2015)  Target Ranges:  Prepandial:   less than 140 mg/dL      Peak postprandial:   less than 180 mg/dL (1-2 hours)      Critically ill patients:  140 - 180 mg/dL  Results for Briana [name], Briana [name] (MRN [id number]) as of 11/21/2015 09:52  Ref. Range 11/20/2015 06:32 11/20/2015 12:20 11/20/2015 16:21 11/20/2015 21:25 11/21/2015 06:29  Glucose-Capillary Latest Ref Range: 65-99 mg/dL 156 (H) 223 (H) 221 (H) 159 (H) 177 (H)    Review of Glycemic Control  Current orders for Inpatient glycemic control: 70/30 15 units BID, Novolog 0-15 units TID with meals, Novolog 0-5 units QHS  Inpatient Diabetes Program Recommendations: Insulin - Basal: In reveiwing chart, noted morning dose of 70/30 was not given yesterday. As a result CBGs in the afternoon were more elevated. Do not recommend any changes with 70/30 at this time. NURSING, please encourage patient to take insulin as MD has ordered to improve inpatient glycemic control.  Thanks, Barnie Alderman, RN, MSN, CDE Diabetes Coordinator Inpatient Diabetes Program [phone] (Team Pager from [place] to [place]) [phone] ([facility] office) [phone] [facility] office) [phone] [facility] office)

## 2015-11-21 NOTE — Progress Notes (Addendum)
Anticoagulation/Dose adjustment CONSULT NOTE  Pharmacy Consult for warfarin/CRRT dosing Indication: atrial fibrillation  Allergies  Allergen Reactions  . Carbamazepine Other (See Comments)    Reaction to tegretol - loopy  . Clonazepam Other (See Comments)    Unknown reaction  . Gemfibrozil Other (See Comments)    Patient is not aware of this allergy but states that she has side effects to a cholesterol medication in the past  . Macrobid [Nitrofurantoin] Nausea And Vomiting  . Oxycodone Other (See Comments)    hallucinations    Patient Measurements: Height: 5\' 4"  (162.6 cm) Weight: 238 lb 6.4 oz (108.138 kg) (scale 48) IBW/kg (Calculated) : 54.7  Vital Signs: Temp: 98.5 F (36.9 C) (06/27 0526) Temp Source: Oral (06/27 0526) BP: 164/42 mmHg (06/27 0526) Pulse Rate: 48 (06/27 0526)  Labs:  Recent Labs  11/19/15 0431 11/20/15 0542 11/20/15 1321 11/21/15 0518  HGB 7.3*  --   --  7.8*  HCT 23.8*  --   --  25.3*  PLT 200  --   --  236  LABPROT 22.5* 21.3*  --  20.1*  INR 1.99* 1.85*  --  1.72*  CREATININE 3.29*  --  3.56* 3.42*    Estimated Creatinine Clearance: 18.4 mL/min (by C-G formula based on Cr of 3.42).  Assessment: 70 y.o. female who presents for evaluation of shortness of breath, orthopnea, and peripheral edema. On warfarin for Hx Afib. Pharmacy consulted to dose warfarin.  1. Warfarin: *PTA dose: 5mg  daily except 2.5mg  on Tues/Thurs (last PTA dose 6/15) INR on admit 2.55 INR today 1.72, remains subtherapeutic. Hgb to 7.8 today, no bleeding noted. PO intake is good at 75-100%. On amiodarone 200 mg daily Will give another warfarin 7.5 mg dose and f/u INR in the morning.  Goal of Therapy:  INR 2-3 Monitor platelets by anticoagulation protocol: Yes   Plan:  1. Warfarin 7.5 mg x 1 tonight 2. Daily INR, CBC q72h, Monitor s/sx of bleeding 3. Plan is to start CRRT. Current medications do not require adjustment for CRRT, pharmacy will continue to  follow.   Thank you for allowing [place] to participate in this patients care. Jens Som, PharmD Pager: [phone]  11/21/2015 11:49 AM

## 2015-11-21 NOTE — Clinical Social Work Note (Signed)
Patient transferred from [facility] to [facility]. Handoff information provided to [place] CSW. Plan is still for [facility] in [place].  This CSW signing off.  Dayton Scrape, [place]

## 2015-11-21 NOTE — Progress Notes (Signed)
    Subjective:  Feels a little better. Notes less swelling in her legs, but still very dyspneic with activity  Objective:  Vital Signs in the last 24 hours: Temp:  [98.4 F (36.9 C)-99 F (37.2 C)] 98.5 F (36.9 C) (06/27 0526) Pulse Rate:  [46-53] 48 (06/27 0526) Resp:  [20] 20 (06/27 0526) BP: (145-167)/(41-48) 164/42 mmHg (06/27 0526) SpO2:  [95 %-99 %] 96 % (06/27 0526) Weight:  [238 lb 6.4 oz (108.138 kg)] 238 lb 6.4 oz (108.138 kg) (06/27 0526)  Intake/Output from previous day: 06/26 0701 - 06/27 0700 In: 1200 [P.O.:1200] Out: 3550 [Urine:3550]  Physical Exam: Pt is alert and oriented, NAD HEENT: normal Neck: JVP - elevated Lungs: rales in bases bilaterally CV: RRR with 2/6 systolic murmur at the RUSB Abd: soft, NT, Positive BS, no hepatomegaly Ext: 2+ diffuse leg edema bilaterally, distal pulses intact and equal Skin: warm/dry no rash   Lab Results:  Recent Labs  11/19/15 0431 11/21/15 0518  WBC 7.7 7.3  HGB 7.3* 7.8*  PLT 200 236    Recent Labs  11/20/15 1321 11/21/15 0518  NA 138 137  K 4.6 4.4  CL 98* 98*  CO2 28 28  GLUCOSE 248* 185*  BUN 137* 140*  CREATININE 3.56* 3.42*   No results for input(s): TROPONINI in the last 72 hours.  Invalid input(s): CK, MB  Assessment/Plan:  1. Acute on chronic diastolic heart failure 2. Stage V chronic kidney disease, progressive 3. Type 2 diabetes  Reviewed data again today. The patient's BUN is 140. She was diuresed with 2 L negative over the last 24 hours on high-dose diuretics. She remains markedly volume overloaded. Discussed with Dr. Augustin Coupe with the nephrology team. I agree that the patient would benefit from ultrafiltration for treatment of her volume overloaded as it is unlikely that we will be able to make enough progress with high-dose diuretics to treat her heart failure. He recommends moving the patient to the ICU so that this can be started.   Sherren Mocha, M.D. 11/21/2015, 10:43  AM

## 2015-11-22 LAB — GLUCOSE, CAPILLARY
GLUCOSE-CAPILLARY: 156 mg/dL — AB (ref 65–99)
GLUCOSE-CAPILLARY: 273 mg/dL — AB (ref 65–99)
Glucose-Capillary: 109 mg/dL — ABNORMAL HIGH (ref 65–99)
Glucose-Capillary: 247 mg/dL — ABNORMAL HIGH (ref 65–99)

## 2015-11-22 LAB — CBC
HCT: 25.1 % — ABNORMAL LOW (ref 36.0–46.0)
Hemoglobin: 7.6 g/dL — ABNORMAL LOW (ref 12.0–15.0)
MCH: 26.6 pg (ref 26.0–34.0)
MCHC: 30.3 g/dL (ref 30.0–36.0)
MCV: 87.8 fL (ref 78.0–100.0)
Platelets: 227 10*3/uL (ref 150–400)
RBC: 2.86 MIL/uL — ABNORMAL LOW (ref 3.87–5.11)
RDW: 16.1 % — ABNORMAL HIGH (ref 11.5–15.5)
WBC: 6.9 10*3/uL (ref 4.0–10.5)

## 2015-11-22 LAB — RENAL FUNCTION PANEL
Albumin: 3 g/dL — ABNORMAL LOW (ref 3.5–5.0)
Albumin: 3 g/dL — ABNORMAL LOW (ref 3.5–5.0)
Anion gap: 7 (ref 5–15)
Anion gap: 6 (ref 5–15)
BUN: 113 mg/dL — ABNORMAL HIGH (ref 6–20)
BUN: 76 mg/dL — ABNORMAL HIGH (ref 6–20)
CO2: 31 mmol/L (ref 22–32)
CO2: 28 mmol/L (ref 22–32)
Calcium: 8.8 mg/dL — ABNORMAL LOW (ref 8.9–10.3)
Calcium: 8.7 mg/dL — ABNORMAL LOW (ref 8.9–10.3)
Chloride: 102 mmol/L (ref 101–111)
Chloride: 104 mmol/L (ref 101–111)
Creatinine, Ser: 2.69 mg/dL — ABNORMAL HIGH (ref 0.44–1.00)
Creatinine, Ser: 2.02 mg/dL — ABNORMAL HIGH (ref 0.44–1.00)
GFR calc Af Amer: 20 mL/min — ABNORMAL LOW (ref >60)
GFR calc Af Amer: 28 mL/min — ABNORMAL LOW (ref >60)
GFR calc non Af Amer: 17 mL/min — ABNORMAL LOW (ref >60)
GFR calc non Af Amer: 24 mL/min — ABNORMAL LOW (ref >60)
Glucose, Bld: 99 mg/dL (ref 65–99)
Glucose, Bld: 239 mg/dL — ABNORMAL HIGH (ref 65–99)
Phosphorus: 3.7 mg/dL (ref 2.5–4.6)
Phosphorus: 2.9 mg/dL (ref 2.5–4.6)
Potassium: 4.4 mmol/L (ref 3.5–5.1)
Potassium: 4.8 mmol/L (ref 3.5–5.1)
Sodium: 140 mmol/L (ref 135–145)
Sodium: 138 mmol/L (ref 135–145)

## 2015-11-22 LAB — PROTIME-INR
INR: 1.82 — ABNORMAL HIGH (ref 0.00–1.49)
PROTHROMBIN TIME: 21 s — AB (ref 11.6–15.2)

## 2015-11-22 LAB — MAGNESIUM: Magnesium: 2.4 mg/dL (ref 1.7–2.4)

## 2015-11-22 MED ORDER — WARFARIN SODIUM 7.5 MG PO TABS
7.5000 mg | ORAL_TABLET | Freq: Once | ORAL | Status: AC
  Administered 2015-11-22: 7.5 mg via ORAL
  Filled 2015-11-22: qty 1

## 2015-11-22 MED ORDER — SODIUM CHLORIDE 0.9% FLUSH
10.0000 mL | INTRAVENOUS | Status: DC | PRN
Start: 2015-11-22 — End: 2015-12-10
  Administered 2015-11-24 – 2015-12-07 (×3): 10 mL
  Filled 2015-11-22 (×3): qty 40

## 2015-11-22 MED ORDER — CETYLPYRIDINIUM CHLORIDE 0.05 % MT LIQD
7.0000 mL | Freq: Two times a day (BID) | OROMUCOSAL | Status: DC
  Administered 2015-11-23 – 2015-11-24 (×4): 7 mL via OROMUCOSAL

## 2015-11-22 MED ORDER — DIAZEPAM 2 MG PO TABS
2.0000 mg | ORAL_TABLET | Freq: Four times a day (QID) | ORAL | Status: DC | PRN
  Administered 2015-11-22 – 2015-12-09 (×29): 2 mg via ORAL
  Filled 2015-11-22 (×30): qty 1

## 2015-11-22 MED ORDER — METOPROLOL TARTRATE 12.5 MG HALF TABLET: 12.5000 mg | ORAL_TABLET | Freq: Two times a day (BID) | ORAL | Status: DC

## 2015-11-22 MED ORDER — SODIUM CHLORIDE 0.9% FLUSH
10.0000 mL | Freq: Two times a day (BID) | INTRAVENOUS | Status: DC
  Administered 2015-11-22 – 2015-11-28 (×10): 10 mL
  Administered 2015-12-08: 30 mL

## 2015-11-22 NOTE — Care Management Important Message (Signed)
Important Message  Patient Details  Name: Briana [name] MRN: [id number] Date of Birth: Dec 27, 1945   Medicare Important Message Given:  Yes    Loann Quill 11/22/2015, 9:53 AM

## 2015-11-22 NOTE — Progress Notes (Signed)
PROGRESS NOTE  Briana [name] [id number] DOB: 1946/01/26 DOA: 11/10/2015 PCP: Antionette Fairy, PA-C   LOS: 12 days   Brief Narrative: 70 y.o. female with past medical history significant for chronic diastolic CHF (in [id number] TEE with EF 55%), hypertension, dyslipidemia, atrial fibrillation on anticoagulation with Coumadin, recently hospitalized 5/6-5/24 of 2017 for pneumonia which was then complicated further by A.Fib RVR which was unstable and required cardioversion. She also had NSTEMI which was managed medically and no cardiac cath due to acute kidney failure. She was discharged to inpatient rehab.   Patient presented to [facility] with worsening shortness of breath over past 2 weeks prior to this admission, or extremity edema. She was seen by primary care physician the week prior to the admission and was instructed to take Lasix twice daily whereas before she was on 40 mg once a day Lasix. There was no significant symptomatic relief for which reason she presented to ED for further evaluation.   Hospital course complicated with worsening renal function and fluid overload for which nephrology was consulted.   Assessment & Plan: Principal Problem:   Acute on chronic diastolic (congestive) heart failure (HCC) Active Problems:   Acute on chronic diastolic CHF (congestive heart failure) (HCC)   Controlled diabetes mellitus type 2 with complications (HCC)   CAD in native artery   Chronic kidney disease (CKD), stage IV (severe) (HCC)   Essential hypertension   Anemia of chronic kidney failure   Acute on chronic diastolic CHF  - Weight since 6/23: 109.7 kg --> 109.2 kg --> 108.8 kg --> 109.3 kg --> 108.1 kg >> 104.4 - HD started 6/28 - Continue metolazone - Continue daily weight and strict intake and output - Supplement electrolytes as needed  CKD stage 4 / Hyperkalemia  - Creatinine 3 weeks ago 2.75 - Renal following - HD started today   Anemia of chronic kidney disease  stage 4 - Hemoglobin 7.6  Atrial fibrillation  - CHADS vasc score 5 - Recent TEE 09/2015 done for cardioversion - Continue amiodarone 200 mg daily and metoprolol 25 mg twice daily.  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 - Continue 70/30 mix 15 units twice daily - Continue sliding-scale insulin - CBGs in past 24 hours: 99-156  Dyslipidemia associated with type 2 diabetes mellitus - Continue Lipitor 80 mg at bedtime - Continue daily aspirin   Essential hypertension - Continue Norvasc 10 mg daily, Bidil TID, metoprolol 25 mg PO BID   DVT prophylaxis: Coumadin Code Status: Full Family Communication: no family bedside Disposition Plan: remain in SDU  Consultants:   Nephrology  Cardiology  Procedures:   HD  Antimicrobials:  None    Subjective: - no chest pain, shortness of breath, no abdominal pain, nausea or vomiting.  - feeling overall better, feels a difference since HD started  Objective: Filed Vitals:   11/22/15 1000 11/22/15 1100 11/22/15 1116 11/22/15 1200  BP: 138/47 142/41 142/41 105/90  Pulse: 49 49 54 54  Temp:   98.2 F (36.8 C)   TempSrc:   Oral   Resp: 20 22 24 17   Height:      Weight:      SpO2: 95% 95% 95% 98%    Intake/Output Summary (Last 24 hours) at 11/22/15 1226 Last data filed at 11/22/15 1200  Gross per 24 hour  Intake    890 ml  Output   5183 ml  Net  -4293  ml   Filed Weights   11/20/15 0437 11/21/15 0526 11/22/15 0430  Weight: 109.317 kg (241 lb) 108.138 kg (238 lb 6.4 oz) 104.4 kg (230 lb 2.6 oz)    Examination: Constitutional: NAD Filed Vitals:   11/22/15 1000 11/22/15 1100 11/22/15 1116 11/22/15 1200  BP: 138/47 142/41 142/41 105/90  Pulse: 49 49 54 54  Temp:   98.2 F (36.8 C)   TempSrc:   Oral   Resp: 20 22 24 17   Height:      Weight:      SpO2: 95% 95% 95% 98%   Eyes: PERRL, lids and conjunctivae normal Respiratory: clear to  auscultation bilaterally, no wheezing, no crackles. Normal respiratory effort. No accessory muscle use.  Cardiovascular: Regular rate and rhythm, no murmurs / rubs / gallops. 2+ pitting LE edema. 2+ pedal pulses. No carotid bruits.  Abdomen: no tenderness. Bowel sounds positive.  Musculoskeletal: no clubbing / cyanosis.  Neurologic: CN 2-12 grossly intact. Strength 5/5 in all 4.   Data Reviewed: I have personally reviewed following labs and imaging studies  CBC:  Recent Labs Lab 11/16/15 1108 11/18/15 0854 11/19/15 0431 11/21/15 0518 11/22/15 0507  WBC 8.6 7.5 7.7 7.3 6.9  HGB 7.8* 7.9* 7.3* 7.8* 7.6*  HCT 26.1* 25.5* 23.8* 25.3* 25.1*  MCV 89.1 87.9 88.1 87.5 87.8  PLT 215 207 200 236 Q000111Q   Basic Metabolic Panel:  Recent Labs Lab 11/19/15 0431 11/20/15 1321 11/21/15 0518 11/21/15 1303 11/22/15 0507  NA 137 138 137 138 140  K 4.3 4.6 4.4 4.8 4.4  CL 98* 98* 98* 99* 102  CO2 27 28 28 29 31   GLUCOSE 161* 248* 185* 180* 99  BUN 131* 137* 140* 147* 113*  CREATININE 3.29* 3.56* 3.42* 3.38* 2.69*  CALCIUM 8.7* 9.1 9.0 9.1 8.8*  MG  --   --   --   --  2.4  PHOS  --   --   --  4.3 3.7   GFR: Estimated Creatinine Clearance: 22.9 mL/min (by C-G formula based on Cr of 2.69). Liver Function Tests:  Recent Labs Lab 11/21/15 1303 11/22/15 0507  ALBUMIN 3.2* 3.0*   Coagulation Profile:  Recent Labs Lab 11/18/15 0452 11/19/15 0431 11/20/15 0542 11/21/15 0518 11/22/15 0507  INR 2.33* 1.99* 1.85* 1.72* 1.82*   CBG:  Recent Labs Lab 11/21/15 1129 11/21/15 1602 11/21/15 2158 11/22/15 0737 11/22/15 1120  GLUCAP 151* 270* 201* 109* 156*   Anemia Panel:  Recent Labs  11/21/15 0518  FERRITIN 188  TIBC 300  IRON 42   Urine analysis:    Component Value Date/Time   COLORURINE YELLOW 11/10/2015 1521   APPEARANCEUR CLEAR 11/10/2015 1521   LABSPEC 1.013 11/10/2015 1521   PHURINE 5.5 11/10/2015 1521   GLUCOSEU NEGATIVE 11/10/2015 1521   HGBUR NEGATIVE  11/10/2015 1521   BILIRUBINUR NEGATIVE 11/10/2015 1521   KETONESUR NEGATIVE 11/10/2015 1521   PROTEINUR 100* 11/10/2015 1521   UROBILINOGEN 0.2 03/02/2014 1508   NITRITE NEGATIVE 11/10/2015 1521   LEUKOCYTESUR TRACE* 11/10/2015 1521   Sepsis Labs: Invalid input(s): PROCALCITONIN, LACTICIDVEN  Recent Results (from the past 240 hour(s))  MRSA PCR Screening     Status: None   Collection Time: 11/21/15  2:16 PM  Result Value Ref Range Status   MRSA by PCR NEGATIVE NEGATIVE Final    Comment:        The GeneXpert MRSA Assay (FDA approved for NASAL specimens only), is one component of a comprehensive MRSA colonization surveillance program. It  is not intended to diagnose MRSA infection nor to guide or monitor treatment for MRSA infections.     Radiology Studies: Dg Chest Port 1 View  11/22/2015  CLINICAL DATA:  70 year old female with dialysis catheter placement EXAM: PORTABLE CHEST 1 VIEW COMPARISON:  Chest radiograph dated 11/10/2015 FINDINGS: There has been interval placement of a right IJ dialysis catheter with tip at the cavoatrial junction. There is cardiomegaly with mild vascular congestion. Bibasilar linear and platelike atelectasis/ scarring. Focal density at the right lung base likely represent atelectasis/scarring. Infiltrate is not excluded. Clinical correlation is recommended. Median sternotomy wires. No acute osseous pathology. IMPRESSION: Interval placement of a right IJ dialysis catheter with tip at the cavoatrial junction. No pneumothorax. Cardiomegaly with mild congestive changes. Bibasilar atelectatic changes. Infiltrate less likely. Clinical correlation is recommended. Electronically Signed   By: Anner Crete M.D.   On: 11/22/2015 00:26   Scheduled Meds: . amiodarone  200 mg Oral Daily  . amLODipine  10 mg Oral Daily  . antiseptic oral rinse  7 mL Mouth Rinse BID  . atorvastatin  80 mg Oral Daily  . bisacodyl  10 mg Rectal Once  . calcium acetate  1,334 mg Oral  TID WC  . docusate sodium  100 mg Oral BID  . insulin aspart  0-15 Units Subcutaneous TID WC  . insulin aspart  0-5 Units Subcutaneous QHS  . insulin aspart protamine- aspart  15 Units Subcutaneous BID WC  . isosorbide-hydrALAZINE  2 tablet Oral TID  . loratadine  10 mg Oral Daily  . metolazone  5 mg Oral Daily  . metoprolol tartrate  25 mg Oral BID  . polyethylene glycol  17 g Oral BID  . potassium chloride  40 mEq Oral BID  . protein supplement  1 scoop Oral TID WC  . sodium chloride flush  10-40 mL Intracatheter Q12H  . sodium chloride flush  3 mL Intravenous Q12H  . warfarin  7.5 mg Oral ONCE-1800  . Warfarin - Pharmacist Dosing Inpatient   Does not apply q1800   Continuous Infusions: . dialysis replacement fluid (prismasate) 500 mL/hr at 11/22/15 1004  . dialysis replacement fluid (prismasate) 300 mL/hr at 11/22/15 0012  . dialysate (PRISMASATE) 1,000 mL/hr at 11/22/15 1016    Marzetta Board, MD, PhD [facility] Pager [phone]  If 7PM-7AM, please contact night-coverage [email or web address] Password [facility] 11/22/2015, 12:26 PM

## 2015-11-22 NOTE — Progress Notes (Signed)
Anticoagulation/Dose adjustment CONSULT NOTE  Pharmacy Consult for warfarin Indication: atrial fibrillation  Allergies  Allergen Reactions  . Carbamazepine Other (See Comments)    Reaction to tegretol - loopy  . Clonazepam Other (See Comments)    Unknown reaction  . Gemfibrozil Other (See Comments)    Patient is not aware of this allergy but states that she has side effects to a cholesterol medication in the past  . Macrobid [Nitrofurantoin] Nausea And Vomiting  . Oxycodone Other (See Comments)    hallucinations    Patient Measurements: Height: 5\' 4"  (162.6 cm) Weight: 230 lb 2.6 oz (104.4 kg) IBW/kg (Calculated) : 54.7  Vital Signs: Temp: 98.4 F (36.9 C) (06/28 0739) Temp Source: Oral (06/28 0739) BP: 143/45 mmHg (06/28 0739) Pulse Rate: 49 (06/28 0739)  Labs:  Recent Labs  11/20/15 0542  11/21/15 0518 11/21/15 1303 11/22/15 0507  HGB  --   --  7.8*  --  7.6*  HCT  --   --  25.3*  --  25.1*  PLT  --   --  236  --  227  LABPROT 21.3*  --  20.1*  --  21.0*  INR 1.85*  --  1.72*  --  1.82*  CREATININE  --   < > 3.42* 3.38* 2.69*  < > = values in this interval not displayed.  Estimated Creatinine Clearance: 22.9 mL/min (by C-G formula based on Cr of 2.69).  Assessment: 70 y.o. female who presents for evaluation of shortness of breath, orthopnea, and peripheral edema.  She is noted with HF and significant volume overload. CRRT has been started for fluid removal. She is on warfarin for Hx Afib and Pharmacy consulted to dose -INR= 1.82 and below goal   Warfarin: *PTA dose: 5mg  daily except 2.5mg  on Tues/Thurs (last PTA dose 6/15) INR on admit 2.55  Goal of Therapy:  INR 2-3 Monitor platelets by anticoagulation protocol: Yes   Plan:  -Warfarin 7.5 mg x 1 tonight -Daily INR  Hildred Laser, Pharm D 11/22/2015 8:54 AM

## 2015-11-22 NOTE — Progress Notes (Signed)
Hospital Problem List     Principal Problem:   Acute on chronic diastolic (congestive) heart failure (HCC) Active Problems:   Acute on chronic diastolic CHF (congestive heart failure) (HCC)   Controlled diabetes mellitus type 2 with complications (HCC)   CAD in native artery   Chronic kidney disease (CKD), stage IV (severe) (HCC)   Essential hypertension   Anemia of chronic kidney failure    Patient Profile:   Primary Cardiologist: Dr. Bronson Ing  70 yo female w/ PMH of Type 2 DM, HTN, HLD, PVD, Stage 4 CKD, A-fib (on coumadin), CAD (s/p CABG 2005) and chronic diastolic CHF admitted [id number] w/ CHF exacerbation.   Subjective   Started on CRRT earlier today. Reports improvement in her breathing already.   Inpatient Medications    . amiodarone  200 mg Oral Daily  . amLODipine  10 mg Oral Daily  . antiseptic oral rinse  7 mL Mouth Rinse BID  . atorvastatin  80 mg Oral Daily  . bisacodyl  10 mg Rectal Once  . calcium acetate  1,334 mg Oral TID WC  . docusate sodium  100 mg Oral BID  . insulin aspart  0-15 Units Subcutaneous TID WC  . insulin aspart  0-5 Units Subcutaneous QHS  . insulin aspart protamine- aspart  15 Units Subcutaneous BID WC  . isosorbide-hydrALAZINE  2 tablet Oral TID  . loratadine  10 mg Oral Daily  . metolazone  5 mg Oral Daily  . metoprolol tartrate  25 mg Oral BID  . polyethylene glycol  17 g Oral BID  . potassium chloride  40 mEq Oral BID  . protein supplement  1 scoop Oral TID WC  . sodium chloride flush  10-40 mL Intracatheter Q12H  . sodium chloride flush  3 mL Intravenous Q12H  . warfarin  7.5 mg Oral ONCE-1800  . Warfarin - Pharmacist Dosing Inpatient   Does not apply q1800    Vital Signs    Filed Vitals:   11/22/15 1000 11/22/15 1100 11/22/15 1116 11/22/15 1200  BP: 138/47 142/41 142/41 105/90  Pulse: 49 49 54 54  Temp:   98.2 F (36.8 C)   TempSrc:   Oral   Resp: 20 22 24 17   Height:      Weight:      SpO2: 95% 95% 95% 98%     Intake/Output Summary (Last 24 hours) at 11/22/15 1214 Last data filed at 11/22/15 1200  Gross per 24 hour  Intake    890 ml  Output   5183 ml  Net  -4293 ml   Filed Weights   11/20/15 0437 11/21/15 0526 11/22/15 0430  Weight: 241 lb (109.317 kg) 238 lb 6.4 oz (108.138 kg) 230 lb 2.6 oz (104.4 kg)    Physical Exam    General: Well developed, well nourished, female appearing in no acute distress. Head: Normocephalic, atraumatic.  Neck: Supple without bruits, JVD elevated. Lungs:  Resp regular and unlabored, CTA without wheezing or rales. Heart: RRR, S1, S2, no S3, S4, 2/6 SEM at RUSB; no rub. Abdomen: Soft, non-tender, non-distended with normoactive bowel sounds. No hepatomegaly. No rebound/guarding. No obvious abdominal masses. Extremities: No clubbing or cyanosis,  2+ edema on right, 1+ on left. Distal pedal pulses are 2+ bilaterally. Neuro: Alert and oriented X 3. Moves all extremities spontaneously. Psych: Normal affect.  Labs    CBC  Recent Labs  11/21/15 0518 11/22/15 0507  WBC 7.3 6.9  HGB 7.8* 7.6*  HCT 25.3* 25.1*  MCV 87.5 87.8  PLT 236 Q000111Q   Basic Metabolic Panel  Recent Labs  11/21/15 1303 11/22/15 0507  NA 138 140  K 4.8 4.4  CL 99* 102  CO2 29 31  GLUCOSE 180* 99  BUN 147* 113*  CREATININE 3.38* 2.69*  CALCIUM 9.1 8.8*  MG  --  2.4  PHOS 4.3 3.7   Liver Function Tests  Recent Labs  11/21/15 1303 11/22/15 0507  ALBUMIN 3.2* 3.0*    Telemetry    Sinus bradycardia, HR in high-40's - 50's.    Cardiac Studies and Radiology    Dg Chest Port 1 View  11/22/2015  CLINICAL DATA:  70 year old female with dialysis catheter placement EXAM: PORTABLE CHEST 1 VIEW COMPARISON:  Chest radiograph dated 11/10/2015 FINDINGS: There has been interval placement of a right IJ dialysis catheter with tip at the cavoatrial junction. There is cardiomegaly with mild vascular congestion. Bibasilar linear and platelike atelectasis/ scarring. Focal density at  the right lung base likely represent atelectasis/scarring. Infiltrate is not excluded. Clinical correlation is recommended. Median sternotomy wires. No acute osseous pathology. IMPRESSION: Interval placement of a right IJ dialysis catheter with tip at the cavoatrial junction. No pneumothorax. Cardiomegaly with mild congestive changes. Bibasilar atelectatic changes. Infiltrate less likely. Clinical correlation is recommended. Electronically Signed   By: Anner Crete M.D.   On: 11/22/2015 00:26   Assessment & Plan    1. Acute on chronic diastolic CHF  - Echo in [id number] showing EF of 55-60% with Grade 3 DD. - was started on IV diuresis at admission with a net output of -19L but still appeared volume overloaded on examination and creatinine was continuing to increase. Started on CRRT on 11/22/2015. Net output -1.7L thus far today.  - continue with CRRT. Continue Amlodipine and BB.    2. Progressive Stage 5 CKD  - started on CRRT earlier today.  Creatinine at 2.69 this AM. - per Nephrology.   3. Paroxysmal Atrial Fibrillation  - maintaining NSR. Continue Amiodarone and Lopressor. She is mostly bradycardiac in the mid-40's - 50's. Lopressor dosing has been held today and yesterday. Will decrease dosing from 25mg  BID to 12.5mg  BID with hold parameters still in place. - This patients CHA2DS2-VASc Score and unadjusted Ischemic Stroke Rate (% per year) is equal to 7.2 % stroke rate/year from a score of 5 (HTN, CAD, CHF, Female, Age). Continue Coumadin per pharmacy dosing.  Signed, Erma Heritage , PA-C 12:14 PM 11/22/2015 Pager: [phone]  Patient seen, examined. Available data reviewed. Agree with findings, assessment, and plan as outlined by Bernerd Pho, PA-C. The patient is independently interviewed and examined. Her daughter is at the bedside. Lungs are clear anteriorly. Heart is regular rate and rhythm with a 2/6 systolic murmur at the left sternal border. Leg edema persists but is  improving. The legs are much softer. She seems to be tolerating CRRT well. Will continue current medications at this time. Appreciate care of the renal team.  Sherren Mocha, M.D. 11/22/2015 1:13 PM

## 2015-11-22 NOTE — Progress Notes (Signed)
[facility] Progress Note   Seen on CRRT @ 745am RIJ cath 4K bath Pre/Post 500/300 Net UF set at 356ml/hr which she is tolerating  Assessment/ Plan:   1 CKD 3/4 - repeat labs in the am. - renal function still slowly declining which is not unexpected with diuresis.  2 Volume overload/CHF - Continue diuresis to optimize CO and RBF - Increased the Metolazone to 5mg  daily on 6/26 with good response  - Because of declining renal function and massive volume overload we initiated CRRT @ 1AM 6/28 (D#1) --> CRRT will be much more physiologic and less likely to cause AKI c/w iHD. - Already on fluid restriction - Stopped the IV diuretics while on CRRT - Hopefully she will need CRRT only 3 days --> will assess daily - CXR reviewed and OK s/p line placement.  3 Nonoliguric AKI, hemodynamically mediated  Subjective:   She was initiated on CRRT @ about 1AM and feels that the legs are less tense.  Denies fever, chills, had a nonproductive cough a few days ago.  I&O [id number] = -[id number] For today --> net neg 301.    Objective:   BP 143/45 mmHg  Pulse 49  Temp(Src) 98.4 F (36.9 C) (Oral)  Resp 16  Ht 5\' 4"  (1.626 m)  Wt 104.4 kg (230 lb 2.6 oz)  BMI 39.49 kg/m2  SpO2 97%  Intake/Output Summary (Last 24 hours) at 11/22/15 [id number] Last data filed at 11/22/15 0800  Gross per 24 hour  Intake   1070 ml  Output   4687 ml  Net  -3617 ml   Weight change: -3.738 kg (-8 lb 3.8 oz)  Physical Exam: General appearance: alert and cooperative Back: presacral edema is present Resp: diminished breath sounds bibasilar rales to mid upper back Cardio: regular rate and rhythm, S1, S2 normal, no murmur, click, rub or gallop Extremities: edema 3+ Access: RIJ cath  Imaging: Dg Chest Port 1 View  11/22/2015  CLINICAL DATA:  70 year old female with dialysis catheter placement EXAM: PORTABLE CHEST 1 VIEW COMPARISON:  Chest radiograph dated 11/10/2015 FINDINGS: There has been interval  placement of a right IJ dialysis catheter with tip at the cavoatrial junction. There is cardiomegaly with mild vascular congestion. Bibasilar linear and platelike atelectasis/ scarring. Focal density at the right lung base likely represent atelectasis/scarring. Infiltrate is not excluded. Clinical correlation is recommended. Median sternotomy wires. No acute osseous pathology. IMPRESSION: Interval placement of a right IJ dialysis catheter with tip at the cavoatrial junction. No pneumothorax. Cardiomegaly with mild congestive changes. Bibasilar atelectatic changes. Infiltrate less likely. Clinical correlation is recommended. Electronically Signed   By: Anner Crete M.D.   On: 11/22/2015 00:26    Labs: BMET  Recent Labs Lab 11/17/15 0218 11/18/15 0854 11/19/15 0431 11/20/15 1321 11/21/15 0518 11/21/15 1303 11/22/15 0507  NA 136 134* 137 138 137 138 140  K 4.0 4.0 4.3 4.6 4.4 4.8 4.4  CL 100* 95* 98* 98* 98* 99* 102  CO2 29 25 27 28 28 29 31   GLUCOSE 160* 212* 161* 248* 185* 180* 99  BUN 103* 108* 131* 137* 140* 147* 113*  CREATININE 3.12* 2.98* 3.29* 3.56* 3.42* 3.38* 2.69*  CALCIUM 8.2* 8.3* 8.7* 9.1 9.0 9.1 8.8*  PHOS  --   --   --   --   --  4.3 3.7   CBC  Recent Labs Lab 11/18/15 0854 11/19/15 0431 11/21/15 0518 11/22/15 0507  WBC 7.5 7.7 7.3 6.9  HGB 7.9* 7.3* 7.8* 7.6*  HCT 25.5* 23.8* 25.3* 25.1*  MCV 87.9 88.1 87.5 87.8  PLT 207 200 236 227    Medications:    . amiodarone  200 mg Oral Daily  . amLODipine  10 mg Oral Daily  . atorvastatin  80 mg Oral Daily  . bisacodyl  10 mg Rectal Once  . calcium acetate  1,334 mg Oral TID WC  . docusate sodium  100 mg Oral BID  . insulin aspart  0-15 Units Subcutaneous TID WC  . insulin aspart  0-5 Units Subcutaneous QHS  . insulin aspart protamine- aspart  15 Units Subcutaneous BID WC  . isosorbide-hydrALAZINE  2 tablet Oral TID  . loratadine  10 mg Oral Daily  . metolazone  5 mg Oral Daily  . metoprolol tartrate  25  mg Oral BID  . polyethylene glycol  17 g Oral BID  . potassium chloride  40 mEq Oral BID  . protein supplement  1 scoop Oral TID WC  . sodium chloride flush  10-40 mL Intracatheter Q12H  . sodium chloride flush  3 mL Intravenous Q12H  . Warfarin - Pharmacist Dosing Inpatient   Does not apply q1800      Otelia Santee, MD 11/22/2015, 8:39 AM

## 2015-11-23 LAB — RENAL FUNCTION PANEL
ANION GAP: 8 (ref 5–15)
ANION GAP: 7 (ref 5–15)
Albumin: 3.1 g/dL — ABNORMAL LOW (ref 3.5–5.0)
Albumin: 3.5 g/dL (ref 3.5–5.0)
BUN: 52 mg/dL — ABNORMAL HIGH (ref 6–20)
BUN: 42 mg/dL — ABNORMAL HIGH (ref 6–20)
CALCIUM: 9.1 mg/dL (ref 8.9–10.3)
CO2: 27 mmol/L (ref 22–32)
CO2: 27 mmol/L (ref 22–32)
Calcium: 8.6 mg/dL — ABNORMAL LOW (ref 8.9–10.3)
Chloride: 103 mmol/L (ref 101–111)
Chloride: 101 mmol/L (ref 101–111)
Creatinine, Ser: 1.76 mg/dL — ABNORMAL HIGH (ref 0.44–1.00)
Creatinine, Ser: 1.82 mg/dL — ABNORMAL HIGH (ref 0.44–1.00)
GFR calc Af Amer: 33 mL/min — ABNORMAL LOW (ref >60)
GFR calc non Af Amer: 28 mL/min — ABNORMAL LOW (ref >60)
GFR, EST AFRICAN AMERICAN: 31 mL/min — AB (ref >60)
GFR, EST NON AFRICAN AMERICAN: 27 mL/min — AB (ref >60)
GLUCOSE: 109 mg/dL — AB (ref 65–99)
Glucose, Bld: 185 mg/dL — ABNORMAL HIGH (ref 65–99)
PHOSPHORUS: 2.7 mg/dL (ref 2.5–4.6)
PHOSPHORUS: 2.6 mg/dL (ref 2.5–4.6)
POTASSIUM: 5.2 mmol/L — AB (ref 3.5–5.1)
Potassium: 5.2 mmol/L — ABNORMAL HIGH (ref 3.5–5.1)
SODIUM: 135 mmol/L (ref 135–145)
Sodium: 138 mmol/L (ref 135–145)

## 2015-11-23 LAB — PROTIME-INR
INR: 2.24 — ABNORMAL HIGH (ref 0.00–1.49)
Prothrombin Time: 24.6 seconds — ABNORMAL HIGH (ref 11.6–15.2)

## 2015-11-23 LAB — BASIC METABOLIC PANEL
ANION GAP: 7 (ref 5–15)
BUN: 53 mg/dL — ABNORMAL HIGH (ref 6–20)
CALCIUM: 8.7 mg/dL — AB (ref 8.9–10.3)
CHLORIDE: 104 mmol/L (ref 101–111)
CO2: 29 mmol/L (ref 22–32)
Creatinine, Ser: 1.62 mg/dL — ABNORMAL HIGH (ref 0.44–1.00)
GFR calc non Af Amer: 31 mL/min — ABNORMAL LOW (ref >60)
GFR, EST AFRICAN AMERICAN: 36 mL/min — AB (ref >60)
Glucose, Bld: 113 mg/dL — ABNORMAL HIGH (ref 65–99)
Potassium: 5.2 mmol/L — ABNORMAL HIGH (ref 3.5–5.1)
Sodium: 140 mmol/L (ref 135–145)

## 2015-11-23 LAB — CBC
HCT: 27.8 % — ABNORMAL LOW (ref 36.0–46.0)
HEMOGLOBIN: 8.3 g/dL — AB (ref 12.0–15.0)
MCH: 27.2 pg (ref 26.0–34.0)
MCHC: 29.9 g/dL — ABNORMAL LOW (ref 30.0–36.0)
MCV: 91.1 fL (ref 78.0–100.0)
Platelets: 242 10*3/uL (ref 150–400)
RBC: 3.05 MIL/uL — AB (ref 3.87–5.11)
RDW: 16.2 % — ABNORMAL HIGH (ref 11.5–15.5)
WBC: 7.6 10*3/uL (ref 4.0–10.5)

## 2015-11-23 LAB — GLUCOSE, CAPILLARY
GLUCOSE-CAPILLARY: 118 mg/dL — AB (ref 65–99)
GLUCOSE-CAPILLARY: 174 mg/dL — AB (ref 65–99)
Glucose-Capillary: 186 mg/dL — ABNORMAL HIGH (ref 65–99)
Glucose-Capillary: 125 mg/dL — ABNORMAL HIGH (ref 65–99)

## 2015-11-23 LAB — MAGNESIUM: Magnesium: 2.5 mg/dL — ABNORMAL HIGH (ref 1.7–2.4)

## 2015-11-23 MED ORDER — WARFARIN SODIUM 5 MG PO TABS
5.0000 mg | ORAL_TABLET | Freq: Once | ORAL | Status: AC
  Administered 2015-11-23: 5 mg via ORAL
  Filled 2015-11-23: qty 1

## 2015-11-23 MED ORDER — INSULIN ASPART 100 UNIT/ML ~~LOC~~ SOLN
0.0000 [IU] | Freq: Three times a day (TID) | SUBCUTANEOUS | Status: DC
  Administered 2015-11-23 – 2015-11-24 (×2): 3 [IU] via SUBCUTANEOUS
  Administered 2015-11-24: 2 [IU] via SUBCUTANEOUS

## 2015-11-23 MED ORDER — WARFARIN SODIUM 5 MG PO TABS: 5.0000 mg | ORAL_TABLET | Freq: Once | ORAL | Status: DC

## 2015-11-23 NOTE — Progress Notes (Signed)
PROGRESS NOTE  Briana [name] [id number] DOB: Jan 19, 1946 DOA: 11/10/2015 PCP: Antionette Fairy, PA-C   LOS: 13 days   Brief Narrative: 70 y.o. female with past medical history significant for chronic diastolic CHF (in [id number] TEE with EF 55%), hypertension, dyslipidemia, atrial fibrillation on anticoagulation with Coumadin, recently hospitalized 5/6-5/24 of 2017 for pneumonia which was then complicated further by A.Fib RVR which was unstable and required cardioversion. She also had NSTEMI which was managed medically and no cardiac cath due to acute kidney failure. She was discharged to inpatient rehab.   Patient presented to [facility] with worsening shortness of breath over past 2 weeks prior to this admission, or extremity edema. She was seen by primary care physician the week prior to the admission and was instructed to take Lasix twice daily whereas before she was on 40 mg once a day Lasix. There was no significant symptomatic relief for which reason she presented to ED for further evaluation.   Hospital course complicated with worsening renal function and fluid overload for which nephrology was consulted.   Assessment & Plan: Principal Problem:   Acute on chronic diastolic (congestive) heart failure (HCC) Active Problems:   Acute on chronic diastolic CHF (congestive heart failure) (HCC)   Controlled diabetes mellitus type 2 with complications (HCC)   CAD in native artery   Chronic kidney disease (CKD), stage IV (severe) (HCC)   Essential hypertension   Anemia of chronic kidney failure   Acute on chronic diastolic CHF  - Weight since 6/23: 109.7 kg --> 109.2 kg --> 108.8 kg --> 109.3 kg --> 108.1 kg >> 104.4 >> 97 kg - CRRT started 6/28, continue for 3 days, through tomorrow. - Continue metolazone - Continue daily weight and strict intake and output - Supplement electrolytes as needed  CKD stage 4 / Hyperkalemia  - Creatinine 3 weeks ago 2.75 - Renal following - on  CRRT  Anemia of chronic kidney disease stage 4 - Hemoglobin 7.6 >> 8.3. stable  Atrial fibrillation  - CHADS vasc score 5 - Recent TEE 09/2015 done for cardioversion - Continue amiodarone 200 mg daily and metoprolol 25 mg twice daily.  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 - Continue 70/30 mix 15 units twice daily - Continue sliding-scale insulin - CBGs in past 24 hours: 109-118  Dyslipidemia associated with type 2 diabetes mellitus - Continue Lipitor 80 mg at bedtime - Continue daily aspirin   Essential hypertension - Continue Norvasc 10 mg daily, Bidil TID - hold metoprolol for now due to bradycardia   DVT prophylaxis: Coumadin Code Status: Full Family Communication: no family bedside Disposition Plan: remain in SDU  Consultants:   Nephrology  Cardiology  Procedures:   CRRT  Antimicrobials:  None    Subjective: - no chest pain, shortness of breath, no abdominal pain, nausea or vomiting. Feeling "great".  Objective: Filed Vitals:   11/23/15 1000 11/23/15 1100 11/23/15 1157 11/23/15 1200  BP: 175/85 168/62 164/65 159/57  Pulse: 59 59 77 77  Temp:   98.3 F (36.8 C)   TempSrc:   Oral   Resp: 16 12 17 14   Height:      Weight:      SpO2: 98% 98% 97%     Intake/Output Summary (Last 24 hours) at 11/23/15 1243 Last data filed at 11/23/15 1200  Gross per 24 hour  Intake    533 ml  Output   8600 ml  Net  -8067 ml   Filed Weights   11/21/15 0526 11/22/15 0430 11/23/15 0430  Weight: 108.138 kg (238 lb 6.4 oz) 104.4 kg (230 lb 2.6 oz) 97 kg (213 lb 13.5 oz)    Examination: Constitutional: NAD Filed Vitals:   11/23/15 1000 11/23/15 1100 11/23/15 1157 11/23/15 1200  BP: 175/85 168/62 164/65 159/57  Pulse: 59 59 77 77  Temp:   98.3 F (36.8 C)   TempSrc:   Oral   Resp: 16 12 17 14   Height:      Weight:      SpO2: 98% 98% 97%    Eyes: PERRL, lids and conjunctivae  normal Respiratory: clear to auscultation bilaterally, no wheezing, no crackles. Normal respiratory effort. No accessory muscle use.  Cardiovascular: Regular rate and rhythm, no murmurs / rubs / gallops. trace LE edema. 2+ pedal pulses. Abdomen: no tenderness. Bowel sounds positive.  Musculoskeletal: no clubbing / cyanosis.  Neurologic: CN 2-12 grossly intact. Strength 5/5 in all 4.   Data Reviewed: I have personally reviewed following labs and imaging studies  CBC:  Recent Labs Lab 11/18/15 0854 11/19/15 0431 11/21/15 0518 11/22/15 0507 11/23/15 0435  WBC 7.5 7.7 7.3 6.9 7.6  HGB 7.9* 7.3* 7.8* 7.6* 8.3*  HCT 25.5* 23.8* 25.3* 25.1* 27.8*  MCV 87.9 88.1 87.5 87.8 91.1  PLT 207 200 236 227 [id number]   Basic Metabolic Panel:  Recent Labs Lab 11/21/15 1303 11/22/15 0507 11/22/15 1549 11/23/15 0435 11/23/15 0500  NA 138 140 138 140 138  K 4.8 4.4 4.8 5.2* 5.2*  CL 99* 102 104 104 103  CO2 29 31 28 29 27   GLUCOSE 180* 99 239* 113* 109*  BUN 147* 113* 76* 53* 52*  CREATININE 3.38* 2.69* 2.02* 1.62* 1.76*  CALCIUM 9.1 8.8* 8.7* 8.7* 8.6*  MG  --  2.4  --  2.5*  --   PHOS 4.3 3.7 2.9  --  2.7   GFR: Estimated Creatinine Clearance: 33.6 mL/min (by C-G formula based on Cr of 1.76). Liver Function Tests:  Recent Labs Lab 11/21/15 1303 11/22/15 0507 11/22/15 1549 11/23/15 0500  ALBUMIN 3.2* 3.0* 3.0* 3.1*   Coagulation Profile:  Recent Labs Lab 11/19/15 0431 11/20/15 0542 11/21/15 0518 11/22/15 0507 11/23/15 0435  INR 1.99* 1.85* 1.72* 1.82* 2.24*   CBG:  Recent Labs Lab 11/22/15 0737 11/22/15 1120 11/22/15 1556 11/22/15 2131 11/23/15 0749  GLUCAP 109* 156* 247* 273* 118*   Anemia Panel:  Recent Labs  11/21/15 0518  FERRITIN 188  TIBC 300  IRON 42   Urine analysis:    Component Value Date/Time   COLORURINE YELLOW 11/10/2015 1521   APPEARANCEUR CLEAR 11/10/2015 1521   LABSPEC 1.013 11/10/2015 1521   PHURINE 5.5 11/10/2015 1521   GLUCOSEU  NEGATIVE 11/10/2015 1521   HGBUR NEGATIVE 11/10/2015 1521   BILIRUBINUR NEGATIVE 11/10/2015 1521   KETONESUR NEGATIVE 11/10/2015 1521   PROTEINUR 100* 11/10/2015 1521   UROBILINOGEN 0.2 03/02/2014 1508   NITRITE NEGATIVE 11/10/2015 1521   LEUKOCYTESUR TRACE* 11/10/2015 1521   Sepsis Labs: Invalid input(s): PROCALCITONIN, LACTICIDVEN  Recent Results (from the past 240 hour(s))  MRSA PCR Screening     Status: None   Collection Time: 11/21/15  2:16 PM  Result Value Ref Range Status   MRSA by PCR NEGATIVE NEGATIVE Final    Comment:        The GeneXpert MRSA Assay (FDA approved for NASAL specimens only), is one component of a comprehensive MRSA colonization surveillance program. It  is not intended to diagnose MRSA infection nor to guide or monitor treatment for MRSA infections.     Radiology Studies: Dg Chest Port 1 View  11/22/2015  CLINICAL DATA:  70 year old female with dialysis catheter placement EXAM: PORTABLE CHEST 1 VIEW COMPARISON:  Chest radiograph dated 11/10/2015 FINDINGS: There has been interval placement of a right IJ dialysis catheter with tip at the cavoatrial junction. There is cardiomegaly with mild vascular congestion. Bibasilar linear and platelike atelectasis/ scarring. Focal density at the right lung base likely represent atelectasis/scarring. Infiltrate is not excluded. Clinical correlation is recommended. Median sternotomy wires. No acute osseous pathology. IMPRESSION: Interval placement of a right IJ dialysis catheter with tip at the cavoatrial junction. No pneumothorax. Cardiomegaly with mild congestive changes. Bibasilar atelectatic changes. Infiltrate less likely. Clinical correlation is recommended. Electronically Signed   By: Anner Crete M.D.   On: 11/22/2015 00:26   Scheduled Meds: . amiodarone  200 mg Oral Daily  . amLODipine  10 mg Oral Daily  . antiseptic oral rinse  7 mL Mouth Rinse BID  . atorvastatin  80 mg Oral Daily  . bisacodyl  10 mg  Rectal Once  . calcium acetate  1,334 mg Oral TID WC  . docusate sodium  100 mg Oral BID  . insulin aspart  0-15 Units Subcutaneous TID WC  . insulin aspart  0-5 Units Subcutaneous QHS  . insulin aspart protamine- aspart  15 Units Subcutaneous BID WC  . isosorbide-hydrALAZINE  2 tablet Oral TID  . loratadine  10 mg Oral Daily  . metolazone  5 mg Oral Daily  . polyethylene glycol  17 g Oral BID  . protein supplement  1 scoop Oral TID WC  . sodium chloride flush  10-40 mL Intracatheter Q12H  . sodium chloride flush  3 mL Intravenous Q12H  . warfarin  5 mg Oral ONCE-1800  . Warfarin - Pharmacist Dosing Inpatient   Does not apply q1800   Continuous Infusions: . dialysis replacement fluid (prismasate) 500 mL/hr at 11/23/15 [id number]  . dialysis replacement fluid (prismasate) 300 mL/hr at 11/23/15 1022  . dialysate (PRISMASATE) 1,000 mL/hr at 11/23/15 [place], MD, PhD [facility] Pager [phone]  If 7PM-7AM, please contact night-coverage [email or web address] Password [facility] 11/23/2015, 12:43 PM

## 2015-11-23 NOTE — Progress Notes (Signed)
[facility] Progress Note   Seen on CRRT @ 840am RIJ cath 4K bath Pre/Post 500/300 Net UF set at 375ml/hr which she is tolerating  Assessment/ Plan:   1 CKD 3/4 - repeat labs in the am. - stable but on dialysis; therefore, difficult to assess. She is still making urine without diuretics which is a good sign.  2 Volume overload/CHF - Continue diuresis to optimize CO and RBF - Increased the Metolazone to 5mg  daily on 6/26 with good response  - Because of declining renal function and massive volume overload we initiated CRRT @ 1AM 6/28 (D#1) --> CRRT will be much more physiologic and less likely to cause AKI c/w iHD. - Already on fluid restriction - Stopped the IV diuretics while on CRRT - Hopefully she will need CRRT only 3 days --> will assess daily --> will decrease net UF to 23ml/hr. Edema in lower ext markedly improved but still has some fluid in the lungs.   --> likely will decrease to net UF 213ml/hr ~5pm and then d/c CRRT Fri 7AM. - we need to record her weight before she leaves the hospital and this will help with management in the future.  3 Nonoliguric AKI, hemodynamically mediated  Subjective:   She was initiated on CRRT @ about 1AM and feels that the legs are less tense.  Denies fever, chills, had a nonproductive cough a few days ago.  I&O net = -2350/3316/8512 over the past 3 days.   Objective:   BP 135/66 mmHg  Pulse 51  Temp(Src) 98.2 F (36.8 C) (Oral)  Resp 17  Ht 5\' 4"  (1.626 m)  Wt 97 kg (213 lb 13.5 oz)  BMI 36.69 kg/m2  SpO2 100%  Intake/Output Summary (Last 24 hours) at 11/23/15 0846 Last data filed at 11/23/15 0800  Gross per 24 hour  Intake    700 ml  Output   8914 ml  Net  -8214 ml   Weight change: -7.4 kg (-16 lb 5 oz)  Physical Exam: General appearance: alert and cooperative Back: presacral edema is present Resp: diminished breath sounds bibasilar rales to mid upper back Cardio: regular rate and rhythm, S1, S2  normal, no murmur, click, rub or gallop Extremities: tr edema  Access: RIJ cath  Imaging: Dg Chest Port 1 View  11/22/2015  CLINICAL DATA:  70 year old female with dialysis catheter placement EXAM: PORTABLE CHEST 1 VIEW COMPARISON:  Chest radiograph dated 11/10/2015 FINDINGS: There has been interval placement of a right IJ dialysis catheter with tip at the cavoatrial junction. There is cardiomegaly with mild vascular congestion. Bibasilar linear and platelike atelectasis/ scarring. Focal density at the right lung base likely represent atelectasis/scarring. Infiltrate is not excluded. Clinical correlation is recommended. Median sternotomy wires. No acute osseous pathology. IMPRESSION: Interval placement of a right IJ dialysis catheter with tip at the cavoatrial junction. No pneumothorax. Cardiomegaly with mild congestive changes. Bibasilar atelectatic changes. Infiltrate less likely. Clinical correlation is recommended. Electronically Signed   By: Anner Crete M.D.   On: 11/22/2015 00:26    Labs: BMET  Recent Labs Lab 11/20/15 1321 11/21/15 0518 11/21/15 1303 11/22/15 0507 11/22/15 1549 11/23/15 0435 11/23/15 0500  NA 138 137 138 140 138 140 138  K 4.6 4.4 4.8 4.4 4.8 5.2* 5.2*  CL 98* 98* 99* 102 104 104 103  CO2 28 28 29 31 28 29 27   GLUCOSE 248* 185* 180* 99 239* 113* 109*  BUN 137* 140* 147* 113* 76* 53* 52*  CREATININE 3.56* 3.42*  3.38* 2.69* 2.02* 1.62* 1.76*  CALCIUM 9.1 9.0 9.1 8.8* 8.7* 8.7* 8.6*  PHOS  --   --  4.3 3.7 2.9  --  2.7   CBC  Recent Labs Lab 11/19/15 0431 11/21/15 0518 11/22/15 0507 11/23/15 0435  WBC 7.7 7.3 6.9 7.6  HGB 7.3* 7.8* 7.6* 8.3*  HCT 23.8* 25.3* 25.1* 27.8*  MCV 88.1 87.5 87.8 91.1  PLT 200 236 227 242    Medications:    . amiodarone  200 mg Oral Daily  . amLODipine  10 mg Oral Daily  . antiseptic oral rinse  7 mL Mouth Rinse BID  . atorvastatin  80 mg Oral Daily  . bisacodyl  10 mg Rectal Once  . calcium acetate  1,334 mg  Oral TID WC  . docusate sodium  100 mg Oral BID  . insulin aspart  0-15 Units Subcutaneous TID WC  . insulin aspart  0-5 Units Subcutaneous QHS  . insulin aspart protamine- aspart  15 Units Subcutaneous BID WC  . isosorbide-hydrALAZINE  2 tablet Oral TID  . loratadine  10 mg Oral Daily  . metolazone  5 mg Oral Daily  . metoprolol tartrate  12.5 mg Oral BID  . polyethylene glycol  17 g Oral BID  . protein supplement  1 scoop Oral TID WC  . sodium chloride flush  10-40 mL Intracatheter Q12H  . sodium chloride flush  3 mL Intravenous Q12H  . Warfarin - Pharmacist Dosing Inpatient   Does not apply q1800      Otelia Santee, MD 11/23/2015, 8:46 AM

## 2015-11-23 NOTE — Progress Notes (Signed)
Anticoagulation/Dose adjustment CONSULT NOTE  Pharmacy Consult for warfarin Indication: atrial fibrillation  Allergies  Allergen Reactions  . Carbamazepine Other (See Comments)    Reaction to tegretol - loopy  . Clonazepam Other (See Comments)    Unknown reaction  . Gemfibrozil Other (See Comments)    Patient is not aware of this allergy but states that she has side effects to a cholesterol medication in the past  . Macrobid [Nitrofurantoin] Nausea And Vomiting  . Oxycodone Other (See Comments)    hallucinations    Patient Measurements: Height: 5\' 4"  (162.6 cm) Weight: 213 lb 13.5 oz (97 kg) IBW/kg (Calculated) : 54.7  Vital Signs: Temp: 98.2 F (36.8 C) (06/29 0800) Temp Source: Oral (06/29 0800) BP: 168/62 mmHg (06/29 1100) Pulse Rate: 59 (06/29 1100)  Labs:  Recent Labs  11/21/15 0518  11/22/15 0507 11/22/15 1549 11/23/15 0435 11/23/15 0500  HGB 7.8*  --  7.6*  --  8.3*  --   HCT 25.3*  --  25.1*  --  27.8*  --   PLT 236  --  227  --  242  --   LABPROT 20.1*  --  21.0*  --  24.6*  --   INR 1.72*  --  1.82*  --  2.24*  --   CREATININE 3.42*  < > 2.69* 2.02* 1.62* 1.76*  < > = values in this interval not displayed.  Estimated Creatinine Clearance: 33.6 mL/min (by C-G formula based on Cr of 1.76).  Assessment: 70 y.o. female who presents for evaluation of shortness of breath, orthopnea, and peripheral edema.  She is noted with HF and significant volume overload. CRRT has been started for fluid removal. She is on warfarin for Hx Afib and Pharmacy consulted to dose -INR= 1.82 and below goal   Warfarin: *PTA dose: 5mg  daily except 2.5mg  on Tues/Thurs (last PTA dose 6/15) INR on admit 2.55  Goal of Therapy:  INR 2-3 Monitor platelets by anticoagulation protocol: Yes   Plan:  -Warfarin 5 mg x 1 tonight -Daily INR  Uvaldo Rising, BCPS  Clinical Pharmacist Pager [phone]  11/23/2015 11:11 AM

## 2015-11-23 NOTE — Progress Notes (Signed)
Hospital Problem List     Principal Problem:   Acute on chronic diastolic (congestive) heart failure (HCC) Active Problems:   Acute on chronic diastolic CHF (congestive heart failure) (HCC)   Controlled diabetes mellitus type 2 with complications (HCC)   CAD in native artery   Chronic kidney disease (CKD), stage IV (severe) (HCC)   Essential hypertension   Anemia of chronic kidney failure    Patient Profile:   Primary Cardiologist: Dr. Bronson Ing  70 yo female w/ PMH of Type 2 DM, HTN, HLD, PVD, Stage 4 CKD, A-fib (on coumadin), CAD (s/p CABG 2005) and chronic diastolic CHF admitted [id number] w/ CHF exacerbation.   Subjective   Started on CRRT yesterday, with a net output of -8.5L. Weight recorded as being down 17 lbs since yesterday. Reports significant improvement in her edema and respiratory status.    Inpatient Medications    . amiodarone  200 mg Oral Daily  . amLODipine  10 mg Oral Daily  . antiseptic oral rinse  7 mL Mouth Rinse BID  . atorvastatin  80 mg Oral Daily  . bisacodyl  10 mg Rectal Once  . calcium acetate  1,334 mg Oral TID WC  . docusate sodium  100 mg Oral BID  . insulin aspart  0-15 Units Subcutaneous TID WC  . insulin aspart  0-5 Units Subcutaneous QHS  . insulin aspart protamine- aspart  15 Units Subcutaneous BID WC  . isosorbide-hydrALAZINE  2 tablet Oral TID  . loratadine  10 mg Oral Daily  . metolazone  5 mg Oral Daily  . metoprolol tartrate  12.5 mg Oral BID  . polyethylene glycol  17 g Oral BID  . protein supplement  1 scoop Oral TID WC  . sodium chloride flush  10-40 mL Intracatheter Q12H  . sodium chloride flush  3 mL Intravenous Q12H  . Warfarin - Pharmacist Dosing Inpatient   Does not apply q1800    Vital Signs    Filed Vitals:   11/23/15 0500 11/23/15 0600 11/23/15 0700 11/23/15 0800  BP: 143/45 144/45 155/43 135/66  Pulse: 50 49 52 51  Temp:    98.2 F (36.8 C)  TempSrc:    Oral  Resp: 13 19 26 17   Height:      Weight:        SpO2: 100% 100% 97% 100%    Intake/Output Summary (Last 24 hours) at 11/23/15 0838 Last data filed at 11/23/15 0800  Gross per 24 hour  Intake    460 ml  Output   8914 ml  Net  -8454 ml   Filed Weights   11/21/15 0526 11/22/15 0430 11/23/15 0430  Weight: 238 lb 6.4 oz (108.138 kg) 230 lb 2.6 oz (104.4 kg) 213 lb 13.5 oz (97 kg)    Physical Exam    General: Well developed, well nourished, female appearing in no acute distress. Head: Normocephalic, atraumatic.  Neck: Supple without bruits, JVD elevated. Lungs:  Resp regular and unlabored, CTA without wheezing or rales. Heart: RRR, S1, S2, no S3, S4, 2/6 SEM at RUSB; no rub. Abdomen: Soft, non-tender, non-distended with normoactive bowel sounds. No hepatomegaly. No rebound/guarding. No obvious abdominal masses. Extremities: No clubbing or cyanosis,  1+ edema bilaterally, significantly improved from prior exams. Distal pedal pulses are 2+ bilaterally. Neuro: Alert and oriented X 3. Moves all extremities spontaneously. Psych: Normal affect.  Labs    CBC  Recent Labs  11/22/15 0507 11/23/15 0435  WBC 6.9 7.6  HGB 7.6*  8.3*  HCT 25.1* 27.8*  MCV 87.8 91.1  PLT 227 [id number]   Basic Metabolic Panel  Recent Labs  11/22/15 0507 11/22/15 1549 11/23/15 0435 11/23/15 0500  NA 140 138 140 138  K 4.4 4.8 5.2* 5.2*  CL 102 104 104 103  CO2 31 28 29 27   GLUCOSE 99 239* 113* 109*  BUN 113* 76* 53* 52*  CREATININE 2.69* 2.02* 1.62* 1.76*  CALCIUM 8.8* 8.7* 8.7* 8.6*  MG 2.4  --  2.5*  --   PHOS 3.7 2.9  --  2.7   Liver Function Tests  Recent Labs  11/22/15 1549 11/23/15 0500  ALBUMIN 3.0* 3.1*    Telemetry    Sinus bradycardia, HR in mid-40's - 50's.    Cardiac Studies and Radiology    Dg Chest Port 1 View  11/22/2015  CLINICAL DATA:  70 year old female with dialysis catheter placement EXAM: PORTABLE CHEST 1 VIEW COMPARISON:  Chest radiograph dated 11/10/2015 FINDINGS: There has been interval placement of a right  IJ dialysis catheter with tip at the cavoatrial junction. There is cardiomegaly with mild vascular congestion. Bibasilar linear and platelike atelectasis/ scarring. Focal density at the right lung base likely represent atelectasis/scarring. Infiltrate is not excluded. Clinical correlation is recommended. Median sternotomy wires. No acute osseous pathology. IMPRESSION: Interval placement of a right IJ dialysis catheter with tip at the cavoatrial junction. No pneumothorax. Cardiomegaly with mild congestive changes. Bibasilar atelectatic changes. Infiltrate less likely. Clinical correlation is recommended. Electronically Signed   By: Anner Crete M.D.   On: 11/22/2015 00:26   Assessment & Plan    1. Acute on chronic diastolic CHF  - Echo in [id number] showing EF of 55-60% with Grade 3 DD. - was started on IV diuresis at admission with a net output of -19L but still appeared volume overloaded on examination and creatinine was continuing to increase. Started on CRRT on 11/22/2015 with a net output of -8.5L yesterday. Overall -27.8L thus far.  - weight 239 lbs on admission, at 213 lbs on 11/23/2015. - continue with CRRT (per Nephrology). Continue Amlodipine and BB (BB held the past two days secondary to bradycardia).    2. Progressive Stage 5 CKD  - started on CRRT 11/22/2015. Creatinine improved to 1.76 this AM. - per Nephrology.   3. Paroxysmal Atrial Fibrillation  - maintaining NSR. Continue Amiodarone and Lopressor. She is mostly bradycardiac in the mid-40's - 50's. Lopressor dosing has been held today and yesterday. Dosing decreased from 25mg  BID to 12.5mg  BID with hold parameters still in place. Will likely need to be discontinued prior to discharge.  - This patients CHA2DS2-VASc Score and unadjusted Ischemic Stroke Rate (% per year) is equal to 7.2 % stroke rate/year from a score of 5 (HTN, CAD, CHF, Female, Age). Continue Coumadin per pharmacy dosing.  Arna Medici , PA-C 8:38  AM 11/23/2015 Pager: [phone]  Patient seen, examined. Available data reviewed. Agree with findings, assessment, and plan as outlined by Bernerd Pho, PA-C. Leg edema nearly resolved. Heart RRR with 2/6 systolic murmur at the LSB. Lungs CTA. Pt markedly improved with CRRT. Dr Aron Baba note reviewed. Will stop her beta-blocker as HR remains low and she hasn't been getting metoprolol because of hold parameters. Otherwise continue current cardiac medications which are reviewed today.  Sherren Mocha, M.D. 11/23/2015 10:22 AM

## 2015-11-23 NOTE — Progress Notes (Signed)
Physical Therapy Treatment Patient Details Name: Briana [name] MRN: [id number] DOB: [birth year] Today's Date: 11/23/2015    History of Present Illness Pt is a 70 y/o F who was just admitted last month for multi week (5/6-5/24) hospital stay that started with treatment in the ICU for PNA, followed by diuresis for CHF exacerbation. This was then complicated further by A.Fib RVR which was unstable and required cardioversion. Around that time she also had AKF develop that thankfully would resolve. Also had NSTEMI with trop elevation during that stay that was managed medically, no cath due to kidney function.Pt presents this admission w/ c/o SOB, peripheral edema, and DOE.  Admitting Dx: acute on chronic diastolic CHF.  Pt's PMH includes PVD, depression, anxiety, neuropathy, a-fib.    PT Comments    Ms. Epps remains motivated and agreeable to therapy.  Activity limited to stand pivot transfer due to CRRT.  Completed strengthening therapeutic exercises once sitting in recliner chair. Pt will benefit from continued skilled PT services to increase functional independence and safety.   Follow Up Recommendations  SNF;Supervision for mobility/OOB     Equipment Recommendations  Rolling walker with 5" wheels    Recommendations for Other Services       Precautions / Restrictions Precautions Precautions: Fall Precaution Comments: CRRT; O2 drops with activity Restrictions Weight Bearing Restrictions: No    Mobility  Bed Mobility Overal bed mobility: Needs Assistance Bed Mobility: Supine to Sit     Supine to sit: Supervision;HOB elevated     General bed mobility comments: Increased time but no use of bed rail and no need for physical assist  Transfers Overall transfer level: Needs assistance Equipment used: None Transfers: Sit to/from Stand;Stand Pivot Transfers Sit to Stand: Supervision Stand pivot transfers: Supervision       General transfer comment: Supervision for  safety and managing lines (RN managing CRRT line)  Ambulation/Gait             General Gait Details: unable due to CRRT   Stairs            Wheelchair Mobility    Modified Rankin (Stroke Patients Only)       Balance Overall balance assessment: Needs assistance Sitting-balance support: No upper extremity supported;Feet supported Sitting balance-Leahy Scale: Good     Standing balance support: No upper extremity supported;During functional activity Standing balance-Leahy Scale: Fair                      Cognition Arousal/Alertness: Awake/alert Behavior During Therapy: WFL for tasks assessed/performed (still joking, but looks very fatigued) Overall Cognitive Status: Within Functional Limits for tasks assessed                      Exercises General Exercises - Upper Extremity Elbow Flexion: Strengthening;Both;Other (comment);AROM;20 reps (w/ manual resistance) Digit Composite Flexion: AROM;Both;10 reps General Exercises - Lower Extremity Ankle Circles/Pumps: AROM;Both;10 reps;Seated Long Arc Quad: Strengthening;Both;10 reps;Seated;Other (comment) (w/ light manual resistance) Hip ABduction/ADduction: Strengthening;Both;5 reps;Seated;Other (comment) (w/ manual resistance) Hip Flexion/Marching: Both;10 reps;Seated    General Comments        Pertinent Vitals/Pain Pain Assessment: Faces Faces Pain Scale: Hurts even more Pain Location: brief cramp in Lt and Rt calf after stand pivot  Pain Descriptors / Indicators: Cramping;Grimacing Pain Intervention(s): Limited activity within patient's tolerance;Monitored during session;Repositioned    Home Living  Prior Function            PT Goals (current goals can now be found in the care plan section) Acute Rehab PT Goals Patient Stated Goal: to feel better and get stronger PT Goal Formulation: With patient Time For Goal Achievement: 11/26/15 Potential to Achieve Goals:  Good Progress towards PT goals: Not progressing toward goals - comment (limited by CRRT this session)    Frequency  Min 2X/week    PT Plan Current plan remains appropriate    Co-evaluation             End of Session   Activity Tolerance: Patient limited by fatigue Patient left: in chair;with call bell/phone within reach     Time: [id number] PT Time Calculation (min) (ACUTE ONLY): 26 min  Charges:  $Therapeutic Exercise: 23-37 mins                    G Codes:       Collie Siad PT, DPT  Pager: [phone] Phone: [phone] 11/23/2015, 5:23 PM

## 2015-11-24 LAB — RENAL FUNCTION PANEL
ALBUMIN: 3.6 g/dL (ref 3.5–5.0)
ANION GAP: 9 (ref 5–15)
BUN: 36 mg/dL — ABNORMAL HIGH (ref 6–20)
CALCIUM: 8.8 mg/dL — AB (ref 8.9–10.3)
CO2: 26 mmol/L (ref 22–32)
Chloride: 102 mmol/L (ref 101–111)
Creatinine, Ser: 2.1 mg/dL — ABNORMAL HIGH (ref 0.44–1.00)
GFR calc Af Amer: 26 mL/min — ABNORMAL LOW (ref >60)
GFR, EST NON AFRICAN AMERICAN: 23 mL/min — AB (ref >60)
GLUCOSE: 121 mg/dL — AB (ref 65–99)
PHOSPHORUS: 2.3 mg/dL — AB (ref 2.5–4.6)
Potassium: 4.9 mmol/L (ref 3.5–5.1)
SODIUM: 137 mmol/L (ref 135–145)

## 2015-11-24 LAB — GLUCOSE, CAPILLARY
GLUCOSE-CAPILLARY: 135 mg/dL — AB (ref 65–99)
GLUCOSE-CAPILLARY: 137 mg/dL — AB (ref 65–99)
Glucose-Capillary: 105 mg/dL — ABNORMAL HIGH (ref 65–99)
Glucose-Capillary: 193 mg/dL — ABNORMAL HIGH (ref 65–99)

## 2015-11-24 LAB — POCT I-STAT, CHEM 8
BUN: 35 mg/dL — ABNORMAL HIGH (ref 6–20)
CREATININE: 2 mg/dL — AB (ref 0.44–1.00)
Calcium, Ion: 1.09 mmol/L — ABNORMAL LOW (ref 1.12–1.23)
Chloride: 103 mmol/L (ref 101–111)
GLUCOSE: 125 mg/dL — AB (ref 65–99)
HCT: 29 % — ABNORMAL LOW (ref 36.0–46.0)
HEMOGLOBIN: 9.9 g/dL — AB (ref 12.0–15.0)
POTASSIUM: 4.8 mmol/L (ref 3.5–5.1)
Sodium: 139 mmol/L (ref 135–145)
TCO2: 24 mmol/L (ref 0–100)

## 2015-11-24 LAB — CBC
HCT: 32.3 % — ABNORMAL LOW (ref 36.0–46.0)
Hemoglobin: 9.7 g/dL — ABNORMAL LOW (ref 12.0–15.0)
MCH: 27.4 pg (ref 26.0–34.0)
MCHC: 30 g/dL (ref 30.0–36.0)
MCV: 91.2 fL (ref 78.0–100.0)
PLATELETS: 279 10*3/uL (ref 150–400)
RBC: 3.54 MIL/uL — AB (ref 3.87–5.11)
RDW: 16.4 % — ABNORMAL HIGH (ref 11.5–15.5)
WBC: 8.4 10*3/uL (ref 4.0–10.5)

## 2015-11-24 LAB — BASIC METABOLIC PANEL
Anion gap: 10 (ref 5–15)
BUN: 36 mg/dL — AB (ref 6–20)
CO2: 26 mmol/L (ref 22–32)
CREATININE: 2.11 mg/dL — AB (ref 0.44–1.00)
Calcium: 8.9 mg/dL (ref 8.9–10.3)
Chloride: 102 mmol/L (ref 101–111)
GFR calc Af Amer: 26 mL/min — ABNORMAL LOW (ref >60)
GFR, EST NON AFRICAN AMERICAN: 23 mL/min — AB (ref >60)
Glucose, Bld: 121 mg/dL — ABNORMAL HIGH (ref 65–99)
POTASSIUM: 4.9 mmol/L (ref 3.5–5.1)
SODIUM: 138 mmol/L (ref 135–145)

## 2015-11-24 LAB — PROTIME-INR
INR: 2.1 — ABNORMAL HIGH (ref 0.00–1.49)
Prothrombin Time: 23.4 seconds — ABNORMAL HIGH (ref 11.6–15.2)

## 2015-11-24 LAB — MAGNESIUM: MAGNESIUM: 2.6 mg/dL — AB (ref 1.7–2.4)

## 2015-11-24 MED ORDER — INSULIN ASPART 100 UNIT/ML ~~LOC~~ SOLN
0.0000 [IU] | Freq: Three times a day (TID) | SUBCUTANEOUS | Status: DC
  Administered 2015-11-25: 3 [IU] via SUBCUTANEOUS
  Administered 2015-11-25 – 2015-11-26 (×2): 2 [IU] via SUBCUTANEOUS
  Administered 2015-11-26 – 2015-11-27 (×3): 3 [IU] via SUBCUTANEOUS
  Administered 2015-11-28: 2 [IU] via SUBCUTANEOUS
  Administered 2015-11-28: 5 [IU] via SUBCUTANEOUS
  Administered 2015-11-30 (×3): 3 [IU] via SUBCUTANEOUS
  Administered 2015-12-01 (×2): 2 [IU] via SUBCUTANEOUS
  Administered 2015-12-01 – 2015-12-02 (×2): 3 [IU] via SUBCUTANEOUS
  Administered 2015-12-02: 2 [IU] via SUBCUTANEOUS
  Administered 2015-12-02: 5 [IU] via SUBCUTANEOUS
  Administered 2015-12-03 (×2): 2 [IU] via SUBCUTANEOUS
  Administered 2015-12-04: 3 [IU] via SUBCUTANEOUS
  Administered 2015-12-05: 5 [IU] via SUBCUTANEOUS
  Administered 2015-12-05 – 2015-12-06 (×3): 3 [IU] via SUBCUTANEOUS
  Administered 2015-12-06 – 2015-12-07 (×2): 2 [IU] via SUBCUTANEOUS
  Administered 2015-12-07 – 2015-12-08 (×3): 3 [IU] via SUBCUTANEOUS
  Administered 2015-12-09: 5 [IU] via SUBCUTANEOUS
  Administered 2015-12-09: 2 [IU] via SUBCUTANEOUS
  Administered 2015-12-10: 3 [IU] via SUBCUTANEOUS

## 2015-11-24 MED ORDER — SODIUM CHLORIDE 0.9 % IV SOLN
Freq: Once | INTRAVENOUS | Status: AC
  Administered 2015-11-24: 08:00:00 via INTRAVENOUS

## 2015-11-24 MED ORDER — WARFARIN SODIUM 7.5 MG PO TABS
7.5000 mg | ORAL_TABLET | Freq: Once | ORAL | Status: AC
  Administered 2015-11-24: 7.5 mg via ORAL
  Filled 2015-11-24: qty 1

## 2015-11-24 NOTE — Progress Notes (Signed)
Anticoagulation/Dose adjustment CONSULT NOTE  Pharmacy Consult for warfarin Indication: atrial fibrillation  Allergies  Allergen Reactions  . Carbamazepine Other (See Comments)    Reaction to tegretol - loopy  . Clonazepam Other (See Comments)    Unknown reaction  . Gemfibrozil Other (See Comments)    Patient is not aware of this allergy but states that she has side effects to a cholesterol medication in the past  . Macrobid [Nitrofurantoin] Nausea And Vomiting  . Oxycodone Other (See Comments)    hallucinations    Patient Measurements: Height: 5\' 4"  (162.6 cm) Weight: 200 lb 9.9 oz (91 kg) IBW/kg (Calculated) : 54.7  Vital Signs: Temp: 97.5 F (36.4 C) (06/30 1215) Temp Source: Oral (06/30 1215) BP: 119/42 mmHg (06/30 1200) Pulse Rate: 60 (06/30 1200)  Labs:  Recent Labs  11/22/15 0507  11/23/15 0435  11/23/15 1620 11/24/15 0042 11/24/15 0610  HGB 7.6*  --  8.3*  --   --  9.9* 9.7*  HCT 25.1*  --  27.8*  --   --  29.0* 32.3*  PLT 227  --  242  --   --   --  279  LABPROT 21.0*  --  24.6*  --   --   --  23.4*  INR 1.82*  --  2.24*  --   --   --  2.10*  CREATININE 2.69*  < > 1.62*  < > 1.82* 2.00* 2.11*  2.10*  < > = values in this interval not displayed.  Estimated Creatinine Clearance: 27.2 mL/min (by C-G formula based on Cr of 2.1).  Assessment: 70 y.o. female who presents for evaluation of shortness of breath, orthopnea, and peripheral edema.  She is noted with HF and significant volume overload. CRRT has been started for fluid removal. She is on warfarin for Hx Afib and Pharmacy consulted to dose -INR= 2.1.  She has been requiring doses larger than her home dose.   Warfarin: *PTA dose: 5mg  daily except 2.5mg  on Tues/Thurs (last PTA dose 6/15) INR on admit 2.55  Goal of Therapy:  INR 2-3 Monitor platelets by anticoagulation protocol: Yes   Plan:  -Warfarin 7.5 mg x 1 tonight -Daily INR  Uvaldo Rising, BCPS  Clinical Pharmacist Pager [phone]  11/24/2015 1:09 PM

## 2015-11-24 NOTE — Progress Notes (Signed)
PROGRESS NOTE  Briana [name] [id number] DOB: [birth year] DOA: 11/10/2015 PCP: Antionette Fairy, PA-C   LOS: 14 days   Brief Narrative: 70 y.o. female with past medical history significant for chronic diastolic CHF (in [id number] with EF 55%), hypertension, dyslipidemia, atrial fibrillation on anticoagulation with Coumadin, recently hospitalized 5/6-5/24 of 2017 for pneumonia which was then complicated further by A.Fib RVR which was unstable and required cardioversion. She also had NSTEMI which was managed medically and no cardiac cath due to acute kidney failure. She was discharged to inpatient rehab.   Patient presented to [facility] with worsening shortness of breath over past 2 weeks prior to this admission, or extremity edema. She was seen by primary care physician the week prior to the admission and was instructed to take Lasix twice daily whereas before she was on 40 mg once a day Lasix. There was no significant symptomatic relief for which reason she presented to ED for further evaluation.   Hospital course complicated with worsening renal function and fluid overload for which nephrology was consulted.   Assessment & Plan: Principal Problem:   Acute on chronic diastolic (congestive) heart failure (HCC) Active Problems:   Acute on chronic diastolic CHF (congestive heart failure) (HCC)   Controlled diabetes mellitus type 2 with complications (HCC)   CAD in native artery   Chronic kidney disease (CKD), stage IV (severe) (HCC)   Essential hypertension   Anemia of chronic kidney failure   Acute on chronic diastolic CHF  - Weight since 6/23: 109.7 kg --> 109.2 kg --> 108.8 kg --> 109.3 kg --> 108.1 kg >> 104.4 >> 97 kg >> 91 - CRRT started 6/28, d/c today  - Continue metolazone - Continue daily weight and strict intake and output - Supplement electrolytes as needed - fluid down and receiving 1 L back   CKD stage 4 / Hyperkalemia  - Creatinine 3 weeks ago 2.75 - Renal  following - watchful monitoring for now off CRRT  Anemia of chronic kidney disease stage 4 - Hemoglobin 7.6 >> 8.3. stable  Atrial fibrillation  - CHADS vasc score 5 - Recent TEE 09/2015 done for cardioversion - Continue amiodarone 200 mg daily and metoprolol 25 mg twice daily.  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 Hands and feet are cramping  Objective: Filed Vitals:   11/24/15 0900 11/24/15 0927 11/24/15 1000 11/24/15 1100  BP: 87/72 113/45 119/42 118/49  Pulse: 61  59 58  Temp:      TempSrc:      Resp: 20  22 19   Height:      Weight:      SpO2: 90%  93% 95%    Intake/Output Summary (Last 24 hours) at 11/24/15 1238 Last data filed at 11/24/15 1100  Gross per 24 hour  Intake 877.92 ml  Output  4625 ml  Net -3747.08 ml   Filed Weights   11/23/15 0430 11/24/15 0403 11/24/15 0600  Weight: 97 kg (213 lb 13.5 oz) 92 kg (202 lb 13.2 oz) 91 kg (200 lb 9.9 oz)    Examination: Constitutional: NAD Filed Vitals:   11/24/15 0900 11/24/15 0927 11/24/15 1000 11/24/15 1100  BP: 87/72 113/45 119/42 118/49  Pulse: 61  59 58  Temp:      TempSrc:      Resp: 20  22 19   Height:      Weight:      SpO2: 90%  93% 95%   Eyes: PERRL, lids and conjunctivae normal Respiratory: clear to auscultation  bilaterally, no wheezing, no crackles. Normal respiratory effort. No accessory muscle use.  Cardiovascular: Regular rate and rhythm, no murmurs / rubs / gallops. No LE edema. Abdomen: no tenderness. Bowel sounds positive.  Musculoskeletal: no clubbing / cyanosis.  Neurologic: CN 2-12 grossly intact. Strength 5/5 in all 4.   Data Reviewed: I have personally reviewed following labs and imaging studies  CBC:  Recent Labs Lab 11/19/15 0431 11/21/15 0518 11/22/15 0507 11/23/15 0435 11/24/15 0042 11/24/15 0610  WBC 7.7 7.3 6.9 7.6  --  8.4  HGB 7.3* 7.8* 7.6* 8.3* 9.9* 9.7*  HCT 23.8* 25.3* 25.1* 27.8* 29.0* 32.3*  MCV 88.1 87.5 87.8 91.1  --  91.2  PLT 200 236 227 242  --  [id number]   Basic Metabolic Panel:  Recent Labs Lab 11/22/15 0507 11/22/15 1549 11/23/15 0435 11/23/15 0500 11/23/15 1620 11/24/15 0042 11/24/15 0610  NA 140 138 140 138 135 139 138  137  K 4.4 4.8 5.2* 5.2* 5.2* 4.8 4.9  4.9  CL 102 104 104 103 101 103 102  102  CO2 31 28 29 27 27   --  26  26  GLUCOSE 99 239* 113* 109* 185* 125* 121*  121*  BUN 113* 76* 53* 52* 42* 35* 36*  36*  CREATININE 2.69* 2.02* 1.62* 1.76* 1.82* 2.00* 2.11*  2.10*  CALCIUM 8.8* 8.7* 8.7* 8.6* 9.1  --  8.9  8.8*  MG 2.4  --  2.5*  --   --   --  2.6*  PHOS 3.7 2.9  --  2.7 2.6  --  2.3*   GFR: Estimated Creatinine Clearance: 27.2 mL/min (by C-G formula based on Cr of 2.1). Liver Function Tests:  Recent Labs Lab 11/22/15 0507 11/22/15 1549 11/23/15 0500 11/23/15 1620 11/24/15 0610  ALBUMIN 3.0* 3.0* 3.1* 3.5 3.6   Coagulation Profile:  Recent Labs Lab 11/20/15 0542 11/21/15 0518 11/22/15 0507 11/23/15 0435 11/24/15 0610  INR 1.85* 1.72* 1.82* 2.24* 2.10*   CBG:  Recent Labs Lab 11/23/15 0749 11/23/15 1154 11/23/15 1614 11/23/15 2136 11/24/15 0847  GLUCAP 118* 174* 186* 125* 135*   Anemia Panel: No results for input(s): VITAMINB12, FOLATE, FERRITIN, TIBC, IRON, RETICCTPCT in the last 72  hours. Urine analysis:    Component Value Date/Time   COLORURINE YELLOW 11/10/2015 1521   APPEARANCEUR CLEAR 11/10/2015 1521   LABSPEC 1.013 11/10/2015 1521   PHURINE 5.5 11/10/2015 1521   GLUCOSEU NEGATIVE 11/10/2015 1521   HGBUR NEGATIVE 11/10/2015 1521   BILIRUBINUR NEGATIVE 11/10/2015 1521   KETONESUR NEGATIVE 11/10/2015 1521   PROTEINUR 100* 11/10/2015 1521   UROBILINOGEN 0.2 03/02/2014 1508   NITRITE NEGATIVE 11/10/2015 1521   LEUKOCYTESUR TRACE* 11/10/2015 1521   Sepsis Labs: Invalid input(s): PROCALCITONIN, LACTICIDVEN  Recent Results (from the past 240 hour(s))  MRSA PCR Screening  Status: None   Collection Time: 11/21/15  2:16 PM  Result Value Ref Range Status   MRSA by PCR NEGATIVE NEGATIVE Final    Comment:        The GeneXpert MRSA Assay (FDA approved for NASAL specimens only), is one component of a comprehensive MRSA colonization surveillance program. It is not intended to diagnose MRSA infection nor to guide or monitor treatment for MRSA infections.     Radiology Studies: No results found. Scheduled Meds: . amiodarone  200 mg Oral Daily  . amLODipine  10 mg Oral Daily  . antiseptic oral rinse  7 mL Mouth Rinse BID  . atorvastatin  80 mg Oral Daily  . bisacodyl  10 mg Rectal Once  . calcium acetate  1,334 mg Oral TID WC  . docusate sodium  100 mg Oral BID  . insulin aspart  0-15 Units Subcutaneous TID WC  . insulin aspart  0-5 Units Subcutaneous QHS  . insulin aspart protamine- aspart  15 Units Subcutaneous BID WC  . isosorbide-hydrALAZINE  2 tablet Oral TID  . loratadine  10 mg Oral Daily  . metolazone  5 mg Oral Daily  . polyethylene glycol  17 g Oral BID  . protein supplement  1 scoop Oral TID WC  . sodium chloride flush  10-40 mL Intracatheter Q12H  . sodium chloride flush  3 mL Intravenous Q12H  . Warfarin - Pharmacist Dosing Inpatient   Does not apply q1800   Continuous Infusions: . dialysis replacement fluid (prismasate) 500 mL/hr at  11/24/15 0304  . dialysis replacement fluid (prismasate) 300 mL/hr at 11/24/15 0304  . dialysate (PRISMASATE) 1,000 mL/hr at 11/24/15 0304    Marzetta Board, MD, PhD [facility] Pager [phone]  If 7PM-7AM, please contact night-coverage [email or web address] Password [facility] 11/24/2015, 12:38 PM

## 2015-11-24 NOTE — Progress Notes (Signed)
    Subjective:  Feels tired today. Having cramps in her hands and legs. No chest pain or shortness of breath  Objective:  Vital Signs in the last 24 hours: Temp:  [98.3 F (36.8 C)-99 F (37.2 C)] 98.7 F (37.1 C) (06/30 0800) Pulse Rate:  [50-77] 61 (06/30 0900) Resp:  [12-26] 20 (06/30 0900) BP: (87-173)/(34-72) 113/45 mmHg (06/30 0927) SpO2:  [90 %-100 %] 90 % (06/30 0900) Weight:  [200 lb 9.9 oz (91 kg)-202 lb 13.2 oz (92 kg)] 200 lb 9.9 oz (91 kg) (06/30 0600)  Intake/Output from previous day: 06/29 0701 - 06/30 0700 In: 493 [P.O.:480; I.V.:13] Out: 6150 [Urine:200]  Physical Exam: Pt is alert and oriented, NAD HEENT: normal Neck: JVP - normal Lungs: CTA bilaterally CV: RRR 2/6 systolic murmur at the left sternal border Abd: soft, NT, Positive BS, no hepatomegaly Ext: no C/C/E Skin: warm/dry no rash   Lab Results:  Recent Labs  11/23/15 0435 11/24/15 0042 11/24/15 0610  WBC 7.6  --  8.4  HGB 8.3* 9.9* 9.7*  PLT 242  --  279    Recent Labs  11/23/15 1620 11/24/15 0042 11/24/15 0610  NA 135 139 138  137  K 5.2* 4.8 4.9  4.9  CL 101 103 102  102  CO2 27  --  26  26  GLUCOSE 185* 125* 121*  121*  BUN 42* 35* 36*  36*  CREATININE 1.82* 2.00* 2.11*  2.10*   No results for input(s): TROPONINI in the last 72 hours.  Invalid input(s): CK, MB  Tele: Normal sinus rhythm  Assessment/Plan:  1. Acute on chronic diastolic heart failure: Molly management per the renal team. Fluids are being given back because the patient is volume deplete today. All of her edema has resolved.  2. Stage V chronic kidney disease: Per renal team  3. Paroxysmal atrial fibrillation: The patient is maintaining sinus rhythm on amiodarone. Metoprolol was discontinued because of bradycardia. Patient is treated with warfarin and her INR is therapeutic.  The patient is stable from a cardiac perspective. The renal team is managing her volume in the context of advanced kidney  disease and CRRT therapy. Please call if we can be of any further help in her care.   Sherren Mocha, M.D. 11/24/2015, 10:04 AM

## 2015-11-24 NOTE — Progress Notes (Signed)
[facility] Progress Note   Seen on CRRT @ 725am RIJ cath 4K bath Pre/Post 500/300 Net UF set at 145ml/hr   Assessment/ Plan:   1 CKD 3/4 - repeat labs in the am. - stable but on dialysis; therefore, difficult to assess.   2 Volume overload/CHF - Continue diuresis to optimize CO and RBF - Increased the Metolazone to 5mg  daily on 6/26 with good response  - Because of declining renal function and massive volume overload we initiated CRRT @ 1AM 6/28 (D#1) --> CRRT will be much more physiologic and less likely to cause AKI c/w iHD. - Already on fluid restriction  - Stopped the IV diuretics while on CRRT --> do not restart today (she looks a little volume down now  --> will give a liter of NS. If she still has symptoms may need to give another liter.  --> let's weight her on a floor scale when her symptoms resolve after saline.  - Stop  CRRT this AM (already spoke with nurses to rinse back).  - we need to record her weight before she leaves the hospital and this will help with management in the future.  3 Nonoliguric AKI, hemodynamically mediated  Subjective:   Not feeling well with cramps in extremities.  Denies fever, chills, or dyspnea.  I&O net = -2350/3316/8512/-5657 over the past 4 days.   Objective:   BP 117/39 mmHg  Pulse 56  Temp(Src) 98.3 F (36.8 C) (Oral)  Resp 15  Ht 5\' 4"  (1.626 m)  Wt 91 kg (200 lb 9.9 oz)  BMI 34.42 kg/m2  SpO2 100%  Intake/Output Summary (Last 24 hours) at 11/24/15 0738 Last data filed at 11/24/15 0700  Gross per 24 hour  Intake    493 ml  Output   6150 ml  Net  -5657 ml   Weight change: -5 kg (-11 lb 0.4 oz)  Physical Exam: General appearance: alert and cooperative Back: no edema  Resp: no rales  Cardio: regular rate and rhythm, S1, S2 normal, no murmur, click, rub or gallop Extremities: NO edema  Access: RIJ cath  Imaging: No results found.  Labs: BMET  Recent Labs Lab 11/21/15 1303  11/22/15 0507 11/22/15 1549 11/23/15 0435 11/23/15 0500 11/23/15 1620 11/24/15 0042 11/24/15 0610  NA 138 140 138 140 138 135 139 138  137  K 4.8 4.4 4.8 5.2* 5.2* 5.2* 4.8 4.9  4.9  CL 99* 102 104 104 103 101 103 102  102  CO2 29 31 28 29 27 27   --  26  26  GLUCOSE 180* 99 239* 113* 109* 185* 125* 121*  121*  BUN 147* 113* 76* 53* 52* 42* 35* 36*  36*  CREATININE 3.38* 2.69* 2.02* 1.62* 1.76* 1.82* 2.00* 2.11*  2.10*  CALCIUM 9.1 8.8* 8.7* 8.7* 8.6* 9.1  --  8.9  8.8*  PHOS 4.3 3.7 2.9  --  2.7 2.6  --  2.3*   CBC  Recent Labs Lab 11/21/15 0518 11/22/15 0507 11/23/15 0435 11/24/15 0042 11/24/15 0610  WBC 7.3 6.9 7.6  --  8.4  HGB 7.8* 7.6* 8.3* 9.9* 9.7*  HCT 25.3* 25.1* 27.8* 29.0* 32.3*  MCV 87.5 87.8 91.1  --  91.2  PLT 236 227 242  --  279    Medications:    . sodium chloride   Intravenous Once  . amiodarone  200 mg Oral Daily  . amLODipine  10 mg Oral Daily  . antiseptic oral rinse  7 mL  Mouth Rinse BID  . atorvastatin  80 mg Oral Daily  . bisacodyl  10 mg Rectal Once  . calcium acetate  1,334 mg Oral TID WC  . docusate sodium  100 mg Oral BID  . insulin aspart  0-15 Units Subcutaneous TID WC  . insulin aspart  0-5 Units Subcutaneous QHS  . insulin aspart protamine- aspart  15 Units Subcutaneous BID WC  . isosorbide-hydrALAZINE  2 tablet Oral TID  . loratadine  10 mg Oral Daily  . metolazone  5 mg Oral Daily  . polyethylene glycol  17 g Oral BID  . protein supplement  1 scoop Oral TID WC  . sodium chloride flush  10-40 mL Intracatheter Q12H  . sodium chloride flush  3 mL Intravenous Q12H  . Warfarin - Pharmacist Dosing Inpatient   Does not apply q1800      Otelia Santee, MD 11/24/2015, 7:38 AM

## 2015-11-25 LAB — RENAL FUNCTION PANEL
ALBUMIN: 3.3 g/dL — AB (ref 3.5–5.0)
ANION GAP: 11 (ref 5–15)
BUN: 68 mg/dL — ABNORMAL HIGH (ref 6–20)
CALCIUM: 9.1 mg/dL (ref 8.9–10.3)
CO2: 23 mmol/L (ref 22–32)
CREATININE: 3.34 mg/dL — AB (ref 0.44–1.00)
Chloride: 100 mmol/L — ABNORMAL LOW (ref 101–111)
GFR, EST AFRICAN AMERICAN: 15 mL/min — AB (ref >60)
GFR, EST NON AFRICAN AMERICAN: 13 mL/min — AB (ref >60)
Glucose, Bld: 104 mg/dL — ABNORMAL HIGH (ref 65–99)
PHOSPHORUS: 3.3 mg/dL (ref 2.5–4.6)
Potassium: 5.2 mmol/L — ABNORMAL HIGH (ref 3.5–5.1)
SODIUM: 134 mmol/L — AB (ref 135–145)

## 2015-11-25 LAB — GLUCOSE, CAPILLARY
GLUCOSE-CAPILLARY: 106 mg/dL — AB (ref 65–99)
GLUCOSE-CAPILLARY: 115 mg/dL — AB (ref 65–99)
Glucose-Capillary: 125 mg/dL — ABNORMAL HIGH (ref 65–99)
Glucose-Capillary: 180 mg/dL — ABNORMAL HIGH (ref 65–99)

## 2015-11-25 LAB — PROTIME-INR
INR: 2.54 — AB (ref 0.00–1.49)
Prothrombin Time: 27 seconds — ABNORMAL HIGH (ref 11.6–15.2)

## 2015-11-25 LAB — MAGNESIUM: MAGNESIUM: 2.6 mg/dL — AB (ref 1.7–2.4)

## 2015-11-25 MED ORDER — FUROSEMIDE 40 MG PO TABS
40.0000 mg | ORAL_TABLET | Freq: Every day | ORAL | Status: DC
  Administered 2015-11-25: 40 mg via ORAL
  Filled 2015-11-25: qty 1

## 2015-11-25 MED ORDER — WARFARIN SODIUM 5 MG PO TABS
5.0000 mg | ORAL_TABLET | Freq: Once | ORAL | Status: AC
  Administered 2015-11-25: 5 mg via ORAL
  Filled 2015-11-25: qty 1

## 2015-11-25 NOTE — Progress Notes (Signed)
[facility] Progress Note    Assessment/ Plan:   1 CKD 4 - BL Cr ~2.8-3.2 range. The bump in creatinine is because she was on CRRT leading to clearance.  2 Volume overload/CHF - Increased the Metolazone to 5mg  daily on 6/26 with good response  - Because of declining renal function and massive volume overload we initiated CRRT @ 1AM 6/28 (D#1)- 6/30 AM with total of 16L removed via CRRT with 1L NS given back when she had cramping. - Already on fluid restriction  - Let's restart Lasix 40mg  PO daily (increase to BID dosing if needed) (restarted on 7/1).  - Stopped CRRT 6/30 AM   - we need to record her weight before she leaves the hospital and this will help with management in the future --> floor scale EDW ~94.5kg - will pull RIJ cath hopefully in the next 24-48hrs.  3 Nonoliguric AKI, hemodynamically mediated but she still has advanced CKD (already stage IV)  Subjective:   She is feeling much better  with no further cramps in extremities.  Denies fever, chills, or dyspnea.  I&O net = -2350/-3316/-8512/-5657/+1568 over the past 5 days.   Objective:   BP 137/47 mmHg  Pulse 63  Temp(Src) 98.5 F (36.9 C) (Oral)  Resp 18  Ht 5\' 4"  (1.626 m)  Wt 94.394 kg (208 lb 1.6 oz)  BMI 35.70 kg/m2  SpO2 94%  Intake/Output Summary (Last 24 hours) at 11/25/15 1125 Last data filed at 11/25/15 0900  Gross per 24 hour  Intake   1570 ml  Output    425 ml  Net   1145 ml   Weight change: 2.3 kg (5 lb 1.1 oz)  Physical Exam: General appearance: alert and cooperative Back: no edema  Resp: no rales  Cardio: regular rate and rhythm, S1, S2 normal, no murmur, click, rub or gallop Extremities: NO edema  Access: RIJ cath  Imaging: No results found.  Labs: BMET  Recent Labs Lab 11/21/15 1303 11/22/15 0507 11/22/15 1549 11/23/15 0435 11/23/15 0500 11/23/15 1620 11/24/15 0042 11/24/15 0610 11/25/15 0655  NA 138 140 138 140 138 135 139 138  137 134*  K  4.8 4.4 4.8 5.2* 5.2* 5.2* 4.8 4.9  4.9 5.2*  CL 99* 102 104 104 103 101 103 102  102 100*  CO2 29 31 28 29 27 27   --  26  26 23   GLUCOSE 180* 99 239* 113* 109* 185* 125* 121*  121* 104*  BUN 147* 113* 76* 53* 52* 42* 35* 36*  36* 68*  CREATININE 3.38* 2.69* 2.02* 1.62* 1.76* 1.82* 2.00* 2.11*  2.10* 3.34*  CALCIUM 9.1 8.8* 8.7* 8.7* 8.6* 9.1  --  8.9  8.8* 9.1  PHOS 4.3 3.7 2.9  --  2.7 2.6  --  2.3* 3.3   CBC  Recent Labs Lab 11/21/15 0518 11/22/15 0507 11/23/15 0435 11/24/15 0042 11/24/15 0610  WBC 7.3 6.9 7.6  --  8.4  HGB 7.8* 7.6* 8.3* 9.9* 9.7*  HCT 25.3* 25.1* 27.8* 29.0* 32.3*  MCV 87.5 87.8 91.1  --  91.2  PLT 236 227 242  --  279    Medications:    . amiodarone  200 mg Oral Daily  . amLODipine  10 mg Oral Daily  . atorvastatin  80 mg Oral Daily  . bisacodyl  10 mg Rectal Once  . calcium acetate  1,334 mg Oral TID WC  . docusate sodium  100 mg Oral BID  . furosemide  40  mg Oral Daily  . insulin aspart  0-15 Units Subcutaneous TID WC  . insulin aspart  0-5 Units Subcutaneous QHS  . insulin aspart protamine- aspart  15 Units Subcutaneous BID WC  . isosorbide-hydrALAZINE  2 tablet Oral TID  . loratadine  10 mg Oral Daily  . metolazone  5 mg Oral Daily  . polyethylene glycol  17 g Oral BID  . protein supplement  1 scoop Oral TID WC  . sodium chloride flush  10-40 mL Intracatheter Q12H  . sodium chloride flush  3 mL Intravenous Q12H  . Warfarin - Pharmacist Dosing Inpatient   Does not apply q1800      Otelia Santee, MD 11/25/2015, 11:25 AM

## 2015-11-25 NOTE — Progress Notes (Signed)
Anticoagulation/Dose adjustment CONSULT NOTE  Pharmacy Consult for warfarin Indication: atrial fibrillation  Allergies  Allergen Reactions  . Carbamazepine Other (See Comments)    Reaction to tegretol - loopy  . Clonazepam Other (See Comments)    Unknown reaction  . Gemfibrozil Other (See Comments)    Patient is not aware of this allergy but states that she has side effects to a cholesterol medication in the past  . Macrobid [Nitrofurantoin] Nausea And Vomiting  . Oxycodone Other (See Comments)    hallucinations    Patient Measurements: Height: 5\' 4"  (162.6 cm) Weight: 208 lb 1.6 oz (94.394 kg) IBW/kg (Calculated) : 54.7  Vital Signs: Temp: 97.8 F (36.6 C) (07/01 1150) Temp Source: Oral (07/01 1150) BP: 116/43 mmHg (07/01 1150) Pulse Rate: 67 (07/01 1150)  Labs:  Recent Labs  11/23/15 0435  11/24/15 0042 11/24/15 0610 11/25/15 0655  HGB 8.3*  --  9.9* 9.7*  --   HCT 27.8*  --  29.0* 32.3*  --   PLT 242  --   --  279  --   LABPROT 24.6*  --   --  23.4* 27.0*  INR 2.24*  --   --  2.10* 2.54*  CREATININE 1.62*  < > 2.00* 2.11*  2.10* 3.34*  < > = values in this interval not displayed.  Estimated Creatinine Clearance: 17.5 mL/min (by C-G formula based on Cr of 3.34).  Assessment: 70 y.o. female who presents for evaluation of shortness of breath, orthopnea, and peripheral edema.  She is on warfarin for Hx Afib and Pharmacy consulted to dose -INR= 2.54.  She has been requiring doses larger than her home dose.   Warfarin: *PTA dose: 5mg  daily except 2.5mg  on Tues/Thurs (last PTA dose 6/15) INR on admit 2.55  Goal of Therapy:  INR 2-3 Monitor platelets by anticoagulation protocol: Yes   Plan:  - Warfarin 5 mg PO x 1 tonight - Daily INR, CBC - Monitor s/sx of bleeding  Cassie L. Nicole Kindred, PharmD Clinical Pharmacist Pager: [phone] 11/25/2015 2:59 PM

## 2015-11-25 NOTE — Progress Notes (Signed)
PROGRESS NOTE  Briana [name] [id number] DOB: 11-30-1945 DOA: 11/10/2015 PCP: Antionette Fairy, PA-C   LOS: 15 days   Brief Narrative: 70 y.o. female with past medical history significant for chronic diastolic CHF (in [id number] TEE with EF 55%), hypertension, dyslipidemia, atrial fibrillation on anticoagulation with Coumadin, recently hospitalized 5/6-5/24 of 2017 for pneumonia which was then complicated further by A.Fib RVR which was unstable and required cardioversion. She also had NSTEMI which was managed medically and no cardiac cath due to acute kidney failure. She was discharged to inpatient rehab.   Patient presented to [facility] with worsening shortness of breath over past 2 weeks prior to this admission, or extremity edema. She was seen by primary care physician the week prior to the admission and was instructed to take Lasix twice daily whereas before she was on 40 mg once a day Lasix. There was no significant symptomatic relief for which reason she presented to ED for further evaluation.   Hospital course complicated with worsening renal function and fluid overload for which nephrology was consulted. Patient was eventually moved to ICU and was on CRRT between 6/28 and 6/30 with significant improvement in her fluid status. Transferred to telemetry 6/30.   Assessment & Plan: Principal Problem:   Acute on chronic diastolic (congestive) heart failure (HCC) Active Problems:   Acute on chronic diastolic CHF (congestive heart failure) (HCC)   Controlled diabetes mellitus type 2 with complications (HCC)   CAD in native artery   Chronic kidney disease (CKD), stage IV (severe) (HCC)   Essential hypertension   Anemia of chronic kidney failure   Acute on chronic diastolic CHF  - Weight since 6/23: 109.7 kg --> 109.2 kg --> 108.8 kg --> 109.3 kg --> 108.1 kg >> 104.4 >> 97 kg >> 91 /// CRRT stopped > weight now 94 kg >> - CRRT started 6/28 - 6/30 - Continue metolazone, other  diuretics per nephrology  - Continue daily weight and strict intake and output - Supplement electrolytes as needed - after CRRT was quite dehydrated requiring 1L NS to be given back   CKD stage 4 / Hyperkalemia  - Creatinine 3 weeks ago 2.75 - Renal following - BUN / Cr increasing today. UOP charted as 300 cc over the past 24h, however patient had one unmeasured UOP - K 5.2  Anemia of chronic kidney disease stage 4 - Hemoglobin 7.6 >> 8.3. stable  Atrial fibrillation  - CHADS vasc score 5 - Recent TEE 09/2015 done for cardioversion - Continue amiodarone 200 mg daily and metoprolol 25 mg twice daily.  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 - Continue 70/30 mix 15 units twice daily - Continue sliding-scale insulin - CBGs in past 24 hours: 104-125  Dyslipidemia associated with type 2 diabetes mellitus - Continue Lipitor 80 mg at bedtime - Continue daily aspirin   Essential hypertension - Continue Norvasc 10 mg daily, Bidil TID - hold metoprolol for now due to bradycardia   DVT prophylaxis: Coumadin Code Status: Full Family Communication: no family bedside Disposition Plan: remain in SDU  Consultants:   Nephrology  Cardiology - s/o 6/30  Procedures:   CRRT  Antimicrobials:  None    Subjective: - much better today, cramping is gone. Able to move around in the room  Objective: Filed Vitals:   11/24/15 1500 11/24/15 1555 11/24/15 2048 11/25/15 0623  BP: 118/34 122/63 154/62 148/43  Pulse: 58 62 88 63  Temp:  99 F (37.2 C) 99.3 F (37.4 C) 98.5 F (36.9 C)  TempSrc:  Oral Oral Oral  Resp: 15  16 18   Height:  5\' 4"  (1.626 m)    Weight:  94.3 kg (207 lb 14.3 oz)  94.394 kg (208 lb 1.6 oz)  SpO2: 94% 95% 93% 94%    Intake/Output Summary (Last 24 hours) at 11/25/15 1054 Last data filed at 11/25/15 0900  Gross per 24 hour  Intake   1695 ml  Output    425 ml  Net   1270 ml    Filed Weights   11/24/15 0600 11/24/15 1555 11/25/15 0623  Weight: 91 kg (200 lb 9.9 oz) 94.3 kg (207 lb 14.3 oz) 94.394 kg (208 lb 1.6 oz)    Examination: Constitutional: NAD Filed Vitals:   11/24/15 1500 11/24/15 1555 11/24/15 2048 11/25/15 0623  BP: 118/34 122/63 154/62 148/43  Pulse: 58 62 88 63  Temp:  99 F (37.2 C) 99.3 F (37.4 C) 98.5 F (36.9 C)  TempSrc:  Oral Oral Oral  Resp: 15  16 18   Height:  5\' 4"  (1.626 m)    Weight:  94.3 kg (207 lb 14.3 oz)  94.394 kg (208 lb 1.6 oz)  SpO2: 94% 95% 93% 94%   Eyes: PERRL, lids and conjunctivae normal Respiratory: clear to auscultation bilaterally, no wheezing, no crackles. Normal respiratory effort. No accessory muscle use.  Cardiovascular: Regular rate and rhythm, no murmurs / rubs / gallops. Trace LE edema. Abdomen: no tenderness. Bowel sounds positive.  Musculoskeletal: no clubbing / cyanosis.  Neurologic: non focal   Data Reviewed: I have personally reviewed following labs and imaging studies  CBC:  Recent Labs Lab 11/19/15 0431 11/21/15 0518 11/22/15 0507 11/23/15 0435 11/24/15 0042 11/24/15 0610  WBC 7.7 7.3 6.9 7.6  --  8.4  HGB 7.3* 7.8* 7.6* 8.3* 9.9* 9.7*  HCT 23.8* 25.3* 25.1* 27.8* 29.0* 32.3*  MCV 88.1 87.5 87.8 91.1  --  91.2  PLT 200 236 227 242  --  [id number]   Basic Metabolic Panel:  Recent Labs Lab 11/22/15 0507 11/22/15 1549 11/23/15 0435 11/23/15 0500 11/23/15 1620 11/24/15 0042 11/24/15 0610 11/25/15 0655  NA 140 138 140 138 135 139 138  137 134*  K 4.4 4.8 5.2* 5.2* 5.2* 4.8 4.9  4.9 5.2*  CL 102 104 104 103 101 103 102  102 100*  CO2 31 28 29 27 27   --  26  26 23   GLUCOSE 99 239* 113* 109* 185* 125* 121*  121* 104*  BUN 113* 76* 53* 52* 42* 35* 36*  36* 68*  CREATININE 2.69* 2.02* 1.62* 1.76* 1.82* 2.00* 2.11*  2.10* 3.34*  CALCIUM 8.8* 8.7* 8.7* 8.6* 9.1  --  8.9  8.8* 9.1  MG 2.4  --  2.5*  --   --   --  2.6* 2.6*  PHOS 3.7 2.9  --  2.7 2.6  --  2.3* 3.3    GFR: Estimated Creatinine Clearance: 17.5 mL/min (by C-G formula based on Cr of 3.34). Liver Function Tests:  Recent Labs Lab 11/22/15 1549 11/23/15 0500 11/23/15 1620 11/24/15 0610 11/25/15 0655  ALBUMIN 3.0* 3.1* 3.5 3.6 3.3*   Coagulation Profile:  Recent Labs Lab 11/21/15 0518 11/22/15 0507 11/23/15 0435 11/24/15 0610 11/25/15 0655  INR 1.72* 1.82* 2.24* 2.10* 2.54*   CBG:  Recent Labs Lab 11/24/15 0847 11/24/15 1255 11/24/15 1633 11/24/15 2234 11/25/15 0552  GLUCAP 135* 105* 193* 137* 125*  Anemia Panel: No results for input(s): VITAMINB12, FOLATE, FERRITIN, TIBC, IRON, RETICCTPCT in the last 72 hours. Urine analysis:    Component Value Date/Time   COLORURINE YELLOW 11/10/2015 1521   APPEARANCEUR CLEAR 11/10/2015 1521   LABSPEC 1.013 11/10/2015 1521   PHURINE 5.5 11/10/2015 1521   GLUCOSEU NEGATIVE 11/10/2015 1521   HGBUR NEGATIVE 11/10/2015 1521   BILIRUBINUR NEGATIVE 11/10/2015 1521   KETONESUR NEGATIVE 11/10/2015 1521   PROTEINUR 100* 11/10/2015 1521   UROBILINOGEN 0.2 03/02/2014 1508   NITRITE NEGATIVE 11/10/2015 1521   LEUKOCYTESUR TRACE* 11/10/2015 1521   Sepsis Labs: Invalid input(s): PROCALCITONIN, LACTICIDVEN  Recent Results (from the past 240 hour(s))  MRSA PCR Screening     Status: None   Collection Time: 11/21/15  2:16 PM  Result Value Ref Range Status   MRSA by PCR NEGATIVE NEGATIVE Final    Comment:        The GeneXpert MRSA Assay (FDA approved for NASAL specimens only), is one component of a comprehensive MRSA colonization surveillance program. It is not intended to diagnose MRSA infection nor to guide or monitor treatment for MRSA infections.     Radiology Studies: No results found. Scheduled Meds: . amiodarone  200 mg Oral Daily  . amLODipine  10 mg Oral Daily  . atorvastatin  80 mg Oral Daily  . bisacodyl  10 mg Rectal Once  . calcium acetate  1,334 mg Oral TID WC  . docusate sodium  100 mg Oral BID  .  insulin aspart  0-15 Units Subcutaneous TID WC  . insulin aspart  0-5 Units Subcutaneous QHS  . insulin aspart protamine- aspart  15 Units Subcutaneous BID WC  . isosorbide-hydrALAZINE  2 tablet Oral TID  . loratadine  10 mg Oral Daily  . metolazone  5 mg Oral Daily  . polyethylene glycol  17 g Oral BID  . protein supplement  1 scoop Oral TID WC  . sodium chloride flush  10-40 mL Intracatheter Q12H  . sodium chloride flush  3 mL Intravenous Q12H  . Warfarin - Pharmacist Dosing Inpatient   Does not apply q1800   Continuous Infusions:    Marzetta Board, MD, PhD [facility] Pager [phone]  If 7PM-7AM, please contact night-coverage [email or web address] Password [email or web address] 11/25/2015, 10:54 AM

## 2015-11-26 LAB — GLUCOSE, CAPILLARY
GLUCOSE-CAPILLARY: 182 mg/dL — AB (ref 65–99)
GLUCOSE-CAPILLARY: 130 mg/dL — AB (ref 65–99)
Glucose-Capillary: 131 mg/dL — ABNORMAL HIGH (ref 65–99)
Glucose-Capillary: 156 mg/dL — ABNORMAL HIGH (ref 65–99)

## 2015-11-26 LAB — COMPREHENSIVE METABOLIC PANEL
ALBUMIN: 3.2 g/dL — AB (ref 3.5–5.0)
ALK PHOS: 54 U/L (ref 38–126)
ALT: 14 U/L (ref 14–54)
ANION GAP: 10 (ref 5–15)
AST: 16 U/L (ref 15–41)
BILIRUBIN TOTAL: 0.4 mg/dL (ref 0.3–1.2)
BUN: 84 mg/dL — ABNORMAL HIGH (ref 6–20)
CALCIUM: 9 mg/dL (ref 8.9–10.3)
CO2: 23 mmol/L (ref 22–32)
Chloride: 96 mmol/L — ABNORMAL LOW (ref 101–111)
Creatinine, Ser: 3.75 mg/dL — ABNORMAL HIGH (ref 0.44–1.00)
GFR calc Af Amer: 13 mL/min — ABNORMAL LOW (ref >60)
GFR calc non Af Amer: 11 mL/min — ABNORMAL LOW (ref >60)
GLUCOSE: 140 mg/dL — AB (ref 65–99)
Potassium: 5.1 mmol/L (ref 3.5–5.1)
Sodium: 129 mmol/L — ABNORMAL LOW (ref 135–145)
TOTAL PROTEIN: 6.4 g/dL — AB (ref 6.5–8.1)

## 2015-11-26 LAB — MAGNESIUM: MAGNESIUM: 2.7 mg/dL — AB (ref 1.7–2.4)

## 2015-11-26 LAB — PHOSPHORUS: Phosphorus: 4.3 mg/dL (ref 2.5–4.6)

## 2015-11-26 LAB — CBC
HEMATOCRIT: 28.3 % — AB (ref 36.0–46.0)
HEMOGLOBIN: 8.8 g/dL — AB (ref 12.0–15.0)
MCH: 27.2 pg (ref 26.0–34.0)
MCHC: 31.1 g/dL (ref 30.0–36.0)
MCV: 87.6 fL (ref 78.0–100.0)
Platelets: 257 10*3/uL (ref 150–400)
RBC: 3.23 MIL/uL — AB (ref 3.87–5.11)
RDW: 16.4 % — ABNORMAL HIGH (ref 11.5–15.5)
WBC: 9.8 10*3/uL (ref 4.0–10.5)

## 2015-11-26 LAB — PROTIME-INR
INR: 2.96 — ABNORMAL HIGH (ref 0.00–1.49)
Prothrombin Time: 30.3 seconds — ABNORMAL HIGH (ref 11.6–15.2)

## 2015-11-26 MED ORDER — WARFARIN SODIUM 2.5 MG PO TABS
2.5000 mg | ORAL_TABLET | Freq: Once | ORAL | Status: AC
  Administered 2015-11-26: 2.5 mg via ORAL
  Filled 2015-11-26: qty 1

## 2015-11-26 MED ORDER — METOLAZONE 2.5 MG PO TABS
2.5000 mg | ORAL_TABLET | Freq: Every day | ORAL | Status: DC
  Administered 2015-11-27: 2.5 mg via ORAL
  Filled 2015-11-26 (×2): qty 1

## 2015-11-26 MED ORDER — FUROSEMIDE 10 MG/ML IJ SOLN
60.0000 mg | Freq: Two times a day (BID) | INTRAMUSCULAR | Status: DC
  Administered 2015-11-26 – 2015-11-27 (×3): 60 mg via INTRAVENOUS
  Filled 2015-11-26 (×3): qty 6

## 2015-11-26 NOTE — Progress Notes (Signed)
PROGRESS NOTE  Briana [name] [id number] DOB: 06/26/1945 DOA: 11/10/2015 PCP: Antionette Fairy, PA-C   LOS: 16 days   Brief Narrative: 70 y.o. female with past medical history significant for chronic diastolic CHF (in [id number] TEE with EF 55%), hypertension, dyslipidemia, atrial fibrillation on anticoagulation with Coumadin, recently hospitalized 5/6-5/24 of 2017 for pneumonia which was then complicated further by A.Fib RVR which was unstable and required cardioversion. She also had NSTEMI which was managed medically and no cardiac cath due to acute kidney failure. She was discharged to inpatient rehab.   Patient presented to [facility] with worsening shortness of breath over past 2 weeks prior to this admission, or extremity edema. She was seen by primary care physician the week prior to the admission and was instructed to take Lasix twice daily whereas before she was on 40 mg once a day Lasix. There was no significant symptomatic relief for which reason she presented to ED for further evaluation.   Hospital course complicated with worsening renal function and fluid overload for which nephrology was consulted. Patient was eventually moved to ICU and was on CRRT between 6/28 and 6/30 with significant improvement in her fluid status. Transferred to telemetry 6/30.   Assessment & Plan: Principal Problem:   Acute on chronic diastolic (congestive) heart failure (HCC) Active Problems:   Acute on chronic diastolic CHF (congestive heart failure) (HCC)   Controlled diabetes mellitus type 2 with complications (HCC)   CAD in native artery   Chronic kidney disease (CKD), stage IV (severe) (HCC)   Essential hypertension   Anemia of chronic kidney failure   Acute on chronic diastolic CHF  - Weight since 6/23: 109.7 kg --> 109.2 kg --> 108.8 kg --> 109.3 kg --> 108.1 kg >> 104.4 >> 97 kg >> 91 /// CRRT stopped > weight now 94 kg >> 96 - CRRT 6/28 - 6/30, gained about 11 lbs since CRRT  stopped - Continue metolazone. Did not respond to po Lasix with minimal UOP, change to IV Lasix today. - Continue daily weight and strict intake and output - Supplement electrolytes as needed - after CRRT was quite dehydrated requiring 1L NS to be given back   CKD stage 4 / Hyperkalemia  - Creatinine 3 weeks ago 2.75 - Renal following - BUN / Cr continues to climb on po Lasix, UOP 325 yesterday. Weight up.  - change to IV Lasix - wonder if she is heading towards HD soon  Hyponatremia - Na 129 today. Likely fluid overload. Lasix as above  Anemia of chronic kidney disease stage 4 - Hemoglobin 7.6 >> 8.3 >> 8.8. Overall stable  Atrial fibrillation  - CHADS vasc score 5 - Recent TEE 09/2015 done for cardioversion - Continue amiodarone 200 mg daily and metoprolol 25 mg twice daily.  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 - Continue 70/30 mix 15 units twice daily - Continue sliding-scale insulin - CBGs in past 24 hours: 131-156  Dyslipidemia associated with type 2 diabetes mellitus - Continue Lipitor 80 mg at bedtime - Continue daily aspirin   Essential hypertension - Continue Norvasc 10 mg daily, Bidil TID - hold metoprolol for now due to bradycardia   DVT prophylaxis: Coumadin Code Status: Full Family Communication: no family bedside Disposition Plan: TBD  Consultants:   Nephrology  Cardiology - s/o 6/30  Procedures:   CRRT  Antimicrobials:  None    Subjective: - she is feeling well,  no chest pain / palpitations / dyspnea  Objective: Filed Vitals:   11/25/15 1150 11/25/15 2237 11/26/15 0500 11/26/15 1200  BP: 116/43 119/66 132/39 102/41  Pulse: 67 61 59 63  Temp: 97.8 F (36.6 C) 98.9 F (37.2 C) 98.4 F (36.9 C) 98.6 F (37 C)  TempSrc: Oral Oral Oral Oral  Resp: 20 17 18 20   Height:      Weight:   96.072 kg (211 lb 12.8 oz)   SpO2: 98% 98% 95% 98%    Intake/Output  Summary (Last 24 hours) at 11/26/15 1558 Last data filed at 11/26/15 1300  Gross per 24 hour  Intake    850 ml  Output    700 ml  Net    150 ml   Filed Weights   11/24/15 1555 11/25/15 0623 11/26/15 0500  Weight: 94.3 kg (207 lb 14.3 oz) 94.394 kg (208 lb 1.6 oz) 96.072 kg (211 lb 12.8 oz)    Examination: Constitutional: NAD Filed Vitals:   11/25/15 1150 11/25/15 2237 11/26/15 0500 11/26/15 1200  BP: 116/43 119/66 132/39 102/41  Pulse: 67 61 59 63  Temp: 97.8 F (36.6 C) 98.9 F (37.2 C) 98.4 F (36.9 C) 98.6 F (37 C)  TempSrc: Oral Oral Oral Oral  Resp: 20 17 18 20   Height:      Weight:   96.072 kg (211 lb 12.8 oz)   SpO2: 98% 98% 95% 98%   Eyes: PERRL, lids and conjunctivae normal Respiratory: clear to auscultation bilaterally, no wheezing, no crackles. Normal respiratory effort. No accessory muscle use.  Cardiovascular: Regular rate and rhythm, no murmurs / rubs / gallops. 1+ LE edema. Abdomen: no tenderness. Bowel sounds positive.  Musculoskeletal: no clubbing / cyanosis.  Neurologic: non focal   Data Reviewed: I have personally reviewed following labs and imaging studies  CBC:  Recent Labs Lab 11/21/15 0518 11/22/15 0507 11/23/15 0435 11/24/15 0042 11/24/15 0610 11/26/15 0435  WBC 7.3 6.9 7.6  --  8.4 9.8  HGB 7.8* 7.6* 8.3* 9.9* 9.7* 8.8*  HCT 25.3* 25.1* 27.8* 29.0* 32.3* 28.3*  MCV 87.5 87.8 91.1  --  91.2 87.6  PLT 236 227 242  --  [id number]   Basic Metabolic Panel:  Recent Labs Lab 11/22/15 0507  11/23/15 0435 11/23/15 0500 11/23/15 1620 11/24/15 0042 11/24/15 0610 11/25/15 0655 11/26/15 0435  NA 140  < > 140 138 135 139 138  137 134* 129*  K 4.4  < > 5.2* 5.2* 5.2* 4.8 4.9  4.9 5.2* 5.1  CL 102  < > 104 103 101 103 102  102 100* 96*  CO2 31  < > 29 27 27   --  26  26 23 23   GLUCOSE 99  < > 113* 109* 185* 125* 121*  121* 104* 140*  BUN 113*  < > 53* 52* 42* 35* 36*  36* 68* 84*  CREATININE 2.69*  < > 1.62* 1.76* 1.82* 2.00* 2.11*   2.10* 3.34* 3.75*  CALCIUM 8.8*  < > 8.7* 8.6* 9.1  --  8.9  8.8* 9.1 9.0  MG 2.4  --  2.5*  --   --   --  2.6* 2.6* 2.7*  PHOS 3.7  < >  --  2.7 2.6  --  2.3* 3.3 4.3  < > = values in this interval not displayed. GFR: Estimated Creatinine Clearance: 15.7 mL/min (by C-G formula based on Cr of 3.75). Liver Function Tests:  Recent Labs Lab 11/23/15 0500 11/23/15 1620 11/24/15  [id number] 11/25/15 0655 11/26/15 0435  AST  --   --   --   --  16  ALT  --   --   --   --  14  ALKPHOS  --   --   --   --  54  BILITOT  --   --   --   --  0.4  PROT  --   --   --   --  6.4*  ALBUMIN 3.1* 3.5 3.6 3.3* 3.2*   Coagulation Profile:  Recent Labs Lab 11/22/15 0507 11/23/15 0435 11/24/15 0610 11/25/15 0655 11/26/15 0435  INR 1.82* 2.24* 2.10* 2.54* 2.96*   CBG:  Recent Labs Lab 11/25/15 1138 11/25/15 1632 11/25/15 2232 11/26/15 0511 11/26/15 1135  GLUCAP 106* 180* 115* 131* 156*   Anemia Panel: No results for input(s): VITAMINB12, FOLATE, FERRITIN, TIBC, IRON, RETICCTPCT in the last 72 hours. Urine analysis:    Component Value Date/Time   COLORURINE YELLOW 11/10/2015 1521   APPEARANCEUR CLEAR 11/10/2015 1521   LABSPEC 1.013 11/10/2015 1521   PHURINE 5.5 11/10/2015 1521   GLUCOSEU NEGATIVE 11/10/2015 1521   HGBUR NEGATIVE 11/10/2015 1521   BILIRUBINUR NEGATIVE 11/10/2015 1521   KETONESUR NEGATIVE 11/10/2015 1521   PROTEINUR 100* 11/10/2015 1521   UROBILINOGEN 0.2 03/02/2014 1508   NITRITE NEGATIVE 11/10/2015 1521   LEUKOCYTESUR TRACE* 11/10/2015 1521   Sepsis Labs: Invalid input(s): PROCALCITONIN, LACTICIDVEN  Recent Results (from the past 240 hour(s))  MRSA PCR Screening     Status: None   Collection Time: 11/21/15  2:16 PM  Result Value Ref Range Status   MRSA by PCR NEGATIVE NEGATIVE Final    Comment:        The GeneXpert MRSA Assay (FDA approved for NASAL specimens only), is one component of a comprehensive MRSA colonization surveillance program. It is  not intended to diagnose MRSA infection nor to guide or monitor treatment for MRSA infections.     Radiology Studies: No results found. Scheduled Meds: . amiodarone  200 mg Oral Daily  . amLODipine  10 mg Oral Daily  . atorvastatin  80 mg Oral Daily  . bisacodyl  10 mg Rectal Once  . calcium acetate  1,334 mg Oral TID WC  . docusate sodium  100 mg Oral BID  . furosemide  60 mg Intravenous BID  . insulin aspart  0-15 Units Subcutaneous TID WC  . insulin aspart  0-5 Units Subcutaneous QHS  . insulin aspart protamine- aspart  15 Units Subcutaneous BID WC  . isosorbide-hydrALAZINE  2 tablet Oral TID  . loratadine  10 mg Oral Daily  . [START ON 11/27/2015] metolazone  2.5 mg Oral Daily  . polyethylene glycol  17 g Oral BID  . protein supplement  1 scoop Oral TID WC  . sodium chloride flush  10-40 mL Intracatheter Q12H  . sodium chloride flush  3 mL Intravenous Q12H  . warfarin  2.5 mg Oral ONCE-1800  . Warfarin - Pharmacist Dosing Inpatient   Does not apply q1800   Continuous Infusions:    Marzetta Board, MD, PhD [facility] Pager [phone]  If 7PM-7AM, please contact night-coverage [email or web address] Password [facility] 11/26/2015, 3:58 PM

## 2015-11-26 NOTE — Progress Notes (Addendum)
[facility] Progress Note  69y woman with CHF (gr 3 diastolic dysfn), DM, HTN, afib req CV for RVR during extended admission in May,  CKD IV BL Cr 2.8-3.2 p/w volume overload. She was about 16 kg over her dry weight and on very high doses of diuretics with worsening renal function --> CRRT 6/28-6/30 with total of 16L removed and given back 1L bec of cramping. Now with decreased UOP off IV diuretics and incr trend to Cr but clinically feeling much better.    Assessment/ Plan:   1 CKD 4 - BL Cr ~2.8-3.2 range. The bump in creatinine is because she was on CRRT leading to clearance.  2 Volume overload/CHF - Increased the Metolazone to 5mg  daily on 6/26 with good response  - Because of declining renal function and massive volume overload we initiated CRRT @ 1AM 6/28 (D#1)- 6/30 AM with total of 16L removed via CRRT with 1L NS given back when she had cramping. - Already on fluid restriction  - Will change to  Lasix 60 mg IV BID.  - Stopped CRRT 6/30 AM   - floor scale EDW ~95kg - will pull RIJ cath hopefully in the next 24-48hrs. - this is the 2nd prolonged admission bec of CHF/AKI on advanced CKDIV in this patient (prolonged hospitalization in May and also in June) --> if trend continues may have to consider initiation of RRT for ESRD given that the patient has very little renal reserve and also now poor quality of life bec of the prolonged hospitalizations.  3 Nonoliguric AKI, hemodynamically mediated but she still has advanced CKD (already stage IV)    Subjective:      She is feeling much better with no further cramps in extremities.  Denies fever, chills, or dyspnea.  I&O net = -2350/-3316/-8512/-5657/+1568/+1568 over the past 6 days.   Objective:   BP 132/39 mmHg  Pulse 59  Temp(Src) 98.4 F (36.9 C) (Oral)  Resp 18  Ht 5\' 4"  (1.626 m)  Wt 96.072 kg (211 lb 12.8 oz)  BMI 36.34 kg/m2  SpO2 95%  Intake/Output Summary (Last 24 hours) at 11/26/15  0847 Last data filed at 11/26/15 0600  Gross per 24 hour  Intake    820 ml  Output    325 ml  Net    495 ml   Weight change: 1.772 kg (3 lb 14.5 oz)  Physical Exam: General appearance: alert and cooperative Back: no edema  Resp: no rales  Cardio: regular rate and rhythm, S1, S2 normal, no murmur, click, rub or gallop Extremities: NO edema  Access: RIJ cath  Imaging: No results found.  Labs: BMET  Recent Labs Lab 11/22/15 0507 11/22/15 1549 11/23/15 0435 11/23/15 0500 11/23/15 1620 11/24/15 0042 11/24/15 0610 11/25/15 0655 11/26/15 0435  NA 140 138 140 138 135 139 138  137 134* 129*  K 4.4 4.8 5.2* 5.2* 5.2* 4.8 4.9  4.9 5.2* 5.1  CL 102 104 104 103 101 103 102  102 100* 96*  CO2 31 28 29 27 27   --  26  26 23 23   GLUCOSE 99 239* 113* 109* 185* 125* 121*  121* 104* 140*  BUN 113* 76* 53* 52* 42* 35* 36*  36* 68* 84*  CREATININE 2.69* 2.02* 1.62* 1.76* 1.82* 2.00* 2.11*  2.10* 3.34* 3.75*  CALCIUM 8.8* 8.7* 8.7* 8.6* 9.1  --  8.9  8.8* 9.1 9.0  PHOS 3.7 2.9  --  2.7 2.6  --  2.3* 3.3 4.3  CBC  Recent Labs Lab 11/22/15 0507 11/23/15 0435 11/24/15 0042 11/24/15 0610 11/26/15 0435  WBC 6.9 7.6  --  8.4 9.8  HGB 7.6* 8.3* 9.9* 9.7* 8.8*  HCT 25.1* 27.8* 29.0* 32.3* 28.3*  MCV 87.8 91.1  --  91.2 87.6  PLT 227 242  --  279 257    Medications:    . amiodarone  200 mg Oral Daily  . amLODipine  10 mg Oral Daily  . atorvastatin  80 mg Oral Daily  . bisacodyl  10 mg Rectal Once  . calcium acetate  1,334 mg Oral TID WC  . docusate sodium  100 mg Oral BID  . insulin aspart  0-15 Units Subcutaneous TID WC  . insulin aspart  0-5 Units Subcutaneous QHS  . insulin aspart protamine- aspart  15 Units Subcutaneous BID WC  . isosorbide-hydrALAZINE  2 tablet Oral TID  . loratadine  10 mg Oral Daily  . [START ON 11/27/2015] metolazone  2.5 mg Oral Daily  . polyethylene glycol  17 g Oral BID  . protein supplement  1 scoop Oral TID WC  . sodium chloride  flush  10-40 mL Intracatheter Q12H  . sodium chloride flush  3 mL Intravenous Q12H  . Warfarin - Pharmacist Dosing Inpatient   Does not apply q1800      Otelia Santee, MD 11/26/2015, 8:47 AM

## 2015-11-26 NOTE — Progress Notes (Signed)
Anticoagulation Follow-Up Note  Pharmacy Consult for warfarin Indication: atrial fibrillation  Allergies  Allergen Reactions  . Carbamazepine Other (See Comments)    Reaction to tegretol - loopy  . Clonazepam Other (See Comments)    Unknown reaction  . Gemfibrozil Other (See Comments)    Patient is not aware of this allergy but states that she has side effects to a cholesterol medication in the past  . Macrobid [Nitrofurantoin] Nausea And Vomiting  . Oxycodone Other (See Comments)    hallucinations    Patient Measurements: Height: 5\' 4"  (162.6 cm) Weight: 211 lb 12.8 oz (96.072 kg) IBW/kg (Calculated) : 54.7  Vital Signs: Temp: 98.4 F (36.9 C) (07/02 0500) Temp Source: Oral (07/02 0500) BP: 132/39 mmHg (07/02 0500) Pulse Rate: 59 (07/02 0500)  Labs:  Recent Labs  11/24/15 0042 11/24/15 0610 11/25/15 0655 11/26/15 0435  HGB 9.9* 9.7*  --  8.8*  HCT 29.0* 32.3*  --  28.3*  PLT  --  279  --  257  LABPROT  --  23.4* 27.0* 30.3*  INR  --  2.10* 2.54* 2.96*  CREATININE 2.00* 2.11*  2.10* 3.34* 3.75*    Estimated Creatinine Clearance: 15.7 mL/min (by C-G formula based on Cr of 3.75).  Assessment: 70 yo f on Warfarin PTA for h/o Afib. Pharmacy has been consulted to dose.  INR jumped up to 2.96 this AM so will decrease dose for tonight. CBC low but stable - hgb 8.8, plts wnl.  Warfarin PTA dose: 5mg  daily except 2.5mg  on Tues/Thurs (last PTA dose 6/15) INR on admit 2.55  Goal of Therapy:  INR 2-3 Monitor platelets by anticoagulation protocol: Yes   Plan:  - Warfarin 2.5 mg PO x 1 tonight - Daily INR, CBC - Monitor s/sx of bleeding  Kyrianna Barletta L. Nicole Kindred, PharmD Clinical Pharmacist Pager: [phone] 11/26/2015 11:33 AM

## 2015-11-27 LAB — RENAL FUNCTION PANEL
ALBUMIN: 3.4 g/dL — AB (ref 3.5–5.0)
Anion gap: 21 — ABNORMAL HIGH (ref 5–15)
BUN: 103 mg/dL — AB (ref 6–20)
CO2: 22 mmol/L (ref 22–32)
CREATININE: 4.18 mg/dL — AB (ref 0.44–1.00)
Calcium: 10.2 mg/dL (ref 8.9–10.3)
Chloride: 89 mmol/L — ABNORMAL LOW (ref 101–111)
GFR, EST AFRICAN AMERICAN: 11 mL/min — AB (ref >60)
GFR, EST NON AFRICAN AMERICAN: 10 mL/min — AB (ref >60)
Glucose, Bld: 166 mg/dL — ABNORMAL HIGH (ref 65–99)
PHOSPHORUS: 6 mg/dL — AB (ref 2.5–4.6)
POTASSIUM: 4.4 mmol/L (ref 3.5–5.1)
Sodium: 132 mmol/L — ABNORMAL LOW (ref 135–145)

## 2015-11-27 LAB — CBC
HCT: 25.6 % — ABNORMAL LOW (ref 36.0–46.0)
HEMOGLOBIN: 8.3 g/dL — AB (ref 12.0–15.0)
MCH: 27.7 pg (ref 26.0–34.0)
MCHC: 32.4 g/dL (ref 30.0–36.0)
MCV: 85.3 fL (ref 78.0–100.0)
PLATELETS: 232 10*3/uL (ref 150–400)
RBC: 3 MIL/uL — AB (ref 3.87–5.11)
RDW: 16.2 % — ABNORMAL HIGH (ref 11.5–15.5)
WBC: 8.2 10*3/uL (ref 4.0–10.5)

## 2015-11-27 LAB — PROTIME-INR
INR: 2.38 — AB (ref 0.00–1.49)
PROTHROMBIN TIME: 25.7 s — AB (ref 11.6–15.2)

## 2015-11-27 LAB — GLUCOSE, CAPILLARY
GLUCOSE-CAPILLARY: 173 mg/dL — AB (ref 65–99)
GLUCOSE-CAPILLARY: 217 mg/dL — AB (ref 65–99)
Glucose-Capillary: 123 mg/dL — ABNORMAL HIGH (ref 65–99)

## 2015-11-27 LAB — MAGNESIUM: MAGNESIUM: 2.7 mg/dL — AB (ref 1.7–2.4)

## 2015-11-27 MED ORDER — DARBEPOETIN ALFA 60 MCG/0.3ML IJ SOSY
PREFILLED_SYRINGE | INTRAMUSCULAR | Status: AC
  Administered 2015-11-27: 60 ug
  Filled 2015-11-27: qty 0.3

## 2015-11-27 MED ORDER — DARBEPOETIN ALFA 60 MCG/0.3ML IJ SOSY
60.0000 ug | PREFILLED_SYRINGE | INTRAMUSCULAR | Status: DC
  Filled 2015-11-27: qty 0.3

## 2015-11-27 MED ORDER — SODIUM CHLORIDE 0.9 % IV SOLN: 510.0000 mg | Freq: Once | INTRAVENOUS | Status: DC

## 2015-11-27 MED ORDER — WARFARIN SODIUM 7.5 MG PO TABS: 7.5000 mg | ORAL_TABLET | Freq: Once | ORAL | Status: DC

## 2015-11-27 MED ORDER — SODIUM CHLORIDE 0.9 % IV SOLN
125.0000 mg | Freq: Every day | INTRAVENOUS | Status: AC
  Administered 2015-11-27 – 2015-12-03 (×7): 125 mg via INTRAVENOUS
  Filled 2015-11-27 (×15): qty 10

## 2015-11-27 MED ORDER — SEVELAMER CARBONATE 800 MG PO TABS
800.0000 mg | ORAL_TABLET | Freq: Three times a day (TID) | ORAL | Status: DC
  Administered 2015-11-28 – 2015-12-10 (×31): 800 mg via ORAL
  Filled 2015-11-27 (×32): qty 1

## 2015-11-27 MED ORDER — WARFARIN SODIUM 5 MG PO TABS: 5.0000 mg | ORAL_TABLET | Freq: Once | ORAL | Status: DC

## 2015-11-27 NOTE — Progress Notes (Signed)
Occupational Therapy Treatment Patient Details Name: Briana [name] MRN: [id number] DOB: August 06, 1945 Today's Date: 11/27/2015    History of present illness Pt is a 70 y/o F who was just admitted last month for multi week (5/6-5/24) hospital stay that started with treatment in the ICU for PNA, followed by diuresis for CHF exacerbation. This was then complicated further by A.Fib RVR which was unstable and required cardioversion. Around that time she also had AKF develop that thankfully would resolve. Also had NSTEMI with trop elevation during that stay that was managed medically, no cath due to kidney function.Pt presents this admission w/ c/o SOB, peripheral edema, and DOE.  Admitting Dx: acute on chronic diastolic CHF.  Pt's PMH includes PVD, depression, anxiety, neuropathy, a-fib.   OT comments  Patient making great progress towards goals, upgraded goals to an overall mod I level as pt is currently at a supervision to occasional min guard level. D/C recommendations changed from SNF to home with [facility] and 24/7 at this time. Pt on RA entire session and sats greater than 90% during session.    Follow Up Recommendations  Home health OT;Supervision/Assistance - 24 hour    Equipment Recommendations  None recommended by OT    Recommendations for Other Services  None at this time   Precautions / Restrictions Precautions Precautions: Fall Precaution Comments: CRRT; O2 drops with activity Restrictions Weight Bearing Restrictions: No    Mobility Bed Mobility General bed mobility comments: Pt found seated in recliner upon OT entering/exiting room  Transfers Overall transfer level: Needs assistance Equipment used: None Transfers: Sit to/from Stand Sit to Stand: Supervision;Min guard General transfer comment: Supervision to min guard for safety, pt tended to hold onto furniture    Balance Overall balance assessment: Needs assistance Sitting-balance support: No upper extremity  supported;Feet supported Sitting balance-Leahy Scale: Good     Standing balance support: No upper extremity supported;During functional activity Standing balance-Leahy Scale: Fair Standing balance comment: Pt tended to hold onto furniture, pt reports neuropathy in bil feet and states this is the reason she's a little unsteady   ADL Overall ADL's : Needs assistance/impaired Eating/Feeding: Set up;Sitting   Grooming: Supervision/safety;Set up;Standing;Sitting   Upper Body Bathing: Set up;Sitting   Lower Body Bathing: Supervison/ safety;Sit to/from stand   Upper Body Dressing : Set up;Sitting   Lower Body Dressing: Supervision/safety;Sit to/from stand   Toilet Transfer: Min guard;Ambulation;Comfort height toilet General ADL Comments: Pt on RA and stated she already completed grooming tasks and ADL at sink level. Discussed energy conservation techniques and importance of utilizing this. Pt worked on functional ambulation around room and stood for isometric shoulder flexion exercises. After ~1 minute of exercise (after functional ambulation) pt with LOB and sat back into chair; pt able to correct and catch self. Pt is able to cross BLEs for LB ADLs and encouraged pt to do this instead of reaching down to her feet as an energy conservation tool/technique.      Cognition   Behavior During Therapy: WFL for tasks assessed/performed Overall Cognitive Status: Within Functional Limits for tasks assessed                 Pertinent Vitals/ Pain       Pain Assessment: No/denies pain         Frequency Min 2X/week     Progress Toward Goals  OT Goals(current goals can now be found in the care plan section)  Progress towards OT goals: Goals met and updated - see care plan  Acute Rehab OT Goals Patient Stated Goal: to feel better and get stronger OT Goal Formulation: With patient Time For Goal Achievement: 12/11/15 Potential to Achieve Goals: Good ADL Goals Pt Will Perform Grooming:  with modified independence;sitting;standing Pt Will Perform Upper Body Bathing: with modified independence Pt Will Perform Lower Body Bathing: with modified independence;sit to/from stand Pt Will Perform Upper Body Dressing: with modified independence Pt Will Perform Lower Body Dressing: with modified independence;sit to/from stand Pt Will Transfer to Toilet: with modified independence;ambulating;bedside commode Pt Will Perform Toileting - Clothing Manipulation and hygiene: with modified independence;sit to/from stand  Plan Discharge plan needs to be updated    Activity Tolerance Patient tolerated treatment well   Patient Left in chair;with call bell/phone within reach    Time: 0940-1003 OT Time Calculation (min): 23 min  Charges: OT General Charges $OT Visit: 1 Procedure OT Treatments $Self Care/Home Management : 23-37 mins  Chrys Racer , MS, OTR/L, CLT Pager: [phone]  11/27/2015, 10:23 AM

## 2015-11-27 NOTE — Care Management Important Message (Signed)
Important Message  Patient Details  Name: Briana [name] MRN: [id number] Date of Birth: 15-Sep-1945   Medicare Important Message Given:  Yes    Nathen May 11/27/2015, 10:53 AM

## 2015-11-27 NOTE — Progress Notes (Signed)
Anticoagulation Follow-Up Note  Pharmacy Consult for warfarin>>heparin Indication: atrial fibrillation  Allergies  Allergen Reactions  . Carbamazepine Other (See Comments)    Reaction to tegretol - loopy  . Clonazepam Other (See Comments)    Unknown reaction  . Gemfibrozil Other (See Comments)    Patient is not aware of this allergy but states that she has side effects to a cholesterol medication in the past  . Macrobid [Nitrofurantoin] Nausea And Vomiting  . Oxycodone Other (See Comments)    hallucinations    Patient Measurements: Height: 5\' 4"  (162.6 cm) Weight: 212 lb 12.8 oz (96.525 kg) (scale c) IBW/kg (Calculated) : 54.7  Vital Signs: Temp: 98.6 F (37 C) (07/03 1132) Temp Source: Oral (07/03 1132) BP: 135/56 mmHg (07/03 1132) Pulse Rate: 58 (07/03 1132)  Labs:  Recent Labs  11/25/15 0655 11/26/15 0435 11/27/15 0510  HGB  --  8.8*  --   HCT  --  28.3*  --   PLT  --  257  --   LABPROT 27.0* 30.3* 25.7*  INR 2.54* 2.96* 2.38*  CREATININE 3.34* 3.75* 4.18*    Estimated Creatinine Clearance: 14.1 mL/min (by C-G formula based on Cr of 4.18).  Assessment: 70 yo f on Warfarin PTA for h/o Afib. Pharmacy has been consulted to dose.  INR decreased to 2.36 this morning, following lower doses of 5 mg and 2.5 mg over last 48 hours.  CBC stable from 7/2.  IR to take her for tunneled cath when INR <1.5. To start heparin when INR <2 as bridge.  Warfarin PTA dose: 5mg  daily except 2.5mg  on Tues/Thurs (last PTA dose 6/15) INR on admit 2.55  Goal of Therapy:  INR 2-3 Monitor platelets by anticoagulation protocol: Yes   Plan:  -hold warfarin -daily INR- to start heparin when INR <2 -follow up after procedure for resumption of warfarin  Talene Glastetter D. Kenniel Bergsma, PharmD, BCPS Clinical Pharmacist Pager: [phone] 11/27/2015 3:09 PM

## 2015-11-27 NOTE — Progress Notes (Signed)
Offered Pt a bath, Pt stated she had a good bad on yesterday and only wanted to wash her face and hands this am.

## 2015-11-27 NOTE — Progress Notes (Signed)
Pt refusing bed alarm at this time. Pt. Verbalized understanding of pt. Safety plan.

## 2015-11-27 NOTE — Clinical Social Work Note (Signed)
CSW continues to monitor for discharge needs. Plan is still for [facility] in [place], [place].  Dayton Scrape, [place]

## 2015-11-27 NOTE — Procedures (Signed)
I have personally attended this patient's dialysis session.   1st HD since stop of CRRT Plan 3 hour treatment, 2K bath So far 1 liter off, BP stable Temp IJ cath   Jamal Maes, MD [facility] [phone] Pager 11/27/2015, 5:22 PM

## 2015-11-27 NOTE — Progress Notes (Addendum)
[facility] Progress Note    Subjective:    Only on small dose lasix and not diuresing, but more importantly creatinine continues to rise Discussed options - she is more than willing to commit to long term dialysis if it will keep her out of the hospital   Objective:   BP 135/56 mmHg  Pulse 58  Temp(Src) 98.6 F (37 C) (Oral)  Resp 20  Ht 5\' 4"  (1.626 m)  Wt 96.525 kg (212 lb 12.8 oz)  BMI 36.51 kg/m2  SpO2 95%  Intake/Output Summary (Last 24 hours) at 11/27/15 1406 Last data filed at 11/27/15 1233  Gross per 24 hour  Intake    820 ml  Output   1400 ml  Net   -580 ml   Weight change: 0.454 kg (1 lb)  Physical Exam: Very nice pale older WF Witting up in the chair R IJ trialysis catheter in place (6/26) Lungs clear anteriorly, diminished BS posteriorly Irreg rhythm S1S2 No S3 Trace sacral edema Abd no focal tenderness. Obese 2+ pitting edema both LE's  Labs:   Recent Labs Lab 11/22/15 1549 11/23/15 0435 11/23/15 0500 11/23/15 1620 11/24/15 0042 11/24/15 0610 11/25/15 0655 11/26/15 0435 11/27/15 0510  NA 138 140 138 135 139 138  137 134* 129* 132*  K 4.8 5.2* 5.2* 5.2* 4.8 4.9  4.9 5.2* 5.1 4.4  CL 104 104 103 101 103 102  102 100* 96* 89*  CO2 28 29 27 27   --  26  26 23 23 22   GLUCOSE 239* 113* 109* 185* 125* 121*  121* 104* 140* 166*  BUN 76* 53* 52* 42* 35* 36*  36* 68* 84* 103*  CREATININE 2.02* 1.62* 1.76* 1.82* 2.00* 2.11*  2.10* 3.34* 3.75* 4.18*  CALCIUM 8.7* 8.7* 8.6* 9.1  --  8.9  8.8* 9.1 9.0 10.2  PHOS 2.9  --  2.7 2.6  --  2.3* 3.3 4.3 6.0*   CBC  Recent Labs Lab 11/22/15 0507 11/23/15 0435 11/24/15 0042 11/24/15 0610 11/26/15 0435  WBC 6.9 7.6  --  8.4 9.8  HGB 7.6* 8.3* 9.9* 9.7* 8.8*  HCT 25.1* 27.8* 29.0* 32.3* 28.3*  MCV 87.8 91.1  --  91.2 87.6  PLT 227 242  --  279 257   Lab Results  Component Value Date   IRON 42 11/21/2015   TIBC 300 11/21/2015   FERRITIN 188 11/21/2015  TSat  14   Medications:    . amiodarone  200 mg Oral Daily  . amLODipine  10 mg Oral Daily  . atorvastatin  80 mg Oral Daily  . bisacodyl  10 mg Rectal Once  . calcium acetate  1,334 mg Oral TID WC  . docusate sodium  100 mg Oral BID  . furosemide  60 mg Intravenous BID  . insulin aspart  0-15 Units Subcutaneous TID WC  . insulin aspart  0-5 Units Subcutaneous QHS  . insulin aspart protamine- aspart  15 Units Subcutaneous BID WC  . isosorbide-hydrALAZINE  2 tablet Oral TID  . loratadine  10 mg Oral Daily  . metolazone  2.5 mg Oral Daily  . polyethylene glycol  17 g Oral BID  . protein supplement  1 scoop Oral TID WC  . sodium chloride flush  10-40 mL Intracatheter Q12H  . sodium chloride flush  3 mL Intravenous Q12H  . warfarin  7.5 mg Oral ONCE-1800  . Warfarin - Pharmacist Dosing Inpatient   Does not apply q1800   Background 69y woman  with CHF (gr 3 diastolic dysfn), DM, HTN, afib req CV for RVR during extended admission in May, required IHD X 1 10/12/15 for AKI. On that occasion recovered function, creatinine 10/30/15 2.42.  Readmitted 6/16 with creatinine 2.45, we were consulted at 3.3. Volume overloaded, diuresed but with worsening renal function and still massively overloaded (16 kg over dry weight)  --> CRRT 6/28-6/30 with total of 16L removed.  (temp cath placed 6/26).                                                                                                          Assessment/ Plan:    1. 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  16 liters off with CRRT 6/28-6/30. Since its discontinuation, weight drifting back up (on admittedly low dose of lasix) but creatinine rising as well, 4.12 today with BUN now close to 100.  This is the 2nd prolonged admission because of CHF/AKI on advanced CKD IV in this patient (prolonged hospitalization in May and also in June) --> I think she might be best served by initiation of long term renal replacement therapy given her limited renal  reserve, overall diuretic failure, poor QOL d/t repeated hospitalizations. She is in full agreement. Plan HD today and again tomorrow. CLIP [facility] area). Exchange temp cath for [facility]. IR has been contacted.  Vein map. VVS once vein map done. 2. A on C diastolic CHF - will be much easier to control with chronic dialysis 3. Anemia - Had 60 of Aranesp on 6/26.  Start weekly. Redose today. TSat low. Ferrlecit load. 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 5. AFib - amio/coumadin 6. DM - meds 7. HTN - meds 8. Chronic anticoagulation - need to hold coumadin, bridge with heparin, IR needs INR <1.5 to tunnel catheter.  Briana Maes, MD [facility] [phone] Pager 11/27/2015, 2:21 PM

## 2015-11-27 NOTE — Progress Notes (Signed)
PT Cancellation Note  Patient Details Name: Briana [name] MRN: [id number] DOB: 12/11/1945   Cancelled Treatment:     pt politely declines stated she just received "bad news".  Pt stated she is able to walk to and from bathroom with assist.   Fatigues easily.  "Gets hard to breath sometimes".   Will check another day/time as schedule permits.    Nathanial Rancher 11/27/2015, 3:43 PM

## 2015-11-27 NOTE — Progress Notes (Signed)
Anticoagulation Follow-Up Note  Pharmacy Consult for warfarin Indication: atrial fibrillation  Allergies  Allergen Reactions  . Carbamazepine Other (See Comments)    Reaction to tegretol - loopy  . Clonazepam Other (See Comments)    Unknown reaction  . Gemfibrozil Other (See Comments)    Patient is not aware of this allergy but states that she has side effects to a cholesterol medication in the past  . Macrobid [Nitrofurantoin] Nausea And Vomiting  . Oxycodone Other (See Comments)    hallucinations    Patient Measurements: Height: 5\' 4"  (162.6 cm) Weight: 212 lb 12.8 oz (96.525 kg) (scale c) IBW/kg (Calculated) : 54.7  Vital Signs: Temp: 98.2 F (36.8 C) (07/03 0545) Temp Source: Oral (07/03 0545) BP: 146/36 mmHg (07/03 0545) Pulse Rate: 87 (07/03 0545)  Labs:  Recent Labs  11/25/15 0655 11/26/15 0435 11/27/15 0510  HGB  --  8.8*  --   HCT  --  28.3*  --   PLT  --  257  --   LABPROT 27.0* 30.3* 25.7*  INR 2.54* 2.96* 2.38*  CREATININE 3.34* 3.75* 4.18*    Estimated Creatinine Clearance: 14.1 mL/min (by C-G formula based on Cr of 4.18).  Assessment: 70 yo f on Warfarin PTA for h/o Afib. Pharmacy has been consulted to dose.  INR decreased to 2.36 < 2.96, following lower doses of 5 mg and 2.5 mg over last 48 hours.  CBC stable from 7/2.  Warfarin PTA dose: 5mg  daily except 2.5mg  on Tues/Thurs (last PTA dose 6/15) INR on admit 2.55  Goal of Therapy:  INR 2-3 Monitor platelets by anticoagulation protocol: Yes   Plan:  1. Warfarin 7.5 mg PO x 1 tonight, long term may need 7.5 mg and 5 mg combination 2. Daily INR, CBC 3. Monitor s/sx of bleeding  Vincenza Hews, PharmD, BCPS 11/27/2015, 8:28 AM Pager: [phone]

## 2015-11-27 NOTE — Progress Notes (Signed)
PROGRESS NOTE  Briana [name] [id number] DOB: [birth year] DOA: 11/10/2015 PCP: Antionette Fairy, PA-C   LOS: 17 days   Brief Narrative: 70 y.o. female with past medical history significant for chronic diastolic CHF (in [id number] TEE with EF 55%), hypertension, dyslipidemia, atrial fibrillation on anticoagulation with Coumadin, recently hospitalized 5/6-5/24 of 2017 for pneumonia which was then complicated further by A.Fib RVR which was unstable and required cardioversion. She also had NSTEMI which was managed medically and no cardiac cath due to acute kidney failure. She was discharged to inpatient rehab.   Patient presented to [facility] with worsening shortness of breath over past 2 weeks prior to this admission, or extremity edema. She was seen by primary care physician the week prior to the admission and was instructed to take Lasix twice daily whereas before she was on 40 mg once a day Lasix. There was no significant symptomatic relief for which reason she presented to ED for further evaluation.   Hospital course complicated with worsening renal function and fluid overload for which nephrology was consulted. Patient was eventually moved to ICU and was on CRRT between 6/28 and 6/30 with significant improvement in her fluid status. Transferred to telemetry 6/30.   Assessment & Plan: Principal Problem:   Acute on chronic diastolic (congestive) heart failure (HCC) Active Problems:   Acute on chronic diastolic CHF (congestive heart failure) (HCC)   Controlled diabetes mellitus type 2 with complications (HCC)   CAD in native artery   Chronic kidney disease (CKD), stage IV (severe) (HCC)   Essential hypertension   Anemia of chronic kidney failure   Acute on chronic diastolic CHF  - patient did not respond well initially to diuresis requiring CRRT for 72 h 6/28 - 6/30. Nephrology and cardiology consulted.  - Weight since 6/23: 109.7 kg --> 109.2 kg --> 108.8 kg --> 109.3 kg --> 108.1  kg >> 104.4 >> 97 kg >> 91 /// CRRT stopped > weight now 94 kg >> 96 >> 96.5 - Did not respond to po Lasix with minimal UOP on 7/1 after CRRT, changed to IV Lasix on 7/2. UOP on IV Lasix 1.125, weight up 1 lb though - Continue daily weight and strict intake and output - Supplement electrolytes as needed  CKD stage 4 / Hyperkalemia  - Creatinine 3 weeks ago 2.75 - Renal following - BUN / Cr / weight continues to climb despite IV Lasix Lasix, defer to nephrology diuresis vs HD.   Hyponatremia - 129>>132, improved with IV Lasix  Anemia of chronic kidney disease stage 4 - Hemoglobin 7.6 >> 8.3 >> 8.8. Overall stable - repeat CBC tomorrow  Atrial fibrillation  - CHADS vasc score 5 - Recent TEE 09/2015 done for cardioversion - Continue amiodarone 200 mg daily and metoprolol 25 mg twice daily.  - Continue Coumadin, dosing per pharmacy. 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 - Continue 70/30 mix 15 units twice daily - Continue sliding-scale insulin - CBGs in past 24 hours: 123-173  Dyslipidemia associated with type 2 diabetes mellitus - Continue Lipitor 80 mg at bedtime - Continue daily aspirin   Essential hypertension - Continue Norvasc 10 mg daily, Bidil TID - hold metoprolol for now due to bradycardia   DVT prophylaxis: Coumadin Code Status: Full Family Communication: no family bedside Disposition Plan: TBD  Consultants:   Nephrology  Cardiology - s/o 6/30  Procedures:   CRRT  Antimicrobials:  None    Subjective: - she  is feeling well, no chest pain / palpitations / dyspnea  Objective: Filed Vitals:   11/26/15 2014 11/27/15 0545 11/27/15 0915 11/27/15 1132  BP: 114/36 146/36 127/34 135/56  Pulse: 60 87 58 58  Temp: 98.4 F (36.9 C) 98.2 F (36.8 C) 98 F (36.7 C) 98.6 F (37 C)  TempSrc: Oral Oral Oral Oral  Resp: 20 18 18 20   Height:      Weight:  96.525 kg (212 lb 12.8 oz)    SpO2: 97% 96% 98% 95%      Intake/Output Summary (Last 24 hours) at 11/27/15 1256 Last data filed at 11/27/15 1233  Gross per 24 hour  Intake   1060 ml  Output   1400 ml  Net   -340 ml   Filed Weights   11/25/15 0623 11/26/15 0500 11/27/15 0545  Weight: 94.394 kg (208 lb 1.6 oz) 96.072 kg (211 lb 12.8 oz) 96.525 kg (212 lb 12.8 oz)    Examination: Constitutional: NAD Filed Vitals:   11/26/15 2014 11/27/15 0545 11/27/15 0915 11/27/15 1132  BP: 114/36 146/36 127/34 135/56  Pulse: 60 87 58 58  Temp: 98.4 F (36.9 C) 98.2 F (36.8 C) 98 F (36.7 C) 98.6 F (37 C)  TempSrc: Oral Oral Oral Oral  Resp: 20 18 18 20   Height:      Weight:  96.525 kg (212 lb 12.8 oz)    SpO2: 97% 96% 98% 95%   Eyes: PERRL, lids and conjunctivae normal Respiratory: clear to auscultation bilaterally, no wheezing, no crackles. Normal respiratory effort. No accessory muscle use.  Cardiovascular: Regular rate and rhythm, no murmurs / rubs / gallops. 1+ pitting LE edema. Abdomen: no tenderness. Bowel sounds positive.  Musculoskeletal: no clubbing / cyanosis.  Neurologic: non focal   Data Reviewed: I have personally reviewed following labs and imaging studies  CBC:  Recent Labs Lab 11/21/15 0518 11/22/15 0507 11/23/15 0435 11/24/15 0042 11/24/15 0610 11/26/15 0435  WBC 7.3 6.9 7.6  --  8.4 9.8  HGB 7.8* 7.6* 8.3* 9.9* 9.7* 8.8*  HCT 25.3* 25.1* 27.8* 29.0* 32.3* 28.3*  MCV 87.5 87.8 91.1  --  91.2 87.6  PLT 236 227 242  --  279 [id number]   Basic Metabolic Panel:  Recent Labs Lab 11/23/15 0435  11/23/15 1620 11/24/15 0042 11/24/15 0610 11/25/15 0655 11/26/15 0435 11/27/15 0510  NA 140  < > 135 139 138  137 134* 129* 132*  K 5.2*  < > 5.2* 4.8 4.9  4.9 5.2* 5.1 4.4  CL 104  < > 101 103 102  102 100* 96* 89*  CO2 29  < > 27  --  26  26 23 23 22   GLUCOSE 113*  < > 185* 125* 121*  121* 104* 140* 166*  BUN 53*  < > 42* 35* 36*  36* 68* 84* 103*  CREATININE 1.62*  < > 1.82* 2.00* 2.11*  2.10* 3.34* 3.75*  4.18*  CALCIUM 8.7*  < > 9.1  --  8.9  8.8* 9.1 9.0 10.2  MG 2.5*  --   --   --  2.6* 2.6* 2.7* 2.7*  PHOS  --   < > 2.6  --  2.3* 3.3 4.3 6.0*  < > = values in this interval not displayed. GFR: Estimated Creatinine Clearance: 14.1 mL/min (by C-G formula based on Cr of 4.18). Liver Function Tests:  Recent Labs Lab 11/23/15 1620 11/24/15 0610 11/25/15 0655 11/26/15 0435 11/27/15 0510  AST  --   --   --  16  --   ALT  --   --   --  14  --   ALKPHOS  --   --   --  54  --   BILITOT  --   --   --  0.4  --   PROT  --   --   --  6.4*  --   ALBUMIN 3.5 3.6 3.3* 3.2* 3.4*   Coagulation Profile:  Recent Labs Lab 11/23/15 0435 11/24/15 0610 11/25/15 0655 11/26/15 0435 11/27/15 0510  INR 2.24* 2.10* 2.54* 2.96* 2.38*   CBG:  Recent Labs Lab 11/26/15 1135 11/26/15 1659 11/26/15 2050 11/27/15 0554 11/27/15 1130  GLUCAP 156* 182* 130* 173* 123*   Anemia Panel: No results for input(s): VITAMINB12, FOLATE, FERRITIN, TIBC, IRON, RETICCTPCT in the last 72 hours. Urine analysis:    Component Value Date/Time   COLORURINE YELLOW 11/10/2015 1521   APPEARANCEUR CLEAR 11/10/2015 1521   LABSPEC 1.013 11/10/2015 1521   PHURINE 5.5 11/10/2015 1521   GLUCOSEU NEGATIVE 11/10/2015 1521   HGBUR NEGATIVE 11/10/2015 1521   BILIRUBINUR NEGATIVE 11/10/2015 1521   KETONESUR NEGATIVE 11/10/2015 1521   PROTEINUR 100* 11/10/2015 1521   UROBILINOGEN 0.2 03/02/2014 1508   NITRITE NEGATIVE 11/10/2015 1521   LEUKOCYTESUR TRACE* 11/10/2015 1521   Sepsis Labs: Invalid input(s): PROCALCITONIN, LACTICIDVEN  Recent Results (from the past 240 hour(s))  MRSA PCR Screening     Status: None   Collection Time: 11/21/15  2:16 PM  Result Value Ref Range Status   MRSA by PCR NEGATIVE NEGATIVE Final    Comment:        The GeneXpert MRSA Assay (FDA approved for NASAL specimens only), is one component of a comprehensive MRSA colonization surveillance program. It is not intended to diagnose  MRSA infection nor to guide or monitor treatment for MRSA infections.     Radiology Studies: No results found. Scheduled Meds: . amiodarone  200 mg Oral Daily  . amLODipine  10 mg Oral Daily  . atorvastatin  80 mg Oral Daily  . bisacodyl  10 mg Rectal Once  . calcium acetate  1,334 mg Oral TID WC  . docusate sodium  100 mg Oral BID  . furosemide  60 mg Intravenous BID  . insulin aspart  0-15 Units Subcutaneous TID WC  . insulin aspart  0-5 Units Subcutaneous QHS  . insulin aspart protamine- aspart  15 Units Subcutaneous BID WC  . isosorbide-hydrALAZINE  2 tablet Oral TID  . loratadine  10 mg Oral Daily  . metolazone  2.5 mg Oral Daily  . polyethylene glycol  17 g Oral BID  . protein supplement  1 scoop Oral TID WC  . sodium chloride flush  10-40 mL Intracatheter Q12H  . sodium chloride flush  3 mL Intravenous Q12H  . warfarin  7.5 mg Oral ONCE-1800  . Warfarin - Pharmacist Dosing Inpatient   Does not apply q1800   Continuous Infusions:    Marzetta Board, MD, PhD [facility] Pager [phone]  If 7PM-7AM, please contact night-coverage [email or web address] Password [email or web address] 11/27/2015, 12:56 PM

## 2015-11-28 ENCOUNTER — Inpatient Hospital Stay (HOSPITAL_COMMUNITY): Payer: Medicare Other

## 2015-11-28 DIAGNOSIS — I770 Arteriovenous fistula, acquired: Secondary | ICD-10-CM

## 2015-11-28 LAB — GLUCOSE, CAPILLARY
GLUCOSE-CAPILLARY: 139 mg/dL — AB (ref 65–99)
Glucose-Capillary: 115 mg/dL — ABNORMAL HIGH (ref 65–99)
Glucose-Capillary: 221 mg/dL — ABNORMAL HIGH (ref 65–99)
Glucose-Capillary: 110 mg/dL — ABNORMAL HIGH (ref 65–99)

## 2015-11-28 LAB — RENAL FUNCTION PANEL
ALBUMIN: 3.4 g/dL — AB (ref 3.5–5.0)
Anion gap: 10 (ref 5–15)
BUN: 52 mg/dL — ABNORMAL HIGH (ref 6–20)
CALCIUM: 8 mg/dL — AB (ref 8.9–10.3)
CO2: 27 mmol/L (ref 22–32)
CREATININE: 2.81 mg/dL — AB (ref 0.44–1.00)
Chloride: 96 mmol/L — ABNORMAL LOW (ref 101–111)
GFR calc non Af Amer: 16 mL/min — ABNORMAL LOW (ref >60)
GFR, EST AFRICAN AMERICAN: 19 mL/min — AB (ref >60)
Glucose, Bld: 126 mg/dL — ABNORMAL HIGH (ref 65–99)
PHOSPHORUS: 3.9 mg/dL (ref 2.5–4.6)
Potassium: 3.5 mmol/L (ref 3.5–5.1)
SODIUM: 133 mmol/L — AB (ref 135–145)

## 2015-11-28 LAB — CBC
HCT: 28.6 % — ABNORMAL LOW (ref 36.0–46.0)
HEMOGLOBIN: 8.8 g/dL — AB (ref 12.0–15.0)
MCH: 26.7 pg (ref 26.0–34.0)
MCHC: 30.8 g/dL (ref 30.0–36.0)
MCV: 86.9 fL (ref 78.0–100.0)
PLATELETS: 269 10*3/uL (ref 150–400)
RBC: 3.29 MIL/uL — AB (ref 3.87–5.11)
RDW: 16.4 % — ABNORMAL HIGH (ref 11.5–15.5)
WBC: 8.8 10*3/uL (ref 4.0–10.5)

## 2015-11-28 LAB — MAGNESIUM: MAGNESIUM: 2.2 mg/dL (ref 1.7–2.4)

## 2015-11-28 LAB — HEPATITIS B SURFACE ANTIGEN: Hepatitis B Surface Ag: NEGATIVE

## 2015-11-28 LAB — PROTIME-INR
INR: 1.7 — AB (ref 0.00–1.49)
PROTHROMBIN TIME: 20 s — AB (ref 11.6–15.2)

## 2015-11-28 LAB — HEPARIN LEVEL (UNFRACTIONATED): Heparin Unfractionated: 0.39 IU/mL (ref 0.30–0.70)

## 2015-11-28 MED ORDER — HEPARIN (PORCINE) IN NACL 100-0.45 UNIT/ML-% IJ SOLN
1000.0000 [IU]/h | INTRAMUSCULAR | Status: DC
  Administered 2015-11-28: 1000 [IU]/h via INTRAVENOUS
  Filled 2015-11-28: qty 250

## 2015-11-28 MED ORDER — HEPARIN BOLUS VIA INFUSION
4000.0000 [IU] | Freq: Once | INTRAVENOUS | Status: AC
  Administered 2015-11-28: 4000 [IU] via INTRAVENOUS
  Filled 2015-11-28: qty 4000

## 2015-11-28 MED ORDER — HEPARIN (PORCINE) IN NACL 100-0.45 UNIT/ML-% IJ SOLN: 1000.0000 [IU]/h | INTRAMUSCULAR | Status: DC

## 2015-11-28 MED ORDER — HEPARIN BOLUS VIA INFUSION
4000.0000 [IU] | Freq: Once | INTRAVENOUS | Status: DC
  Filled 2015-11-28: qty 4000

## 2015-11-28 MED ORDER — RENA-VITE PO TABS
1.0000 | ORAL_TABLET | Freq: Every day | ORAL | Status: DC
  Administered 2015-11-28 – 2015-12-09 (×12): 1 via ORAL
  Filled 2015-11-28 (×12): qty 1

## 2015-11-28 MED ORDER — AMLODIPINE BESYLATE 5 MG PO TABS
5.0000 mg | ORAL_TABLET | Freq: Every day | ORAL | Status: DC
  Administered 2015-11-28 – 2015-12-08 (×10): 5 mg via ORAL
  Filled 2015-11-28 (×11): qty 1

## 2015-11-28 MED ORDER — HEPARIN (PORCINE) IN NACL 100-0.45 UNIT/ML-% IJ SOLN
1000.0000 [IU]/h | INTRAMUSCULAR | Status: DC
  Administered 2015-11-29: 1000 [IU]/h via INTRAVENOUS
  Filled 2015-11-28: qty 250

## 2015-11-28 NOTE — Progress Notes (Signed)
Fall risk assessment completed. Pt. Refusing bed alarm at this time. Pt. Verbalized understanding of fall safety plan. Pt. Stated she will call for assistance if needed. Briana [name], Briana [name]

## 2015-11-28 NOTE — Progress Notes (Signed)
ANTICOAGULATION CONSULT NOTE - Follow Up Consult  Pharmacy Consult for warfarin/heparin Indication: atrial fibrillation  Allergies  Allergen Reactions  . Carbamazepine Other (See Comments)    Reaction to tegretol - loopy  . Clonazepam Other (See Comments)    Unknown reaction  . Gemfibrozil Other (See Comments)    Patient is not aware of this allergy but states that she has side effects to a cholesterol medication in the past  . Macrobid [Nitrofurantoin] Nausea And Vomiting  . Oxycodone Other (See Comments)    hallucinations    Patient Measurements: Height: 5\' 4"  (162.6 cm) Weight: 209 lb 12.8 oz (95.165 kg) (Scale C) IBW/kg (Calculated) : 54.7 Heparin Dosing Weight: 76.4 kg  Vital Signs: Temp: 98.6 F (37 C) (07/04 0555) Temp Source: Oral (07/04 0555) BP: 156/47 mmHg (07/04 0555) Pulse Rate: 56 (07/04 0555)  Labs:  Recent Labs  11/26/15 0435 11/27/15 0510 11/27/15 1555 11/28/15 0428  HGB 8.8*  --  8.3* 8.8*  HCT 28.3*  --  25.6* 28.6*  PLT 257  --  232 269  LABPROT 30.3* 25.7*  --  20.0*  INR 2.96* 2.38*  --  1.70*  CREATININE 3.75* 4.18*  --  2.81*    Estimated Creatinine Clearance: 20.9 mL/min (by C-G formula based on Cr of 2.81).   Medications:  Prescriptions prior to admission  Medication Sig Dispense Refill Last Dose  . amiodarone (PACERONE) 200 MG tablet Take 1 tablet (200 mg total) by mouth daily. 30 tablet 1 11/10/2015 at Unknown time  . amLODipine (NORVASC) 10 MG tablet Take 1 tablet (10 mg total) by mouth daily. 30 tablet 0 11/10/2015 at Unknown time  . aspirin EC 81 MG EC tablet Take 1 tablet (81 mg total) by mouth daily. 30 tablet 1 11/10/2015 at Unknown time  . atorvastatin (LIPITOR) 80 MG tablet Take 1 tablet (80 mg total) by mouth daily. 30 tablet 0 11/10/2015 at Unknown time  . calcium acetate (PHOSLO) 667 MG capsule Take 2 capsules (1,334 mg total) by mouth 3 (three) times daily with meals. 180 capsule 1 11/10/2015 at Unknown time  . cetirizine  (ZYRTEC) 10 MG tablet Take 10 mg by mouth at bedtime.   11/09/2015 at Unknown time  . Cholecalciferol (VITAMIN D) 2000 units tablet Take 2,000 Units by mouth daily.   11/10/2015 at Unknown time  . furosemide (LASIX) 40 MG tablet Take 1 tablet (40 mg total) by mouth daily. (Patient taking differently: Take 40 mg by mouth 2 (two) times daily. ) 30 tablet 0 11/10/2015 at Unknown time  . HYDROcodone-acetaminophen (NORCO) 7.5-325 MG tablet Take 1 tablet by mouth every 4 (four) hours as needed for moderate pain (Must last 30 days.  Do not drive or operate machinery while taking this medicine.). 120 tablet 0 Past Week at Unknown time  . insulin NPH-regular Human (NOVOLIN 70/30) (70-30) 100 UNIT/ML injection Inject 15 Units into the skin 2 (two) times daily with a meal. 50 mL 0 11/10/2015 at Unknown time  . isosorbide-hydrALAZINE (BIDIL) 20-37.5 MG tablet Take 2 tablets by mouth 3 (three) times daily. 180 tablet 0 11/10/2015 at Unknown time  . metoprolol tartrate (LOPRESSOR) 25 MG tablet Take 1 tablet (25 mg total) by mouth 2 (two) times daily. 30 tablet 1 11/10/2015 at 0800  . nitroGLYCERIN (NITROSTAT) 0.4 MG SL tablet Place 1 tablet (0.4 mg total) under the tongue every 5 (five) minutes as needed for chest pain. Max three pills 45 tablet 0 unk at unk  . potassium chloride  SA (K-DUR,KLOR-CON) 20 MEQ tablet Take 20 mEq by mouth daily.   11/09/2015 at Unknown time  . protein supplement (RESOURCE BENEPROTEIN) POWD Take 6 g by mouth 3 (three) times daily with meals.  0 Past Week at Unknown time  . warfarin (COUMADIN) 5 MG tablet Take one pill on Mon, Wed, Fri, Sat, Sun. Take 1/2 pill on Tue and Thus (Patient taking differently: 2.5-5 mg See admin instructions. Take one pill on Mon, Wed, Fri, Sat, Sun. Take 1/2 pill on Tue and Thus) 30 tablet 0 11/09/2015 at 1800    Assessment: 70 yo F admitted 11/10/2015 on warfarin PTA for Afib. Currently holding warfarin and pharmacy is consulted to start heparin drip when INR <2 for  IR to place tunneled cath.  INR 1.70, Hgb 8.8, Plt 269, No s/sx of bleeding noted.  Goal of Therapy:  Heparin level 0.3-0.7 units/ml Monitor platelets by anticoagulation protocol: Yes   Plan:  - Continue to hold warfarin - Bolus heparin 4000 units - Start heparin drip at 1000 units/hr - Monitor daily HL, INR, CBC and s/sx of bleeding - Follow up after procedure for resumption of warfarin  Dimitri Ped, PharmD. PGY-2 Pharmacy Resident Pager: [phone] 11/28/2015,7:19 AM

## 2015-11-28 NOTE — Progress Notes (Signed)
PROGRESS NOTE  AIRIAL IMANI [name] DOB: Apr 11, 1946 DOA: 11/10/2015 PCP: Antionette Fairy, PA-C   LOS: 18 days   Brief Narrative: 70 y.o. female with past medical history significant for chronic diastolic CHF (in [id number] TEE with EF 55%), hypertension, dyslipidemia, atrial fibrillation on anticoagulation with Coumadin, recently hospitalized 5/6-5/24 of 2017 for pneumonia which was then complicated further by A.Fib RVR which was unstable and required cardioversion. She also had NSTEMI which was managed medically and no cardiac cath due to acute kidney failure. She was discharged to inpatient rehab.   Patient presented to [facility] with worsening shortness of breath over past 2 weeks prior to this admission, or extremity edema. She was seen by primary care physician the week prior to the admission and was instructed to take Lasix twice daily whereas before she was on 40 mg once a day Lasix. There was no significant symptomatic relief for which reason she presented to ED for further evaluation.   Hospital course complicated with worsening renal function and fluid overload for which nephrology was consulted. Patient was eventually moved to ICU and was on CRRT between 6/28 and 6/30 with significant improvement in her fluid status. Transferred to telemetry 6/30. Did not respond to diuresis and was becoming more fluid overload and was started on regular HD 7/3. Tunneled cath pending.   Assessment & Plan: Principal Problem:   Acute on chronic diastolic (congestive) heart failure (HCC) Active Problems:   Acute on chronic diastolic CHF (congestive heart failure) (HCC)   Controlled diabetes mellitus type 2 with complications (HCC)   CAD in native artery   Chronic kidney disease (CKD), stage IV (severe) (HCC)   Essential hypertension   Anemia of chronic kidney failure   Acute on chronic diastolic CHF  - patient did not respond well initially to diuresis requiring CRRT for 72 h 6/28 -  6/30.  - Weight since 6/23: 109.7 kg --> 109.2 kg --> 108.8 kg --> 109.3 kg --> 108.1 kg >> 104.4 >> 97 kg >> 91 /// CRRT stopped > weight 94 kg >> 96 >> 96.5 until 7/3 when HD started per nephrology. - Did not respond to po Lasix with minimal UOP on 7/1 after CRRT, changed to IV Lasix on 7/2, with that she had suboptimal UOP and was started on HD 7/3 - cardiology consulted, signed off  CKD stage 4 / Hyperkalemia  - Creatinine 3 weeks ago 2.75 - Renal following - BUN / Cr / weight continues to climb despite IV Lasix Lasix, had CRRT, did not respond to diuretics afterwards and had her first hemodialysis on 7/3 using temporary IJ catheter. She will undergo again hemodialysis today 7/4. Her Coumadin is on hold to allow normalization of her INR, and then she will need a tunneled HD cath and vascular evaluation. CLIP process underway.   Hyponatremia - 129>>132 >> 133, improved with HD  Anemia of chronic kidney disease stage 4 - Hemoglobin 7.6 >> 8.3 >> 8.8. Overall stable  Atrial fibrillation  - CHADS vasc score 5 - Recent TEE 09/2015 done for cardioversion - Continue amiodarone 200 mg daily and metoprolol 25 mg twice daily.  - Coumadin on hold pending tunneled cath, dosing per pharmacy. 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 - Continue 70/30 mix 15 units twice daily - Continue sliding-scale insulin - CBGs controlled 126-139  Dyslipidemia associated with  type 2 diabetes mellitus - Continue Lipitor 80 mg at bedtime - Continue daily aspirin   Essential hypertension - Continue Norvasc 10 mg daily, Bidil TID - hold metoprolol for now due to bradycardia   DVT prophylaxis: Coumadin Code Status: Full Family Communication: no family bedside Disposition Plan: TBD  Consultants:   Nephrology  Cardiology - s/o 6/30  Procedures:    CRRT  HD  Antimicrobials:  None    Subjective: - she is feeling well, no chest pain / palpitations / dyspnea. Seen in HD. Tolerating well  Objective: Filed Vitals:   11/28/15 1000 11/28/15 1030 11/28/15 1100 11/28/15 1110  BP: 136/61 113/56 137/62 125/57  Pulse: 54 52 52 55  Temp:    97.9 F (36.6 C)  TempSrc:    Oral  Resp: 15 13 15 16   Height:      Weight:    93.2 kg (205 lb 7.5 oz)  SpO2:        Intake/Output Summary (Last 24 hours) at 11/28/15 1133 Last data filed at 11/28/15 1110  Gross per 24 hour  Intake    820 ml  Output   5375 ml  Net  -4555 ml   Filed Weights   11/28/15 0555 11/28/15 0710 11/28/15 1110  Weight: 95.165 kg (209 lb 12.8 oz) 95.2 kg (209 lb 14.1 oz) 93.2 kg (205 lb 7.5 oz)    Examination: Constitutional: NAD Filed Vitals:   11/28/15 1000 11/28/15 1030 11/28/15 1100 11/28/15 1110  BP: 136/61 113/56 137/62 125/57  Pulse: 54 52 52 55  Temp:    97.9 F (36.6 C)  TempSrc:    Oral  Resp: 15 13 15 16   Height:      Weight:    93.2 kg (205 lb 7.5 oz)  SpO2:       Eyes: PERRL, lids and conjunctivae normal Respiratory: clear to auscultation bilaterally, no wheezing, no crackles. Normal respiratory effort. No accessory muscle use.  Cardiovascular: Regular rate and rhythm, no murmurs / rubs / gallops. Trace LE edema. Abdomen: no tenderness. Bowel sounds positive.  Musculoskeletal: no clubbing / cyanosis.  Neurologic: non focal   Data Reviewed: I have personally reviewed following labs and imaging studies  CBC:  Recent Labs Lab 11/23/15 0435 11/24/15 0042 11/24/15 0610 11/26/15 0435 11/27/15 1555 11/28/15 0428  WBC 7.6  --  8.4 9.8 8.2 8.8  HGB 8.3* 9.9* 9.7* 8.8* 8.3* 8.8*  HCT 27.8* 29.0* 32.3* 28.3* 25.6* 28.6*  MCV 91.1  --  91.2 87.6 85.3 86.9  PLT 242  --  279 257 232 [id number]   Basic Metabolic Panel:  Recent Labs Lab 11/24/15 0610 11/25/15 0655 11/26/15 0435 11/27/15 0510 11/28/15 0428  NA 138  137 134* 129* 132*  133*  K 4.9  4.9 5.2* 5.1 4.4 3.5  CL 102  102 100* 96* 89* 96*  CO2 26  26 23 23 22 27   GLUCOSE 121*  121* 104* 140* 166* 126*  BUN 36*  36* 68* 84* 103* 52*  CREATININE 2.11*  2.10* 3.34* 3.75* 4.18* 2.81*  CALCIUM 8.9  8.8* 9.1 9.0 10.2 8.0*  MG 2.6* 2.6* 2.7* 2.7* 2.2  PHOS 2.3* 3.3 4.3 6.0* 3.9   GFR: Estimated Creatinine Clearance: 20.6 mL/min (by C-G formula based on Cr of 2.81). Liver Function Tests:  Recent Labs Lab 11/24/15 0610 11/25/15 0655 11/26/15 0435 11/27/15 0510 11/28/15 0428  AST  --   --  16  --   --   ALT  --   --  14  --   --   ALKPHOS  --   --  54  --   --   BILITOT  --   --  0.4  --   --   PROT  --   --  6.4*  --   --   ALBUMIN 3.6 3.3* 3.2* 3.4* 3.4*   Coagulation Profile:  Recent Labs Lab 11/24/15 0610 11/25/15 0655 11/26/15 0435 11/27/15 0510 11/28/15 0428  INR 2.10* 2.54* 2.96* 2.38* 1.70*   CBG:  Recent Labs Lab 11/26/15 2050 11/27/15 0554 11/27/15 1130 11/27/15 2101 11/28/15 0600  GLUCAP 130* 173* 123* 217* 139*   Anemia Panel: No results for input(s): VITAMINB12, FOLATE, FERRITIN, TIBC, IRON, RETICCTPCT in the last 72 hours. Urine analysis:    Component Value Date/Time   COLORURINE YELLOW 11/10/2015 1521   APPEARANCEUR CLEAR 11/10/2015 1521   LABSPEC 1.013 11/10/2015 1521   PHURINE 5.5 11/10/2015 1521   GLUCOSEU NEGATIVE 11/10/2015 1521   HGBUR NEGATIVE 11/10/2015 1521   BILIRUBINUR NEGATIVE 11/10/2015 1521   KETONESUR NEGATIVE 11/10/2015 1521   PROTEINUR 100* 11/10/2015 1521   UROBILINOGEN 0.2 03/02/2014 1508   NITRITE NEGATIVE 11/10/2015 1521   LEUKOCYTESUR TRACE* 11/10/2015 1521   Sepsis Labs: Invalid input(s): PROCALCITONIN, LACTICIDVEN  Recent Results (from the past 240 hour(s))  MRSA PCR Screening     Status: None   Collection Time: 11/21/15  2:16 PM  Result Value Ref Range Status   MRSA by PCR NEGATIVE NEGATIVE Final    Comment:        The GeneXpert MRSA Assay (FDA approved for NASAL  specimens only), is one component of a comprehensive MRSA colonization surveillance program. It is not intended to diagnose MRSA infection nor to guide or monitor treatment for MRSA infections.     Radiology Studies: No results found. Scheduled Meds: . amiodarone  200 mg Oral Daily  . amLODipine  5 mg Oral QHS  . atorvastatin  80 mg Oral Daily  . bisacodyl  10 mg Rectal Once  . darbepoetin (ARANESP) injection - DIALYSIS  60 mcg Intravenous Q Mon-HD  . docusate sodium  100 mg Oral BID  . ferric gluconate (FERRLECIT/NULECIT) IV  125 mg Intravenous Daily  . insulin aspart  0-15 Units Subcutaneous TID WC  . insulin aspart  0-5 Units Subcutaneous QHS  . insulin aspart protamine- aspart  15 Units Subcutaneous BID WC  . loratadine  10 mg Oral Daily  . multivitamin  1 tablet Oral QHS  . polyethylene glycol  17 g Oral BID  . protein supplement  1 scoop Oral TID WC  . sevelamer carbonate  800 mg Oral TID WC  . sodium chloride flush  10-40 mL Intracatheter Q12H  . sodium chloride flush  3 mL Intravenous Q12H   Continuous Infusions: . heparin      Marzetta Board, MD, PhD [facility] Pager [phone]  If 7PM-7AM, please contact night-coverage [email or web address] Password [email or web address] 11/28/2015, 11:33 AM

## 2015-11-28 NOTE — Progress Notes (Signed)
Subjective: Interval History: has no complaint, still wants to go to ESRD mgmt..  Objective: Vital signs in last 24 hours: Temp:  [97.8 F (36.6 C)-98.6 F (37 C)] 98.4 F (36.9 C) (07/04 0710) Pulse Rate:  [50-74] 51 (07/04 0800) Resp:  [11-20] 14 (07/04 0800) BP: (96-174)/(34-74) 125/56 mmHg (07/04 0800) SpO2:  [95 %-98 %] 96 % (07/04 0710) Weight:  [95.165 kg (209 lb 12.8 oz)-97.6 kg (215 lb 2.7 oz)] 95.2 kg (209 lb 14.1 oz) (07/04 0710) Weight change: 1.074 kg (2 lb 5.9 oz)  Intake/Output from previous day: 07/03 0701 - 07/04 0700 In: 1160 [P.O.:1160] Out: 3875 [Urine:1875] Intake/Output this shift:    General appearance: alert, cooperative, moderately obese and pale Neck: R IJ cath Resp: diminished breath sounds bilaterally and rales bibasilar Cardio: S1, S2 normal and systolic murmur: holosystolic 2/6, blowing at apex GI: obese, pos bs, soft Extremities: edema 3+  Lab Results:  Recent Labs  11/27/15 1555 11/28/15 0428  WBC 8.2 8.8  HGB 8.3* 8.8*  HCT 25.6* 28.6*  PLT 232 269   BMET:  Recent Labs  11/27/15 0510 11/28/15 0428  NA 132* 133*  K 4.4 3.5  CL 89* 96*  CO2 22 27  GLUCOSE 166* 126*  BUN 103* 52*  CREATININE 4.18* 2.81*  CALCIUM 10.2 8.0*   No results for input(s): PTH in the last 72 hours. Iron Studies: No results for input(s): IRON, TIBC, TRANSFERRIN, FERRITIN in the last 72 hours.  Studies/Results: No results found.  I have reviewed the patient's current medications.  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  Vol xs. Lower with HD. CLIP 2 DM controlled 3 CAD 4 CHF improving with vol removal 5 Carotid dz 6 anemia on esa/Fe 7 Obesity 8 HPTH pending P HD, esa, Fe, accesses    LOS: 18 days   Bexleigh Theriault L 11/28/2015,8:32 AM

## 2015-11-28 NOTE — Procedures (Signed)
I was present at this session.  I have reviewed the session itself and made appropriate changes.  Cath flow limited, 300,  To get 2 L off.  tol well.    Cesario Weidinger L 7/4/20178:31 AM

## 2015-11-28 NOTE — Progress Notes (Signed)
Right  Upper Extremity Vein Map    Cephalic  Segment Diameter Depth Comment  1. Axilla 3.28mm 15.14mm   2. Mid upper arm 3.17mm 17.56mm   3. Above [facility] 4.80mm 6.108mm   4. In [facility] 4.77mm 6.68mm   5. Below AC 4.54mm 12.41mm Multiple branches  6. Mid forearm 3.4mm 8.63mm   7. Wrist 3.76mm 3.91mm                   Basilic Not evaluated.  Left Upper Extremity Vein Map    Cephalic  Segment Diameter Depth Comment  1. Axilla 1.50mm 5.61mm   2. Mid upper arm 1.13mm 8.54mm   3. Above [facility]   Unable to visualize  4. In [facility]   Unable to visualize  5. Below AC   Unable to visualize  6. Mid forearm   Unable to visualize  7. Wrist   Unable to visualize                  Basilic  Segment Diameter Depth Comment  1. Axilla     2. Mid upper arm     3. Above AC 6.106mm 28.96mm   4. In [facility] 6.22mm 38mm   5. Below AC 5.77mm 19mm   6. Mid forearm 4.30mm 10.72mm   7. Wrist 3.74mm 2.8mm                   11/28/2015 3:40 PM Maudry Mayhew, RVT, RDCS, RDMS

## 2015-11-28 NOTE — Progress Notes (Signed)
PT Cancellation Note  Patient Details Name: Briana [name] MRN: [id number] DOB: 1945/08/06   Cancelled Treatment:    Reason Eval/Treat Not Completed: Fatigue/lethargy limiting ability to participate; patient reports too fatigued after dialysis this morning to participate in PT.  Planned to return later to attempt again, but noted pt going off floor to vascular lab.  Will attempt again another day.   Reginia Naas 11/28/2015, 2:48 PM  Magda Kiel, [place] 11/28/2015

## 2015-11-28 NOTE — Progress Notes (Signed)
ANTICOAGULATION CONSULT NOTE - Follow Up Consult  Pharmacy Consult for heparin Indication: atrial fibrillation  Allergies  Allergen Reactions  . Carbamazepine Other (See Comments)    Reaction to tegretol - loopy  . Clonazepam Other (See Comments)    Unknown reaction  . Gemfibrozil Other (See Comments)    Patient is not aware of this allergy but states that she has side effects to a cholesterol medication in the past  . Macrobid [Nitrofurantoin] Nausea And Vomiting  . Oxycodone Other (See Comments)    hallucinations    Patient Measurements: Height: 5\' 4"  (162.6 cm) Weight: 205 lb 7.5 oz (93.2 kg) IBW/kg (Calculated) : 54.7 Heparin Dosing Weight: 76.4 kg  Vital Signs: Temp: 98.5 [name] (36.9 C) (07/04 1216) Temp Source: Oral (07/04 1216) BP: 119/40 mmHg (07/04 1216) Pulse Rate: 53 (07/04 1216)  Labs:  Recent Labs  11/26/15 0435 11/27/15 0510 11/27/15 1555 11/28/15 0428 11/28/15 1824  HGB 8.8*  --  8.3* 8.8*  --   HCT 28.3*  --  25.6* 28.6*  --   PLT 257  --  232 269  --   LABPROT 30.3* 25.7*  --  20.0*  --   INR 2.96* 2.38*  --  1.70*  --   HEPARINUNFRC  --   --   --   --  0.39  CREATININE 3.75* 4.18*  --  2.81*  --     Estimated Creatinine Clearance: 20.6 mL/min (by C-G formula based on Cr of 2.81).   Medications:  Prescriptions prior to admission  Medication Sig Dispense Refill Last Dose  . amiodarone (PACERONE) 200 MG tablet Take 1 tablet (200 mg total) by mouth daily. 30 tablet 1 11/10/2015 at Unknown time  . amLODipine (NORVASC) 10 MG tablet Take 1 tablet (10 mg total) by mouth daily. 30 tablet 0 11/10/2015 at Unknown time  . aspirin EC 81 MG EC tablet Take 1 tablet (81 mg total) by mouth daily. 30 tablet 1 11/10/2015 at Unknown time  . atorvastatin (LIPITOR) 80 MG tablet Take 1 tablet (80 mg total) by mouth daily. 30 tablet 0 11/10/2015 at Unknown time  . calcium acetate (PHOSLO) 667 MG capsule Take 2 capsules (1,334 mg total) by mouth 3 (three) times daily with  meals. 180 capsule 1 11/10/2015 at Unknown time  . cetirizine (ZYRTEC) 10 MG tablet Take 10 mg by mouth at bedtime.   11/09/2015 at Unknown time  . Cholecalciferol (VITAMIN D) 2000 units tablet Take 2,000 Units by mouth daily.   11/10/2015 at Unknown time  . furosemide (LASIX) 40 MG tablet Take 1 tablet (40 mg total) by mouth daily. (Patient taking differently: Take 40 mg by mouth 2 (two) times daily. ) 30 tablet 0 11/10/2015 at Unknown time  . HYDROcodone-acetaminophen (NORCO) 7.5-325 MG tablet Take 1 tablet by mouth every 4 (four) hours as needed for moderate pain (Must last 30 days.  Do not drive or operate machinery while taking this medicine.). 120 tablet 0 Past Week at Unknown time  . insulin NPH-regular Human (NOVOLIN 70/30) (70-30) 100 UNIT/ML injection Inject 15 Units into the skin 2 (two) times daily with a meal. 50 mL 0 11/10/2015 at Unknown time  . isosorbide-hydrALAZINE (BIDIL) 20-37.5 MG tablet Take 2 tablets by mouth 3 (three) times daily. 180 tablet 0 11/10/2015 at Unknown time  . metoprolol tartrate (LOPRESSOR) 25 MG tablet Take 1 tablet (25 mg total) by mouth 2 (two) times daily. 30 tablet 1 11/10/2015 at 0800  . nitroGLYCERIN (NITROSTAT) 0.4 MG  SL tablet Place 1 tablet (0.4 mg total) under the tongue every 5 (five) minutes as needed for chest pain. Max three pills 45 tablet 0 unk at unk  . potassium chloride SA (K-DUR,KLOR-CON) 20 MEQ tablet Take 20 mEq by mouth daily.   11/09/2015 at Unknown time  . protein supplement (RESOURCE BENEPROTEIN) POWD Take 6 g by mouth 3 (three) times daily with meals.  0 Past Week at Unknown time  . warfarin (COUMADIN) 5 MG tablet Take one pill on Mon, Wed, Fri, Sat, Sun. Take 1/2 pill on Tue and Thus (Patient taking differently: 2.5-5 mg See admin instructions. Take one pill on Mon, Wed, Fri, Sat, Sun. Take 1/2 pill on Tue and Thus) 30 tablet 0 11/09/2015 at 1800    Assessment: 70 yo [name] admitted 11/10/2015 on warfarin PTA for Afib. Currently holding warfarin  and pharmacy is consulted to start heparin drip when INR <2 for IR to place tunneled cath.  INR 1.70, Hgb 8.8, Plt 269, No s/sx of bleeding noted.  PM heparin level therapeutic  Goal of Therapy:  Heparin level 0.3-0.7 units/ml Monitor platelets by anticoagulation protocol: Yes   Plan:  - Continue heparin at 1000 units / hr - Follow up AM labs - Follow up after procedure for resumption of warfarin  Thank you Anette Guarneri, PharmD [phone]  11/28/2015,7:15 PM

## 2015-11-29 ENCOUNTER — Encounter (HOSPITAL_COMMUNITY): Payer: Self-pay | Admitting: Radiology

## 2015-11-29 ENCOUNTER — Inpatient Hospital Stay (HOSPITAL_COMMUNITY): Payer: Medicare Other

## 2015-11-29 DIAGNOSIS — N186 End stage renal disease: Secondary | ICD-10-CM

## 2015-11-29 LAB — MAGNESIUM: Magnesium: 2.2 mg/dL (ref 1.7–2.4)

## 2015-11-29 LAB — GLUCOSE, CAPILLARY
GLUCOSE-CAPILLARY: 132 mg/dL — AB (ref 65–99)
GLUCOSE-CAPILLARY: 113 mg/dL — AB (ref 65–99)
Glucose-Capillary: 98 mg/dL (ref 65–99)
Glucose-Capillary: 236 mg/dL — ABNORMAL HIGH (ref 65–99)

## 2015-11-29 LAB — RENAL FUNCTION PANEL
ANION GAP: 9 (ref 5–15)
Albumin: 3.2 g/dL — ABNORMAL LOW (ref 3.5–5.0)
BUN: 26 mg/dL — ABNORMAL HIGH (ref 6–20)
CHLORIDE: 98 mmol/L — AB (ref 101–111)
CO2: 27 mmol/L (ref 22–32)
Calcium: 8.1 mg/dL — ABNORMAL LOW (ref 8.9–10.3)
Creatinine, Ser: 2.25 mg/dL — ABNORMAL HIGH (ref 0.44–1.00)
GFR calc non Af Amer: 21 mL/min — ABNORMAL LOW (ref >60)
GFR, EST AFRICAN AMERICAN: 24 mL/min — AB (ref >60)
GLUCOSE: 103 mg/dL — AB (ref 65–99)
Phosphorus: 3.1 mg/dL (ref 2.5–4.6)
Potassium: 3.6 mmol/L (ref 3.5–5.1)
Sodium: 134 mmol/L — ABNORMAL LOW (ref 135–145)

## 2015-11-29 LAB — CBC
HCT: 30.5 % — ABNORMAL LOW (ref 36.0–46.0)
Hemoglobin: 9.4 g/dL — ABNORMAL LOW (ref 12.0–15.0)
MCH: 27.5 pg (ref 26.0–34.0)
MCHC: 30.8 g/dL (ref 30.0–36.0)
MCV: 89.2 fL (ref 78.0–100.0)
PLATELETS: 255 10*3/uL (ref 150–400)
RBC: 3.42 MIL/uL — ABNORMAL LOW (ref 3.87–5.11)
RDW: 17 % — AB (ref 11.5–15.5)
WBC: 11.2 10*3/uL — AB (ref 4.0–10.5)

## 2015-11-29 LAB — PARATHYROID HORMONE, INTACT (NO CA): PTH: 87 pg/mL — AB (ref 15–65)

## 2015-11-29 LAB — HEPARIN LEVEL (UNFRACTIONATED): HEPARIN UNFRACTIONATED: 0.36 [IU]/mL (ref 0.30–0.70)

## 2015-11-29 LAB — PROTIME-INR
INR: 1.49 (ref 0.00–1.49)
Prothrombin Time: 18.1 seconds — ABNORMAL HIGH (ref 11.6–15.2)

## 2015-11-29 MED ORDER — SODIUM CHLORIDE 0.9% FLUSH
10.0000 mL | Freq: Two times a day (BID) | INTRAVENOUS | Status: DC
  Administered 2015-12-08: 10 mL

## 2015-11-29 MED ORDER — CEFAZOLIN SODIUM-DEXTROSE 2-4 GM/100ML-% IV SOLN
INTRAVENOUS | Status: AC
  Filled 2015-11-29: qty 100

## 2015-11-29 MED ORDER — HEPARIN SODIUM (PORCINE) 1000 UNIT/ML IJ SOLN
INTRAMUSCULAR | Status: AC
  Administered 2015-11-29: 3.8 [IU]
  Filled 2015-11-29: qty 1

## 2015-11-29 MED ORDER — MIDAZOLAM HCL 2 MG/2ML IJ SOLN
INTRAMUSCULAR | Status: AC
  Filled 2015-11-29: qty 2

## 2015-11-29 MED ORDER — CEFAZOLIN IN D5W 1 GM/50ML IV SOLN
1.0000 g | Freq: Once | INTRAVENOUS | Status: DC
  Filled 2015-11-29: qty 50

## 2015-11-29 MED ORDER — CEFAZOLIN SODIUM-DEXTROSE 2-4 GM/100ML-% IV SOLN
2.0000 g | Freq: Three times a day (TID) | INTRAVENOUS | Status: DC
  Administered 2015-11-29: 2 g via INTRAVENOUS
  Filled 2015-11-29 (×2): qty 100

## 2015-11-29 MED ORDER — FENTANYL CITRATE (PF) 100 MCG/2ML IJ SOLN
INTRAMUSCULAR | Status: AC | PRN
  Administered 2015-11-29: 25 ug via INTRAVENOUS

## 2015-11-29 MED ORDER — LIDOCAINE HCL 1 % IJ SOLN
INTRAMUSCULAR | Status: AC
  Administered 2015-11-29: 20 mL
  Filled 2015-11-29: qty 20

## 2015-11-29 MED ORDER — FENTANYL CITRATE (PF) 100 MCG/2ML IJ SOLN
INTRAMUSCULAR | Status: AC
  Filled 2015-11-29: qty 2

## 2015-11-29 MED ORDER — CEFAZOLIN SODIUM-DEXTROSE 2-4 GM/100ML-% IV SOLN: 2.0000 g | INTRAVENOUS | Status: DC

## 2015-11-29 MED ORDER — HEPARIN (PORCINE) IN NACL 100-0.45 UNIT/ML-% IJ SOLN
1200.0000 [IU]/h | INTRAMUSCULAR | Status: DC
  Administered 2015-11-29: 1050 [IU]/h via INTRAVENOUS
  Administered 2015-11-30 – 2015-12-01 (×2): 1100 [IU]/h via INTRAVENOUS
  Administered 2015-12-03: 1200 [IU]/h via INTRAVENOUS
  Filled 2015-11-29 (×6): qty 250

## 2015-11-29 MED ORDER — MIDAZOLAM HCL 2 MG/2ML IJ SOLN
INTRAMUSCULAR | Status: AC | PRN
  Administered 2015-11-29: 1 mg via INTRAVENOUS

## 2015-11-29 MED ORDER — SODIUM CHLORIDE 0.9% FLUSH: 10.0000 mL | INTRAVENOUS | Status: DC | PRN

## 2015-11-29 NOTE — Sedation Documentation (Signed)
Patient is resting comfortably. 

## 2015-11-29 NOTE — Procedures (Signed)
Interventional Radiology Procedure Note  Procedure: Placement of a right IJ approach tunneled HD catheter.  Tip is positioned at the superior cavoatrial junction and catheter is ready for immediate use.  Complications: None Recommendations:  - Ok to shower tomorrow - Do not submerge for 7 days - Routine line care  - OK to use catheter  Signed,  Dulcy Fanny. Earleen Newport, DO

## 2015-11-29 NOTE — Consult Note (Signed)
Chief Complaint: Patient was seen in consultation today for tunneled hemodialysis catheter placement Chief Complaint  Patient presents with  . Leg Swelling  . Hypoxia with Standing    at the request of Dr Jamal Maes  Referring Physician(s): Dr Jamal Maes  Supervising Physician: Corrie Mckusick  Patient Status: Inpatient  History of Present Illness: Briana [name] is a 70 y.o. 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 Diabetes mellitus   . Hypertension   . Hyperlipidemia   . Heart murmur   . Peripheral vascular disease ([place])   . Arthritis   . Depression with anxiety   . Carotid artery occlusion   . Chronic kidney disease   . Neuropathy ([place])   . CAD (coronary artery disease)   . Rheumatic fever   . Sleep apnea   . PAD (peripheral artery disease) ([place])   . Adenomatous colon polyp 09/2007  . Atrial fibrillation with rapid ventricular response ([place]) 10/05/2015    Past Surgical History  Procedure Laterality Date  . Coronary artery bypass graft  2005  . Carotid endarterectomy  10/04/2009    Right CEA  . Tubal ligation    . Polypectomy  02/25/2012    Procedure: POLYPECTOMY;  Surgeon: Danie Binder, MD;  Location: [facility];  Service: Endoscopy;  Laterality: N/A;    Allergies: Carbamazepine; Clonazepam; Gemfibrozil; Macrobid; and Oxycodone  Medications: Prior to Admission medications   Medication Sig Start Date End Date Taking? Authorizing Provider  amiodarone (PACERONE) 200 MG tablet Take 1 tablet (200 mg total) by mouth daily. 10/24/15  Yes Ivan Anchors Love, PA-C  amLODipine (NORVASC) 10 MG tablet Take 1 tablet (10 mg total) by mouth daily. 10/24/15  Yes Bary Leriche, PA-C  aspirin EC 81 MG EC tablet  Take 1 tablet (81 mg total) by mouth daily. 10/18/15  Yes Reyne Dumas, MD  atorvastatin (LIPITOR) 80 MG tablet Take 1 tablet (80 mg total) by mouth daily. 10/24/15  Yes Ivan Anchors Love, PA-C  calcium acetate (PHOSLO) 667 MG capsule Take 2 capsules (1,334 mg total) by mouth 3 (three) times daily with meals. 10/24/15  Yes Ivan Anchors Love, PA-C  cetirizine (ZYRTEC) 10 MG tablet Take 10 mg by mouth at bedtime.   Yes Historical Provider, MD  Cholecalciferol (VITAMIN D) 2000 units tablet Take 2,000 Units by mouth daily.   Yes Historical Provider, MD  furosemide (LASIX) 40 MG tablet Take 1 tablet (40 mg total) by mouth daily. Patient taking differently: Take 40 mg by mouth 2 (two) times daily.  10/24/15  Yes Ivan Anchors Love, PA-C  HYDROcodone-acetaminophen (NORCO) 7.5-325 MG tablet Take 1 tablet by mouth every 4 (four) hours as needed for moderate pain (Must last 30 days.  Do not drive or operate machinery while taking this medicine.). 08/17/15  Yes Sanjuana Kava, MD  insulin NPH-regular Human (NOVOLIN 70/30) (70-30) 100 UNIT/ML injection Inject 15 Units into the skin 2 (two) times daily with a meal. 10/24/15  Yes Ivan Anchors Love, PA-C  isosorbide-hydrALAZINE (BIDIL) 20-37.5 MG tablet Take 2 tablets by mouth 3 (three) times daily. 10/24/15  Yes Ivan Anchors Love, PA-C  metoprolol tartrate (LOPRESSOR) 25 MG tablet Take 1 tablet (25 mg total) by mouth 2 (two) times daily. 10/24/15  Yes Ivan Anchors Love, PA-C  nitroGLYCERIN (NITROSTAT) 0.4 MG SL tablet Place 1 tablet (0.4 mg total) under the tongue every 5 (five) minutes as needed for chest pain. Max three pills 10/24/15  Yes Ivan Anchors Love, PA-C  potassium chloride SA (K-DUR,KLOR-CON) 20 MEQ tablet Take 20 mEq by mouth daily.   Yes Historical Provider, MD  protein supplement (RESOURCE BENEPROTEIN) POWD Take 6 g by mouth 3 (three) times daily with meals. 10/24/15  Yes Ivan Anchors Love, PA-C  warfarin (COUMADIN) 5 MG tablet Take one pill on Mon, Wed, Fri, Sat, Sun. Take 1/2 pill on Tue and  Thus Patient taking differently: 2.5-5 mg See admin instructions. Take one pill on Mon, Wed, Fri, Sat, Sun. Take 1/2 pill on Tue and Thus 10/25/15  Yes Bary Leriche, PA-C     Family History  Problem Relation Age of Onset  . Breast cancer Mother   . Heart disease Father   . Diabetes Father   . Heart disease Brother     Social History   Social History  . Marital Status: Married    Spouse Name: N/A  . Number of Children: N/A  . Years of Education: N/A   Social History Main Topics  . Smoking status: Never Smoker   . Smokeless tobacco: Never Used  . Alcohol Use: No  . Drug Use: No  . Sexual Activity: Not Asked   Other Topics Concern  . None   Social History Narrative     Review of Systems: A 12 point ROS discussed and pertinent positives are indicated in the HPI above.  All other systems are negative.  Review of Systems  Constitutional: Positive for activity change and fatigue. Negative for fever.  Respiratory: Positive for shortness of breath.   Gastrointestinal: Negative for abdominal pain.  Neurological: Positive for weakness.  Psychiatric/Behavioral: Negative for behavioral problems and confusion.    Vital Signs: BP 157/42 mmHg  Pulse 52  Temp(Src) 98.2 F (36.8 C) (Oral)  Resp 18  Ht 5\' 4"  (1.626 m)  Wt 207 lb (93.895 kg)  BMI 35.51 kg/m2  SpO2 96%  Physical Exam  Constitutional: She is oriented to person, place, and time.  Cardiovascular: Normal rate, regular rhythm and normal heart sounds.   Pulmonary/Chest: Effort normal. She has wheezes.  Abdominal: Soft. Bowel sounds are normal.  Musculoskeletal: Normal range of motion.  Neurological: She is alert and oriented to person, place, and time.  Skin: Skin is warm and dry.  Psychiatric: She has a normal mood and affect. Her behavior is normal. Judgment and thought content normal.  Nursing note and vitals reviewed.   Mallampati Score:  MD Evaluation Airway: WNL Heart: WNL Abdomen: WNL ASA   Classification: 3 Mallampati/Airway Score: One  Imaging: Dg Chest 2 View  11/10/2015  CLINICAL DATA:  Shortness of breath EXAM: CHEST  2 VIEW COMPARISON:  10/11/2015 FINDINGS: Chronic cardiopericardial enlargement. Dialysis catheter has been removed. Prominence of the pulmonary arterial contour, chronic. Pulmonary venous congestion that is similar to prior. Trace left pleural effusion. Focal opacity at the medial right base with new obscuration of the medial diaphragm. This has a streaky appearance in the lateral projection. Prior median sternotomy for CABG. IMPRESSION: Mild CHF. Atelectasis or bronchopneumonia at the medial right base. Electronically Signed   By: Monte Fantasia M.D.   On: 11/10/2015 15:12   Dg Chest Port 1 View  11/22/2015  CLINICAL DATA:  70 year old female with dialysis catheter placement EXAM: PORTABLE CHEST 1 VIEW COMPARISON:  Chest radiograph dated 11/10/2015 FINDINGS: There has been interval placement of a right IJ dialysis catheter with tip at the cavoatrial junction. There is cardiomegaly with mild vascular congestion. Bibasilar linear and platelike atelectasis/ scarring. Focal density at the right lung base likely represent atelectasis/scarring. Infiltrate is not excluded. Clinical correlation is recommended. Median sternotomy wires. No acute osseous pathology. IMPRESSION: Interval placement of a right IJ dialysis catheter with tip at the cavoatrial junction. No pneumothorax. Cardiomegaly with mild congestive changes. Bibasilar atelectatic changes. Infiltrate less likely. Clinical correlation is recommended. Electronically Signed   By: Anner Crete M.D.   On: 11/22/2015 00:26    Labs:  CBC:  Recent Labs  11/26/15 0435 11/27/15 1555 11/28/15 0428 11/29/15 0312  WBC 9.8 8.2 8.8 11.2*  HGB 8.8* 8.3* 8.8* 9.4*  HCT 28.3* 25.6* 28.6* 30.5*  PLT 257 232 269 255    COAGS:  Recent Labs  11/26/15 0435 11/27/15 0510 11/28/15 0428 11/29/15 0312  INR 2.96* 2.38*  1.70* 1.49    BMP:  Recent Labs  11/26/15 0435 11/27/15 0510 11/28/15 0428 11/29/15 0312  NA 129* 132* 133* 134*  K 5.1 4.4 3.5 3.6  CL 96* 89* 96* 98*  CO2 23 22 27 27   GLUCOSE 140* 166* 126* 103*  BUN 84* 103* 52* 26*  CALCIUM 9.0 10.2 8.0* 8.1*  CREATININE 3.75* 4.18* 2.81* 2.25*  GFRNONAA 11* 10* 16* 21*  GFRAA 13* 11* 19* 24*    LIVER FUNCTION TESTS:  Recent Labs  10/07/15 0312  10/19/15 0444  11/10/15 1647  11/26/15 0435 11/27/15 0510 11/28/15 0428 11/29/15 0312  BILITOT 0.3  --  0.4  --  0.4  --  0.4  --   --   --   AST 30  --  17  --  16  --  16  --   --   --   ALT 29  --  18  --  15  --  14  --   --   --   ALKPHOS 111  --  65  --  63  --  54  --   --   --   PROT 6.1*  --  5.8*  --  6.6  --  6.4*  --   --   --   ALBUMIN 2.4*  < > 2.7*  < > 3.5  < > 3.2* 3.4* 3.4* 3.2*  < > = values in this interval not displayed.  TUMOR MARKERS: No results for input(s): AFPTM, CEA, CA199, CHROMGRNA in the last 8760 hours.  Assessment and Plan:  Acute kidney injury on chronic renal disease stage 4 Temp dialysis placed 6/26 Worsening Cr Request for tunneled catheter for continued dialysis Risks and Benefits discussed with the patient including, but not limited to bleeding, infection, vascular injury, pneumothorax which may require chest tube placement, air embolism or even death All of the patient's questions were answered, patient is agreeable to proceed. Consent signed and in chart.   Thank you for this interesting consult.  I greatly enjoyed meeting [employer] and look forward to participating in their care.  A copy of this report was sent to the requesting provider on this date.  Electronically Signed: Tamaria Dunleavy A 11/29/2015, 9:47 AM   I spent a total of 20 Minutes    in face to face in clinical consultation, greater than 50% of which was counseling/coordinating care for tunneled HD catheter

## 2015-11-29 NOTE — Progress Notes (Signed)
Patient ID: Briana [name], female   DOB: 05-29-1945, 70 y.o.   MRN: [id number]  [facility] Progress Note   Assessment/ Plan:   1. Acute exacerbation of diastolic CHF: Improving with ultrafiltration at hemodialysis after failure to respond to diuretic therapy. Clinically, appears to be close to a full imaging with improvement of shortness of breath. 2. ESRD: With acute kidney injury on chronic kidney disease stage IV--now corrected to end-stage renal disease on chronic hemodialysis in the process is underway for outpatient dialysis unit placement. Awaiting conversion of temporary dialysis catheter to tunneled dialysis catheter and evaluation and placement of permanent dialysis access by vascular surgery. She states that she would like to undergo chronic hemodialysis in [place], [place] 3. Anemia: Hemoglobin slightly better-continue intravenous iron to replace iron deficiency and continue ESA. 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 Nutrition: Albumin is low-continue encouraging lean protein intake/oral nutritional supplements 6. Hypertension: Blood pressure is marginally elevated-anticipate will improve with ultrafiltration at hemodialysis  Subjective:   Reports to be feeling fair-states that she felt unusually weak after dialysis yesterday but notes that shortness of breath is much better.    Objective:   BP 157/42 mmHg  Pulse 52  Temp(Src) 98.2 F (36.8 C) (Oral)  Resp 18  Ht 5\' 4"  (1.626 m)  Wt 93.895 kg (207 lb)  BMI 35.51 kg/m2  SpO2 96%  Physical Exam: Gen: Comfortable resting in bed CVS: Pulse regular bradycardia, S1 and S2 normal Resp: Decreased breath sounds over bases-no distinct rales or rhonchi Abd: Soft, obese, nontender Ext: Trace lower extremity edema  Labs: BMET  Recent Labs Lab 11/23/15 1620 11/24/15 0042 11/24/15 0610 11/25/15 [id number] 11/26/15 0435 11/27/15 0510  11/28/15 0428 11/29/15 0312  NA 135 139 138  137 134* 129* 132* 133* 134*  K 5.2* 4.8 4.9  4.9 5.2* 5.1 4.4 3.5 3.6  CL 101 103 102  102 100* 96* 89* 96* 98*  CO2 27  --  26  26 23 23 22 27 27   GLUCOSE 185* 125* 121*  121* 104* 140* 166* 126* 103*  BUN 42* 35* 36*  36* 68* 84* 103* 52* 26*  CREATININE 1.82* 2.00* 2.11*  2.10* 3.34* 3.75* 4.18* 2.81* 2.25*  CALCIUM 9.1  --  8.9  8.8* 9.1 9.0 10.2 8.0* 8.1*  PHOS 2.6  --  2.3* 3.3 4.3 6.0* 3.9 3.1   CBC  Recent Labs Lab 11/26/15 0435 11/27/15 1555 11/28/15 0428 11/29/15 0312  WBC 9.8 8.2 8.8 11.2*  HGB 8.8* 8.3* 8.8* 9.4*  HCT 28.3* 25.6* 28.6* 30.5*  MCV 87.6 85.3 86.9 89.2  PLT 257 232 269 255   Medications:    . amiodarone  200 mg Oral Daily  . amLODipine  5 mg Oral QHS  . atorvastatin  80 mg Oral Daily  . bisacodyl  10 mg Rectal Once  . darbepoetin (ARANESP) injection - DIALYSIS  60 mcg Intravenous Q Mon-HD  . docusate sodium  100 mg Oral BID  . ferric gluconate (FERRLECIT/NULECIT) IV  125 mg Intravenous Daily  . insulin aspart  0-15 Units Subcutaneous TID WC  . insulin aspart  0-5 Units Subcutaneous QHS  . insulin aspart protamine- aspart  15 Units Subcutaneous BID WC  . loratadine  10 mg Oral Daily  . multivitamin  1 tablet Oral QHS  . polyethylene glycol  17 g Oral BID  . protein supplement  1 scoop Oral TID WC  .  sevelamer carbonate  800 mg Oral TID WC  . sodium chloride flush  10-40 mL Intracatheter Q12H  . sodium chloride flush  3 mL Intravenous Q12H   Elmarie Shiley, MD 11/29/2015, 8:34 AM

## 2015-11-29 NOTE — Consult Note (Signed)
Hospital Consult    Reason for Consult:  Permanent HD access Referring Physician:  Florene Glen MRN #:  [id number]  History of Present Illness: This is a 70 y.o. female who presented to the hospital on 6./19/17 with worsening SOB, peripheral edema, orthopnea, DOE.  While in the ER, she was found to be fluid overloaded with peripheral edema and CHF on CXR.  She is followed by Dr. Birdie Sons for her renal disease.  She had a TDC placed by IR today (11/29/15)  In May, she was hospitalized with NSTEMI, CHF, AFib that required DCCV.  She was also placed on amiodarone.   She is on heparin for her Afib and will need to be bridged back to coumadin once her vascular procedures are complete.    She did receive HD yesterday and scheduled to have HD tomorrow.  She is having a good response to the HD.  She wishes to have her outpatient dialysis in [place].     She is on insulin for diabetes.   She is on Norvasc for blood pressure control. Her beta blocker is being held at this time.  She is on a statin for cholesterol management.   Past Medical History  Diagnosis Date  . Diabetes mellitus   . Hypertension   . Hyperlipidemia   . Heart murmur   . Peripheral vascular disease ([place])   . Arthritis   . Depression with anxiety   . Carotid artery occlusion   . Chronic kidney disease   . Neuropathy ([place])   . CAD (coronary artery disease)   . Rheumatic fever   . Sleep apnea   . PAD (peripheral artery disease) ([place])   . Adenomatous colon polyp 09/2007  . Atrial fibrillation with rapid ventricular response ([place]) 10/05/2015    Past Surgical History  Procedure Laterality Date  . Coronary artery bypass graft  2005  . Carotid endarterectomy  10/04/2009    Right CEA  . Tubal ligation    . Polypectomy  02/25/2012    Procedure: POLYPECTOMY;  Surgeon: Danie Binder, MD;  Location: [facility];  Service: Endoscopy;  Laterality: N/A;    Allergies  Allergen Reactions  . Carbamazepine Other (See Comments)   Reaction to tegretol - loopy  . Clonazepam Other (See Comments)    Unknown reaction  . Gemfibrozil Other (See Comments)    Patient is not aware of this allergy but states that she has side effects to a cholesterol medication in the past  . Macrobid [Nitrofurantoin] Nausea And Vomiting  . Oxycodone Other (See Comments)    hallucinations    Prior to Admission medications   Medication Sig Start Date End Date Taking? Authorizing Provider  amiodarone (PACERONE) 200 MG tablet Take 1 tablet (200 mg total) by mouth daily. 10/24/15  Yes Ivan Anchors Love, PA-C  amLODipine (NORVASC) 10 MG tablet Take 1 tablet (10 mg total) by mouth daily. 10/24/15  Yes Bary Leriche, PA-C  aspirin EC 81 MG EC tablet Take 1 tablet (81 mg total) by mouth daily. 10/18/15  Yes Reyne Dumas, MD  atorvastatin (LIPITOR) 80 MG tablet Take 1 tablet (80 mg total) by mouth daily. 10/24/15  Yes Ivan Anchors Love, PA-C  calcium acetate (PHOSLO) 667 MG capsule Take 2 capsules (1,334 mg total) by mouth 3 (three) times daily with meals. 10/24/15  Yes Ivan Anchors Love, PA-C  cetirizine (ZYRTEC) 10 MG tablet Take 10 mg by mouth at bedtime.   Yes Historical Provider, MD  Cholecalciferol (VITAMIN D) 2000 units  tablet Take 2,000 Units by mouth daily.   Yes Historical Provider, MD  furosemide (LASIX) 40 MG tablet Take 1 tablet (40 mg total) by mouth daily. Patient taking differently: Take 40 mg by mouth 2 (two) times daily.  10/24/15  Yes Ivan Anchors Love, PA-C  HYDROcodone-acetaminophen (NORCO) 7.5-325 MG tablet Take 1 tablet by mouth every 4 (four) hours as needed for moderate pain (Must last 30 days.  Do not drive or operate machinery while taking this medicine.). 08/17/15  Yes Sanjuana Kava, MD  insulin NPH-regular Human (NOVOLIN 70/30) (70-30) 100 UNIT/ML injection Inject 15 Units into the skin 2 (two) times daily with a meal. 10/24/15  Yes Ivan Anchors Love, PA-C  isosorbide-hydrALAZINE (BIDIL) 20-37.5 MG tablet Take 2 tablets by mouth 3 (three) times daily.  10/24/15  Yes Ivan Anchors Love, PA-C  metoprolol tartrate (LOPRESSOR) 25 MG tablet Take 1 tablet (25 mg total) by mouth 2 (two) times daily. 10/24/15  Yes Ivan Anchors Love, PA-C  nitroGLYCERIN (NITROSTAT) 0.4 MG SL tablet Place 1 tablet (0.4 mg total) under the tongue every 5 (five) minutes as needed for chest pain. Max three pills 10/24/15  Yes Ivan Anchors Love, PA-C  potassium chloride SA (K-DUR,KLOR-CON) 20 MEQ tablet Take 20 mEq by mouth daily.   Yes Historical Provider, MD  protein supplement (RESOURCE BENEPROTEIN) POWD Take 6 g by mouth 3 (three) times daily with meals. 10/24/15  Yes Ivan Anchors Love, PA-C  warfarin (COUMADIN) 5 MG tablet Take one pill on Mon, Wed, Fri, Sat, Sun. Take 1/2 pill on Tue and Thus Patient taking differently: 2.5-5 mg See admin instructions. Take one pill on Mon, Wed, Fri, Sat, Sun. Take 1/2 pill on Tue and Thus 10/25/15  Yes Bary Leriche, PA-C    Social History   Social History  . Marital Status: Married    Spouse Name: N/A  . Number of Children: N/A  . Years of Education: N/A   Occupational History  . Not on file.   Social History Main Topics  . Smoking status: Never Smoker   . Smokeless tobacco: Never Used  . Alcohol Use: No  . Drug Use: No  . Sexual Activity: Not on file   Other Topics Concern  . Not on file   Social History Narrative     Family History  Problem Relation Age of Onset  . Breast cancer Mother   . Heart disease Father   . Diabetes Father   . Heart disease Brother     ROS: [x]  Positive   [ ]  Negative   [ ]  All sytems reviewed and are negative  Cardiovascular: []  chest pain/pressure []  palpitations [x]  Hx MI and hx of CABG ~ 12 years ago [x]  SOB lying flat [x]  DOE [x]  hx rheumatic fever []  pain in legs while walking []  pain in legs at rest []  pain in legs at night []  non-healing ulcers []  hx of DVT [x]  swelling in legs  Pulmonary: []  productive cough []  asthma/wheezing []  home O2  Neurologic: []  weakness in []  arms []   legs []  numbness in []  arms []  legs []  hx of CVA []  mini stroke [] difficulty speaking or slurred speech []  temporary loss of vision in one eye []  dizziness  Hematologic: []  hx of cancer []  bleeding problems []  problems with blood clotting easily  Endocrine:   [x]  diabetes - neuropathy []  thyroid disease  GI []  vomiting blood []  blood in stool  GU: [x]  CKD/renal failure [x]  HD--[]  M/W/F or []  T/T/S []   burning with urination []  blood in urine  Psychiatric: [x]  anxiety [x]  depression  Musculoskeletal: []  arthritis []  joint pain  Integumentary: []  rashes []  ulcers  Constitutional: []  fever []  chills   Physical Examination  Filed Vitals:   11/29/15 1458 11/29/15 1501  BP: 150/68 145/44  Pulse: 53 52  Temp:    Resp: 12 13   Body mass index is 35.51 kg/(m^2).  General:  WDWN in NAD Gait: Not observed HENT: WNL, normocephalic Pulmonary: normal non-labored breathing Cardiac: regular Abdomen: obese Skin: without rashes Vascular Exam/Pulses:  Right Left  Radial 2+ (normal) 2+ (normal)   Extremities: without ischemic changes, without Gangrene , without cellulitis; without open wounds; pedal pulses are not palpable Musculoskeletal: no muscle wasting or atrophy  Neurologic: A&O X 3; Appropriate Affect ; SENSATION: normal; MOTOR FUNCTION:  moving all extremities equally. Speech is fluent/normal Psychiatric:  Normal affect   CBC    Component Value Date/Time   WBC 11.2* 11/29/2015 0312   RBC 3.42* 11/29/2015 0312   HGB 9.4* 11/29/2015 0312   HCT 30.5* 11/29/2015 0312   PLT 255 11/29/2015 0312   MCV 89.2 11/29/2015 0312   MCH 27.5 11/29/2015 0312   MCHC 30.8 11/29/2015 0312   RDW 17.0* 11/29/2015 0312   LYMPHSABS 1.7 11/10/2015 1647   MONOABS 0.6 11/10/2015 1647   EOSABS 0.3 11/10/2015 1647   BASOSABS 0.0 11/10/2015 1647    BMET    Component Value Date/Time   NA 134* 11/29/2015 0312   K 3.6 11/29/2015 0312   CL 98* 11/29/2015 0312   CO2 27  11/29/2015 0312   GLUCOSE 103* 11/29/2015 0312   BUN 26* 11/29/2015 0312   CREATININE 2.25* 11/29/2015 0312   CREATININE 2.37* 09/15/2015 1014   CALCIUM 8.1* 11/29/2015 0312   GFRNONAA 21* 11/29/2015 0312   GFRAA 24* 11/29/2015 0312    COAGS: Lab Results  Component Value Date   INR 1.49 11/29/2015   INR 1.70* 11/28/2015   INR 2.38* 11/27/2015     Non-Invasive Vascular Imaging:   Vein mapping [id number]  Right: Cephalic  Segment Diameter Depth Comment  1. Axilla 3.51mm 15.58mm   2. Mid upper arm 3.77mm 17.27mm   3. Above [facility] 4.71mm 6.61mm   4. In [facility] 4.63mm 6.34mm   5. Below AC 4.28mm 12.59mm Multiple branches  6. Mid forearm 3.21mm 8.94mm   7. Wrist 3.22mm [id number]        Basilic not evaluated.  Left: Cephalic  Segment Diameter Depth Comment  1. Axilla 1.3mm 5.76mm   2. Mid upper arm 1.33mm 8.68mm   3. Above [facility]   Unable to visualize  4. In [facility]   Unable to visualize  5. Below AC   Unable to visualize  6. Mid forearm   Unable to visualize  7. Wrist   Unable to visualize       Basilic  Segment Diameter Depth Comment  1. Axilla     2. Mid upper arm     3. Above AC 6.12mm 28.65mm   4. In [facility] 6.59mm 69mm   5. Below AC 5.57mm 2mm   6. Mid forearm 4.76mm 10.72mm   7. Wrist 3.59mm 2.72mm         Statin:  Yes.   Beta Blocker:  Yes.   Aspirin:  Yes.   ACEI:  No. ARB:  No. Other antiplatelets/anticoagulants:  Yes.   Heparin/Coumadin   ASSESSMENT/PLAN: This is a 70 y.o. female with acute on chronic CKD 5 now in need of  permanent HD access.   -pt did have a TDC placed in IR today -the pt is right handed and she does have an adequate left basilic vein.  Will plan of left basilic vein transposition on Friday by Dr. Donnetta Hutching. -pt is on heparin for afib.  This will need to be held on call to the OR and she may resume coumadin the day after the procedure given there is no  evident bleeding. -Dr. Donnetta Hutching has discussed this in detail with the pt and her family member -npo after MN Thursday   Samantha Rhyne, PA-C [facility] [phone]  I have examined the patient, reviewed and agree with above.Discussed options of AV fistula and AV graft placement. Vein mapping shows a very nice size in her left basilic vein. Will plan basilic vein transposition fistula on Friday, 12/01/2015  Curt Jews, MD 11/29/2015 4:12 PM

## 2015-11-29 NOTE — Progress Notes (Signed)
PROGRESS NOTE  Briana [name] [id number] DOB: Oct 16, 1945 DOA: 11/10/2015 PCP: Neysa Hotter B, PA-C   LOS: 19 days    Subjective: Denies any new complaints, asked about tunneled catheter versus fistula.  Brief Narrative: 70 y.o. female with past medical history significant for chronic diastolic CHF (in [id number] TEE with EF 55%), hypertension, dyslipidemia, atrial fibrillation on anticoagulation with Coumadin, recently hospitalized 5/6-5/24 of 2017 for pneumonia which was then complicated further by A.Fib RVR which was unstable and required cardioversion. She also had NSTEMI which was managed medically and no cardiac cath due to acute kidney failure. She was discharged to inpatient rehab.   Patient presented to [facility] with worsening shortness of breath over past 2 weeks prior to this admission, or extremity edema. She was seen by primary care physician the week prior to the admission and was instructed to take Lasix twice daily whereas before she was on 40 mg once a day Lasix. There was no significant symptomatic relief for which reason she presented to ED for further evaluation.   Hospital course complicated with worsening renal function and fluid overload for which nephrology was consulted. Patient was eventually moved to ICU and was on CRRT between 6/28 and 6/30 with significant improvement in her fluid status. Transferred to telemetry 6/30. Did not respond to diuresis and was becoming more fluid overload and was started on regular HD 7/3. Tunneled cath pending.   Assessment & Plan: Principal Problem:   Acute on chronic diastolic (congestive) heart failure (HCC) Active Problems:   Acute on chronic diastolic CHF (congestive heart failure) (HCC)   Controlled diabetes mellitus type 2 with complications (HCC)   CAD in native artery   Chronic kidney disease (CKD), stage IV (severe) (HCC)   Essential hypertension   Anemia of chronic kidney failure   Acute on chronic diastolic  CHF  - patient did not respond well initially to diuresis requiring CRRT for 72 h 6/28 - 6/30.  - Weight since 6/23: 109.7 kg --> 109.2 kg --> 108.8 kg --> 109.3 kg --> 108.1 kg >> 104.4 >> 97 kg >> 91 /// CRRT stopped > weight 94 kg >> 96 >> 96.5 until 7/3 when HD started per nephrology. - Did not respond to po Lasix with minimal UOP on 7/1 after CRRT, changed to IV Lasix on 7/2, with that she had suboptimal UOP and was started on HD 7/3 -Cardiology signed off, continue BiDil.  CKD stage 5, now ESRD. - Creatinine 3 weeks ago 2.75 - Renal following - BUN / Cr / weight continues to climb despite IV Lasix Lasix, had CRRT, did not respond to diuretics afterwards and had her first hemodialysis on 7/3 using temporary IJ catheter. She will undergo again hemodialysis on 7/4.  -Nephrology recommend to continue hemodialysis, she is to get tunneled catheter and permanent access. -Currently her Coumadin is on hold for the anticipated procedures.  Hyponatremia - 129>>132 >> 133, improved with HD  Anemia of chronic kidney disease stage 4 - Hemoglobin 7.6 >> 8.3 >> 8.8. Overall stable  Atrial fibrillation  - CHADS vasc score 5 - Recent TEE 09/2015 done for cardioversion - Continue amiodarone 200 mg daily and metoprolol 25 mg twice daily.  - Coumadin on hold pending tunneled cath, dosing per pharmacy. 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 - Continue 70/30 mix 15 units twice daily - Continue  sliding-scale insulin - CBGs controlled 126-139  Dyslipidemia associated with type 2 diabetes mellitus - Continue Lipitor 80 mg at bedtime - Continue daily aspirin   Essential hypertension - Continue Norvasc 10 mg daily, Bidil TID - hold metoprolol for now due to bradycardia   DVT prophylaxis: Coumadin Code Status: Full Family Communication: no family bedside Disposition  Plan: To SNF when dialysis access and CLIP procedure none  Consultants:   Nephrology  Cardiology - s/o 6/30  Procedures:   CRRT  HD  Antimicrobials:  None     Objective: Filed Vitals:   11/28/15 1216 11/28/15 2129 11/29/15 0502 11/29/15 1153  BP: 119/40 156/49 157/42 113/68  Pulse: 53 53 52 52  Temp: 98.5 F (36.9 C) 98.4 F (36.9 C) 98.2 F (36.8 C) 98.3 F (36.8 C)  TempSrc: Oral Oral Oral Oral  Resp: 18 18 18 18   Height:      Weight:   93.895 kg (207 lb)   SpO2: 96% 97% 96% 98%    Intake/Output Summary (Last 24 hours) at 11/29/15 1313 Last data filed at 11/29/15 0945  Gross per 24 hour  Intake 1116.17 ml  Output    150 ml  Net 966.17 ml   Filed Weights   11/28/15 0710 11/28/15 1110 11/29/15 0502  Weight: 95.2 kg (209 lb 14.1 oz) 93.2 kg (205 lb 7.5 oz) 93.895 kg (207 lb)    Examination: Constitutional: NAD Filed Vitals:   11/28/15 1216 11/28/15 2129 11/29/15 0502 11/29/15 1153  BP: 119/40 156/49 157/42 113/68  Pulse: 53 53 52 52  Temp: 98.5 F (36.9 C) 98.4 F (36.9 C) 98.2 F (36.8 C) 98.3 F (36.8 C)  TempSrc: Oral Oral Oral Oral  Resp: 18 18 18 18   Height:      Weight:   93.895 kg (207 lb)   SpO2: 96% 97% 96% 98%   Eyes: PERRL, lids and conjunctivae normal Respiratory: clear to auscultation bilaterally, no wheezing, no crackles. Normal respiratory effort. No accessory muscle use.  Cardiovascular: Regular rate and rhythm, no murmurs / rubs / gallops. Trace LE edema. Abdomen: no tenderness. Bowel sounds positive.  Musculoskeletal: no clubbing / cyanosis.  Neurologic: non focal   Data Reviewed: I have personally reviewed following labs and imaging studies  CBC:  Recent Labs Lab 11/24/15 0610 11/26/15 0435 11/27/15 1555 11/28/15 0428 11/29/15 0312  WBC 8.4 9.8 8.2 8.8 11.2*  HGB 9.7* 8.8* 8.3* 8.8* 9.4*  HCT 32.3* 28.3* 25.6* 28.6* 30.5*  MCV 91.2 87.6 85.3 86.9 89.2  PLT 279 257 232 269 [id number]   Basic Metabolic Panel:  Recent  Labs Lab 11/25/15 0655 11/26/15 0435 11/27/15 0510 11/28/15 0428 11/29/15 0312  NA 134* 129* 132* 133* 134*  K 5.2* 5.1 4.4 3.5 3.6  CL 100* 96* 89* 96* 98*  CO2 23 23 22 27 27   GLUCOSE 104* 140* 166* 126* 103*  BUN 68* 84* 103* 52* 26*  CREATININE 3.34* 3.75* 4.18* 2.81* 2.25*  CALCIUM 9.1 9.0 10.2 8.0* 8.1*  MG 2.6* 2.7* 2.7* 2.2 2.2  PHOS 3.3 4.3 6.0* 3.9 3.1   GFR: Estimated Creatinine Clearance: 25.9 mL/min (by C-G formula based on Cr of 2.25). Liver Function Tests:  Recent Labs Lab 11/25/15 0655 11/26/15 0435 11/27/15 0510 11/28/15 0428 11/29/15 0312  AST  --  16  --   --   --   ALT  --  14  --   --   --   ALKPHOS  --  54  --   --   --  BILITOT  --  0.4  --   --   --   PROT  --  6.4*  --   --   --   ALBUMIN 3.3* 3.2* 3.4* 3.4* 3.2*   Coagulation Profile:  Recent Labs Lab 11/25/15 0655 11/26/15 0435 11/27/15 0510 11/28/15 0428 11/29/15 0312  INR 2.54* 2.96* 2.38* 1.70* 1.49   CBG:  Recent Labs Lab 11/28/15 1225 11/28/15 1627 11/28/15 2128 11/29/15 0609 11/29/15 1148  GLUCAP 115* 221* 110* 98 132*   Anemia Panel: No results for input(s): VITAMINB12, FOLATE, FERRITIN, TIBC, IRON, RETICCTPCT in the last 72 hours. Urine analysis:    Component Value Date/Time   COLORURINE YELLOW 11/10/2015 1521   APPEARANCEUR CLEAR 11/10/2015 1521   LABSPEC 1.013 11/10/2015 1521   PHURINE 5.5 11/10/2015 1521   GLUCOSEU NEGATIVE 11/10/2015 1521   HGBUR NEGATIVE 11/10/2015 1521   BILIRUBINUR NEGATIVE 11/10/2015 1521   KETONESUR NEGATIVE 11/10/2015 1521   PROTEINUR 100* 11/10/2015 1521   UROBILINOGEN 0.2 03/02/2014 1508   NITRITE NEGATIVE 11/10/2015 1521   LEUKOCYTESUR TRACE* 11/10/2015 1521   Sepsis Labs: Invalid input(s): PROCALCITONIN, LACTICIDVEN  Recent Results (from the past 240 hour(s))  MRSA PCR Screening     Status: None   Collection Time: 11/21/15  2:16 PM  Result Value Ref Range Status   MRSA by PCR NEGATIVE NEGATIVE Final    Comment:          The GeneXpert MRSA Assay (FDA approved for NASAL specimens only), is one component of a comprehensive MRSA colonization surveillance program. It is not intended to diagnose MRSA infection nor to guide or monitor treatment for MRSA infections.     Radiology Studies: No results found. Scheduled Meds: . amiodarone  200 mg Oral Daily  . amLODipine  5 mg Oral QHS  . atorvastatin  80 mg Oral Daily  . bisacodyl  10 mg Rectal Once  . darbepoetin (ARANESP) injection - DIALYSIS  60 mcg Intravenous Q Mon-HD  . docusate sodium  100 mg Oral BID  . ferric gluconate (FERRLECIT/NULECIT) IV  125 mg Intravenous Daily  . insulin aspart  0-15 Units Subcutaneous TID WC  . insulin aspart  0-5 Units Subcutaneous QHS  . insulin aspart protamine- aspart  15 Units Subcutaneous BID WC  . loratadine  10 mg Oral Daily  . multivitamin  1 tablet Oral QHS  . polyethylene glycol  17 g Oral BID  . protein supplement  1 scoop Oral TID WC  . sevelamer carbonate  800 mg Oral TID WC  . sodium chloride flush  10-40 mL Intracatheter Q12H  . sodium chloride flush  3 mL Intravenous Q12H   Continuous Infusions: . heparin 1,000 Units/hr (11/29/15 0945)    Marzetta Board, MD, PhD [facility] Pager [phone]  If 7PM-7AM, please contact night-coverage [email or web address] Password [email or web address] 11/29/2015, 1:13 PM

## 2015-11-29 NOTE — Progress Notes (Signed)
ANTICOAGULATION CONSULT NOTE - Follow Up Consult  Pharmacy Consult for heparin Indication: atrial fibrillation  Allergies  Allergen Reactions  . Carbamazepine Other (See Comments)    Reaction to tegretol - loopy  . Clonazepam Other (See Comments)    Unknown reaction  . Gemfibrozil Other (See Comments)    Patient is not aware of this allergy but states that she has side effects to a cholesterol medication in the past  . Macrobid [Nitrofurantoin] Nausea And Vomiting  . Oxycodone Other (See Comments)    hallucinations    Patient Measurements: Height: 5\' 4"  (162.6 cm) Weight: 207 lb (93.895 kg) (scale c) IBW/kg (Calculated) : 54.7 Heparin Dosing Weight: 76.4 kg  Vital Signs: Temp: 98.2 F (36.8 C) (07/05 0502) Temp Source: Oral (07/05 0502) BP: 157/42 mmHg (07/05 0502) Pulse Rate: 52 (07/05 0502)  Labs:  Recent Labs  11/27/15 0510  11/27/15 1555 11/28/15 0428 11/28/15 1824 11/29/15 0312  HGB  --   < > 8.3* 8.8*  --  9.4*  HCT  --   --  25.6* 28.6*  --  30.5*  PLT  --   --  232 269  --  255  LABPROT 25.7*  --   --  20.0*  --  18.1*  INR 2.38*  --   --  1.70*  --  1.49  HEPARINUNFRC  --   --   --   --  0.39 0.36  CREATININE 4.18*  --   --  2.81*  --  2.25*  < > = values in this interval not displayed.  Estimated Creatinine Clearance: 25.9 mL/min (by C-G formula based on Cr of 2.25).   Medications:  Prescriptions prior to admission  Medication Sig Dispense Refill Last Dose  . amiodarone (PACERONE) 200 MG tablet Take 1 tablet (200 mg total) by mouth daily. 30 tablet 1 11/10/2015 at Unknown time  . amLODipine (NORVASC) 10 MG tablet Take 1 tablet (10 mg total) by mouth daily. 30 tablet 0 11/10/2015 at Unknown time  . aspirin EC 81 MG EC tablet Take 1 tablet (81 mg total) by mouth daily. 30 tablet 1 11/10/2015 at Unknown time  . atorvastatin (LIPITOR) 80 MG tablet Take 1 tablet (80 mg total) by mouth daily. 30 tablet 0 11/10/2015 at Unknown time  . calcium acetate (PHOSLO)  667 MG capsule Take 2 capsules (1,334 mg total) by mouth 3 (three) times daily with meals. 180 capsule 1 11/10/2015 at Unknown time  . cetirizine (ZYRTEC) 10 MG tablet Take 10 mg by mouth at bedtime.   11/09/2015 at Unknown time  . Cholecalciferol (VITAMIN D) 2000 units tablet Take 2,000 Units by mouth daily.   11/10/2015 at Unknown time  . furosemide (LASIX) 40 MG tablet Take 1 tablet (40 mg total) by mouth daily. (Patient taking differently: Take 40 mg by mouth 2 (two) times daily. ) 30 tablet 0 11/10/2015 at Unknown time  . HYDROcodone-acetaminophen (NORCO) 7.5-325 MG tablet Take 1 tablet by mouth every 4 (four) hours as needed for moderate pain (Must last 30 days.  Do not drive or operate machinery while taking this medicine.). 120 tablet 0 Past Week at Unknown time  . insulin NPH-regular Human (NOVOLIN 70/30) (70-30) 100 UNIT/ML injection Inject 15 Units into the skin 2 (two) times daily with a meal. 50 mL 0 11/10/2015 at Unknown time  . isosorbide-hydrALAZINE (BIDIL) 20-37.5 MG tablet Take 2 tablets by mouth 3 (three) times daily. 180 tablet 0 11/10/2015 at Unknown time  . metoprolol tartrate (  LOPRESSOR) 25 MG tablet Take 1 tablet (25 mg total) by mouth 2 (two) times daily. 30 tablet 1 11/10/2015 at 0800  . nitroGLYCERIN (NITROSTAT) 0.4 MG SL tablet Place 1 tablet (0.4 mg total) under the tongue every 5 (five) minutes as needed for chest pain. Max three pills 45 tablet 0 unk at unk  . potassium chloride SA (K-DUR,KLOR-CON) 20 MEQ tablet Take 20 mEq by mouth daily.   11/09/2015 at Unknown time  . protein supplement (RESOURCE BENEPROTEIN) POWD Take 6 g by mouth 3 (three) times daily with meals.  0 Past Week at Unknown time  . warfarin (COUMADIN) 5 MG tablet Take one pill on Mon, Wed, Fri, Sat, Sun. Take 1/2 pill on Tue and Thus (Patient taking differently: 2.5-5 mg See admin instructions. Take one pill on Mon, Wed, Fri, Sat, Sun. Take 1/2 pill on Tue and Thus) 30 tablet 0 11/09/2015 at 1800     Assessment: 70 yo F admitted 11/10/2015 on warfarin PTA for Afib.  Currently holding warfarin and transitioned to heparin for IR to place tunneled cath.  INR 1.49, Hgb 9.4, Plt 255, No s/sx of bleeding noted.  HL therapeutic this am at 0.36 on heparin infusion of 1000 units/hr.   Goal of Therapy:  Heparin level 0.3-0.7 units/ml Monitor platelets by anticoagulation protocol: Yes   Plan:  - Continue heparin at 1000 units /hr - Daily HL and CBC - F/u resumption of warfarin post procedure   Vincenza Hews, PharmD, BCPS 11/29/2015, 7:25 AM Pager: [phone]

## 2015-11-29 NOTE — Progress Notes (Signed)
Physical Therapy Treatment Patient Details Name: Briana [name] MRN: [id number] DOB: 13-Feb-1946 Today's Date: 11/29/2015    History of Present Illness Pt is a 70 y/o F who was just admitted last month for multi week (5/6-5/24) hospital stay that started with treatment in the ICU for PNA, followed by diuresis for CHF exacerbation. This was then complicated further by A.Fib RVR which was unstable and required cardioversion. Around that time she also had AKF develop that thankfully would resolve. Also had NSTEMI with trop elevation during that stay that was managed medically, no cath due to kidney function.Pt presents this admission w/ c/o SOB, peripheral edema, and DOE.  Admitting Dx: acute on chronic diastolic CHF.  Pt's PMH includes PVD, depression, anxiety, neuropathy, a-fib.    PT Comments    Pt with improved activity tolerance today, able to ambulate 30 feet with min guard assist. Overall the patient does fatigue quickly but able to participate in PT session today. Anticipating conversion to tunneled catheter later today. PT to continue to follow and progress as tolerated, anticipate D/C to SNF when medically ready.   Follow Up Recommendations  SNF;Supervision for mobility/OOB     Equipment Recommendations  Rolling walker with 5" wheels    Recommendations for Other Services       Precautions / Restrictions Precautions Precautions: Fall Restrictions Weight Bearing Restrictions: No    Mobility  Bed Mobility Overal bed mobility: Needs Assistance Bed Mobility: Supine to Sit     Supine to sit: Supervision;HOB elevated        Transfers Overall transfer level: Needs assistance Equipment used: None Transfers: Sit to/from Stand Sit to Stand: Supervision;Min guard Stand pivot transfers: Supervision       General transfer comment: supervision for safety  Ambulation/Gait Ambulation/Gait assistance: Min guard Ambulation Distance (Feet): 30 Feet Assistive device:  None Gait Pattern/deviations: Step-through pattern;Decreased step length - right;Decreased step length - left;Wide base of support Gait velocity: decreased   General Gait Details: mild instability but no loss of balance   Stairs            Wheelchair Mobility    Modified Rankin (Stroke Patients Only)       Balance Overall balance assessment: Needs assistance Sitting-balance support: No upper extremity supported Sitting balance-Leahy Scale: Good     Standing balance support: No upper extremity supported Standing balance-Leahy Scale: Fair                      Cognition Arousal/Alertness: Awake/alert Behavior During Therapy: WFL for tasks assessed/performed Overall Cognitive Status: Within Functional Limits for tasks assessed                      Exercises      General Comments        Pertinent Vitals/Pain Pain Assessment: No/denies pain    Home Living                      Prior Function            PT Goals (current goals can now be found in the care plan section) Acute Rehab PT Goals Patient Stated Goal: get better PT Goal Formulation: With patient Time For Goal Achievement: 12/13/15 Potential to Achieve Goals: Good Progress towards PT goals: Progressing toward goals    Frequency  Min 2X/week    PT Plan Current plan remains appropriate    Co-evaluation  End of Session Equipment Utilized During Treatment: Gait belt Activity Tolerance: Patient tolerated treatment well Patient left: in chair;with call bell/phone within reach     Time: 0928-0947 PT Time Calculation (min) (ACUTE ONLY): 19 min  Charges:  $Gait Training: 8-22 mins                    G Codes:      Cassell Clement, PT, CSCS Pager [phone] Office [phone]  11/29/2015, 9:54 AM

## 2015-11-29 NOTE — Progress Notes (Addendum)
Heparin drip d/c at 0945 per IR PA for dialysis cath placement this noon. Will continue to monitor patient.

## 2015-11-29 NOTE — Progress Notes (Signed)
ANTICOAGULATION CONSULT NOTE - Follow Up Consult  Pharmacy Consult for heparin Indication: atrial fibrillation  Allergies  Allergen Reactions  . Carbamazepine Other (See Comments)    Reaction to tegretol - loopy  . Clonazepam Other (See Comments)    Unknown reaction  . Gemfibrozil Other (See Comments)    Patient is not aware of this allergy but states that she has side effects to a cholesterol medication in the past  . Macrobid [Nitrofurantoin] Nausea And Vomiting  . Oxycodone Other (See Comments)    hallucinations    Patient Measurements: Height: 5\' 4"  (162.6 cm) Weight: 207 lb (93.895 kg) (scale c) IBW/kg (Calculated) : 54.7 Heparin Dosing Weight: 76.4 kg  Vital Signs: Temp: 98.3 F (36.8 C) (07/05 1153) Temp Source: Oral (07/05 1153) BP: 145/44 mmHg (07/05 1501) Pulse Rate: 52 (07/05 1501)  Labs:  Recent Labs  11/27/15 0510  11/27/15 1555 11/28/15 0428 11/28/15 1824 11/29/15 0312  HGB  --   < > 8.3* 8.8*  --  9.4*  HCT  --   --  25.6* 28.6*  --  30.5*  PLT  --   --  232 269  --  255  LABPROT 25.7*  --   --  20.0*  --  18.1*  INR 2.38*  --   --  1.70*  --  1.49  HEPARINUNFRC  --   --   --   --  0.39 0.36  CREATININE 4.18*  --   --  2.81*  --  2.25*  < > = values in this interval not displayed.  Estimated Creatinine Clearance: 25.9 mL/min (by C-G formula based on Cr of 2.25).     Assessment: 70 yo F admitted 11/10/2015 on warfarin PTA for Afib.  Currently holding warfarin and transitioned to heparin for IR to place tunneled cath.  INR 1.49, Hgb 9.4, Plt 255, No s/sx of bleeding noted.  S/p TDC, low risk bleed procedure, heparin to resume 4 hours post procedure  Goal of Therapy:  Heparin level 0.3-0.7 units/ml Monitor platelets by anticoagulation protocol: Yes   Plan:  - Heparin at 1050 units / hr starting at 8 pm - Daily HL and CBC - F/u resumption of warfarin post procedure   Thank you Anette Guarneri, PharmD [phone] 11/29/2015, 3:50  PM

## 2015-11-30 LAB — GLUCOSE, CAPILLARY
Glucose-Capillary: 176 mg/dL — ABNORMAL HIGH (ref 65–99)
Glucose-Capillary: 157 mg/dL — ABNORMAL HIGH (ref 65–99)
Glucose-Capillary: 191 mg/dL — ABNORMAL HIGH (ref 65–99)
Glucose-Capillary: 135 mg/dL — ABNORMAL HIGH (ref 65–99)

## 2015-11-30 LAB — RENAL FUNCTION PANEL
ANION GAP: 7 (ref 5–15)
Albumin: 3.2 g/dL — ABNORMAL LOW (ref 3.5–5.0)
BUN: 34 mg/dL — ABNORMAL HIGH (ref 6–20)
CHLORIDE: 100 mmol/L — AB (ref 101–111)
CO2: 26 mmol/L (ref 22–32)
Calcium: 8.2 mg/dL — ABNORMAL LOW (ref 8.9–10.3)
Creatinine, Ser: 2.74 mg/dL — ABNORMAL HIGH (ref 0.44–1.00)
GFR, EST AFRICAN AMERICAN: 19 mL/min — AB (ref >60)
GFR, EST NON AFRICAN AMERICAN: 16 mL/min — AB (ref >60)
Glucose, Bld: 166 mg/dL — ABNORMAL HIGH (ref 65–99)
POTASSIUM: 3.5 mmol/L (ref 3.5–5.1)
Phosphorus: 3.9 mg/dL (ref 2.5–4.6)
Sodium: 133 mmol/L — ABNORMAL LOW (ref 135–145)

## 2015-11-30 LAB — CBC
HEMATOCRIT: 29.1 % — AB (ref 36.0–46.0)
Hemoglobin: 9.1 g/dL — ABNORMAL LOW (ref 12.0–15.0)
MCH: 28.3 pg (ref 26.0–34.0)
MCHC: 31.3 g/dL (ref 30.0–36.0)
MCV: 90.4 fL (ref 78.0–100.0)
Platelets: 240 10*3/uL (ref 150–400)
RBC: 3.22 MIL/uL — ABNORMAL LOW (ref 3.87–5.11)
RDW: 17.3 % — AB (ref 11.5–15.5)
WBC: 8.1 10*3/uL (ref 4.0–10.5)

## 2015-11-30 LAB — PROTIME-INR
INR: 1.4 (ref 0.00–1.49)
Prothrombin Time: 17.3 seconds — ABNORMAL HIGH (ref 11.6–15.2)

## 2015-11-30 LAB — HEPARIN LEVEL (UNFRACTIONATED)
HEPARIN UNFRACTIONATED: 0.22 [IU]/mL — AB (ref 0.30–0.70)
Heparin Unfractionated: 0.33 IU/mL (ref 0.30–0.70)

## 2015-11-30 LAB — MAGNESIUM: Magnesium: 2.4 mg/dL (ref 1.7–2.4)

## 2015-11-30 MED ORDER — ALTEPLASE 2 MG IJ SOLR: 2.0000 mg | Freq: Once | INTRAMUSCULAR | Status: DC | PRN

## 2015-11-30 MED ORDER — PENTAFLUOROPROP-TETRAFLUOROETH EX AERO: 1.0000 "application " | INHALATION_SPRAY | CUTANEOUS | Status: DC | PRN

## 2015-11-30 MED ORDER — CEFAZOLIN IN D5W 1 GM/50ML IV SOLN: 1.0000 g | INTRAVENOUS | Status: DC

## 2015-11-30 MED ORDER — SODIUM CHLORIDE 0.9 % IV SOLN: 100.0000 mL | INTRAVENOUS | Status: DC | PRN

## 2015-11-30 MED ORDER — CEFAZOLIN SODIUM-DEXTROSE 2-4 GM/100ML-% IV SOLN
2.0000 g | INTRAVENOUS | Status: DC
  Filled 2015-11-30: qty 100

## 2015-11-30 MED ORDER — LIDOCAINE-PRILOCAINE 2.5-2.5 % EX CREA: 1.0000 "application " | TOPICAL_CREAM | CUTANEOUS | Status: DC | PRN

## 2015-11-30 MED ORDER — LIDOCAINE HCL (PF) 1 % IJ SOLN: 5.0000 mL | INTRAMUSCULAR | Status: DC | PRN

## 2015-11-30 MED ORDER — HEPARIN SODIUM (PORCINE) 1000 UNIT/ML DIALYSIS
1000.0000 [IU] | INTRAMUSCULAR | Status: DC | PRN
  Filled 2015-11-30: qty 1

## 2015-11-30 NOTE — Progress Notes (Signed)
Hemodialysis- Pt tolerated well without issue. 2.5L goal met. Currently has no complaints. Report called to primary RN

## 2015-11-30 NOTE — Progress Notes (Signed)
ANTICOAGULATION CONSULT NOTE - Follow Up Consult  Pharmacy Consult for heparin Indication: atrial fibrillation  Allergies  Allergen Reactions  . Oxycodone Other (See Comments)    hallucinations  . Clonazepam Other (See Comments)    Unknown reaction  . Gemfibrozil Other (See Comments)    Patient is not aware of this allergy but states that she has side effects to a cholesterol medication in the past  . Carbamazepine Other (See Comments)    Reaction to tegretol - loopy  . Macrobid [Nitrofurantoin] Nausea And Vomiting    Patient Measurements: Height: 5\' 4"  (162.6 cm) Weight: 202 lb 6.1 oz (91.8 kg) IBW/kg (Calculated) : 54.7 Heparin Dosing Weight: 76.4 kg  Vital Signs: Temp: 98.7 F (37.1 C) (07/06 1209) Temp Source: Oral (07/06 1209) BP: 107/67 mmHg (07/06 1209) Pulse Rate: 56 (07/06 1209)  Labs:  Recent Labs  11/28/15 0428  11/29/15 0312 11/30/15 0655 11/30/15 1748  HGB 8.8*  --  9.4* 9.1*  --   HCT 28.6*  --  30.5* 29.1*  --   PLT 269  --  255 240  --   LABPROT 20.0*  --  18.1* 17.3*  --   INR 1.70*  --  1.49 1.40  --   HEPARINUNFRC  --   < > 0.36 0.22* 0.33  CREATININE 2.81*  --  2.25* 2.74*  --   < > = values in this interval not displayed.  Estimated Creatinine Clearance: 21 mL/min (by C-G formula based on Cr of 2.74).  Assessment: 70 yo F admitted 11/10/2015 on warfarin PTA for Afib.  Currently holding warfarin and transitioned to heparin for IR to place tunneled cath- now completed. However, unable to place permanent access until Monday per vascular note.  Heparin level this evening was drawn late, but is in range at 0.33 units/mL. No bleeding noted.  Goal of Therapy:  Heparin level 0.3-0.7 units/ml Monitor platelets by anticoagulation protocol: Yes   Plan:  - Continue heparin at 1100 units/hr - Daily HL and CBC- confirmatory level with AM labs - F/u resumption of warfarin after permanent access placed  Mry Lamia D. Terreon Ekholm, PharmD, BCPS Clinical  Pharmacist Pager: [phone] 11/30/2015 6:53 PM

## 2015-11-30 NOTE — Progress Notes (Signed)
PROGRESS NOTE  Briana [name] FXT:[id number] DOB: [birth year] DOA: 11/10/2015 PCP: Neysa Hotter B, PA-C   LOS: 20 days    Subjective: No new complaints, denies any problems, had her tunneled catheter placed yesterday he Will restart Coumadin tomorrow after the surgery if it's okay with vascular surgery.  Brief Narrative: 70 y.o. female with past medical history significant for chronic diastolic CHF (in 29/9242 TEE with EF 55%), hypertension, dyslipidemia, atrial fibrillation on anticoagulation with Coumadin, recently hospitalized 5/6-5/24 of 2017 for pneumonia which was then complicated further by A.Fib RVR which was unstable and required cardioversion. She also had NSTEMI which was managed medically and no cardiac cath due to acute kidney failure. She was discharged to inpatient rehab.   Patient presented to [facility] with worsening shortness of breath over past 2 weeks prior to this admission, or extremity edema. She was seen by primary care physician the week prior to the admission and was instructed to take Lasix twice daily whereas before she was on 40 mg once a day Lasix. There was no significant symptomatic relief for which reason she presented to ED for further evaluation.   Hospital course complicated with worsening renal function and fluid overload for which nephrology was consulted. Patient was eventually moved to ICU and was on CRRT between 6/28 and 6/30 with significant improvement in her fluid status. Transferred to telemetry 6/30. Did not respond to diuresis and was becoming more fluid overload and was started on regular HD 7/3. Tunneled cath pending.   Assessment & Plan: Principal Problem:   Acute on chronic diastolic (congestive) heart failure (HCC) Active Problems:   Acute on chronic diastolic CHF (congestive heart failure) (HCC)   Controlled diabetes mellitus type 2 with complications (HCC)   CAD in native artery   Chronic kidney disease (CKD), stage IV (severe)  (HCC)   Essential hypertension   Anemia of chronic kidney failure   Acute on chronic diastolic CHF  - patient did not respond well initially to diuresis requiring CRRT for 72 h 6/28 - 6/30.  - Weight since 6/23: 109.7 kg --> 109.2 kg --> 108.8 kg --> 109.3 kg --> 108.1 kg >> 104.4 >> 97 kg >> 91 /// CRRT stopped > weight 94 kg >> 96 >> 96.5 until 7/3 when HD started per nephrology. - Did not respond to po Lasix with minimal UOP on 7/1 after CRRT, changed to IV Lasix on 7/2, with that she had suboptimal UOP and was started on HD 7/3 -Cardiology signed off, continue BiDil.  CKD stage 5, now ESRD. - Creatinine 3 weeks ago 2.75 - Renal following - BUN / Cr / weight continues to climb despite IV Lasix Lasix, had CRRT, did not respond to diuretics afterwards and had her first hemodialysis on 7/3 using temporary IJ catheter. She will undergo again hemodialysis on 7/4.  -Nephrology recommend to continue hemodialysis, HD tunneled catheter placed on 11/29/15 by Dr. Earleen Newport. -Vascular surgery consulted, systolic creation scheduled for 12/01/2015.  Hyponatremia - 129>>132 >> 133, improved with HD  Anemia of chronic kidney disease stage 4 - Hemoglobin 7.6 >> 8.3 >> 8.8. Overall stable  Atrial fibrillation  - CHADS vasc score 5 - Recent TEE 09/2015 done for cardioversion - Continue amiodarone 200 mg daily and metoprolol 25 mg twice daily.  - Coumadin on hold pending tunneled cath, dosing per pharmacy. 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  Consultants:   Nephrology  Cardiology - s/o 6/30  Procedures:   CRRT  HD  Antimicrobials:  None     Objective: Filed Vitals:   11/30/15 0930 11/30/15 0957 11/30/15 1030 11/30/15 1058  BP: 129/69 128/56 123/66 133/62  Pulse: 54 54 55 58  Temp:    98.2 F (36.8 C)  TempSrc:    Oral  Resp: '12 13  12  ' Height:      Weight:    91.8 kg (202 lb 6.1 oz)  SpO2:    96%    Intake/Output Summary (Last 24 hours) at 11/30/15 1144 Last data filed at 11/30/15 1058  Gross per 24 hour  Intake    570 ml  Output   2800 ml  Net  -2230 ml   Filed Weights   11/30/15 0454 11/30/15 0650 11/30/15 1058  Weight: 94.756 kg (208 lb 14.4 oz) 94.4 kg (208 lb 1.8 oz) 91.8 kg (202 lb 6.1 oz)    Examination: Constitutional: NAD Filed Vitals:   11/30/15 0930 11/30/15 0957 11/30/15 1030 11/30/15 1058  BP: 129/69 128/56 123/66 133/62  Pulse: 54 54 55 58  Temp:    98.2 F (36.8 C)  TempSrc:    Oral  Resp: '12 13  12  ' Height:      Weight:    91.8 kg (202 lb 6.1 oz)  SpO2:    96%   Eyes: PERRL, lids and conjunctivae normal Respiratory: clear to auscultation bilaterally, no wheezing, no crackles. Normal respiratory effort. No accessory muscle use.  Cardiovascular: Regular rate and rhythm, no murmurs / rubs / gallops. Trace LE edema. Abdomen: no tenderness. Bowel sounds positive.  Musculoskeletal: no clubbing / cyanosis.  Neurologic: non focal   Data Reviewed: I have personally reviewed following labs and imaging studies  CBC:  Recent Labs Lab 11/26/15 0435 11/27/15 1555 11/28/15 0428 11/29/15 0312 11/30/15 0655  WBC 9.8 8.2 8.8 11.2* 8.1  HGB 8.8* 8.3* 8.8* 9.4* 9.1*  HCT 28.3* 25.6* 28.6* 30.5* 29.1*  MCV 87.6 85.3 86.9 89.2 90.4  PLT 257 232 269 255 449   Basic Metabolic Panel:  Recent Labs Lab  11/26/15 0435 11/27/15 0510 11/28/15 0428 11/29/15 0312 11/30/15 0655  NA 129* 132* 133* 134* 133*  K 5.1 4.4 3.5 3.6 3.5  CL 96* 89* 96* 98* 100*  CO2 '23 22 27 27 26  ' GLUCOSE 140* 166* 126* 103* 166*  BUN 84* 103* 52* 26* 34*  CREATININE 3.75* 4.18* 2.81* 2.25* 2.74*  CALCIUM 9.0 10.2 8.0* 8.1* 8.2*  MG 2.7* 2.7* 2.2 2.2 2.4  PHOS 4.3 6.0* 3.9 3.1 3.9   GFR: Estimated Creatinine Clearance: 21 mL/min (by C-G formula based on Cr of 2.74). Liver Function Tests:  Recent Labs Lab 11/26/15 0435 11/27/15 0510 11/28/15 0428 11/29/15 0312 11/30/15 0655  AST 16  --   --   --   --   ALT 14  --   --   --   --   Arabella Merles  54  --   --   --   --   BILITOT 0.4  --   --   --   --   PROT 6.4*  --   --   --   --   ALBUMIN 3.2* 3.4* 3.4* 3.2* 3.2*   Coagulation Profile:  Recent Labs Lab 11/26/15 0435 11/27/15 0510 11/28/15 0428 11/29/15 0312 11/30/15 0655  INR 2.96* 2.38* 1.70* 1.49 1.40   CBG:  Recent Labs Lab 11/29/15 0609 11/29/15 1148 11/29/15 1657 11/29/15 2120 11/30/15 0609  GLUCAP 98 132* 113* 236* 176*   Anemia Panel: No results for input(s): VITAMINB12, FOLATE, FERRITIN, TIBC, IRON, RETICCTPCT in the last 72 hours. Urine analysis:    Component Value Date/Time   COLORURINE YELLOW 11/10/2015 1521   APPEARANCEUR CLEAR 11/10/2015 1521   LABSPEC 1.013 11/10/2015 1521   PHURINE 5.5 11/10/2015 1521   GLUCOSEU NEGATIVE 11/10/2015 1521   HGBUR NEGATIVE 11/10/2015 1521   BILIRUBINUR NEGATIVE 11/10/2015 1521   KETONESUR NEGATIVE 11/10/2015 1521   PROTEINUR 100* 11/10/2015 1521   UROBILINOGEN 0.2 03/02/2014 1508   NITRITE NEGATIVE 11/10/2015 1521   LEUKOCYTESUR TRACE* 11/10/2015 1521   Sepsis Labs: Invalid input(s): PROCALCITONIN, LACTICIDVEN  Recent Results (from the past 240 hour(s))  MRSA PCR Screening     Status: None   Collection Time: 11/21/15  2:16 PM  Result Value Ref Range Status   MRSA by PCR NEGATIVE NEGATIVE Final    Comment:        The  GeneXpert MRSA Assay (FDA approved for NASAL specimens only), is one component of a comprehensive MRSA colonization surveillance program. It is not intended to diagnose MRSA infection nor to guide or monitor treatment for MRSA infections.     Radiology Studies: Ir Fluoro Guide Cv Line Right  11/29/2015  INDICATION: 66-year-old female with a history of renal failure EXAM: TUNNELED PICC LINE WITH ULTRASOUND AND FLUOROSCOPIC GUIDANCE MEDICATIONS: 2.0 g Ancef. The antibiotic was given in an appropriate time interval prior to skin puncture. ANESTHESIA/SEDATION: Versed 1.0 mg IV; Fentanyl 25 mcg IV; Moderate Sedation Time:  15 The patient was continuously monitored during the procedure by the interventional radiology nurse under my direct supervision. FLUOROSCOPY TIME:  Fluoroscopy Time: 0 minutes 24 seconds (2.6 mGy). COMPLICATIONS: None PROCEDURE: Informed written consent was obtained from the patient after a discussion of the risks, benefits, and alternatives to treatment. Questions regarding the procedure were encouraged and answered. The right neck and chest were prepped with chlorhexidine in a sterile fashion, and a sterile drape was applied covering the operative field. Maximum barrier sterile technique with sterile gowns and gloves were used for the procedure. A timeout was performed prior to the initiation of the procedure. After creating a small venotomy incision, a micropuncture kit was utilized to access the right internal jugular vein under direct, real-time ultrasound guidance after the overlying soft tissues were anesthetized with 1% lidocaine with epinephrine. Ultrasound image documentation was performed. The microwire was marked to measure appropriate internal catheter length. External tunneled length was estimated. A total tip to cuff length of 23 cm was selected. Skin and subcutaneous tissues of chest wall below the clavicle were generously infiltrated with 1% lidocaine for local anesthesia.  A small stab incision was made with 11 blade scalpel. The selected hemodialysis catheter was tunneled in a retrograde fashion from the anterior chest wall to the venotomy incision. A guidewire was advanced to the level of the IVC and the micropuncture sheath was exchanged for a peel-away sheath.  The catheter was then placed through the peel-away sheath with tips ultimately positioned within the superior aspect of the right atrium. Final catheter positioning was confirmed and documented with a spot radiographic image. The catheter aspirates and flushes normally. The catheter was flushed with appropriate volume heparin dwells. The catheter exit site was secured with a 0-Prolene retention suture. The venotomy incision was closed Derma bond and sterile dressing. Dressings were applied at the chest wall. The indwelling temporary HD catheter was removed. Final image was stored. Patient tolerated the procedure well and remained hemodynamically stable throughout. No complications were encountered and no significant blood loss encountered. FINDINGS: After catheter placement, the tip lies within the cavoatrial junction The catheter aspirates and flushes normally and is ready for immediate use. IMPRESSION: Status post placement of right IJ tunneled hemodialysis catheter. Catheter ready for use. Signed, Dulcy Fanny. Earleen Newport, DO [facility] Radiology Electronically Signed   By: Corrie Mckusick D.O.   On: 11/29/2015 15:44   Ir US Guide Vasc Access Right  11/29/2015  INDICATION: 36-year-old female with a history of renal failure EXAM: TUNNELED PICC LINE WITH ULTRASOUND AND FLUOROSCOPIC GUIDANCE MEDICATIONS: 2.0 g Ancef. The antibiotic was given in an appropriate time interval prior to skin puncture. ANESTHESIA/SEDATION: Versed 1.0 mg IV; Fentanyl 25 mcg IV; Moderate Sedation Time:  15 The patient was continuously monitored during the procedure by the interventional radiology nurse under  my direct supervision. FLUOROSCOPY TIME:  Fluoroscopy Time: 0 minutes 24 seconds (2.6 mGy). COMPLICATIONS: None PROCEDURE: Informed written consent was obtained from the patient after a discussion of the risks, benefits, and alternatives to treatment. Questions regarding the procedure were encouraged and answered. The right neck and chest were prepped with chlorhexidine in a sterile fashion, and a sterile drape was applied covering the operative field. Maximum barrier sterile technique with sterile gowns and gloves were used for the procedure. A timeout was performed prior to the initiation of the procedure. After creating a small venotomy incision, a micropuncture kit was utilized to access the right internal jugular vein under direct, real-time ultrasound guidance after the overlying soft tissues were anesthetized with 1% lidocaine with epinephrine. Ultrasound image documentation was performed. The microwire was marked to measure appropriate internal catheter length. External tunneled length was estimated. A total tip to cuff length of 23 cm was selected. Skin and subcutaneous tissues of chest wall below the clavicle were generously infiltrated with 1% lidocaine for local anesthesia. A small stab incision was made with 11 blade scalpel. The selected hemodialysis catheter was tunneled in a retrograde fashion from the anterior chest wall to the venotomy incision. A guidewire was advanced to the level of the IVC and the micropuncture sheath was exchanged for a peel-away sheath. The catheter was then placed through the peel-away sheath with tips ultimately positioned within the superior aspect of the right atrium. Final catheter positioning was confirmed and documented with a spot radiographic image. The catheter aspirates and flushes normally. The catheter was flushed with appropriate volume heparin dwells. The catheter exit site was secured with a 0-Prolene retention suture. The venotomy incision was closed Derma  bond and sterile dressing. Dressings were applied at the chest wall. The indwelling temporary HD catheter was removed. Final image was stored. Patient tolerated the procedure well and remained hemodynamically stable throughout. No complications were encountered and no significant blood loss encountered. FINDINGS: After catheter placement, the tip lies within the cavoatrial junction The catheter aspirates and flushes normally and is ready for  immediate use. IMPRESSION: Status post placement of right IJ tunneled hemodialysis catheter. Catheter ready for use. Signed, Dulcy Fanny. Earleen Newport, DO [facility] Radiology Electronically Signed   By: Corrie Mckusick D.O.   On: 11/29/2015 15:44   Scheduled Meds: . amiodarone  200 mg Oral Daily  . amLODipine  5 mg Oral QHS  . atorvastatin  80 mg Oral Daily  . bisacodyl  10 mg Rectal Once  . [START ON 12/01/2015]  ceFAZolin (ANCEF) IV  2 g Intravenous To SS-Surg  . darbepoetin (ARANESP) injection - DIALYSIS  60 mcg Intravenous Q Mon-HD  . docusate sodium  100 mg Oral BID  . ferric gluconate (FERRLECIT/NULECIT) IV  125 mg Intravenous Daily  . insulin aspart  0-15 Units Subcutaneous TID WC  . insulin aspart  0-5 Units Subcutaneous QHS  . insulin aspart protamine- aspart  15 Units Subcutaneous BID WC  . loratadine  10 mg Oral Daily  . multivitamin  1 tablet Oral QHS  . polyethylene glycol  17 g Oral BID  . protein supplement  1 scoop Oral TID WC  . sevelamer carbonate  800 mg Oral TID WC  . sodium chloride flush  10-40 mL Intracatheter Q12H  . sodium chloride flush  10-40 mL Intracatheter Q12H  . sodium chloride flush  3 mL Intravenous Q12H   Continuous Infusions: . heparin 1,100 Units/hr (11/30/15 0806)    Marzetta Board, MD, PhD [facility] Pager [phone]  If 7PM-7AM, please contact night-coverage [email or web address] Password [email or web address] 11/30/2015, 11:44 AM

## 2015-11-30 NOTE — Progress Notes (Signed)
Patient ID: Briana [name], female   DOB: Feb 08, 1946, 70 y.o.   MRN: [id number]  [facility] Progress Note   Assessment/ Plan:   1. Acute exacerbation of diastolic CHF: Improving with ultrafiltration at hemodialysis after failure to respond to diuretic therapy. Clinically, appears to be close to dry weight with improvement of shortness of breath. 2. ESRD: With acute kidney injury on chronic kidney disease stage IV--now corrected to end-stage renal disease on chronic hemodialysis.  The process is underway for outpatient dialysis unit placement. s/p conversion of temporary dialysis catheter to tunneled dialysis catheter on 7/5 and evaluation and placement of permanent dialysis access by vascular surgery- tentatively planned for BVT tomorrow. She states that she would like to undergo chronic hemodialysis in [place], [place] 3. Anemia: Hemoglobin slightly better-continue intravenous iron to replace iron deficiency and continue ESA. 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 Nutrition: Albumin is low-continue encouraging lean protein intake/oral nutritional supplements 6. Hypertension: Blood pressure is marginally elevated-anticipate will improve with ultrafiltration at hemodialysis- also with low sodium indicates still some volume overload  Subjective:   Reports to be feeling fair-states that she felt unusually weak after dialysis yesterday but notes that shortness of breath is much better.    Objective:   BP 139/56 mmHg  Pulse 53  Temp(Src) 98 F (36.7 C) (Oral)  Resp 17  Ht 5\' 4"  (1.626 m)  Wt 94.4 kg (208 lb 1.8 oz)  BMI 35.71 kg/m2  SpO2 96%  Physical Exam: Gen: Comfortable resting in bed CVS: Pulse regular bradycardia, S1 and S2 normal Resp: Decreased breath sounds over bases-no distinct rales or rhonchi Abd: Soft, obese, nontender Ext: minimal  lower extremity edema  Labs: BMET  Recent  Labs Lab 11/24/15 0610 11/25/15 [id number] 11/26/15 0435 11/27/15 0510 11/28/15 0428 11/29/15 0312 11/30/15 0655  NA 138  137 134* 129* 132* 133* 134* 133*  K 4.9  4.9 5.2* 5.1 4.4 3.5 3.6 3.5  CL 102  102 100* 96* 89* 96* 98* 100*  CO2 26  26 23 23 22 27 27 26   GLUCOSE 121*  121* 104* 140* 166* 126* 103* 166*  BUN 36*  36* 68* 84* 103* 52* 26* 34*  CREATININE 2.11*  2.10* 3.34* 3.75* 4.18* 2.81* 2.25* 2.74*  CALCIUM 8.9  8.8* 9.1 9.0 10.2 8.0* 8.1* 8.2*  PHOS 2.3* 3.3 4.3 6.0* 3.9 3.1 3.9   CBC  Recent Labs Lab 11/27/15 1555 11/28/15 0428 11/29/15 0312 11/30/15 0655  WBC 8.2 8.8 11.2* 8.1  HGB 8.3* 8.8* 9.4* 9.1*  HCT 25.6* 28.6* 30.5* 29.1*  MCV 85.3 86.9 89.2 90.4  PLT 232 269 255 240   Medications:    . amiodarone  200 mg Oral Daily  . amLODipine  5 mg Oral QHS  . atorvastatin  80 mg Oral Daily  . bisacodyl  10 mg Rectal Once  . [START ON 12/01/2015]  ceFAZolin (ANCEF) IV  2 g Intravenous To SS-Surg  . darbepoetin (ARANESP) injection - DIALYSIS  60 mcg Intravenous Q Mon-HD  . docusate sodium  100 mg Oral BID  . ferric gluconate (FERRLECIT/NULECIT) IV  125 mg Intravenous Daily  . insulin aspart  0-15 Units Subcutaneous TID WC  . insulin aspart  0-5 Units Subcutaneous QHS  . insulin aspart protamine- aspart  15 Units Subcutaneous BID WC  . loratadine  10 mg Oral Daily  . multivitamin  1 tablet Oral QHS  .  polyethylene glycol  17 g Oral BID  . protein supplement  1 scoop Oral TID WC  . sevelamer carbonate  800 mg Oral TID WC  . sodium chloride flush  10-40 mL Intracatheter Q12H  . sodium chloride flush  10-40 mL Intracatheter Q12H  . sodium chloride flush  3 mL Intravenous Q12H   Vernee Baines A   11/30/2015, 8:37 AM

## 2015-11-30 NOTE — Progress Notes (Signed)
Due to unforseen circumstances, pt's surgery for permanent HD access will be re-scheduled for Monday around lunch time.  Would continue heparin through the weekend and hold on call to the OR on Monday.   Discussed re-scheduling with the pt.  Leontine Locket, [facility] 11/30/2015 11:43 AM

## 2015-11-30 NOTE — Clinical Social Work Note (Addendum)
CSW went to ask patient if she had a family member that could transport her from [facility] to outpatient HD. She said her daughter could do it but she thought she was going home. CSW stated that there have been no known changes to discharge placement. Patient stated that she would prefer to go home because her husband, who has dementia, fell last night and laid in the floor for 2 1/2 hours. Patient said that she was at peace with this decision to go home instead of SNF. RNCM notified.  CSW signing off. Please consult if any other social work needs arise.  Dayton Scrape, [place] [phone]  4:09 pm CSW received call from Tucker, admissions coordinator at [facility] in [place]. Discussed reason for not going to SNF anymore. [name] asked that CSW notify her when patient discharges so that she can follow up with her afterwards.  Dayton Scrape, [place]

## 2015-11-30 NOTE — Procedures (Signed)
Patient was seen on dialysis and the procedure was supervised.  BFR 400  Via PC BP is  139/56.   Patient appears to be tolerating treatment well  Abimelec Grochowski A 11/30/2015

## 2015-11-30 NOTE — Progress Notes (Signed)
Pt a/o, c/o pain & anxiety, PRN meds given as ordered, pt overwhelmed with new HD edu pt on things to expect, VSS, pt stable

## 2015-11-30 NOTE — Progress Notes (Signed)
ANTICOAGULATION CONSULT NOTE - Follow Up Consult  Pharmacy Consult for heparin Indication: atrial fibrillation  Labs:  Recent Labs  11/28/15 0428 11/28/15 1824 11/29/15 0312 11/30/15 0655  HGB 8.8*  --  9.4* 9.1*  HCT 28.6*  --  30.5* 29.1*  PLT 269  --  255 240  LABPROT 20.0*  --  18.1* 17.3*  INR 1.70*  --  1.49 1.40  HEPARINUNFRC  --  0.39 0.36 0.22*  CREATININE 2.81*  --  2.25*  --      Assessment: 69yo female subtherapeutic on heparin after resumed post-procedure; may need more time to accumulate but was previously at low end of goal.  Goal of Therapy:  Heparin level 0.3-0.7 units/ml   Plan:  Will increase heparin gtt very slightly to 1100 units/hr and check level in [place], PharmD, BCPS  11/30/2015,7:39 AM

## 2015-12-01 LAB — GLUCOSE, CAPILLARY
GLUCOSE-CAPILLARY: 139 mg/dL — AB (ref 65–99)
GLUCOSE-CAPILLARY: 148 mg/dL — AB (ref 65–99)
Glucose-Capillary: 150 mg/dL — ABNORMAL HIGH (ref 65–99)
Glucose-Capillary: 160 mg/dL — ABNORMAL HIGH (ref 65–99)

## 2015-12-01 LAB — CBC
HEMATOCRIT: 33.2 % — AB (ref 36.0–46.0)
HEMOGLOBIN: 10.2 g/dL — AB (ref 12.0–15.0)
MCH: 28.2 pg (ref 26.0–34.0)
MCHC: 30.7 g/dL (ref 30.0–36.0)
MCV: 91.7 fL (ref 78.0–100.0)
Platelets: 246 10*3/uL (ref 150–400)
RBC: 3.62 MIL/uL — ABNORMAL LOW (ref 3.87–5.11)
RDW: 17.8 % — AB (ref 11.5–15.5)
WBC: 8.9 10*3/uL (ref 4.0–10.5)

## 2015-12-01 LAB — RENAL FUNCTION PANEL
ANION GAP: 10 (ref 5–15)
Albumin: 3.5 g/dL (ref 3.5–5.0)
BUN: 22 mg/dL — AB (ref 6–20)
CALCIUM: 8.3 mg/dL — AB (ref 8.9–10.3)
CO2: 26 mmol/L (ref 22–32)
CREATININE: 3.04 mg/dL — AB (ref 0.44–1.00)
Chloride: 98 mmol/L — ABNORMAL LOW (ref 101–111)
GFR calc Af Amer: 17 mL/min — ABNORMAL LOW (ref >60)
GFR calc non Af Amer: 15 mL/min — ABNORMAL LOW (ref >60)
Glucose, Bld: 131 mg/dL — ABNORMAL HIGH (ref 65–99)
Phosphorus: 3.9 mg/dL (ref 2.5–4.6)
Potassium: 3.7 mmol/L (ref 3.5–5.1)
SODIUM: 134 mmol/L — AB (ref 135–145)

## 2015-12-01 LAB — MAGNESIUM: MAGNESIUM: 2.2 mg/dL (ref 1.7–2.4)

## 2015-12-01 LAB — PROTIME-INR
INR: 1.26 (ref 0.00–1.49)
PROTHROMBIN TIME: 16 s — AB (ref 11.6–15.2)

## 2015-12-01 LAB — HEPARIN LEVEL (UNFRACTIONATED): HEPARIN UNFRACTIONATED: 0.44 [IU]/mL (ref 0.30–0.70)

## 2015-12-01 MED ORDER — CEFAZOLIN SODIUM-DEXTROSE 2-4 GM/100ML-% IV SOLN: 2.0000 g | INTRAVENOUS | Status: DC

## 2015-12-01 NOTE — Progress Notes (Signed)
ANTICOAGULATION CONSULT NOTE - Follow Up Consult  Pharmacy Consult for heparin Indication: atrial fibrillation  Allergies  Allergen Reactions  . Oxycodone Other (See Comments)    hallucinations  . Clonazepam Other (See Comments)    Unknown reaction  . Gemfibrozil Other (See Comments)    Patient is not aware of this allergy but states that she has side effects to a cholesterol medication in the past  . Carbamazepine Other (See Comments)    Reaction to tegretol - loopy  . Macrobid [Nitrofurantoin] Nausea And Vomiting    Patient Measurements: Height: 5\' 4"  (162.6 cm) Weight: 205 lb (92.987 kg) (c scale) IBW/kg (Calculated) : 54.7 Heparin Dosing Weight: 76.4 kg  Vital Signs: Temp: 98.5 Briana [name] (36.9 C) (07/07 0449) Temp Source: Oral (07/07 0449) BP: 122/47 mmHg (07/07 0449) Pulse Rate: 56 (07/07 0449)  Labs:  Recent Labs  11/29/15 0312 11/30/15 0655 11/30/15 1748 12/01/15 0418  HGB 9.4* 9.1*  --  10.2*  HCT 30.5* 29.1*  --  33.2*  PLT 255 240  --  246  LABPROT 18.1* 17.3*  --  16.0*  INR 1.49 1.40  --  1.26  HEPARINUNFRC 0.36 0.22* 0.33 0.44  CREATININE 2.25* 2.74*  --  3.04*    Estimated Creatinine Clearance: 19 mL/min (by C-G formula based on Cr of 3.04).  Assessment: 70 yo Briana [name] admitted 11/10/2015 on warfarin PTA for Afib.  Currently holding warfarin and transitioned to heparin for IR to place tunneled cath- now completed and fistula placed for HD.  However, unable to place permanent access until Monday per vascular note. Heparin drip 1100 uts/hr HL at goal 0.44.  CBC stable.      Goal of Therapy:  Heparin level 0.3-0.7 units/ml Monitor platelets by anticoagulation protocol: Yes   Plan:  - Continue heparin at 1100 units/hr - Daily HL and CBC- confirmatory level with AM labs - Briana [name]/u resumption of warfarin after permanent access placed  [employer].D. CPP, BCPS Clinical Pharmacist [phone] 12/01/2015 1:05 PM

## 2015-12-01 NOTE — Progress Notes (Signed)
Patient ID: [name], female   DOB: Sep 16, 1945, 70 y.o.   MRN: [id number]  [facility] Progress Note   Assessment/ Plan:   1. Acute exacerbation of diastolic CHF: Improving with ultrafiltration at hemodialysis after failure to respond to diuretic therapy. Clinically, appears to be close to dry weight with improvement of shortness of breath. 2. New ESRD: CLIP pending.  On THS schedule as inpatient.  s/p conversion of temporary dialysis catheter to tunneled dialysis catheter on 7/5 and plan for BVT 7/10 with VVS. She states that she would like to undergo chronic hemodialysis in [place], [place] 3. Anemia: Hemoglobin stable-continue intravenous iron to replace iron deficiency and continue ESA.  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 Nutrition: Albumin is low-continue encouraging lean protein intake/oral nutritional supplements 6. Hypertension: Blood pressure is marginally elevated-anticipate will improve with ultrafiltration at hemodialysis- also with low sodium indicates still some volume overload  Subjective:   HD yesterday, post weight 91.8kg; 2.5L UF No new events Walking in halls with PT Awaiting CLIP    Objective:   BP 122/47 mmHg  Pulse 56  Temp(Src) 98.5 F (36.9 C) (Oral)  Resp 18  Ht 5\' 4"  (1.626 m)  Wt 92.987 kg (205 lb)  BMI 35.17 kg/m2  SpO2 96%  Physical Exam: Gen: Comfortable resting in bed CVS: Pulse regular bradycardia, S1 and S2 normal Resp: Decreased breath sounds over bases-no distinct rales or rhonchi Abd: Soft, obese, nontender Ext: minimal  lower extremity edema  Labs: BMET  Recent Labs Lab 11/25/15 0655 11/26/15 0435 11/27/15 0510 11/28/15 0428 11/29/15 0312 11/30/15 0655 12/01/15 0418  NA 134* 129* 132* 133* 134* 133* 134*  K 5.2* 5.1 4.4 3.5 3.6 3.5 3.7  CL 100* 96* 89* 96* 98* 100* 98*  CO2 23 23 22 27 27 26 26   GLUCOSE 104* 140* 166* 126* 103* 166* 131*   BUN 68* 84* 103* 52* 26* 34* 22*  CREATININE 3.34* 3.75* 4.18* 2.81* 2.25* 2.74* 3.04*  CALCIUM 9.1 9.0 10.2 8.0* 8.1* 8.2* 8.3*  PHOS 3.3 4.3 6.0* 3.9 3.1 3.9 3.9   CBC  Recent Labs Lab 11/28/15 0428 11/29/15 0312 11/30/15 0655 12/01/15 0418  WBC 8.8 11.2* 8.1 8.9  HGB 8.8* 9.4* 9.1* 10.2*  HCT 28.6* 30.5* 29.1* 33.2*  MCV 86.9 89.2 90.4 91.7  PLT 269 255 240 246   Medications:    . amiodarone  200 mg Oral Daily  . amLODipine  5 mg Oral QHS  . atorvastatin  80 mg Oral Daily  . bisacodyl  10 mg Rectal Once  . [START ON 12/04/2015]  ceFAZolin (ANCEF) IV  2 g Intravenous To SS-Surg  . darbepoetin (ARANESP) injection - DIALYSIS  60 mcg Intravenous Q Mon-HD  . docusate sodium  100 mg Oral BID  . ferric gluconate (FERRLECIT/NULECIT) IV  125 mg Intravenous Daily  . insulin aspart  0-15 Units Subcutaneous TID WC  . insulin aspart  0-5 Units Subcutaneous QHS  . insulin aspart protamine- aspart  15 Units Subcutaneous BID WC  . loratadine  10 mg Oral Daily  . multivitamin  1 tablet Oral QHS  . polyethylene glycol  17 g Oral BID  . protein supplement  1 scoop Oral TID WC  . sevelamer carbonate  800 mg Oral TID WC  . sodium chloride flush  10-40 mL Intracatheter Q12H  . sodium chloride flush  10-40 mL Intracatheter Q12H  . sodium chloride flush  3 mL Intravenous Q12H   Tamla Winkels B   12/01/2015, 9:13 AM

## 2015-12-01 NOTE — Progress Notes (Signed)
Occupational Therapy Treatment and Discharge Patient Details Name: Briana [name] MRN: [id number] DOB: 1945/07/06 Today's Date: 12/01/2015    History of present illness Pt is a 70 y/o F who was just admitted last month for multi week (5/6-5/24) hospital stay that started with treatment in the ICU for PNA, followed by diuresis for CHF exacerbation. This was then complicated further by A.Fib RVR which was unstable and required cardioversion. Around that time she also had AKF develop that thankfully would resolve. Also had NSTEMI with trop elevation during that stay that was managed medically, no cath due to kidney function.Pt presents this admission w/ c/o SOB, peripheral edema, and DOE.  Admitting Dx: acute on chronic diastolic CHF.  Pt's PMH includes PVD, depression, anxiety, neuropathy, a-fib.   OT comments  Pt is performing self care and ADL transfers at a modified independent level. Educated in energy conservation strategies and reinforce with handout.  Follow Up Recommendations  No OT follow up;Supervision - Intermittent    Equipment Recommendations  None recommended by OT    Recommendations for Other Services      Precautions / Restrictions Precautions Precautions: Fall Restrictions Weight Bearing Restrictions: No       Mobility Bed Mobility               General bed mobility comments: Pt found seated in recliner upon OT entering/exiting room  Transfers Overall transfer level: Modified independent Equipment used:  (pushed IV pole)   Sit to Stand: Modified independent (Device/Increase time)         General transfer comment: from recliner    Balance     Sitting balance-Leahy Scale: Good       Standing balance-Leahy Scale: Good                     ADL Overall ADL's : Modified independent                                              Vision                     Perception     Praxis      Cognition    Behavior During Therapy: WFL for tasks assessed/performed Overall Cognitive Status: Within Functional Limits for tasks assessed                       Extremity/Trunk Assessment               Exercises     Shoulder Instructions       General Comments      Pertinent Vitals/ Pain       Pain Assessment: No/denies pain  Home Living                                          Prior Functioning/Environment              Frequency Min 2X/week     Progress Toward Goals  OT Goals(current goals can now be found in the care plan section)  Progress towards OT goals: Goals met/education completed, patient discharged from OT  Acute Rehab OT Goals Patient Stated Goal: get better  Plan Discharge plan needs to be updated  Co-evaluation                 End of Session     Activity Tolerance Patient tolerated treatment well   Patient Left in chair;with call bell/phone within reach   Nurse Communication          Time: 2787-1836 OT Time Calculation (min): 27 min  Charges: OT General Charges $OT Visit: 1 Procedure OT Treatments $Self Care/Home Management : 23-37 mins  Malka So 12/01/2015, 9:32 AM  [phone]

## 2015-12-01 NOTE — Progress Notes (Signed)
PT Cancellation Note  Patient Details Name: KALEEA CASELLA MRN: [id number] DOB: 07-20-1945   Cancelled Treatment:    Reason Eval/Treat Not Completed: Other (comment) (Pt having a meal and unavailable for therapy).  Try later as time and pt allow.   Ramond Dial 12/01/2015, 1:13 PM    Mee Hives, PT MS Acute Rehab Dept. Number: [place] [facility] [place]

## 2015-12-01 NOTE — Care Management Note (Signed)
Case Management Note  Patient Details  Name: ABAGAEL REGINO MRN: [id number] Date of Birth: 06-27-1945  Subjective/Objective:    Admitted with CHF              Action/Plan: Patient lives at home with spouse, she is refusing SNF placement and plans to return home with [facility]. [place] choice offered, pt chose [facility]; Butch Penny with [facility] called for arrangements. Patient only wants a nurse and no therapy at this time. For surgery on Monday (12/04/2015) [place] placement, then to be Clipped.  Expected Discharge Date:                  Expected Discharge Plan:  Home with [place] services In-House Referral:  Clinical Social Work  Discharge planning Services  CM Consult Choice offered to:  Patient  HH Arranged:  RN, Disease Management [place] Agency:  [place]  Status of Service:  In process, will continue to follow  Sherrilyn Rist [phone] 12/01/2015, 3:07 PM

## 2015-12-01 NOTE — Progress Notes (Signed)
PROGRESS NOTE  Briana [name] MVH:[id number] DOB: 1945/07/21 DOA: 11/10/2015 PCP: Neysa Hotter B, PA-C   LOS: 21 days    Subjective: No new complaints, fistula creation postponed.  Brief Narrative: 70 y.o. female with past medical history significant for chronic diastolic CHF (in 84/1324 TEE with EF 55%), hypertension, dyslipidemia, atrial fibrillation on anticoagulation with Coumadin, recently hospitalized 5/6-5/24 of 2017 for pneumonia which was then complicated further by A.Fib RVR which was unstable and required cardioversion. She also had NSTEMI which was managed medically and no cardiac cath due to acute kidney failure. She was discharged to inpatient rehab.   Patient presented to [facility] with worsening shortness of breath over past 2 weeks prior to this admission, or extremity edema. She was seen by primary care physician the week prior to the admission and was instructed to take Lasix twice daily whereas before she was on 40 mg once a day Lasix. There was no significant symptomatic relief for which reason she presented to ED for further evaluation.   Hospital course complicated with worsening renal function and fluid overload for which nephrology was consulted. Patient was eventually moved to ICU and was on CRRT between 6/28 and 6/30 with significant improvement in her fluid status. Transferred to telemetry 6/30. Did not respond to diuresis and was becoming more fluid overload and was started on regular HD 7/3. Tunneled cath laced on 7/5.   Assessment & Plan: Principal Problem:   Acute on chronic diastolic (congestive) heart failure (HCC) Active Problems:   Acute on chronic diastolic CHF (congestive heart failure) (HCC)   Controlled diabetes mellitus type 2 with complications (HCC)   CAD in native artery   Chronic kidney disease (CKD), stage IV (severe) (HCC)   Essential hypertension   Anemia of chronic kidney failure   Acute on chronic diastolic CHF  - patient did  not respond well initially to diuresis requiring CRRT for 72 h 6/28 - 6/30.  - Weight since 6/23: 109.7 kg --> 109.2 kg --> 108.8 kg --> 109.3 kg --> 108.1 kg >> 104.4 >> 97 kg >> 91 /// CRRT stopped > weight 94 kg >> 96 >> 96.5 until 7/3 when HD started per nephrology. - Did not respond to po Lasix with minimal UOP on 7/1 after CRRT, changed to IV Lasix on 7/2, with that she had suboptimal UOP and was started on HD 7/3 -Cardiology signed off, continue BiDil.  CKD stage 5, now ESRD. - Creatinine 3 weeks ago 2.75 - Renal following - BUN / Cr / weight continues to climb despite IV Lasix Lasix, had CRRT, did not respond to diuretics afterwards and had her first hemodialysis on 7/3 using temporary IJ catheter. She will undergo again hemodialysis on 7/4.  -Nephrology recommend to continue hemodialysis, HD tunneled catheter placed on 11/29/15 by Dr. Earleen Newport. -Vascular surgery consulted, Fistula creation scheduled initially on 7/7 but postponed to Monday 7/10. -I tried to talk to the vascular surgery PA, I was informed by the nurse in the OR that there is no provider available to do the procedure today or over the weekend, the procedure was postponed due to unforeseeable circumstances. -Cannot discharge patient because she is on heparin drip, will keep on heparin drip until Monday and start Coumadin after surgery.  Hyponatremia - 129>>132 >> 133, improved with HD  Anemia of chronic kidney disease stage 4 - Hemoglobin 7.6 >> 8.3 >> 8.8. Overall stable  Atrial fibrillation  - CHADS vasc score 5 - Recent TEE 09/2015 done  for cardioversion - Continue amiodarone 200 mg daily and metoprolol 25 mg twice daily.  - Coumadin on hold pending tunneled cath, dosing per pharmacy. 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  Consultants:   Nephrology  Cardiology - s/o 6/30  Procedures:   CRRT  HD  Antimicrobials:  None     Objective: Filed Vitals:   11/30/15 1058 11/30/15 1209 11/30/15 2148 12/01/15 0449  BP: 133/62 107/67 101/49 122/47  Pulse: 58 56 55 56  Temp: 98.2 F (36.8 C) 98.7 F (37.1 C) 98.9 F (37.2 C) 98.5 F (36.9 C)  TempSrc: Oral Oral Oral Oral  Resp: '12 18 18   ' Height:      Weight: 91.8 kg (202 lb 6.1 oz)   92.987 kg (205 lb)  SpO2: 96% 98% 95% 96%    Intake/Output Summary (Last 24 hours) at 12/01/15 1236 Last data filed at 12/01/15 0847  Gross per 24 hour  Intake    680 ml  Output    300 ml  Net    380 ml   Filed Weights   11/30/15 0650 11/30/15 1058 12/01/15 0449  Weight: 94.4 kg (208 lb 1.8 oz) 91.8 kg (202 lb 6.1 oz) 92.987 kg (205 lb)    Examination: Constitutional: NAD Filed Vitals:   11/30/15 1058 11/30/15 1209 11/30/15 2148 12/01/15 0449  BP: 133/62 107/67 101/49 122/47  Pulse: 58 56 55 56  Temp: 98.2 F (36.8 C) 98.7 F (37.1 C) 98.9 F (37.2 C) 98.5 F (36.9 C)  TempSrc: Oral Oral Oral Oral  Resp: '12 18 18   ' Height:      Weight: 91.8 kg (202 lb 6.1 oz)   92.987 kg (205 lb)  SpO2: 96% 98% 95% 96%   Eyes: PERRL, lids and conjunctivae normal Respiratory: clear to auscultation bilaterally, no wheezing, no crackles. Normal respiratory effort. No accessory muscle use.  Cardiovascular: Regular rate and rhythm, no murmurs / rubs / gallops. Trace LE edema. Abdomen: no tenderness. Bowel sounds positive.  Musculoskeletal: no clubbing /  cyanosis.  Neurologic: non focal   Data Reviewed: I have personally reviewed following labs and imaging studies  CBC:  Recent Labs Lab 11/27/15 1555 11/28/15 0428 11/29/15 0312 11/30/15 0655 12/01/15 0418  WBC 8.2 8.8 11.2* 8.1 8.9  HGB 8.3* 8.8* 9.4* 9.1* 10.2*  HCT 25.6* 28.6* 30.5* 29.1* 33.2*  MCV 85.3 86.9 89.2 90.4 91.7  PLT 232 269 255 240 917   Basic Metabolic Panel:  Recent Labs Lab 11/27/15 0510 11/28/15 0428 11/29/15 0312 11/30/15 0655 12/01/15 0418  NA 132* 133* 134* 133* 134*  K 4.4 3.5 3.6 3.5 3.7  CL 89* 96* 98* 100* 98*  CO2 '22 27 27 26 26  ' GLUCOSE 166* 126* 103* 166* 131*  BUN 103* 52* 26* 34* 22*  CREATININE 4.18* 2.81* 2.25* 2.74* 3.04*  CALCIUM 10.2 8.0* 8.1* 8.2* 8.3*  MG 2.7* 2.2 2.2 2.4 2.2  PHOS  6.0* 3.9 3.1 3.9 3.9   GFR: Estimated Creatinine Clearance: 19 mL/min (by C-G formula based on Cr of 3.04). Liver Function Tests:  Recent Labs Lab 11/26/15 0435 11/27/15 0510 11/28/15 0428 11/29/15 0312 11/30/15 0655 12/01/15 0418  AST 16  --   --   --   --   --   ALT 14  --   --   --   --   --   ALKPHOS 54  --   --   --   --   --   BILITOT 0.4  --   --   --   --   --   PROT 6.4*  --   --   --   --   --   ALBUMIN 3.2* 3.4* 3.4* 3.2* 3.2* 3.5   Coagulation Profile:  Recent Labs Lab 11/27/15 0510 11/28/15 0428 11/29/15 0312 11/30/15 0655 12/01/15 0418  INR 2.38* 1.70* 1.49 1.40 1.26   CBG:  Recent Labs Lab 11/30/15 1205 11/30/15 1608 11/30/15 2145 12/01/15 0621 12/01/15 1122  GLUCAP 157* 191* 135* 150* 160*   Anemia Panel: No results for input(s): VITAMINB12, FOLATE, FERRITIN, TIBC, IRON, RETICCTPCT in the last 72 hours. Urine analysis:    Component Value Date/Time   COLORURINE YELLOW 11/10/2015 1521   APPEARANCEUR CLEAR 11/10/2015 1521   LABSPEC 1.013 11/10/2015 1521   PHURINE 5.5 11/10/2015 1521   GLUCOSEU NEGATIVE 11/10/2015 1521   HGBUR NEGATIVE 11/10/2015 1521   BILIRUBINUR NEGATIVE 11/10/2015 1521    KETONESUR NEGATIVE 11/10/2015 1521   PROTEINUR 100* 11/10/2015 1521   UROBILINOGEN 0.2 03/02/2014 1508   NITRITE NEGATIVE 11/10/2015 1521   LEUKOCYTESUR TRACE* 11/10/2015 1521   Sepsis Labs: Invalid input(s): PROCALCITONIN, LACTICIDVEN  Recent Results (from the past 240 hour(s))  MRSA PCR Screening     Status: None   Collection Time: 11/21/15  2:16 PM  Result Value Ref Range Status   MRSA by PCR NEGATIVE NEGATIVE Final    Comment:        The GeneXpert MRSA Assay (FDA approved for NASAL specimens only), is one component of a comprehensive MRSA colonization surveillance program. It is not intended to diagnose MRSA infection nor to guide or monitor treatment for MRSA infections.     Radiology Studies: Ir Fluoro Guide Cv Line Right  11/29/2015  INDICATION: 70-year-old female with a history of renal failure EXAM: TUNNELED PICC LINE WITH ULTRASOUND AND FLUOROSCOPIC GUIDANCE MEDICATIONS: 2.0 g Ancef. The antibiotic was given in an appropriate time interval prior to skin puncture. ANESTHESIA/SEDATION: Versed 1.0 mg IV; Fentanyl 25 mcg IV; Moderate Sedation Time:  15 The patient was continuously monitored during the procedure by the interventional radiology nurse under my direct supervision. FLUOROSCOPY TIME:  Fluoroscopy Time: 0 minutes 24 seconds (2.6 mGy). COMPLICATIONS: None PROCEDURE: Informed written consent was obtained from the patient after a discussion of the risks, benefits, and alternatives to treatment. Questions regarding the procedure were encouraged and answered. The right neck and chest were prepped with chlorhexidine in a sterile fashion, and a sterile drape was applied covering the operative field. Maximum barrier sterile technique with sterile gowns and gloves were used for the procedure. A timeout was performed prior to the initiation of the procedure. After creating a small venotomy incision, a micropuncture kit was utilized to access the right internal jugular vein under  direct, real-time ultrasound guidance after the overlying soft tissues were anesthetized with 1% lidocaine with epinephrine. Ultrasound image documentation was performed. The microwire was marked to measure  appropriate internal catheter length. External tunneled length was estimated. A total tip to cuff length of 23 cm was selected. Skin and subcutaneous tissues of chest wall below the clavicle were generously infiltrated with 1% lidocaine for local anesthesia. A small stab incision was made with 11 blade scalpel. The selected hemodialysis catheter was tunneled in a retrograde fashion from the anterior chest wall to the venotomy incision. A guidewire was advanced to the level of the IVC and the micropuncture sheath was exchanged for a peel-away sheath. The catheter was then placed through the peel-away sheath with tips ultimately positioned within the superior aspect of the right atrium. Final catheter positioning was confirmed and documented with a spot radiographic image. The catheter aspirates and flushes normally. The catheter was flushed with appropriate volume heparin dwells. The catheter exit site was secured with a 0-Prolene retention suture. The venotomy incision was closed Derma bond and sterile dressing. Dressings were applied at the chest wall. The indwelling temporary HD catheter was removed. Final image was stored. Patient tolerated the procedure well and remained hemodynamically stable throughout. No complications were encountered and no significant blood loss encountered. FINDINGS: After catheter placement, the tip lies within the cavoatrial junction The catheter aspirates and flushes normally and is ready for immediate use. IMPRESSION: Status post placement of right IJ tunneled hemodialysis catheter. Catheter ready for use. Signed, Dulcy Fanny. Earleen Newport, DO [facility] Radiology Electronically Signed   By: Corrie Mckusick D.O.   On: 11/29/2015 15:44   Ir US  Guide Vasc Access Right  11/29/2015  INDICATION: 48-year-old female with a history of renal failure EXAM: TUNNELED PICC LINE WITH ULTRASOUND AND FLUOROSCOPIC GUIDANCE MEDICATIONS: 2.0 g Ancef. The antibiotic was given in an appropriate time interval prior to skin puncture. ANESTHESIA/SEDATION: Versed 1.0 mg IV; Fentanyl 25 mcg IV; Moderate Sedation Time:  15 The patient was continuously monitored during the procedure by the interventional radiology nurse under my direct supervision. FLUOROSCOPY TIME:  Fluoroscopy Time: 0 minutes 24 seconds (2.6 mGy). COMPLICATIONS: None PROCEDURE: Informed written consent was obtained from the patient after a discussion of the risks, benefits, and alternatives to treatment. Questions regarding the procedure were encouraged and answered. The right neck and chest were prepped with chlorhexidine in a sterile fashion, and a sterile drape was applied covering the operative field. Maximum barrier sterile technique with sterile gowns and gloves were used for the procedure. A timeout was performed prior to the initiation of the procedure. After creating a small venotomy incision, a micropuncture kit was utilized to access the right internal jugular vein under direct, real-time ultrasound guidance after the overlying soft tissues were anesthetized with 1% lidocaine with epinephrine. Ultrasound image documentation was performed. The microwire was marked to measure appropriate internal catheter length. External tunneled length was estimated. A total tip to cuff length of 23 cm was selected. Skin and subcutaneous tissues of chest wall below the clavicle were generously infiltrated with 1% lidocaine for local anesthesia. A small stab incision was made with 11 blade scalpel. The selected hemodialysis catheter was tunneled in a retrograde fashion from the anterior chest wall to the venotomy incision. A guidewire was advanced to the level of the IVC and the micropuncture sheath was exchanged for a  peel-away sheath. The catheter was then placed through the peel-away sheath with tips ultimately positioned within the superior aspect of the right atrium. Final catheter positioning was confirmed and documented with a spot radiographic image. The catheter aspirates and flushes normally. The  catheter was flushed with appropriate volume heparin dwells. The catheter exit site was secured with a 0-Prolene retention suture. The venotomy incision was closed Derma bond and sterile dressing. Dressings were applied at the chest wall. The indwelling temporary HD catheter was removed. Final image was stored. Patient tolerated the procedure well and remained hemodynamically stable throughout. No complications were encountered and no significant blood loss encountered. FINDINGS: After catheter placement, the tip lies within the cavoatrial junction The catheter aspirates and flushes normally and is ready for immediate use. IMPRESSION: Status post placement of right IJ tunneled hemodialysis catheter. Catheter ready for use. Signed, Dulcy Fanny. Earleen Newport, DO [facility] Radiology Electronically Signed   By: Corrie Mckusick D.O.   On: 11/29/2015 15:44   Scheduled Meds: . amiodarone  200 mg Oral Daily  . amLODipine  5 mg Oral QHS  . atorvastatin  80 mg Oral Daily  . bisacodyl  10 mg Rectal Once  . [START ON 12/04/2015]  ceFAZolin (ANCEF) IV  2 g Intravenous To SS-Surg  . darbepoetin (ARANESP) injection - DIALYSIS  60 mcg Intravenous Q Mon-HD  . docusate sodium  100 mg Oral BID  . ferric gluconate (FERRLECIT/NULECIT) IV  125 mg Intravenous Daily  . insulin aspart  0-15 Units Subcutaneous TID WC  . insulin aspart  0-5 Units Subcutaneous QHS  . insulin aspart protamine- aspart  15 Units Subcutaneous BID WC  . loratadine  10 mg Oral Daily  . multivitamin  1 tablet Oral QHS  . polyethylene glycol  17 g Oral BID  . protein supplement  1 scoop Oral TID WC  . sevelamer carbonate  800  mg Oral TID WC  . sodium chloride flush  10-40 mL Intracatheter Q12H  . sodium chloride flush  10-40 mL Intracatheter Q12H  . sodium chloride flush  3 mL Intravenous Q12H   Continuous Infusions: . heparin 1,100 Units/hr (11/30/15 1749)    Marzetta Board, MD, PhD [facility] Pager [phone]  If 7PM-7AM, please contact night-coverage [email or web address] Password [facility] 12/01/2015, 12:36 PM

## 2015-12-01 NOTE — Care Management Important Message (Signed)
Important Message  Patient Details  Name: Briana [name] MRN: [id number] Date of Birth: [birth year]   Medicare Important Message Given:  Yes    Loann Quill 12/01/2015, 10:30 AM

## 2015-12-02 LAB — RENAL FUNCTION PANEL
Albumin: 3.3 g/dL — ABNORMAL LOW (ref 3.5–5.0)
Anion gap: 13 (ref 5–15)
BUN: 43 mg/dL — AB (ref 6–20)
CALCIUM: 8.6 mg/dL — AB (ref 8.9–10.3)
CO2: 23 mmol/L (ref 22–32)
CREATININE: 3.56 mg/dL — AB (ref 0.44–1.00)
Chloride: 98 mmol/L — ABNORMAL LOW (ref 101–111)
GFR, EST AFRICAN AMERICAN: 14 mL/min — AB (ref >60)
GFR, EST NON AFRICAN AMERICAN: 12 mL/min — AB (ref >60)
Glucose, Bld: 101 mg/dL — ABNORMAL HIGH (ref 65–99)
Phosphorus: 4.3 mg/dL (ref 2.5–4.6)
Potassium: 4.1 mmol/L (ref 3.5–5.1)
SODIUM: 134 mmol/L — AB (ref 135–145)

## 2015-12-02 LAB — GLUCOSE, CAPILLARY
GLUCOSE-CAPILLARY: 151 mg/dL — AB (ref 65–99)
GLUCOSE-CAPILLARY: 234 mg/dL — AB (ref 65–99)
GLUCOSE-CAPILLARY: 110 mg/dL — AB (ref 65–99)
Glucose-Capillary: 147 mg/dL — ABNORMAL HIGH (ref 65–99)

## 2015-12-02 LAB — CBC
HEMATOCRIT: 30.5 % — AB (ref 36.0–46.0)
HEMOGLOBIN: 9.5 g/dL — AB (ref 12.0–15.0)
MCH: 28.4 pg (ref 26.0–34.0)
MCHC: 31.1 g/dL (ref 30.0–36.0)
MCV: 91 fL (ref 78.0–100.0)
Platelets: 217 10*3/uL (ref 150–400)
RBC: 3.35 MIL/uL — ABNORMAL LOW (ref 3.87–5.11)
RDW: 17.6 % — AB (ref 11.5–15.5)
WBC: 8.2 10*3/uL (ref 4.0–10.5)

## 2015-12-02 LAB — PROTIME-INR
INR: 1.21 (ref 0.00–1.49)
PROTHROMBIN TIME: 15.5 s — AB (ref 11.6–15.2)

## 2015-12-02 LAB — MAGNESIUM: MAGNESIUM: 2.4 mg/dL (ref 1.7–2.4)

## 2015-12-02 LAB — HEPARIN LEVEL (UNFRACTIONATED): HEPARIN UNFRACTIONATED: 0.26 [IU]/mL — AB (ref 0.30–0.70)

## 2015-12-02 MED ORDER — HEPARIN SODIUM (PORCINE) 1000 UNIT/ML DIALYSIS
20.0000 [IU]/kg | INTRAMUSCULAR | Status: DC | PRN
  Filled 2015-12-02: qty 2

## 2015-12-02 NOTE — Procedures (Signed)
I was present at this dialysis session. I have reviewed the session itself and made appropriate changes.   No issues. TDC Qb 400 AP ok.  UF goal 2L. 2K bath.  AVF on Monday. Next HD 7/11.    Filed Weights   11/30/15 1058 12/01/15 0449 12/02/15 0500  Weight: 91.8 kg (202 lb 6.1 oz) 92.987 kg (205 lb) 94.666 kg (208 lb 11.2 oz)     Recent Labs Lab 12/02/15 0435  NA 134*  K 4.1  CL 98*  CO2 23  GLUCOSE 101*  BUN 43*  CREATININE 3.56*  CALCIUM 8.6*  PHOS 4.3     Recent Labs Lab 11/30/15 0655 12/01/15 0418 12/02/15 0333  WBC 8.1 8.9 8.2  HGB 9.1* 10.2* 9.5*  HCT 29.1* 33.2* 30.5*  MCV 90.4 91.7 91.0  PLT 240 246 217    Scheduled Meds: . amiodarone  200 mg Oral Daily  . amLODipine  5 mg Oral QHS  . atorvastatin  80 mg Oral Daily  . bisacodyl  10 mg Rectal Once  . [START ON 12/04/2015]  ceFAZolin (ANCEF) IV  2 g Intravenous To SS-Surg  . darbepoetin (ARANESP) injection - DIALYSIS  60 mcg Intravenous Q Mon-HD  . docusate sodium  100 mg Oral BID  . ferric gluconate (FERRLECIT/NULECIT) IV  125 mg Intravenous Daily  . insulin aspart  0-15 Units Subcutaneous TID WC  . insulin aspart  0-5 Units Subcutaneous QHS  . insulin aspart protamine- aspart  15 Units Subcutaneous BID WC  . loratadine  10 mg Oral Daily  . multivitamin  1 tablet Oral QHS  . polyethylene glycol  17 g Oral BID  . protein supplement  1 scoop Oral TID WC  . sevelamer carbonate  800 mg Oral TID WC  . sodium chloride flush  10-40 mL Intracatheter Q12H  . sodium chloride flush  10-40 mL Intracatheter Q12H  . sodium chloride flush  3 mL Intravenous Q12H   Continuous Infusions: . heparin 1,200 Units/hr (12/02/15 0522)   PRN Meds:.sodium chloride, acetaminophen, diazepam, HYDROcodone-acetaminophen, hydrocortisone, ondansetron (ZOFRAN) IV, sodium chloride flush, sodium chloride flush, sodium chloride flush   Pearson Grippe  MD 12/02/2015, 8:18 AM

## 2015-12-02 NOTE — Progress Notes (Signed)
PROGRESS NOTE  Briana [name] [id number] DOB: 19-Dec-1945 DOA: 11/10/2015 PCP: Neysa Hotter B, PA-C   LOS: 22 days    Subjective: Seen earlier today when she was getting dialysis, no changes clinically.  Brief Narrative: 70 y.o. female with past medical history significant for chronic diastolic CHF (in [id number] TEE with EF 55%), hypertension, dyslipidemia, atrial fibrillation on anticoagulation with Coumadin, recently hospitalized 5/6-5/24 of 2017 for pneumonia which was then complicated further by A.Fib RVR which was unstable and required cardioversion. She also had NSTEMI which was managed medically and no cardiac cath due to acute kidney failure. She was discharged to inpatient rehab.   Patient presented to [facility] with worsening shortness of breath over past 2 weeks prior to this admission, or extremity edema. She was seen by primary care physician the week prior to the admission and was instructed to take Lasix twice daily whereas before she was on 40 mg once a day Lasix. There was no significant symptomatic relief for which reason she presented to ED for further evaluation.   Hospital course complicated with worsening renal function and fluid overload for which nephrology was consulted. Patient was eventually moved to ICU and was on CRRT between 6/28 and 6/30 with significant improvement in her fluid status. Transferred to telemetry 6/30. Did not respond to diuresis and was becoming more fluid overload and was started on regular HD 7/3. Tunneled cath laced on 7/5.   Assessment & Plan: Principal Problem:   Acute on chronic diastolic (congestive) heart failure (HCC) Active Problems:   Acute on chronic diastolic CHF (congestive heart failure) (HCC)   Controlled diabetes mellitus type 2 with complications (HCC)   CAD in native artery   Chronic kidney disease (CKD), stage IV (severe) (HCC)   Essential hypertension   Anemia of chronic kidney failure   Acute on chronic  diastolic CHF  - patient did not respond well initially to diuresis requiring CRRT for 72 h 6/28 - 6/30.  - Weight since 6/23: 109.7 kg --> 109.2 kg --> 108.8 kg --> 109.3 kg --> 108.1 kg >> 104.4 >> 97 kg >> 91 /// CRRT stopped > weight 94 kg >> 96 >> 96.5 until 7/3 when HD started per nephrology. - Did not respond to po Lasix with minimal UOP on 7/1 after CRRT, changed to IV Lasix on 7/2, with that she had suboptimal UOP and was started on HD 7/3 -Cardiology signed off, continue BiDil.  CKD stage 5, now ESRD. - Creatinine 3 weeks ago 2.75 - Renal following - BUN / Cr / weight continues to climb despite IV Lasix Lasix, had CRRT, did not respond to diuretics afterwards and had her first hemodialysis on 7/3 using temporary IJ catheter. She will undergo again hemodialysis on 7/4.  -Nephrology recommend to continue hemodialysis, HD tunneled catheter placed on 11/29/15 by Dr. Earleen Newport. -Vascular surgery consulted, Fistula creation scheduled initially on 7/7 but postponed to Monday 7/10. -I tried to talk to the vascular surgery PA, I was informed by the nurse in the OR that there is no provider available to do the procedure today or over the weekend, the procedure was postponed due to unforeseeable circumstances. -Cannot discharge patient because she is on heparin drip, will keep on heparin drip until Monday and start Coumadin after surgery. -No changes clinically.  Hyponatremia - 129>>132 >> 133, improved with HD  Anemia of chronic kidney disease stage 4 - Hemoglobin 7.6 >> 8.3 >> 8.8. Overall stable  Atrial fibrillation  - CHADS  vasc score 5 - Recent TEE 09/2015 done for cardioversion - Continue amiodarone 200 mg daily and metoprolol 25 mg twice daily.  - Coumadin on hold pending tunneled cath, dosing per pharmacy. 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 -  Continue 70/30 mix 15 units twice daily - Continue sliding-scale insulin - CBGs controlled 126-139  Dyslipidemia associated with type 2 diabetes mellitus - Continue Lipitor 80 mg at bedtime - Continue daily aspirin   Essential hypertension - Continue Norvasc 10 mg daily, Bidil TID - hold metoprolol for now due to bradycardia   DVT prophylaxis: Coumadin Code Status: Full Family Communication: no family bedside Disposition Plan: Home after fistula creation and INR gets therapeutic  Consultants:   Nephrology  Cardiology - s/o 6/30  Procedures:   CRRT  HD  Antimicrobials:  None     Objective: Filed Vitals:   12/02/15 1000 12/02/15 1030 12/02/15 1100 12/02/15 1127  BP: 137/68 120/48 134/63 154/70  Pulse: 57 55 55 51  Temp:    98 F (36.7 C)  TempSrc:    Oral  Resp: 16 14 13 11   Height:      Weight:    93.3 kg (205 lb 11 oz)  SpO2:        Intake/Output Summary (Last 24 hours) at 12/02/15 1303 Last data filed at 12/02/15 1127  Gross per 24 hour  Intake    988 ml  Output   2100 ml  Net  -1112 ml   Filed Weights   12/02/15 0500 12/02/15 0757 12/02/15 1127  Weight: 94.666 kg (208 lb 11.2 oz) 95.3 kg (210 lb 1.6 oz) 93.3 kg (205 lb 11 oz)    Examination: Constitutional: NAD Filed Vitals:   12/02/15 1000 12/02/15 1030 12/02/15 1100 12/02/15 1127  BP: 137/68 120/48 134/63 154/70  Pulse: 57 55 55 51  Temp:    98 F (36.7 C)  TempSrc:    Oral  Resp: 16 14 13 11   Height:      Weight:    93.3 kg (205 lb 11 oz)  SpO2:       Eyes: PERRL, lids and conjunctivae normal Respiratory: clear to auscultation bilaterally, no wheezing, no crackles. Normal respiratory effort. No accessory muscle use.  Cardiovascular: Regular rate and rhythm, no murmurs / rubs / gallops. Trace LE edema. Abdomen: no tenderness. Bowel sounds positive.  Musculoskeletal: no clubbing / cyanosis.  Neurologic: non focal   Data Reviewed: I have personally reviewed following labs and imaging  studies  CBC:  Recent Labs Lab 11/28/15 0428 11/29/15 0312 11/30/15 0655 12/01/15 0418 12/02/15 0333  WBC 8.8 11.2* 8.1 8.9 8.2  HGB 8.8* 9.4* 9.1* 10.2* 9.5*  HCT 28.6* 30.5* 29.1* 33.2* 30.5*  MCV 86.9 89.2 90.4 91.7 91.0  PLT 269 255 240 246 [id number]   Basic Metabolic Panel:  Recent Labs Lab 11/28/15 0428 11/29/15 0312 11/30/15 0655 12/01/15 0418 12/02/15 0333 12/02/15 0435  NA 133* 134* 133* 134*  --  134*  K 3.5 3.6 3.5 3.7  --  4.1  CL 96* 98* 100* 98*  --  98*  CO2 27 27 26 26   --  23  GLUCOSE 126* 103* 166* 131*  --  101*  BUN 52* 26* 34* 22*  --  43*  CREATININE 2.81* 2.25* 2.74* 3.04*  --  3.56*  CALCIUM 8.0* 8.1* 8.2* 8.3*  --  8.6*  MG 2.2 2.2 2.4 2.2 2.4  --   PHOS 3.9 3.1 3.9 3.9  --  4.3   GFR: Estimated Creatinine Clearance: 16.3 mL/min (by C-G formula based on Cr of 3.56). Liver Function Tests:  Recent Labs Lab 11/26/15 0435  11/28/15 0428 11/29/15 0312 11/30/15 0655 12/01/15 0418 12/02/15 0435  AST 16  --   --   --   --   --   --   ALT 14  --   --   --   --   --   --   ALKPHOS 54  --   --   --   --   --   --   BILITOT 0.4  --   --   --   --   --   --   PROT 6.4*  --   --   --   --   --   --   ALBUMIN 3.2*  < > 3.4* 3.2* 3.2* 3.5 3.3*  < > = values in this interval not displayed. Coagulation Profile:  Recent Labs Lab 11/28/15 0428 11/29/15 0312 11/30/15 0655 12/01/15 0418 12/02/15 0333  INR 1.70* 1.49 1.40 1.26 1.21   CBG:  Recent Labs Lab 12/01/15 1122 12/01/15 1700 12/01/15 2106 12/02/15 0642 12/02/15 1228  GLUCAP 160* 139* 148* 147* 151*   Anemia Panel: No results for input(s): VITAMINB12, FOLATE, FERRITIN, TIBC, IRON, RETICCTPCT in the last 72 hours. Urine analysis:    Component Value Date/Time   COLORURINE YELLOW 11/10/2015 1521   APPEARANCEUR CLEAR 11/10/2015 1521   LABSPEC 1.013 11/10/2015 1521   PHURINE 5.5 11/10/2015 1521   GLUCOSEU NEGATIVE 11/10/2015 1521   HGBUR NEGATIVE 11/10/2015 1521   BILIRUBINUR  NEGATIVE 11/10/2015 1521   KETONESUR NEGATIVE 11/10/2015 1521   PROTEINUR 100* 11/10/2015 1521   UROBILINOGEN 0.2 03/02/2014 1508   NITRITE NEGATIVE 11/10/2015 1521   LEUKOCYTESUR TRACE* 11/10/2015 1521   Sepsis Labs: Invalid input(s): PROCALCITONIN, LACTICIDVEN  No results found for this or any previous visit (from the past 240 hour(s)).  Radiology Studies: No results found. Scheduled Meds: . amiodarone  200 mg Oral Daily  . amLODipine  5 mg Oral QHS  . atorvastatin  80 mg Oral Daily  . bisacodyl  10 mg Rectal Once  . [START ON 12/04/2015]  ceFAZolin (ANCEF) IV  2 g Intravenous To SS-Surg  . darbepoetin (ARANESP) injection - DIALYSIS  60 mcg Intravenous Q Mon-HD  . docusate sodium  100 mg Oral BID  . ferric gluconate (FERRLECIT/NULECIT) IV  125 mg Intravenous Daily  . insulin aspart  0-15 Units Subcutaneous TID WC  . insulin aspart  0-5 Units Subcutaneous QHS  . insulin aspart protamine- aspart  15 Units Subcutaneous BID WC  . loratadine  10 mg Oral Daily  . multivitamin  1 tablet Oral QHS  . polyethylene glycol  17 g Oral BID  . protein supplement  1 scoop Oral TID WC  . sevelamer carbonate  800 mg Oral TID WC  . sodium chloride flush  10-40 mL Intracatheter Q12H  . sodium chloride flush  10-40 mL Intracatheter Q12H  . sodium chloride flush  3 mL Intravenous Q12H   Continuous Infusions: . heparin 1,200 Units/hr (12/02/15 0522)    Marzetta Board, MD, PhD [facility] Pager [phone]  If 7PM-7AM, please contact night-coverage [email or web address] Password [email or web address] 12/02/2015, 1:03 PM

## 2015-12-02 NOTE — Progress Notes (Signed)
[place] for heparin Indication: atrial fibrillation  Allergies  Allergen Reactions  . Oxycodone Other (See Comments)    hallucinations  . Clonazepam Other (See Comments)    Unknown reaction  . Gemfibrozil Other (See Comments)    Patient is not aware of this allergy but states that she has side effects to a cholesterol medication in the past  . Carbamazepine Other (See Comments)    Reaction to tegretol - loopy  . Macrobid [Nitrofurantoin] Nausea And Vomiting    Patient Measurements: Height: 5\' 4"  (162.6 cm) Weight: 205 lb (92.987 kg) (c scale) IBW/kg (Calculated) : 54.7 Heparin Dosing Weight: 76.4 kg  Vital Signs: Temp: 98.4 F (36.9 C) (07/07 2052) Temp Source: Oral (07/07 2052) BP: 138/52 mmHg (07/07 2052) Pulse Rate: 58 (07/07 2052)  Labs:  Recent Labs  11/30/15 [id number] 11/30/15 1748 12/01/15 0418 12/02/15 0333 12/02/15 0437  HGB 9.1*  --  10.2* 9.5*  --   HCT 29.1*  --  33.2* 30.5*  --   PLT 240  --  246 217  --   LABPROT 17.3*  --  16.0* 15.5*  --   INR 1.40  --  1.26 1.21  --   HEPARINUNFRC 0.22* 0.33 0.44  --  0.26*  CREATININE 2.74*  --  3.04*  --   --     Estimated Creatinine Clearance: 19 mL/min (by C-G formula based on Cr of 3.04).  Assessment: 70 y.o. female with h/o Afib, Coumadin on hold, for heparin  Goal of Therapy:  Heparin level 0.3-0.7 units/ml Monitor platelets by anticoagulation protocol: Yes   Plan:  Increase Heparin 1200 units/hr  Phillis Knack, PharmD, BCPS   12/02/2015 5:21 AM

## 2015-12-03 LAB — RENAL FUNCTION PANEL
Albumin: 3.2 g/dL — ABNORMAL LOW (ref 3.5–5.0)
Anion gap: 12 (ref 5–15)
BUN: 30 mg/dL — AB (ref 6–20)
CHLORIDE: 96 mmol/L — AB (ref 101–111)
CO2: 23 mmol/L (ref 22–32)
CREATININE: 3.27 mg/dL — AB (ref 0.44–1.00)
Calcium: 8.6 mg/dL — ABNORMAL LOW (ref 8.9–10.3)
GFR calc Af Amer: 15 mL/min — ABNORMAL LOW (ref >60)
GFR, EST NON AFRICAN AMERICAN: 13 mL/min — AB (ref >60)
GLUCOSE: 250 mg/dL — AB (ref 65–99)
Phosphorus: 3.7 mg/dL (ref 2.5–4.6)
Potassium: 4.3 mmol/L (ref 3.5–5.1)
Sodium: 131 mmol/L — ABNORMAL LOW (ref 135–145)

## 2015-12-03 LAB — CBC
HEMATOCRIT: 32.5 % — AB (ref 36.0–46.0)
HEMOGLOBIN: 9.8 g/dL — AB (ref 12.0–15.0)
MCH: 28.1 pg (ref 26.0–34.0)
MCHC: 30.2 g/dL (ref 30.0–36.0)
MCV: 93.1 fL (ref 78.0–100.0)
Platelets: 182 10*3/uL (ref 150–400)
RBC: 3.49 MIL/uL — ABNORMAL LOW (ref 3.87–5.11)
RDW: 17.9 % — AB (ref 11.5–15.5)
WBC: 6.6 10*3/uL (ref 4.0–10.5)

## 2015-12-03 LAB — GLUCOSE, CAPILLARY
GLUCOSE-CAPILLARY: 118 mg/dL — AB (ref 65–99)
GLUCOSE-CAPILLARY: 140 mg/dL — AB (ref 65–99)
GLUCOSE-CAPILLARY: 186 mg/dL — AB (ref 65–99)
Glucose-Capillary: 132 mg/dL — ABNORMAL HIGH (ref 65–99)

## 2015-12-03 LAB — PROTIME-INR
INR: 1.21 (ref 0.00–1.49)
Prothrombin Time: 15.4 seconds — ABNORMAL HIGH (ref 11.6–15.2)

## 2015-12-03 LAB — MAGNESIUM: Magnesium: 2.2 mg/dL (ref 1.7–2.4)

## 2015-12-03 LAB — HEPARIN LEVEL (UNFRACTIONATED): Heparin Unfractionated: 0.36 IU/mL (ref 0.30–0.70)

## 2015-12-03 MED ORDER — CEFUROXIME SODIUM 1.5 G IJ SOLR
1.5000 g | INTRAMUSCULAR | Status: AC
  Administered 2015-12-04: 1.5 g via INTRAVENOUS
  Filled 2015-12-03: qty 1.5

## 2015-12-03 NOTE — Progress Notes (Signed)
Patient ID: Briana [name], female   DOB: Aug 18, 1945, 70 y.o.   MRN: [id number] Scheduled for left basilic vein transposition tomorrow. Nothing by mouth after midnight. Again discussed the procedure with the patient who understands plan to do transposition and fistula in the same setting rather than stage. If her vein is marginal will Staged fistula and transposition at a later date

## 2015-12-03 NOTE — Progress Notes (Signed)
PROGRESS NOTE  Briana [name] [id number] DOB: 1945-09-18 DOA: 11/10/2015 PCP: Neysa Hotter B, PA-C   LOS: 23 days    Subjective: Seen earlier today, denies any new complaints, no changes clinically. Await fistula creation in a.m.  Brief Narrative: 70 y.o. female with past medical history significant for chronic diastolic CHF (in [id number] TEE with EF 55%), hypertension, dyslipidemia, atrial fibrillation on anticoagulation with Coumadin, recently hospitalized 5/6-5/24 of 2017 for pneumonia which was then complicated further by A.Fib RVR which was unstable and required cardioversion. She also had NSTEMI which was managed medically and no cardiac cath due to acute kidney failure. She was discharged to inpatient rehab.   Patient presented to [facility] with worsening shortness of breath over past 2 weeks prior to this admission, or extremity edema. She was seen by primary care physician the week prior to the admission and was instructed to take Lasix twice daily whereas before she was on 40 mg once a day Lasix. There was no significant symptomatic relief for which reason she presented to ED for further evaluation.   Hospital course complicated with worsening renal function and fluid overload for which nephrology was consulted. Patient was eventually moved to ICU and was on CRRT between 6/28 and 6/30 with significant improvement in her fluid status. Transferred to telemetry 6/30. Did not respond to diuresis and was becoming more fluid overload and was started on regular HD 7/3. Tunneled cath laced on 7/5.   Assessment & Plan: Principal Problem:   Acute on chronic diastolic (congestive) heart failure (HCC) Active Problems:   Acute on chronic diastolic CHF (congestive heart failure) (HCC)   Controlled diabetes mellitus type 2 with complications (HCC)   CAD in native artery   Chronic kidney disease (CKD), stage IV (severe) (HCC)   Essential hypertension   Anemia of chronic kidney  failure   Acute on chronic diastolic CHF  - patient did not respond well initially to diuresis requiring CRRT for 72 h 6/28 - 6/30.  - Weight since 6/23: 109.7 kg --> 109.2 kg --> 108.8 kg --> 109.3 kg --> 108.1 kg >> 104.4 >> 97 kg >> 91 /// CRRT stopped > weight 94 kg >> 96 >> 96.5 until 7/3 when HD started per nephrology. - Did not respond to po Lasix with minimal UOP on 7/1 after CRRT, changed to IV Lasix on 7/2, with that she had suboptimal UOP and was started on HD 7/3 -Cardiology signed off, continue BiDil.  CKD stage 5, now ESRD. - Creatinine 3 weeks ago 2.75 - Renal following - BUN / Cr / weight continues to climb despite IV Lasix Lasix, had CRRT, did not respond to diuretics afterwards and had her first hemodialysis on 7/3 using temporary IJ catheter. She will undergo again hemodialysis on 7/4.  -Nephrology recommend to continue hemodialysis, HD tunneled catheter placed on 11/29/15 by Dr. Earleen Newport. -Vascular surgery consulted, Fistula creation scheduled initially on 7/7 but postponed to Monday 7/10. -I tried to talk to the vascular surgery PA, I was informed by the nurse in the OR that there is no provider available to do the procedure today or over the weekend, the procedure was postponed due to unforeseeable circumstances. -Cannot discharge patient because she is on heparin drip, will keep on heparin drip until Monday and start Coumadin after surgery. -Left basilic vein transposition tomorrow to be done by Dr. Donnetta Hutching.  Hyponatremia - 129>>132 >> 133, improved with HD  Anemia of chronic kidney disease stage 4 - Hemoglobin 7.6 >>  8.3 >> 8.8. Overall stable  Atrial fibrillation  - CHADS vasc score 5 - Recent TEE 09/2015 done for cardioversion - Continue amiodarone 200 mg daily and metoprolol 25 mg twice daily.  - Coumadin on hold pending tunneled cath, dosing per pharmacy. Now on heparin gtt given INR < 2 - will need to be bridged back once vascular procedures completed  Diabetes  mellitus with diabetic nephropathy with long-term insulin use - Continue 70/30 mix 15 units twice daily - Continue sliding-scale insulin - CBGs controlled 126-139  Dyslipidemia associated with type 2 diabetes mellitus - Continue Lipitor 80 mg at bedtime - Continue daily aspirin   Essential hypertension - Continue Norvasc 10 mg daily, Bidil TID - hold metoprolol for now due to bradycardia   DVT prophylaxis: Coumadin Code Status: Full Family Communication: no family bedside Disposition Plan: Home after fistula creation and INR gets therapeutic  Consultants:   Nephrology  Cardiology - s/o 6/30  Procedures:   CRRT  HD  Antimicrobials:  None     Objective: Filed Vitals:   12/02/15 1230 12/02/15 2200 12/03/15 0655 12/03/15 1205  BP: 117/32 123/52 145/50 120/38  Pulse: 61  52 57  Temp: 98.4 F (36.9 C) 98.2 F (36.8 C) 98.7 F (37.1 C) 98.4 F (36.9 C)  TempSrc: Oral Oral Oral Oral  Resp: 18     Height:      Weight:   93.713 kg (206 lb 9.6 oz)   SpO2: 99% 94% 100%     Intake/Output Summary (Last 24 hours) at 12/03/15 1322 Last data filed at 12/03/15 1255  Gross per 24 hour  Intake 1100.63 ml  Output      0 ml  Net 1100.63 ml   Filed Weights   12/02/15 0757 12/02/15 1127 12/03/15 0655  Weight: 95.3 kg (210 lb 1.6 oz) 93.3 kg (205 lb 11 oz) 93.713 kg (206 lb 9.6 oz)    Examination: Constitutional: NAD Filed Vitals:   12/02/15 1230 12/02/15 2200 12/03/15 0655 12/03/15 1205  BP: 117/32 123/52 145/50 120/38  Pulse: 61  52 57  Temp: 98.4 F (36.9 C) 98.2 F (36.8 C) 98.7 F (37.1 C) 98.4 F (36.9 C)  TempSrc: Oral Oral Oral Oral  Resp: 18     Height:      Weight:   93.713 kg (206 lb 9.6 oz)   SpO2: 99% 94% 100%    Eyes: PERRL, lids and conjunctivae normal Respiratory: clear to auscultation bilaterally, no wheezing, no crackles. Normal respiratory effort. No accessory muscle use.  Cardiovascular: Regular rate and rhythm, no murmurs / rubs /  gallops. Trace LE edema. Abdomen: no tenderness. Bowel sounds positive.  Musculoskeletal: no clubbing / cyanosis.  Neurologic: non focal   Data Reviewed: I have personally reviewed following labs and imaging studies  CBC:  Recent Labs Lab 11/29/15 0312 11/30/15 0655 12/01/15 0418 12/02/15 0333 12/03/15 0927  WBC 11.2* 8.1 8.9 8.2 6.6  HGB 9.4* 9.1* 10.2* 9.5* 9.8*  HCT 30.5* 29.1* 33.2* 30.5* 32.5*  MCV 89.2 90.4 91.7 91.0 93.1  PLT 255 240 246 217 [id number]   Basic Metabolic Panel:  Recent Labs Lab 11/29/15 0312 11/30/15 0655 12/01/15 0418 12/02/15 0333 12/02/15 0435 12/03/15 0927  NA 134* 133* 134*  --  134* 131*  K 3.6 3.5 3.7  --  4.1 4.3  CL 98* 100* 98*  --  98* 96*  CO2 27 26 26   --  23 23  GLUCOSE 103* 166* 131*  --  101* 250*  BUN 26* 34* 22*  --  43* 30*  CREATININE 2.25* 2.74* 3.04*  --  3.56* 3.27*  CALCIUM 8.1* 8.2* 8.3*  --  8.6* 8.6*  MG 2.2 2.4 2.2 2.4  --  2.2  PHOS 3.1 3.9 3.9  --  4.3 3.7   GFR: Estimated Creatinine Clearance: 17.8 mL/min (by C-G formula based on Cr of 3.27). Liver Function Tests:  Recent Labs Lab 11/29/15 0312 11/30/15 0655 12/01/15 0418 12/02/15 0435 12/03/15 0927  ALBUMIN 3.2* 3.2* 3.5 3.3* 3.2*   Coagulation Profile:  Recent Labs Lab 11/29/15 0312 11/30/15 0655 12/01/15 0418 12/02/15 0333 12/03/15 0927  INR 1.49 1.40 1.26 1.21 1.21   CBG:  Recent Labs Lab 12/02/15 1228 12/02/15 1641 12/02/15 2058 12/03/15 0642 12/03/15 1139  GLUCAP 151* 234* 110* 118* 140*   Anemia Panel: No results for input(s): VITAMINB12, FOLATE, FERRITIN, TIBC, IRON, RETICCTPCT in the last 72 hours. Urine analysis:    Component Value Date/Time   COLORURINE YELLOW 11/10/2015 1521   APPEARANCEUR CLEAR 11/10/2015 1521   LABSPEC 1.013 11/10/2015 1521   PHURINE 5.5 11/10/2015 1521   GLUCOSEU NEGATIVE 11/10/2015 1521   HGBUR NEGATIVE 11/10/2015 1521   BILIRUBINUR NEGATIVE 11/10/2015 1521   KETONESUR NEGATIVE 11/10/2015 1521    PROTEINUR 100* 11/10/2015 1521   UROBILINOGEN 0.2 03/02/2014 1508   NITRITE NEGATIVE 11/10/2015 1521   LEUKOCYTESUR TRACE* 11/10/2015 1521   Sepsis Labs: Invalid input(s): PROCALCITONIN, LACTICIDVEN  No results found for this or any previous visit (from the past 240 hour(s)).  Radiology Studies: No results found. Scheduled Meds: . amiodarone  200 mg Oral Daily  . amLODipine  5 mg Oral QHS  . atorvastatin  80 mg Oral Daily  . bisacodyl  10 mg Rectal Once  . [START ON 12/04/2015]  ceFAZolin (ANCEF) IV  2 g Intravenous To SS-Surg  . [START ON 12/04/2015] cefUROXime (ZINACEF)  IV  1.5 g Intravenous On Call to OR  . darbepoetin (ARANESP) injection - DIALYSIS  60 mcg Intravenous Q Mon-HD  . docusate sodium  100 mg Oral BID  . ferric gluconate (FERRLECIT/NULECIT) IV  125 mg Intravenous Daily  . insulin aspart  0-15 Units Subcutaneous TID WC  . insulin aspart  0-5 Units Subcutaneous QHS  . insulin aspart protamine- aspart  15 Units Subcutaneous BID WC  . loratadine  10 mg Oral Daily  . multivitamin  1 tablet Oral QHS  . polyethylene glycol  17 g Oral BID  . protein supplement  1 scoop Oral TID WC  . sevelamer carbonate  800 mg Oral TID WC  . sodium chloride flush  10-40 mL Intracatheter Q12H  . sodium chloride flush  10-40 mL Intracatheter Q12H  . sodium chloride flush  3 mL Intravenous Q12H   Continuous Infusions: . heparin 1,200 Units/hr (12/03/15 [id number])    Marzetta Board, MD, PhD [facility] Pager [phone]  If 7PM-7AM, please contact night-coverage [email or web address] Password [email or web address] 12/03/2015, 1:22 PM

## 2015-12-03 NOTE — Progress Notes (Signed)
Patient ID: Briana [name], female   DOB: 03-28-1946, 70 y.o.   MRN: [id number]  [facility] Progress Note   Assessment/ Plan:   1. Acute exacerbation of diastolic CHF: Improved with ultrafiltration at hemodialysis after failure to respond to diuretic therapy. Now is euvolemic on HD 2. New ESRD: CLIP pending ([facility] to [facility] now?)  On [facility] schedule as inpatient.  s/p conversion of temporary dialysis catheter to tunneled dialysis catheter on 7/5 and plan for BVT 7/10 with VVS. She states that she would like to undergo chronic hemodialysis in [place], [place] -- She tells me unit is full and now might be in [place] which is ok with her.  3. Anemia: Hemoglobin stable-rec intravenous iron to replace iron deficiency and continue ESA.  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 Nutrition: Albumin is low-continue encouraging lean protein intake/oral nutritional supplements 6. Hypertension: Blood pressure is marginally elevated-anticipate will improve with ultrafiltration at hemodialysis- also with low sodium indicates still some volume overload  Subjective:   No new events HD yesterday: post weight 93.3kg, 2L UF; tolerated well    Objective:   BP 145/50 mmHg  Pulse 52  Temp(Src) 98.7 F (37.1 C) (Oral)  Resp 18  Ht 5\' 4"  (1.626 m)  Wt 93.713 kg (206 lb 9.6 oz)  BMI 35.45 kg/m2  SpO2 100%  Physical Exam: Gen: Comfortable in chair CVS: Pulse regular bradycardia, S1 and S2 normal Resp: Decreased breath sounds over bases-no distinct rales or rhonchi Abd: Soft, obese, nontender Ext: minimal  lower extremity edema  Labs: BMET  Recent Labs Lab 11/27/15 0510 11/28/15 0428 11/29/15 0312 11/30/15 0655 12/01/15 0418 12/02/15 0435  NA 132* 133* 134* 133* 134* 134*  K 4.4 3.5 3.6 3.5 3.7 4.1  CL 89* 96* 98* 100* 98* 98*  CO2 22 27 27 26 26 23   GLUCOSE 166* 126* 103* 166* 131* 101*  BUN 103* 52* 26* 34* 22*  43*  CREATININE 4.18* 2.81* 2.25* 2.74* 3.04* 3.56*  CALCIUM 10.2 8.0* 8.1* 8.2* 8.3* 8.6*  PHOS 6.0* 3.9 3.1 3.9 3.9 4.3   CBC  Recent Labs Lab 11/29/15 0312 11/30/15 0655 12/01/15 0418 12/02/15 0333  WBC 11.2* 8.1 8.9 8.2  HGB 9.4* 9.1* 10.2* 9.5*  HCT 30.5* 29.1* 33.2* 30.5*  MCV 89.2 90.4 91.7 91.0  PLT 255 240 246 217   Medications:    . amiodarone  200 mg Oral Daily  . amLODipine  5 mg Oral QHS  . atorvastatin  80 mg Oral Daily  . bisacodyl  10 mg Rectal Once  . [START ON 12/04/2015]  ceFAZolin (ANCEF) IV  2 g Intravenous To SS-Surg  . [START ON 12/04/2015] cefUROXime (ZINACEF)  IV  1.5 g Intravenous On Call to OR  . darbepoetin (ARANESP) injection - DIALYSIS  60 mcg Intravenous Q Mon-HD  . docusate sodium  100 mg Oral BID  . ferric gluconate (FERRLECIT/NULECIT) IV  125 mg Intravenous Daily  . insulin aspart  0-15 Units Subcutaneous TID WC  . insulin aspart  0-5 Units Subcutaneous QHS  . insulin aspart protamine- aspart  15 Units Subcutaneous BID WC  . loratadine  10 mg Oral Daily  . multivitamin  1 tablet Oral QHS  . polyethylene glycol  17 g Oral BID  . protein supplement  1 scoop Oral TID WC  . sevelamer carbonate  800 mg Oral TID WC  . sodium chloride flush  10-40 mL Intracatheter Q12H  .  sodium chloride flush  10-40 mL Intracatheter Q12H  . sodium chloride flush  3 mL Intravenous Q12H   Briana [name] B   12/03/2015, 7:43 AM

## 2015-12-03 NOTE — Progress Notes (Signed)
ANTICOAGULATION CONSULT NOTE - Follow Up Consult  Pharmacy Consult for heparin Indication: atrial fibrillation  Allergies  Allergen Reactions  . Oxycodone Other (See Comments)    hallucinations  . Clonazepam Other (See Comments)    Unknown reaction  . Gemfibrozil Other (See Comments)    Patient is not aware of this allergy but states that she has side effects to a cholesterol medication in the past  . Carbamazepine Other (See Comments)    Reaction to tegretol - loopy  . Macrobid [Nitrofurantoin] Nausea And Vomiting    Patient Measurements: Height: 5\' 4"  (162.6 cm) Weight: 206 lb 9.6 oz (93.713 kg) (c scale) IBW/kg (Calculated) : 54.7 Heparin Dosing Weight: 76.4 kg  Vital Signs: Temp: 98.4 F (36.9 C) (07/09 1205) Temp Source: Oral (07/09 1205) BP: 120/38 mmHg (07/09 1205) Pulse Rate: 57 (07/09 1205)  Labs:  Recent Labs  12/01/15 0418 12/02/15 0333 12/02/15 0435 12/02/15 0437 12/03/15 0927  HGB 10.2* 9.5*  --   --  9.8*  HCT 33.2* 30.5*  --   --  32.5*  PLT 246 217  --   --  182  LABPROT 16.0* 15.5*  --   --  15.4*  INR 1.26 1.21  --   --  1.21  HEPARINUNFRC 0.44  --   --  0.26* 0.36  CREATININE 3.04*  --  3.56*  --  3.27*    Estimated Creatinine Clearance: 17.8 mL/min (by C-G formula based on Cr of 3.27).  Assessment: 70 yo F admitted 11/10/2015 on warfarin PTA for Afib.  Currently holding warfarin and transitioned to heparin for IR to place tunneled cath- now completed and fistula placed for HD.  However, unable to place permanent access until Monday per vascular note. Heparin drip 1200 uts/hr HL at goal 0.36.  CBC stable.     Goal of Therapy:  Heparin level 0.3-0.7 units/ml Monitor platelets by anticoagulation protocol: Yes   Plan:  - Continue heparin at 1200 units/hr - Daily HL and CBC- confirmatory level with AM labs - F/u resumption of warfarin after permanent access placed  [employer].D. CPP, BCPS Clinical  Pharmacist [phone] 12/03/2015 1:01 PM

## 2015-12-04 ENCOUNTER — Encounter (HOSPITAL_COMMUNITY): Payer: Self-pay | Admitting: Certified Registered"

## 2015-12-04 ENCOUNTER — Inpatient Hospital Stay (HOSPITAL_COMMUNITY): Payer: Medicare Other | Admitting: Certified Registered"

## 2015-12-04 ENCOUNTER — Encounter (HOSPITAL_COMMUNITY)
Admission: EM | Disposition: A | Discharge status: Home Health | Payer: Self-pay | Source: Home / Self Care | Attending: Internal Medicine

## 2015-12-04 DIAGNOSIS — N185 Chronic kidney disease, stage 5: Secondary | ICD-10-CM

## 2015-12-04 HISTORY — PX: BASCILIC VEIN TRANSPOSITION: SHX5742

## 2015-12-04 LAB — GLUCOSE, CAPILLARY
GLUCOSE-CAPILLARY: 119 mg/dL — AB (ref 65–99)
GLUCOSE-CAPILLARY: 125 mg/dL — AB (ref 65–99)
Glucose-Capillary: 143 mg/dL — ABNORMAL HIGH (ref 65–99)
Glucose-Capillary: 158 mg/dL — ABNORMAL HIGH (ref 65–99)
Glucose-Capillary: 175 mg/dL — ABNORMAL HIGH (ref 65–99)
Glucose-Capillary: 146 mg/dL — ABNORMAL HIGH (ref 65–99)

## 2015-12-04 LAB — PROTIME-INR
INR: 1.16 (ref 0.00–1.49)
PROTHROMBIN TIME: 14.9 s (ref 11.6–15.2)

## 2015-12-04 LAB — RENAL FUNCTION PANEL
ALBUMIN: 3.1 g/dL — AB (ref 3.5–5.0)
ANION GAP: 12 (ref 5–15)
BUN: 47 mg/dL — AB (ref 6–20)
CALCIUM: 8.7 mg/dL — AB (ref 8.9–10.3)
CO2: 23 mmol/L (ref 22–32)
CREATININE: 3.88 mg/dL — AB (ref 0.44–1.00)
Chloride: 96 mmol/L — ABNORMAL LOW (ref 101–111)
GFR calc Af Amer: 13 mL/min — ABNORMAL LOW (ref >60)
GFR calc non Af Amer: 11 mL/min — ABNORMAL LOW (ref >60)
GLUCOSE: 123 mg/dL — AB (ref 65–99)
PHOSPHORUS: 4.9 mg/dL — AB (ref 2.5–4.6)
Potassium: 3.9 mmol/L (ref 3.5–5.1)
SODIUM: 131 mmol/L — AB (ref 135–145)

## 2015-12-04 LAB — CBC
HEMATOCRIT: 29.3 % — AB (ref 36.0–46.0)
HEMOGLOBIN: 9.3 g/dL — AB (ref 12.0–15.0)
MCH: 28.7 pg (ref 26.0–34.0)
MCHC: 31.7 g/dL (ref 30.0–36.0)
MCV: 90.4 fL (ref 78.0–100.0)
Platelets: 182 10*3/uL (ref 150–400)
RBC: 3.24 MIL/uL — AB (ref 3.87–5.11)
RDW: 17.6 % — ABNORMAL HIGH (ref 11.5–15.5)
WBC: 7.4 10*3/uL (ref 4.0–10.5)

## 2015-12-04 LAB — SURGICAL PCR SCREEN
MRSA, PCR: NEGATIVE
Staphylococcus aureus: NEGATIVE

## 2015-12-04 LAB — HEPARIN LEVEL (UNFRACTIONATED): Heparin Unfractionated: 0.5 IU/mL (ref 0.30–0.70)

## 2015-12-04 LAB — MAGNESIUM: Magnesium: 2.4 mg/dL (ref 1.7–2.4)

## 2015-12-04 SURGERY — TRANSPOSITION, VEIN, BASILIC
Anesthesia: General | Site: Arm Upper | Laterality: Left

## 2015-12-04 MED ORDER — MIDAZOLAM HCL 5 MG/5ML IJ SOLN
INTRAMUSCULAR | Status: DC | PRN
  Administered 2015-12-04: 2 mg via INTRAVENOUS

## 2015-12-04 MED ORDER — MIDAZOLAM HCL 2 MG/2ML IJ SOLN
INTRAMUSCULAR | Status: AC
  Filled 2015-12-04: qty 2

## 2015-12-04 MED ORDER — ONDANSETRON HCL 4 MG/2ML IJ SOLN
INTRAMUSCULAR | Status: AC
  Filled 2015-12-04: qty 2

## 2015-12-04 MED ORDER — FENTANYL CITRATE (PF) 250 MCG/5ML IJ SOLN
INTRAMUSCULAR | Status: AC
  Filled 2015-12-04: qty 5

## 2015-12-04 MED ORDER — LIDOCAINE 2% (20 MG/ML) 5 ML SYRINGE
INTRAMUSCULAR | Status: AC
  Filled 2015-12-04: qty 5

## 2015-12-04 MED ORDER — PROPOFOL 10 MG/ML IV BOLUS
INTRAVENOUS | Status: DC | PRN
  Administered 2015-12-04: 140 mg via INTRAVENOUS

## 2015-12-04 MED ORDER — EPHEDRINE 5 MG/ML INJ
INTRAVENOUS | Status: AC
  Filled 2015-12-04: qty 10

## 2015-12-04 MED ORDER — HYDROCODONE-ACETAMINOPHEN 7.5-325 MG PO TABS: 1.0000 | ORAL_TABLET | Freq: Once | ORAL | Status: DC | PRN

## 2015-12-04 MED ORDER — SODIUM CHLORIDE 0.9 % IV SOLN
INTRAVENOUS | Status: DC
  Administered 2015-12-04 (×2): via INTRAVENOUS

## 2015-12-04 MED ORDER — LIDOCAINE-EPINEPHRINE 0.5 %-1:200000 IJ SOLN
INTRAMUSCULAR | Status: AC
  Filled 2015-12-04: qty 1

## 2015-12-04 MED ORDER — DARBEPOETIN ALFA 60 MCG/0.3ML IJ SOSY
60.0000 ug | PREFILLED_SYRINGE | INTRAMUSCULAR | Status: DC
  Filled 2015-12-04: qty 0.3

## 2015-12-04 MED ORDER — FENTANYL CITRATE (PF) 250 MCG/5ML IJ SOLN
INTRAMUSCULAR | Status: DC | PRN
  Administered 2015-12-04: 50 ug via INTRAVENOUS
  Administered 2015-12-04: 25 ug via INTRAVENOUS
  Administered 2015-12-04 (×2): 50 ug via INTRAVENOUS

## 2015-12-04 MED ORDER — MORPHINE SULFATE (PF) 2 MG/ML IV SOLN
1.0000 mg | Freq: Once | INTRAVENOUS | Status: AC
  Administered 2015-12-04: 1 mg via INTRAVENOUS
  Filled 2015-12-04: qty 1

## 2015-12-04 MED ORDER — FENTANYL CITRATE (PF) 100 MCG/2ML IJ SOLN: 25.0000 ug | INTRAMUSCULAR | Status: DC | PRN

## 2015-12-04 MED ORDER — SODIUM CHLORIDE 0.9 % IV SOLN
INTRAVENOUS | Status: DC | PRN
  Administered 2015-12-04: 500 mL

## 2015-12-04 MED ORDER — EPHEDRINE SULFATE 50 MG/ML IJ SOLN
INTRAMUSCULAR | Status: DC | PRN
  Administered 2015-12-04: 10 mg via INTRAVENOUS

## 2015-12-04 MED ORDER — WARFARIN - PHARMACIST DOSING INPATIENT
Freq: Every day | Status: DC
  Administered 2015-12-05: 18:00:00

## 2015-12-04 MED ORDER — 0.9 % SODIUM CHLORIDE (POUR BTL) OPTIME
TOPICAL | Status: DC | PRN
  Administered 2015-12-04: 1000 mL

## 2015-12-04 MED ORDER — ONDANSETRON HCL 4 MG/2ML IJ SOLN
INTRAMUSCULAR | Status: DC | PRN
  Administered 2015-12-04: 4 mg via INTRAVENOUS

## 2015-12-04 MED ORDER — LIDOCAINE 2% (20 MG/ML) 5 ML SYRINGE
INTRAMUSCULAR | Status: DC | PRN
  Administered 2015-12-04: 50 mg via INTRAVENOUS

## 2015-12-04 MED ORDER — WARFARIN SODIUM 7.5 MG PO TABS
7.5000 mg | ORAL_TABLET | Freq: Once | ORAL | Status: AC
  Administered 2015-12-04: 7.5 mg via ORAL
  Filled 2015-12-04: qty 1

## 2015-12-04 MED ORDER — GLYCOPYRROLATE 0.2 MG/ML IV SOSY
PREFILLED_SYRINGE | INTRAVENOUS | Status: AC
  Filled 2015-12-04: qty 3

## 2015-12-04 MED ORDER — CHLORHEXIDINE GLUCONATE CLOTH 2 % EX PADS
6.0000 | MEDICATED_PAD | Freq: Every day | CUTANEOUS | Status: DC
  Administered 2015-12-04: 6 via TOPICAL

## 2015-12-04 SURGICAL SUPPLY — 44 items
APL SKNCLS STERI-STRIP NONHPOA (GAUZE/BANDAGES/DRESSINGS) ×1
BENZOIN TINCTURE PRP APPL 2/3 (GAUZE/BANDAGES/DRESSINGS) ×3 IMPLANT
BNDG GAUZE ELAST 4 BULKY (GAUZE/BANDAGES/DRESSINGS) ×2 IMPLANT
CANISTER SUCTION 2500CC (MISCELLANEOUS) ×3 IMPLANT
CANNULA VESSEL 3MM 2 BLNT TIP (CANNULA) ×3 IMPLANT
CLIP LIGATING EXTRA MED SLVR (CLIP) ×3 IMPLANT
CLIP LIGATING EXTRA SM BLUE (MISCELLANEOUS) ×3 IMPLANT
CLOSURE STERI-STRIP 1/2X4 (GAUZE/BANDAGES/DRESSINGS) ×1
CLOSURE WOUND 1/2 X4 (GAUZE/BANDAGES/DRESSINGS) ×1
CLSR STERI-STRIP ANTIMIC 1/2X4 (GAUZE/BANDAGES/DRESSINGS) ×1 IMPLANT
COVER PROBE W GEL 5X96 (DRAPES) ×3 IMPLANT
DECANTER SPIKE VIAL GLASS SM (MISCELLANEOUS) ×1 IMPLANT
ELECT REM PT RETURN 9FT ADLT (ELECTROSURGICAL) ×3
ELECTRODE REM PT RTRN 9FT ADLT (ELECTROSURGICAL) ×1 IMPLANT
GAUZE SPONGE 4X4 12PLY STRL (GAUZE/BANDAGES/DRESSINGS) ×3 IMPLANT
GEL ULTRASOUND 20GR AQUASONIC (MISCELLANEOUS) IMPLANT
GLOVE BIOGEL PI IND STRL 6.5 (GLOVE) IMPLANT
GLOVE BIOGEL PI IND STRL 7.5 (GLOVE) IMPLANT
GLOVE BIOGEL PI INDICATOR 6.5 (GLOVE) ×6
GLOVE BIOGEL PI INDICATOR 7.5 (GLOVE) ×2
GLOVE ECLIPSE 6.5 STRL STRAW (GLOVE) ×2 IMPLANT
GLOVE ECLIPSE 7.0 STRL STRAW (GLOVE) ×2 IMPLANT
GLOVE ECLIPSE 7.5 STRL STRAW (GLOVE) ×2 IMPLANT
GLOVE SS BIOGEL STRL SZ 7.5 (GLOVE) ×1 IMPLANT
GLOVE SUPERSENSE BIOGEL SZ 7.5 (GLOVE) ×2
GOWN STRL REUS W/ TWL LRG LVL3 (GOWN DISPOSABLE) ×3 IMPLANT
GOWN STRL REUS W/TWL LRG LVL3 (GOWN DISPOSABLE) ×12
GOWN STRL REUS W/TWL XL LVL3 (GOWN DISPOSABLE) ×2 IMPLANT
KIT BASIN OR (CUSTOM PROCEDURE TRAY) ×3 IMPLANT
KIT ROOM TURNOVER OR (KITS) ×3 IMPLANT
NS IRRIG 1000ML POUR BTL (IV SOLUTION) ×3 IMPLANT
PACK CV ACCESS (CUSTOM PROCEDURE TRAY) ×3 IMPLANT
PAD ARMBOARD 7.5X6 YLW CONV (MISCELLANEOUS) ×6 IMPLANT
SPONGE GAUZE 4X4 12PLY STER LF (GAUZE/BANDAGES/DRESSINGS) ×2 IMPLANT
STRIP CLOSURE SKIN 1/2X4 (GAUZE/BANDAGES/DRESSINGS) ×2 IMPLANT
SUT PROLENE 6 0 CC (SUTURE) ×5 IMPLANT
SUT SILK 2 0 SH (SUTURE) ×3 IMPLANT
SUT SILK 3 0 (SUTURE) ×3
SUT SILK 3-0 18XBRD TIE 12 (SUTURE) IMPLANT
SUT VIC AB 3-0 SH 27 (SUTURE) ×9
SUT VIC AB 3-0 SH 27X BRD (SUTURE) ×1 IMPLANT
TAPE CLOTH SURG 4X10 WHT LF (GAUZE/BANDAGES/DRESSINGS) ×2 IMPLANT
UNDERPAD 30X30 INCONTINENT (UNDERPADS AND DIAPERS) ×3 IMPLANT
WATER STERILE IRR 1000ML POUR (IV SOLUTION) ×1 IMPLANT

## 2015-12-04 NOTE — Progress Notes (Signed)
Patient with complaints of pain in feet and arms, MD Elmahi notified and order given for morphine 1 mg Neta Mends RN 5:57 PM  12-04-2015

## 2015-12-04 NOTE — Care Management Important Message (Signed)
Important Message  Patient Details  Name: Briana [name] MRN: [id number] Date of Birth: [birth year]   Medicare Important Message Given:  Yes    Loann Quill 12/04/2015, 8:22 AM

## 2015-12-04 NOTE — Op Note (Signed)
    OPERATIVE REPORT  DATE OF SURGERY: 12/04/2015  PATIENT: Briana [name], 71 y.o. female MRN: [id number]  DOB: Mar 29, 1946  PRE-OPERATIVE DIAGNOSIS: End-stage renal disease   POST-OPERATIVE DIAGNOSIS:  Same  PROCEDURE: Left basilic vein transposition  SURGEON:  Curt Jews, M.D.  PHYSICIAN ASSISTANT: Collins  ANESTHESIA:  Gen.  EBL: Minimal ml  Total I/O In: 600 [I.V.:600] Out: 25 [Blood:25]  BLOOD ADMINISTERED: None  DRAINS: None  SPECIMEN: None  COUNTS CORRECT:  YES  PLAN OF CARE: PACU   PATIENT DISPOSITION:  PACU - hemodynamically stable  PROCEDURE DETAILS: Patient was taken to the operating placed supine position where there the left arm left axilla prepped and draped in usual sterile fashion. Incision was made over the antecubital space to expose the brachial artery which was of large caliber and the basilic vein which was also of large caliber. Patient had multiple large tributary branches off the basilic vein at this region these were ligated with 3-0 silk ties and divided. SonoSite ultrasound was used to visualize the course of the basilic vein. Incision was made in the mid upper arm and then at the axilla over the basilic vein. The vein was mobilized through these incisions and was dissected circumferentially. Again tributary branches were ligated with 301 4-0 silk ties and divided. The vein was marked to prevent twisting and was ligated distally with a 2-0 silk ties and divided. The vein was brought out through the tunnel so that it was exiting at the level of the axilla. The vein was gently dilated with heparinized saline was found to be of excellent caliber. A tunnel was created from the level of the antecubital space to the axilla and the vein was brought through the subcutaneous tunnel. Again care was taken to prevent twisting. The brachial artery was occluded proximally and distally and was opened with an 11 blade and extended longitudinally with Potts  scissors. A small arteriotomy was created. The vein was cut to appropriate length and was sewn end-to-side to the artery with a running 6-0 Prolene suture. Clamps were removed and excellent thrill was noted. The wounds were irrigated with saline. Hemostasis tablet cautery. The wounds were closed with 3-0 Vicryl in the subcutaneous and subcuticular tissue. Benzoin and Steri-Strips were applied. The patient was transferred to the recovery room in stable condition   Curt Jews, M.D. 12/04/2015 1:44 PM

## 2015-12-04 NOTE — Anesthesia Procedure Notes (Signed)
Procedure Name: LMA Insertion Date/Time: 12/04/2015 10:21 AM Performed by: Melina Copa, Aurthur Wingerter R Pre-anesthesia Checklist: Patient identified, Emergency Drugs available, Suction available and Patient being monitored Patient Re-evaluated:Patient Re-evaluated prior to inductionOxygen Delivery Method: Circle System Utilized Preoxygenation: Pre-oxygenation with 100% oxygen Intubation Type: IV induction Ventilation: Mask ventilation without difficulty LMA: LMA inserted LMA Size: 4.0 Number of attempts: 1 Placement Confirmation: positive ETCO2 Tube secured with: Tape Dental Injury: Teeth and Oropharynx as per pre-operative assessment

## 2015-12-04 NOTE — Progress Notes (Signed)
ANTICOAGULATION CONSULT NOTE - Initial Consult  Pharmacy Consult for Coumadin Indication: atrial fibrillation  Allergies  Allergen Reactions  . Oxycodone Other (See Comments)    hallucinations  . Clonazepam Other (See Comments)    Unknown reaction  . Gemfibrozil Other (See Comments)    Patient is not aware of this allergy but states that she has side effects to a cholesterol medication in the past  . Carbamazepine Other (See Comments)    Reaction to tegretol - loopy  . Macrobid [Nitrofurantoin] Nausea And Vomiting    Patient Measurements: Height: 5\' 4"  (162.6 cm) Weight: 211 lb 9.6 oz (95.981 kg) (c scale) IBW/kg (Calculated) : 54.7   Vital Signs: Temp: 98.7 F (37.1 C) (07/10 1235) Temp Source: Oral (07/10 0620) BP: 150/42 mmHg (07/10 1250) Pulse Rate: 52 (07/10 1250)  Labs:  Recent Labs  12/02/15 0333 12/02/15 0435 12/02/15 0437 12/03/15 0927 12/04/15 0401  HGB 9.5*  --   --  9.8* 9.3*  HCT 30.5*  --   --  32.5* 29.3*  PLT 217  --   --  182 182  LABPROT 15.5*  --   --  15.4* 14.9  INR 1.21  --   --  1.21 1.16  HEPARINUNFRC  --   --  0.26* 0.36 0.50  CREATININE  --  3.56*  --  3.27* 3.88*    Estimated Creatinine Clearance: 15.2 mL/min (by C-G formula based on Cr of 3.88).   Medical History: Past Medical History  Diagnosis Date  . Diabetes mellitus   . Hypertension   . Hyperlipidemia   . Heart murmur   . Peripheral vascular disease ([place])   . Arthritis   . Depression with anxiety   . Carotid artery occlusion   . Chronic kidney disease   . Neuropathy ([place])   . CAD (coronary artery disease)   . Rheumatic fever   . Sleep apnea   . PAD (peripheral artery disease) ([place])   . Adenomatous colon polyp 09/2007  . Atrial fibrillation with rapid ventricular response ([place]) 10/05/2015     Assessment:  AC/Heme: Warfarin PTA for Hx Afib. CHADS vasc score 5.. HL 0.5 in goal this AM. Hgb 9.3. (heparin off today 0945). IR fistula placement this AM. Resume  Coumadin per Dr. Hartford Poli. INR 1.16. --*PTA: 5mg  daily except 2.5mg  on Tuesday and Thursday (last PTA dose 6/15) INR on admit 2.55  Goal of Therapy:  INR 2-3 Monitor platelets by anticoagulation protocol: Yes   Plan:  Coumadin 7.5mg  po x 1 tonight Daily INR   Briana [name], PharmD, BCPS Clinical Staff Pharmacist Pager [phone]  Briana [name] 12/04/2015,3:13 PM

## 2015-12-04 NOTE — Progress Notes (Signed)
Dr Early in to see the patient states to stop heparin.

## 2015-12-04 NOTE — Progress Notes (Signed)
Patient returned from [place] and her "[employer]" that is her personal aid for sleep was not with her in her bed and was present when she was transported to OR.  [phone] was called and request made to look for patient belonging and send to her room.

## 2015-12-04 NOTE — Anesthesia Preprocedure Evaluation (Addendum)
Anesthesia Evaluation  Patient identified by MRN, date of birth, ID band Patient awake    Reviewed: Allergy & Precautions, NPO status , Patient's Chart, lab work & pertinent test results, reviewed documented beta blocker date and time   History of Anesthesia Complications Negative for: history of anesthetic complications  Airway Mallampati: I  TM Distance: >3 FB Neck ROM: Full    Dental  (+) Edentulous Upper, Edentulous Lower, Dental Advisory Given   Pulmonary neg shortness of breath, sleep apnea , neg COPD,    breath sounds clear to auscultation       Cardiovascular hypertension, Pt. on medications and Pt. on home beta blockers + CAD, + Past MI, + Peripheral Vascular Disease and +CHF   Rhythm:Regular     Neuro/Psych PSYCHIATRIC DISORDERS Depression negative neurological ROS     GI/Hepatic negative GI ROS,   Endo/Other  diabetes, Type 1  Renal/GU ESRF and DialysisRenal disease     Musculoskeletal  (+) Arthritis ,   Abdominal   Peds  Hematology   Anesthesia Other Findings   Reproductive/Obstetrics                            Anesthesia Physical Anesthesia Plan  ASA: III  Anesthesia Plan: General   Post-op Pain Management:    Induction:   Airway Management Planned: LMA  Additional Equipment: None  Intra-op Plan:   Post-operative Plan: Extubation in OR  Informed Consent: I have reviewed the patients History and Physical, chart, labs and discussed the procedure including the risks, benefits and alternatives for the proposed anesthesia with the patient or authorized representative who has indicated his/her understanding and acceptance.   Dental advisory given  Plan Discussed with: CRNA, Anesthesiologist and Surgeon  Anesthesia Plan Comments:        Anesthesia Quick Evaluation

## 2015-12-04 NOTE — Transfer of Care (Signed)
Immediate Anesthesia Transfer of Care Note  Patient: Briana [name]  Procedure(s) Performed: Procedure(s): BASILIC VEIN TRANSPOSITION (Left)  Patient Location: PACU  Anesthesia Type:General  Level of Consciousness: awake, oriented and patient cooperative  Airway & Oxygen Therapy: Patient Spontanous Breathing and Patient connected to nasal cannula oxygen  Post-op Assessment: Report given to RN, Post -op Vital signs reviewed and stable and Patient moving all extremities  Post vital signs: Reviewed and stable  Last Vitals:  Filed Vitals:   12/03/15 1941 12/04/15 0620  BP: 155/60 153/46  Pulse: 52 52  Temp: 36.7 C 36.6 C  Resp: 18 19    Last Pain:  Filed Vitals:   12/04/15 1236  PainSc: 0-No pain      Patients Stated Pain Goal: 2 ([id number])  Complications: No apparent anesthesia complications

## 2015-12-04 NOTE — H&P (View-Only) (Signed)
Patient ID: Briana [name], female   DOB: 21-Jan-1946, 70 y.o.   MRN: [id number] Scheduled for left basilic vein transposition tomorrow. Nothing by mouth after midnight. Again discussed the procedure with the patient who understands plan to do transposition and fistula in the same setting rather than stage. If her vein is marginal will Staged fistula and transposition at a later date

## 2015-12-04 NOTE — Progress Notes (Signed)
Patient ID: Briana [name], female   DOB: [birth year], 70 y.o.   MRN: [id number]  [facility] Progress Note   Assessment/ Plan:   1. Acute exacerbation of diastolic CHF: Improved with ultrafiltration at hemodialysis after failure to respond to diuretic therapy. Now is euvolemic on HD 2. New ESRD: CLIP pending ([facility] to [facility] now?)  On [facility] schedule as inpatient.  s/p conversion of temporary dialysis catheter to tunneled dialysis catheter on 7/5 and plan for BVT 7/10 with VVS. She states that she would like to undergo chronic hemodialysis in [place], [place] -- She tells me unit is full and now might be in [place] which is ok with her. HD tomorrow if here 3. Anemia: Hemoglobin stable-rec intravenous iron to replace iron deficiency and continue ESA.  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 Nutrition: Albumin is low-continue encouraging lean protein intake/oral nutritional supplements 6. Hypertension: Blood pressure is marginally elevated-anticipate will improve with ultrafiltration at hemodialysis- also with low sodium indicates still some volume overload  Subjective:   For AVF today No new events CLIP pending, req MWF chair   Objective:   BP 153/46 mmHg  Pulse 52  Temp(Src) 97.9 F (36.6 C) (Oral)  Resp 19  Ht 5\' 4"  (1.626 m)  Wt 95.981 kg (211 lb 9.6 oz)  BMI 36.30 kg/m2  SpO2 98%  Physical Exam: Gen: Comfortable in bed CVS: Pulse regular bradycardia, S1 and S2 normal Resp: Decreased breath sounds over bases-no distinct rales or rhonchi Abd: Soft, obese, nontender Ext: minimal  lower extremity edema  Labs: BMET  Recent Labs Lab 11/28/15 0428 11/29/15 0312 11/30/15 0655 12/01/15 0418 12/02/15 0435 12/03/15 0927 12/04/15 0401  NA 133* 134* 133* 134* 134* 131* 131*  K 3.5 3.6 3.5 3.7 4.1 4.3 3.9  CL 96* 98* 100* 98* 98* 96* 96*  CO2 27 27 26 26 23 23 23   GLUCOSE 126* 103* 166* 131* 101*  250* 123*  BUN 52* 26* 34* 22* 43* 30* 47*  CREATININE 2.81* 2.25* 2.74* 3.04* 3.56* 3.27* 3.88*  CALCIUM 8.0* 8.1* 8.2* 8.3* 8.6* 8.6* 8.7*  PHOS 3.9 3.1 3.9 3.9 4.3 3.7 4.9*   CBC  Recent Labs Lab 12/01/15 0418 12/02/15 0333 12/03/15 0927 12/04/15 0401  WBC 8.9 8.2 6.6 7.4  HGB 10.2* 9.5* 9.8* 9.3*  HCT 33.2* 30.5* 32.5* 29.3*  MCV 91.7 91.0 93.1 90.4  PLT 246 217 182 182   Medications:    . amiodarone  200 mg Oral Daily  . amLODipine  5 mg Oral QHS  . atorvastatin  80 mg Oral Daily  . bisacodyl  10 mg Rectal Once  .  ceFAZolin (ANCEF) IV  2 g Intravenous To SS-Surg  . cefUROXime (ZINACEF)  IV  1.5 g Intravenous On Call to OR  . Chlorhexidine Gluconate Cloth  6 each Topical Q0600  . darbepoetin (ARANESP) injection - DIALYSIS  60 mcg Intravenous Q Mon-HD  . docusate sodium  100 mg Oral BID  . ferric gluconate (FERRLECIT/NULECIT) IV  125 mg Intravenous Daily  . insulin aspart  0-15 Units Subcutaneous TID WC  . insulin aspart  0-5 Units Subcutaneous QHS  . insulin aspart protamine- aspart  15 Units Subcutaneous BID WC  . loratadine  10 mg Oral Daily  . multivitamin  1 tablet Oral QHS  . polyethylene glycol  17 g Oral BID  . protein supplement  1 scoop Oral TID WC  . sevelamer carbonate  800 mg Oral TID WC  . sodium chloride flush  10-40 mL Intracatheter Q12H  . sodium chloride flush  10-40 mL Intracatheter Q12H  . sodium chloride flush  3 mL Intravenous Q12H   Tatem Holsonback B   12/04/2015, 9:02 AM

## 2015-12-04 NOTE — Interval H&P Note (Signed)
History and Physical Interval Note:  12/04/2015 9:41 AM  Briana [name]  has presented today for surgery, with the diagnosis of Stage V Chronic Kidney Disease N18.5  The various methods of treatment have been discussed with the patient and family. After consideration of risks, benefits and other options for treatment, the patient has consented to  Procedure(s): BASILIC VEIN TRANSPOSITION (Left) as a surgical intervention .  The patient's history has been reviewed, patient examined, no change in status, stable for surgery.  I have reviewed the patient's chart and labs.  Questions were answered to the patient's satisfaction.     Curt Jews

## 2015-12-05 ENCOUNTER — Other Ambulatory Visit: Payer: Self-pay | Admitting: *Deleted

## 2015-12-05 ENCOUNTER — Encounter (HOSPITAL_COMMUNITY): Payer: Self-pay | Admitting: Vascular Surgery

## 2015-12-05 DIAGNOSIS — N186 End stage renal disease: Secondary | ICD-10-CM

## 2015-12-05 DIAGNOSIS — Z4931 Encounter for adequacy testing for hemodialysis: Secondary | ICD-10-CM

## 2015-12-05 LAB — RENAL FUNCTION PANEL
ALBUMIN: 3 g/dL — AB (ref 3.5–5.0)
Anion gap: 10 (ref 5–15)
BUN: 51 mg/dL — AB (ref 6–20)
CALCIUM: 8.5 mg/dL — AB (ref 8.9–10.3)
CO2: 25 mmol/L (ref 22–32)
CREATININE: 4.11 mg/dL — AB (ref 0.44–1.00)
Chloride: 95 mmol/L — ABNORMAL LOW (ref 101–111)
GFR calc Af Amer: 12 mL/min — ABNORMAL LOW (ref >60)
GFR calc non Af Amer: 10 mL/min — ABNORMAL LOW (ref >60)
GLUCOSE: 88 mg/dL (ref 65–99)
PHOSPHORUS: 6.1 mg/dL — AB (ref 2.5–4.6)
POTASSIUM: 4.6 mmol/L (ref 3.5–5.1)
SODIUM: 130 mmol/L — AB (ref 135–145)

## 2015-12-05 LAB — GLUCOSE, CAPILLARY
GLUCOSE-CAPILLARY: 117 mg/dL — AB (ref 65–99)
GLUCOSE-CAPILLARY: 168 mg/dL — AB (ref 65–99)
GLUCOSE-CAPILLARY: 205 mg/dL — AB (ref 65–99)
Glucose-Capillary: 90 mg/dL (ref 65–99)
Glucose-Capillary: 111 mg/dL — ABNORMAL HIGH (ref 65–99)

## 2015-12-05 LAB — PROTIME-INR
INR: 1.19 (ref 0.00–1.49)
PROTHROMBIN TIME: 15.2 s (ref 11.6–15.2)

## 2015-12-05 LAB — MAGNESIUM: Magnesium: 2.5 mg/dL — ABNORMAL HIGH (ref 1.7–2.4)

## 2015-12-05 MED ORDER — HEPARIN (PORCINE) IN NACL 100-0.45 UNIT/ML-% IJ SOLN
1700.0000 [IU]/h | INTRAMUSCULAR | Status: DC
  Administered 2015-12-05: 1200 [IU]/h via INTRAVENOUS
  Administered 2015-12-06: 1300 [IU]/h via INTRAVENOUS
  Administered 2015-12-07: 1400 [IU]/h via INTRAVENOUS
  Administered 2015-12-08 – 2015-12-09 (×2): 1700 [IU]/h via INTRAVENOUS
  Filled 2015-12-05 (×7): qty 250

## 2015-12-05 MED ORDER — WARFARIN SODIUM 7.5 MG PO TABS
7.5000 mg | ORAL_TABLET | Freq: Once | ORAL | Status: AC
Start: 2015-12-05 — End: 2015-12-05
  Administered 2015-12-05: 7.5 mg via ORAL
  Filled 2015-12-05: qty 1

## 2015-12-05 MED ORDER — DARBEPOETIN ALFA 60 MCG/0.3ML IJ SOSY: 60.0000 ug | PREFILLED_SYRINGE | INTRAMUSCULAR | Status: DC

## 2015-12-05 NOTE — Progress Notes (Signed)
Patient ID: Briana [name], female   DOB: 04/09/1946, 70 y.o.   MRN: [id number] Patient currently on hemodialysis via catheter. Report some soreness in her left arm. Moderate bruising as expected. Excellent thrill in her basilic vein transposition. Easily palpable left radial pulse and no symptoms of steal. Stable overall. We'll see her as an outpatient in one month for follow-up. Please call if we can assist while an inpatient.

## 2015-12-05 NOTE — Progress Notes (Signed)
Pt. Unable to stand for weight this am.

## 2015-12-05 NOTE — Progress Notes (Addendum)
  Postoperative hemodialysis access     Date of Surgery:  12/04/15 Surgeon: Early  Subjective:  C/o itching  PHYSICAL EXAMINATION:  Filed Vitals:   12/05/15 0730 12/05/15 0800  BP: 108/47 133/56  Pulse: 53 55  Temp:    Resp: 22 16    Incisions are all clean and dry with steri strips in tact; there is some ecchymosis around the distal incisions Sensation in digits is intact;  There is  Thrill  There is bruit. The graft/fistula is palpable    ASSESSMENT/PLAN:  Briana [name] is a 70 y.o. year old female who is s/p left BVT.  -graft/fistula is patent -pt does not have evidence of steal sx as she does not have any pain in her hand and left radial pulse is palpable -f/u with Dr. Donnetta Hutching in 4-6 weeks to check maturation of AVF -coumadin restarted last pm. -will sign off-call as needed.   Leontine Locket, PA-C [facility] [phone]

## 2015-12-05 NOTE — Progress Notes (Signed)
PROGRESS NOTE  Briana [name] [id number] DOB: 06-30-1945 DOA: 11/10/2015 PCP: Antionette Fairy, PA-C  Late entry for the note  LOS: 25 days    Subjective: Denies any complaints this morning, restart heparin drip.  Brief Narrative: 70 y.o. female with past medical history significant for chronic diastolic CHF (in [id number] [id number] with EF 55%), hypertension, dyslipidemia, atrial fibrillation on anticoagulation with Coumadin, recently hospitalized 5/6-5/24 of 2017 for pneumonia which was then complicated further by A.Fib RVR which was unstable and required cardioversion. She also had NSTEMI which was managed medically and no cardiac cath due to acute kidney failure. She was discharged to inpatient rehab.   Patient presented to [facility] with worsening shortness of breath over past 2 weeks prior to this admission, or extremity edema. She was seen by primary care physician the week prior to the admission and was instructed to take Lasix twice daily whereas before she was on 40 mg once a day Lasix. There was no significant symptomatic relief for which reason she presented to ED for further evaluation.   Hospital course complicated with worsening renal function and fluid overload for which nephrology was consulted. Patient was eventually moved to ICU and was on CRRT between 6/28 and 6/30 with significant improvement in her fluid status. Transferred to telemetry 6/30. Did not respond to diuresis and was becoming more fluid overload and was started on regular HD 7/3. Tunneled cath laced on 7/5.   Assessment & Plan: Principal Problem:   Acute on chronic diastolic (congestive) heart failure (HCC) Active Problems:   Acute on chronic diastolic CHF (congestive heart failure) (HCC)   Controlled diabetes mellitus type 2 with complications (HCC)   CAD in native artery   Chronic kidney disease (CKD), stage IV (severe) (HCC)   Essential hypertension   Anemia of chronic kidney failure   Acute on  chronic diastolic CHF  - patient did not respond well initially to diuresis requiring CRRT for 72 h 6/28 - 6/30.  - Weight since 6/23: 109.7 kg --> 109.2 kg --> 108.8 kg --> 109.3 kg --> 108.1 kg >> 104.4 >> 97 kg >> 91 /// CRRT stopped > weight 94 kg >> 96 >> 96.5 until 7/3 when HD started per nephrology. - Did not respond to po Lasix with minimal UOP on 7/1 after CRRT, changed to IV Lasix on 7/2, with that she had suboptimal UOP and was started on HD 7/3 -Cardiology signed off, continue BiDil.  CKD stage 5, now ESRD. - Creatinine 3 weeks ago 2.75 - Renal following - BUN / Cr / weight continues to climb despite IV Lasix Lasix, had CRRT, did not respond to diuretics afterwards and had her first hemodialysis on 7/3 using temporary IJ catheter. She will undergo again hemodialysis on 7/4.  -Nephrology recommend to continue hemodialysis, HD tunneled catheter placed on 11/29/15 by Dr. Earleen Newport. -Vascular surgery consulted, Fistula creation scheduled initially on 7/7 but postponed to Monday 7/10. -I tried to talk to the vascular surgery PA, I was informed by the nurse in the OR that there is no provider available to do the procedure today or over the weekend, the procedure was postponed due to unforeseeable circumstances. -Cannot discharge patient because she is on heparin drip, will keep on heparin drip until Monday and start Coumadin after surgery. -Left basilic vein transposition done earlier Dr. Donnetta Hutching. -Resumed the anticoagulation with heparin bridge and Coumadin.  Hyponatremia - 129>>132 >> 133, improved with HD  Anemia of chronic kidney disease stage 4 -  Hemoglobin 7.6 >> 8.3 >> 8.8. Overall stable  Atrial fibrillation  - CHADS vasc score 5 - Recent TEE 09/2015 done for cardioversion - Continue amiodarone 200 mg daily and metoprolol 25 mg twice daily.  - Coumadin on hold pending tunneled cath, dosing per pharmacy. Now on heparin gtt given INR < 2 - We'll resume heparin and Coumadin, patient  will be appropriate for discharge when INR >2.  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 - Continue 70/30 mix 15 units twice daily - Continue sliding-scale insulin - CBGs controlled 126-139  Dyslipidemia associated with type 2 diabetes mellitus - Continue Lipitor 80 mg at bedtime - Continue daily aspirin   Essential hypertension - Continue Norvasc 10 mg daily, Bidil TID - hold metoprolol for now due to bradycardia   DVT prophylaxis: Coumadin Code Status: Full Family Communication: no family bedside Disposition Plan: Home after fistula creation and INR gets therapeutic  Consultants:   Nephrology  Cardiology - s/o 6/30  Procedures:   CRRT  HD  Antimicrobials:  None     Objective: Filed Vitals:   12/05/15 1030 12/05/15 1100 12/05/15 1110 12/05/15 1154  BP: 119/47 109/48 123/53 121/33  Pulse: 56 55 55 56  Temp:   98 F (36.7 C) 98 F (36.7 C)  TempSrc:   Oral Oral  Resp: 16 18 18 18   Height:      Weight:   94.4 kg (208 lb 1.8 oz)   SpO2:    98%    Intake/Output Summary (Last 24 hours) at 12/05/15 1556 Last data filed at 12/05/15 1345  Gross per 24 hour  Intake    480 ml  Output   3001 ml  Net  -2521 ml   Filed Weights   12/05/15 0432 12/05/15 0710 12/05/15 1110  Weight: 97.251 kg (214 lb 6.4 oz) 97.4 kg (214 lb 11.7 oz) 94.4 kg (208 lb 1.8 oz)    Examination: Constitutional: NAD Filed Vitals:   12/05/15 1030 12/05/15 1100 12/05/15 1110 12/05/15 1154  BP: 119/47 109/48 123/53 121/33  Pulse: 56 55 55 56  Temp:   98 F (36.7 C) 98 F (36.7 C)  TempSrc:   Oral Oral  Resp: 16 18 18 18   Height:      Weight:   94.4 kg (208 lb 1.8 oz)   SpO2:    98%   Eyes: PERRL, lids and conjunctivae normal Respiratory: clear to auscultation bilaterally, no wheezing, no crackles. Normal respiratory effort. No accessory muscle use.  Cardiovascular: Regular rate and rhythm, no murmurs / rubs / gallops. Trace LE edema. Abdomen: no  tenderness. Bowel sounds positive.  Musculoskeletal: no clubbing / cyanosis.  Neurologic: non focal   Data Reviewed: I have personally reviewed following labs and imaging studies  CBC:  Recent Labs Lab 11/30/15 0655 12/01/15 0418 12/02/15 0333 12/03/15 0927 12/04/15 0401  WBC 8.1 8.9 8.2 6.6 7.4  HGB 9.1* 10.2* 9.5* 9.8* 9.3*  HCT 29.1* 33.2* 30.5* 32.5* 29.3*  MCV 90.4 91.7 91.0 93.1 90.4  PLT 240 246 217 182 [id number]   Basic Metabolic Panel:  Recent Labs Lab 12/01/15 0418 12/02/15 0333 12/02/15 0435 12/03/15 0927 12/04/15 0401 12/05/15 0314  NA 134*  --  134* 131* 131* 130*  K 3.7  --  4.1 4.3 3.9 4.6  CL 98*  --  98* 96* 96* 95*  CO2 26  --  23 23 23 25   GLUCOSE 131*  --  101* 250* 123* 88  BUN 22*  --  43* 30* 47* 51*  CREATININE 3.04*  --  3.56* 3.27* 3.88* 4.11*  CALCIUM 8.3*  --  8.6* 8.6* 8.7* 8.5*  MG 2.2 2.4  --  2.2 2.4 2.5*  PHOS 3.9  --  4.3 3.7 4.9* 6.1*   GFR: Estimated Creatinine Clearance: 14.2 mL/min (by C-G formula based on Cr of 4.11). Liver Function Tests:  Recent Labs Lab 12/01/15 0418 12/02/15 0435 12/03/15 0927 12/04/15 0401 12/05/15 0314  ALBUMIN 3.5 3.3* 3.2* 3.1* 3.0*   Coagulation Profile:  Recent Labs Lab 12/01/15 0418 12/02/15 0333 12/03/15 0927 12/04/15 0401 12/05/15 0314  INR 1.26 1.21 1.21 1.16 1.19   CBG:  Recent Labs Lab 12/04/15 1604 12/04/15 2123 12/05/15 0552 12/05/15 1147 12/05/15 1257  GLUCAP 175* 146* 90 117* 168*   Anemia Panel: No results for input(s): VITAMINB12, FOLATE, FERRITIN, TIBC, IRON, RETICCTPCT in the last 72 hours. Urine analysis:    Component Value Date/Time   COLORURINE YELLOW 11/10/2015 1521   APPEARANCEUR CLEAR 11/10/2015 1521   LABSPEC 1.013 11/10/2015 1521   PHURINE 5.5 11/10/2015 1521   GLUCOSEU NEGATIVE 11/10/2015 1521   HGBUR NEGATIVE 11/10/2015 1521   BILIRUBINUR NEGATIVE 11/10/2015 1521   KETONESUR NEGATIVE 11/10/2015 1521   PROTEINUR 100* 11/10/2015 1521    UROBILINOGEN 0.2 03/02/2014 1508   NITRITE NEGATIVE 11/10/2015 1521   LEUKOCYTESUR TRACE* 11/10/2015 1521   Sepsis Labs: Invalid input(s): PROCALCITONIN, LACTICIDVEN  Recent Results (from the past 240 hour(s))  Surgical pcr screen     Status: None   Collection Time: 12/04/15  7:08 AM  Result Value Ref Range Status   MRSA, PCR NEGATIVE NEGATIVE Final   Staphylococcus aureus NEGATIVE NEGATIVE Final    Comment:        The Xpert SA Assay (FDA approved for NASAL specimens in patients over 54 years of age), is one component of a comprehensive surveillance program.  Test performance has been validated by [facility] for patients greater than or equal to 10 year old. It is not intended to diagnose infection nor to guide or monitor treatment.     Radiology Studies: No results found. Scheduled Meds: . amiodarone  200 mg Oral Daily  . amLODipine  5 mg Oral QHS  . atorvastatin  80 mg Oral Daily  . bisacodyl  10 mg Rectal Once  . [START ON 12/07/2015] darbepoetin (ARANESP) injection - DIALYSIS  60 mcg Intravenous Q Thu-HD  . docusate sodium  100 mg Oral BID  . insulin aspart  0-15 Units Subcutaneous TID WC  . insulin aspart  0-5 Units Subcutaneous QHS  . insulin aspart protamine- aspart  15 Units Subcutaneous BID WC  . loratadine  10 mg Oral Daily  . multivitamin  1 tablet Oral QHS  . polyethylene glycol  17 g Oral BID  . protein supplement  1 scoop Oral TID WC  . sevelamer carbonate  800 mg Oral TID WC  . sodium chloride flush  10-40 mL Intracatheter Q12H  . sodium chloride flush  10-40 mL Intracatheter Q12H  . sodium chloride flush  3 mL Intravenous Q12H  . warfarin  7.5 mg Oral ONCE-1800  . Warfarin - Pharmacist Dosing Inpatient   Does not apply q1800   Continuous Infusions: . sodium chloride 10 mL/hr at 12/04/15 [place] Pager: [phone] 12/05/2015, 3:56 PM   If 7PM-7AM, please contact night-coverage [email or web address] Password [facility] 12/05/2015, 3:56 PM

## 2015-12-05 NOTE — Procedures (Signed)
I was present at this dialysis session. I have reviewed the session itself and made appropriate changes.   Doing well.  S/p AVF yesterday.  UF goal 3L, some lability to weights.  3K bath.  Awaiting CLIP info in [place] Weights   12/04/15 0620 12/05/15 0432 12/05/15 0710  Weight: 95.981 kg (211 lb 9.6 oz) 97.251 kg (214 lb 6.4 oz) 97.4 kg (214 lb 11.7 oz)     Recent Labs Lab 12/05/15 0314  NA 130*  K 4.6  CL 95*  CO2 25  GLUCOSE 88  BUN 51*  CREATININE 4.11*  CALCIUM 8.5*  PHOS 6.1*     Recent Labs Lab 12/02/15 0333 12/03/15 0927 12/04/15 0401  WBC 8.2 6.6 7.4  HGB 9.5* 9.8* 9.3*  HCT 30.5* 32.5* 29.3*  MCV 91.0 93.1 90.4  PLT 217 182 182    Scheduled Meds: . amiodarone  200 mg Oral Daily  . amLODipine  5 mg Oral QHS  . atorvastatin  80 mg Oral Daily  . bisacodyl  10 mg Rectal Once  . darbepoetin (ARANESP) injection - DIALYSIS  60 mcg Intravenous Q Tue-HD  . docusate sodium  100 mg Oral BID  . ferric gluconate (FERRLECIT/NULECIT) IV  125 mg Intravenous Daily  . insulin aspart  0-15 Units Subcutaneous TID WC  . insulin aspart  0-5 Units Subcutaneous QHS  . insulin aspart protamine- aspart  15 Units Subcutaneous BID WC  . loratadine  10 mg Oral Daily  . multivitamin  1 tablet Oral QHS  . polyethylene glycol  17 g Oral BID  . protein supplement  1 scoop Oral TID WC  . sevelamer carbonate  800 mg Oral TID WC  . sodium chloride flush  10-40 mL Intracatheter Q12H  . sodium chloride flush  10-40 mL Intracatheter Q12H  . sodium chloride flush  3 mL Intravenous Q12H  . Warfarin - Pharmacist Dosing Inpatient   Does not apply q1800   Continuous Infusions: . sodium chloride 10 mL/hr at 12/04/15 0944   PRN Meds:.sodium chloride, acetaminophen, diazepam, heparin, HYDROcodone-acetaminophen, hydrocortisone, ondansetron (ZOFRAN) IV, sodium chloride flush, sodium chloride flush, sodium chloride flush   Pearson Grippe  MD 12/05/2015, 8:24 AM

## 2015-12-05 NOTE — Anesthesia Postprocedure Evaluation (Signed)
Anesthesia Post Note  Patient: Briana [name]  Procedure(s) Performed: Procedure(s) (LRB): BASILIC VEIN TRANSPOSITION (Left)  Patient location during evaluation: PACU Anesthesia Type: General Level of consciousness: awake Pain management: pain level controlled Respiratory status: spontaneous breathing Cardiovascular status: stable Postop Assessment: no signs of nausea or vomiting Anesthetic complications: no    Last Vitals:  Filed Vitals:   12/05/15 0715 12/05/15 0730  BP: 129/43 108/47  Pulse: 47 53  Temp:    Resp: 13 22    Last Pain:  Filed Vitals:   12/05/15 0741  PainSc: 5                  Claudette Wermuth

## 2015-12-05 NOTE — Progress Notes (Signed)
ANTICOAGULATION CONSULT NOTE - Follow Up Consult  Pharmacy Consult for Coumadin Indication: atrial fibrillation  Allergies  Allergen Reactions  . Oxycodone Other (See Comments)    hallucinations  . Clonazepam Other (See Comments)    Unknown reaction  . Gemfibrozil Other (See Comments)    Patient is not aware of this allergy but states that she has side effects to a cholesterol medication in the past  . Carbamazepine Other (See Comments)    Reaction to tegretol - loopy  . Macrobid [Nitrofurantoin] Nausea And Vomiting    Patient Measurements: Height: 5\' 4"  (162.6 cm) Weight: 208 lb 1.8 oz (94.4 kg) IBW/kg (Calculated) : 54.7  Vital Signs: Temp: 98 F (36.7 C) (07/11 1154) Temp Source: Oral (07/11 1154) BP: 121/33 mmHg (07/11 1154) Pulse Rate: 56 (07/11 1154)  Labs:  Recent Labs  12/03/15 0927 12/04/15 0401 12/05/15 0314  HGB 9.8* 9.3*  --   HCT 32.5* 29.3*  --   PLT 182 182  --   LABPROT 15.4* 14.9 15.2  INR 1.21 1.16 1.19  HEPARINUNFRC 0.36 0.50  --   CREATININE 3.27* 3.88* 4.11*    Estimated Creatinine Clearance: 14.2 mL/min (by C-G formula based on Cr of 4.11).  Assessment:  AC/Heme: Warfarin PTA for Hx Afib. CHADS vasc score 5. Heparin gtt started 7/4 when INR <2 for IR placement of tunneled cath 7/5. IR fistula placement 7/10. Resumed Coumadin 7/10. INR 1.19 unchanged overnight. --*PTA: 5mg  daily except 2.5mg  on Tuesday and Thursday (last PTA dose 6/15) INR on admit 2.55   Goal of Therapy:  INR 2-3 Monitor platelets by anticoagulation protocol: Yes   Plan:  Repeat Coumadin 7.5mg  po x 1 tonight. Daily INR.   Veldon Wager S. Alford Highland, PharmD, BCPS Clinical Staff Pharmacist Pager [phone]  [place], [place] 12/05/2015,1:13 PM

## 2015-12-05 NOTE — Progress Notes (Signed)
ANTICOAGULATION CONSULT NOTE - Follow Up Consult  Pharmacy Consult for Coumadin / Heparin Indication: atrial fibrillation  Allergies  Allergen Reactions  . Oxycodone Other (See Comments)    hallucinations  . Clonazepam Other (See Comments)    Unknown reaction  . Gemfibrozil Other (See Comments)    Patient is not aware of this allergy but states that she has side effects to a cholesterol medication in the past  . Carbamazepine Other (See Comments)    Reaction to tegretol - loopy  . Macrobid [Nitrofurantoin] Nausea And Vomiting    Patient Measurements: Height: 5\' 4"  (162.6 cm) Weight: 208 lb 1.8 oz (94.4 kg) IBW/kg (Calculated) : 54.7 Heparin Dosing Weight: 76.4 kg  Vital Signs: Temp: 98 F (36.7 C) (07/11 1154) Temp Source: Oral (07/11 1154) BP: 121/33 mmHg (07/11 1154) Pulse Rate: 56 (07/11 1154)  Labs:  Recent Labs  12/03/15 0927 12/04/15 0401 12/05/15 0314  HGB 9.8* 9.3*  --   HCT 32.5* 29.3*  --   PLT 182 182  --   LABPROT 15.4* 14.9 15.2  INR 1.21 1.16 1.19  HEPARINUNFRC 0.36 0.50  --   CREATININE 3.27* 3.88* 4.11*    Estimated Creatinine Clearance: 14.2 mL/min (by C-G formula based on Cr of 4.11).   Assessment: 70 yo F on warfarin 5mg  daily except 2.5mg  on Tues/Thurs PTA for Hx Afib. INR on admit was 2.55. CHADS vasc score 5. Heparin gtt started 7/4 when INR <2 for IR placement of tunneled cath 7/5. IR fistula placement 7/10. Resumed Coumadin 7/10. INR 1.19 unchanged overnight. Pharmacy consulted to bridge with heparin back to Coumadin. Heparin was previously therapeutic at 1,200 units/hr.  Goal of Therapy:  INR 2-3 Heparin level 0.3-0.7 units/ml Monitor platelets by anticoagulation protocol: Yes   Plan:  No heparin bolus Restart heparin gtt at 1,200 units/hr Give coumadin 7.5mg  PO x 1  Check 8 hr HL Monitor daily HL / INR, CBC, s/s of bleed  Elenor Quinones, PharmD, BCPS Clinical Pharmacist Pager [phone] 12/05/2015 4:03 PM

## 2015-12-05 NOTE — Progress Notes (Addendum)
PROGRESS NOTE  Briana [name] [id number] DOB: [birth year] DOA: 11/10/2015 PCP: Antionette Fairy, PA-C  Late entry for the note  LOS: 25 days    Subjective: Patient seen after her procedure with her daughter at bedside. Complaining about some pain, 1 dose of morphine given.  Brief Narrative: 70 y.o. female with past medical history significant for chronic diastolic CHF (in [id number] [id number] with EF 55%), hypertension, dyslipidemia, atrial fibrillation on anticoagulation with Coumadin, recently hospitalized 5/6-5/24 of 2017 for pneumonia which was then complicated further by A.Fib RVR which was unstable and required cardioversion. She also had NSTEMI which was managed medically and no cardiac cath due to acute kidney failure. She was discharged to inpatient rehab.   Patient presented to [facility] with worsening shortness of breath over past 2 weeks prior to this admission, or extremity edema. She was seen by primary care physician the week prior to the admission and was instructed to take Lasix twice daily whereas before she was on 40 mg once a day Lasix. There was no significant symptomatic relief for which reason she presented to ED for further evaluation.   Hospital course complicated with worsening renal function and fluid overload for which nephrology was consulted. Patient was eventually moved to ICU and was on CRRT between 6/28 and 6/30 with significant improvement in her fluid status. Transferred to telemetry 6/30. Did not respond to diuresis and was becoming more fluid overload and was started on regular HD 7/3. Tunneled cath laced on 7/5.   Assessment & Plan: Principal Problem:   Acute on chronic diastolic (congestive) heart failure (HCC) Active Problems:   Acute on chronic diastolic CHF (congestive heart failure) (HCC)   Controlled diabetes mellitus type 2 with complications (HCC)   CAD in native artery   Chronic kidney disease (CKD), stage IV (severe) (HCC)   Essential  hypertension   Anemia of chronic kidney failure   Acute on chronic diastolic CHF  - patient did not respond well initially to diuresis requiring CRRT for 72 h 6/28 - 6/30.  - Weight since 6/23: 109.7 kg --> 109.2 kg --> 108.8 kg --> 109.3 kg --> 108.1 kg >> 104.4 >> 97 kg >> 91 /// CRRT stopped > weight 94 kg >> 96 >> 96.5 until 7/3 when HD started per nephrology. - Did not respond to po Lasix with minimal UOP on 7/1 after CRRT, changed to IV Lasix on 7/2, with that she had suboptimal UOP and was started on HD 7/3 -Cardiology signed off, continue BiDil.  CKD stage 5, now ESRD. - Creatinine 3 weeks ago 2.75 - Renal following - BUN / Cr / weight continues to climb despite IV Lasix Lasix, had CRRT, did not respond to diuretics afterwards and had her first hemodialysis on 7/3 using temporary IJ catheter. She will undergo again hemodialysis on 7/4.  -Nephrology recommend to continue hemodialysis, HD tunneled catheter placed on 11/29/15 by Dr. Earleen Newport. -Vascular surgery consulted, Fistula creation scheduled initially on 7/7 but postponed to Monday 7/10. -I tried to talk to the vascular surgery PA, I was informed by the nurse in the OR that there is no provider available to do the procedure today or over the weekend, the procedure was postponed due to unforeseeable circumstances. -Cannot discharge patient because she is on heparin drip, will keep on heparin drip until Monday and start Coumadin after surgery. -Left basilic vein transposition done earlier Dr. Donnetta Hutching. Burnis Medin resume the anticoagulation with heparin bridge and Coumadin.  Hyponatremia - 129>>132 >>  133, improved with HD  Anemia of chronic kidney disease stage 4 - Hemoglobin 7.6 >> 8.3 >> 8.8. Overall stable  Atrial fibrillation  - CHADS vasc score 5 - Recent TEE 09/2015 done for cardioversion - Continue amiodarone 200 mg daily and metoprolol 25 mg twice daily.  - Coumadin on hold pending tunneled cath, dosing per pharmacy. Now on  heparin gtt given INR < 2 - We'll resume heparin and Coumadin, patient will be appropriate for discharge when INR >2.  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 - Continue 70/30 mix 15 units twice daily - Continue sliding-scale insulin - CBGs controlled 126-139  Dyslipidemia associated with type 2 diabetes mellitus - Continue Lipitor 80 mg at bedtime - Continue daily aspirin   Essential hypertension - Continue Norvasc 10 mg daily, Bidil TID - hold metoprolol for now due to bradycardia   DVT prophylaxis: Coumadin Code Status: Full Family Communication: no family bedside Disposition Plan: Home after fistula creation and INR gets therapeutic  Consultants:   Nephrology  Cardiology - s/o 6/30  Procedures:   CRRT  HD  Antimicrobials:  None     Objective: Filed Vitals:   12/05/15 1030 12/05/15 1100 12/05/15 1110 12/05/15 1154  BP: 119/47 109/48 123/53 121/33  Pulse: 56 55 55 56  Temp:   98 F (36.7 C) 98 F (36.7 C)  TempSrc:   Oral Oral  Resp: 16 18 18 18   Height:      Weight:   94.4 kg (208 lb 1.8 oz)   SpO2:    98%    Intake/Output Summary (Last 24 hours) at 12/05/15 1553 Last data filed at 12/05/15 1345  Gross per 24 hour  Intake    480 ml  Output   3001 ml  Net  -2521 ml   Filed Weights   12/05/15 0432 12/05/15 0710 12/05/15 1110  Weight: 97.251 kg (214 lb 6.4 oz) 97.4 kg (214 lb 11.7 oz) 94.4 kg (208 lb 1.8 oz)    Examination: Constitutional: NAD Filed Vitals:   12/05/15 1030 12/05/15 1100 12/05/15 1110 12/05/15 1154  BP: 119/47 109/48 123/53 121/33  Pulse: 56 55 55 56  Temp:   98 F (36.7 C) 98 F (36.7 C)  TempSrc:   Oral Oral  Resp: 16 18 18 18   Height:      Weight:   94.4 kg (208 lb 1.8 oz)   SpO2:    98%   Eyes: PERRL, lids and conjunctivae normal Respiratory: clear to auscultation bilaterally, no wheezing, no crackles. Normal respiratory effort. No accessory muscle use.  Cardiovascular: Regular rate  and rhythm, no murmurs / rubs / gallops. Trace LE edema. Abdomen: no tenderness. Bowel sounds positive.  Musculoskeletal: no clubbing / cyanosis.  Neurologic: non focal   Data Reviewed: I have personally reviewed following labs and imaging studies  CBC:  Recent Labs Lab 11/30/15 0655 12/01/15 0418 12/02/15 0333 12/03/15 0927 12/04/15 0401  WBC 8.1 8.9 8.2 6.6 7.4  HGB 9.1* 10.2* 9.5* 9.8* 9.3*  HCT 29.1* 33.2* 30.5* 32.5* 29.3*  MCV 90.4 91.7 91.0 93.1 90.4  PLT 240 246 217 182 [id number]   Basic Metabolic Panel:  Recent Labs Lab 12/01/15 0418 12/02/15 0333 12/02/15 0435 12/03/15 0927 12/04/15 0401 12/05/15 0314  NA 134*  --  134* 131* 131* 130*  K 3.7  --  4.1 4.3 3.9 4.6  CL 98*  --  98* 96* 96* 95*  CO2 26  --  23 23 23  25  GLUCOSE 131*  --  101* 250* 123* 88  BUN 22*  --  43* 30* 47* 51*  CREATININE 3.04*  --  3.56* 3.27* 3.88* 4.11*  CALCIUM 8.3*  --  8.6* 8.6* 8.7* 8.5*  MG 2.2 2.4  --  2.2 2.4 2.5*  PHOS 3.9  --  4.3 3.7 4.9* 6.1*   GFR: Estimated Creatinine Clearance: 14.2 mL/min (by C-G formula based on Cr of 4.11). Liver Function Tests:  Recent Labs Lab 12/01/15 0418 12/02/15 0435 12/03/15 0927 12/04/15 0401 12/05/15 0314  ALBUMIN 3.5 3.3* 3.2* 3.1* 3.0*   Coagulation Profile:  Recent Labs Lab 12/01/15 0418 12/02/15 0333 12/03/15 0927 12/04/15 0401 12/05/15 0314  INR 1.26 1.21 1.21 1.16 1.19   CBG:  Recent Labs Lab 12/04/15 1604 12/04/15 2123 12/05/15 0552 12/05/15 1147 12/05/15 1257  GLUCAP 175* 146* 90 117* 168*   Anemia Panel: No results for input(s): VITAMINB12, FOLATE, FERRITIN, TIBC, IRON, RETICCTPCT in the last 72 hours. Urine analysis:    Component Value Date/Time   COLORURINE YELLOW 11/10/2015 1521   APPEARANCEUR CLEAR 11/10/2015 1521   LABSPEC 1.013 11/10/2015 1521   PHURINE 5.5 11/10/2015 1521   GLUCOSEU NEGATIVE 11/10/2015 1521   HGBUR NEGATIVE 11/10/2015 1521   BILIRUBINUR NEGATIVE 11/10/2015 1521   KETONESUR  NEGATIVE 11/10/2015 1521   PROTEINUR 100* 11/10/2015 1521   UROBILINOGEN 0.2 03/02/2014 1508   NITRITE NEGATIVE 11/10/2015 1521   LEUKOCYTESUR TRACE* 11/10/2015 1521   Sepsis Labs: Invalid input(s): PROCALCITONIN, LACTICIDVEN  Recent Results (from the past 240 hour(s))  Surgical pcr screen     Status: None   Collection Time: 12/04/15  7:08 AM  Result Value Ref Range Status   MRSA, PCR NEGATIVE NEGATIVE Final   Staphylococcus aureus NEGATIVE NEGATIVE Final    Comment:        The Xpert SA Assay (FDA approved for NASAL specimens in patients over 62 years of age), is one component of a comprehensive surveillance program.  Test performance has been validated by [facility] for patients greater than or equal to 21 year old. It is not intended to diagnose infection nor to guide or monitor treatment.     Radiology Studies: No results found. Scheduled Meds: . amiodarone  200 mg Oral Daily  . amLODipine  5 mg Oral QHS  . atorvastatin  80 mg Oral Daily  . bisacodyl  10 mg Rectal Once  . [START ON 12/07/2015] darbepoetin (ARANESP) injection - DIALYSIS  60 mcg Intravenous Q Thu-HD  . docusate sodium  100 mg Oral BID  . insulin aspart  0-15 Units Subcutaneous TID WC  . insulin aspart  0-5 Units Subcutaneous QHS  . insulin aspart protamine- aspart  15 Units Subcutaneous BID WC  . loratadine  10 mg Oral Daily  . multivitamin  1 tablet Oral QHS  . polyethylene glycol  17 g Oral BID  . protein supplement  1 scoop Oral TID WC  . sevelamer carbonate  800 mg Oral TID WC  . sodium chloride flush  10-40 mL Intracatheter Q12H  . sodium chloride flush  10-40 mL Intracatheter Q12H  . sodium chloride flush  3 mL Intravenous Q12H  . warfarin  7.5 mg Oral ONCE-1800  . Warfarin - Pharmacist Dosing Inpatient   Does not apply q1800   Continuous Infusions: . sodium chloride 10 mL/hr at 12/04/15 0944    Marzetta Board, MD, PhD [facility] Pager [phone]  If 7PM-7AM, please contact  night-coverage [email or web address] Password [facility] 12/05/2015, 3:53 PM

## 2015-12-05 NOTE — Progress Notes (Signed)
Physical Therapy Treatment Patient Details Name: Briana [name] MRN: [id number] DOB: 1945/11/23 Today's Date: 12/05/2015    History of Present Illness Pt is a 70 y/o F who was just admitted last month for multi week (5/6-5/24) hospital stay that started with treatment in the ICU for PNA, followed by diuresis for CHF exacerbation. This was then complicated further by A.Fib RVR which was unstable and required cardioversion. Around that time she also had AKF develop that thankfully would resolve. Also had NSTEMI with trop elevation during that stay that was managed medically, no cath due to kidney function.Pt presents this admission w/ c/o SOB, peripheral edema, and DOE.  Admitting Dx: acute on chronic diastolic CHF.  Pt's PMH includes PVD, depression, anxiety, neuropathy, a-fib.    PT Comments    Patient progressing with mobility and now feels able to go home with HHPT and plans on outpatient dialysis in [place].  RN case manager made aware of pt DME request for w/c rather than RW due to concerns over weakness after dialysis.  States daughter can transport her initially, but hopes to be able to set up alternate transportation once she is established to decrease strain on her family.  PT to follow acutely.   Follow Up Recommendations  Home health PT;Supervision - Intermittent     Equipment Recommendations  Wheelchair (measurements PT)    Recommendations for Other Services       Precautions / Restrictions Precautions Precautions: Fall    Mobility  Bed Mobility               General bed mobility comments: up in chair  Transfers Overall transfer level: Modified independent     Sit to Stand: Modified independent (Device/Increase time)         General transfer comment: from recliner  Ambulation/Gait Ambulation/Gait assistance: Supervision Ambulation Distance (Feet): 200 Feet Assistive device: Rolling walker (2 wheeled) Gait Pattern/deviations: Step-through  pattern;Decreased stride length     General Gait Details: cues for safety with turns with walker, pt reports doesn't like using walker, prefers cane   Stairs            Wheelchair Mobility    Modified Rankin (Stroke Patients Only)       Balance Overall balance assessment: Needs assistance           Standing balance-Leahy Scale: Fair Standing balance comment: neuropathy in feet, but stable with static standing, minguard for ambulation in room no device for safety                    Cognition Arousal/Alertness: Awake/alert Behavior During Therapy: WFL for tasks assessed/performed Overall Cognitive Status: Within Functional Limits for tasks assessed                      Exercises      General Comments General comments (skin integrity, edema, etc.): Discussion regarding transportation to dialysis and equipment needs for home.  Patient feels she would do better with a wheelchair than with a rollator or walker,  RNCM made aware of pt need for lightweight (tranport) w/c for use for outings including dialysis.  Patient educated per RNCM need to talk with social services office in her area about transportation as she lives in [place], but plans to do dialysis in [place], [place].       Pertinent Vitals/Pain Faces Pain Scale: Hurts even more Pain Location: L arm after procedure yesterday for basilic vein transposition Pain Descriptors / Indicators: Sore  Pain Intervention(s): Repositioned;Monitored during session    Home Living                      Prior Function            PT Goals (current goals can now be found in the care plan section) Acute Rehab PT Goals Time For Goal Achievement: January 01, 2016 Progress towards PT goals: Goals met and updated - see care plan    Frequency  Min 3X/week    PT Plan Discharge plan needs to be updated;Frequency needs to be updated;Equipment recommendations need to be updated    Co-evaluation             End of  Session Equipment Utilized During Treatment: Gait belt Activity Tolerance: Patient tolerated treatment well Patient left: in chair;with call bell/phone within reach     Time: 1528-1549 PT Time Calculation (min) (ACUTE ONLY): 21 min  Charges:  $Gait Training: 8-22 mins                    G Codes:      Reginia Naas January 01, 2016, 5:03 PM  Magda Kiel, [place] 01-01-16

## 2015-12-06 ENCOUNTER — Telehealth: Payer: Self-pay | Admitting: Vascular Surgery

## 2015-12-06 DIAGNOSIS — N186 End stage renal disease: Secondary | ICD-10-CM

## 2015-12-06 LAB — RENAL FUNCTION PANEL
ANION GAP: 9 (ref 5–15)
Albumin: 3 g/dL — ABNORMAL LOW (ref 3.5–5.0)
BUN: 27 mg/dL — ABNORMAL HIGH (ref 6–20)
CHLORIDE: 97 mmol/L — AB (ref 101–111)
CO2: 27 mmol/L (ref 22–32)
Calcium: 8 mg/dL — ABNORMAL LOW (ref 8.9–10.3)
Creatinine, Ser: 2.81 mg/dL — ABNORMAL HIGH (ref 0.44–1.00)
GFR calc Af Amer: 19 mL/min — ABNORMAL LOW (ref >60)
GFR calc non Af Amer: 16 mL/min — ABNORMAL LOW (ref >60)
GLUCOSE: 109 mg/dL — AB (ref 65–99)
POTASSIUM: 4.2 mmol/L (ref 3.5–5.1)
Phosphorus: 3.7 mg/dL (ref 2.5–4.6)
Sodium: 133 mmol/L — ABNORMAL LOW (ref 135–145)

## 2015-12-06 LAB — CBC
HEMATOCRIT: 30.2 % — AB (ref 36.0–46.0)
Hemoglobin: 8.9 g/dL — ABNORMAL LOW (ref 12.0–15.0)
MCH: 27.7 pg (ref 26.0–34.0)
MCHC: 29.5 g/dL — AB (ref 30.0–36.0)
MCV: 94.1 fL (ref 78.0–100.0)
Platelets: 157 10*3/uL (ref 150–400)
RBC: 3.21 MIL/uL — ABNORMAL LOW (ref 3.87–5.11)
RDW: 17.7 % — ABNORMAL HIGH (ref 11.5–15.5)
WBC: 6.8 10*3/uL (ref 4.0–10.5)

## 2015-12-06 LAB — HEPARIN LEVEL (UNFRACTIONATED)
HEPARIN UNFRACTIONATED: 0.2 [IU]/mL — AB (ref 0.30–0.70)
Heparin Unfractionated: 0.41 IU/mL (ref 0.30–0.70)

## 2015-12-06 LAB — PROTIME-INR
INR: 1.18 (ref 0.00–1.49)
Prothrombin Time: 15.1 seconds (ref 11.6–15.2)

## 2015-12-06 LAB — MAGNESIUM: MAGNESIUM: 2.2 mg/dL (ref 1.7–2.4)

## 2015-12-06 LAB — GLUCOSE, CAPILLARY
GLUCOSE-CAPILLARY: 163 mg/dL — AB (ref 65–99)
Glucose-Capillary: 137 mg/dL — ABNORMAL HIGH (ref 65–99)
Glucose-Capillary: 187 mg/dL — ABNORMAL HIGH (ref 65–99)
Glucose-Capillary: 227 mg/dL — ABNORMAL HIGH (ref 65–99)

## 2015-12-06 MED ORDER — WARFARIN SODIUM 10 MG PO TABS
10.0000 mg | ORAL_TABLET | Freq: Once | ORAL | Status: AC
  Administered 2015-12-06: 10 mg via ORAL
  Filled 2015-12-06: qty 1

## 2015-12-06 MED ORDER — DARBEPOETIN ALFA 150 MCG/0.3ML IJ SOSY
150.0000 ug | PREFILLED_SYRINGE | INTRAMUSCULAR | Status: DC
  Administered 2015-12-07: 150 ug via INTRAVENOUS
  Filled 2015-12-06: qty 0.3

## 2015-12-06 NOTE — Progress Notes (Signed)
Patient ID: Briana [name], female   DOB: 18-Mar-1946, 70 y.o.   MRN: [id number]  [facility] Progress Note   Assessment/ Plan:   1. Acute exacerbation of diastolic CHF: Improved with ultrafiltration at hemodialysis after failure to respond to diuretic therapy. Now is euvolemic on HD 2. New ESRD: CLIP pending (Maybe to [facility] now?)  On THS schedule as inpatient.  s/p conversion of temporary dialysis catheter to tunneled dialysis catheter on 7/5 and s/p BVT 7/10 with VVS. She states that she would like to undergo chronic hemodialysis in [place], [place] -- Unit is full and now might be in [place] which is ok with her. HD tomorrow if here 3. Anemia: Hemoglobin stable-sp IV Fe, inc ESA given undertarget. .  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 Nutrition: Albumin is low-continue encouraging lean protein intake/oral nutritional supplements 6. Hypertension: Blood pressure is marginally elevated-anticipate will improve with ultrafiltration at hemodialysis- also with low sodium indicates still some volume overload  Subjective:   CLIP not complete, [place] Response HD yesterday: post weight 94.4kg, 3L UF; tolerated well     Objective:   BP 135/40 mmHg  Pulse 50  Temp(Src) 98.1 F (36.7 C) (Oral)  Resp 18  Ht 5\' 4"  (1.626 m)  Wt 94.892 kg (209 lb 3.2 oz)  BMI 35.89 kg/m2  SpO2 100%  Physical Exam: Gen: Comfortable in bed CVS: Pulse regular bradycardia, S1 and S2 normal Resp: Decreased breath sounds over bases-no distinct rales or rhonchi Abd: Soft, obese, nontender Ext: minimal  lower extremity edema  Labs: BMET  Recent Labs Lab 11/30/15 0655 12/01/15 0418 12/02/15 0435 12/03/15 0927 12/04/15 0401 12/05/15 0314 12/06/15 0054  NA 133* 134* 134* 131* 131* 130* 133*  K 3.5 3.7 4.1 4.3 3.9 4.6 4.2  CL 100* 98* 98* 96* 96* 95* 97*  CO2 26 26 23 23 23 25 27   GLUCOSE 166* 131* 101* 250* 123*  88 109*  BUN 34* 22* 43* 30* 47* 51* 27*  CREATININE 2.74* 3.04* 3.56* 3.27* 3.88* 4.11* 2.81*  CALCIUM 8.2* 8.3* 8.6* 8.6* 8.7* 8.5* 8.0*  PHOS 3.9 3.9 4.3 3.7 4.9* 6.1* 3.7   CBC  Recent Labs Lab 12/02/15 0333 12/03/15 0927 12/04/15 0401 12/06/15 0054  WBC 8.2 6.6 7.4 6.8  HGB 9.5* 9.8* 9.3* 8.9*  HCT 30.5* 32.5* 29.3* 30.2*  MCV 91.0 93.1 90.4 94.1  PLT 217 182 182 157   Medications:    . amiodarone  200 mg Oral Daily  . amLODipine  5 mg Oral QHS  . atorvastatin  80 mg Oral Daily  . bisacodyl  10 mg Rectal Once  . [START ON 12/07/2015] darbepoetin (ARANESP) injection - DIALYSIS  60 mcg Intravenous Q Thu-HD  . docusate sodium  100 mg Oral BID  . insulin aspart  0-15 Units Subcutaneous TID WC  . insulin aspart  0-5 Units Subcutaneous QHS  . insulin aspart protamine- aspart  15 Units Subcutaneous BID WC  . loratadine  10 mg Oral Daily  . multivitamin  1 tablet Oral QHS  . polyethylene glycol  17 g Oral BID  . protein supplement  1 scoop Oral TID WC  . sevelamer carbonate  800 mg Oral TID WC  . sodium chloride flush  10-40 mL Intracatheter Q12H  . sodium chloride flush  10-40 mL Intracatheter Q12H  . sodium chloride flush  3 mL Intravenous Q12H  . Warfarin - Pharmacist Dosing Inpatient  Does not apply q1800   Pearson Grippe B   12/06/2015, 10:17 AM

## 2015-12-06 NOTE — Telephone Encounter (Signed)
-----   Message from Mena Goes, RN sent at 12/05/2015 10:55 AM EDT ----- Regarding: 4-6 weeks postop with duplex   ----- Message -----    From: Gabriel Earing, PA-C    Sent: 12/05/2015   8:35 AM      To: Vvs Charge Pool  S/p left BVT 12/04/15.  F/u with Dr. Donnetta Hutching in 4-6 weeks with duplex.  Thanks, Aldona Bar

## 2015-12-06 NOTE — Progress Notes (Signed)
PROGRESS NOTE  Briana [name] [id number] DOB: 1946/03/05 DOA: 11/10/2015 PCP: Antionette Fairy, PA-C  Late entry for the note  LOS: 26 days    Subjective: No SOB, no chest pain, no fever or chills. On IV heparin bridge. Seen alongside patient's daughter.   Brief Narrative: 70 y.o. female with past medical history significant for chronic diastolic CHF (in [id number] with EF 55%), hypertension, dyslipidemia, atrial fibrillation on anticoagulation with Coumadin, recently hospitalized 5/6-5/24 of 2017 for pneumonia which was then complicated further by A.Fib RVR which was unstable and required cardioversion. She also had NSTEMI which was managed medically and no cardiac cath due to acute kidney failure. She was discharged to inpatient rehab.   Patient presented to [facility] with worsening shortness of breath over past 2 weeks prior to this admission, or extremity edema. She was seen by primary care physician the week prior to the admission and was instructed to take Lasix twice daily whereas before she was on 40 mg once a day Lasix. There was no significant symptomatic relief for which reason she presented to ED for further evaluation.   Hospital course complicated with worsening renal function and fluid overload for which nephrology was consulted. Patient was eventually moved to ICU and was on CRRT between 6/28 and 6/30 with significant improvement in her fluid status. Transferred to telemetry 6/30. Did not respond to diuresis and was becoming more fluid overload and was started on regular HD 7/3. Tunneled cath laced on 7/5.   Assessment & Plan: Principal Problem:   Acute on chronic diastolic (congestive) heart failure (HCC) Active Problems:   Acute on chronic diastolic CHF (congestive heart failure) (HCC)   Controlled diabetes mellitus type 2 with complications (HCC)   CAD in native artery   Chronic kidney disease (CKD), stage IV (severe) (HCC)   Essential hypertension   Anemia  of chronic kidney failure   Acute on chronic diastolic CHF - Improved with UF. - patient did not respond well initially to diuresis requiring CRRT for 72 h 6/28 - 6/30.  - Weight since 6/23: 109.7 kg --> 109.2 kg --> 108.8 kg --> 109.3 kg --> 108.1 kg >> 104.4 >> 97 kg >> 91 /// CRRT stopped > weight 94 kg >> 96 >> 96.5 until 7/3 when HD started per nephrology. - Did not respond to po Lasix with minimal UOP on 7/1 after CRRT, changed to IV Lasix on 7/2, with that she had suboptimal UOP and was started on HD 7/3 -Cardiology signed off, continue BiDil.  CKD stage 5, now ESRD - Pursue Dialysis Chair. Consider changing IV Heparin to [place] Lovenox once daily for bridging once HD Chair is arranged and patient's INR is still subtherapeutic.  - Creatinine 3 weeks ago 2.75 - Renal following - BUN / Cr / weight continues to climb despite IV Lasix Lasix, had CRRT, did not respond to diuretics afterwards and had her first hemodialysis on 7/3 using temporary IJ catheter. She will undergo again hemodialysis on 7/4.  -Nephrology recommend to continue hemodialysis, HD tunneled catheter placed on 11/29/15 by Dr. Earleen Newport. -Vascular surgery consulted, Fistula creation scheduled initially on 7/7 but postponed to Monday 7/10. -I tried to talk to the vascular surgery PA, I was informed by the nurse in the OR that there is no provider available to do the procedure today or over the weekend, the procedure was postponed due to unforeseeable circumstances. -Cannot discharge patient because she is on heparin drip, will keep on heparin drip  until Monday and start Coumadin after surgery. -Left basilic vein transposition done earlier Dr. Donnetta Hutching. -Resumed the anticoagulation with heparin bridge and Coumadin.  Hyponatremia - 129>>132 >> 133, improved with HD  Anemia of chronic kidney disease stage 4 - Hemoglobin 7.6 >> 8.3 >> 8.8. Overall stable  Atrial fibrillation  - CHADS vasc score 5 - Recent TEE 09/2015 done for  cardioversion - Continue amiodarone 200 mg daily and metoprolol 25 mg twice daily.  - Coumadin on hold pending tunneled cath, dosing per pharmacy. Now on heparin gtt given INR < 2 - We'll resume heparin and Coumadin, patient will be appropriate for discharge when INR >2.  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 - Continue 70/30 mix 15 units twice daily - Continue sliding-scale insulin - CBGs controlled 126-139  Dyslipidemia associated with type 2 diabetes mellitus - Continue Lipitor 80 mg at bedtime - Continue daily aspirin   Essential hypertension - Continue Norvasc 10 mg daily, Bidil TID - hold metoprolol for now due to bradycardia   DVT prophylaxis: Coumadin Code Status: Full Family Communication: no family bedside Disposition Plan: Home after fistula creation and INR gets therapeutic  Consultants:   Nephrology  Cardiology - s/o 6/30  Procedures:   CRRT  HD  Antimicrobials:  None     Objective: Filed Vitals:   12/05/15 1154 12/05/15 2040 12/06/15 0402 12/06/15 1217  BP: 121/33 124/32 135/40 119/79  Pulse: 56 56 50 50  Temp: 98 F (36.7 C) 99.6 F (37.6 C) 98.1 F (36.7 C) 98.2 F (36.8 C)  TempSrc: Oral Oral Oral Oral  Resp: 18 16 18    Height:      Weight:   94.892 kg (209 lb 3.2 oz)   SpO2: 98% 97% 100% 99%    Intake/Output Summary (Last 24 hours) at 12/06/15 1321 Last data filed at 12/06/15 0820  Gross per 24 hour  Intake 1364.15 ml  Output      1 ml  Net 1363.15 ml   Filed Weights   12/05/15 0710 12/05/15 1110 12/06/15 0402  Weight: 97.4 kg (214 lb 11.7 oz) 94.4 kg (208 lb 1.8 oz) 94.892 kg (209 lb 3.2 oz)    Examination: Constitutional: NAD Filed Vitals:   12/05/15 1154 12/05/15 2040 12/06/15 0402 12/06/15 1217  BP: 121/33 124/32 135/40 119/79  Pulse: 56 56 50 50  Temp: 98 F (36.7 C) 99.6 F (37.6 C) 98.1 F (36.7 C) 98.2 F (36.8 C)  TempSrc: Oral Oral Oral Oral  Resp: 18 16 18    Height:        Weight:   94.892 kg (209 lb 3.2 oz)   SpO2: 98% 97% 100% 99%   Eyes: PERRL, lids and conjunctivae normal Respiratory: clear to auscultation bilaterally, no wheezing, no crackles. Normal respiratory effort. No accessory muscle use.  Cardiovascular: Regular rate and rhythm, no murmurs / rubs / gallops. Trace LE edema. Abdomen: no tenderness. Bowel sounds positive.  Musculoskeletal: no clubbing / cyanosis.  Neurologic: non focal   Data Reviewed: I have personally reviewed following labs and imaging studies  CBC:  Recent Labs Lab 12/01/15 0418 12/02/15 0333 12/03/15 0927 12/04/15 0401 12/06/15 0054  WBC 8.9 8.2 6.6 7.4 6.8  HGB 10.2* 9.5* 9.8* 9.3* 8.9*  HCT 33.2* 30.5* 32.5* 29.3* 30.2*  MCV 91.7 91.0 93.1 90.4 94.1  PLT 246 217 182 182 [id number]   Basic Metabolic Panel:  Recent Labs Lab 12/02/15 0333 12/02/15 0435 12/03/15 [id number] 12/04/15 0401 12/05/15 0314 12/06/15 0054  NA  --  134* 131* 131* 130* 133*  K  --  4.1 4.3 3.9 4.6 4.2  CL  --  98* 96* 96* 95* 97*  CO2  --  23 23 23 25 27   GLUCOSE  --  101* 250* 123* 88 109*  BUN  --  43* 30* 47* 51* 27*  CREATININE  --  3.56* 3.27* 3.88* 4.11* 2.81*  CALCIUM  --  8.6* 8.6* 8.7* 8.5* 8.0*  MG 2.4  --  2.2 2.4 2.5* 2.2  PHOS  --  4.3 3.7 4.9* 6.1* 3.7   GFR: Estimated Creatinine Clearance: 20.8 mL/min (by C-G formula based on Cr of 2.81). Liver Function Tests:  Recent Labs Lab 12/02/15 0435 12/03/15 0927 12/04/15 0401 12/05/15 0314 12/06/15 0054  ALBUMIN 3.3* 3.2* 3.1* 3.0* 3.0*   Coagulation Profile:  Recent Labs Lab 12/02/15 0333 12/03/15 0927 12/04/15 0401 12/05/15 0314 12/06/15 0054  INR 1.21 1.21 1.16 1.19 1.18   CBG:  Recent Labs Lab 12/05/15 1257 12/05/15 1645 12/05/15 2121 12/06/15 0611 12/06/15 1207  GLUCAP 168* 205* 111* 137* 163*   Anemia Panel: No results for input(s): VITAMINB12, FOLATE, FERRITIN, TIBC, IRON, RETICCTPCT in the last 72 hours. Urine analysis:    Component Value  Date/Time   COLORURINE YELLOW 11/10/2015 1521   APPEARANCEUR CLEAR 11/10/2015 1521   LABSPEC 1.013 11/10/2015 1521   PHURINE 5.5 11/10/2015 1521   GLUCOSEU NEGATIVE 11/10/2015 1521   HGBUR NEGATIVE 11/10/2015 1521   BILIRUBINUR NEGATIVE 11/10/2015 1521   KETONESUR NEGATIVE 11/10/2015 1521   PROTEINUR 100* 11/10/2015 1521   UROBILINOGEN 0.2 03/02/2014 1508   NITRITE NEGATIVE 11/10/2015 1521   LEUKOCYTESUR TRACE* 11/10/2015 1521   Sepsis Labs: Invalid input(s): PROCALCITONIN, LACTICIDVEN  Recent Results (from the past 240 hour(s))  Surgical pcr screen     Status: None   Collection Time: 12/04/15  7:08 AM  Result Value Ref Range Status   MRSA, PCR NEGATIVE NEGATIVE Final   Staphylococcus aureus NEGATIVE NEGATIVE Final    Comment:        The Xpert SA Assay (FDA approved for NASAL specimens in patients over 57 years of age), is one component of a comprehensive surveillance program.  Test performance has been validated by [facility] for patients greater than or equal to 7 year old. It is not intended to diagnose infection nor to guide or monitor treatment.     Radiology Studies: No results found. Scheduled Meds: . amiodarone  200 mg Oral Daily  . amLODipine  5 mg Oral QHS  . atorvastatin  80 mg Oral Daily  . bisacodyl  10 mg Rectal Once  . [START ON 12/07/2015] darbepoetin (ARANESP) injection - DIALYSIS  150 mcg Intravenous Q Thu-HD  . docusate sodium  100 mg Oral BID  . insulin aspart  0-15 Units Subcutaneous TID WC  . insulin aspart  0-5 Units Subcutaneous QHS  . insulin aspart protamine- aspart  15 Units Subcutaneous BID WC  . loratadine  10 mg Oral Daily  . multivitamin  1 tablet Oral QHS  . polyethylene glycol  17 g Oral BID  . protein supplement  1 scoop Oral TID WC  . sevelamer carbonate  800 mg Oral TID WC  . sodium chloride flush  10-40 mL Intracatheter Q12H  . sodium chloride flush  10-40 mL Intracatheter Q12H  . sodium chloride flush  3 mL Intravenous  Q12H  . warfarin  10 mg Oral ONCE-1800  . Warfarin - Pharmacist Dosing Inpatient  Does not apply q1800   Continuous Infusions: . sodium chloride 10 mL/hr at 12/04/15 0944  . heparin 1,300 Units/hr (12/06/15 0327)    Rae Lips A Elmahi Pager: [phone] 12/06/2015, 1:21 PM   If 7PM-7AM, please contact night-coverage [email or web address] Password [email or web address] 12/06/2015, 1:21 PM

## 2015-12-06 NOTE — Progress Notes (Signed)
[place] for Heparin Indication: atrial fibrillation  Allergies  Allergen Reactions  . Oxycodone Other (See Comments)    hallucinations  . Clonazepam Other (See Comments)    Unknown reaction  . Gemfibrozil Other (See Comments)    Patient is not aware of this allergy but states that she has side effects to a cholesterol medication in the past  . Carbamazepine Other (See Comments)    Reaction to tegretol - loopy  . Macrobid [Nitrofurantoin] Nausea And Vomiting    Patient Measurements: Height: 5\' 4"  (162.6 cm) Weight: 208 lb 1.8 oz (94.4 kg) IBW/kg (Calculated) : 54.7 Heparin Dosing Weight: 76.4 kg  Vital Signs: Temp: 99.6 F (37.6 C) (07/11 2040) Temp Source: Oral (07/11 2040) BP: 124/32 mmHg (07/11 2040) Pulse Rate: 56 (07/11 2040)  Labs:  Recent Labs  12/03/15 0927 12/04/15 0401 12/05/15 0314 12/06/15 0054  HGB 9.8* 9.3*  --  8.9*  HCT 32.5* 29.3*  --  30.2*  PLT 182 182  --  157  LABPROT 15.4* 14.9 15.2  --   INR 1.21 1.16 1.19  --   HEPARINUNFRC 0.36 0.50  --  0.20*  CREATININE 3.27* 3.88* 4.11*  --     Estimated Creatinine Clearance: 14.2 mL/min (by C-G formula based on Cr of 4.11).   Assessment: [name] with h/o Afib s/p AVF 7/10, for heparin   Goal of Therapy:  INR 2-3 Heparin level 0.3-0.7 units/ml Monitor platelets by anticoagulation protocol: Yes   Plan:  Increase Heparin 1300 units/hr Check heparin level in 8 hours.  Phillis Knack, PharmD, BCPS 12/06/2015 2:32 AM

## 2015-12-06 NOTE — Telephone Encounter (Signed)
Sched lab 8/22 at 4 and MD 8/29 at 11:15. Lm on hm# to inform pt.

## 2015-12-06 NOTE — Progress Notes (Signed)
12/06/2015 1:26 PM Hemodialysis Outpatient Note; Briana [name] has been accepted at the [facility] on a Tuesday, Thursday and Saturday 2nd shift schedule.The center is located at [address] [place] [address]  The center can begin treatment on Thursday July 13th at 1pm. Thank you. Gordy Savers

## 2015-12-06 NOTE — Progress Notes (Signed)
[place] for Heparin + Warfarin Indication: atrial fibrillation  Allergies  Allergen Reactions  . Oxycodone Other (See Comments)    hallucinations  . Clonazepam Other (See Comments)    Unknown reaction  . Gemfibrozil Other (See Comments)    Patient is not aware of this allergy but states that she has side effects to a cholesterol medication in the past  . Carbamazepine Other (See Comments)    Reaction to tegretol - loopy  . Macrobid [Nitrofurantoin] Nausea And Vomiting    Patient Measurements: Height: 5\' 4"  (162.6 cm) Weight: 209 lb 3.2 oz (94.892 kg) IBW/kg (Calculated) : 54.7 Heparin Dosing Weight: 76.4 kg  Vital Signs: Temp: 98.1 F (36.7 C) (07/12 0402) Temp Source: Oral (07/12 0402) BP: 135/40 mmHg (07/12 0402) Pulse Rate: 50 (07/12 0402)  Labs:  Recent Labs  12/04/15 0401 12/05/15 0314 12/06/15 0054 12/06/15 1027  HGB 9.3*  --  8.9*  --   HCT 29.3*  --  30.2*  --   PLT 182  --  157  --   LABPROT 14.9 15.2 15.1  --   INR 1.16 1.19 1.18  --   HEPARINUNFRC 0.50  --  0.20* 0.41  CREATININE 3.88* 4.11* 2.81*  --     Estimated Creatinine Clearance: 20.8 mL/min (by C-G formula based on Cr of 2.81).   Assessment: AC/Heme: Warfarin PTA for Hx Afib. CHADS vasc score 5. Heparin gtt started 7/4 when INR <2 for IR placement of tunneled cath 7/5. IR fistula placement 7/10.  --*PTA: 5mg  daily except 2.5mg  on Tuesday and Thursday (last PTA dose 6/15) INR on admit 2.55 Hgb 9.3>8.9. Pltc 182>157 HL 0.41 on 1300 u/hr INR 1.18 no significant change. INR remains subtherapeutic after 7.5 mg x 2 days.   Goal of Therapy:  INR 2-3 Heparin level 0.3-0.7 units/ml Monitor platelets by anticoagulation protocol: Yes   Plan:  Continue Heparin 1300 units/hr Increase warfarin to 10 mg x1 tonight Check Hep, INR, CBC daily  Briana [name], PharmD PGY1 Pharmacy Resident  12/06/2015 11:22 AM    I discussed / reviewed the pharmacy note  by Dr. Thurmond Butts and I agree with the resident's findings and plans as documented.  Briana [name], PharmD, [place] Clinical Staff Pharmacist Pager [phone]

## 2015-12-06 NOTE — Progress Notes (Signed)
Physical Therapy Treatment Patient Details Name: Briana [name] MRN: [id number] DOB: December 17, 1945 Today's Date: 12/06/2015    History of Present Illness Pt is a 70 y/o F who was just admitted last month for multi week (5/6-5/24) hospital stay that started with treatment in the ICU for PNA, followed by diuresis for CHF exacerbation. This was then complicated further by A.Fib RVR which was unstable and required cardioversion. Around that time she also had AKF develop that thankfully would resolve. Also had NSTEMI with trop elevation during that stay that was managed medically, no cath due to kidney function.Pt presents this admission w/ c/o SOB, peripheral edema, and DOE.  Admitting Dx: acute on chronic diastolic CHF.  Pt's PMH includes PVD, depression, anxiety, neuropathy, a-fib.    PT Comments    Pt is demonstrating some improvements of her ability but clearly will need to be in SNF level of care for further progression of gait and safety.  Her LUE is very limiting and painful so expect her to continue to need extra help to avoid its use in the near future.  Will follow acutely for strengthening and gait/balance as pt is going to be home alone when she finishes rehab.  Follow Up Recommendations  Home health PT;Supervision - Intermittent     Equipment Recommendations  Wheelchair (measurements PT)    Recommendations for Other Services       Precautions / Restrictions Precautions Precautions: Fall (telemetry) Precaution Comments: has resting pulse of 54 (notes tired feeling) Restrictions Weight Bearing Restrictions: No    Mobility  Bed Mobility               General bed mobility comments: up when PT arrived  Transfers Overall transfer level: Needs assistance Equipment used: 1 person hand held assist Transfers: Stand Pivot Transfers;Sit to/from Stand Sit to Stand: Min guard Stand pivot transfers: Min guard       General transfer comment: used her R arm only at PT  request  Ambulation/Gait Ambulation/Gait assistance: Supervision Ambulation Distance (Feet): 230 Feet Assistive device: Rolling walker (2 wheeled) Gait Pattern/deviations: Step-through pattern;Decreased stride length;Narrow base of support;Drifts right/left Gait velocity: decreased Gait velocity interpretation: Below normal speed for age/gender General Gait Details: Pt acknowledges she needs the walker   Stairs            Wheelchair Mobility    Modified Rankin (Stroke Patients Only)       Balance Overall balance assessment: Needs assistance           Standing balance-Leahy Scale: Fair                      Cognition Arousal/Alertness: Awake/alert Behavior During Therapy: WFL for tasks assessed/performed Overall Cognitive Status: Within Functional Limits for tasks assessed                      Exercises General Exercises - Lower Extremity Ankle Circles/Pumps: AROM;Both;5 reps Quad Sets: Strengthening;Both;15 reps Gluteal Sets: Strengthening;Both;15 reps Long Arc Quad: Strengthening;Both;10 reps Heel Slides: Strengthening;Both;10 reps    General Comments General comments (skin integrity, edema, etc.): Pt was able to get up to walk with light help from chair avoiding LUE use      Pertinent Vitals/Pain Pain Assessment: Faces Faces Pain Scale: Hurts whole lot Pain Location: L upper underarm and upper arm Pain Descriptors / Indicators: Operative site guarding;Aching Pain Intervention(s): Limited activity within patient's tolerance;Repositioned;Monitored during session    Home Living  Prior Function            PT Goals (current goals can now be found in the care plan section) Acute Rehab PT Goals Patient Stated Goal: to have less L arm pain Progress towards PT goals: Progressing toward goals    Frequency  Min 3X/week    PT Plan Current plan remains appropriate    Co-evaluation             End of  Session Equipment Utilized During Treatment: Gait belt Activity Tolerance: Patient tolerated treatment well Patient left: in chair;with call bell/phone within reach     Time: 1110-1144 PT Time Calculation (min) (ACUTE ONLY): 34 min  Charges:  $Gait Training: 8-22 mins $Therapeutic Exercise: 8-22 mins                    G Codes:      Ramond Dial 27-Dec-2015, 12:37 PM   Mee Hives, PT MS Acute Rehab Dept. Number: [place] and [place]

## 2015-12-07 LAB — RENAL FUNCTION PANEL
Albumin: 2.9 g/dL — ABNORMAL LOW (ref 3.5–5.0)
Anion gap: 11 (ref 5–15)
BUN: 47 mg/dL — ABNORMAL HIGH (ref 6–20)
CALCIUM: 8.4 mg/dL — AB (ref 8.9–10.3)
CO2: 25 mmol/L (ref 22–32)
CREATININE: 3.7 mg/dL — AB (ref 0.44–1.00)
Chloride: 93 mmol/L — ABNORMAL LOW (ref 101–111)
GFR calc Af Amer: 13 mL/min — ABNORMAL LOW (ref >60)
GFR calc non Af Amer: 11 mL/min — ABNORMAL LOW (ref >60)
GLUCOSE: 138 mg/dL — AB (ref 65–99)
Phosphorus: 5.2 mg/dL — ABNORMAL HIGH (ref 2.5–4.6)
Potassium: 4 mmol/L (ref 3.5–5.1)
SODIUM: 129 mmol/L — AB (ref 135–145)

## 2015-12-07 LAB — GLUCOSE, CAPILLARY
GLUCOSE-CAPILLARY: 176 mg/dL — AB (ref 65–99)
GLUCOSE-CAPILLARY: 143 mg/dL — AB (ref 65–99)
Glucose-Capillary: 137 mg/dL — ABNORMAL HIGH (ref 65–99)
Glucose-Capillary: 137 mg/dL — ABNORMAL HIGH (ref 65–99)

## 2015-12-07 LAB — CBC
HCT: 27.9 % — ABNORMAL LOW (ref 36.0–46.0)
Hemoglobin: 8.8 g/dL — ABNORMAL LOW (ref 12.0–15.0)
MCH: 28.9 pg (ref 26.0–34.0)
MCHC: 31.5 g/dL (ref 30.0–36.0)
MCV: 91.5 fL (ref 78.0–100.0)
PLATELETS: 141 10*3/uL — AB (ref 150–400)
RBC: 3.05 MIL/uL — ABNORMAL LOW (ref 3.87–5.11)
RDW: 17.7 % — AB (ref 11.5–15.5)
WBC: 5.8 10*3/uL (ref 4.0–10.5)

## 2015-12-07 LAB — PROTIME-INR
INR: 1.23 (ref 0.00–1.49)
Prothrombin Time: 15.7 seconds — ABNORMAL HIGH (ref 11.6–15.2)

## 2015-12-07 LAB — HEPARIN LEVEL (UNFRACTIONATED)
Heparin Unfractionated: 0.28 IU/mL — ABNORMAL LOW (ref 0.30–0.70)
Heparin Unfractionated: 0.39 IU/mL (ref 0.30–0.70)

## 2015-12-07 LAB — MAGNESIUM: Magnesium: 2.3 mg/dL (ref 1.7–2.4)

## 2015-12-07 MED ORDER — HYDROCODONE-ACETAMINOPHEN 5-325 MG PO TABS
ORAL_TABLET | ORAL | Status: AC
  Filled 2015-12-07: qty 1

## 2015-12-07 MED ORDER — WARFARIN SODIUM 10 MG PO TABS
10.0000 mg | ORAL_TABLET | Freq: Once | ORAL | Status: AC
  Administered 2015-12-07: 10 mg via ORAL
  Filled 2015-12-07: qty 1

## 2015-12-07 MED ORDER — DARBEPOETIN ALFA 150 MCG/0.3ML IJ SOSY
PREFILLED_SYRINGE | INTRAMUSCULAR | Status: AC
  Filled 2015-12-07: qty 0.3

## 2015-12-07 NOTE — Progress Notes (Signed)
Heparin infusion rate increased to 1400 units/hr or 14 mL/hr.  Infusing without difficulty.  Patient education provided with verbalized understanding and teach back.  2nd RN verification performed by Ronnie C.  Report provided to Dialysis.  Patient stable in NAD.  OOB to chair awaiting treatment.

## 2015-12-07 NOTE — Progress Notes (Signed)
PROGRESS NOTE  Briana [name] [id number] DOB: 12/14/1945 DOA: 11/10/2015 PCP: Antionette Fairy, PA-C  Late entry for the note  LOS: 27 days    Subjective: No SOB, no chest pain, no fever or chills. On IV heparin bridge. Daughter present this PM.  Brief Narrative: 70 y.o. female with past medical history significant for chronic diastolic CHF (in [id number] with EF 55%), hypertension, dyslipidemia, atrial fibrillation on anticoagulation with Coumadin, recently hospitalized 5/6-5/24 of 2017 for pneumonia which was then complicated further by A.Fib RVR which was unstable and required cardioversion. She also had NSTEMI which was managed medically and no cardiac cath due to acute kidney failure. She was discharged to inpatient rehab.   Patient presented to [facility] with worsening shortness of breath over past 2 weeks prior to this admission, or extremity edema. She was seen by primary care physician the week prior to the admission and was instructed to take Lasix twice daily whereas before she was on 40 mg once a day Lasix. There was no significant symptomatic relief for which reason she presented to ED for further evaluation.   Hospital course complicated with worsening renal function and fluid overload for which nephrology was consulted. Patient was eventually moved to ICU and was on CRRT between 6/28 and 6/30 with significant improvement in her fluid status. Transferred to telemetry 6/30. Did not respond to diuresis and was becoming more fluid overload and was started on regular HD 7/3. Tunneled cath laced on 7/5, had left arm fistula creation 7/10. Coumadin started 7/10, PKS dosing warfarin.   Assessment & Plan: Principal Problem:   Acute on chronic diastolic (congestive) heart failure (HCC) Active Problems:   Acute on chronic diastolic CHF (congestive heart failure) (HCC)   Controlled diabetes mellitus type 2 with complications (HCC)   CAD in native artery   Chronic kidney  disease (CKD), stage IV (severe) (HCC)   Essential hypertension   Anemia of chronic kidney failure   Acute on chronic diastolic CHF - Improved with UF. - patient did not respond well initially to diuresis requiring CRRT for 72 h 6/28 - 6/30.  - Weight since 6/23: 109.7 kg --> 109.2 kg --> 108.8 kg --> 109.3 kg --> 108.1 kg >> 104.4 >> 97 kg >> 91 /// CRRT stopped > weight 94 kg >> 96 >> 96.5 until 7/3 when HD started per nephrology. - Did not respond to po Lasix with minimal UOP on 7/1 after CRRT, changed to IV Lasix on 7/2, with that she had suboptimal UOP and was started on HD 7/3. -Cardiology signed off, continue BiDil.  CKD stage 5, now ESRD - Pursue Dialysis Chair. Consider changing IV Heparin to [place] Lovenox once daily for bridging once HD Chair is arranged and patient's INR is still subtherapeutic.  - Creatinine 3 weeks ago 2.75 - Renal following - BUN / Cr / weight continues to climb despite IV Lasix Lasix, had CRRT, did not respond to diuretics afterwards and had her first hemodialysis on 7/3 using temporary IJ catheter. She will undergo again hemodialysis on 7/4.  -Nephrology recommend to continue hemodialysis, HD tunneled catheter placed on 11/29/15 by Dr. Earleen Newport. -Vascular surgery consulted, Fistula creation scheduled initially on 7/7 but postponed to Monday 7/10. -I tried to talk to the vascular surgery PA, I was informed by the nurse in the OR that there is no provider available to do the procedure today or over the weekend, the procedure was postponed due to unforeseeable circumstances. -Left basilic vein  transposition done earlier Dr. Donnetta Hutching 7/10, will need outpt f/u -Resumed the anticoagulation with heparin bridge and Coumadin, PKS dosing coumadin  Hyponatremia - 129>>132 >> 133, improved with HD  Anemia of chronic kidney disease stage 4 - Hemoglobin 7.6 >> 8.3 >> 8.8. Overall stable  Atrial fibrillation  - CHADS vasc score 5 - Recent TEE 09/2015 done for cardioversion -  Continue amiodarone 200 mg daily and metoprolol 25 mg twice daily.  - Coumadin on hold pending tunneled cath, dosing per pharmacy. Now on heparin gtt given INR < 2 - We'll resume heparin and Coumadin, patient will be appropriate for discharge when INR >2.  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  Disposition Plan: Home after INR therapeutic  Consultants:   Nephrology  Cardiology - s/o 6/30  Procedures:   CRRT  HD  Antimicrobials:  None     Objective: Filed Vitals:   12/07/15 1030 12/07/15 1100 12/07/15 1116 12/07/15 1204  BP: 127/57 113/59 150/61 133/81  Pulse: 54 56 57 63  Temp:   98.2 F (36.8 C) 98 F (36.7 C)  TempSrc:   Oral Oral  Resp:   13 18  Height:      Weight:   93 kg (205 lb 0.4 oz)   SpO2:    99%    Intake/Output Summary (Last 24 hours) at 12/07/15 1250 Last data filed at 12/07/15 1116  Gross per 24 hour  Intake    480 ml  Output   4001 ml  Net  -3521 ml   Filed Weights   12/07/15 0428 12/07/15 0700 12/07/15 1116  Weight: 96.9 kg (213 lb 10 oz) 97.3 kg (214 lb 8.1 oz) 93 kg (205 lb 0.4 oz)    Examination: Constitutional: NAD Filed Vitals:   12/07/15 1030 12/07/15 1100 12/07/15 1116 12/07/15 1204  BP: 127/57 113/59 150/61 133/81  Pulse: 54 56 57 63  Temp:   98.2 F (36.8 C) 98 F (36.7 C)  TempSrc:   Oral Oral  Resp:   13 18  Height:      Weight:   93 kg (205 lb 0.4 oz)   SpO2:    99%   Eyes: PERRL, lids and conjunctivae normal Respiratory: clear to auscultation bilaterally, no  wheezing, no crackles. Normal respiratory effort. No accessory muscle use.  Cardiovascular: Regular rate and rhythm, no murmurs / rubs / gallops. Trace LE edema. Abdomen: no tenderness. Bowel sounds positive.  Musculoskeletal: no clubbing / cyanosis.  Neurologic: non focal   Data Reviewed: I have personally reviewed following labs and imaging studies  CBC:  Recent Labs Lab 12/02/15 0333 12/03/15 0927 12/04/15 0401 12/06/15 0054 12/07/15 0523  WBC 8.2 6.6 7.4 6.8 5.8  HGB 9.5* 9.8* 9.3* 8.9* 8.8*  HCT 30.5* 32.5* 29.3* 30.2* 27.9*  MCV 91.0 93.1 90.4 94.1 91.5  PLT 217 182 182 157 [id number]*   Basic Metabolic Panel:  Recent Labs Lab 12/03/15 0927 12/04/15 0401 12/05/15 0314 12/06/15 0054 12/07/15 0523 12/07/15 0524  NA 131* 131* 130* 133*  --  129*  K 4.3 3.9 4.6 4.2  --  4.0  CL 96* 96* 95* 97*  --  93*  CO2 23 23 25 27   --  25  GLUCOSE 250* 123* 88 109*  --  138*  BUN 30* 47* 51* 27*  --  47*  CREATININE 3.27* 3.88* 4.11* 2.81*  --  3.70*  CALCIUM 8.6* 8.7* 8.5* 8.0*  --  8.4*  MG 2.2 2.4 2.5* 2.2 2.3  --   PHOS 3.7 4.9* 6.1* 3.7  --  5.2*   GFR: Estimated Creatinine Clearance: 15.6 mL/min (by C-G formula based on Cr of 3.7). Liver Function Tests:  Recent Labs Lab 12/03/15 0927 12/04/15 0401 12/05/15 0314 12/06/15 0054 12/07/15 0524  ALBUMIN 3.2* 3.1* 3.0* 3.0* 2.9*   Coagulation Profile:  Recent Labs Lab 12/03/15 0927 12/04/15 0401 12/05/15 0314 12/06/15 0054 12/07/15 0523  INR 1.21 1.16 1.19 1.18 1.23   CBG:  Recent Labs Lab 12/06/15 1207 12/06/15 1639 12/06/15 2235 12/07/15 0609 12/07/15 1221  GLUCAP 163* 187* 227* 137* 137*   Anemia Panel: No results for input(s): VITAMINB12, FOLATE, FERRITIN, TIBC, IRON, RETICCTPCT in the last 72 hours. Urine analysis:    Component Value Date/Time   COLORURINE YELLOW 11/10/2015 1521   APPEARANCEUR CLEAR 11/10/2015 1521   LABSPEC 1.013 11/10/2015 1521   PHURINE 5.5 11/10/2015 1521   GLUCOSEU  NEGATIVE 11/10/2015 1521   HGBUR NEGATIVE 11/10/2015 1521   BILIRUBINUR NEGATIVE 11/10/2015 1521   KETONESUR NEGATIVE 11/10/2015 1521   PROTEINUR 100* 11/10/2015 1521   UROBILINOGEN 0.2 03/02/2014 1508   NITRITE NEGATIVE 11/10/2015 1521   LEUKOCYTESUR TRACE* 11/10/2015 1521   Sepsis Labs: Invalid input(s): PROCALCITONIN, LACTICIDVEN  Recent Results (from the past 240 hour(s))  Surgical pcr screen     Status: None   Collection Time: 12/04/15  7:08 AM  Result Value Ref Range Status   MRSA, PCR NEGATIVE NEGATIVE Final   Staphylococcus aureus NEGATIVE NEGATIVE Final    Comment:        The Xpert SA Assay (FDA approved for NASAL specimens in patients over 62 years of age), is one component of a comprehensive surveillance program.  Test performance has been validated by [facility] for patients greater than or equal to 64 year old. It is not intended to diagnose infection nor to guide or monitor treatment.     Radiology Studies: No results found. Scheduled Meds: . amiodarone  200 mg Oral Daily  . amLODipine  5 mg Oral QHS  . atorvastatin  80 mg Oral Daily  . bisacodyl  10 mg Rectal Once  . darbepoetin (ARANESP) injection - DIALYSIS  150 mcg Intravenous Q Thu-HD  . docusate sodium  100 mg Oral BID  . insulin aspart  0-15 Units Subcutaneous TID WC  . insulin aspart  0-5 Units Subcutaneous QHS  . insulin aspart protamine- aspart  15 Units Subcutaneous BID WC  . loratadine  10 mg Oral Daily  . multivitamin  1 tablet Oral QHS  . polyethylene glycol  17 g Oral BID  . protein supplement  1 scoop Oral TID WC  . sevelamer carbonate  800 mg Oral TID WC  . sodium chloride flush  10-40 mL Intracatheter Q12H  . sodium chloride flush  10-40 mL Intracatheter Q12H  . sodium chloride flush  3 mL Intravenous Q12H  . warfarin  10 mg Oral ONCE-1800  . Warfarin - Pharmacist Dosing Inpatient   Does not apply q1800   Continuous Infusions: . sodium chloride 10 mL/hr at 12/04/15 0944  .  heparin 1,400 Units/hr (  12/07/15 1230)    [place] Pager: [phone] 12/07/2015, 12:50 PM   If 7PM-7AM, please contact night-coverage [email or web address] Password [email or web address] 12/07/2015, 12:50 PM

## 2015-12-07 NOTE — Progress Notes (Signed)
[place] for Heparin + Warfarin Indication: atrial fibrillation  Allergies  Allergen Reactions  . Oxycodone Other (See Comments)    hallucinations  . Clonazepam Other (See Comments)    Unknown reaction  . Gemfibrozil Other (See Comments)    Patient is not aware of this allergy but states that she has side effects to a cholesterol medication in the past  . Carbamazepine Other (See Comments)    Reaction to tegretol - loopy  . Macrobid [Nitrofurantoin] Nausea And Vomiting    Patient Measurements: Height: 5\' 4"  (162.6 cm) Weight: 205 lb 0.4 oz (93 kg) (stood to scale ) IBW/kg (Calculated) : 54.7 Heparin Dosing Weight: 76.4 kg  Vital Signs: Temp: 98 F (36.7 C) (07/13 1204) Temp Source: Oral (07/13 1204) BP: 133/81 mmHg (07/13 1204) Pulse Rate: 63 (07/13 1204)  Labs:  Recent Labs  12/05/15 0314  12/06/15 0054 12/06/15 1027 12/07/15 0523 12/07/15 0524 12/07/15 1449  HGB  --   --  8.9*  --  8.8*  --   --   HCT  --   --  30.2*  --  27.9*  --   --   PLT  --   --  157  --  141*  --   --   LABPROT 15.2  --  15.1  --  15.7*  --   --   INR 1.19  --  1.18  --  1.23  --   --   HEPARINUNFRC  --   < > 0.20* 0.41  --  0.28* 0.39  CREATININE 4.11*  --  2.81*  --   --  3.70*  --   < > = values in this interval not displayed.  Estimated Creatinine Clearance: 15.6 mL/min (by C-G formula based on Cr of 3.7).   Assessment: AC/Heme: Warfarin PTA for Hx Afib. CHADS vasc score 5. Heparin gtt started 7/4 when INR <2 for IR placement of tunneled cath 7/5. IR fistula placement 7/10.  --*PTA: 5mg  daily except 2.5mg  on Tuesday and Thursday (last PTA dose 6/15) INR on admit 2.55 Hgb and plt trending down.   8 hr Heparin level = 0.39, therapeutic after heparin rate increased this morning to 1400 units/hr.  oumadin resumed on 7/10.  This morning the INR was subtherapeutic (1.23) with no significant change. Anemia of chronic kidney disease stage 4. -  Hemoglobin 7.6 >> 8.3 >> 8.8. No bleeding noted.   Goal of Therapy:  INR 2-3 Heparin level 0.3-0.7 units/ml Monitor platelets by anticoagulation protocol: Yes   Plan:  Continue heparin to 1400 units/hr Warfarin 10mg  tonight as previously ordered  Daily heparin level, INR and CBC.   Nicole Cella, RPh Clinical Pharmacist Pager: [phone] 12/07/2015 4:00 PM

## 2015-12-07 NOTE — Procedures (Signed)
I was present at this dialysis session. I have reviewed the session itself and made appropriate changes.   Doing well.  On hep gtt awaiting therapeutic INR.  No new issues. TDC.  Next HD 7/15 if INR not therapeutic.   Filed Weights   12/06/15 0402 12/07/15 0428 12/07/15 0700  Weight: 94.892 kg (209 lb 3.2 oz) 96.9 kg (213 lb 10 oz) 97.3 kg (214 lb 8.1 oz)     Recent Labs Lab 12/07/15 0524  NA 129*  K 4.0  CL 93*  CO2 25  GLUCOSE 138*  BUN 47*  CREATININE 3.70*  CALCIUM 8.4*  PHOS 5.2*     Recent Labs Lab 12/04/15 0401 12/06/15 0054 12/07/15 0523  WBC 7.4 6.8 5.8  HGB 9.3* 8.9* 8.8*  HCT 29.3* 30.2* 27.9*  MCV 90.4 94.1 91.5  PLT 182 157 141*    Scheduled Meds: . amiodarone  200 mg Oral Daily  . amLODipine  5 mg Oral QHS  . atorvastatin  80 mg Oral Daily  . bisacodyl  10 mg Rectal Once  . darbepoetin (ARANESP) injection - DIALYSIS  150 mcg Intravenous Q Thu-HD  . docusate sodium  100 mg Oral BID  . insulin aspart  0-15 Units Subcutaneous TID WC  . insulin aspart  0-5 Units Subcutaneous QHS  . insulin aspart protamine- aspart  15 Units Subcutaneous BID WC  . loratadine  10 mg Oral Daily  . multivitamin  1 tablet Oral QHS  . polyethylene glycol  17 g Oral BID  . protein supplement  1 scoop Oral TID WC  . sevelamer carbonate  800 mg Oral TID WC  . sodium chloride flush  10-40 mL Intracatheter Q12H  . sodium chloride flush  10-40 mL Intracatheter Q12H  . sodium chloride flush  3 mL Intravenous Q12H  . warfarin  10 mg Oral ONCE-1800  . Warfarin - Pharmacist Dosing Inpatient   Does not apply q1800   Continuous Infusions: . sodium chloride 10 mL/hr at 12/04/15 0944  . heparin 1,400 Units/hr (12/07/15 0645)   PRN Meds:.sodium chloride, acetaminophen, diazepam, heparin, HYDROcodone-acetaminophen, hydrocortisone, ondansetron (ZOFRAN) IV, sodium chloride flush, sodium chloride flush, sodium chloride flush   Briana Grippe  MD 12/07/2015, 8:53 AM

## 2015-12-07 NOTE — Progress Notes (Signed)
[place] for Heparin + Warfarin Indication: atrial fibrillation  Allergies  Allergen Reactions  . Oxycodone Other (See Comments)    hallucinations  . Clonazepam Other (See Comments)    Unknown reaction  . Gemfibrozil Other (See Comments)    Patient is not aware of this allergy but states that she has side effects to a cholesterol medication in the past  . Carbamazepine Other (See Comments)    Reaction to tegretol - loopy  . Macrobid [Nitrofurantoin] Nausea And Vomiting    Patient Measurements: Height: 5\' 4"  (162.6 cm) Weight: 213 lb 10 oz (96.9 kg) (Scale C) IBW/kg (Calculated) : 54.7 Heparin Dosing Weight: 76.4 kg  Vital Signs: Temp: 98.4 F (36.9 C) (07/13 0428) Temp Source: Oral (07/13 0428) BP: 123/32 mmHg (07/13 0428) Pulse Rate: 51 (07/13 0428)  Labs:  Recent Labs  12/05/15 0314 12/06/15 0054 12/06/15 1027 12/07/15 0523 12/07/15 0524  HGB  --  8.9*  --  8.8*  --   HCT  --  30.2*  --  27.9*  --   PLT  --  157  --  141*  --   LABPROT 15.2 15.1  --  15.7*  --   INR 1.19 1.18  --  1.23  --   HEPARINUNFRC  --  0.20* 0.41  --  0.28*  CREATININE 4.11* 2.81*  --   --  3.70*    Estimated Creatinine Clearance: 16 mL/min (by C-G formula based on Cr of 3.7).   Assessment: AC/Heme: Warfarin PTA for Hx Afib. CHADS vasc score 5. Heparin gtt started 7/4 when INR <2 for IR placement of tunneled cath 7/5. IR fistula placement 7/10.  --*PTA: 5mg  daily except 2.5mg  on Tuesday and Thursday (last PTA dose 6/15) INR on admit 2.55 Hgb and plt trending down Heparin level down to slightly subtherapeutic on 1300 units/hr. INR continues to be subtherapeutic (1.23) with no significant change  Goal of Therapy:  INR 2-3 Heparin level 0.3-0.7 units/ml Monitor platelets by anticoagulation protocol: Yes   Plan:  Increase heparin to 1400 units/hr Warfarin 10mg  again tonight F/u heparin level in 8 hrs  Sherlon Handing, PharmD, BCPS Clinical  pharmacist, pager [phone] 12/07/2015 6:15 AM

## 2015-12-08 LAB — CBC
HEMATOCRIT: 30.5 % — AB (ref 36.0–46.0)
Hemoglobin: 9.4 g/dL — ABNORMAL LOW (ref 12.0–15.0)
MCH: 28.6 pg (ref 26.0–34.0)
MCHC: 30.8 g/dL (ref 30.0–36.0)
MCV: 92.7 fL (ref 78.0–100.0)
Platelets: 161 10*3/uL (ref 150–400)
RBC: 3.29 MIL/uL — ABNORMAL LOW (ref 3.87–5.11)
RDW: 17.7 % — AB (ref 11.5–15.5)
WBC: 6.7 10*3/uL (ref 4.0–10.5)

## 2015-12-08 LAB — PROTIME-INR
INR: 1.37 (ref 0.00–1.49)
INR: 1.41 (ref 0.00–1.49)
PROTHROMBIN TIME: 17 s — AB (ref 11.6–15.2)
Prothrombin Time: 17.4 seconds — ABNORMAL HIGH (ref 11.6–15.2)

## 2015-12-08 LAB — RENAL FUNCTION PANEL
ALBUMIN: 2.9 g/dL — AB (ref 3.5–5.0)
Anion gap: 11 (ref 5–15)
BUN: 26 mg/dL — AB (ref 6–20)
CHLORIDE: 94 mmol/L — AB (ref 101–111)
CO2: 26 mmol/L (ref 22–32)
CREATININE: 3.09 mg/dL — AB (ref 0.44–1.00)
Calcium: 8.5 mg/dL — ABNORMAL LOW (ref 8.9–10.3)
GFR calc Af Amer: 17 mL/min — ABNORMAL LOW (ref >60)
GFR, EST NON AFRICAN AMERICAN: 14 mL/min — AB (ref >60)
GLUCOSE: 102 mg/dL — AB (ref 65–99)
POTASSIUM: 4.2 mmol/L (ref 3.5–5.1)
Phosphorus: 3.6 mg/dL (ref 2.5–4.6)
Sodium: 131 mmol/L — ABNORMAL LOW (ref 135–145)

## 2015-12-08 LAB — MAGNESIUM: Magnesium: 2.1 mg/dL (ref 1.7–2.4)

## 2015-12-08 LAB — GLUCOSE, CAPILLARY
GLUCOSE-CAPILLARY: 119 mg/dL — AB (ref 65–99)
GLUCOSE-CAPILLARY: 165 mg/dL — AB (ref 65–99)
GLUCOSE-CAPILLARY: 183 mg/dL — AB (ref 65–99)
Glucose-Capillary: 177 mg/dL — ABNORMAL HIGH (ref 65–99)

## 2015-12-08 LAB — HEPARIN LEVEL (UNFRACTIONATED)
Heparin Unfractionated: 0.17 IU/mL — ABNORMAL LOW (ref 0.30–0.70)
Heparin Unfractionated: 0.51 IU/mL (ref 0.30–0.70)

## 2015-12-08 MED ORDER — WARFARIN SODIUM 7.5 MG PO TABS
7.5000 mg | ORAL_TABLET | Freq: Once | ORAL | Status: AC
Start: 2015-12-08 — End: 2015-12-08
  Administered 2015-12-08: 7.5 mg via ORAL
  Filled 2015-12-08: qty 1

## 2015-12-08 MED ORDER — HEPARIN BOLUS VIA INFUSION
2000.0000 [IU] | Freq: Once | INTRAVENOUS | Status: AC
  Administered 2015-12-08: 2000 [IU] via INTRAVENOUS
  Filled 2015-12-08: qty 2000

## 2015-12-08 NOTE — Progress Notes (Signed)
Patient ID: Briana [name], female   DOB: [birth year], 70 y.o.   MRN: [id number] S: Feels well no complaints O:BP 168/45 mmHg  Pulse 56  Temp(Src) 98 F (36.7 C) (Oral)  Resp 18  Ht 5\' 4"  (1.626 m)  Wt 94.167 kg (207 lb 9.6 oz)  BMI 35.62 kg/m2  SpO2 98%  Intake/Output Summary (Last 24 hours) at 12/08/15 0912 Last data filed at 12/08/15 0600  Gross per 24 hour  Intake 739.55 ml  Output   4001 ml  Net -3261.45 ml   Intake/Output: I/O last 3 completed shifts: In: 739.6 [P.O.:480; I.V.:259.6] Out: 4001 [Other:4001]  Intake/Output this shift:    Weight change: -3.9 kg (-8 lb 9.6 oz) Gen:WD WN obese WF in NAd CVS:no rub Resp:cta [id number] Ext:no edema, LAVF +T/B   Recent Labs Lab 12/02/15 0435 12/03/15 0927 12/04/15 0401 12/05/15 0314 12/06/15 0054 12/07/15 0524 12/08/15 0319  NA 134* 131* 131* 130* 133* 129* 131*  K 4.1 4.3 3.9 4.6 4.2 4.0 4.2  CL 98* 96* 96* 95* 97* 93* 94*  CO2 23 23 23 25 27 25 26   GLUCOSE 101* 250* 123* 88 109* 138* 102*  BUN 43* 30* 47* 51* 27* 47* 26*  CREATININE 3.56* 3.27* 3.88* 4.11* 2.81* 3.70* 3.09*  ALBUMIN 3.3* 3.2* 3.1* 3.0* 3.0* 2.9* 2.9*  CALCIUM 8.6* 8.6* 8.7* 8.5* 8.0* 8.4* 8.5*  PHOS 4.3 3.7 4.9* 6.1* 3.7 5.2* 3.6   Liver Function Tests:  Recent Labs Lab 12/06/15 0054 12/07/15 0524 12/08/15 0319  ALBUMIN 3.0* 2.9* 2.9*   No results for input(s): LIPASE, AMYLASE in the last 168 hours. No results for input(s): AMMONIA in the last 168 hours. CBC:  Recent Labs Lab 12/03/15 0927 12/04/15 0401 12/06/15 0054 12/07/15 0523 12/08/15 0319  WBC 6.6 7.4 6.8 5.8 6.7  HGB 9.8* 9.3* 8.9* 8.8* 9.4*  HCT 32.5* 29.3* 30.2* 27.9* 30.5*  MCV 93.1 90.4 94.1 91.5 92.7  PLT 182 182 157 141* 161   Cardiac Enzymes: No results for input(s): CKTOTAL, CKMB, CKMBINDEX, TROPONINI in the last 168 hours. CBG:  Recent Labs Lab 12/07/15 0609 12/07/15 1221 12/07/15 1631 12/07/15 2148 12/08/15 0633  GLUCAP 137* 137* 176* 143*  119*    Iron Studies: No results for input(s): IRON, TIBC, TRANSFERRIN, FERRITIN in the last 72 hours. Studies/Results: No results found. Marland Kitchen amiodarone  200 mg Oral Daily  . amLODipine  5 mg Oral QHS  . atorvastatin  80 mg Oral Daily  . bisacodyl  10 mg Rectal Once  . darbepoetin (ARANESP) injection - DIALYSIS  150 mcg Intravenous Q Thu-HD  . docusate sodium  100 mg Oral BID  . insulin aspart  0-15 Units Subcutaneous TID WC  . insulin aspart  0-5 Units Subcutaneous QHS  . insulin aspart protamine- aspart  15 Units Subcutaneous BID WC  . loratadine  10 mg Oral Daily  . multivitamin  1 tablet Oral QHS  . polyethylene glycol  17 g Oral BID  . protein supplement  1 scoop Oral TID WC  . sevelamer carbonate  800 mg Oral TID WC  . sodium chloride flush  10-40 mL Intracatheter Q12H  . sodium chloride flush  10-40 mL Intracatheter Q12H  . sodium chloride flush  3 mL Intravenous Q12H  . Warfarin - Pharmacist Dosing Inpatient   Does not apply q1800    BMET    Component Value Date/Time   NA 131* 12/08/2015 0319   K 4.2 12/08/2015 0319   CL 94* 12/08/2015 0319  CO2 26 12/08/2015 0319   GLUCOSE 102* 12/08/2015 0319   BUN 26* 12/08/2015 0319   CREATININE 3.09* 12/08/2015 0319   CREATININE 2.37* 09/15/2015 1014   CALCIUM 8.5* 12/08/2015 0319   GFRNONAA 14* 12/08/2015 0319   GFRAA 17* 12/08/2015 0319   CBC    Component Value Date/Time   WBC 6.7 12/08/2015 0319   RBC 3.29* 12/08/2015 0319   HGB 9.4* 12/08/2015 0319   HCT 30.5* 12/08/2015 0319   PLT 161 12/08/2015 0319   MCV 92.7 12/08/2015 0319   MCH 28.6 12/08/2015 0319   MCHC 30.8 12/08/2015 0319   RDW 17.7* 12/08/2015 0319   LYMPHSABS 1.7 11/10/2015 1647   MONOABS 0.6 11/10/2015 1647   EOSABS 0.3 11/10/2015 1647   BASOSABS 0.0 11/10/2015 1647    Assessment/Plan:  1. New ESRD- currently on TTS schedule and has been accepted at [facility] for second shift 2. Acute decompensation of diastolic CHF- improved with initiation of  HD and ultrafiltration. 3. Anemia of chronic kidney disease- on ESA and s/p IV Iron 4. CKD-MBD- phos controlled, cont with sevelamer, no vitamin D due to low iPTH at 87 5. Nutrition- low albumin/protein malnutrition related to ESRD.  On supplements 6. Vascular access- L BVT AVF placed 12/04/15 by Dr. Donnetta Hutching, +t/b, tunneled HD cath in place 7. HTN- not at goal.  Recommend increase amlodipine to 10mg  qhs and challenge edw with HD 8. Disposition- pt is set up for outpatient HD in [place] when stable for discharge.  [place]

## 2015-12-08 NOTE — Progress Notes (Signed)
PROGRESS NOTE  Briana [name] [id number] DOB: 07/13/1945 DOA: 11/10/2015 PCP: Antionette Fairy, PA-C  Late entry for the note  LOS: 28 days    Subjective: No SOB, no chest pain, no fever or chills. On IV heparin bridge. Daughter not present this AM.  Brief Narrative: 70 y.o. female with past medical history significant for chronic diastolic CHF (in [id number] with EF 55%), hypertension, dyslipidemia, atrial fibrillation on anticoagulation with Coumadin, recently hospitalized 5/6-5/24 of 2017 for pneumonia which was then complicated further by A.Fib RVR which was unstable and required cardioversion. She also had NSTEMI which was managed medically and no cardiac cath due to acute kidney failure. She was discharged to inpatient rehab.   Patient presented to [facility] with worsening shortness of breath over past 2 weeks prior to this admission, or extremity edema. She was seen by primary care physician the week prior to the admission and was instructed to take Lasix twice daily whereas before she was on 40 mg once a day Lasix. There was no significant symptomatic relief for which reason she presented to ED for further evaluation.   Hospital course complicated with worsening renal function and fluid overload for which nephrology was consulted. Patient was eventually moved to ICU and was on CRRT between 6/28 and 6/30 with significant improvement in her fluid status. Transferred to telemetry 6/30. Did not respond to diuresis and was becoming more fluid overload and was started on regular HD 7/3. Tunneled cath laced on 7/5, had left arm fistula creation 7/10. Coumadin started 7/10, PKS dosing warfarin.   Assessment & Plan: Principal Problem:   Acute on chronic diastolic (congestive) heart failure (HCC) Active Problems:   Acute on chronic diastolic CHF (congestive heart failure) (HCC)   Controlled diabetes mellitus type 2 with complications (HCC)   CAD in native artery   Chronic kidney  disease (CKD), stage IV (severe) (HCC)   Essential hypertension   Anemia of chronic kidney failure   Acute on chronic diastolic CHF - Improved with UF. - patient did not respond well initially to diuresis requiring CRRT for 72 h 6/28 - 6/30.  - Weight since 6/23: 109.7 kg --> 109.2 kg --> 108.8 kg --> 109.3 kg --> 108.1 kg >> 104.4 >> 97 kg >> 91 /// CRRT stopped > weight 94 kg >> 96 >> 96.5 until 7/3 when HD started per nephrology. - Did not respond to po Lasix with minimal UOP on 7/1 after CRRT, changed to IV Lasix on 7/2, with that she had suboptimal UOP and was started on HD 7/3. -Cardiology signed off, continue BiDil.  CKD stage 5, now ESRD - Pursue Dialysis Chair. Consider changing IV Heparin to [place] Lovenox once daily for bridging once HD Chair is arranged and patient's INR is still subtherapeutic.  - Creatinine 3 weeks ago 2.75 - Renal following - BUN / Cr / weight continues to climb despite IV Lasix Lasix, had CRRT, did not respond to diuretics afterwards and had her first hemodialysis on 7/3 using temporary IJ catheter. She will undergo again hemodialysis on 7/4.  -Nephrology recommend to continue hemodialysis, HD tunneled catheter placed on 11/29/15 by Dr. Earleen Newport. -Vascular surgery consulted, Fistula creation scheduled initially on 7/7 but postponed to Monday 0000000. -Left basilic vein transposition done earlier Dr. Donnetta Hutching 7/10, will need outpt f/u -Resumed the anticoagulation with heparin bridge and Coumadin, PKS dosing coumadin  Hyponatremia - 129>>132 >> 133, improved with HD  Anemia of chronic kidney disease stage 4 - Hemoglobin  7.6 >> 8.3 >> 8.8. Overall stable  Atrial fibrillation  - CHADS vasc score 5 - Recent TEE 09/2015 done for cardioversion - Continue amiodarone 200 mg daily and metoprolol 25 mg twice daily.  - Coumadin on hold pending tunneled cath, dosing per pharmacy. Now on heparin gtt given INR < 2 - We'll resume heparin and Coumadin, patient will be appropriate  for discharge when INR >2.  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  Disposition Plan: Home after INR therapeutic  Consultants:   Nephrology  Cardiology - s/o 6/30  Procedures:   CRRT  HD  Antimicrobials:  None     Objective: Filed Vitals:   12/07/15 1204 12/07/15 2100 12/08/15 0248 12/08/15 0445  BP: 133/81 98/38  168/45  Pulse: 63 61  56  Temp: 98 F (36.7 C) 98.7 F (37.1 C)  98 F (36.7 C)  TempSrc: Oral Oral  Oral  Resp: 18 18  18   Height:      Weight:   94.167 kg (207 lb 9.6 oz)   SpO2: 99% 99%  98%    Intake/Output Summary (Last 24 hours) at 12/08/15 1204 Last data filed at 12/08/15 0600  Gross per 24 hour  Intake 739.55 ml  Output      0 ml  Net 739.55 ml   Filed Weights   12/07/15 0700 12/07/15 1116 12/08/15 0248  Weight: 97.3 kg (214 lb 8.1 oz) 93 kg (205 lb 0.4 oz) 94.167 kg (207 lb 9.6 oz)    Examination: Constitutional: NAD Filed Vitals:   12/07/15 1204 12/07/15 2100 12/08/15 0248 12/08/15 0445  BP: 133/81 98/38  168/45  Pulse: 63 61  56  Temp: 98 F (36.7 C) 98.7 F (37.1 C)  98 F (36.7 C)  TempSrc: Oral Oral  Oral  Resp: 18 18  18   Height:      Weight:   94.167 kg (207 lb 9.6 oz)   SpO2: 99% 99%  98%   Eyes: PERRL, lids and conjunctivae normal Respiratory: clear to auscultation bilaterally, no wheezing, no crackles. Normal respiratory effort. No accessory muscle use.  Cardiovascular: Regular rate and rhythm, no murmurs / rubs / gallops. Trace LE  edema. Abdomen: no tenderness. Bowel sounds positive.  Musculoskeletal: no clubbing / cyanosis.  Neurologic: non focal   Data Reviewed: I have personally reviewed following labs and imaging studies  CBC:  Recent Labs Lab 12/03/15 0927 12/04/15 0401 12/06/15 0054 12/07/15 0523 12/08/15 0319  WBC 6.6 7.4 6.8 5.8 6.7  HGB 9.8* 9.3* 8.9* 8.8* 9.4*  HCT 32.5* 29.3* 30.2* 27.9* 30.5*  MCV 93.1 90.4 94.1 91.5 92.7  PLT 182 182 157 141* [id number]   Basic Metabolic Panel:  Recent Labs Lab 12/04/15 0401 12/05/15 0314 12/06/15 0054 12/07/15 0523 12/07/15 0524 12/08/15 0319  NA 131* 130* 133*  --  129* 131*  K 3.9 4.6 4.2  --  4.0 4.2  CL 96* 95* 97*  --  93* 94*  CO2 23 25 27   --  25 26  GLUCOSE 123* 88 109*  --  138* 102*  BUN 47* 51* 27*  --  47* 26*  CREATININE 3.88* 4.11* 2.81*  --  3.70* 3.09*  CALCIUM 8.7* 8.5* 8.0*  --  8.4* 8.5*  MG 2.4 2.5* 2.2 2.3  --  2.1  PHOS 4.9* 6.1* 3.7  --  5.2* 3.6   GFR: Estimated Creatinine Clearance: 18.9 mL/min (by C-G formula based on Cr of 3.09). Liver Function Tests:  Recent Labs Lab 12/04/15 0401 12/05/15 0314 12/06/15 0054 12/07/15 0524 12/08/15 0319  ALBUMIN 3.1* 3.0* 3.0* 2.9* 2.9*   Coagulation Profile:  Recent Labs Lab 12/04/15 0401 12/05/15 0314 12/06/15 0054 12/07/15 0523 12/08/15 0319  INR 1.16 1.19 1.18 1.23 1.37   CBG:  Recent Labs Lab 12/07/15 1221 12/07/15 1631 12/07/15 2148 12/08/15 0633 12/08/15 1116  GLUCAP 137* 176* 143* 119* 165*   Anemia Panel: No results for input(s): VITAMINB12, FOLATE, FERRITIN, TIBC, IRON, RETICCTPCT in the last 72 hours. Urine analysis:    Component Value Date/Time   COLORURINE YELLOW 11/10/2015 1521   APPEARANCEUR CLEAR 11/10/2015 1521   LABSPEC 1.013 11/10/2015 1521   PHURINE 5.5 11/10/2015 1521   GLUCOSEU NEGATIVE 11/10/2015 1521   HGBUR NEGATIVE 11/10/2015 1521   BILIRUBINUR NEGATIVE 11/10/2015 1521   KETONESUR NEGATIVE 11/10/2015 1521   PROTEINUR 100*  11/10/2015 1521   UROBILINOGEN 0.2 03/02/2014 1508   NITRITE NEGATIVE 11/10/2015 1521   LEUKOCYTESUR TRACE* 11/10/2015 1521   Sepsis Labs: Invalid input(s): PROCALCITONIN, LACTICIDVEN  Recent Results (from the past 240 hour(s))  Surgical pcr screen     Status: None   Collection Time: 12/04/15  7:08 AM  Result Value Ref Range Status   MRSA, PCR NEGATIVE NEGATIVE Final   Staphylococcus aureus NEGATIVE NEGATIVE Final    Comment:        The Xpert SA Assay (FDA approved for NASAL specimens in patients over 3 years of age), is one component of a comprehensive surveillance program.  Test performance has been validated by [facility] for patients greater than or equal to 86 year old. It is not intended to diagnose infection nor to guide or monitor treatment.     Radiology Studies: No results found. Scheduled Meds: . amiodarone  200 mg Oral Daily  . amLODipine  5 mg Oral QHS  . atorvastatin  80 mg Oral Daily  . bisacodyl  10 mg Rectal Once  . darbepoetin (ARANESP) injection - DIALYSIS  150 mcg Intravenous Q Thu-HD  . docusate sodium  100 mg Oral BID  . insulin aspart  0-15 Units Subcutaneous TID WC  . insulin aspart  0-5 Units Subcutaneous QHS  . insulin aspart protamine- aspart  15 Units Subcutaneous BID WC  . loratadine  10 mg Oral Daily  . multivitamin  1 tablet Oral QHS  . polyethylene glycol  17 g Oral BID  . protein supplement  1 scoop Oral TID WC  . sevelamer carbonate  800 mg Oral TID WC  . sodium chloride flush  10-40 mL Intracatheter Q12H  . sodium chloride flush  10-40 mL Intracatheter Q12H  . sodium chloride flush  3 mL Intravenous Q12H  . Warfarin - Pharmacist Dosing Inpatient   Does not apply q1800   Continuous Infusions: . sodium chloride 10 mL/hr at 12/04/15 0944  . heparin 1,700 Units/hr (12/08/15 0549)    Rae Lips A Elmahi Pager: [phone] 12/08/2015, 12:04 PM   If 7PM-7AM, please contact night-coverage [email or web address] Password [facility] 12/08/2015,  12:04 PM

## 2015-12-08 NOTE — Progress Notes (Signed)
[place] for Heparin + Warfarin Indication: atrial fibrillation  Allergies  Allergen Reactions  . Oxycodone Other (See Comments)    hallucinations  . Clonazepam Other (See Comments)    Unknown reaction  . Gemfibrozil Other (See Comments)    Patient is not aware of this allergy but states that she has side effects to a cholesterol medication in the past  . Carbamazepine Other (See Comments)    Reaction to tegretol - loopy  . Macrobid [Nitrofurantoin] Nausea And Vomiting    Patient Measurements: Height: 5\' 4"  (162.6 cm) Weight: 207 lb 9.6 oz (94.167 kg) IBW/kg (Calculated) : 54.7 Heparin Dosing Weight: 76.4 kg  Vital Signs: Temp: 97.5 F (36.4 C) (07/14 1500) Temp Source: Oral (07/14 1500) BP: 145/33 mmHg (07/14 1500) Pulse Rate: 54 (07/14 1500)  Labs:  Recent Labs  12/06/15 0054  12/07/15 0523 12/07/15 0524 12/07/15 1449 12/08/15 0319 12/08/15 0322 12/08/15 1431  HGB 8.9*  --  8.8*  --   --  9.4*  --   --   HCT 30.2*  --  27.9*  --   --  30.5*  --   --   PLT 157  --  141*  --   --  161  --   --   LABPROT 15.1  --  15.7*  --   --  17.0*  --  17.4*  INR 1.18  --  1.23  --   --  1.37  --  1.41  HEPARINUNFRC 0.20*  < >  --  0.28* 0.39  --  0.17* 0.51  CREATININE 2.81*  --   --  3.70*  --  3.09*  --   --   < > = values in this interval not displayed.  Estimated Creatinine Clearance: 18.9 mL/min (by C-G formula based on Cr of 3.09).   Assessment: AC/Heme: Warfarin PTA for Hx Afib. CHADS vasc score 5. Heparin gtt started 7/4 when INR <2 for IR placement of tunneled cath 7/5. IR fistula placement 7/10. Coumadin resumed on 12/04/15.  --*PTA: 5mg  daily except 2.5mg  on Tuesday and Thursday (last PTA dose 6/15) INR on admit 2.55 INR today is 1.41  (INR trend:  1.16>1.19>.23 >1.37>1.41) 8h HL 0.51 after rebolus of heparin 2000 units x1 & rate increased to 1700 u/hr early this AM.  No bleeding noted.  -on amiod 200mg  daily pta/resumed -  Anemia of chronic kidney disease stage 4. - Hemoglobin 7.6 >> 8.3 >> 8.8>9.4. PLTC wnl 161k. No bleeding noted. Chronic kidney disease stage IV , now ESRD, HD done on 7/13..  Goal of Therapy:  INR 2-3 Heparin level 0.3-0.7 units/ml Monitor platelets by anticoagulation protocol: Yes   Plan:  Continue heparin to 1700 units/hr Warfarin 7.5mg  tonight as previously ordered  Daily heparin level, INR and CBC.   Nicole Cella, RPh Clinical Pharmacist Pager: [phone] 12/08/2015 5:10 PM

## 2015-12-08 NOTE — Progress Notes (Signed)
Physical Therapy Treatment Patient Details Name: Briana [name] MRN: [id number] DOB: 11-03-1945 Today's Date: 12/08/2015    History of Present Illness Pt is a 70 y/o F who was just admitted s/p recent hospital stay for PNA,  CHF exacerbation,  A.Fib RVR, ARF and NSTEMI.  Pt presents this admission w/ c/o SOB, peripheral edema, and DOE.  Admitting Dx: acute on chronic diastolic CHF.  Pt's PMH includes PVD, depression, anxiety, neuropathy, a-fib.    PT Comments    Patient progressing with balance and feels more secure with rollator due to having place to rest if needed.  States her daughter wouldn't be able to get w/c out of car easily so prefers rollator.  RN case manager aware. Continue skilled PT until d/c.  Follow Up Recommendations  Home health PT;Supervision - Intermittent     Equipment Recommendations  Other (comment) (rollator)    Recommendations for Other Services       Precautions / Restrictions Precautions Precautions: Fall    Mobility  Bed Mobility               General bed mobility comments: up when PT arrived  Transfers Overall transfer level: Modified independent Equipment used: 4-wheeled walker Transfers: Sit to/from Stand              Ambulation/Gait Ambulation/Gait assistance: Supervision Ambulation Distance (Feet): 250 Feet Assistive device: 4-wheeled walker Gait Pattern/deviations: Step-through pattern;Decreased stride length     General Gait Details: practiced with rollator and locking it and sitting on it with cues, reports she would rather have rollator than w/c   Stairs            Wheelchair Mobility    Modified Rankin (Stroke Patients Only)       Balance     Sitting balance-Leahy Scale: Good       Standing balance-Leahy Scale: Fair                      Cognition Arousal/Alertness: Awake/alert Behavior During Therapy: WFL for tasks assessed/performed Overall Cognitive Status: Within Functional  Limits for tasks assessed                      Exercises      General Comments        Pertinent Vitals/Pain Faces Pain Scale: Hurts little more Pain Location: feet (chronic) and L arm Pain Descriptors / Indicators: Discomfort;Sore Pain Intervention(s): Monitored during session    Home Living                      Prior Function            PT Goals (current goals can now be found in the care plan section) Acute Rehab PT Goals Patient Stated Goal: To go home Time For Goal Achievement: 12/15/15 Progress towards PT goals: Progressing toward goals    Frequency  Min 3X/week    PT Plan Current plan remains appropriate;Equipment recommendations need to be updated    Co-evaluation             End of Session   Activity Tolerance: Patient tolerated treatment well Patient left: in chair;with call bell/phone within reach     Time: 1007-1023 PT Time Calculation (min) (ACUTE ONLY): 16 min  Charges:  $Gait Training: 8-22 mins                    G Codes:  Reginia Naas 12/08/2015, 12:36 PM  Magda Kiel, [place] 12/08/2015

## 2015-12-08 NOTE — Progress Notes (Signed)
ANTICOAGULATION CONSULT NOTE - Follow Up Consult  Pharmacy Consult for heparin Indication: atrial fibrillation  Labs:  Recent Labs  12/06/15 0054  12/07/15 0523 12/07/15 0524 12/07/15 1449 12/08/15 0319 12/08/15 0322  HGB 8.9*  --  8.8*  --   --  9.4*  --   HCT 30.2*  --  27.9*  --   --  30.5*  --   PLT 157  --  141*  --   --  161  --   LABPROT 15.1  --  15.7*  --   --   --   --   INR 1.18  --  1.23  --   --   --   --   HEPARINUNFRC 0.20*  < >  --  0.28* 0.39  --  0.17*  CREATININE 2.81*  --   --  3.70*  --   --   --   < > = values in this interval not displayed.   Assessment: 70yo female now subtherapeutic on heparin after one level at lower end of goal; no gtt issues overnight per RN.  Goal of Therapy:  Heparin level 0.3-0.7 units/ml   Plan:  Will rebolus with heparin 2000 units and increase gtt by 3 units/kg/hr to 1700 units/hr and check level in [place], PharmD, BCPS  12/08/2015,5:43 AM

## 2015-12-08 NOTE — Care Management Important Message (Signed)
Important Message  Patient Details  Name: Briana [name] MRN: [id number] Date of Birth: [birth year]   Medicare Important Message Given:  Yes    Nathen May 12/08/2015, 11:38 AM

## 2015-12-09 DIAGNOSIS — Z794 Long term (current) use of insulin: Secondary | ICD-10-CM

## 2015-12-09 DIAGNOSIS — E118 Type 2 diabetes mellitus with unspecified complications: Secondary | ICD-10-CM

## 2015-12-09 DIAGNOSIS — N185 Chronic kidney disease, stage 5: Secondary | ICD-10-CM

## 2015-12-09 DIAGNOSIS — D631 Anemia in chronic kidney disease: Secondary | ICD-10-CM

## 2015-12-09 DIAGNOSIS — I2510 Atherosclerotic heart disease of native coronary artery without angina pectoris: Secondary | ICD-10-CM

## 2015-12-09 LAB — GLUCOSE, CAPILLARY
GLUCOSE-CAPILLARY: 207 mg/dL — AB (ref 65–99)
GLUCOSE-CAPILLARY: 204 mg/dL — AB (ref 65–99)
Glucose-Capillary: 146 mg/dL — ABNORMAL HIGH (ref 65–99)
Glucose-Capillary: 115 mg/dL — ABNORMAL HIGH (ref 65–99)

## 2015-12-09 LAB — RENAL FUNCTION PANEL
ANION GAP: 13 (ref 5–15)
Albumin: 3 g/dL — ABNORMAL LOW (ref 3.5–5.0)
BUN: 46 mg/dL — ABNORMAL HIGH (ref 6–20)
CO2: 25 mmol/L (ref 22–32)
Calcium: 9.1 mg/dL (ref 8.9–10.3)
Chloride: 94 mmol/L — ABNORMAL LOW (ref 101–111)
Creatinine, Ser: 3.75 mg/dL — ABNORMAL HIGH (ref 0.44–1.00)
GFR calc Af Amer: 13 mL/min — ABNORMAL LOW (ref >60)
GFR calc non Af Amer: 11 mL/min — ABNORMAL LOW (ref >60)
GLUCOSE: 124 mg/dL — AB (ref 65–99)
POTASSIUM: 4.8 mmol/L (ref 3.5–5.1)
Phosphorus: 4.6 mg/dL (ref 2.5–4.6)
SODIUM: 132 mmol/L — AB (ref 135–145)

## 2015-12-09 LAB — CBC
HCT: 31.4 % — ABNORMAL LOW (ref 36.0–46.0)
Hemoglobin: 9.9 g/dL — ABNORMAL LOW (ref 12.0–15.0)
MCH: 28.9 pg (ref 26.0–34.0)
MCHC: 31.5 g/dL (ref 30.0–36.0)
MCV: 91.5 fL (ref 78.0–100.0)
PLATELETS: 188 10*3/uL (ref 150–400)
RBC: 3.43 MIL/uL — AB (ref 3.87–5.11)
RDW: 17.6 % — ABNORMAL HIGH (ref 11.5–15.5)
WBC: 7.5 10*3/uL (ref 4.0–10.5)

## 2015-12-09 LAB — MAGNESIUM: Magnesium: 2.3 mg/dL (ref 1.7–2.4)

## 2015-12-09 LAB — HEPARIN LEVEL (UNFRACTIONATED): HEPARIN UNFRACTIONATED: 0.52 [IU]/mL (ref 0.30–0.70)

## 2015-12-09 LAB — PROTIME-INR
INR: 1.67 — ABNORMAL HIGH (ref 0.00–1.49)
Prothrombin Time: 19.7 seconds — ABNORMAL HIGH (ref 11.6–15.2)

## 2015-12-09 MED ORDER — AMLODIPINE BESYLATE 10 MG PO TABS
10.0000 mg | ORAL_TABLET | Freq: Every day | ORAL | Status: DC
  Administered 2015-12-09: 10 mg via ORAL
  Filled 2015-12-09 (×2): qty 1

## 2015-12-09 MED ORDER — ROPINIROLE HCL 0.25 MG PO TABS
0.2500 mg | ORAL_TABLET | Freq: Every day | ORAL | Status: DC
  Administered 2015-12-09: 0.25 mg via ORAL
  Filled 2015-12-09: qty 1

## 2015-12-09 MED ORDER — WARFARIN SODIUM 10 MG PO TABS
10.0000 mg | ORAL_TABLET | Freq: Once | ORAL | Status: AC
  Administered 2015-12-09: 10 mg via ORAL
  Filled 2015-12-09: qty 1

## 2015-12-09 MED ORDER — HEPARIN SODIUM (PORCINE) 1000 UNIT/ML DIALYSIS
20.0000 [IU]/kg | INTRAMUSCULAR | Status: DC | PRN
  Filled 2015-12-09: qty 2

## 2015-12-09 NOTE — Progress Notes (Signed)
[place] for Heparin + Warfarin Indication: atrial fibrillation  Allergies  Allergen Reactions  . Oxycodone Other (See Comments)    hallucinations  . Clonazepam Other (See Comments)    Unknown reaction  . Gemfibrozil Other (See Comments)    Patient is not aware of this allergy but states that she has side effects to a cholesterol medication in the past  . Carbamazepine Other (See Comments)    Reaction to tegretol - loopy  . Macrobid [Nitrofurantoin] Nausea And Vomiting    Patient Measurements: Height: 5\' 4"  (162.6 cm) Weight: 204 lb 5.9 oz (92.7 kg) IBW/kg (Calculated) : 54.7 Heparin Dosing Weight: 76.4 kg  Vital Signs: Temp: 98.1 F (36.7 C) (07/15 1239) Temp Source: Oral (07/15 1239) BP: 127/40 mmHg (07/15 1239) Pulse Rate: 53 (07/15 1239)  Labs:  Recent Labs  12/07/15 0523 12/07/15 0524  12/08/15 0319 12/08/15 0322 12/08/15 1431 12/09/15 0347  HGB 8.8*  --   --  9.4*  --   --  9.9*  HCT 27.9*  --   --  30.5*  --   --  31.4*  PLT 141*  --   --  161  --   --  188  LABPROT 15.7*  --   --  17.0*  --  17.4* 19.7*  INR 1.23  --   --  1.37  --  1.41 1.67*  HEPARINUNFRC  --  0.28*  < >  --  0.17* 0.51 0.52  CREATININE  --  3.70*  --  3.09*  --   --  3.75*  < > = values in this interval not displayed.  Estimated Creatinine Clearance: 15.4 mL/min (by C-G formula based on Cr of 3.75).   Assessment: AC/Heme: Warfarin PTA for Hx Afib. CHADS vasc score 5. Heparin gtt started 7/4 when INR <2 for IR placement of tunneled cath 7/5. IR fistula placement 7/10. Coumadin resumed on 12/04/15.  --*PTA: 5mg  daily except 2.5mg  on Tuesday and Thursday (last PTA dose 6/15) INR on admit 2.55 INR today is 1.67  (INR trend:  1.16>1.19>.23 >1.37>1.41>1.67) Heparin is therapeutic.   -on amiod 200mg  daily pta/resumed - Anemia of chronic kidney disease stage 4. - Hemoglobin 9.9. PLTC wnl 188k. No bleeding noted. Chronic kidney disease stage IV , now  ESRD, HD done on 7/13..  Goal of Therapy:  INR 2-3 Heparin level 0.3-0.7 units/ml Monitor platelets by anticoagulation protocol: Yes   Plan:   Continue heparin to 1700 units/hr Warfarin 10mg  tonight Daily heparin level, INR and CBC.  Onnie Boer, PharmD Pager: [phone] 12/09/2015 1:14 PM

## 2015-12-09 NOTE — Procedures (Signed)
I was present at this dialysis session. I have reviewed the session itself and made appropriate changes.   Filed Weights   12/08/15 0248 12/09/15 0625 12/09/15 0805  Weight: 94.167 kg (207 lb 9.6 oz) 96.389 kg (212 lb 8 oz) 96.9 kg (213 lb 10 oz)     Recent Labs Lab 12/09/15 0347  NA 132*  K 4.8  CL 94*  CO2 25  GLUCOSE 124*  BUN 46*  CREATININE 3.75*  CALCIUM 9.1  PHOS 4.6     Recent Labs Lab 12/07/15 0523 12/08/15 0319 12/09/15 0347  WBC 5.8 6.7 7.5  HGB 8.8* 9.4* 9.9*  HCT 27.9* 30.5* 31.4*  MCV 91.5 92.7 91.5  PLT 141* 161 188    Scheduled Meds: . amiodarone  200 mg Oral Daily  . amLODipine  5 mg Oral QHS  . atorvastatin  80 mg Oral Daily  . bisacodyl  10 mg Rectal Once  . darbepoetin (ARANESP) injection - DIALYSIS  150 mcg Intravenous Q Thu-HD  . docusate sodium  100 mg Oral BID  . insulin aspart  0-15 Units Subcutaneous TID WC  . insulin aspart  0-5 Units Subcutaneous QHS  . insulin aspart protamine- aspart  15 Units Subcutaneous BID WC  . loratadine  10 mg Oral Daily  . multivitamin  1 tablet Oral QHS  . polyethylene glycol  17 g Oral BID  . protein supplement  1 scoop Oral TID WC  . sevelamer carbonate  800 mg Oral TID WC  . sodium chloride flush  10-40 mL Intracatheter Q12H  . sodium chloride flush  10-40 mL Intracatheter Q12H  . sodium chloride flush  3 mL Intravenous Q12H  . Warfarin - Pharmacist Dosing Inpatient   Does not apply q1800   Continuous Infusions: . sodium chloride 10 mL/hr at 12/04/15 0944  . heparin 1,700 Units/hr (12/08/15 2045)   PRN Meds:.sodium chloride, acetaminophen, diazepam, heparin, heparin, HYDROcodone-acetaminophen, hydrocortisone, ondansetron (ZOFRAN) IV, sodium chloride flush, sodium chloride flush, sodium chloride flush   Donetta Potts,  MD 12/09/2015, 9:50 AM

## 2015-12-09 NOTE — Progress Notes (Signed)
Progress Note    Briana [name]  RAX:[id number] DOB: 1945/08/05  DOA: 11/10/2015 PCP: Antionette Fairy, PA-C    Brief Narrative:   Briana [name] is an 70 y.o. female with PMH of DM II/HTN/HLD/PVD/CKD IV/A-fib (on coumadin)/CAD s/p CABG 2005 and chronic Diastolic CHF who Was admitted 11/10/15 with a chief complaint of a two-week history of dyspnea secondary to CHF. Hospital course complicated by the development of end-stage renal disease necessitating initiation of HD. Underwent placement of a basilic vein transposition fistula 12/01/15.  Assessment/Plan:   Principal Problem:   Acute on chronic diastolic (congestive) heart failure ([place]) Did not respond initially to aggressive diuresis and ultimately required CRRT for 72 hours. Plan is to continue HD/ultrafiltration for volume management. Continue BiDil. Follow-up with cardiology as an outpatient.  Active Problems:   End-stage renal disease HD initiated. Now has fistula for HD. Has been accepted at [facility] for second shift. Phosphorous controlled, continue sevelamer, no vitamin D due to low iPTH at 87.    Controlled diabetes mellitus type 2 with complications ([place]) Currently being managed with NovoLog 70/30, 15 units twice a day and moderate scale SSI. CBGs 119-183.    CAD in native artery Continue statin, Norvasc.    Essential hypertension Continue Norvasc.    Anemia of chronic kidney failure On Epogen and IV iron per renal.    Atrial fibrillation Continue amiodarone. Currently on heparin. Coumadin resumed. INR 1.67. Can likely be discharged home tomorrow.    Painful diabetic neuropathy/possible restless leg syndrome Intolerant of Neurontin. We'll try Requip.   Family Communication/Anticipated D/C date and plan/Code Status   DVT prophylaxis: Coumadin ordered. Code Status: Full Code.  Family Communication: No family at the bedside. Disposition Plan: Probable discharge home 12/10/15. Will need home health PT and  intermittent supervision.   Medical Consultants:    Vascular Surgery  Interventional Radiology   Procedures:   Hemodialysis initiated Vascular ultrasound upper extremity vein mapping 11/28/15  Dg Chest 2 View  11/10/2015  CLINICAL DATA:  Shortness of breath EXAM: CHEST  2 VIEW COMPARISON:  10/11/2015 FINDINGS: Chronic cardiopericardial enlargement. Dialysis catheter has been removed. Prominence of the pulmonary arterial contour, chronic. Pulmonary venous congestion that is similar to prior. Trace left pleural effusion. Focal opacity at the medial right base with new obscuration of the medial diaphragm. This has a streaky appearance in the lateral projection. Prior median sternotomy for CABG. IMPRESSION: Mild CHF. Atelectasis or bronchopneumonia at the medial right base. Electronically Signed   By: Monte Fantasia M.D.   On: 11/10/2015 15:12   Ir Fluoro Guide Cv Line Right  11/29/2015  INDICATION: 41-year-old female with a history of renal failure EXAM: TUNNELED PICC LINE WITH ULTRASOUND AND FLUOROSCOPIC GUIDANCE MEDICATIONS: 2.0 g Ancef. The antibiotic was given in an appropriate time interval prior to skin puncture. ANESTHESIA/SEDATION: Versed 1.0 mg IV; Fentanyl 25 mcg IV; Moderate Sedation Time:  15 The patient was continuously monitored during the procedure by the interventional radiology nurse under my direct supervision. FLUOROSCOPY TIME:  Fluoroscopy Time: 0 minutes 24 seconds (2.6 mGy). COMPLICATIONS: None PROCEDURE: Informed written consent was obtained from the patient after a discussion of the risks, benefits, and alternatives to treatment. Questions regarding the procedure were encouraged and answered. The right neck and chest were prepped with chlorhexidine in a sterile fashion, and a sterile drape was applied covering the operative field. Maximum barrier sterile technique with sterile gowns and gloves were used for the procedure. A timeout was performed  prior to the initiation of the  procedure. After creating a small venotomy incision, a micropuncture kit was utilized to access the right internal jugular vein under direct, real-time ultrasound guidance after the overlying soft tissues were anesthetized with 1% lidocaine with epinephrine. Ultrasound image documentation was performed. The microwire was marked to measure appropriate internal catheter length. External tunneled length was estimated. A total tip to cuff length of 23 cm was selected. Skin and subcutaneous tissues of chest wall below the clavicle were generously infiltrated with 1% lidocaine for local anesthesia. A small stab incision was made with 11 blade scalpel. The selected hemodialysis catheter was tunneled in a retrograde fashion from the anterior chest wall to the venotomy incision. A guidewire was advanced to the level of the IVC and the micropuncture sheath was exchanged for a peel-away sheath. The catheter was then placed through the peel-away sheath with tips ultimately positioned within the superior aspect of the right atrium. Final catheter positioning was confirmed and documented with a spot radiographic image. The catheter aspirates and flushes normally. The catheter was flushed with appropriate volume heparin dwells. The catheter exit site was secured with a 0-Prolene retention suture. The venotomy incision was closed Derma bond and sterile dressing. Dressings were applied at the chest wall. The indwelling temporary HD catheter was removed. Final image was stored. Patient tolerated the procedure well and remained hemodynamically stable throughout. No complications were encountered and no significant blood loss encountered. FINDINGS: After catheter placement, the tip lies within the cavoatrial junction The catheter aspirates and flushes normally and is ready for immediate use. IMPRESSION: Status post placement of right IJ tunneled hemodialysis catheter. Catheter ready for use. Signed, Dulcy Fanny. Earleen Newport, DO Vascular and  Interventional Radiology Specialists [facility] Radiology Electronically Signed   By: Corrie Mckusick D.O.   On: 11/29/2015 15:44   Ir US Guide Vasc Access Right  11/29/2015  INDICATION: 1-year-old female with a history of renal failure EXAM: TUNNELED PICC LINE WITH ULTRASOUND AND FLUOROSCOPIC GUIDANCE MEDICATIONS: 2.0 g Ancef. The antibiotic was given in an appropriate time interval prior to skin puncture. ANESTHESIA/SEDATION: Versed 1.0 mg IV; Fentanyl 25 mcg IV; Moderate Sedation Time:  15 The patient was continuously monitored during the procedure by the interventional radiology nurse under my direct supervision. FLUOROSCOPY TIME:  Fluoroscopy Time: 0 minutes 24 seconds (2.6 mGy). COMPLICATIONS: None PROCEDURE: Informed written consent was obtained from the patient after a discussion of the risks, benefits, and alternatives to treatment. Questions regarding the procedure were encouraged and answered. The right neck and chest were prepped with chlorhexidine in a sterile fashion, and a sterile drape was applied covering the operative field. Maximum barrier sterile technique with sterile gowns and gloves were used for the procedure. A timeout was performed prior to the initiation of the procedure. After creating a small venotomy incision, a micropuncture kit was utilized to access the right internal jugular vein under direct, real-time ultrasound guidance after the overlying soft tissues were anesthetized with 1% lidocaine with epinephrine. Ultrasound image documentation was performed. The microwire was marked to measure appropriate internal catheter length. External tunneled length was estimated. A total tip to cuff length of 23 cm was selected. Skin and subcutaneous tissues of chest wall below the clavicle were generously infiltrated with 1% lidocaine for local anesthesia. A small stab incision was made with 11 blade scalpel. The selected hemodialysis catheter was tunneled in a retrograde fashion from the  anterior chest wall to the venotomy incision. A guidewire was advanced to the  level of the IVC and the micropuncture sheath was exchanged for a peel-away sheath. The catheter was then placed through the peel-away sheath with tips ultimately positioned within the superior aspect of the right atrium. Final catheter positioning was confirmed and documented with a spot radiographic image. The catheter aspirates and flushes normally. The catheter was flushed with appropriate volume heparin dwells. The catheter exit site was secured with a 0-Prolene retention suture. The venotomy incision was closed Derma bond and sterile dressing. Dressings were applied at the chest wall. The indwelling temporary HD catheter was removed. Final image was stored. Patient tolerated the procedure well and remained hemodynamically stable throughout. No complications were encountered and no significant blood loss encountered. FINDINGS: After catheter placement, the tip lies within the cavoatrial junction The catheter aspirates and flushes normally and is ready for immediate use. IMPRESSION: Status post placement of right IJ tunneled hemodialysis catheter. Catheter ready for use. Signed, Dulcy Fanny. Earleen Newport, DO [facility] Special[facility] Radiology Electronically Signed   By: Corrie Mckusick D.O.   On: 11/29/2015 15:44   Dg Chest Port 1 View  11/22/2015  CLINICAL DATA:  70 year old female with dialysis catheter placement EXAM: PORTABLE CHEST 1 VIEW COMPARISON:  Chest radiograph dated 11/10/2015 FINDINGS: There has been interval placement of a right IJ dialysis catheter with tip at the cavoatrial junction. There is cardiomegaly with mild vascular congestion. Bibasilar linear and platelike atelectasis/ scarring. Focal density at the right lung base likely represent atelectasis/scarring. Infiltrate is not excluded. Clinical correlation is recommended. Median sternotomy wires. No acute osseous pathology. IMPRESSION:  Interval placement of a right IJ dialysis catheter with tip at the cavoatrial junction. No pneumothorax. Cardiomegaly with mild congestive changes. Bibasilar atelectatic changes. Infiltrate less likely. Clinical correlation is recommended. Electronically Signed   By: Anner Crete M.D.   On: 11/22/2015 00:26    Anti-Infectives:   None.  Subjective:    Clarinda J Boomershine is sitting up in the chair feels well. No complaints of dyspnea or chest pain. Reports significant lower extremity pain and restless legs type symptoms. Requests medication to treat this. Has been intolerant of Neurontin in the past due to excessive lethargy.  Objective:    Filed Vitals:   12/09/15 1129 12/09/15 1200 12/09/15 1206 12/09/15 1239  BP: 112/53 155/57 105/44 127/40  Pulse: 55 56 60 53  Temp:   98.4 F (36.9 C) 98.1 F (36.7 C)  TempSrc:   Oral Oral  Resp:   18 20  Height:      Weight:   92.7 kg (204 lb 5.9 oz)   SpO2:   98% 98%    Intake/Output Summary (Last 24 hours) at 12/09/15 1831 Last data filed at 12/09/15 1823  Gross per 24 hour  Intake 547.43 ml  Output   3857 ml  Net -3309.57 ml   Filed Weights   12/09/15 0625 12/09/15 0805 12/09/15 1206  Weight: 96.389 kg (212 lb 8 oz) 96.9 kg (213 lb 10 oz) 92.7 kg (204 lb 5.9 oz)    Exam: General exam: Appears calm and comfortable. Sitting up in chair. Respiratory system: Clear to auscultation. Respiratory effort normal. Cardiovascular system: S1 & S2 heard, RRR. No JVD,  rubs, gallops or clicks. No murmurs. Gastrointestinal system: Abdomen is nondistended, soft and nontender. No organomegaly or masses felt. Normal bowel sounds heard. Central nervous system: Alert and oriented. No focal neurological deficits. Extremities: No clubbing, edema, or cyanosis. Left AVF with thrill/bruit. Skin: No rashes, lesions or ulcers Psychiatry:  Judgement and insight appear normal. Mood & affect appropriate.   Data Reviewed:   I have personally reviewed  following labs and imaging studies:  Labs: Basic Metabolic Panel:  Recent Labs Lab 12/05/15 0314 12/06/15 0054 12/07/15 0523 12/07/15 0524 12/08/15 0319 12/09/15 0347  NA 130* 133*  --  129* 131* 132*  K 4.6 4.2  --  4.0 4.2 4.8  CL 95* 97*  --  93* 94* 94*  CO2 25 27  --  '25 26 25  ' GLUCOSE 88 109*  --  138* 102* 124*  BUN 51* 27*  --  47* 26* 46*  CREATININE 4.11* 2.81*  --  3.70* 3.09* 3.75*  CALCIUM 8.5* 8.0*  --  8.4* 8.5* 9.1  MG 2.5* 2.2 2.3  --  2.1 2.3  PHOS 6.1* 3.7  --  5.2* 3.6 4.6   GFR Estimated Creatinine Clearance: 15.4 mL/min (by C-G formula based on Cr of 3.75). Liver Function Tests:  Recent Labs Lab 12/05/15 0314 12/06/15 0054 12/07/15 0524 12/08/15 0319 12/09/15 0347  ALBUMIN 3.0* 3.0* 2.9* 2.9* 3.0*   Coagulation profile  Recent Labs Lab 12/06/15 0054 12/07/15 0523 12/08/15 0319 12/08/15 1431 12/09/15 0347  INR 1.18 1.23 1.37 1.41 1.67*    CBC:  Recent Labs Lab 12/04/15 0401 12/06/15 0054 12/07/15 0523 12/08/15 0319 12/09/15 0347  WBC 7.4 6.8 5.8 6.7 7.5  HGB 9.3* 8.9* 8.8* 9.4* 9.9*  HCT 29.3* 30.2* 27.9* 30.5* 31.4*  MCV 90.4 94.1 91.5 92.7 91.5  PLT 182 157 141* 161 188   CBG:  Recent Labs Lab 12/08/15 1639 12/08/15 2226 12/09/15 0623 12/09/15 1237 12/09/15 1644  GLUCAP 183* 177* 146* 115* 207*   Microbiology Recent Results (from the past 240 hour(s))  Surgical pcr screen     Status: None   Collection Time: 12/04/15  7:08 AM  Result Value Ref Range Status   MRSA, PCR NEGATIVE NEGATIVE Final   Staphylococcus aureus NEGATIVE NEGATIVE Final    Comment:        The Xpert SA Assay (FDA approved for NASAL specimens in patients over 57 years of age), is one component of a comprehensive surveillance program.  Test performance has been validated by [facility] for patients greater than or equal to 67 year old. It is not intended to diagnose infection nor to guide or monitor treatment.     Radiology: No  results found.  Medications:   . amiodarone  200 mg Oral Daily  . amLODipine  10 mg Oral QHS  . atorvastatin  80 mg Oral Daily  . bisacodyl  10 mg Rectal Once  . darbepoetin (ARANESP) injection - DIALYSIS  150 mcg Intravenous Q Thu-HD  . docusate sodium  100 mg Oral BID  . insulin aspart  0-15 Units Subcutaneous TID WC  . insulin aspart  0-5 Units Subcutaneous QHS  . insulin aspart protamine- aspart  15 Units Subcutaneous BID WC  . loratadine  10 mg Oral Daily  . multivitamin  1 tablet Oral QHS  . polyethylene glycol  17 g Oral BID  . protein supplement  1 scoop Oral TID WC  . rOPINIRole  0.25 mg Oral QHS  . sevelamer carbonate  800 mg Oral TID WC  . sodium chloride flush  10-40 mL Intracatheter Q12H  . sodium chloride flush  10-40 mL Intracatheter Q12H  . sodium chloride flush  3 mL Intravenous Q12H  . warfarin  10 mg Oral ONCE-1800  . Warfarin - Pharmacist Dosing Inpatient  Does not apply q1800   Continuous Infusions: . sodium chloride 10 mL/hr at 12/04/15 0944  . heparin 1,700 Units/hr (12/09/15 1200)    Time spent: 25 minutes.   LOS: 29 days   [place] [facility] Pager [phone]. If unable to reach me by pager, please call my cell phone at [phone].  *Please refer to [email or web address], password [email or web address] to get updated schedule on who will round on this patient, as hospitalists switch teams weekly. If 7PM-7AM, please contact night-coverage at [email or web address], password [email or web address] for any overnight needs.  12/09/2015, 6:31 PM

## 2015-12-10 LAB — GLUCOSE, CAPILLARY
GLUCOSE-CAPILLARY: 113 mg/dL — AB (ref 65–99)
Glucose-Capillary: 200 mg/dL — ABNORMAL HIGH (ref 65–99)

## 2015-12-10 LAB — RENAL FUNCTION PANEL
ANION GAP: 11 (ref 5–15)
Albumin: 3.3 g/dL — ABNORMAL LOW (ref 3.5–5.0)
BUN: 25 mg/dL — AB (ref 6–20)
CHLORIDE: 94 mmol/L — AB (ref 101–111)
CO2: 26 mmol/L (ref 22–32)
Calcium: 8.9 mg/dL (ref 8.9–10.3)
Creatinine, Ser: 3.11 mg/dL — ABNORMAL HIGH (ref 0.44–1.00)
GFR, EST AFRICAN AMERICAN: 16 mL/min — AB (ref >60)
GFR, EST NON AFRICAN AMERICAN: 14 mL/min — AB (ref >60)
Glucose, Bld: 91 mg/dL (ref 65–99)
Phosphorus: 3.9 mg/dL (ref 2.5–4.6)
Potassium: 3.7 mmol/L (ref 3.5–5.1)
SODIUM: 131 mmol/L — AB (ref 135–145)

## 2015-12-10 LAB — CBC
HCT: 32.3 % — ABNORMAL LOW (ref 36.0–46.0)
Hemoglobin: 10.1 g/dL — ABNORMAL LOW (ref 12.0–15.0)
MCH: 28.6 pg (ref 26.0–34.0)
MCHC: 31.3 g/dL (ref 30.0–36.0)
MCV: 91.5 fL (ref 78.0–100.0)
PLATELETS: 185 10*3/uL (ref 150–400)
RBC: 3.53 MIL/uL — AB (ref 3.87–5.11)
RDW: 17.8 % — AB (ref 11.5–15.5)
WBC: 6.9 10*3/uL (ref 4.0–10.5)

## 2015-12-10 LAB — HEPARIN LEVEL (UNFRACTIONATED): HEPARIN UNFRACTIONATED: 0.5 [IU]/mL (ref 0.30–0.70)

## 2015-12-10 LAB — PROTIME-INR
INR: 1.85 — AB (ref 0.00–1.49)
PROTHROMBIN TIME: 21.2 s — AB (ref 11.6–15.2)

## 2015-12-10 LAB — MAGNESIUM: Magnesium: 2.2 mg/dL (ref 1.7–2.4)

## 2015-12-10 MED ORDER — POLYETHYLENE GLYCOL 3350 17 G PO PACK: 17.0000 g | PACK | Freq: Two times a day (BID) | ORAL | Status: DC

## 2015-12-10 MED ORDER — HYDROCORTISONE 2.5 % RE CREA: TOPICAL_CREAM | Freq: Two times a day (BID) | RECTAL | Status: DC | PRN

## 2015-12-10 MED ORDER — WARFARIN SODIUM 2.5 MG PO TABS: 12.5000 mg | ORAL_TABLET | Freq: Once | ORAL | Status: DC

## 2015-12-10 MED ORDER — WARFARIN SODIUM 10 MG PO TABS: 10.0000 mg | ORAL_TABLET | Freq: Once | ORAL | Status: DC

## 2015-12-10 MED ORDER — ATORVASTATIN CALCIUM 80 MG PO TABS: 80.0000 mg | ORAL_TABLET | Freq: Every day | ORAL | Status: DC

## 2015-12-10 MED ORDER — AMLODIPINE BESYLATE 10 MG PO TABS: 10.0000 mg | ORAL_TABLET | Freq: Every day | ORAL | Status: DC

## 2015-12-10 MED ORDER — WARFARIN SODIUM 5 MG PO TABS: 5.0000 mg | ORAL_TABLET | Freq: Every day | ORAL | Status: DC

## 2015-12-10 MED ORDER — ROPINIROLE HCL 0.25 MG PO TABS: 0.2500 mg | ORAL_TABLET | Freq: Every day | ORAL | Status: DC

## 2015-12-10 NOTE — Progress Notes (Addendum)
[facility] for Heparin + Warfarin Indication: atrial fibrillation  Allergies  Allergen Reactions  . Oxycodone Other (See Comments)    hallucinations  . Clonazepam Other (See Comments)    Unknown reaction  . Gemfibrozil Other (See Comments)    Patient is not aware of this allergy but states that she has side effects to a cholesterol medication in the past  . Carbamazepine Other (See Comments)    Reaction to tegretol - loopy  . Macrobid [Nitrofurantoin] Nausea And Vomiting    Patient Measurements: Height: 5\' 4"  (162.6 cm) Weight: 205 lb 8 oz (93.214 kg) (c scale) IBW/kg (Calculated) : 54.7 Heparin Dosing Weight: 76.4 kg  Vital Signs: Temp: 97.9 F (36.6 C) (07/16 1217) Temp Source: Oral (07/16 1217) BP: 103/36 mmHg (07/16 1217) Pulse Rate: 52 (07/16 1217)  Labs:  Recent Labs  12/08/15 0319  12/08/15 1431 12/09/15 0347 12/10/15 0246  HGB 9.4*  --   --  9.9* 10.1*  HCT 30.5*  --   --  31.4* 32.3*  PLT 161  --   --  188 185  LABPROT 17.0*  --  17.4* 19.7* 21.2*  INR 1.37  --  1.41 1.67* 1.85*  HEPARINUNFRC  --   < > 0.51 0.52 0.50  CREATININE 3.09*  --   --  3.75* 3.11*  < > = values in this interval not displayed.  Estimated Creatinine Clearance: 18.6 mL/min (by C-G formula based on Cr of 3.11).   Assessment: AC/Heme: Warfarin PTA for Hx Afib. CHADS vasc score 5. Heparin gtt started 7/4 when INR <2 for IR placement of tunneled cath 7/5. IR fistula placement 7/10. Coumadin resumed on 12/04/15.  --*PTA: 5mg  daily except 2.5mg  on Tuesday and Thursday (last PTA dose 6/15) INR on admit 2.55 INR today is 1.85. Hopefully, it'll be therapeutic in Am.  Heparin is therapeutic.   -on amiod 200mg  daily pta/resumed - Anemia of chronic kidney disease stage 4. - Hemoglobin 10.1. PLTC wnl 185k. No bleeding noted. Chronic kidney disease stage IV , now ESRD, HD done on 7/13..  Goal of Therapy:  INR 2-3 Heparin level 0.3-0.7 units/ml Monitor  platelets by anticoagulation protocol: Yes   Plan:   Continue heparin to 1700 units/hr Repeat warfarin 12.5mg  tonight Daily heparin level, INR and CBC.  Onnie Boer, PharmD Pager: [phone] 12/10/2015 12:48 PM   If DC today, consider 8mg  qday starting tomorrow and check INR early next week

## 2015-12-10 NOTE — Discharge Summary (Addendum)
Physician Discharge Summary  Briana [name] [id number] DOB: May 15, 1946 DOA: 11/10/2015  PCP: Antionette Fairy, PA-C  Admit date: 11/10/2015 Discharge date: 12/10/2015   Recommendations for Outpatient Follow-Up:   1. Needs daily PT/INR checks, which will be drawn by home health RN and faxed to her PCP. 2. Patient declined home physical therapy.   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    Discharge Condition: Improved.  Diet recommendation: Low sodium, heart healthy.  Carbohydrate-modified.  Regular.  Wound care: Patient has a new AV fistula and a right chest wall dialysis catheter that needs to be monitored closely by home health RN.   History of Present Illness:   Briana [name] is an 70 y.o. female with PMH of DM II/HTN/HLD/PVD/CKD IV/A-fib (on coumadin)/CAD s/p CABG 2005 and chronic Diastolic CHF who Was admitted 11/10/15 with a chief complaint of a two-week history of dyspnea secondary to CHF. Hospital course complicated by the development of end-stage renal disease necessitating initiation of HD. Underwent placement of a basilic vein transposition fistula 12/01/15.  Hospital Course by Problem:   Principal Problem:  Acute on chronic diastolic (congestive) heart failure (HCC) Did not respond initially to aggressive diuresis and ultimately required CRRT for 72 hours. Now on HD/ultrafiltration for volume management. Follow-up with cardiology  as an outpatient. Was on Lasix and Bidil prior to admission but these have been discontinued due to soft blood pressure.  Active Problems:  End-stage renal disease with mild protein calorie malnutrition/hypoalbuminemia HD initiated. Now has fistula for HD. Has been accepted at [facility] for second shift. Phosphorous controlled, continue PhosLo, no vitamin D due to low iPTH at 87.   Controlled diabetes mellitus type 2 with complications ([place]) Currently being managed with NovoLog 70/30, 15 units twice a day and moderate scale SSI. CBGs 113-207. Will need close follow-up of glycemic control.   CAD in native artery Continue statin, Norvasc.   Essential hypertension Continue Norvasc.   Anemia of chronic kidney failure On Epogen and IV iron per renal.   Atrial fibrillation Continue amiodarone. Coumadin resumed but INR not yet therapeutic. We'll have home health RN draw daily PT/INR levels with further adjustment per PCP. Will discharge on Coumadin 5 mg daily.   Painful diabetic neuropathy/possible restless leg syndrome Intolerant of Neurontin. Requip initiated 12/09/15.   Medical Consultants:    Vascular Surgery  Interventional Radiology  Nephrology   Discharge Exam:   Filed Vitals:   12/10/15 0607 12/10/15 1217  BP: 127/40 103/36  Pulse: 49 52  Temp: 98.4 F (36.9 C) 97.9 F (36.6 C)  Resp: 18 20   Filed Vitals:   12/09/15 1239 12/09/15 2050 12/10/15 0607 12/10/15 1217  BP: 127/40 113/39 127/40 103/36  Pulse: 53 57 49 52  Temp: 98.1 F (36.7 C) 98.5 F (36.9 C) 98.4 F (36.9 C) 97.9 F (36.6 C)  TempSrc: Oral Oral Oral Oral  Resp: 20 18 18 20   Height:  Weight:   93.214 kg (205 lb 8 oz)   SpO2: 98% 100% 94% 98%   General exam: Appears calm and comfortable. Sitting up in chair. Respiratory system: Clear to auscultation. Respiratory effort normal. Cardiovascular system: S1 & S2 heard, RRR. No JVD, rubs, gallops or clicks. No murmurs. Gastrointestinal  system: Abdomen is nondistended, soft and nontender. No organomegaly or masses felt. Normal bowel sounds heard. Central nervous system: Alert and oriented. No focal neurological deficits. Extremities: No clubbing, edema, or cyanosis. Left AVF with thrill/bruit. Skin: No rashes, lesions or ulcers Psychiatry: Judgement and insight appear normal. Mood & affect appropriate.   The results of significant diagnostics from this hospitalization (including imaging, microbiology, ancillary and laboratory) are listed below for reference.     Procedures and Diagnostic Studies:   Dg Chest 2 View  11/10/2015  CLINICAL DATA:  Shortness of breath EXAM: CHEST  2 VIEW COMPARISON:  10/11/2015 FINDINGS: Chronic cardiopericardial enlargement. Dialysis catheter has been removed. Prominence of the pulmonary arterial contour, chronic. Pulmonary venous congestion that is similar to prior. Trace left pleural effusion. Focal opacity at the medial right base with new obscuration of the medial diaphragm. This has a streaky appearance in the lateral projection. Prior median sternotomy for CABG. IMPRESSION: Mild CHF. Atelectasis or bronchopneumonia at the medial right base. Electronically Signed   By: Monte Fantasia M.D.   On: 11/10/2015 15:12     Labs:   Basic Metabolic Panel:  Recent Labs Lab 12/06/15 0054 12/07/15 0523 12/07/15 0524 12/08/15 0319 12/09/15 0347 12/10/15 0246  NA 133*  --  129* 131* 132* 131*  K 4.2  --  4.0 4.2 4.8 3.7  CL 97*  --  93* 94* 94* 94*  CO2 27  --  25 26 25 26   GLUCOSE 109*  --  138* 102* 124* 91  BUN 27*  --  47* 26* 46* 25*  CREATININE 2.81*  --  3.70* 3.09* 3.75* 3.11*  CALCIUM 8.0*  --  8.4* 8.5* 9.1 8.9  MG 2.2 2.3  --  2.1 2.3 2.2  PHOS 3.7  --  5.2* 3.6 4.6 3.9   GFR Estimated Creatinine Clearance: 18.6 mL/min (by C-G formula based on Cr of 3.11). Liver Function Tests:  Recent Labs Lab 12/06/15 0054 12/07/15 0524 12/08/15 0319 12/09/15 0347 12/10/15 0246    ALBUMIN 3.0* 2.9* 2.9* 3.0* 3.3*   Coagulation profile  Recent Labs Lab 12/07/15 0523 12/08/15 0319 12/08/15 1431 12/09/15 0347 12/10/15 0246  INR 1.23 1.37 1.41 1.67* 1.85*    CBC:  Recent Labs Lab 12/06/15 0054 12/07/15 0523 12/08/15 0319 12/09/15 0347 12/10/15 0246  WBC 6.8 5.8 6.7 7.5 6.9  HGB 8.9* 8.8* 9.4* 9.9* 10.1*  HCT 30.2* 27.9* 30.5* 31.4* 32.3*  MCV 94.1 91.5 92.7 91.5 91.5  PLT 157 141* 161 188 185   CBG:  Recent Labs Lab 12/09/15 1237 12/09/15 1644 12/09/15 2043 12/10/15 0535 12/10/15 1129  GLUCAP 115* 207* 204* 113* 200*   Microbiology Recent Results (from the past 240 hour(s))  Surgical pcr screen     Status: None   Collection Time: 12/04/15  7:08 AM  Result Value Ref Range Status   MRSA, PCR NEGATIVE NEGATIVE Final   Staphylococcus aureus NEGATIVE NEGATIVE Final    Comment:        The Xpert SA Assay (FDA approved for NASAL specimens in patients over 78 years of age), is one component of a comprehensive surveillance program.  Test performance has been validated by [facility]  for patients greater than or equal to 63 year old. It is not intended to diagnose infection nor to guide or monitor treatment.      Discharge Instructions:       Discharge Instructions    Call MD for:  extreme fatigue    Complete by:  As directed      Call MD for:  persistant dizziness or light-headedness    Complete by:  As directed      Call MD for:  persistant nausea and vomiting    Complete by:  As directed      Call MD for:  redness, tenderness, or signs of infection (pain, swelling, redness, odor or green/yellow discharge around incision site)    Complete by:  As directed      Call MD for:  temperature >100.4    Complete by:  As directed      Diet - low sodium heart healthy    Complete by:  As directed      Diet Carb Modified    Complete by:  As directed      Face-to-face encounter (required for Medicare/Medicaid patients)    Complete by:   As directed   I Gershom Brobeck certify that this patient is under my care and that I, or a nurse practitioner or physician's assistant working with me, had a face-to-face encounter that meets the physician face-to-face encounter requirements with this patient on 12/10/2015. The encounter with the patient was in whole, or in part for the following medical condition(s) which is the primary reason for home health care (List medical condition): New ESRD s/p AV fistula, needs post op wound assessment & care, daily PT/INR checks until coumadin levels therapeutic.  Please fax INR results to [facility] in [place].  Attn: Neysa Hotter, P.A.  The encounter with the patient was in whole, or in part, for the following medical condition, which is the primary reason for home health care:  ESRD, new HD catheter, needs daily PT/INR checks until therapeutic  I certify that, based on my findings, the following services are medically necessary home health services:  Nursing  Reason for Medically Necessary Home Health Services:   Skilled Nursing- Changes in Medication/Medication Management Skilled Nursing- Skilled Assessment/Observation Skilled Nursing- Teaching of Disease Process/Symptom Management Skilled Nursing- Lab Monitoring Requiring Frequent Medication Adjustment Skilled Nursing- Post-Surgical Wound Assessment and Care    My clinical findings support the need for the above services:  Unable to leave home safely without assistance and/or assistive device  Further, I certify that my clinical findings support that this patient is homebound due to:  Unable to leave home safely without assistance     Home Health    Complete by:  As directed   To provide the following care/treatments:  RN     Increase activity slowly    Complete by:  As directed             Medication List    STOP taking these medications        furosemide 40 MG tablet  Commonly known as:  LASIX      HYDROcodone-acetaminophen 7.5-325 MG tablet  Commonly known as:  NORCO     isosorbide-hydrALAZINE 20-37.5 MG tablet  Commonly known as:  BIDIL     potassium chloride SA 20 MEQ tablet  Commonly known as:  K-DUR,KLOR-CON     Vitamin D 2000 units tablet      TAKE these medications  amiodarone 200 MG tablet  Commonly known as:  PACERONE  Take 1 tablet (200 mg total) by mouth daily.     amLODipine 10 MG tablet  Commonly known as:  NORVASC  Take 1 tablet (10 mg total) by mouth daily.     aspirin 81 MG EC tablet  Take 1 tablet (81 mg total) by mouth daily.     atorvastatin 80 MG tablet  Commonly known as:  LIPITOR  Take 1 tablet (80 mg total) by mouth daily.     calcium acetate 667 MG capsule  Commonly known as:  PHOSLO  Take 2 capsules (1,334 mg total) by mouth 3 (three) times daily with meals.     cetirizine 10 MG tablet  Commonly known as:  ZYRTEC  Take 10 mg by mouth at bedtime.     hydrocortisone 2.5 % rectal cream  Commonly known as:  ANUSOL-HC  Place rectally 2 (two) times daily as needed for hemorrhoids or itching.     insulin NPH-regular Human (70-30) 100 UNIT/ML injection  Commonly known as:  NOVOLIN 70/30  Inject 15 Units into the skin 2 (two) times daily with a meal.     metoprolol tartrate 25 MG tablet  Commonly known as:  LOPRESSOR  Take 1 tablet (25 mg total) by mouth 2 (two) times daily.     nitroGLYCERIN 0.4 MG SL tablet  Commonly known as:  NITROSTAT  Place 1 tablet (0.4 mg total) under the tongue every 5 (five) minutes as needed for chest pain. Max three pills     polyethylene glycol packet  Commonly known as:  MIRALAX / GLYCOLAX  Take 17 g by mouth 2 (two) times daily.     protein supplement Powd  Take 6 g by mouth 3 (three) times daily with meals.     rOPINIRole 0.25 MG tablet  Commonly known as:  REQUIP  Take 1 tablet (0.25 mg total) by mouth at bedtime.     warfarin 5 MG tablet  Commonly known as:  COUMADIN  Take 1 tablet (5 mg  total) by mouth daily at 6 PM.       Follow-up Information    Follow up with [facility] .   Specialty:  [place] information:   [address] [place] [phone]      Follow up with [facility].   Why:  They will do your home health care at your home   Contact information:   [address] [place] [address] [phone]       Follow up with Curt Jews, MD In 4 weeks.   Specialties:  Vascular Surgery, Cardiology   Why:  Office will call you to arrange your appt (sent)   Contact information:   [place] [address] [phone]       Follow up with BAUCOM, JENNY B, PA-C. Schedule an appointment as soon as possible for a visit in 2 weeks.   Specialty:  Physician Assistant   Why:  Hospital follow up.   Contact information:   [address] [place] [address] [place]  [address] [phone]       Follow up with [facility] On 12/12/2015.   Why:  12:30 pm       Time coordinating discharge:   Signed:  Maynard David  Pager [phone] [facility] 12/10/2015, 4:43 PM

## 2015-12-12 ENCOUNTER — Telehealth: Payer: Self-pay | Admitting: *Deleted

## 2015-12-12 NOTE — Telephone Encounter (Signed)
Lelan Pons made aware who was with pt. Requested to be seen earlier than September. Scheduled for 8/11.

## 2015-12-12 NOTE — Telephone Encounter (Signed)
Would hold metoprolol for now. May need reduced dose in future.

## 2015-12-12 NOTE — Telephone Encounter (Signed)
Briana [name] with [facility] says pt c/o dizziness with position change. HR 41 BP 140/70. HR has remained at 41 since pt woke up this morning. Denies SOB/chest pain/swelling. Pt is to start dialysis today and wants to know if she needs to make any further med changes for low HR and dizziness. Will forward to Dr. Bronson Ing

## 2015-12-14 ENCOUNTER — Telehealth: Payer: Self-pay | Admitting: Cardiovascular Disease

## 2015-12-14 NOTE — Telephone Encounter (Signed)
LMTCB-cc, pt weight at last visit was 233 lbs on 10/27/15  Home health care states her wt at home was 211 lbs, post dialysis   HR 57 after stopping BB, pt feels amazing

## 2015-12-14 NOTE — Telephone Encounter (Signed)
Per [name] w/ [facility] -pt has had a weight increase to 215lbs, no SOB, no increased swelling--pt has dialysis today. pls call [name] @ [phone]

## 2015-12-15 NOTE — Telephone Encounter (Signed)
LM with [name] at [facility] message

## 2015-12-15 NOTE — Telephone Encounter (Signed)
Volume management per dialysis. She will need to contact nephrology.

## 2015-12-22 ENCOUNTER — Ambulatory Visit (INDEPENDENT_AMBULATORY_CARE_PROVIDER_SITE_OTHER): Payer: Medicare Other | Admitting: Cardiovascular Disease

## 2015-12-22 ENCOUNTER — Encounter: Payer: Self-pay | Admitting: Cardiovascular Disease

## 2015-12-22 VITALS — BP 148/50 | HR 60 | Ht 64.0 in | Wt 211.0 lb

## 2015-12-22 DIAGNOSIS — I25708 Atherosclerosis of coronary artery bypass graft(s), unspecified, with other forms of angina pectoris: Secondary | ICD-10-CM | POA: Diagnosis not present

## 2015-12-22 DIAGNOSIS — I480 Paroxysmal atrial fibrillation: Secondary | ICD-10-CM

## 2015-12-22 DIAGNOSIS — I739 Peripheral vascular disease, unspecified: Secondary | ICD-10-CM

## 2015-12-22 DIAGNOSIS — I10 Essential (primary) hypertension: Secondary | ICD-10-CM

## 2015-12-22 DIAGNOSIS — I5032 Chronic diastolic (congestive) heart failure: Secondary | ICD-10-CM

## 2015-12-22 DIAGNOSIS — Z87898 Personal history of other specified conditions: Secondary | ICD-10-CM

## 2015-12-22 DIAGNOSIS — Z9289 Personal history of other medical treatment: Secondary | ICD-10-CM

## 2015-12-22 DIAGNOSIS — E785 Hyperlipidemia, unspecified: Secondary | ICD-10-CM

## 2015-12-22 DIAGNOSIS — I2510 Atherosclerotic heart disease of native coronary artery without angina pectoris: Secondary | ICD-10-CM

## 2015-12-22 DIAGNOSIS — I779 Disorder of arteries and arterioles, unspecified: Secondary | ICD-10-CM

## 2015-12-22 NOTE — Patient Instructions (Signed)
Your physician wants you to follow-up in: 6 months Dr Virgina Jock will receive a reminder letter in the mail two months in advance. If you don't receive a letter, please call our office to schedule the follow-up appointment.    Your physician recommends that you continue on your current medications as directed. Please refer to the Current Medication list given to you today.    Your physician has requested that you have a carotid duplex. This test is an ultrasound of the carotid arteries in your neck. It looks at blood flow through these arteries that supply the brain with blood. Allow one hour for this exam. There are no restrictions or special instructions.    Thank you for choosing [facility] !

## 2015-12-22 NOTE — Progress Notes (Signed)
SUBJECTIVE: The patient presents for follow-up of coronary artery disease, atrial fibrillation, carotid artery stenosis, hypertension, and chronic diastolic heart failure. She underwent CABG on 05/23/2004 with LIMA to LAD, SVG to OM 2, and SVG to distal RCA.  Echo [id number] normal LV systolic function, EF 0000000, grade 3 diastolic dysfunction with high filling pressures, mild AI, mild MR, moderate TR, severe pulmonary HTN (69 mmHg).  No renal artery stenosis (10/12/15).  Hospitalized in June for a/c diastolic CHF and PAF. On amiodarone. Metoprolol d/c due to bradycardia. Anticoagulated with warfarin.  Dialyzes Tues, Thurs, Fri.  Denies chest pain and SOB.   Review of Systems: As per "subjective", otherwise negative.  Allergies  Allergen Reactions  . Oxycodone Other (See Comments)    hallucinations  . Clonazepam Other (See Comments)    Unknown reaction  . Gemfibrozil Other (See Comments)    Patient is not aware of this allergy but states that she has side effects to a cholesterol medication in the past  . Carbamazepine Other (See Comments)    Reaction to tegretol - loopy  . Macrobid [Nitrofurantoin] Nausea And Vomiting    Current Outpatient Prescriptions  Medication Sig Dispense Refill  . amiodarone (PACERONE) 200 MG tablet Take 1 tablet (200 mg total) by mouth daily. 30 tablet 1  . amLODipine (NORVASC) 10 MG tablet Take 1 tablet (10 mg total) by mouth daily. 30 tablet 3  . aspirin EC 81 MG EC tablet Take 1 tablet (81 mg total) by mouth daily. 30 tablet 1  . atorvastatin (LIPITOR) 80 MG tablet Take 1 tablet (80 mg total) by mouth daily. 30 tablet 3  . calcium acetate (PHOSLO) 667 MG capsule Take 2 capsules (1,334 mg total) by mouth 3 (three) times daily with meals. 180 capsule 1  . cetirizine (ZYRTEC) 10 MG tablet Take 10 mg by mouth at bedtime.    . hydrocortisone (ANUSOL-HC) 2.5 % rectal cream Place rectally 2 (two) times daily as needed for hemorrhoids or itching. 30 g 0    . insulin NPH-regular Human (NOVOLIN 70/30) (70-30) 100 UNIT/ML injection Inject 15 Units into the skin 2 (two) times daily with a meal. 50 mL 0  . nitroGLYCERIN (NITROSTAT) 0.4 MG SL tablet Place 1 tablet (0.4 mg total) under the tongue every 5 (five) minutes as needed for chest pain. Max three pills 45 tablet 0  . polyethylene glycol (MIRALAX / GLYCOLAX) packet Take 17 g by mouth 2 (two) times daily. 14 each 0  . protein supplement (RESOURCE BENEPROTEIN) POWD Take 6 g by mouth 3 (three) times daily with meals.  0  . rOPINIRole (REQUIP) 0.25 MG tablet Take 1 tablet (0.25 mg total) by mouth at bedtime. 30 tablet 3  . sevelamer carbonate (RENVELA) 800 MG tablet Take 800 mg by mouth 3 (three) times daily with meals.    . warfarin (COUMADIN) 5 MG tablet Take 1 tablet (5 mg total) by mouth daily at 6 PM. 30 tablet 0   No current facility-administered medications for this visit.     Past Medical History:  Diagnosis Date  . Adenomatous colon polyp 09/2007  . Arthritis   . Atrial fibrillation with rapid ventricular response ([place]) 10/05/2015  . CAD (coronary artery disease)   . Carotid artery occlusion   . Chronic kidney disease   . Depression with anxiety   . Diabetes mellitus   . Heart murmur   . Hyperlipidemia   . Hypertension   . Neuropathy ([place])   .  PAD (peripheral artery disease) ([place])   . Peripheral vascular disease ([place])   . Rheumatic fever   . Sleep apnea     Past Surgical History:  Procedure Laterality Date  . BASCILIC VEIN TRANSPOSITION Left 12/04/2015   Procedure: BASILIC VEIN TRANSPOSITION;  Surgeon: Rosetta Posner, MD;  Location: [facility];  Service: Vascular;  Laterality: Left;  . CAROTID ENDARTERECTOMY  10/04/2009   Right CEA  . CORONARY ARTERY BYPASS GRAFT  2005  . POLYPECTOMY  02/25/2012   Procedure: POLYPECTOMY;  Surgeon: Danie Binder, MD;  Location: [facility];  Service: Endoscopy;  Laterality: N/A;  . TUBAL LIGATION      Social History   Social History  . Marital  status: Married    Spouse name: N/A  . Number of children: N/A  . Years of education: N/A   Occupational History  . Not on file.   Social History Main Topics  . Smoking status: Never Smoker  . Smokeless tobacco: Never Used  . Alcohol use No  . Drug use: No  . Sexual activity: Not on file   Other Topics Concern  . Not on file   Social History Narrative  . No narrative on file     Vitals:   12/22/15 0937  BP: (!) 148/50  Pulse: 60  SpO2: 97%  Weight: 211 lb (95.7 kg)  Height: 5\' 4"  (1.626 m)    PHYSICAL EXAM General: NAD HEENT: Normal. Neck: No JVD, no thyromegaly. Lungs: Clear to auscultation bilaterally with normal respiratory effort. CV: Nondisplaced PMI. Regular rate and rhythm, normal S1/S2, no [id number], 2/6 systolic murmur loudest along left sternal border. Trace pretibial and periankle edema b/l. Bilateral carotid bruits, R>L.  Abdomen: Soft, nontender, obese.  Neurologic: Alert and oriented x 3.  Psych: Normal affect. Skin: Normal. Musculoskeletal: No gross deformities. Extremities: No clubbing or cyanosis.     ECG: Most recent ECG reviewed.      ASSESSMENT AND PLAN: 1. CAD with h/o 3-v CABG in 04/2004: CCS class I-II exertional angina. Continue ASA and statin therapy. Stress testing in May showed mild peri-infarct ischemia in anterior and anteroseptal walls. No beta blockers due to bradycardia.  2. Essential HTN: Mildly elevated today. No changes.  3. Hyperlipidemia: On Lipitor 80 mg.  4. Left internal carotid artery stenosis and s/p right CEA: Will repeat Dopplers. Continue ASA and statin.  5. Chronic diastolic heart failure: Euvolemic. Volume managed by dialysis.  6. PAF: On amiodarone and warfarin.  Dispo: f/u 6 months.   Kate Sable, M.D., F.A.C.C.

## 2015-12-26 ENCOUNTER — Ambulatory Visit (INDEPENDENT_AMBULATORY_CARE_PROVIDER_SITE_OTHER): Payer: Medicare Other | Admitting: Orthopaedic Surgery

## 2015-12-26 ENCOUNTER — Encounter: Payer: Self-pay | Admitting: Orthopaedic Surgery

## 2015-12-26 VITALS — BP 160/61 | HR 76 | Temp 97.7°F | Ht 64.0 in | Wt 217.8 lb

## 2015-12-26 DIAGNOSIS — M25562 Pain in left knee: Secondary | ICD-10-CM | POA: Diagnosis not present

## 2015-12-26 DIAGNOSIS — M25561 Pain in right knee: Secondary | ICD-10-CM | POA: Diagnosis not present

## 2015-12-26 DIAGNOSIS — M25572 Pain in left ankle and joints of left foot: Secondary | ICD-10-CM

## 2015-12-26 MED ORDER — HYDROCODONE-ACETAMINOPHEN 5-325 MG PO TABS: 1.0000 | ORAL_TABLET | ORAL | 0 refills | Status: DC | PRN

## 2015-12-26 NOTE — Progress Notes (Signed)
CC: Both of my knees are hurting. I would like an injection in both knees.  The patient has had chronic pain and tenderness of both knees for some time.  Injections help.  There is no locking or giving way of the knee.  There is no new trauma. There is no redness or signs of infections.  The knees have a mild effusion and some crepitus.  There is no redness or signs of recent trauma.  Right knee ROM is 0-100  and left knee ROM is 0-105.  Impression:  Chronic pain of the both knees  Return:  2 weeks  PROCEDURE NOTE:  The patient requests injections of both knees, verbal consent was obtained.  The left and right knee were individually prepped appropriately after time out was performed.   Sterile technique was observed and injection of 1 cc of Depo-Medrol 40 mg with several cc's of plain xylocaine. Anesthesia was provided by ethyl chloride and a 20-gauge needle was used to inject each knee area. The injections were tolerated well.  A band aid dressing was applied.  The patient was advised to apply ice later today and tomorrow to the injection sight as needed.  She has left ankle pain.  I will order x-rays of the left foot and ankle at the hospital and see her back in two weeks for this.  Electronically Signed Sanjuana Kava, MD 8/1/20179:43 AM

## 2015-12-27 ENCOUNTER — Ambulatory Visit (HOSPITAL_COMMUNITY)
Admission: RE | Admit: 2015-12-27 | Discharge: 2015-12-27 | Disposition: A | Discharge status: Home / Self Care | Payer: Medicare Other | Source: Ambulatory Visit | Attending: Cardiovascular Disease | Admitting: Cardiovascular Disease

## 2015-12-27 ENCOUNTER — Ambulatory Visit (HOSPITAL_COMMUNITY)
Admission: RE | Admit: 2015-12-27 | Discharge: 2015-12-27 | Disposition: A | Discharge status: Home / Self Care | Payer: Medicare Other | Source: Ambulatory Visit | Attending: Orthopaedic Surgery | Admitting: Orthopaedic Surgery

## 2015-12-27 DIAGNOSIS — M19072 Primary osteoarthritis, left ankle and foot: Secondary | ICD-10-CM | POA: Insufficient documentation

## 2015-12-27 DIAGNOSIS — M795 Residual foreign body in soft tissue: Secondary | ICD-10-CM | POA: Diagnosis not present

## 2015-12-27 DIAGNOSIS — I6523 Occlusion and stenosis of bilateral carotid arteries: Secondary | ICD-10-CM | POA: Diagnosis present

## 2015-12-27 DIAGNOSIS — M7732 Calcaneal spur, left foot: Secondary | ICD-10-CM | POA: Insufficient documentation

## 2015-12-27 DIAGNOSIS — M25572 Pain in left ankle and joints of left foot: Secondary | ICD-10-CM

## 2015-12-27 NOTE — Progress Notes (Signed)
Rec'd phone call from [place] @ Ultrasound @ [facility].  Reported that the pt. Had a carotid duplex that showed a blood clot in the right internal jugular vein.  It was noted to likely be related to prior placement of right IJ by Interventional Radiology 11/29/15.  Reported the abnormal finding to Dr. Scot Dock, in office.  Advised that it is an incidental finding, and since this is her only means of receiving HD at current time, he does not recommend doing anything at this time; the pt. can f/u with Dr. Donnetta Hutching at next appt. scheduled 8/29, for f/u of permanent access left arm.  Informed Megan at Ultrasound Dept. @ [facility] of Dr. Nicole Cella recommendation.  Stated she received a call from [place] with the same recommendation.  The pt. was released from [place] [facility] to go home.

## 2015-12-28 ENCOUNTER — Encounter: Payer: Self-pay | Admitting: *Deleted

## 2015-12-28 ENCOUNTER — Telehealth: Payer: Self-pay | Admitting: *Deleted

## 2015-12-28 DIAGNOSIS — I6523 Occlusion and stenosis of bilateral carotid arteries: Secondary | ICD-10-CM

## 2015-12-28 NOTE — Telephone Encounter (Signed)
Patient notified and order placed. 

## 2015-12-28 NOTE — Telephone Encounter (Signed)
-----   Message from Laurine Blazer, LPN sent at [id number]  8:44 AM EDT -----   ----- Message ----- From: Herminio Commons, MD Sent: 12/28/2015   8:38 AM To: Laurine Blazer, LPN  Refer to vascular surgery.

## 2016-01-05 ENCOUNTER — Ambulatory Visit: Payer: Medicare Other | Admitting: Cardiovascular Disease

## 2016-01-10 ENCOUNTER — Ambulatory Visit (INDEPENDENT_AMBULATORY_CARE_PROVIDER_SITE_OTHER): Payer: Medicare Other | Admitting: Orthopaedic Surgery

## 2016-01-10 ENCOUNTER — Telehealth: Payer: Self-pay | Admitting: Orthopaedic Surgery

## 2016-01-10 ENCOUNTER — Encounter: Payer: Self-pay | Admitting: Orthopaedic Surgery

## 2016-01-10 VITALS — BP 158/58 | HR 63 | Temp 97.5°F | Ht 64.0 in | Wt 215.0 lb

## 2016-01-10 DIAGNOSIS — I2510 Atherosclerotic heart disease of native coronary artery without angina pectoris: Secondary | ICD-10-CM

## 2016-01-10 DIAGNOSIS — M109 Gout, unspecified: Secondary | ICD-10-CM

## 2016-01-10 DIAGNOSIS — M10072 Idiopathic gout, left ankle and foot: Secondary | ICD-10-CM | POA: Diagnosis not present

## 2016-01-10 MED ORDER — HYDROCODONE-ACETAMINOPHEN 5-325 MG PO TABS: 1.0000 | ORAL_TABLET | ORAL | 0 refills | Status: DC | PRN

## 2016-01-10 MED ORDER — PREDNISONE 5 MG (21) PO TBPK: ORAL_TABLET | ORAL | 0 refills | Status: DC

## 2016-01-10 MED ORDER — DICLOFENAC EPOLAMINE 1.3 % TD PTCH: 1.0000 | MEDICATED_PATCH | Freq: Two times a day (BID) | TRANSDERMAL | Status: DC

## 2016-01-10 NOTE — Progress Notes (Signed)
Patient [id number] Briana [name], female DOB:Aug 16, 1945, 70 y.o. [id number]  Chief Complaint  Patient presents with  . Follow-up    BILATERAL KNEES AND LEFT ANKLE PAIN    HPI  Briana [name] is a 69 y.o. female who has bilateral knee pain.  The injections last time helped the knees. She has less pain and swelling. She has no giving way or redness.  She is on dialysis and goes every T-Th-S.  She has developed left foot and ankle pain over the last month.  X-rays done on 12-27-15 were negative for acute injury.  She has some degenerative changes present but no fracture.  She has night time pain in the foot on the left secondary to peripheral neuropathy that is getting worse.  The last two nights she also had cramping.    I will check her for gout.  I have given prednisone to try.  She wanted a Flector patch and I have given Rx for this as well.  HPI  Body mass index is 36.9 kg/m.  ROS  Review of Systems  HENT: Negative for congestion.   Respiratory: Negative for cough and shortness of breath.   Cardiovascular: Positive for leg swelling. Negative for chest pain.  Endocrine: Positive for cold intolerance.  Musculoskeletal: Positive for arthralgias, gait problem, joint swelling and myalgias.  Allergic/Immunologic: Positive for environmental allergies.    Past Medical History:  Diagnosis Date  . Adenomatous colon polyp 09/2007  . Arthritis   . Atrial fibrillation with rapid ventricular response ([place]) 10/05/2015  . CAD (coronary artery disease)   . Carotid artery occlusion   . Chronic kidney disease   . Depression with anxiety   . Diabetes mellitus   . Heart murmur   . Hyperlipidemia   . Hypertension   . Neuropathy ([place])   . PAD (peripheral artery disease) ([place])   . Peripheral vascular disease ([place])   . Rheumatic fever   . Sleep apnea     Past Surgical History:  Procedure Laterality Date  . BASCILIC VEIN TRANSPOSITION Left 12/04/2015   Procedure: BASILIC VEIN  TRANSPOSITION;  Surgeon: Rosetta Posner, MD;  Location: [facility];  Service: Vascular;  Laterality: Left;  . CAROTID ENDARTERECTOMY  10/04/2009   Right CEA  . CORONARY ARTERY BYPASS GRAFT  2005  . POLYPECTOMY  02/25/2012   Procedure: POLYPECTOMY;  Surgeon: Danie Binder, MD;  Location: [facility];  Service: Endoscopy;  Laterality: N/A;  . TUBAL LIGATION      Family History  Problem Relation Age of Onset  . Breast cancer Mother   . Heart disease Father   . Diabetes Father   . Heart disease Brother     Social History Social History  Substance Use Topics  . Smoking status: Never Smoker  . Smokeless tobacco: Never Used  . Alcohol use No    Allergies  Allergen Reactions  . Oxycodone Other (See Comments)    hallucinations  . Clonazepam Other (See Comments)    Unknown reaction  . Gemfibrozil Other (See Comments)    Patient is not aware of this allergy but states that she has side effects to a cholesterol medication in the past  . Carbamazepine Other (See Comments)    Reaction to tegretol - loopy  . Macrobid [Nitrofurantoin] Nausea And Vomiting    Current Outpatient Prescriptions  Medication Sig Dispense Refill  . amiodarone (PACERONE) 200 MG tablet Take 1 tablet (200 mg total) by mouth daily. 30 tablet 1  . amLODipine (NORVASC) 10  MG tablet Take 1 tablet (10 mg total) by mouth daily. 30 tablet 3  . aspirin EC 81 MG EC tablet Take 1 tablet (81 mg total) by mouth daily. 30 tablet 1  . atorvastatin (LIPITOR) 80 MG tablet Take 1 tablet (80 mg total) by mouth daily. 30 tablet 3  . calcium acetate (PHOSLO) 667 MG capsule Take 2 capsules (1,334 mg total) by mouth 3 (three) times daily with meals. 180 capsule 1  . cetirizine (ZYRTEC) 10 MG tablet Take 10 mg by mouth at bedtime.    . hydrocortisone (ANUSOL-HC) 2.5 % rectal cream Place rectally 2 (two) times daily as needed for hemorrhoids or itching. 30 g 0  . insulin NPH-regular Human (NOVOLIN 70/30) (70-30) 100 UNIT/ML injection Inject 15 Units  into the skin 2 (two) times daily with a meal. 50 mL 0  . nitroGLYCERIN (NITROSTAT) 0.4 MG SL tablet Place 1 tablet (0.4 mg total) under the tongue every 5 (five) minutes as needed for chest pain. Max three pills 45 tablet 0  . polyethylene glycol (MIRALAX / GLYCOLAX) packet Take 17 g by mouth 2 (two) times daily. 14 each 0  . protein supplement (RESOURCE BENEPROTEIN) POWD Take 6 g by mouth 3 (three) times daily with meals.  0  . rOPINIRole (REQUIP) 0.25 MG tablet Take 1 tablet (0.25 mg total) by mouth at bedtime. 30 tablet 3  . sevelamer carbonate (RENVELA) 800 MG tablet Take 800 mg by mouth 3 (three) times daily with meals.    . warfarin (COUMADIN) 5 MG tablet Take 1 tablet (5 mg total) by mouth daily at 6 PM. 30 tablet 0  . HYDROcodone-acetaminophen (NORCO/VICODIN) 5-325 MG tablet Take 1 tablet by mouth every 4 (four) hours as needed for moderate pain (Must last 30 days.  Do not take and drive a car or use machinery.). 120 tablet 0  . predniSONE (STERAPRED UNI-PAK 21 TAB) 5 MG (21) TBPK tablet Take 6 pills first day; 5 pills second day; 4 pills third day; 3 pills fourth day; 2 pills next day and 1 pill last day. 21 tablet 0   Current Facility-Administered Medications  Medication Dose Route Frequency Provider Last Rate Last Dose  . diclofenac (FLECTOR) 1.3 % 1 patch  1 patch Transdermal BID Sanjuana Kava, MD         Physical Exam  Blood pressure (!) 158/58, pulse 63, temperature 97.5 F (36.4 C), height 5\' 4"  (1.626 m), weight 215 lb (97.5 kg).  Constitutional: overall normal hygiene, normal nutrition, well developed, normal grooming, normal body habitus. Assistive device:none  Musculoskeletal: gait and station Limp left, muscle tone and strength are normal, no tremors or atrophy is present.  .  Neurological: coordination overall normal.  Deep tendon reflex/nerve stretch intact.  Sensation normal.  Cranial nerves II-XII intact.   Skin:   normal overall no scars, lesions, ulcers or  rashes. No psoriasis.  Psychiatric: Alert and oriented x 3.  Recent memory intact, remote memory unclear.  Normal mood and affect. Well groomed.  Good eye contact.  Cardiovascular: overall no swelling, no varicosities, no edema bilaterally, normal temperatures of the legs and arms, no clubbing, cyanosis and good capillary refill.  Lymphatic: palpation is normal.  The bilateral lower extremity is examined:  Inspection:  Thigh:  Non-tender and no defects  Knee has swelling 1+ effusion.                        Joint tenderness is present  Patient is tender over the medial joint line  Lower Leg:  Has normal appearance and no tenderness or defects  Ankle:  Non-tender and no defects  Foot:  Non-tender and no defects Range of Motion:  Knee:  Range of motion is: 0-105 left and 0 to 110 right                        Crepitus is  present  Ankle:  Range of motion is normal. Strength and Tone:  The bilateral lower extremity has normal strength and tone. Stability:  Knee:  The knee is stable.  Ankle:  The ankle is stable.  The left ankle and foot have slight swelling.  There is no redness.  She has full motion and sensation is intact.  Pulses present.  Right ankle and foot negative.   The patient has been educated about the nature of the problem(s) and counseled on treatment options.  The patient appeared to understand what I have discussed and is in agreement with it.  Encounter Diagnosis  Name Primary?  . Acute gout of left ankle, unspecified cause Yes    PLAN Call if any problems.  Precautions discussed.  Continue current medications.   Return to clinic 1 month  Electronically Signed Sanjuana Kava, MD 8/16/201711:07 AM

## 2016-01-10 NOTE — Telephone Encounter (Signed)
Patient and daughter called to inquire as to whether the prescription for Flector patches was sent to [facility].  Notes indicate Rx was written and given to patient.  Please advise.

## 2016-01-11 ENCOUNTER — Telehealth: Payer: Self-pay | Admitting: *Deleted

## 2016-01-11 NOTE — Telephone Encounter (Signed)
Rx sent to pharmacy on [facility].

## 2016-01-11 NOTE — Telephone Encounter (Signed)
Received call from Evalee Jefferson RN [facility].  She is seeing pt and doesn't know who has been managing coumadin.  Told her we had never checked INR  She called [facility] where pt was suppose to f/u and has not been there. [facility] wants up to manage.  Last INR in hospital on 12/10/15 was 1.85.  [name] will check INR tomorrow and call to [facility] office for orders in my absence.

## 2016-01-11 NOTE — Telephone Encounter (Signed)
Informed [facility] that we have never managed pt's coumadin.  She will check with patient and see if it is managed by PCP.

## 2016-01-11 NOTE — Telephone Encounter (Signed)
Called back; patient's phone not going through.  Message left at alternate contact phone# to return call to notify.

## 2016-01-11 NOTE — Telephone Encounter (Signed)
Needs to speak with you again about a patient

## 2016-01-12 ENCOUNTER — Ambulatory Visit (INDEPENDENT_AMBULATORY_CARE_PROVIDER_SITE_OTHER): Payer: Medicare Other | Admitting: Pharmacist

## 2016-01-12 DIAGNOSIS — I4891 Unspecified atrial fibrillation: Secondary | ICD-10-CM

## 2016-01-12 LAB — POCT INR: INR: 2.7

## 2016-01-16 ENCOUNTER — Encounter: Payer: Self-pay | Admitting: Vascular Surgery

## 2016-01-16 ENCOUNTER — Ambulatory Visit (INDEPENDENT_AMBULATORY_CARE_PROVIDER_SITE_OTHER): Payer: Medicare Other | Admitting: Vascular Surgery

## 2016-01-16 ENCOUNTER — Ambulatory Visit (HOSPITAL_COMMUNITY)
Admission: RE | Admit: 2016-01-16 | Discharge: 2016-01-16 | Disposition: A | Discharge status: Home / Self Care | Payer: Medicare Other | Source: Ambulatory Visit | Attending: Vascular Surgery | Admitting: Vascular Surgery

## 2016-01-16 VITALS — BP 122/67 | HR 101 | Temp 97.8°F | Resp 16 | Ht 64.0 in | Wt 215.0 lb

## 2016-01-16 DIAGNOSIS — I2510 Atherosclerotic heart disease of native coronary artery without angina pectoris: Secondary | ICD-10-CM | POA: Diagnosis not present

## 2016-01-16 DIAGNOSIS — G4730 Sleep apnea, unspecified: Secondary | ICD-10-CM | POA: Insufficient documentation

## 2016-01-16 DIAGNOSIS — E1122 Type 2 diabetes mellitus with diabetic chronic kidney disease: Secondary | ICD-10-CM | POA: Diagnosis not present

## 2016-01-16 DIAGNOSIS — F419 Anxiety disorder, unspecified: Secondary | ICD-10-CM | POA: Insufficient documentation

## 2016-01-16 DIAGNOSIS — T8140XA Infection following a procedure, unspecified, initial encounter: Secondary | ICD-10-CM

## 2016-01-16 DIAGNOSIS — T814XXA Infection following a procedure, initial encounter: Secondary | ICD-10-CM

## 2016-01-16 DIAGNOSIS — F329 Major depressive disorder, single episode, unspecified: Secondary | ICD-10-CM | POA: Insufficient documentation

## 2016-01-16 DIAGNOSIS — Z4931 Encounter for adequacy testing for hemodialysis: Secondary | ICD-10-CM | POA: Insufficient documentation

## 2016-01-16 DIAGNOSIS — I120 Hypertensive chronic kidney disease with stage 5 chronic kidney disease or end stage renal disease: Secondary | ICD-10-CM | POA: Diagnosis not present

## 2016-01-16 DIAGNOSIS — N186 End stage renal disease: Secondary | ICD-10-CM | POA: Diagnosis not present

## 2016-01-16 DIAGNOSIS — E1142 Type 2 diabetes mellitus with diabetic polyneuropathy: Secondary | ICD-10-CM | POA: Insufficient documentation

## 2016-01-16 DIAGNOSIS — E1151 Type 2 diabetes mellitus with diabetic peripheral angiopathy without gangrene: Secondary | ICD-10-CM | POA: Insufficient documentation

## 2016-01-16 DIAGNOSIS — E785 Hyperlipidemia, unspecified: Secondary | ICD-10-CM | POA: Insufficient documentation

## 2016-01-16 DIAGNOSIS — Z992 Dependence on renal dialysis: Secondary | ICD-10-CM

## 2016-01-16 MED ORDER — CEPHALEXIN 500 MG PO CAPS: 500.0000 mg | ORAL_CAPSULE | Freq: Three times a day (TID) | ORAL | 0 refills | Status: DC

## 2016-01-16 NOTE — Progress Notes (Signed)
Patient name: Briana [name] MRN: [id number] DOB: 1945/08/15 Sex: female  REASON FOR VISIT: Follow-up left basilic vein transposition on 12/04/2015  HPI: Briana [name] is a 70 y.o. female patient resents today for a concern regarding some drainage and erythema in the mid upper arm incision of her fistula. Denies any fevers. She has had some drainage from this.  Current Outpatient Prescriptions  Medication Sig Dispense Refill  . amiodarone (PACERONE) 200 MG tablet Take 1 tablet (200 mg total) by mouth daily. 30 tablet 1  . amLODipine (NORVASC) 10 MG tablet Take 1 tablet (10 mg total) by mouth daily. 30 tablet 3  . aspirin EC 81 MG EC tablet Take 1 tablet (81 mg total) by mouth daily. 30 tablet 1  . atorvastatin (LIPITOR) 80 MG tablet Take 1 tablet (80 mg total) by mouth daily. 30 tablet 3  . calcium acetate (PHOSLO) 667 MG capsule Take 2 capsules (1,334 mg total) by mouth 3 (three) times daily with meals. 180 capsule 1  . HYDROcodone-acetaminophen (NORCO/VICODIN) 5-325 MG tablet Take 1 tablet by mouth every 4 (four) hours as needed for moderate pain (Must last 30 days.  Do not take and drive a car or use machinery.). 120 tablet 0  . hydrocortisone (ANUSOL-HC) 2.5 % rectal cream Place rectally 2 (two) times daily as needed for hemorrhoids or itching. 30 g 0  . insulin NPH-regular Human (NOVOLIN 70/30) (70-30) 100 UNIT/ML injection Inject 15 Units into the skin 2 (two) times daily with a meal. 50 mL 0  . nitroGLYCERIN (NITROSTAT) 0.4 MG SL tablet Place 1 tablet (0.4 mg total) under the tongue every 5 (five) minutes as needed for chest pain. Max three pills 45 tablet 0  . polyethylene glycol (MIRALAX / GLYCOLAX) packet Take 17 g by mouth 2 (two) times daily. 14 each 0  . protein supplement (RESOURCE BENEPROTEIN) POWD Take 6 g by mouth 3 (three) times daily with meals.  0  . rOPINIRole (REQUIP) 0.25 MG tablet Take 1 tablet (0.25 mg total) by mouth at  bedtime. 30 tablet 3  . sevelamer carbonate (RENVELA) 800 MG tablet Take 800 mg by mouth 3 (three) times daily with meals.    . warfarin (COUMADIN) 5 MG tablet Take 1 tablet (5 mg total) by mouth daily at 6 PM. 30 tablet 0  . cetirizine (ZYRTEC) 10 MG tablet Take 10 mg by mouth at bedtime.    . predniSONE (STERAPRED UNI-PAK 21 TAB) 5 MG (21) TBPK tablet Take 6 pills first day; 5 pills second day; 4 pills third day; 3 pills fourth day; 2 pills next day and 1 pill last day. (Patient not taking: Reported on 01/16/2016) 21 tablet 0   Current Facility-Administered Medications  Medication Dose Route Frequency Provider Last Rate Last Dose  . diclofenac (FLECTOR) 1.3 % 1 patch  1 patch Transdermal BID Sanjuana Kava, MD         PHYSICAL EXAM: There were no vitals filed for this visit.  GENERAL: The patient is a well-nourished female, in no acute distress. The vital signs are documented above. She has excellent thrill and good size maturation now 5 weeks out from her left upper arm basilic vein fistula tract at transposition creation. Her antecubital Incisions are completely healed. She does have some erythema on the mid upper arm vein harvest incision and the slight amount of drainage but no evidence of fluctuance.  MEDICAL ISSUES: Good size maturation at 5 weeks. Will start Keflex 500 mg 3 times a  day for 10 days. See her again in 2 weeks for continued follow-up   Rosetta Posner, MD [facility] Tel [phone] Pager [phone]

## 2016-01-18 ENCOUNTER — Telehealth: Payer: Self-pay | Admitting: *Deleted

## 2016-01-18 ENCOUNTER — Ambulatory Visit (INDEPENDENT_AMBULATORY_CARE_PROVIDER_SITE_OTHER): Payer: Medicare Other | Admitting: *Deleted

## 2016-01-18 DIAGNOSIS — I4891 Unspecified atrial fibrillation: Secondary | ICD-10-CM

## 2016-01-18 LAB — POCT INR: INR: 2.1

## 2016-01-18 NOTE — Telephone Encounter (Signed)
Can she come in for a nursing visit with vitals check and EKG   Zandra Abts MD

## 2016-01-18 NOTE — Telephone Encounter (Signed)
Briana [name] from [facility] called to report pt's heart rate of 100 today. [name] states that Pt's HR was 101 at Dr. Luther Parody office yesterday. Pt denies any sx at this time. [name] states that Pt's HR is normally around 60.

## 2016-01-19 NOTE — Telephone Encounter (Signed)
Apt 8/29 at 4 pm made,spoke with Baker Janus

## 2016-01-22 ENCOUNTER — Telehealth: Payer: Self-pay | Admitting: *Deleted

## 2016-01-22 NOTE — Telephone Encounter (Signed)
Would like to speak with you regarding patient's Coumadin. / tg

## 2016-01-22 NOTE — Telephone Encounter (Signed)
Spoke to [facility].  Pt has a MD appt and dialysis tomorrow.  She will check INR on Wednesday 01/24/16.

## 2016-01-23 ENCOUNTER — Ambulatory Visit (INDEPENDENT_AMBULATORY_CARE_PROVIDER_SITE_OTHER): Payer: Medicare Other | Admitting: Cardiology

## 2016-01-23 ENCOUNTER — Encounter: Payer: Medicare Other | Admitting: Vascular Surgery

## 2016-01-23 ENCOUNTER — Encounter: Payer: Self-pay | Admitting: Cardiology

## 2016-01-23 ENCOUNTER — Encounter: Payer: Self-pay | Admitting: Vascular Surgery

## 2016-01-23 ENCOUNTER — Other Ambulatory Visit: Payer: Self-pay | Admitting: Cardiology

## 2016-01-23 VITALS — BP 110/58 | HR 109

## 2016-01-23 DIAGNOSIS — I10 Essential (primary) hypertension: Secondary | ICD-10-CM | POA: Diagnosis not present

## 2016-01-23 DIAGNOSIS — N186 End stage renal disease: Secondary | ICD-10-CM

## 2016-01-23 DIAGNOSIS — Z951 Presence of aortocoronary bypass graft: Secondary | ICD-10-CM | POA: Diagnosis not present

## 2016-01-23 DIAGNOSIS — I5032 Chronic diastolic (congestive) heart failure: Secondary | ICD-10-CM

## 2016-01-23 DIAGNOSIS — Z992 Dependence on renal dialysis: Secondary | ICD-10-CM

## 2016-01-23 DIAGNOSIS — I2510 Atherosclerotic heart disease of native coronary artery without angina pectoris: Secondary | ICD-10-CM

## 2016-01-23 MED ORDER — METOPROLOL TARTRATE 25 MG PO TABS: 12.5000 mg | ORAL_TABLET | Freq: Two times a day (BID) | ORAL | 3 refills | Status: DC

## 2016-01-23 NOTE — Assessment & Plan Note (Signed)
Grade 3 DD with normal LVF on echo 10/02/15

## 2016-01-23 NOTE — Assessment & Plan Note (Signed)
Controlled.  

## 2016-01-23 NOTE — Assessment & Plan Note (Signed)
Came from dialysis center

## 2016-01-23 NOTE — Progress Notes (Signed)
01/23/2016 Briana [name]   [birth year]  [id number]  Primary Physician Antionette Fairy, PA-C Primary Cardiologist:  Dr Dwana Curd  HPI:  70 y/o Caucasian female, seen as a work in today for tachycardia, sent from dialysis center. She has a history of coronary artery disease, atrial fibrillation, carotid artery stenosis, hypertension, and chronic diastolic heart failure. She underwent CABG on 05/23/2004 with LIMA to LAD, SVG to OM 2, and SVG to distal RCA. And has been stable from that standpoint. Hospitalized in June for a/c diastolic CHF and PAF. She has been amiodarone. Metoprolol 25 mg BID was d/c due to bradycardia. She is chronically anticoagulated with warfarin. Echo 10/02/15 showed normal LV systolic function, EF 0000000, grade 3 diastolic dysfunction with high filling pressures, mild AI, mild MR, moderate TR, severe pulmonary HTN (69 mmHg).          She called today saying she has noticed increased HR for the past few days. Her HR was > 110 at dialysis. She is tolerating this well. In the office she appears to be in atrial flutter with RBBB. She denies any recent illness. She did have a LUE AVF revision and was treated with ABs but this has stabilized.        Current Outpatient Prescriptions  Medication Sig Dispense Refill  . amiodarone (PACERONE) 200 MG tablet Take 1 tablet (200 mg total) by mouth daily. 30 tablet 1  . amLODipine (NORVASC) 10 MG tablet Take 1 tablet (10 mg total) by mouth daily. 30 tablet 3  . aspirin EC 81 MG EC tablet Take 1 tablet (81 mg total) by mouth daily. 30 tablet 1  . atorvastatin (LIPITOR) 80 MG tablet Take 1 tablet (80 mg total) by mouth daily. 30 tablet 3  . calcium acetate (PHOSLO) 667 MG capsule Take 2 capsules (1,334 mg total) by mouth 3 (three) times daily with meals. 180 capsule 1  . cephALEXin (KEFLEX) 500 MG capsule Take 1 capsule (500 mg total) by mouth 3 (three) times daily. 30 capsule 0  . cetirizine (ZYRTEC) 10 MG tablet Take 10 mg by  mouth at bedtime.    [facility] HYDROcodone-acetaminophen (NORCO/VICODIN) 5-325 MG tablet Take 1 tablet by mouth every 4 (four) hours as needed for moderate pain (Must last 30 days.  Do not take and drive a car or use machinery.). 120 tablet 0  . hydrocortisone (ANUSOL-HC) 2.5 % rectal cream Place rectally 2 (two) times daily as needed for hemorrhoids or itching. 30 g 0  . insulin NPH-regular Human (NOVOLIN 70/30) (70-30) 100 UNIT/ML injection Inject 15 Units into the skin 2 (two) times daily with a meal. 50 mL 0  . metoprolol tartrate (LOPRESSOR) 25 MG tablet Take 0.5 tablets (12.5 mg total) by mouth 2 (two) times daily. 30 tablet 3  . nitroGLYCERIN (NITROSTAT) 0.4 MG SL tablet Place 1 tablet (0.4 mg total) under the tongue every 5 (five) minutes as needed for chest pain. Max three pills 45 tablet 0  . polyethylene glycol (MIRALAX / GLYCOLAX) packet Take 17 g by mouth 2 (two) times daily. 14 each 0  . predniSONE (STERAPRED UNI-PAK 21 TAB) 5 MG (21) TBPK tablet Take 6 pills first day; 5 pills second day; 4 pills third day; 3 pills fourth day; 2 pills next day and 1 pill last day. (Patient not taking: Reported on 01/16/2016) 21 tablet 0  . protein supplement (RESOURCE BENEPROTEIN) POWD Take 6 g by mouth 3 (three) times daily with meals.  0  . rOPINIRole (  REQUIP) 0.25 MG tablet Take 1 tablet (0.25 mg total) by mouth at bedtime. 30 tablet 3  . sevelamer carbonate (RENVELA) 800 MG tablet Take 800 mg by mouth 3 (three) times daily with meals.    . warfarin (COUMADIN) 5 MG tablet Take 1 tablet (5 mg total) by mouth daily at 6 PM. 30 tablet 0   Current Facility-Administered Medications  Medication Dose Route Frequency Provider Last Rate Last Dose  . diclofenac (FLECTOR) 1.3 % 1 patch  1 patch Transdermal BID Sanjuana Kava, MD        Allergies  Allergen Reactions  . Oxycodone Other (See Comments)    hallucinations  . Clonazepam Other (See Comments)    Unknown reaction  . Gemfibrozil Other (See Comments)     Patient is not aware of this allergy but states that she has side effects to a cholesterol medication in the past  . Carbamazepine Other (See Comments)    Reaction to tegretol - loopy  . Macrobid [Nitrofurantoin] Nausea And Vomiting    Social History   Social History  . Marital status: Married    Spouse name: N/A  . Number of children: N/A  . Years of education: N/A   Occupational History  . Not on file.   Social History Main Topics  . Smoking status: Never Smoker  . Smokeless tobacco: Never Used  . Alcohol use No  . Drug use: No  . Sexual activity: Not on file   Other Topics Concern  . Not on file   Social History Narrative  . No narrative on file     Review of Systems: General: negative for chills, fever, night sweats or weight changes.  Cardiovascular: negative for chest pain, dyspnea on exertion, edema, orthopnea, palpitations, paroxysmal nocturnal dyspnea or shortness of breath Dermatological: negative for rash Respiratory: negative for cough or wheezing Urologic: negative for hematuria Abdominal: negative for nausea, vomiting, diarrhea, bright red blood per rectum, melena, or hematemesis Neurologic: negative for visual changes, syncope, or dizziness All other systems reviewed and are otherwise negative except as noted above.    Blood pressure (!) 110/58, pulse (!) 109, SpO2 94 %.  General appearance: alert, cooperative, no distress and moderately obese Lungs: clear to auscultation bilaterally Heart: regular rate and rhythm and increased rate Chest: external dialysis catheter in place Extremities: old ecchymosis LUE Neurologic: Grossly normal  EKG A flutter with RBBB  ASSESSMENT AND PLAN:   Paroxysmal atrial fibrillation (HCC) Seen in the offcie today for tachycardia, she appears to be in atrial flutter with VR 112  Long term current use of anticoagulant therapy On Coumadin  ESRD (end stage renal disease) on dialysis ([place]) Came from dialysis  center  Essential hypertension Controlled  Hx of CABG 2005 No angina  Chronic diastolic heart failure ([place]) Grade 3 DD with normal LVF on echo 10/02/15   PLAN  I suggested we resume low dose beta blocker- Lopressor 12.5 mg BID. Hopefully she will convert back to NSR. She'll need to come to the office in 48 hrs to be re checked.  I explained she is not an optimal candidate for a pacemaker as she is on HD.   Kerin Ransom PA-C 01/23/2016 4:13 PM

## 2016-01-23 NOTE — Telephone Encounter (Signed)
Rx request sent to pharmacy.  

## 2016-01-23 NOTE — Assessment & Plan Note (Signed)
On Coumadin 

## 2016-01-23 NOTE — Assessment & Plan Note (Signed)
No angina 

## 2016-01-23 NOTE — Patient Instructions (Signed)
Medication Instructions:  START TAKING METOPROLOL 12.5 MG - TWO TIMES DAILY  ( 1/2 TABLET- TWICE DAILY )   Labwork: NONE  Testing/Procedures: NONE  Follow-Up: Your physician recommends that you schedule a follow-up appointment in: Friday for a nurse visit to repeat ekg   Any Other Special Instructions Will Be Listed Below (If Applicable).     If you need a refill on your cardiac medications before your next appointment, please call your pharmacy.

## 2016-01-23 NOTE — Assessment & Plan Note (Signed)
Seen in the offcie today for tachycardia, she appears to be in atrial flutter with VR 112

## 2016-01-24 ENCOUNTER — Ambulatory Visit (INDEPENDENT_AMBULATORY_CARE_PROVIDER_SITE_OTHER): Payer: Medicare Other | Admitting: *Deleted

## 2016-01-24 ENCOUNTER — Telehealth: Payer: Self-pay | Admitting: *Deleted

## 2016-01-24 DIAGNOSIS — I4891 Unspecified atrial fibrillation: Secondary | ICD-10-CM

## 2016-01-24 LAB — POCT INR: INR: 3.3

## 2016-01-24 NOTE — Telephone Encounter (Signed)
Done.  See coumadin note. 

## 2016-01-25 ENCOUNTER — Telehealth: Payer: Self-pay | Admitting: Cardiology

## 2016-01-25 NOTE — Telephone Encounter (Signed)
Verified with [facility] hat pt is back on metoprool

## 2016-01-26 ENCOUNTER — Ambulatory Visit (INDEPENDENT_AMBULATORY_CARE_PROVIDER_SITE_OTHER): Payer: Medicare Other

## 2016-01-26 ENCOUNTER — Ambulatory Visit: Payer: Medicare Other | Admitting: Cardiovascular Disease

## 2016-01-26 VITALS — BP 124/58 | HR 94 | Ht 63.0 in | Wt 214.0 lb

## 2016-01-26 DIAGNOSIS — I480 Paroxysmal atrial fibrillation: Secondary | ICD-10-CM

## 2016-01-26 DIAGNOSIS — R000 Tachycardia, unspecified: Secondary | ICD-10-CM

## 2016-01-26 MED ORDER — METOPROLOL TARTRATE 25 MG PO TABS: 25.0000 mg | ORAL_TABLET | Freq: Two times a day (BID) | ORAL | 3 refills | Status: DC

## 2016-01-26 NOTE — Patient Instructions (Addendum)
Your physician recommends that you schedule a follow-up appointment in:  Dr.Koneswaran  On Friday,Sept 8th at 2 pm     INCREASE lopressor to 25 mg twice a day       Thank you for choosing [facility] !

## 2016-01-26 NOTE — Progress Notes (Signed)
EKG obtained,pt in sinus rhythm, HR 100, reviewed by K. Lawrence NP,Increased lopressor to 25 mg twice a day, f/u scheduled for next week with dr Willeen Cass

## 2016-01-30 ENCOUNTER — Other Ambulatory Visit: Payer: Self-pay | Admitting: Cardiology

## 2016-01-30 ENCOUNTER — Ambulatory Visit (INDEPENDENT_AMBULATORY_CARE_PROVIDER_SITE_OTHER): Payer: Medicare Other | Admitting: Vascular Surgery

## 2016-01-30 ENCOUNTER — Encounter: Payer: Self-pay | Admitting: Vascular Surgery

## 2016-01-30 VITALS — BP 117/70 | HR 94 | Temp 97.1°F | Ht 63.0 in | Wt 213.0 lb

## 2016-01-30 DIAGNOSIS — N186 End stage renal disease: Secondary | ICD-10-CM

## 2016-01-30 DIAGNOSIS — I6523 Occlusion and stenosis of bilateral carotid arteries: Secondary | ICD-10-CM

## 2016-01-30 DIAGNOSIS — Z992 Dependence on renal dialysis: Secondary | ICD-10-CM

## 2016-01-30 NOTE — Progress Notes (Signed)
Patient name: Briana [name] MRN: [id number] DOB: 21-Sep-1945 Sex: female  REASON FOR VISIT: Wound check  HPI: Briana [name] is a 70 y.o. female who presents for 2 week follow-up regarding erythema and drainage from her mid upper arm incision of her fistula. She is status post left basilic vein transposition on 12/04/2015. She was prescribed 2 weeks of Keflex. She is currently dialyzing via a right IJ tunneled dialysis catheter.  Regarding her carotid stenosis, she denies any TIA or stroke symptoms. She previously had a right carotid endarterectomy in 2011 by Dr. Kellie Simmering. This typically, she denies amaurosis fugax, sudden onset weakness or numbness of the extremities, expressive or receptive aphasia.  She takes Coumadin for atrial fibrillation.  Current Outpatient Prescriptions  Medication Sig Dispense Refill  . amiodarone (PACERONE) 200 MG tablet Take 1 tablet (200 mg total) by mouth daily. 30 tablet 1  . amLODipine (NORVASC) 10 MG tablet Take 1 tablet (10 mg total) by mouth daily. 30 tablet 3  . aspirin EC 81 MG EC tablet Take 1 tablet (81 mg total) by mouth daily. 30 tablet 1  . atorvastatin (LIPITOR) 80 MG tablet Take 1 tablet (80 mg total) by mouth daily. 30 tablet 3  . calcium acetate (PHOSLO) 667 MG capsule Take 2 capsules (1,334 mg total) by mouth 3 (three) times daily with meals. 180 capsule 1  . cephALEXin (KEFLEX) 500 MG capsule Take 1 capsule (500 mg total) by mouth 3 (three) times daily. 30 capsule 0  . cetirizine (ZYRTEC) 10 MG tablet Take 10 mg by mouth at bedtime.    Marland Kitchen HYDROcodone-acetaminophen (NORCO/VICODIN) 5-325 MG tablet Take 1 tablet by mouth every 4 (four) hours as needed for moderate pain (Must last 30 days.  Do not take and drive a car or use machinery.). 120 tablet 0  . hydrocortisone (ANUSOL-HC) 2.5 % rectal cream Place rectally 2 (two) times daily as needed for hemorrhoids or itching. 30 g 0  . insulin NPH-regular Human (NOVOLIN 70/30) (70-30) 100 UNIT/ML  injection Inject 15 Units into the skin 2 (two) times daily with a meal. 50 mL 0  . metoprolol tartrate (LOPRESSOR) 25 MG tablet Take 1 tablet (25 mg total) by mouth 2 (two) times daily. 180 tablet 3  . nitroGLYCERIN (NITROSTAT) 0.4 MG SL tablet Place 1 tablet (0.4 mg total) under the tongue every 5 (five) minutes as needed for chest pain. Max three pills 45 tablet 0  . polyethylene glycol (MIRALAX / GLYCOLAX) packet Take 17 g by mouth 2 (two) times daily. 14 each 0  . predniSONE (STERAPRED UNI-PAK 21 TAB) 5 MG (21) TBPK tablet Take 6 pills first day; 5 pills second day; 4 pills third day; 3 pills fourth day; 2 pills next day and 1 pill last day. 21 tablet 0  . protein supplement (RESOURCE BENEPROTEIN) POWD Take 6 g by mouth 3 (three) times daily with meals.  0  . rOPINIRole (REQUIP) 0.25 MG tablet Take 1 tablet (0.25 mg total) by mouth at bedtime. 30 tablet 3  . sevelamer carbonate (RENVELA) 800 MG tablet Take 800 mg by mouth 3 (three) times daily with meals.    . warfarin (COUMADIN) 5 MG tablet Take 1 tablet (5 mg total) by mouth daily at 6 PM. 30 tablet 0   Current Facility-Administered Medications  Medication Dose Route Frequency Provider Last Rate Last Dose  . diclofenac (FLECTOR) 1.3 % 1 patch  1 patch Transdermal BID Sanjuana Kava, MD  REVIEW OF SYSTEMS:  [X]  denotes positive finding, [ ]  denotes negative finding Cardiac  Comments:  Chest pain or chest pressure:    Shortness of breath upon exertion:    Short of breath when lying flat:    Irregular heart rhythm:    Constitutional    Fever or chills:      PHYSICAL EXAM: Vitals:   01/30/16 1541  BP: 117/70  Pulse: 94  Temp: 97.1 F (36.2 C)  TempSrc: Oral  SpO2: 91%  Weight: 213 lb (96.6 kg)  Height: 5\' 3"  (1.6 m)    GENERAL: The patient is a well-nourished female, in no acute distress. The vital signs are documented above. CARDIOVASCULAR: Irregularly irregular.No carotid bruits. PULMONARY: There is good air  exchange bilaterally without wheezing or rales. VASCULAR: Left upper arm fistula with easily palpable thrill. Incisions are healed. No erythema or drainage seen.  DATA:  Carotid duplex from 09/29/2015 reviewed. This reveals greater than 70% stenosis of the left internal carotid artery. Velocities of 393/82 cm/s. Right carotid endarterectomy site is patent with less than 50% stenosis.    MEDICAL ISSUES: Status post left basilic vein transposition  The patient's fistula is maturing well. This can be cannulated 3 months from surgery which is 03/05/2016.  Bilateral carotid stenosis  The patient has greater than 70% left internal carotid artery stenosis. She has been asymptomatic. Her right carotid endarterectomy site is patent. Her last carotid duplex was on 09/29/2015. She will follow up in 2 months (6 months from previous duplex) for repeat carotid duplex.  Virgina Jock, PA-C [facility]      I have examined the patient, reviewed and agree with above.  Curt Jews, MD 01/30/2016 4:46 PM

## 2016-01-31 ENCOUNTER — Ambulatory Visit (INDEPENDENT_AMBULATORY_CARE_PROVIDER_SITE_OTHER): Payer: Medicare Other | Admitting: *Deleted

## 2016-01-31 ENCOUNTER — Other Ambulatory Visit: Payer: Self-pay | Admitting: Cardiovascular Disease

## 2016-01-31 ENCOUNTER — Telehealth: Payer: Self-pay | Admitting: *Deleted

## 2016-01-31 DIAGNOSIS — I4891 Unspecified atrial fibrillation: Secondary | ICD-10-CM

## 2016-01-31 LAB — POCT INR: INR: 3.5

## 2016-01-31 MED ORDER — AMIODARONE HCL 200 MG PO TABS: 200.0000 mg | ORAL_TABLET | Freq: Every day | ORAL | 1 refills | Status: DC

## 2016-01-31 NOTE — Telephone Encounter (Signed)
Orders given to Evalee Jefferson RN [facility].  See coumadin note.

## 2016-01-31 NOTE — Telephone Encounter (Signed)
Amiodarone refilled.

## 2016-01-31 NOTE — Telephone Encounter (Signed)
FYI  To MD, has apt in 2 days 02/02/16 at 2 pm

## 2016-02-02 ENCOUNTER — Ambulatory Visit (INDEPENDENT_AMBULATORY_CARE_PROVIDER_SITE_OTHER): Payer: Medicare Other | Admitting: Cardiovascular Disease

## 2016-02-02 ENCOUNTER — Encounter: Payer: Self-pay | Admitting: Cardiovascular Disease

## 2016-02-02 VITALS — BP 110/60 | HR 96 | Ht 63.0 in | Wt 211.0 lb

## 2016-02-02 DIAGNOSIS — I2510 Atherosclerotic heart disease of native coronary artery without angina pectoris: Secondary | ICD-10-CM

## 2016-02-02 DIAGNOSIS — I5032 Chronic diastolic (congestive) heart failure: Secondary | ICD-10-CM

## 2016-02-02 DIAGNOSIS — R000 Tachycardia, unspecified: Secondary | ICD-10-CM | POA: Diagnosis not present

## 2016-02-02 DIAGNOSIS — I10 Essential (primary) hypertension: Secondary | ICD-10-CM

## 2016-02-02 DIAGNOSIS — I6523 Occlusion and stenosis of bilateral carotid arteries: Secondary | ICD-10-CM

## 2016-02-02 DIAGNOSIS — Z951 Presence of aortocoronary bypass graft: Secondary | ICD-10-CM

## 2016-02-02 DIAGNOSIS — I4891 Unspecified atrial fibrillation: Secondary | ICD-10-CM

## 2016-02-02 DIAGNOSIS — I25709 Atherosclerosis of coronary artery bypass graft(s), unspecified, with unspecified angina pectoris: Secondary | ICD-10-CM

## 2016-02-02 MED ORDER — AMIODARONE HCL 200 MG PO TABS: ORAL_TABLET | ORAL | 1 refills | Status: DC

## 2016-02-02 NOTE — Patient Instructions (Signed)
Your physician recommends that you schedule a follow-up appointment in: 1 months with nurse for EKG   2 month f/u with [facility]    Your physician recommends that you continue on your current medications as directed. Please refer to the Current Medication list given to you today.     Thank you for choosing [facility] !       Marland Kitchen

## 2016-02-02 NOTE — Progress Notes (Signed)
SUBJECTIVE: Pt returns for follow up of tachycardia/atrial flutter. Started on low dose metoprolol 12.5 mg BID by L. Kilroy PA-C 01/23/16. HR 100 bpm 9/1, metoprolol increased to 25 mg BID.  Denies palps, CP, and SOB. Stopped taking amiodarone for a few days, which is when tachycardia developed.    Review of Systems: As per "subjective", otherwise negative.  Allergies  Allergen Reactions  . Oxycodone Other (See Comments)    hallucinations  . Clonazepam Other (See Comments)    Unknown reaction  . Gemfibrozil Other (See Comments)    Patient is not aware of this allergy but states that she has side effects to a cholesterol medication in the past  . Carbamazepine Other (See Comments)    Reaction to tegretol - loopy  . Macrobid [Nitrofurantoin] Nausea And Vomiting    Current Outpatient Prescriptions  Medication Sig Dispense Refill  . amiodarone (PACERONE) 200 MG tablet TAKE 1 TABLET(200 MG) BY MOUTH DAILY 90 tablet 1  . amLODipine (NORVASC) 10 MG tablet Take 1 tablet (10 mg total) by mouth daily. 30 tablet 3  . aspirin EC 81 MG EC tablet Take 1 tablet (81 mg total) by mouth daily. 30 tablet 1  . atorvastatin (LIPITOR) 80 MG tablet Take 1 tablet (80 mg total) by mouth daily. 30 tablet 3  . calcium acetate (PHOSLO) 667 MG capsule Take 2 capsules (1,334 mg total) by mouth 3 (three) times daily with meals. 180 capsule 1  . cephALEXin (KEFLEX) 500 MG capsule Take 1 capsule (500 mg total) by mouth 3 (three) times daily. 30 capsule 0  . cetirizine (ZYRTEC) 10 MG tablet Take 10 mg by mouth at bedtime.    Marland Kitchen HYDROcodone-acetaminophen (NORCO/VICODIN) 5-325 MG tablet Take 1 tablet by mouth every 4 (four) hours as needed for moderate pain (Must last 30 days.  Do not take and drive a car or use machinery.). 120 tablet 0  . hydrocortisone (ANUSOL-HC) 2.5 % rectal cream Place rectally 2 (two) times daily as needed for hemorrhoids or itching. 30 g 0  . insulin NPH-regular Human (NOVOLIN 70/30)  (70-30) 100 UNIT/ML injection Inject 15 Units into the skin 2 (two) times daily with a meal. 50 mL 0  . metoprolol tartrate (LOPRESSOR) 25 MG tablet Take 1 tablet (25 mg total) by mouth 2 (two) times daily. 180 tablet 3  . nitroGLYCERIN (NITROSTAT) 0.4 MG SL tablet Place 1 tablet (0.4 mg total) under the tongue every 5 (five) minutes as needed for chest pain. Max three pills 45 tablet 0  . polyethylene glycol (MIRALAX / GLYCOLAX) packet Take 17 g by mouth 2 (two) times daily. 14 each 0  . predniSONE (STERAPRED UNI-PAK 21 TAB) 5 MG (21) TBPK tablet Take 6 pills first day; 5 pills second day; 4 pills third day; 3 pills fourth day; 2 pills next day and 1 pill last day. 21 tablet 0  . protein supplement (RESOURCE BENEPROTEIN) POWD Take 6 g by mouth 3 (three) times daily with meals.  0  . rOPINIRole (REQUIP) 0.25 MG tablet Take 1 tablet (0.25 mg total) by mouth at bedtime. 30 tablet 3  . sevelamer carbonate (RENVELA) 800 MG tablet Take 800 mg by mouth 3 (three) times daily with meals.    . warfarin (COUMADIN) 5 MG tablet Take 1 tablet (5 mg total) by mouth daily at 6 PM. 30 tablet 0   Current Facility-Administered Medications  Medication Dose Route Frequency Provider Last Rate Last Dose  . diclofenac (FLECTOR) 1.3 %  1 patch  1 patch Transdermal BID Sanjuana Kava, MD        Past Medical History:  Diagnosis Date  . Adenomatous colon polyp 09/2007  . Arthritis   . Atrial fibrillation with rapid ventricular response ([facility]) 10/05/2015  . CAD (coronary artery disease)   . Carotid artery occlusion   . Chronic kidney disease   . Depression with anxiety   . Diabetes mellitus   . Heart murmur   . Hyperlipidemia   . Hypertension   . Neuropathy ([place])   . PAD (peripheral artery disease) ([place])   . Peripheral vascular disease ([place])   . Rheumatic fever   . Sleep apnea     Past Surgical History:  Procedure Laterality Date  . BASCILIC VEIN TRANSPOSITION Left 12/04/2015   Procedure: BASILIC VEIN  TRANSPOSITION;  Surgeon: Rosetta Posner, MD;  Location: [facility];  Service: Vascular;  Laterality: Left;  . CAROTID ENDARTERECTOMY  10/04/2009   Right CEA  . CORONARY ARTERY BYPASS GRAFT  2005  . POLYPECTOMY  02/25/2012   Procedure: POLYPECTOMY;  Surgeon: Danie Binder, MD;  Location: [facility];  Service: Endoscopy;  Laterality: N/A;  . TUBAL LIGATION      Social History   Social History  . Marital status: Married    Spouse name: N/A  . Number of children: N/A  . Years of education: N/A   Occupational History  . Not on file.   Social History Main Topics  . Smoking status: Never Smoker  . Smokeless tobacco: Never Used  . Alcohol use No  . Drug use: No  . Sexual activity: Not on file   Other Topics Concern  . Not on file   Social History Narrative  . No narrative on file     Vitals:   02/02/16 1337  BP: 110/60  Pulse: 96  SpO2: 95%  Weight: 211 lb (95.7 kg)  Height: 5\' 3"  (1.6 m)   HR: 88 bpm by auscultation.  PHYSICAL EXAM General: NAD HEENT: Normal. Neck: No JVD, no thyromegaly. Lungs: Clear to auscultation bilaterally with normal respiratory effort. CV: Nondisplaced PMI. Regular rate and rhythm, normal S1/S2, no [id number], 2/6 systolic murmur loudest along left sternal border. Trace pretibial and periankle edema b/l. Bilateral carotid bruits, R>L.  Abdomen: Soft, nontender, obese.  Neurologic: Alert and oriented x 3.  Psych: Normal affect. Skin: Normal. Musculoskeletal: No gross deformities. Extremities: No clubbing or cyanosis.     ECG: Most recent ECG reviewed.      ASSESSMENT AND PLAN: 1. CAD with h/o 3-v CABG in 04/2004: CCS class I-II exertional angina. Continue ASA and statin therapy. Stress testing in May showed mild peri-infarct ischemia in anterior and anteroseptal walls.   2. Essential HTN: Controlled. No changes.  3. Hyperlipidemia: On Lipitor 80 mg.  4. Left internal carotid artery stenosis and s/p right CEA: Dopplers 8/2 [id number]  proximal LICA stenosis and 0000000 proximal RICA stenosis. Continue ASA and statin. Will make vascular surgery referral at next visit.  5. Chronic diastolic heart failure: Euvolemic. Volume managed by dialysis.  6. PAF/tachycardia/atrial flutter: On amiodarone, metoprolol, and warfarin. Will obtain ECG in one month.  Dispo: f/u 2 months with me. ECG in one month with nurse.   Kate Sable, M.D., F.A.C.C.

## 2016-02-07 ENCOUNTER — Encounter: Payer: Self-pay | Admitting: Orthopaedic Surgery

## 2016-02-07 ENCOUNTER — Ambulatory Visit (INDEPENDENT_AMBULATORY_CARE_PROVIDER_SITE_OTHER): Payer: Medicare Other | Admitting: Orthopaedic Surgery

## 2016-02-07 ENCOUNTER — Ambulatory Visit (INDEPENDENT_AMBULATORY_CARE_PROVIDER_SITE_OTHER): Payer: Medicare Other | Admitting: Interventional Cardiology

## 2016-02-07 VITALS — BP 96/48 | HR 92 | Ht 64.5 in | Wt 213.0 lb

## 2016-02-07 DIAGNOSIS — M25572 Pain in left ankle and joints of left foot: Secondary | ICD-10-CM

## 2016-02-07 DIAGNOSIS — I2510 Atherosclerotic heart disease of native coronary artery without angina pectoris: Secondary | ICD-10-CM | POA: Diagnosis not present

## 2016-02-07 DIAGNOSIS — I4891 Unspecified atrial fibrillation: Secondary | ICD-10-CM

## 2016-02-07 LAB — POCT INR: INR: 3.7

## 2016-02-07 MED ORDER — HYDROCODONE-ACETAMINOPHEN 5-325 MG PO TABS: 1.0000 | ORAL_TABLET | Freq: Four times a day (QID) | ORAL | 0 refills | Status: DC | PRN

## 2016-02-07 NOTE — Progress Notes (Signed)
Patient Briana [name]:[id number] [name], female DOB:July 08, 1945, 70 y.o. [id number]  Chief Complaint  Patient presents with  . Follow-up    LEFT ANKLE PAIN    HPI  Briana [name] is a 70 y.o. female who has had pain in the left foot and gout. She has pain more of the left ankle today.  She had oral prednisone which helped the foot.  She is on dialysis three times a week now. She has no new trauma, no redness, no swelling. HPI  Body mass index is 36 kg/m.  ROS  Review of Systems  HENT: Negative for congestion.   Respiratory: Negative for cough and shortness of breath.   Cardiovascular: Positive for leg swelling. Negative for chest pain.  Endocrine: Positive for cold intolerance.  Musculoskeletal: Positive for arthralgias, gait problem, joint swelling and myalgias.  Allergic/Immunologic: Positive for environmental allergies.    Past Medical History:  Diagnosis Date  . Adenomatous colon polyp 09/2007  . Arthritis   . Atrial fibrillation with rapid ventricular response ([facility]) 10/05/2015  . CAD (coronary artery disease)   . Carotid artery occlusion   . Chronic kidney disease   . Depression with anxiety   . Diabetes mellitus   . Heart murmur   . Hyperlipidemia   . Hypertension   . Neuropathy ([place])   . PAD (peripheral artery disease) ([place])   . Peripheral vascular disease ([place] [facility])   . Rheumatic fever   . Sleep apnea     Past Surgical History:  Procedure Laterality Date  . BASCILIC VEIN TRANSPOSITION Left 12/04/2015   Procedure: BASILIC VEIN TRANSPOSITION;  Surgeon: Rosetta Posner, MD;  Location: [facility];  Service: Vascular;  Laterality: Left;  . CAROTID ENDARTERECTOMY  10/04/2009   Right CEA  . CORONARY ARTERY BYPASS GRAFT  2005  . POLYPECTOMY  02/25/2012   Procedure: POLYPECTOMY;  Surgeon: Danie Binder, MD;  Location: [facility];  Service: Endoscopy;  Laterality: N/A;  . TUBAL LIGATION      Family History  Problem Relation Age of Onset  . Breast cancer Mother   . Heart disease  Father   . Diabetes Father   . Heart disease Brother     Social History Social History  Substance Use Topics  . Smoking status: Never Smoker  . Smokeless tobacco: Never Used  . Alcohol use No    Allergies  Allergen Reactions  . Oxycodone Other (See Comments)    hallucinations  . Clonazepam Other (See Comments)    Unknown reaction  . Gemfibrozil Other (See Comments)    Patient is not aware of this allergy but states that she has side effects to a cholesterol medication in the past  . Carbamazepine Other (See Comments)    Reaction to tegretol - loopy  . Macrobid [Nitrofurantoin] Nausea And Vomiting    Current Outpatient Prescriptions  Medication Sig Dispense Refill  . amiodarone (PACERONE) 200 MG tablet TAKE 1 TABLET(200 MG) BY MOUTH DAILY 90 tablet 1  . amLODipine (NORVASC) 10 MG tablet Take 1 tablet (10 mg total) by mouth daily. 30 tablet 3  . aspirin EC 81 MG EC tablet Take 1 tablet (81 mg total) by mouth daily. 30 tablet 1  . atorvastatin (LIPITOR) 80 MG tablet Take 1 tablet (80 mg total) by mouth daily. 30 tablet 3  . calcium acetate (PHOSLO) 667 MG capsule Take 2 capsules (1,334 mg total) by mouth 3 (three) times daily with meals. 180 capsule 1  . cephALEXin (KEFLEX) 500 MG capsule Take 1  capsule (500 mg total) by mouth 3 (three) times daily. 30 capsule 0  . cetirizine (ZYRTEC) 10 MG tablet Take 10 mg by mouth at bedtime.    Marland Kitchen HYDROcodone-acetaminophen (NORCO/VICODIN) 5-325 MG tablet Take 1 tablet by mouth every 6 (six) hours as needed for moderate pain (Must last 30 days.Do not take and drive a car or use machinery.). 110 tablet 0  . hydrocortisone (ANUSOL-HC) 2.5 % rectal cream Place rectally 2 (two) times daily as needed for hemorrhoids or itching. 30 g 0  . insulin NPH-regular Human (NOVOLIN 70/30) (70-30) 100 UNIT/ML injection Inject 15 Units into the skin 2 (two) times daily with a meal. 50 mL 0  . metoprolol tartrate (LOPRESSOR) 25 MG tablet Take 1 tablet (25 mg  total) by mouth 2 (two) times daily. 180 tablet 3  . nitroGLYCERIN (NITROSTAT) 0.4 MG SL tablet Place 1 tablet (0.4 mg total) under the tongue every 5 (five) minutes as needed for chest pain. Max three pills 45 tablet 0  . polyethylene glycol (MIRALAX / GLYCOLAX) packet Take 17 g by mouth 2 (two) times daily. 14 each 0  . predniSONE (STERAPRED UNI-PAK 21 TAB) 5 MG (21) TBPK tablet Take 6 pills first day; 5 pills second day; 4 pills third day; 3 pills fourth day; 2 pills next day and 1 pill last day. 21 tablet 0  . protein supplement (RESOURCE BENEPROTEIN) POWD Take 6 g by mouth 3 (three) times daily with meals.  0  . rOPINIRole (REQUIP) 0.25 MG tablet Take 1 tablet (0.25 mg total) by mouth at bedtime. 30 tablet 3  . sevelamer carbonate (RENVELA) 800 MG tablet Take 800 mg by mouth 3 (three) times daily with meals.    . warfarin (COUMADIN) 5 MG tablet Take 1 tablet (5 mg total) by mouth daily at 6 PM. 30 tablet 0   Current Facility-Administered Medications  Medication Dose Route Frequency Provider Last Rate Last Dose  . diclofenac (FLECTOR) 1.3 % 1 patch  1 patch Transdermal BID Sanjuana Kava, MD         Physical Exam  Blood pressure (!) 96/48, pulse 92, height 5' 4.5" (1.638 m), weight 213 lb (96.6 kg).  Constitutional: overall normal hygiene, normal nutrition, well developed, normal grooming, normal body habitus. Assistive device:none  Musculoskeletal: gait and station Limp left, muscle tone and strength are normal, no tremors or atrophy is present.  .  Neurological: coordination overall normal.  Deep tendon reflex/nerve stretch intact.  Sensation normal.  Cranial nerves II-XII intact.   Skin:   Normal overall no scars, lesions, ulcers or rashes. No psoriasis.  Psychiatric: Alert and oriented x 3.  Recent memory intact, remote memory unclear.  Normal mood and affect. Well groomed.  Good eye contact.  Cardiovascular: overall no swelling, no varicosities, no edema bilaterally, normal  temperatures of the legs and arms, no clubbing, cyanosis and good capillary refill.  Lymphatic: palpation is normal.  The left ankle has some pain and tenderness but full ROM.  NV intact.  There is no redness.  The patient has been educated about the nature of the problem(s) and counseled on treatment options.  The patient appeared to understand what I have discussed and is in agreement with it.  Encounter Diagnoses  Name Primary?  . Pain in joint, ankle and foot, left Yes  . Left ankle pain    Procedure note  After sterile prep and drape and permission from the patient, the left ankle was injected with 1% Xylocaine and 1  cc of DepoMedrol 40 by sterile technique tolerated well.  PLAN Call if any problems.  Precautions discussed.  Continue current medications.   Return to clinic 1 month   Electronically Signed Sanjuana Kava, MD 9/13/201710:15 AM

## 2016-02-12 ENCOUNTER — Ambulatory Visit: Payer: Medicare Other | Admitting: Cardiovascular Disease

## 2016-02-14 ENCOUNTER — Ambulatory Visit (INDEPENDENT_AMBULATORY_CARE_PROVIDER_SITE_OTHER): Payer: Medicare Other | Admitting: *Deleted

## 2016-02-14 DIAGNOSIS — I4891 Unspecified atrial fibrillation: Secondary | ICD-10-CM

## 2016-02-14 LAB — POCT INR: INR: 1.4

## 2016-02-21 ENCOUNTER — Ambulatory Visit (INDEPENDENT_AMBULATORY_CARE_PROVIDER_SITE_OTHER): Payer: Medicare Other | Admitting: Interventional Cardiology

## 2016-02-21 DIAGNOSIS — I4891 Unspecified atrial fibrillation: Secondary | ICD-10-CM

## 2016-02-21 LAB — POCT INR: INR: 1.3

## 2016-02-28 ENCOUNTER — Ambulatory Visit (INDEPENDENT_AMBULATORY_CARE_PROVIDER_SITE_OTHER): Payer: Medicare Other | Admitting: *Deleted

## 2016-02-28 DIAGNOSIS — I4891 Unspecified atrial fibrillation: Secondary | ICD-10-CM

## 2016-02-28 LAB — POCT INR: INR: 3.1

## 2016-03-01 ENCOUNTER — Telehealth: Payer: Self-pay

## 2016-03-01 ENCOUNTER — Ambulatory Visit (INDEPENDENT_AMBULATORY_CARE_PROVIDER_SITE_OTHER): Payer: Medicare Other

## 2016-03-01 VITALS — BP 112/64 | HR 96 | Ht 66.0 in | Wt 211.0 lb

## 2016-03-01 DIAGNOSIS — I480 Paroxysmal atrial fibrillation: Secondary | ICD-10-CM

## 2016-03-01 MED ORDER — METOPROLOL TARTRATE 50 MG PO TABS: 50.0000 mg | ORAL_TABLET | Freq: Two times a day (BID) | ORAL | 3 refills | Status: DC

## 2016-03-01 NOTE — Progress Notes (Signed)
EKG done today ,scanned into Epic,copy to Dr Bronson Ing in [place] office

## 2016-03-01 NOTE — Progress Notes (Signed)
Increase metoprolol to 50mg BID

## 2016-03-01 NOTE — Patient Instructions (Signed)
We will call you if Dr Bronson Ing makes any medication changes     Thank you for choosing [facility] !

## 2016-03-01 NOTE — Telephone Encounter (Signed)
-----   Message from Herminio Commons, MD sent at 03/01/2016  4:19 PM EDT ----- Regarding: RE: ekg ST with BBB Increase metoprolol to 50 mg BID.  ----- Message ----- From: Bernita Raisin, RN Sent: 03/01/2016   3:59 PM To: Herminio Commons, MD Subject: ekg ST with BBB                                See Epic for EKG done today at nurse visit

## 2016-03-01 NOTE — Telephone Encounter (Signed)
LM on pt's home phone that lopresssor is increased,escribed rx

## 2016-03-06 ENCOUNTER — Ambulatory Visit (INDEPENDENT_AMBULATORY_CARE_PROVIDER_SITE_OTHER): Payer: Medicare Other | Admitting: *Deleted

## 2016-03-06 ENCOUNTER — Ambulatory Visit (INDEPENDENT_AMBULATORY_CARE_PROVIDER_SITE_OTHER): Payer: Medicare Other | Admitting: Orthopaedic Surgery

## 2016-03-06 ENCOUNTER — Telehealth: Payer: Self-pay | Admitting: *Deleted

## 2016-03-06 ENCOUNTER — Encounter: Payer: Self-pay | Admitting: Orthopaedic Surgery

## 2016-03-06 VITALS — BP 113/63 | HR 87 | Temp 97.3°F | Ht 64.5 in | Wt 213.0 lb

## 2016-03-06 DIAGNOSIS — M25572 Pain in left ankle and joints of left foot: Secondary | ICD-10-CM

## 2016-03-06 DIAGNOSIS — E1042 Type 1 diabetes mellitus with diabetic polyneuropathy: Secondary | ICD-10-CM

## 2016-03-06 DIAGNOSIS — I4891 Unspecified atrial fibrillation: Secondary | ICD-10-CM

## 2016-03-06 DIAGNOSIS — I2510 Atherosclerotic heart disease of native coronary artery without angina pectoris: Secondary | ICD-10-CM

## 2016-03-06 LAB — POCT INR: INR: 3.2

## 2016-03-06 MED ORDER — HYDROCODONE-ACETAMINOPHEN 5-325 MG PO TABS: 1.0000 | ORAL_TABLET | Freq: Four times a day (QID) | ORAL | 0 refills | Status: DC | PRN

## 2016-03-06 NOTE — Telephone Encounter (Signed)
Done.  See coumadin note. 

## 2016-03-06 NOTE — Progress Notes (Signed)
Patient [id number] Briana [name], female DOB:05/04/1946, 70 y.o. [id number]  Chief Complaint  Patient presents with  . Follow-up    4 week follow up left ankle    HPI  Briana RHEDA [name] is a 70 y.o. female who has chronic pain of the left anterior ankle.  The injection I did last time did not help that much.  She has marked peripheral neuropathy of the lower legs. She is on dialysis.  I have given Rx today for Flector patches.  She has no new trauma, no redness. HPI  Body mass index is 36 kg/m.  ROS  Review of Systems  HENT: Negative for congestion.   Respiratory: Negative for cough and shortness of breath.   Cardiovascular: Positive for leg swelling. Negative for chest pain.  Endocrine: Positive for cold intolerance.  Musculoskeletal: Positive for arthralgias, gait problem, joint swelling and myalgias.  Allergic/Immunologic: Positive for environmental allergies.    Past Medical History:  Diagnosis Date  . Adenomatous colon polyp 09/2007  . Arthritis   . Atrial fibrillation with rapid ventricular response ([place]) 10/05/2015  . CAD (coronary artery disease)   . Carotid artery occlusion   . Chronic kidney disease   . Depression with anxiety   . Diabetes mellitus   . Heart murmur   . Hyperlipidemia   . Hypertension   . Neuropathy ([place])   . PAD (peripheral artery disease) ([place])   . Peripheral vascular disease ([place])   . Rheumatic fever   . Sleep apnea     Past Surgical History:  Procedure Laterality Date  . BASCILIC VEIN TRANSPOSITION Left 12/04/2015   Procedure: BASILIC VEIN TRANSPOSITION;  Surgeon: Rosetta Posner, MD;  Location: [facility];  Service: Vascular;  Laterality: Left;  . CAROTID ENDARTERECTOMY  10/04/2009   Right CEA  . CORONARY ARTERY BYPASS GRAFT  2005  . POLYPECTOMY  02/25/2012   Procedure: POLYPECTOMY;  Surgeon: Danie Binder, MD;  Location: [facility];  Service: Endoscopy;  Laterality: N/A;  . TUBAL LIGATION      Family History  Problem Relation Age of Onset   . Breast cancer Mother   . Heart disease Father   . Diabetes Father   . Heart disease Brother     Social History Social History  Substance Use Topics  . Smoking status: Never Smoker  . Smokeless tobacco: Never Used  . Alcohol use No    Allergies  Allergen Reactions  . Oxycodone Other (See Comments)    hallucinations  . Clonazepam Other (See Comments)    Unknown reaction  . Gemfibrozil Other (See Comments)    Patient is not aware of this allergy but states that she has side effects to a cholesterol medication in the past  . Carbamazepine Other (See Comments)    Reaction to tegretol - loopy  . Macrobid [Nitrofurantoin] Nausea And Vomiting    Current Outpatient Prescriptions  Medication Sig Dispense Refill  . amiodarone (PACERONE) 200 MG tablet TAKE 1 TABLET(200 MG) BY MOUTH DAILY 90 tablet 1  . amLODipine (NORVASC) 10 MG tablet Take 1 tablet (10 mg total) by mouth daily. 30 tablet 3  . aspirin EC 81 MG EC tablet Take 1 tablet (81 mg total) by mouth daily. 30 tablet 1  . atorvastatin (LIPITOR) 80 MG tablet Take 1 tablet (80 mg total) by mouth daily. 30 tablet 3  . calcium acetate (PHOSLO) 667 MG capsule Take 2 capsules (1,334 mg total) by mouth 3 (three) times daily with meals. 180 capsule 1  .  cephALEXin (KEFLEX) 500 MG capsule Take 1 capsule (500 mg total) by mouth 3 (three) times daily. 30 capsule 0  . cetirizine (ZYRTEC) 10 MG tablet Take 10 mg by mouth at bedtime.    Marland Kitchen HYDROcodone-acetaminophen (NORCO/VICODIN) 5-325 MG tablet Take 1 tablet by mouth every 6 (six) hours as needed for moderate pain (Must last 30 days.Do not take and drive a car or use machinery.). 110 tablet 0  . hydrocortisone (ANUSOL-HC) 2.5 % rectal cream Place rectally 2 (two) times daily as needed for hemorrhoids or itching. 30 g 0  . insulin NPH-regular Human (NOVOLIN 70/30) (70-30) 100 UNIT/ML injection Inject 15 Units into the skin 2 (two) times daily with a meal. 50 mL 0  . metoprolol (LOPRESSOR) 50  MG tablet Take 1 tablet (50 mg total) by mouth 2 (two) times daily. 180 tablet 3  . nitroGLYCERIN (NITROSTAT) 0.4 MG SL tablet Place 1 tablet (0.4 mg total) under the tongue every 5 (five) minutes as needed for chest pain. Max three pills 45 tablet 0  . polyethylene glycol (MIRALAX / GLYCOLAX) packet Take 17 g by mouth 2 (two) times daily. 14 each 0  . predniSONE (STERAPRED UNI-PAK 21 TAB) 5 MG (21) TBPK tablet Take 6 pills first day; 5 pills second day; 4 pills third day; 3 pills fourth day; 2 pills next day and 1 pill last day. 21 tablet 0  . protein supplement (RESOURCE BENEPROTEIN) POWD Take 6 g by mouth 3 (three) times daily with meals.  0  . rOPINIRole (REQUIP) 0.25 MG tablet Take 1 tablet (0.25 mg total) by mouth at bedtime. 30 tablet 3  . sevelamer carbonate (RENVELA) 800 MG tablet Take 800 mg by mouth 3 (three) times daily with meals.    . warfarin (COUMADIN) 5 MG tablet Take 1 tablet (5 mg total) by mouth daily at 6 PM. 30 tablet 0   Current Facility-Administered Medications  Medication Dose Route Frequency Provider Last Rate Last Dose  . diclofenac (FLECTOR) 1.3 % 1 patch  1 patch Transdermal BID Sanjuana Kava, MD         Physical Exam  Blood pressure 113/63, pulse 87, temperature 97.3 F (36.3 C), height 5' 4.5" (1.638 m), weight 213 lb (96.6 kg).  Constitutional: overall normal hygiene, normal nutrition, well developed, normal grooming, normal body habitus. Assistive device:cane  Musculoskeletal: gait and station Limp left, muscle tone and strength are normal, no tremors or atrophy is present.  .  Neurological: coordination overall normal.  Deep tendon reflex/nerve stretch intact.  Sensation normal.  Cranial nerves II-XII intact.   Skin:   Normal overall no scars, lesions, ulcers or rashes. No psoriasis.  Psychiatric: Alert and oriented x 3.  Recent memory intact, remote memory unclear.  Normal mood and affect. Well groomed.  Good eye contact.  Cardiovascular: overall no  swelling, no varicosities, no edema bilaterally, normal temperatures of the legs and arms, no clubbing, cyanosis and good capillary refill.  Lymphatic: palpation is normal.  Her left ankle is tender anteriorly near the tibialis anterior.  She has no swelling, no redness.  She has full motion of the ankle on the left.  The patient has been educated about the nature of the problem(s) and counseled on treatment options.  The patient appeared to understand what I have discussed and is in agreement with it.  Encounter Diagnoses  Name Primary?  . Pain in joint, ankle and foot, left Yes  . Diabetic peripheral neuropathy associated with type 1 diabetes mellitus ([place])  PLAN Call if any problems.  Precautions discussed.  Continue current medications.   Return to clinic 1 month   Electronically Signed Sanjuana Kava, MD 10/11/201711:12 AM

## 2016-03-06 NOTE — Telephone Encounter (Signed)
INR 3.2 / please call with instructions/tg  °

## 2016-03-13 ENCOUNTER — Ambulatory Visit (INDEPENDENT_AMBULATORY_CARE_PROVIDER_SITE_OTHER): Payer: Medicare Other | Admitting: *Deleted

## 2016-03-13 ENCOUNTER — Telehealth: Payer: Self-pay | Admitting: *Deleted

## 2016-03-13 DIAGNOSIS — I4891 Unspecified atrial fibrillation: Secondary | ICD-10-CM

## 2016-03-13 LAB — POCT INR: INR: 4.1

## 2016-03-13 NOTE — Telephone Encounter (Signed)
Done.  See coumadin note. 

## 2016-03-13 NOTE — Telephone Encounter (Signed)
INR - 4.1 / please call with instructions / tg

## 2016-03-20 ENCOUNTER — Ambulatory Visit (INDEPENDENT_AMBULATORY_CARE_PROVIDER_SITE_OTHER): Payer: Medicare Other | Admitting: *Deleted

## 2016-03-20 DIAGNOSIS — I4891 Unspecified atrial fibrillation: Secondary | ICD-10-CM

## 2016-03-20 LAB — POCT INR: INR: 2.5

## 2016-03-27 ENCOUNTER — Ambulatory Visit (INDEPENDENT_AMBULATORY_CARE_PROVIDER_SITE_OTHER): Payer: Medicare Other | Admitting: *Deleted

## 2016-03-27 DIAGNOSIS — I4891 Unspecified atrial fibrillation: Secondary | ICD-10-CM

## 2016-03-27 LAB — POCT INR: INR: 2.1

## 2016-04-03 ENCOUNTER — Ambulatory Visit (INDEPENDENT_AMBULATORY_CARE_PROVIDER_SITE_OTHER): Payer: Medicare Other | Admitting: *Deleted

## 2016-04-03 ENCOUNTER — Telehealth: Payer: Self-pay | Admitting: *Deleted

## 2016-04-03 ENCOUNTER — Telehealth: Payer: Self-pay | Admitting: Cardiovascular Disease

## 2016-04-03 DIAGNOSIS — I4891 Unspecified atrial fibrillation: Secondary | ICD-10-CM

## 2016-04-03 LAB — POCT INR: INR: 1.8

## 2016-04-03 MED ORDER — AMIODARONE HCL 200 MG PO TABS: ORAL_TABLET | ORAL | 1 refills | Status: DC

## 2016-04-03 NOTE — Telephone Encounter (Signed)
INR - 1.8 / please call with instructions / tg

## 2016-04-03 NOTE — Telephone Encounter (Signed)
Done.  See coumadin note. 

## 2016-04-03 NOTE — Telephone Encounter (Signed)
1. Which medications need to be refilled? (please list name of each medication and dose if known)   amiodarone (PACERONE) 200 MG tablet    2. Which pharmacy/location (including street and city if local pharmacy) is medication to be sent to?  [place]  3. Do they need a 30 day or 90 day supply?

## 2016-04-03 NOTE — Telephone Encounter (Signed)
HR is 96 regular

## 2016-04-10 ENCOUNTER — Ambulatory Visit (INDEPENDENT_AMBULATORY_CARE_PROVIDER_SITE_OTHER): Payer: Medicare Other | Admitting: *Deleted

## 2016-04-10 ENCOUNTER — Encounter: Payer: Self-pay | Admitting: Vascular Surgery

## 2016-04-10 DIAGNOSIS — I4891 Unspecified atrial fibrillation: Secondary | ICD-10-CM

## 2016-04-10 LAB — POCT INR: INR: 1.6

## 2016-04-15 ENCOUNTER — Ambulatory Visit: Payer: Medicare Other | Admitting: Cardiovascular Disease

## 2016-04-16 ENCOUNTER — Ambulatory Visit (HOSPITAL_COMMUNITY)
Admission: RE | Admit: 2016-04-16 | Discharge: 2016-04-16 | Disposition: A | Discharge status: Home / Self Care | Payer: Medicare Other | Source: Ambulatory Visit | Attending: Vascular Surgery | Admitting: Vascular Surgery

## 2016-04-16 ENCOUNTER — Encounter: Payer: Self-pay | Admitting: Vascular Surgery

## 2016-04-16 ENCOUNTER — Ambulatory Visit (INDEPENDENT_AMBULATORY_CARE_PROVIDER_SITE_OTHER): Payer: Medicare Other | Admitting: Vascular Surgery

## 2016-04-16 VITALS — BP 130/68 | HR 94 | Temp 98.4°F | Resp 24 | Ht 64.0 in | Wt 219.0 lb

## 2016-04-16 DIAGNOSIS — I6523 Occlusion and stenosis of bilateral carotid arteries: Secondary | ICD-10-CM | POA: Diagnosis not present

## 2016-04-16 DIAGNOSIS — I2510 Atherosclerotic heart disease of native coronary artery without angina pectoris: Secondary | ICD-10-CM

## 2016-04-16 NOTE — Progress Notes (Signed)
Vascular and Vein Specialist of [facility]  Patient name: Briana [name] MRN: [id number] DOB: 04/25/1946 Sex: female  REASON FOR VISIT: Follow-up asymptomatic carotid disease and left arm basilic vein transposition fistula  HPI: Briana [name] is a 70 y.o. female here today with her daughter for follow-up. She has a remote history of right carotid endarterectomy with Dr. Kellie Simmering and has had a duplex showing asymptomatic moderate to severe left carotid stenosis. She did undergo vein transposition fistula by myself in July 2017. She is being dialyzed via a catheter currently. She does report what sounds like infiltration with the first attempted using her basilic vein fistula. I explained that this is frequently a is no bruising currently.  Guarding or carotid disease she has no amaurosis fugax, transient ischemic attack or stroke. She's been stable from a cardiac standpoint as well  Past Medical History:  Diagnosis Date  . Adenomatous colon polyp 09/2007  . Arthritis   . Atrial fibrillation with rapid ventricular response ([place]) 10/05/2015  . CAD (coronary artery disease)   . Carotid artery occlusion   . Chronic kidney disease   . Depression with anxiety   . Diabetes mellitus   . Heart murmur   . Hyperlipidemia   . Hypertension   . Neuropathy ([place])   . PAD (peripheral artery disease) ([place])   . Peripheral vascular disease ([place])   . Rheumatic fever   . Sleep apnea     Family History  Problem Relation Age of Onset  . Breast cancer Mother   . Heart disease Father   . Diabetes Father   . Heart disease Brother     SOCIAL HISTORY: Social History  Substance Use Topics  . Smoking status: Never Smoker  . Smokeless tobacco: Never Used  . Alcohol use No    Allergies  Allergen Reactions  . Oxycodone Other (See Comments)    hallucinations  . Clonazepam Other (See Comments)    Unknown reaction  . Gemfibrozil Other (See Comments)   Patient is not aware of this allergy but states that she has side effects to a cholesterol medication in the past  . Carbamazepine Other (See Comments)    Reaction to tegretol - loopy  . Macrobid [Nitrofurantoin] Nausea And Vomiting    Current Outpatient Prescriptions  Medication Sig Dispense Refill  . amiodarone (PACERONE) 200 MG tablet TAKE 1 TABLET(200 MG) BY MOUTH DAILY 90 tablet 1  . amLODipine (NORVASC) 10 MG tablet Take 1 tablet (10 mg total) by mouth daily. 30 tablet 3  . aspirin EC 81 MG EC tablet Take 1 tablet (81 mg total) by mouth daily. 30 tablet 1  . atorvastatin (LIPITOR) 80 MG tablet Take 1 tablet (80 mg total) by mouth daily. 30 tablet 3  . calcium acetate (PHOSLO) 667 MG capsule Take 2 capsules (1,334 mg total) by mouth 3 (three) times daily with meals. 180 capsule 1  . cetirizine (ZYRTEC) 10 MG tablet Take 10 mg by mouth at bedtime.    Marland Kitchen HYDROcodone-acetaminophen (NORCO/VICODIN) 5-325 MG tablet Take 1 tablet by mouth every 6 (six) hours as needed for moderate pain (Must last 30 days.Do not take and drive a car or use machinery.). 110 tablet 0  . hydrocortisone (ANUSOL-HC) 2.5 % rectal cream Place rectally 2 (two) times daily as needed for hemorrhoids or itching. 30 g 0  . insulin NPH-regular Human (NOVOLIN 70/30) (70-30) 100 UNIT/ML injection Inject 15 Units into the skin 2 (two) times daily with a meal. 50 mL  0  . metoprolol (LOPRESSOR) 50 MG tablet Take 1 tablet (50 mg total) by mouth 2 (two) times daily. 180 tablet 3  . nitroGLYCERIN (NITROSTAT) 0.4 MG SL tablet Place 1 tablet (0.4 mg total) under the tongue every 5 (five) minutes as needed for chest pain. Max three pills 45 tablet 0  . polyethylene glycol (MIRALAX / GLYCOLAX) packet Take 17 g by mouth 2 (two) times daily. 14 each 0  . protein supplement (RESOURCE BENEPROTEIN) POWD Take 6 g by mouth 3 (three) times daily with meals.  0  . rOPINIRole (REQUIP) 0.25 MG tablet Take 1 tablet (0.25 mg total) by mouth at  bedtime. 30 tablet 3  . sevelamer carbonate (RENVELA) 800 MG tablet Take 800 mg by mouth 3 (three) times daily with meals.    . warfarin (COUMADIN) 5 MG tablet Take 1 tablet (5 mg total) by mouth daily at 6 PM. 30 tablet 0   Current Facility-Administered Medications  Medication Dose Route Frequency Provider Last Rate Last Dose  . diclofenac (FLECTOR) 1.3 % 1 patch  1 patch Transdermal BID Sanjuana Kava, MD        REVIEW OF SYSTEMS:  [X]  denotes positive finding, [ ]  denotes negative finding Cardiac  Comments:  Chest pain or chest pressure:    Shortness of breath upon exertion:    Short of breath when lying flat:    Irregular heart rhythm:        Vascular    Pain in calf, thigh, or hip brought on by ambulation:    Pain in feet at night that wakes you up from your sleep:     Blood clot in your veins:    Leg swelling:           PHYSICAL EXAM: Vitals:   04/16/16 1609  BP: 130/68  Pulse: 94  Resp: (!) 24  Temp: 98.4 F (36.9 C)  TempSrc: Oral  SpO2: 94%  Weight: 219 lb (99.3 kg)  Height: 5\' 4"  (1.626 m)    GENERAL: The patient is a well-nourished female, in no acute distress. The vital signs are documented above. CARDIOVASCULAR: Right carotid incision well-healed and no carotid bruits bilaterally. PULMONARY: There is good air exchange  MUSCULOSKELETAL: There are no major deformities or cyanosis. NEUROLOGIC: No focal weakness or paresthesias are detected. SKIN: There are no ulcers or rashes noted. PSYCHIATRIC: The patient has a normal affect. Her left arm fistula has no bruising. She has an excellent thrill and good size maturation. The vein is very close to the surface.  DATA:  She did undergo repeat carotid duplex in our office today. This was very difficult study due to the depth of her carotid. We were not able to obtain the same level of stenosis is a prior study suggesting 40-59%. She does have extensive calcific plaque which may obscure higher velocities.  MEDICAL  ISSUES: Long discussion with the patient and her daughter present. I would recommend access of her fistula again. She has been approximate 4 months since placement of this and hopefully this will be adequate. I did suggest that she insist on the most experienced dialysis staff to access her fistula.  Guarding or carotids she is asymptomatic. I again reviewed symptoms with her she'll notify me should this occur. Otherwise we will see her in 6 months with CT angiogram of her carotids for further visualization   Rosetta Posner, MD [facility] Vascular and Vein Specialists of [facility] Tel [phone] Pager [phone]

## 2016-04-17 ENCOUNTER — Ambulatory Visit: Payer: Medicare Other | Admitting: Orthopaedic Surgery

## 2016-04-17 NOTE — Addendum Note (Signed)
Addended by: Lianne Cure A on: 04/17/2016 02:26 PM   Modules accepted: Orders

## 2016-04-22 ENCOUNTER — Ambulatory Visit (INDEPENDENT_AMBULATORY_CARE_PROVIDER_SITE_OTHER): Payer: Medicare Other | Admitting: *Deleted

## 2016-04-22 DIAGNOSIS — I4891 Unspecified atrial fibrillation: Secondary | ICD-10-CM

## 2016-04-22 LAB — POCT INR: INR: 2.7

## 2016-04-23 ENCOUNTER — Ambulatory Visit (INDEPENDENT_AMBULATORY_CARE_PROVIDER_SITE_OTHER): Payer: Medicare Other | Admitting: Orthopaedic Surgery

## 2016-04-23 VITALS — BP 124/71 | HR 94 | Ht 64.0 in | Wt 210.0 lb

## 2016-04-23 DIAGNOSIS — E1042 Type 1 diabetes mellitus with diabetic polyneuropathy: Secondary | ICD-10-CM

## 2016-04-23 DIAGNOSIS — M25572 Pain in left ankle and joints of left foot: Secondary | ICD-10-CM | POA: Diagnosis not present

## 2016-04-23 DIAGNOSIS — I2510 Atherosclerotic heart disease of native coronary artery without angina pectoris: Secondary | ICD-10-CM

## 2016-04-23 MED ORDER — HYDROCODONE-ACETAMINOPHEN 5-325 MG PO TABS: 1.0000 | ORAL_TABLET | Freq: Four times a day (QID) | ORAL | 0 refills | Status: DC | PRN

## 2016-04-23 NOTE — Progress Notes (Signed)
Patient [id number] Briana [name], female DOB:April 19, 1946, 69 y.o. [id number]  Chief Complaint  Patient presents with  . Follow-up    Left ankle    HPI  Briana [name] is a 70 y.o. female who has chronic pain and arthritis of the left ankle.  She has not had relief with injections, rubs or oral NSAIDs.  She is on dialysis three times a week.  Pain medicine does help.  She uses a cane. HPI  Body mass index is 36.05 kg/m.  ROS  Review of Systems  HENT: Negative for congestion.   Respiratory: Negative for cough and shortness of breath.   Cardiovascular: Positive for leg swelling. Negative for chest pain.  Endocrine: Positive for cold intolerance.  Musculoskeletal: Positive for arthralgias, gait problem, joint swelling and myalgias.  Allergic/Immunologic: Positive for environmental allergies.    Past Medical History:  Diagnosis Date  . Adenomatous colon polyp 09/2007  . Arthritis   . Atrial fibrillation with rapid ventricular response ([place]) 10/05/2015  . CAD (coronary artery disease)   . Carotid artery occlusion   . Chronic kidney disease   . Depression with anxiety   . Diabetes mellitus   . Heart murmur   . Hyperlipidemia   . Hypertension   . Neuropathy ([place])   . PAD (peripheral artery disease) ([place])   . Peripheral vascular disease ([place])   . Rheumatic fever   . Sleep apnea     Past Surgical History:  Procedure Laterality Date  . BASCILIC VEIN TRANSPOSITION Left 12/04/2015   Procedure: BASILIC VEIN TRANSPOSITION;  Surgeon: Rosetta Posner, MD;  Location: [facility];  Service: Vascular;  Laterality: Left;  . CAROTID ENDARTERECTOMY  10/04/2009   Right CEA  . CORONARY ARTERY BYPASS GRAFT  2005  . POLYPECTOMY  02/25/2012   Procedure: POLYPECTOMY;  Surgeon: Danie Binder, MD;  Location: [facility];  Service: Endoscopy;  Laterality: N/A;  . TUBAL LIGATION      Family History  Problem Relation Age of Onset  . Breast cancer Mother   . Heart disease Father   . Diabetes Father   .  Heart disease Brother     Social History Social History  Substance Use Topics  . Smoking status: Never Smoker  . Smokeless tobacco: Never Used  . Alcohol use No    Allergies  Allergen Reactions  . Oxycodone Other (See Comments)    hallucinations  . Clonazepam Other (See Comments)    Unknown reaction  . Gemfibrozil Other (See Comments)    Patient is not aware of this allergy but states that she has side effects to a cholesterol medication in the past  . Carbamazepine Other (See Comments)    Reaction to tegretol - loopy  . Macrobid [Nitrofurantoin] Nausea And Vomiting    Current Outpatient Prescriptions  Medication Sig Dispense Refill  . amiodarone (PACERONE) 200 MG tablet TAKE 1 TABLET(200 MG) BY MOUTH DAILY 90 tablet 1  . amLODipine (NORVASC) 10 MG tablet Take 1 tablet (10 mg total) by mouth daily. 30 tablet 3  . aspirin EC 81 MG EC tablet Take 1 tablet (81 mg total) by mouth daily. 30 tablet 1  . atorvastatin (LIPITOR) 80 MG tablet Take 1 tablet (80 mg total) by mouth daily. 30 tablet 3  . calcium acetate (PHOSLO) 667 MG capsule Take 2 capsules (1,334 mg total) by mouth 3 (three) times daily with meals. 180 capsule 1  . cetirizine (ZYRTEC) 10 MG tablet Take 10 mg by mouth at bedtime.    Marland Kitchen  HYDROcodone-acetaminophen (NORCO/VICODIN) 5-325 MG tablet Take 1 tablet by mouth every 6 (six) hours as needed for moderate pain (Must last 30 days.Do not take and drive a car or use machinery.). 110 tablet 0  . hydrocortisone (ANUSOL-HC) 2.5 % rectal cream Place rectally 2 (two) times daily as needed for hemorrhoids or itching. 30 g 0  . insulin NPH-regular Human (NOVOLIN 70/30) (70-30) 100 UNIT/ML injection Inject 15 Units into the skin 2 (two) times daily with a meal. 50 mL 0  . metoprolol (LOPRESSOR) 50 MG tablet Take 1 tablet (50 mg total) by mouth 2 (two) times daily. 180 tablet 3  . nitroGLYCERIN (NITROSTAT) 0.4 MG SL tablet Place 1 tablet (0.4 mg total) under the tongue every 5 (five)  minutes as needed for chest pain. Max three pills 45 tablet 0  . polyethylene glycol (MIRALAX / GLYCOLAX) packet Take 17 g by mouth 2 (two) times daily. 14 each 0  . protein supplement (RESOURCE BENEPROTEIN) POWD Take 6 g by mouth 3 (three) times daily with meals.  0  . rOPINIRole (REQUIP) 0.25 MG tablet Take 1 tablet (0.25 mg total) by mouth at bedtime. 30 tablet 3  . sevelamer carbonate (RENVELA) 800 MG tablet Take 800 mg by mouth 3 (three) times daily with meals.    . warfarin (COUMADIN) 5 MG tablet Take 1 tablet (5 mg total) by mouth daily at 6 PM. 30 tablet 0   Current Facility-Administered Medications  Medication Dose Route Frequency Provider Last Rate Last Dose  . diclofenac (FLECTOR) 1.3 % 1 patch  1 patch Transdermal BID Sanjuana Kava, MD         Physical Exam  Blood pressure 124/71, pulse 94, height 5\' 4"  (1.626 m), weight 210 lb (95.3 kg).  Constitutional: overall normal hygiene, normal nutrition, well developed, normal grooming, normal body habitus. Assistive device:cane  Musculoskeletal: gait and station Limp left, muscle tone and strength are normal, no tremors or atrophy is present.  .  Neurological: coordination overall normal.  Deep tendon reflex/nerve stretch intact.  Sensation normal.  Cranial nerves II-XII intact.   Skin:   Normal overall no scars, lesions, ulcers or rashes. No psoriasis.  Psychiatric: Alert and oriented x 3.  Recent memory intact, remote memory unclear.  Normal mood and affect. Well groomed.  Good eye contact.  Cardiovascular: overall no swelling, no varicosities, no edema bilaterally, normal temperatures of the legs and arms, no clubbing, cyanosis and good capillary refill.  Lymphatic: palpation is normal.  Left ankle with diffuse pain and tenderness but motion is nearly full.  No swelling or redness is present. NV intact.  The patient has been educated about the nature of the problem(s) and counseled on treatment options.  The patient appeared  to understand what I have discussed and is in agreement with it.  Encounter Diagnoses  Name Primary?  . Pain in joint, ankle and foot, left Yes  . Diabetic peripheral neuropathy associated with type 1 diabetes mellitus ([place])     PLAN Call if any problems.  Precautions discussed.  Continue current medications.   Return to clinic 2 months   Electronically Signed Sanjuana Kava, MD 11/28/20173:57 PM

## 2016-04-24 ENCOUNTER — Encounter: Payer: Self-pay | Admitting: Physician Assistant

## 2016-04-24 ENCOUNTER — Other Ambulatory Visit: Payer: Self-pay

## 2016-04-24 NOTE — Progress Notes (Addendum)
Cardiology Office Note    Date:  04/25/2016  ID:  Briana GRENINGER, DOB [birth year], MRN [id number] PCP:  Antionette Fairy, PA-C  Cardiologist:  Dr. Bronson Ing   Chief Complaint: f/u tachycardia  History of Present Illness:  Briana [name] is a 70 y.o. female with history of atrial fib, atrial flutter, CAD s/p CABG 2005, carotid disease s/p R CEA 2011 (followed by vascular), ESRD on HD, depression/anxiety, DM, HTN, HLD, PAD, rheumatic fever, sleep apnea, chronic diastolic CHF, pulm HTN, chronic appearing anemia who presents for f/u of tachycardia.   She underwent CABG on 05/23/2004 with LIMA to LAD, SVG to OM 2, and SVG to distal RCA. She was hospitalized in 123456 for a/c diastolic CHF, PAF, and worsening CKD.  Nuc 09/2015 showed concern for mild peri-infarct ischemia involving the anterior wall and anteroseptal wall; evidence for infarcts involving the anterior and lateral walls, EF 45%. Cath was deferred given her CKD (she had not yet progressed to ESRD until later in the summer). Echo 10/02/15 showed normal LV systolic function, EF 0000000, grade 3 diastolic dysfunction with high filling pressures, mild AI, mild MR, moderate TR, severe pulmonary HTN (69 mmHg). She underwent TEE/DCCV 10/09/15. At some point during a hospitalization in June, her metoprolol was discontinued due to bradycardia. She was discharged on amiodarone 200mg  daily.  She saw Kerin Ransom 01/23/16 as an add-on to [facility] due to tachycardia during dialysis. This was suspected to be rapid atrial flutter with RBBB. Low dose beta blocker was resumed at 12.5mg  BID. This was further titrated to 25mg  BID on 01/26/16 due to continued tachycardia with HR 100. Dr. Bronson Ing further increased metoprolol to 50mg  BID on 03/01/16 due to HR 97bpm. Last labs 11/2015 showed Hgb 10.1, Na 131, K 3.7, BUN 25, Cr 3.11. Last duplex 04/16/16: [id number] RICA, 123456 LICA.  She returns for follow-up with her daughter. EKG continues to show inconsistent P wave  activity, question continued atrial flutter. She reports feeling generally tired and run down. She has no acute symptoms such as CP, SOB, palpitations, or edema but her stamina seems less. She attributed this to going on dialysis but is not quite sure when it began. INR was 2.7 on 11/27 but earlier in November was 1.6-1.8.  Past Medical History:  Diagnosis Date  . Adenomatous colon polyp 09/2007  . Arthritis   . Atrial fibrillation with rapid ventricular response ([place]) 10/05/2015  . CAD (coronary artery disease)    a. s/p CABG 2005.  . Carotid artery disease ([place])    a. s/p R CEA 2011. Followed by vascular -  duplex 04/16/16: [id number] RICA, 123456 LICA.  Marland Kitchen Chronic anemia   . Chronic diastolic CHF (congestive heart failure) ([place])   . Depression with anxiety   . Diabetes mellitus   . ESRD (end stage renal disease) ([place])   . Heart murmur   . Hyperlipidemia   . Hypertension   . Neuropathy ([place])   . PAD (peripheral artery disease) ([place])   . Paroxysmal atrial flutter ([place])   . Peripheral vascular disease ([place])   . Pulmonary hypertension   . Rheumatic fever   . Sleep apnea     Past Surgical History:  Procedure Laterality Date  . BASCILIC VEIN TRANSPOSITION Left 12/04/2015   Procedure: BASILIC VEIN TRANSPOSITION;  Surgeon: Rosetta Posner, MD;  Location: [facility];  Service: Vascular;  Laterality: Left;  . CAROTID ENDARTERECTOMY  10/04/2009   Right CEA  . CORONARY ARTERY BYPASS GRAFT  2005  .  POLYPECTOMY  02/25/2012   Procedure: POLYPECTOMY;  Surgeon: Danie Binder, MD;  Location: [facility];  Service: Endoscopy;  Laterality: N/A;  . TUBAL LIGATION      Current Medications: Current Outpatient Prescriptions  Medication Sig Dispense Refill  . amiodarone (PACERONE) 200 MG tablet TAKE 1 TABLET(200 MG) BY MOUTH DAILY 90 tablet 1  . amLODipine (NORVASC) 10 MG tablet Take 1 tablet (10 mg total) by mouth daily. 30 tablet 3  . aspirin EC 81 MG EC tablet Take 1 tablet (81 mg total) by mouth daily. 30 tablet  1  . atorvastatin (LIPITOR) 80 MG tablet Take 1 tablet (80 mg total) by mouth daily. 30 tablet 3  . calcium acetate (PHOSLO) 667 MG capsule Take 2 capsules (1,334 mg total) by mouth 3 (three) times daily with meals. 180 capsule 1  . cetirizine (ZYRTEC) 10 MG tablet Take 10 mg by mouth at bedtime.    Marland Kitchen HYDROcodone-acetaminophen (NORCO/VICODIN) 5-325 MG tablet Take 1 tablet by mouth every 6 (six) hours as needed for moderate pain (Must last 30 days.Do not take and drive a car or use machinery.). 110 tablet 0  . hydrocortisone (ANUSOL-HC) 2.5 % rectal cream Place rectally 2 (two) times daily as needed for hemorrhoids or itching. 30 g 0  . insulin NPH-regular Human (NOVOLIN 70/30) (70-30) 100 UNIT/ML injection Inject 15 Units into the skin 2 (two) times daily with a meal. 50 mL 0  . metoprolol (LOPRESSOR) 50 MG tablet Take 1 tablet (50 mg total) by mouth 2 (two) times daily. 180 tablet 3  . nitroGLYCERIN (NITROSTAT) 0.4 MG SL tablet Place 1 tablet (0.4 mg total) under the tongue every 5 (five) minutes as needed for chest pain. Max three pills 45 tablet 0  . polyethylene glycol (MIRALAX / GLYCOLAX) packet Take 17 g by mouth 2 (two) times daily. 14 each 0  . protein supplement (RESOURCE BENEPROTEIN) POWD Take 6 g by mouth 3 (three) times daily with meals.  0  . rOPINIRole (REQUIP) 0.25 MG tablet Take 1 tablet (0.25 mg total) by mouth at bedtime. 30 tablet 3  . sevelamer carbonate (RENVELA) 800 MG tablet Take 800 mg by mouth 3 (three) times daily with meals.    . warfarin (COUMADIN) 5 MG tablet Take 1 tablet (5 mg total) by mouth daily at 6 PM. 30 tablet 0   Current Facility-Administered Medications  Medication Dose Route Frequency Provider Last Rate Last Dose  . diclofenac (FLECTOR) 1.3 % 1 patch  1 patch Transdermal BID Sanjuana Kava, MD         Allergies:   Oxycodone; Clonazepam; Gemfibrozil; Carbamazepine; and Macrobid [nitrofurantoin]   Social History   Social History  . Marital status:  Married    Spouse name: N/A  . Number of children: N/A  . Years of education: N/A   Social History Main Topics  . Smoking status: Never Smoker  . Smokeless tobacco: Never Used  . Alcohol use No  . Drug use: No  . Sexual activity: Not Asked   Other Topics Concern  . None   Social History Narrative  . None     Family History:  The patient's family history includes Breast cancer in her mother; Diabetes in her father; Heart disease in her brother and father.   ROS:   Please see the history of present illness.  All other systems are reviewed and otherwise negative.    PHYSICAL EXAM:   VS:  BP 106/64   Pulse 77  Ht 5\' 4"  (1.626 m)   Wt 211 lb (95.7 kg)   SpO2 91%   BMI 36.22 kg/m   BMI: Body mass index is 36.22 kg/m. GEN: Well nourished, well developed obese WF, in no acute distress  HEENT: normocephalic, atraumatic Neck: no JVD, carotid bruits, or masses Cardiac: RRR slightly elevated rate, no murmurs, rubs, or gallops, no edema  Respiratory:  clear to auscultation bilaterally, normal work of breathing GI: soft, nontender, nondistended, + BS MS: no deformity or atrophy  Skin: warm and dry, no rash, dialysis port R upper chest, skin dressing over left AC where she dialyzed Neuro:  Alert and Oriented x 3, Strength and sensation are intact, follows commands Psych: euthymic mood, full affect  Wt Readings from Last 3 Encounters:  04/25/16 211 lb (95.7 kg)  04/23/16 210 lb (95.3 kg)  04/16/16 219 lb (99.3 kg)      Studies/Labs Reviewed:   EKG:  EKG was ordered today and personally reviewed by me and demonstrates suspected atrial flutter versus atrial tach at 95bpm, unusual appearing for sinus, RBBB  Recent Labs: 10/04/2015: TSH 1.354 11/10/2015: B Natriuretic Peptide 668.4 11/26/2015: ALT 14 12/10/2015: BUN 25; Creatinine, Ser 3.11; Hemoglobin 10.1; Magnesium 2.2; Platelets 185; Potassium 3.7; Sodium 131   Lipid Panel   Additional studies/ records that were reviewed  today include: Summarized above.    ASSESSMENT & PLAN:   1. 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 Her recent tracings remain suspicious for some sort of atrial arrhythmia, I.e. Possible atrial flutter or atrial tachycardia. Rate has not changed much despite titration of beta blocker, and P waves are inconsistent at times. This is discordant with her sinus rate which was lower over the summer. Reviewed in depth with Dr. Bronson Ing. Cannot be too aggressive with continuing to push up rate controlling agents given her history of bradycardia when in sinus. 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 The patient does not wish to have PPM if she can avoid it, as I think would be our input as well given that she is a dialysis patient. As she has had labile INR recently, will request that her next check be 1 week out from the last rather than going another 2 weeks. Check basic labs to ensure stability. 2. PAF - s/p TEE/DCCV 09/2015, after which she was placed on amiodarone.  3. CAD s/p CABG - no recent anginal sx. Abnormal nuc was noted in 09/2015 prior to progression to ESRD. As she is free of angina, would continue to treat medically. Continue ASA with caution for now per review with Dr. Bronson Ing. Bleeding precautions reviewed with patient. 4. Chronic diastolic CHF - volume management per HD. 5. HTN - follow closely. If it remains on the softer side can consider dropping amlodipine down.   Disposition: Refer to EP.   Medication Adjustments/Labs and Tests Ordered: Current medicines are reviewed at length with the patient today.  Concerns regarding medicines are outlined above. Medication changes, Labs and Tests ordered today are summarized above and listed in the Patient Instructions accessible in Encounters.   Raechel Ache PA-C  04/25/2016 3:18 PM    [facility] [place], [place] [address] Ph: [phone]; Fax [phone]

## 2016-04-25 ENCOUNTER — Encounter: Payer: Self-pay | Admitting: Physician Assistant

## 2016-04-25 ENCOUNTER — Ambulatory Visit (INDEPENDENT_AMBULATORY_CARE_PROVIDER_SITE_OTHER): Payer: Medicare Other | Admitting: Physician Assistant

## 2016-04-25 VITALS — BP 106/64 | HR 77 | Ht 64.0 in | Wt 211.0 lb

## 2016-04-25 DIAGNOSIS — I2510 Atherosclerotic heart disease of native coronary artery without angina pectoris: Secondary | ICD-10-CM

## 2016-04-25 DIAGNOSIS — I480 Paroxysmal atrial fibrillation: Secondary | ICD-10-CM

## 2016-04-25 DIAGNOSIS — I495 Sick sinus syndrome: Secondary | ICD-10-CM

## 2016-04-25 DIAGNOSIS — I5032 Chronic diastolic (congestive) heart failure: Secondary | ICD-10-CM

## 2016-04-25 DIAGNOSIS — I4892 Unspecified atrial flutter: Secondary | ICD-10-CM | POA: Diagnosis not present

## 2016-04-25 DIAGNOSIS — I10 Essential (primary) hypertension: Secondary | ICD-10-CM

## 2016-04-25 NOTE — Patient Instructions (Signed)
Medication Instructions:  Your physician recommends that you continue on your current medications as directed. Please refer to the Current Medication list given to you today.   Labwork: Your physician recommends that you return for lab work in: today   Testing/Procedures: NONEN  Follow-Up: Your physician recommends that you schedule a follow-up appointment [id number] with Dr. Lovena Le    Any Other Special Instructions Will Be Listed Below (If Applicable).     If you need a refill on your cardiac medications before your next appointment, please call your pharmacy.

## 2016-04-26 LAB — BASIC METABOLIC PANEL
BUN: 28 mg/dL — AB (ref 7–25)
CALCIUM: 9.1 mg/dL (ref 8.6–10.4)
CHLORIDE: 90 mmol/L — AB (ref 98–110)
CO2: 29 mmol/L (ref 20–31)
CREATININE: 3.5 mg/dL — AB (ref 0.60–0.93)
Glucose, Bld: 394 mg/dL — ABNORMAL HIGH (ref 65–99)
Potassium: 4.5 mmol/L (ref 3.5–5.3)
Sodium: 133 mmol/L — ABNORMAL LOW (ref 135–146)

## 2016-04-26 LAB — CBC WITH DIFFERENTIAL/PLATELET
BASOS ABS: 79 {cells}/uL (ref 0–200)
BASOS PCT: 1 %
EOS ABS: 237 {cells}/uL (ref 15–500)
EOS PCT: 3 %
HCT: 41.5 % (ref 35.0–45.0)
Hemoglobin: 13.4 g/dL (ref 11.7–15.5)
LYMPHS PCT: 29 %
Lymphs Abs: 2291 cells/uL (ref 850–3900)
MCH: 29.6 pg (ref 27.0–33.0)
MCHC: 32.3 g/dL (ref 32.0–36.0)
MCV: 91.6 fL (ref 80.0–100.0)
MONOS PCT: 10 %
MPV: 10.6 fL (ref 7.5–12.5)
Monocytes Absolute: 790 cells/uL (ref 200–950)
Neutro Abs: 4503 cells/uL (ref 1500–7800)
Neutrophils Relative %: 57 %
PLATELETS: 226 10*3/uL (ref 140–400)
RBC: 4.53 MIL/uL (ref 3.80–5.10)
RDW: 15.9 % — AB (ref 11.0–15.0)
WBC: 7.9 10*3/uL (ref 3.8–10.8)

## 2016-04-26 LAB — TSH: TSH: 3.33 mIU/L

## 2016-05-01 ENCOUNTER — Ambulatory Visit (INDEPENDENT_AMBULATORY_CARE_PROVIDER_SITE_OTHER): Payer: Medicare Other | Admitting: *Deleted

## 2016-05-01 DIAGNOSIS — I4891 Unspecified atrial fibrillation: Secondary | ICD-10-CM

## 2016-05-01 LAB — POCT INR: INR: 2

## 2016-05-08 ENCOUNTER — Encounter: Payer: Self-pay | Admitting: Internal Medicine

## 2016-05-08 ENCOUNTER — Ambulatory Visit (INDEPENDENT_AMBULATORY_CARE_PROVIDER_SITE_OTHER): Payer: Medicare Other | Admitting: *Deleted

## 2016-05-08 DIAGNOSIS — I4891 Unspecified atrial fibrillation: Secondary | ICD-10-CM

## 2016-05-08 DIAGNOSIS — I2510 Atherosclerotic heart disease of native coronary artery without angina pectoris: Secondary | ICD-10-CM | POA: Diagnosis not present

## 2016-05-08 LAB — POCT INR: INR: 2.7

## 2016-05-13 ENCOUNTER — Encounter: Payer: Self-pay | Admitting: Internal Medicine

## 2016-05-13 ENCOUNTER — Ambulatory Visit (INDEPENDENT_AMBULATORY_CARE_PROVIDER_SITE_OTHER): Payer: Medicare Other | Admitting: Internal Medicine

## 2016-05-13 VITALS — BP 122/68 | HR 79 | Ht 64.0 in | Wt 219.6 lb

## 2016-05-13 DIAGNOSIS — I4891 Unspecified atrial fibrillation: Secondary | ICD-10-CM | POA: Diagnosis not present

## 2016-05-13 NOTE — Patient Instructions (Addendum)
Medication Instructions:  Your physician has recommended you make the following change in your medication:  1) STOP Amiodarone   Labwork: None Ordered   Testing/Procedures: None Ordered    Follow-Up: Your physician recommends that you schedule a follow-up appointment in: 3 months with Dr. Lovena Le (appointment in [place], if possible)   Any Other Special Instructions Will Be Listed Below (If Applicable).     If you need a refill on your cardiac medications before your next appointment, please call your pharmacy.

## 2016-05-13 NOTE — Progress Notes (Signed)
HPI Briana [name] is referred today by Melina Copa, for evaluation of multiple different atrial arrhythmias. She is a pleasant 70 yo woman with multiple medical problems including ESRD on HD, HTN, CAD, s/p CABG, PAF, s/p DCCV, amiodarone therapy who has had recurrent atrial arrhythmias with a mostly but not completely controlled VR. The patient has been anti-coagulated and on amiodarone. She has not had syncope. She has had trouble with her fistula and is being dialyzed with an indwelling catheter. She is able to walk reasonably well although her daughter notes that she is sedentary.  She no longer has palpitations. Allergies  Allergen Reactions  . Oxycodone Other (See Comments)    hallucinations  . Clonazepam Other (See Comments)    Unknown reaction  . Gemfibrozil Other (See Comments)    Patient is not aware of this allergy but states that she has side effects to a cholesterol medication in the past  . Carbamazepine Other (See Comments)    Reaction to tegretol - loopy  . Macrobid [Nitrofurantoin] Nausea And Vomiting     Current Outpatient Prescriptions  Medication Sig Dispense Refill  . amLODipine (NORVASC) 10 MG tablet Take 1 tablet (10 mg total) by mouth daily. 30 tablet 3  . aspirin EC 81 MG EC tablet Take 1 tablet (81 mg total) by mouth daily. 30 tablet 1  . atorvastatin (LIPITOR) 80 MG tablet Take 1 tablet (80 mg total) by mouth daily. 30 tablet 3  . calcium acetate (PHOSLO) 667 MG capsule Take 2 capsules (1,334 mg total) by mouth 3 (three) times daily with meals. 180 capsule 1  . cetirizine (ZYRTEC) 10 MG tablet Take 10 mg by mouth at bedtime.    . diazepam (VALIUM) 5 MG tablet Take 5 mg by mouth every 6 (six) hours as needed for anxiety.    . ferric citrate (AURYXIA) 1 GM 210 MG(Fe) tablet Take 420 mg by mouth 3 (three) times daily with meals.    Marland Kitchen HYDROcodone-acetaminophen (NORCO/VICODIN) 5-325 MG tablet Take 1 tablet by mouth every 6 (six) hours as needed for moderate  pain (Must last 30 days.Do not take and drive a car or use machinery.). 110 tablet 0  . hydrocortisone (ANUSOL-HC) 2.5 % rectal cream Place rectally 2 (two) times daily as needed for hemorrhoids or itching. 30 g 0  . insulin NPH-regular Human (NOVOLIN 70/30) (70-30) 100 UNIT/ML injection Inject 15 Units into the skin 2 (two) times daily with a meal. 50 mL 0  . metoprolol (LOPRESSOR) 50 MG tablet Take 1 tablet (50 mg total) by mouth 2 (two) times daily. 180 tablet 3  . nitroGLYCERIN (NITROSTAT) 0.4 MG SL tablet Place 1 tablet (0.4 mg total) under the tongue every 5 (five) minutes as needed for chest pain. Max three pills 45 tablet 0  . polyethylene glycol (MIRALAX / GLYCOLAX) packet Take 17 g by mouth 2 (two) times daily. 14 each 0  . protein supplement (RESOURCE BENEPROTEIN) POWD Take 6 g by mouth 3 (three) times daily with meals.  0  . rOPINIRole (REQUIP) 0.25 MG tablet Take 1 tablet (0.25 mg total) by mouth at bedtime. 30 tablet 3  . sevelamer carbonate (RENVELA) 800 MG tablet Take 800 mg by mouth 3 (three) times daily with meals.    . warfarin (COUMADIN) 5 MG tablet Take 1 tablet (5 mg total) by mouth daily at 6 PM. 30 tablet 0   Current Facility-Administered Medications  Medication Dose Route Frequency Provider Last Rate Last Dose  .  diclofenac (FLECTOR) 1.3 % 1 patch  1 patch Transdermal BID Sanjuana Kava, MD         Past Medical History:  Diagnosis Date  . Adenomatous colon polyp 09/2007  . Arthritis   . Atrial fibrillation with rapid ventricular response ([place]) 10/05/2015  . CAD (coronary artery disease)    a. s/p CABG 2005.  . Carotid artery disease ([place])    a. s/p R CEA 2011. Followed by vascular -  duplex 04/16/16: A999333 RICA, 123456 LICA.  Marland Kitchen Chronic anemia   . Chronic diastolic CHF (congestive heart failure) ([place])   . Depression with anxiety   . Diabetes mellitus   . ESRD (end stage renal disease) ([place])   . Heart murmur   . Hyperlipidemia   . Hypertension   . Neuropathy  ([place])   . PAD (peripheral artery disease) ([place])   . Paroxysmal atrial flutter ([place])   . Peripheral vascular disease ([place])   . Pulmonary hypertension   . Rheumatic fever   . Sleep apnea     ROS:   All systems reviewed and negative except as noted in the HPI.   Past Surgical History:  Procedure Laterality Date  . BASCILIC VEIN TRANSPOSITION Left 12/04/2015   Procedure: BASILIC VEIN TRANSPOSITION;  Surgeon: Rosetta Posner, MD;  Location: [facility];  Service: Vascular;  Laterality: Left;  . CAROTID ENDARTERECTOMY  10/04/2009   Right CEA  . CORONARY ARTERY BYPASS GRAFT  2005  . POLYPECTOMY  02/25/2012   Procedure: POLYPECTOMY;  Surgeon: Danie Binder, MD;  Location: [facility];  Service: Endoscopy;  Laterality: N/A;  . TUBAL LIGATION       Family History  Problem Relation Age of Onset  . Breast cancer Mother   . Heart disease Father   . Diabetes Father   . Heart disease Brother      Social History   Social History  . Marital status: Married    Spouse name: N/A  . Number of children: N/A  . Years of education: N/A   Occupational History  . Not on file.   Social History Main Topics  . Smoking status: Never Smoker  . Smokeless tobacco: Never Used  . Alcohol use No  . Drug use: No  . Sexual activity: Not on file   Other Topics Concern  . Not on file   Social History Narrative  . No narrative on file     BP 122/68   Pulse 79   Ht 5\' 4"  (1.626 m)   Wt 219 lb 9.6 oz (99.6 kg)   BMI 37.69 kg/m   Physical Exam:  Well appearing NAD HEENT: Unremarkable Neck:  No JVD, no thyromegally Lymphatics:  No adenopathy Back:  No CVA tenderness Lungs:  Clear with no wheezes; indwelling right sided HD catheter in right IJ. HEART:  IRegular rate rhythm, no murmurs, no rubs, no clicks, split S2. Abd:  soft, positive bowel sounds, no organomegally, no rebound, no guarding Ext:  2 plus pulses, no edema, no cyanosis, no clubbing Skin:  No rashes no nodules Neuro:  CN II through  XII intact, motor grossly intact  EKG - atrial fib with RBBB  Assess/Plan: 1. Atrial fib/flutter/tachycardia - I have reviewed all of her ECG's. She has a multitude of atrial arrhythmias and is currently out of rhythm and in atrial fib despite amiodarone. We discussed rate vs rhythm control. I have recommended she stop her amiodarone and pursue a strategy of rate control. On both amio and  metoprolol, I am concerned that she will develop symptomatic bradycardia if we pursue rhythm control. She will stop her amio today and I will see her back in a few months. 2. HTN - her blood pressure is controlled. Will follow. 3. CAD - she denies anginal symptoms. She may require additional metoprolol. 4. Obesity - she is encouraged to lose weight. Will follow.  Briana [name].D.

## 2016-05-15 ENCOUNTER — Ambulatory Visit (INDEPENDENT_AMBULATORY_CARE_PROVIDER_SITE_OTHER): Payer: Medicare Other | Admitting: *Deleted

## 2016-05-15 DIAGNOSIS — I4891 Unspecified atrial fibrillation: Secondary | ICD-10-CM

## 2016-05-15 LAB — POCT INR: INR: 2.6

## 2016-05-22 ENCOUNTER — Other Ambulatory Visit: Payer: Self-pay | Admitting: *Deleted

## 2016-05-22 ENCOUNTER — Ambulatory Visit (INDEPENDENT_AMBULATORY_CARE_PROVIDER_SITE_OTHER): Payer: Medicare Other | Admitting: *Deleted

## 2016-05-22 DIAGNOSIS — I4891 Unspecified atrial fibrillation: Secondary | ICD-10-CM

## 2016-05-22 DIAGNOSIS — N186 End stage renal disease: Secondary | ICD-10-CM

## 2016-05-22 DIAGNOSIS — Z4931 Encounter for adequacy testing for hemodialysis: Secondary | ICD-10-CM

## 2016-05-22 DIAGNOSIS — R0989 Other specified symptoms and signs involving the circulatory and respiratory systems: Secondary | ICD-10-CM

## 2016-05-22 LAB — POCT INR: INR: 2.3

## 2016-05-29 ENCOUNTER — Other Ambulatory Visit: Payer: Self-pay | Admitting: Physical Medicine & Rehabilitation

## 2016-06-03 ENCOUNTER — Encounter: Payer: Self-pay | Admitting: Family

## 2016-06-05 ENCOUNTER — Ambulatory Visit (INDEPENDENT_AMBULATORY_CARE_PROVIDER_SITE_OTHER): Payer: Medicare Other | Admitting: *Deleted

## 2016-06-05 DIAGNOSIS — I4891 Unspecified atrial fibrillation: Secondary | ICD-10-CM

## 2016-06-05 LAB — POCT INR: INR: 2

## 2016-06-07 ENCOUNTER — Other Ambulatory Visit: Payer: Self-pay

## 2016-06-07 ENCOUNTER — Ambulatory Visit (INDEPENDENT_AMBULATORY_CARE_PROVIDER_SITE_OTHER): Payer: Medicare Other | Admitting: Physician Assistant

## 2016-06-07 ENCOUNTER — Ambulatory Visit (HOSPITAL_COMMUNITY)
Admission: RE | Admit: 2016-06-07 | Discharge: 2016-06-07 | Disposition: A | Discharge status: Home / Self Care | Payer: Medicare Other | Source: Ambulatory Visit | Attending: Vascular Surgery | Admitting: Vascular Surgery

## 2016-06-07 VITALS — BP 129/72 | HR 99 | Temp 97.9°F | Resp 18 | Ht 64.0 in | Wt 214.5 lb

## 2016-06-07 DIAGNOSIS — R0989 Other specified symptoms and signs involving the circulatory and respiratory systems: Secondary | ICD-10-CM

## 2016-06-07 DIAGNOSIS — N186 End stage renal disease: Secondary | ICD-10-CM | POA: Diagnosis not present

## 2016-06-07 DIAGNOSIS — Z992 Dependence on renal dialysis: Secondary | ICD-10-CM

## 2016-06-07 DIAGNOSIS — Z4931 Encounter for adequacy testing for hemodialysis: Secondary | ICD-10-CM | POA: Diagnosis present

## 2016-06-07 NOTE — Progress Notes (Signed)
CC: ESRD   HPI: Briana [name] is a 71 y.o. female who has been on HD through  functioning Hemodialysis right tunneled catheter.  She has access in the left upper extremity that has not worked well for trial of HD.  The have been unable to use it and have back walled the fistula twice per the patient.   Otherwise her cathter is working fine.  She has no new medical complaints since her last visit.    Past Medical History:  Diagnosis Date  . Adenomatous colon polyp 09/2007  . Arthritis   . Atrial fibrillation with rapid ventricular response ([place]) 10/05/2015  . CAD (coronary artery disease)    a. s/p CABG 2005.  . Carotid artery disease ([place])    a. s/p R CEA 2011. Followed by vascular -  duplex 04/16/16: [id number] RICA, 123456 LICA.  Marland Kitchen Chronic anemia   . Chronic diastolic CHF (congestive heart failure) ([place])   . Depression with anxiety   . Diabetes mellitus   . ESRD (end stage renal disease) ([place])   . Heart murmur   . Hyperlipidemia   . Hypertension   . Neuropathy ([place])   . PAD (peripheral artery disease) ([place])   . Paroxysmal atrial flutter ([place])   . Peripheral vascular disease ([place])   . Pulmonary hypertension   . Rheumatic fever   . Sleep apnea     Family Hx Family History  Problem Relation Age of Onset  . Breast cancer Mother   . Heart disease Father   . Diabetes Father   . Heart disease Brother     Social HX Social History  Substance Use Topics  . Smoking status: Never Smoker  . Smokeless tobacco: Never Used  . Alcohol use No    Allergies Allergies  Allergen Reactions  . Oxycodone Other (See Comments)    hallucinations  . Clonazepam Other (See Comments)    Unknown reaction  . Gemfibrozil Other (See Comments)    Patient is not aware of this allergy but states that she has side effects to a cholesterol medication in the past  . Carbamazepine Other (See Comments)    Reaction to tegretol - loopy  . Macrobid [Nitrofurantoin] Nausea And Vomiting     Medications Current Outpatient Prescriptions  Medication Sig Dispense Refill  . amLODipine (NORVASC) 10 MG tablet Take 1 tablet (10 mg total) by mouth daily. 30 tablet 3  . aspirin EC 81 MG EC tablet Take 1 tablet (81 mg total) by mouth daily. 30 tablet 1  . atorvastatin (LIPITOR) 80 MG tablet Take 1 tablet (80 mg total) by mouth daily. 30 tablet 3  . calcium acetate (PHOSLO) 667 MG capsule Take 2 capsules (1,334 mg total) by mouth 3 (three) times daily with meals. 180 capsule 1  . cetirizine (ZYRTEC) 10 MG tablet Take 10 mg by mouth at bedtime.    . diazepam (VALIUM) 5 MG tablet Take 5 mg by mouth every 6 (six) hours as needed for anxiety.    . ferric citrate (AURYXIA) 1 GM 210 MG(Fe) tablet Take 420 mg by mouth 3 (three) times daily with meals.    Marland Kitchen HYDROcodone-acetaminophen (NORCO/VICODIN) 5-325 MG tablet Take 1 tablet by mouth every 6 (six) hours as needed for moderate pain (Must last 30 days.Do not take and drive a car or use machinery.). 110 tablet 0  . hydrocortisone (ANUSOL-HC) 2.5 % rectal cream Place rectally 2 (two) times daily as needed for hemorrhoids or itching. 30 g  0  . insulin NPH-regular Human (NOVOLIN 70/30) (70-30) 100 UNIT/ML injection Inject 15 Units into the skin 2 (two) times daily with a meal. 50 mL 0  . metoprolol (LOPRESSOR) 50 MG tablet Take 1 tablet (50 mg total) by mouth 2 (two) times daily. 180 tablet 3  . nitroGLYCERIN (NITROSTAT) 0.4 MG SL tablet Place 1 tablet (0.4 mg total) under the tongue every 5 (five) minutes as needed for chest pain. Max three pills 45 tablet 0  . polyethylene glycol (MIRALAX / GLYCOLAX) packet Take 17 g by mouth 2 (two) times daily. 14 each 0  . protein supplement (RESOURCE BENEPROTEIN) POWD Take 6 g by mouth 3 (three) times daily with meals.  0  . rOPINIRole (REQUIP) 0.25 MG tablet Take 1 tablet (0.25 mg total) by mouth at bedtime. 30 tablet 3  . sevelamer carbonate (RENVELA) 800 MG tablet Take 800 mg by mouth 3 (three) times daily  with meals.    . warfarin (COUMADIN) 5 MG tablet Take 1 tablet (5 mg total) by mouth daily at 6 PM. 30 tablet 0   Current Facility-Administered Medications  Medication Dose Route Frequency Provider Last Rate Last Dose  . diclofenac (FLECTOR) 1.3 % 1 patch  1 patch Transdermal BID Sanjuana Kava, MD        Labs COAG Lab Results  Component Value Date   INR 2.0 06/05/2016   INR 2.3 05/22/2016   INR 2.6 05/15/2016   No results found for: PTT  PHYSICAL EXAM  Vitals:   06/07/16 1334  BP: 129/72  Pulse: 99  Resp: 18  Temp: 97.9 F (36.6 C)    General:  WDWN in NAD HENT: WNL Eyes: Pupils equal Pulmonary: normal non-labored breathing  Cardiac: RRR, Skin: normal, no cyanosis, jaundice, pallor or bruising Vascular Exam/Pulses: 2+  pulses in LEFT upper extremity. Extremities without ischemic changes, no Gangrene , no cellulitis; no open wounds;   There is a good thrill and good bruit in the left basilic fistula. Hand grip is 5/5 and sensation in digits is intact;   Impression: This is a 71 y.o. female who has a functioning HD access via catheter on the right IJ  Poor functioning left Brachial basilic fistula  Plan:  We will schedule her for a fistulogram to evaluate and possible intervention of her fistula. She will have to stop her coumadin for 5 days.  Laurence Slate [facility] 06/07/2016 2:42 PM   Dr. Trula Slade is in the office today

## 2016-06-11 ENCOUNTER — Telehealth: Payer: Self-pay | Admitting: Nurse Practitioner

## 2016-06-11 ENCOUNTER — Telehealth: Payer: Self-pay | Admitting: *Deleted

## 2016-06-11 ENCOUNTER — Other Ambulatory Visit: Payer: Self-pay | Admitting: Nurse Practitioner

## 2016-06-11 MED ORDER — ENOXAPARIN SODIUM 100 MG/ML ~~LOC~~ SOLN: 100.0000 mg | SUBCUTANEOUS | 1 refills | Status: DC

## 2016-06-11 NOTE — Telephone Encounter (Signed)
Pt is scheduled for a Lt arm fistulogram on 06/14/16 by [facility].  They had her stop coumadin on 06/08/16.  Per nurse at [facility], Dr Raliegh Ip said pt needed to be bridged with Lovenox but I was not notified until today so pt is one day behind.  Spoke with pt's daughter.  Pt will start Lovenox 100mg  daily today.  Wt 97.3  SCr 3.5  CrCl 22.65  Lovenox dosed by renal dose.   1/16  Lovenox 100mg  sq 6pm 1/17  Lovenox 100mg  sq 6pm 1/18  Lovenox 100mg  sq 1pm 1/19  Procedure-----no lovenox ----coumadin 7.5mg  @ 6pm 1/20  Lovenox 100mg  sq 8am and coumadin 7.5mg  @ 6pm 1/21   Lovenox 100mg  sq 8am and coumadin 5mg  @ 6pm 1/22  Lovenox 100mg  sq 8am and coumadin 5mg  @ 6pm 1/23  Lovenox 100mg  sq 8am and coumadin 5mg  @ 6pm 1/24  Lovenox 100mg  sq 8am and coumadin 5mg  @ 6pm 1/25  Lovenox 100mg  sq 8am and [facility] to check INR

## 2016-06-11 NOTE — Telephone Encounter (Signed)
-----   Message from Erskine Emery, [facility] sent at 06/11/2016  4:04 PM EST ----- Briana [name],  Per our protocol the patient would not be bridged since no history of stroke and CHADS of 3 for Afib; but that being said if Dr. Bronson Ing wanted a bridge I would go with his recommendation. Since her last dose of Coumadin was 06/09/15 she should have started lovenox yesterday if we were to bridge and yes dose of lovenox would be 100mg  daily.   Let me know if you need anything else on this!  Thanks, Georgina Peer   ----- Message ----- From: Malen Gauze, RN Sent: 06/11/2016   1:44 PM To: Malen Gauze, RN, Erskine Emery, [facility]  I just got a call from [facility].  Pt scheduled for Lt arm fistulogram 1/19.  They told pt to take last dose of coumadin 1/13.  Said Dr Bronson Ing told them Friday pt would need lovenox but no documentation in chart.  She has CKD on dialysis.  SCr 3.5  Wt 97.3kg  CrCl 22.65  CHADS2 = 3   CHADSVASc = 6   Do you agree with lovenox?  If so, renal dose of 100mg  once daily? Thanks, Briana [name]

## 2016-06-11 NOTE — Telephone Encounter (Signed)
   Pts dtr called b/c lovenox Rx not sent it.  Pt having procedure on Friday and needs lovenox bridge (100 mg daily).  I reviewed chart and noted that a Rx was sent into a [facility] in [place].  Pt would like it called into [facility] in [place].  I have cancelled the prior Rx and sent in new Rx to [facility] (100 mg Gleason daily, # 10, 1 refill).  Caller verbalized understanding and was grateful for the call back.  Murray Hodgkins, NP 06/11/2016, 6:32 PM

## 2016-06-14 ENCOUNTER — Encounter (HOSPITAL_COMMUNITY)
Admission: RE | Disposition: A | Discharge status: Home / Self Care | Payer: Self-pay | Source: Ambulatory Visit | Attending: Vascular Surgery

## 2016-06-14 ENCOUNTER — Ambulatory Visit (HOSPITAL_COMMUNITY)
Admission: RE | Admit: 2016-06-14 | Discharge: 2016-06-14 | Disposition: A | Discharge status: Home / Self Care | Payer: Medicare Other | Source: Ambulatory Visit | Attending: Vascular Surgery | Admitting: Vascular Surgery

## 2016-06-14 ENCOUNTER — Encounter (HOSPITAL_COMMUNITY): Payer: Self-pay | Admitting: *Deleted

## 2016-06-14 DIAGNOSIS — F418 Other specified anxiety disorders: Secondary | ICD-10-CM | POA: Diagnosis not present

## 2016-06-14 DIAGNOSIS — Z7901 Long term (current) use of anticoagulants: Secondary | ICD-10-CM | POA: Insufficient documentation

## 2016-06-14 DIAGNOSIS — I2510 Atherosclerotic heart disease of native coronary artery without angina pectoris: Secondary | ICD-10-CM | POA: Diagnosis not present

## 2016-06-14 DIAGNOSIS — Z833 Family history of diabetes mellitus: Secondary | ICD-10-CM | POA: Insufficient documentation

## 2016-06-14 DIAGNOSIS — E1140 Type 2 diabetes mellitus with diabetic neuropathy, unspecified: Secondary | ICD-10-CM | POA: Insufficient documentation

## 2016-06-14 DIAGNOSIS — E1122 Type 2 diabetes mellitus with diabetic chronic kidney disease: Secondary | ICD-10-CM | POA: Diagnosis not present

## 2016-06-14 DIAGNOSIS — I132 Hypertensive heart and chronic kidney disease with heart failure and with stage 5 chronic kidney disease, or end stage renal disease: Secondary | ICD-10-CM | POA: Diagnosis not present

## 2016-06-14 DIAGNOSIS — E1151 Type 2 diabetes mellitus with diabetic peripheral angiopathy without gangrene: Secondary | ICD-10-CM | POA: Insufficient documentation

## 2016-06-14 DIAGNOSIS — G4730 Sleep apnea, unspecified: Secondary | ICD-10-CM | POA: Diagnosis not present

## 2016-06-14 DIAGNOSIS — I5032 Chronic diastolic (congestive) heart failure: Secondary | ICD-10-CM | POA: Insufficient documentation

## 2016-06-14 DIAGNOSIS — Z4901 Encounter for fitting and adjustment of extracorporeal dialysis catheter: Secondary | ICD-10-CM | POA: Insufficient documentation

## 2016-06-14 DIAGNOSIS — N186 End stage renal disease: Secondary | ICD-10-CM | POA: Insufficient documentation

## 2016-06-14 DIAGNOSIS — E785 Hyperlipidemia, unspecified: Secondary | ICD-10-CM | POA: Diagnosis not present

## 2016-06-14 DIAGNOSIS — Z951 Presence of aortocoronary bypass graft: Secondary | ICD-10-CM | POA: Diagnosis not present

## 2016-06-14 DIAGNOSIS — R011 Cardiac murmur, unspecified: Secondary | ICD-10-CM | POA: Insufficient documentation

## 2016-06-14 DIAGNOSIS — Z8249 Family history of ischemic heart disease and other diseases of the circulatory system: Secondary | ICD-10-CM | POA: Diagnosis not present

## 2016-06-14 DIAGNOSIS — D631 Anemia in chronic kidney disease: Secondary | ICD-10-CM | POA: Insufficient documentation

## 2016-06-14 DIAGNOSIS — M1990 Unspecified osteoarthritis, unspecified site: Secondary | ICD-10-CM | POA: Diagnosis not present

## 2016-06-14 DIAGNOSIS — Z86010 Personal history of colonic polyps: Secondary | ICD-10-CM | POA: Insufficient documentation

## 2016-06-14 DIAGNOSIS — Z794 Long term (current) use of insulin: Secondary | ICD-10-CM | POA: Diagnosis not present

## 2016-06-14 DIAGNOSIS — Z803 Family history of malignant neoplasm of breast: Secondary | ICD-10-CM | POA: Insufficient documentation

## 2016-06-14 DIAGNOSIS — Z7982 Long term (current) use of aspirin: Secondary | ICD-10-CM | POA: Diagnosis not present

## 2016-06-14 DIAGNOSIS — I272 Pulmonary hypertension, unspecified: Secondary | ICD-10-CM | POA: Insufficient documentation

## 2016-06-14 DIAGNOSIS — I4892 Unspecified atrial flutter: Secondary | ICD-10-CM | POA: Insufficient documentation

## 2016-06-14 DIAGNOSIS — I4891 Unspecified atrial fibrillation: Secondary | ICD-10-CM | POA: Diagnosis not present

## 2016-06-14 DIAGNOSIS — T82898A Other specified complication of vascular prosthetic devices, implants and grafts, initial encounter: Secondary | ICD-10-CM

## 2016-06-14 HISTORY — PX: PERIPHERAL VASCULAR CATHETERIZATION: SHX172C

## 2016-06-14 LAB — PROTIME-INR
INR: 1.13
Prothrombin Time: 14.5 seconds (ref 11.4–15.2)

## 2016-06-14 LAB — POCT I-STAT, CHEM 8
BUN: 65 mg/dL — AB (ref 6–20)
CALCIUM ION: 1.16 mmol/L (ref 1.15–1.40)
CHLORIDE: 98 mmol/L — AB (ref 101–111)
Creatinine, Ser: 5.4 mg/dL — ABNORMAL HIGH (ref 0.44–1.00)
Glucose, Bld: 165 mg/dL — ABNORMAL HIGH (ref 65–99)
HCT: 38 % (ref 36.0–46.0)
Hemoglobin: 12.9 g/dL (ref 12.0–15.0)
POTASSIUM: 4.2 mmol/L (ref 3.5–5.1)
SODIUM: 137 mmol/L (ref 135–145)
TCO2: 28 mmol/L (ref 0–100)

## 2016-06-14 LAB — GLUCOSE, CAPILLARY: Glucose-Capillary: 157 mg/dL — ABNORMAL HIGH (ref 65–99)

## 2016-06-14 SURGERY — A/V FISTULAGRAM

## 2016-06-14 MED ORDER — METOPROLOL TARTRATE 5 MG/5ML IV SOLN: 2.0000 mg | INTRAVENOUS | Status: DC | PRN

## 2016-06-14 MED ORDER — ACETAMINOPHEN 325 MG RE SUPP
325.0000 mg | RECTAL | Status: DC | PRN
  Filled 2016-06-14: qty 2

## 2016-06-14 MED ORDER — ONDANSETRON HCL 4 MG/2ML IJ SOLN: 4.0000 mg | Freq: Four times a day (QID) | INTRAMUSCULAR | Status: DC | PRN

## 2016-06-14 MED ORDER — ACETAMINOPHEN 325 MG PO TABS: 325.0000 mg | ORAL_TABLET | ORAL | Status: DC | PRN

## 2016-06-14 MED ORDER — LABETALOL HCL 5 MG/ML IV SOLN: 10.0000 mg | INTRAVENOUS | Status: DC | PRN

## 2016-06-14 MED ORDER — HEPARIN (PORCINE) IN NACL 2-0.9 UNIT/ML-% IJ SOLN
INTRAMUSCULAR | Status: AC
  Filled 2016-06-14: qty 500

## 2016-06-14 MED ORDER — SODIUM CHLORIDE 0.9 % IV SOLN
INTRAVENOUS | Status: DC | PRN
  Administered 2016-06-14: 10 mL/h via INTRAVENOUS

## 2016-06-14 MED ORDER — HEPARIN (PORCINE) IN NACL 2-0.9 UNIT/ML-% IJ SOLN
INTRAMUSCULAR | Status: DC | PRN
  Administered 2016-06-14: 500 mL

## 2016-06-14 MED ORDER — SODIUM CHLORIDE 0.9% FLUSH: 3.0000 mL | INTRAVENOUS | Status: DC | PRN

## 2016-06-14 MED ORDER — IODIXANOL 320 MG/ML IV SOLN
INTRAVENOUS | Status: DC | PRN
  Administered 2016-06-14: 25 mL

## 2016-06-14 MED ORDER — HYDRALAZINE HCL 20 MG/ML IJ SOLN: 5.0000 mg | INTRAMUSCULAR | Status: DC | PRN

## 2016-06-14 MED ORDER — LIDOCAINE HCL (PF) 1 % IJ SOLN
INTRAMUSCULAR | Status: AC
  Filled 2016-06-14: qty 30

## 2016-06-14 MED ORDER — LIDOCAINE HCL (PF) 1 % IJ SOLN
INTRAMUSCULAR | Status: DC | PRN
Start: 2016-06-14 — End: 2016-06-14
  Administered 2016-06-14: 1 mL

## 2016-06-14 SURGICAL SUPPLY — 10 items
BAG SNAP BAND KOVER 36X36 (MISCELLANEOUS) ×2 IMPLANT
COVER DOME SNAP 22 D (MISCELLANEOUS) ×2 IMPLANT
COVER PRB 48X5XTLSCP FOLD TPE (BAG) ×1 IMPLANT
COVER PROBE 5X48 (BAG) ×2
KIT MICROINTRODUCER STIFF 5F (SHEATH) ×1 IMPLANT
PROTECTION STATION PRESSURIZED (MISCELLANEOUS) ×2
STATION PROTECTION PRESSURIZED (MISCELLANEOUS) ×1 IMPLANT
STOPCOCK MORSE 400PSI 3WAY (MISCELLANEOUS) ×2 IMPLANT
TRAY PV CATH (CUSTOM PROCEDURE TRAY) ×2 IMPLANT
TUBING CIL FLEX 10 FLL-RA (TUBING) ×2 IMPLANT

## 2016-06-14 NOTE — Discharge Instructions (Signed)
Fistulogram, Care After °Introduction °Refer to this sheet in the next few weeks. These instructions provide you with information on caring for yourself after your procedure. Your health care provider may also give you more specific instructions. Your treatment has been planned according to current medical practices, but problems sometimes occur. Call your health care provider if you have any problems or questions after your procedure. °What can I expect after the procedure? °After your procedure, it is typical to have the following: °· A small amount of discomfort in the area where the catheters were placed. °· A small amount of bruising around the fistula. °· Sleepiness and fatigue. °Follow these instructions at home: °· Rest at home for the day following your procedure. °· Do not drive or operate heavy machinery while taking pain medicine. °· Take medicines only as directed by your health care provider. °· Do not take baths, swim, or use a hot tub until your health care provider approves. You may shower 24 hours after the procedure or as directed by your health care provider. °· There are many different ways to close and cover an incision, including stitches, skin glue, and adhesive strips. Follow your health care provider's instructions on: °¨ Incision care. °¨ Bandage (dressing) changes and removal. °¨ Incision closure removal. °· Monitor your dialysis fistula carefully. °Contact a health care provider if: °· You have drainage, redness, swelling, or pain at your catheter site. °· You have a fever. °· You have chills. °Get help right away if: °· You feel weak. °· You have trouble balancing. °· You have trouble moving your arms or legs. °· You have problems with your speech or vision. °· You can no longer feel a vibration or buzz when you put your fingers over your dialysis fistula. °· The limb that was used for the procedure: °¨ Swells. °¨ Is painful. °¨ Is cold. °¨ Is discolored, such as blue or pale  white. °This information is not intended to replace advice given to you by your health care provider. Make sure you discuss any questions you have with your health care provider. °Document Released: 09/27/2013 Document Revised: 10/19/2015 Document Reviewed: 07/02/2013 °© 2017 Elsevier ° °

## 2016-06-14 NOTE — Interval H&P Note (Signed)
History and Physical Interval Note:  06/14/2016 11:13 AM  Briana [name]  has presented today for surgery, with the diagnosis of difficulty canulating left arm fist.  The various methods of treatment have been discussed with the patient and family. After consideration of risks, benefits and other options for treatment, the patient has consented to  Procedure(s): A/V Fistulagram - Left Upper (N/A) as a surgical intervention .  The patient's history has been reviewed, patient examined, no change in status, stable for surgery.  I have reviewed the patient's chart and labs.  Questions were answered to the patient's satisfaction.     [name]

## 2016-06-14 NOTE — Op Note (Signed)
Procedure: Left upper extremity fistulogram  Preoperative diagnosis: Poorly functioning left arm AV fistula in the postoperative postoperative diagnosis: Same  Anesthesia: Local  Operative findings: 9 mm diameter upper arm AV fistula less than 3 mm from the skin surface, no obvious inflow or outflow narrowing  Operative details: After being informed consent, the patient was taken to the [facility] lab. The patient was placed in supine position on the Angio table. Patient's left upper extremity was prepped and draped in usual sterile fashion. Local anesthesia was obtained of the proximal aspect of the fistula. Ultrasound was used to identify the fistula. The fistula was cannulated under ultrasound guidance using a micropuncture needle. The micro-puncture wire was placed through the needle and the needle removed and the micro-French sheath placed over this. The guidewire was then removed. Contrast Angio Phillip Heal was then performed to the micropuncture sheath. The central venous structures are all widely patent including the axillary subclavian innominate and superior vena cava. The midportion the fistula is widely patent with a caliber of 8.8 approaching 9 mm in diameter. He was a size caliber change over the first 3 cm of the fistula but this is widely patent and the arterial anastomosis is widely patent as well. At this point a figure-of-eight suture was placed around the micropuncture shape and this was removed and hemostasis obtained with direct pressure. The patient also complained of irritation from the sutures of her pre-existing right-sided dialysis catheter. Therefore these were prepped with chlorhexidine. They were both removed with scissors. 2 new sutures were placed in the skin to hold the catheter in place using interrupted 3-0 nylon sutures. The patient tolerated procedure well and there were no complications. Instrument sponge and needle counts correct in the case. The patient was taken to the holding  area in stable condition.  Operative management: The patient's fistula is ready for cannulation next week. Her were no obvious technical issues to suggest why the fistula has not had successful cannulation. Please feel free to refer the patient back for further evaluation if there are continued problems with cannulation.  [name], MD [facility] Office: [phone] Pager: [phone]

## 2016-06-14 NOTE — H&P (View-Only) (Signed)
CC: ESRD   HPI: Briana [name] is a 71 y.o. female who has been on HD through  functioning Hemodialysis right tunneled catheter.  She has access in the left upper extremity that has not worked well for trial of HD.  The have been unable to use it and have back walled the fistula twice per the patient.   Otherwise her cathter is working fine.  She has no new medical complaints since her last visit.    Past Medical History:  Diagnosis Date  . Adenomatous colon polyp 09/2007  . Arthritis   . Atrial fibrillation with rapid ventricular response ([place]) 10/05/2015  . CAD (coronary artery disease)    a. s/p CABG 2005.  . Carotid artery disease ([place])    a. s/p R CEA 2011. Followed by vascular -  duplex 04/16/16: [id number] RICA, [id number] LICA.  Marland Kitchen Chronic anemia   . Chronic diastolic CHF (congestive heart failure) ([place])   . Depression with anxiety   . Diabetes mellitus   . ESRD (end stage renal disease) ([place])   . Heart murmur   . Hyperlipidemia   . Hypertension   . Neuropathy ([place])   . PAD (peripheral artery disease) ([place])   . Paroxysmal atrial flutter ([place])   . Peripheral vascular disease ([place])   . Pulmonary hypertension   . Rheumatic fever   . Sleep apnea     Family Hx Family History  Problem Relation Age of Onset  . Breast cancer Mother   . Heart disease Father   . Diabetes Father   . Heart disease Brother     Social HX Social History  Substance Use Topics  . Smoking status: Never Smoker  . Smokeless tobacco: Never Used  . Alcohol use No    Allergies Allergies  Allergen Reactions  . Oxycodone Other (See Comments)    hallucinations  . Clonazepam Other (See Comments)    Unknown reaction  . Gemfibrozil Other (See Comments)    Patient is not aware of this allergy but states that she has side effects to a cholesterol medication in the past  . Carbamazepine Other (See Comments)    Reaction to tegretol - loopy  . Macrobid [Nitrofurantoin] Nausea And Vomiting     Medications Current Outpatient Prescriptions  Medication Sig Dispense Refill  . amLODipine (NORVASC) 10 MG tablet Take 1 tablet (10 mg total) by mouth daily. 30 tablet 3  . aspirin EC 81 MG EC tablet Take 1 tablet (81 mg total) by mouth daily. 30 tablet 1  . atorvastatin (LIPITOR) 80 MG tablet Take 1 tablet (80 mg total) by mouth daily. 30 tablet 3  . calcium acetate (PHOSLO) 667 MG capsule Take 2 capsules (1,334 mg total) by mouth 3 (three) times daily with meals. 180 capsule 1  . cetirizine (ZYRTEC) 10 MG tablet Take 10 mg by mouth at bedtime.    . diazepam (VALIUM) 5 MG tablet Take 5 mg by mouth every 6 (six) hours as needed for anxiety.    . ferric citrate (AURYXIA) 1 GM 210 MG(Fe) tablet Take 420 mg by mouth 3 (three) times daily with meals.    Marland Kitchen HYDROcodone-acetaminophen (NORCO/VICODIN) 5-325 MG tablet Take 1 tablet by mouth every 6 (six) hours as needed for moderate pain (Must last 30 days.Do not take and drive a car or use machinery.). 110 tablet 0  . hydrocortisone (ANUSOL-HC) 2.5 % rectal cream Place rectally 2 (two) times daily as needed for hemorrhoids or itching. 30 g  0  . insulin NPH-regular Human (NOVOLIN 70/30) (70-30) 100 UNIT/ML injection Inject 15 Units into the skin 2 (two) times daily with a meal. 50 mL 0  . metoprolol (LOPRESSOR) 50 MG tablet Take 1 tablet (50 mg total) by mouth 2 (two) times daily. 180 tablet 3  . nitroGLYCERIN (NITROSTAT) 0.4 MG SL tablet Place 1 tablet (0.4 mg total) under the tongue every 5 (five) minutes as needed for chest pain. Max three pills 45 tablet 0  . polyethylene glycol (MIRALAX / GLYCOLAX) packet Take 17 g by mouth 2 (two) times daily. 14 each 0  . protein supplement (RESOURCE BENEPROTEIN) POWD Take 6 g by mouth 3 (three) times daily with meals.  0  . rOPINIRole (REQUIP) 0.25 MG tablet Take 1 tablet (0.25 mg total) by mouth at bedtime. 30 tablet 3  . sevelamer carbonate (RENVELA) 800 MG tablet Take 800 mg by mouth 3 (three) times daily  with meals.    . warfarin (COUMADIN) 5 MG tablet Take 1 tablet (5 mg total) by mouth daily at 6 PM. 30 tablet 0   Current Facility-Administered Medications  Medication Dose Route Frequency Provider Last Rate Last Dose  . diclofenac (FLECTOR) 1.3 % 1 patch  1 patch Transdermal BID Sanjuana Kava, MD        Labs COAG Lab Results  Component Value Date   INR 2.0 06/05/2016   INR 2.3 05/22/2016   INR 2.6 05/15/2016   No results found for: PTT  PHYSICAL EXAM  Vitals:   06/07/16 1334  BP: 129/72  Pulse: 99  Resp: 18  Temp: 97.9 F (36.6 C)    General:  WDWN in NAD HENT: WNL Eyes: Pupils equal Pulmonary: normal non-labored breathing  Cardiac: RRR, Skin: normal, no cyanosis, jaundice, pallor or bruising Vascular Exam/Pulses: 2+  pulses in LEFT upper extremity. Extremities without ischemic changes, no Gangrene , no cellulitis; no open wounds;   There is a good thrill and good bruit in the left basilic fistula. Hand grip is 5/5 and sensation in digits is intact;   Impression: This is a 71 y.o. female who has a functioning HD access via catheter on the right IJ  Poor functioning left Brachial basilic fistula  Plan:  We will schedule her for a fistulogram to evaluate and possible intervention of her fistula. She will have to stop her coumadin for 5 days.  Laurence Slate [facility] 06/07/2016 2:42 PM   Dr. Trula Slade is in the office today

## 2016-06-17 ENCOUNTER — Encounter (HOSPITAL_COMMUNITY): Payer: Self-pay | Admitting: Vascular Surgery

## 2016-06-19 ENCOUNTER — Ambulatory Visit (INDEPENDENT_AMBULATORY_CARE_PROVIDER_SITE_OTHER): Payer: Medicare Other | Admitting: *Deleted

## 2016-06-19 DIAGNOSIS — I4891 Unspecified atrial fibrillation: Secondary | ICD-10-CM

## 2016-06-19 LAB — POCT INR: INR: 1.6

## 2016-06-21 ENCOUNTER — Ambulatory Visit (INDEPENDENT_AMBULATORY_CARE_PROVIDER_SITE_OTHER): Payer: Medicare Other | Admitting: Internal Medicine

## 2016-06-21 DIAGNOSIS — I4891 Unspecified atrial fibrillation: Secondary | ICD-10-CM

## 2016-06-21 LAB — POCT INR: INR: 2.2

## 2016-06-26 ENCOUNTER — Ambulatory Visit (INDEPENDENT_AMBULATORY_CARE_PROVIDER_SITE_OTHER): Payer: Medicare Other | Admitting: *Deleted

## 2016-06-26 ENCOUNTER — Ambulatory Visit (INDEPENDENT_AMBULATORY_CARE_PROVIDER_SITE_OTHER): Payer: Medicare Other | Admitting: Orthopaedic Surgery

## 2016-06-26 ENCOUNTER — Encounter: Payer: Self-pay | Admitting: Orthopaedic Surgery

## 2016-06-26 VITALS — BP 114/63 | HR 99 | Temp 97.5°F | Ht 64.0 in | Wt 217.0 lb

## 2016-06-26 DIAGNOSIS — M25572 Pain in left ankle and joints of left foot: Secondary | ICD-10-CM

## 2016-06-26 DIAGNOSIS — I4891 Unspecified atrial fibrillation: Secondary | ICD-10-CM

## 2016-06-26 DIAGNOSIS — E1042 Type 1 diabetes mellitus with diabetic polyneuropathy: Secondary | ICD-10-CM | POA: Diagnosis not present

## 2016-06-26 LAB — POCT INR: INR: 2.9

## 2016-06-26 MED ORDER — HYDROCODONE-ACETAMINOPHEN 5-325 MG PO TABS: 1.0000 | ORAL_TABLET | Freq: Four times a day (QID) | ORAL | 0 refills | Status: DC | PRN

## 2016-06-26 NOTE — Progress Notes (Signed)
Patient Briana [name]:[id number] [name], female DOB:10-Jun-1945, 71 y.o. [id number]  Chief Complaint  Patient presents with  . Follow-up    left ankle pain    HPI  Briana [name] is a 71 y.o. female who has chronic pain of the left ankle.  The injections have not helped.  She has swelling and lateral pain.  She has no new trauma. She has no redness.  The cold weather has made it worse. HPI  Body mass index is 37.25 kg/m.  ROS  Review of Systems  HENT: Negative for congestion.   Respiratory: Negative for cough and shortness of breath.   Cardiovascular: Positive for leg swelling. Negative for chest pain.  Endocrine: Positive for cold intolerance.  Musculoskeletal: Positive for arthralgias, gait problem, joint swelling and myalgias.  Allergic/Immunologic: Positive for environmental allergies.    Past Medical History:  Diagnosis Date  . Adenomatous colon polyp 09/2007  . Arthritis   . Atrial fibrillation with rapid ventricular response ([place]) 10/05/2015  . CAD (coronary artery disease)    a. s/p CABG 2005.  . Carotid artery disease ([place])    a. s/p R CEA 2011. Followed by vascular -  duplex 04/16/16: [id number] RICA, 123456 LICA.  Marland Kitchen Chronic anemia   . Chronic diastolic CHF (congestive heart failure) ([place])   . Depression with anxiety   . Diabetes mellitus   . ESRD (end stage renal disease) ([place])    T Th Sat  . Heart murmur   . Hyperlipidemia   . Hypertension   . Neuropathy ([place])   . PAD (peripheral artery disease) ([place])   . Paroxysmal atrial flutter ([place])   . Peripheral vascular disease ([place])   . Pulmonary hypertension   . Rheumatic fever   . Sleep apnea     Past Surgical History:  Procedure Laterality Date  . BASCILIC VEIN TRANSPOSITION Left 12/04/2015   Procedure: BASILIC VEIN TRANSPOSITION;  Surgeon: Rosetta Posner, MD;  Location: [facility];  Service: Vascular;  Laterality: Left;  . CAROTID ENDARTERECTOMY  10/04/2009   Right CEA  . CORONARY ARTERY BYPASS GRAFT  2005  . PERIPHERAL  VASCULAR CATHETERIZATION N/A 06/14/2016   Procedure: A/V Fistulagram - Left Upper;  Surgeon: Elam Dutch, MD;  Location: [facility];  Service: Cardiovascular;  Laterality: N/A;  . POLYPECTOMY  02/25/2012   Procedure: POLYPECTOMY;  Surgeon: Danie Binder, MD;  Location: [facility];  Service: Endoscopy;  Laterality: N/A;  . TUBAL LIGATION      Family History  Problem Relation Age of Onset  . Breast cancer Mother   . Heart disease Father   . Diabetes Father   . Heart disease Brother     Social History Social History  Substance Use Topics  . Smoking status: Never Smoker  . Smokeless tobacco: Never Used  . Alcohol use No    Allergies  Allergen Reactions  . Oxycodone Other (See Comments)    hallucinations  . Clonazepam Other (See Comments)    Sleepy   . Gemfibrozil Other (See Comments)    Patient is not aware of this allergy but states that she has side effects to a cholesterol medication in the past  . Carbamazepine Other (See Comments)    Reaction to tegretol - loopy  . Macrobid [Nitrofurantoin] Nausea And Vomiting    Current Outpatient Prescriptions  Medication Sig Dispense Refill  . amLODipine (NORVASC) 10 MG tablet Take 1 tablet (10 mg total) by mouth daily. 30 tablet 3  . aspirin EC 81  MG EC tablet Take 1 tablet (81 mg total) by mouth daily. 30 tablet 1  . atorvastatin (LIPITOR) 80 MG tablet Take 1 tablet (80 mg total) by mouth daily. 30 tablet 3  . calcium acetate (PHOSLO) 667 MG capsule Take 2 capsules (1,334 mg total) by mouth 3 (three) times daily with meals. 180 capsule 1  . cetirizine (ZYRTEC) 10 MG tablet Take 10 mg by mouth at bedtime.    . diazepam (VALIUM) 5 MG tablet Take 5 mg by mouth 3 (three) times daily as needed for anxiety.     . enoxaparin (LOVENOX) 100 MG/ML injection Inject 1 mL (100 mg total) into the skin daily. 10 Syringe 1  . ferric citrate (AURYXIA) 1 GM 210 MG(Fe) tablet Take 210 mg by mouth 3 (three) times daily with meals.     Marland Kitchen  HYDROcodone-acetaminophen (NORCO/VICODIN) 5-325 MG tablet Take 1 tablet by mouth every 6 (six) hours as needed for moderate pain (Must last 30 days.Do not take and drive a car or use machinery.). 100 tablet 0  . insulin NPH-regular Human (NOVOLIN 70/30) (70-30) 100 UNIT/ML injection Inject 15 Units into the skin 2 (two) times daily with a meal. 50 mL 0  . metoprolol (LOPRESSOR) 50 MG tablet Take 1 tablet (50 mg total) by mouth 2 (two) times daily. 180 tablet 3  . nitroGLYCERIN (NITROSTAT) 0.4 MG SL tablet Place 1 tablet (0.4 mg total) under the tongue every 5 (five) minutes as needed for chest pain. Max three pills 45 tablet 0  . polyethylene glycol (MIRALAX / GLYCOLAX) packet Take 17 g by mouth 2 (two) times daily. (Patient taking differently: Take 17 g by mouth daily as needed for mild constipation. ) 14 each 0  . protein supplement (RESOURCE BENEPROTEIN) POWD Take 6 g by mouth 3 (three) times daily with meals.  0  . rOPINIRole (REQUIP) 0.25 MG tablet Take 1 tablet (0.25 mg total) by mouth at bedtime. 30 tablet 3  . sertraline (ZOLOFT) 100 MG tablet Take 200 mg by mouth daily.    . sevelamer carbonate (RENVELA) 800 MG tablet Take 800 mg by mouth 3 (three) times daily with meals.    . warfarin (COUMADIN) 5 MG tablet Take 1 tablet (5 mg total) by mouth daily at 6 PM. 30 tablet 0   Current Facility-Administered Medications  Medication Dose Route Frequency Provider Last Rate Last Dose  . diclofenac (FLECTOR) 1.3 % 1 patch  1 patch Transdermal BID Sanjuana Kava, MD         Physical Exam  Blood pressure 114/63, pulse 99, temperature 97.5 F (36.4 C), height 5\' 4"  (1.626 m), weight 217 lb (98.4 kg).  Constitutional: overall normal hygiene, normal nutrition, well developed, normal grooming, normal body habitus. Assistive device:none  Musculoskeletal: gait and station Limp left, muscle tone and strength are normal, no tremors or atrophy is present.  .  Neurological: coordination overall normal.   Deep tendon reflex/nerve stretch intact.  Sensation normal.  Cranial nerves II-XII intact.   Skin:   Normal overall no scars, lesions, ulcers or rashes. No psoriasis.  Psychiatric: Alert and oriented x 3.  Recent memory intact, remote memory unclear.  Normal mood and affect. Well groomed.  Good eye contact.  Cardiovascular: overall no swelling, no varicosities, no edema bilaterally, normal temperatures of the legs and arms, no clubbing, cyanosis and good capillary refill.  Lymphatic: palpation is normal.  Her left ankle has lateral swelling but no redness. ROM is full. She is tender over the  anterior talofibular ligament.  NV intact.  Slight limp to the left.  The patient has been educated about the nature of the problem(s) and counseled on treatment options.  The patient appeared to understand what I have discussed and is in agreement with it.  Encounter Diagnoses  Name Primary?  . Pain in joint, ankle and foot, left Yes  . Diabetic peripheral neuropathy associated with type 1 diabetes mellitus ([place])     PLAN Call if any problems.  Precautions discussed.  Continue current medications.   Return to clinic 3 months   I have reviewed the [place] web site prior to prescribing narcotic medicine for this patient. Electronically Signed Sanjuana Kava, MD 1/31/20181:56 PM

## 2016-07-03 ENCOUNTER — Ambulatory Visit (INDEPENDENT_AMBULATORY_CARE_PROVIDER_SITE_OTHER): Payer: Medicare Other | Admitting: *Deleted

## 2016-07-03 DIAGNOSIS — I4891 Unspecified atrial fibrillation: Secondary | ICD-10-CM

## 2016-07-03 LAB — POCT INR: INR: 2.5

## 2016-07-10 ENCOUNTER — Other Ambulatory Visit: Payer: Self-pay | Admitting: Cardiovascular Disease

## 2016-07-10 ENCOUNTER — Ambulatory Visit (INDEPENDENT_AMBULATORY_CARE_PROVIDER_SITE_OTHER): Payer: Medicare Other | Admitting: *Deleted

## 2016-07-10 ENCOUNTER — Other Ambulatory Visit: Payer: Self-pay | Admitting: Cardiology

## 2016-07-10 DIAGNOSIS — I4891 Unspecified atrial fibrillation: Secondary | ICD-10-CM

## 2016-07-10 LAB — HEMOGLOBIN A1C: HEMOGLOBIN A1C: 8.6

## 2016-07-10 LAB — POCT INR: INR: 2.7

## 2016-07-17 ENCOUNTER — Ambulatory Visit (INDEPENDENT_AMBULATORY_CARE_PROVIDER_SITE_OTHER): Payer: Medicare Other | Admitting: *Deleted

## 2016-07-17 DIAGNOSIS — I4891 Unspecified atrial fibrillation: Secondary | ICD-10-CM

## 2016-07-17 LAB — POCT INR: INR: 4.2

## 2016-07-24 ENCOUNTER — Ambulatory Visit (INDEPENDENT_AMBULATORY_CARE_PROVIDER_SITE_OTHER): Payer: Medicare Other | Admitting: *Deleted

## 2016-07-24 DIAGNOSIS — I4891 Unspecified atrial fibrillation: Secondary | ICD-10-CM

## 2016-07-24 LAB — POCT INR: INR: 2.8

## 2016-07-31 ENCOUNTER — Ambulatory Visit (INDEPENDENT_AMBULATORY_CARE_PROVIDER_SITE_OTHER): Payer: Medicare Other | Admitting: *Deleted

## 2016-07-31 DIAGNOSIS — I4891 Unspecified atrial fibrillation: Secondary | ICD-10-CM

## 2016-07-31 LAB — POCT INR: INR: 3.2

## 2016-08-07 ENCOUNTER — Ambulatory Visit (INDEPENDENT_AMBULATORY_CARE_PROVIDER_SITE_OTHER): Payer: Medicare Other | Admitting: *Deleted

## 2016-08-07 DIAGNOSIS — I4891 Unspecified atrial fibrillation: Secondary | ICD-10-CM

## 2016-08-07 LAB — POCT INR: INR: 2.5

## 2016-08-14 ENCOUNTER — Ambulatory Visit: Payer: Medicare Other | Admitting: Internal Medicine

## 2016-08-14 ENCOUNTER — Ambulatory Visit (INDEPENDENT_AMBULATORY_CARE_PROVIDER_SITE_OTHER): Payer: Medicare Other | Admitting: *Deleted

## 2016-08-14 DIAGNOSIS — I4891 Unspecified atrial fibrillation: Secondary | ICD-10-CM

## 2016-08-14 LAB — POCT INR: INR: 2

## 2016-08-28 ENCOUNTER — Ambulatory Visit (INDEPENDENT_AMBULATORY_CARE_PROVIDER_SITE_OTHER): Payer: Medicare Other | Admitting: *Deleted

## 2016-08-28 DIAGNOSIS — I4891 Unspecified atrial fibrillation: Secondary | ICD-10-CM

## 2016-08-28 LAB — POCT INR: INR: 2.1

## 2016-08-29 ENCOUNTER — Other Ambulatory Visit: Payer: Self-pay | Admitting: *Deleted

## 2016-08-29 ENCOUNTER — Telehealth: Payer: Self-pay | Admitting: Vascular Surgery

## 2016-08-29 DIAGNOSIS — Z01812 Encounter for preprocedural laboratory examination: Secondary | ICD-10-CM

## 2016-08-29 NOTE — Telephone Encounter (Signed)
I scheduled a [facility]A [place] for this patient on 10/11/16 at 10am. They asked she arrive at [facility] Radiology Dept at 9:30am for bloodwork prior. Scheduling phone#[phone]. She is also scheduled to see Dr.Early on 10/15/16 at 4pm. I left VM's for her daughter Marcie Bal and mailed pw information to her as well. awt

## 2016-09-06 ENCOUNTER — Ambulatory Visit: Payer: Medicare Other | Admitting: "Endocrinology

## 2016-09-13 ENCOUNTER — Ambulatory Visit: Payer: Medicare Other | Admitting: "Endocrinology

## 2016-09-17 ENCOUNTER — Ambulatory Visit (INDEPENDENT_AMBULATORY_CARE_PROVIDER_SITE_OTHER): Payer: Medicare Other | Admitting: "Endocrinology

## 2016-09-17 ENCOUNTER — Encounter: Payer: Self-pay | Admitting: "Endocrinology

## 2016-09-17 VITALS — BP 131/84 | HR 101 | Ht 64.0 in | Wt 212.0 lb

## 2016-09-17 DIAGNOSIS — E782 Mixed hyperlipidemia: Secondary | ICD-10-CM | POA: Diagnosis not present

## 2016-09-17 DIAGNOSIS — Z9119 Patient's noncompliance with other medical treatment and regimen: Secondary | ICD-10-CM

## 2016-09-17 DIAGNOSIS — N186 End stage renal disease: Secondary | ICD-10-CM

## 2016-09-17 DIAGNOSIS — E1122 Type 2 diabetes mellitus with diabetic chronic kidney disease: Secondary | ICD-10-CM | POA: Diagnosis not present

## 2016-09-17 DIAGNOSIS — I10 Essential (primary) hypertension: Secondary | ICD-10-CM

## 2016-09-17 DIAGNOSIS — Z91199 Patient's noncompliance with other medical treatment and regimen due to unspecified reason: Secondary | ICD-10-CM | POA: Insufficient documentation

## 2016-09-17 NOTE — Patient Instructions (Signed)

## 2016-09-17 NOTE — Progress Notes (Signed)
Subjective:    Patient ID: Briana [name], female    DOB: Feb 04, 1946,    Past Medical History:  Diagnosis Date  . Adenomatous colon polyp 09/2007  . Arthritis   . Atrial fibrillation with rapid ventricular response ([place]) 10/05/2015  . CAD (coronary artery disease)    a. s/p CABG 2005.  . Carotid artery disease ([place])    a. s/p R CEA 2011. Followed by vascular -  duplex 04/16/16: <27% RICA, 51-70% LICA.  Marland Kitchen Chronic anemia   . Chronic diastolic CHF (congestive heart failure) ([place])   . Depression with anxiety   . Diabetes mellitus   . ESRD (end stage renal disease) ([place])    T Th Sat  . Heart murmur   . Hyperlipidemia   . Hypertension   . Neuropathy   . PAD (peripheral artery disease) ([place])   . Paroxysmal atrial flutter ([place])   . Peripheral vascular disease ([place])   . Pulmonary hypertension ([place])   . Rheumatic fever   . Sleep apnea    Past Surgical History:  Procedure Laterality Date  . BASCILIC VEIN TRANSPOSITION Left 12/04/2015   Procedure: BASILIC VEIN TRANSPOSITION;  Surgeon: Rosetta Posner, MD;  Location: [facility];  Service: Vascular;  Laterality: Left;  . CAROTID ENDARTERECTOMY  10/04/2009   Right CEA  . CORONARY ARTERY BYPASS GRAFT  2005  . PERIPHERAL VASCULAR CATHETERIZATION N/A 06/14/2016   Procedure: A/V Fistulagram - Left Upper;  Surgeon: Elam Dutch, MD;  Location: [facility];  Service: Cardiovascular;  Laterality: N/A;  . POLYPECTOMY  02/25/2012   Procedure: POLYPECTOMY;  Surgeon: Danie Binder, MD;  Location: [facility];  Service: Endoscopy;  Laterality: N/A;  . TUBAL LIGATION     Social History   Social History  . Marital status: Married    Spouse name: N/A  . Number of children: N/A  . Years of education: N/A   Social History Main Topics  . Smoking status: Never Smoker  . Smokeless tobacco: Never Used  . Alcohol use No  . Drug use: No  . Sexual activity: Not Asked   Other Topics Concern  . None   Social History Narrative  . None    Outpatient Encounter Prescriptions as of 09/17/2016  Medication Sig  . Insulin Lispro Prot & Lispro (HUMALOG MIX 75/25 KWIKPEN [place]) Inject 30 Units into the skin 2 (two) times daily with a meal.  . amLODipine (NORVASC) 10 MG tablet Take 1 tablet (10 mg total) by mouth daily.  Marland Kitchen aspirin EC 81 MG EC tablet Take 1 tablet (81 mg total) by mouth daily.  Marland Kitchen atorvastatin (LIPITOR) 80 MG tablet Take 1 tablet (80 mg total) by mouth daily.  . calcium acetate (PHOSLO) 667 MG capsule Take 2 capsules (1,334 mg total) by mouth 3 (three) times daily with meals.  . cetirizine (ZYRTEC) 10 MG tablet Take 10 mg by mouth at bedtime.  . diazepam (VALIUM) 5 MG tablet Take 5 mg by mouth 3 (three) times daily as needed for anxiety.   . enoxaparin (LOVENOX) 100 MG/ML injection Inject 1 mL (100 mg total) into the skin daily.  . ferric citrate (AURYXIA) 1 GM 210 MG(Fe) tablet Take 210 mg by mouth 3 (three) times daily with meals.   Marland Kitchen HYDROcodone-acetaminophen (NORCO/VICODIN) 5-325 MG tablet Take 1 tablet by mouth every 6 (six) hours as needed for moderate pain (Must last 30 days.Do not take and drive a car or use machinery.).  Marland Kitchen metoprolol (LOPRESSOR)  50 MG tablet Take 1 tablet (50 mg total) by mouth 2 (two) times daily.  . nitroGLYCERIN (NITROSTAT) 0.4 MG SL tablet Place 1 tablet (0.4 mg total) under the tongue every 5 (five) minutes as needed for chest pain. Max three pills  . polyethylene glycol (MIRALAX / GLYCOLAX) packet Take 17 g by mouth 2 (two) times daily. (Patient taking differently: Take 17 g by mouth daily as needed for mild constipation. )  . protein supplement (RESOURCE BENEPROTEIN) POWD Take 6 g by mouth 3 (three) times daily with meals.  Marland Kitchen rOPINIRole (REQUIP) 0.25 MG tablet Take 1 tablet (0.25 mg total) by mouth at bedtime.  . sertraline (ZOLOFT) 100 MG tablet Take 200 mg by mouth daily.  . sevelamer carbonate (RENVELA) 800 MG tablet Take 800 mg by mouth 3 (three) times daily with meals.  . warfarin  (COUMADIN) 5 MG tablet Take 1 tablet (5 mg total) by mouth daily at 6 PM.  . warfarin (COUMADIN) 5 MG tablet TAKE AS DIRECTED  . [DISCONTINUED] insulin NPH-regular Human (NOVOLIN 70/30) (70-30) 100 UNIT/ML injection Inject 15 Units into the skin 2 (two) times daily with a meal. (Patient taking differently: Inject 30 Units into the skin 2 (two) times daily with a meal. )   Facility-Administered Encounter Medications as of 09/17/2016  Medication  . diclofenac (FLECTOR) 1.3 % 1 patch   ALLERGIES: Allergies  Allergen Reactions  . Oxycodone Other (See Comments)    hallucinations  . Clonazepam Other (See Comments)    Sleepy   . Gemfibrozil Other (See Comments)    Patient is not aware of this allergy but states that she has side effects to a cholesterol medication in the past  . Carbamazepine Other (See Comments)    Reaction to tegretol - loopy  . Macrobid [Nitrofurantoin] Nausea And Vomiting   VACCINATION STATUS: Immunization History  Administered Date(s) Administered  . Influenza,inj,Quad PF,36+ Mos 03/03/2014    Diabetes  She presents for her follow-up diabetic visit. She has type 2 diabetes mellitus. Onset time: She was diagnosed at approximate age of 41 years. Her disease course has been worsening. There are no hypoglycemic associated symptoms. Pertinent negatives for hypoglycemia include no confusion, headaches, pallor or seizures. There are no diabetic associated symptoms. Pertinent negatives for diabetes include no chest pain, no fatigue, no polydipsia, no polyphagia and no polyuria. There are no hypoglycemic complications. Symptoms are worsening. Diabetic complications include nephropathy. Risk factors for coronary artery disease include dyslipidemia, diabetes mellitus, hypertension, obesity and sedentary lifestyle. Current diabetic treatment includes insulin injections. She is compliant with treatment most of the time. Her weight is stable. She is following a generally unhealthy diet.  She has not had a previous visit with a dietitian (She declined dietitian visit.). She rarely participates in exercise. Home blood sugar record trend: Unfortunately she did not bring her meter nor log this time. Her overall blood glucose range is >200 mg/dl. An ACE inhibitor/angiotensin II receptor blocker is being taken. Eye exam is current.  Hyperlipidemia  This is a chronic problem. The current episode started more than 1 year ago. Exacerbating diseases include diabetes and obesity. Pertinent negatives include no chest pain, myalgias or shortness of breath. Current antihyperlipidemic treatment includes statins. Risk factors for coronary artery disease include diabetes mellitus, dyslipidemia, hypertension, obesity and a sedentary lifestyle.  Hypertension  This is a chronic problem. The current episode started more than 1 year ago. The problem is uncontrolled. Pertinent negatives include no chest pain, headaches, palpitations or shortness of  breath. Past treatments include ACE inhibitors and calcium channel blockers. Hypertensive end-organ damage includes kidney disease.     Review of Systems  Constitutional: Negative for fatigue and unexpected weight change.  HENT: Negative for trouble swallowing and voice change.   Eyes: Negative for visual disturbance.  Respiratory: Negative for cough, shortness of breath and wheezing.   Cardiovascular: Negative for chest pain, palpitations and leg swelling.  Gastrointestinal: Negative for diarrhea, nausea and vomiting.  Endocrine: Negative for cold intolerance, heat intolerance, polydipsia, polyphagia and polyuria.  Musculoskeletal: Negative for arthralgias and myalgias.  Skin: Negative for color change, pallor, rash and wound.  Neurological: Negative for seizures and headaches.  Psychiatric/Behavioral: Negative for confusion and suicidal ideas.    Objective:    BP 131/84   Pulse (!) 101   Ht 5\' 4"  (1.626 m)   Wt 212 lb (96.2 kg)   BMI 36.39 kg/m    Wt Readings from Last 3 Encounters:  09/17/16 212 lb (96.2 kg)  06/26/16 217 lb (98.4 kg)  06/14/16 213 lb (96.6 kg)    Physical Exam  Constitutional: She is oriented to person, place, and time. She appears well-developed.  HENT:  Head: Normocephalic and atraumatic.  Eyes: EOM are normal.  Neck: Normal range of motion. Neck supple. No tracheal deviation present. No thyromegaly present.  Cardiovascular: Normal rate and regular rhythm.   Pulmonary/Chest: Effort normal and breath sounds normal.  Abdominal: Soft. Bowel sounds are normal. There is no tenderness. There is no guarding.  Musculoskeletal: Normal range of motion. She exhibits no edema.  Neurological: She is alert and oriented to person, place, and time. She has normal reflexes. No cranial nerve deficit. Coordination normal.  Skin: Skin is warm and dry. No rash noted. No erythema. No pallor.  Psychiatric: She has a normal mood and affect. Judgment normal.    Results for orders placed or performed in visit on 09/17/16  Hemoglobin A1c  Result Value Ref Range   Hemoglobin A1C 8.6    Diabetic Labs (most recent): Lab Results  Component Value Date   HGBA1C 8.6 07/10/2016   HGBA1C 6.9 (H) 09/15/2015   HGBA1C 6.7 (H) 05/31/2015    Lipid Panel     Component Value Date/Time   CHOL  04/30/2009 0323    142        ATP III CLASSIFICATION:  <200     mg/dL   Desirable  200-239  mg/dL   Borderline High  >=240    mg/dL   High          TRIG 151 (H) 04/30/2009 0323   HDL 41 04/30/2009 0323   CHOLHDL 3.5 04/30/2009 0323   VLDL 30 04/30/2009 0323   LDLCALC  04/30/2009 0323    71        Total Cholesterol/HDL:CHD Risk Coronary Heart Disease Risk Table                     Men   Women  1/2 Average Risk   3.4   3.3  Average Risk       5.0   4.4  2 X Average Risk   9.6   7.1  3 X Average Risk  23.4   11.0        Use the calculated Patient Ratio above and the CHD Risk Table to determine the patient's CHD Risk.        ATP III  CLASSIFICATION (LDL):  <100     mg/dL   Optimal  100-129  mg/dL   Near or Above                    Optimal  130-159  mg/dL   Borderline  160-189  mg/dL   High  >190     mg/dL   Very High     Assessment & Plan:   1. Type 2 diabetes mellitus with  ESRD on HD, CAD, with long-term current use of insulin (HCC)  -Her diabetes is  complicated by chronic kidney disease and patient remains at a high risk for more acute and chronic complications of diabetes which include CAD, CVA, CKD, retinopathy, and neuropathy. These are all discussed in detail with the patient.  - She missed her appointments since February 2017. In the interval, she developed end-stage renal disease currently on dialysis.  - Her A1c has increased to 8.6% from 6.7% last visit.  - She came with incomplete logs showing average blood glucose between 180 and 200. - I have re-counseled the patient on diet management and weight loss  by adopting a carbohydrate restricted / protein rich  Diet.  - Suggestion is made for patient to avoid simple carbohydrates   from their diet including Cakes , Desserts, Ice Cream,  Soda (  diet and regular) , Sweet Tea , Candies,  Chips, Cookies, Artificial Sweeteners,   and "Sugar-free" Products .  This will help patient to have stable blood glucose profile and potentially avoid unintended  Weight gain.  - Patient is advised to stick to a routine mealtimes to eat 3 meals  a day and avoid unnecessary snacks ( to snack only to correct hypoglycemia).  - The patient has declined a visit with CDE for individualized DM education.  - I have approached patient with the following individualized plan to manage diabetes and patient agrees.  - In the interval, she was switched to Humalog 75/25, advised her to continue 30 units with breakfast and 30 units with supper when glucose is above 90, associated with strict monitoring of glucose AC and HS.  -Patient is warned not to take insulin without proper monitoring  per orders.  -Patient is encouraged to call clinic for blood glucose levels less than 70 or above 300 mg /dl. - She once again has declined referral to CDE. -Patient is not a candidate for MTF, SGLT2 i due to CKD. - Insulin is the  exclusive safe choice of therapy for her at this time.  - Patient specific target  for A1c; LDL, HDL, Triglycerides, and  Waist Circumference were discussed in detail.  2) BP/HTN: uncontrolled,advised her to Continue current medications including ACEI/ARB. 3) Lipids/HPL:  continue statins. 4)  Weight/Diet: declined CDE consult, exercise, and carbohydrates information provided.   5. Vitamin D deficiency -I advised her to continue with vitamin D 2000 units daily.  6) Chronic Care/Health Maintenance:  -Patient is on ACEI/ARB and Statin medications and encouraged to continue to follow up with Ophthalmology, Podiatrist at least yearly or according to recommendations, and advised to  stay away from smoking. I have recommended yearly flu vaccine and pneumonia vaccination at least every 5 years; moderate intensity exercise for up to 150 minutes weekly; and  sleep for at least 7 hours a day.  I advised patient to maintain close follow up with their PCP for primary care needs.  Patient is asked to bring meter and  blood glucose logs during their next visit.   Follow up plan: Return in about 1 week (around 09/24/2016)  for follow up with meter and logs- no labs.  Glade Lloyd, MD Phone: [phone]  Fax: [phone]   09/17/2016, 2:49 PM

## 2016-09-18 ENCOUNTER — Ambulatory Visit (INDEPENDENT_AMBULATORY_CARE_PROVIDER_SITE_OTHER): Payer: Medicare Other | Admitting: Cardiovascular Disease

## 2016-09-18 ENCOUNTER — Ambulatory Visit (INDEPENDENT_AMBULATORY_CARE_PROVIDER_SITE_OTHER): Payer: Medicare Other | Admitting: Orthopaedic Surgery

## 2016-09-18 ENCOUNTER — Encounter: Payer: Self-pay | Admitting: Cardiovascular Disease

## 2016-09-18 ENCOUNTER — Ambulatory Visit (INDEPENDENT_AMBULATORY_CARE_PROVIDER_SITE_OTHER): Payer: Medicare Other | Admitting: *Deleted

## 2016-09-18 ENCOUNTER — Other Ambulatory Visit: Payer: Self-pay

## 2016-09-18 ENCOUNTER — Encounter: Payer: Self-pay | Admitting: Orthopaedic Surgery

## 2016-09-18 VITALS — BP 108/61 | HR 80 | Temp 96.8°F | Ht 64.0 in | Wt 216.0 lb

## 2016-09-18 VITALS — BP 110/60 | HR 98 | Ht 64.0 in | Wt 214.0 lb

## 2016-09-18 DIAGNOSIS — I4892 Unspecified atrial flutter: Secondary | ICD-10-CM

## 2016-09-18 DIAGNOSIS — I25708 Atherosclerosis of coronary artery bypass graft(s), unspecified, with other forms of angina pectoris: Secondary | ICD-10-CM

## 2016-09-18 DIAGNOSIS — I495 Sick sinus syndrome: Secondary | ICD-10-CM

## 2016-09-18 DIAGNOSIS — E7800 Pure hypercholesterolemia, unspecified: Secondary | ICD-10-CM | POA: Diagnosis not present

## 2016-09-18 DIAGNOSIS — I209 Angina pectoris, unspecified: Secondary | ICD-10-CM

## 2016-09-18 DIAGNOSIS — E1042 Type 1 diabetes mellitus with diabetic polyneuropathy: Secondary | ICD-10-CM | POA: Diagnosis not present

## 2016-09-18 DIAGNOSIS — I10 Essential (primary) hypertension: Secondary | ICD-10-CM | POA: Diagnosis not present

## 2016-09-18 DIAGNOSIS — N186 End stage renal disease: Secondary | ICD-10-CM

## 2016-09-18 DIAGNOSIS — I4891 Unspecified atrial fibrillation: Secondary | ICD-10-CM | POA: Diagnosis not present

## 2016-09-18 DIAGNOSIS — I480 Paroxysmal atrial fibrillation: Secondary | ICD-10-CM

## 2016-09-18 DIAGNOSIS — I5032 Chronic diastolic (congestive) heart failure: Secondary | ICD-10-CM

## 2016-09-18 DIAGNOSIS — M25572 Pain in left ankle and joints of left foot: Secondary | ICD-10-CM

## 2016-09-18 LAB — POCT INR: INR: 1.8

## 2016-09-18 MED ORDER — BLOOD GLUCOSE MONITOR KIT: PACK | 0 refills | Status: DC

## 2016-09-18 MED ORDER — METOPROLOL TARTRATE 50 MG PO TABS: 50.0000 mg | ORAL_TABLET | Freq: Two times a day (BID) | ORAL | 3 refills | Status: DC

## 2016-09-18 MED ORDER — HYDROCODONE-ACETAMINOPHEN 5-325 MG PO TABS: 1.0000 | ORAL_TABLET | Freq: Four times a day (QID) | ORAL | 0 refills | Status: DC | PRN

## 2016-09-18 NOTE — Progress Notes (Signed)
Patient Briana [name]:GGEZM [name], female DOB:[birth year], 71 y.o. OQH:[id number]  Chief Complaint  Patient presents with  . Ankle Pain    left    HPI  Briana [name] is a 71 y.o. female who has long standing chronic left ankle pain.  She has no new trauma, no redness, no swelling.  She gets dialysis every other day. HPI  Body mass index is 37.08 kg/m.  ROS  Review of Systems  HENT: Negative for congestion.   Respiratory: Negative for cough and shortness of breath.   Cardiovascular: Positive for leg swelling. Negative for chest pain.  Endocrine: Positive for cold intolerance.  Musculoskeletal: Positive for arthralgias, gait problem, joint swelling and myalgias.  Allergic/Immunologic: Positive for environmental allergies.    Past Medical History:  Diagnosis Date  . Adenomatous colon polyp 09/2007  . Arthritis   . Atrial fibrillation with rapid ventricular response ([place]) 10/05/2015  . CAD (coronary artery disease)    a. s/p CABG 2005.  . Carotid artery disease ([place])    a. s/p R CEA 2011. Followed by vascular -  duplex 04/16/16: <54% RICA, 65-68% LICA.  Marland Kitchen Chronic anemia   . Chronic diastolic CHF (congestive heart failure) ([place])   . Depression with anxiety   . Diabetes mellitus   . ESRD (end stage renal disease) ([place])    T Th Sat  . Heart murmur   . Hyperlipidemia   . Hypertension   . Neuropathy   . PAD (peripheral artery disease) ([place])   . Paroxysmal atrial flutter ([place])   . Peripheral vascular disease ([place])   . Pulmonary hypertension ([place])   . Rheumatic fever   . Sleep apnea     Past Surgical History:  Procedure Laterality Date  . BASCILIC VEIN TRANSPOSITION Left 12/04/2015   Procedure: BASILIC VEIN TRANSPOSITION;  Surgeon: Rosetta Posner, MD;  Location: [facility];  Service: Vascular;  Laterality: Left;  . CAROTID ENDARTERECTOMY  10/04/2009   Right CEA  . CORONARY ARTERY BYPASS GRAFT  2005  . PERIPHERAL VASCULAR CATHETERIZATION N/A 06/14/2016   Procedure: A/V Fistulagram  - Left Upper;  Surgeon: Elam Dutch, MD;  Location: [facility];  Service: Cardiovascular;  Laterality: N/A;  . POLYPECTOMY  02/25/2012   Procedure: POLYPECTOMY;  Surgeon: Danie Binder, MD;  Location: [facility];  Service: Endoscopy;  Laterality: N/A;  . TUBAL LIGATION      Family History  Problem Relation Age of Onset  . Breast cancer Mother   . Heart disease Father   . Diabetes Father   . Heart disease Brother     Social History Social History  Substance Use Topics  . Smoking status: Never Smoker  . Smokeless tobacco: Never Used  . Alcohol use No    Allergies  Allergen Reactions  . Oxycodone Other (See Comments)    hallucinations  . Clonazepam Other (See Comments)    Sleepy   . Gemfibrozil Other (See Comments)    Patient is not aware of this allergy but states that she has side effects to a cholesterol medication in the past  . Carbamazepine Other (See Comments)    Reaction to tegretol - loopy  . Macrobid [Nitrofurantoin] Nausea And Vomiting    Current Outpatient Prescriptions  Medication Sig Dispense Refill  . amLODipine (NORVASC) 10 MG tablet Take 1 tablet (10 mg total) by mouth daily. 30 tablet 3  . aspirin EC 81 MG EC tablet Take 1 tablet (81 mg total) by mouth daily. 30 tablet 1  .  atorvastatin (LIPITOR) 80 MG tablet Take 1 tablet (80 mg total) by mouth daily. 30 tablet 3  . blood glucose meter kit and supplies KIT Dispense based on patient and insurance preference. Use up to four times daily as directed. (FOR ICD-10 E11.65) 1 each 0  . calcium acetate (PHOSLO) 667 MG capsule Take 2 capsules (1,334 mg total) by mouth 3 (three) times daily with meals. 180 capsule 1  . cetirizine (ZYRTEC) 10 MG tablet Take 10 mg by mouth at bedtime.    . diazepam (VALIUM) 5 MG tablet Take 5 mg by mouth 3 (three) times daily as needed for anxiety.     . enoxaparin (LOVENOX) 100 MG/ML injection Inject 1 mL (100 mg total) into the skin daily. 10 Syringe 1  . ferric citrate  (AURYXIA) 1 GM 210 MG(Fe) tablet Take 210 mg by mouth 3 (three) times daily with meals.     Marland Kitchen HYDROcodone-acetaminophen (NORCO/VICODIN) 5-325 MG tablet Take 1 tablet by mouth every 6 (six) hours as needed for moderate pain (Must last 30 days.Do not take and drive a car or use machinery.). 100 tablet 0  . Insulin Lispro Prot & Lispro (HUMALOG MIX 75/25 KWIKPEN [place]) Inject 30 Units into the skin 2 (two) times daily with a meal.    . metoprolol (LOPRESSOR) 50 MG tablet Take 1 tablet (50 mg total) by mouth 2 (two) times daily. 180 tablet 3  . nitroGLYCERIN (NITROSTAT) 0.4 MG SL tablet Place 1 tablet (0.4 mg total) under the tongue every 5 (five) minutes as needed for chest pain. Max three pills 45 tablet 0  . polyethylene glycol (MIRALAX / GLYCOLAX) packet Take 17 g by mouth 2 (two) times daily. (Patient taking differently: Take 17 g by mouth daily as needed for mild constipation. ) 14 each 0  . protein supplement (RESOURCE BENEPROTEIN) POWD Take 6 g by mouth 3 (three) times daily with meals.  0  . rOPINIRole (REQUIP) 0.25 MG tablet Take 1 tablet (0.25 mg total) by mouth at bedtime. 30 tablet 3  . sertraline (ZOLOFT) 100 MG tablet Take 200 mg by mouth daily.    . sevelamer carbonate (RENVELA) 800 MG tablet Take 800 mg by mouth 3 (three) times daily with meals.    . warfarin (COUMADIN) 5 MG tablet Take 1 tablet (5 mg total) by mouth daily at 6 PM. 30 tablet 0  . warfarin (COUMADIN) 5 MG tablet TAKE AS DIRECTED 90 tablet 3   Current Facility-Administered Medications  Medication Dose Route Frequency Provider Last Rate Last Dose  . diclofenac (FLECTOR) 1.3 % 1 patch  1 patch Transdermal BID Sanjuana Kava, MD         Physical Exam  Blood pressure 108/61, pulse 80, temperature (!) 96.8 F (36 C), height '5\' 4"'  (1.626 m), weight 216 lb (98 kg).  Constitutional: overall normal hygiene, normal nutrition, well developed, normal grooming, normal body habitus. Assistive device:none  Musculoskeletal: gait  and station Limp left, muscle tone and strength are normal, no tremors or atrophy is present.  .  Neurological: coordination overall normal.  Deep tendon reflex/nerve stretch intact.  Sensation normal.  Cranial nerves II-XII intact.   Skin:   Normal overall no scars, lesions, ulcers or rashes. No psoriasis.  Psychiatric: Alert and oriented x 3.  Recent memory intact, remote memory unclear.  Normal mood and affect. Well groomed.  Good eye contact.  Cardiovascular: overall no swelling, no varicosities, no edema bilaterally, normal temperatures of the legs and arms, no clubbing, cyanosis  and good capillary refill.  Lymphatic: palpation is normal.  Left ankle is tender, more anteriorly.  There is no swelling or redness,  ROM is full.  NV intact.     The patient has been educated about the nature of the problem(s) and counseled on treatment options.  The patient appeared to understand what I have discussed and is in agreement with it.  Encounter Diagnoses  Name Primary?  . Pain in joint, ankle and foot, left Yes  . Diabetic peripheral neuropathy associated with type 1 diabetes mellitus ([place])     PLAN Call if any problems.  Precautions discussed.  Continue current medications.   Return to clinic 3 months   I have reviewed the [place] web site prior to prescribing narcotic medicine for this patient. Electronically Signed Sanjuana Kava, MD 4/25/20181:42 PM

## 2016-09-18 NOTE — Patient Instructions (Signed)

## 2016-09-18 NOTE — Progress Notes (Signed)
SUBJECTIVE: The patient presents for routine follow-up. She has a history of coronary artery disease and CABG as well as paroxysmal atrial fibrillation, hypertension, and end-stage renal disease and is on dialysis. She also has recurrent atrial arrhythmias for which she has seen EP. She had been on amiodarone which was stopped and is on metoprolol.  Carotid Dopplers on 04/17/16 showed less than 40% right and 40-59% left internal carotid artery stenosis.  She denies chest pain, palpitations, and shortness of breath. She has had no bleeding problems with warfarin. She is scheduled to see Dr. Lovena Le on June 21. Blood pressure is controlled.   Review of Systems: As per "subjective", otherwise negative.  Allergies  Allergen Reactions  . Oxycodone Other (See Comments)    hallucinations  . Clonazepam Other (See Comments)    Sleepy   . Gemfibrozil Other (See Comments)    Patient is not aware of this allergy but states that she has side effects to a cholesterol medication in the past  . Carbamazepine Other (See Comments)    Reaction to tegretol - loopy  . Macrobid [Nitrofurantoin] Nausea And Vomiting    Current Outpatient Prescriptions  Medication Sig Dispense Refill  . amLODipine (NORVASC) 10 MG tablet Take 1 tablet (10 mg total) by mouth daily. 30 tablet 3  . aspirin EC 81 MG EC tablet Take 1 tablet (81 mg total) by mouth daily. 30 tablet 1  . atorvastatin (LIPITOR) 80 MG tablet Take 1 tablet (80 mg total) by mouth daily. 30 tablet 3  . blood glucose meter kit and supplies KIT Dispense based on patient and insurance preference. Use up to four times daily as directed. (FOR ICD-10 E11.65) 1 each 0  . calcium acetate (PHOSLO) 667 MG capsule Take 2 capsules (1,334 mg total) by mouth 3 (three) times daily with meals. 180 capsule 1  . cetirizine (ZYRTEC) 10 MG tablet Take 10 mg by mouth at bedtime.    . diazepam (VALIUM) 5 MG tablet Take 5 mg by mouth 3 (three) times daily as needed for  anxiety.     . enoxaparin (LOVENOX) 100 MG/ML injection Inject 1 mL (100 mg total) into the skin daily. 10 Syringe 1  . ferric citrate (AURYXIA) 1 GM 210 MG(Fe) tablet Take 210 mg by mouth 3 (three) times daily with meals.     Marland Kitchen HYDROcodone-acetaminophen (NORCO/VICODIN) 5-325 MG tablet Take 1 tablet by mouth every 6 (six) hours as needed for moderate pain (Must last 30 days.Do not take and drive a car or use machinery.). 100 tablet 0  . Insulin Lispro Prot & Lispro (HUMALOG MIX 75/25 KWIKPEN [place]) Inject 30 Units into the skin 2 (two) times daily with a meal.    . metoprolol (LOPRESSOR) 50 MG tablet Take 1 tablet (50 mg total) by mouth 2 (two) times daily. 180 tablet 3  . nitroGLYCERIN (NITROSTAT) 0.4 MG SL tablet Place 1 tablet (0.4 mg total) under the tongue every 5 (five) minutes as needed for chest pain. Max three pills 45 tablet 0  . polyethylene glycol (MIRALAX / GLYCOLAX) packet Take 17 g by mouth 2 (two) times daily. (Patient taking differently: Take 17 g by mouth daily as needed for mild constipation. ) 14 each 0  . protein supplement (RESOURCE BENEPROTEIN) POWD Take 6 g by mouth 3 (three) times daily with meals.  0  . rOPINIRole (REQUIP) 0.25 MG tablet Take 1 tablet (0.25 mg total) by mouth at bedtime. 30 tablet 3  . sertraline (  ZOLOFT) 100 MG tablet Take 200 mg by mouth daily.    . sevelamer carbonate (RENVELA) 800 MG tablet Take 800 mg by mouth 3 (three) times daily with meals.    . warfarin (COUMADIN) 5 MG tablet Take 1 tablet (5 mg total) by mouth daily at 6 PM. 30 tablet 0  . warfarin (COUMADIN) 5 MG tablet TAKE AS DIRECTED 90 tablet 3   Current Facility-Administered Medications  Medication Dose Route Frequency Provider Last Rate Last Dose  . diclofenac (FLECTOR) 1.3 % 1 patch  1 patch Transdermal BID Sanjuana Kava, MD        Past Medical History:  Diagnosis Date  . Adenomatous colon polyp 09/2007  . Arthritis   . Atrial fibrillation with rapid ventricular response ([place])  10/05/2015  . CAD (coronary artery disease)    a. s/p CABG 2005.  . Carotid artery disease ([place])    a. s/p R CEA 2011. Followed by vascular -  duplex 04/16/16: <42% RICA, 87-68% LICA.  Marland Kitchen Chronic anemia   . Chronic diastolic CHF (congestive heart failure) ([place])   . Depression with anxiety   . Diabetes mellitus   . ESRD (end stage renal disease) ([place])    T Th Sat  . Heart murmur   . Hyperlipidemia   . Hypertension   . Neuropathy   . PAD (peripheral artery disease) ([place])   . Paroxysmal atrial flutter ([place])   . Peripheral vascular disease ([place])   . Pulmonary hypertension ([place])   . Rheumatic fever   . Sleep apnea     Past Surgical History:  Procedure Laterality Date  . BASCILIC VEIN TRANSPOSITION Left 12/04/2015   Procedure: BASILIC VEIN TRANSPOSITION;  Surgeon: Rosetta Posner, MD;  Location: [facility];  Service: Vascular;  Laterality: Left;  . CAROTID ENDARTERECTOMY  10/04/2009   Right CEA  . CORONARY ARTERY BYPASS GRAFT  2005  . PERIPHERAL VASCULAR CATHETERIZATION N/A 06/14/2016   Procedure: A/V Fistulagram - Left Upper;  Surgeon: Elam Dutch, MD;  Location: [facility];  Service: Cardiovascular;  Laterality: N/A;  . POLYPECTOMY  02/25/2012   Procedure: POLYPECTOMY;  Surgeon: Danie Binder, MD;  Location: [facility];  Service: Endoscopy;  Laterality: N/A;  . TUBAL LIGATION      Social History   Social History  . Marital status: Married    Spouse name: N/A  . Number of children: N/A  . Years of education: N/A   Occupational History  . Not on file.   Social History Main Topics  . Smoking status: Never Smoker  . Smokeless tobacco: Never Used  . Alcohol use No  . Drug use: No  . Sexual activity: Not on file   Other Topics Concern  . Not on file   Social History Narrative  . No narrative on file     Vitals:   09/18/16 1132  BP: 110/60  Pulse: 98  SpO2: 96%  Weight: 214 lb (97.1 kg)  Height: '5\' 4"'  (1.626 m)    Wt Readings from Last 3 Encounters:  09/18/16  214 lb (97.1 kg)  09/17/16 212 lb (96.2 kg)  06/26/16 217 lb (98.4 kg)     PHYSICAL EXAM General: NAD HEENT: Normal. Neck: No JVD, no thyromegaly. Lungs: Clear to auscultation bilaterally with normal respiratory effort. CV: Nondisplaced PMI.  Regular rate and irregular rhythm, normal S1/S2, no S3, no murmur. No pretibial or periankle edema.   Abdomen: Soft, nontender, no distention.  Neurologic: Alert and oriented.  Psych:  Normal affect. Skin: Normal. Musculoskeletal: No gross deformities.    ECG: Most recent ECG reviewed.   Labs: Lab Results  Component Value Date/Time   K 4.2 06/14/2016 11:06 AM   BUN 65 (H) 06/14/2016 11:06 AM   CREATININE 5.40 (H) 06/14/2016 11:06 AM   CREATININE 3.50 (H) 04/25/2016 04:14 PM   ALT 14 11/26/2015 04:35 AM   TSH 3.33 04/25/2016 04:14 PM   HGB 12.9 06/14/2016 11:06 AM     Lipids: Lab Results  Component Value Date/Time   LDLCALC  04/30/2009 03:23 AM    71        Total Cholesterol/HDL:CHD Risk Coronary Heart Disease Risk Table                     Men   Women  1/2 Average Risk   3.4   3.3  Average Risk       5.0   4.4  2 X Average Risk   9.6   7.1  3 X Average Risk  23.4   11.0        Use the calculated Patient Ratio above and the CHD Risk Table to determine the patient's CHD Risk.        ATP III CLASSIFICATION (LDL):  <100     mg/dL   Optimal  100-129  mg/dL   Near or Above                    Optimal  130-159  mg/dL   Borderline  160-189  mg/dL   High  >190     mg/dL   Very High   CHOL  04/30/2009 03:23 AM    142        ATP III CLASSIFICATION:  <200     mg/dL   Desirable  200-239  mg/dL   Borderline High  >=240    mg/dL   High          TRIG 151 (H) 04/30/2009 03:23 AM   HDL 41 04/30/2009 03:23 AM       ASSESSMENT AND PLAN: 1. CAD with h/o 3-v CABG in 04/2004: Symptomatically stable. Continue aspirin, Lipitor, and metoprolol. Stress testing in May 2017 showed mild peri-infarct ischemia in the anterior and  anteroseptal walls.  2. Essential HTN: Controlled. No changes to therapy.  3. Hyperlipidemia: Continue high-dose Lipitor.  4. Left internal carotid artery stenosis and s/p right CEA: Carotid Dopplers on 04/17/16 showed less than 40% right and 40-59% left internal carotid artery stenosis.  5. Chronic diastolic heart failure: Euvolemic. Volume managed by dialysis.  6. PAF/tachycardia/atrial flutter: Now follows with EP. Currently on metoprolol and warfarin.    Disposition: Follow up with Dr. Lovena Le on 6/21. Follow up with me in 02/2017.   Kate Sable, M.D., F.A.C.C.

## 2016-09-25 ENCOUNTER — Encounter: Payer: Self-pay | Admitting: "Endocrinology

## 2016-09-25 ENCOUNTER — Ambulatory Visit (INDEPENDENT_AMBULATORY_CARE_PROVIDER_SITE_OTHER): Payer: Medicare Other | Admitting: "Endocrinology

## 2016-09-25 VITALS — BP 117/73 | HR 98 | Ht 64.0 in | Wt 217.0 lb

## 2016-09-25 DIAGNOSIS — I10 Essential (primary) hypertension: Secondary | ICD-10-CM | POA: Diagnosis not present

## 2016-09-25 DIAGNOSIS — N186 End stage renal disease: Secondary | ICD-10-CM | POA: Diagnosis not present

## 2016-09-25 DIAGNOSIS — E782 Mixed hyperlipidemia: Secondary | ICD-10-CM

## 2016-09-25 DIAGNOSIS — Z6836 Body mass index (BMI) 36.0-36.9, adult: Secondary | ICD-10-CM | POA: Diagnosis not present

## 2016-09-25 DIAGNOSIS — E1122 Type 2 diabetes mellitus with diabetic chronic kidney disease: Secondary | ICD-10-CM | POA: Diagnosis not present

## 2016-09-25 DIAGNOSIS — I25708 Atherosclerosis of coronary artery bypass graft(s), unspecified, with other forms of angina pectoris: Secondary | ICD-10-CM

## 2016-09-25 MED ORDER — INSULIN LISPRO PROT & LISPRO (75-25 MIX) 100 UNIT/ML KWIKPEN: PEN_INJECTOR | SUBCUTANEOUS | 2 refills | Status: DC

## 2016-09-25 NOTE — Progress Notes (Signed)
Subjective:    Patient ID: Briana [name], female    DOB: 06/29/1945,    Past Medical History:  Diagnosis Date  . Adenomatous colon polyp 09/2007  . Arthritis   . Atrial fibrillation with rapid ventricular response ([place]) 10/05/2015  . CAD (coronary artery disease)    a. s/p CABG 2005.  . Carotid artery disease ([place])    a. s/p R CEA 2011. Followed by vascular -  duplex 04/16/16: <57% RICA, 32-20% LICA.  Marland Kitchen Chronic anemia   . Chronic diastolic CHF (congestive heart failure) ([place])   . Depression with anxiety   . Diabetes mellitus   . ESRD (end stage renal disease) ([place])    T Th Sat  . Heart murmur   . Hyperlipidemia   . Hypertension   . Neuropathy   . PAD (peripheral artery disease) ([place])   . Paroxysmal atrial flutter ([place])   . Peripheral vascular disease ([place])   . Pulmonary hypertension ([place])   . Rheumatic fever   . Sleep apnea    Past Surgical History:  Procedure Laterality Date  . BASCILIC VEIN TRANSPOSITION Left 12/04/2015   Procedure: BASILIC VEIN TRANSPOSITION;  Surgeon: Rosetta Posner, MD;  Location: [facility];  Service: Vascular;  Laterality: Left;  . CAROTID ENDARTERECTOMY  10/04/2009   Right CEA  . CORONARY ARTERY BYPASS GRAFT  2005  . PERIPHERAL VASCULAR CATHETERIZATION N/A 06/14/2016   Procedure: A/V Fistulagram - Left Upper;  Surgeon: Elam Dutch, MD;  Location: [facility];  Service: Cardiovascular;  Laterality: N/A;  . POLYPECTOMY  02/25/2012   Procedure: POLYPECTOMY;  Surgeon: Danie Binder, MD;  Location: [facility];  Service: Endoscopy;  Laterality: N/A;  . TUBAL LIGATION     Social History   Social History  . Marital status: Married    Spouse name: N/A  . Number of children: N/A  . Years of education: N/A   Social History Main Topics  . Smoking status: Never Smoker  . Smokeless tobacco: Never Used  . Alcohol use No  . Drug use: No  . Sexual activity: Not Asked   Other Topics Concern  . None   Social History Narrative  . None    Outpatient Encounter Prescriptions as of 09/25/2016  Medication Sig  . amLODipine (NORVASC) 10 MG tablet Take 1 tablet (10 mg total) by mouth daily.  Marland Kitchen aspirin EC 81 MG EC tablet Take 1 tablet (81 mg total) by mouth daily.  Marland Kitchen atorvastatin (LIPITOR) 80 MG tablet Take 1 tablet (80 mg total) by mouth daily.  . blood glucose meter kit and supplies KIT Dispense based on patient and insurance preference. Use up to four times daily as directed. (FOR ICD-10 E11.65)  . calcium acetate (PHOSLO) 667 MG capsule Take 2 capsules (1,334 mg total) by mouth 3 (three) times daily with meals.  . cetirizine (ZYRTEC) 10 MG tablet Take 10 mg by mouth at bedtime.  . diazepam (VALIUM) 5 MG tablet Take 5 mg by mouth 3 (three) times daily as needed for anxiety.   . enoxaparin (LOVENOX) 100 MG/ML injection Inject 1 mL (100 mg total) into the skin daily.  . ferric citrate (AURYXIA) 1 GM 210 MG(Fe) tablet Take 210 mg by mouth 3 (three) times daily with meals.   Marland Kitchen HYDROcodone-acetaminophen (NORCO/VICODIN) 5-325 MG tablet Take 1 tablet by mouth every 6 (six) hours as needed for moderate pain (Must last 30 days.Do not take and drive a car or use machinery.).  Marland Kitchen  Insulin Lispro Prot & Lispro (HUMALOG MIX 75/25 KWIKPEN) (75-25) 100 UNIT/ML Kwikpen Used 35 units with breakfast and 30 units with supper when pre-meal blood glucose is above 90 mg/dL.  . metoprolol (LOPRESSOR) 50 MG tablet Take 1 tablet (50 mg total) by mouth 2 (two) times daily.  . nitroGLYCERIN (NITROSTAT) 0.4 MG SL tablet Place 1 tablet (0.4 mg total) under the tongue every 5 (five) minutes as needed for chest pain. Max three pills  . polyethylene glycol (MIRALAX / GLYCOLAX) packet Take 17 g by mouth 2 (two) times daily. (Patient taking differently: Take 17 g by mouth daily as needed for mild constipation. )  . protein supplement (RESOURCE BENEPROTEIN) POWD Take 6 g by mouth 3 (three) times daily with meals.  Marland Kitchen rOPINIRole (REQUIP) 0.25 MG tablet Take 1 tablet (0.25  mg total) by mouth at bedtime.  . sertraline (ZOLOFT) 100 MG tablet Take 200 mg by mouth daily.  . sevelamer carbonate (RENVELA) 800 MG tablet Take 800 mg by mouth 3 (three) times daily with meals.  . warfarin (COUMADIN) 5 MG tablet Take 1 tablet (5 mg total) by mouth daily at 6 PM.  . warfarin (COUMADIN) 5 MG tablet TAKE AS DIRECTED  . [DISCONTINUED] Insulin Lispro Prot & Lispro (HUMALOG MIX 75/25 KWIKPEN [place]) Inject 30-35 Units into the skin 2 (two) times daily with a meal.   Facility-Administered Encounter Medications as of 09/25/2016  Medication  . diclofenac (FLECTOR) 1.3 % 1 patch   ALLERGIES: Allergies  Allergen Reactions  . Oxycodone Other (See Comments)    hallucinations  . Clonazepam Other (See Comments)    Sleepy   . Gemfibrozil Other (See Comments)    Patient is not aware of this allergy but states that she has side effects to a cholesterol medication in the past  . Carbamazepine Other (See Comments)    Reaction to tegretol - loopy  . Macrobid [Nitrofurantoin] Nausea And Vomiting   VACCINATION STATUS: Immunization History  Administered Date(s) Administered  . Influenza,inj,Quad PF,36+ Mos 03/03/2014    Diabetes  She presents for her follow-up diabetic visit. She has type 2 diabetes mellitus. Onset time: She was diagnosed at approximate age of 28 years. Her disease course has been improving. There are no hypoglycemic associated symptoms. Pertinent negatives for hypoglycemia include no confusion, headaches, pallor or seizures. There are no diabetic associated symptoms. Pertinent negatives for diabetes include no chest pain, no fatigue, no polydipsia, no polyphagia and no polyuria. There are no hypoglycemic complications. Symptoms are improving. Diabetic complications include nephropathy. Risk factors for coronary artery disease include dyslipidemia, diabetes mellitus, hypertension, obesity and sedentary lifestyle. Current diabetic treatment includes insulin injections. She is  compliant with treatment most of the time. Her weight is stable. She is following a generally unhealthy diet. She has not had a previous visit with a dietitian (She declined dietitian visit.). She rarely participates in exercise. Her breakfast blood glucose range is generally 130-140 mg/dl. Her lunch blood glucose range is generally 140-180 mg/dl. Her dinner blood glucose range is generally 140-180 mg/dl. Her overall blood glucose range is 140-180 mg/dl. An ACE inhibitor/angiotensin II receptor blocker is being taken. Eye exam is current.  Hyperlipidemia  This is a chronic problem. The current episode started more than 1 year ago. Exacerbating diseases include diabetes and obesity. Pertinent negatives include no chest pain, myalgias or shortness of breath. Current antihyperlipidemic treatment includes statins. Risk factors for coronary artery disease include diabetes mellitus, dyslipidemia, hypertension, obesity and a sedentary lifestyle.  Hypertension  This is a chronic problem. The current episode started more than 1 year ago. The problem is uncontrolled. Pertinent negatives include no chest pain, headaches, palpitations or shortness of breath. Past treatments include ACE inhibitors and calcium channel blockers. Hypertensive end-organ damage includes kidney disease.     Review of Systems  Constitutional: Negative for fatigue and unexpected weight change.  HENT: Negative for trouble swallowing and voice change.   Eyes: Negative for visual disturbance.  Respiratory: Negative for cough, shortness of breath and wheezing.   Cardiovascular: Negative for chest pain, palpitations and leg swelling.  Gastrointestinal: Negative for diarrhea, nausea and vomiting.  Endocrine: Negative for cold intolerance, heat intolerance, polydipsia, polyphagia and polyuria.  Musculoskeletal: Negative for arthralgias and myalgias.  Skin: Negative for color change, pallor, rash and wound.  Neurological: Negative for seizures  and headaches.  Psychiatric/Behavioral: Negative for confusion and suicidal ideas.    Objective:    BP 117/73   Pulse 98   Ht _0  (1.626 m)   Wt 217 lb (98.4 kg)   BMI 37.25 kg/m   Wt Readings from Last 3 Encounters:  09/25/16 217 lb (98.4 kg)  09/18/16 216 lb (98 kg)  09/18/16 214 lb (97.1 kg)    Physical Exam  Constitutional: She is oriented to person, place, and time. She appears well-developed.  HENT:  Head: Normocephalic and atraumatic.  Eyes: EOM are normal.  Neck: Normal range of motion. Neck supple. No tracheal deviation present. No thyromegaly present.  Cardiovascular: Normal rate and regular rhythm.   Pulmonary/Chest: Effort normal and breath sounds normal.  Abdominal: Soft. Bowel sounds are normal. There is no tenderness. There is no guarding.  Musculoskeletal: Normal range of motion. She exhibits no edema.  Neurological: She is alert and oriented to person, place, and time. She has normal reflexes. No cranial nerve deficit. Coordination normal.  Skin: Skin is warm and dry. No rash noted. No erythema. No pallor.  Psychiatric: She has a normal mood and affect. Judgment normal.    Results for orders placed or performed in visit on 09/18/16  POCT INR  Result Value Ref Range   INR 1.8    Diabetic Labs (most recent): Lab Results  Component Value Date   HGBA1C 8.6 07/10/2016   HGBA1C 6.9 (H) 09/15/2015   HGBA1C 6.7 (H) 05/31/2015    Lipid Panel     Component Value Date/Time   CHOL  04/30/2009 0323    142        ATP III CLASSIFICATION:  <200     mg/dL   Desirable  200-239  mg/dL   Borderline High  >=240    mg/dL   High          TRIG 151 (H) 04/30/2009 0323   HDL 41 04/30/2009 0323   CHOLHDL 3.5 04/30/2009 0323   VLDL 30 04/30/2009 0323   LDLCALC  04/30/2009 0323    71        Total Cholesterol/HDL:CHD Risk Coronary Heart Disease Risk Table                     Men   Women  1/2 Average Risk   3.4   3.3  Average Risk       5.0   4.4  2 X Average  Risk   9.6   7.1  3 X Average Risk  23.4   11.0        Use the calculated Patient Ratio above and the CHD Risk Table  to determine the patient's CHD Risk.        ATP III CLASSIFICATION (LDL):  <100     mg/dL   Optimal  100-129  mg/dL   Near or Above                    Optimal  130-159  mg/dL   Borderline  160-189  mg/dL   High  >190     mg/dL   Very High     Assessment & Plan:   1. Type 2 diabetes mellitus with  ESRD on HD, CAD, with long-term current use of insulin (HCC)  -Her diabetes is  complicated by chronic kidney disease and patient remains at a high risk for more acute and chronic complications of diabetes which include CAD, CVA, CKD, retinopathy, and neuropathy. These are all discussed in detail with the patient.  - She missed her appointments since February 2017. In the interval, she developed end-stage renal disease currently on dialysis.  - Her A1c has increased to 8.6% from 6.7% last visit.  - She came with Her logs in meter showing improved blood glucose profile, however still above target postprandial.  - I have re-counseled the patient on diet management and weight loss  by adopting a carbohydrate restricted / protein rich  Diet.  - Suggestion is made for patient to avoid simple carbohydrates   from their diet including Cakes , Desserts, Ice Cream,  Soda (  diet and regular) , Sweet Tea , Candies,  Chips, Cookies, Artificial Sweeteners,   and "Sugar-free" Products .  This will help patient to have stable blood glucose profile and potentially avoid unintended  Weight gain.  - Patient is advised to stick to a routine mealtimes to eat 3 meals  a day and avoid unnecessary snacks ( to snack only to correct hypoglycemia).  - The patient has declined a visit with CDE for individualized DM education.  - I have approached patient with the following individualized plan to manage diabetes and patient agrees.  - I advised her to increase Humalog 75/25 to 35 units with  breakfast and 30 units with supper when glucose is above 90, associated with strict monitoring of glucose AC and HS.  -Patient is warned not to take insulin without proper monitoring per orders.  -Patient is encouraged to call clinic for blood glucose levels less than 70 or above 300 mg /dl. - She once again has declined referral to CDE. -Patient is not a candidate for MTF, SGLT2 i due to CKD. - Insulin is the  exclusive safe choice of therapy for her at this time.  - Patient specific target  for A1c; LDL, HDL, Triglycerides, and  Waist Circumference were discussed in detail.  2) BP/HTN: uncontrolled,advised her to Continue current medications including ACEI/ARB. 3) Lipids/HPL:  continue statins. 4)  Weight/Diet: declined CDE consult, exercise, and carbohydrates information provided.   5. Vitamin D deficiency -I advised her to continue with vitamin D 2000 units daily.  6) Chronic Care/Health Maintenance:  -Patient is on ACEI/ARB and Statin medications and encouraged to continue to follow up with Ophthalmology, Podiatrist at least yearly or according to recommendations, and advised to  stay away from smoking. I have recommended yearly flu vaccine and pneumonia vaccination at least every 5 years;  and  sleep for at least 7 hours a day.  I advised patient to maintain close follow up with their PCP for primary care needs.  Patient is asked to bring meter and  blood glucose logs during their next visit.   Follow up plan: Return in about 7 weeks (around 11/13/2016) for follow up with pre-visit labs, meter, and logs.  Glade Lloyd, MD Phone: [phone]  Fax: [phone]   09/25/2016, 3:41 PM

## 2016-09-25 NOTE — Patient Instructions (Signed)

## 2016-10-04 ENCOUNTER — Ambulatory Visit (INDEPENDENT_AMBULATORY_CARE_PROVIDER_SITE_OTHER): Payer: Medicare Other | Admitting: Cardiovascular Disease

## 2016-10-04 ENCOUNTER — Encounter: Payer: Self-pay | Admitting: *Deleted

## 2016-10-04 ENCOUNTER — Encounter: Payer: Self-pay | Admitting: Cardiovascular Disease

## 2016-10-04 VITALS — BP 110/60 | HR 50 | Ht 64.0 in | Wt 218.0 lb

## 2016-10-04 DIAGNOSIS — I4892 Unspecified atrial flutter: Secondary | ICD-10-CM | POA: Diagnosis not present

## 2016-10-04 DIAGNOSIS — I25708 Atherosclerosis of coronary artery bypass graft(s), unspecified, with other forms of angina pectoris: Secondary | ICD-10-CM

## 2016-10-04 DIAGNOSIS — R5383 Other fatigue: Secondary | ICD-10-CM | POA: Diagnosis not present

## 2016-10-04 DIAGNOSIS — I209 Angina pectoris, unspecified: Secondary | ICD-10-CM | POA: Diagnosis not present

## 2016-10-04 DIAGNOSIS — I495 Sick sinus syndrome: Secondary | ICD-10-CM

## 2016-10-04 DIAGNOSIS — R001 Bradycardia, unspecified: Secondary | ICD-10-CM

## 2016-10-04 DIAGNOSIS — I10 Essential (primary) hypertension: Secondary | ICD-10-CM

## 2016-10-04 DIAGNOSIS — I5032 Chronic diastolic (congestive) heart failure: Secondary | ICD-10-CM

## 2016-10-04 DIAGNOSIS — E7800 Pure hypercholesterolemia, unspecified: Secondary | ICD-10-CM | POA: Diagnosis not present

## 2016-10-04 DIAGNOSIS — N186 End stage renal disease: Secondary | ICD-10-CM

## 2016-10-04 DIAGNOSIS — I4891 Unspecified atrial fibrillation: Secondary | ICD-10-CM | POA: Diagnosis not present

## 2016-10-04 MED ORDER — METOPROLOL TARTRATE 25 MG PO TABS: 12.5000 mg | ORAL_TABLET | Freq: Two times a day (BID) | ORAL | 1 refills | Status: DC

## 2016-10-04 NOTE — Progress Notes (Signed)
SUBJECTIVE: The patient presents for premature follow-up. I saw her about 2 weeks ago. She was apparently about to undergo dialysis and was reportedly bradycardic.  She has a history of coronary artery disease and CABG as well as paroxysmal atrial fibrillation, hypertension, and end-stage renal disease and is on dialysis. She also has recurrent atrial arrhythmias for which she has seen EP. She had been on amiodarone which was stopped and is on metoprolol.  She is scheduled to see EP, Dr. Lovena Le, on June 21.  ECG performed in the office today which I ordered an percent interpreted demonstrated atrial fibrillation and flutter, heart rate 68 bpm.  We were able to call the dialysis center and got a copy of a telemetry strip which showed atrial flutter and fibrillation, heart rate 46 bpm.  She denies chest pain, lightheadedness, and dizziness. She has felt fatigued for the past 3 days.  She said her blood pressure drops after dialysis.  She has been taking metoprolol 50 mg twice daily but took her last dose yesterday evening.  Heart rate by auscultation is 52 bpm.   Review of Systems: As per "subjective", otherwise negative.  Allergies  Allergen Reactions  . Oxycodone Other (See Comments)    hallucinations  . Clonazepam Other (See Comments)    Sleepy   . Gemfibrozil Other (See Comments)    Patient is not aware of this allergy but states that she has side effects to a cholesterol medication in the past  . Carbamazepine Other (See Comments)    Reaction to tegretol - loopy  . Macrobid [Nitrofurantoin] Nausea And Vomiting    Current Outpatient Prescriptions  Medication Sig Dispense Refill  . aspirin EC 81 MG EC tablet Take 1 tablet (81 mg total) by mouth daily. 30 tablet 1  . atorvastatin (LIPITOR) 80 MG tablet Take 1 tablet (80 mg total) by mouth daily. 30 tablet 3  . blood glucose meter kit and supplies KIT Dispense based on patient and insurance preference. Use up to four  times daily as directed. (FOR ICD-10 E11.65) 1 each 0  . calcium acetate (PHOSLO) 667 MG capsule Take 2 capsules (1,334 mg total) by mouth 3 (three) times daily with meals. 180 capsule 1  . cetirizine (ZYRTEC) 10 MG tablet Take 10 mg by mouth at bedtime.    . diazepam (VALIUM) 5 MG tablet Take 5 mg by mouth 3 (three) times daily as needed for anxiety.     . ferric citrate (AURYXIA) 1 GM 210 MG(Fe) tablet Take 210 mg by mouth 3 (three) times daily with meals.     Marland Kitchen HYDROcodone-acetaminophen (NORCO/VICODIN) 5-325 MG tablet Take 1 tablet by mouth every 6 (six) hours as needed for moderate pain (Must last 30 days.Do not take and drive a car or use machinery.). 100 tablet 0  . Insulin Lispro Prot & Lispro (HUMALOG MIX 75/25 KWIKPEN) (75-25) 100 UNIT/ML Kwikpen Used 35 units with breakfast and 30 units with supper when pre-meal blood glucose is above 90 mg/dL. 5 pen 2  . metoprolol tartrate (LOPRESSOR) 25 MG tablet Take 1 tablet by mouth 2 (two) times daily.    . nitroGLYCERIN (NITROSTAT) 0.4 MG SL tablet Place 1 tablet (0.4 mg total) under the tongue every 5 (five) minutes as needed for chest pain. Max three pills 45 tablet 0  . polyethylene glycol (MIRALAX / GLYCOLAX) packet Take 17 g by mouth 2 (two) times daily. (Patient taking differently: Take 17 g by mouth daily as needed for  mild constipation. ) 14 each 0  . protein supplement (RESOURCE BENEPROTEIN) POWD Take 6 g by mouth 3 (three) times daily with meals.  0  . rOPINIRole (REQUIP) 0.25 MG tablet Take 1 tablet (0.25 mg total) by mouth at bedtime. 30 tablet 3  . sertraline (ZOLOFT) 100 MG tablet Take 200 mg by mouth daily.    . sevelamer carbonate (RENVELA) 800 MG tablet Take 800 mg by mouth 3 (three) times daily with meals.    . Vitamin D, Ergocalciferol, (DRISDOL) 50000 units CAPS capsule Take 50,000 Units by mouth every 7 (seven) days.    Marland Kitchen warfarin (COUMADIN) 5 MG tablet TAKE AS DIRECTED 90 tablet 3   Current Facility-Administered Medications    Medication Dose Route Frequency Provider Last Rate Last Dose  . diclofenac (FLECTOR) 1.3 % 1 patch  1 patch Transdermal BID Sanjuana Kava, MD        Past Medical History:  Diagnosis Date  . Adenomatous colon polyp 09/2007  . Arthritis   . Atrial fibrillation with rapid ventricular response ([place]) 10/05/2015  . CAD (coronary artery disease)    a. s/p CABG 2005.  . Carotid artery disease ([place])    a. s/p R CEA 2011. Followed by vascular -  duplex 04/16/16: <06% RICA, 26-94% LICA.  Marland Kitchen Chronic anemia   . Chronic diastolic CHF (congestive heart failure) ([place])   . Depression with anxiety   . Diabetes mellitus   . ESRD (end stage renal disease) ([place])    T Th Sat  . Heart murmur   . Hyperlipidemia   . Hypertension   . Neuropathy   . PAD (peripheral artery disease) ([place])   . Paroxysmal atrial flutter ([place])   . Peripheral vascular disease ([place])   . Pulmonary hypertension ([place])   . Rheumatic fever   . Sleep apnea     Past Surgical History:  Procedure Laterality Date  . BASCILIC VEIN TRANSPOSITION Left 12/04/2015   Procedure: BASILIC VEIN TRANSPOSITION;  Surgeon: Rosetta Posner, MD;  Location: [facility];  Service: Vascular;  Laterality: Left;  . CAROTID ENDARTERECTOMY  10/04/2009   Right CEA  . CORONARY ARTERY BYPASS GRAFT  2005  . PERIPHERAL VASCULAR CATHETERIZATION N/A 06/14/2016   Procedure: A/V Fistulagram - Left Upper;  Surgeon: Elam Dutch, MD;  Location: [facility];  Service: Cardiovascular;  Laterality: N/A;  . POLYPECTOMY  02/25/2012   Procedure: POLYPECTOMY;  Surgeon: Danie Binder, MD;  Location: [facility];  Service: Endoscopy;  Laterality: N/A;  . TUBAL LIGATION      Social History   Social History  . Marital status: Married    Spouse name: N/A  . Number of children: N/A  . Years of education: N/A   Occupational History  . Not on file.   Social History Main Topics  . Smoking status: Never Smoker  . Smokeless tobacco: Never Used  . Alcohol use No  . Drug use:  No  . Sexual activity: Not on file   Other Topics Concern  . Not on file   Social History Narrative  . No narrative on file     Vitals:   10/04/16 1321  BP: 110/60  Pulse: (!) 50  SpO2: 95%  Weight: 218 lb (98.9 kg)  Height: _0  (1.626 m)    Wt Readings from Last 3 Encounters:  10/04/16 218 lb (98.9 kg)  09/25/16 217 lb (98.4 kg)  09/18/16 216 lb (98 kg)     PHYSICAL EXAM General: NAD  HEENT: Normal. Neck: No JVD, no thyromegaly. Lungs: Clear to auscultation bilaterally with normal respiratory effort. CV: Nondisplaced PMI.  Bradycardic, irregular rhythm, normal S1/S2, no S3, no murmur. No pretibial or periankle edema.   Abdomen: Soft, nontender, no distention.  Neurologic: Alert and oriented.  Psych: Normal affect. Skin: Large ecchymoses on medial aspect of left arm Musculoskeletal: No gross deformities.    ECG: Most recent ECG reviewed.   Labs: Lab Results  Component Value Date/Time   K 4.2 06/14/2016 11:06 AM   BUN 65 (H) 06/14/2016 11:06 AM   CREATININE 5.40 (H) 06/14/2016 11:06 AM   CREATININE 3.50 (H) 04/25/2016 04:14 PM   ALT 14 11/26/2015 04:35 AM   TSH 3.33 04/25/2016 04:14 PM   HGB 12.9 06/14/2016 11:06 AM     Lipids: Lab Results  Component Value Date/Time   LDLCALC  04/30/2009 03:23 AM    71        Total Cholesterol/HDL:CHD Risk Coronary Heart Disease Risk Table                     Men   Women  1/2 Average Risk   3.4   3.3  Average Risk       5.0   4.4  2 X Average Risk   9.6   7.1  3 X Average Risk  23.4   11.0        Use the calculated Patient Ratio above and the CHD Risk Table to determine the patient's CHD Risk.        ATP III CLASSIFICATION (LDL):  <100     mg/dL   Optimal  766-415  mg/dL   Near or Above                    Optimal  130-159  mg/dL   Borderline  492-443  mg/dL   High  >111     mg/dL   Very High   CHOL  23/74/4841 03:23 AM    142        ATP III CLASSIFICATION:  <200     mg/dL   Desirable  670-589  mg/dL    Borderline High  >=104    mg/dL   High          TRIG 740 (H) 04/30/2009 03:23 AM   HDL 41 04/30/2009 03:23 AM       ASSESSMENT AND PLAN:  1. CAD with h/o 3-v CABG in 04/2004: Symptomatically stable. Continue aspirin, Lipitor, and metoprolol (reduce to 12.5 mg bid). Stress testing in May 2017 showed mild peri-infarct ischemia in the anterior and anteroseptal walls.  2. Essential HTN: Controlled. I am reducing metoprolol to 12.5 mg bid due to bradycardia and fatigue.  3. Hyperlipidemia: Continue high-dose Lipitor.  4. Left internal carotid artery stenosis and s/p right CEA: Carotid Dopplers on 04/17/16 showed less than 40% right and 40-59% left internal carotid artery stenosis.  5. Chronic diastolic heart failure: Euvolemic. Volume managed by dialysis.  6. PAF/tachycardia/atrial flutter: Now follows with EP. Currently on metoprolol and warfarin.  I am reducing metoprolol to 12.5 mg bid due to bradycardia and fatigue.  7. Bradycardia and fatigue:  I am reducing metoprolol to 12.5 mg bid due to bradycardia and fatigue. I am trying to arrange for her to see Dr. Ladona Ridgel sooner than June 21. I will obtain a Holter monitor next week to see what her average heart rate is.   Disposition: Follow up in 02/2017 with me.  FU with Dr. Lovena Le in near future.  Time spent: 40 minutes, of which greater than 50% was spent reviewing symptoms, relevant blood tests and studies, and discussing management plan with the patient.   Kate Sable, M.D., F.A.C.C.

## 2016-10-04 NOTE — Patient Instructions (Addendum)
Your physician wants you to follow-up in: October WITH DR Virgina Jock will receive a reminder letter in the mail two months in advance. If you don't receive a letter, please call our office to schedule the follow-up appointment.  Your physician has recommended you make the following change in your medication:   DECREASE METOPROLOL 12.5 MG TWICE DAILY   Your physician has recommended that you wear a holter monitor FOR 24 HOURS WE WILL SCHEDULE YOU FOR A NURSE VISIT NEXT WEEK . Holter monitors are medical devices that record the heart's electrical activity. Doctors most often use these monitors to diagnose arrhythmias. Arrhythmias are problems with the speed or rhythm of the heartbeat. The monitor is a small, portable device. You can wear one while you do your normal daily activities. This is usually used to diagnose what is causing palpitations/syncope (passing out).  Thank you for choosing [facility]!!

## 2016-10-08 ENCOUNTER — Encounter: Payer: Self-pay | Admitting: Vascular Surgery

## 2016-10-09 ENCOUNTER — Emergency Department (HOSPITAL_COMMUNITY): Payer: Medicare Other

## 2016-10-09 ENCOUNTER — Other Ambulatory Visit: Payer: Self-pay | Admitting: *Deleted

## 2016-10-09 ENCOUNTER — Inpatient Hospital Stay (HOSPITAL_COMMUNITY)
Admission: EM | Admit: 2016-10-09 | Discharge: 2016-10-12 | DRG: 308 | Disposition: A | Discharge status: Home Health | Payer: Medicare Other | Attending: Internal Medicine | Admitting: Internal Medicine

## 2016-10-09 ENCOUNTER — Encounter (HOSPITAL_COMMUNITY): Payer: Self-pay | Admitting: *Deleted

## 2016-10-09 DIAGNOSIS — I2510 Atherosclerotic heart disease of native coronary artery without angina pectoris: Secondary | ICD-10-CM | POA: Diagnosis not present

## 2016-10-09 DIAGNOSIS — I82402 Acute embolism and thrombosis of unspecified deep veins of left lower extremity: Secondary | ICD-10-CM

## 2016-10-09 DIAGNOSIS — G4730 Sleep apnea, unspecified: Secondary | ICD-10-CM | POA: Diagnosis present

## 2016-10-09 DIAGNOSIS — Z885 Allergy status to narcotic agent status: Secondary | ICD-10-CM

## 2016-10-09 DIAGNOSIS — Z833 Family history of diabetes mellitus: Secondary | ICD-10-CM

## 2016-10-09 DIAGNOSIS — Z794 Long term (current) use of insulin: Secondary | ICD-10-CM

## 2016-10-09 DIAGNOSIS — Z881 Allergy status to other antibiotic agents status: Secondary | ICD-10-CM

## 2016-10-09 DIAGNOSIS — D631 Anemia in chronic kidney disease: Secondary | ICD-10-CM | POA: Diagnosis present

## 2016-10-09 DIAGNOSIS — I5032 Chronic diastolic (congestive) heart failure: Secondary | ICD-10-CM

## 2016-10-09 DIAGNOSIS — I483 Typical atrial flutter: Secondary | ICD-10-CM | POA: Diagnosis present

## 2016-10-09 DIAGNOSIS — I480 Paroxysmal atrial fibrillation: Secondary | ICD-10-CM | POA: Diagnosis not present

## 2016-10-09 DIAGNOSIS — Z8249 Family history of ischemic heart disease and other diseases of the circulatory system: Secondary | ICD-10-CM

## 2016-10-09 DIAGNOSIS — E1122 Type 2 diabetes mellitus with diabetic chronic kidney disease: Secondary | ICD-10-CM

## 2016-10-09 DIAGNOSIS — Z992 Dependence on renal dialysis: Secondary | ICD-10-CM | POA: Diagnosis not present

## 2016-10-09 DIAGNOSIS — I132 Hypertensive heart and chronic kidney disease with heart failure and with stage 5 chronic kidney disease, or end stage renal disease: Secondary | ICD-10-CM | POA: Diagnosis present

## 2016-10-09 DIAGNOSIS — N186 End stage renal disease: Secondary | ICD-10-CM | POA: Diagnosis not present

## 2016-10-09 DIAGNOSIS — Z79899 Other long term (current) drug therapy: Secondary | ICD-10-CM

## 2016-10-09 DIAGNOSIS — F419 Anxiety disorder, unspecified: Secondary | ICD-10-CM | POA: Diagnosis present

## 2016-10-09 DIAGNOSIS — I272 Pulmonary hypertension, unspecified: Secondary | ICD-10-CM | POA: Diagnosis present

## 2016-10-09 DIAGNOSIS — R001 Bradycardia, unspecified: Secondary | ICD-10-CM | POA: Diagnosis not present

## 2016-10-09 DIAGNOSIS — F329 Major depressive disorder, single episode, unspecified: Secondary | ICD-10-CM | POA: Diagnosis present

## 2016-10-09 DIAGNOSIS — E8889 Other specified metabolic disorders: Secondary | ICD-10-CM | POA: Diagnosis present

## 2016-10-09 DIAGNOSIS — Z7901 Long term (current) use of anticoagulants: Secondary | ICD-10-CM

## 2016-10-09 DIAGNOSIS — Z9109 Other allergy status, other than to drugs and biological substances: Secondary | ICD-10-CM

## 2016-10-09 DIAGNOSIS — Z951 Presence of aortocoronary bypass graft: Secondary | ICD-10-CM

## 2016-10-09 DIAGNOSIS — I482 Chronic atrial fibrillation: Secondary | ICD-10-CM | POA: Diagnosis present

## 2016-10-09 DIAGNOSIS — Z888 Allergy status to other drugs, medicaments and biological substances status: Secondary | ICD-10-CM

## 2016-10-09 DIAGNOSIS — I10 Essential (primary) hypertension: Secondary | ICD-10-CM | POA: Diagnosis not present

## 2016-10-09 DIAGNOSIS — R4589 Other symptoms and signs involving emotional state: Secondary | ICD-10-CM | POA: Diagnosis present

## 2016-10-09 DIAGNOSIS — E782 Mixed hyperlipidemia: Secondary | ICD-10-CM | POA: Diagnosis present

## 2016-10-09 DIAGNOSIS — Z7982 Long term (current) use of aspirin: Secondary | ICD-10-CM

## 2016-10-09 DIAGNOSIS — E1151 Type 2 diabetes mellitus with diabetic peripheral angiopathy without gangrene: Secondary | ICD-10-CM | POA: Diagnosis present

## 2016-10-09 LAB — TSH: TSH: 2.454 u[IU]/mL (ref 0.350–4.500)

## 2016-10-09 LAB — CBC
HCT: 34.3 % — ABNORMAL LOW (ref 36.0–46.0)
Hemoglobin: 10.6 g/dL — ABNORMAL LOW (ref 12.0–15.0)
MCH: 29.9 pg (ref 26.0–34.0)
MCHC: 30.9 g/dL (ref 30.0–36.0)
MCV: 96.6 fL (ref 78.0–100.0)
Platelets: 162 10*3/uL (ref 150–400)
RBC: 3.55 MIL/uL — ABNORMAL LOW (ref 3.87–5.11)
RDW: 15.2 % (ref 11.5–15.5)
WBC: 8.4 10*3/uL (ref 4.0–10.5)

## 2016-10-09 LAB — BASIC METABOLIC PANEL
ANION GAP: 15 (ref 5–15)
BUN: 44 mg/dL — ABNORMAL HIGH (ref 6–20)
CALCIUM: 9 mg/dL (ref 8.9–10.3)
CHLORIDE: 99 mmol/L — AB (ref 101–111)
CO2: 24 mmol/L (ref 22–32)
Creatinine, Ser: 5.76 mg/dL — ABNORMAL HIGH (ref 0.44–1.00)
GFR calc Af Amer: 8 mL/min — ABNORMAL LOW (ref >60)
GFR calc non Af Amer: 7 mL/min — ABNORMAL LOW (ref >60)
GLUCOSE: 121 mg/dL — AB (ref 65–99)
Potassium: 4.5 mmol/L (ref 3.5–5.1)
Sodium: 138 mmol/L (ref 135–145)

## 2016-10-09 LAB — MAGNESIUM: MAGNESIUM: 2.7 mg/dL — AB (ref 1.7–2.4)

## 2016-10-09 LAB — I-STAT TROPONIN, ED: Troponin i, poc: 0.03 ng/mL (ref 0.00–0.08)

## 2016-10-09 LAB — CBG MONITORING, ED: GLUCOSE-CAPILLARY: 222 mg/dL — AB (ref 65–99)

## 2016-10-09 MED ORDER — DICLOFENAC EPOLAMINE 1.3 % TD PTCH
1.0000 | MEDICATED_PATCH | Freq: Two times a day (BID) | TRANSDERMAL | Status: DC
  Administered 2016-10-10 – 2016-10-12 (×6): 1 via TRANSDERMAL
  Filled 2016-10-09 (×7): qty 1

## 2016-10-09 MED ORDER — ASPIRIN EC 81 MG PO TBEC
81.0000 mg | DELAYED_RELEASE_TABLET | Freq: Every day | ORAL | Status: DC
  Administered 2016-10-10 – 2016-10-11 (×2): 81 mg via ORAL
  Filled 2016-10-09 (×2): qty 1

## 2016-10-09 MED ORDER — INSULIN ASPART PROT & ASPART (70-30 MIX) 100 UNIT/ML ~~LOC~~ SUSP
30.0000 [IU] | Freq: Once | SUBCUTANEOUS | Status: DC
  Filled 2016-10-09: qty 10

## 2016-10-09 MED ORDER — INSULIN LISPRO PROT & LISPRO (75-25 MIX) 100 UNIT/ML KWIKPEN: 20.0000 [IU] | PEN_INJECTOR | Freq: Two times a day (BID) | SUBCUTANEOUS | Status: DC

## 2016-10-09 MED ORDER — ACETAMINOPHEN 650 MG RE SUPP: 650.0000 mg | Freq: Four times a day (QID) | RECTAL | Status: DC | PRN

## 2016-10-09 MED ORDER — FERRIC CITRATE 1 GM 210 MG(FE) PO TABS
210.0000 mg | ORAL_TABLET | Freq: Three times a day (TID) | ORAL | Status: DC
  Filled 2016-10-09 (×2): qty 1

## 2016-10-09 MED ORDER — HYDROCODONE-ACETAMINOPHEN 5-325 MG PO TABS
1.0000 | ORAL_TABLET | Freq: Four times a day (QID) | ORAL | Status: DC | PRN
  Administered 2016-10-10 – 2016-10-11 (×3): 1 via ORAL
  Filled 2016-10-09 (×2): qty 1

## 2016-10-09 MED ORDER — ZOLPIDEM TARTRATE 5 MG PO TABS: 5.0000 mg | ORAL_TABLET | Freq: Every evening | ORAL | Status: DC | PRN

## 2016-10-09 MED ORDER — ROPINIROLE HCL 0.5 MG PO TABS
0.2500 mg | ORAL_TABLET | Freq: Every day | ORAL | Status: DC
  Administered 2016-10-10 – 2016-10-11 (×3): 0.25 mg via ORAL
  Filled 2016-10-09 (×3): qty 1

## 2016-10-09 MED ORDER — SEVELAMER CARBONATE 800 MG PO TABS
800.0000 mg | ORAL_TABLET | Freq: Three times a day (TID) | ORAL | Status: DC
  Filled 2016-10-09: qty 1

## 2016-10-09 MED ORDER — AMLODIPINE BESYLATE 10 MG PO TABS
10.0000 mg | ORAL_TABLET | Freq: Every day | ORAL | Status: DC
  Administered 2016-10-10: 10 mg via ORAL
  Filled 2016-10-09: qty 1
  Filled 2016-10-09: qty 2

## 2016-10-09 MED ORDER — ATORVASTATIN CALCIUM 80 MG PO TABS
80.0000 mg | ORAL_TABLET | ORAL | Status: DC
  Administered 2016-10-10: 80 mg via ORAL
  Filled 2016-10-09: qty 1

## 2016-10-09 MED ORDER — ACETAMINOPHEN 325 MG PO TABS
650.0000 mg | ORAL_TABLET | Freq: Four times a day (QID) | ORAL | Status: DC | PRN
  Administered 2016-10-10: 650 mg via ORAL
  Filled 2016-10-09: qty 2

## 2016-10-09 MED ORDER — CALCIUM ACETATE (PHOS BINDER) 667 MG PO CAPS
667.0000 mg | ORAL_CAPSULE | Freq: Three times a day (TID) | ORAL | Status: DC
  Filled 2016-10-09: qty 1

## 2016-10-09 MED ORDER — SERTRALINE HCL 100 MG PO TABS
200.0000 mg | ORAL_TABLET | Freq: Every day | ORAL | Status: DC
  Administered 2016-10-10 – 2016-10-11 (×2): 200 mg via ORAL
  Filled 2016-10-09 (×2): qty 2

## 2016-10-09 MED ORDER — INSULIN ASPART 100 UNIT/ML ~~LOC~~ SOLN
0.0000 [IU] | Freq: Three times a day (TID) | SUBCUTANEOUS | Status: DC
  Administered 2016-10-10 (×2): 2 [IU] via SUBCUTANEOUS
  Administered 2016-10-11 (×3): 3 [IU] via SUBCUTANEOUS
  Administered 2016-10-12: 2 [IU] via SUBCUTANEOUS

## 2016-10-09 MED ORDER — POLYETHYLENE GLYCOL 3350 17 G PO PACK: 17.0000 g | PACK | Freq: Every day | ORAL | Status: DC | PRN

## 2016-10-09 MED ORDER — ONDANSETRON HCL 4 MG/2ML IJ SOLN: 4.0000 mg | Freq: Four times a day (QID) | INTRAMUSCULAR | Status: DC | PRN

## 2016-10-09 MED ORDER — ONDANSETRON HCL 4 MG PO TABS: 4.0000 mg | ORAL_TABLET | Freq: Four times a day (QID) | ORAL | Status: DC | PRN

## 2016-10-09 MED ORDER — DIAZEPAM 5 MG PO TABS
5.0000 mg | ORAL_TABLET | Freq: Three times a day (TID) | ORAL | Status: DC | PRN
  Administered 2016-10-10: 5 mg via ORAL
  Filled 2016-10-09: qty 1

## 2016-10-09 MED ORDER — LORATADINE 10 MG PO TABS
10.0000 mg | ORAL_TABLET | Freq: Every day | ORAL | Status: DC
  Administered 2016-10-10 – 2016-10-11 (×2): 10 mg via ORAL
  Filled 2016-10-09 (×2): qty 1

## 2016-10-09 MED ORDER — NITROGLYCERIN 0.4 MG SL SUBL: 0.4000 mg | SUBLINGUAL_TABLET | SUBLINGUAL | Status: DC | PRN

## 2016-10-09 NOTE — ED Triage Notes (Signed)
Pt went for pre-op appt for vascular procedure. Sent here due to abnormal ekg and HR in the 30's. Pt seen by cardiology on Friday for same but HR was then in 40's and was going to have heart monitor placed but sent here today due to HR in the 30's. Pt has no complaints, denies CP or SOB.

## 2016-10-09 NOTE — H&P (Signed)
History and Physical    Briana [name] PJA:[id number] DOB: Sep 02, 1945 DOA: 10/09/2016  Referring MD/NP/PA:   PCP: Antionette Fairy, PA-C   Patient coming from:  The patient is coming from home.  At baseline, pt is independent for most of ADL.   Chief Complaint: Bradycardia  HPI: Briana [name] is a 71 y.o. female with medical history significant of atrial fibrillation on Coumadin, hypertension, hyperlipidemia, diabetes mellitus, pulmonary hypertension, rheumatic fever, PVD, dCHF, carotid artery stenosis, CAD, CABG, who presents with bradycardia.  Patient states that her heart rate has been slow recently. She was seen by her cardiologist, Dr.KIoneswavan on Friday, who cut her metoprolol dose from 50 to 12.5 mg twice a day. Dr.KIoneswavan was planning to put pt on cardiac monitor, but not done yet. Today, pt went for appointment for AV fistula revision procedure. She was found to have heart rate of 30 and was therefore sent to ED for further eval and treatment. Patient states that she has mild generalized weakness, but no dizziness, chest pain or SOB. Patient denies nausea, vomiting, diarrhea or abdominal pain. No symptoms of UTI or unilateral weakness.  ED Course: pt was found to have negative troponin, WBC 8.4, potassium 4.8, bicarbonate 24, BUN 44, creatinine 5.76, temperature normal, heart rate is 33 to 60s in ED. negative chest x-ray. Patient is placed on stepdown bed for observation. Cardiology, Dr. Lacretia Leigh was consulted on the phone by EDP.  Review of Systems:   General: no fevers, chills, no changes in body weight, has poor appetite, has fatigue HEENT: no blurry vision, hearing changes or sore throat Respiratory: no dyspnea, coughing, wheezing CV: no chest pain, no palpitations GI: no nausea, vomiting, abdominal pain, diarrhea, constipation GU: no dysuria, burning on urination, increased urinary frequency, hematuria  Ext: no leg edema Neuro: no unilateral weakness,  numbness, or tingling, no vision change or hearing loss Skin: no rash, no skin tear. Has bruise over left arm. MSK: No muscle spasm, no deformity, no limitation of range of movement in spin Heme: No easy bruising.  Travel history: No recent long distant travel.  Allergy:  Allergies  Allergen Reactions  . Oxycodone Other (See Comments)    Hallucinations   . Clonazepam Other (See Comments)    Sleepy   . Gemfibrozil Other (See Comments)    Patient is not aware of this allergy but states that she had side effects to a cholesterol medication in the past  . Tape Other (See Comments)    TAPE WILL CAUSE BLISTERS/SORES; PLEASE USE COBAN WRAP!!  . Carbamazepine Other (See Comments)    Reaction to tegretol - "loopy"  . Macrobid [Nitrofurantoin] Nausea And Vomiting    Past Medical History:  Diagnosis Date  . Adenomatous colon polyp 09/2007  . Arthritis   . Atrial fibrillation with rapid ventricular response ([place]) 10/05/2015  . CAD (coronary artery disease)    a. s/p CABG 2005.  . Carotid artery disease ([place])    a. s/p R CEA 2011. Followed by vascular -  duplex 04/16/16: <34% RICA, 19-37% LICA.  Marland Kitchen Chronic anemia   . Chronic diastolic CHF (congestive heart failure) ([place])   . Depression with anxiety   . Diabetes mellitus   . ESRD (end stage renal disease) ([place])    T Th Sat  . Heart murmur   . Hyperlipidemia   . Hypertension   . Neuropathy   . PAD (peripheral artery disease) ([place])   . Paroxysmal atrial flutter ([place])   . Peripheral vascular  disease ([place])   . Pulmonary hypertension ([place])   . Rheumatic fever   . Sleep apnea     Past Surgical History:  Procedure Laterality Date  . BASCILIC VEIN TRANSPOSITION Left 12/04/2015   Procedure: BASILIC VEIN TRANSPOSITION;  Surgeon: Rosetta Posner, MD;  Location: [facility];  Service: Vascular;  Laterality: Left;  . CAROTID ENDARTERECTOMY  10/04/2009   Right CEA  . CORONARY ARTERY BYPASS GRAFT  2005  . PERIPHERAL VASCULAR CATHETERIZATION N/A  06/14/2016   Procedure: A/V Fistulagram - Left Upper;  Surgeon: Elam Dutch, MD;  Location: [facility];  Service: Cardiovascular;  Laterality: N/A;  . POLYPECTOMY  02/25/2012   Procedure: POLYPECTOMY;  Surgeon: Danie Binder, MD;  Location: [facility];  Service: Endoscopy;  Laterality: N/A;  . TUBAL LIGATION      Social History:  reports that she has never smoked. She has never used smokeless tobacco. She reports that she does not drink alcohol or use drugs.  Family History:  Family History  Problem Relation Age of Onset  . Breast cancer Mother   . Heart disease Father   . Diabetes Father   . Heart disease Brother      Prior to Admission medications   Medication Sig Start Date End Date Taking? Authorizing Provider  amLODipine (NORVASC) 10 MG tablet Take 10 mg by mouth daily.   Yes [provider]  aspirin EC 81 MG EC tablet Take 1 tablet (81 mg total) by mouth daily. 10/18/15  Yes Reyne Dumas, MD  atorvastatin (LIPITOR) 80 MG tablet Take 1 tablet (80 mg total) by mouth daily. Patient taking differently: Take 80 mg by mouth every other day.  12/10/15  Yes Rama, Venetia Maxon, MD  blood glucose meter kit and supplies KIT Dispense based on patient and insurance preference. Use up to four times daily as directed. (FOR ICD-10 E11.65) 09/18/16  Yes Nida, Marella Chimes, MD  calcium acetate (PHOSLO) 667 MG capsule Take 2 capsules (1,334 mg total) by mouth 3 (three) times daily with meals. Patient taking differently: Take 667 mg by mouth 3 (three) times daily with meals.  10/24/15  Yes Love, Ivan Anchors, PA-C  cetirizine (ZYRTEC) 10 MG tablet Take 10 mg by mouth daily.    Yes [provider]  diazepam (VALIUM) 5 MG tablet Take 5 mg by mouth 3 (three) times daily as needed for anxiety.    Yes [provider]  ferric citrate (AURYXIA) 1 GM 210 MG(Fe) tablet Take 210 mg by mouth 3 (three) times daily with meals.    Yes [provider]    HYDROcodone-acetaminophen (NORCO/VICODIN) 5-325 MG tablet Take 1 tablet by mouth every 6 (six) hours as needed for moderate pain (Must last 30 days.Do not take and drive a car or use machinery.). 09/18/16  Yes Sanjuana Kava, MD  Insulin Lispro Prot & Lispro (HUMALOG MIX 75/25 KWIKPEN) (75-25) 100 UNIT/ML Kwikpen Used 35 units with breakfast and 30 units with supper when pre-meal blood glucose is above 90 mg/dL. Patient taking differently: Inject 30 Units into the skin 2 (two) times daily before a meal.  09/25/16  Yes Nida, Marella Chimes, MD  metoprolol tartrate (LOPRESSOR) 25 MG tablet Take 0.5 tablets (12.5 mg total) by mouth 2 (two) times daily. 10/04/16 01/02/17 Yes Herminio Commons, MD  polyethylene glycol (MIRALAX / GLYCOLAX) packet Take 17 g by mouth 2 (two) times daily. Patient taking differently: Take 17 g by mouth daily as needed for mild constipation.  12/10/15  Yes Rama, Venetia Maxon, MD  protein supplement (RESOURCE BENEPROTEIN) POWD Take 6 g by mouth 3 (three) times daily with meals. 10/24/15  Yes Love, Ivan Anchors, PA-C  rOPINIRole (REQUIP) 0.25 MG tablet Take 1 tablet (0.25 mg total) by mouth at bedtime. 12/10/15  Yes Rama, Venetia Maxon, MD  sertraline (ZOLOFT) 100 MG tablet Take 200 mg by mouth daily. 05/07/16  Yes [provider]  nitroGLYCERIN (NITROSTAT) 0.4 MG SL tablet Place 1 tablet (0.4 mg total) under the tongue every 5 (five) minutes as needed for chest pain. Max three pills 10/24/15   Love, Ivan Anchors, PA-C  sevelamer carbonate (RENVELA) 800 MG tablet Take 800 mg by mouth 3 (three) times daily with meals.    [provider]  Vitamin D, Ergocalciferol, (DRISDOL) 50000 units CAPS capsule Take 50,000 Units by mouth every 7 (seven) days.    [provider]  warfarin (COUMADIN) 5 MG tablet TAKE AS DIRECTED Patient taking differently: Take 5 mg by mouth once a day at 6 PM 07/10/16   Satira Sark, MD    Physical Exam: Vitals:   10/09/16 2215 10/09/16  2230 10/09/16 2245 10/09/16 2300  BP: (!) 133/32 (!) 131/93 (!) 115/36 (!) 113/59  Pulse: (!) 34 (!) 34 (!) 35 (!) 35  Resp: 15 19 (!) 21 17  Temp:      TempSrc:      SpO2: 97% 98% 94% 95%   General: Not in acute distress HEENT:       Eyes: PERRL, EOMI, no scleral icterus.       ENT: No discharge from the ears and nose, no pharynx injection, no tonsillar enlargement.        Neck: No JVD, no bruit, no mass felt. Heme: No neck lymph node enlargement. Cardiac: X4/G8, 2/6 systolic murmur, irregularly irregular rhythm, bradycardia. No gallops or rubs. Respiratory: No rales, wheezing, rhonchi or rubs. GI: Soft, nondistended, nontender, no rebound pain, no organomegaly, BS present. GU: No hematuria Ext: No pitting leg edema bilaterally. 2+DP/PT pulse bilaterally. Has bruise over left arm. Has functioning AV fistula in left arm Musculoskeletal: No joint deformities, No joint redness or warmth, no limitation of ROM in spin. Skin: No rashes. Has bruise in left arm Neuro: Alert, oriented X3, cranial nerves II-XII grossly intact, moves all extremities normally. Psych: Patient is not psychotic, no suicidal or hemocidal ideation.  Labs on Admission: I have personally reviewed following labs and imaging studies  CBC:  Recent Labs Lab 10/09/16 2055  WBC 8.4  HGB 10.6*  HCT 34.3*  MCV 96.6  PLT 185   Basic Metabolic Panel:  Recent Labs Lab 10/09/16 1520 10/09/16 1914  NA 138  --   K 4.5  --   CL 99*  --   CO2 24  --   GLUCOSE 121*  --   BUN 44*  --   CREATININE 5.76*  --   CALCIUM 9.0  --   MG  --  2.7*   GFR: Estimated Creatinine Clearance: 10.2 mL/min (A) (by C-G formula based on SCr of 5.76 mg/dL (H)). Liver Function Tests: No results for input(s): AST, ALT, ALKPHOS, BILITOT, PROT, ALBUMIN in the last 168 hours. No results for input(s): LIPASE, AMYLASE in the last 168 hours. No results for input(s): AMMONIA in the last 168 hours. Coagulation Profile: No results for  input(s): INR, PROTIME in the last 168 hours. Cardiac Enzymes: No results for input(s): CKTOTAL, CKMB, CKMBINDEX, TROPONINI in the last 168 hours. BNP (  last 3 results) No results for input(s): PROBNP in the last 8760 hours. HbA1C: No results for input(s): HGBA1C in the last 72 hours. CBG:  Recent Labs Lab 10/09/16 2055  GLUCAP 222*   Lipid Profile: No results for input(s): CHOL, HDL, LDLCALC, TRIG, CHOLHDL, LDLDIRECT in the last 72 hours. Thyroid Function Tests:  Recent Labs  10/09/16 1914  TSH 2.454   Anemia Panel: No results for input(s): VITAMINB12, FOLATE, FERRITIN, TIBC, IRON, RETICCTPCT in the last 72 hours. Urine analysis:    Component Value Date/Time   COLORURINE YELLOW 11/10/2015 1521   APPEARANCEUR CLEAR 11/10/2015 1521   LABSPEC 1.013 11/10/2015 1521   PHURINE 5.5 11/10/2015 1521   GLUCOSEU NEGATIVE 11/10/2015 1521   HGBUR NEGATIVE 11/10/2015 1521   BILIRUBINUR NEGATIVE 11/10/2015 1521   KETONESUR NEGATIVE 11/10/2015 1521   PROTEINUR 100 (A) 11/10/2015 1521   UROBILINOGEN 0.2 03/02/2014 1508   NITRITE NEGATIVE 11/10/2015 1521   LEUKOCYTESUR TRACE (A) 11/10/2015 1521   Sepsis Labs: '@LABRCNTIP' (procalcitonin:4,lacticidven:4) )No results found for this or any previous visit (from the past 240 hour(s)).   Radiological Exams on Admission: Dg Chest 2 View  Result Date: 10/09/2016 CLINICAL DATA:  Abnormal electrocardiogram EXAM: CHEST  2 VIEW COMPARISON:  November 21, 2015 FINDINGS: There is no edema or consolidation. Heart is borderline enlarged with pulmonary vascularity within normal limits. No adenopathy. There is aortic atherosclerosis. There is mitral annulus calcification. Patient is status post coronary artery bypass grafting. No bone lesions. IMPRESSION: No edema or consolidation. Borderline cardiomegaly. Aortic atherosclerosis. Electronically Signed   By: Lowella Grip III M.D.   On: 10/09/2016 16:00     EKG: Independently reviewed.  Not done in ED,  will get one.   Assessment/Plan Principal Problem:   Bradycardia Active Problems:   Depressed mood   Essential hypertension, benign   Mixed hyperlipidemia   CAD in native artery   Hx of CABG 2005   Paroxysmal atrial fibrillation ([facility])   Diabetes mellitus with end-stage renal disease ([place])   ESRD (end stage renal disease) on dialysis ([facility])   Bradycardia: Etiology is not clear. Her metoprolol dose was cut to 12.5 mg twice a day. Patient is asymptomatic except for mild generalized weakness. Cardiology, Dr. Patrina Levering was consulted on phone bed ED physician, who recommended to stop metoprolol, and monitor patient's heart rate. If it no improvement, it will consult EP.  - will place on SUD for obs - cycle CE q6 x3 and repeat EKG in the am - pacer pad at bedside - TSH checked, is 2.454 today - continue ASA  Atrial Fibrillation: CHA2DS2-VASc Score is 6, needs oral anticoagulation. Patient is on Coumadin at home. INR is pending on admission. Heart rate slow now -hold Metoprolol -continue coumadin per pharm.  Depression and anxiety: Stable, no suicidal or homicidal ideations. -Continue home medications: Valium, Zoloft  HTN: -Continue amlodipine  HLD: -Lipitor  CAD in native artery: s/p of CABG. No chest pain -continue aspirin and Lipitor  DM-II: Last A1c 8.6 on 07/10/16, poorly controled. Patient is taking Humalog mixed insulin 75/25 at home -will decrease mixed insulin 75/25 dose from 30 to 20 units twice a day -SSI  ESRD (end stage renal disease) on dialysis [facility]): Patient has been compliant to dialysis. potassium 4.8, bicarbonate 24, BUN 44, creatinine 5.76. -continue Renvela and phoslo -left message to renal box for HD.   DVT ppx: on coumadin Code Status: Full code Family Communication:  Yes, patient's daughter at bed side Disposition Plan:  Anticipate discharge back  to previous home environment Consults called:  Cardiology, Dr. Patrina Levering was consulted on phone bed ED  physician Admission status: SDU/obs  Date of Service 10/09/2016    Ivor Costa [facility] Pager [phone]  If 7PM-7AM, please contact night-coverage [email or web address] Password [facility] 10/09/2016, 11:42 PM

## 2016-10-09 NOTE — ED Provider Notes (Signed)
[facility] DEPT Provider Note   CSN: [id number] Arrival date & time: 10/09/16  1501     History   Chief Complaint Chief Complaint  Patient presents with  . Abnormal ECG    HPI Briana [name] is a 71 y.o. female.  HPI Patient has history of atrial fibrillation however she had not been having problems with that for some time. Last week she began having slow heart rate which was first detected when she had her pre-evaluation for a fistula repair. She was seen by her cardiologist and her metoprolol was decreased to 12.5 mg twice daily. This is a dose she has been taking for the past 5 days. Today she went for a follow-up appointment with vascular for recheck of her fistula. Her heart rate in the office remained in the 30s. In the office she apparently seemed somewhat weak and heart rate did not pick up with trying to stand or activity. She was referred to the emergency department. Patient reports she's felt fairly well over the past week but she has noted that she gets fatigued very easily with any activity. She denies chest pain or dyspnea. Patient has maintained her regular dialysis schedule. She did develop a large bruise from dialysis on the left upper arm. She reports this only mildly tender. Patient reports she has been back on her Coumadin for the past 3 days. Past Medical History:  Diagnosis Date  . Adenomatous colon polyp 09/2007  . Arthritis   . Atrial fibrillation with rapid ventricular response ([place]) 10/05/2015  . CAD (coronary artery disease)    a. s/p CABG 2005.  . Carotid artery disease ([place])    a. s/p R CEA 2011. Followed by vascular -  duplex 04/16/16: <40% RICA, 98-11% LICA.  Marland Kitchen Chronic anemia   . Chronic diastolic CHF (congestive heart failure) ([place])   . Depression with anxiety   . Diabetes mellitus   . ESRD (end stage renal disease) ([place])    T Th Sat  . Heart murmur   . Hyperlipidemia   . Hypertension   . Neuropathy   . PAD (peripheral artery disease) ([place])    . Paroxysmal atrial flutter ([place])   . Peripheral vascular disease ([place])   . Pulmonary hypertension ([place])   . Rheumatic fever   . Sleep apnea     Patient Active Problem List   Diagnosis Date Noted  . Personal history of noncompliance with medical treatment, presenting hazards to health 09/17/2016  . Essential hypertension   . Anemia of chronic kidney failure   . Chronic diastolic heart failure ([place]) 11/10/2015  . Depression   . Hypoalbuminemia due to protein-calorie malnutrition ([place])   . Debility 10/18/2015  . Abnormality of gait   . Long term current use of anticoagulant therapy   . Chronic pain syndrome   . Hx of CABG 2005   . Hx of non-ST elevation myocardial infarction (NSTEMI)   . Paroxysmal atrial fibrillation (HCC)   . 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 ESRD (end stage renal disease) on dialysis ([place])   . Benign essential HTN   . AKI (acute kidney injury) ([place])   . Non-ST elevation myocardial infarction (NSTEMI) ([place]) 10/14/2015  . Pulmonary edema   . Abnormal nuclear stress test   . Acute renal failure with tubular necrosis ([place])   . Hypertensive emergency   . Atrial fibrillation with rapid ventricular response ([place]) 10/05/2015  . CAP (community acquired pneumonia)   . CAD in native  artery   . Pulmonary hypertension ([place])   . Atrial fibrillation with RVR ([place])   . Acute on chronic renal failure ([place])   . Physical deconditioning   . Acute respiratory failure with hypoxia ([place])   . Acute pulmonary edema (HCC)   . Diastolic CHF, acute on chronic (HCC)   . Acute blood loss anemia   . Acute respiratory failure ([place]) 10/01/2015  . Hypoxia 10/01/2015  . Elevated troponin 10/01/2015  . Chest pain   . DVT (deep venous thrombosis) ([place])   . Hypoxemia   . Acute on chronic diastolic CHF (congestive heart failure) ([place]) 05/31/2015  . Essential hypertension, benign 03/28/2015  . Mixed hyperlipidemia 03/28/2015  . Vitamin D deficiency 03/28/2015    . Acute renal failure ([place]) 03/04/2014  . UTI (lower urinary tract infection) 03/02/2014  . Acute encephalopathy 03/02/2014  . Dehydration 03/02/2014  . Generalized weakness 03/02/2014  . Obesity 03/02/2014  . Depressed mood 03/02/2014  . Hx of adenomatous colonic polyps 01/28/2012  . Occlusion and stenosis of carotid artery without mention of cerebral infarction 12/03/2011    Past Surgical History:  Procedure Laterality Date  . BASCILIC VEIN TRANSPOSITION Left 12/04/2015   Procedure: BASILIC VEIN TRANSPOSITION;  Surgeon: Rosetta Posner, MD;  Location: [facility];  Service: Vascular;  Laterality: Left;  . CAROTID ENDARTERECTOMY  10/04/2009   Right CEA  . CORONARY ARTERY BYPASS GRAFT  2005  . PERIPHERAL VASCULAR CATHETERIZATION N/A 06/14/2016   Procedure: A/V Fistulagram - Left Upper;  Surgeon: Elam Dutch, MD;  Location: [facility];  Service: Cardiovascular;  Laterality: N/A;  . POLYPECTOMY  02/25/2012   Procedure: POLYPECTOMY;  Surgeon: Danie Binder, MD;  Location: [facility];  Service: Endoscopy;  Laterality: N/A;  . TUBAL LIGATION      OB History    No data available       Home Medications    Prior to Admission medications   Medication Sig Start Date End Date Taking? Authorizing Provider  amLODipine (NORVASC) 10 MG tablet Take 10 mg by mouth daily.   Yes [provider]  aspirin EC 81 MG EC tablet Take 1 tablet (81 mg total) by mouth daily. 10/18/15  Yes Reyne Dumas, MD  atorvastatin (LIPITOR) 80 MG tablet Take 1 tablet (80 mg total) by mouth daily. Patient taking differently: Take 80 mg by mouth every other day.  12/10/15  Yes Rama, Venetia Maxon, MD  blood glucose meter kit and supplies KIT Dispense based on patient and insurance preference. Use up to four times daily as directed. (FOR ICD-10 E11.65) 09/18/16  Yes Nida, Marella Chimes, MD  calcium acetate (PHOSLO) 667 MG capsule Take 2 capsules (1,334 mg total) by mouth 3 (three) times daily with meals. Patient  taking differently: Take 667 mg by mouth 3 (three) times daily with meals.  10/24/15  Yes Love, Ivan Anchors, PA-C  cetirizine (ZYRTEC) 10 MG tablet Take 10 mg by mouth daily.    Yes [provider]  diazepam (VALIUM) 5 MG tablet Take 5 mg by mouth 3 (three) times daily as needed for anxiety.    Yes [provider]  ferric citrate (AURYXIA) 1 GM 210 MG(Fe) tablet Take 210 mg by mouth 3 (three) times daily with meals.    Yes [provider]  HYDROcodone-acetaminophen (NORCO/VICODIN) 5-325 MG tablet Take 1 tablet by mouth every 6 (six) hours as needed for moderate pain (Must last 30 days.Do not take and drive a car or use machinery.).  09/18/16  Yes Sanjuana Kava, MD  Insulin Lispro Prot & Lispro (HUMALOG MIX 75/25 KWIKPEN) (75-25) 100 UNIT/ML Kwikpen Used 35 units with breakfast and 30 units with supper when pre-meal blood glucose is above 90 mg/dL. Patient taking differently: Inject 30 Units into the skin 2 (two) times daily before a meal.  09/25/16  Yes Nida, Marella Chimes, MD  metoprolol tartrate (LOPRESSOR) 25 MG tablet Take 0.5 tablets (12.5 mg total) by mouth 2 (two) times daily. 10/04/16 01/02/17 Yes Herminio Commons, MD  nitroGLYCERIN (NITROSTAT) 0.4 MG SL tablet Place 1 tablet (0.4 mg total) under the tongue every 5 (five) minutes as needed for chest pain. Max three pills 10/24/15  Yes Love, Ivan Anchors, PA-C  polyethylene glycol (MIRALAX / GLYCOLAX) packet Take 17 g by mouth 2 (two) times daily. Patient taking differently: Take 17 g by mouth daily as needed for mild constipation.  12/10/15  Yes Rama, Venetia Maxon, MD  protein supplement (RESOURCE BENEPROTEIN) POWD Take 6 g by mouth 3 (three) times daily with meals. 10/24/15  Yes Love, Ivan Anchors, PA-C  rOPINIRole (REQUIP) 0.25 MG tablet Take 1 tablet (0.25 mg total) by mouth at bedtime. 12/10/15  Yes Rama, Venetia Maxon, MD  sertraline (ZOLOFT) 100 MG tablet Take 200 mg by mouth daily. 05/07/16  Yes [provider]    warfarin (COUMADIN) 5 MG tablet TAKE AS DIRECTED Patient taking differently: Take 5 mg by mouth once a day at 6 PM 07/10/16  Yes Satira Sark, MD  sevelamer carbonate (RENVELA) 800 MG tablet Take 800 mg by mouth 3 (three) times daily with meals.    [provider]  Vitamin D, Ergocalciferol, (DRISDOL) 50000 units CAPS capsule Take 50,000 Units by mouth every 7 (seven) days.    [provider]    Family History Family History  Problem Relation Age of Onset  . Breast cancer Mother   . Heart disease Father   . Diabetes Father   . Heart disease Brother     Social History Social History  Substance Use Topics  . Smoking status: Never Smoker  . Smokeless tobacco: Never Used  . Alcohol use No     Allergies   Oxycodone; Clonazepam; Gemfibrozil; Tape; Carbamazepine; and Macrobid [nitrofurantoin]   Review of Systems Review of Systems  10 Systems reviewed and are negative for acute change except as noted in the HPI. Physical Exam Updated Vital Signs BP (!) 110/39   Pulse (!) 33   Temp 98.6 F (37 C) (Oral)   Resp (!) 24   SpO2 97%   Physical Exam  Constitutional: She is oriented to person, place, and time.  Patient is alert and nontoxic. No respiratory distress at rest.  HENT:  Head: Normocephalic and atraumatic.  Mouth/Throat: Oropharynx is clear and moist.  Eyes: EOM are normal.  Cardiovascular:  Bradycardia, irregularly irregular.  Pulmonary/Chest: Effort normal and breath sounds normal.  Abdominal: Soft. She exhibits no distension. There is no tenderness. There is no guarding.  Musculoskeletal: Normal range of motion. She exhibits no edema or tenderness.  Large bruising of left upper extremity. Deep purple ecchymosis over most of the medial upper arm. Nontender. Fistula has palpable thrill.  Neurological: She is alert and oriented to person, place, and time. No cranial nerve deficit. She exhibits normal muscle tone. Coordination normal.  Skin:  Skin is warm and dry.  Psychiatric: She has a normal mood and affect.     ED Treatments / Results  Labs (all labs ordered are  listed, but only abnormal results are displayed) Labs Reviewed  BASIC METABOLIC PANEL - Abnormal; Notable for the following:       Result Value   Chloride 99 (*)    Glucose, Bld 121 (*)    BUN 44 (*)    Creatinine, Ser 5.76 (*)    GFR calc non Af Amer 7 (*)    GFR calc Af Amer 8 (*)    All other components within normal limits  CBC - Abnormal; Notable for the following:    RBC 3.55 (*)    Hemoglobin 10.6 (*)    HCT 34.3 (*)    All other components within normal limits  MAGNESIUM - Abnormal; Notable for the following:    Magnesium 2.7 (*)    All other components within normal limits  CBG MONITORING, ED - Abnormal; Notable for the following:    Glucose-Capillary 222 (*)    All other components within normal limits  TSH  I-STAT TROPOININ, ED    EKG  EKG Interpretation  Date/Time:  Wednesday Oct 09 2016 15:08:11 EDT Ventricular Rate:  46 PR Interval:    QRS Duration: 150 QT Interval:  528 QTC Calculation: 462 R Axis:   114 Text Interpretation:  Atrial fibrillation with slow ventricular response Right bundle branch block , plus right ventricular hypertrophy Abnormal ECG Since prior ECG, patient in atrial fibrillation Confirmed by [facility] MD, ERIN ([id number]) on 10/09/2016 3:32:40 PM       Radiology Dg Chest 2 View  Result Date: 10/09/2016 CLINICAL DATA:  Abnormal electrocardiogram EXAM: CHEST  2 VIEW COMPARISON:  November 21, 2015 FINDINGS: There is no edema or consolidation. Heart is borderline enlarged with pulmonary vascularity within normal limits. No adenopathy. There is aortic atherosclerosis. There is mitral annulus calcification. Patient is status post coronary artery bypass grafting. No bone lesions. IMPRESSION: No edema or consolidation. Borderline cardiomegaly. Aortic atherosclerosis. Electronically Signed   By: Lowella Grip III M.D.    On: 10/09/2016 16:00    Procedures Procedures (including critical care time)  Medications Ordered in ED Medications - No data to display   Initial Impression / Assessment and Plan / ED Course  I have reviewed the triage vital signs and the nursing notes.  Pertinent labs & imaging results that were available during my care of the patient were reviewed by me and considered in my medical decision making (see chart for details).    Consult: (18:55) Dr. Bronson Ing Stop all beta blockers at this time and admitted to medical service to determine if bradycardia resolves. If not will need cardiology consultation and EP consult. Consult: hospitalist Dr. Donna Bernard for admission  Final Clinical Impressions(s) / ED Diagnoses   Final diagnoses:  Bradycardia  ESRD (end stage renal disease) ([place])  Typical atrial flutter [facility])   Patient refers emergency department for bradycardia. She has history of atrial fibrillation. At this time, rate has remained slow. She however has not had syncope or chest pain. Patient is alert appropriate with stable blood pressure. Reviewed with Dr.Koneswaran, her cardiologist. At this time recommendation is to admit to hospitalist and discontinue all beta blocker and continue to monitor. New Prescriptions New Prescriptions   No medications on file     Charlesetta Shanks, MD 10/09/16 2157

## 2016-10-09 NOTE — ED Notes (Signed)
Pharmacy contacted regarding Novolog order and medication  not scanning properly at bedside. Pharmacy informed this RN that they would correct the Novolog order so Novolog could be administered due to Humolog pens not being available for adult dosing. EDP informed.

## 2016-10-10 ENCOUNTER — Ambulatory Visit: Payer: Medicare Other | Admitting: Internal Medicine

## 2016-10-10 DIAGNOSIS — Z888 Allergy status to other drugs, medicaments and biological substances status: Secondary | ICD-10-CM | POA: Diagnosis not present

## 2016-10-10 DIAGNOSIS — E1122 Type 2 diabetes mellitus with diabetic chronic kidney disease: Secondary | ICD-10-CM | POA: Diagnosis present

## 2016-10-10 DIAGNOSIS — I483 Typical atrial flutter: Secondary | ICD-10-CM | POA: Diagnosis present

## 2016-10-10 DIAGNOSIS — R001 Bradycardia, unspecified: Secondary | ICD-10-CM | POA: Diagnosis present

## 2016-10-10 DIAGNOSIS — F419 Anxiety disorder, unspecified: Secondary | ICD-10-CM | POA: Diagnosis present

## 2016-10-10 DIAGNOSIS — Z833 Family history of diabetes mellitus: Secondary | ICD-10-CM | POA: Diagnosis not present

## 2016-10-10 DIAGNOSIS — I272 Pulmonary hypertension, unspecified: Secondary | ICD-10-CM | POA: Diagnosis present

## 2016-10-10 DIAGNOSIS — I132 Hypertensive heart and chronic kidney disease with heart failure and with stage 5 chronic kidney disease, or end stage renal disease: Secondary | ICD-10-CM | POA: Diagnosis present

## 2016-10-10 DIAGNOSIS — D631 Anemia in chronic kidney disease: Secondary | ICD-10-CM | POA: Diagnosis present

## 2016-10-10 DIAGNOSIS — Z881 Allergy status to other antibiotic agents status: Secondary | ICD-10-CM | POA: Diagnosis not present

## 2016-10-10 DIAGNOSIS — I482 Chronic atrial fibrillation: Secondary | ICD-10-CM | POA: Diagnosis present

## 2016-10-10 DIAGNOSIS — I5032 Chronic diastolic (congestive) heart failure: Secondary | ICD-10-CM | POA: Diagnosis present

## 2016-10-10 DIAGNOSIS — F329 Major depressive disorder, single episode, unspecified: Secondary | ICD-10-CM | POA: Diagnosis present

## 2016-10-10 DIAGNOSIS — I2510 Atherosclerotic heart disease of native coronary artery without angina pectoris: Secondary | ICD-10-CM | POA: Diagnosis present

## 2016-10-10 DIAGNOSIS — I480 Paroxysmal atrial fibrillation: Secondary | ICD-10-CM | POA: Diagnosis present

## 2016-10-10 DIAGNOSIS — E782 Mixed hyperlipidemia: Secondary | ICD-10-CM | POA: Diagnosis present

## 2016-10-10 DIAGNOSIS — Z8249 Family history of ischemic heart disease and other diseases of the circulatory system: Secondary | ICD-10-CM | POA: Diagnosis not present

## 2016-10-10 DIAGNOSIS — N186 End stage renal disease: Secondary | ICD-10-CM | POA: Diagnosis present

## 2016-10-10 DIAGNOSIS — E1151 Type 2 diabetes mellitus with diabetic peripheral angiopathy without gangrene: Secondary | ICD-10-CM | POA: Diagnosis present

## 2016-10-10 DIAGNOSIS — Z992 Dependence on renal dialysis: Secondary | ICD-10-CM | POA: Diagnosis not present

## 2016-10-10 DIAGNOSIS — Z951 Presence of aortocoronary bypass graft: Secondary | ICD-10-CM | POA: Diagnosis not present

## 2016-10-10 DIAGNOSIS — Z885 Allergy status to narcotic agent status: Secondary | ICD-10-CM | POA: Diagnosis not present

## 2016-10-10 DIAGNOSIS — G4730 Sleep apnea, unspecified: Secondary | ICD-10-CM | POA: Diagnosis present

## 2016-10-10 DIAGNOSIS — E8889 Other specified metabolic disorders: Secondary | ICD-10-CM | POA: Diagnosis present

## 2016-10-10 DIAGNOSIS — I10 Essential (primary) hypertension: Secondary | ICD-10-CM | POA: Diagnosis not present

## 2016-10-10 LAB — BASIC METABOLIC PANEL WITH GFR
Anion gap: 14 (ref 5–15)
BUN: 57 mg/dL — ABNORMAL HIGH (ref 6–20)
CO2: 25 mmol/L (ref 22–32)
Calcium: 8.5 mg/dL — ABNORMAL LOW (ref 8.9–10.3)
Chloride: 96 mmol/L — ABNORMAL LOW (ref 101–111)
Creatinine, Ser: 6.53 mg/dL — ABNORMAL HIGH (ref 0.44–1.00)
GFR calc Af Amer: 7 mL/min — ABNORMAL LOW
GFR calc non Af Amer: 6 mL/min — ABNORMAL LOW
Glucose, Bld: 189 mg/dL — ABNORMAL HIGH (ref 65–99)
Potassium: 4 mmol/L (ref 3.5–5.1)
Sodium: 135 mmol/L (ref 135–145)

## 2016-10-10 LAB — GLUCOSE, CAPILLARY
GLUCOSE-CAPILLARY: 205 mg/dL — AB (ref 65–99)
GLUCOSE-CAPILLARY: 173 mg/dL — AB (ref 65–99)
Glucose-Capillary: 175 mg/dL — ABNORMAL HIGH (ref 65–99)
Glucose-Capillary: 165 mg/dL — ABNORMAL HIGH (ref 65–99)

## 2016-10-10 LAB — PROTIME-INR
INR: 1.17
Prothrombin Time: 15 s (ref 11.4–15.2)

## 2016-10-10 LAB — CBC
HCT: 35.2 % — ABNORMAL LOW (ref 36.0–46.0)
Hemoglobin: 11.1 g/dL — ABNORMAL LOW (ref 12.0–15.0)
MCH: 30.2 pg (ref 26.0–34.0)
MCHC: 31.5 g/dL (ref 30.0–36.0)
MCV: 95.9 fL (ref 78.0–100.0)
Platelets: 171 K/uL (ref 150–400)
RBC: 3.67 MIL/uL — ABNORMAL LOW (ref 3.87–5.11)
RDW: 15.3 % (ref 11.5–15.5)
WBC: 8.5 K/uL (ref 4.0–10.5)

## 2016-10-10 LAB — TROPONIN I
Troponin I: <0.03 ng/mL
Troponin I: <0.03 ng/mL (ref <0.03)
Troponin I: <0.03 ng/mL

## 2016-10-10 LAB — MRSA PCR SCREENING: MRSA by PCR: NEGATIVE

## 2016-10-10 MED ORDER — WARFARIN - PHARMACIST DOSING INPATIENT: Freq: Every day | Status: DC

## 2016-10-10 MED ORDER — HYDROCODONE-ACETAMINOPHEN 5-325 MG PO TABS
ORAL_TABLET | ORAL | Status: AC
  Administered 2016-10-10: 1 via ORAL
  Filled 2016-10-10: qty 1

## 2016-10-10 MED ORDER — WARFARIN SODIUM 7.5 MG PO TABS
7.5000 mg | ORAL_TABLET | Freq: Once | ORAL | Status: AC
  Administered 2016-10-10: 7.5 mg via ORAL
  Filled 2016-10-10: qty 1

## 2016-10-10 MED ORDER — AMLODIPINE BESYLATE 5 MG PO TABS
5.0000 mg | ORAL_TABLET | Freq: Every day | ORAL | Status: DC
  Administered 2016-10-11: 5 mg via ORAL
  Filled 2016-10-10: qty 1

## 2016-10-10 MED ORDER — SEVELAMER CARBONATE 800 MG PO TABS
1600.0000 mg | ORAL_TABLET | Freq: Three times a day (TID) | ORAL | Status: DC
  Administered 2016-10-11 (×3): 1600 mg via ORAL
  Filled 2016-10-10 (×3): qty 2

## 2016-10-10 MED ORDER — INSULIN ASPART PROT & ASPART (70-30 MIX) 100 UNIT/ML ~~LOC~~ SUSP
20.0000 [IU] | Freq: Two times a day (BID) | SUBCUTANEOUS | Status: DC
  Administered 2016-10-11 – 2016-10-12 (×3): 20 [IU] via SUBCUTANEOUS
  Filled 2016-10-10: qty 10

## 2016-10-10 NOTE — Progress Notes (Signed)
ANTICOAGULATION CONSULT NOTE - Initial Consult  Pharmacy Consult for warfarin Indication: atrial fibrillation  Allergies  Allergen Reactions  . Oxycodone Other (See Comments)    Hallucinations   . Clonazepam Other (See Comments)    Sleepy   . Gemfibrozil Other (See Comments)    Patient is not aware of this allergy but states that she had side effects to a cholesterol medication in the past  . Tape Other (See Comments)    TAPE WILL CAUSE BLISTERS/SORES; PLEASE USE COBAN WRAP!!  . Carbamazepine Other (See Comments)    Reaction to tegretol - "loopy"  . Macrobid [Nitrofurantoin] Nausea And Vomiting    Patient Measurements:    Vital Signs: Temp: 98.6 F (37 C) (05/16 1616) Temp Source: Oral (05/16 1616) BP: 124/63 (05/17 0000) Pulse Rate: 47 (05/17 0000)  Labs:  Recent Labs  10/09/16 1520 10/09/16 2055 10/10/16 0001  HGB  --  10.6*  --   HCT  --  34.3*  --   PLT  --  162  --   LABPROT  --   --  15.0  INR  --   --  1.17  CREATININE 5.76*  --   --   TROPONINI  --   --  <0.03    Estimated Creatinine Clearance: 10.2 mL/min (A) (by C-G formula based on SCr of 5.76 mg/dL (H)).   Medical History: Past Medical History:  Diagnosis Date  . Adenomatous colon polyp 09/2007  . Arthritis   . Atrial fibrillation with rapid ventricular response ([place]) 10/05/2015  . CAD (coronary artery disease)    a. s/p CABG 2005.  . Carotid artery disease ([place])    a. s/p R CEA 2011. Followed by vascular -  duplex 04/16/16: <78% RICA, 58-85% LICA.  Marland Kitchen Chronic anemia   . Chronic diastolic CHF (congestive heart failure) ([place])   . Depression with anxiety   . Diabetes mellitus   . ESRD (end stage renal disease) ([place])    T Th Sat  . Heart murmur   . Hyperlipidemia   . Hypertension   . Neuropathy   . PAD (peripheral artery disease) ([place])   . Paroxysmal atrial flutter ([place])   . Peripheral vascular disease ([place])   . Pulmonary hypertension ([place])   . Rheumatic fever   . Sleep apnea      Medications:  See electronic med rec  Assessment: 76 yof with history of afib on warfarin PTA. Also with hx DVT noted. Pharmacy consulted to dose inpatient. Per EDP note, pt reports large bruise from dialysis on L upper arm last week and states has been back on her warfarin the past 3 days PTA. INR subtherapeutic (1.17) on admit, Hg 10.6, plt wnl. No bleed documented.  PTA warfarin dose: 5mg  daily (last dose 10/08/16)  Goal of Therapy:  INR 2-3 Monitor platelets by anticoagulation protocol: Yes   Plan:  Daily INR Coumadin 7.5mg  po tonight  Sherlon Handing, PharmD, BCPS Clinical pharmacist, pager [phone] 10/10/2016,1:43 AM

## 2016-10-10 NOTE — Consult Note (Signed)
ELECTROPHYSIOLOGY CONSULT NOTE    Patient ID: Briana [name] MRN: [id number], DOB/AGE: [birth year] 71 y.o.  Admit date: 10/09/2016 Date of Consult: 10/10/2016   Primary Physician: [name] Primary Cardiologist: Dr. Bronson Ing Electrophysiologist: Dr. Lovena Le   HPI: Briana [name] is a 71 y.o. female who is being seen today for the evaluation of bradycardia at the request of Dr. Eliseo Squires  PMHx includes: CAD (CABG 2005), PVF w/R CEA 2011), ESRF on HD< DM, PAFib/flutter, HTN, HLD, chronic anemia, chronic CHF (diastolic), severe p.HTN  She was seen by Dr. Bronson Ing 10/04/16 for an add on visit noted at dialysis to have HR 40's, his metoprolol was reduced to 12.61m BID then with plans to have him wear a holter to get an idea of average HR, his HR when he saw him was 50's.  She last saw Dr. TLovena Lein Dec who mentioned she has a  Multitude of atrial arrhythmias and was in AF at thatt ime, her amiodarone was stopped with plans for rate control strategy alone.  [facility] she was being seen at a routine PMD visit when her HR was noted to be 30, she was c/o fatigue but no dizziness, no reports of near syncope or syncope.  The patient says for about a month somewhat more fatigued then usual, but no dizzy spells and denies any near syncope or syncope.  LABS: K+ 4.0 BUN/Creat 57/6.53 Trop I: <0.03 x2 WBC 8.5 H/H 11.1/35.2 Plts 171 TSH 2.454 Mag 2.7 INR 1.17  Home meds include lopressor 12.510mBID, last dose reported at about 9:30 AM yesterday.   Past Medical History:  Diagnosis Date  . Adenomatous colon polyp 09/2007  . Arthritis   . Atrial fibrillation with rapid ventricular response (HCEustis5/03/2016  . CAD (coronary artery disease)    a. s/p CABG 2005.  . Carotid artery disease (HCRuma   a. s/p R CEA 2011. Followed by vascular -  duplex 04/16/16: <4<35%ICA, 4036-14%ICA.  . Marland Kitchenhronic anemia   . Chronic diastolic CHF (congestive heart  failure) (HCHibbing  . Depression with anxiety   . Diabetes mellitus   . ESRD (end stage renal disease) (HCBruce   T Th Sat  . Heart murmur   . Hyperlipidemia   . Hypertension   . Neuropathy   . PAD (peripheral artery disease) (HCLos [place]  . Paroxysmal atrial flutter (HCRoseland  . Peripheral vascular disease (HCClearfield  . Pulmonary hypertension (HCSanford  . Rheumatic fever   . Sleep apnea      Surgical History:  Past Surgical History:  Procedure Laterality Date  . BASCILIC VEIN TRANSPOSITION Left 12/04/2015   Procedure: BASILIC VEIN TRANSPOSITION;  Surgeon: ToRosetta PosnerMD;  [facility] Service: Vascular;  Laterality: Left;  . CAROTID ENDARTERECTOMY  10/04/2009   Right CEA  . CORONARY ARTERY BYPASS GRAFT  2005  . PERIPHERAL VASCULAR CATHETERIZATION N/A 06/14/2016   Procedure: A/V Fistulagram - Left Upper;  Surgeon: ChElam DutchMD;  [facility];  Service: Cardiovascular;  Laterality: N/A;  . POLYPECTOMY  02/25/2012   Procedure: POLYPECTOMY;  Surgeon: SaDanie BinderMD;  Location: [facility];  Service: Endoscopy;  Laterality: N/A;  . TUBAL LIGATION       Facility-Administered Medications Prior to Admission  Medication Dose Route Frequency Provider Last Rate Last Dose  . diclofenac (FLECTOR) 1.3 % 1 patch  1 patch Transdermal BID KeSanjuana Kava  MD       Prescriptions Prior to Admission  Medication Sig Dispense Refill Last Dose  . amLODipine (NORVASC) 10 MG tablet Take 10 mg by mouth daily.   10/08/2016 at pm  . aspirin EC 81 MG EC tablet Take 1 tablet (81 mg total) by mouth daily. 30 tablet 1 10/09/2016 at 0800  . atorvastatin (LIPITOR) 80 MG tablet Take 1 tablet (80 mg total) by mouth daily. (Patient taking differently: Take 80 mg by mouth every other day. ) 30 tablet 3 10/08/2016 at pm  . blood glucose meter kit and supplies KIT Dispense based on patient and insurance preference. Use up to four times daily as directed. (FOR ICD-10 E11.65) 1 each 0 Taking  . calcium acetate  (PHOSLO) 667 MG capsule Take 2 capsules (1,334 mg total) by mouth 3 (three) times daily with meals. (Patient taking differently: Take 667 mg by mouth 3 (three) times daily with meals. ) 180 capsule 1 10/09/2016 at am  . cetirizine (ZYRTEC) 10 MG tablet Take 10 mg by mouth daily.    Past Week at Unknown time  . diazepam (VALIUM) 5 MG tablet Take 5 mg by mouth 3 (three) times daily as needed for anxiety.    10/09/2016 at am  . ferric citrate (AURYXIA) 1 GM 210 MG(Fe) tablet Take 210 mg by mouth 3 (three) times daily with meals.    10/09/2016 at am  . HYDROcodone-acetaminophen (NORCO/VICODIN) 5-325 MG tablet Take 1 tablet by mouth every 6 (six) hours as needed for moderate pain (Must last 30 days.Do not take and drive a car or use machinery.). 100 tablet 0 10/09/2016 at am  . Insulin Lispro Prot & Lispro (HUMALOG MIX 75/25 KWIKPEN) (75-25) 100 UNIT/ML Kwikpen Used 35 units with breakfast and 30 units with supper when pre-meal blood glucose is above 90 mg/dL. (Patient taking differently: Inject 30 Units into the skin 2 (two) times daily before a meal. ) 5 pen 2 10/09/2016 at am  . metoprolol tartrate (LOPRESSOR) 25 MG tablet Take 0.5 tablets (12.5 mg total) by mouth 2 (two) times daily. 90 tablet 1 10/09/2016 at 0900  . nitroGLYCERIN (NITROSTAT) 0.4 MG SL tablet Place 1 tablet (0.4 mg total) under the tongue every 5 (five) minutes as needed for chest pain. Max three pills 45 tablet 0 PRN at PRN  . polyethylene glycol (MIRALAX / GLYCOLAX) packet Take 17 g by mouth 2 (two) times daily. (Patient taking differently: Take 17 g by mouth daily as needed for mild constipation. ) 14 each 0 PRN at PRN  . protein supplement (RESOURCE BENEPROTEIN) POWD Take 6 g by mouth 3 (three) times daily with meals.  0 10/09/2016 at am  . rOPINIRole (REQUIP) 0.25 MG tablet Take 1 tablet (0.25 mg total) by mouth at bedtime. 30 tablet 3 10/08/2016 at pm  . sertraline (ZOLOFT) 100 MG tablet Take 200 mg by mouth daily.   10/08/2016 at 1200  .  warfarin (COUMADIN) 5 MG tablet TAKE AS DIRECTED (Patient taking differently: Take 5 mg by mouth once a day at 6 PM) 90 tablet 3 10/08/2016 at 1800  . sevelamer carbonate (RENVELA) 800 MG tablet Take 800 mg by mouth 3 (three) times daily with meals.   Not Taking at Unknown time  . Vitamin D, Ergocalciferol, (DRISDOL) 50000 units CAPS capsule Take 50,000 Units by mouth every 7 (seven) days.   Not Taking at Unknown time    Inpatient Medications:  . amLODipine  10 mg Oral Daily  . aspirin  EC  81 mg Oral Daily  . atorvastatin  80 mg Oral Q48H  . calcium acetate  667 mg Oral TID WC  . diclofenac  1 patch Transdermal BID  . ferric citrate  210 mg Oral TID WC  . insulin aspart  0-9 Units Subcutaneous TID WC  . insulin aspart protamine- aspart  20 Units Subcutaneous BID WC  . loratadine  10 mg Oral Daily  . rOPINIRole  0.25 mg Oral QHS  . sertraline  200 mg Oral Daily  . sevelamer carbonate  800 mg Oral TID WC  . warfarin  7.5 mg Oral ONCE-1800  . Warfarin - Pharmacist Dosing Inpatient   Does not apply q1800    Allergies:  Allergies  Allergen Reactions  . Oxycodone Other (See Comments)    Hallucinations   . Clonazepam Other (See Comments)    Sleepy   . Gemfibrozil Other (See Comments)    Patient is not aware of this allergy but states that she had side effects to a cholesterol medication in the past  . Tape Other (See Comments)    TAPE Victorine Mcnee CAUSE BLISTERS/SORES; PLEASE USE COBAN WRAP!!  . Carbamazepine Other (See Comments)    Reaction to tegretol - "loopy"  . Macrobid [Nitrofurantoin] Nausea And Vomiting    Social History   Social History  . Marital status: Married    Spouse name: N/A  . Number of children: N/A  . Years of education: N/A   Occupational History  . Not on file.   Social History Main Topics  . Smoking status: Never Smoker  . Smokeless tobacco: Never Used  . Alcohol use No  . Drug use: No  . Sexual activity: Not on file   Other Topics Concern  . Not on  file   Social History Narrative  . No narrative on file     Family History  Problem Relation Age of Onset  . Breast cancer Mother   . Heart disease Father   . Diabetes Father   . Heart disease Brother      Review of Systems: All other systems reviewed and are otherwise negative except as noted above.  Physical Exam: Vitals:   10/10/16 0045 10/10/16 0115 10/10/16 0145 10/10/16 0245  BP: 98/63   (!) 103/32  Pulse: (!) 33 (!) 106 88 65  Resp: '18 17 12 16  ' Temp:    98.1 F (36.7 C)  TempSrc:    Oral  SpO2: 96% 97% 95% 98%  Weight:    217 lb (98.4 kg)  Height:    '5\' 4"'  (1.626 m)    GEN- The patient is well appearing, alert and oriented x 3 today.   HEENT: normocephalic, atraumatic; sclera clear, conjunctiva pink; hearing intact; oropharynx clear; neck supple, no JVP Lymph- no cervical lymphadenopathy Lungs- CTA b/l, normal work of breathing.  No wheezes, rales, rhonchi Heart- IRRR,  no murmurs, rubs or gallops, PMI not laterally displaced GI- obese, soft, non-tender, non-distended Extremities- no clubbing, cyanosis, or edema, LUE large area of ecchymosis MS- no significant deformity or atrophy Skin- warm and dry, no rash or lesion Psych- euthymic mood, full affect Neuro- no gross deficits observed  Labs:   Lab Results  Component Value Date   WBC 8.5 10/10/2016   HGB 11.1 (L) 10/10/2016   HCT 35.2 (L) 10/10/2016   MCV 95.9 10/10/2016   PLT 171 10/10/2016    Recent Labs Lab 10/10/16 0341  NA 135  K 4.0  CL 96*  CO2  25  BUN 57*  CREATININE 6.53*  CALCIUM 8.5*  GLUCOSE 189*      Radiology/Studies:  Dg Chest 2 View Result Date: 10/09/2016 CLINICAL DATA:  Abnormal electrocardiogram EXAM: CHEST  2 VIEW COMPARISON:  November 21, 2015 FINDINGS: There is no edema or consolidation. Heart is borderline enlarged with pulmonary vascularity within normal limits. No adenopathy. There is aortic atherosclerosis. There is mitral annulus calcification. Patient is status post  coronary artery bypass grafting. No bone lesions. IMPRESSION: No edema or consolidation. Borderline cardiomegaly. Aortic atherosclerosis. Electronically Signed   By: Lowella Grip III M.D.   On: 10/09/2016 16:00    Reviewed by myself: EKG: AFlutter 52bpm, RBBB (old) TELEMETRY: AFlutter 30's (appears overnight)- 50's  10/02/15: TEE Study Conclusions - Left ventricle: The cavity size was normal. There was mild focal   basal hypertrophy of the septum. Systolic function was normal.   The estimated ejection fraction was in the range of 55% to 60%.   Wall motion was normal; there were no regional wall motion   abnormalities. There was a reduced contribution of atrial   contraction to ventricular filling, due to increased ventricular   diastolic pressure or atrial contractile dysfunction. Doppler   parameters are consistent with a reversible restrictive pattern,   indicative of decreased left ventricular diastolic compliance   and/or increased left atrial pressure (grade 3 diastolic   dysfunction). Doppler parameters are consistent with high   ventricular filling pressure. - Aortic valve: Severe diffuse thickening and calcification,   consistent with sclerosis. There was mild regurgitation. Valve   area (VTI): 1.93 cm^2. Valve area (Vmax): 1.83 cm^2. Valve area   (Vmean): 1.97 cm^2. - Mitral valve: Severely calcified annulus. Mild diffuse   calcification, with moderate involvement of chords. There was   mild regurgitation. Valve area by continuity equation (using LVOT   flow): 1.87 cm^2. - Left atrium: The atrium was severely dilated. 62m - Tricuspid valve: There was moderate regurgitation. - Pulmonary arteries: PA peak pressure: 69 mm Hg (S). Impressions: - The right ventricular systolic pressure was increased consistent   with severe pulmonary hypertension.   Assessment and Plan:   1. Bradycardia     Not overtly symptomatic, perhaps more fatigued of late     While sitting and  talking with me her HR 50's-60 range     Last dose of lopressor was >24hours ago     HR overnight 30bpm, this AM better     Out patient (prior to BB washout reported 30's)     BP stable  Would like to try and avoid PPM given ESRF and HD, her risk of infection complication is significant.  She is not overtly symptomatic, and would give some more time to be sure BB is completely washed out.  Recommend ambulating patient to evaluate HR response/any symptoms. Pt is reluctant about pacer as well and would like to avoid if possible.   2. CAFib/flutter     INR subtherapeutic    CHA2DSVasc is at least 5 on warfarin     Pharmacy on case    Dr. CCurt Bearsto see  Signed, RTommye Standard PA-C 10/10/2016 10:32 AM  I have seen and examined this patient with RTommye Standard  Agree with above, note added to reflect my findings.  On exam, RRR,no murmurs, lungs clear. Presented to the hospital with episodes of bradycardia. Was on 12.5 mg of metoprolol, which has since been stopped. She does have episodes where her heart rate gets into the 30s, but  she is asymptomatic with this. Her heart rate currently is in the 60s to 70s. She is in atrial flutter at this time. Due to the fact that she is a dialysis patient, and her risk of infection would be extremely high, Therman Hughlett plan to continue further monitoring off of her metoprolol.    Sherina Stammer M. Yazid Pop MD 10/10/2016 1:39 PM

## 2016-10-10 NOTE — Progress Notes (Signed)
Inpatient Diabetes Program Recommendations  AACE/ADA: New Consensus Statement on Inpatient Glycemic Control (2015)  Target Ranges:  Prepandial:   less than 140 mg/dL      Peak postprandial:   less than 180 mg/dL (1-2 hours)      Critically ill patients:  140 - 180 mg/dL   Lab Results  Component Value Date   GLUCAP 165 (H) 10/10/2016   HGBA1C 8.6 07/10/2016    Review of Glycemic Control  Diabetes history: DM2 Outpatient Diabetes medications: 75/25 35 units am + 30 units pm Current orders for Inpatient glycemic control: 70/30 20 units bid  Inpatient Diabetes Program Recommendations:  Spoke with RN Albertina Senegal regarding patient medications and CBGs. Patient has not received am dose of 70/30 due to NPO status. Please consider while NPO and in the hospital : -Change to Lantus 23 units daily (50% daily home basal dose)  Thank you, Bethena Roys E. Marcedes Tech, RN, MSN, CDE  Diabetes Coordinator Inpatient Glycemic Control Team Team Pager [phone] (8am-5pm) 10/10/2016 11:49 AM

## 2016-10-10 NOTE — Progress Notes (Signed)
Patient ambulated 170 ft with cane and with staff this morning. Pt reported no dizziness or shortness of breath. Pt HR was up into the mid 60's dropped to 47 once during walk. Tolerated well. Pt up in chair resting. Will continue to monitor.   Briana [name] E

## 2016-10-10 NOTE — Progress Notes (Signed)
Patient arrived to unit per bed.  Reviewed treatment plan and this RN agrees.  Report received from bedside RN, Aldona Bar.  Consent obtained.  Patient A & O X 4. Lung sounds diminished to ausculation in all fields. No edema. Cardiac: SB, a flutter.  Prepped LUAVF with alcohol and cannulated with two 15 gauge needles.  Pulsation of blood noted.  Flushed access well with saline per protocol.  Connected and secured lines and initiated tx at 1608.  UF goal of 2700 mL and net fluid removal of 2200 mL.  Will continue to monitor.

## 2016-10-10 NOTE — Evaluation (Signed)
Physical Therapy Evaluation Patient Details Name: Briana [name] MRN: [id number] DOB: [birth year] Today's Date: 10/10/2016   History of Present Illness  Pt is a 71 y/o female admitted from home secondary to bradycardia. PMH including but not limited to a-fib with RVR, CHF, DM, ESRD, HLD, HTN, PAD, PVD and hx of CABG in 2005.  Clinical Impression  Pt presented sitting OOB in recliner chair, awake and willing to participate in therapy session. Prior to admission, pt reported that she was mod I for all functional mobility with use of SPC to ambulate and independent with ADLs. Pt stated that she is the primary caregiver for her husband who has dementia. Pt ambulated in hallway with SPC and min guard for safety with mild instability. Pt's HR and SPO2 remained stable throughout. No further acute PT needs identified at this time. PT signing off.     Follow Up Recommendations No PT follow up;Supervision - Intermittent    Equipment Recommendations  None recommended by PT    Recommendations for Other Services       Precautions / Restrictions Precautions Precautions: None Restrictions Weight Bearing Restrictions: No      Mobility  Bed Mobility               General bed mobility comments: pt sitting OOB in recliner chair when therapist entered room  Transfers Overall transfer level: Needs assistance Equipment used: Straight cane Transfers: Sit to/from Stand Sit to Stand: Supervision         General transfer comment: supervision for safety  Ambulation/Gait Ambulation/Gait assistance: Min guard Ambulation Distance (Feet): 150 Feet Assistive device: Straight cane Gait Pattern/deviations: Step-through pattern;Decreased step length - right;Decreased step length - left;Decreased stride length;Drifts right/left;Wide base of support Gait velocity: decreased Gait velocity interpretation: Below normal speed for age/gender General Gait Details: modest instability with ambulation  but no overt LOB or need for physical assistance, min guard for safety  Stairs            Wheelchair Mobility    Modified Rankin (Stroke Patients Only)       Balance Overall balance assessment: Needs assistance Sitting-balance support: Feet supported Sitting balance-Leahy Scale: Good     Standing balance support: During functional activity;No upper extremity supported Standing balance-Leahy Scale: Fair                               Pertinent Vitals/Pain Pain Assessment: No/denies pain    Home Living Family/patient expects to be discharged to:: Private residence Living Arrangements: Spouse/significant other (husband has dementia, pt is primary caregiver) Available Help at Discharge: Family;Available PRN/intermittently Type of Home: House Home Access: Ramped entrance     Home Layout: One level Home Equipment: Cane - single point;Walker - 2 wheels;Bedside commode;Shower seat      Prior Function Level of Independence: Independent with assistive device(s)               Hand Dominance   Dominant Hand: Right    Extremity/Trunk Assessment   Upper Extremity Assessment Upper Extremity Assessment: Overall WFL for tasks assessed    Lower Extremity Assessment Lower Extremity Assessment: Generalized weakness       Communication   Communication: No difficulties  Cognition Arousal/Alertness: Awake/alert Behavior During Therapy: WFL for tasks assessed/performed Overall Cognitive Status: Within Functional Limits for tasks assessed  General Comments      Exercises     Assessment/Plan    PT Assessment Patent does not need any further PT services  PT Problem List         PT Treatment Interventions      PT Goals (Current goals can be found in the Care Plan section)  Acute Rehab PT Goals Patient Stated Goal: return home    Frequency     Barriers to discharge         Co-evaluation               AM-PAC PT "6 Clicks" Daily Activity  Outcome Measure Difficulty turning over in bed (including adjusting bedclothes, sheets and blankets)?: None Difficulty moving from lying on back to sitting on the side of the bed? : None Difficulty sitting down on and standing up from a chair with arms (e.g., wheelchair, bedside commode, etc,.)?: None Help needed moving to and from a bed to chair (including a wheelchair)?: A Little Help needed walking in hospital room?: A Little Help needed climbing 3-5 steps with a railing? : A Little 6 Click Score: 21    End of Session Equipment Utilized During Treatment: Gait belt Activity Tolerance: Patient tolerated treatment well Patient left: in chair;with call bell/phone within reach Nurse Communication: Mobility status PT Visit Diagnosis: Other abnormalities of gait and mobility (R26.89)    Time: 5784-6962 PT Time Calculation (min) (ACUTE ONLY): 15 min   Charges:   PT Evaluation $PT Eval Moderate Complexity: 1 Procedure     PT G Codes:        Sherie Don, PT, DPT [place] 10/10/2016, 4:31 PM

## 2016-10-10 NOTE — Progress Notes (Signed)
Dialysis treatment completed.  2700 mL ultrafiltrated and net fluid removal 2200 mL.    Patient status unchanged. Lung sounds clear to ausculation in all fields. No edema. Cardiac: SB.  Disconnected lines and removed needles.  Pressure held for 10 minutes and band aid/gauze dressing applied.  Report given to bedside RN, Vikki Ports.

## 2016-10-10 NOTE — Consult Note (Signed)
Reason for Consult: To manage dialysis and dialysis related needs Referring Physician: [name] is an 71 y.o. female with past medical history significant for hypertension, neuropathy, hyperlipidemia, arthritis of the back, diabetes mellitus as well as coronary artery disease status post CABG in 2005 and a history of atrial fibrillation. She's also end-stage renal disease. She dialyzes in the [facility] unit. She's been on dialysis approximately a year.  Patient had been having difficulty with her fistula. She went to an access center on Friday and was found have a low heart rate. She was sent to her cardiologist and her beta blocker was decreased. Her cardiologist cleared her for the procedure which she underwent on Monday. She then received dialysis at her outpatient unit on Tuesday. On Wednesday, it appears that she went to her primary care physician and again was found to have a low heart rates was immediately sent to the hospital. She states that she hasn't really felt badly with any of this.  Her beta blocker was held and her heart rate has gone from the 30s up to the 50s. Blood pressure is fine. Again patient is asymptomatic. We are asked to provide her dialysis treatment   Dialyzes at [place] 97 kg . HD Bath 2/2.5, Dialyzer 180, Heparin none. Access left upper arm AV fistula status post intervention on 5/14.  She receives no medications with dialysis. On Tuesday her post treatment weight was 96.6.  Past Medical History:  Diagnosis Date  . Adenomatous colon polyp 09/2007  . Arthritis   . Atrial fibrillation with rapid ventricular response ([place]) 10/05/2015  . CAD (coronary artery disease)    a. s/p CABG 2005.  . Carotid artery disease ([place])    a. s/p R CEA 2011. Followed by vascular -  duplex 04/16/16: <69% RICA, 62-95% LICA.  Marland Kitchen Chronic anemia   . Chronic diastolic CHF (congestive heart failure) ([place])   . Depression with anxiety   . Diabetes  mellitus   . ESRD (end stage renal disease) ([place])    T Th Sat  . Heart murmur   . Hyperlipidemia   . Hypertension   . Neuropathy   . PAD (peripheral artery disease) ([place])   . Paroxysmal atrial flutter ([place])   . Peripheral vascular disease ([place])   . Pulmonary hypertension ([place])   . Rheumatic fever   . Sleep apnea     Past Surgical History:  Procedure Laterality Date  . BASCILIC VEIN TRANSPOSITION Left 12/04/2015   Procedure: BASILIC VEIN TRANSPOSITION;  Surgeon: Rosetta Posner, MD;  Location: [place];  Service: Vascular;  Laterality: Left;  . CAROTID ENDARTERECTOMY  10/04/2009   Right CEA  . CORONARY ARTERY BYPASS GRAFT  2005  . PERIPHERAL VASCULAR CATHETERIZATION N/A 06/14/2016   Procedure: A/V Fistulagram - Left Upper;  Surgeon: Elam Dutch, MD;  Location: [facility];  Service: Cardiovascular;  Laterality: N/A;  . POLYPECTOMY  02/25/2012   Procedure: POLYPECTOMY;  Surgeon: Danie Binder, MD;  Location: [facility];  Service: Endoscopy;  Laterality: N/A;  . TUBAL LIGATION      Family History  Problem Relation Age of Onset  . Breast cancer Mother   . Heart disease Father   . Diabetes Father   . Heart disease Brother     Social History:  reports that she has never smoked. She has never used smokeless tobacco. She reports that she does not drink alcohol or use drugs.  Allergies:  Allergies  Allergen Reactions  . Oxycodone Other (See Comments)    Hallucinations   . Clonazepam Other (See Comments)    Sleepy   . Gemfibrozil Other (See Comments)    Patient is not aware of this allergy but states that she had side effects to a cholesterol medication in the past  . Tape Other (See Comments)    TAPE WILL CAUSE BLISTERS/SORES; PLEASE USE COBAN WRAP!!  . Carbamazepine Other (See Comments)    Reaction to tegretol - "loopy"  . Macrobid [Nitrofurantoin] Nausea And Vomiting    Medications: I have reviewed the patient's current medications.   Results for orders placed or  performed during the hospital encounter of 10/09/16 (from the past 48 hour(s))  Basic metabolic panel     Status: Abnormal   Collection Time: 10/09/16  3:20 PM  Result Value Ref Range   Sodium 138 135 - 145 mmol/L   Potassium 4.5 3.5 - 5.1 mmol/L   Chloride 99 (L) 101 - 111 mmol/L   CO2 24 22 - 32 mmol/L   Glucose, Bld 121 (H) 65 - 99 mg/dL   BUN 44 (H) 6 - 20 mg/dL   Creatinine, Ser 5.76 (H) 0.44 - 1.00 mg/dL   Calcium 9.0 8.9 - 10.3 mg/dL   GFR calc non Af Amer 7 (L) >60 mL/min   GFR calc Af Amer 8 (L) >60 mL/min    Comment: (NOTE) The eGFR has been calculated using the CKD EPI equation. This calculation has not been validated in all clinical situations. eGFR's persistently <60 mL/min signify possible Chronic Kidney Disease.    Anion gap 15 5 - 15  I-stat troponin, ED     Status: None   Collection Time: 10/09/16  3:25 PM  Result Value Ref Range   Troponin i, poc 0.03 0.00 - 0.08 ng/mL   Comment 3            Comment: Due to the release kinetics of cTnI, a negative result within the first hours of the onset of symptoms does not rule out myocardial infarction with certainty. If myocardial infarction is still suspected, repeat the test at appropriate intervals.   TSH     Status: None   Collection Time: 10/09/16  7:14 PM  Result Value Ref Range   TSH 2.454 0.350 - 4.500 uIU/mL    Comment: Performed by a 3rd Generation assay with a functional sensitivity of <=0.01 uIU/mL.  Magnesium     Status: Abnormal   Collection Time: 10/09/16  7:14 PM  Result Value Ref Range   Magnesium 2.7 (H) 1.7 - 2.4 mg/dL  CBC     Status: Abnormal   Collection Time: 10/09/16  8:55 PM  Result Value Ref Range   WBC 8.4 4.0 - 10.5 K/uL   RBC 3.55 (L) 3.87 - 5.11 MIL/uL   Hemoglobin 10.6 (L) 12.0 - 15.0 g/dL   HCT 34.3 (L) 36.0 - 46.0 %   MCV 96.6 78.0 - 100.0 fL   MCH 29.9 26.0 - 34.0 pg   MCHC 30.9 30.0 - 36.0 g/dL   RDW 15.2 11.5 - 15.5 %   Platelets 162 150 - 400 K/uL  POC CBG, ED      Status: Abnormal   Collection Time: 10/09/16  8:55 PM  Result Value Ref Range   Glucose-Capillary 222 (H) 65 - 99 mg/dL  Troponin I (q 6hr x 3)     Status: None   Collection Time: 10/10/16 12:01 AM  Result Value Ref  Range   Troponin I <0.03 <0.03 ng/mL  Protime-INR     Status: None   Collection Time: 10/10/16 12:01 AM  Result Value Ref Range   Prothrombin Time 15.0 11.4 - 15.2 seconds   INR 1.17   MRSA PCR Screening     Status: None   Collection Time: 10/10/16  3:02 AM  Result Value Ref Range   MRSA by PCR NEGATIVE NEGATIVE    Comment:        The GeneXpert MRSA Assay (FDA approved for NASAL specimens only), is one component of a comprehensive MRSA colonization surveillance program. It is not intended to diagnose MRSA infection nor to guide or monitor treatment for MRSA infections.   Glucose, capillary     Status: Abnormal   Collection Time: 10/10/16  3:05 AM  Result Value Ref Range   Glucose-Capillary 205 (H) 65 - 99 mg/dL  Troponin I (q 6hr x 3)     Status: None   Collection Time: 10/10/16  3:41 AM  Result Value Ref Range   Troponin I <0.03 <0.03 ng/mL  Basic metabolic panel     Status: Abnormal   Collection Time: 10/10/16  3:41 AM  Result Value Ref Range   Sodium 135 135 - 145 mmol/L   Potassium 4.0 3.5 - 5.1 mmol/L   Chloride 96 (L) 101 - 111 mmol/L   CO2 25 22 - 32 mmol/L   Glucose, Bld 189 (H) 65 - 99 mg/dL   BUN 57 (H) 6 - 20 mg/dL   Creatinine, Ser 6.53 (H) 0.44 - 1.00 mg/dL   Calcium 8.5 (L) 8.9 - 10.3 mg/dL   GFR calc non Af Amer 6 (L) >60 mL/min   GFR calc Af Amer 7 (L) >60 mL/min    Comment: (NOTE) The eGFR has been calculated using the CKD EPI equation. This calculation has not been validated in all clinical situations. eGFR's persistently <60 mL/min signify possible Chronic Kidney Disease.    Anion gap 14 5 - 15  CBC     Status: Abnormal   Collection Time: 10/10/16  3:41 AM  Result Value Ref Range   WBC 8.5 4.0 - 10.5 K/uL   RBC 3.67 (L) 3.87 -  5.11 MIL/uL   Hemoglobin 11.1 (L) 12.0 - 15.0 g/dL   HCT 35.2 (L) 36.0 - 46.0 %   MCV 95.9 78.0 - 100.0 fL   MCH 30.2 26.0 - 34.0 pg   MCHC 31.5 30.0 - 36.0 g/dL   RDW 15.3 11.5 - 15.5 %   Platelets 171 150 - 400 K/uL  Glucose, capillary     Status: Abnormal   Collection Time: 10/10/16  7:45 AM  Result Value Ref Range   Glucose-Capillary 175 (H) 65 - 99 mg/dL    Dg Chest 2 View  Result Date: 10/09/2016 CLINICAL DATA:  Abnormal electrocardiogram EXAM: CHEST  2 VIEW COMPARISON:  November 21, 2015 FINDINGS: There is no edema or consolidation. Heart is borderline enlarged with pulmonary vascularity within normal limits. No adenopathy. There is aortic atherosclerosis. There is mitral annulus calcification. Patient is status post coronary artery bypass grafting. No bone lesions. IMPRESSION: No edema or consolidation. Borderline cardiomegaly. Aortic atherosclerosis. Electronically Signed   By: Lowella Grip III M.D.   On: 10/09/2016 16:00    ROS: Patient is just complaining of neuropathy and back pain. She denies any dizziness, shortness of breath. The remainder of the review of systems is negative Blood pressure (!) 103/32, pulse 65, temperature 98.1 F (36.7 C),  temperature source Oral, resp. rate 16, height _0  (1.626 m), weight 98.4 kg (217 lb), SpO2 98 %. General appearance: alert, no distress and morbidly obese Neck: no adenopathy, no carotid bruit, no JVD, supple, symmetrical, trachea midline and thyroid not enlarged, symmetric, no tenderness/mass/nodules Resp: clear to auscultation bilaterally Cardio: irregularly irregular rhythm and Bradycardic GI: soft, non-tender; bowel sounds normal; no masses,  no organomegaly Extremities: extremities normal, atraumatic, no cyanosis or edema Left upper extremity AV fistula with a lot of bruising  Assessment/Plan: 71 year old white female with multiple medical problems including ESRD. She is hospitalized with bradycardia 1 bradycardia- beta  blocker has been stopped. We are awaiting a cardiology consult. Patient is asymptomatic. Her potassium is within normal limits, her magnesium is slightly high 2 ESRD: Normally TTS at [facility]. We will do her routine dialysis today via AV fistula. Will try to get her to her dry weight and do on a slightly higher potassium bath so her potassium does not get too low 3 Hypertension: Blood pressure seems normal/low. Metoprolol been discontinued.  He is on full dose Norvasc, I will decrease slightly to 5 mg daily 4. Anemia of ESRD: Apparently not on any ESA as outpatient. Hemoglobin is above 10. We will continue to hold ESA 5. Metabolic Bone Disease: Has on her medication list PhosLo, Auryxia, and Renvela- we'll keep her on only Renvela for now- she does not need all 3   Briana [name] A 10/10/2016, 11:05 AM

## 2016-10-10 NOTE — Progress Notes (Signed)
PROGRESS NOTE    Briana [name]  XBW:[id number] DOB: [birth year] DOA: 10/09/2016 PCP: Antionette Fairy, PA-C   Outpatient Specialists:     Brief Narrative:  Briana [name] is a 71 y.o. female with medical history significant of atrial fibrillation on Coumadin, hypertension, hyperlipidemia, diabetes mellitus, pulmonary hypertension, rheumatic fever, PVD, dCHF, carotid artery stenosis, CAD, CABG, who presents with bradycardia.  Patient states that her heart rate has been slow recently. She was seen by her cardiologist, Dr.KIoneswavan on Friday, who cut her metoprolol dose from 50 to 12.5 mg twice a day. Dr.KIoneswavan was planning to put pt on cardiac monitor, but not done yet. Today, pt went for appointment for AV fistula revision procedure. She was found to have heart rate of 30 and was therefore sent to ED for further eval and treatment. Patient states that she has mild generalized weakness, but no dizziness, chest pain or SOB. Patient denies nausea, vomiting, diarrhea or abdominal pain. No symptoms of UTI or unilateral weakness.   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 -coumadin per pharm, patient is subtherapeutic  Depression and anxiety: Stable, no suicidal or homicidal ideations. -Continue home medications: Valium, Zoloft  HTN: -Continue amlodipine  HLD: -Lipitor  CAD in native artery: s/p of CABG. No chest pain -continue aspirin and Lipitor  DM-II: Last A1c 8.6 on 07/10/16- -on insulin at home-  continue -SSI  ESRD (end stage renal disease) on dialysis [facility]): Patient has been compliant to dialysis. potassium 4.8, bicarbonate 24, BUN 44, creatinine 5.76. -continue Renvela and phoslo -for HD today   DVT prophylaxis:  coumadin  Code Status: Full Code   Family Communication:   Disposition Plan:    Consultants:   Renal  EP      Subjective: C/o weakness- has not been out of bed today  Objective: Vitals:   10/10/16 0045 10/10/16 0115 10/10/16 0145 10/10/16 0245  BP: 98/63   (!) 103/32  Pulse: (!) 33 (!) 106 88 65  Resp: 18 17 12 16   Temp:    98.1 F (36.7 C)  TempSrc:    Oral  SpO2: 96% 97% 95% 98%  Weight:    98.4 kg (217 lb)  Height:    5\' 4"  (1.626 m)   No intake or output data in the 24 hours ending 10/10/16 1156 Filed Weights   10/10/16 0245  Weight: 98.4 kg (217 lb)    Examination:  General exam: Appears calm and comfortable  Respiratory system: diminished, no wheezing Cardiovascular system: irregular No JVD, murmurs, rubs, gallops or clicks. No pedal edema. Gastrointestinal system: Abdomen is nondistended, soft and nontender. No organomegaly or masses felt. Normal bowel sounds heard. Central nervous system: Alert and oriented. No focal neurological deficits. Extremities: Symmetric 5 x 5 power.     Data Reviewed: I have personally reviewed following labs and imaging studies  CBC:  Recent Labs Lab 10/09/16 2055 10/10/16 0341  WBC 8.4 8.5  HGB 10.6* 11.1*  HCT 34.3* 35.2*  MCV 96.6 95.9  PLT 162 171  Basic Metabolic Panel:  Recent Labs Lab 10/09/16 1520 10/09/16 1914 10/10/16 0341  NA 138  --  135  K 4.5  --  4.0  CL 99*  --  96*  CO2 24  --  25  GLUCOSE 121*  --  189*  BUN 44*  --  57*  CREATININE 5.76*  --  6.53*  CALCIUM 9.0  --  8.5*  MG  --  2.7*  --    GFR: Estimated Creatinine Clearance: 9 mL/min (A) (by C-G formula based on SCr of 6.53 mg/dL (H)). Liver Function Tests: No results for input(s): AST, ALT,  ALKPHOS, BILITOT, PROT, ALBUMIN in the last 168 hours. No results for input(s): LIPASE, AMYLASE in the last 168 hours. No results for input(s): AMMONIA in the last 168 hours. Coagulation Profile:  Recent Labs Lab 10/10/16 0001  INR 1.17   Cardiac Enzymes:  Recent Labs Lab 10/10/16 0001 10/10/16 0341 10/10/16 1002  TROPONINI <0.03 <0.03 <0.03   BNP (last 3 results) No results for input(s): PROBNP in the last 8760 hours. HbA1C: No results for input(s): HGBA1C in the last 72 hours. CBG:  Recent Labs Lab 10/09/16 2055 10/10/16 0305 10/10/16 0745 10/10/16 1115  GLUCAP 222* 205* 175* 165*   Lipid Profile: No results for input(s): CHOL, HDL, LDLCALC, TRIG, CHOLHDL, LDLDIRECT in the last 72 hours. Thyroid Function Tests:  Recent Labs  10/09/16 1914  TSH 2.454   Anemia Panel: No results for input(s): VITAMINB12, FOLATE, FERRITIN, TIBC, IRON, RETICCTPCT in the last 72 hours. Urine analysis:    Component Value Date/Time   COLORURINE YELLOW 11/10/2015 1521   APPEARANCEUR CLEAR 11/10/2015 1521   LABSPEC 1.013 11/10/2015 1521   PHURINE 5.5 11/10/2015 1521   GLUCOSEU NEGATIVE 11/10/2015 1521   HGBUR NEGATIVE 11/10/2015 1521   BILIRUBINUR NEGATIVE 11/10/2015 1521   KETONESUR NEGATIVE 11/10/2015 1521   PROTEINUR 100 (A) 11/10/2015 1521   UROBILINOGEN 0.2 03/02/2014 1508   NITRITE NEGATIVE 11/10/2015 1521   LEUKOCYTESUR TRACE (A) 11/10/2015 1521      Recent Results (from the past 240 hour(s))  MRSA PCR Screening     Status: None   Collection Time: 10/10/16  3:02 AM  Result Value Ref Range Status   MRSA by PCR NEGATIVE NEGATIVE Final    Comment:        The GeneXpert MRSA Assay (FDA approved for NASAL specimens only), is one component of a comprehensive MRSA colonization surveillance program. It is not intended to diagnose MRSA infection nor to guide or monitor treatment for MRSA infections.       Anti-infectives    None       Radiology Studies: Dg  Chest 2 View  Result Date: 10/09/2016 CLINICAL DATA:  Abnormal electrocardiogram EXAM: CHEST  2 VIEW COMPARISON:  November 21, 2015 FINDINGS: There is no edema or consolidation. Heart is borderline enlarged with pulmonary vascularity within normal limits. No adenopathy. There is aortic atherosclerosis. There is mitral annulus calcification. Patient is status post coronary artery bypass grafting. No bone lesions. IMPRESSION: No edema or consolidation. Borderline cardiomegaly. Aortic atherosclerosis. Electronically Signed   By: Lowella Grip III M.D.   On: 10/09/2016 16:00        Scheduled Meds: . [START ON 10/11/2016] amLODipine  5 mg Oral Daily  . aspirin EC  81 mg Oral Daily  . atorvastatin  80 mg Oral Q48H  . diclofenac  1 patch Transdermal BID  . insulin aspart  0-9 Units Subcutaneous TID WC  . insulin  aspart protamine- aspart  20 Units Subcutaneous BID WC  . loratadine  10 mg Oral Daily  . rOPINIRole  0.25 mg Oral QHS  . sertraline  200 mg Oral Daily  . sevelamer carbonate  1,600 mg Oral TID WC  . warfarin  7.5 mg Oral ONCE-1800  . Warfarin - Pharmacist Dosing Inpatient   Does not apply q1800   Continuous Infusions:   LOS: 0 days    Time spent: 25 min    [place], DO [facility] Pager [phone]  If 7PM-7AM, please contact night-coverage [email or web address] Password [email or web address] 10/10/2016, 11:56 AM

## 2016-10-11 ENCOUNTER — Encounter (HOSPITAL_COMMUNITY): Payer: Self-pay

## 2016-10-11 ENCOUNTER — Telehealth: Payer: Self-pay | Admitting: Vascular Surgery

## 2016-10-11 ENCOUNTER — Ambulatory Visit (HOSPITAL_COMMUNITY)
Admission: RE | Admit: 2016-10-11 | Discharge: 2016-10-11 | Disposition: A | Discharge status: Home / Self Care | Payer: Medicare Other | Source: Ambulatory Visit | Attending: Vascular Surgery | Admitting: Vascular Surgery

## 2016-10-11 DIAGNOSIS — R001 Bradycardia, unspecified: Principal | ICD-10-CM

## 2016-10-11 LAB — PROTIME-INR
INR: 1.18
Prothrombin Time: 15.1 seconds (ref 11.4–15.2)

## 2016-10-11 LAB — GLUCOSE, CAPILLARY
GLUCOSE-CAPILLARY: 223 mg/dL — AB (ref 65–99)
GLUCOSE-CAPILLARY: 141 mg/dL — AB (ref 65–99)
Glucose-Capillary: 226 mg/dL — ABNORMAL HIGH (ref 65–99)
Glucose-Capillary: 247 mg/dL — ABNORMAL HIGH (ref 65–99)

## 2016-10-11 MED ORDER — WARFARIN SODIUM 7.5 MG PO TABS
7.5000 mg | ORAL_TABLET | Freq: Once | ORAL | Status: AC
  Administered 2016-10-11: 7.5 mg via ORAL
  Filled 2016-10-11: qty 1

## 2016-10-11 MED ORDER — ENOXAPARIN SODIUM 100 MG/ML ~~LOC~~ SOLN
1.0000 mg/kg | SUBCUTANEOUS | Status: DC
  Administered 2016-10-11 – 2016-10-12 (×2): 95 mg via SUBCUTANEOUS
  Filled 2016-10-11 (×2): qty 1

## 2016-10-11 NOTE — Progress Notes (Signed)
ANTICOAGULATION CONSULT NOTE - [place] for warfarin Indication: atrial fibrillation  Allergies  Allergen Reactions  . Oxycodone Other (See Comments)    Hallucinations   . Clonazepam Other (See Comments)    Sleepy   . Gemfibrozil Other (See Comments)    Patient is not aware of this allergy but states that she had side effects to a cholesterol medication in the past  . Tape Other (See Comments)    TAPE WILL CAUSE BLISTERS/SORES; PLEASE USE COBAN WRAP!!  . Carbamazepine Other (See Comments)    Reaction to tegretol - "loopy"  . Macrobid [Nitrofurantoin] Nausea And Vomiting    Patient Measurements: Height: 5\' 4"  (162.6 cm) Weight: 211 lb 4.8 oz (95.8 kg) IBW/kg (Calculated) : 54.7  Vital Signs: Temp: 97.7 F (36.5 C) (05/18 0914) Temp Source: Oral (05/18 0914) BP: 87/27 (05/18 0917) Pulse Rate: 36 (05/18 0508)  Labs:  Recent Labs  10/09/16 1520 10/09/16 2055 10/10/16 0001 10/10/16 0341 10/10/16 1002 10/11/16 0446  HGB  --  10.6*  --  11.1*  --   --   HCT  --  34.3*  --  35.2*  --   --   PLT  --  162  --  171  --   --   LABPROT  --   --  15.0  --   --  15.1  INR  --   --  1.17  --   --  1.18  CREATININE 5.76*  --   --  6.53*  --   --   TROPONINI  --   --  <0.03 <0.03 <0.03  --     Estimated Creatinine Clearance: 8.9 mL/min (A) (by C-G formula based on SCr of 6.53 mg/dL (H)).   Medical History: Past Medical History:  Diagnosis Date  . Adenomatous colon polyp 09/2007  . Arthritis   . Atrial fibrillation with rapid ventricular response ([place]) 10/05/2015  . CAD (coronary artery disease)    a. s/p CABG 2005.  . Carotid artery disease ([place])    a. s/p R CEA 2011. Followed by vascular -  duplex 04/16/16: <17% RICA, 49-44% LICA.  Marland Kitchen Chronic anemia   . Chronic diastolic CHF (congestive heart failure) ([place])   . Depression with anxiety   . Diabetes mellitus   . ESRD (end stage renal disease) ([place])    T Th Sat  . Heart murmur   .  Hyperlipidemia   . Hypertension   . Neuropathy   . PAD (peripheral artery disease) ([place])   . Paroxysmal atrial flutter ([place])   . Peripheral vascular disease ([place])   . Pulmonary hypertension ([place])   . Rheumatic fever   . Sleep apnea     Medications:  PTA warfarin dose: 5mg  daily (last dose 10/08/16)  Assessment: 71 yo F with history of afib on warfarin PTA. Also with hx DVT noted. Pharmacy consulted to dose warfarin inpatient. Per EDP note, pt reports large bruise from dialysis on L upper arm last week and was off warfarin.  Pt states she has been back on her warfarin the past 3 days PTA. INR subtherapeutic (1.17) on admit, Hg 10.6, plt wnl. No bleed documented.  INR remains subtherapeutic.  Asked by MD to add Lovenox bridging.  Goal of Therapy:  INR 2-3 Anti-Xa level 0.6-1 units/ml 4hrs after LMWH dose given Monitor platelets by anticoagulation protocol: Yes   Plan:  Daily INR Coumadin 7.5mg  po tonight Lovenox 95 mg SQ q24h  Taleyah Hillman, Pharm.D.,  BCPS Clinical Pharmacist Pager: [phone] Clinical phone for 10/11/2016 from 8:30-4:00 is [phone]. After 4pm, please call Main [facility] ([phone]) for assistance. 10/11/2016 10:57 AM

## 2016-10-11 NOTE — Plan of Care (Signed)
Problem: Safety: Goal: Ability to remain free from injury will improve Outcome: Progressing Patient voices understanding of using the call bell to call for assistance when needing to gert up for restroom.

## 2016-10-11 NOTE — Progress Notes (Signed)
Subjective:   No new c/o's- did not sleep well.  Had HD late yesterday - removed 2200- were able to stick her AVF well- she was happy   Objective Vital signs in last 24 hours: Vitals:   10/11/16 0800 10/11/16 0835 10/11/16 0914 10/11/16 0917  BP:  115/61 (!) 71/29 (!) 87/27  Pulse:      Resp:      Temp:   97.7 F (36.5 C)   TempSrc:   Oral   SpO2: 100%  97%   Weight:      Height:       Weight change: 0.269 kg (9.5 oz)  Intake/Output Summary (Last 24 hours) at 10/11/16 1041 Last data filed at 10/11/16 0800  Gross per 24 hour  Intake              490 ml  Output             2200 ml  Net            -1710 ml    Assessment/Plan: 71 year old white female with multiple medical problems including ESRD. She is hospitalized with bradycardia 1 bradycardia- beta blocker has been stopped. We are awaiting a cardiology follow up. Patient is asymptomatic. Her potassium is within normal limits, her magnesium is slightly high 2 ESRD: Normally TTS at [facility]. We did her routine dialysis 5/17 via AV fistula. Will try to get her to her dry weight and do on a slightly higher potassium bath so her potassium does not get too low.  Will have orders for tomorrow if needs to stay but might be discharged?? 3 Hypertension: Blood pressure seems normal/low. Metoprolol been discontinued.  she is on full dose Norvasc, I have decreased to 5 mg daily 4. Anemia of ESRD: Apparently not on any ESA as outpatient. Hemoglobin is above 10. We will continue to hold ESA 5. Metabolic Bone Disease: Has on her medication list PhosLo, Auryxia, and Renvela- we'll keep her on only Renvela for now- she does not need all 3     Aeryn Medici A    Labs: Basic Metabolic Panel:  Recent Labs Lab 10/09/16 1520 10/10/16 0341  NA 138 135  K 4.5 4.0  CL 99* 96*  CO2 24 25  GLUCOSE 121* 189*  BUN 44* 57*  CREATININE 5.76* 6.53*  CALCIUM 9.0 8.5*   Liver Function Tests: No results for input(s): AST, ALT,  ALKPHOS, BILITOT, PROT, ALBUMIN in the last 168 hours. No results for input(s): LIPASE, AMYLASE in the last 168 hours. No results for input(s): AMMONIA in the last 168 hours. CBC:  Recent Labs Lab 10/09/16 2055 10/10/16 0341  WBC 8.4 8.5  HGB 10.6* 11.1*  HCT 34.3* 35.2*  MCV 96.6 95.9  PLT 162 171   Cardiac Enzymes:  Recent Labs Lab 10/10/16 0001 10/10/16 0341 10/10/16 1002  TROPONINI <0.03 <0.03 <0.03   CBG:  Recent Labs Lab 10/10/16 0305 10/10/16 0745 10/10/16 1115 10/10/16 2106 10/11/16 0729  GLUCAP 205* 175* 165* 173* 226*    Iron Studies: No results for input(s): IRON, TIBC, TRANSFERRIN, FERRITIN in the last 72 hours. Studies/Results: Dg Chest 2 View  Result Date: 10/09/2016 CLINICAL DATA:  Abnormal electrocardiogram EXAM: CHEST  2 VIEW COMPARISON:  November 21, 2015 FINDINGS: There is no edema or consolidation. Heart is borderline enlarged with pulmonary vascularity within normal limits. No adenopathy. There is aortic atherosclerosis. There is mitral annulus calcification. Patient is status post coronary artery bypass grafting. No bone lesions. IMPRESSION: No  edema or consolidation. Borderline cardiomegaly. Aortic atherosclerosis. Electronically Signed   By: Lowella Grip III M.D.   On: 10/09/2016 16:00   Medications: Infusions:   Scheduled Medications: . amLODipine  5 mg Oral Daily  . aspirin EC  81 mg Oral Daily  . atorvastatin  80 mg Oral Q48H  . diclofenac  1 patch Transdermal BID  . insulin aspart  0-9 Units Subcutaneous TID WC  . insulin aspart protamine- aspart  20 Units Subcutaneous BID WC  . loratadine  10 mg Oral Daily  . rOPINIRole  0.25 mg Oral QHS  . sertraline  200 mg Oral Daily  . sevelamer carbonate  1,600 mg Oral TID WC  . Warfarin - Pharmacist Dosing Inpatient   Does not apply q1800    have reviewed scheduled and prn medications.  Physical Exam: General: NAD- obese Heart: brady, irreg Lungs: mostly clear Abdomen: obese, soft,  non tender Extremities: no peripheral edema Dialysis Access: left upper AVF- lots of bruising     10/11/2016,10:41 AM  LOS: 1 day

## 2016-10-11 NOTE — Progress Notes (Signed)
PROGRESS NOTE    Briana [name]  UXN:[id number] DOB: 1945/08/18 DOA: 10/09/2016 PCP: Antionette Fairy, PA-C   Outpatient Specialists:     Brief Narrative:  Briana [name] is a 71 y.o. female with medical history significant of atrial fibrillation on Coumadin, hypertension, hyperlipidemia, diabetes mellitus, pulmonary hypertension, rheumatic fever, PVD, dCHF, carotid artery stenosis, CAD, CABG, who presents with bradycardia.  Patient states that her heart rate has been slow recently. She was seen by her cardiologist, Dr.KIoneswavan on Friday, who cut her metoprolol dose from 50 to 12.5 mg twice a day. Dr.KIoneswavan was planning to put pt on cardiac monitor, but not done yet. Today, pt went for appointment for AV fistula revision procedure. She was found to have heart rate of 30 and was therefore sent to ED for further eval and treatment. Patient states that she has mild generalized weakness, but no dizziness, chest pain or SOB. Patient denies nausea, vomiting, diarrhea or abdominal pain. No symptoms of UTI or unilateral weakness.     Subjective:  Patient in bed, appears comfortable, denies any headache, no fever, no chest pain or pressure, no shortness of breath , no abdominal pain. No focal weakness.   Assessment & Plan:    Bradycardia:  with underlying history of chronic atrial fibrillation. Question sick sinus syndrome. Cardiology/EP following. Currently home dose Lopressor 12.5 twice a day has been held, TSH stable, will defer further management to cardiology. Had one episode of bradycardia last night. Currently symptom-free.  Atrial Fibrillation: CHA2DS2-VASc Score is 6, on Coumadin with INR subtherapeutic, will bridge with Lovenox while she is here. Pharmacy on board.  Depression and anxiety:  stable on home medications which are Valium and Zoloft.   HTN: Stable on Norvasc.  HLD: Continue home dose Lipitor.   CAD in native artery: Status post CABG, no acute  issues no chest pain, on aspirin and Lipitor for secondary prevention. Beta blocker held due to episodes of bradycardia as above.  ESRD (end stage renal disease) on dialysis [facility]):  nephrology on board being dialyzed per schedule which is Tuesday Thursday and Saturday. 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   CBG (last 3)   Recent Labs  10/10/16 1115 10/10/16 2106 10/11/16 0729  GLUCAP 165* 173* 226*     DVT prophylaxis:  Coumadin/Lovenox  Code Status: Full Code   Family Communication:   Disposition Plan:    Consultants:   Renal  EP   Objective: Vitals:   10/11/16 0800 10/11/16 0835 10/11/16 0914 10/11/16 0917  BP:  115/61 (!) 71/29 (!) 87/27  Pulse:      Resp:      Temp:   97.7 F (36.5 C)   TempSrc:   Oral   SpO2: 100%  97%   Weight:      Height:        Intake/Output Summary (Last 24 hours) at 10/11/16 1027 Last data filed at 10/11/16 0800  Gross per 24 hour  Intake              490 ml  Output             2200 ml  Net            -1710 ml   Filed Weights   10/10/16 1555 10/10/16 2008 10/11/16 0508  Weight: 98.7 kg (217 lb 9.5 oz) 95.5 kg (210 lb 8.6 oz) 95.8 kg (211 lb 4.8 oz)  Examination:  Awake Alert, Oriented X 3, No new F.N deficits, Normal affect [place].AT,PERRAL Supple Neck,No JVD, No cervical lymphadenopathy appriciated.  Symmetrical Chest wall movement, Good air movement bilaterally, CTAB RRR,No Gallops,Rubs or new Murmurs, No Parasternal Heave +ve B.Sounds, Abd Soft, No tenderness, No organomegaly appriciated, No rebound - guarding or rigidity. No Cyanosis, Clubbing or edema, No new Rash or bruise    Data Reviewed: I have personally reviewed following labs and imaging studies  CBC:  Recent Labs Lab 10/09/16 2055 10/10/16 0341  WBC 8.4 8.5  HGB 10.6* 11.1*  HCT 34.3* 35.2*  MCV 96.6 95.9  PLT 162 829   Basic Metabolic Panel:  Recent Labs Lab  10/09/16 1520 10/09/16 1914 10/10/16 0341  NA 138  --  135  K 4.5  --  4.0  CL 99*  --  96*  CO2 24  --  25  GLUCOSE 121*  --  189*  BUN 44*  --  57*  CREATININE 5.76*  --  6.53*  CALCIUM 9.0  --  8.5*  MG  --  2.7*  --    GFR: Estimated Creatinine Clearance: 8.9 mL/min (A) (by C-G formula based on SCr of 6.53 mg/dL (H)). Liver Function Tests: No results for input(s): AST, ALT, ALKPHOS, BILITOT, PROT, ALBUMIN in the last 168 hours. No results for input(s): LIPASE, AMYLASE in the last 168 hours. No results for input(s): AMMONIA in the last 168 hours. Coagulation Profile:  Recent Labs Lab 10/10/16 0001 10/11/16 0446  INR 1.17 1.18   Cardiac Enzymes:  Recent Labs Lab 10/10/16 0001 10/10/16 0341 10/10/16 1002  TROPONINI <0.03 <0.03 <0.03   BNP (last 3 results) No results for input(s): PROBNP in the last 8760 hours. HbA1C: No results for input(s): HGBA1C in the last 72 hours. CBG:  Recent Labs Lab 10/10/16 0305 10/10/16 0745 10/10/16 1115 10/10/16 2106 10/11/16 0729  GLUCAP 205* 175* 165* 173* 226*   Lipid Profile: No results for input(s): CHOL, HDL, LDLCALC, TRIG, CHOLHDL, LDLDIRECT in the last 72 hours. Thyroid Function Tests:  Recent Labs  10/09/16 1914  TSH 2.454   Anemia Panel: No results for input(s): VITAMINB12, FOLATE, FERRITIN, TIBC, IRON, RETICCTPCT in the last 72 hours. Urine analysis:    Component Value Date/Time   COLORURINE YELLOW 11/10/2015 1521   APPEARANCEUR CLEAR 11/10/2015 1521   LABSPEC 1.013 11/10/2015 1521   PHURINE 5.5 11/10/2015 1521   GLUCOSEU NEGATIVE 11/10/2015 1521   HGBUR NEGATIVE 11/10/2015 1521   BILIRUBINUR NEGATIVE 11/10/2015 1521   KETONESUR NEGATIVE 11/10/2015 1521   PROTEINUR 100 (A) 11/10/2015 1521   UROBILINOGEN 0.2 03/02/2014 1508   NITRITE NEGATIVE 11/10/2015 1521   LEUKOCYTESUR TRACE (A) 11/10/2015 1521      Recent Results (from the past 240 hour(s))  MRSA PCR Screening     Status: None    Collection Time: 10/10/16  3:02 AM  Result Value Ref Range Status   MRSA by PCR NEGATIVE NEGATIVE Final    Comment:        The GeneXpert MRSA Assay (FDA approved for NASAL specimens only), is one component of a comprehensive MRSA colonization surveillance program. It is not intended to diagnose MRSA infection nor to guide or monitor treatment for MRSA infections.       Anti-infectives    None       Radiology Studies: Dg Chest 2 View  Result Date: 10/09/2016 CLINICAL DATA:  Abnormal electrocardiogram EXAM: CHEST  2 VIEW COMPARISON:  November 21, 2015 FINDINGS: There is no  edema or consolidation. Heart is borderline enlarged with pulmonary vascularity within normal limits. No adenopathy. There is aortic atherosclerosis. There is mitral annulus calcification. Patient is status post coronary artery bypass grafting. No bone lesions. IMPRESSION: No edema or consolidation. Borderline cardiomegaly. Aortic atherosclerosis. Electronically Signed   By: Lowella Grip III M.D.   On: 10/09/2016 16:00    Scheduled Meds: . amLODipine  5 mg Oral Daily  . aspirin EC  81 mg Oral Daily  . atorvastatin  80 mg Oral Q48H  . diclofenac  1 patch Transdermal BID  . insulin aspart  0-9 Units Subcutaneous TID WC  . insulin aspart protamine- aspart  20 Units Subcutaneous BID WC  . loratadine  10 mg Oral Daily  . rOPINIRole  0.25 mg Oral QHS  . sertraline  200 mg Oral Daily  . sevelamer carbonate  1,600 mg Oral TID WC  . Warfarin - Pharmacist Dosing Inpatient   Does not apply q1800   Continuous Infusions:   LOS: 1 day    Time spent: 25 min  Signature  Lala Lund M.D on 10/11/2016 at 10:27 AM  Between 7am to 7pm - Pager - [phone] ( page via [email or web address], text pages only, please mention full 10 digit call back number).  After 7pm go to [email or web address] - password [email or web address]

## 2016-10-11 NOTE — Telephone Encounter (Signed)
Patient's daughter called to reschedule her mother's appt for CTA Neck and follow up to see TFE. I rescheduled the CTA Neck for 11/21/16 at 1pm. She needs to arrive at 12:45pm at [place]. Then she is to see Dr.Early here at [facility] on 11/26/16 at 3:45pm. She requested a late day appt due to her dialysis schedule. awt

## 2016-10-12 LAB — RENAL FUNCTION PANEL
ALBUMIN: 3.6 g/dL (ref 3.5–5.0)
ANION GAP: 16 — AB (ref 5–15)
BUN: 51 mg/dL — ABNORMAL HIGH (ref 6–20)
CHLORIDE: 92 mmol/L — AB (ref 101–111)
CO2: 23 mmol/L (ref 22–32)
Calcium: 8.8 mg/dL — ABNORMAL LOW (ref 8.9–10.3)
Creatinine, Ser: 6.28 mg/dL — ABNORMAL HIGH (ref 0.44–1.00)
GFR calc Af Amer: 7 mL/min — ABNORMAL LOW (ref >60)
GFR calc non Af Amer: 6 mL/min — ABNORMAL LOW (ref >60)
GLUCOSE: 181 mg/dL — AB (ref 65–99)
PHOSPHORUS: 5.8 mg/dL — AB (ref 2.5–4.6)
POTASSIUM: 4 mmol/L (ref 3.5–5.1)
Sodium: 131 mmol/L — ABNORMAL LOW (ref 135–145)

## 2016-10-12 LAB — GLUCOSE, CAPILLARY: GLUCOSE-CAPILLARY: 185 mg/dL — AB (ref 65–99)

## 2016-10-12 LAB — CBC
HEMATOCRIT: 37.1 % (ref 36.0–46.0)
HEMOGLOBIN: 11.9 g/dL — AB (ref 12.0–15.0)
MCH: 30.1 pg (ref 26.0–34.0)
MCHC: 32.1 g/dL (ref 30.0–36.0)
MCV: 93.9 fL (ref 78.0–100.0)
Platelets: 192 10*3/uL (ref 150–400)
RBC: 3.95 MIL/uL (ref 3.87–5.11)
RDW: 15 % (ref 11.5–15.5)
WBC: 8.5 10*3/uL (ref 4.0–10.5)

## 2016-10-12 LAB — PROTIME-INR
INR: 1.29
PROTHROMBIN TIME: 16.2 s — AB (ref 11.4–15.2)

## 2016-10-12 MED ORDER — ENOXAPARIN SODIUM 100 MG/ML ~~LOC~~ SOLN: 100.0000 mg | SUBCUTANEOUS | 0 refills | Status: DC

## 2016-10-12 MED ORDER — DICLOFENAC EPOLAMINE 1.3 % TD PTCH
1.0000 | MEDICATED_PATCH | Freq: Two times a day (BID) | TRANSDERMAL | 0 refills | Status: DC
Start: 2016-10-12 — End: 2016-11-12

## 2016-10-12 MED ORDER — WARFARIN SODIUM 5 MG PO TABS: 5.0000 mg | ORAL_TABLET | Freq: Once | ORAL | Status: DC

## 2016-10-12 NOTE — Discharge Instructions (Signed)
Follow with Primary MD Antionette Fairy, PA-C in 2 days   Get CBC, BMP, INR, 2 view Chest X ray checked  by Primary MD  in2 days ( we routinely change or add medications that can affect your baseline labs and fluid status, therefore we recommend that you get the mentioned basic workup next visit with your PCP, your PCP may decide not to get them or add new tests based on their clinical decision)  Activity: As tolerated with Full fall precautions use walker/cane & assistance as needed  Disposition Home    Diet:   Diet renal/carb modified with fluid restriction of 1.5lit/day.  For Heart failure patients - Check your Weight same time everyday, if you gain over 2 pounds, or you develop in leg swelling, experience more shortness of breath or chest pain, call your Primary MD immediately. Follow Cardiac Low Salt Diet and 1.5 lit/day fluid restriction.  On your next visit with your primary care physician please Get Medicines reviewed and adjusted.  Please request your Prim.MD to go over all Hospital Tests and Procedure/Radiological results at the follow up, please get all Hospital records sent to your Prim MD by signing hospital release before you go home.  If you experience worsening of your admission symptoms, develop shortness of breath, life threatening emergency, suicidal or homicidal thoughts you must seek medical attention immediately by calling 911 or calling your MD immediately  if symptoms less severe.  You Must read complete instructions/literature along with all the possible adverse reactions/side effects for all the Medicines you take and that have been prescribed to you. Take any new Medicines after you have completely understood and accpet all the possible adverse reactions/side effects.   Do not drive, operate heavy machinery, perform activities at heights, swimming or participation in water activities or provide baby sitting services if your were admitted for syncope or siezures until you  have seen by Primary MD or a Neurologist and advised to do so again.  Do not drive when taking Pain medications.    Do not take more than prescribed Pain, Sleep and Anxiety Medications  Special Instructions: If you have smoked or chewed Tobacco  in the last 2 yrs please stop smoking, stop any regular Alcohol  and or any Recreational drug use.  Wear Seat belts while driving.   Please note  You were cared for by a hospitalist during your hospital stay. If you have any questions about your discharge medications or the care you received while you were in the hospital after you are discharged, you can call the unit and asked to speak with the hospitalist on call if the hospitalist that took care of you is not available. Once you are discharged, your primary care physician will handle any further medical issues. Please note that NO REFILLS for any discharge medications will be authorized once you are discharged, as it is imperative that you return to your primary care physician (or establish a relationship with a primary care physician if you do not have one) for your aftercare needs so that they can reassess your need for medications and monitor your lab values.

## 2016-10-12 NOTE — Procedures (Signed)
Patient was seen on dialysis and the procedure was supervised.  Just getting started -Via AVF BP is  88/42.   Patient appears to be tolerating treatment well- goal only set for 2000  Briana [name] A 10/12/2016

## 2016-10-12 NOTE — Discharge Summary (Signed)
Briana [name]:[id number] DOB: 09-05-1945 DOA: 10/09/2016  PCP: Antionette Fairy, PA-C  Admit date: 10/09/2016  Discharge date: 10/12/2016  Admitted From: Home   Disposition:  Home   Recommendations for Outpatient Follow-up:   Follow up with PCP in 1-2 weeks  PCP Please obtain BMP/CBC, 2 view CXR in 1week,  (see Discharge instructions)   PCP Please follow up on the following pending results: monitor her closely, stop Lovenox once INR hits 2.   Home Health: RN, PT  Equipment/Devices: None  Consultations: Renal, Cards, EP Discharge Condition: Fair   CODE STATUS: Full   Diet Recommendation: Diet renal/carb modified with fluid restriction of 1.5 L per day  Chief Complaint  Patient presents with  . Abnormal ECG     Brief history of present illness from the day of admission and additional interim summary    Briana J Watlingtonis a 71 y.o.femalewith medical history significant of atrial fibrillation on Coumadin, hypertension, hyperlipidemia, diabetes mellitus, pulmonary hypertension, rheumatic fever, PVD, dCHF, carotid artery stenosis, CAD, CABG, whopresents with bradycardia.  Patient states that her heart rate has been slow recently. She was seen by her cardiologist, Dr.KIoneswavan on Friday, who cut her metoprolol dose from 50 to 12.5 mg twice a day. Dr.KIoneswavan was planning to put pt on cardiac monitor, but not done yet. Today, pt went for appointment for AV fistula revision procedure. She was found to have heart rate of30 and was therefore sent to ED for further eval and treatment. Patient states that she has mild generalized weakness, but no dizziness, chest pain or SOB. Patient denies nausea, vomiting, diarrhea or abdominal pain. No symptoms of UTI or unilateral weakness.                                       Hospital Course    Bradycardia: with underlying history of chronic atrial fibrillation. Question sick sinus syndrome. Cardiology/EP Saw the patient, TSH was stable and her beta blocker has been stopped, she is symptom free and due to her comorbidities medical management was thought to be the best by cardiology/EP. She was seen today and cleared for home discharge by cardiology and EP without beta blocker which will be done, will follow with primary cardiologist Dr. Bronson Ing post discharge.  Chronic Atrial Fibrillation/ Flutter: CHA2DS2-VASc Scoreis 6, on Coumadin with INR subtherapeutic, will bridge with Lovenox , PCP to monitor INR and stop Lovenox once INR reaches 2.  Depression and anxiety: stable on home medications which are Valium and Zoloft.   HTN: Stable on Norvasc.  HLD: Continue home dose Lipitor.   CAD in native artery: Status post CABG, no acute issues no chest pain, on aspirin and Lipitor for secondary prevention. Beta blocker held due to episodes of bradycardia as above.  ESRD (end stage renal disease) on dialysis [facility]): nephrology on board being dialyzed per schedule which is Tuesday Thursday and Saturday. Continue PhosLo and Renvela.  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    Discharge diagnosis     Principal Problem:   Bradycardia Active Problems:   Depressed mood   Essential hypertension, benign   Mixed hyperlipidemia   CAD in native artery   Hx of CABG 2005   Paroxysmal atrial fibrillation (HCC)   Diabetes mellitus with end-stage renal disease ([place])   ESRD (end stage renal disease) on dialysis [facility])    Discharge instructions    Discharge Instructions    Discharge instructions    Complete by:  As directed    Follow with Primary MD Antionette Fairy, PA-C in 2 days   Get CBC, BMP, INR, 2 view Chest X ray checked  by Primary MD  in2 days  ( we routinely change or add medications that can affect your baseline labs and fluid status, therefore we recommend that you get the mentioned basic workup next visit with your PCP, your PCP may decide not to get them or add new tests based on their clinical decision)  Activity: As tolerated with Full fall precautions use walker/cane & assistance as needed  Disposition Home    Diet:   Diet renal/carb modified with fluid restriction of 1.5lit/day.  For Heart failure patients - Check your Weight same time everyday, if you gain over 2 pounds, or you develop in leg swelling, experience more shortness of breath or chest pain, call your Primary MD immediately. Follow Cardiac Low Salt Diet and 1.5 lit/day fluid restriction.  On your next visit with your primary care physician please Get Medicines reviewed and adjusted.  Please request your Prim.MD to go over all Hospital Tests and Procedure/Radiological results at the follow up, please get all Hospital records sent to your Prim MD by signing hospital release before you go home.  If you experience worsening of your admission symptoms, develop shortness of breath, life threatening emergency, suicidal or homicidal thoughts you must seek medical attention immediately by calling 911 or calling your MD immediately  if symptoms less severe.  You Must read complete instructions/literature along with all the possible adverse reactions/side effects for all the Medicines you take and that have been prescribed to you. Take any new Medicines after you have completely understood and accpet all the possible adverse reactions/side effects.   Do not drive, operate heavy machinery, perform activities at heights, swimming or participation in water activities or provide baby sitting services if your were admitted for syncope or siezures until you have seen by Primary MD or a Neurologist and advised to do so again.  Do not drive when taking Pain medications.    Do not  take more than prescribed Pain, Sleep and Anxiety Medications  Special Instructions: If you have smoked or chewed Tobacco  in the last 2 yrs please stop smoking, stop any regular Alcohol  and or any Recreational drug use.  Wear Seat belts while driving.   Please note  You were cared for by a hospitalist during your hospital stay. If you have any questions about your discharge medications or the care you received while you were in the hospital after you are discharged, you can call the unit and asked to speak with the hospitalist on call if the hospitalist that took care of you is not available. Once you are discharged, your primary care physician will handle any further medical issues. Please note that NO REFILLS for any discharge medications will be authorized once you are discharged, as it  is imperative that you return to your primary care physician (or establish a relationship with a primary care physician if you do not have one) for your aftercare needs so that they can reassess your need for medications and monitor your lab values.   Increase activity slowly    Complete by:  As directed       Discharge Medications   Allergies as of 10/12/2016      Reactions   Oxycodone Other (See Comments)   Hallucinations   Clonazepam Other (See Comments)   Sleepy    Gemfibrozil Other (See Comments)   Patient is not aware of this allergy but states that she had side effects to a cholesterol medication in the past   Tape Other (See Comments)   TAPE WILL CAUSE BLISTERS/SORES; PLEASE USE COBAN WRAP!!   Carbamazepine Other (See Comments)   Reaction to tegretol - "loopy"   Macrobid [nitrofurantoin] Nausea And Vomiting      Medication List    STOP taking these medications   metoprolol tartrate 25 MG tablet Commonly known as:  LOPRESSOR     TAKE these medications   amLODipine 10 MG tablet Commonly known as:  NORVASC Take 10 mg by mouth daily.   aspirin 81 MG EC tablet Take 1 tablet (81 mg  total) by mouth daily.   atorvastatin 80 MG tablet Commonly known as:  LIPITOR Take 1 tablet (80 mg total) by mouth daily. What changed:  when to take this   blood glucose meter kit and supplies Kit Dispense based on patient and insurance preference. Use up to four times daily as directed. (FOR ICD-10 E11.65)   calcium acetate 667 MG capsule Commonly known as:  PHOSLO Take 2 capsules (1,334 mg total) by mouth 3 (three) times daily with meals. What changed:  how much to take   cetirizine 10 MG tablet Commonly known as:  ZYRTEC Take 10 mg by mouth daily.   diazepam 5 MG tablet Commonly known as:  VALIUM Take 5 mg by mouth 3 (three) times daily as needed for anxiety.   enoxaparin 100 MG/ML injection Commonly known as:  LOVENOX Inject 1 mL (100 mg total) into the skin daily.   ferric citrate 1 GM 210 MG(Fe) tablet Commonly known as:  AURYXIA Take 210 mg by mouth 3 (three) times daily with meals.   HYDROcodone-acetaminophen 5-325 MG tablet Commonly known as:  NORCO/VICODIN Take 1 tablet by mouth every 6 (six) hours as needed for moderate pain (Must last 30 days.Do not take and drive a car or use machinery.).   Insulin Lispro Prot & Lispro (75-25) 100 UNIT/ML Kwikpen Commonly known as:  HUMALOG MIX 75/25 KWIKPEN Used 35 units with breakfast and 30 units with supper when pre-meal blood glucose is above 90 mg/dL. What changed:  how much to take  how to take this  when to take this  additional instructions   nitroGLYCERIN 0.4 MG SL tablet Commonly known as:  NITROSTAT Place 1 tablet (0.4 mg total) under the tongue every 5 (five) minutes as needed for chest pain. Max three pills   polyethylene glycol packet Commonly known as:  MIRALAX / GLYCOLAX Take 17 g by mouth 2 (two) times daily. What changed:  when to take this  reasons to take this   protein supplement Powd Take 6 g by mouth 3 (three) times daily with meals.   rOPINIRole 0.25 MG tablet Commonly known  as:  REQUIP Take 1 tablet (0.25 mg total) by mouth at bedtime.  sertraline 100 MG tablet Commonly known as:  ZOLOFT Take 200 mg by mouth daily.   sevelamer carbonate 800 MG tablet Commonly known as:  RENVELA Take 800 mg by mouth 3 (three) times daily with meals.   Vitamin D (Ergocalciferol) 50000 units Caps capsule Commonly known as:  DRISDOL Take 50,000 Units by mouth every 7 (seven) days.   warfarin 5 MG tablet Commonly known as:  COUMADIN TAKE AS DIRECTED What changed:  See the new instructions.       Follow-up Information    Sharee Pimple, Alfonso Ellis. Schedule an appointment as soon as possible for a visit in 2 day(s).   Specialty:  Physician Assistant Contact information: [address] [place] [address] [phone]        Herminio Commons, MD. Schedule an appointment as soon as possible for a visit in 3 day(s).   Specialty:  Cardiology Why:  INR check, Heart rate check Contact information: [place] [address] [phone]           Major procedures and Radiology Reports - PLEASE review detailed and final reports thoroughly  -         Dg Chest 2 View  Result Date: 10/09/2016 CLINICAL DATA:  Abnormal electrocardiogram EXAM: CHEST  2 VIEW COMPARISON:  November 21, 2015 FINDINGS: There is no edema or consolidation. Heart is borderline enlarged with pulmonary vascularity within normal limits. No adenopathy. There is aortic atherosclerosis. There is mitral annulus calcification. Patient is status post coronary artery bypass grafting. No bone lesions. IMPRESSION: No edema or consolidation. Borderline cardiomegaly. Aortic atherosclerosis. Electronically Signed   By: Lowella Grip III M.D.   On: 10/09/2016 16:00    Micro Results     Recent Results (from the past 240 hour(s))  MRSA PCR Screening     Status: None   Collection Time: 10/10/16  3:02 AM  Result Value Ref Range Status   MRSA by PCR NEGATIVE NEGATIVE Final     Comment:        The GeneXpert MRSA Assay (FDA approved for NASAL specimens only), is one component of a comprehensive MRSA colonization surveillance program. It is not intended to diagnose MRSA infection nor to guide or monitor treatment for MRSA infections.     Today   Subjective    Ilyana Baquera today has no headache,no chest abdominal pain,no new weakness tingling or numbness, feels much better wants to go home today.     Objective   Blood pressure (!) 124/39, pulse 100, temperature 97.6 F (36.4 C), temperature source Oral, resp. rate 20, height '5\' 4"'  (1.626 m), weight 97.2 kg (214 lb 3.2 oz), SpO2 98 %.   Intake/Output Summary (Last 24 hours) at 10/12/16 0928 Last data filed at 10/11/16 2215  Gross per 24 hour  Intake              200 ml  Output                0 ml  Net              200 ml    Exam Awake Alert, Oriented x 3, No new F.N deficits, Normal affect [place].AT,PERRAL Supple Neck,No JVD, No cervical lymphadenopathy appriciated.  Symmetrical Chest wall movement, Good air movement bilaterally, CTAB RRR,No Gallops,Rubs or new Murmurs, No Parasternal Heave +ve B.Sounds, Abd Soft, Non tender, No organomegaly appriciated, No rebound -guarding or rigidity. No Cyanosis, Clubbing or edema, No new Rash  or bruise   Data Review   CBC w Diff: Lab Results  Component Value Date   WBC 8.5 10/12/2016   HGB 11.9 (L) 10/12/2016   HCT 37.1 10/12/2016   PLT 192 10/12/2016   LYMPHOPCT 29 04/25/2016   MONOPCT 10 04/25/2016   EOSPCT 3 04/25/2016   BASOPCT 1 04/25/2016    CMP: Lab Results  Component Value Date   NA 135 10/10/2016   K 4.0 10/10/2016   CL 96 (L) 10/10/2016   CO2 25 10/10/2016   BUN 57 (H) 10/10/2016   CREATININE 6.53 (H) 10/10/2016   CREATININE 3.50 (H) 04/25/2016   PROT 6.4 (L) 11/26/2015   ALBUMIN 3.3 (L) 12/10/2015   BILITOT 0.4 11/26/2015   ALKPHOS 54 11/26/2015   AST 16 11/26/2015   ALT 14 11/26/2015  . Lab Results  Component Value Date     INR 1.29 10/12/2016   INR 1.18 10/11/2016   INR 1.17 10/10/2016     Total Time in preparing paper work, data evaluation and todays exam - 51 minutes  Lala Lund M.D on 10/12/2016 at 9:28 AM  [facility]   Office  [phone]

## 2016-10-12 NOTE — Progress Notes (Signed)
Progress Note  Patient Name: Briana [name] Date of Encounter: 10/12/2016  Primary Cardiologist: Bronson Ing  Subjective   Feels well. Denies fatigue, syncope or presyncope. Continues to have moderate bradycardia down to the high 30s while asleep. While awake heart rate is in the high 50s-80s. Current rhythm appears to be atrial flutter. Atrial fibrillation has been also well documented.  Inpatient Medications    Scheduled Meds: . amLODipine  5 mg Oral Daily  . aspirin EC  81 mg Oral Daily  . atorvastatin  80 mg Oral Q48H  . diclofenac  1 patch Transdermal BID  . enoxaparin (LOVENOX) injection  1 mg/kg Subcutaneous Q24H  . insulin aspart  0-9 Units Subcutaneous TID WC  . insulin aspart protamine- aspart  20 Units Subcutaneous BID WC  . loratadine  10 mg Oral Daily  . rOPINIRole  0.25 mg Oral QHS  . sertraline  200 mg Oral Daily  . sevelamer carbonate  1,600 mg Oral TID WC  . warfarin  5 mg Oral ONCE-1800  . Warfarin - Pharmacist Dosing Inpatient   Does not apply q1800   Continuous Infusions:  PRN Meds: acetaminophen **OR** acetaminophen, diazepam, HYDROcodone-acetaminophen, nitroGLYCERIN, ondansetron **OR** ondansetron (ZOFRAN) IV, polyethylene glycol, zolpidem   Vital Signs    Vitals:   10/11/16 2059 10/12/16 0022 10/12/16 0428 10/12/16 0745  BP: (!) 123/50 (!) 125/38 (!) 155/40 (!) 124/39  Pulse: 72 (!) 47 (!) 52 100  Resp: 19 20 (!) 22 20  Temp: 98.7 F (37.1 C) 98.2 F (36.8 C) 98.3 F (36.8 C) 97.6 F (36.4 C)  TempSrc: Oral Oral Oral Oral  SpO2: 100% 100%  98%  Weight:   214 lb 3.2 oz (97.2 kg)   Height:        Intake/Output Summary (Last 24 hours) at 10/12/16 0833 Last data filed at 10/11/16 2215  Gross per 24 hour  Intake              200 ml  Output                0 ml  Net              200 ml   Filed Weights   10/10/16 2008 10/11/16 0508 10/12/16 0428  Weight: 210 lb 8.6 oz (95.5 kg) 211 lb 4.8 oz (95.8 kg) 214 lb 3.2 oz (97.2 kg)     Telemetry    Atrial flutter with variable AV block - Personally Reviewed  ECG    She'll flutter with variable AV block, right bundle branch block - Personally Reviewed  Physical Exam  Smiling, comfortable GEN: No acute distress.   Neck: No JVD Cardiac:  irregular rhythm, split second heart sound, 2/6 systolic murmur, no diastolic murmurs, rubs, or gallops.  Respiratory: Clear to auscultation bilaterally. GI: Soft, nontender, non-distended  MS: No edema; No deformity. Neuro:  Nonfocal  Psych: Normal affect   Labs    Chemistry Recent Labs Lab 10/09/16 1520 10/10/16 0341  NA 138 135  K 4.5 4.0  CL 99* 96*  CO2 24 25  GLUCOSE 121* 189*  BUN 44* 57*  CREATININE 5.76* 6.53*  CALCIUM 9.0 8.5*  GFRNONAA 7* 6*  GFRAA 8* 7*  ANIONGAP 15 14     Hematology Recent Labs Lab 10/09/16 2055 10/10/16 0341  WBC 8.4 8.5  RBC 3.55* 3.67*  HGB 10.6* 11.1*  HCT 34.3* 35.2*  MCV 96.6 95.9  MCH 29.9 30.2  MCHC 30.9 31.5  RDW 15.2 15.3  PLT 162 171    Cardiac Enzymes Recent Labs Lab 10/10/16 0001 10/10/16 0341 10/10/16 1002  TROPONINI <0.03 <0.03 <0.03    Recent Labs Lab 10/09/16 1525  TROPIPOC 0.03     Patient Profile     71 y.o. female with end-stage renal disease on chronic hemodialysis, with recent fatigue related to bradycardia. She has had numerous types of atrial arrhythmias and currently appears to be in atrial flutter with slow ventricular response. Evaluated by electrophysiology. Beta blocker stopped. Felt to be a poor candidate for pacemaker therapy due to end-stage renal disease. Medical management recommended.  Assessment & Plan    Avoid all negative chronotropic agents including beta blockers, diltiazem, verapamil, clonidine, etc. INR is subtherapeutic and warfarin dose to be adjusted. Follow-up with Dr. Bronson Ing in [place]. For discharge after hemodialysis today.  Signed, Sanda Klein, MD  10/12/2016, 8:33 AM

## 2016-10-12 NOTE — Care Management Note (Signed)
Case Management Note  Patient Details  Name: Briana [name] MRN: [id number] Date of Birth: 06-15-1945  Subjective/Objective:                 Spoke with patient over the phone she would like to use [facility] for [facility] referral made to to [facility] clinical liaison for [facility] PT RN. Denies needs for DME. Verified that patient's Pharmacy has Lovenox in stock, cost is $11.40, closes at 2pm. Patient stated that she will call her daughter to pick it up prior to 2pm.    Action/Plan:  DC to home w [facility] Expected Discharge Date:  10/12/16               Expected Discharge Plan:  [place]  In-House Referral:     Discharge planning Services  CM Consult  Post Acute Care Choice:  Home Health Choice offered to:  Patient  DME Arranged:    DME Agency:     HH Arranged:  RN, PT [place] Agency:  [place]  Status of Service:  Completed, signed off  If discussed at [place] of Stay Meetings, dates discussed:    Additional Comments:  Carles Collet, RN 10/12/2016, 10:14 AM

## 2016-10-12 NOTE — Progress Notes (Signed)
Subjective:   No new c/o's- cardiology has signed off on her discharge today - needs dialysis before discharge Objective Vital signs in last 24 hours: Vitals:   10/11/16 2059 10/12/16 0022 10/12/16 0428 10/12/16 0745  BP: (!) 123/50 (!) 125/38 (!) 155/40 (!) 124/39  Pulse: 72 (!) 47 (!) 52 100  Resp: 19 20 (!) 22 20  Temp: 98.7 F (37.1 C) 98.2 F (36.8 C) 98.3 F (36.8 C) 97.6 F (36.4 C)  TempSrc: Oral Oral Oral Oral  SpO2: 100% 100%  98%  Weight:   97.2 kg (214 lb 3.2 oz)   Height:       Weight change: -1.539 kg (-3 lb 6.3 oz)  Intake/Output Summary (Last 24 hours) at 10/12/16 0858 Last data filed at 10/11/16 2215  Gross per 24 hour  Intake              200 ml  Output                0 ml  Net              200 ml   Dialyzes at [facility]  TTS- EDW 97 kg . HD Bath 2/2.5, Dialyzer 180, Heparin none. Access left upper arm AV fistula status post intervention on 5/14.  She receives no medications with dialysis. On Tuesday her post treatment weight was 96.6.   Assessment/Plan: 71 year old white female with multiple medical problems including ESRD. She is hospitalized with bradycardia 1 bradycardia- beta blocker has been stopped. We are awaiting a cardiology follow up. Patient is asymptomatic. Her potassium is within normal limits, her magnesium is slightly high.  Plan is for follow up with her local cardiologist 2 ESRD: Normally TTS at [facility]. We did her routine dialysis 5/17 via AV fistula. got to her dry weight and did her on a slightly higher potassium bath so her potassium does not get too low.  Routine HD today followed by discharge 3 Hypertension: Blood pressure seems normal/low. Metoprolol been discontinued.  she is on full dose Norvasc, I have decreased to 5 mg daily 4. Anemia of ESRD: Apparently not on any ESA as outpatient. Hemoglobin is above 10. We will continue to hold ESA 5. Metabolic Bone Disease: Has on her medication list PhosLo, Auryxia, and  Renvela- we'll keep her on only Renvela for now- she does not need all 3     Burtis Imhoff A    Labs: Basic Metabolic Panel:  Recent Labs Lab 10/09/16 1520 10/10/16 0341  NA 138 135  K 4.5 4.0  CL 99* 96*  CO2 24 25  GLUCOSE 121* 189*  BUN 44* 57*  CREATININE 5.76* 6.53*  CALCIUM 9.0 8.5*   Liver Function Tests: No results for input(s): AST, ALT, ALKPHOS, BILITOT, PROT, ALBUMIN in the last 168 hours. No results for input(s): LIPASE, AMYLASE in the last 168 hours. No results for input(s): AMMONIA in the last 168 hours. CBC:  Recent Labs Lab 10/09/16 2055 10/10/16 0341  WBC 8.4 8.5  HGB 10.6* 11.1*  HCT 34.3* 35.2*  MCV 96.6 95.9  PLT 162 171   Cardiac Enzymes:  Recent Labs Lab 10/10/16 0001 10/10/16 0341 10/10/16 1002  TROPONINI <0.03 <0.03 <0.03   CBG:  Recent Labs Lab 10/11/16 0729 10/11/16 1203 10/11/16 1609 10/11/16 2059 10/12/16 0743  GLUCAP 226* 247* 223* 141* 185*    Iron Studies: No results for input(s): IRON, TIBC, TRANSFERRIN, FERRITIN in the last 72 hours. Studies/Results: No results found. Medications: Infusions:  Scheduled Medications: . amLODipine  5 mg Oral Daily  . aspirin EC  81 mg Oral Daily  . atorvastatin  80 mg Oral Q48H  . diclofenac  1 patch Transdermal BID  . enoxaparin (LOVENOX) injection  1 mg/kg Subcutaneous Q24H  . insulin aspart  0-9 Units Subcutaneous TID WC  . insulin aspart protamine- aspart  20 Units Subcutaneous BID WC  . loratadine  10 mg Oral Daily  . rOPINIRole  0.25 mg Oral QHS  . sertraline  200 mg Oral Daily  . sevelamer carbonate  1,600 mg Oral TID WC  . warfarin  5 mg Oral ONCE-1800  . Warfarin - Pharmacist Dosing Inpatient   Does not apply q1800    have reviewed scheduled and prn medications.  Physical Exam: General: NAD- obese Heart: brady, irreg Lungs: mostly clear Abdomen: obese, soft, non tender Extremities: no peripheral edema Dialysis Access: left upper AVF- lots of  bruising     10/12/2016,8:58 AM  LOS: 2 days

## 2016-10-12 NOTE — Plan of Care (Signed)
Problem: Education: Goal: Knowledge of [facility] Education information/materials will improve Outcome: Progressing Patient aware of plan of care.  RN provided medication education on all medications administered thus far this shift prior to administration.  Patient stated understanding.     

## 2016-10-12 NOTE — Progress Notes (Signed)
ANTICOAGULATION CONSULT NOTE - [place] for warfarin Indication: atrial fibrillation  Allergies  Allergen Reactions  . Oxycodone Other (See Comments)    Hallucinations   . Clonazepam Other (See Comments)    Sleepy   . Gemfibrozil Other (See Comments)    Patient is not aware of this allergy but states that she had side effects to a cholesterol medication in the past  . Tape Other (See Comments)    TAPE WILL CAUSE BLISTERS/SORES; PLEASE USE COBAN WRAP!!  . Carbamazepine Other (See Comments)    Reaction to tegretol - "loopy"  . Macrobid [Nitrofurantoin] Nausea And Vomiting    Patient Measurements: Height: 5\' 4"  (162.6 cm) Weight: 214 lb 3.2 oz (97.2 kg) IBW/kg (Calculated) : 54.7  Vital Signs: Temp: 97.6 F (36.4 C) (05/19 0745) Temp Source: Oral (05/19 0745) BP: 124/39 (05/19 0745) Pulse Rate: 100 (05/19 0745)  Labs:  Recent Labs  10/09/16 1520 10/09/16 2055 10/10/16 0001 10/10/16 0341 10/10/16 1002 10/11/16 0446 10/12/16 0302  HGB  --  10.6*  --  11.1*  --   --   --   HCT  --  34.3*  --  35.2*  --   --   --   PLT  --  162  --  171  --   --   --   LABPROT  --   --  15.0  --   --  15.1 16.2*  INR  --   --  1.17  --   --  1.18 1.29  CREATININE 5.76*  --   --  6.53*  --   --   --   TROPONINI  --   --  <0.03 <0.03 <0.03  --   --     Estimated Creatinine Clearance: 8.9 mL/min (A) (by C-G formula based on SCr of 6.53 mg/dL (H)).   Medications:  PTA warfarin dose: 5mg  daily (last dose 10/08/16)  Assessment: 71 yo F with history of afib on warfarin PTA. Also with hx DVT noted. Pharmacy consulted to dose warfarin inpatient. Per EDP note, pt reports large bruise from dialysis on L upper arm last week and was off warfarin.  Pt states she has been back on her warfarin the past 3 days PTA.   INR remains subtherapeutic at 1.29 after 2 doses of 7.5 mg. CBC stable and no overt signs of bleeding noted in chart. Patient is also on Lovenox for  bridging while INR subtherapeutic.   Goal of Therapy:  INR 2-3 Anti-Xa level 0.6-1 units/ml 4hrs after LMWH dose given Monitor platelets by anticoagulation protocol: Yes   Plan:  Daily INR Resume home dosing with warfarin 5 mg x 1 tonight Continue Lovenox 95 mg SQ q24h  Uvaldo Bristle, PharmD PGY1 Pharmacy Resident Pager: [phone] 10/12/2016 7:49 AM

## 2016-10-15 ENCOUNTER — Encounter: Payer: Self-pay | Admitting: Physician Assistant

## 2016-10-15 ENCOUNTER — Ambulatory Visit: Payer: Medicare Other | Admitting: Vascular Surgery

## 2016-10-15 DIAGNOSIS — I495 Sick sinus syndrome: Secondary | ICD-10-CM | POA: Insufficient documentation

## 2016-10-15 DIAGNOSIS — I4892 Unspecified atrial flutter: Secondary | ICD-10-CM | POA: Insufficient documentation

## 2016-10-15 NOTE — Progress Notes (Signed)
Cardiology Office Note    Date:  10/16/2016  ID:  Briana [name], DOB July 23, 1945, MRN [id number] PCP:  Antionette Fairy, PA-C  Cardiologist: Dr. Billie Lade  Chief Complaint: f/u slow heart rate  History of Present Illness:  Briana [name] is a 71 y.o. female with history of paroxysmal atrial fib, paroxysmal atrial flutter, tachycardia, bradycardia, CAD s/p CABG 2005, carotid disease s/p R CEA 2011 (followed by vascular), ESRD on HD, depression/anxiety, DM, HTN, HLD, PAD, rheumatic fever, sleep apnea, chronic diastolic CHF, pulm HTN, chronic appearing anemia who presents for f/u of bradycardia.  To recap history, she underwent CABG on 05/23/2004 with LIMA to LAD, SVG to OM 2, and SVG to distal RCA. She was hospitalized in 0/1655 for a/c diastolic CHF, PAF, and worsening CKD. Nuc 09/2015 showed concern for mild peri-infarct ischemia involving the anterior wall and anteroseptal wall; evidence for infarcts involving the anterior and lateral walls, EF 45%. Cath was deferred given her CKD (she had not yet progressed to ESRD until later in the summer). This has since been managed medically in absence of anginal symptoms, with cautious continuation of aspirin per primary cardiologist. Echo 10/02/15 showed normal LV systolic function, EF 37-48%, grade 3 diastolic dysfunction with high filling pressures, mild AI, mild MR, moderate TR, severe pulmonary HTN (69 mmHg). She underwent TEE/DCCV 10/09/15. At some point during a hospitalization in June, her metoprolol was discontinued due to bradycardia. She was discharged on amiodarone 228m. Subsequent to this in late 2017 she was followed for tachycardia/atrial flutter. She was seen by Dr. TLovena Le12/2017 who felt she should stop amiodarone and pursue a strategy of rate control. More recently she was seen by Dr. KBronson Ing5/11/18 for an add on visit noted at dialysis to have HR 40's. Metoprolol was reduced to 12.522mBID then with plans for OP Holter.   On 10/09/16 she was being seen at a routine PMD visit when her HR was noted to be 30 - she was c/o fatigue but no dizziness, no reports of near syncope or syncope. She was admitted for further monitoring. Pacemaker was deferred due to minimally symptomatic nature and risk of infection given ESRD. Her metoprolol was completely discontinued with telemetry showing "moderate bradycardia down to the high 30s while asleep. While awake heart rate is in the high 50s-80s. Current rhythm appears to be atrial flutter. Atrial fibrillation has been also well documented." Most recent labs showed Hgb 11.9, troponin negative, TSH wnl.  She presents back for follow-up with her daughter. Overall she feels she is stable since discharge. She does have days where she feels more tired than others. She's not had any dizziness or syncope. She has not had any chest pain. She walks with a cane. She is quite sedentary in general. She is extremely appreciative of the support that home health has provided. Her Lovenox/Coumadin is being followed by coumadin clinic.   Past Medical History:  Diagnosis Date  . Adenomatous colon polyp 09/2007  . Arthritis   . Atrial fibrillation with rapid ventricular response ([facility]/03/2016  . Bradycardia   . CAD (coronary artery disease)    a. s/p CABG 2005.  . Carotid artery disease ([facility]   a. s/p R CEA 2011. Followed by vascular -  duplex 04/16/16: <4<27%ICA, 4007-86%ICA.  . Marland Kitchenhronic anemia   . Chronic diastolic CHF (congestive heart failure) ([facility]  . Depression with anxiety   . Diabetes mellitus   . ESRD (end stage renal  disease) ([place])    T Th Sat  . Heart murmur   . Hyperlipidemia   . Hypertension   . Neuropathy   . PAD (peripheral artery disease) ([place])   . Paroxysmal atrial flutter ([place])   . Peripheral vascular disease ([place])   . Pulmonary hypertension ([place])   . Rheumatic fever   . Sleep apnea     Past Surgical History:  Procedure Laterality Date  . BASCILIC VEIN  TRANSPOSITION Left 12/04/2015   Procedure: BASILIC VEIN TRANSPOSITION;  Surgeon: Rosetta Posner, MD;  Location: [facility];  Service: Vascular;  Laterality: Left;  . CAROTID ENDARTERECTOMY  10/04/2009   Right CEA  . CORONARY ARTERY BYPASS GRAFT  2005  . PERIPHERAL VASCULAR CATHETERIZATION N/A 06/14/2016   Procedure: A/V Fistulagram - Left Upper;  Surgeon: Elam Dutch, MD;  Location: [facility];  Service: Cardiovascular;  Laterality: N/A;  . POLYPECTOMY  02/25/2012   Procedure: POLYPECTOMY;  Surgeon: Danie Binder, MD;  Location: [facility];  Service: Endoscopy;  Laterality: N/A;  . TUBAL LIGATION      Current Medications: Outpatient Medications Prior to Visit  Medication Sig Dispense Refill  . amLODipine (NORVASC) 10 MG tablet Take 10 mg by mouth daily.    Marland Kitchen aspirin EC 81 MG EC tablet Take 1 tablet (81 mg total) by mouth daily. 30 tablet 1  . atorvastatin (LIPITOR) 80 MG tablet Take 1 tablet (80 mg total) by mouth daily. (Patient taking differently: Take 80 mg by mouth every other day. ) 30 tablet 3  . blood glucose meter kit and supplies KIT Dispense based on patient and insurance preference. Use up to four times daily as directed. (FOR ICD-10 E11.65) 1 each 0  . calcium acetate (PHOSLO) 667 MG capsule Take 2 capsules (1,334 mg total) by mouth 3 (three) times daily with meals. (Patient taking differently: Take 667 mg by mouth 3 (three) times daily with meals. ) 180 capsule 1  . cetirizine (ZYRTEC) 10 MG tablet Take 10 mg by mouth daily.     . diazepam (VALIUM) 5 MG tablet Take 5 mg by mouth 3 (three) times daily as needed for anxiety.     . diclofenac (FLECTOR) 1.3 % PTCH Place 1 patch onto the skin 2 (two) times daily. 3 patch 0  . enoxaparin (LOVENOX) 100 MG/ML injection Inject 1 mL (100 mg total) into the skin daily. 7 Syringe 0  . ferric citrate (AURYXIA) 1 GM 210 MG(Fe) tablet Take 210 mg by mouth 3 (three) times daily with meals.     Marland Kitchen HYDROcodone-acetaminophen (NORCO/VICODIN) 5-325 MG  tablet Take 1 tablet by mouth every 6 (six) hours as needed for moderate pain (Must last 30 days.Do not take and drive a car or use machinery.). 100 tablet 0  . Insulin Lispro Prot & Lispro (HUMALOG MIX 75/25 KWIKPEN) (75-25) 100 UNIT/ML Kwikpen Used 35 units with breakfast and 30 units with supper when pre-meal blood glucose is above 90 mg/dL. (Patient taking differently: Inject 30 Units into the skin 2 (two) times daily before a meal. ) 5 pen 2  . nitroGLYCERIN (NITROSTAT) 0.4 MG SL tablet Place 1 tablet (0.4 mg total) under the tongue every 5 (five) minutes as needed for chest pain. Max three pills 45 tablet 0  . polyethylene glycol (MIRALAX / GLYCOLAX) packet Take 17 g by mouth 2 (two) times daily. (Patient taking differently: Take 17 g by mouth daily as needed for mild constipation. ) 14 each 0  . protein  supplement (RESOURCE BENEPROTEIN) POWD Take 6 g by mouth 3 (three) times daily with meals.  0  . rOPINIRole (REQUIP) 0.25 MG tablet Take 1 tablet (0.25 mg total) by mouth at bedtime. 30 tablet 3  . sertraline (ZOLOFT) 100 MG tablet Take 200 mg by mouth daily.    . sevelamer carbonate (RENVELA) 800 MG tablet Take 800 mg by mouth 3 (three) times daily with meals.    . Vitamin D, Ergocalciferol, (DRISDOL) 50000 units CAPS capsule Take 50,000 Units by mouth every 7 (seven) days.    Marland Kitchen warfarin (COUMADIN) 5 MG tablet TAKE AS DIRECTED (Patient taking differently: Take 5 mg by mouth once a day at 6 PM) 90 tablet 3   No facility-administered medications prior to visit.      Allergies:   Oxycodone; Clonazepam; Gemfibrozil; Tape; Carbamazepine; and Macrobid [nitrofurantoin]   Social History   Social History  . Marital status: Married    Spouse name: N/A  . Number of children: N/A  . Years of education: N/A   Social History Main Topics  . Smoking status: Never Smoker  . Smokeless tobacco: Never Used  . Alcohol use No  . Drug use: No  . Sexual activity: Not Asked   Other Topics Concern  .  None   Social History Narrative  . None     Family History:  Family History  Problem Relation Age of Onset  . Breast cancer Mother   . Heart disease Father   . Diabetes Father   . Heart disease Brother     ROS:   Please see the history of present illness. + constipation - discussed low fiber intake All other systems are reviewed and otherwise negative.    PHYSICAL EXAM:   VS:  BP (!) 122/50   Pulse 86   Ht '5\' 4"'  (1.626 m)   Wt 215 lb (97.5 kg)   SpO2 97%   BMI 36.90 kg/m   BMI: Body mass index is 36.9 kg/m. GEN: Well nourished obese WF in no acute distress  HEENT: normocephalic, atraumatic Neck: no JVD, carotid bruits, or masses Cardiac: irregularly irregular rhythm, controlled rate, no murmurs, rubs, or gallops, no edema  Respiratory:  clear to auscultation bilaterally, normal work of breathing GI: soft, nontender, nondistended, + BS MS: no deformity or atrophy  Skin: warm and dry, no rash, left sided dialysis graft with significant ecchymosis which patient states dialysis center is aware of, and has actually improved significantly Neuro:  Alert and Oriented x 3, Strength and sensation are intact, follows commands Psych: euthymic mood, full affect  Wt Readings from Last 3 Encounters:  10/16/16 215 lb (97.5 kg)  10/12/16 211 lb 6.7 oz (95.9 kg)  10/04/16 218 lb (98.9 kg)      Studies/Labs Reviewed:   EKG:  EKG was ordered today and personally reviewed by me and demonstrates atrial fib versus flutter 73bpm, nonspecific St-T changes, RBBB with LAFB pattern  Recent Labs: 11/10/2015: B Natriuretic Peptide 668.4 11/26/2015: ALT 14 10/09/2016: Magnesium 2.7; TSH 2.454 10/12/2016: BUN 51; Creatinine, Ser 6.28; Hemoglobin 11.9; Platelets 192; Potassium 4.0; Sodium 131   Lipid Panel    Component Value Date/Time   CHOL  04/30/2009 0323    142        ATP III CLASSIFICATION:  <200     mg/dL   Desirable  200-239  mg/dL   Borderline High  >=240    mg/dL   High  TRIG 151 (H) 04/30/2009 0323   HDL 41 04/30/2009 0323   CHOLHDL 3.5 04/30/2009 0323   VLDL 30 04/30/2009 0323   LDLCALC  04/30/2009 0323    71        Total Cholesterol/HDL:CHD Risk Coronary Heart Disease Risk Table                     Men   Women  1/2 Average Risk   3.4   3.3  Average Risk       5.0   4.4  2 X Average Risk   9.6   7.1  3 X Average Risk  23.4   11.0        Use the calculated Patient Ratio above and the CHD Risk Table to determine the patient's CHD Risk.        ATP III CLASSIFICATION (LDL):  <100     mg/dL   Optimal  100-129  mg/dL   Near or Above                    Optimal  130-159  mg/dL   Borderline  160-189  mg/dL   High  >190     mg/dL   Very High    Additional studies/ records that were reviewed today include: Summarized above.    ASSESSMENT & PLAN:   1. Tachy/brady syndrome - per recent hospital admission with EP input, patient is poor candidate for pacemaker given infection risk with ongoing dialysis. During recent hospital stay her bradycardia was primarily nocturnal. She does complain of occasional fatigue but this is likely multifactorial in the setting of numerous comorbidities. She's not had any interim syncope. Continue to monitor conservatively for now.  2. PAF/paroxysmal atrial flutter - remains in atrial flutter today with rate control strategy only. She is off all AVN blocking agents. Coumadin followed by Coumadin clinic. 3. CAD s/p CABG - abnormal stress test previously as above, but medical therapy pursued. She's not had any interim angina. Continue surveillance. 4. Chronic diastolic CHF - volume status managed with HD. 5. ESRD on HD - she does report that occasionally her BP drops with dialysis. Manual check by me today was 122/50. She has amlodipine listed but she does not think she is taking this. She will check at home and call us with update. If she is taking it, would recommend only taking 1/2 dose on dialysis days. If she is not taking  it at all, would remain off. Can consider midodrine in the future in dialysis days if hypotension becomes a routine problem.  Disposition: F/u with Dr. Bronson Ing in 2 months. The patient had a previously scheduled appointment in June with Dr. Lovena Le. She does not feel they had a good therapeutic relationship and does not wish to see him. Given recent EP input, not sure there would be anything to add at this time. Will cancel this appointment per his request and arrange f/u as above with Dr. Raliegh Ip.   Medication Adjustments/Labs and Tests Ordered: Current medicines are reviewed at length with the patient today.  Concerns regarding medicines are outlined above. Medication changes, Labs and Tests ordered today are summarized above and listed in the Patient Instructions accessible in Encounters.   Signed, Charlie Pitter, PA-C  10/16/2016 4:13 PM    [facility] Location in [place]. [place], [place] [address] Ph: [phone]; Fax [phone]

## 2016-10-16 ENCOUNTER — Ambulatory Visit (INDEPENDENT_AMBULATORY_CARE_PROVIDER_SITE_OTHER): Payer: Medicare Other | Admitting: Physician Assistant

## 2016-10-16 ENCOUNTER — Telehealth: Payer: Self-pay | Admitting: *Deleted

## 2016-10-16 ENCOUNTER — Encounter: Payer: Self-pay | Admitting: Physician Assistant

## 2016-10-16 ENCOUNTER — Ambulatory Visit (INDEPENDENT_AMBULATORY_CARE_PROVIDER_SITE_OTHER): Payer: Medicare Other | Admitting: *Deleted

## 2016-10-16 VITALS — BP 122/50 | HR 86 | Ht 64.0 in | Wt 215.0 lb

## 2016-10-16 DIAGNOSIS — I480 Paroxysmal atrial fibrillation: Secondary | ICD-10-CM

## 2016-10-16 DIAGNOSIS — I495 Sick sinus syndrome: Secondary | ICD-10-CM | POA: Diagnosis not present

## 2016-10-16 DIAGNOSIS — I2510 Atherosclerotic heart disease of native coronary artery without angina pectoris: Secondary | ICD-10-CM | POA: Diagnosis not present

## 2016-10-16 DIAGNOSIS — Z992 Dependence on renal dialysis: Secondary | ICD-10-CM | POA: Diagnosis not present

## 2016-10-16 DIAGNOSIS — I5032 Chronic diastolic (congestive) heart failure: Secondary | ICD-10-CM

## 2016-10-16 DIAGNOSIS — I4892 Unspecified atrial flutter: Secondary | ICD-10-CM

## 2016-10-16 DIAGNOSIS — N186 End stage renal disease: Secondary | ICD-10-CM

## 2016-10-16 LAB — POCT INR: INR: 1.2

## 2016-10-16 NOTE — Telephone Encounter (Signed)
INR 1.2 / please call w/ instructions / tg

## 2016-10-16 NOTE — Patient Instructions (Signed)
Your physician recommends that you schedule a follow-up appointment in:  8 Weeks with Dr. Bronson Ing   Your physician recommends that you continue on your current medications as directed. Please refer to the Current Medication list given to you today.  PLEASE CALL us TO CLARIFY WHETHER YOU ARE TAKING AMLODIPINE ([place]) [phone].  If you need a refill on your cardiac medications before your next appointment, please call your pharmacy.  Thank you for choosing [facility]!

## 2016-10-16 NOTE — Telephone Encounter (Signed)
Done.  See coumadin note. 

## 2016-10-18 ENCOUNTER — Telehealth: Payer: Self-pay | Admitting: Cardiology

## 2016-10-18 ENCOUNTER — Ambulatory Visit (INDEPENDENT_AMBULATORY_CARE_PROVIDER_SITE_OTHER): Payer: Self-pay | Admitting: Internal Medicine

## 2016-10-18 DIAGNOSIS — R0989 Other specified symptoms and signs involving the circulatory and respiratory systems: Secondary | ICD-10-CM

## 2016-10-18 LAB — POCT INR: INR: 1.3

## 2016-10-18 NOTE — Telephone Encounter (Signed)
Spoke with Briana [name] with [facility], she wanted to inform Dr. Bronson Ing that the pt was @ dialysis yesterday and they had to stop an hour early as the pt's heart rate dropped in the 30's. She refused for them to call ambulance because she was asymptomatic. Today her heart rate is 58 and her blood pressure is 128/76, she continues to be asymptomatic. She also wanted to advise [place] that she is not taking amlodipine 10 mg any longer.

## 2016-10-18 NOTE — Telephone Encounter (Signed)
[name] w/ [facility] is asking for return phone call regarding patient's heart rate / tg

## 2016-10-18 NOTE — Telephone Encounter (Signed)
As she is asymptomatic with this, HR will continue to be monitored as per EP recommendations (See Dr. Macky Lower consult note).

## 2016-10-22 ENCOUNTER — Ambulatory Visit (INDEPENDENT_AMBULATORY_CARE_PROVIDER_SITE_OTHER): Payer: Medicare Other | Admitting: *Deleted

## 2016-10-22 LAB — POCT INR: INR: 1.3

## 2016-10-24 ENCOUNTER — Ambulatory Visit (INDEPENDENT_AMBULATORY_CARE_PROVIDER_SITE_OTHER): Payer: Medicare Other | Admitting: *Deleted

## 2016-10-24 LAB — POCT INR: INR: 1.8

## 2016-10-28 ENCOUNTER — Ambulatory Visit (INDEPENDENT_AMBULATORY_CARE_PROVIDER_SITE_OTHER): Payer: Medicare Other | Admitting: *Deleted

## 2016-10-28 LAB — POCT INR: INR: 2

## 2016-10-29 ENCOUNTER — Telehealth: Payer: Self-pay

## 2016-10-29 ENCOUNTER — Other Ambulatory Visit: Payer: Self-pay

## 2016-10-29 NOTE — Telephone Encounter (Signed)
Recieved phone message from jerry from wound care, sending over medical necessity forms for O2 therapy

## 2016-11-04 ENCOUNTER — Ambulatory Visit (INDEPENDENT_AMBULATORY_CARE_PROVIDER_SITE_OTHER): Payer: Medicare Other | Admitting: *Deleted

## 2016-11-04 LAB — POCT INR: INR: 2.7

## 2016-11-05 ENCOUNTER — Encounter (HOSPITAL_COMMUNITY): Payer: Self-pay

## 2016-11-05 ENCOUNTER — Ambulatory Visit: Payer: Medicare Other | Admitting: Cardiovascular Disease

## 2016-11-05 ENCOUNTER — Emergency Department (HOSPITAL_COMMUNITY): Payer: Medicare Other

## 2016-11-05 ENCOUNTER — Inpatient Hospital Stay (HOSPITAL_COMMUNITY)
Admission: EM | Admit: 2016-11-05 | Discharge: 2016-11-08 | DRG: 242 | Disposition: A | Discharge status: Home / Self Care | Payer: Medicare Other | Attending: Internal Medicine | Admitting: Internal Medicine

## 2016-11-05 DIAGNOSIS — I2510 Atherosclerotic heart disease of native coronary artery without angina pectoris: Secondary | ICD-10-CM | POA: Diagnosis present

## 2016-11-05 DIAGNOSIS — Z79899 Other long term (current) drug therapy: Secondary | ICD-10-CM

## 2016-11-05 DIAGNOSIS — Z7901 Long term (current) use of anticoagulants: Secondary | ICD-10-CM | POA: Diagnosis not present

## 2016-11-05 DIAGNOSIS — N186 End stage renal disease: Secondary | ICD-10-CM | POA: Diagnosis present

## 2016-11-05 DIAGNOSIS — E1122 Type 2 diabetes mellitus with diabetic chronic kidney disease: Secondary | ICD-10-CM | POA: Diagnosis present

## 2016-11-05 DIAGNOSIS — R509 Fever, unspecified: Secondary | ICD-10-CM | POA: Diagnosis present

## 2016-11-05 DIAGNOSIS — R001 Bradycardia, unspecified: Secondary | ICD-10-CM | POA: Diagnosis present

## 2016-11-05 DIAGNOSIS — F418 Other specified anxiety disorders: Secondary | ICD-10-CM | POA: Diagnosis present

## 2016-11-05 DIAGNOSIS — I272 Pulmonary hypertension, unspecified: Secondary | ICD-10-CM | POA: Diagnosis present

## 2016-11-05 DIAGNOSIS — E1151 Type 2 diabetes mellitus with diabetic peripheral angiopathy without gangrene: Secondary | ICD-10-CM | POA: Diagnosis present

## 2016-11-05 DIAGNOSIS — Z950 Presence of cardiac pacemaker: Secondary | ICD-10-CM

## 2016-11-05 DIAGNOSIS — Z7982 Long term (current) use of aspirin: Secondary | ICD-10-CM

## 2016-11-05 DIAGNOSIS — Z888 Allergy status to other drugs, medicaments and biological substances status: Secondary | ICD-10-CM

## 2016-11-05 DIAGNOSIS — I5032 Chronic diastolic (congestive) heart failure: Secondary | ICD-10-CM | POA: Diagnosis present

## 2016-11-05 DIAGNOSIS — Z794 Long term (current) use of insulin: Secondary | ICD-10-CM | POA: Diagnosis not present

## 2016-11-05 DIAGNOSIS — I132 Hypertensive heart and chronic kidney disease with heart failure and with stage 5 chronic kidney disease, or end stage renal disease: Secondary | ICD-10-CM | POA: Diagnosis present

## 2016-11-05 DIAGNOSIS — I442 Atrioventricular block, complete: Secondary | ICD-10-CM | POA: Diagnosis present

## 2016-11-05 DIAGNOSIS — Z951 Presence of aortocoronary bypass graft: Secondary | ICD-10-CM

## 2016-11-05 DIAGNOSIS — Z885 Allergy status to narcotic agent status: Secondary | ICD-10-CM | POA: Diagnosis not present

## 2016-11-05 DIAGNOSIS — I482 Chronic atrial fibrillation: Secondary | ICD-10-CM | POA: Diagnosis present

## 2016-11-05 DIAGNOSIS — E785 Hyperlipidemia, unspecified: Secondary | ICD-10-CM | POA: Diagnosis present

## 2016-11-05 DIAGNOSIS — I481 Persistent atrial fibrillation: Secondary | ICD-10-CM

## 2016-11-05 LAB — CBC
HCT: 36.2 % (ref 36.0–46.0)
HEMOGLOBIN: 11.1 g/dL — AB (ref 12.0–15.0)
MCH: 29.3 pg (ref 26.0–34.0)
MCHC: 30.7 g/dL (ref 30.0–36.0)
MCV: 95.5 fL (ref 78.0–100.0)
PLATELETS: 191 10*3/uL (ref 150–400)
RBC: 3.79 MIL/uL — AB (ref 3.87–5.11)
RDW: 15.2 % (ref 11.5–15.5)
WBC: 9.5 10*3/uL (ref 4.0–10.5)

## 2016-11-05 LAB — BASIC METABOLIC PANEL
ANION GAP: 13 (ref 5–15)
BUN: 58 mg/dL — ABNORMAL HIGH (ref 6–20)
CHLORIDE: 96 mmol/L — AB (ref 101–111)
CO2: 24 mmol/L (ref 22–32)
CREATININE: 5.67 mg/dL — AB (ref 0.44–1.00)
Calcium: 9.1 mg/dL (ref 8.9–10.3)
GFR calc non Af Amer: 7 mL/min — ABNORMAL LOW (ref >60)
GFR, EST AFRICAN AMERICAN: 8 mL/min — AB (ref >60)
Glucose, Bld: 238 mg/dL — ABNORMAL HIGH (ref 65–99)
POTASSIUM: 3.8 mmol/L (ref 3.5–5.1)
SODIUM: 133 mmol/L — AB (ref 135–145)

## 2016-11-05 LAB — I-STAT TROPONIN, ED: Troponin i, poc: 0.05 ng/mL (ref 0.00–0.08)

## 2016-11-05 LAB — GLUCOSE, CAPILLARY
Glucose-Capillary: 239 mg/dL — ABNORMAL HIGH (ref 65–99)
Glucose-Capillary: 269 mg/dL — ABNORMAL HIGH (ref 65–99)

## 2016-11-05 MED ORDER — ASPIRIN EC 81 MG PO TBEC
81.0000 mg | DELAYED_RELEASE_TABLET | Freq: Every day | ORAL | Status: DC
  Administered 2016-11-05 – 2016-11-08 (×4): 81 mg via ORAL
  Filled 2016-11-05 (×4): qty 1

## 2016-11-05 MED ORDER — ASPIRIN EC 81 MG PO TBEC: 81.0000 mg | DELAYED_RELEASE_TABLET | Freq: Every day | ORAL | Status: DC

## 2016-11-05 MED ORDER — ROPINIROLE HCL 0.5 MG PO TABS
0.2500 mg | ORAL_TABLET | Freq: Every day | ORAL | Status: DC
  Administered 2016-11-05 – 2016-11-07 (×3): 0.25 mg via ORAL
  Filled 2016-11-05 (×3): qty 1

## 2016-11-05 MED ORDER — ATORVASTATIN CALCIUM 80 MG PO TABS
80.0000 mg | ORAL_TABLET | Freq: Every day | ORAL | Status: DC
  Administered 2016-11-05 – 2016-11-08 (×4): 80 mg via ORAL
  Filled 2016-11-05 (×4): qty 1

## 2016-11-05 MED ORDER — FERRIC CITRATE 1 GM 210 MG(FE) PO TABS
210.0000 mg | ORAL_TABLET | Freq: Three times a day (TID) | ORAL | Status: DC
  Administered 2016-11-07 – 2016-11-08 (×3): 210 mg via ORAL
  Filled 2016-11-05 (×9): qty 1

## 2016-11-05 MED ORDER — NITROGLYCERIN 0.4 MG SL SUBL: 0.4000 mg | SUBLINGUAL_TABLET | SUBLINGUAL | Status: DC | PRN

## 2016-11-05 MED ORDER — DIAZEPAM 5 MG PO TABS
5.0000 mg | ORAL_TABLET | Freq: Three times a day (TID) | ORAL | Status: DC | PRN
  Administered 2016-11-07: 5 mg via ORAL
  Filled 2016-11-05: qty 1

## 2016-11-05 MED ORDER — BENEPROTEIN PO POWD
1.0000 | Freq: Three times a day (TID) | ORAL | Status: DC
  Administered 2016-11-07 – 2016-11-08 (×3): 6 g via ORAL
  Filled 2016-11-05: qty 227

## 2016-11-05 MED ORDER — ORAL CARE MOUTH RINSE
15.0000 mL | Freq: Two times a day (BID) | OROMUCOSAL | Status: DC
  Administered 2016-11-05 – 2016-11-07 (×4): 15 mL via OROMUCOSAL

## 2016-11-05 MED ORDER — CALCIUM ACETATE (PHOS BINDER) 667 MG PO CAPS
1334.0000 mg | ORAL_CAPSULE | Freq: Three times a day (TID) | ORAL | Status: DC
  Administered 2016-11-07 – 2016-11-08 (×3): 1334 mg via ORAL
  Filled 2016-11-05 (×3): qty 2

## 2016-11-05 MED ORDER — SERTRALINE HCL 100 MG PO TABS
200.0000 mg | ORAL_TABLET | Freq: Every day | ORAL | Status: DC
  Administered 2016-11-05 – 2016-11-08 (×4): 200 mg via ORAL
  Filled 2016-11-05 (×4): qty 2

## 2016-11-05 MED ORDER — INSULIN ASPART 100 UNIT/ML ~~LOC~~ SOLN
0.0000 [IU] | Freq: Three times a day (TID) | SUBCUTANEOUS | Status: DC
  Administered 2016-11-06 – 2016-11-07 (×3): 2 [IU] via SUBCUTANEOUS
  Administered 2016-11-08: 1 [IU] via SUBCUTANEOUS

## 2016-11-05 MED ORDER — INSULIN GLARGINE 100 UNIT/ML ~~LOC~~ SOLN
30.0000 [IU] | Freq: Every day | SUBCUTANEOUS | Status: DC
  Administered 2016-11-05 – 2016-11-07 (×3): 30 [IU] via SUBCUTANEOUS
  Filled 2016-11-05 (×5): qty 0.3

## 2016-11-05 MED ORDER — ACETAMINOPHEN 325 MG PO TABS: 650.0000 mg | ORAL_TABLET | ORAL | Status: DC | PRN

## 2016-11-05 MED ORDER — HYDROCODONE-ACETAMINOPHEN 5-325 MG PO TABS
1.0000 | ORAL_TABLET | Freq: Four times a day (QID) | ORAL | Status: DC | PRN
  Administered 2016-11-05 – 2016-11-08 (×5): 1 via ORAL
  Filled 2016-11-05 (×5): qty 1

## 2016-11-05 MED ORDER — NITROGLYCERIN 0.4 MG SL SUBL
0.4000 mg | SUBLINGUAL_TABLET | SUBLINGUAL | Status: DC | PRN
Start: 2016-11-05 — End: 2016-11-08

## 2016-11-05 NOTE — ED Notes (Signed)
Attempted report 

## 2016-11-05 NOTE — Consult Note (Signed)
Renal Service Consult Note [facility]  Briana [name] 11/05/2016 [place] D Requesting Physician:  G. Lovena Le  Reason for Consult: ESRD pt with bradycardia HPI: The patient is a 71 y.o. year-old with history of atrial flutter, HTN, PAD, DM, ESRD, diast CHF, pulm HTN and OSA, went to dialysis today and noted to have HR in the 30's.  Sent to ED, did not get dialysis.  Being admitted now per cardiology, we are asked to see for dialysis.   Pt noted DOE and fatigue over the last several days.  She denies any CP, fevers, cough or chills.  No SOB at rest.  On HD [address] in [place] for about 1 year, has LUA fistula.  No recent problems with HD.  Has had fistula "opened up" 2 times.    Grew up in [place], [place] in [place], lives with husband x 33 yrs in [place].  No tob Domenic Moras.  Hx MI 5/17, +DM on insulin.   Home meds > norvasc, asa, statin, phoslo, valium, zyrtec, Auryxia, vicodin, Humalog 75/25 bid, miralax, requip, zoloft, coumadin 5 mg daily, lovenox    ROS  denies CP  no joint pain   no HA  no blurry vision  no rash  no diarrhea  no nausea/ vomiting  no dysuria  no difficulty voiding  no change in urine color    Past Medical History  Past Medical History:  Diagnosis Date  . Adenomatous colon polyp 09/2007  . Arthritis   . Atrial fibrillation with rapid ventricular response ([place]) 10/05/2015  . Bradycardia   . CAD (coronary artery disease)    a. s/p CABG 2005.  . Carotid artery disease ([place])    a. s/p R CEA 2011. Followed by vascular -  duplex 04/16/16: <58% RICA, 09-98% LICA.  Marland Kitchen Chronic anemia   . Chronic diastolic CHF (congestive heart failure) ([place])   . Depression with anxiety   . Diabetes mellitus   . ESRD (end stage renal disease) ([place])    T Th Sat  . Heart murmur   . Hyperlipidemia   . Hypertension   . Neuropathy   . PAD (peripheral artery disease) ([place])   . Paroxysmal atrial flutter ([place])   . Peripheral vascular disease ([place])   .  Pulmonary hypertension ([place])   . Rheumatic fever   . Sleep apnea    Past Surgical History  Past Surgical History:  Procedure Laterality Date  . BASCILIC VEIN TRANSPOSITION Left 12/04/2015   Procedure: BASILIC VEIN TRANSPOSITION;  Surgeon: Rosetta Posner, MD;  Location: [place];  Service: Vascular;  Laterality: Left;  . CAROTID ENDARTERECTOMY  10/04/2009   Right CEA  . CORONARY ARTERY BYPASS GRAFT  2005  . PERIPHERAL VASCULAR CATHETERIZATION N/A 06/14/2016   Procedure: A/V Fistulagram - Left Upper;  Surgeon: Elam Dutch, MD;  Location: [place] [facility];  Service: Cardiovascular;  Laterality: N/A;  . POLYPECTOMY  02/25/2012   Procedure: POLYPECTOMY;  Surgeon: Danie Binder, MD;  Location: [facility];  Service: Endoscopy;  Laterality: N/A;  . TUBAL LIGATION     Family History  Family History  Problem Relation Age of Onset  . Breast cancer Mother   . Heart disease Father   . Diabetes Father   . Heart disease Brother    Social History  reports that she has never smoked. She has never used smokeless tobacco. She reports that she does not drink alcohol or use drugs. Allergies  Allergies  Allergen Reactions  .  Tape Other (See Comments)    TAPE WILL CAUSE BLISTERS/SORES; PLEASE USE COBAN WRAP!!  . Gemfibrozil Other (See Comments)    Patient is not aware of this allergy but states that she had side effects to a cholesterol medication in the past  . Oxycodone Other (See Comments)    Hallucinations   . Carbamazepine Other (See Comments)    Reaction to tegretol - "loopy"  . Clonazepam Other (See Comments)    Sleepy   . Macrobid [Nitrofurantoin] Nausea And Vomiting   Home medications Prior to Admission medications   Medication Sig Start Date End Date Taking? Authorizing Provider  amLODipine (NORVASC) 10 MG tablet Take 10 mg by mouth daily.   Yes [provider]  aspirin EC 81 MG EC tablet Take 1 tablet (81 mg total) by mouth daily. 10/18/15  Yes Reyne Dumas, MD  atorvastatin  (LIPITOR) 80 MG tablet Take 1 tablet (80 mg total) by mouth daily. 12/10/15  Yes Rama, Venetia Maxon, MD  calcium acetate (PHOSLO) 667 MG capsule Take 2 capsules (1,334 mg total) by mouth 3 (three) times daily with meals. 10/24/15  Yes Love, Ivan Anchors, PA-C  cetirizine (ZYRTEC) 10 MG tablet Take 10 mg by mouth daily.    Yes [provider]  diazepam (VALIUM) 5 MG tablet Take 5 mg by mouth 3 (three) times daily as needed for anxiety.    Yes [provider]  ferric citrate (AURYXIA) 1 GM 210 MG(Fe) tablet Take 210 mg by mouth 3 (three) times daily with meals.    Yes [provider]  HYDROcodone-acetaminophen (NORCO/VICODIN) 5-325 MG tablet Take 1 tablet by mouth every 6 (six) hours as needed for moderate pain (Must last 30 days.Do not take and drive a car or use machinery.). 09/18/16  Yes Sanjuana Kava, MD  Insulin Lispro Prot & Lispro (HUMALOG MIX 75/25 KWIKPEN) (75-25) 100 UNIT/ML Kwikpen Used 35 units with breakfast and 30 units with supper when pre-meal blood glucose is above 90 mg/dL. 09/25/16  Yes Nida, Marella Chimes, MD  nitroGLYCERIN (NITROSTAT) 0.4 MG SL tablet Place 1 tablet (0.4 mg total) under the tongue every 5 (five) minutes as needed for chest pain. Max three pills 10/24/15  Yes Love, Ivan Anchors, PA-C  polyethylene glycol (MIRALAX / GLYCOLAX) packet Take 17 g by mouth 2 (two) times daily. Patient taking differently: Take 17 g by mouth daily as needed for mild constipation.  12/10/15  Yes Rama, Venetia Maxon, MD  protein supplement (RESOURCE BENEPROTEIN) POWD Take 6 g by mouth 3 (three) times daily with meals. 10/24/15  Yes Love, Ivan Anchors, PA-C  rOPINIRole (REQUIP) 0.25 MG tablet Take 1 tablet (0.25 mg total) by mouth at bedtime. 12/10/15  Yes Rama, Venetia Maxon, MD  sertraline (ZOLOFT) 100 MG tablet Take 200 mg by mouth daily. 05/07/16  Yes [provider]  Vitamin D, Ergocalciferol, (DRISDOL) 50000 units CAPS capsule Take 50,000 Units by mouth every 7 (seven) days.  THURSDAY   Yes [provider]  warfarin (COUMADIN) 5 MG tablet TAKE AS DIRECTED Patient taking differently: Take 5 mg by mouth once a day at 6 PM 07/10/16  Yes Satira Sark, MD  blood glucose meter kit and supplies KIT Dispense based on patient and insurance preference. Use up to four times daily as directed. (FOR ICD-10 E11.65) 09/18/16   Cassandria Anger, MD  diclofenac (FLECTOR) 1.3 % PTCH Place 1 patch onto the skin 2 (two) times daily. Patient not taking: Reported on 11/05/2016 10/12/16  Thurnell Lose, MD  enoxaparin (LOVENOX) 100 MG/ML injection Inject 1 mL (100 mg total) into the skin daily. Patient not taking: Reported on 11/05/2016 10/12/16   Thurnell Lose, MD   Liver Function Tests No results for input(s): AST, ALT, ALKPHOS, BILITOT, PROT, ALBUMIN in the last 168 hours. No results for input(s): LIPASE, AMYLASE in the last 168 hours. CBC  Recent Labs Lab 11/05/16 1007  WBC 9.5  HGB 11.1*  HCT 36.2  MCV 95.5  PLT 980   Basic Metabolic Panel  Recent Labs Lab 11/05/16 1007  NA 133*  K 3.8  CL 96*  CO2 24  GLUCOSE 238*  BUN 58*  CREATININE 5.67*  CALCIUM 9.1   Iron/TIBC/Ferritin/ %Sat    Component Value Date/Time   IRON 42 11/21/2015 0518   TIBC 300 11/21/2015 0518   FERRITIN 188 11/21/2015 0518   IRONPCTSAT 14 11/21/2015 0518    Vitals:   11/05/16 1230 11/05/16 1300 11/05/16 1400 11/05/16 1445  BP: 138/88 (!) 129/39 (!) 122/42 (!) 108/48  Pulse: (!) 28 (!) 33 (!) 30 (!) 30  Resp: 15 (!) '25 20 20  ' SpO2: 100% 97% 96% 93%  Weight:      Height:       Exam Gen obese, pleasant WF no distress No rash, cyanosis or gangrene Sclera anicteric, throat clear  No jvd or bruits Chest clear bilat to bases irreg irreg, slow rhythm,  no MRG Abd soft ntnd no mass or ascites +bs obese GU defer MS no joint effusions or deformity Ext no LE or UE edema / no wounds or ulcers Neuro is alert, Ox 3 , nf LUA AVF +bruit    Dialysis: TTS  [facility] in [place], records pending    Assessment: 1  Bradycardia, symptomatic 2  Chronic afib 3  ESRD TTS hd stable lytes 4  Vol no gross excess, stable 5  HTN  6  DM2 on insulin  Plan - HD tomorrow after PPM procedure    Kelly Splinter MD [facility] pager [phone]   11/05/2016, 3:42 PM

## 2016-11-05 NOTE — ED Notes (Signed)
Cardiology paged to notify that RN needs new bed order instead of transfer bed order.

## 2016-11-05 NOTE — ED Triage Notes (Signed)
Per Pt, Pt is coming from dialysis with complaints of fatigue since Saturday. Pt reports diarrhea with no nausea or vomiting that started Saturday. Reports going to Dialysis today and pt was noted to have HR in 30s. Pt was sent to be evaluated. Denies any chest pain, SOB.

## 2016-11-05 NOTE — H&P (Signed)
ELECTROPHYSIOLOGY CONSULT NOTE    Patient ID: Briana [name] MRN: [id number], DOB/AGE: 29-Mar-1946 71 y.o.  Admit date: 11/05/2016 Date of Consult: 11/05/2016   Primary Physician: [name] Primary Cardiologist: Dr. Bronson Ing Electrophysiologist: Dr. Lovena Le  Reason for Consultation: bradycardia  HPI: Briana [name] is a 71 y.o. female with PMHX of CAD (CABG 2005), PVF w/R CEA 2011), ESRF on HD< DM, PAFib/flutter, HTN, HLD, chronic anemia, chronic CHF (diastolic), severe p.HTN.  Recently she has had both in and out patient visits for bradycardia.  Most recently last month during a hospital stay her metoprolol (12.5mg  BID dose) was held.  She evaluated by EP service (Dr. Curt Bears) and off her BB observed to have nocturnal brady with rates into the 30's, though daytime 50's-60's, ambulated with staff with observation of her HR to increase maintain 60's with once transient rate 47 without symptoms.  Give she was without symptoms of bradycardia, her increased infection risk with HD and the patient's preference to try and avoid pacing was recommended to stay off her BB and follow up out patient.  She was seen in follow up by cardiology service 10/16/16 doing well and remained with out brady symptoms, again generally without energy though likely is multifactorial with her significant comorbid conditions.  Planned for EP f/u next week with Dr. Lovena Le.  She returned to the ER today accompanied by her daughter.  She went to dialysis today and they found her HR too slow to proceed and recommended they call 911 for her but she declined and came in with family (rather then local ER).  She had been doing fairly well, admittedly without much energy until the weekend, early Saturday AM after using the bathroom felt quite lightheaded and had to sit a couple minutes, since then has felt more run down, getting winded with minimal ambulation.  She has not had any kind of CP.  No syncope.  She  confirms not being on metoprolol at home since last admit.  LABS: K+ 3.8 BUN/Creat 58/5.67 (HD patient) poc Trop 0.05 WBC 9.5 H/H 11.1/26.2 plts 191 INR yesterday was 2.7 10/09/16 TSH 2.454   Past Medical History:  Diagnosis Date  . Adenomatous colon polyp 09/2007  . Arthritis   . Atrial fibrillation with rapid ventricular response ([place]) 10/05/2015  . Bradycardia   . CAD (coronary artery disease)    a. s/p CABG 2005.  . Carotid artery disease ([place])    a. s/p R CEA 2011. Followed by vascular -  duplex 04/16/16: <95% RICA, 28-41% LICA.  Marland Kitchen Chronic anemia   . Chronic diastolic CHF (congestive heart failure) ([place])   . Depression with anxiety   . Diabetes mellitus   . ESRD (end stage renal disease) ([place])    T Th Sat  . Heart murmur   . Hyperlipidemia   . Hypertension   . Neuropathy   . PAD (peripheral artery disease) ([place])   . Paroxysmal atrial flutter ([place])   . Peripheral vascular disease ([place])   . Pulmonary hypertension ([place])   . Rheumatic fever   . Sleep apnea      Surgical History:  Past Surgical History:  Procedure Laterality Date  . BASCILIC VEIN TRANSPOSITION Left 12/04/2015   Procedure: BASILIC VEIN TRANSPOSITION;  Surgeon: Rosetta Posner, MD;  Location: [facility];  Service: Vascular;  Laterality: Left;  . CAROTID ENDARTERECTOMY  10/04/2009   Right CEA  . CORONARY ARTERY BYPASS GRAFT  2005  . PERIPHERAL VASCULAR CATHETERIZATION N/A 06/14/2016  Procedure: A/V Fistulagram - Left Upper;  Surgeon: Elam Dutch, MD;  Location: [facility];  Service: Cardiovascular;  Laterality: N/A;  . POLYPECTOMY  02/25/2012   Procedure: POLYPECTOMY;  Surgeon: Danie Binder, MD;  Location: [facility];  Service: Endoscopy;  Laterality: N/A;  . TUBAL LIGATION        (Not in a hospital admission)  Inpatient Medications:   Allergies:  Allergies  Allergen Reactions  . Tape Other (See Comments)    TAPE WILL CAUSE BLISTERS/SORES; PLEASE USE COBAN WRAP!!  . Gemfibrozil Other (See  Comments)    Patient is not aware of this allergy but states that she had side effects to a cholesterol medication in the past  . Oxycodone Other (See Comments)    Hallucinations   . Carbamazepine Other (See Comments)    Reaction to tegretol - "loopy"  . Clonazepam Other (See Comments)    Sleepy   . Macrobid [Nitrofurantoin] Nausea And Vomiting    Social History   Social History  . Marital status: Married    Spouse name: N/A  . Number of children: N/A  . Years of education: N/A   Occupational History  . Not on file.   Social History Main Topics  . Smoking status: Never Smoker  . Smokeless tobacco: Never Used  . Alcohol use No  . Drug use: No  . Sexual activity: Not on file   Other Topics Concern  . Not on file   Social History Narrative  . No narrative on file     Family History  Problem Relation Age of Onset  . Breast cancer Mother   . Heart disease Father   . Diabetes Father   . Heart disease Brother      Review of Systems: All other systems reviewed and are otherwise negative except as noted above.  Physical Exam: Vitals:   11/05/16 1042 11/05/16 1042 11/05/16 1100 11/05/16 1102  BP:    (!) 142/52  Pulse:   (!) 28   Resp:   16   SpO2: 93% (!) 89% 97%   Weight:      Height:        GEN- The patient is well appearing, alert and oriented x 3 today.   HEENT: normocephalic, atraumatic; sclera clear, conjunctiva pink; hearing intact; oropharynx clear; neck supple, no JVP Lymph- no cervical lymphadenopathy Lungs- CTA b/l, normal work of breathing.  No wheezes, rales, rhonchi Heart- IRRR, bradycardic,  1/6 SM, rubs or gallops, PMI not laterally displaced GI- soft, non-tender, non-distended Extremities- no clubbing, cyanosis, or edema MS- no significant deformity, age appropriate atrophy Skin- warm and dry, no rash or lesion Psych- euthymic mood, full affect Neuro- no gross deficits observed  Labs:   Lab Results  Component Value Date   WBC 9.5  11/05/2016   HGB 11.1 (L) 11/05/2016   HCT 36.2 11/05/2016   MCV 95.5 11/05/2016   PLT 191 11/05/2016    Recent Labs Lab 11/05/16 1007  NA 133*  K 3.8  CL 96*  CO2 24  BUN 58*  CREATININE 5.67*  CALCIUM 9.1  GLUCOSE 238*      Radiology/Studies:   Dg Chest Port 1 View Result Date: 11/05/2016 CLINICAL DATA:  Shortness of breath. EXAM: PORTABLE CHEST 1 VIEW COMPARISON:  Radiographs of Oct 09, 2016. FINDINGS: Stable cardiomegaly with mild central pulmonary vascular congestion. Status post coronary artery bypass graft. No pneumothorax or pleural effusion is noted. Bony thorax is unremarkable. No consolidative process is  noted. IMPRESSION: Stable cardiomegaly with mild central pulmonary vascular congestion. Electronically Signed   By: Marijo Conception, M.D.   On: 11/05/2016 11:38    EKG:Atypical atrial Flutter, 20's TELEMETRY: Aflutter 28-40's   10/02/15: TEE Study Conclusions - Left ventricle: The cavity size was normal. There was mild focal basal hypertrophy of the septum. Systolic function was normal. The estimated ejection fraction was in the range of 55% to 60%. Wall motion was normal; there were no regional wall motion abnormalities. There was a reduced contribution of atrial contraction to ventricular filling, due to increased ventricular diastolic pressure or atrial contractile dysfunction. Doppler parameters are consistent with a reversible restrictive pattern, indicative of decreased left ventricular diastolic compliance and/or increased left atrial pressure (grade 3 diastolic dysfunction). Doppler parameters are consistent with high ventricular filling pressure. - Aortic valve: Severe diffuse thickening and calcification, consistent with sclerosis. There was mild regurgitation. Valve area (VTI): 1.93 cm^2. Valve area (Vmax): 1.83 cm^2. Valve area (Vmean): 1.97 cm^2. - Mitral valve: Severely calcified annulus. Mild diffuse calcification,  with moderate involvement of chords. There was mild regurgitation. Valve area by continuity equation (using LVOT flow): 1.87 cm^2. - Left atrium: The atrium was severely dilated. 32mm - Tricuspid valve: There was moderate regurgitation. - Pulmonary arteries: PA peak pressure: 69 mm Hg (S). Impressions: - The right ventricular systolic pressure was increased consistent with severe pulmonary hypertension.   In review of notes, her last ischemic w/u: Nuc 09/2015 showed concern for mild peri-infarct ischemia involving the anterior wall and anteroseptal wall; evidence for infarcts involving the anterior and lateral walls, EF 45%. Cath was deferred given her CKD (she had not yet progressed to ESRD until later in the summer). This has since been managed medically in absence of anginal symptoms, with cautious continuation of aspirin per primary cardiologist    Assessment and Plan:  1. Bradycardia     Symptomatic with change in her exertional capacity this weekend and increased fatigue     She is on amlodipine, no rate limiting nodal blocking medicines     Historically she has wanted to avoid pacing and we have wanted to as well given her increased infection risk and until now not clearly symptomatic     Her HR while awake and talking to me gets 30's low 40's, though is seen high 20's as well     She has a good BP and is mentating well at this time     I feel she will  Need pacing at this point     I have discussed pacer procedure, risks and benefits, she is will ing to proceed pending visit with Dr. Lovena Le   2. Permanent AFib     She last saw Dr. Lovena Le in Dec who mentioned she has a  Multitude of atrial arrhythmias and was in AF at that time, her amiodarone was stopped with plans for rate control strategy alone.     CHA2DS2Vasc is at least 5 on warfarin     INR yesterday 2.7, will hold warfarin in anticipation of pacer  3. ESRFon HD     T-TH-Sat schedule      Missed today will aske  nephrology to her case  4. CAD     She has been managed conservatively of late remains without anginal symptoms  5. DM     Will order SSI and ask diabetes RN to consult   Dr. Lovena Le to see     Signed, Tommye Standard, PA-C 11/05/2016 11:47 AM  EP Attending  Patient seen and examined. Agree with above. The patient is known to me from prior clinic visits with atrial fib and slow VR. She is now off of all AV nodal blocking drugs and has symptomatic and intermittent CHB with a wide and narrow ventricular escape. Her exam is notable for a chronically ill appearing obese woman, NAD. Lungs are clear and CV reveals an IRIR bradycardia. Extremites with  Traced of edema. ECG reveals atrial fib/flutter with a slow VR.  A/P 1. CHB - I have discussed the treatment options with the patient and the risks/benefits/goals/expectations of PPM insertion were discussed and she wishes to proceed.  2. Atrial fib - her ventricular rate is very slow despite being off of AV nodal blocking drugs.  3. Coags - her INR was 2.7. Coumadin has been held.  4. CAD - she denies anginal symptoms.   Mikle Bosworth.D.

## 2016-11-05 NOTE — ED Notes (Signed)
X-ray at bedside

## 2016-11-05 NOTE — ED Notes (Signed)
Cardiology putting in new bed order at this time.

## 2016-11-05 NOTE — ED Notes (Signed)
Pt. Given sip of water with EDP approval.

## 2016-11-05 NOTE — ED Provider Notes (Signed)
[facility] DEPT Provider Note   CSN: [id number] Arrival date & time: 11/05/16  0102     History   Chief Complaint Chief Complaint  Patient presents with  . Bradycardia    HPI Briana [name] is a 71 y.o. female.  HPI 71 year old female with a history of A. fib on Coumadin presents to the ED with 2-3 days of dyspnea on exertion and fatigue as well as reported bradycardia. She reports that her heart rate has been in the 30s and 40s over the past few days. No other alleviating or aggravating factors. Seen by her home health nurse yesterday who recommended she come to the emergency department for evaluation however patient decided not to at that time. She went to go get dialysis today and they will not perform dialysis due to her bradycardia. Denies any chest pain, headache, dizziness, nausea, vomiting, abdominal pain. Denies any other physical complaints.  Of note patient was taken off her metoprolol 2 months ago. She is not currently on any rate controlling meds.  Past Medical History:  Diagnosis Date  . Adenomatous colon polyp 09/2007  . Arthritis   . Atrial fibrillation with rapid ventricular response ([place]) 10/05/2015  . Bradycardia   . CAD (coronary artery disease)    a. s/p CABG 2005.  . Carotid artery disease ([place])    a. s/p R CEA 2011. Followed by vascular -  duplex 04/16/16: <72% RICA, 53-66% LICA.  Marland Kitchen Chronic anemia   . Chronic diastolic CHF (congestive heart failure) ([place])   . Depression with anxiety   . Diabetes mellitus   . ESRD (end stage renal disease) ([place])    T Th Sat  . Heart murmur   . Hyperlipidemia   . Hypertension   . Neuropathy   . PAD (peripheral artery disease) ([place])   . Paroxysmal atrial flutter ([place])   . Peripheral vascular disease ([place])   . Pulmonary hypertension ([place])   . Rheumatic fever   . Sleep apnea     Patient Active Problem List   Diagnosis Date Noted  . 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 Paroxysmal atrial  flutter ([place]) 10/15/2016  . Bradycardia 10/09/2016  . Personal history of noncompliance with medical treatment, presenting hazards to health 09/17/2016  . Essential hypertension   . Anemia of chronic kidney failure   . Chronic diastolic heart failure ([place]) 11/10/2015  . Depression   . Hypoalbuminemia due to protein-calorie malnutrition ([place])   . Debility 10/18/2015  . Abnormality of gait   . Long term current use of anticoagulant therapy   . Chronic pain syndrome   . Hx of CABG 2005   . Hx of non-ST elevation myocardial infarction (NSTEMI)   . Paroxysmal atrial fibrillation (HCC)   . Diabetes mellitus with end-stage renal disease ([place])   . ESRD (end stage renal disease) on dialysis ([place])   . AKI (acute kidney injury) ([place])   . Non-ST elevation myocardial infarction (NSTEMI) ([place]) 10/14/2015  . Pulmonary edema   . Abnormal nuclear stress test   . Acute renal failure with tubular necrosis ([place])   . Hypertensive emergency   . CAP (community acquired pneumonia)   . CAD in native artery   . Pulmonary hypertension ([place])   . Atrial fibrillation with RVR ([place])   . Acute on chronic renal failure ([place])   . Physical deconditioning   . Acute respiratory failure with hypoxia ([place])   . Acute pulmonary edema (HCC)   . Acute blood loss anemia   .  Acute respiratory failure ([place]) 10/01/2015  . Hypoxia 10/01/2015  . Elevated troponin 10/01/2015  . Chest pain   . DVT (deep venous thrombosis) ([place])   . Hypoxemia   . Mixed hyperlipidemia 03/28/2015  . Vitamin D deficiency 03/28/2015  . Acute renal failure ([place]) 03/04/2014  . UTI (lower urinary tract infection) 03/02/2014  . Acute encephalopathy 03/02/2014  . Dehydration 03/02/2014  . Generalized weakness 03/02/2014  . Obesity 03/02/2014  . Depressed mood 03/02/2014  . Hx of adenomatous colonic polyps 01/28/2012  . Occlusion and stenosis of carotid artery without mention of cerebral infarction 12/03/2011    Past Surgical History:    Procedure Laterality Date  . BASCILIC VEIN TRANSPOSITION Left 12/04/2015   Procedure: BASILIC VEIN TRANSPOSITION;  Surgeon: Rosetta Posner, MD;  Location: [facility];  Service: Vascular;  Laterality: Left;  . CAROTID ENDARTERECTOMY  10/04/2009   Right CEA  . CORONARY ARTERY BYPASS GRAFT  2005  . PERIPHERAL VASCULAR CATHETERIZATION N/A 06/14/2016   Procedure: A/V Fistulagram - Left Upper;  Surgeon: Elam Dutch, MD;  Location: [facility];  Service: Cardiovascular;  Laterality: N/A;  . POLYPECTOMY  02/25/2012   Procedure: POLYPECTOMY;  Surgeon: Danie Binder, MD;  Location: [facility];  Service: Endoscopy;  Laterality: N/A;  . TUBAL LIGATION      OB History    No data available       Home Medications    Prior to Admission medications   Medication Sig Start Date End Date Taking? Authorizing Provider  amLODipine (NORVASC) 10 MG tablet Take 10 mg by mouth daily.    [provider]  aspirin EC 81 MG EC tablet Take 1 tablet (81 mg total) by mouth daily. 10/18/15   Reyne Dumas, MD  atorvastatin (LIPITOR) 80 MG tablet Take 1 tablet (80 mg total) by mouth daily. Patient taking differently: Take 80 mg by mouth every other day.  12/10/15   Rama, Venetia Maxon, MD  blood glucose meter kit and supplies KIT Dispense based on patient and insurance preference. Use up to four times daily as directed. (FOR ICD-10 E11.65) 09/18/16   Cassandria Anger, MD  calcium acetate (PHOSLO) 667 MG capsule Take 2 capsules (1,334 mg total) by mouth 3 (three) times daily with meals. Patient taking differently: Take 667 mg by mouth 3 (three) times daily with meals.  10/24/15   Love, Ivan Anchors, PA-C  cetirizine (ZYRTEC) 10 MG tablet Take 10 mg by mouth daily.     [provider]  diazepam (VALIUM) 5 MG tablet Take 5 mg by mouth 3 (three) times daily as needed for anxiety.     [provider]  diclofenac (FLECTOR) 1.3 % PTCH Place 1 patch onto the skin 2 (two) times daily. 10/12/16   Thurnell Lose, MD  enoxaparin (LOVENOX) 100 MG/ML injection Inject 1 mL (100 mg total) into the skin daily. 10/12/16   Thurnell Lose, MD  ferric citrate (AURYXIA) 1 GM 210 MG(Fe) tablet Take 210 mg by mouth 3 (three) times daily with meals.     [provider]  HYDROcodone-acetaminophen (NORCO/VICODIN) 5-325 MG tablet Take 1 tablet by mouth every 6 (six) hours as needed for moderate pain (Must last 30 days.Do not take and drive a car or use machinery.). 09/18/16   Sanjuana Kava, MD  Insulin Lispro Prot & Lispro (HUMALOG MIX 75/25 KWIKPEN) (75-25) 100 UNIT/ML Kwikpen Used 35 units with breakfast and 30 units with supper when pre-meal blood glucose is above  90 mg/dL. Patient taking differently: Inject 30 Units into the skin 2 (two) times daily before a meal.  09/25/16   Nida, Marella Chimes, MD  nitroGLYCERIN (NITROSTAT) 0.4 MG SL tablet Place 1 tablet (0.4 mg total) under the tongue every 5 (five) minutes as needed for chest pain. Max three pills 10/24/15   Love, Ivan Anchors, PA-C  polyethylene glycol (MIRALAX / GLYCOLAX) packet Take 17 g by mouth 2 (two) times daily. Patient taking differently: Take 17 g by mouth daily as needed for mild constipation.  12/10/15   Rama, Venetia Maxon, MD  protein supplement (RESOURCE BENEPROTEIN) POWD Take 6 g by mouth 3 (three) times daily with meals. 10/24/15   Love, Ivan Anchors, PA-C  rOPINIRole (REQUIP) 0.25 MG tablet Take 1 tablet (0.25 mg total) by mouth at bedtime. 12/10/15   Rama, Venetia Maxon, MD  sertraline (ZOLOFT) 100 MG tablet Take 200 mg by mouth daily. 05/07/16   [provider]  sevelamer carbonate (RENVELA) 800 MG tablet Take 800 mg by mouth 3 (three) times daily with meals.    [provider]  Vitamin D, Ergocalciferol, (DRISDOL) 50000 units CAPS capsule Take 50,000 Units by mouth every 7 (seven) days.    [provider]  warfarin (COUMADIN) 5 MG tablet TAKE AS DIRECTED Patient taking differently: Take 5 mg by mouth once a day  at 6 PM 07/10/16   Satira Sark, MD    Family History Family History  Problem Relation Age of Onset  . Breast cancer Mother   . Heart disease Father   . Diabetes Father   . Heart disease Brother     Social History Social History  Substance Use Topics  . Smoking status: Never Smoker  . Smokeless tobacco: Never Used  . Alcohol use No     Allergies   Tape; Gemfibrozil; Oxycodone; Carbamazepine; Clonazepam; and Macrobid [nitrofurantoin]   Review of Systems Review of Systems All other systems are reviewed and are negative for acute change except as noted in the HPI   Physical Exam Updated Vital Signs BP (!) 154/89 (BP Location: Left Arm)   Pulse (!) 33   Resp 20   Ht '5\' 4"'  (1.626 m)   Wt 97.1 kg (214 lb)   SpO2 (!) 89%   BMI 36.73 kg/m   Physical Exam  Constitutional: She is oriented to person, place, and time. She appears well-developed and well-nourished. No distress.  HENT:  Head: Normocephalic and atraumatic.  Nose: Nose normal.  Eyes: Conjunctivae and EOM are normal. Pupils are equal, round, and reactive to light. Right eye exhibits no discharge. Left eye exhibits no discharge. No scleral icterus.  Neck: Normal range of motion. Neck supple.  Cardiovascular: Regular rhythm.  Bradycardia present.  Exam reveals no gallop and no friction rub.   No murmur heard. Pulmonary/Chest: Effort normal and breath sounds normal. No stridor. No respiratory distress. She has no rales.  Abdominal: Soft. She exhibits no distension. There is no tenderness.  Musculoskeletal: She exhibits no edema or tenderness.  Neurological: She is alert and oriented to person, place, and time.  Skin: Skin is warm and dry. No rash noted. She is not diaphoretic. No erythema.  Psychiatric: She has a normal mood and affect.  Vitals reviewed.    ED Treatments / Results  Labs (all labs ordered are listed, but only abnormal results are displayed) Labs Reviewed  BASIC METABOLIC PANEL -  Abnormal; Notable for the following:       Result Value  Sodium 133 (*)    Chloride 96 (*)    Glucose, Bld 238 (*)    BUN 58 (*)    Creatinine, Ser 5.67 (*)    GFR calc non Af Amer 7 (*)    GFR calc Af Amer 8 (*)    All other components within normal limits  CBC - Abnormal; Notable for the following:    RBC 3.79 (*)    Hemoglobin 11.1 (*)    All other components within normal limits  I-STAT TROPOININ, ED    EKG  EKG Interpretation  Date/Time:  Tuesday November 05 2016 10:02:15 EDT Ventricular Rate:  47 PR Interval:    QRS Duration: 184 QT Interval:  446 QTC Calculation: 394 R Axis:   -60 Text Interpretation:  Undetermined rhythm Left axis deviation Left bundle branch block Abnormal ECG Confirmed by Addison Lank [phone]) on 11/05/2016 10:54:44 AM       Radiology Dg Chest Port 1 View  Result Date: 11/05/2016 CLINICAL DATA:  Shortness of breath. EXAM: PORTABLE CHEST 1 VIEW COMPARISON:  Radiographs of Oct 09, 2016. FINDINGS: Stable cardiomegaly with mild central pulmonary vascular congestion. Status post coronary artery bypass graft. No pneumothorax or pleural effusion is noted. Bony thorax is unremarkable. No consolidative process is noted. IMPRESSION: Stable cardiomegaly with mild central pulmonary vascular congestion. Electronically Signed   By: Marijo Conception, M.D.   On: 11/05/2016 11:38    Procedures Procedures (including critical care time) CRITICAL CARE Performed by: Grayce Sessions Cardama Total critical care time: 35 minutes Critical care time was exclusive of separately billable procedures and treating other patients. Critical care was necessary to treat or prevent imminent or life-threatening deterioration. Critical care was time spent personally by me on the following activities: development of treatment plan with patient and/or surrogate as well as nursing, discussions with consultants, evaluation of patient's response to treatment, examination of patient, obtaining  history from patient or surrogate, ordering and performing treatments and interventions, ordering and review of laboratory studies, ordering and review of radiographic studies, pulse oximetry and re-evaluation of patient's condition.   Medications Ordered in ED Medications - No data to display   Initial Impression / Assessment and Plan / ED Course  I have reviewed the triage vital signs and the nursing notes.  Pertinent labs & imaging results that were available during my care of the patient were reviewed by me and considered in my medical decision making (see chart for details).     Symptomatic bradycardia with A. fib/atrial flutter with rate in the 20s and 30s. Otherwise, hemodynamically stable. Pacer pads placed on the patient for precaution. Cardiology consulted who will admit the patient for further management.  Final Clinical Impressions(s) / ED Diagnoses   Final diagnoses:  Symptomatic bradycardia      Cardama, Grayce Sessions, MD 11/05/16 1423

## 2016-11-05 NOTE — ED Notes (Signed)
edp at bedside  

## 2016-11-05 NOTE — ED Notes (Signed)
Cardiology at bedside.

## 2016-11-06 ENCOUNTER — Encounter (HOSPITAL_COMMUNITY)
Admission: EM | Disposition: A | Discharge status: Home / Self Care | Payer: Self-pay | Source: Home / Self Care | Attending: Internal Medicine

## 2016-11-06 HISTORY — PX: PACEMAKER IMPLANT: EP1218

## 2016-11-06 LAB — CBC
HEMATOCRIT: 31 % — AB (ref 36.0–46.0)
Hemoglobin: 9.7 g/dL — ABNORMAL LOW (ref 12.0–15.0)
MCH: 29.6 pg (ref 26.0–34.0)
MCHC: 31.3 g/dL (ref 30.0–36.0)
MCV: 94.5 fL (ref 78.0–100.0)
Platelets: 167 10*3/uL (ref 150–400)
RBC: 3.28 MIL/uL — AB (ref 3.87–5.11)
RDW: 15.1 % (ref 11.5–15.5)
WBC: 9.1 10*3/uL (ref 4.0–10.5)

## 2016-11-06 LAB — PROTIME-INR
INR: 2.29
Prothrombin Time: 25.6 seconds — ABNORMAL HIGH (ref 11.4–15.2)

## 2016-11-06 LAB — RENAL FUNCTION PANEL
Albumin: 3.2 g/dL — ABNORMAL LOW (ref 3.5–5.0)
Anion gap: 12 (ref 5–15)
BUN: 71 mg/dL — AB (ref 6–20)
CO2: 23 mmol/L (ref 22–32)
CREATININE: 6.27 mg/dL — AB (ref 0.44–1.00)
Calcium: 8.3 mg/dL — ABNORMAL LOW (ref 8.9–10.3)
Chloride: 99 mmol/L — ABNORMAL LOW (ref 101–111)
GFR calc Af Amer: 7 mL/min — ABNORMAL LOW (ref >60)
GFR, EST NON AFRICAN AMERICAN: 6 mL/min — AB (ref >60)
Glucose, Bld: 231 mg/dL — ABNORMAL HIGH (ref 65–99)
PHOSPHORUS: 5.4 mg/dL — AB (ref 2.5–4.6)
POTASSIUM: 4.1 mmol/L (ref 3.5–5.1)
Sodium: 134 mmol/L — ABNORMAL LOW (ref 135–145)

## 2016-11-06 LAB — SURGICAL PCR SCREEN
MRSA, PCR: NEGATIVE
Staphylococcus aureus: POSITIVE — AB

## 2016-11-06 LAB — GLUCOSE, CAPILLARY
GLUCOSE-CAPILLARY: 197 mg/dL — AB (ref 65–99)
GLUCOSE-CAPILLARY: 181 mg/dL — AB (ref 65–99)
Glucose-Capillary: 169 mg/dL — ABNORMAL HIGH (ref 65–99)
Glucose-Capillary: 159 mg/dL — ABNORMAL HIGH (ref 65–99)

## 2016-11-06 SURGERY — PACEMAKER IMPLANT
Anesthesia: LOCAL

## 2016-11-06 MED ORDER — CEFAZOLIN SODIUM-DEXTROSE 1-4 GM/50ML-% IV SOLN
1.0000 g | Freq: Four times a day (QID) | INTRAVENOUS | Status: AC
Start: 2016-11-06 — End: 2016-11-07
  Administered 2016-11-06 – 2016-11-07 (×3): 1 g via INTRAVENOUS
  Filled 2016-11-06 (×3): qty 50

## 2016-11-06 MED ORDER — ONDANSETRON HCL 4 MG/2ML IJ SOLN: 4.0000 mg | Freq: Four times a day (QID) | INTRAMUSCULAR | Status: DC | PRN

## 2016-11-06 MED ORDER — LIDOCAINE HCL (PF) 1 % IJ SOLN: 5.0000 mL | INTRAMUSCULAR | Status: DC | PRN

## 2016-11-06 MED ORDER — ALTEPLASE 2 MG IJ SOLR: 2.0000 mg | Freq: Once | INTRAMUSCULAR | Status: DC | PRN

## 2016-11-06 MED ORDER — CHLORHEXIDINE GLUCONATE CLOTH 2 % EX PADS
6.0000 | MEDICATED_PAD | Freq: Every day | CUTANEOUS | Status: DC
  Administered 2016-11-06: 6 via TOPICAL

## 2016-11-06 MED ORDER — PENTAFLUOROPROP-TETRAFLUOROETH EX AERO: 1.0000 "application " | INHALATION_SPRAY | CUTANEOUS | Status: DC | PRN

## 2016-11-06 MED ORDER — SODIUM CHLORIDE 0.9 % IV SOLN
INTRAVENOUS | Status: DC
  Administered 2016-11-06: 08:00:00 via INTRAVENOUS

## 2016-11-06 MED ORDER — SODIUM CHLORIDE 0.9 % IV SOLN: 100.0000 mL | INTRAVENOUS | Status: DC | PRN

## 2016-11-06 MED ORDER — HEPARIN SODIUM (PORCINE) 1000 UNIT/ML DIALYSIS: 1000.0000 [IU] | INTRAMUSCULAR | Status: DC | PRN

## 2016-11-06 MED ORDER — LIDOCAINE HCL (PF) 1 % IJ SOLN
INTRAMUSCULAR | Status: DC | PRN
  Administered 2016-11-06: 48 mL

## 2016-11-06 MED ORDER — MIDAZOLAM HCL 5 MG/5ML IJ SOLN
INTRAMUSCULAR | Status: AC
  Filled 2016-11-06: qty 5

## 2016-11-06 MED ORDER — FENTANYL CITRATE (PF) 100 MCG/2ML IJ SOLN
INTRAMUSCULAR | Status: AC
  Filled 2016-11-06: qty 2

## 2016-11-06 MED ORDER — CHLORHEXIDINE GLUCONATE 4 % EX LIQD
60.0000 mL | Freq: Once | CUTANEOUS | Status: AC
  Administered 2016-11-06: 4 via TOPICAL

## 2016-11-06 MED ORDER — LIDOCAINE-PRILOCAINE 2.5-2.5 % EX CREA: 1.0000 "application " | TOPICAL_CREAM | CUTANEOUS | Status: DC | PRN

## 2016-11-06 MED ORDER — SODIUM CHLORIDE 0.9 % IR SOLN
Status: AC
  Filled 2016-11-06: qty 2

## 2016-11-06 MED ORDER — FENTANYL CITRATE (PF) 100 MCG/2ML IJ SOLN
INTRAMUSCULAR | Status: DC | PRN
Start: 2016-11-06 — End: 2016-11-06
  Administered 2016-11-06: 12.5 ug via INTRAVENOUS

## 2016-11-06 MED ORDER — HEPARIN SODIUM (PORCINE) 1000 UNIT/ML DIALYSIS
1000.0000 [IU] | INTRAMUSCULAR | Status: DC | PRN
  Filled 2016-11-06: qty 1

## 2016-11-06 MED ORDER — MUPIROCIN 2 % EX OINT
1.0000 "application " | TOPICAL_OINTMENT | Freq: Two times a day (BID) | CUTANEOUS | Status: DC
  Administered 2016-11-06 – 2016-11-08 (×5): 1 via NASAL
  Filled 2016-11-06: qty 22

## 2016-11-06 MED ORDER — CHLORHEXIDINE GLUCONATE 4 % EX LIQD
60.0000 mL | Freq: Once | CUTANEOUS | Status: DC
  Filled 2016-11-06: qty 15

## 2016-11-06 MED ORDER — HEPARIN (PORCINE) IN NACL 2-0.9 UNIT/ML-% IJ SOLN
INTRAMUSCULAR | Status: AC | PRN
  Administered 2016-11-06: 500 mL

## 2016-11-06 MED ORDER — LIDOCAINE-PRILOCAINE 2.5-2.5 % EX CREA
1.0000 "application " | TOPICAL_CREAM | CUTANEOUS | Status: DC | PRN
  Filled 2016-11-06: qty 5

## 2016-11-06 MED ORDER — ACETAMINOPHEN 325 MG PO TABS
325.0000 mg | ORAL_TABLET | ORAL | Status: DC | PRN
  Administered 2016-11-06: 325 mg via ORAL
  Administered 2016-11-07: 650 mg via ORAL
  Filled 2016-11-06: qty 1
  Filled 2016-11-06: qty 2

## 2016-11-06 MED ORDER — CEFAZOLIN SODIUM-DEXTROSE 2-4 GM/100ML-% IV SOLN
2.0000 g | INTRAVENOUS | Status: AC
  Administered 2016-11-06: 2 g via INTRAVENOUS

## 2016-11-06 MED ORDER — CEFAZOLIN SODIUM-DEXTROSE 2-4 GM/100ML-% IV SOLN
INTRAVENOUS | Status: AC
  Filled 2016-11-06: qty 100

## 2016-11-06 MED ORDER — SODIUM CHLORIDE 0.9 % IR SOLN
80.0000 mg | Status: AC
  Administered 2016-11-06: 80 mg

## 2016-11-06 SURGICAL SUPPLY — 13 items
CABLE SURGICAL S-101-97-12 (CABLE) ×1 IMPLANT
CATH RIGHTSITE C315HIS02 (CATHETERS) ×1 IMPLANT
IPG PACE AZUR XT DR MRI W1DR01 (Pacemaker) IMPLANT
LEAD CAPSURE NOVUS 5076-52CM (Lead) ×1 IMPLANT
LEAD SELECT SECURE 3830 383069 (Lead) IMPLANT
PACE AZURE XT DR MRI W1DR01 (Pacemaker) ×2 IMPLANT
PAD DEFIB LIFELINK (PAD) ×1 IMPLANT
SELECT SECURE 3830 383069 (Lead) ×2 IMPLANT
SHEATH CLASSIC 7F (SHEATH) ×1 IMPLANT
SHEATH CLASSIC 9F (SHEATH) ×1 IMPLANT
SLITTER 6232ADJ (MISCELLANEOUS) ×1 IMPLANT
TRAY PACEMAKER INSERTION (PACKS) ×1 IMPLANT
WIRE HI TORQ VERSACORE-J 145CM (WIRE) ×1 IMPLANT

## 2016-11-06 NOTE — Progress Notes (Signed)
  [facility] Progress Note   Subjective: delay in PPM procedure, may or may not have it today. nonew c/o's  Vitals:   11/06/16 0646 11/06/16 0751 11/06/16 0802 11/06/16 1124  BP:  (!) 124/29 (!) 138/39 (!) 133/29  Pulse:  (!) 54  (!) 58  Resp:  18  (!) 22  Temp:  98.1 F (36.7 C)  98.4 F (36.9 C)  TempSrc:  Oral  Oral  SpO2: 91% 97%  91%  Weight:      Height:        Inpatient medications: . aspirin EC  81 mg Oral Daily  . atorvastatin  80 mg Oral Daily  . calcium acetate  1,334 mg Oral TID WC  . chlorhexidine  60 mL Topical Once  . Chlorhexidine Gluconate Cloth  6 each Topical Daily  . ferric citrate  210 mg Oral TID WC  . gentamicin irrigation  80 mg Irrigation On Call  . insulin aspart  0-9 Units Subcutaneous TID WC  . insulin glargine  30 Units Subcutaneous QHS  . mouth rinse  15 mL Mouth Rinse BID  . mupirocin ointment  1 application Nasal BID  . protein supplement  1 scoop Oral TID WC  . rOPINIRole  0.25 mg Oral QHS  . sertraline  200 mg Oral Daily   .  ceFAZolin (ANCEF) IV     acetaminophen, diazepam, HYDROcodone-acetaminophen, nitroGLYCERIN  Exam: Gen obese, pleasant WF no distress No jvd or bruits Chest clear bilat to bases irreg irreg, slow rhythm,  no MRG Abd soft ntnd no mass or ascites +bs obese MS no joint effusion Ext no LE or UE edema / no wounds or ulcers Neuro is alert, Ox 3 , nf LUA AVF +bruit    Dialysis: TTS [facility] in [place]. Usual comes off at 214 lbs per pt    Assessment: 1  Bradycardia, symptomatic - awaiting PPM procedure 2  Chronic afib 3  ESRD TTS hd stable lytes 4  Vol no gross excess, stable 5  HTN  6  DM2 on insulin   Plan - HD today for yesterday, regular HD tomorrow   Kelly Splinter MD [place] pager [phone]   11/06/2016, 12:17 PM    Recent Labs Lab 11/05/16 1007  NA 133*  K 3.8  CL 96*  CO2 24  GLUCOSE 238*  BUN 58*  CREATININE 5.67*  CALCIUM 9.1    No results for input(s): AST, ALT, ALKPHOS, BILITOT, PROT, ALBUMIN in the last 168 hours.  Recent Labs Lab 11/05/16 1007  WBC 9.5  HGB 11.1*  HCT 36.2  MCV 95.5  PLT 191   Iron/TIBC/Ferritin/ %Sat    Component Value Date/Time   IRON 42 11/21/2015 0518   TIBC 300 11/21/2015 0518   FERRITIN 188 11/21/2015 0518   IRONPCTSAT 14 11/21/2015 0518

## 2016-11-06 NOTE — Plan of Care (Signed)
Problem: Education: Goal: Knowledge of [facility] Education information/materials will improve Outcome: Progressing Patient aware of plan for pacemaker. External pacer at bedside/in place. Patient has been asymptomatic.   Problem: Pain Managment: Goal: General experience of comfort will improve Outcome: Progressing Pain medication given PRN per eMAR. Patient rested through the evening.

## 2016-11-06 NOTE — Interval H&P Note (Signed)
History and Physical Interval Note:  11/06/2016 1:17 PM  Briana [name]  has presented today for surgery, with the diagnosis of bradycardia  The various methods of treatment have been discussed with the patient and family. After consideration of risks, benefits and other options for treatment, the patient has consented to  Procedure(s): Pacemaker Implant (N/A) as a surgical intervention .  The patient's history has been reviewed, patient examined, no change in status, stable for surgery.  I have reviewed the patient's chart and labs.  Questions were answered to the patient's satisfaction.     [name]

## 2016-11-06 NOTE — Progress Notes (Signed)
SUBJECTIVE: The patient is doing well today.  At this time, she denies chest pain, shortness of breath, or any new concerns.  Marland Kitchen aspirin EC  81 mg Oral Daily  . atorvastatin  80 mg Oral Daily  . calcium acetate  1,334 mg Oral TID WC  . chlorhexidine  60 mL Topical Once  . Chlorhexidine Gluconate Cloth  6 each Topical Daily  . ferric citrate  210 mg Oral TID WC  . gentamicin irrigation  80 mg Irrigation On Call  . insulin aspart  0-9 Units Subcutaneous TID WC  . insulin glargine  30 Units Subcutaneous QHS  . mouth rinse  15 mL Mouth Rinse BID  . mupirocin ointment  1 application Nasal BID  . protein supplement  1 scoop Oral TID WC  . rOPINIRole  0.25 mg Oral QHS  . sertraline  200 mg Oral Daily   .  ceFAZolin (ANCEF) IV      OBJECTIVE: Physical Exam: Vitals:   11/06/16 0646 11/06/16 0751 11/06/16 0802 11/06/16 1124  BP:  (!) 124/29 (!) 138/39 (!) 133/29  Pulse:  (!) 54  (!) 58  Resp:  18  (!) 22  Temp:  98.1 F (36.7 C)  98.4 F (36.9 C)  TempSrc:  Oral  Oral  SpO2: 91% 97%  91%  Weight:      Height:        Intake/Output Summary (Last 24 hours) at 11/06/16 1305 Last data filed at 11/06/16 0455  Gross per 24 hour  Intake              200 ml  Output              200 ml  Net                0 ml    Telemetry is reviewed b y myself: AFib, 30's-50's  GEN- The patient is well appearing, alert and oriented x 3 today.   Head- normocephalic, atraumatic Eyes-  Sclera clear, conjunctiva pink Ears- hearing intact Oropharynx- clear Neck- supple, no JVP Lungs- CTA b/l, normal work of breathing Heart- IRRR, bradycardic, no significant murmurs, no rubs or gallops GI- soft, NT, ND, + BS Extremities- no clubbing, cyanosis, or edema Skin- no rash or lesion Psych- euthymic mood, full affect Neuro- no gross deficits appreciated  LABS: Basic Metabolic Panel:  Recent Labs  11/05/16 1007  NA 133*  K 3.8  CL 96*  CO2 24  GLUCOSE 238*  BUN 58*  CREATININE 5.67*    CALCIUM 9.1   Liver Function Tests: No results for input(s): AST, ALT, ALKPHOS, BILITOT, PROT, ALBUMIN in the last 72 hours. No results for input(s): LIPASE, AMYLASE in the last 72 hours. CBC:  Recent Labs  11/05/16 1007  WBC 9.5  HGB 11.1*  HCT 36.2  MCV 95.5  PLT 191      ASSESSMENT AND PLAN:   1. Bradycardia     Symptomatic with change in her exertional capacity this weekend and increased fatigue     PPM planned for today     HR has been 30's-50's, BP has been OK  2. Permanent AFib     CHA2DS2Vasc is at least 5 on warfarin     INR today 2.29  3. ESRFon HD     T-TH-Sat schedule     appreciate nephrology, planned for post pacer dialysis today, and tomorrow  4. CAD     She has been managed conservatively of late  remains without anginal symptoms  5. DM     Will order SSI, appreciate diabetes RN            Tommye Standard, PA-C 11/06/2016 1:05 PM   EP Attending  Patient seen and examined. Agree with the findings as noted above. The patient remains in high grade heart block with rates in the 30's. Will plan to proceed with PM insertion today. I have discussed the risks/benefits/goals/expectations of the procedure with the patient and she wishes to proceed.  [name], M.D.

## 2016-11-06 NOTE — Progress Notes (Signed)
Inpatient Diabetes Program Recommendations  AACE/ADA: New Consensus Statement on Inpatient Glycemic Control (2015)  Target Ranges:  Prepandial:   less than 140 mg/dL      Peak postprandial:   less than 180 mg/dL (1-2 hours)      Critically ill patients:  140 - 180 mg/dL   Lab Results  Component Value Date   GLUCAP 169 (H) 11/06/2016   HGBA1C 8.6 07/10/2016    Review of Glycemic Control  Diabetes history: DM2 Outpatient Diabetes medications: 75/25 35 units am + 30 units pm Current orders for Inpatient glycemic control: 70/30 20 units bid  Inpatient Diabetes Program Recommendations:    Add Novolog 4 units tidwc Need updated HgbA1C  Will follow.  Thank you. Lorenda Peck, RD, LDN, CDE Inpatient Diabetes Coordinator [phone]

## 2016-11-07 ENCOUNTER — Inpatient Hospital Stay (HOSPITAL_COMMUNITY): Payer: Medicare Other

## 2016-11-07 ENCOUNTER — Encounter (HOSPITAL_COMMUNITY): Payer: Self-pay | Admitting: Internal Medicine

## 2016-11-07 LAB — PROTIME-INR
INR: 1.64
PROTHROMBIN TIME: 19.6 s — AB (ref 11.4–15.2)

## 2016-11-07 LAB — GLUCOSE, CAPILLARY
Glucose-Capillary: 186 mg/dL — ABNORMAL HIGH (ref 65–99)
Glucose-Capillary: 307 mg/dL — ABNORMAL HIGH (ref 65–99)
Glucose-Capillary: 108 mg/dL — ABNORMAL HIGH (ref 65–99)
Glucose-Capillary: 194 mg/dL — ABNORMAL HIGH (ref 65–99)

## 2016-11-07 LAB — RENAL FUNCTION PANEL
Albumin: 3 g/dL — ABNORMAL LOW (ref 3.5–5.0)
Anion gap: 10 (ref 5–15)
BUN: 40 mg/dL — ABNORMAL HIGH (ref 6–20)
CHLORIDE: 96 mmol/L — AB (ref 101–111)
CO2: 27 mmol/L (ref 22–32)
Calcium: 8.1 mg/dL — ABNORMAL LOW (ref 8.9–10.3)
Creatinine, Ser: 4.55 mg/dL — ABNORMAL HIGH (ref 0.44–1.00)
GFR calc non Af Amer: 9 mL/min — ABNORMAL LOW (ref >60)
GFR, EST AFRICAN AMERICAN: 10 mL/min — AB (ref >60)
Glucose, Bld: 285 mg/dL — ABNORMAL HIGH (ref 65–99)
POTASSIUM: 3.7 mmol/L (ref 3.5–5.1)
Phosphorus: 4 mg/dL (ref 2.5–4.6)
Sodium: 133 mmol/L — ABNORMAL LOW (ref 135–145)

## 2016-11-07 LAB — CBC
HEMATOCRIT: 29.9 % — AB (ref 36.0–46.0)
HEMOGLOBIN: 9.5 g/dL — AB (ref 12.0–15.0)
MCH: 30.1 pg (ref 26.0–34.0)
MCHC: 31.8 g/dL (ref 30.0–36.0)
MCV: 94.6 fL (ref 78.0–100.0)
PLATELETS: 167 10*3/uL (ref 150–400)
RBC: 3.16 MIL/uL — AB (ref 3.87–5.11)
RDW: 15.4 % (ref 11.5–15.5)
WBC: 7 10*3/uL (ref 4.0–10.5)

## 2016-11-07 MED ORDER — SODIUM CHLORIDE 0.9 % IV SOLN: 100.0000 mL | INTRAVENOUS | Status: DC | PRN

## 2016-11-07 MED ORDER — LIDOCAINE-PRILOCAINE 2.5-2.5 % EX CREA: 1.0000 "application " | TOPICAL_CREAM | CUTANEOUS | Status: DC | PRN

## 2016-11-07 MED ORDER — WARFARIN - PHYSICIAN DOSING INPATIENT: Freq: Every day | Status: DC

## 2016-11-07 MED ORDER — ALTEPLASE 2 MG IJ SOLR: 2.0000 mg | Freq: Once | INTRAMUSCULAR | Status: DC | PRN

## 2016-11-07 MED ORDER — HEPARIN SODIUM (PORCINE) 1000 UNIT/ML DIALYSIS: 1000.0000 [IU] | INTRAMUSCULAR | Status: DC | PRN

## 2016-11-07 MED ORDER — LIDOCAINE HCL (PF) 1 % IJ SOLN: 5.0000 mL | INTRAMUSCULAR | Status: DC | PRN

## 2016-11-07 MED ORDER — PENTAFLUOROPROP-TETRAFLUOROETH EX AERO: 1.0000 "application " | INHALATION_SPRAY | CUTANEOUS | Status: DC | PRN

## 2016-11-07 MED ORDER — WARFARIN SODIUM 2.5 MG PO TABS
2.5000 mg | ORAL_TABLET | Freq: Once | ORAL | Status: AC
  Administered 2016-11-07: 2.5 mg via ORAL
  Filled 2016-11-07: qty 1

## 2016-11-07 NOTE — Progress Notes (Signed)
Patient BP difficult to obtain on BLLE, taken on R forearm due to left limb restriction. SBP ranging 90-100's DBP in the 30's. Patient has remained asymptomatic, stating "I feel much better than I did earlier". Cardiology paged regarding continuous low DBP.

## 2016-11-07 NOTE — Progress Notes (Signed)
CRITICAL VALUE ALERT  Critical Value:  Post pacemaker CXR  Date & Time Notied:  11/07/2016 1300  Provider Notified: Tommye Standard  Orders Received/Actions taken: No new orders received.  Sanda Linger

## 2016-11-07 NOTE — Discharge Summary (Signed)
ELECTROPHYSIOLOGY PROCEDURE DISCHARGE SUMMARY    Patient ID: JENELLA CRAIGIE,  MRN: [id number], DOB/AGE: [birth year] 71 y.o.  Admit date: 11/05/2016 Discharge date: 11/08/16  Primary Care Physician: [name]  Primary Cardiologist: Dr. Burman Riis Electrophysiologist: Drt. [facility]  Primary Discharge Diagnosis:  1. Symptomatic bradycardia status post pacemaker implantation this admission 2. Permanent AFib     CHA2DS2Vasc is   Secondary Discharge Diagnosis:  1. CAD 2. 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 DM 4. PVD  Allergies  Allergen Reactions  . Tape Other (See Comments)    TAPE WILL CAUSE BLISTERS/SORES; PLEASE USE COBAN WRAP!!  . Gemfibrozil Other (See Comments)    Patient is not aware of this allergy but states that she had side effects to a cholesterol medication in the past  . Oxycodone Other (See Comments)    Hallucinations   . Carbamazepine Other (See Comments)    Reaction to tegretol - "loopy"  . Clonazepam Other (See Comments)    Sleepy   . Macrobid [Nitrofurantoin] Nausea And Vomiting     Procedures This Admission:  1.  Implantation of a MDT dual chamber PPM on 11/06/16 by Dr Lovena Le.  The patient received a Medtronic dual-chamber (serial number [id number]) pacemaker, Medtronic [id number] (serial number [id number]) right atrial /His bundle lead and a Medtronic model [id number] (serial number [id number]) right ventricular lead  There were no immediate post procedure complications. 2.  CXR on 11/07/16 demonstrated no pneumothorax status post device implantation.   Brief HPI: ELLICE BOULTINGHOUSE is a 71 y.o. female was brought to the ER 11/05/16 accompanied by her daughter.  She went to dialysis today and they found her HR too slow to proceed and recommended they call 911 for her but she declined and came in with family (rather then local ER).  The patient was found in AFib with SVR, 29-30's.   Hospital Course:  The patient was admitted to stepdown unit.   Recently she has had both  in and out patient visits for bradycardia.  Most recently last month during a hospital stay her metoprolol (12.4m BID dose) was held.  She evaluated by EP service (Dr. CCurt Bears and off her BB observed to have nocturnal brady with rates into the 30's, though daytime 50's-60's, ambulated with staff with observation of her HR to increase maintain 60's with once transient rate 47 without symptoms.  Give she was without symptoms of bradycardia, her increased infection risk with HD and the patient's preference to try and avoid pacing was recommended to stay off her BB and follow up out patient.  She the weekend prior to coming noted a clear change in her exertional capacity and much more fatigued then her baseline.  She has not had any kind of CP.  No syncope.  She confirmed not being on metoprolol at home since last admit.  With no reversible causes for her bradycardia she underwent implantation of a PPM with details as outlined above.  Post PPM she had HD, during which she became somewhat chilled, and overnight developed fever Tmax 103.1  she was treated with Tylenol and BC were drawn.  Her fever resolved and she felt much better the next morning.  Her WBC was and remained wnl.  She had her routine HD session Thursday.  Discussed with the patient and daughter felt she could discharge Thursday given no persistent fever after discussion, given the fever the prior evening, though the patient felt most comfortable staying an additional night, which was felt to be  reasonable as well.  She remained afebrile and is feeling very well this morning without complaints, of O2 this morning her O2 sats observed by myself was 98% room air.  She was monitored on telemetry throughout her stay post PPM remained AFib Vpacing, very brief episodes of intrinsic V rates 90's-110.  Right chest was without hematoma or ecchymosis.  The device was interrogated post procedure day and found to be functioning normally.  CXR was obtained and  demonstrated no pneumothorax status post device implantation, it did describes congestion and the patient had HD afterwards.  Wound care, arm mobility, and restrictions were reviewed with the patient.  Hgb A1c was drawn at the patient request and will follow up with her Endo, Dr. Dorris Fetch the results The patient was examined by Dr. Lovena Le, and considered stable for discharge to home. She had home health PT and RN services and these will be resumed.  We gave 1/2 dose of her warfarin last night to resume her usual dosing schedule today and have INR check next week.   Physical Exam: Vitals:   11/07/16 1951 11/08/16 0013 11/08/16 0607 11/08/16 0747  BP: (!) 128/55 (!) 98/38 (!) 108/53 (!) 129/58  Pulse: (!) 53 (!) 57 (!) 54 66  Resp: 16 (!) _0 Temp: 98.4 F (36.9 C) 98.6 F (37 C) 98.4 F (36.9 C) 98.9 F (37.2 C)  TempSrc: Oral Oral Oral Oral  SpO2: 99% 93% 94% 95%  Weight:   216 lb 9.6 oz (98.2 kg)   Height:       GEN- The patient is well appearing, alert and oriented x 3 today.   Head- normocephalic, atraumatic Eyes-  Sclera clear, conjunctiva pink Ears- hearing intact Oropharynx- clear Neck- supple, no JVP Lungs- CTA b/l, normal work of breathing Heart- RRR (paced), no significant murmurs, no rubs or gallops GI- soft, NT, ND Extremities- no clubbing, cyanosis, or edema Skin- no rash or lesion, Right chest (PPM) site is dry, no hematoma or ecchymosis. Psych- euthymic mood, full affect Neuro- no gross deficits appreciated  Labs:   Lab Results  Component Value Date   WBC 7.0 11/07/2016   HGB 9.5 (L) 11/07/2016   HCT 29.9 (L) 11/07/2016   MCV 94.6 11/07/2016   PLT 167 11/07/2016     Recent Labs Lab 11/07/16 1230  NA 133*  K 3.7  CL 96*  CO2 27  BUN 40*  CREATININE 4.55*  CALCIUM 8.1*  GLUCOSE 285*    Discharge Medications:  Allergies as of 11/08/2016      Reactions   Tape Other (See Comments)   TAPE WILL CAUSE BLISTERS/SORES; PLEASE USE COBAN WRAP!!    Gemfibrozil Other (See Comments)   Patient is not aware of this allergy but states that she had side effects to a cholesterol medication in the past   Oxycodone Other (See Comments)   Hallucinations   Carbamazepine Other (See Comments)   Reaction to tegretol - "loopy"   Clonazepam Other (See Comments)   Sleepy    Macrobid [nitrofurantoin] Nausea And Vomiting      Medication List    STOP taking these medications   enoxaparin 100 MG/ML injection Commonly known as:  LOVENOX     TAKE these medications   amLODipine 10 MG tablet Commonly known as:  NORVASC Take 10 mg by mouth daily.   aspirin 81 MG EC tablet Take 1 tablet (81 mg total) by mouth daily.   atorvastatin 80 MG tablet Commonly known as:  LIPITOR  Take 1 tablet (80 mg total) by mouth daily.   blood glucose meter kit and supplies Kit Dispense based on patient and insurance preference. Use up to four times daily as directed. (FOR ICD-10 E11.65)   calcium acetate 667 MG capsule Commonly known as:  PHOSLO Take 2 capsules (1,334 mg total) by mouth 3 (three) times daily with meals.   cetirizine 10 MG tablet Commonly known as:  ZYRTEC Take 10 mg by mouth daily.   diazepam 5 MG tablet Commonly known as:  VALIUM Take 5 mg by mouth 3 (three) times daily as needed for anxiety.   diclofenac 1.3 % Ptch Commonly known as:  FLECTOR Place 1 patch onto the skin 2 (two) times daily.   ferric citrate 1 GM 210 MG(Fe) tablet Commonly known as:  AURYXIA Take 210 mg by mouth 3 (three) times daily with meals.   HYDROcodone-acetaminophen 5-325 MG tablet Commonly known as:  NORCO/VICODIN Take 1 tablet by mouth every 6 (six) hours as needed for moderate pain (Must last 30 days.Do not take and drive a car or use machinery.).   Insulin Lispro Prot & Lispro (75-25) 100 UNIT/ML Kwikpen Commonly known as:  HUMALOG MIX 75/25 KWIKPEN Used 35 units with breakfast and 30 units with supper when pre-meal blood glucose is above 90 mg/dL.     nitroGLYCERIN 0.4 MG SL tablet Commonly known as:  NITROSTAT Place 1 tablet (0.4 mg total) under the tongue every 5 (five) minutes as needed for chest pain. Max three pills   polyethylene glycol packet Commonly known as:  MIRALAX / GLYCOLAX Take 17 g by mouth 2 (two) times daily. What changed:  when to take this  reasons to take this   protein supplement Powd Take 6 g by mouth 3 (three) times daily with meals.   rOPINIRole 0.25 MG tablet Commonly known as:  REQUIP Take 1 tablet (0.25 mg total) by mouth at bedtime.   sertraline 100 MG tablet Commonly known as:  ZOLOFT Take 200 mg by mouth daily.   Vitamin D (Ergocalciferol) 50000 units Caps capsule Commonly known as:  DRISDOL Take 50,000 Units by mouth every 7 (seven) days. THURSDAY   warfarin 5 MG tablet Commonly known as:  COUMADIN TAKE AS DIRECTED What changed:  See the new instructions. Notes to patient:  Resume at your current dosing schedule       Disposition: Home Discharge Instructions    Diet - low sodium heart healthy    Complete by:  As directed    Increase activity slowly    Complete by:  As directed      Follow-up Information    [facility] Follow up on 11/13/2016.   Specialty:  Cardiology Why:  3:00PM, coumadin check/lab draw (if not done with home health)  Contact information: [place]       [place] Office Follow up on 11/20/2016.   Specialty:  Cardiology Why:  4:00PM, wound check visit Contact information: [address] [place]       Evans Lance, MD Follow up on 02/14/2017.   Specialty:  Cardiology Why:  9:15AM Contact information: [place] [address] [phone]           Duration of Discharge Encounter: Greater than 30 minutes including physician time.  Venetia Night, PA-C 11/08/2016 10:21 AM  EP  Attending  Patient seen and examined. Agree with above.  Stable for DC home.  Mikle Bosworth.D.

## 2016-11-07 NOTE — Progress Notes (Signed)
SUBJECTIVE: The patient is feeling better this morning.  At this time, she denies chest pain, shortness of breath, or any new concerns.  Marland Kitchen aspirin EC  81 mg Oral Daily  . atorvastatin  80 mg Oral Daily  . calcium acetate  1,334 mg Oral TID WC  . Chlorhexidine Gluconate Cloth  6 each Topical Daily  . ferric citrate  210 mg Oral TID WC  . insulin aspart  0-9 Units Subcutaneous TID WC  . insulin glargine  30 Units Subcutaneous QHS  . mouth rinse  15 mL Mouth Rinse BID  . mupirocin ointment  1 application Nasal BID  . protein supplement  1 scoop Oral TID WC  . rOPINIRole  0.25 mg Oral QHS  . sertraline  200 mg Oral Daily   . sodium chloride    . sodium chloride    . sodium chloride    . sodium chloride      OBJECTIVE: Physical Exam: Vitals:   11/07/16 0400 11/07/16 0454 11/07/16 0610 11/07/16 0700  BP: (!) 92/33 (!) 93/43 (!) 112/48 (!) 125/49  Pulse:    65  Resp: (!) 22 (!) 21 (!) 22 18  Temp: 99.2 F (37.3 C)  98.2 F (36.8 C) 98 F (36.7 C)  TempSrc: Oral  Oral Oral  SpO2: 96% 99% 97% 100%  Weight:      Height:        Intake/Output Summary (Last 24 hours) at 11/07/16 1008 Last data filed at 11/07/16 0030  Gross per 24 hour  Intake               50 ml  Output             2700 ml  Net            -2650 ml    Telemetry AFib, V paced  GEN- The patient is well appearing, alert and oriented x 3 today.   Head- normocephalic, atraumatic Eyes-  Sclera clear, conjunctiva pink Ears- hearing intact Oropharynx- clear Neck- supple, no JVP Lungs- CTA b/l, normal work of breathing Heart- RRR (paced), no significant murmurs, no rubs or gallops GI- soft, NT, ND Extremities- no clubbing, cyanosis, or edema Skin- no rash or lesion Psych- euthymic mood, full affect Neuro- no gross deficits appreciated   LABS: Basic Metabolic Panel:  Recent Labs  11/05/16 1007 11/06/16 1730  NA 133* 134*  K 3.8 4.1  CL 96* 99*  CO2 24 23  GLUCOSE 238* 231*  BUN 58* 71*    CREATININE 5.67* 6.27*  CALCIUM 9.1 8.3*  PHOS  --  5.4*   Liver Function Tests:  Recent Labs  11/06/16 1730  ALBUMIN 3.2*   No results for input(s): LIPASE, AMYLASE in the last 72 hours. CBC:  Recent Labs  11/05/16 1007 11/06/16 1729  WBC 9.5 9.1  HGB 11.1* 9.7*  HCT 36.2 31.0*  MCV 95.5 94.5  PLT 191 167     ASSESSMENT AND PLAN:   1. Bradycardia Symptomatic with change in her exertional capacity this weekend and increased fatigue  s/p PPM yesterday     Device check this morning noted intact pacer function     CXR is pending  2. Permanent AFib CHA2DS2Vasc is at least 5 on warfarin INR today 1.64, will give 2.5 mg dose warfarin tonight then resume her usual dosing  3. ESRFon HD T-TH-Sat schedule appreciate nephrology, s/p HD yesterday post pacer, and recommended for usual HD today  Patient given overnight fever won't be discharging until afternoon  (if remains afebrile) and will not make her center HD     I have spoken to HD here, they will provide HD for the patient today.     Dr. Lovena Le would like if possible 1/2 dose or no heparin with HD today if possible      in discussion with HD charge RN, if they could reach out to nephrologist regarding reduced heparin, she said reducing/not using heparin for her session would not be a problem.    4. CAD She has been managed conservatively of late remains without anginal symptoms  5. DM Will order SSI, appreciate diabetes RN   6. Fever last PM     Not 2/2 implant so acutely     Patient began to feel some chills during HD last evening, T max overnight was 103 (R) received tylenol.     This AM is afebrile and feeling well.     somewhat perplexing with normal WBC yesterday as well.      BC were drawn though she has gotten post implant routine Ancef       Dr. Lovena Le saw/examined the patient this morning, plan is to monitor her here, if she remains feeling well and afebrile, will  likely discharge later this afternoon, after HD.        Tommye Standard, PA-C 11/07/2016 10:08 AM   EP Attending  Patient seen and examined. Agree with above. She is pending HD today. Home later today or in a.m.  Mikle Bosworth.D.

## 2016-11-07 NOTE — Progress Notes (Signed)
Inpatient Diabetes Program Recommendations  AACE/ADA: New Consensus Statement on Inpatient Glycemic Control (2015)  Target Ranges:  Prepandial:   less than 140 mg/dL      Peak postprandial:   less than 180 mg/dL (1-2 hours)      Critically ill patients:  140 - 180 mg/dL   Lab Results  Component Value Date   GLUCAP 307 (H) 11/07/2016   HGBA1C 8.6 07/10/2016    Review of Glycemic Control  Pt on 75/25 35 units QAM and 30 units QPM.  Inpatient Diabetes Program Recommendations:     Add Novolog 6 units tidwc for meal coverage insulin.  Thank you. Lorenda Peck, RD, LDN, CDE Inpatient Diabetes Coordinator [phone]

## 2016-11-07 NOTE — Progress Notes (Signed)
  [facility] Progress Note   Subjective: doing well after PPM yest, no c/o  Vitals:   11/07/16 0400 11/07/16 0454 11/07/16 0610 11/07/16 0700  BP: (!) 92/33 (!) 93/43 (!) 112/48 (!) 125/49  Pulse:    65  Resp: (!) 22 (!) 21 (!) 22 18  Temp: 99.2 F (37.3 C)  98.2 F (36.8 C) 98 F (36.7 C)  TempSrc: Oral  Oral Oral  SpO2: 96% 99% 97% 100%  Weight:      Height:        Inpatient medications: . aspirin EC  81 mg Oral Daily  . atorvastatin  80 mg Oral Daily  . calcium acetate  1,334 mg Oral TID WC  . Chlorhexidine Gluconate Cloth  6 each Topical Daily  . ferric citrate  210 mg Oral TID WC  . insulin aspart  0-9 Units Subcutaneous TID WC  . insulin glargine  30 Units Subcutaneous QHS  . mouth rinse  15 mL Mouth Rinse BID  . mupirocin ointment  1 application Nasal BID  . protein supplement  1 scoop Oral TID WC  . rOPINIRole  0.25 mg Oral QHS  . sertraline  200 mg Oral Daily   . sodium chloride    . sodium chloride    . sodium chloride    . sodium chloride     sodium chloride, sodium chloride, sodium chloride, sodium chloride, acetaminophen, alteplase, alteplase, diazepam, heparin, heparin, HYDROcodone-acetaminophen, lidocaine (PF), lidocaine (PF), lidocaine-prilocaine, nitroGLYCERIN, ondansetron (ZOFRAN) IV, pentafluoroprop-tetrafluoroeth  Exam: Gen obese, pleasant WF no distress No jvd or bruits Chest clear bilat to bases irreg irreg, slow rhythm,  no MRG Abd soft ntnd no mass or ascites +bs obese MS no joint effusion Ext no LE or UE edema / no wounds or ulcers Neuro is alert, Ox 3 , nf LUA AVF +bruit    Dialysis: TTS [facility] [address] in [place] 4h   98kg   2/2.5 bath  LUA AVF  Hep none - venofer 50/wk - no esa, vit D    Assessment: 1  Bradycardia, symptomatic - sp PPM 6/13 2  Chronic afib 3  ESRD TTS hd stable lytes 4  Vol no gross excess, stable, at dry wt 5  HTN  6  DM2 on insulin   Plan - HD today, no fluid off, possible dc  home after HD   Kelly Splinter MD [facility] pager [phone]   11/07/2016, 10:58 AM    Recent Labs Lab 11/05/16 1007 11/06/16 1730  NA 133* 134*  K 3.8 4.1  CL 96* 99*  CO2 24 23  GLUCOSE 238* 231*  BUN 58* 71*  CREATININE 5.67* 6.27*  CALCIUM 9.1 8.3*  PHOS  --  5.4*    Recent Labs Lab 11/06/16 1730  ALBUMIN 3.2*    Recent Labs Lab 11/05/16 1007 11/06/16 1729  WBC 9.5 9.1  HGB 11.1* 9.7*  HCT 36.2 31.0*  MCV 95.5 94.5  PLT 191 167   Iron/TIBC/Ferritin/ %Sat    Component Value Date/Time   IRON 42 11/21/2015 0518   TIBC 300 11/21/2015 0518   FERRITIN 188 11/21/2015 0518   IRONPCTSAT 14 11/21/2015 0518

## 2016-11-07 NOTE — Care Management Note (Signed)
Case Management Note  Patient Details  Name: [name] MRN: [id number] Date of Birth: Sep 29, 1945  Subjective/Objective: Pt presented for Symptomatic Bradycardia-S/p PPM. Pt is from home with husband and the plan will be to return home once stable. Pt has DME" [facility] and RW. Currently active with [facility] for RN/PT Services. Pt will need resumption orders and F2F   Action/Plan: [facility] is aware that pt is hospitalized. No further needs at this time.   Expected Discharge Date:                  Expected Discharge Plan:  [place]  In-House Referral:  NA  Discharge planning Services  CM Consult  Post Acute Care Choice:  Home Health, Resumption of Svcs/PTA Provider Choice offered to:  Patient  DME Arranged:  N/A DME Agency:  NA  HH Arranged:  RN, PT [place] Agency:  [place]  Status of Service:  In process, will continue to follow  If discussed at Long Length of Stay Meetings, dates discussed:    Additional Comments:  Bethena Roys, RN 11/07/2016, 11:26 AM

## 2016-11-07 NOTE — Progress Notes (Signed)
Dr. Hassell Done, cardiology covering paged regarding patient's temp. MD ordered to give Tylenol early and order Blood Cultures.

## 2016-11-08 LAB — GLUCOSE, CAPILLARY: Glucose-Capillary: 145 mg/dL — ABNORMAL HIGH (ref 65–99)

## 2016-11-08 LAB — PROTIME-INR
INR: 1.49
Prothrombin Time: 18.1 seconds — ABNORMAL HIGH (ref 11.4–15.2)

## 2016-11-08 NOTE — Discharge Instructions (Signed)
Supplemental Discharge Instructions for  Pacemaker/Defibrillator Patients  Activity No heavy lifting or vigorous activity with your left/right arm for 6 to 8 weeks.  Do not raise your left/right arm above your head for one week.  Gradually raise your affected arm as drawn below.             11/10/16                     11/11/16                    11/12/16                   11/13/16 __  NO DRIVING  for 1 week WOUND CARE - Keep the wound area clean and dry.  Do not get this area wet for one week. No showers for one week; you may shower on 11/13/16  . - The tape/steri-strips on your wound will fall off; do not pull them off.  No bandage is needed on the site.  DO  NOT apply any creams, oils, or ointments to the wound area. - If you notice any drainage or discharge from the wound, any swelling or bruising at the site, or you develop a fever > 101? F after you are discharged home, call the office at once.  Special Instructions - You are still able to use cellular telephones; use the ear opposite the side where you have your pacemaker/defibrillator.  Avoid carrying your cellular phone near your device. - When traveling through airports, show security personnel your identification card to avoid being screened in the metal detectors.  Ask the security personnel to use the hand wand. - Avoid arc welding equipment, MRI testing (magnetic resonance imaging), TENS units (transcutaneous nerve stimulators).  Call the office for questions about other devices. - Avoid electrical appliances that are in poor condition or are not properly grounded. - Microwave ovens are safe to be near or to operate.  Additional information for defibrillator patients should your device go off: - If your device goes off ONCE and you feel fine afterward, notify the device clinic nurses. - If your device goes off ONCE and you do not feel well afterward, call 911. - If your device goes off TWICE, call 911. - If your device goes  off THREE times in one day, call 911.  DO NOT DRIVE YOURSELF OR A FAMILY MEMBER WITH A DEFIBRILLATOR TO THE HOSPITAL--CALL 911.  Information on my medicine - Coumadin   (Warfarin)  This medication education was reviewed with me or my healthcare representative as part of my discharge preparation.    Why was Coumadin prescribed for you?--on coumadin prior to this hospital admission. Coumadin was prescribed for you because you have a blood clot or a medical condition that can cause an increased risk of forming blood clots. Blood clots can cause serious health problems by blocking the flow of blood to the heart, lung, or brain. Coumadin can prevent harmful blood clots from forming. As a reminder your indication for Coumadin is:   Stroke Prevention Because Of Atrial Fibrillation  What test will check on my response to Coumadin? While on Coumadin (warfarin) you will need to have an INR test regularly to ensure that your dose is keeping you in the desired range. The INR (international normalized ratio) number is calculated from the result of the laboratory test called prothrombin time (PT).  If an INR APPOINTMENT HAS NOT ALREADY BEEN  MADE FOR YOU please schedule an appointment to have this lab work done by your health care provider within 7 days. Your INR goal is usually a number between:  2 to 3 or your provider may give you a more narrow range like 2-2.5.  Ask your health care provider during an office visit what your goal INR is.  What  do you need to  know  About  COUMADIN? Take Coumadin (warfarin) exactly as prescribed by your healthcare provider about the same time each day.  DO NOT stop taking without talking to the doctor who prescribed the medication.  Stopping without other blood clot prevention medication to take the place of Coumadin may increase your risk of developing a new clot or stroke.  Get refills before you run out.  What do you do if you miss a dose? If you miss a dose, take it  as soon as you remember on the same day then continue your regularly scheduled regimen the next day.  Do not take two doses of Coumadin at the same time.  Important Safety Information A possible side effect of Coumadin (Warfarin) is an increased risk of bleeding. You should call your healthcare provider right away if you experience any of the following: ? Bleeding from an injury or your nose that does not stop. ? Unusual colored urine (red or dark brown) or unusual colored stools (red or black). ? Unusual bruising for unknown reasons. ? A serious fall or if you hit your head (even if there is no bleeding).  Some foods or medicines interact with Coumadin (warfarin) and might alter your response to warfarin. To help avoid this: ? Eat a balanced diet, maintaining a consistent amount of Vitamin K. ? Notify your provider about major diet changes you plan to make. ? Avoid alcohol or limit your intake to 1 drink for women and 2 drinks for men per day. (1 drink is 5 oz. wine, 12 oz. beer, or 1.5 oz. liquor.)  Make sure that ANY health care provider who prescribes medication for you knows that you are taking Coumadin (warfarin).  Also make sure the healthcare provider who is monitoring your Coumadin knows when you have started a new medication including herbals and non-prescription products.  Coumadin (Warfarin)  Major Drug Interactions  Increased Warfarin Effect Decreased Warfarin Effect  Alcohol (large quantities) Antibiotics (esp. Septra/Bactrim, Flagyl, Cipro) Amiodarone (Cordarone) Aspirin (ASA) Cimetidine (Tagamet) Megestrol (Megace) NSAIDs (ibuprofen, naproxen, etc.) Piroxicam (Feldene) Propafenone (Rythmol SR) Propranolol (Inderal) Isoniazid (INH) Posaconazole (Noxafil) Barbiturates (Phenobarbital) Carbamazepine (Tegretol) Chlordiazepoxide (Librium) Cholestyramine (Questran) Griseofulvin Oral Contraceptives Rifampin Sucralfate (Carafate) Vitamin K   Coumadin (Warfarin) Major  Herbal Interactions  Increased Warfarin Effect Decreased Warfarin Effect  Garlic Ginseng Ginkgo biloba Coenzyme Q10 Green tea St. Johns wort    Coumadin (Warfarin) FOOD Interactions  Eat a consistent number of servings per week of foods HIGH in Vitamin K (1 serving =  cup)  Collards (cooked, or boiled & drained) Kale (cooked, or boiled & drained) Mustard greens (cooked, or boiled & drained) Parsley *serving size only =  cup Spinach (cooked, or boiled & drained) Swiss chard (cooked, or boiled & drained) Turnip greens (cooked, or boiled & drained)  Eat a consistent number of servings per week of foods MEDIUM-HIGH in Vitamin K (1 serving = 1 cup)  Asparagus (cooked, or boiled & drained) Broccoli (cooked, boiled & drained, or raw & chopped) Brussel sprouts (cooked, or boiled & drained) *serving size only =  cup Lettuce, raw (green  leaf, endive, romaine) Spinach, raw Turnip greens, raw & chopped   These websites have more information on Coumadin (warfarin):  FailFactory.se; VeganReport.com.au;

## 2016-11-08 NOTE — Care Management Note (Signed)
Case Management Note  Patient Details  Name: Briana [name] MRN: [id number] Date of Birth: Oct 18, 1945  Subjective/Objective:    Pt presented for Symptomatic Bradycardia-S/p PPM. Pt is from home with husband and the plan will be to return home once stable. Pt has DME" [facility] and RW. Currently active with [facility] for RN/PT Services. Pt will need resumption orders and F2F   6/15 [place], BSN   - Patient is for dc today, with [facility] HHRN/PT, notified Jermaine with [facility] of dc.  Orders are in the system.   Patient 's daughter is here to transport her home.             Action/Plan: DC Home with [facility] HHRN/HHPT.  Expected Discharge Date:  11/08/16               Expected Discharge Plan:  [place]  In-House Referral:  NA  Discharge planning Services  CM Consult  Post Acute Care Choice:  Home Health, Resumption of Svcs/PTA Provider Choice offered to:  Patient  DME Arranged:  N/A DME Agency:  NA  HH Arranged:  RN, PT [place] Agency:  [place]  Status of Service:  Completed, signed off  If discussed at [place] of Stay Meetings, dates discussed:    Additional Comments:  Zenon Mayo, RN 11/08/2016, 10:50 AM

## 2016-11-08 NOTE — Care Management Important Message (Signed)
Important Message  Patient Details  Name: Briana [name] MRN: [id number] Date of Birth: 1946/04/12   Medicare Important Message Given:  Yes    Finnbar Cedillos [address] 11/08/2016, 1:11 PM

## 2016-11-09 LAB — HEMOGLOBIN A1C
Hgb A1c MFr Bld: 7.6 % — ABNORMAL HIGH (ref 4.8–5.6)
Mean Plasma Glucose: 171 mg/dL

## 2016-11-12 ENCOUNTER — Ambulatory Visit (INDEPENDENT_AMBULATORY_CARE_PROVIDER_SITE_OTHER): Payer: Medicare Other | Admitting: "Endocrinology

## 2016-11-12 ENCOUNTER — Encounter: Payer: Self-pay | Admitting: "Endocrinology

## 2016-11-12 VITALS — BP 137/71 | HR 78 | Ht 64.0 in | Wt 216.0 lb

## 2016-11-12 DIAGNOSIS — E782 Mixed hyperlipidemia: Secondary | ICD-10-CM | POA: Diagnosis not present

## 2016-11-12 DIAGNOSIS — E1122 Type 2 diabetes mellitus with diabetic chronic kidney disease: Secondary | ICD-10-CM | POA: Diagnosis not present

## 2016-11-12 DIAGNOSIS — I10 Essential (primary) hypertension: Secondary | ICD-10-CM

## 2016-11-12 DIAGNOSIS — I2510 Atherosclerotic heart disease of native coronary artery without angina pectoris: Secondary | ICD-10-CM

## 2016-11-12 DIAGNOSIS — N186 End stage renal disease: Secondary | ICD-10-CM

## 2016-11-12 LAB — CULTURE, BLOOD (ROUTINE X 2)
CULTURE: NO GROWTH
Culture: NO GROWTH
SPECIAL REQUESTS: ADEQUATE
SPECIAL REQUESTS: ADEQUATE

## 2016-11-12 MED ORDER — INSULIN LISPRO PROT & LISPRO (75-25 MIX) 100 UNIT/ML KWIKPEN: PEN_INJECTOR | SUBCUTANEOUS | 2 refills | Status: DC

## 2016-11-12 NOTE — Patient Instructions (Signed)

## 2016-11-12 NOTE — Progress Notes (Signed)
Subjective:    Patient ID: Briana [name], female    DOB: 25-Feb-1946,    Past Medical History:  Diagnosis Date  . Adenomatous colon polyp 09/2007  . Arthritis   . Atrial fibrillation with rapid ventricular response ([place]) 10/05/2015  . Bradycardia   . CAD (coronary artery disease)    a. s/p CABG 2005.  . Carotid artery disease ([place])    a. s/p R CEA 2011. Followed by vascular -  duplex 04/16/16: <51% RICA, 70-01% LICA.  Marland Kitchen Chronic anemia   . Chronic diastolic CHF (congestive heart failure) ([place])   . Depression with anxiety   . Diabetes mellitus   . ESRD (end stage renal disease) ([place])    T Th Sat  . Heart murmur   . Hyperlipidemia   . Hypertension   . Neuropathy   . PAD (peripheral artery disease) ([place])   . Paroxysmal atrial flutter ([place])   . Peripheral vascular disease ([place])   . Pulmonary hypertension ([place])   . Rheumatic fever   . Sleep apnea    Past Surgical History:  Procedure Laterality Date  . BASCILIC VEIN TRANSPOSITION Left 12/04/2015   Procedure: BASILIC VEIN TRANSPOSITION;  Surgeon: Rosetta Posner, MD;  Location: [facility];  Service: Vascular;  Laterality: Left;  . CAROTID ENDARTERECTOMY  10/04/2009   Right CEA  . CORONARY ARTERY BYPASS GRAFT  2005  . PACEMAKER IMPLANT N/A 11/06/2016   Procedure: Pacemaker Implant;  Surgeon: Evans Lance, MD;  Location: [facility];  Service: Cardiovascular;  Laterality: N/A;  . PERIPHERAL VASCULAR CATHETERIZATION N/A 06/14/2016   Procedure: A/V Fistulagram - Left Upper;  Surgeon: Elam Dutch, MD;  Location: [facility];  Service: Cardiovascular;  Laterality: N/A;  . POLYPECTOMY  02/25/2012   Procedure: POLYPECTOMY;  Surgeon: Danie Binder, MD;  Location: [facility];  Service: Endoscopy;  Laterality: N/A;  . TUBAL LIGATION     Social History   Social History  . Marital status: Married    Spouse name: N/A  . Number of children: N/A  . Years of education: N/A   Social History Main Topics  . Smoking status:  Never Smoker  . Smokeless tobacco: Never Used  . Alcohol use No  . Drug use: No  . Sexual activity: Not Asked   Other Topics Concern  . None   Social History Narrative  . None   Outpatient Encounter Prescriptions as of 11/12/2016  Medication Sig  . amLODipine (NORVASC) 10 MG tablet Take 10 mg by mouth daily.  Marland Kitchen aspirin EC 81 MG EC tablet Take 1 tablet (81 mg total) by mouth daily.  Marland Kitchen atorvastatin (LIPITOR) 80 MG tablet Take 1 tablet (80 mg total) by mouth daily.  . blood glucose meter kit and supplies KIT Dispense based on patient and insurance preference. Use up to four times daily as directed. (FOR ICD-10 E11.65)  . calcium acetate (PHOSLO) 667 MG capsule Take 2 capsules (1,334 mg total) by mouth 3 (three) times daily with meals.  . cetirizine (ZYRTEC) 10 MG tablet Take 10 mg by mouth daily.   . diazepam (VALIUM) 5 MG tablet Take 5 mg by mouth 3 (three) times daily as needed for anxiety.   . ferric citrate (AURYXIA) 1 GM 210 MG(Fe) tablet Take 210 mg by mouth 3 (three) times daily with meals.   Marland Kitchen HYDROcodone-acetaminophen (NORCO/VICODIN) 5-325 MG tablet Take 1 tablet by mouth every 6 (six) hours as needed for moderate pain (Must last  30 days.Do not take and drive a car or use machinery.).  . Insulin Lispro Prot & Lispro (HUMALOG MIX 75/25 KWIKPEN) (75-25) 100 UNIT/ML Kwikpen Used 30 units with breakfast and 30 units with supper when pre-meal blood glucose is above 90 mg/dL.  Marland Kitchen nitroGLYCERIN (NITROSTAT) 0.4 MG SL tablet Place 1 tablet (0.4 mg total) under the tongue every 5 (five) minutes as needed for chest pain. Max three pills  . polyethylene glycol (MIRALAX / GLYCOLAX) packet Take 17 g by mouth 2 (two) times daily. (Patient taking differently: Take 17 g by mouth daily as needed for mild constipation. )  . protein supplement (RESOURCE BENEPROTEIN) POWD Take 6 g by mouth 3 (three) times daily with meals.  Marland Kitchen rOPINIRole (REQUIP) 0.25 MG tablet Take 1 tablet (0.25 mg total) by mouth at  bedtime.  . sertraline (ZOLOFT) 100 MG tablet Take 200 mg by mouth daily.  . Vitamin D, Ergocalciferol, (DRISDOL) 50000 units CAPS capsule Take 50,000 Units by mouth every 7 (seven) days. THURSDAY  . warfarin (COUMADIN) 5 MG tablet TAKE AS DIRECTED (Patient taking differently: Take 5 mg by mouth once a day at 6 PM)  . [DISCONTINUED] diclofenac (FLECTOR) 1.3 % PTCH Place 1 patch onto the skin 2 (two) times daily. (Patient not taking: Reported on 11/05/2016)  . [DISCONTINUED] Insulin Lispro Prot & Lispro (HUMALOG MIX 75/25 KWIKPEN) (75-25) 100 UNIT/ML Kwikpen Used 35 units with breakfast and 30 units with supper when pre-meal blood glucose is above 90 mg/dL.   No facility-administered encounter medications on file as of 11/12/2016.    ALLERGIES: Allergies  Allergen Reactions  . Tape Other (See Comments)    TAPE WILL CAUSE BLISTERS/SORES; PLEASE USE COBAN WRAP!!  . Gemfibrozil Other (See Comments)    Patient is not aware of this allergy but states that she had side effects to a cholesterol medication in the past  . Oxycodone Other (See Comments)    Hallucinations   . Carbamazepine Other (See Comments)    Reaction to tegretol - "loopy"  . Clonazepam Other (See Comments)    Sleepy   . Macrobid [Nitrofurantoin] Nausea And Vomiting   VACCINATION STATUS: Immunization History  Administered Date(s) Administered  . Influenza,inj,Quad PF,36+ Mos 03/03/2014    Diabetes  She presents for her follow-up diabetic visit. She has type 2 diabetes mellitus. Onset time: She was diagnosed at approximate age of 42 years. Her disease course has been improving. There are no hypoglycemic associated symptoms. Pertinent negatives for hypoglycemia include no confusion, headaches, pallor or seizures. There are no diabetic associated symptoms. Pertinent negatives for diabetes include no chest pain, no fatigue, no polydipsia, no polyphagia and no polyuria. There are no hypoglycemic complications. Symptoms are  improving. Diabetic complications include nephropathy. Risk factors for coronary artery disease include dyslipidemia, diabetes mellitus, hypertension, obesity and sedentary lifestyle. Current diabetic treatment includes insulin injections. She is compliant with treatment most of the time. Her weight is stable. She is following a generally unhealthy diet. She has not had a previous visit with a dietitian (She declined dietitian visit.). She rarely participates in exercise. Her breakfast blood glucose range is generally 130-140 mg/dl. Her lunch blood glucose range is generally 140-180 mg/dl. Her dinner blood glucose range is generally 140-180 mg/dl. Her overall blood glucose range is 140-180 mg/dl. An ACE inhibitor/angiotensin II receptor blocker is being taken. Eye exam is current.  Hyperlipidemia  This is a chronic problem. The current episode started more than 1 year ago. Exacerbating diseases include diabetes and obesity.  Pertinent negatives include no chest pain, myalgias or shortness of breath. Current antihyperlipidemic treatment includes statins. Risk factors for coronary artery disease include diabetes mellitus, dyslipidemia, hypertension, obesity and a sedentary lifestyle.  Hypertension  This is a chronic problem. The current episode started more than 1 year ago. The problem is uncontrolled. Pertinent negatives include no chest pain, headaches, palpitations or shortness of breath. Past treatments include ACE inhibitors and calcium channel blockers. Hypertensive end-organ damage includes kidney disease.     Review of Systems  Constitutional: Negative for fatigue and unexpected weight change.  HENT: Negative for trouble swallowing and voice change.   Eyes: Negative for visual disturbance.  Respiratory: Negative for cough, shortness of breath and wheezing.   Cardiovascular: Negative for chest pain, palpitations and leg swelling.  Gastrointestinal: Negative for diarrhea, nausea and vomiting.   Endocrine: Negative for cold intolerance, heat intolerance, polydipsia, polyphagia and polyuria.  Musculoskeletal: Negative for arthralgias and myalgias.  Skin: Negative for color change, pallor, rash and wound.  Neurological: Negative for seizures and headaches.  Psychiatric/Behavioral: Negative for confusion and suicidal ideas.    Objective:    BP 137/71   Pulse 78   Ht '5\' 4"'  (1.626 m)   Wt 216 lb (98 kg)   BMI 37.08 kg/m   Wt Readings from Last 3 Encounters:  11/12/16 216 lb (98 kg)  11/08/16 216 lb 9.6 oz (98.2 kg)  10/16/16 215 lb (97.5 kg)    Physical Exam  Constitutional: She is oriented to person, place, and time. She appears well-developed.  HENT:  Head: Normocephalic and atraumatic.  Eyes: EOM are normal.  Neck: Normal range of motion. Neck supple. No tracheal deviation present. No thyromegaly present.  Cardiovascular: Normal rate and regular rhythm.   Pulmonary/Chest: Effort normal and breath sounds normal.  Abdominal: Soft. Bowel sounds are normal. There is no tenderness. There is no guarding.  Musculoskeletal: Normal range of motion. She exhibits no edema.  Neurological: She is alert and oriented to person, place, and time. She has normal reflexes. No cranial nerve deficit. Coordination normal.  Skin: Skin is warm and dry. No rash noted. No erythema. No pallor.  Psychiatric: She has a normal mood and affect. Judgment normal.    Results for orders placed or performed during the hospital encounter of 11/05/16  Surgical PCR screen  Result Value Ref Range   MRSA, PCR NEGATIVE NEGATIVE   Staphylococcus aureus POSITIVE (A) NEGATIVE  Culture, blood (routine x 2)  Result Value Ref Range   Specimen Description BLOOD RIGHT HAND    Special Requests      BOTTLES DRAWN AEROBIC ONLY Blood Culture adequate volume   Culture NO GROWTH 5 DAYS    Report Status 11/12/2016 FINAL   Culture, blood (routine x 2)  Result Value Ref Range   Specimen Description BLOOD RIGHT HAND     Special Requests      BOTTLES DRAWN AEROBIC ONLY Blood Culture adequate volume   Culture NO GROWTH 5 DAYS    Report Status 11/12/2016 FINAL   Basic metabolic panel  Result Value Ref Range   Sodium 133 (L) 135 - 145 mmol/L   Potassium 3.8 3.5 - 5.1 mmol/L   Chloride 96 (L) 101 - 111 mmol/L   CO2 24 22 - 32 mmol/L   Glucose, Bld 238 (H) 65 - 99 mg/dL   BUN 58 (H) 6 - 20 mg/dL   Creatinine, Ser 5.67 (H) 0.44 - 1.00 mg/dL   Calcium 9.1 8.9 - 10.3 mg/dL  GFR calc non Af Amer 7 (L) >60 mL/min   GFR calc Af Amer 8 (L) >60 mL/min   Anion gap 13 5 - 15  CBC  Result Value Ref Range   WBC 9.5 4.0 - 10.5 K/uL   RBC 3.79 (L) 3.87 - 5.11 MIL/uL   Hemoglobin 11.1 (L) 12.0 - 15.0 g/dL   HCT 36.2 36.0 - 46.0 %   MCV 95.5 78.0 - 100.0 fL   MCH 29.3 26.0 - 34.0 pg   MCHC 30.7 30.0 - 36.0 g/dL   RDW 15.2 11.5 - 15.5 %   Platelets 191 150 - 400 K/uL  Protime-INR  Result Value Ref Range   Prothrombin Time 25.6 (H) 11.4 - 15.2 seconds   INR 2.29   Glucose, capillary  Result Value Ref Range   Glucose-Capillary 239 (H) 65 - 99 mg/dL  Glucose, capillary  Result Value Ref Range   Glucose-Capillary 269 (H) 65 - 99 mg/dL  Glucose, capillary  Result Value Ref Range   Glucose-Capillary 197 (H) 65 - 99 mg/dL  Glucose, capillary  Result Value Ref Range   Glucose-Capillary 169 (H) 65 - 99 mg/dL  Glucose, capillary  Result Value Ref Range   Glucose-Capillary 159 (H) 65 - 99 mg/dL  CBC  Result Value Ref Range   WBC 9.1 4.0 - 10.5 K/uL   RBC 3.28 (L) 3.87 - 5.11 MIL/uL   Hemoglobin 9.7 (L) 12.0 - 15.0 g/dL   HCT 31.0 (L) 36.0 - 46.0 %   MCV 94.5 78.0 - 100.0 fL   MCH 29.6 26.0 - 34.0 pg   MCHC 31.3 30.0 - 36.0 g/dL   RDW 15.1 11.5 - 15.5 %   Platelets 167 150 - 400 K/uL  Renal function panel  Result Value Ref Range   Sodium 134 (L) 135 - 145 mmol/L   Potassium 4.1 3.5 - 5.1 mmol/L   Chloride 99 (L) 101 - 111 mmol/L   CO2 23 22 - 32 mmol/L   Glucose, Bld 231 (H) 65 - 99 mg/dL   BUN 71  (H) 6 - 20 mg/dL   Creatinine, Ser 6.27 (H) 0.44 - 1.00 mg/dL   Calcium 8.3 (L) 8.9 - 10.3 mg/dL   Phosphorus 5.4 (H) 2.5 - 4.6 mg/dL   Albumin 3.2 (L) 3.5 - 5.0 g/dL   GFR calc non Af Amer 6 (L) >60 mL/min   GFR calc Af Amer 7 (L) >60 mL/min   Anion gap 12 5 - 15  Protime-INR  Result Value Ref Range   Prothrombin Time 19.6 (H) 11.4 - 15.2 seconds   INR 1.64   Glucose, capillary  Result Value Ref Range   Glucose-Capillary 181 (H) 65 - 99 mg/dL  Glucose, capillary  Result Value Ref Range   Glucose-Capillary 186 (H) 65 - 99 mg/dL  Glucose, capillary  Result Value Ref Range   Glucose-Capillary 307 (H) 65 - 99 mg/dL  Renal function panel  Result Value Ref Range   Sodium 133 (L) 135 - 145 mmol/L   Potassium 3.7 3.5 - 5.1 mmol/L   Chloride 96 (L) 101 - 111 mmol/L   CO2 27 22 - 32 mmol/L   Glucose, Bld 285 (H) 65 - 99 mg/dL   BUN 40 (H) 6 - 20 mg/dL   Creatinine, Ser 4.55 (H) 0.44 - 1.00 mg/dL   Calcium 8.1 (L) 8.9 - 10.3 mg/dL   Phosphorus 4.0 2.5 - 4.6 mg/dL   Albumin 3.0 (L) 3.5 - 5.0 g/dL   GFR  calc non Af Amer 9 (L) >60 mL/min   GFR calc Af Amer 10 (L) >60 mL/min   Anion gap 10 5 - 15  CBC  Result Value Ref Range   WBC 7.0 4.0 - 10.5 K/uL   RBC 3.16 (L) 3.87 - 5.11 MIL/uL   Hemoglobin 9.5 (L) 12.0 - 15.0 g/dL   HCT 29.9 (L) 36.0 - 46.0 %   MCV 94.6 78.0 - 100.0 fL   MCH 30.1 26.0 - 34.0 pg   MCHC 31.8 30.0 - 36.0 g/dL   RDW 15.4 11.5 - 15.5 %   Platelets 167 150 - 400 K/uL  Protime-INR  Result Value Ref Range   Prothrombin Time 18.1 (H) 11.4 - 15.2 seconds   INR 1.49   Glucose, capillary  Result Value Ref Range   Glucose-Capillary 108 (H) 65 - 99 mg/dL  Glucose, capillary  Result Value Ref Range   Glucose-Capillary 194 (H) 65 - 99 mg/dL  Glucose, capillary  Result Value Ref Range   Glucose-Capillary 145 (H) 65 - 99 mg/dL  Hemoglobin A1c  Result Value Ref Range   Hgb A1c MFr Bld 7.6 (H) 4.8 - 5.6 %   Mean Plasma Glucose 171 mg/dL  I-stat troponin, ED   Result Value Ref Range   Troponin i, poc 0.05 0.00 - 0.08 ng/mL   Comment 3           Diabetic Labs (most recent): Lab Results  Component Value Date   HGBA1C 7.6 (H) 11/08/2016   HGBA1C 8.6 07/10/2016   HGBA1C 6.9 (H) 09/15/2015       Assessment & Plan:   1. Type 2 diabetes mellitus with  ESRD on HD, CAD, with long-term current use of insulin (HCC)  -Her diabetes is  complicated by chronic kidney disease and patient remains at a high risk for more acute and chronic complications of diabetes which include CAD, CVA, CKD, retinopathy, and neuropathy. These are all discussed in detail with the patient.  - She missed her appointments since February 2017. In the interval, she developed end-stage renal disease currently on dialysis.   - Her A1c has improved to 7.6% from 8.6%.   - She came with Her logs in meter showing improved blood glucose profile, however still above target postprandial.  - I have re-counseled the patient on diet management and weight loss  by adopting a carbohydrate restricted / protein rich  Diet.  - Suggestion is made for patient to avoid simple carbohydrates   from their diet including Cakes , Desserts, Ice Cream,  Soda (  diet and regular) , Sweet Tea , Candies,  Chips, Cookies, Artificial Sweeteners,   and "Sugar-free" Products .  This will help patient to have stable blood glucose profile and potentially avoid unintended  Weight gain.  - Patient is advised to stick to a routine mealtimes to eat 3 meals  a day and avoid unnecessary snacks ( to snack only to correct hypoglycemia).  - The patient has declined a visit with CDE for individualized DM education.  - I have approached patient with the following individualized plan to manage diabetes and patient agrees.  - I advised her to continue Humalog 75/25  30  units with breakfast and 30 units with supper when glucose is above 90, associated with strict monitoring of glucose AC and HS.  -Patient is warned not  to take insulin without proper monitoring per orders.  -Patient is encouraged to call clinic for blood glucose levels less than 70  or above 300 mg /dl. - She once again has declined referral to CDE. -Patient is not a candidate for MTF, SGLT2 i due to CKD. - Insulin is the  exclusive safe choice of therapy for her at this time.  - Patient specific target  for A1c; LDL, HDL, Triglycerides, and  Waist Circumference were discussed in detail.  2) BP/HTN: uncontrolled,advised her to Continue current medications including ACEI/ARB. 3) Lipids/HPL:  continue statins. 4)  Weight/Diet: declined CDE consult, exercise, and carbohydrates information provided.   5. Vitamin D deficiency -I advised her to continue with vitamin D 2000 units daily.  6) Chronic Care/Health Maintenance:  -Patient is on ACEI/ARB and Statin medications and encouraged to continue to follow up with Ophthalmology, Podiatrist at least yearly or according to recommendations, and advised to  stay away from smoking. I have recommended yearly flu vaccine and pneumonia vaccination at least every 5 years;  and  sleep for at least 7 hours a day.  I advised patient to maintain close follow up with their PCP for primary care needs.  Patient is asked to bring meter and  blood glucose logs during her next visit.   Follow up plan: Return in about 6 months (around 05/14/2017) for meter, and logs, do labs in 3 months .  Glade Lloyd, MD Phone: [phone]  Fax: [phone]   11/12/2016, 4:55 PM

## 2016-11-13 ENCOUNTER — Telehealth: Payer: Self-pay | Admitting: *Deleted

## 2016-11-13 ENCOUNTER — Ambulatory Visit (INDEPENDENT_AMBULATORY_CARE_PROVIDER_SITE_OTHER): Payer: Medicare Other | Admitting: *Deleted

## 2016-11-13 LAB — POCT INR: INR: 1.9

## 2016-11-13 NOTE — Telephone Encounter (Signed)
INR 1.9 per [name] [phone]  Pt had pacemaker placed

## 2016-11-13 NOTE — Telephone Encounter (Signed)
Done.  See coumadin note. 

## 2016-11-14 ENCOUNTER — Ambulatory Visit: Payer: Medicare Other | Admitting: Internal Medicine

## 2016-11-18 ENCOUNTER — Telehealth: Payer: Self-pay

## 2016-11-18 NOTE — Telephone Encounter (Signed)
Briana [name] , [facility] noted some slight crackles at left lung base at visit today.will recheck on Wednesday, although pt tells her she has a wound check for pacer that day. I see on schedule her apt is Friday, 11/22/16.She is going to double check that date with pt

## 2016-11-20 ENCOUNTER — Ambulatory Visit: Payer: Medicare Other

## 2016-11-20 ENCOUNTER — Ambulatory Visit (INDEPENDENT_AMBULATORY_CARE_PROVIDER_SITE_OTHER): Payer: Medicare Other | Admitting: *Deleted

## 2016-11-20 LAB — POCT INR: INR: 6.5

## 2016-11-21 ENCOUNTER — Ambulatory Visit (HOSPITAL_COMMUNITY): Payer: Medicare Other

## 2016-11-22 ENCOUNTER — Telehealth: Payer: Self-pay | Admitting: Orthopaedic Surgery

## 2016-11-22 ENCOUNTER — Encounter: Payer: Self-pay | Admitting: Internal Medicine

## 2016-11-22 ENCOUNTER — Ambulatory Visit (INDEPENDENT_AMBULATORY_CARE_PROVIDER_SITE_OTHER): Payer: Medicare Other | Admitting: *Deleted

## 2016-11-22 DIAGNOSIS — I495 Sick sinus syndrome: Secondary | ICD-10-CM | POA: Diagnosis not present

## 2016-11-22 NOTE — Telephone Encounter (Signed)
Patient requests refill on Hydrocodone/Acetaminophen 5-325 mgs.  Qty  100  Sig: Take 1 tablet by mouth every 6 (six) hours as needed for moderate pain (Must last 30 days.Do not take and drive a car or use machinery.).

## 2016-11-22 NOTE — Progress Notes (Signed)
Wound check appointment. Steri-strips removed. Wound without edema. Incision edges approximated, wound well healed. Small red area noted above incision line. Pt and pt's voiced understanding to call if the area doesn't improve or worsens.  Normal device function. RA (HIS) lead septal capture until loss. Thresholds, sensing, and impedances consistent with implant measurements. Device programmed at 3.5V programmed on for extra safety margin until 3 month visit. Histogram distribution appropriate for patient and level of activity. No high ventricular rates noted. Patient educated about wound care, arm mobility, lifting restrictions. ROV 02/14/2017.

## 2016-11-25 ENCOUNTER — Ambulatory Visit (HOSPITAL_COMMUNITY)
Admission: RE | Admit: 2016-11-25 | Discharge: 2016-11-25 | Disposition: A | Discharge status: Home / Self Care | Payer: Medicare Other | Source: Ambulatory Visit | Attending: Vascular Surgery | Admitting: Vascular Surgery

## 2016-11-25 ENCOUNTER — Ambulatory Visit (INDEPENDENT_AMBULATORY_CARE_PROVIDER_SITE_OTHER): Payer: Medicare Other | Admitting: *Deleted

## 2016-11-25 DIAGNOSIS — I6523 Occlusion and stenosis of bilateral carotid arteries: Secondary | ICD-10-CM | POA: Diagnosis not present

## 2016-11-25 DIAGNOSIS — R590 Localized enlarged lymph nodes: Secondary | ICD-10-CM | POA: Diagnosis not present

## 2016-11-25 DIAGNOSIS — J984 Other disorders of lung: Secondary | ICD-10-CM | POA: Insufficient documentation

## 2016-11-25 DIAGNOSIS — I6521 Occlusion and stenosis of right carotid artery: Secondary | ICD-10-CM | POA: Insufficient documentation

## 2016-11-25 LAB — POCT INR: INR: 1.4

## 2016-11-25 MED ORDER — IOPAMIDOL (ISOVUE-370) INJECTION 76%
75.0000 mL | Freq: Once | INTRAVENOUS | Status: AC | PRN
  Administered 2016-11-25: 75 mL via INTRAVENOUS

## 2016-11-26 ENCOUNTER — Ambulatory Visit: Payer: Medicare Other | Admitting: Vascular Surgery

## 2016-11-29 ENCOUNTER — Encounter: Payer: Self-pay | Admitting: Vascular Surgery

## 2016-12-02 ENCOUNTER — Telehealth: Payer: Self-pay | Admitting: Cardiovascular Disease

## 2016-12-02 ENCOUNTER — Ambulatory Visit (INDEPENDENT_AMBULATORY_CARE_PROVIDER_SITE_OTHER): Payer: Medicare Other | Admitting: *Deleted

## 2016-12-02 LAB — POCT INR: INR: 3

## 2016-12-02 MED ORDER — HYDROCODONE-ACETAMINOPHEN 5-325 MG PO TABS: 1.0000 | ORAL_TABLET | Freq: Four times a day (QID) | ORAL | 0 refills | Status: DC | PRN

## 2016-12-02 NOTE — Telephone Encounter (Signed)
I spoke  With [name] and relayed message

## 2016-12-02 NOTE — Telephone Encounter (Signed)
Briana [name] w/ [facility] wanted Dr. Bronson Ing to be aware that the pt's heart rate was 100 and regular. [phone]

## 2016-12-02 NOTE — Telephone Encounter (Signed)
I will forward to Dr Koneswaran 

## 2016-12-02 NOTE — Telephone Encounter (Signed)
Noted. She has a pacemaker.

## 2016-12-09 ENCOUNTER — Ambulatory Visit (INDEPENDENT_AMBULATORY_CARE_PROVIDER_SITE_OTHER): Payer: Medicare Other | Admitting: *Deleted

## 2016-12-09 LAB — POCT INR: INR: 2.9

## 2016-12-10 ENCOUNTER — Ambulatory Visit (INDEPENDENT_AMBULATORY_CARE_PROVIDER_SITE_OTHER): Payer: Medicare Other | Admitting: Vascular Surgery

## 2016-12-10 ENCOUNTER — Encounter: Payer: Self-pay | Admitting: Vascular Surgery

## 2016-12-10 VITALS — BP 128/54 | HR 80 | Temp 98.1°F | Resp 16 | Ht 64.0 in | Wt 214.0 lb

## 2016-12-10 DIAGNOSIS — I6522 Occlusion and stenosis of left carotid artery: Secondary | ICD-10-CM

## 2016-12-10 DIAGNOSIS — I6523 Occlusion and stenosis of bilateral carotid arteries: Secondary | ICD-10-CM | POA: Diagnosis not present

## 2016-12-10 NOTE — Progress Notes (Signed)
Vascular and Vein Specialist of [facility]  Patient name: Briana [name] MRN: [id number] DOB: 1945-10-04 Sex: female  REASON FOR VISIT: carotid follow-up  HPI:    Briana [name] is a 71 y.o. female who has a remote history of right carotid endarterectomy by Dr. Kellie Simmering and left carotid stenosis. She was last seen in the office on 04/16/2016. Given the depth and extensive calcified plaque of her carotids, her carotid duplex was felt to be a poor study. She was advised to follow up in 6 months with a CT angiogram for further visualization. She denies any episodes of amaurosis fugax, sudden onset weakness or numbness of the extremities or slurred speech.  She underwent recent pacemaker placement in June 2018 for bradycardia. She is on Coumadin for atrial fibrillation. She is on dialysis via her left upper arm fistula on Tuesdays, Thursdays and Saturdays.   Has a past medical history of CAD status post CABG in 2005. Her cardiologist is Dr. Bronson Ing. Has history of hypertension, diabetes mellitus and congestive heart failure. She has never been a smoker.   PAST MEDICAL HISTORY:   Past Medical History:  Diagnosis Date  . Adenomatous colon polyp 09/2007  . Arthritis   . Atrial fibrillation with rapid ventricular response ([place]) 10/05/2015  . Bradycardia   . CAD (coronary artery disease)    a. s/p CABG 2005.  . Carotid artery disease ([place])    a. s/p R CEA 2011. Followed by vascular -  duplex 04/16/16: <22% RICA, 97-98% LICA.  Marland Kitchen Chronic anemia   . Chronic diastolic CHF (congestive heart failure) ([place])   . Depression with anxiety   . Diabetes mellitus   . ESRD (end stage renal disease) ([place])    T Th Sat  . Heart murmur   . Hyperlipidemia   . Hypertension   . Neuropathy   . PAD (peripheral artery disease) ([place])   . Paroxysmal atrial flutter ([place])   . Peripheral vascular disease ([place])   . Pulmonary hypertension ([place])   . Rheumatic fever   . Sleep apnea     Family History    Problem Relation Age of Onset  . Breast cancer Mother   . Heart disease Father   . Diabetes Father   . Heart disease Brother     Social History  Substance Use Topics  . Smoking status: Never Smoker  . Smokeless tobacco: Never Used  . Alcohol use No    Allergies  Allergen Reactions  . Tape Other (See Comments)    TAPE WILL CAUSE BLISTERS/SORES; PLEASE USE COBAN WRAP!!  . Gemfibrozil Other (See Comments)    Patient is not aware of this allergy but states that she had side effects to a cholesterol medication in the past  . Oxycodone Other (See Comments)    Hallucinations   . Carbamazepine Other (See Comments)    Reaction to tegretol - "loopy"  . Clonazepam Other (See Comments)    Sleepy   . Macrobid [Nitrofurantoin] Nausea And Vomiting    MEDICATIONS:   Current Outpatient Prescriptions  Medication Sig Dispense Refill  . amLODipine (NORVASC) 10 MG tablet Take 10 mg by mouth daily.    Marland Kitchen aspirin EC 81 MG EC tablet Take 1 tablet (81 mg total) by mouth daily. 30 tablet 1  . atorvastatin (LIPITOR) 80 MG tablet Take 1 tablet (80 mg total) by mouth daily. 30 tablet 3  . blood glucose meter kit and supplies KIT Dispense based on patient and insurance preference. Use up  to four times daily as directed. (FOR ICD-10 E11.65) 1 each 0  . calcium acetate (PHOSLO) 667 MG capsule Take 2 capsules (1,334 mg total) by mouth 3 (three) times daily with meals. 180 capsule 1  . cetirizine (ZYRTEC) 10 MG tablet Take 10 mg by mouth daily.     . diazepam (VALIUM) 5 MG tablet Take 5 mg by mouth 3 (three) times daily as needed for anxiety.     . enoxaparin (LOVENOX) 100 MG/ML injection     . ferric citrate (AURYXIA) 1 GM 210 MG(Fe) tablet Take 210 mg by mouth 3 (three) times daily with meals.     Marland Kitchen HYDROcodone-acetaminophen (NORCO/VICODIN) 5-325 MG tablet Take 1 tablet by mouth every 6 (six) hours as needed for moderate pain (Must last 30 days.Do not take and drive a car or use machinery.). 75 tablet  0  . Insulin Lispro Prot & Lispro (HUMALOG MIX 75/25 KWIKPEN) (75-25) 100 UNIT/ML Kwikpen Used 30 units with breakfast and 30 units with supper when pre-meal blood glucose is above 90 mg/dL. 10 pen 2  . nitroGLYCERIN (NITROSTAT) 0.4 MG SL tablet Place 1 tablet (0.4 mg total) under the tongue every 5 (five) minutes as needed for chest pain. Max three pills 45 tablet 0  . polyethylene glycol (MIRALAX / GLYCOLAX) packet Take 17 g by mouth 2 (two) times daily. (Patient taking differently: Take 17 g by mouth daily as needed for mild constipation. ) 14 each 0  . protein supplement (RESOURCE BENEPROTEIN) POWD Take 6 g by mouth 3 (three) times daily with meals.  0  . rOPINIRole (REQUIP) 0.25 MG tablet Take 1 tablet (0.25 mg total) by mouth at bedtime. 30 tablet 3  . sertraline (ZOLOFT) 100 MG tablet Take 200 mg by mouth daily.    . Vitamin D, Ergocalciferol, (DRISDOL) 50000 units CAPS capsule Take 50,000 Units by mouth every 7 (seven) days. THURSDAY    . warfarin (COUMADIN) 5 MG tablet TAKE AS DIRECTED (Patient taking differently: Take 5 mg by mouth once a day at 6 PM) 90 tablet 3   No current facility-administered medications for this visit.     REVIEW OF SYSTEMS:   REVIEW OF SYSTEMS (negative unless checked):   Cardiac:  _0  Chest pain or chest pressure? _1  Shortness of breath upon activity? _2  Shortness of breath when lying flat? _3  Irregular heart rhythm?  Vascular:  _4  Pain in calf, thigh, or hip brought on by walking? _5  Pain in feet at night that wakes you up from your sleep? _6  Blood clot in your veins? _7  Leg swelling?  Pulmonary:  _8  Oxygen at home? _9  Productive cough? _10  Wheezing?  Neurologic:  _11  Sudden weakness in arms or legs? _12  Sudden numbness in arms or legs? _13  Sudden onset of difficult speaking or slurred speech? _14  Temporary loss of vision in one eye? _15  Problems with dizziness?  Gastrointestinal:  _16  Blood in stool? _17  Vomited blood?  Genitourinary:  _18   Burning when urinating? _19  Blood in urine?  Psychiatric:  _20  Major depression  Hematologic:  _21  Bleeding problems? _22  Problems with blood clotting?  Dermatologic:  _23  Rashes or ulcers?  Constitutional:  _24  Fever or chills?  Ear/Nose/Throat:  _25  Change in hearing? _26  Nose bleeds? _27  Sore throat?  Musculoskeletal:  _28  Back pain? _29  Joint pain? _30  Muscle pain?  PHYSICAL EXAM:   Vitals:   12/10/16 1553  BP: (!) 128/54  Pulse: 80  Resp: 16  Temp: 98.1 F (36.7 C)  SpO2: 90%  Weight: 214 lb (97.1 kg)  Height: _0  (1.626 m)    GENERAL: The patient is a well-nourished female, in no acute distress. The vital signs are documented above. HEENT: normocephalic, atraumatic. No abnormalities noted.  CARDIAC: There is a regular rate and rhythm. Bilateral carotid bruits. VASCULAR: Non-palpable left radial pulse. 2+ right radial pulse. Left upper arm fistula.  PULMONARY: There is good air exchange bilaterally without wheezing or rales. MUSCULOSKELETAL: There are no major deformities or cyanosis. NEUROLOGIC: No focal deficits. 5 out of 5 strength upper and lower external nares bilaterally. SKIN: There are no ulcers or rashes noted. PSYCHIATRIC: The patient has a normal affect.  DATA:    CTA neck 11/25/2016  Right carotid endarterectomy is patent. Critical greater than 90% stenosis left internal carotid artery.  ASSESSMENT/PLAN:   Asymptomatic high-grade left internal carotid artery stenosis  Discussed that the annual risk of stroke is 5% per year. Recommended left carotid endarterectomy for stroke prevention. She has been appointment tomorrow with Dr. Bronson Ing for follow-up after her pacemaker placement. Dr. Donnetta Hutching to contact Dr. Bronson Ing regarding any need for bridging with her coumadin and pre-operative cardiac evaluation. Discussed risk of bleeding, infection, cranial nerve damage, and stroke associated with procedure. The patient is wanting to get her surgery soon as  possible. This will be scheduled at the patient's convenience.  Virgina Jock, PA-C [facility]ascular and Vein Specialists of [facility] MD: Early  I have examined the patient, reviewed and agree with above.Discussed CT angiogram with the patient and her daughter present. Shows critical left carotid stenosis. Patient is right handed. She has extensive calcification therefore be a poor stent candidate. She has had recent pacemaker placement for bradycardia. She is to follow-up with cardiology tomorrow and will explained the need for upcoming carotid surgery for cardiac clearance. Also will discuss any need for bridging for Coumadin versus simply discontinue the several days prior to procedure related to her atrial fibrillation. Discussed procedure including risk of stroke with surgery and also cranial nerve injury. Patient and her daughter wish to proceed as soon as possible  Curt Jews, MD 12/10/2016 4:41 PM

## 2016-12-11 ENCOUNTER — Ambulatory Visit (INDEPENDENT_AMBULATORY_CARE_PROVIDER_SITE_OTHER): Payer: Medicare Other | Admitting: Cardiovascular Disease

## 2016-12-11 ENCOUNTER — Ambulatory Visit (INDEPENDENT_AMBULATORY_CARE_PROVIDER_SITE_OTHER): Payer: Medicare Other | Admitting: *Deleted

## 2016-12-11 ENCOUNTER — Other Ambulatory Visit: Payer: Self-pay

## 2016-12-11 ENCOUNTER — Encounter: Payer: Self-pay | Admitting: Cardiovascular Disease

## 2016-12-11 ENCOUNTER — Telehealth: Payer: Self-pay | Admitting: *Deleted

## 2016-12-11 VITALS — BP 152/70 | HR 94 | Ht 64.0 in | Wt 216.0 lb

## 2016-12-11 DIAGNOSIS — I6523 Occlusion and stenosis of bilateral carotid arteries: Secondary | ICD-10-CM | POA: Diagnosis not present

## 2016-12-11 DIAGNOSIS — I4821 Permanent atrial fibrillation: Secondary | ICD-10-CM

## 2016-12-11 DIAGNOSIS — I5032 Chronic diastolic (congestive) heart failure: Secondary | ICD-10-CM

## 2016-12-11 DIAGNOSIS — N186 End stage renal disease: Secondary | ICD-10-CM | POA: Diagnosis not present

## 2016-12-11 DIAGNOSIS — Z950 Presence of cardiac pacemaker: Secondary | ICD-10-CM | POA: Diagnosis not present

## 2016-12-11 DIAGNOSIS — I482 Chronic atrial fibrillation: Secondary | ICD-10-CM

## 2016-12-11 DIAGNOSIS — I209 Angina pectoris, unspecified: Secondary | ICD-10-CM

## 2016-12-11 DIAGNOSIS — Z01818 Encounter for other preprocedural examination: Secondary | ICD-10-CM

## 2016-12-11 DIAGNOSIS — I25708 Atherosclerosis of coronary artery bypass graft(s), unspecified, with other forms of angina pectoris: Secondary | ICD-10-CM | POA: Diagnosis not present

## 2016-12-11 DIAGNOSIS — Z992 Dependence on renal dialysis: Secondary | ICD-10-CM

## 2016-12-11 MED ORDER — ENOXAPARIN SODIUM 100 MG/ML ~~LOC~~ SOLN: 100.0000 mg | SUBCUTANEOUS | 0 refills | Status: DC

## 2016-12-11 NOTE — Patient Instructions (Addendum)
Pt is scheduled for a Lt CEA on 12/18/16 by Vascular and Vein.  Today's Wt  98kg  11/07/16  H/H: 9.5/29.9      SrCr 4.5 Lovenox dosed per renal protocol.  7/21  Lovenox 100mg  sq @ 6am 7/22  Lovenox 100mg  sq @ 6am 7/23  Lovenox 100mg  sq  @ 6am 7/24  Lovenox 100mg  sq @ 6am 7/25  No lovenox-------Procedure-----hospital stay 7/26  Hospital to home   Take coumadin 7.5mg  @ 6pm 7/27  Lovenox 100mg  sq 6am and coumadin 7.5mg  @ 6pm 7/28  Lovenox 100mg  sq 6am and coumadin 7.5mg  @ 6pm 7/29  Lovenox 100mg  sq 6am and coumadin 5mg  @ 6pm 7/30  Lovenox 100mg  sq 6am and INR check by [facility] with results to me

## 2016-12-11 NOTE — Patient Instructions (Signed)
Medication Instructions:  Your physician recommends that you continue on your current medications as directed. Please refer to the Current Medication list given to you today.   Labwork: none  Testing/Procedures: none  Follow-Up: Your physician wants you to follow-up in: 6 months,  You will receive a reminder letter in the mail two months in advance. If you don't receive a letter, please call our office to schedule the follow-up appointment.   Any Other Special Instructions Will Be Listed Below (If Applicable).     If you need a refill on your cardiac medications before your next appointment, please call your pharmacy.

## 2016-12-11 NOTE — Telephone Encounter (Signed)
Rx sent 

## 2016-12-11 NOTE — Progress Notes (Signed)
SUBJECTIVE: The patient presents for routine follow-up. She underwent pacemaker placement for symptomatic bradycardia in June 2018. She has permanent atrial fibrillation. She also has a history of coronary artery disease and CABG, hypertension, and end-stage renal disease on dialysis.  She has bilateral carotid artery stenosis with a history of right carotid endarterectomy. She recently underwent CT angiography of the neck on 11/25/16 which demonstrated greater than 90% critical left internal carotid artery stenosis. She hasn't scheduled for carotid endarterectomy for July 25.  She is feeling well and denies chest pain and shortness of breath. She seldom has palpitations. She denies leg swelling.    Review of Systems: As per "subjective", otherwise negative.  Allergies  Allergen Reactions  . Tape Other (See Comments)    TAPE WILL CAUSE BLISTERS/SORES; PLEASE USE COBAN WRAP!!  . Gemfibrozil Other (See Comments)    Patient is not aware of this allergy but states that she had side effects to a cholesterol medication in the past  . Oxycodone Other (See Comments)    Hallucinations   . Carbamazepine Other (See Comments)    Reaction to tegretol - "loopy"  . Clonazepam Other (See Comments)    Sleepy   . Macrobid [Nitrofurantoin] Nausea And Vomiting    Current Outpatient Prescriptions  Medication Sig Dispense Refill  . amLODipine (NORVASC) 10 MG tablet Take 10 mg by mouth daily.    Marland Kitchen aspirin EC 81 MG EC tablet Take 1 tablet (81 mg total) by mouth daily. 30 tablet 1  . atorvastatin (LIPITOR) 80 MG tablet Take 1 tablet (80 mg total) by mouth daily. 30 tablet 3  . blood glucose meter kit and supplies KIT Dispense based on patient and insurance preference. Use up to four times daily as directed. (FOR ICD-10 E11.65) 1 each 0  . calcium acetate (PHOSLO) 667 MG capsule Take 2 capsules (1,334 mg total) by mouth 3 (three) times daily with meals. 180 capsule 1  . cetirizine (ZYRTEC) 10 MG  tablet Take 10 mg by mouth daily.     . diazepam (VALIUM) 5 MG tablet Take 5 mg by mouth 3 (three) times daily as needed for anxiety.     . enoxaparin (LOVENOX) 100 MG/ML injection     . ferric citrate (AURYXIA) 1 GM 210 MG(Fe) tablet Take 210 mg by mouth 3 (three) times daily with meals.     Marland Kitchen HYDROcodone-acetaminophen (NORCO/VICODIN) 5-325 MG tablet Take 1 tablet by mouth every 6 (six) hours as needed for moderate pain (Must last 30 days.Do not take and drive a car or use machinery.). 75 tablet 0  . Insulin Lispro Prot & Lispro (HUMALOG MIX 75/25 KWIKPEN) (75-25) 100 UNIT/ML Kwikpen Used 30 units with breakfast and 30 units with supper when pre-meal blood glucose is above 90 mg/dL. 10 pen 2  . nitroGLYCERIN (NITROSTAT) 0.4 MG SL tablet Place 1 tablet (0.4 mg total) under the tongue every 5 (five) minutes as needed for chest pain. Max three pills 45 tablet 0  . polyethylene glycol (MIRALAX / GLYCOLAX) packet Take 17 g by mouth 2 (two) times daily. (Patient taking differently: Take 17 g by mouth daily as needed for mild constipation. ) 14 each 0  . protein supplement (RESOURCE BENEPROTEIN) POWD Take 6 g by mouth 3 (three) times daily with meals.  0  . rOPINIRole (REQUIP) 0.25 MG tablet Take 1 tablet (0.25 mg total) by mouth at bedtime. 30 tablet 3  . sertraline (ZOLOFT) 100 MG tablet Take 200 mg by  mouth daily.    . Vitamin D, Ergocalciferol, (DRISDOL) 50000 units CAPS capsule Take 50,000 Units by mouth every 7 (seven) days. THURSDAY    . warfarin (COUMADIN) 5 MG tablet TAKE AS DIRECTED (Patient taking differently: Take 5 mg by mouth once a day at 6 PM) 90 tablet 3   No current facility-administered medications for this visit.     Past Medical History:  Diagnosis Date  . Adenomatous colon polyp 09/2007  . Arthritis   . Atrial fibrillation with rapid ventricular response ([place]) 10/05/2015  . Bradycardia   . CAD (coronary artery disease)    a. s/p CABG 2005.  . Carotid artery disease ([place])     a. s/p R CEA 2011. Followed by vascular -  duplex 04/16/16: <82% RICA, 80-03% LICA.  Marland Kitchen Chronic anemia   . Chronic diastolic CHF (congestive heart failure) ([place])   . Depression with anxiety   . Diabetes mellitus   . ESRD (end stage renal disease) ([place])    T Th Sat  . Heart murmur   . Hyperlipidemia   . Hypertension   . Neuropathy   . PAD (peripheral artery disease) ([place])   . Paroxysmal atrial flutter ([place])   . Peripheral vascular disease ([place])   . Pulmonary hypertension ([place])   . Rheumatic fever   . Sleep apnea     Past Surgical History:  Procedure Laterality Date  . BASCILIC VEIN TRANSPOSITION Left 12/04/2015   Procedure: BASILIC VEIN TRANSPOSITION;  Surgeon: Rosetta Posner, MD;  Location: [facility];  Service: Vascular;  Laterality: Left;  . CAROTID ENDARTERECTOMY  10/04/2009   Right CEA  . CORONARY ARTERY BYPASS GRAFT  2005  . PACEMAKER IMPLANT N/A 11/06/2016   Procedure: Pacemaker Implant;  Surgeon: Evans Lance, MD;  Location: [facility];  Service: Cardiovascular;  Laterality: N/A;  . PERIPHERAL VASCULAR CATHETERIZATION N/A 06/14/2016   Procedure: A/V Fistulagram - Left Upper;  Surgeon: Elam Dutch, MD;  Location: [place];  Service: Cardiovascular;  Laterality: N/A;  . POLYPECTOMY  02/25/2012   Procedure: POLYPECTOMY;  Surgeon: Danie Binder, MD;  Location: [facility];  Service: Endoscopy;  Laterality: N/A;  . TUBAL LIGATION      Social History   Social History  . Marital status: Married    Spouse name: N/A  . Number of children: N/A  . Years of education: N/A   Occupational History  . Not on file.   Social History Main Topics  . Smoking status: Never Smoker  . Smokeless tobacco: Never Used  . Alcohol use No  . Drug use: No  . Sexual activity: Not on file   Other Topics Concern  . Not on file   Social History Narrative  . No narrative on file     Vitals:   12/11/16 1348  BP: (!) 152/70  Pulse: 94  SpO2: 96%  Weight: 216 lb (98 kg)    Height: _0  (1.626 m)    Wt Readings from Last 3 Encounters:  12/11/16 216 lb (98 kg)  12/10/16 214 lb (97.1 kg)  11/12/16 216 lb (98 kg)     PHYSICAL EXAM General: NAD HEENT: Normal. Neck: No JVD, no thyromegaly. Lungs: Clear to auscultation bilaterally with normal respiratory effort. CV: Nondisplaced PMI.  Regular rate and irregular rhythm, normal S1/S2, no S3, no murmur. No pretibial or periankle edema.     Abdomen: Soft, nontender, no distention.  Neurologic: Alert and oriented.  Psych: Normal affect. Skin:  Normal. Musculoskeletal: No gross deformities.    ECG: Most recent ECG reviewed.   Labs: Lab Results  Component Value Date/Time   K 3.7 11/07/2016 12:30 PM   BUN 40 (H) 11/07/2016 12:30 PM   CREATININE 4.55 (H) 11/07/2016 12:30 PM   CREATININE 3.50 (H) 04/25/2016 04:14 PM   ALT 14 11/26/2015 04:35 AM   TSH 2.454 10/09/2016 07:14 PM   TSH 3.33 04/25/2016 04:14 PM   HGB 9.5 (L) 11/07/2016 12:30 PM     Lipids: Lab Results  Component Value Date/Time   LDLCALC  04/30/2009 03:23 AM    71        Total Cholesterol/HDL:CHD Risk Coronary Heart Disease Risk Table                     Men   Women  1/2 Average Risk   3.4   3.3  Average Risk       5.0   4.4  2 X Average Risk   9.6   7.1  3 X Average Risk  23.4   11.0        Use the calculated Patient Ratio above and the CHD Risk Table to determine the patient's CHD Risk.        ATP III CLASSIFICATION (LDL):  <100     mg/dL   Optimal  100-129  mg/dL   Near or Above                    Optimal  130-159  mg/dL   Borderline  160-189  mg/dL   High  >190     mg/dL   Very High   CHOL  04/30/2009 03:23 AM    142        ATP III CLASSIFICATION:  <200     mg/dL   Desirable  200-239  mg/dL   Borderline High  >=240    mg/dL   High          TRIG 151 (H) 04/30/2009 03:23 AM   HDL 41 04/30/2009 03:23 AM       ASSESSMENT AND PLAN:  1. CAD with h/o 3-v CABG in 04/2004: Symptomatically stable. Continue aspirin  and Lipitor. No longer on metoprolol. Stress testing in May 2017 showed mild peri-infarct ischemia in the anterior and anteroseptal walls.  2. Essential HTN: Elevated.I will monitor.  3. Hyperlipidemia: Continue high-dose Lipitor.  4. Critical left internal carotid artery stenosis and s/p right CEA: She can proceed with carotid endarterectomy with an acceptable level of risk. Given CHADSVASC score of 5, Lovenox bridging is recommended. I have filled out the appropriate clearance forms which will be sent back to the vascular surgery office. Continue ASA and Lipitor.  5. Chronic diastolic heart failure: Euvolemic. Volume managed by dialysis.  6. Permanent atrial fibrillation and symptomatic bradycardia s/p pacemaker: Anticoagulated with warfarin.Given CHADSVASC score of 5, Lovenox bridging is recommended.    Disposition: Follow up 6 months   Kate Sable, M.D., F.A.C.C.

## 2016-12-11 NOTE — Telephone Encounter (Signed)
[place] called stating that they have not received the pt's Lovenox

## 2016-12-13 NOTE — Progress Notes (Addendum)
CVE:LFYBOF, Lennon Alstrom, PA-C  Cardiologist: Dr. [name]  EKG: 11/07/2016 in [facility]  Stress test:pt denies  ECHO: 09/2015, 10/2014 in [facility]  Cardiac Cath: 04/2014 in [facility]  Chest x-ray: 11/07/16 in [facility]

## 2016-12-13 NOTE — Pre-Procedure Instructions (Signed)
Fatisha Rabalais [facility]  12/13/2016      [place], [place] [address] Phone: [phone] Fax: [phone]  [id number] [address], [place] [address] Phone: [phone] Fax: [phone]  [facility] [place], [place] AT [place] [address] Phone: [phone] Fax: [phone]  [place], [place] - [address] [place] [address] Phone: [phone] Fax: [place], [place]. [place] [address] Phone: [phone] Fax: [phone]    Your procedure is scheduled on December 18, 2016.  Report to [facility] Admitting at 530 AM.  Call this number if you have problems the morning of surgery:  [phone]   Remember:  Do not eat food or drink liquids after midnight.  Take these medicines the morning of surgery with A SIP OF WATER amlodipine (norvasc), cetirizine (zyrtec), diazepam (valium)-if needed, hydrocodone-acetaminophen (norco)-if needed for pain, sertraline (zoloft).  Stop warfarin (coumadin) as instructed by your surgeon.  7 days prior to surgery STOP taking any Aspirin, Aleve, Naproxen, Ibuprofen, Motrin, Advil, Goody's, BC's, all herbal medications, fish oil, and all vitamins     How to Manage Your Diabetes Before and After Surgery  Why is it important to control my blood sugar before and after surgery? . Improving blood sugar levels before and after surgery helps healing and can limit problems. . A way of improving blood sugar control is eating a healthy diet by: o  Eating less sugar and carbohydrates o  Increasing activity/exercise o  Talking with your doctor about reaching your blood sugar goals . High blood sugars (greater than 180 mg/dL) can raise your  risk of infections and slow your recovery, so you will need to focus on controlling your diabetes during the weeks before surgery. . Make sure that the doctor who takes care of your diabetes knows about your planned surgery including the date and location.  How do I manage my blood sugar before surgery? . Check your blood sugar at least 4 times a day, starting 2 days before surgery, to make sure that the level is not too high or low. o Check your blood sugar the morning of your surgery when you wake up and every 2 hours until you get to the Short Stay unit. . If your blood sugar is less than 70 mg/dL, you will need to treat for low blood sugar: o Do not take insulin. o Treat a low blood sugar (less than 70 mg/dL) with  cup of clear juice (cranberry or apple), 4 glucose tablets, OR glucose gel. o Recheck blood sugar in 15 minutes after treatment (to make sure it is greater than 70 mg/dL). If your blood sugar is not greater than 70 mg/dL on recheck, call [phone] for further instructions. . Report your blood sugar to the short stay nurse when you get to Short Stay.  . If you are admitted to the hospital after surgery: o Your blood sugar will be checked by the staff and you will probably be given insulin after surgery (instead of oral diabetes medicines) to make sure you have good blood sugar levels. o The goal for blood sugar control after surgery is 80-180 mg/dL.  WHAT DO I DO ABOUT MY DIABETES MEDICATION?   Marland Kitchen Do not take oral diabetes medicines (pills) the morning of surgery.  . THE NIGHT BEFORE SURGERY, take 15 units of humalog mix 75/25 insulin.       THE MORNING OF SURGERY, do not take any insulin.   . The day of surgery, do not take other diabetes injectables, including Byetta (exenatide), Bydureon (exenatide ER), Victoza (liraglutide), or Trulicity (dulaglutide).  . If your CBG is greater than 220 mg/dL, you may take  of your sliding scale (correction) dose of  insulin.   Reviewed and Endorsed by [facility] Patient Education Committee, August 2015   Do not wear jewelry, make-up or nail polish.  Do not wear lotions, powders, or perfumes, or deoderant.  Do not shave 48 hours prior to surgery.    Do not bring valuables to the hospital.  [facility] is not responsible for any belongings or valuables.  Contacts, dentures or bridgework may not be worn into surgery.  Leave your suitcase in the car.  After surgery it may be brought to your room.  For patients admitted to the hospital, discharge time will be determined by your treatment team.  Patients discharged the day of surgery will not be allowed to drive home.   Special instructions:   [place]- Preparing For Surgery  Before surgery, you can play an important role. Because skin is not sterile, your skin needs to be as free of germs as possible. You can reduce the number of germs on your skin by washing with CHG (chlorahexidine gluconate) Soap before surgery.  CHG is an antiseptic cleaner which kills germs and bonds with the skin to continue killing germs even after washing.  Please do not use if you have an allergy to CHG or antibacterial soaps. If your skin becomes reddened/irritated stop using the CHG.  Do not shave (including legs and underarms) for at least 48 hours prior to first CHG shower. It is OK to shave your face.  Please follow these instructions carefully.   1. Shower the NIGHT BEFORE SURGERY and the MORNING OF SURGERY with CHG.   2. If you chose to wash your hair, wash your hair first as usual with your normal shampoo.  3. After you shampoo, rinse your hair and body thoroughly to remove the shampoo.  4. Use CHG as you would any other liquid soap. You can apply CHG directly to the skin and wash gently with a scrungie or a clean washcloth.   5. Apply the CHG Soap to your body ONLY FROM THE NECK DOWN.  Do not use on open wounds or open sores. Avoid contact with your eyes,  ears, mouth and genitals (private parts). Wash genitals (private parts) with your normal soap.  6. Wash thoroughly, paying special attention to the area where your surgery will be performed.  7. Thoroughly rinse your body with warm water from the neck down.  8. DO NOT shower/wash with your normal soap after using and rinsing off the CHG Soap.  9. Pat yourself dry with a CLEAN TOWEL.   10. Wear CLEAN PAJAMAS   11. Place CLEAN SHEETS on your bed the night of your first shower and DO NOT SLEEP WITH PETS.    Day of Surgery: Do not apply any deodorants/lotions. Please wear clean clothes to the hospital/surgery center.     Please read over the following fact sheets that you were given. Pain Booklet, Coughing and Deep Breathing, MRSA Information and Surgical  Site Infection Prevention

## 2016-12-15 DIAGNOSIS — I2510 Atherosclerotic heart disease of native coronary artery without angina pectoris: Secondary | ICD-10-CM | POA: Diagnosis not present

## 2016-12-16 ENCOUNTER — Encounter (HOSPITAL_COMMUNITY): Payer: Self-pay

## 2016-12-16 ENCOUNTER — Encounter (HOSPITAL_COMMUNITY)
Admission: RE | Admit: 2016-12-16 | Discharge: 2016-12-16 | Disposition: A | Discharge status: Home / Self Care | Payer: Medicare Other | Source: Ambulatory Visit | Attending: Vascular Surgery | Admitting: Vascular Surgery

## 2016-12-16 DIAGNOSIS — I6522 Occlusion and stenosis of left carotid artery: Secondary | ICD-10-CM | POA: Insufficient documentation

## 2016-12-16 DIAGNOSIS — Z01818 Encounter for other preprocedural examination: Secondary | ICD-10-CM | POA: Insufficient documentation

## 2016-12-16 LAB — COMPREHENSIVE METABOLIC PANEL
ALT: 13 U/L — AB (ref 14–54)
ANION GAP: 8 (ref 5–15)
AST: 20 U/L (ref 15–41)
Albumin: 3.8 g/dL (ref 3.5–5.0)
Alkaline Phosphatase: 75 U/L (ref 38–126)
BUN: 26 mg/dL — ABNORMAL HIGH (ref 6–20)
CHLORIDE: 100 mmol/L — AB (ref 101–111)
CO2: 30 mmol/L (ref 22–32)
CREATININE: 3.24 mg/dL — AB (ref 0.44–1.00)
Calcium: 9.3 mg/dL (ref 8.9–10.3)
GFR, EST AFRICAN AMERICAN: 15 mL/min — AB (ref >60)
GFR, EST NON AFRICAN AMERICAN: 13 mL/min — AB (ref >60)
Glucose, Bld: 159 mg/dL — ABNORMAL HIGH (ref 65–99)
Potassium: 3.9 mmol/L (ref 3.5–5.1)
SODIUM: 138 mmol/L (ref 135–145)
Total Bilirubin: 0.9 mg/dL (ref 0.3–1.2)
Total Protein: 6.6 g/dL (ref 6.5–8.1)

## 2016-12-16 LAB — CBC
HCT: 36.5 % (ref 36.0–46.0)
Hemoglobin: 11 g/dL — ABNORMAL LOW (ref 12.0–15.0)
MCH: 27.8 pg (ref 26.0–34.0)
MCHC: 30.1 g/dL (ref 30.0–36.0)
MCV: 92.4 fL (ref 78.0–100.0)
Platelets: 156 10*3/uL (ref 150–400)
RBC: 3.95 MIL/uL (ref 3.87–5.11)
RDW: 15.1 % (ref 11.5–15.5)
WBC: 5.1 10*3/uL (ref 4.0–10.5)

## 2016-12-16 LAB — GLUCOSE, CAPILLARY: Glucose-Capillary: 162 mg/dL — ABNORMAL HIGH (ref 65–99)

## 2016-12-16 LAB — TYPE AND SCREEN
ABO/RH(D): A POS
Antibody Screen: NEGATIVE

## 2016-12-16 LAB — PROTIME-INR
INR: 1.88
PROTHROMBIN TIME: 21.9 s — AB (ref 11.4–15.2)

## 2016-12-16 LAB — SURGICAL PCR SCREEN
MRSA, PCR: NEGATIVE
Staphylococcus aureus: NEGATIVE

## 2016-12-16 LAB — APTT: aPTT: 40 seconds — ABNORMAL HIGH (ref 24–36)

## 2016-12-16 MED ORDER — CHLORHEXIDINE GLUCONATE 4 % EX LIQD: 60.0000 mL | Freq: Once | CUTANEOUS | Status: DC

## 2016-12-17 NOTE — Progress Notes (Addendum)
Anesthesia Chart Review: Patient is a 71 year old female scheduled for left carotid endarterectomy on 12/18/16 by Dr. Donnetta Hutching.  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 [name]), chronic diastolic CHF, pulmonary hypertension (severe by 09/2015 echo), PAD, bilateral carotid artery stenosis s/p right CEA 10/04/09, DM2, HTN, HLD, PVD, anxiety, depression, chronic anemia. BMI is consistent with obesity.   - PCP is listed as Neysa Hotter, PA-C.  - Endocrinologist is Dr. Glade Lloyd, last visit 11/12/16. - Cardiologist is Dr. Kate Sable. On 12/11/16 he wrote, "She can proceed with carotid endarterectomy with an acceptable level of risk. Given CHADSVASC score of 5, Lovenox bridging is recommended. I have filled out the appropriate clearance forms which will be sent back to the vascular surgery office. Continue ASA and Lipitor." - EP cardiologist is Dr. [name].  Meds include amlodipine, aspirin 81 mg, Lipitor, PhosLo, Zyrtec, Valium, warfarin (on hold 12/12/16; Lovenox bridge started 12/14/16, last dose 12/17/16 until after surgery), ferric citrate, Norco, Nitro, Humalog 75/25, Requip, Zoloft.   BP (!) 139/52   Pulse 68   Temp 36.6 C   Resp 18   Ht 5\' 4"  (1.626 m)   Wt 216 lb (98 kg)   SpO2 93%   BMI 37.08 kg/m   EKG 11/07/16: Ventricular paced rhythm with occasional PVCs. Atrial flutter. No significant change since last tracing.  Echo 10/02/15: Study Conclusions - Left ventricle: The cavity size was normal. There was mild focal   basal hypertrophy of the septum. Systolic function was normal.   The estimated ejection fraction was in the range of 55% to 60%.   Wall motion was normal; there were no regional wall motion   abnormalities. There was a  reduced contribution of atrial   contraction to ventricular filling, due to increased ventricular   diastolic pressure or atrial contractile dysfunction. Doppler   parameters are consistent with a reversible restrictive pattern,   indicative of decreased left ventricular diastolic compliance   and/or increased left atrial pressure (grade 3 diastolic   dysfunction). Doppler parameters are consistent with high   ventricular filling pressure. - Aortic valve: Severe diffuse thickening and calcification,   consistent with sclerosis. There was mild regurgitation. Valve   area (VTI): 1.93 cm^2. Valve area (Vmax): 1.83 cm^2. Valve area   (Vmean): 1.97 cm^2. - Mitral valve: Severely calcified annulus. Mild diffuse   calcification, with moderate involvement of chords. There was   mild regurgitation. Valve area by continuity equation (using LVOT   flow): 1.87 cm^2. - Left atrium: The atrium was severely dilated. - Tricuspid valve: There was moderate regurgitation. - Pulmonary arteries: PA peak pressure: 69 mm Hg (S). Impressions: - The right ventricular systolic pressure was increased consistent   with severe pulmonary hypertension. (TEE 10/09/15 showed EF 55-60%, no thrombus, mild AR, mild MR, moderate TR. No ASD or PFO noted.)  Nuclear stress test 10/10/15: IMPRESSION: 1. Concern for mild peri-infarct ischemia involving the anterior wall and anteroseptal wall. Evidence for infarcts involving the anterior and lateral walls. 2. Dyskinesia at the apex. 3. Left ventricular ejection fraction is 45%. 4. Non invasive risk stratification*: Intermediate  Last cath seen was PRE-CABG from 05/23/04 (done at [facility]).   CTA neck 11/25/16: IMPRESSION: 1. Limited assessment due to contrast bolus timing. 2. Suspected critical stenosis LEFT  internal carotid artery origin. 3. Atherosclerosis resulting in less than 50% stenosis RIGHT internal carotid artery. 4. Moderate probable  stenosis LEFT Common carotid artery origin. 5. Vertebral basilar calcification could result in stenosis. Severe atherosclerosis carotid siphons. Recommend CTA HEAD on nonemergent basis. 6. Mild mediastinal lymphadenopathy.  CXR 11/07/16: IMPRESSION: 1. The right atrial lead is directed medially with its tip either in the medial aspect of a dilated right atrium, tricuspid valve or medial aspect of the right ventricle. 2. The right ventricular lead tip is in the region of the inferior aspect of the medial to midportion of the right ventricle. 3. Stable cardiomegaly, pulmonary vascular congestion and mild changes of congestive heart failure.  Preoperative labs noted. Will get ISTAT4 and repeat PT/PTT on the day of surgery. She rarely produces urine, so she was unable to provide a UA specimen at PAT.  If follow-up labs acceptable and otherwise no acute changes then I would anticipate that she can proceed as planned. Nursing staff to follow-up on perioperative PPM device form from [facility].  George Hugh [facility] Short Stay Center/Anesthesiology Phone [phone] 12/17/2016 12:26 PM

## 2016-12-18 ENCOUNTER — Inpatient Hospital Stay (HOSPITAL_COMMUNITY): Payer: Medicare Other | Admitting: Anesthesiology

## 2016-12-18 ENCOUNTER — Encounter (HOSPITAL_COMMUNITY)
Admission: RE | Disposition: A | Discharge status: Home Health | Payer: Self-pay | Source: Ambulatory Visit | Attending: Vascular Surgery

## 2016-12-18 ENCOUNTER — Inpatient Hospital Stay (HOSPITAL_COMMUNITY): Payer: Medicare Other | Admitting: Vascular Surgery

## 2016-12-18 ENCOUNTER — Ambulatory Visit: Payer: Medicare Other | Admitting: Orthopaedic Surgery

## 2016-12-18 ENCOUNTER — Inpatient Hospital Stay (HOSPITAL_COMMUNITY)
Admission: RE | Admit: 2016-12-18 | Discharge: 2016-12-20 | DRG: 037 | Disposition: A | Discharge status: Home Health | Payer: Medicare Other | Source: Ambulatory Visit | Attending: Vascular Surgery | Admitting: Vascular Surgery

## 2016-12-18 ENCOUNTER — Encounter (HOSPITAL_COMMUNITY): Payer: Self-pay | Admitting: *Deleted

## 2016-12-18 DIAGNOSIS — Z7982 Long term (current) use of aspirin: Secondary | ICD-10-CM | POA: Diagnosis not present

## 2016-12-18 DIAGNOSIS — I5032 Chronic diastolic (congestive) heart failure: Secondary | ICD-10-CM | POA: Diagnosis present

## 2016-12-18 DIAGNOSIS — Z833 Family history of diabetes mellitus: Secondary | ICD-10-CM | POA: Diagnosis not present

## 2016-12-18 DIAGNOSIS — I4891 Unspecified atrial fibrillation: Secondary | ICD-10-CM | POA: Diagnosis present

## 2016-12-18 DIAGNOSIS — Z86010 Personal history of colonic polyps: Secondary | ICD-10-CM | POA: Diagnosis not present

## 2016-12-18 DIAGNOSIS — Z885 Allergy status to narcotic agent status: Secondary | ICD-10-CM

## 2016-12-18 DIAGNOSIS — E1151 Type 2 diabetes mellitus with diabetic peripheral angiopathy without gangrene: Secondary | ICD-10-CM | POA: Diagnosis present

## 2016-12-18 DIAGNOSIS — Z6837 Body mass index (BMI) 37.0-37.9, adult: Secondary | ICD-10-CM | POA: Diagnosis not present

## 2016-12-18 DIAGNOSIS — E1122 Type 2 diabetes mellitus with diabetic chronic kidney disease: Secondary | ICD-10-CM | POA: Diagnosis present

## 2016-12-18 DIAGNOSIS — Z7901 Long term (current) use of anticoagulants: Secondary | ICD-10-CM

## 2016-12-18 DIAGNOSIS — Y92238 Other place in hospital as the place of occurrence of the external cause: Secondary | ICD-10-CM | POA: Diagnosis not present

## 2016-12-18 DIAGNOSIS — Z992 Dependence on renal dialysis: Secondary | ICD-10-CM | POA: Diagnosis not present

## 2016-12-18 DIAGNOSIS — Z79899 Other long term (current) drug therapy: Secondary | ICD-10-CM

## 2016-12-18 DIAGNOSIS — G4730 Sleep apnea, unspecified: Secondary | ICD-10-CM | POA: Diagnosis present

## 2016-12-18 DIAGNOSIS — I2510 Atherosclerotic heart disease of native coronary artery without angina pectoris: Secondary | ICD-10-CM | POA: Diagnosis present

## 2016-12-18 DIAGNOSIS — Z888 Allergy status to other drugs, medicaments and biological substances status: Secondary | ICD-10-CM

## 2016-12-18 DIAGNOSIS — Z794 Long term (current) use of insulin: Secondary | ICD-10-CM

## 2016-12-18 DIAGNOSIS — Z951 Presence of aortocoronary bypass graft: Secondary | ICD-10-CM

## 2016-12-18 DIAGNOSIS — Z91048 Other nonmedicinal substance allergy status: Secondary | ICD-10-CM

## 2016-12-18 DIAGNOSIS — S1093XA Contusion of unspecified part of neck, initial encounter: Secondary | ICD-10-CM | POA: Diagnosis not present

## 2016-12-18 DIAGNOSIS — I132 Hypertensive heart and chronic kidney disease with heart failure and with stage 5 chronic kidney disease, or end stage renal disease: Secondary | ICD-10-CM | POA: Diagnosis present

## 2016-12-18 DIAGNOSIS — N186 End stage renal disease: Secondary | ICD-10-CM | POA: Diagnosis present

## 2016-12-18 DIAGNOSIS — I6522 Occlusion and stenosis of left carotid artery: Principal | ICD-10-CM | POA: Diagnosis present

## 2016-12-18 DIAGNOSIS — Y838 Other surgical procedures as the cause of abnormal reaction of the patient, or of later complication, without mention of misadventure at the time of the procedure: Secondary | ICD-10-CM | POA: Diagnosis not present

## 2016-12-18 DIAGNOSIS — Z8249 Family history of ischemic heart disease and other diseases of the circulatory system: Secondary | ICD-10-CM | POA: Diagnosis not present

## 2016-12-18 DIAGNOSIS — E669 Obesity, unspecified: Secondary | ICD-10-CM | POA: Diagnosis present

## 2016-12-18 DIAGNOSIS — I97621 Postprocedural hematoma of a circulatory system organ or structure following other procedure: Secondary | ICD-10-CM

## 2016-12-18 HISTORY — PX: PATCH ANGIOPLASTY: SHX6230

## 2016-12-18 HISTORY — PX: ENDARTERECTOMY: SHX5162

## 2016-12-18 HISTORY — PX: HEMATOMA EVACUATION: SHX5118

## 2016-12-18 LAB — APTT: APTT: 42 s — AB (ref 24–36)

## 2016-12-18 LAB — URINALYSIS, ROUTINE W REFLEX MICROSCOPIC
BILIRUBIN URINE: NEGATIVE
GLUCOSE, UA: 150 mg/dL — AB
Hgb urine dipstick: NEGATIVE
KETONES UR: NEGATIVE mg/dL
Nitrite: NEGATIVE
Protein, ur: 100 mg/dL — AB
Specific Gravity, Urine: 1.021 (ref 1.005–1.030)
pH: 6 (ref 5.0–8.0)

## 2016-12-18 LAB — GLUCOSE, CAPILLARY
GLUCOSE-CAPILLARY: 76 mg/dL (ref 65–99)
GLUCOSE-CAPILLARY: 80 mg/dL (ref 65–99)
Glucose-Capillary: 85 mg/dL (ref 65–99)
Glucose-Capillary: 174 mg/dL — ABNORMAL HIGH (ref 65–99)
Glucose-Capillary: 199 mg/dL — ABNORMAL HIGH (ref 65–99)

## 2016-12-18 LAB — POCT I-STAT 4, (NA,K, GLUC, HGB,HCT)
GLUCOSE: 65 mg/dL (ref 65–99)
HCT: 32 % — ABNORMAL LOW (ref 36.0–46.0)
Hemoglobin: 10.9 g/dL — ABNORMAL LOW (ref 12.0–15.0)
Potassium: 3.1 mmol/L — ABNORMAL LOW (ref 3.5–5.1)
Sodium: 140 mmol/L (ref 135–145)

## 2016-12-18 LAB — PROTIME-INR
INR: 1.4
PROTHROMBIN TIME: 17.3 s — AB (ref 11.4–15.2)

## 2016-12-18 SURGERY — EVACUATION HEMATOMA
Anesthesia: General | Site: Neck | Laterality: Left

## 2016-12-18 SURGERY — ENDARTERECTOMY, CAROTID
Anesthesia: General | Site: Neck | Laterality: Left

## 2016-12-18 MED ORDER — ROCURONIUM BROMIDE 100 MG/10ML IV SOLN
INTRAVENOUS | Status: DC | PRN
  Administered 2016-12-18 (×2): 10 mg via INTRAVENOUS
  Administered 2016-12-18: 40 mg via INTRAVENOUS

## 2016-12-18 MED ORDER — HEMOSTATIC AGENTS (NO CHARGE) OPTIME
TOPICAL | Status: DC | PRN
  Administered 2016-12-18: 1 via TOPICAL

## 2016-12-18 MED ORDER — LIDOCAINE 2% (20 MG/ML) 5 ML SYRINGE
INTRAMUSCULAR | Status: AC
  Filled 2016-12-18: qty 5

## 2016-12-18 MED ORDER — ACETAMINOPHEN 650 MG RE SUPP: 325.0000 mg | RECTAL | Status: DC | PRN

## 2016-12-18 MED ORDER — DEXTROSE 5 % IV SOLN
1.5000 g | Freq: Two times a day (BID) | INTRAVENOUS | Status: DC
  Filled 2016-12-18: qty 1.5

## 2016-12-18 MED ORDER — ALBUMIN HUMAN 5 % IV SOLN
12.5000 g | Freq: Once | INTRAVENOUS | Status: AC
  Administered 2016-12-18: 12.5 g via INTRAVENOUS

## 2016-12-18 MED ORDER — POLYETHYLENE GLYCOL 3350 17 G PO PACK: 17.0000 g | PACK | Freq: Every day | ORAL | Status: DC | PRN

## 2016-12-18 MED ORDER — ATORVASTATIN CALCIUM 80 MG PO TABS
80.0000 mg | ORAL_TABLET | Freq: Every day | ORAL | Status: DC
  Administered 2016-12-19: 80 mg via ORAL
  Filled 2016-12-18: qty 1

## 2016-12-18 MED ORDER — 0.9 % SODIUM CHLORIDE (POUR BTL) OPTIME
TOPICAL | Status: DC | PRN
  Administered 2016-12-18: 2000 mL

## 2016-12-18 MED ORDER — ASPIRIN EC 81 MG PO TBEC
81.0000 mg | DELAYED_RELEASE_TABLET | Freq: Every day | ORAL | Status: DC
  Administered 2016-12-19: 81 mg via ORAL
  Filled 2016-12-18: qty 1

## 2016-12-18 MED ORDER — WARFARIN SODIUM 5 MG PO TABS: 5.0000 mg | ORAL_TABLET | Freq: Every day | ORAL | Status: DC

## 2016-12-18 MED ORDER — ARTIFICIAL TEARS OPHTHALMIC OINT
TOPICAL_OINTMENT | OPHTHALMIC | Status: AC
  Filled 2016-12-18: qty 3.5

## 2016-12-18 MED ORDER — LIDOCAINE HCL (CARDIAC) 20 MG/ML IV SOLN
INTRAVENOUS | Status: DC | PRN
  Administered 2016-12-18: 80 mg via INTRATRACHEAL

## 2016-12-18 MED ORDER — HYDROCODONE-ACETAMINOPHEN 5-325 MG PO TABS
1.0000 | ORAL_TABLET | ORAL | Status: DC | PRN
  Administered 2016-12-18 – 2016-12-19 (×2): 2 via ORAL
  Filled 2016-12-18 (×2): qty 2

## 2016-12-18 MED ORDER — AMLODIPINE BESYLATE 10 MG PO TABS: 10.0000 mg | ORAL_TABLET | Freq: Every day | ORAL | Status: DC

## 2016-12-18 MED ORDER — HYDRALAZINE HCL 20 MG/ML IJ SOLN: 5.0000 mg | INTRAMUSCULAR | Status: DC | PRN

## 2016-12-18 MED ORDER — ONDANSETRON HCL 4 MG/2ML IJ SOLN
INTRAMUSCULAR | Status: AC
  Filled 2016-12-18: qty 2

## 2016-12-18 MED ORDER — FENTANYL CITRATE (PF) 250 MCG/5ML IJ SOLN
INTRAMUSCULAR | Status: AC
  Filled 2016-12-18: qty 5

## 2016-12-18 MED ORDER — ROCURONIUM BROMIDE 10 MG/ML (PF) SYRINGE
PREFILLED_SYRINGE | INTRAVENOUS | Status: AC
  Filled 2016-12-18: qty 5

## 2016-12-18 MED ORDER — SODIUM CHLORIDE 0.9 % IR SOLN
Status: DC | PRN
  Administered 2016-12-18: 1000 mL

## 2016-12-18 MED ORDER — CEFAZOLIN SODIUM-DEXTROSE 2-3 GM-% IV SOLR
INTRAVENOUS | Status: DC | PRN
  Administered 2016-12-18: 2 g via INTRAVENOUS

## 2016-12-18 MED ORDER — PHENYLEPHRINE HCL 10 MG/ML IJ SOLN
INTRAVENOUS | Status: DC | PRN
  Administered 2016-12-18: 25 ug/min via INTRAVENOUS

## 2016-12-18 MED ORDER — OXYCODONE HCL 5 MG PO TABS: 5.0000 mg | ORAL_TABLET | Freq: Once | ORAL | Status: DC | PRN

## 2016-12-18 MED ORDER — PANTOPRAZOLE SODIUM 40 MG PO TBEC
40.0000 mg | DELAYED_RELEASE_TABLET | Freq: Every day | ORAL | Status: DC
  Administered 2016-12-19: 40 mg via ORAL
  Filled 2016-12-18: qty 1

## 2016-12-18 MED ORDER — DEXTROSE 5 % IV SOLN
1.5000 g | INTRAVENOUS | Status: AC
  Administered 2016-12-18: 1.5 g via INTRAVENOUS
  Filled 2016-12-18: qty 1.5

## 2016-12-18 MED ORDER — LIDOCAINE HCL (PF) 1 % IJ SOLN
INTRAMUSCULAR | Status: AC
  Filled 2016-12-18: qty 30

## 2016-12-18 MED ORDER — FENTANYL CITRATE (PF) 250 MCG/5ML IJ SOLN
INTRAMUSCULAR | Status: DC | PRN
  Administered 2016-12-18: 25 ug via INTRAVENOUS
  Administered 2016-12-18: 75 ug via INTRAVENOUS

## 2016-12-18 MED ORDER — PHENYLEPHRINE HCL 10 MG/ML IJ SOLN
INTRAMUSCULAR | Status: DC | PRN
  Administered 2016-12-18: 160 ug via INTRAVENOUS
  Administered 2016-12-18 (×3): 80 ug via INTRAVENOUS

## 2016-12-18 MED ORDER — INSULIN ASPART 100 UNIT/ML ~~LOC~~ SOLN
0.0000 [IU] | Freq: Three times a day (TID) | SUBCUTANEOUS | Status: DC
  Administered 2016-12-19: 3 [IU] via SUBCUTANEOUS
  Administered 2016-12-19: 1 [IU] via SUBCUTANEOUS
  Administered 2016-12-20: 3 [IU] via SUBCUTANEOUS

## 2016-12-18 MED ORDER — PROPOFOL 10 MG/ML IV BOLUS
INTRAVENOUS | Status: AC
  Filled 2016-12-18: qty 20

## 2016-12-18 MED ORDER — PROTAMINE SULFATE 10 MG/ML IV SOLN
INTRAVENOUS | Status: AC
  Filled 2016-12-18: qty 50

## 2016-12-18 MED ORDER — ACETAMINOPHEN 325 MG PO TABS: 325.0000 mg | ORAL_TABLET | ORAL | Status: DC | PRN

## 2016-12-18 MED ORDER — DEXTROSE 50 % IV SOLN
25.0000 mL | Freq: Once | INTRAVENOUS | Status: AC
  Administered 2016-12-18: 25 mL via INTRAVENOUS
  Filled 2016-12-18: qty 50

## 2016-12-18 MED ORDER — GUAIFENESIN-DM 100-10 MG/5ML PO SYRP
15.0000 mL | ORAL_SOLUTION | ORAL | Status: DC | PRN
  Administered 2016-12-18: 15 mL via ORAL
  Filled 2016-12-18: qty 15

## 2016-12-18 MED ORDER — ALBUMIN HUMAN 5 % IV SOLN
INTRAVENOUS | Status: AC
  Filled 2016-12-18: qty 250

## 2016-12-18 MED ORDER — MAGNESIUM SULFATE 2 GM/50ML IV SOLN
2.0000 g | Freq: Every day | INTRAVENOUS | Status: DC | PRN
  Filled 2016-12-18: qty 50

## 2016-12-18 MED ORDER — ROPINIROLE HCL 0.25 MG PO TABS
0.2500 mg | ORAL_TABLET | Freq: Every day | ORAL | Status: DC
  Administered 2016-12-18 – 2016-12-19 (×2): 0.25 mg via ORAL
  Filled 2016-12-18 (×2): qty 1

## 2016-12-18 MED ORDER — PHENYLEPHRINE HCL 10 MG/ML IJ SOLN
INTRAMUSCULAR | Status: DC | PRN
  Administered 2016-12-18: 80 ug via INTRAVENOUS

## 2016-12-18 MED ORDER — SODIUM CHLORIDE 0.9 % IV SOLN
INTRAVENOUS | Status: DC
  Administered 2016-12-18 (×2): via INTRAVENOUS

## 2016-12-18 MED ORDER — METOPROLOL TARTRATE 5 MG/5ML IV SOLN: 2.0000 mg | INTRAVENOUS | Status: DC | PRN

## 2016-12-18 MED ORDER — ALUM & MAG HYDROXIDE-SIMETH 200-200-20 MG/5ML PO SUSP
15.0000 mL | ORAL | Status: DC | PRN
Start: 2016-12-18 — End: 2016-12-20

## 2016-12-18 MED ORDER — LABETALOL HCL 5 MG/ML IV SOLN
INTRAVENOUS | Status: AC
  Administered 2016-12-18: 5 mg via INTRAVENOUS
  Filled 2016-12-18: qty 4

## 2016-12-18 MED ORDER — NEOSTIGMINE METHYLSULFATE 5 MG/5ML IV SOSY
PREFILLED_SYRINGE | INTRAVENOUS | Status: AC
  Filled 2016-12-18: qty 5

## 2016-12-18 MED ORDER — DEXTROSE 50 % IV SOLN
INTRAVENOUS | Status: AC
  Administered 2016-12-18: 25 mL via INTRAVENOUS
  Filled 2016-12-18: qty 50

## 2016-12-18 MED ORDER — SERTRALINE HCL 100 MG PO TABS
200.0000 mg | ORAL_TABLET | Freq: Every day | ORAL | Status: DC
  Administered 2016-12-19: 200 mg via ORAL
  Filled 2016-12-18 (×2): qty 2

## 2016-12-18 MED ORDER — MIDAZOLAM HCL 2 MG/2ML IJ SOLN
INTRAMUSCULAR | Status: AC
  Filled 2016-12-18: qty 2

## 2016-12-18 MED ORDER — PHENOL 1.4 % MT LIQD
1.0000 | OROMUCOSAL | Status: DC | PRN
  Administered 2016-12-19: 1 via OROMUCOSAL
  Filled 2016-12-18: qty 177

## 2016-12-18 MED ORDER — LABETALOL HCL 5 MG/ML IV SOLN
10.0000 mg | INTRAVENOUS | Status: DC | PRN
  Administered 2016-12-18: 5 mg via INTRAVENOUS

## 2016-12-18 MED ORDER — LIDOCAINE 2% (20 MG/ML) 5 ML SYRINGE
INTRAMUSCULAR | Status: DC | PRN
  Administered 2016-12-18: 80 mg via INTRAVENOUS

## 2016-12-18 MED ORDER — FENTANYL CITRATE (PF) 100 MCG/2ML IJ SOLN
INTRAMUSCULAR | Status: AC
  Administered 2016-12-18: 50 ug via INTRAVENOUS
  Filled 2016-12-18: qty 2

## 2016-12-18 MED ORDER — GLYCOPYRROLATE 0.2 MG/ML IJ SOLN
INTRAMUSCULAR | Status: DC | PRN
  Administered 2016-12-18: 0.1 mg via INTRAVENOUS
  Administered 2016-12-18: .8 mg via INTRAVENOUS

## 2016-12-18 MED ORDER — DOCUSATE SODIUM 100 MG PO CAPS
100.0000 mg | ORAL_CAPSULE | Freq: Every day | ORAL | Status: DC
  Filled 2016-12-18: qty 1

## 2016-12-18 MED ORDER — SODIUM CHLORIDE 0.9 % IV SOLN
INTRAVENOUS | Status: DC | PRN
  Administered 2016-12-18: 12:00:00 via INTRAVENOUS

## 2016-12-18 MED ORDER — SODIUM CHLORIDE 0.9 % IV SOLN: 500.0000 mL | Freq: Once | INTRAVENOUS | Status: DC | PRN

## 2016-12-18 MED ORDER — NEOSTIGMINE METHYLSULFATE 10 MG/10ML IV SOLN
INTRAVENOUS | Status: DC | PRN
  Administered 2016-12-18: 5 mg via INTRAVENOUS

## 2016-12-18 MED ORDER — FENTANYL CITRATE (PF) 100 MCG/2ML IJ SOLN
25.0000 ug | INTRAMUSCULAR | Status: DC | PRN
  Administered 2016-12-18 (×4): 50 ug via INTRAVENOUS

## 2016-12-18 MED ORDER — FENTANYL CITRATE (PF) 100 MCG/2ML IJ SOLN
INTRAMUSCULAR | Status: AC
  Filled 2016-12-18: qty 2

## 2016-12-18 MED ORDER — NITROGLYCERIN 0.4 MG SL SUBL: 0.4000 mg | SUBLINGUAL_TABLET | SUBLINGUAL | Status: DC | PRN

## 2016-12-18 MED ORDER — SODIUM CHLORIDE 0.9 % IV SOLN
INTRAVENOUS | Status: DC
  Administered 2016-12-18: 10:00:00 via INTRAVENOUS

## 2016-12-18 MED ORDER — CALCIUM ACETATE (PHOS BINDER) 667 MG PO CAPS
1334.0000 mg | ORAL_CAPSULE | Freq: Three times a day (TID) | ORAL | Status: DC
  Administered 2016-12-19 – 2016-12-20 (×2): 1334 mg via ORAL
  Filled 2016-12-18 (×3): qty 2

## 2016-12-18 MED ORDER — HEPARIN SODIUM (PORCINE) 1000 UNIT/ML IJ SOLN
INTRAMUSCULAR | Status: DC | PRN
  Administered 2016-12-18: 10000 [IU] via INTRAVENOUS

## 2016-12-18 MED ORDER — VITAMIN D (ERGOCALCIFEROL) 1.25 MG (50000 UNIT) PO CAPS
50000.0000 [IU] | ORAL_CAPSULE | ORAL | Status: DC
  Administered 2016-12-19: 50000 [IU] via ORAL
  Filled 2016-12-18: qty 1

## 2016-12-18 MED ORDER — PROPOFOL 10 MG/ML IV BOLUS
INTRAVENOUS | Status: DC | PRN
  Administered 2016-12-18: 100 mg via INTRAVENOUS

## 2016-12-18 MED ORDER — FERRIC CITRATE 1 GM 210 MG(FE) PO TABS
210.0000 mg | ORAL_TABLET | Freq: Three times a day (TID) | ORAL | Status: DC
  Administered 2016-12-19 – 2016-12-20 (×2): 210 mg via ORAL
  Filled 2016-12-18 (×5): qty 1

## 2016-12-18 MED ORDER — PROTAMINE SULFATE 10 MG/ML IV SOLN
INTRAVENOUS | Status: DC | PRN
  Administered 2016-12-18 (×2): 50 mg via INTRAVENOUS

## 2016-12-18 MED ORDER — ENOXAPARIN SODIUM 100 MG/ML ~~LOC~~ SOLN: 100.0000 mg | SUBCUTANEOUS | Status: DC

## 2016-12-18 MED ORDER — LORATADINE 10 MG PO TABS
10.0000 mg | ORAL_TABLET | Freq: Every day | ORAL | Status: DC
  Administered 2016-12-19: 10 mg via ORAL
  Filled 2016-12-18: qty 1

## 2016-12-18 MED ORDER — ONDANSETRON HCL 4 MG/2ML IJ SOLN: 4.0000 mg | Freq: Four times a day (QID) | INTRAMUSCULAR | Status: DC | PRN

## 2016-12-18 MED ORDER — SUCCINYLCHOLINE CHLORIDE 200 MG/10ML IV SOSY
PREFILLED_SYRINGE | INTRAVENOUS | Status: DC | PRN
  Administered 2016-12-18: 100 mg via INTRAVENOUS

## 2016-12-18 MED ORDER — DIAZEPAM 5 MG PO TABS
5.0000 mg | ORAL_TABLET | Freq: Three times a day (TID) | ORAL | Status: DC | PRN
  Administered 2016-12-18: 5 mg via ORAL
  Filled 2016-12-18: qty 1

## 2016-12-18 MED ORDER — POTASSIUM CHLORIDE CRYS ER 20 MEQ PO TBCR: 20.0000 meq | EXTENDED_RELEASE_TABLET | Freq: Every day | ORAL | Status: DC | PRN

## 2016-12-18 MED ORDER — BENEPROTEIN PO POWD
1.0000 | Freq: Three times a day (TID) | ORAL | Status: DC
  Filled 2016-12-18: qty 227

## 2016-12-18 MED ORDER — DEXTROSE 5 % IV SOLN
1.5000 g | Freq: Two times a day (BID) | INTRAVENOUS | Status: AC
  Administered 2016-12-19: 1.5 g via INTRAVENOUS
  Filled 2016-12-18: qty 1.5

## 2016-12-18 MED ORDER — ONDANSETRON HCL 4 MG/2ML IJ SOLN
INTRAMUSCULAR | Status: DC | PRN
  Administered 2016-12-18: 4 mg via INTRAVENOUS

## 2016-12-18 MED ORDER — SODIUM CHLORIDE 0.9 % IV SOLN
INTRAVENOUS | Status: DC | PRN
  Administered 2016-12-18: 500 mL

## 2016-12-18 MED ORDER — MORPHINE SULFATE (PF) 2 MG/ML IV SOLN: 2.0000 mg | INTRAVENOUS | Status: DC | PRN

## 2016-12-18 MED ORDER — HYDROMORPHONE HCL 1 MG/ML IJ SOLN: 0.2500 mg | INTRAMUSCULAR | Status: DC | PRN

## 2016-12-18 MED ORDER — OXYCODONE HCL 5 MG/5ML PO SOLN: 5.0000 mg | Freq: Once | ORAL | Status: DC | PRN

## 2016-12-18 SURGICAL SUPPLY — 57 items
ADH SKN CLS APL DERMABOND .7 (GAUZE/BANDAGES/DRESSINGS) ×1
AGENT HMST SPONGE THK3/8 (HEMOSTASIS) ×1
BANDAGE ESMARK 6X9 LF (GAUZE/BANDAGES/DRESSINGS) IMPLANT
BNDG CMPR 9X6 STRL LF SNTH (GAUZE/BANDAGES/DRESSINGS)
BNDG ESMARK 6X9 LF (GAUZE/BANDAGES/DRESSINGS)
CANISTER SUCT 3000ML PPV (MISCELLANEOUS) ×3 IMPLANT
CLIP LIGATING EXTRA MED SLVR (CLIP) ×2 IMPLANT
CLIP LIGATING EXTRA SM BLUE (MISCELLANEOUS) ×2 IMPLANT
CLIP TI MEDIUM 6 (CLIP) ×2 IMPLANT
CLIP TI WIDE RED SMALL 6 (CLIP) ×2 IMPLANT
CUFF TOURNIQUET SINGLE 18IN (TOURNIQUET CUFF) IMPLANT
CUFF TOURNIQUET SINGLE 24IN (TOURNIQUET CUFF) IMPLANT
CUFF TOURNIQUET SINGLE 34IN LL (TOURNIQUET CUFF) IMPLANT
CUFF TOURNIQUET SINGLE 44IN (TOURNIQUET CUFF) IMPLANT
DERMABOND ADVANCED (GAUZE/BANDAGES/DRESSINGS) ×2
DERMABOND ADVANCED .7 DNX12 (GAUZE/BANDAGES/DRESSINGS) IMPLANT
DRAIN CHANNEL 10M FLAT 3/4 FLT (DRAIN) ×2 IMPLANT
DRAIN SNY 10X20 3/4 PERF (WOUND CARE) IMPLANT
DRAPE ORTHO SPLIT 77X108 STRL (DRAPES) ×6
DRAPE SURG ORHT 6 SPLT 77X108 (DRAPES) IMPLANT
ELECT REM PT RETURN 9FT ADLT (ELECTROSURGICAL) ×3
ELECTRODE REM PT RTRN 9FT ADLT (ELECTROSURGICAL) ×1 IMPLANT
EVACUATOR SILICONE 100CC (DRAIN) ×2 IMPLANT
GAUZE SPONGE 4X4 12PLY STRL LF (GAUZE/BANDAGES/DRESSINGS) ×2 IMPLANT
GLOVE BIO SURGEON STRL SZ 6.5 (GLOVE) ×4 IMPLANT
GLOVE BIO SURGEON STRL SZ7.5 (GLOVE) ×5 IMPLANT
GLOVE BIO SURGEONS STRL SZ 6.5 (GLOVE) ×4
GLOVE BIOGEL PI IND STRL 7.0 (GLOVE) IMPLANT
GLOVE BIOGEL PI INDICATOR 7.0 (GLOVE) ×2
GOWN STRL REUS W/ TWL LRG LVL3 (GOWN DISPOSABLE) ×3 IMPLANT
GOWN STRL REUS W/TWL LRG LVL3 (GOWN DISPOSABLE) ×9
HEMOSTAT SPONGE AVITENE ULTRA (HEMOSTASIS) ×2 IMPLANT
KIT BASIN OR (CUSTOM PROCEDURE TRAY) ×3 IMPLANT
KIT ROOM TURNOVER OR (KITS) ×3 IMPLANT
LOOP VESSEL MAXI BLUE (MISCELLANEOUS) ×2 IMPLANT
LOOP VESSEL MINI RED (MISCELLANEOUS) ×2 IMPLANT
NS IRRIG 1000ML POUR BTL (IV SOLUTION) ×6 IMPLANT
PACK CV ACCESS (CUSTOM PROCEDURE TRAY) IMPLANT
PACK GENERAL/GYN (CUSTOM PROCEDURE TRAY) ×2 IMPLANT
PACK PERIPHERAL VASCULAR (CUSTOM PROCEDURE TRAY) IMPLANT
PACK UNIVERSAL I (CUSTOM PROCEDURE TRAY) IMPLANT
PAD ARMBOARD 7.5X6 YLW CONV (MISCELLANEOUS) ×6 IMPLANT
SPONGE SURGIFOAM ABS GEL 100 (HEMOSTASIS) IMPLANT
STAPLER VISISTAT 35W (STAPLE) IMPLANT
SUT ETHILON 3 0 PS 1 (SUTURE) IMPLANT
SUT PROLENE 5 0 C 1 24 (SUTURE) IMPLANT
SUT PROLENE 6 0 CC (SUTURE) ×2 IMPLANT
SUT PROLENE 7 0 BV 1 (SUTURE) ×2 IMPLANT
SUT SILK 3 0 (SUTURE) ×3
SUT SILK 3-0 18XBRD TIE 12 (SUTURE) IMPLANT
SUT VIC AB 2-0 CTX 36 (SUTURE) IMPLANT
SUT VIC AB 3-0 SH 27 (SUTURE) ×3
SUT VIC AB 3-0 SH 27X BRD (SUTURE) IMPLANT
SUT VIC AB 4-0 PS2 27 (SUTURE) ×2 IMPLANT
TAPE CLOTH SURG 4X10 WHT LF (GAUZE/BANDAGES/DRESSINGS) ×2 IMPLANT
UNDERPAD 30X30 (UNDERPADS AND DIAPERS) ×3 IMPLANT
WATER STERILE IRR 1000ML POUR (IV SOLUTION) ×3 IMPLANT

## 2016-12-18 SURGICAL SUPPLY — 37 items
ADH SKN CLS APL DERMABOND .7 (GAUZE/BANDAGES/DRESSINGS) ×1
CANISTER SUCT 3000ML PPV (MISCELLANEOUS) ×3 IMPLANT
CANNULA VESSEL 3MM 2 BLNT TIP (CANNULA) ×6 IMPLANT
CATH ROBINSON RED A/P 18FR (CATHETERS) ×3 IMPLANT
CLIP LIGATING EXTRA MED SLVR (CLIP) ×3 IMPLANT
CLIP LIGATING EXTRA SM BLUE (MISCELLANEOUS) ×3 IMPLANT
CRADLE DONUT ADULT HEAD (MISCELLANEOUS) ×3 IMPLANT
DECANTER SPIKE VIAL GLASS SM (MISCELLANEOUS) IMPLANT
DERMABOND ADVANCED (GAUZE/BANDAGES/DRESSINGS) ×2
DERMABOND ADVANCED .7 DNX12 (GAUZE/BANDAGES/DRESSINGS) ×1 IMPLANT
DRAIN HEMOVAC 1/8 X 5 (WOUND CARE) IMPLANT
ELECT REM PT RETURN 9FT ADLT (ELECTROSURGICAL) ×3
ELECTRODE REM PT RTRN 9FT ADLT (ELECTROSURGICAL) ×1 IMPLANT
EVACUATOR SILICONE 100CC (DRAIN) IMPLANT
GLOVE SS BIOGEL STRL SZ 7.5 (GLOVE) ×1 IMPLANT
GLOVE SUPERSENSE BIOGEL SZ 7.5 (GLOVE) ×2
GOWN STRL REUS W/ TWL LRG LVL3 (GOWN DISPOSABLE) ×3 IMPLANT
GOWN STRL REUS W/TWL LRG LVL3 (GOWN DISPOSABLE) ×9
KIT BASIN OR (CUSTOM PROCEDURE TRAY) ×3 IMPLANT
KIT ROOM TURNOVER OR (KITS) ×3 IMPLANT
KIT SHUNT ARGYLE CAROTID ART 6 (VASCULAR PRODUCTS) IMPLANT
NEEDLE 22X1 1/2 (OR ONLY) (NEEDLE) IMPLANT
NS IRRIG 1000ML POUR BTL (IV SOLUTION) ×6 IMPLANT
PACK CAROTID (CUSTOM PROCEDURE TRAY) ×3 IMPLANT
PAD ARMBOARD 7.5X6 YLW CONV (MISCELLANEOUS) ×6 IMPLANT
PATCH HEMASHIELD 8X75 (Vascular Products) ×2 IMPLANT
SHUNT CAROTID BYPASS 10 (VASCULAR PRODUCTS) ×2 IMPLANT
SHUNT CAROTID BYPASS 12FRX15.5 (VASCULAR PRODUCTS) IMPLANT
SUT ETHILON 3 0 PS 1 (SUTURE) IMPLANT
SUT PROLENE 6 0 CC (SUTURE) ×5 IMPLANT
SUT SILK 3 0 (SUTURE)
SUT SILK 3-0 18XBRD TIE 12 (SUTURE) IMPLANT
SUT VIC AB 3-0 SH 27 (SUTURE) ×6
SUT VIC AB 3-0 SH 27X BRD (SUTURE) ×2 IMPLANT
SUT VICRYL 4-0 PS2 18IN ABS (SUTURE) ×3 IMPLANT
SYR CONTROL 10ML LL (SYRINGE) IMPLANT
WATER STERILE IRR 1000ML POUR (IV SOLUTION) ×3 IMPLANT

## 2016-12-18 NOTE — Progress Notes (Signed)
CBG on I stat was 54 Dr Orene Desanctis called and informed. New orders noted.

## 2016-12-18 NOTE — H&P (View-Only) (Signed)
 [facility]  Patient name: Briana [name] MRN: [id number] DOB: 04/15/1946 Sex: female  REASON FOR VISIT: carotid follow-up  HPI:    Briana [name] is a 71 y.o. female who has a remote history of right carotid endarterectomy by Dr. Lawson and left carotid stenosis. She was last seen in the office on 04/16/2016. Given the depth and extensive calcified plaque of her carotids, her carotid duplex was felt to be a poor study. She was advised to follow up in 6 months with a CT angiogram for further visualization. She denies any episodes of amaurosis fugax, sudden onset weakness or numbness of the extremities or slurred speech.  She underwent recent pacemaker placement in June 2018 for bradycardia. She is on Coumadin for atrial fibrillation. She is on dialysis via her left upper arm fistula on Tuesdays, Thursdays and Saturdays.   Has a past medical history of CAD status post CABG in 2005. Her cardiologist is Dr. Koneswaran. Has history of hypertension, diabetes mellitus and congestive heart failure. She has never been a smoker.   PAST MEDICAL HISTORY:   Past Medical History:  Diagnosis Date  . Adenomatous colon polyp 09/2007  . Arthritis   . Atrial fibrillation with rapid ventricular response (HCC) 10/05/2015  . Bradycardia   . CAD (coronary artery disease)    a. s/p CABG 2005.  . Carotid artery disease (HCC)    a. s/p R CEA 2011. Followed by vascular -  duplex 04/16/16: <40% RICA, 40-59% LICA.  . Chronic anemia   . Chronic diastolic CHF (congestive heart failure) (HCC)   . Depression with anxiety   . Diabetes mellitus   . ESRD (end stage renal disease) (HCC)    T Th Sat  . Heart murmur   . Hyperlipidemia   . Hypertension   . Neuropathy   . PAD (peripheral artery disease) (HCC)   . Paroxysmal atrial flutter (HCC)   . Peripheral vascular disease (HCC)   . Pulmonary hypertension (HCC)   . Rheumatic fever   . Sleep apnea     Family History    Problem Relation Age of Onset  . Breast cancer Mother   . Heart disease Father   . Diabetes Father   . Heart disease Brother     Social History  Substance Use Topics  . Smoking status: Never Smoker  . Smokeless tobacco: Never Used  . Alcohol use No    Allergies  Allergen Reactions  . Tape Other (See Comments)    TAPE WILL CAUSE BLISTERS/SORES; PLEASE USE COBAN WRAP!!  . Gemfibrozil Other (See Comments)    Patient is not aware of this allergy but states that she had side effects to a cholesterol medication in the past  . Oxycodone Other (See Comments)    Hallucinations   . Carbamazepine Other (See Comments)    Reaction to tegretol - "loopy"  . Clonazepam Other (See Comments)    Sleepy   . Macrobid [Nitrofurantoin] Nausea And Vomiting    MEDICATIONS:   Current Outpatient Prescriptions  Medication Sig Dispense Refill  . amLODipine (NORVASC) 10 MG tablet Take 10 mg by mouth daily.    . aspirin EC 81 MG EC tablet Take 1 tablet (81 mg total) by mouth daily. 30 tablet 1  . atorvastatin (LIPITOR) 80 MG tablet Take 1 tablet (80 mg total) by mouth daily. 30 tablet 3  . blood glucose meter kit and supplies KIT Dispense based on patient and insurance preference. Use up   to four times daily as directed. (FOR ICD-10 E11.65) 1 each 0  . calcium acetate (PHOSLO) 667 MG capsule Take 2 capsules (1,334 mg total) by mouth 3 (three) times daily with meals. 180 capsule 1  . cetirizine (ZYRTEC) 10 MG tablet Take 10 mg by mouth daily.     . diazepam (VALIUM) 5 MG tablet Take 5 mg by mouth 3 (three) times daily as needed for anxiety.     . enoxaparin (LOVENOX) 100 MG/ML injection     . ferric citrate (AURYXIA) 1 GM 210 MG(Fe) tablet Take 210 mg by mouth 3 (three) times daily with meals.     . HYDROcodone-acetaminophen (NORCO/VICODIN) 5-325 MG tablet Take 1 tablet by mouth every 6 (six) hours as needed for moderate pain (Must last 30 days.Do not take and drive a car or use machinery.). 75 tablet  0  . Insulin Lispro Prot & Lispro (HUMALOG MIX 75/25 KWIKPEN) (75-25) 100 UNIT/ML Kwikpen Used 30 units with breakfast and 30 units with supper when pre-meal blood glucose is above 90 mg/dL. 10 pen 2  . nitroGLYCERIN (NITROSTAT) 0.4 MG SL tablet Place 1 tablet (0.4 mg total) under the tongue every 5 (five) minutes as needed for chest pain. Max three pills 45 tablet 0  . polyethylene glycol (MIRALAX / GLYCOLAX) packet Take 17 g by mouth 2 (two) times daily. (Patient taking differently: Take 17 g by mouth daily as needed for mild constipation. ) 14 each 0  . protein supplement (RESOURCE BENEPROTEIN) POWD Take 6 g by mouth 3 (three) times daily with meals.  0  . rOPINIRole (REQUIP) 0.25 MG tablet Take 1 tablet (0.25 mg total) by mouth at bedtime. 30 tablet 3  . sertraline (ZOLOFT) 100 MG tablet Take 200 mg by mouth daily.    . Vitamin D, Ergocalciferol, (DRISDOL) 50000 units CAPS capsule Take 50,000 Units by mouth every 7 (seven) days. THURSDAY    . warfarin (COUMADIN) 5 MG tablet TAKE AS DIRECTED (Patient taking differently: Take 5 mg by mouth once a day at 6 PM) 90 tablet 3   No current facility-administered medications for this visit.     REVIEW OF SYSTEMS:   REVIEW OF SYSTEMS (negative unless checked):   Cardiac:  [] Chest pain or chest pressure? [] Shortness of breath upon activity? [] Shortness of breath when lying flat? [x] Irregular heart rhythm?  Vascular:  [] Pain in calf, thigh, or hip brought on by walking? [] Pain in feet at night that wakes you up from your sleep? [] Blood clot in your veins? [] Leg swelling?  Pulmonary:  [] Oxygen at home? [] Productive cough? [] Wheezing?  Neurologic:  [] Sudden weakness in arms or legs? [] Sudden numbness in arms or legs? [] Sudden onset of difficult speaking or slurred speech? [] Temporary loss of vision in one eye? [] Problems with dizziness?  Gastrointestinal:  [] Blood in stool? [] Vomited blood?  Genitourinary:  []  Burning when urinating? [] Blood in urine?  Psychiatric:  [] Major depression  Hematologic:  [] Bleeding problems? [] Problems with blood clotting?  Dermatologic:  [] Rashes or ulcers?  Constitutional:  [] Fever or chills?  Ear/Nose/Throat:  [] Change in hearing? [] Nose bleeds? [] Sore throat?  Musculoskeletal:  [] Back pain? [] Joint pain? [] Muscle pain?  PHYSICAL EXAM:   Vitals:   12/10/16 1553  BP: (!) 128/54  Pulse: 80  Resp: 16  Temp: 98.1 F (36.7 C)  SpO2: 90%    Weight: 214 lb (97.1 kg)  Height: 5' 4" (1.626 m)    GENERAL: The patient is a well-nourished female, in no acute distress. The vital signs are documented above. HEENT: normocephalic, atraumatic. No abnormalities noted.  CARDIAC: There is a regular rate and rhythm. Bilateral carotid bruits. VASCULAR: Non-palpable left radial pulse. 2+ right radial pulse. Left upper arm fistula.  PULMONARY: There is good air exchange bilaterally without wheezing or rales. MUSCULOSKELETAL: There are no major deformities or cyanosis. NEUROLOGIC: No focal deficits. 5 out of 5 strength upper and lower external nares bilaterally. SKIN: There are no ulcers or rashes noted. PSYCHIATRIC: The patient has a normal affect.  DATA:    CTA neck 11/25/2016  Right carotid endarterectomy is patent. Critical greater than 90% stenosis left internal carotid artery.  ASSESSMENT/PLAN:   Asymptomatic high-grade left internal carotid artery stenosis  Discussed that the annual risk of stroke is 5% per year. Recommended left carotid endarterectomy for stroke prevention. She has been appointment tomorrow with Dr. Koneswaran for follow-up after her pacemaker placement. Dr. Shamona Wirtz to contact Dr. Koneswaran regarding any need for bridging with her coumadin and pre-operative cardiac evaluation. Discussed risk of bleeding, infection, cranial nerve damage, and stroke associated with procedure. The patient is wanting to get her surgery soon as  possible. This will be scheduled at the patient's convenience.  Kimberly Trinh, PA-C [facility]  [facility] MD: Tianna Baus  I have examined the patient, reviewed and agree with above.Discussed CT angiogram with the patient and her daughter present. Shows critical left carotid stenosis. Patient is right handed. She has extensive calcification therefore be a poor stent candidate. She has had recent pacemaker placement for bradycardia. She is to follow-up with cardiology tomorrow and will explained the need for upcoming carotid surgery for cardiac clearance. Also will discuss any need for bridging for Coumadin versus simply discontinue the several days prior to procedure related to her atrial fibrillation. Discussed procedure including risk of stroke with surgery and also cranial nerve injury. Patient and her daughter wish to proceed as soon as possible  Loria Lacina, MD 12/10/2016 4:41 PM  

## 2016-12-18 NOTE — Progress Notes (Addendum)
Hypoglycemic Event  CBG: 65  Treatment: D50 IV 25 mL  Symptoms: None  Follow-up CBG: Time:0720 CBG Result:80  Possible Reasons for Event: Other: Npo for surgery  Comments/MD notified:Dr Massagee     Briana [name], Briana [name]

## 2016-12-18 NOTE — Transfer of Care (Signed)
Immediate Anesthesia Transfer of Care Note  Patient: Briana [name]  Procedure(s) Performed: Procedure(s): ENDARTERECTOMY CAROTID (Left) PATCH ANGIOPLASTY WITH HEMASHIELD PLATIUM FINESSE PATCH (Left)  Patient Location: PACU  Anesthesia Type:General  Level of Consciousness: awake, alert  and patient cooperative  Airway & Oxygen Therapy: Patient Spontanous Breathing and Patient connected to nasal cannula oxygen  Post-op Assessment: Report given to RN, Post -op Vital signs reviewed and stable, Patient moving all extremities X 4 and Patient able to stick tongue midline  Post vital signs: Reviewed and stable  Last Vitals:  Vitals:   12/18/16 0543 12/18/16 1003  BP: (!) 173/59 (!) (P) 119/46  Pulse: 86 (P) 73  Resp: 18 (!) (P) 22  Temp: 36.7 C (P) 37.1 C    Last Pain:  Vitals:   12/18/16 1003  TempSrc:   PainSc: (P) 0-No pain      Patients Stated Pain Goal: 5 (99/77/41 4239)  Complications: No apparent anesthesia complications

## 2016-12-18 NOTE — Op Note (Signed)
Procedure: Evacuation of hematoma left neck  Preoperative diagnosis: Left neck hematoma  Postoperative diagnosis: Same  Anesthesia: Gen.  Assistant: Nurse  Operative findings: 3 x 5 cm hematoma left neck under pressure small amount of oozing adjacent to the vagus and ansa cervicalis, diffuse skin edge using no areas bleeding from carotid patch  Operative details: After informed consent, the patient taken the operating. The patient placed in supine position on the operating table. After induction of general anesthesia and endotracheal intubation, the patient's entire left neck and chest are prepped and draped in usual sterile fashion.  Next the pre-existing left neck incision was reopened. Under the subcutaneous tissues down level of platysma. The suture line for the platysma and sternal cleidomastoid muscle were opened. There is a hematoma that then evacuated under pressure. Hematoma was proximal 3 x 5 cm in diameter. This was all completely evacuated. The wound was thoroughly washed out with normal saline solution. There were 2 small spots of oozing 1 adjacent to the ansa cervicalis and 1 adjacent to the vagus nerve. These were repaired with a small clip and a single 6-0 Prolene suture respectively. The patch was inspected throughout its course and not really found to have any oozing whatsoever. The wound was then thoroughly irrigated once again. A 10 flat JP was brought out through a separate stab incision just below the neck and sutured to the skin with 3-0 nylon suture. Avitene was placed in the neck and direct pressure was held for about 5 minutes. The wound was then thoroughly irrigated once more. No obvious bleeding was occurring at this point. There was some skin edge using which was controlled with cautery. The platysma was then reapproximated using a running 3-0 Vicryl suture. The skin was closed with a 4-0 Vicryl subcuticular stitch. The patient tolerated procedure well and there were no  complications. Patient was awakened in the operating room and extubated in the operating room and taken the recovery room in stable condition.  [name], MD [facility] [place] [facility] Office: [phone] Pager: [phone]

## 2016-12-18 NOTE — Anesthesia Postprocedure Evaluation (Signed)
Anesthesia Post Note  Patient: Briana [name]  Procedure(s) Performed: Procedure(s) (LRB): EVACUATION of left neck HEMATOMA (Left)     Patient location during evaluation: PACU Anesthesia Type: General Level of consciousness: awake and alert Pain management: pain level controlled Vital Signs Assessment: post-procedure vital signs reviewed and stable Respiratory status: spontaneous breathing, nonlabored ventilation, respiratory function stable and patient connected to nasal cannula oxygen Cardiovascular status: blood pressure returned to baseline and stable Postop Assessment: no signs of nausea or vomiting Anesthetic complications: no Comments: Emergency surgery due to expanding neck hematoma.  Anti-emetics had been given shortly prior from previous case and not repeated.  Patient denies N/V in PACU.    Last Vitals:  Vitals:   12/18/16 1514 12/18/16 1530  BP: (!) 144/77 135/75  Pulse:  (!) 105  Resp:  13  Temp: 36.8 C     Last Pain:  Vitals:   12/18/16 1530  TempSrc:   PainSc: 0-No pain                 Catalina Gravel

## 2016-12-18 NOTE — Anesthesia Procedure Notes (Cosign Needed)
Procedure Name: Intubation Date/Time: 12/18/2016 7:40 AM Performed by: Rica Koyanagi Pre-anesthesia Checklist: Patient identified, Emergency Drugs available, Suction available and Patient being monitored Patient Re-evaluated:Patient Re-evaluated prior to induction Oxygen Delivery Method: Circle system utilized Preoxygenation: Pre-oxygenation with 100% oxygen Induction Type: IV induction Ventilation: Mask ventilation without difficulty Laryngoscope Size: Mac and 3 Grade View: Grade I Tube type: Oral Tube size: 7.0 mm Number of attempts: 1 Airway Equipment and Method: Stylet Placement Confirmation: ETT inserted through vocal cords under direct vision,  positive ETCO2 and breath sounds checked- equal and bilateral Secured at: 22 cm Tube secured with: Tape Dental Injury: Teeth and Oropharynx as per pre-operative assessment

## 2016-12-18 NOTE — Interval H&P Note (Signed)
History and Physical Interval Note:  12/18/2016 7:11 AM  Briana [name]  has presented today for surgery, with the diagnosis of Left Internal Carotid Artery Stenosis I65.22  The various methods of treatment have been discussed with the patient and family. After consideration of risks, benefits and other options for treatment, the patient has consented to  Procedure(s): ENDARTERECTOMY CAROTID (Left) as a surgical intervention .  The patient's history has been reviewed, patient examined, no change in status, stable for surgery.  I have reviewed the patient's chart and labs.  Questions were answered to the patient's satisfaction.     Curt Jews

## 2016-12-18 NOTE — Anesthesia Procedure Notes (Cosign Needed)
Arterial Line Insertion Start/End7/25/2018 7:10 AM Performed by: Rica Koyanagi, WALL, Nada Maclachlan, CRNA  Patient location: Pre-op. Preanesthetic checklist: patient identified, IV checked, site marked, risks and benefits discussed, surgical consent, monitors and equipment checked, pre-op evaluation and timeout performed Lidocaine 1% used for infiltration Right, radial was placed Catheter size: 20 G Hand hygiene performed , maximum sterile barriers used  and Seldinger technique used Allen's test indicative of satisfactory collateral circulation Attempts: 1 Procedure performed without using ultrasound guided technique. Ultrasound Notes:anatomy identified, needle tip was noted to be adjacent to the nerve/plexus identified and no ultrasound evidence of intravascular and/or intraneural injection Following insertion, dressing applied and Biopatch. Post procedure assessment: normal  Patient tolerated the procedure well with no immediate complications.

## 2016-12-18 NOTE — Progress Notes (Signed)
[place] for Lovenox / warfarin Indication: atrial fibrillation  Allergies  Allergen Reactions  . Tape Other (See Comments)    TAPE WILL CAUSE BLISTERS/SORES; PLEASE USE COBAN WRAP!!  . Gemfibrozil Other (See Comments)    Patient is not aware of this allergy but states that she had side effects to a cholesterol medication in the past  . Oxycodone Other (See Comments)    Hallucinations   . Carbamazepine Other (See Comments)    Reaction to tegretol - "loopy"  . Clonazepam Other (See Comments)    Sleepy   . Macrobid [Nitrofurantoin] Nausea And Vomiting    Patient Measurements: Height: 5\' 4"  (162.6 cm) Weight: 215 lb 9.8 oz (97.8 kg) IBW/kg (Calculated) : 54.7   Vital Signs: Temp: 98.3 F (36.8 C) (07/25 1514) Temp Source: Oral (07/25 1514) BP: 130/77 (07/25 1600) Pulse Rate: 105 (07/25 1600)  Labs:  Recent Labs  12/16/16 1055 12/18/16 0621 12/18/16 0625  HGB 11.0* 10.9*  --   HCT 36.5 32.0*  --   PLT 156  --   --   APTT 40*  --  42*  LABPROT 21.9*  --  17.3*  INR 1.88  --  1.40  CREATININE 3.24*  --   --     Estimated Creatinine Clearance: 18.1 mL/min (A) (by C-G formula based on SCr of 3.24 mg/dL (H)).   Medical History: Past Medical History:  Diagnosis Date  . Adenomatous colon polyp 09/2007  . Arthritis   . Atrial fibrillation with rapid ventricular response ([place]) 10/05/2015  . Bradycardia   . CAD (coronary artery disease)    a. s/p CABG 2005.  . Carotid artery disease ([place])    a. s/p R CEA 2011. Followed by vascular -  duplex 04/16/16: <63% RICA, 87-56% LICA.  Marland Kitchen Chronic anemia   . Chronic diastolic CHF (congestive heart failure) ([place])   . Depression with anxiety   . Diabetes mellitus   . ESRD (end stage renal disease) ([place])    T Th Sat  . Heart murmur   . Hyperlipidemia   . Hypertension   . Neuropathy   . PAD (peripheral artery disease) ([place])   . Paroxysmal atrial flutter ([place])   . Peripheral vascular  disease ([place])   . Pulmonary hypertension ([place])   . Rheumatic fever   . Sleep apnea      Assessment: Pt is on warfarin prior to admission for history of AFib. She is admitted currently for a L carotid endarterectomy, this was completed this morning. She had to go back to the OR this afternoon for evacuation of a L hematoma. Due to her elevated CHADsVASc score, she will be bridged with Lovenox. Lovenox and warfarin to start tomorrow.  PTA warfarin: 5 mg po daily  Goal of Therapy:  INR 2-3 Monitor platelets by anticoagulation protocol: Yes    Plan:  -Lovenox 100 mg [place] q24h beginning on 7/26 -Warfarin to begin on 7/26, not yet ordered  -Daily INR, CBC q72h -Discontinue Lovenox when INR > 2 -Monitor hematoma closely   Harvel Quale 12/18/2016,5:28 PM

## 2016-12-18 NOTE — Anesthesia Preprocedure Evaluation (Signed)
Anesthesia Evaluation  Patient identified by MRN, date of birth, ID band Patient awake    Reviewed: Allergy & Precautions, NPO status , Patient's Chart, lab work & pertinent test resultsPreop documentation limited or incomplete due to emergent nature of procedure.  History of Anesthesia Complications Negative for: history of anesthetic complications  Airway Mallampati: II  TM Distance: >3 FB Neck ROM: Full   Comment: Expanding left neck hematoma Dental  (+) Edentulous Upper   Pulmonary sleep apnea ,    breath sounds clear to auscultation       Cardiovascular hypertension, + CAD, + Past MI and +CHF  + dysrhythmias Atrial Fibrillation + Valvular Problems/Murmurs  Rhythm:Irregular Rate:Normal  Expanding neck hematoma s/p left CEA.   Neuro/Psych    GI/Hepatic   Endo/Other  diabetes  Renal/GU Renal disease     Musculoskeletal  (+) Arthritis ,   Abdominal (+) + obese,   Peds  Hematology  (+) anemia ,   Anesthesia Other Findings   Reproductive/Obstetrics                             Anesthesia Physical  Anesthesia Plan  ASA: IV and emergent  Anesthesia Plan: General   Post-op Pain Management:    Induction: Intravenous  PONV Risk Score and Plan: 4 or greater and Ondansetron, Dexamethasone, Propofol, Midazolam and Treatment may vary due to age or medical condition  Airway Management Planned: Oral ETT  Additional Equipment: Arterial line  Intra-op Plan:   Post-operative Plan: Extubation in OR  Informed Consent: I have reviewed the patients History and Physical, chart, labs and discussed the procedure including the risks, benefits and alternatives for the proposed anesthesia with the patient or authorized representative who has indicated his/her understanding and acceptance.   Dental advisory given  Plan Discussed with:   Anesthesia Plan Comments: (Emergency surgery due to expanding  neck hematoma s/p left CEA earlier today.  Patient brought emergently to OR.  History reviewed in chart.)        Anesthesia Quick Evaluation

## 2016-12-18 NOTE — Anesthesia Preprocedure Evaluation (Addendum)
Anesthesia Evaluation  Patient identified by MRN, date of birth, ID band Patient awake    Reviewed: Allergy & Precautions, NPO status , Patient's Chart, lab work & pertinent test results  History of Anesthesia Complications Negative for: history of anesthetic complications  Airway Mallampati: I  TM Distance: >3 FB Neck ROM: Full    Dental  (+) Edentulous Upper   Pulmonary sleep apnea ,    breath sounds clear to auscultation       Cardiovascular hypertension, + CAD, + Past MI and +CHF  + dysrhythmias Atrial Fibrillation + Valvular Problems/Murmurs  Rhythm:Irregular Rate:Normal     Neuro/Psych    GI/Hepatic   Endo/Other  diabetes  Renal/GU Renal disease     Musculoskeletal  (+) Arthritis ,   Abdominal (+) + obese,   Peds  Hematology  (+) anemia ,   Anesthesia Other Findings   Reproductive/Obstetrics                            Anesthesia Physical Anesthesia Plan  ASA: IV  Anesthesia Plan: General   Post-op Pain Management:    Induction: Intravenous  PONV Risk Score and Plan: 4 or greater and Ondansetron, Dexamethasone, Propofol, Midazolam, Scopolamine patch - Pre-op and Treatment may vary due to age or medical condition  Airway Management Planned: Oral ETT  Additional Equipment: Arterial line  Intra-op Plan:   Post-operative Plan: Extubation in OR  Informed Consent: I have reviewed the patients History and Physical, chart, labs and discussed the procedure including the risks, benefits and alternatives for the proposed anesthesia with the patient or authorized representative who has indicated his/her understanding and acceptance.   Dental advisory given  Plan Discussed with:   Anesthesia Plan Comments:        Anesthesia Quick Evaluation

## 2016-12-18 NOTE — Progress Notes (Signed)
PHARMACY NOTE:  ANTIMICROBIAL RENAL DOSAGE ADJUSTMENT  Current antimicrobial regimen includes a mismatch between antimicrobial dosage and estimated renal function.  As per policy approved by the Pharmacy & Therapeutics and Medical Executive Committees, the antimicrobial dosage will be adjusted accordingly.  Current antimicrobial dosage:  Cefuroxime 1.5g IV x2 doses post-op for surgical prophylaxis   Renal Function: HD on ESRD   Estimated Creatinine Clearance: 18.1 mL/min (A) (by C-G formula based on SCr of 3.24 mg/dL (H)). [x]      On intermittent HD, scheduled: no schedule at this time. On TuThSat outpatient  []      On CRRT    Antimicrobial dosage has been changed to:  Cefuroxime 1.5g IV x1 tomorrow afternoon, assuming she will have HD tomorrow. Pharmacy will follow.   Argie Ramming, PharmD Clinical Pharmacist 12/18/16 4:06 PM

## 2016-12-18 NOTE — Progress Notes (Signed)
Patient ID: Briana [name], female   DOB: [birth year], 71 y.o.   MRN: [id number] Comfortable in the step down unit. Minimal output JP drain. No recurrent hematoma.  Alert and oriented. Neurologically intact. 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  Daughter present in the room. Possible discharge in the a.m. versus Friday depending on how she feels

## 2016-12-18 NOTE — Op Note (Signed)
    OPERATIVE REPORT  DATE OF SURGERY: 12/18/2016  PATIENT: Briana [name], 71 y.o. female MRN: [id number]  DOB: Sep 12, 1945  PRE-OPERATIVE DIAGNOSIS: Severe asymptomatic left internal carotid artery stenosis  POST-OPERATIVE DIAGNOSIS:  Same  PROCEDURE: Left carotid endarterectomy and Dacron patch angioplasty  SURGEON:  Curt Jews, M.D.  PHYSICIAN ASSISTANT: Pervis Hocking [facility]  ANESTHESIA:  Gen.  EBL: 75 ml  Total I/O In: 800 [I.V.:800] Out: 75 [Blood:75]  BLOOD ADMINISTERED: None  DRAINS: None  SPECIMEN: None  COUNTS CORRECT:  YES  PLAN OF CARE: PACU neurologically intact   PATIENT DISPOSITION:  PACU - hemodynamically stable  PROCEDURE DETAILS: The patient was taken to the operative placed supine position where the area of the left neck f was prepped in the usual sterile fashion. An incision was made anterior to the sternocleidomastoid and carried down through the platysma with electrocautery. The patient had a relatively high bifurcation incision was at the just below the ear. The sternocleidomastoid reflected posteriorly and the carotid sheath was opened. The hypoglossal nerve and vagus nerves were divided and preserved. The common carotid artery was encircled with an umbilical tape. Dissection was continued onto the bifurcation. The superior thyroid artery was encircled with a 2-0 silk Potts tie. The external carotid was encircled with a blue vessel loop and the internal carotid was encircled with an umbilical tape and Rummel tourniquet. The patient was given 10,000 units intravenous heparin and after adequate circulation time the internal/external and common carotid arteries were occluded. The common carotid artery was opened with 11 blade and sent longitudinally with Potts scissors through the plaque onto the internal carotid artery. A 10 shunt was passed up the internal carotid artery and allowed to back bleed and then down the common carotid were secured with Rummel  tourniquet. The endarterectomy was begun on the common carotid artery and the plaque was divided proximally with Potts scissors. The endarterectomy was continued onto the bifurcation. The external carotid was endarterectomized with an eversion technique and the internal carotid was endarterectomized in an open fashion. Remaining atheromatous debris within was removed from the endarterectomy plane. S&S initial Dacron patch was brought onto the field and was sewn as a patch angioplasty with a running 6-0 Prolene suture. Prior to completion of the closure the shunt was removed and the usual flushing maneuvers were undertaken. Anastomosis was completed and flow was restored first to the external and then the internal carotid artery. There were excellent flow characteristics with hand-held Doppler in the internal and external carotid artery. The patient was given 100 mg of protamine to reverse the heparin. Wounds were irrigated with saline. Hemostasis tablet cautery. The wound was closed with several 3-0 Vicryl sutures to reapproximate the sternocleidomastoid over the carotid sheath. Next the platysma was closed with a running 3-0 Vicryl suture and finally the skin was closed with a Vicryl suture. Sterile dressing was applied and the patient was awakened neurologically intact in the operating room and transferred to the recovery room in stable condition   Rosetta Posner, M.D., [facility] 12/18/2016 10:18 AM

## 2016-12-18 NOTE — Anesthesia Procedure Notes (Signed)
Procedure Name: Intubation Date/Time: 12/18/2016 12:24 PM Performed by: Oletta Lamas Pre-anesthesia Checklist: Emergency Drugs available, Patient identified, Suction available and Patient being monitored Patient Re-evaluated:Patient Re-evaluated prior to induction Oxygen Delivery Method: Circle system utilized Preoxygenation: Pre-oxygenation with 100% oxygen Induction Type: IV induction and Rapid sequence Laryngoscope Size: Glidescope and 3 Grade View: Grade I Tube type: Oral Tube size: 7.5 mm Number of attempts: 1 Airway Equipment and Method: Rigid stylet and Video-laryngoscopy Placement Confirmation: ETT inserted through vocal cords under direct vision,  positive ETCO2 and breath sounds checked- equal and bilateral Secured at: 22 cm Tube secured with: Tape Dental Injury: Teeth and Oropharynx as per pre-operative assessment  Difficulty Due To: Difficulty was anticipated Comments: Difficulty anticipated due to possible hematoma from previous CEA

## 2016-12-18 NOTE — Transfer of Care (Signed)
Immediate Anesthesia Transfer of Care Note  Patient: Briana [name]  Procedure(s) Performed: Procedure(s): EVACUATION of left neck HEMATOMA (Left)  Patient Location: PACU  Anesthesia Type:General  Level of Consciousness: awake, alert  and oriented  Airway & Oxygen Therapy: Patient Spontanous Breathing and Patient connected to face mask oxygen  Post-op Assessment: Report given to RN, Post -op Vital signs reviewed and stable, Patient moving all extremities X 4 and Patient able to stick tongue midline  Post vital signs: Reviewed and stable  Last Vitals:  Vitals:   12/18/16 1148 12/18/16 1203  BP: (!) 152/93 135/67  Pulse: 80 (!) 108  Resp: 13 (!) 21  Temp:      Last Pain:  Vitals:   12/18/16 1145  TempSrc:   PainSc: Asleep      Patients Stated Pain Goal: 5 (05/69/79 4801)  Complications: No apparent anesthesia complications

## 2016-12-18 NOTE — Progress Notes (Signed)
  Day of Surgery Note    Subjective:  C/o soreness; denies trouble breathing  Vitals:   12/18/16 1118 12/18/16 1133  BP: 137/83 (!) 138/57  Pulse: 72 67  Resp: 16 17  Temp:      Incisions:   Clean; pt has increasing hematoma; she has some ecchymosis around the incision. Extremities:  Moving all extremities equally Lungs:  Non labored Neuro:  In tact; tongue is midline   Assessment/Plan:  This is a 71 y.o. female who is s/p left carotid endarterectomy  -pt with increasing hematoma over the past half hour.  Pt is not in acute distress, but will return to OR for evacuation of hematoma left neck.   Leontine Locket, PA-C 12/18/2016 12:07 PM [phone]

## 2016-12-18 NOTE — Progress Notes (Addendum)
Increased swelling noted to left neck. Briana [name], [place] paged. Dr. Donnetta Hutching made aware. States he will come to assess patient. Dr. Gifford Shave aware and to bedside.  Dr. Oneida Alar at bedside. Patient to be taken back to OR.

## 2016-12-19 ENCOUNTER — Telehealth: Payer: Self-pay | Admitting: Vascular Surgery

## 2016-12-19 ENCOUNTER — Encounter (HOSPITAL_COMMUNITY): Payer: Self-pay | Admitting: Vascular Surgery

## 2016-12-19 LAB — BASIC METABOLIC PANEL
Anion gap: 8 (ref 5–15)
BUN: 22 mg/dL — AB (ref 6–20)
CALCIUM: 8 mg/dL — AB (ref 8.9–10.3)
CO2: 28 mmol/L (ref 22–32)
Chloride: 99 mmol/L — ABNORMAL LOW (ref 101–111)
Creatinine, Ser: 3.19 mg/dL — ABNORMAL HIGH (ref 0.44–1.00)
GFR calc Af Amer: 16 mL/min — ABNORMAL LOW (ref >60)
GFR, EST NON AFRICAN AMERICAN: 14 mL/min — AB (ref >60)
GLUCOSE: 157 mg/dL — AB (ref 65–99)
Potassium: 4.2 mmol/L (ref 3.5–5.1)
SODIUM: 135 mmol/L (ref 135–145)

## 2016-12-19 LAB — GLUCOSE, CAPILLARY
GLUCOSE-CAPILLARY: 242 mg/dL — AB (ref 65–99)
Glucose-Capillary: 150 mg/dL — ABNORMAL HIGH (ref 65–99)
Glucose-Capillary: 329 mg/dL — ABNORMAL HIGH (ref 65–99)

## 2016-12-19 LAB — PROTIME-INR
INR: 1.35
Prothrombin Time: 16.8 seconds — ABNORMAL HIGH (ref 11.4–15.2)

## 2016-12-19 MED ORDER — HYDROCODONE-ACETAMINOPHEN 5-325 MG PO TABS: 1.0000 | ORAL_TABLET | Freq: Four times a day (QID) | ORAL | 0 refills | Status: DC | PRN

## 2016-12-19 MED ORDER — ENOXAPARIN SODIUM 100 MG/ML ~~LOC~~ SOLN: 100.0000 mg | SUBCUTANEOUS | Status: DC

## 2016-12-19 MED ORDER — LIDOCAINE-PRILOCAINE 2.5-2.5 % EX CREA: 1.0000 "application " | TOPICAL_CREAM | CUTANEOUS | Status: DC | PRN

## 2016-12-19 MED ORDER — HYDROCODONE-ACETAMINOPHEN 5-325 MG PO TABS
ORAL_TABLET | ORAL | Status: AC
  Filled 2016-12-19: qty 2

## 2016-12-19 MED ORDER — LIDOCAINE HCL (PF) 1 % IJ SOLN: 5.0000 mL | INTRAMUSCULAR | Status: DC | PRN

## 2016-12-19 MED ORDER — WARFARIN - PHARMACIST DOSING INPATIENT: Freq: Every day | Status: DC

## 2016-12-19 MED ORDER — HEPARIN SODIUM (PORCINE) 1000 UNIT/ML DIALYSIS: 1000.0000 [IU] | INTRAMUSCULAR | Status: DC | PRN

## 2016-12-19 MED ORDER — WARFARIN SODIUM 5 MG PO TABS
7.5000 mg | ORAL_TABLET | Freq: Once | ORAL | Status: AC
  Administered 2016-12-19: 7.5 mg via ORAL
  Filled 2016-12-19: qty 2

## 2016-12-19 MED ORDER — ALTEPLASE 2 MG IJ SOLR: 2.0000 mg | Freq: Once | INTRAMUSCULAR | Status: DC | PRN

## 2016-12-19 MED ORDER — SODIUM CHLORIDE 0.9 % IV SOLN: 100.0000 mL | INTRAVENOUS | Status: DC | PRN

## 2016-12-19 MED ORDER — PENTAFLUOROPROP-TETRAFLUOROETH EX AERO: 1.0000 "application " | INHALATION_SPRAY | CUTANEOUS | Status: DC | PRN

## 2016-12-19 NOTE — Progress Notes (Signed)
[place] for Lovenox / warfarin Indication: atrial fibrillation  Allergies  Allergen Reactions  . Tape Other (See Comments)    TAPE WILL CAUSE BLISTERS/SORES; PLEASE USE COBAN WRAP!!  . Gemfibrozil Other (See Comments)    Patient is not aware of this allergy but states that she had side effects to a cholesterol medication in the past  . Oxycodone Other (See Comments)    Hallucinations   . Carbamazepine Other (See Comments)    Reaction to tegretol - "loopy"  . Clonazepam Other (See Comments)    Sleepy   . Macrobid [Nitrofurantoin] Nausea And Vomiting    Patient Measurements: Height: 5\' 4"  (162.6 cm) Weight: 236 lb 1.6 oz (107.1 kg) IBW/kg (Calculated) : 54.7   Vital Signs: Temp: 98.8 F (37.1 C) (07/26 0401) Temp Source: Oral (07/26 0401) BP: 126/53 (07/26 0800) Pulse Rate: 109 (07/26 0800)  Labs:  Recent Labs  12/16/16 1055 12/18/16 0621 12/18/16 0625 12/19/16 0425  HGB 11.0* 10.9*  --   --   HCT 36.5 32.0*  --   --   PLT 156  --   --   --   APTT 40*  --  42*  --   LABPROT 21.9*  --  17.3*  --   INR 1.88  --  1.40  --   CREATININE 3.24*  --   --  3.19*    Estimated Creatinine Clearance: 19.3 mL/min (A) (by C-G formula based on SCr of 3.19 mg/dL (H)).   Medical History: Past Medical History:  Diagnosis Date  . Adenomatous colon polyp 09/2007  . Arthritis   . Atrial fibrillation with rapid ventricular response ([place]) 10/05/2015  . Bradycardia   . CAD (coronary artery disease)    a. s/p CABG 2005.  . Carotid artery disease ([place])    a. s/p R CEA 2011. Followed by vascular -  duplex 04/16/16: <46% RICA, 96-29% LICA.  Marland Kitchen Chronic anemia   . Chronic diastolic CHF (congestive heart failure) ([place])   . Depression with anxiety   . Diabetes mellitus   . ESRD (end stage renal disease) ([place])    T Th Sat  . Heart murmur   . Hyperlipidemia   . Hypertension   . Neuropathy   . PAD (peripheral artery disease) ([place])   .  Paroxysmal atrial flutter ([place])   . Peripheral vascular disease ([place])   . Pulmonary hypertension ([place])   . Rheumatic fever   . Sleep apnea     Assessment: Pt is on warfarin prior to admission for history of AFib. She is admitted currently for a L carotid endarterectomy, this was completed yesterday. She had to go back to the OR yesterday for evacuation of a L hematoma, and patient is ESRD on HD and surgery complicated by hematoma requiring second OR trip.   Due to her elevated CHADsVASc score, she will be bridged with Lovenox. Warfarin will be started tonight, and lovenox being held today and restarted tomorrow per pharmacy consult.   Outpatient anticoag visit on 7/18 will be followed as appropriate. INR yesterday 1.4, no INR today, ordered STAT INR.   PTA warfarin: 5 mg po daily (confirmed on last anticoag visit 7/18).  Goal of Therapy:  INR 2-3 Monitor platelets by anticoagulation protocol: Yes   Plan:  -Hold Lovenox 100 mg [place] q24h today, restart tomorrow. -Warfarin 7.5mg  today per vascular outpatient anticoag visit. -Daily INR, CBC q72h -Discontinue Lovenox when INR > 2 -Hematoma was evacuated, monitor for bleeding.  No s/sx bleeding currently.  Nida Boatman, PharmD PGY1 Acute Care Pharmacy Resident Pager: [phone] 12/19/2016,8:55 AM

## 2016-12-19 NOTE — Telephone Encounter (Signed)
-----   Message from Mena Goes, RN sent at 12/19/2016  8:59 AM EDT ----- Regarding: 2 weeks   ----- Message ----- From: Alvia Grove, PA-C Sent: 12/19/2016   7:38 AM To: Vvs Charge Pool  S/p left carotid endarterectomy, evacuation left neck hematoma 12/18/16  F/u with Dr. Donnetta Hutching in 2 weeks  Thanks Maudie Mercury

## 2016-12-19 NOTE — Progress Notes (Signed)
A-line removed per order and per protocol. Pressure dressing applied. Pt educated to call RN if she notes blood or swelling in her wrist. Pt educated bandage must not get wet and stay in place for 24h. Call bell and phone within reach. Will continue to monitor.

## 2016-12-19 NOTE — Anesthesia Postprocedure Evaluation (Signed)
Anesthesia Post Note  Patient: Briana [name]  Procedure(s) Performed: Procedure(s) (LRB): ENDARTERECTOMY CAROTID (Left) PATCH ANGIOPLASTY WITH HEMASHIELD PLATIUM FINESSE PATCH (Left)     Patient location during evaluation: PACU Anesthesia Type: General Level of consciousness: awake and sedated Pain management: pain level controlled Vital Signs Assessment: post-procedure vital signs reviewed and stable Respiratory status: spontaneous breathing, nonlabored ventilation, respiratory function stable and patient connected to nasal cannula oxygen Cardiovascular status: blood pressure returned to baseline and stable Postop Assessment: no signs of nausea or vomiting Anesthetic complications: no    Last Vitals:  Vitals:   12/19/16 0401 12/19/16 0800  BP: 126/72 (!) 126/53  Pulse: (!) 109 (!) 109  Resp: 19 18  Temp: 37.1 C     Last Pain:  Vitals:   12/19/16 0401  TempSrc: Oral  PainSc:                  Roshni Burbano,JAMES TERRILL

## 2016-12-19 NOTE — Consult Note (Signed)
Referring Provider: No ref. provider found Primary Care Physician:  Briana [name] Primary Nephrologist:  Briana [name]   Reason for Consultation:   Medical management of ESRD  Anemia  Hypertension and secondary HPT  HPI: 71 y.o. female who has a remote history of right carotid endarterectomy by Dr. Kellie Simmering and left carotid stenosis.She underwent recent pacemaker placement in June 2018 for bradycardia. She is on Coumadin for atrial fibrillation. She is on dialysis via her left upper arm fistula on Tuesdays, Thursdays and Saturdays. [place] for about 1 year She has had uneventful dialysis treatments with no issues reported from her access     past medical history of CAD status post CABG in 2005. Her cardiologist is Dr. Bronson Ing. Has history of hypertension, diabetes mellitus and congestive heart failure. She has never been a smoker.   Past Medical History:  Diagnosis Date  . Adenomatous colon polyp 09/2007  . Arthritis   . Atrial fibrillation with rapid ventricular response ([place]) 10/05/2015  . Bradycardia   . CAD (coronary artery disease)    a. s/p CABG 2005.  . Carotid artery disease ([place])    a. s/p R CEA 2011. Followed by vascular -  duplex 04/16/16: <16% RICA, 01-09% LICA.  Marland Kitchen Chronic anemia   . Chronic diastolic CHF (congestive heart failure) ([place])   . Depression with anxiety   . Diabetes mellitus   . ESRD (end stage renal disease) ([place])    T Th Sat  . Heart murmur   . Hyperlipidemia   . Hypertension   . Neuropathy   . PAD (peripheral artery disease) ([place])   . Paroxysmal atrial flutter ([place])   . Peripheral vascular disease ([place])   . Pulmonary hypertension ([place])   . Rheumatic fever   . Sleep apnea     Past Surgical History:  Procedure Laterality Date  . BASCILIC VEIN TRANSPOSITION Left 12/04/2015   Procedure: BASILIC VEIN TRANSPOSITION;  Surgeon: Rosetta Posner, MD;  Location: [facility];  Service: Vascular;  Laterality: Left;  . CAROTID ENDARTERECTOMY  10/04/2009   Right CEA   . CORONARY ARTERY BYPASS GRAFT  2005  . HEMATOMA EVACUATION Left 12/18/2016   Procedure: EVACUATION of left neck HEMATOMA;  Surgeon: Elam Dutch, MD;  Location: [facility];  Service: Vascular;  Laterality: Left;  . PACEMAKER IMPLANT N/A 11/06/2016   Procedure: Pacemaker Implant;  Surgeon: Evans Lance, MD;  Location: [facility];  Service: Cardiovascular;  Laterality: N/A;  . PERIPHERAL VASCULAR CATHETERIZATION N/A 06/14/2016   Procedure: A/V Fistulagram - Left Upper;  Surgeon: Elam Dutch, MD;  Location: [facility];  Service: Cardiovascular;  Laterality: N/A;  . POLYPECTOMY  02/25/2012   Procedure: POLYPECTOMY;  Surgeon: Danie Binder, MD;  Location: [facility];  Service: Endoscopy;  Laterality: N/A;  . TUBAL LIGATION      Prior to Admission medications   Medication Sig Start Date End Date Taking? Authorizing Provider  amLODipine (NORVASC) 10 MG tablet Take 10 mg by mouth daily.   Yes [provider]  aspirin EC 81 MG EC tablet Take 1 tablet (81 mg total) by mouth daily. 10/18/15  Yes Reyne Dumas, MD  atorvastatin (LIPITOR) 80 MG tablet Take 1 tablet (80 mg total) by mouth daily. 12/10/15  Yes Rama, Venetia Maxon, MD  calcium acetate (PHOSLO) 667 MG capsule Take 2 capsules (1,334 mg total) by mouth 3 (three) times daily with meals. 10/24/15  Yes Love, Ivan Anchors, PA-C  cetirizine (ZYRTEC) 10 MG  tablet Take 10 mg by mouth daily.    Yes [provider]  diazepam (VALIUM) 5 MG tablet Take 5 mg by mouth 3 (three) times daily as needed for anxiety.    Yes [provider]  enoxaparin (LOVENOX) 100 MG/ML injection Inject 1 mL (100 mg total) into the skin daily. At 6am 12/14/16  Yes Herminio Commons, MD  ferric citrate (AURYXIA) 1 GM 210 MG(Fe) tablet Take 210 mg by mouth 3 (three) times daily with meals.    Yes [provider]  Insulin Lispro Prot & Lispro (HUMALOG MIX 75/25 KWIKPEN) (75-25) 100 UNIT/ML Kwikpen Used 30 units with breakfast and 30  units with supper when pre-meal blood glucose is above 90 mg/dL. 11/12/16  Yes Nida, Marella Chimes, MD  nitroGLYCERIN (NITROSTAT) 0.4 MG SL tablet Place 1 tablet (0.4 mg total) under the tongue every 5 (five) minutes as needed for chest pain. Max three pills 10/24/15  Yes Love, Ivan Anchors, PA-C  polyethylene glycol (MIRALAX / GLYCOLAX) packet Take 17 g by mouth 2 (two) times daily. Patient taking differently: Take 17 g by mouth daily as needed for mild constipation.  12/10/15  Yes Rama, Venetia Maxon, MD  protein supplement (RESOURCE BENEPROTEIN) POWD Take 6 g by mouth 3 (three) times daily with meals. 10/24/15  Yes Love, Ivan Anchors, PA-C  rOPINIRole (REQUIP) 0.25 MG tablet Take 1 tablet (0.25 mg total) by mouth at bedtime. 12/10/15  Yes Rama, Venetia Maxon, MD  sertraline (ZOLOFT) 100 MG tablet Take 200 mg by mouth daily. 05/07/16  Yes [provider]  Vitamin D, Ergocalciferol, (DRISDOL) 50000 units CAPS capsule Take 50,000 Units by mouth every Thursday.    Yes [provider]  warfarin (COUMADIN) 5 MG tablet TAKE AS DIRECTED Patient taking differently: Take 7.5 mg by mouth daily on Monday and Thursday. Take 5 mg by mouth daily on all other days 07/10/16  Yes Satira Sark, MD  blood glucose meter kit and supplies KIT Dispense based on patient and insurance preference. Use up to four times daily as directed. (FOR ICD-10 E11.65) Patient not taking: Reported on 12/16/2016 09/18/16   Cassandria Anger, MD  HYDROcodone-acetaminophen (NORCO/VICODIN) 5-325 MG tablet Take 1 tablet by mouth every 6 (six) hours as needed for moderate pain (Must last 30 days.Do not take and drive a car or use machinery.). 12/19/16   Alvia Grove, PA-C    Current Facility-Administered Medications  Medication Dose Route Frequency Provider Last Rate Last Dose  . 0.9 %  sodium chloride infusion  500 mL Intravenous Once PRN Virgina Jock A, PA-C      . 0.9 %  sodium chloride infusion   Intravenous  Continuous Virgina Jock A, PA-C 10 mL/hr at 12/18/16 1018    . acetaminophen (TYLENOL) tablet 325-650 mg  325-650 mg Oral Q4H PRN Virgina Jock A, PA-C       Or  . acetaminophen (TYLENOL) suppository 325-650 mg  325-650 mg Rectal Q4H PRN Alvia Grove, PA-C      . alum & mag hydroxide-simeth (MAALOX/MYLANTA) 200-200-20 MG/5ML suspension 15-30 mL  15-30 mL Oral Q2H PRN Virgina Jock A, PA-C      . amLODipine (NORVASC) tablet 10 mg  10 mg Oral Daily Alvia Grove, PA-C      . aspirin EC tablet 81 mg  81 mg Oral Daily Trinh, Kimberly A, PA-C      . atorvastatin (LIPITOR) tablet 80 mg  80 mg Oral Daily Trinh, Kimberly A, PA-C      .  calcium acetate (PHOSLO) capsule 1,334 mg  1,334 mg Oral TID WC Trinh, Kimberly A, PA-C      . cefUROXime (ZINACEF) 1.5 g in dextrose 5 % 50 mL IVPB  1.5 g Intravenous Q12H Honor Loh, RPH      . diazepam (VALIUM) tablet 5 mg  5 mg Oral TID PRN Virgina Jock A, PA-C   5 mg at 12/18/16 2151  . docusate sodium (COLACE) capsule 100 mg  100 mg Oral Daily Alvia Grove, PA-C      . [START ON 12/20/2016] enoxaparin (LOVENOX) injection 100 mg  100 mg Subcutaneous Q24H Laren Everts, RPH      . ferric citrate (AURYXIA) tablet 210 mg  210 mg Oral TID WC Trinh, Kimberly A, PA-C      . guaiFENesin-dextromethorphan (ROBITUSSIN DM) 100-10 MG/5ML syrup 15 mL  15 mL Oral Q4H PRN Virgina Jock A, PA-C   15 mL at 12/18/16 2151  . hydrALAZINE (APRESOLINE) injection 5 mg  5 mg Intravenous Q20 Min PRN Alvia Grove, PA-C      . HYDROcodone-acetaminophen (NORCO/VICODIN) 5-325 MG per tablet 1-2 tablet  1-2 tablet Oral Q4H PRN Alvia Grove, PA-C   2 tablet at 12/18/16 2151  . insulin aspart (novoLOG) injection 0-9 Units  0-9 Units Subcutaneous TID WC Trinh, Kimberly A, PA-C      . labetalol (NORMODYNE,TRANDATE) injection 10 mg  10 mg Intravenous Q10 min PRN Virgina Jock A, PA-C   5 mg at 12/18/16 1159  . loratadine (CLARITIN) tablet 10 mg  10 mg Oral  Daily Virgina Jock A, PA-C      . magnesium sulfate IVPB 2 g 50 mL  2 g Intravenous Daily PRN Virgina Jock A, PA-C      . metoprolol tartrate (LOPRESSOR) injection 2-5 mg  2-5 mg Intravenous Q2H PRN Virgina Jock A, PA-C      . morphine 2 MG/ML injection 2-5 mg  2-5 mg Intravenous Q1H PRN Virgina Jock A, PA-C      . nitroGLYCERIN (NITROSTAT) SL tablet 0.4 mg  0.4 mg Sublingual Q5 min PRN Virgina Jock A, PA-C      . ondansetron (ZOFRAN) injection 4 mg  4 mg Intravenous Q6H PRN Trinh, Kimberly A, PA-C      . pantoprazole (PROTONIX) EC tablet 40 mg  40 mg Oral Daily Trinh, Kimberly A, PA-C      . phenol (CHLORASEPTIC) mouth spray 1 spray  1 spray Mouth/Throat PRN Virgina Jock A, PA-C   1 spray at 12/19/16 0214  . polyethylene glycol (MIRALAX / GLYCOLAX) packet 17 g  17 g Oral Daily PRN Virgina Jock A, PA-C      . potassium chloride SA (K-DUR,KLOR-CON) CR tablet 20-40 mEq  20-40 mEq Oral Daily PRN Virgina Jock A, PA-C      . protein supplement (RESOURCE BENEPROTEIN) powder 6 g  1 scoop Oral TID WC Trinh, Kimberly A, PA-C      . rOPINIRole (REQUIP) tablet 0.25 mg  0.25 mg Oral QHS Virgina Jock A, PA-C   0.25 mg at 12/18/16 2151  . sertraline (ZOLOFT) tablet 200 mg  200 mg Oral Daily Trinh, Kimberly A, PA-C      . Vitamin D (Ergocalciferol) (DRISDOL) capsule 50,000 Units  50,000 Units Oral Q Thu Trinh, Kimberly A, PA-C        Allergies as of 12/11/2016 - Review Complete 12/11/2016  Allergen Reaction Noted  . Tape Other (See Comments) 10/09/2016  . Gemfibrozil Other (See  Comments) 03/28/2015  . Oxycodone Other (See Comments) 11/04/2011  . Carbamazepine Other (See Comments) 03/28/2015  . Clonazepam Other (See Comments) 03/28/2015  . Macrobid [nitrofurantoin] Nausea And Vomiting 11/04/2011    Family History  Problem Relation Age of Onset  . Breast cancer Mother   . Heart disease Father   . Diabetes Father   . Heart disease Brother     Social History   Social  History  . Marital status: Married    Spouse name: N/A  . Number of children: N/A  . Years of education: N/A   Occupational History  . Not on file.   Social History Main Topics  . Smoking status: Never Smoker  . Smokeless tobacco: Never Used  . Alcohol use No  . Drug use: No  . Sexual activity: Not on file   Other Topics Concern  . Not on file   Social History Narrative  . No narrative on file    Review of Systems: Gen: Denies any fever, chills, sweats, anorexia, fatigue, weakness, malaise, weight loss, and sleep disorder HEENT: No visual complaints, No history of Retinopathy. Normal external appearance No Epistaxis or Sore throat. No sinusitis.   CV: Denies chest pain, angina, palpitations, syncope, orthopnea, PND, peripheral edema, and claudication. Resp: Denies dyspnea at rest, dyspnea with exercise, cough, sputum, wheezing, coughing up blood, and pleurisy. GI: Denies vomiting blood, jaundice, and fecal incontinence.   Denies dysphagia or odynophagia. GU : Denies urinary burning, blood in urine, urinary frequency, urinary hesitancy, nocturnal urination, and urinary incontinence.  No renal calculi. MS: Denies joint pain, limitation of movement, and swelling, stiffness, low back pain, extremity pain. Denies muscle weakness, cramps, atrophy.  No use of non steroidal antiinflammatory drugs. Derm: Denies rash, itching, dry skin, hives, moles, warts, or unhealing ulcers.  Psych: Denies depression, anxiety, memory loss, suicidal ideation, hallucinations, paranoia, and confusion. Heme: Denies bruising, bleeding, and enlarged lymph nodes. Neuro: No headache.  No diplopia. No dysarthria.  No dysphasia.  No history of CVA.  No Seizures. No paresthesias.  No weakness. Endocrine No DM.  No Thyroid disease.  No Adrenal disease.  Physical Exam: Vital signs in last 24 hours: Temp:  [98 F (36.7 C)-98.8 F (37.1 C)] 98.8 F (37.1 C) (07/26 0401) Pulse Rate:  [67-111] 109 (07/26  0800) Resp:  [13-25] 18 (07/26 0800) BP: (103-152)/(46-93) 126/53 (07/26 0800) SpO2:  [94 %-100 %] 98 % (07/26 0800) Arterial Line BP: (117-194)/(49-118) 156/66 (07/26 0800) Weight:  [215 lb 9.8 oz (97.8 kg)-236 lb 1.6 oz (107.1 kg)] 236 lb 1.6 oz (107.1 kg) (07/26 0401) Last BM Date: 12/17/16 General:   Alert,  Well-developed, well-nourished, pleasant and cooperative in NAD Head:  Normocephalic and atraumatic. Eyes:  Sclera clear, no icterus.   Conjunctiva pink. Ears:  Normal auditory acuity. Nose:  No deformity, discharge,  or lesions. Mouth:  No deformity or lesions, dentition normal. Neck:  Supple; no masses or thyromegaly. JVP not elevated Lungs:  Clear throughout to auscultation.   No wheezes, crackles, or rhonchi. No acute distress. Heart:  Regular rate and rhythm; no murmurs, clicks, rubs,  or gallops. Abdomen:  Soft, nontender and nondistended. No masses, hepatosplenomegaly or hernias noted. Normal bowel sounds, without guarding, and without rebound.   Msk:  Symmetrical without gross deformities. Normal posture. Pulses:  No carotid, renal, femoral bruits. DP and PT symmetrical and equal Extremities:  Without clubbing or edema. AVF thrill and bruit Neurologic:  Alert and  oriented x4;  grossly normal neurologically. Skin:  Intact without significant lesions or rashes. Cervical Nodes:  No significant cervical adenopathy. Psych:  Alert and cooperative. Normal mood and affect.  Intake/Output from previous day: 07/25 0701 - 07/26 0700 In: 1185.5 [I.V.:1185.5] Out: 330 [Urine:150; Drains:30; Blood:150] Intake/Output this shift: No intake/output data recorded.  Lab Results:  Recent Labs  12/16/16 1055 12/18/16 0621  WBC 5.1  --   HGB 11.0* 10.9*  HCT 36.5 32.0*  PLT 156  --    BMET  Recent Labs  12/16/16 1055 12/18/16 0621 12/19/16 0425  NA 138 140 135  K 3.9 3.1* 4.2  CL 100*  --  99*  CO2 30  --  28  GLUCOSE 159* 65 157*  BUN 26*  --  22*  CREATININE 3.24*   --  3.19*  CALCIUM 9.3  --  8.0*   LFT  Recent Labs  12/16/16 1055  PROT 6.6  ALBUMIN 3.8  AST 20  ALT 13*  ALKPHOS 75  BILITOT 0.9   PT/INR  Recent Labs  12/16/16 1055 12/18/16 0625  LABPROT 21.9* 17.3*  INR 1.88 1.40   Hepatitis Panel No results for input(s): HEPBSAG, HCVAB, HEPAIGM, HEPBIGM in the last 72 hours.  Studies/Results: No results found.  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

## 2016-12-19 NOTE — Progress Notes (Signed)
Subjective: Interval History: none.. Comfortable this morning. Sitting up in chair.  Objective: Vital signs in last 24 hours: Temp:  [98 F (36.7 C)-98.8 F (37.1 C)] 98.8 F (37.1 C) (07/26 0401) Pulse Rate:  [67-111] 109 (07/26 0401) Resp:  [13-25] 19 (07/26 0401) BP: (103-152)/(46-93) 126/72 (07/26 0401) SpO2:  [94 %-100 %] 98 % (07/26 0401) Arterial Line BP: (117-194)/(49-118) 140/59 (07/25 1800) Weight:  [215 lb 9.8 oz (97.8 kg)-236 lb 1.6 oz (107.1 kg)] 236 lb 1.6 oz (107.1 kg) (07/26 0401)  Intake/Output from previous day: 07/25 0701 - 07/26 0700 In: 1185.5 [I.V.:1185.5] Out: 330 [Urine:150; Drains:30; Blood:150] Intake/Output this shift: No intake/output data recorded.  Bruising but no hematoma at her left neck incision. JP drain present with minimal output overnight. 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  Lab Results:  Recent Labs  12/16/16 1055 12/18/16 0621  WBC 5.1  --   HGB 11.0* 10.9*  HCT 36.5 32.0*  PLT 156  --    BMET  Recent Labs  12/16/16 1055 12/18/16 0621 12/19/16 0425  NA 138 140 135  K 3.9 3.1* 4.2  CL 100*  --  99*  CO2 30  --  28  GLUCOSE 159* 65 157*  BUN 26*  --  22*  CREATININE 3.24*  --  3.19*  CALCIUM 9.3  --  8.0*    Studies/Results: Ct Angio Neck W Or Wo Contrast  Result Date: 11/25/2016 CLINICAL DATA:  Evaluate carotid stenosis. History of RIGHT carotid endarterectomy, hypertension, hyperlipidemia and diabetes. EXAM: CT ANGIOGRAPHY NECK TECHNIQUE: Multidetector CT imaging of the neck was performed using the standard protocol during bolus administration of intravenous contrast. Multiplanar CT image reconstructions and MIPs were obtained to evaluate the vascular anatomy. Carotid stenosis measurements (when applicable) are obtained utilizing NASCET criteria, using the distal internal carotid diameter as the denominator. CONTRAST:  75 cc Isovue 3 7 COMPARISON:  Carotid ultrasound December 27, 2015 FINDINGS: Pulmonary arterial  contrast phase limits evaluation. AORTIC ARCH: Included thoracic aorta is normal in course and caliber with moderate calcific atherosclerosis. Two vessel aortic arch. Due to bolus timing, limited assessment of arch vessel origins. Moderate probable stenosis LEFT Common carotid artery origin. RIGHT Common carotid artery origin predominately obscured by streak artifact from retained subclavian venous contrast. RIGHT CAROTID SYSTEM: Common carotid artery is widely patent, coursing in a straight line fashion. Circumferential calcific atherosclerosis RIGHT carotid bifurcation. Retropharyngeal course RIGHT internal carotid artery. No hemodynamically significant stenosis by NASCET criteria.Severe calcific atherosclerosis carotid siphon. LEFT CAROTID SYSTEM: Common carotid artery is widely patent, coursing in a straight line fashion. Short segment of probable critical stenosis LEFT internal carotid artery origin due to the calcific atherosclerosis, limited by bolus timing. LEFT internal carotid artery tonsillar loop. Severe calcific atherosclerosis carotid siphon. VERTEBRAL ARTERIES:Vertebral artery vessel origins not well characterized due to bolus timing. Patent intraforaminal vertebral artery's through the vertebrobasilar junction. Punctate central calcification vertebrobasilar junction. SKELETON: No acute osseous process though bone windows have not been submitted. Status post median sternotomy. Chronic severe RIGHT sphenoid sinusitis. Multilevel moderate to severe degenerative change of the cervical spine. Patient is edentulous. OTHER NECK: Soft tissues of the neck are nonacute though, not tailored for evaluation. Main pulmonary artery is enlarged at 4 cm, associated with chronic pulmonary arterial hypertension. Streak artifact from RIGHT cardiac pacemaker. Mild mediastinal lymphadenopathy with reniform morphology, 13 mm precarinal lymph node. IMPRESSION: 1. Limited assessment due to contrast bolus timing. 2. Suspected  critical stenosis LEFT internal carotid artery origin. 3. Atherosclerosis  resulting in less than 50% stenosis RIGHT internal carotid artery. 4. Moderate probable stenosis LEFT Common carotid artery origin. 5. Vertebral basilar calcification could result in stenosis. Severe atherosclerosis carotid siphons. Recommend CTA HEAD on nonemergent basis. 6. Mild mediastinal lymphadenopathy. Electronically Signed   By: Elon Alas M.D.   On: 11/25/2016 18:46   Anti-infectives: Anti-infectives    Start     Dose/Rate Route Frequency Ordered Stop   12/19/16 1800  cefUROXime (ZINACEF) 1.5 g in dextrose 5 % 50 mL IVPB     1.5 g 100 mL/hr over 30 Minutes Intravenous Every 12 hours 12/18/16 1601 12/20/16 0559   12/18/16 2000  cefUROXime (ZINACEF) 1.5 g in dextrose 5 % 50 mL IVPB  Status:  Discontinued     1.5 g 100 mL/hr over 30 Minutes Intravenous Every 12 hours 12/18/16 1527 12/18/16 1601   12/18/16 0555  cefUROXime (ZINACEF) 1.5 g in dextrose 5 % 50 mL IVPB     1.5 g 100 mL/hr over 30 Minutes Intravenous 30 min pre-op 12/18/16 0555 12/18/16 0750      Assessment/Plan: s/p Procedure(s): EVACUATION of left neck HEMATOMA (Left) Stable postop day 1. Will arrange hemodialysis today. Remove A-line and mobilized. Plan discharge in a.m. if she remains comfortable.   LOS: 1 day   Curt Jews 12/19/2016, 7:50 AM

## 2016-12-19 NOTE — Telephone Encounter (Signed)
Spoke to daughter to sch appt 01/01/17 at 9:30.

## 2016-12-19 NOTE — Progress Notes (Signed)
  Progress Note  SUBJECTIVE:    POD #1  Throat is sore.  OBJECTIVE:   Vitals:   12/19/16 0009 12/19/16 0401  BP: 118/60 126/72  Pulse: (!) 111 (!) 109  Resp: 17 19  Temp: 98.7 F (37.1 C) 98.8 F (37.1 C)    Intake/Output Summary (Last 24 hours) at 12/19/16 0728 Last data filed at 12/19/16 0659  Gross per 24 hour  Intake           1185.5 ml  Output              330 ml  Net            855.5 ml   Left neck incision intact. Ecchymosis present but no hematoma. <10cc output in JP. Left marginal mandibular neuropraxia. Tongue midline. 5/5 strength upper and lower extremities bilaterally.   ASSESSMENT/PLAN:   71 y.o. female is s/p: left carotid endarterectomy, developed hematoma post-op requiring evacuation of hematoma 1 Day Post-Op   Neuro exam intact except for left marginal mandibular neuropraxia. Total 30 cc JP output yesterday. Will ask Dr. Donnetta Hutching when to d/c as she will need to restart her lovenox and warfarin back at some point. Will ask Dr. Donnetta Hutching when to restart anticoagulation.  Had some issues with swallowing cheeseburger last night. No issues with liquids. Says her throat is sore. Will see how she does with solid foods this am.  ESRD: normally dialyzes am in [place]. If she is able to tolerate breakfast, may potentially be able to go to HD this afternoon, otherwise will call renal to dialyze today.    Briana [name] 12/19/2016 7:28 AM -- LABS:   CBC    Component Value Date/Time   WBC 5.1 12/16/2016 1055   HGB 10.9 (L) 12/18/2016 0621   HCT 32.0 (L) 12/18/2016 0621   PLT 156 12/16/2016 1055    BMET    Component Value Date/Time   NA 135 12/19/2016 0425   K 4.2 12/19/2016 0425   CL 99 (L) 12/19/2016 0425   CO2 28 12/19/2016 0425   GLUCOSE 157 (H) 12/19/2016 0425   BUN 22 (H) 12/19/2016 0425   CREATININE 3.19 (H) 12/19/2016 0425   CREATININE 3.50 (H) 04/25/2016 1614   CALCIUM 8.0 (L) 12/19/2016 0425   GFRNONAA 14 (L) 12/19/2016 0425   GFRAA 16 (L)  12/19/2016 0425    COAG Lab Results  Component Value Date   INR 1.40 12/18/2016   INR 1.88 12/16/2016   INR 2.9 12/09/2016   No results found for: PTT  ANTIBIOTICS:   Anti-infectives    Start     Dose/Rate Route Frequency Ordered Stop   12/19/16 1800  cefUROXime (ZINACEF) 1.5 g in dextrose 5 % 50 mL IVPB     1.5 g 100 mL/hr over 30 Minutes Intravenous Every 12 hours 12/18/16 1601 12/20/16 0559   12/18/16 2000  cefUROXime (ZINACEF) 1.5 g in dextrose 5 % 50 mL IVPB  Status:  Discontinued     1.5 g 100 mL/hr over 30 Minutes Intravenous Every 12 hours 12/18/16 1527 12/18/16 1601   12/18/16 0555  cefUROXime (ZINACEF) 1.5 g in dextrose 5 % 50 mL IVPB     1.5 g 100 mL/hr over 30 Minutes Intravenous 30 min pre-op 12/18/16 0555 12/18/16 0750       Virgina Jock, PA-C [facility] Office: [phone] Pager: [phone] 12/19/2016 7:28 AM

## 2016-12-20 LAB — PROTIME-INR
INR: 1.34
Prothrombin Time: 16.7 seconds — ABNORMAL HIGH (ref 11.4–15.2)

## 2016-12-20 LAB — GLUCOSE, CAPILLARY: Glucose-Capillary: 243 mg/dL — ABNORMAL HIGH (ref 65–99)

## 2016-12-20 MED ORDER — POLYETHYLENE GLYCOL 3350 17 G PO PACK
17.0000 g | PACK | Freq: Every day | ORAL | Status: DC | PRN
Start: 2016-12-20 — End: 2017-12-02

## 2016-12-20 MED ORDER — WARFARIN SODIUM 5 MG PO TABS: ORAL_TABLET | ORAL | 3 refills | Status: DC

## 2016-12-20 MED ORDER — ENOXAPARIN SODIUM 100 MG/ML ~~LOC~~ SOLN: 100.0000 mg | SUBCUTANEOUS | Status: DC

## 2016-12-20 NOTE — Progress Notes (Addendum)
  Progress Note  SUBJECTIVE:    POD #2  No complaints. Ready to go home.   OBJECTIVE:   Vitals:   12/20/16 0500 12/20/16 0602  BP: (!) 142/46 (!) 136/49  Pulse: 62 (!) 55  Resp: 16 18  Temp: 98.4 F (36.9 C) 98.2 F (36.8 C)    Intake/Output Summary (Last 24 hours) at 12/20/16 0752 Last data filed at 12/20/16 0300  Gross per 24 hour  Intake            611.5 ml  Output             3015 ml  Net          -2403.5 ml   Left neck with ecchymosis but no hematoma.  Left marginal mandibular neuropraxia. Tongue midline. Smile symmetric.  Equal strength upper and lower extremities bilaterally.   ASSESSMENT/PLAN:   71 y.o. female is s/p: left carotid endarterectomy, evacuation of left neck hematoma.  2 Days Post-Op   Stable this am. Tolerating diet without difficulty.  Neuro exam intact.  D/c home today.   Alvia Grove 12/20/2016 7:52 AM -- LABS:   CBC    Component Value Date/Time   WBC 5.1 12/16/2016 1055   HGB 10.9 (L) 12/18/2016 0621   HCT 32.0 (L) 12/18/2016 0621   PLT 156 12/16/2016 1055    BMET    Component Value Date/Time   NA 135 12/19/2016 0425   K 4.2 12/19/2016 0425   CL 99 (L) 12/19/2016 0425   CO2 28 12/19/2016 0425   GLUCOSE 157 (H) 12/19/2016 0425   BUN 22 (H) 12/19/2016 0425   CREATININE 3.19 (H) 12/19/2016 0425   CREATININE 3.50 (H) 04/25/2016 1614   CALCIUM 8.0 (L) 12/19/2016 0425   GFRNONAA 14 (L) 12/19/2016 0425   GFRAA 16 (L) 12/19/2016 0425    COAG Lab Results  Component Value Date   INR 1.34 12/20/2016   INR 1.35 12/19/2016   INR 1.40 12/18/2016   No results found for: PTT  ANTIBIOTICS:   Anti-infectives    Start     Dose/Rate Route Frequency Ordered Stop   12/19/16 1800  cefUROXime (ZINACEF) 1.5 g in dextrose 5 % 50 mL IVPB     1.5 g 100 mL/hr over 30 Minutes Intravenous Every 12 hours 12/18/16 1601 12/19/16 1958   12/18/16 2000  cefUROXime (ZINACEF) 1.5 g in dextrose 5 % 50 mL IVPB  Status:  Discontinued     1.5  g 100 mL/hr over 30 Minutes Intravenous Every 12 hours 12/18/16 1527 12/18/16 1601   12/18/16 0555  cefUROXime (ZINACEF) 1.5 g in dextrose 5 % 50 mL IVPB     1.5 g 100 mL/hr over 30 Minutes Intravenous 30 min pre-op 12/18/16 0555 12/18/16 0750       Virgina Jock, PA-C [facility] Office: [phone] Pager: [phone] 12/20/2016 7:52 AM   I have examined the patient, reviewed and agree with above.  Curt Jews, MD 12/20/2016 8:12 AM

## 2016-12-20 NOTE — Discharge Instructions (Signed)
Please have your home health RN check your INR on Monday. Continue your lovenox injections and coumadin as instructed.      [facility]  Discharge Instructions   Carotid Endarterectomy (CEA)  Please refer to the following instructions for your post-procedure care. Your surgeon or physician assistant will discuss any changes with you.  Activity  You are encouraged to walk as much as you can. You can slowly return to normal activities but must avoid strenuous activity and heavy lifting until your doctor tell you it's OK. Avoid activities such as vacuuming or swinging a golf club. You can drive after one week if you are comfortable and you are no longer taking prescription pain medications. It is normal to feel tired for serval weeks after your surgery. It is also normal to have difficulty with sleep habits, eating, and bowel movements after surgery. These will go away with time.  Bathing/Showering  You may shower after you go home. Do not soak in a bathtub, hot tub, or swim until the incision heals completely.  Incision Care  Shower every day. Clean your incision with mild soap and water. Pat the area dry with a clean towel. You do not need a bandage unless otherwise instructed. Do not apply any ointments or creams to your incision. You may have skin glue on your incision. Do not peel it off. It will come off on its own in about one week. Your incision may feel thickened and raised for several weeks after your surgery. This is normal and the skin will soften over time. For Men Only: It's OK to shave around the incision but do not shave the incision itself for 2 weeks. It is common to have numbness under your chin that could last for several months.  Diet  Resume your normal diet. There are no special food restrictions following this procedure. A low fat/low cholesterol diet is recommended for all patients with vascular disease. In order to heal from your  surgery, it is CRITICAL to get adequate nutrition. Your body requires vitamins, minerals, and protein. Vegetables are the best source of vitamins and minerals. Vegetables also provide the perfect balance of protein. Processed food has little nutritional value, so try to avoid this.        Medications  Resume taking all of your medications unless your doctor or physician assistant tells you not to. If your incision is causing pain, you may take over-the- counter pain relievers such as acetaminophen (Tylenol). If you were prescribed a stronger pain medication, please be aware these medications can cause nausea and constipation. Prevent nausea by taking the medication with a snack or meal. Avoid constipation by drinking plenty of fluids and eating foods with a high amount of fiber, such as fruits, vegetables, and grains. Do not take Tylenol if you are taking prescription pain medications.  Follow Up  Our office will schedule a follow up appointment 2-3 weeks following discharge.  Please call us immediately for any of the following conditions  Increased pain, redness, drainage (pus) from your incision site. Fever of 101 degrees or higher. If you should develop stroke (slurred speech, difficulty swallowing, weakness on one side of your body, loss of vision) you should call 911 and go to the nearest emergency room.  Reduce your risk of vascular disease:  Stop smoking. If you would like help call [facility] at 1-800-QUIT-NOW (217) 321-8477) or [place] at 9146074948. Manage your cholesterol Maintain a desired weight Control your diabetes Keep your  blood pressure down  If you have any questions, please call the office at [phone].

## 2016-12-20 NOTE — Care Management Note (Signed)
Case Management Note Marvetta Gibbons RN, BSN Unit 4E-Case Manager [phone]  Patient Details  Name: Briana [name] MRN: [id number] Date of Birth: [birth year]  Subjective/Objective:   Pt admitted s/p CEA                Action/Plan: PTA pt  Lived at home with spouse has cane and RW at home already- pt was active with [facility] for [facility]- no resumption order written for discharge for [facility] services  Expected Discharge Date:  12/20/16               Expected Discharge Plan:  [place]  In-House Referral:     Discharge planning Services  CM Consult  Post Acute Care Choice:  [place], Resumption of Svcs/PTA Provider Choice offered to:  Patient  DME Arranged:    DME Agency:     HH Arranged:    [place] Agency:  [place]  Status of Service:  Completed, signed off  If discussed at [place] of Stay Meetings, dates discussed:    Discharge Disposition: home/self care   Additional Comments:  Dawayne Patricia, RN 12/20/2016, 12:40 PM

## 2016-12-20 NOTE — Progress Notes (Signed)
[place] for Lovenox / warfarin Indication: atrial fibrillation  Allergies  Allergen Reactions  . Tape Other (See Comments)    TAPE WILL CAUSE BLISTERS/SORES; PLEASE USE COBAN WRAP!!  . Gemfibrozil Other (See Comments)    Patient is not aware of this allergy but states that she had side effects to a cholesterol medication in the past  . Oxycodone Other (See Comments)    Hallucinations   . Carbamazepine Other (See Comments)    Reaction to tegretol - "loopy"  . Clonazepam Other (See Comments)    Sleepy   . Macrobid [Nitrofurantoin] Nausea And Vomiting    Patient Measurements: Height: 5\' 4"  (162.6 cm) Weight: 220 lb 0.3 oz (99.8 kg) IBW/kg (Calculated) : 54.7   Vital Signs: Temp: 98.2 F (36.8 C) (07/27 0602) Temp Source: Oral (07/27 0602) BP: 136/49 (07/27 0602) Pulse Rate: 55 (07/27 0602)  Labs:  Recent Labs  12/18/16 0037 12/18/16 0488 12/19/16 0425 12/19/16 0934 12/20/16 0227  HGB 10.9*  --   --   --   --   HCT 32.0*  --   --   --   --   APTT  --  42*  --   --   --   LABPROT  --  17.3*  --  16.8* 16.7*  INR  --  1.40  --  1.35 1.34  CREATININE  --   --  3.19*  --   --     Estimated Creatinine Clearance: 18.6 mL/min (A) (by C-G formula based on SCr of 3.19 mg/dL (H)).   Medical History: Past Medical History:  Diagnosis Date  . Adenomatous colon polyp 09/2007  . Arthritis   . Atrial fibrillation with rapid ventricular response ([place]) 10/05/2015  . Bradycardia   . CAD (coronary artery disease)    a. s/p CABG 2005.  . Carotid artery disease ([place])    a. s/p R CEA 2011. Followed by vascular -  duplex 04/16/16: <89% RICA, 16-94% LICA.  Marland Kitchen Chronic anemia   . Chronic diastolic CHF (congestive heart failure) ([place])   . Depression with anxiety   . Diabetes mellitus   . ESRD (end stage renal disease) ([place])    T Th Sat  . Heart murmur   . Hyperlipidemia   . Hypertension   . Neuropathy   . PAD (peripheral artery disease)  ([place])   . Paroxysmal atrial flutter ([place])   . Peripheral vascular disease ([place])   . Pulmonary hypertension ([place])   . Rheumatic fever   . Sleep apnea     Assessment: Pt is on warfarin prior to admission for history of AFib. She s/p L carotid endarterectomy followed by OR on 7/25 for evacuation of a L hematoma. Pharmacy consulted for lovenox and coumadin. Plans for discharge today -INR= 1.34  PTA warfarin: 5 mg po daily (confirmed on last anticoag visit 7/18).  Goal of Therapy:  INR 2-3 Monitor platelets by anticoagulation protocol: Yes   Plan:  - Lovenox 100 mg [place] q24h  -Consider coumadin 7.5mg  for 2 more days then 5mg  daily -Daily INR, CBC q72h while inpatient -Discontinue Lovenox when INR > 2  Hildred Laser, Pharm D 12/20/2016 8:43 AM

## 2016-12-20 NOTE — Discharge Summary (Signed)
[facility] Discharge Summary  Briana [name] 1946/02/24 71 y.o. female  [id number]  Admission Date: 12/18/2016  Discharge Date: 12/20/2016  Physician: Curt Jews, MD  Admission Diagnosis: Left Internal Carotid Artery Stenosis I65.22 Hematoma  HPI:   This is a 71 y.o. female who has a remote history of right carotid endarterectomy by Dr. Kellie Simmering and left carotid stenosis. She was last seen in the office on 04/16/2016. Given the depth and extensive calcified plaque of her carotids, her carotid duplex was felt to be a poor study. She was advised to follow up in 6 months with a CT angiogram for further visualization. She denies any episodes of amaurosis fugax, sudden onset weakness or numbness of the extremities or slurred speech.  She underwent recent pacemaker placement in June 2018 for bradycardia. She is on Coumadin for atrial fibrillation. She is on dialysis via her left upper arm fistula on Tuesdays, Thursdays and Saturdays.   Has a past medical history of CAD status post CABG in 2005. Her cardiologist is Dr. Bronson Ing. Has history of hypertension, diabetes mellitus and congestive heart failure. She has never been a smoker.   Hospital Course:  The patient was admitted to the hospital and taken to the operating room on 12/18/2016 and underwent left carotid endarterectomy.  The patient tolerated the procedure well and was transported to the PACU in stable condition.  In the PACU, she developed an increasing hematoma of her left neck. She was not in any acute distress. She was taken to the OR for evacuation of hematoma left neck.   Operative findings: 3 x 5 cm hematoma left neck under pressure small amount of oozing adjacent to the vagus and ansa cervicalis, diffuse skin edge using no areas bleeding from carotid patch  POD 1: Neuro intact except for left marginal mandibular neuropraxia. She had 30 cc output from her JP drain. Her left neck had ecchymosis but  was without hematoma. JP drain discontinued. Warfarin restarted that evening but lovenox was held. Renal called for HD.  POD 2: No hematoma. Was tolerating diet without difficulty. Lovenox restarted. Was discharged home in good condition. [place] RN to check INR on Monday. She will follow anticoagulation schedule per outpatient recommendations. She knows to hold her anticoagulation and to call our office if she develops any bleeding or swelling with her left neck.     Recent Labs  12/18/16 0621 12/19/16 0425  NA 140 135  K 3.1* 4.2  CL  --  99*  CO2  --  28  GLUCOSE 65 157*  BUN  --  22*  CALCIUM  --  8.0*    Recent Labs  12/18/16 0621  HGB 10.9*  HCT 32.0*    Recent Labs  12/19/16 0934 12/20/16 0227  INR 1.35 1.34    Discharge Instructions:   The patient is discharged to home with extensive instructions on wound care and progressive ambulation.  They are instructed not to drive or perform any heavy lifting until returning to see the physician in his office.  Discharge Instructions    CAROTID [place]: Call MD for difficulty swallowing or speaking; weakness in arms or legs that is a new symtom; severe headache.  If you have increased swelling in the neck and/or  are having difficulty breathing, CALL 911    Complete by:  As directed    Call MD for:  redness, tenderness, or signs of infection (pain, swelling, bleeding, redness, odor or green/yellow discharge around incision site)  Complete by:  As directed    Call MD for:  severe or increased pain, loss or decreased feeling  in affected limb(s)    Complete by:  As directed    Call MD for:  temperature >100.5    Complete by:  As directed    Driving Restrictions    Complete by:  As directed    No driving for 1 week.   Increase activity slowly    Complete by:  As directed    Walk with assistance use walker or cane as needed   Lifting restrictions    Complete by:  As directed    No lifting greater than gallon of milk for 2  weeks   Resume previous diet    Complete by:  As directed       Discharge Diagnosis:  Left Internal Carotid Artery Stenosis I65.22 Hematoma  Secondary Diagnosis: Patient Active Problem List   Diagnosis Date Noted  . Asymptomatic stenosis of left carotid artery 12/18/2016  . Symptomatic bradycardia 11/05/2016  . 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 Paroxysmal atrial flutter ([place]) 10/15/2016  . Bradycardia 10/09/2016  . Personal history of noncompliance with medical treatment, presenting hazards to health 09/17/2016  . Essential hypertension   . Anemia of chronic kidney failure   . Chronic diastolic heart failure ([place]) 11/10/2015  . Depression   . Hypoalbuminemia due to protein-calorie malnutrition ([place])   . Debility 10/18/2015  . Abnormality of gait   . Long term current use of anticoagulant therapy   . Chronic pain syndrome   . Hx of CABG 2005   . Hx of non-ST elevation myocardial infarction (NSTEMI)   . Paroxysmal atrial fibrillation (HCC)   . Diabetes mellitus with end-stage renal disease ([place])   . ESRD (end stage renal disease) on dialysis ([place])   . AKI (acute kidney injury) ([place])   . Non-ST elevation myocardial infarction (NSTEMI) ([place]) 10/14/2015  . Pulmonary edema   . Abnormal nuclear stress test   . Acute renal failure with tubular necrosis ([place])   . Hypertensive emergency   . CAP (community acquired pneumonia)   . CAD in native artery   . Pulmonary hypertension ([place])   . Atrial fibrillation with RVR ([place])   . Acute on chronic renal failure ([place])   . Physical deconditioning   . Acute respiratory failure with hypoxia ([place])   . Acute pulmonary edema (HCC)   . Acute blood loss anemia   . Acute respiratory failure ([place]) 10/01/2015  . Hypoxia 10/01/2015  . Elevated troponin 10/01/2015  . Chest pain   . DVT (deep venous thrombosis) ([place])   . Hypoxemia   . Mixed hyperlipidemia 03/28/2015  . Vitamin D deficiency 03/28/2015  . Acute renal failure  ([place]) 03/04/2014  . UTI (lower urinary tract infection) 03/02/2014  . Acute encephalopathy 03/02/2014  . Dehydration 03/02/2014  . Generalized weakness 03/02/2014  . Obesity 03/02/2014  . Depressed mood 03/02/2014  . Hx of adenomatous colonic polyps 01/28/2012  . Occlusion and stenosis of carotid artery without mention of cerebral infarction 12/03/2011   Past Medical History:  Diagnosis Date  . Adenomatous colon polyp 09/2007  . Arthritis   . Atrial fibrillation with rapid ventricular response ([place]) 10/05/2015  . Bradycardia   . CAD (coronary artery disease)    a. s/p CABG 2005.  . Carotid artery disease ([place])    a. s/p R CEA 2011. Followed by vascular -  duplex 04/16/16: <81% RICA, 85-63% LICA.  Marland Kitchen Chronic anemia   .  Chronic diastolic CHF (congestive heart failure) ([place])   . Depression with anxiety   . Diabetes mellitus   . ESRD (end stage renal disease) ([place])    T Th Sat  . Heart murmur   . Hyperlipidemia   . Hypertension   . Neuropathy   . PAD (peripheral artery disease) ([place])   . Paroxysmal atrial flutter ([place])   . Peripheral vascular disease ([place])   . Pulmonary hypertension ([place])   . Rheumatic fever   . Sleep apnea     Allergies as of 12/20/2016      Reactions   Tape Other (See Comments)   TAPE WILL CAUSE BLISTERS/SORES; PLEASE USE COBAN WRAP!!   Gemfibrozil Other (See Comments)   Patient is not aware of this allergy but states that she had side effects to a cholesterol medication in the past   Oxycodone Other (See Comments)   Hallucinations   Carbamazepine Other (See Comments)   Reaction to tegretol - "loopy"   Clonazepam Other (See Comments)   Sleepy    Macrobid [nitrofurantoin] Nausea And Vomiting      Medication List    TAKE these medications   amLODipine 10 MG tablet Commonly known as:  NORVASC Take 10 mg by mouth daily.   aspirin 81 MG EC tablet Take 1 tablet (81 mg total) by mouth daily.   atorvastatin 80 MG tablet Commonly known as:   LIPITOR Take 1 tablet (80 mg total) by mouth daily.   blood glucose meter kit and supplies Kit Dispense based on patient and insurance preference. Use up to four times daily as directed. (FOR ICD-10 E11.65)   calcium acetate 667 MG capsule Commonly known as:  PHOSLO Take 2 capsules (1,334 mg total) by mouth 3 (three) times daily with meals.   cetirizine 10 MG tablet Commonly known as:  ZYRTEC Take 10 mg by mouth daily.   diazepam 5 MG tablet Commonly known as:  VALIUM Take 5 mg by mouth 3 (three) times daily as needed for anxiety.   enoxaparin 100 MG/ML injection Commonly known as:  LOVENOX Inject 1 mL (100 mg total) into the skin daily. At 6am   ferric citrate 1 GM 210 MG(Fe) tablet Commonly known as:  AURYXIA Take 210 mg by mouth 3 (three) times daily with meals.   HYDROcodone-acetaminophen 5-325 MG tablet Commonly known as:  NORCO/VICODIN Take 1 tablet by mouth every 6 (six) hours as needed for moderate pain (Must last 30 days.Do not take and drive a car or use machinery.).   Insulin Lispro Prot & Lispro (75-25) 100 UNIT/ML Kwikpen Commonly known as:  HUMALOG MIX 75/25 KWIKPEN Used 30 units with breakfast and 30 units with supper when pre-meal blood glucose is above 90 mg/dL.   nitroGLYCERIN 0.4 MG SL tablet Commonly known as:  NITROSTAT Place 1 tablet (0.4 mg total) under the tongue every 5 (five) minutes as needed for chest pain. Max three pills   polyethylene glycol packet Commonly known as:  MIRALAX / GLYCOLAX Take 17 g by mouth daily as needed for mild constipation.   protein supplement Powd Take 6 g by mouth 3 (three) times daily with meals.   rOPINIRole 0.25 MG tablet Commonly known as:  REQUIP Take 1 tablet (0.25 mg total) by mouth at bedtime.   sertraline 100 MG tablet Commonly known as:  ZOLOFT Take 200 mg by mouth daily.   Vitamin D (Ergocalciferol) 50000 units Caps capsule Commonly known as:  DRISDOL Take 50,000 Units by mouth every Thursday.  warfarin 5 MG tablet Commonly known as:  COUMADIN Take 7.5 mg by mouth daily on Monday and Thursday. Take 5 mg by mouth daily on all other days       Percocet #8 No Refill  Disposition: home  Patient's condition: is Good  Follow up: 1. Dr.  Donnetta Hutching in 2 weeks.   Virgina Jock, PA-C [facility] [phone]  --- For [facility] use --- Instructions: Press F2 to tab through selections.  Delete question if not applicable.   Modified Rankin score at D/C (0-6): 0  IV medication needed for:  1. Hypertension: No 2. Hypotension: No  Post-op Complications: Yes  1. Post-op CVA or TIA: No  2. CN injury: Yes  If yes: marginal mandibular nerve branch of CN VII  3. Myocardial infarction: No  4.  CHF: No  5.  Dysrhythmia (new): No  6. Wound infection: No  7. Reperfusion symptoms: No  8. Return to OR: Yes  If yes: return to OR for (bleeding, neurologic, other CEA incision, other): hematoma neck  Discharge medications: Statin use:  Yes If No: '[ ]'  For Medical reasons, '[ ]'  Non-compliant, '[ ]'  Not-indicated ASA use:  Yes  If No: '[ ]'  For Medical reasons, '[ ]'  Non-compliant, '[ ]'  Not-indicated Beta blocker use:  No If No: '[ ]'  For Medical reasons, '[ ]'  Non-compliant, [x ] Not-indicated ACE-Inhibitor use:  No If No: '[ ]'  For Medical reasons, '[ ]'  Non-compliant, [ x] Not-indicated P2Y12 Antagonist use: No, '[ ]'  Plavix, '[ ]'  Plasugrel, '[ ]'  Ticlopinine, '[ ]'  Ticagrelor, '[ ]'  Other, '[ ]'  No for medical reason, '[ ]'  Non-compliant, [x ] Not-indicated Anti-coagulant use:  Yes, [x ] Warfarin, '[ ]'  Rivaroxaban, '[ ]'  Dabigatran, '[ ]'  Other, '[ ]'  No for medical reason, '[ ]'  Non-compliant, '[ ]'  Not-indicated

## 2016-12-23 ENCOUNTER — Ambulatory Visit (INDEPENDENT_AMBULATORY_CARE_PROVIDER_SITE_OTHER): Payer: Medicare Other | Admitting: *Deleted

## 2016-12-23 LAB — POCT INR: INR: 1.6

## 2016-12-24 ENCOUNTER — Telehealth: Payer: Self-pay | Admitting: Cardiology

## 2016-12-24 ENCOUNTER — Ambulatory Visit: Payer: Medicare Other | Admitting: Orthopaedic Surgery

## 2016-12-24 NOTE — Telephone Encounter (Signed)
Office wt  On 12/11/16 was 216 lbs

## 2016-12-24 NOTE — Telephone Encounter (Signed)
Pt did not go to Dialysis today --she's having post op weakness. Her weight is up to 218 per [facility] w/ [place]

## 2016-12-25 ENCOUNTER — Ambulatory Visit (INDEPENDENT_AMBULATORY_CARE_PROVIDER_SITE_OTHER): Payer: Medicare Other | Admitting: *Deleted

## 2016-12-25 LAB — POCT INR: INR: 2.4

## 2016-12-25 NOTE — Telephone Encounter (Signed)
I will forward to Dr Koneswaran 

## 2016-12-25 NOTE — Telephone Encounter (Signed)
This is a Dr Raliegh Ip patient, please forward to him   Zandra Abts MD

## 2016-12-26 NOTE — Telephone Encounter (Signed)
Briana [name] at [facility] notified of Dr Court Joy message, pt had dialysis yesterday

## 2016-12-26 NOTE — Telephone Encounter (Signed)
She has ESRD and is on dialysis. Volume is managed per dialysis.

## 2016-12-30 ENCOUNTER — Ambulatory Visit (INDEPENDENT_AMBULATORY_CARE_PROVIDER_SITE_OTHER): Payer: Medicare Other | Admitting: *Deleted

## 2016-12-30 LAB — POCT INR: INR: 1.8

## 2016-12-31 ENCOUNTER — Encounter: Payer: Self-pay | Admitting: Vascular Surgery

## 2017-01-01 ENCOUNTER — Ambulatory Visit (INDEPENDENT_AMBULATORY_CARE_PROVIDER_SITE_OTHER): Payer: Medicare Other | Admitting: Orthopaedic Surgery

## 2017-01-01 ENCOUNTER — Ambulatory Visit (INDEPENDENT_AMBULATORY_CARE_PROVIDER_SITE_OTHER): Payer: Self-pay | Admitting: Vascular Surgery

## 2017-01-01 ENCOUNTER — Encounter: Payer: Self-pay | Admitting: Vascular Surgery

## 2017-01-01 ENCOUNTER — Encounter: Payer: Self-pay | Admitting: Orthopaedic Surgery

## 2017-01-01 VITALS — BP 126/60 | HR 81 | Temp 96.8°F | Ht 64.0 in | Wt 221.0 lb

## 2017-01-01 VITALS — BP 138/82 | HR 109 | Temp 98.2°F | Resp 18 | Ht 64.0 in | Wt 220.9 lb

## 2017-01-01 DIAGNOSIS — E1042 Type 1 diabetes mellitus with diabetic polyneuropathy: Secondary | ICD-10-CM | POA: Diagnosis not present

## 2017-01-01 DIAGNOSIS — I6522 Occlusion and stenosis of left carotid artery: Secondary | ICD-10-CM

## 2017-01-01 DIAGNOSIS — G8929 Other chronic pain: Secondary | ICD-10-CM

## 2017-01-01 DIAGNOSIS — I6523 Occlusion and stenosis of bilateral carotid arteries: Secondary | ICD-10-CM

## 2017-01-01 DIAGNOSIS — M25572 Pain in left ankle and joints of left foot: Secondary | ICD-10-CM

## 2017-01-01 NOTE — Progress Notes (Signed)
Patient TG:PQDIY Briana [name], [name] DOB:[birth year], 71 y.o. MEB:[id number]  Chief Complaint  Patient presents with  . Follow-up    left ankle    HPI  Briana [name] is a 71 y.o. [name] who has left ankle pain.  Since I saw her she has had carotid surgery and pacemaker insertion.  She is on dialysis also.  Her left foot hurts now and then but not as much as before.  She has swelling in both feet at times and the pain is worse.  She has no new trauma. HPI  Body mass index is 37.93 kg/m.  ROS  Review of Systems  HENT: Negative for congestion.   Respiratory: Negative for cough and shortness of breath.   Cardiovascular: Positive for leg swelling. Negative for chest pain.  Endocrine: Positive for cold intolerance.  Musculoskeletal: Positive for arthralgias, gait problem, joint swelling and myalgias.  Allergic/Immunologic: Positive for environmental allergies.    Past Medical History:  Diagnosis Date  . Adenomatous colon polyp 09/2007  . Arthritis   . Atrial fibrillation with rapid ventricular response ([place]) 10/05/2015  . Bradycardia   . CAD (coronary artery disease)    a. s/p CABG 2005.  . Carotid artery disease ([place])    a. s/p R CEA 2011. Followed by vascular -  duplex 04/16/16: <80% RICA, 88-11% LICA.  Marland Kitchen Chronic anemia   . Chronic diastolic CHF (congestive heart failure) ([place])   . Depression with anxiety   . Diabetes mellitus   . ESRD (end stage renal disease) ([place])    T Th Sat  . Heart murmur   . Hyperlipidemia   . Hypertension   . Neuropathy   . PAD (peripheral artery disease) ([place])   . Paroxysmal atrial flutter ([place])   . Peripheral vascular disease ([place])   . Pulmonary hypertension ([place])   . Rheumatic fever   . Sleep apnea     Past Surgical History:  Procedure Laterality Date  . BASCILIC VEIN TRANSPOSITION Left 12/04/2015   Procedure: BASILIC VEIN TRANSPOSITION;  Surgeon: Rosetta Posner, MD;  Location: [facility];  Service: Vascular;  Laterality: Left;  . CAROTID  ENDARTERECTOMY  10/04/2009   Right CEA  . CORONARY ARTERY BYPASS GRAFT  2005  . ENDARTERECTOMY Left 12/18/2016   Procedure: ENDARTERECTOMY CAROTID;  Surgeon: Rosetta Posner, MD;  Location: [facility];  Service: Vascular;  Laterality: Left;  . HEMATOMA EVACUATION Left 12/18/2016   Procedure: EVACUATION of left neck HEMATOMA;  Surgeon: Elam Dutch, MD;  Location: [facility];  Service: Vascular;  Laterality: Left;  . PACEMAKER IMPLANT N/A 11/06/2016   Procedure: Pacemaker Implant;  Surgeon: Evans Lance, MD;  Location: [facility];  Service: Cardiovascular;  Laterality: N/A;  . PATCH ANGIOPLASTY Left 12/18/2016   Procedure: PATCH ANGIOPLASTY WITH HEMASHIELD PLATIUM FINESSE PATCH;  Surgeon: Rosetta Posner, MD;  Location: [facility];  Service: Vascular;  Laterality: Left;  . PERIPHERAL VASCULAR CATHETERIZATION N/A 06/14/2016   Procedure: A/V Fistulagram - Left Upper;  Surgeon: Elam Dutch, MD;  Location: [facility];  Service: Cardiovascular;  Laterality: N/A;  . POLYPECTOMY  02/25/2012   Procedure: POLYPECTOMY;  Surgeon: Danie Binder, MD;  Location: [facility];  Service: Endoscopy;  Laterality: N/A;  . TUBAL LIGATION      Family History  Problem Relation Age of Onset  . Breast cancer Mother   . Heart disease Father   . Diabetes Father   . Heart disease Brother  Social History Social History  Substance Use Topics  . Smoking status: Never Smoker  . Smokeless tobacco: Never Used  . Alcohol use No    Allergies  Allergen Reactions  . Tape Other (See Comments)    TAPE WILL CAUSE BLISTERS/SORES; PLEASE USE COBAN WRAP!!  . Gemfibrozil Other (See Comments)    Patient is not aware of this allergy but states that she had side effects to a cholesterol medication in the past  . Oxycodone Other (See Comments)    Hallucinations   . Carbamazepine Other (See Comments)    Reaction to tegretol - "loopy"  . Clonazepam Other (See Comments)    Sleepy   . Macrobid [Nitrofurantoin] Nausea And  Vomiting    Current Outpatient Prescriptions  Medication Sig Dispense Refill  . amLODipine (NORVASC) 10 MG tablet Take 10 mg by mouth daily.    Marland Kitchen aspirin EC 81 MG EC tablet Take 1 tablet (81 mg total) by mouth daily. 30 tablet 1  . atorvastatin (LIPITOR) 80 MG tablet Take 1 tablet (80 mg total) by mouth daily. 30 tablet 3  . blood glucose meter kit and supplies KIT Dispense based on patient and insurance preference. Use up to four times daily as directed. (FOR ICD-10 E11.65) (Patient not taking: Reported on 12/16/2016) 1 each 0  . calcium acetate (PHOSLO) 667 MG capsule Take 2 capsules (1,334 mg total) by mouth 3 (three) times daily with meals. 180 capsule 1  . cetirizine (ZYRTEC) 10 MG tablet Take 10 mg by mouth daily.     . diazepam (VALIUM) 5 MG tablet Take 5 mg by mouth 3 (three) times daily as needed for anxiety.     . enoxaparin (LOVENOX) 100 MG/ML injection Inject 1 mL (100 mg total) into the skin daily. At 6am 10 Syringe 0  . ferric citrate (AURYXIA) 1 GM 210 MG(Fe) tablet Take 210 mg by mouth 3 (three) times daily with meals.     Marland Kitchen HYDROcodone-acetaminophen (NORCO/VICODIN) 5-325 MG tablet Take 1 tablet by mouth every 6 (six) hours as needed for moderate pain (Must last 30 days.Do not take and drive a car or use machinery.). 8 tablet 0  . Insulin Lispro Prot & Lispro (HUMALOG MIX 75/25 KWIKPEN) (75-25) 100 UNIT/ML Kwikpen Used 30 units with breakfast and 30 units with supper when pre-meal blood glucose is above 90 mg/dL. 10 pen 2  . nitroGLYCERIN (NITROSTAT) 0.4 MG SL tablet Place 1 tablet (0.4 mg total) under the tongue every 5 (five) minutes as needed for chest pain. Max three pills 45 tablet 0  . polyethylene glycol (MIRALAX / GLYCOLAX) packet Take 17 g by mouth daily as needed for mild constipation.    . protein supplement (RESOURCE BENEPROTEIN) POWD Take 6 g by mouth 3 (three) times daily with meals.  0  . rOPINIRole (REQUIP) 0.25 MG tablet Take 1 tablet (0.25 mg total) by mouth at  bedtime. 30 tablet 3  . sertraline (ZOLOFT) 100 MG tablet Take 200 mg by mouth daily.    . Vitamin D, Ergocalciferol, (DRISDOL) 50000 units CAPS capsule Take 50,000 Units by mouth every Thursday.     . warfarin (COUMADIN) 5 MG tablet Take 7.5 mg by mouth daily on Monday and Thursday. Take 5 mg by mouth daily on all other days 90 tablet 3   No current facility-administered medications for this visit.      Physical Exam  Blood pressure 126/60, pulse 81, temperature (!) 96.8 F (36 C), height '5\' 4"'  (1.626 m), weight  221 lb (100.2 kg).  Constitutional: overall normal hygiene, normal nutrition, well developed, normal grooming, normal body habitus. Assistive device:cane  Musculoskeletal: gait and station Limp left, muscle tone and strength are normal, no tremors or atrophy is present.  .  Neurological: coordination overall normal.  Deep tendon reflex/nerve stretch intact.  Sensation normal.  Cranial nerves II-XII intact.   Skin:   Normal overall no scars, lesions, ulcers or rashes. No psoriasis.  Psychiatric: Alert and oriented x 3.  Recent memory intact, remote memory unclear.  Normal mood and affect. Well groomed.  Good eye contact.  Cardiovascular: overall no swelling, no varicosities, no edema bilaterally, normal temperatures of the legs and arms, no clubbing, cyanosis and good capillary refill.  Lymphatic: palpation is normal.  Left foot and ankle with just very slight swelling.  NV intact. ROM is full.  The patient has been educated about the nature of the problem(s) and counseled on treatment options.  The patient appeared to understand what I have discussed and is in agreement with it.  Encounter Diagnoses  Name Primary?  . Spinal stenosis of lumbar region without neurogenic claudication Yes  . Diabetic peripheral neuropathy associated with type 1 diabetes mellitus ([place])     PLAN Call if any problems.  Precautions discussed.  Continue current medications.   Return to  clinic 3 months   Computers were down.  I gave Rx for Norco.  I have checked state site once computers up.  She got handwritten Rx.   I have reviewed the [place] web site prior to prescribing narcotic medicine for this patient.  Electronically Signed Sanjuana Kava, MD 8/8/20187:58 PM

## 2017-01-01 NOTE — Progress Notes (Signed)
Patient name: Briana [name] MRN: [id number] DOB: [birth year] Sex: female  REASON FOR VISIT: Follow-up left carotid endarterectomy for severe asymptomatic disease on 12/18/2016  HPI: Briana [name] is a 71 y.o. female here for follow-up. Underwent initially uneventful carotid endarterectomy. In the PACU she developed hematoma in her neck and was returned to the operating room for evacuation. She did well and was discharged home on postoperative day #2. She is here today with her daughter. Her daughter does report one episode of confusion prior to going to dialysis treatment. This has resolved over the past week.  Current Outpatient Prescriptions  Medication Sig Dispense Refill  . amLODipine (NORVASC) 10 MG tablet Take 10 mg by mouth daily.    Marland Kitchen aspirin EC 81 MG EC tablet Take 1 tablet (81 mg total) by mouth daily. 30 tablet 1  . atorvastatin (LIPITOR) 80 MG tablet Take 1 tablet (80 mg total) by mouth daily. 30 tablet 3  . calcium acetate (PHOSLO) 667 MG capsule Take 2 capsules (1,334 mg total) by mouth 3 (three) times daily with meals. 180 capsule 1  . cetirizine (ZYRTEC) 10 MG tablet Take 10 mg by mouth daily.     . diazepam (VALIUM) 5 MG tablet Take 5 mg by mouth 3 (three) times daily as needed for anxiety.     . enoxaparin (LOVENOX) 100 MG/ML injection Inject 1 mL (100 mg total) into the skin daily. At 6am 10 Syringe 0  . ferric citrate (AURYXIA) 1 GM 210 MG(Fe) tablet Take 210 mg by mouth 3 (three) times daily with meals.     Marland Kitchen HYDROcodone-acetaminophen (NORCO/VICODIN) 5-325 MG tablet Take 1 tablet by mouth every 6 (six) hours as needed for moderate pain (Must last 30 days.Do not take and drive a car or use machinery.). 8 tablet 0  . Insulin Lispro Prot & Lispro (HUMALOG MIX 75/25 KWIKPEN) (75-25) 100 UNIT/ML Kwikpen Used 30 units with breakfast and 30 units with supper when pre-meal blood glucose is above 90 mg/dL. 10 pen 2  . nitroGLYCERIN  (NITROSTAT) 0.4 MG SL tablet Place 1 tablet (0.4 mg total) under the tongue every 5 (five) minutes as needed for chest pain. Max three pills 45 tablet 0  . polyethylene glycol (MIRALAX / GLYCOLAX) packet Take 17 g by mouth daily as needed for mild constipation.    . protein supplement (RESOURCE BENEPROTEIN) POWD Take 6 g by mouth 3 (three) times daily with meals.  0  . rOPINIRole (REQUIP) 0.25 MG tablet Take 1 tablet (0.25 mg total) by mouth at bedtime. 30 tablet 3  . sertraline (ZOLOFT) 100 MG tablet Take 200 mg by mouth daily.    . Vitamin D, Ergocalciferol, (DRISDOL) 50000 units CAPS capsule Take 50,000 Units by mouth every Thursday.     . warfarin (COUMADIN) 5 MG tablet Take 7.5 mg by mouth daily on Monday and Thursday. Take 5 mg by mouth daily on all other days 90 tablet 3  . blood glucose meter kit and supplies KIT Dispense based on patient and insurance preference. Use up to four times daily as directed. (FOR ICD-10 E11.65) (Patient not taking: Reported on 12/16/2016) 1 each 0   No current facility-administered medications for this visit.      PHYSICAL EXAM: Vitals:   01/01/17 0952  BP: 138/82  Pulse: (!) 109  Resp: 18  Temp: 98.2 F (36.8 C)  TempSrc: Oral  SpO2: 93%  Weight: 220 lb 14.4 oz (100.2 kg)  Height: '5\' 4"'  (1.626 m)  GENERAL: The patient is a well-nourished female, in no acute distress. The vital signs are documented above. Her left neck incision is healed quite nicely. She does have resolving bruising on her chest. She does have the typical. Incisional numbness  MEDICAL ISSUES: Stable overall. We'll continue usual activities. We will see her again in 6 months with carotid duplex. She'll notify should she develop any wound issues or neurologic changes.   Briana Posner, MD FACS [facility] Tel [phone] Pager [phone]

## 2017-01-08 ENCOUNTER — Ambulatory Visit (INDEPENDENT_AMBULATORY_CARE_PROVIDER_SITE_OTHER): Payer: Medicare Other | Admitting: *Deleted

## 2017-01-08 LAB — POCT INR: INR: 1.3

## 2017-01-09 ENCOUNTER — Telehealth: Payer: Self-pay

## 2017-01-09 NOTE — Telephone Encounter (Signed)
[name]) called to inform Dr. Bronson Ing that the pt's. HR was 100 with no complaints. Will forward as an FYI:

## 2017-01-10 ENCOUNTER — Ambulatory Visit: Payer: Medicare Other | Admitting: Cardiovascular Disease

## 2017-01-10 NOTE — Addendum Note (Signed)
Addended by: Lianne Cure A on: 01/10/2017 01:29 PM   Modules accepted: Orders

## 2017-01-14 ENCOUNTER — Encounter: Payer: Self-pay | Admitting: Orthopaedic Surgery

## 2017-01-14 ENCOUNTER — Encounter: Payer: Medicare Other | Admitting: Vascular Surgery

## 2017-01-14 ENCOUNTER — Ambulatory Visit (INDEPENDENT_AMBULATORY_CARE_PROVIDER_SITE_OTHER): Payer: Medicare Other | Admitting: Orthopaedic Surgery

## 2017-01-14 VITALS — BP 142/63 | HR 85 | Temp 99.5°F | Ht 64.0 in | Wt 219.0 lb

## 2017-01-14 DIAGNOSIS — G8929 Other chronic pain: Secondary | ICD-10-CM | POA: Diagnosis not present

## 2017-01-14 DIAGNOSIS — M25512 Pain in left shoulder: Secondary | ICD-10-CM | POA: Diagnosis not present

## 2017-01-14 NOTE — Progress Notes (Signed)
PROCEDURE NOTE:  The patient request injection, verbal consent was obtained.  The left shoulder was prepped appropriately after time out was performed.   Sterile technique was observed and injection of 1 cc of Depo-Medrol 40 mg with several cc's of plain xylocaine. Anesthesia was provided by ethyl chloride and a 20-gauge needle was used to inject the shoulder area. A posterior approach was used.  The injection was tolerated well.  A band aid dressing was applied.  The patient was advised to apply ice later today and tomorrow to the injection sight as needed.  Encounter Diagnosis  Name Primary?  . Chronic left shoulder pain Yes   Return in one month.  Call if any problem.  Precautions discussed.   Electronically Signed Sanjuana Kava, MD 8/21/20181:38 PM

## 2017-01-15 ENCOUNTER — Telehealth: Payer: Self-pay | Admitting: Internal Medicine

## 2017-01-15 ENCOUNTER — Ambulatory Visit (INDEPENDENT_AMBULATORY_CARE_PROVIDER_SITE_OTHER): Payer: Medicare Other | Admitting: *Deleted

## 2017-01-15 LAB — POCT INR: INR: 2.2

## 2017-01-15 NOTE — Telephone Encounter (Signed)
Herminio Commons, MD  to Drema Dallas, CMA     9:38 AM  Note    She has ESRD and is on dialysis. Volume is managed per dialysis.     December 25, 2016         I have copied above note that dialysis manages patient volume overload.I will tell [facility] this too. I left message with [name] [facility] advising her to speak with dialysis    Will FYI Dr.Koneswaran

## 2017-01-15 NOTE — Telephone Encounter (Signed)
Pt's weight today is 218lb per [name]  [phone] w/ [facility]-- no complaints, a little more swollen, discussed diet

## 2017-01-22 ENCOUNTER — Ambulatory Visit (INDEPENDENT_AMBULATORY_CARE_PROVIDER_SITE_OTHER): Payer: Medicare Other | Admitting: Cardiology

## 2017-01-22 ENCOUNTER — Telehealth: Payer: Self-pay | Admitting: *Deleted

## 2017-01-22 LAB — POCT INR: INR: 2.4

## 2017-01-22 NOTE — Telephone Encounter (Signed)
Returned call to Cole, [facility] RN. Please refer to Anticoagulation Encounter for dosing instructions.

## 2017-01-22 NOTE — Telephone Encounter (Signed)
INR 2.4 / please call with instructions / tg  °

## 2017-01-29 ENCOUNTER — Ambulatory Visit (INDEPENDENT_AMBULATORY_CARE_PROVIDER_SITE_OTHER): Payer: Medicare Other | Admitting: *Deleted

## 2017-01-29 DIAGNOSIS — I4891 Unspecified atrial fibrillation: Secondary | ICD-10-CM | POA: Diagnosis not present

## 2017-01-29 DIAGNOSIS — Z5181 Encounter for therapeutic drug level monitoring: Secondary | ICD-10-CM | POA: Diagnosis not present

## 2017-01-29 LAB — POCT INR: INR: 2.1

## 2017-02-04 ENCOUNTER — Encounter: Payer: Self-pay | Admitting: Gastroenterology

## 2017-02-05 ENCOUNTER — Telehealth: Payer: Self-pay | Admitting: *Deleted

## 2017-02-05 ENCOUNTER — Ambulatory Visit (INDEPENDENT_AMBULATORY_CARE_PROVIDER_SITE_OTHER): Payer: Medicare Other | Admitting: Interventional Cardiology

## 2017-02-05 DIAGNOSIS — I4891 Unspecified atrial fibrillation: Secondary | ICD-10-CM | POA: Diagnosis not present

## 2017-02-05 DIAGNOSIS — Z5181 Encounter for therapeutic drug level monitoring: Secondary | ICD-10-CM | POA: Diagnosis not present

## 2017-02-05 LAB — POCT INR: INR: 1.5

## 2017-02-05 NOTE — Telephone Encounter (Signed)
INR 1.5  Per Christie Beckers nurse w/ [facility] [phone]

## 2017-02-05 NOTE — Telephone Encounter (Signed)
Spoke with pt & pt's dtr; please refer to Anticoagulation Encounter for instructions.

## 2017-02-12 ENCOUNTER — Telehealth: Payer: Self-pay | Admitting: Cardiovascular Disease

## 2017-02-12 ENCOUNTER — Ambulatory Visit (INDEPENDENT_AMBULATORY_CARE_PROVIDER_SITE_OTHER): Payer: Medicare Other | Admitting: *Deleted

## 2017-02-12 DIAGNOSIS — Z5181 Encounter for therapeutic drug level monitoring: Secondary | ICD-10-CM | POA: Diagnosis not present

## 2017-02-12 DIAGNOSIS — I4891 Unspecified atrial fibrillation: Secondary | ICD-10-CM | POA: Diagnosis not present

## 2017-02-12 LAB — POCT INR: INR: 1.6

## 2017-02-12 NOTE — Telephone Encounter (Signed)
That would be fine 

## 2017-02-12 NOTE — Telephone Encounter (Signed)
Heart rate 120 regular--[name] is needing a verbal order to re certifie the pt for a 9 wk period. Please give Briana [name] a call @ [phone]

## 2017-02-12 NOTE — Telephone Encounter (Signed)
Will forward to Dr. Bronson Ing for approval.

## 2017-02-12 NOTE — Telephone Encounter (Signed)
[name] notified

## 2017-02-13 DIAGNOSIS — I2510 Atherosclerotic heart disease of native coronary artery without angina pectoris: Secondary | ICD-10-CM

## 2017-02-14 ENCOUNTER — Encounter: Payer: Self-pay | Admitting: Internal Medicine

## 2017-02-14 ENCOUNTER — Ambulatory Visit (INDEPENDENT_AMBULATORY_CARE_PROVIDER_SITE_OTHER): Payer: Medicare Other | Admitting: Internal Medicine

## 2017-02-14 VITALS — BP 128/78 | HR 118 | Ht 64.0 in | Wt 217.0 lb

## 2017-02-14 DIAGNOSIS — I4891 Unspecified atrial fibrillation: Secondary | ICD-10-CM

## 2017-02-14 DIAGNOSIS — I482 Chronic atrial fibrillation, unspecified: Secondary | ICD-10-CM

## 2017-02-14 DIAGNOSIS — I6523 Occlusion and stenosis of bilateral carotid arteries: Secondary | ICD-10-CM

## 2017-02-14 DIAGNOSIS — I495 Sick sinus syndrome: Secondary | ICD-10-CM

## 2017-02-14 LAB — CUP PACEART INCLINIC DEVICE CHECK
Brady Statistic AP VP Percent: 0.04 %
Brady Statistic AP VS Percent: 44.35 %
Brady Statistic AS VS Percent: 55.61 %
Brady Statistic RA Percent Paced: 43.38 %
Brady Statistic RV Percent Paced: 0.04 %
Implantable Lead Implant Date: 20180613
Implantable Lead Location: 753859
Implantable Lead Location: 753860
Implantable Lead Model: 3830
Implantable Pulse Generator Implant Date: 20180613
Lead Channel Impedance Value: 418 Ohm
Lead Channel Impedance Value: 437 Ohm
Lead Channel Impedance Value: 703 Ohm
Lead Channel Pacing Threshold Amplitude: 1 V
Lead Channel Pacing Threshold Pulse Width: 1 ms
Lead Channel Sensing Intrinsic Amplitude: 14.75 mV
Lead Channel Setting Pacing Amplitude: 2.5 V
Lead Channel Setting Pacing Amplitude: 2.5 V
Lead Channel Setting Pacing Pulse Width: 0.5 ms
Lead Channel Setting Sensing Sensitivity: 2.8 mV
MDC IDC LEAD IMPLANT DT: 20180613
MDC IDC MSMT BATTERY REMAINING LONGEVITY: 137 mo
MDC IDC MSMT BATTERY VOLTAGE: 3.16 V
MDC IDC MSMT LEADCHNL RA IMPEDANCE VALUE: 361 Ohm
MDC IDC MSMT LEADCHNL RA PACING THRESHOLD AMPLITUDE: 0.75 V
MDC IDC MSMT LEADCHNL RA SENSING INTR AMPL: 3.625 mV
MDC IDC MSMT LEADCHNL RA SENSING INTR AMPL: 6.25 mV
MDC IDC MSMT LEADCHNL RV PACING THRESHOLD PULSEWIDTH: 0.5 ms
MDC IDC MSMT LEADCHNL RV SENSING INTR AMPL: 15.625 mV
MDC IDC SESS DTM: 20180921132157
MDC IDC STAT BRADY AS VP PERCENT: 0 %

## 2017-02-14 MED ORDER — METOPROLOL TARTRATE 25 MG PO TABS: 25.0000 mg | ORAL_TABLET | Freq: Two times a day (BID) | ORAL | 3 refills | Status: DC

## 2017-02-14 NOTE — Patient Instructions (Signed)
Medication Instructions:  Your physician has recommended you make the following change in your medication:  Start Lopressor 25 mg Two Times Daily    Labwork: NONE   Testing/Procedures: NONE   Follow-Up: Your physician recommends that you schedule a follow-up appointment in: 2 Weeks for EKG.  Your physician wants you to follow-up in: 9 Months with Dr. Lovena Le. You will receive a reminder letter in the mail two months in advance. If you don't receive a letter, please call our office to schedule the follow-up appointment.  Remote monitoring is used to monitor your Pacemaker of ICD from home. This monitoring reduces the number of office visits required to check your device to one time per year. It allows [place] to keep an eye on the functioning of your device to ensure it is working properly. You are scheduled for a device check from home on 05/19/17. You may send your transmission at any time that day. If you have a wireless device, the transmission will be sent automatically. After your physician reviews your transmission, you will receive a postcard with your next transmission date.    Any Other Special Instructions Will Be Listed Below (If Applicable).     If you need a refill on your cardiac medications before your next appointment, please call your pharmacy.  Thank you for choosing [facility]!

## 2017-02-14 NOTE — Progress Notes (Signed)
HPI Mrs. Seufert returns today for ongoing evaluation and management of atrial fibrillation, atrial flutter, and complete heart block status post pacemaker insertion. The patient is now just over 3 months out from her initial pacemaker implant for complete heart block. She has had her beta blockers discontinued and has now developed rapid atrial flutter. In the interim she has undergone carotid endarterectomy complicated by hematoma requiring surgical evacuation. Despite her ventricular rate being increased, she denies palpitations and does not appreciate a difference in her dyspnea which is stable class II. She denies peripheral edema. She dialyzes 3 times a week and has had no chest pain or shortness of breath. Allergies  Allergen Reactions  . Tape Other (See Comments)    TAPE WILL CAUSE BLISTERS/SORES; PLEASE USE COBAN WRAP!!  . Gemfibrozil Other (See Comments)    Patient is not aware of this allergy but states that she had side effects to a cholesterol medication in the past  . Oxycodone Other (See Comments)    Hallucinations   . Carbamazepine Other (See Comments)    Reaction to tegretol - "loopy"  . Clonazepam Other (See Comments)    Sleepy   . Macrobid [Nitrofurantoin] Nausea And Vomiting     Current Outpatient Prescriptions  Medication Sig Dispense Refill  . amLODipine (NORVASC) 10 MG tablet Take 10 mg by mouth daily.    Marland Kitchen aspirin EC 81 MG EC tablet Take 1 tablet (81 mg total) by mouth daily. 30 tablet 1  . atorvastatin (LIPITOR) 80 MG tablet Take 1 tablet (80 mg total) by mouth daily. 30 tablet 3  . blood glucose meter kit and supplies KIT Dispense based on patient and insurance preference. Use up to four times daily as directed. (FOR ICD-10 E11.65) 1 each 0  . calcium acetate (PHOSLO) 667 MG capsule Take 2 capsules (1,334 mg total) by mouth 3 (three) times daily with meals. 180 capsule 1  . cetirizine (ZYRTEC) 10 MG tablet Take 10 mg by mouth daily.     . diazepam  (VALIUM) 5 MG tablet Take 5 mg by mouth 3 (three) times daily as needed for anxiety.     . enoxaparin (LOVENOX) 100 MG/ML injection Inject 1 mL (100 mg total) into the skin daily. At 6am 10 Syringe 0  . ferric citrate (AURYXIA) 1 GM 210 MG(Fe) tablet Take 210 mg by mouth 3 (three) times daily with meals.     Marland Kitchen HYDROcodone-acetaminophen (NORCO/VICODIN) 5-325 MG tablet Take 1 tablet by mouth every 6 (six) hours as needed for moderate pain (Must last 30 days.Do not take and drive a car or use machinery.). 8 tablet 0  . Insulin Lispro Prot & Lispro (HUMALOG MIX 75/25 KWIKPEN) (75-25) 100 UNIT/ML Kwikpen Used 30 units with breakfast and 30 units with supper when pre-meal blood glucose is above 90 mg/dL. 10 pen 2  . nitroGLYCERIN (NITROSTAT) 0.4 MG SL tablet Place 1 tablet (0.4 mg total) under the tongue every 5 (five) minutes as needed for chest pain. Max three pills 45 tablet 0  . polyethylene glycol (MIRALAX / GLYCOLAX) packet Take 17 g by mouth daily as needed for mild constipation.    . protein supplement (RESOURCE BENEPROTEIN) POWD Take 6 g by mouth 3 (three) times daily with meals.  0  . rOPINIRole (REQUIP) 0.25 MG tablet Take 1 tablet (0.25 mg total) by mouth at bedtime. 30 tablet 3  . sertraline (ZOLOFT) 100 MG tablet Take 200 mg by mouth daily.    Marland Kitchen  Vitamin D, Ergocalciferol, (DRISDOL) 50000 units CAPS capsule Take 50,000 Units by mouth every Thursday.     . warfarin (COUMADIN) 5 MG tablet Take 7.5 mg by mouth daily on Monday and Thursday. Take 5 mg by mouth daily on all other days 90 tablet 3   No current facility-administered medications for this visit.      Past Medical History:  Diagnosis Date  . Adenomatous colon polyp 09/2007  . Arthritis   . Atrial fibrillation with rapid ventricular response ([place]) 10/05/2015  . Bradycardia   . CAD (coronary artery disease)    a. s/p CABG 2005.  . Carotid artery disease ([place])    a. s/p R CEA 2011. Followed by vascular -  duplex 04/16/16: <41%  RICA, 32-44% LICA.  Marland Kitchen Chronic anemia   . Chronic diastolic CHF (congestive heart failure) ([place])   . Depression with anxiety   . Diabetes mellitus   . ESRD (end stage renal disease) ([place])    T Th Sat  . Heart murmur   . Hyperlipidemia   . Hypertension   . Neuropathy   . PAD (peripheral artery disease) ([place])   . Paroxysmal atrial flutter ([place])   . Peripheral vascular disease ([place])   . Pulmonary hypertension ([place])   . Rheumatic fever   . Sleep apnea     ROS:   All systems reviewed and negative except as noted in the HPI.   Past Surgical History:  Procedure Laterality Date  . BASCILIC VEIN TRANSPOSITION Left 12/04/2015   Procedure: BASILIC VEIN TRANSPOSITION;  Surgeon: Rosetta Posner, MD;  Location: [facility];  Service: Vascular;  Laterality: Left;  . CAROTID ENDARTERECTOMY  10/04/2009   Right CEA  . CORONARY ARTERY BYPASS GRAFT  2005  . ENDARTERECTOMY Left 12/18/2016   Procedure: ENDARTERECTOMY CAROTID;  Surgeon: Rosetta Posner, MD;  Location: [facility];  Service: Vascular;  Laterality: Left;  . HEMATOMA EVACUATION Left 12/18/2016   Procedure: EVACUATION of left neck HEMATOMA;  Surgeon: Elam Dutch, MD;  Location: [facility];  Service: Vascular;  Laterality: Left;  . PACEMAKER IMPLANT N/A 11/06/2016   Procedure: Pacemaker Implant;  Surgeon: Evans Lance, MD;  Location: [facility];  Service: Cardiovascular;  Laterality: N/A;  . PATCH ANGIOPLASTY Left 12/18/2016   Procedure: PATCH ANGIOPLASTY WITH HEMASHIELD PLATIUM FINESSE PATCH;  Surgeon: Rosetta Posner, MD;  Location: [facility];  Service: Vascular;  Laterality: Left;  . PERIPHERAL VASCULAR CATHETERIZATION N/A 06/14/2016   Procedure: A/V Fistulagram - Left Upper;  Surgeon: Elam Dutch, MD;  Location: [facility];  Service: Cardiovascular;  Laterality: N/A;  . POLYPECTOMY  02/25/2012   Procedure: POLYPECTOMY;  Surgeon: Danie Binder, MD;  Location: [facility];  Service: Endoscopy;  Laterality: N/A;  . TUBAL LIGATION        Family History  Problem Relation Age of Onset  . Breast cancer Mother   . Heart disease Father   . Diabetes Father   . Heart disease Brother      Social History   Social History  . Marital status: Married    Spouse name: N/A  . Number of children: N/A  . Years of education: N/A   Occupational History  . Not on file.   Social History Main Topics  . Smoking status: Never Smoker  . Smokeless tobacco: Never Used  . Alcohol use No  . Drug use: No  . Sexual activity: Not on file   Other Topics Concern  .  Not on file   Social History Narrative  . No narrative on file     BP 128/78   Pulse (!) 118   Ht '5\' 4"'  (1.626 m)   Wt 217 lb (98.4 kg)   SpO2 94%   BMI 37.25 kg/m   Physical Exam:  Chronically ill appearing 71 year old woman, NAD HEENT: Unremarkable Neck:  6 cm JVD, no thyromegally Lymphatics:  No adenopathy Back:  No CVA tenderness Lungs:  Clear except for basilar rales. No increased work of breathing. No wheezes or rhonchi. HEART:  Regular tachycardic rhythm, no murmurs, no rubs, no clicks Abd:  soft, positive bowel sounds, no organomegally, no rebound, no guarding Ext:  2 plus pulses, trace peripheral edema, no cyanosis, no clubbing, left upper arm with fistula in place Skin:  No rashes no nodules Neuro:  CN II through XII intact, motor grossly intact  EKG - atrial flutter with 2:1 AV conduction and right bundle branch block  DEVICE  Normal device function.  See PaceArt for details.   Assess/Plan: 1. Atrial fibrillation and flutter - previously her ventricular rates were very slow and she is now going fast. I've asked the patient to reinitiate beta blocker therapy with metoprolol 25 mg twice daily. We will plan to uptitrate her dose of metoprolol as indicated for rate control. 2. Complete heart block - she is status post pacemaker insertion and has had no syncope. 3. Permanent pacemaker - she is status post Medtronic dual-chamber pacemaker with a  lead placed in the his bundle region. She has mostly septal capture. 4. Chronic diastolic heart failure - her symptoms remain class II. She is encouraged to avoid salty foods. She is undergoing hemodialysis 3 times a week which markedly improves her volume status.  [name], M.D.

## 2017-02-15 IMAGING — CR DG CHEST 1V PORT
1 series · 1 of 1 positions shown · non-contrast
Comparison: 10/05/2015.

CLINICAL DATA: Central line placement.

EXAM:
PORTABLE CHEST 1 VIEW

[AP]
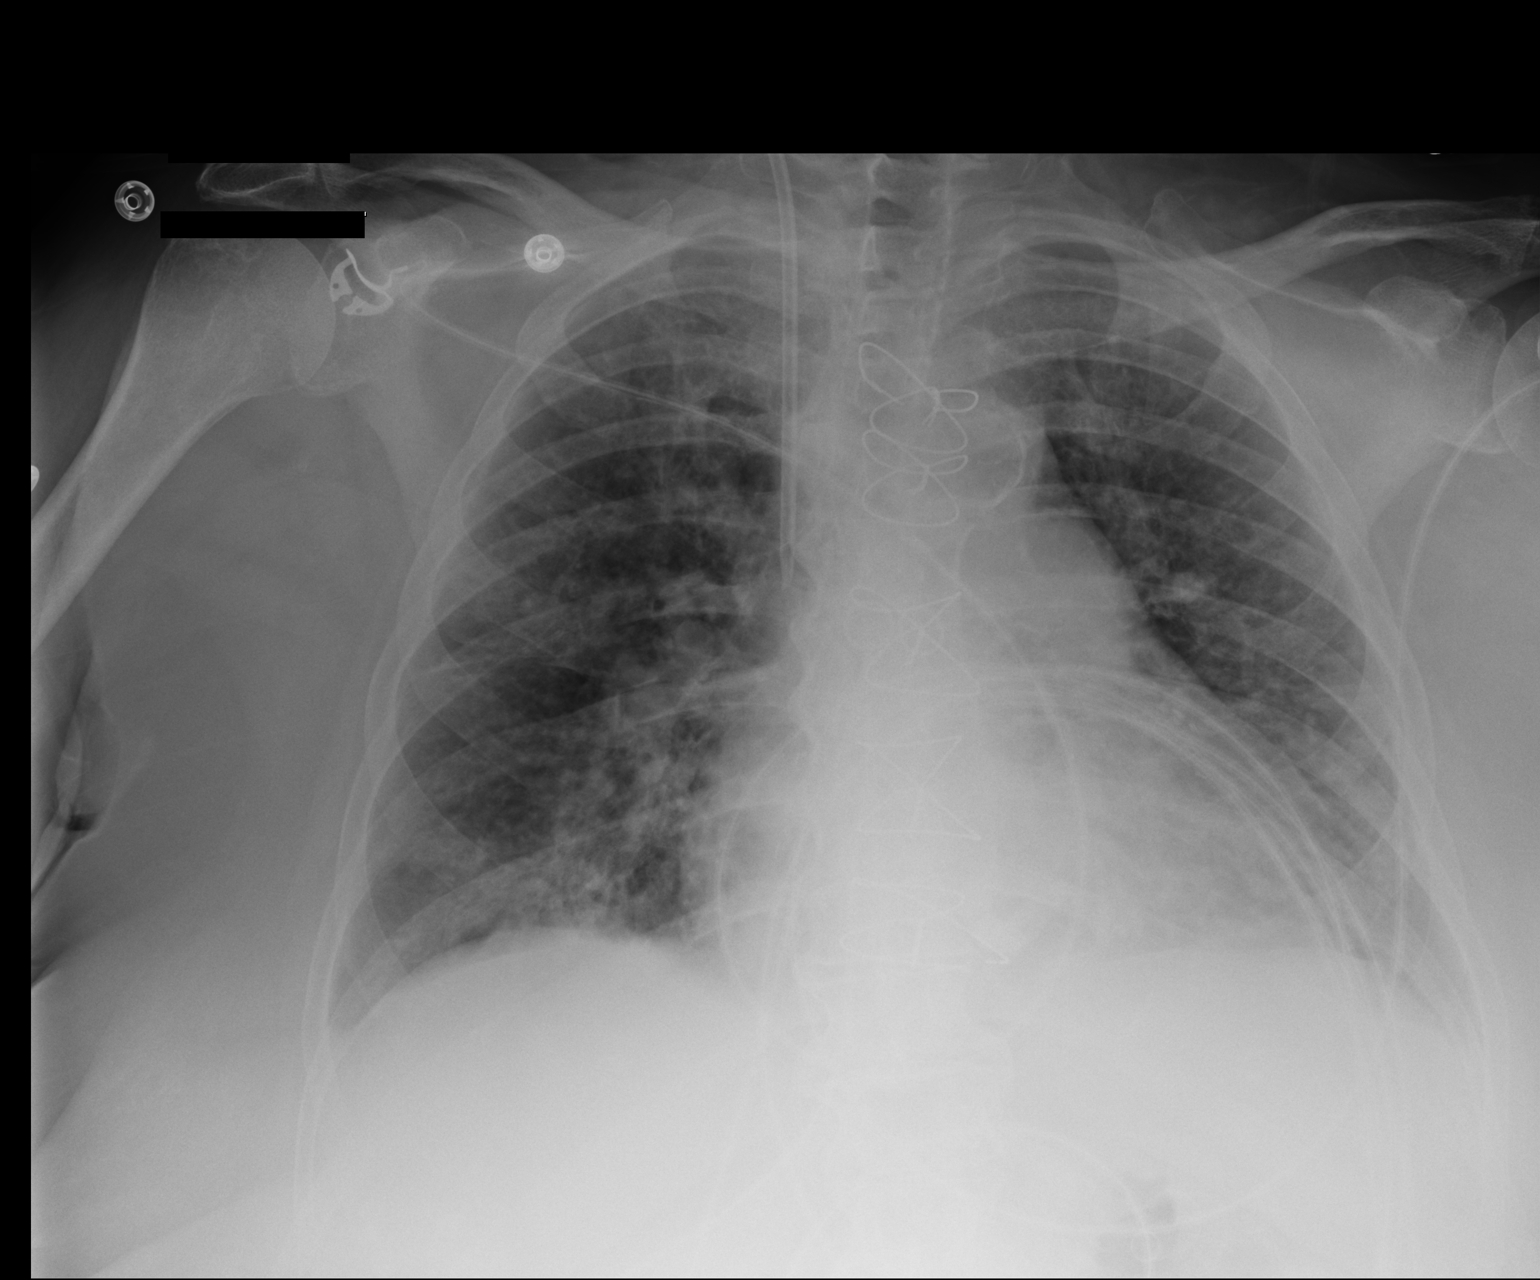

[1 of 1 positions shown; findings below may reference images not displayed]

FINDINGS: 6565 hours. Right IJ central venous catheter has been placed,
extending to the mid SVC level. The heart size and mediastinal
contours are stable status post CABG. Pulmonary edema appears mildly
improved. No pneumothorax or significant pleural effusion
identified.
IMPRESSION: Central line extends to the mid SVC level. No evidence of
pneumothorax. Mild improvement in pulmonary edema.

## 2017-02-18 ENCOUNTER — Ambulatory Visit (INDEPENDENT_AMBULATORY_CARE_PROVIDER_SITE_OTHER): Payer: Medicare Other | Admitting: Orthopaedic Surgery

## 2017-02-18 ENCOUNTER — Encounter: Payer: Self-pay | Admitting: Orthopaedic Surgery

## 2017-02-18 VITALS — BP 148/72 | HR 81 | Temp 98.3°F | Ht 64.0 in | Wt 213.0 lb

## 2017-02-18 DIAGNOSIS — I6523 Occlusion and stenosis of bilateral carotid arteries: Secondary | ICD-10-CM | POA: Diagnosis not present

## 2017-02-18 DIAGNOSIS — G8929 Other chronic pain: Secondary | ICD-10-CM

## 2017-02-18 DIAGNOSIS — M25512 Pain in left shoulder: Secondary | ICD-10-CM | POA: Diagnosis not present

## 2017-02-18 DIAGNOSIS — M25572 Pain in left ankle and joints of left foot: Secondary | ICD-10-CM

## 2017-02-18 MED ORDER — HYDROCODONE-ACETAMINOPHEN 5-325 MG PO TABS: 1.0000 | ORAL_TABLET | Freq: Four times a day (QID) | ORAL | 0 refills | Status: DC | PRN

## 2017-02-18 NOTE — Progress Notes (Signed)
Patient UU:VOZDG Briana [name], female DOB:04/24/1946, 71 y.o. UYQ:[id number]  Chief Complaint  Patient presents with  . Follow-up    left shoulder    HPI  Briana [name] is a 71 y.o. female who has left shoulder pain.  She is much improved.  She has less pain, less problems.  She is moving it well.  The injection last time really helped.   HPI  Body mass index is 36.56 kg/m.  ROS  Review of Systems  HENT: Negative for congestion.   Respiratory: Negative for cough and shortness of breath.   Cardiovascular: Positive for leg swelling. Negative for chest pain.  Endocrine: Positive for cold intolerance.  Musculoskeletal: Positive for arthralgias, gait problem, joint swelling and myalgias.  Allergic/Immunologic: Positive for environmental allergies.    Past Medical History:  Diagnosis Date  . Adenomatous colon polyp 09/2007  . Arthritis   . Atrial fibrillation with rapid ventricular response ([place]) 10/05/2015  . Bradycardia   . CAD (coronary artery disease)    a. s/p CABG 2005.  . Carotid artery disease ([place])    a. s/p R CEA 2011. Followed by vascular -  duplex 04/16/16: <63% RICA, 87-56% LICA.  Marland Kitchen Chronic anemia   . Chronic diastolic CHF (congestive heart failure) ([place])   . Depression with anxiety   . Diabetes mellitus   . ESRD (end stage renal disease) ([place])    T Th Sat  . Heart murmur   . Hyperlipidemia   . Hypertension   . Neuropathy   . PAD (peripheral artery disease) ([place])   . Paroxysmal atrial flutter ([place])   . Peripheral vascular disease ([place])   . Pulmonary hypertension ([place])   . Rheumatic fever   . Sleep apnea     Past Surgical History:  Procedure Laterality Date  . BASCILIC VEIN TRANSPOSITION Left 12/04/2015   Procedure: BASILIC VEIN TRANSPOSITION;  Surgeon: Rosetta Posner, MD;  Location: [facility];  Service: Vascular;  Laterality: Left;  . CAROTID ENDARTERECTOMY  10/04/2009   Right CEA  . CORONARY ARTERY BYPASS GRAFT  2005  . ENDARTERECTOMY Left 12/18/2016   Procedure: ENDARTERECTOMY CAROTID;  Surgeon: Rosetta Posner, MD;  Location: [facility];  Service: Vascular;  Laterality: Left;  . HEMATOMA EVACUATION Left 12/18/2016   Procedure: EVACUATION of left neck HEMATOMA;  Surgeon: Elam Dutch, MD;  Location: [facility];  Service: Vascular;  Laterality: Left;  . PACEMAKER IMPLANT N/A 11/06/2016   Procedure: Pacemaker Implant;  Surgeon: Evans Lance, MD;  Location: [facility];  Service: Cardiovascular;  Laterality: N/A;  . PATCH ANGIOPLASTY Left 12/18/2016   Procedure: PATCH ANGIOPLASTY WITH HEMASHIELD PLATIUM FINESSE PATCH;  Surgeon: Rosetta Posner, MD;  Location: [facility];  Service: Vascular;  Laterality: Left;  . PERIPHERAL VASCULAR CATHETERIZATION N/A 06/14/2016   Procedure: A/V Fistulagram - Left Upper;  Surgeon: Elam Dutch, MD;  Location: [facility];  Service: Cardiovascular;  Laterality: N/A;  . POLYPECTOMY  02/25/2012   Procedure: POLYPECTOMY;  Surgeon: Danie Binder, MD;  [facility]: [facility];  Service: Endoscopy;  Laterality: N/A;  . TUBAL LIGATION      Family History  Problem Relation Age of Onset  . Breast cancer Mother   . Heart disease Father   . Diabetes Father   . Heart disease Brother     Social History Social History  Substance Use Topics  . Smoking status: Never Smoker  . Smokeless tobacco: Never Used  . Alcohol use No  Allergies  Allergen Reactions  . Tape Other (See Comments)    TAPE WILL CAUSE BLISTERS/SORES; PLEASE USE COBAN WRAP!!  . Gemfibrozil Other (See Comments)    Patient is not aware of this allergy but states that she had side effects to a cholesterol medication in the past  . Oxycodone Other (See Comments)    Hallucinations   . Carbamazepine Other (See Comments)    Reaction to tegretol - "loopy"  . Clonazepam Other (See Comments)    Sleepy   . Macrobid [Nitrofurantoin] Nausea And Vomiting    Current Outpatient Prescriptions  Medication Sig Dispense Refill  . amLODipine (NORVASC) 10 MG  tablet Take 10 mg by mouth daily.    Marland Kitchen aspirin EC 81 MG EC tablet Take 1 tablet (81 mg total) by mouth daily. 30 tablet 1  . atorvastatin (LIPITOR) 80 MG tablet Take 1 tablet (80 mg total) by mouth daily. 30 tablet 3  . blood glucose meter kit and supplies KIT Dispense based on patient and insurance preference. Use up to four times daily as directed. (FOR ICD-10 E11.65) 1 each 0  . calcium acetate (PHOSLO) 667 MG capsule Take 2 capsules (1,334 mg total) by mouth 3 (three) times daily with meals. 180 capsule 1  . cetirizine (ZYRTEC) 10 MG tablet Take 10 mg by mouth daily.     . diazepam (VALIUM) 5 MG tablet Take 5 mg by mouth 3 (three) times daily as needed for anxiety.     . enoxaparin (LOVENOX) 100 MG/ML injection Inject 1 mL (100 mg total) into the skin daily. At 6am 10 Syringe 0  . ferric citrate (AURYXIA) 1 GM 210 MG(Fe) tablet Take 210 mg by mouth 3 (three) times daily with meals.     Marland Kitchen HYDROcodone-acetaminophen (NORCO/VICODIN) 5-325 MG tablet Take 1 tablet by mouth every 6 (six) hours as needed for moderate pain (Must last 14 days.Do not take and drive a car or use machinery.). 56 tablet 0  . Insulin Lispro Prot & Lispro (HUMALOG MIX 75/25 KWIKPEN) (75-25) 100 UNIT/ML Kwikpen Used 30 units with breakfast and 30 units with supper when pre-meal blood glucose is above 90 mg/dL. 10 pen 2  . metoprolol tartrate (LOPRESSOR) 25 MG tablet Take 1 tablet (25 mg total) by mouth 2 (two) times daily. 180 tablet 3  . nitroGLYCERIN (NITROSTAT) 0.4 MG SL tablet Place 1 tablet (0.4 mg total) under the tongue every 5 (five) minutes as needed for chest pain. Max three pills 45 tablet 0  . polyethylene glycol (MIRALAX / GLYCOLAX) packet Take 17 g by mouth daily as needed for mild constipation.    . protein supplement (RESOURCE BENEPROTEIN) POWD Take 6 g by mouth 3 (three) times daily with meals.  0  . rOPINIRole (REQUIP) 0.25 MG tablet Take 1 tablet (0.25 mg total) by mouth at bedtime. 30 tablet 3  . sertraline  (ZOLOFT) 100 MG tablet Take 200 mg by mouth daily.    . Vitamin D, Ergocalciferol, (DRISDOL) 50000 units CAPS capsule Take 50,000 Units by mouth every Thursday.     . warfarin (COUMADIN) 5 MG tablet Take 7.5 mg by mouth daily on Monday and Thursday. Take 5 mg by mouth daily on all other days 90 tablet 3   No current facility-administered medications for this visit.      Physical Exam  Blood pressure (!) 148/72, pulse 81, temperature 98.3 F (36.8 C), height '5\' 4"'$  (1.626 m), weight 213 lb (96.6 kg).  Constitutional: overall normal hygiene, normal nutrition,  well developed, normal grooming, normal body habitus. Assistive device:none  Musculoskeletal: gait and station Limp none, muscle tone and strength are normal, no tremors or atrophy is present.  .  Neurological: coordination overall normal.  Deep tendon reflex/nerve stretch intact.  Sensation normal.  Cranial nerves II-XII intact.   Skin:   Normal overall no scars, lesions, ulcers or rashes. No psoriasis.  Psychiatric: Alert and oriented x 3.  Recent memory intact, remote memory unclear.  Normal mood and affect. Well groomed.  Good eye contact.  Cardiovascular: overall no swelling, no varicosities, no edema bilaterally, normal temperatures of the legs and arms, no clubbing, cyanosis and good capillary refill.  Lymphatic: palpation is normal.  All other systems reviewed and are negative   Left shoulder has full motion.  NV intact.  She has no pain.  The patient has been educated about the nature of the problem(s) and counseled on treatment options.  The patient appeared to understand what I have discussed and is in agreement with it.  Encounter Diagnoses  Name Primary?  . Chronic left shoulder pain Yes  . Chronic pain of left ankle     PLAN Call if any problems.  Precautions discussed.  Continue current medications.   Return to clinic prn   I have reviewed the [facility] web site  prior to prescribing narcotic medicine for this patient.  Electronically Signed Sanjuana Kava, MD 9/25/20183:21 PM

## 2017-02-19 ENCOUNTER — Ambulatory Visit (INDEPENDENT_AMBULATORY_CARE_PROVIDER_SITE_OTHER): Payer: Medicare Other | Admitting: *Deleted

## 2017-02-19 DIAGNOSIS — Z5181 Encounter for therapeutic drug level monitoring: Secondary | ICD-10-CM | POA: Diagnosis not present

## 2017-02-19 DIAGNOSIS — I4891 Unspecified atrial fibrillation: Secondary | ICD-10-CM | POA: Diagnosis not present

## 2017-02-19 LAB — POCT INR: INR: 3.1

## 2017-02-26 ENCOUNTER — Ambulatory Visit (INDEPENDENT_AMBULATORY_CARE_PROVIDER_SITE_OTHER): Payer: Medicare Other | Admitting: *Deleted

## 2017-02-26 ENCOUNTER — Telehealth: Payer: Self-pay | Admitting: *Deleted

## 2017-02-26 DIAGNOSIS — Z5181 Encounter for therapeutic drug level monitoring: Secondary | ICD-10-CM | POA: Diagnosis not present

## 2017-02-26 DIAGNOSIS — I4891 Unspecified atrial fibrillation: Secondary | ICD-10-CM

## 2017-02-26 LAB — POCT INR: INR: 5.2

## 2017-02-26 NOTE — Telephone Encounter (Signed)
Done.  See coumadin note. 

## 2017-02-26 NOTE — Telephone Encounter (Signed)
INR 5.2 per [facility] [phone]

## 2017-02-28 ENCOUNTER — Ambulatory Visit (INDEPENDENT_AMBULATORY_CARE_PROVIDER_SITE_OTHER): Payer: Medicare Other | Admitting: *Deleted

## 2017-02-28 DIAGNOSIS — I480 Paroxysmal atrial fibrillation: Secondary | ICD-10-CM

## 2017-02-28 NOTE — Progress Notes (Signed)
Pt c/o increased SOB at rest. She states that her O2 sats are decreasing when sitting in her recliner and bed.

## 2017-03-05 ENCOUNTER — Ambulatory Visit (INDEPENDENT_AMBULATORY_CARE_PROVIDER_SITE_OTHER): Payer: Medicare Other | Admitting: Cardiology

## 2017-03-05 DIAGNOSIS — I4891 Unspecified atrial fibrillation: Secondary | ICD-10-CM | POA: Diagnosis not present

## 2017-03-05 DIAGNOSIS — Z5181 Encounter for therapeutic drug level monitoring: Secondary | ICD-10-CM

## 2017-03-05 LAB — POCT INR: INR: 1.6

## 2017-03-12 ENCOUNTER — Ambulatory Visit (INDEPENDENT_AMBULATORY_CARE_PROVIDER_SITE_OTHER): Payer: Medicare Other | Admitting: *Deleted

## 2017-03-12 DIAGNOSIS — I4891 Unspecified atrial fibrillation: Secondary | ICD-10-CM

## 2017-03-12 DIAGNOSIS — Z5181 Encounter for therapeutic drug level monitoring: Secondary | ICD-10-CM

## 2017-03-12 LAB — POCT INR: INR: 2

## 2017-03-18 ENCOUNTER — Telehealth: Payer: Self-pay | Admitting: Orthopaedic Surgery

## 2017-03-18 NOTE — Telephone Encounter (Signed)
Hydrocodone-Acetaminophen  5/325mg  Qty 56 Tablets °

## 2017-03-19 ENCOUNTER — Ambulatory Visit (INDEPENDENT_AMBULATORY_CARE_PROVIDER_SITE_OTHER): Payer: Medicare Other | Admitting: *Deleted

## 2017-03-19 ENCOUNTER — Telehealth: Payer: Self-pay | Admitting: *Deleted

## 2017-03-19 DIAGNOSIS — I4891 Unspecified atrial fibrillation: Secondary | ICD-10-CM

## 2017-03-19 DIAGNOSIS — Z5181 Encounter for therapeutic drug level monitoring: Secondary | ICD-10-CM

## 2017-03-19 LAB — POCT INR: INR: 2.1

## 2017-03-19 MED ORDER — HYDROCODONE-ACETAMINOPHEN 5-325 MG PO TABS: 1.0000 | ORAL_TABLET | Freq: Four times a day (QID) | ORAL | 0 refills | Status: DC | PRN

## 2017-03-19 NOTE — Telephone Encounter (Signed)
INR 2.1 Please call [name]  [phone]

## 2017-03-19 NOTE — Telephone Encounter (Signed)
Done.  See coumadin note. 

## 2017-03-26 ENCOUNTER — Ambulatory Visit (INDEPENDENT_AMBULATORY_CARE_PROVIDER_SITE_OTHER): Payer: Medicare Other | Admitting: *Deleted

## 2017-03-26 DIAGNOSIS — Z5181 Encounter for therapeutic drug level monitoring: Secondary | ICD-10-CM

## 2017-03-26 DIAGNOSIS — I82403 Acute embolism and thrombosis of unspecified deep veins of lower extremity, bilateral: Secondary | ICD-10-CM

## 2017-03-26 LAB — POCT INR: INR: 2.4

## 2017-03-28 ENCOUNTER — Other Ambulatory Visit: Payer: Self-pay | Admitting: "Endocrinology

## 2017-04-01 ENCOUNTER — Telehealth: Payer: Self-pay | Admitting: *Deleted

## 2017-04-01 ENCOUNTER — Ambulatory Visit (INDEPENDENT_AMBULATORY_CARE_PROVIDER_SITE_OTHER): Payer: Medicare Other | Admitting: *Deleted

## 2017-04-01 DIAGNOSIS — I82423 Acute embolism and thrombosis of iliac vein, bilateral: Secondary | ICD-10-CM

## 2017-04-01 DIAGNOSIS — Z5181 Encounter for therapeutic drug level monitoring: Secondary | ICD-10-CM | POA: Diagnosis not present

## 2017-04-01 LAB — POCT INR: INR: 3.2

## 2017-04-01 NOTE — Telephone Encounter (Signed)
Done.  See coumadin note. 

## 2017-04-01 NOTE — Progress Notes (Signed)
Done.  See coumadin note. 

## 2017-04-01 NOTE — Telephone Encounter (Signed)
PT 38.2 INR 3.2 Per Caryl Pina [phone]

## 2017-04-09 ENCOUNTER — Encounter: Payer: Self-pay | Admitting: Orthopaedic Surgery

## 2017-04-09 ENCOUNTER — Ambulatory Visit (INDEPENDENT_AMBULATORY_CARE_PROVIDER_SITE_OTHER): Payer: Medicare Other | Admitting: Orthopaedic Surgery

## 2017-04-09 VITALS — BP 151/75 | HR 87 | Temp 97.4°F | Ht 64.0 in | Wt 215.0 lb

## 2017-04-09 DIAGNOSIS — M25572 Pain in left ankle and joints of left foot: Secondary | ICD-10-CM | POA: Diagnosis not present

## 2017-04-09 DIAGNOSIS — G8929 Other chronic pain: Secondary | ICD-10-CM | POA: Diagnosis not present

## 2017-04-09 DIAGNOSIS — I6523 Occlusion and stenosis of bilateral carotid arteries: Secondary | ICD-10-CM | POA: Diagnosis not present

## 2017-04-09 MED ORDER — HYDROCODONE-ACETAMINOPHEN 5-325 MG PO TABS: 1.0000 | ORAL_TABLET | Freq: Four times a day (QID) | ORAL | 0 refills | Status: DC | PRN

## 2017-04-09 NOTE — Progress Notes (Signed)
Patient Briana [name], female DOB:[birth year], 71 y.o. ZCH:[id number]  Chief Complaint  Patient presents with  . Follow-up    Left ankle    HPI  Briana [name] OSHANA is a 71 y.o. female who has chronic ankle pain on the left.  She is on Coumadin and cannot take any NSAID.  She has no trauma.  She has swelling and popping at times.  She has used ice, heat, rubs, elevation with only slight help.  She has not changed shoes.  The pain medicine is the only thing that helps. HPI  Body mass index is 36.9 kg/m.  ROS  Review of Systems  HENT: Negative for congestion.   Respiratory: Negative for cough and shortness of breath.   Cardiovascular: Positive for leg swelling. Negative for chest pain.  Endocrine: Positive for cold intolerance.  Musculoskeletal: Positive for arthralgias, gait problem, joint swelling and myalgias.  Allergic/Immunologic: Positive for environmental allergies.    Past Medical History:  Diagnosis Date  . Adenomatous colon polyp 09/2007  . Arthritis   . Atrial fibrillation with rapid ventricular response ([place]) 10/05/2015  . Bradycardia   . CAD (coronary artery disease)    a. s/p CABG 2005.  . Carotid artery disease ([place])    a. s/p R CEA 2011. Followed by vascular -  duplex 04/16/16: <28% RICA, 78-67% LICA.  Marland Kitchen Chronic anemia   . Chronic diastolic CHF (congestive heart failure) ([place])   . Depression with anxiety   . Diabetes mellitus   . ESRD (end stage renal disease) ([place])    T Th Sat  . Heart murmur   . Hyperlipidemia   . Hypertension   . Neuropathy   . PAD (peripheral artery disease) ([place])   . Paroxysmal atrial flutter ([place])   . Peripheral vascular disease ([place])   . Pulmonary hypertension ([place])   . Rheumatic fever   . Sleep apnea     Past Surgical History:  Procedure Laterality Date  . CAROTID ENDARTERECTOMY  10/04/2009   Right CEA  . CORONARY ARTERY BYPASS GRAFT  2005  . TUBAL LIGATION      Family History  Problem Relation Age of Onset  .  Breast cancer Mother   . Heart disease Father   . Diabetes Father   . Heart disease Brother     Social History Social History   Tobacco Use  . Smoking status: Never Smoker  . Smokeless tobacco: Never Used  Substance Use Topics  . Alcohol use: No    Alcohol/week: 0.0 oz  . Drug use: No    Allergies  Allergen Reactions  . Tape Other (See Comments)    TAPE WILL CAUSE BLISTERS/SORES; PLEASE USE COBAN WRAP!!  . Gemfibrozil Other (See Comments)    Patient is not aware of this allergy but states that she had side effects to a cholesterol medication in the past  . Oxycodone Other (See Comments)    Hallucinations   . Carbamazepine Other (See Comments)    Reaction to tegretol - "loopy"  . Clonazepam Other (See Comments)    Sleepy   . Macrobid [Nitrofurantoin] Nausea And Vomiting    Current Outpatient Medications  Medication Sig Dispense Refill  . amLODipine (NORVASC) 10 MG tablet Take 10 mg by mouth daily.    Marland Kitchen aspirin EC 81 MG EC tablet Take 1 tablet (81 mg total) by mouth daily. 30 tablet 1  . atorvastatin (LIPITOR) 80 MG tablet Take 1 tablet (80 mg total) by mouth daily. 30 tablet 3  .  blood glucose meter kit and supplies KIT Dispense based on patient and insurance preference. Use up to four times daily as directed. (FOR ICD-10 E11.65) 1 each 0  . calcium acetate (PHOSLO) 667 MG capsule Take 2 capsules (1,334 mg total) by mouth 3 (three) times daily with meals. 180 capsule 1  . cetirizine (ZYRTEC) 10 MG tablet Take 10 mg by mouth daily.     . diazepam (VALIUM) 5 MG tablet Take 5 mg by mouth 3 (three) times daily as needed for anxiety.     . enoxaparin (LOVENOX) 100 MG/ML injection Inject 1 mL (100 mg total) into the skin daily. At 6am 10 Syringe 0  . ferric citrate (AURYXIA) 1 GM 210 MG(Fe) tablet Take 210 mg by mouth 3 (three) times daily with meals.     Marland Kitchen HUMALOG MIX 75/25 KWIKPEN (75-25) 100 UNIT/ML Kwikpen INJECT 35 UNITS S.Q. WITH BREAKFAST; 30 UNITS S.Q. WITH SUPPER WHEN  PRE-MEAL GLUCOSE IS ABOVE 90ML/DL. 15 mL 0  . HYDROcodone-acetaminophen (NORCO/VICODIN) 5-325 MG tablet Take 1 tablet every 6 (six) hours as needed by mouth for moderate pain (Must last 30 days.  Do not take and drive a car or use machinery.). 120 tablet 0  . metoprolol tartrate (LOPRESSOR) 25 MG tablet Take 1 tablet (25 mg total) by mouth 2 (two) times daily. 180 tablet 3  . nitroGLYCERIN (NITROSTAT) 0.4 MG SL tablet Place 1 tablet (0.4 mg total) under the tongue every 5 (five) minutes as needed for chest pain. Max three pills 45 tablet 0  . polyethylene glycol (MIRALAX / GLYCOLAX) packet Take 17 g by mouth daily as needed for mild constipation.    . protein supplement (RESOURCE BENEPROTEIN) POWD Take 6 g by mouth 3 (three) times daily with meals.  0  . rOPINIRole (REQUIP) 0.25 MG tablet Take 1 tablet (0.25 mg total) by mouth at bedtime. 30 tablet 3  . sertraline (ZOLOFT) 100 MG tablet Take 200 mg by mouth daily.    . Vitamin D, Ergocalciferol, (DRISDOL) 50000 units CAPS capsule Take 50,000 Units by mouth every Thursday.     . warfarin (COUMADIN) 5 MG tablet Take 7.5 mg by mouth daily on Monday and Thursday. Take 5 mg by mouth daily on all other days 90 tablet 3   No current facility-administered medications for this visit.      Physical Exam  Blood pressure (!) 151/75, pulse 87, temperature (!) 97.4 F (36.3 C), height _0  (1.626 m), weight 215 lb (97.5 kg).  Constitutional: overall normal hygiene, normal nutrition, well developed, normal grooming, normal body habitus. Assistive device:none  Musculoskeletal: gait and station Limp left, muscle tone and strength are normal, no tremors or atrophy is present.  .  Neurological: coordination overall normal.  Deep tendon reflex/nerve stretch intact.  Sensation normal.  Cranial nerves II-XII intact.   Skin:   Normal overall no scars, lesions, ulcers or rashes. No psoriasis.  Psychiatric: Alert and oriented x 3.  Recent memory intact, remote  memory unclear.  Normal mood and affect. Well groomed.  Good eye contact.  Cardiovascular: overall no swelling, no varicosities, no edema bilaterally, normal temperatures of the legs and arms, no clubbing, cyanosis and good capillary refill.  Lymphatic: palpation is normal.  All other systems reviewed and are negative   Left ankle has some slight swelling, more pain dorsally and anteriorly.  ROM is full.  She has a slight limp to the left.  NV intact.  She has no redness.  The patient has  been educated about the nature of the problem(s) and counseled on treatment options.  The patient appeared to understand what I have discussed and is in agreement with it.  Encounter Diagnosis  Name Primary?  . Chronic pain of left ankle Yes    PLAN Call if any problems.  Precautions discussed.  Continue current medications.   Return to clinic 6 weeks   I have reviewed the [place] web site prior to prescribing narcotic medicine for this patient.  Electronically Signed Sanjuana Kava, MD 11/14/201810:20 AM

## 2017-04-21 ENCOUNTER — Ambulatory Visit (INDEPENDENT_AMBULATORY_CARE_PROVIDER_SITE_OTHER): Payer: Medicare Other | Admitting: *Deleted

## 2017-04-21 DIAGNOSIS — Z5181 Encounter for therapeutic drug level monitoring: Secondary | ICD-10-CM

## 2017-04-21 DIAGNOSIS — I4891 Unspecified atrial fibrillation: Secondary | ICD-10-CM

## 2017-04-21 LAB — POCT INR: INR: 4

## 2017-05-03 IMAGING — DX DG ANKLE COMPLETE 3+V*L*
3 series · 3 of 3 positions shown · non-contrast
Comparison: None.

CLINICAL DATA: Continuous pain and swelling left ankle.  No injury.

EXAM:
LEFT ANKLE COMPLETE - 3+ VIEW

[ankle ap]
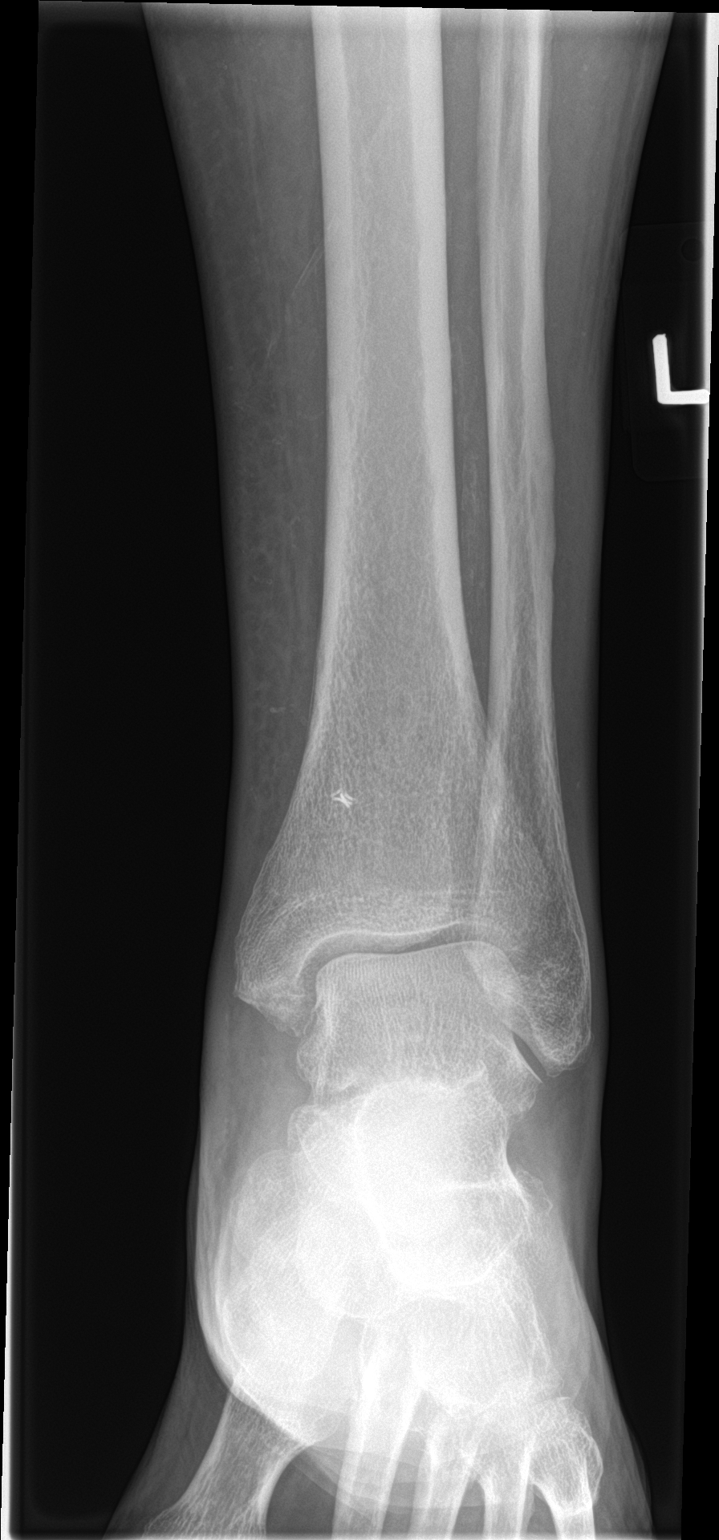

[ankle obl]
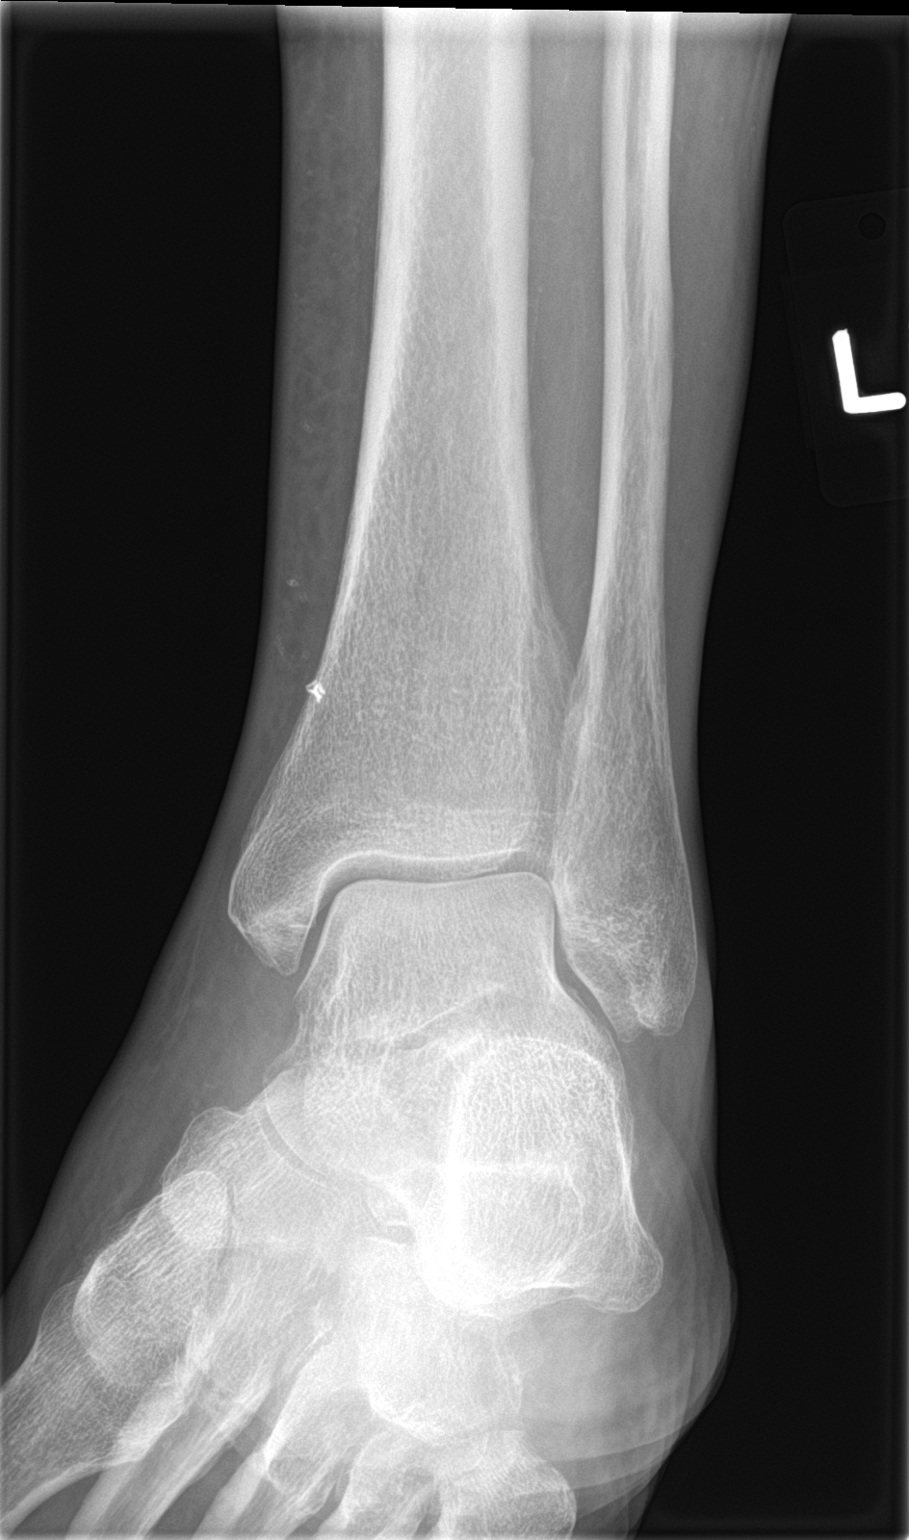

[ankle lat]
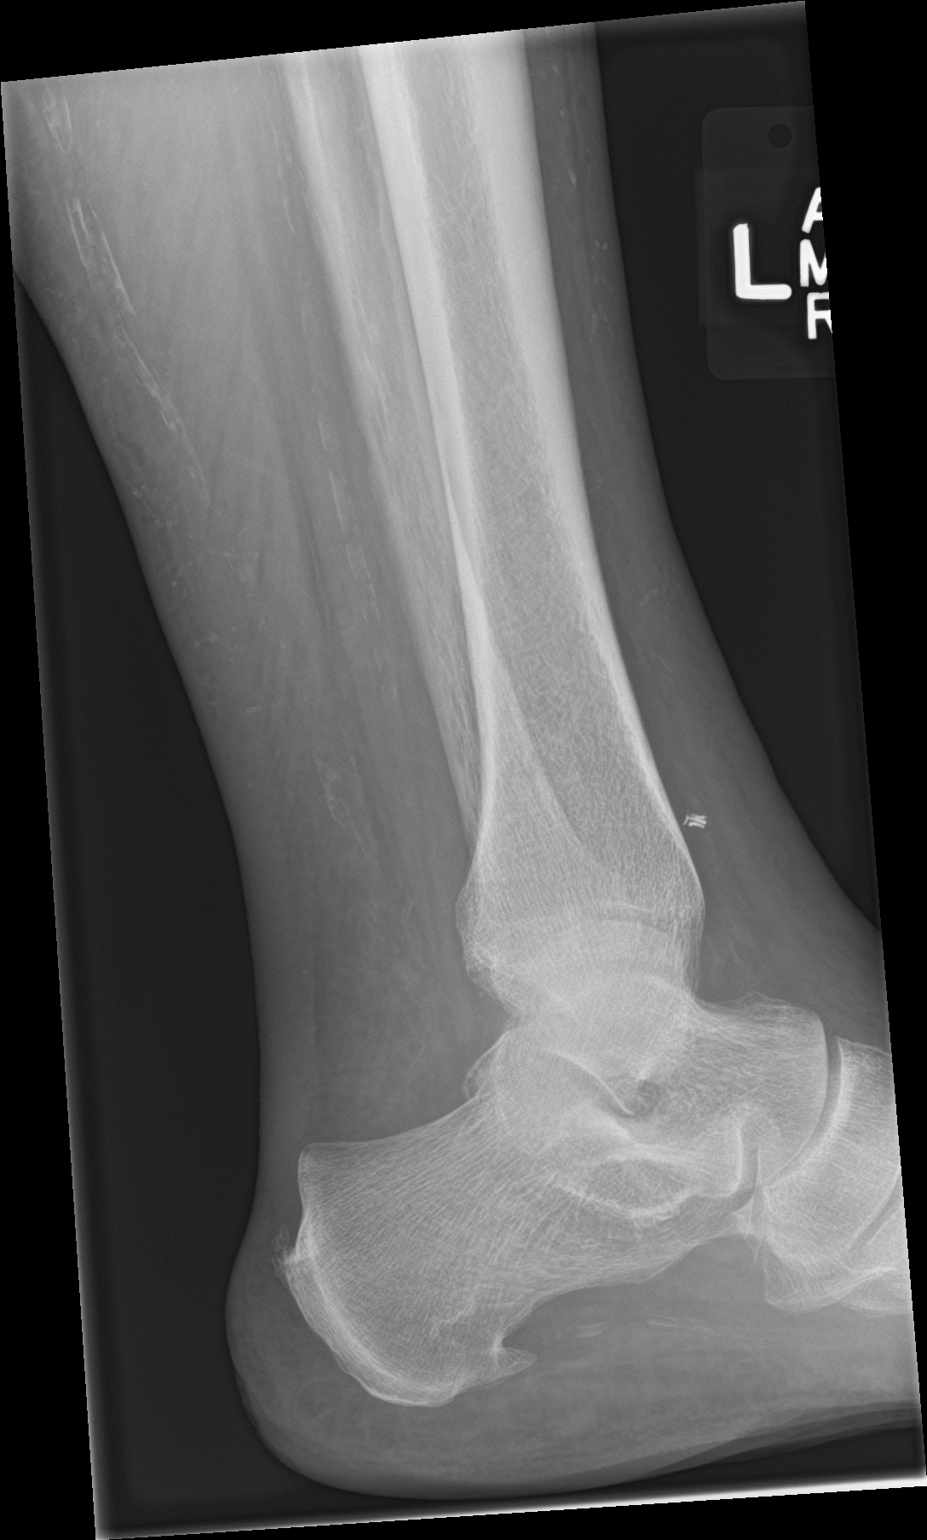

[3 of 3 positions shown; findings below may reference images not displayed]

FINDINGS: The ankle mortise is within normal. There is no acute fracture or
dislocation. There is a 3-4 mm metallic foreign body over the
anterior soft tissues adjacent the distal tibia. Mild degenerate
change of the hindfoot. Moderate size inferior calcaneal spur. Small
vessel atherosclerotic disease is present.
IMPRESSION: No acute findings.

Uhree-O mm metallic foreign body over the anterior soft tissues
adjacent the distal tibia.

Mild degenerative changes of the hindfoot. Moderate size inferior
calcaneal spur.

## 2017-05-09 ENCOUNTER — Telehealth: Payer: Self-pay | Admitting: Orthopaedic Surgery

## 2017-05-09 ENCOUNTER — Ambulatory Visit (INDEPENDENT_AMBULATORY_CARE_PROVIDER_SITE_OTHER): Payer: Medicare Other | Admitting: *Deleted

## 2017-05-09 DIAGNOSIS — I4891 Unspecified atrial fibrillation: Secondary | ICD-10-CM

## 2017-05-09 DIAGNOSIS — Z5181 Encounter for therapeutic drug level monitoring: Secondary | ICD-10-CM | POA: Diagnosis not present

## 2017-05-09 LAB — POCT INR: INR: 2.9

## 2017-05-09 MED ORDER — HYDROCODONE-ACETAMINOPHEN 5-325 MG PO TABS: 1.0000 | ORAL_TABLET | Freq: Four times a day (QID) | ORAL | 0 refills | Status: DC | PRN

## 2017-05-09 NOTE — Telephone Encounter (Signed)
Patient requests refill on Hydrocodone/Acetaminophen 5-325 mgs.  Qty  120       Sig: Take 1 tablet every 6 (six) hours as needed by mouth for moderate pain (Must last 30 days. Do not take and drive a car or use machinery.).   Patient uses [facility] in [place], [place]

## 2017-05-14 ENCOUNTER — Other Ambulatory Visit: Payer: Self-pay | Admitting: "Endocrinology

## 2017-05-14 ENCOUNTER — Ambulatory Visit: Payer: Medicare Other | Admitting: "Endocrinology

## 2017-05-14 DIAGNOSIS — E1165 Type 2 diabetes mellitus with hyperglycemia: Secondary | ICD-10-CM

## 2017-05-19 ENCOUNTER — Other Ambulatory Visit: Payer: Self-pay | Admitting: "Endocrinology

## 2017-05-19 ENCOUNTER — Encounter: Payer: Medicare Other | Admitting: *Deleted

## 2017-05-21 ENCOUNTER — Encounter: Payer: Self-pay | Admitting: Cardiology

## 2017-05-28 ENCOUNTER — Ambulatory Visit (INDEPENDENT_AMBULATORY_CARE_PROVIDER_SITE_OTHER): Payer: Medicare Other | Admitting: *Deleted

## 2017-05-28 DIAGNOSIS — I495 Sick sinus syndrome: Secondary | ICD-10-CM | POA: Diagnosis not present

## 2017-05-29 NOTE — Progress Notes (Signed)
Remote pacemaker transmission.   

## 2017-05-30 ENCOUNTER — Encounter: Payer: Self-pay | Admitting: Cardiology

## 2017-06-04 ENCOUNTER — Encounter: Payer: Self-pay | Admitting: Orthopaedic Surgery

## 2017-06-04 ENCOUNTER — Ambulatory Visit (INDEPENDENT_AMBULATORY_CARE_PROVIDER_SITE_OTHER): Payer: Medicare Other | Admitting: *Deleted

## 2017-06-04 ENCOUNTER — Ambulatory Visit (INDEPENDENT_AMBULATORY_CARE_PROVIDER_SITE_OTHER): Payer: Medicare Other | Admitting: Orthopaedic Surgery

## 2017-06-04 ENCOUNTER — Ambulatory Visit (INDEPENDENT_AMBULATORY_CARE_PROVIDER_SITE_OTHER): Payer: Medicare Other

## 2017-06-04 VITALS — BP 198/92 | HR 88 | Ht 64.0 in | Wt 214.0 lb

## 2017-06-04 DIAGNOSIS — I4891 Unspecified atrial fibrillation: Secondary | ICD-10-CM

## 2017-06-04 DIAGNOSIS — M25572 Pain in left ankle and joints of left foot: Secondary | ICD-10-CM | POA: Diagnosis not present

## 2017-06-04 DIAGNOSIS — G8929 Other chronic pain: Secondary | ICD-10-CM | POA: Diagnosis not present

## 2017-06-04 DIAGNOSIS — Z5181 Encounter for therapeutic drug level monitoring: Secondary | ICD-10-CM | POA: Diagnosis not present

## 2017-06-04 LAB — POCT INR: INR: 2.3

## 2017-06-04 MED ORDER — HYDROCODONE-ACETAMINOPHEN 5-325 MG PO TABS: 1.0000 | ORAL_TABLET | Freq: Four times a day (QID) | ORAL | 0 refills | Status: DC | PRN

## 2017-06-04 NOTE — Progress Notes (Signed)
Patient Briana [name], female DOB:01-Feb-1946, 72 y.o. HLK:[id number]  Chief Complaint  Patient presents with  . Follow-up    left ankle    HPI  Briana [name] is a 72 y.o. female who has chronic pain and swelling of the left ankle.  She has no redness.  She has no new trauma.  She uses a cane.  She has elevated the ankle and taken her medicine. HPI  Body mass index is 36.73 kg/m.  ROS  Review of Systems  HENT: Negative for congestion.   Respiratory: Negative for cough and shortness of breath.   Cardiovascular: Positive for leg swelling. Negative for chest pain.  Endocrine: Positive for cold intolerance.  Musculoskeletal: Positive for arthralgias, gait problem, joint swelling and myalgias.  Allergic/Immunologic: Positive for environmental allergies.  All other systems reviewed and are negative.   Past Medical History:  Diagnosis Date  . Adenomatous colon polyp 09/2007  . Arthritis   . Atrial fibrillation with rapid ventricular response ([place]) 10/05/2015  . Bradycardia   . CAD (coronary artery disease)    a. s/p CABG 2005.  . Carotid artery disease ([place])    a. s/p R CEA 2011. Followed by vascular -  duplex 04/16/16: <73% RICA, 42-87% LICA.  Marland Kitchen Chronic anemia   . Chronic diastolic CHF (congestive heart failure) ([place])   . Depression with anxiety   . Diabetes mellitus   . ESRD (end stage renal disease) ([facility] [place])    T Th Sat  . Heart murmur   . Hyperlipidemia   . Hypertension   . Neuropathy   . PAD (peripheral artery disease) ([place])   . Paroxysmal atrial flutter ([place])   . Peripheral vascular disease ([place])   . Pulmonary hypertension ([place])   . Rheumatic fever   . Sleep apnea     Past Surgical History:  Procedure Laterality Date  . BASCILIC VEIN TRANSPOSITION Left 12/04/2015   Procedure: BASILIC VEIN TRANSPOSITION;  Surgeon: Rosetta Posner, MD;  Location: [facility];  Service: Vascular;  Laterality: Left;  . CAROTID ENDARTERECTOMY  10/04/2009   Right CEA  . CORONARY  ARTERY BYPASS GRAFT  2005  . ENDARTERECTOMY Left 12/18/2016   Procedure: ENDARTERECTOMY CAROTID;  Surgeon: Rosetta Posner, MD;  Location: [facility];  Service: Vascular;  Laterality: Left;  . HEMATOMA EVACUATION Left 12/18/2016   Procedure: EVACUATION of left neck HEMATOMA;  Surgeon: Elam Dutch, MD;  Location: [facility];  Service: Vascular;  Laterality: Left;  . PACEMAKER IMPLANT N/A 11/06/2016   Procedure: Pacemaker Implant;  Surgeon: Evans Lance, MD;  Location: [facility];  Service: Cardiovascular;  Laterality: N/A;  . PATCH ANGIOPLASTY Left 12/18/2016   Procedure: PATCH ANGIOPLASTY WITH HEMASHIELD PLATIUM FINESSE PATCH;  Surgeon: Rosetta Posner, MD;  Location: [facility];  Service: Vascular;  Laterality: Left;  . PERIPHERAL VASCULAR CATHETERIZATION N/A 06/14/2016   Procedure: A/V Fistulagram - Left Upper;  Surgeon: Elam Dutch, MD;  Location: [facility];  Service: Cardiovascular;  Laterality: N/A;  . POLYPECTOMY  02/25/2012   Procedure: POLYPECTOMY;  Surgeon: Danie Binder, MD;  Location: [facility];  Service: Endoscopy;  Laterality: N/A;  . TUBAL LIGATION      Family History  Problem Relation Age of Onset  . Breast cancer Mother   . Heart disease Father   . Diabetes Father   . Heart disease Brother     Social History Social History   Tobacco Use  . Smoking status: Never Smoker  .  Smokeless tobacco: Never Used  Substance Use Topics  . Alcohol use: No    Alcohol/week: 0.0 oz  . Drug use: No    Allergies  Allergen Reactions  . Tape Other (See Comments)    TAPE WILL CAUSE BLISTERS/SORES; PLEASE USE COBAN WRAP!!  . Gemfibrozil Other (See Comments)    Patient is not aware of this allergy but states that she had side effects to a cholesterol medication in the past  . Oxycodone Other (See Comments)    Hallucinations   . Carbamazepine Other (See Comments)    Reaction to tegretol - "loopy"  . Clonazepam Other (See Comments)    Sleepy   . Macrobid [Nitrofurantoin]  Nausea And Vomiting    Current Outpatient Medications  Medication Sig Dispense Refill  . amLODipine (NORVASC) 10 MG tablet Take 10 mg by mouth daily.    Marland Kitchen aspirin EC 81 MG EC tablet Take 1 tablet (81 mg total) by mouth daily. 30 tablet 1  . atorvastatin (LIPITOR) 80 MG tablet Take 1 tablet (80 mg total) by mouth daily. 30 tablet 3  . blood glucose meter kit and supplies KIT Dispense based on patient and insurance preference. Use up to four times daily as directed. (FOR ICD-10 E11.65) 1 each 0  . calcium acetate (PHOSLO) 667 MG capsule Take 2 capsules (1,334 mg total) by mouth 3 (three) times daily with meals. 180 capsule 1  . cetirizine (ZYRTEC) 10 MG tablet Take 10 mg by mouth daily.     . diazepam (VALIUM) 5 MG tablet Take 5 mg by mouth 3 (three) times daily as needed for anxiety.     . enoxaparin (LOVENOX) 100 MG/ML injection Inject 1 mL (100 mg total) into the skin daily. At 6am 10 Syringe 0  . ferric citrate (AURYXIA) 1 GM 210 MG(Fe) tablet Take 210 mg by mouth 3 (three) times daily with meals.     Marland Kitchen HUMALOG MIX 75/25 KWIKPEN (75-25) 100 UNIT/ML Kwikpen INJECT 35 UNITS S.Q. WITH BREAKFAST; 30 UNITS S.Q. WITH SUPPER WHEN PRE-MEAL GLUCOSE IS ABOVE 90ML/DL. 15 mL 0  . HYDROcodone-acetaminophen (NORCO/VICODIN) 5-325 MG tablet Take 1 tablet by mouth every 6 (six) hours as needed for moderate pain (Must last 30 days.  Do not take and drive a car or use machinery.). 112 tablet 0  . metoprolol tartrate (LOPRESSOR) 25 MG tablet Take 1 tablet (25 mg total) by mouth 2 (two) times daily. 180 tablet 3  . nitroGLYCERIN (NITROSTAT) 0.4 MG SL tablet Place 1 tablet (0.4 mg total) under the tongue every 5 (five) minutes as needed for chest pain. Max three pills 45 tablet 0  . polyethylene glycol (MIRALAX / GLYCOLAX) packet Take 17 g by mouth daily as needed for mild constipation.    . protein supplement (RESOURCE BENEPROTEIN) POWD Take 6 g by mouth 3 (three) times daily with meals.  0  . rOPINIRole (REQUIP)  0.25 MG tablet Take 1 tablet (0.25 mg total) by mouth at bedtime. 30 tablet 3  . sertraline (ZOLOFT) 100 MG tablet Take 200 mg by mouth daily.    . Vitamin D, Ergocalciferol, (DRISDOL) 50000 units CAPS capsule Take 50,000 Units by mouth every Thursday.     . warfarin (COUMADIN) 5 MG tablet Take 7.5 mg by mouth daily on Monday and Thursday. Take 5 mg by mouth daily on all other days 90 tablet 3   No current facility-administered medications for this visit.      Physical Exam  Blood pressure (!) 198/92, pulse  88, height '5\' 4"'  (1.626 m), weight 214 lb (97.1 kg).  Constitutional: overall normal hygiene, normal nutrition, well developed, normal grooming, normal body habitus. Assistive device:cane  Musculoskeletal: gait and station Limp left, muscle tone and strength are normal, no tremors or atrophy is present.  .  Neurological: coordination overall normal.  Deep tendon reflex/nerve stretch intact.  Sensation normal.  Cranial nerves II-XII intact.   Skin:   Normal overall no scars, lesions, ulcers or rashes. No psoriasis.  Psychiatric: Alert and oriented x 3.  Recent memory intact, remote memory unclear.  Normal mood and affect. Well groomed.  Good eye contact.  Cardiovascular: overall no swelling, no varicosities, no edema bilaterally, normal temperatures of the legs and arms, no clubbing, cyanosis and good capillary refill.  Lymphatic: palpation is normal.  Left ankle has lateral swelling, no redness, ROM full, NV intact.  She has some swelling of the left foot.  All other systems reviewed and are negative   The patient has been educated about the nature of the problem(s) and counseled on treatment options.  The patient appeared to understand what I have discussed and is in agreement with it.  Encounter Diagnosis  Name Primary?  . Chronic pain of left ankle Yes    PLAN Call if any problems.  Precautions discussed.  Continue current medications.   Return to clinic 6 weeks   I  have reviewed the [place] web site prior to prescribing narcotic medicine for this patient.  Electronically Signed Sanjuana Kava, MD 1/9/201910:55 AM

## 2017-06-04 NOTE — Patient Instructions (Signed)
Continue coumadin 1 tablet daily   Recheck INR in 4 week.

## 2017-06-05 LAB — CUP PACEART REMOTE DEVICE CHECK
Battery Remaining Longevity: 119 mo
Battery Voltage: 3.07 V
Brady Statistic RA Percent Paced: 88.96 %
Brady Statistic RV Percent Paced: 0.1 %
Date Time Interrogation Session: 20190102183201
Implantable Lead Implant Date: 20180613
Implantable Lead Location: 753859
Implantable Lead Model: 3830
Implantable Lead Model: 5076
Implantable Pulse Generator Implant Date: 20180613
Lead Channel Impedance Value: 361 Ohm
Lead Channel Impedance Value: 418 Ohm
Lead Channel Sensing Intrinsic Amplitude: 5.125 mV
Lead Channel Setting Pacing Amplitude: 2.5 V
Lead Channel Setting Pacing Pulse Width: 0.5 ms
Lead Channel Setting Sensing Sensitivity: 2.8 mV
MDC IDC LEAD IMPLANT DT: 20180613
MDC IDC LEAD LOCATION: 753860
MDC IDC MSMT LEADCHNL RA SENSING INTR AMPL: 5.125 mV
MDC IDC MSMT LEADCHNL RV IMPEDANCE VALUE: 418 Ohm
MDC IDC MSMT LEADCHNL RV IMPEDANCE VALUE: 646 Ohm
MDC IDC MSMT LEADCHNL RV SENSING INTR AMPL: 13.625 mV
MDC IDC MSMT LEADCHNL RV SENSING INTR AMPL: 13.625 mV
MDC IDC SET LEADCHNL RA PACING AMPLITUDE: 2.5 V
MDC IDC STAT BRADY AP VP PERCENT: 0.1 %
MDC IDC STAT BRADY AP VS PERCENT: 89.23 %
MDC IDC STAT BRADY AS VP PERCENT: 0 %
MDC IDC STAT BRADY AS VS PERCENT: 10.67 %

## 2017-06-10 LAB — HEMOGLOBIN A1C: Hemoglobin A1C: 9.7

## 2017-06-16 ENCOUNTER — Other Ambulatory Visit: Payer: Self-pay | Admitting: "Endocrinology

## 2017-06-18 ENCOUNTER — Encounter: Payer: Self-pay | Admitting: Cardiovascular Disease

## 2017-06-18 ENCOUNTER — Ambulatory Visit (INDEPENDENT_AMBULATORY_CARE_PROVIDER_SITE_OTHER): Payer: Medicare Other | Admitting: Cardiovascular Disease

## 2017-06-18 ENCOUNTER — Encounter: Payer: Self-pay | Admitting: "Endocrinology

## 2017-06-18 ENCOUNTER — Ambulatory Visit (INDEPENDENT_AMBULATORY_CARE_PROVIDER_SITE_OTHER): Payer: Medicare Other | Admitting: *Deleted

## 2017-06-18 ENCOUNTER — Ambulatory Visit (INDEPENDENT_AMBULATORY_CARE_PROVIDER_SITE_OTHER): Payer: Medicare Other | Admitting: "Endocrinology

## 2017-06-18 VITALS — BP 158/89 | HR 82 | Ht 64.0 in | Wt 215.0 lb

## 2017-06-18 VITALS — BP 136/64 | HR 99 | Ht 64.0 in | Wt 215.0 lb

## 2017-06-18 DIAGNOSIS — I209 Angina pectoris, unspecified: Secondary | ICD-10-CM

## 2017-06-18 DIAGNOSIS — I4891 Unspecified atrial fibrillation: Secondary | ICD-10-CM | POA: Diagnosis not present

## 2017-06-18 DIAGNOSIS — N186 End stage renal disease: Secondary | ICD-10-CM | POA: Diagnosis not present

## 2017-06-18 DIAGNOSIS — I10 Essential (primary) hypertension: Secondary | ICD-10-CM | POA: Diagnosis not present

## 2017-06-18 DIAGNOSIS — I482 Chronic atrial fibrillation, unspecified: Secondary | ICD-10-CM

## 2017-06-18 DIAGNOSIS — I5032 Chronic diastolic (congestive) heart failure: Secondary | ICD-10-CM

## 2017-06-18 DIAGNOSIS — I25708 Atherosclerosis of coronary artery bypass graft(s), unspecified, with other forms of angina pectoris: Secondary | ICD-10-CM

## 2017-06-18 DIAGNOSIS — E1122 Type 2 diabetes mellitus with diabetic chronic kidney disease: Secondary | ICD-10-CM | POA: Diagnosis not present

## 2017-06-18 DIAGNOSIS — Z9119 Patient's noncompliance with other medical treatment and regimen: Secondary | ICD-10-CM | POA: Diagnosis not present

## 2017-06-18 DIAGNOSIS — Z91199 Patient's noncompliance with other medical treatment and regimen due to unspecified reason: Secondary | ICD-10-CM

## 2017-06-18 DIAGNOSIS — I6523 Occlusion and stenosis of bilateral carotid arteries: Secondary | ICD-10-CM

## 2017-06-18 DIAGNOSIS — Z6836 Body mass index (BMI) 36.0-36.9, adult: Secondary | ICD-10-CM | POA: Diagnosis not present

## 2017-06-18 DIAGNOSIS — E785 Hyperlipidemia, unspecified: Secondary | ICD-10-CM | POA: Diagnosis not present

## 2017-06-18 DIAGNOSIS — E782 Mixed hyperlipidemia: Secondary | ICD-10-CM | POA: Diagnosis not present

## 2017-06-18 DIAGNOSIS — R0609 Other forms of dyspnea: Secondary | ICD-10-CM | POA: Diagnosis not present

## 2017-06-18 DIAGNOSIS — Z5181 Encounter for therapeutic drug level monitoring: Secondary | ICD-10-CM

## 2017-06-18 LAB — POCT INR: INR: 2.8

## 2017-06-18 MED ORDER — METOPROLOL TARTRATE 50 MG PO TABS: 50.0000 mg | ORAL_TABLET | Freq: Two times a day (BID) | ORAL | 3 refills | Status: DC

## 2017-06-18 MED ORDER — INSULIN LISPRO PROT & LISPRO (75-25 MIX) 100 UNIT/ML KWIKPEN: PEN_INJECTOR | SUBCUTANEOUS | 2 refills | Status: DC

## 2017-06-18 NOTE — Progress Notes (Signed)
Subjective:    Patient ID: Briana [name], female    DOB: [birth year],    Past Medical History:  Diagnosis Date  . Adenomatous colon polyp 09/2007  . Arthritis   . Atrial fibrillation with rapid ventricular response ([place]) 10/05/2015  . Bradycardia   . CAD (coronary artery disease)    a. s/p CABG 2005.  . Carotid artery disease ([place])    a. s/p R CEA 2011. Followed by vascular -  duplex 04/16/16: <09% RICA, 62-83% LICA.  Marland Kitchen Chronic anemia   . Chronic diastolic CHF (congestive heart failure) ([place])   . Depression with anxiety   . Diabetes mellitus   . ESRD (end stage renal disease) ([place])    T Th Sat  . Heart murmur   . Hyperlipidemia   . Hypertension   . Neuropathy   . PAD (peripheral artery disease) ([place])   . Paroxysmal atrial flutter ([place])   . Peripheral vascular disease ([place])   . Pulmonary hypertension ([place])   . Rheumatic fever   . Sleep apnea    Past Surgical History:  Procedure Laterality Date  . BASCILIC VEIN TRANSPOSITION Left 12/04/2015   Procedure: BASILIC VEIN TRANSPOSITION;  Surgeon: Rosetta Posner, MD;  Location: [facility];  Service: Vascular;  Laterality: Left;  . CAROTID ENDARTERECTOMY  10/04/2009   Right CEA  . CORONARY ARTERY BYPASS GRAFT  2005  . ENDARTERECTOMY Left 12/18/2016   Procedure: ENDARTERECTOMY CAROTID;  Surgeon: Rosetta Posner, MD;  Location: [facility];  Service: Vascular;  Laterality: Left;  . HEMATOMA EVACUATION Left 12/18/2016   Procedure: EVACUATION of left neck HEMATOMA;  Surgeon: Elam Dutch, MD;  Location: [facility];  Service: Vascular;  Laterality: Left;  . PACEMAKER IMPLANT N/A 11/06/2016   Procedure: Pacemaker Implant;  Surgeon: Evans Lance, MD;  Location: [facility];  Service: Cardiovascular;  Laterality: N/A;  . PATCH ANGIOPLASTY Left 12/18/2016   Procedure: PATCH ANGIOPLASTY WITH HEMASHIELD PLATIUM FINESSE PATCH;  Surgeon: Rosetta Posner, MD;  Location: [facility];  Service: Vascular;  Laterality: Left;  . PERIPHERAL VASCULAR  CATHETERIZATION N/A 06/14/2016   Procedure: A/V Fistulagram - Left Upper;  Surgeon: Elam Dutch, MD;  Location: [facility];  Service: Cardiovascular;  Laterality: N/A;  . POLYPECTOMY  02/25/2012   Procedure: POLYPECTOMY;  Surgeon: Danie Binder, MD;  Location[facility];  Service: Endoscopy;  Laterality: N/A;  . TUBAL LIGATION     Social History   Socioeconomic History  . Marital status: Married    Spouse name: None  . Number of children: None  . Years of education: None  . Highest education level: None  Social Needs  . Financial resource strain: None  . Food insecurity - worry: None  . Food insecurity - inability: None  . Transportation needs - medical: None  . Transportation needs - non-medical: None  Occupational History  . None  Tobacco Use  . Smoking status: Never Smoker  . Smokeless tobacco: Never Used  Substance and Sexual Activity  . Alcohol use: No    Alcohol/week: 0.0 oz  . Drug use: No  . Sexual activity: None  Other Topics Concern  . None  Social History Narrative  . None   Outpatient Encounter Medications as of 06/18/2017  Medication Sig  . amLODipine (NORVASC) 10 MG tablet Take 10 mg by mouth daily.  Marland Kitchen aspirin EC 81 MG EC tablet Take 1 tablet (81 mg total) by mouth daily.  Marland Kitchen atorvastatin (  LIPITOR) 80 MG tablet Take 1 tablet (80 mg total) by mouth daily.  . blood glucose meter kit and supplies KIT Dispense based on patient and insurance preference. Use up to four times daily as directed. (FOR ICD-10 E11.65)  . calcium acetate (PHOSLO) 667 MG capsule Take 2 capsules (1,334 mg total) by mouth 3 (three) times daily with meals.  . cetirizine (ZYRTEC) 10 MG tablet Take 10 mg by mouth daily.   . diazepam (VALIUM) 5 MG tablet Take 5 mg by mouth 3 (three) times daily as needed for anxiety.   . enoxaparin (LOVENOX) 100 MG/ML injection Inject 1 mL (100 mg total) into the skin daily. At 6am  . ferric citrate (AURYXIA) 1 GM 210 MG(Fe) tablet Take 210 mg by mouth  3 (three) times daily with meals.   Marland Kitchen HYDROcodone-acetaminophen (NORCO/VICODIN) 5-325 MG tablet Take 1 tablet by mouth every 6 (six) hours as needed for moderate pain (Must last 30 days.  Do not take and drive a car or use machinery.).  . Insulin Lispro Prot & Lispro (HUMALOG MIX 75/25 KWIKPEN) (75-25) 100 UNIT/ML Kwikpen INJECT 35 UNITS S.Q. WITH BREAKFAST; 30 UNITS S.Q. WITH SUPPER WHEN PRE-MEAL GLUCOSE IS ABOVE 90 mg/DL.  Marland Kitchen metoprolol tartrate (LOPRESSOR) 50 MG tablet Take 1 tablet (50 mg total) by mouth 2 (two) times daily.  . nitroGLYCERIN (NITROSTAT) 0.4 MG SL tablet Place 1 tablet (0.4 mg total) under the tongue every 5 (five) minutes as needed for chest pain. Max three pills  . polyethylene glycol (MIRALAX / GLYCOLAX) packet Take 17 g by mouth daily as needed for mild constipation.  . protein supplement (RESOURCE BENEPROTEIN) POWD Take 6 g by mouth 3 (three) times daily with meals.  Marland Kitchen rOPINIRole (REQUIP) 0.25 MG tablet Take 1 tablet (0.25 mg total) by mouth at bedtime.  . sertraline (ZOLOFT) 100 MG tablet Take 200 mg by mouth daily.  . Vitamin D, Ergocalciferol, (DRISDOL) 50000 units CAPS capsule Take 50,000 Units by mouth every Thursday.   . warfarin (COUMADIN) 5 MG tablet Take 7.5 mg by mouth daily on Monday and Thursday. Take 5 mg by mouth daily on all other days  . [DISCONTINUED] HUMALOG MIX 75/25 KWIKPEN (75-25) 100 UNIT/ML Kwikpen INJECT 35 UNITS S.Q. WITH BREAKFAST; 30 UNITS S.Q. WITH SUPPER WHEN PRE-MEAL GLUCOSE IS ABOVE 90ML/DL.  . [DISCONTINUED] metoprolol tartrate (LOPRESSOR) 25 MG tablet Take 1 tablet (25 mg total) by mouth 2 (two) times daily.   No facility-administered encounter medications on file as of 06/18/2017.    ALLERGIES: Allergies  Allergen Reactions  . Tape Other (See Comments)    TAPE WILL CAUSE BLISTERS/SORES; PLEASE USE COBAN WRAP!!  . Gemfibrozil Other (See Comments)    Patient is not aware of this allergy but states that she had side effects to a cholesterol  medication in the past  . Oxycodone Other (See Comments)    Hallucinations   . Carbamazepine Other (See Comments)    Reaction to tegretol - "loopy"  . Clonazepam Other (See Comments)    Sleepy   . Macrobid [Nitrofurantoin] Nausea And Vomiting   VACCINATION STATUS: Immunization History  Administered Date(s) Administered  . Influenza,inj,Quad PF,6+ Mos 03/03/2014    Diabetes  She presents for her follow-up diabetic visit. She has type 2 diabetes mellitus. Onset time: She was diagnosed at approximate age of 58 years. Her disease course has been worsening. There are no hypoglycemic associated symptoms. Pertinent negatives for hypoglycemia include no confusion, headaches, pallor or seizures. There are no diabetic  associated symptoms. Pertinent negatives for diabetes include no chest pain, no fatigue, no polydipsia, no polyphagia and no polyuria. There are no hypoglycemic complications. Symptoms are worsening. Diabetic complications include nephropathy. Risk factors for coronary artery disease include dyslipidemia, diabetes mellitus, hypertension, obesity and sedentary lifestyle. Current diabetic treatment includes insulin injections. She is compliant with treatment most of the time. Her weight is stable. She is following a generally unhealthy diet. She has not had a previous visit with a dietitian (She declined dietitian visit.). She rarely participates in exercise. Her home blood glucose trend is increasing steadily. Her overall blood glucose range is >200 mg/dl. (She came with a meter which showed inadequate, random blood glucose monitoring 0-1 time a day averaging 210) An ACE inhibitor/angiotensin II receptor blocker is being taken. Eye exam is current.  Hyperlipidemia  This is a chronic problem. The current episode started more than 1 year ago. Exacerbating diseases include diabetes and obesity. Pertinent negatives include no chest pain, myalgias or shortness of breath. Current antihyperlipidemic  treatment includes statins. Risk factors for coronary artery disease include diabetes mellitus, dyslipidemia, hypertension, obesity and a sedentary lifestyle.  Hypertension  This is a chronic problem. The current episode started more than 1 year ago. The problem is uncontrolled. Pertinent negatives include no chest pain, headaches, palpitations or shortness of breath. Past treatments include ACE inhibitors and calcium channel blockers. Hypertensive end-organ damage includes kidney disease.    Review of Systems  Constitutional: Negative for fatigue and unexpected weight change.  HENT: Negative for trouble swallowing and voice change.   Eyes: Negative for visual disturbance.  Respiratory: Negative for cough, shortness of breath and wheezing.   Cardiovascular: Negative for chest pain, palpitations and leg swelling.  Gastrointestinal: Negative for diarrhea, nausea and vomiting.  Endocrine: Negative for cold intolerance, heat intolerance, polydipsia, polyphagia and polyuria.  Musculoskeletal: Negative for arthralgias and myalgias.  Skin: Negative for color change, pallor, rash and wound.  Neurological: Negative for seizures and headaches.  Psychiatric/Behavioral: Negative for confusion and suicidal ideas.    Objective:    BP (!) 158/89   Pulse 82   Ht '5\' 4"'  (1.626 m)   Wt 215 lb (97.5 kg)   BMI 36.90 kg/m   Wt Readings from Last 3 Encounters:  06/18/17 215 lb (97.5 kg)  06/18/17 215 lb (97.5 kg)  06/04/17 214 lb (97.1 kg)    Physical Exam  Constitutional: She is oriented to person, place, and time. She appears well-developed.  HENT:  Head: Normocephalic and atraumatic.  Eyes: EOM are normal.  Neck: Normal range of motion. Neck supple. No tracheal deviation present. No thyromegaly present.  Cardiovascular: Normal rate and regular rhythm.  Pulmonary/Chest: Effort normal and breath sounds normal.  Abdominal: Soft. Bowel sounds are normal. There is no tenderness. There is no guarding.   Musculoskeletal: Normal range of motion. She exhibits no edema.  Neurological: She is alert and oriented to person, place, and time. She has normal reflexes. No cranial nerve deficit. Coordination normal.  Skin: Skin is warm and dry. No rash noted. No erythema. No pallor.  Psychiatric: She has a normal mood and affect. Judgment normal.    Results for orders placed or performed in visit on 06/18/17  Hemoglobin A1c  Result Value Ref Range   Hemoglobin A1C 9.7    Diabetic Labs (most recent): Lab Results  Component Value Date   HGBA1C 9.7 06/10/2017   HGBA1C 7.6 (H) 11/08/2016   HGBA1C 8.6 07/10/2016     Assessment & Plan:  1. Type 2 diabetes mellitus with  ESRD on HD, CAD, with long-term current use of insulin ([place])  -Her diabetes is  complicated by ESRD on hemodialysis and patient remains at a high risk for more acute and chronic complications of diabetes which include CAD, CVA, CKD, retinopathy, and neuropathy. These are all discussed in detail with the patient.  - She missed her appointments since June 2018. She returns with a higher A1c of 9.7%, increasing from 7.6%.   - She came with Her logs in meter showing improved blood glucose profile, however still above target postprandial.  - I have re-counseled the patient on diet management and weight loss  by adopting a carbohydrate restricted / protein rich  Diet.  -  Suggestion is made for her to avoid simple carbohydrates  from her diet including Cakes, Sweet Desserts / Pastries, Ice Cream, Soda (diet and regular), Sweet Tea, Candies, Chips, Cookies, Store Bought Juices, Alcohol in Excess of  1-2 drinks a day, Artificial Sweeteners, and "Sugar-free" Products. This will help patient to have stable blood glucose profile and potentially avoid unintended weight gain.   - Patient is advised to stick to a routine mealtimes to eat 3 meals  a day and avoid unnecessary snacks ( to snack only to correct hypoglycemia).  - The patient has  declined a visit with CDE for individualized DM education.  - I have approached patient with the following individualized plan to manage diabetes and patient agrees.  - I advised her to resume Humalog 75/25  35 units with breakfast and 30 units with supper when glucose is above 90, associated with strict monitoring of glucose 4 times a day-before meals and at bedtime.  -Patient is warned not to take insulin without proper monitoring per orders.  -Patient is encouraged to call clinic for blood glucose levels less than 70 or above 300 mg /dl. - She once again has declined referral to CDE. -Patient is not a candidate for MTF, SGLT2 i due to CKD. - Insulin is the  exclusive safe choice of therapy for her at this time.  - Patient specific target  for A1c; LDL, HDL, Triglycerides, and  Waist Circumference were discussed in detail.  2) BP/HTN: uncontrolled, advised her to Continue current medications including ACEI/ARB. 3) Lipids/HPL:  continue statins. 4)  Weight/Diet: declined CDE consult, exercise, and carbohydrates information provided.   5. Vitamin D deficiency -I advised her to continue with vitamin D 2000 units daily.  6) Chronic Care/Health Maintenance:  -Patient is on ACEI/ARB and Statin medications and encouraged to continue to follow up with Ophthalmology, Podiatrist at least yearly or according to recommendations, and advised to  stay away from smoking. I have recommended yearly flu vaccine and pneumonia vaccination at least every 5 years;  and  sleep for at least 7 hours a day.  I advised patient to maintain close follow up with her PCP for primary care needs.  Follow up plan: Return in about 3 months (around 09/16/2017) for meter, and logs, follow up with pre-visit labs, meter, and logs.  Glade Lloyd, MD Phone: [phone]  Fax: [phone]  -  This note was partially dictated with voice recognition software. Similar sounding words can be transcribed inadequately or may not   be corrected upon review.  06/18/2017, 4:35 PM

## 2017-06-18 NOTE — Patient Instructions (Signed)

## 2017-06-18 NOTE — Patient Instructions (Signed)
Continue coumadin 1 tablet daily   Recheck INR in 4 week.

## 2017-06-18 NOTE — Patient Instructions (Addendum)
Your physician wants you to follow-up in: 3 months with Dr.Koneswaran    INCREASE Metoprolol to 50 mg twice a day    All other medications stay the same      No testing ordered today   If you need a refill on your cardiac medications before your next appointment, please call your pharmacy.     Thank you for choosing [facility] !

## 2017-06-18 NOTE — Progress Notes (Signed)
SUBJECTIVE: The patient presents for routine follow-up. She underwent pacemaker placement for symptomatic bradycardia in June 2018. She has permanent atrial fibrillation. She also has a history of coronary artery disease and CABG, hypertension, and end-stage renal disease on dialysis.  She underwent left carotid endarterectomy on 12/18/16.  She developed rapid ventricular rates in Dr. Lovena Le reinitiated metoprolol 25 mg twice daily.  She denies chest pain which is what she experienced prior to undergoing CABG.  She does feel fatigued particular on dialysis days.  She said she is short of breath when walking from her bedroom to the bathroom.  She said she was given intravenous iron to improve energy levels but it has not helped.  She tells me her blood sugar had gotten out of control for some time into the 500 range but her most recent blood sugar was 107.  INR 2.3 on 06/04/17.   Review of Systems: As per "subjective", otherwise negative.  Allergies  Allergen Reactions  . Tape Other (See Comments)    TAPE WILL CAUSE BLISTERS/SORES; PLEASE USE COBAN WRAP!!  . Gemfibrozil Other (See Comments)    Patient is not aware of this allergy but states that she had side effects to a cholesterol medication in the past  . Oxycodone Other (See Comments)    Hallucinations   . Carbamazepine Other (See Comments)    Reaction to tegretol - "loopy"  . Clonazepam Other (See Comments)    Sleepy   . Macrobid [Nitrofurantoin] Nausea And Vomiting    Current Outpatient Medications  Medication Sig Dispense Refill  . amLODipine (NORVASC) 10 MG tablet Take 10 mg by mouth daily.    Marland Kitchen aspirin EC 81 MG EC tablet Take 1 tablet (81 mg total) by mouth daily. 30 tablet 1  . atorvastatin (LIPITOR) 80 MG tablet Take 1 tablet (80 mg total) by mouth daily. 30 tablet 3  . blood glucose meter kit and supplies KIT Dispense based on patient and insurance preference. Use up to four times daily as directed. (FOR ICD-10  E11.65) 1 each 0  . calcium acetate (PHOSLO) 667 MG capsule Take 2 capsules (1,334 mg total) by mouth 3 (three) times daily with meals. 180 capsule 1  . cetirizine (ZYRTEC) 10 MG tablet Take 10 mg by mouth daily.     . diazepam (VALIUM) 5 MG tablet Take 5 mg by mouth 3 (three) times daily as needed for anxiety.     . enoxaparin (LOVENOX) 100 MG/ML injection Inject 1 mL (100 mg total) into the skin daily. At 6am 10 Syringe 0  . ferric citrate (AURYXIA) 1 GM 210 MG(Fe) tablet Take 210 mg by mouth 3 (three) times daily with meals.     Marland Kitchen HUMALOG MIX 75/25 KWIKPEN (75-25) 100 UNIT/ML Kwikpen INJECT 35 UNITS S.Q. WITH BREAKFAST; 30 UNITS S.Q. WITH SUPPER WHEN PRE-MEAL GLUCOSE IS ABOVE 90ML/DL. 15 mL 0  . HYDROcodone-acetaminophen (NORCO/VICODIN) 5-325 MG tablet Take 1 tablet by mouth every 6 (six) hours as needed for moderate pain (Must last 30 days.  Do not take and drive a car or use machinery.). 112 tablet 0  . nitroGLYCERIN (NITROSTAT) 0.4 MG SL tablet Place 1 tablet (0.4 mg total) under the tongue every 5 (five) minutes as needed for chest pain. Max three pills 45 tablet 0  . polyethylene glycol (MIRALAX / GLYCOLAX) packet Take 17 g by mouth daily as needed for mild constipation.    . protein supplement (RESOURCE BENEPROTEIN) POWD Take 6 g by mouth  3 (three) times daily with meals.  0  . rOPINIRole (REQUIP) 0.25 MG tablet Take 1 tablet (0.25 mg total) by mouth at bedtime. 30 tablet 3  . sertraline (ZOLOFT) 100 MG tablet Take 200 mg by mouth daily.    . Vitamin D, Ergocalciferol, (DRISDOL) 50000 units CAPS capsule Take 50,000 Units by mouth every Thursday.     . warfarin (COUMADIN) 5 MG tablet Take 7.5 mg by mouth daily on Monday and Thursday. Take 5 mg by mouth daily on all other days 90 tablet 3  . metoprolol tartrate (LOPRESSOR) 25 MG tablet Take 1 tablet (25 mg total) by mouth 2 (two) times daily. 180 tablet 3   No current facility-administered medications for this visit.     Past Medical  History:  Diagnosis Date  . Adenomatous colon polyp 09/2007  . Arthritis   . Atrial fibrillation with rapid ventricular response ([place]) 10/05/2015  . Bradycardia   . CAD (coronary artery disease)    a. s/p CABG 2005.  . Carotid artery disease ([place])    a. s/p R CEA 2011. Followed by vascular -  duplex 04/16/16: <40% RICA, 34-74% LICA.  Marland Kitchen Chronic anemia   . Chronic diastolic CHF (congestive heart failure) ([place])   . Depression with anxiety   . Diabetes mellitus   . ESRD (end stage renal disease) ([place])    T Th Sat  . Heart murmur   . Hyperlipidemia   . Hypertension   . Neuropathy   . PAD (peripheral artery disease) ([place])   . Paroxysmal atrial flutter ([place])   . Peripheral vascular disease ([place])   . Pulmonary hypertension ([place])   . Rheumatic fever   . Sleep apnea     Past Surgical History:  Procedure Laterality Date  . BASCILIC VEIN TRANSPOSITION Left 12/04/2015   Procedure: BASILIC VEIN TRANSPOSITION;  Surgeon: Rosetta Posner, MD;  Location: [facility];  Service: Vascular;  Laterality: Left;  . CAROTID ENDARTERECTOMY  10/04/2009   Right CEA  . CORONARY ARTERY BYPASS GRAFT  2005  . ENDARTERECTOMY Left 12/18/2016   Procedure: ENDARTERECTOMY CAROTID;  Surgeon: Rosetta Posner, MD;  Location: [facility];  Service: Vascular;  Laterality: Left;  . HEMATOMA EVACUATION Left 12/18/2016   Procedure: EVACUATION of left neck HEMATOMA;  Surgeon: Elam Dutch, MD;  Location: [facility];  Service: Vascular;  Laterality: Left;  . PACEMAKER IMPLANT N/A 11/06/2016   Procedure: Pacemaker Implant;  Surgeon: Evans Lance, MD;  Location: [facility];  Service: Cardiovascular;  Laterality: N/A;  . PATCH ANGIOPLASTY Left 12/18/2016   Procedure: PATCH ANGIOPLASTY WITH HEMASHIELD PLATIUM FINESSE PATCH;  Surgeon: Rosetta Posner, MD;  Location: [facility];  Service: Vascular;  Laterality: Left;  . PERIPHERAL VASCULAR CATHETERIZATION N/A 06/14/2016   Procedure: A/V Fistulagram - Left Upper;  Surgeon: Elam Dutch, MD;   Location: [facility];  Service: Cardiovascular;  Laterality: N/A;  . POLYPECTOMY  02/25/2012   Procedure: POLYPECTOMY;  Surgeon: Danie Binder, MD;  Location: [facility];  Service: Endoscopy;  Laterality: N/A;  . TUBAL LIGATION      Social History   Socioeconomic History  . Marital status: Married    Spouse name: Not on file  . Number of children: Not on file  . Years of education: Not on file  . Highest education level: Not on file  Social Needs  . Financial resource strain: Not on file  . Food insecurity - worry: Not on file  .  Food insecurity - inability: Not on file  . Transportation needs - medical: Not on file  . Transportation needs - non-medical: Not on file  Occupational History  . Not on file  Tobacco Use  . Smoking status: Never Smoker  . Smokeless tobacco: Never Used  Substance and Sexual Activity  . Alcohol use: No    Alcohol/week: 0.0 oz  . Drug use: No  . Sexual activity: Not on file  Other Topics Concern  . Not on file  Social History Narrative  . Not on file     Vitals:   06/18/17 1243  BP: 136/64  Pulse: 99  SpO2: 94%  Weight: 215 lb (97.5 kg)  Height: _0  (1.626 m)    Wt Readings from Last 3 Encounters:  06/18/17 215 lb (97.5 kg)  06/04/17 214 lb (97.1 kg)  04/09/17 215 lb (97.5 kg)     PHYSICAL EXAM General: NAD HEENT: Normal. Neck: No JVD, no thyromegaly. Lungs: Clear to auscultation bilaterally with normal respiratory effort. CV: Regular rate and irregular rhythm, normal S1/S2, no S3, no murmur. No pretibial or periankle edema. Abdomen: Soft, nontender, no distention.  Neurologic: Alert and oriented.  Psych: Normal affect. Skin: Normal. Musculoskeletal: No gross deformities.    ECG: Most recent ECG reviewed.   Labs: Lab Results  Component Value Date/Time   K 4.2 12/19/2016 04:25 AM   BUN 22 (H) 12/19/2016 04:25 AM   CREATININE 3.19 (H) 12/19/2016 04:25 AM   CREATININE 3.50 (H) 04/25/2016 04:14 PM   ALT 13 (L)  12/16/2016 10:55 AM   TSH 2.454 10/09/2016 07:14 PM   TSH 3.33 04/25/2016 04:14 PM   HGB 10.9 (L) 12/18/2016 06:21 AM     Lipids: Lab Results  Component Value Date/Time   [facility]  04/30/2009 03:23 AM    71        Total Cholesterol/HDL:CHD Risk Coronary Heart Disease Risk Table                     Men   Women  1/2 Average Risk   3.4   3.3  Average Risk       5.0   4.4  2 X Average Risk   9.6   7.1  3 X Average Risk  23.4   11.0        Use the calculated Patient Ratio above and the CHD Risk Table to determine the patient's CHD Risk.        ATP III CLASSIFICATION (LDL):  <100     mg/dL   Optimal  100-129  mg/dL   Near or Above                    Optimal  130-159  mg/dL   Borderline  160-189  mg/dL   High  >190     mg/dL   Very High   CHOL  04/30/2009 03:23 AM    142        ATP III CLASSIFICATION:  <200     mg/dL   Desirable  200-239  mg/dL   Borderline High  >=240    mg/dL   High          TRIG 151 (H) 04/30/2009 03:23 AM   HDL 41 04/30/2009 03:23 AM       ASSESSMENT AND PLAN: 1. CAD with h/o 3-v CABG in 04/2004 with exertional dyspnea: Symptomatically stable. Continue aspirin, metoprolol, and Lipitor. Stress testing in May 2017 showed mild peri-infarct  ischemia in the anterior and anteroseptal walls. I will try increasing metoprolol to 50 mg twice daily to see if this helps to improve her symptoms.  If it does not, I would consider a stress test.  2. Essential HTN:  Controlled.  No changes.  3. Hyperlipidemia: Continue high-dose Lipitor.  4. History of bilateral CEA: She underwent left carotid endarterectomy on 12/18/16. Continue ASA and Lipitor.  5. Chronic diastolic heart failure: Euvolemic. Volume managed by dialysis.  6. Permanent atrial fibrillation and atrial flutter and symptomatic bradycardia s/p pacemaker with exertional dyspnea: Anticoagulated with warfarin. Metoprolol 25 mg twice daily reinitiated by EP due to rapid ventricular rates.  I will try  increasing metoprolol to 50 milligrams twice daily to see if this helps her symptoms.    Disposition: Follow up 3 months   Kate Sable, M.D., F.A.C.C.

## 2017-07-08 ENCOUNTER — Ambulatory Visit: Payer: Medicare Other | Admitting: Vascular Surgery

## 2017-07-08 ENCOUNTER — Encounter (HOSPITAL_COMMUNITY): Payer: Medicare Other

## 2017-07-23 ENCOUNTER — Ambulatory Visit (INDEPENDENT_AMBULATORY_CARE_PROVIDER_SITE_OTHER): Payer: Medicare Other | Admitting: Orthopaedic Surgery

## 2017-07-23 ENCOUNTER — Encounter: Payer: Self-pay | Admitting: Orthopaedic Surgery

## 2017-07-23 ENCOUNTER — Ambulatory Visit (INDEPENDENT_AMBULATORY_CARE_PROVIDER_SITE_OTHER): Payer: Medicare Other | Admitting: *Deleted

## 2017-07-23 VITALS — BP 118/57 | HR 77 | Temp 98.0°F | Ht 64.0 in | Wt 214.0 lb

## 2017-07-23 DIAGNOSIS — I4891 Unspecified atrial fibrillation: Secondary | ICD-10-CM | POA: Diagnosis not present

## 2017-07-23 DIAGNOSIS — Z5181 Encounter for therapeutic drug level monitoring: Secondary | ICD-10-CM | POA: Diagnosis not present

## 2017-07-23 DIAGNOSIS — G8929 Other chronic pain: Secondary | ICD-10-CM

## 2017-07-23 DIAGNOSIS — M25572 Pain in left ankle and joints of left foot: Secondary | ICD-10-CM | POA: Diagnosis not present

## 2017-07-23 DIAGNOSIS — I6523 Occlusion and stenosis of bilateral carotid arteries: Secondary | ICD-10-CM | POA: Diagnosis not present

## 2017-07-23 LAB — POCT INR: INR: 3.8

## 2017-07-23 MED ORDER — HYDROCODONE-ACETAMINOPHEN 5-325 MG PO TABS: 1.0000 | ORAL_TABLET | Freq: Four times a day (QID) | ORAL | 0 refills | Status: DC | PRN

## 2017-07-23 NOTE — Patient Instructions (Signed)
Hold coumadin tonight then resume 1 tablet daily   Recheck INR in 3 week.

## 2017-07-23 NOTE — Progress Notes (Signed)
Patient YI:FOYDX MORAYO LEVEN, female DOB:[birth year], 72 y.o. AJO:[id number]  Chief Complaint  Patient presents with  . Ankle Pain    left    HPI  Briana [name] is a 72 y.o. female who has chronic left ankle pain. She has had more pain with the cold weather.  She has no new trauma, no redness, no decreased sensation.  She has had some swelling. HPI  Body mass index is 36.73 kg/m.  ROS  Review of Systems  HENT: Negative for congestion.   Respiratory: Negative for cough and shortness of breath.   Cardiovascular: Positive for leg swelling. Negative for chest pain.  Endocrine: Positive for cold intolerance.  Musculoskeletal: Positive for arthralgias, gait problem, joint swelling and myalgias.  Allergic/Immunologic: Positive for environmental allergies.  All other systems reviewed and are negative.   Past Medical History:  Diagnosis Date  . Adenomatous colon polyp 09/2007  . Arthritis   . Atrial fibrillation with rapid ventricular response ([place]) 10/05/2015  . Bradycardia   . CAD (coronary artery disease)    a. s/p CABG 2005.  . Carotid artery disease ([place])    a. s/p R CEA 2011. Followed by vascular -  duplex 04/16/16: <94% RICA, 70-96% LICA.  Marland Kitchen Chronic anemia   . Chronic diastolic CHF (congestive heart failure) ([place])   . Depression with anxiety   . Diabetes mellitus   . ESRD (end stage renal disease) ([place])    T Th Sat  . Heart murmur   . Hyperlipidemia   . Hypertension   . Neuropathy   . PAD (peripheral artery disease) ([place])   . Paroxysmal atrial flutter ([place])   . Peripheral vascular disease ([place])   . Pulmonary hypertension ([place])   . Rheumatic fever   . Sleep apnea     Past Surgical History:  Procedure Laterality Date  . BASCILIC VEIN TRANSPOSITION Left 12/04/2015   Procedure: BASILIC VEIN TRANSPOSITION;  Surgeon: Rosetta Posner, MD;  Location: [facility];  Service: Vascular;  Laterality: Left;  . CAROTID ENDARTERECTOMY  10/04/2009   Right CEA  . CORONARY ARTERY  BYPASS GRAFT  2005  . ENDARTERECTOMY Left 12/18/2016   Procedure: ENDARTERECTOMY CAROTID;  Surgeon: Rosetta Posner, MD;  Location: [facility];  Service: Vascular;  Laterality: Left;  . HEMATOMA EVACUATION Left 12/18/2016   Procedure: EVACUATION of left neck HEMATOMA;  Surgeon: Elam Dutch, MD;  Location: [facility];  Service: Vascular;  Laterality: Left;  . PACEMAKER IMPLANT N/A 11/06/2016   Procedure: Pacemaker Implant;  Surgeon: Evans Lance, MD;  Location: [facility];  Service: Cardiovascular;  Laterality: N/A;  . PATCH ANGIOPLASTY Left 12/18/2016   Procedure: PATCH ANGIOPLASTY WITH HEMASHIELD PLATIUM FINESSE PATCH;  Surgeon: Rosetta Posner, MD;  Location: [facility];  Service: Vascular;  Laterality: Left;  . PERIPHERAL VASCULAR CATHETERIZATION N/A 06/14/2016   Procedure: A/V Fistulagram - Left Upper;  Surgeon: Elam Dutch, MD;  Location: [facility];  Service: Cardiovascular;  Laterality: N/A;  . POLYPECTOMY  02/25/2012   Procedure: POLYPECTOMY;  Surgeon: Danie Binder, MD;  Location: [facility];  Service: Endoscopy;  Laterality: N/A;  . TUBAL LIGATION      Family History  Problem Relation Age of Onset  . Breast cancer Mother   . Heart disease Father   . Diabetes Father   . Heart disease Brother     Social History Social History   Tobacco Use  . Smoking status: Never Smoker  . Smokeless tobacco: Never  Used  Substance Use Topics  . Alcohol use: No    Alcohol/week: 0.0 oz  . Drug use: No    Allergies  Allergen Reactions  . Tape Other (See Comments)    TAPE WILL CAUSE BLISTERS/SORES; PLEASE USE COBAN WRAP!!  . Gemfibrozil Other (See Comments)    Patient is not aware of this allergy but states that she had side effects to a cholesterol medication in the past  . Oxycodone Other (See Comments)    Hallucinations   . Carbamazepine Other (See Comments)    Reaction to tegretol - "loopy"  . Clonazepam Other (See Comments)    Sleepy   . Macrobid [Nitrofurantoin] Nausea And  Vomiting    Current Outpatient Medications  Medication Sig Dispense Refill  . amLODipine (NORVASC) 10 MG tablet Take 10 mg by mouth daily.    Marland Kitchen aspirin EC 81 MG EC tablet Take 1 tablet (81 mg total) by mouth daily. 30 tablet 1  . atorvastatin (LIPITOR) 80 MG tablet Take 1 tablet (80 mg total) by mouth daily. 30 tablet 3  . blood glucose meter kit and supplies KIT Dispense based on patient and insurance preference. Use up to four times daily as directed. (FOR ICD-10 E11.65) 1 each 0  . calcium acetate (PHOSLO) 667 MG capsule Take 2 capsules (1,334 mg total) by mouth 3 (three) times daily with meals. 180 capsule 1  . cetirizine (ZYRTEC) 10 MG tablet Take 10 mg by mouth daily.     . diazepam (VALIUM) 5 MG tablet Take 5 mg by mouth 3 (three) times daily as needed for anxiety.     . enoxaparin (LOVENOX) 100 MG/ML injection Inject 1 mL (100 mg total) into the skin daily. At 6am 10 Syringe 0  . ferric citrate (AURYXIA) 1 GM 210 MG(Fe) tablet Take 210 mg by mouth 3 (three) times daily with meals.     Marland Kitchen HYDROcodone-acetaminophen (NORCO/VICODIN) 5-325 MG tablet Take 1 tablet by mouth every 6 (six) hours as needed for moderate pain (Must last 30 days). 112 tablet 0  . Insulin Lispro Prot & Lispro (HUMALOG MIX 75/25 KWIKPEN) (75-25) 100 UNIT/ML Kwikpen INJECT 35 UNITS S.Q. WITH BREAKFAST; 30 UNITS S.Q. WITH SUPPER WHEN PRE-MEAL GLUCOSE IS ABOVE 90 mg/DL. 10 pen 2  . metoprolol tartrate (LOPRESSOR) 50 MG tablet Take 1 tablet (50 mg total) by mouth 2 (two) times daily. 180 tablet 3  . nitroGLYCERIN (NITROSTAT) 0.4 MG SL tablet Place 1 tablet (0.4 mg total) under the tongue every 5 (five) minutes as needed for chest pain. Max three pills 45 tablet 0  . polyethylene glycol (MIRALAX / GLYCOLAX) packet Take 17 g by mouth daily as needed for mild constipation.    . protein supplement (RESOURCE BENEPROTEIN) POWD Take 6 g by mouth 3 (three) times daily with meals.  0  . rOPINIRole (REQUIP) 0.25 MG tablet Take 1  tablet (0.25 mg total) by mouth at bedtime. 30 tablet 3  . sertraline (ZOLOFT) 100 MG tablet Take 200 mg by mouth daily.    . Vitamin D, Ergocalciferol, (DRISDOL) 50000 units CAPS capsule Take 50,000 Units by mouth every Thursday.     . warfarin (COUMADIN) 5 MG tablet Take 7.5 mg by mouth daily on Monday and Thursday. Take 5 mg by mouth daily on all other days 90 tablet 3   No current facility-administered medications for this visit.      Physical Exam  Blood pressure (!) 118/57, pulse 77, temperature 98 F (36.7 C), height 5'  4" (1.626 m), weight 214 lb (97.1 kg).  Constitutional: overall normal hygiene, normal nutrition, well developed, normal grooming, normal body habitus. Assistive device:cane  Musculoskeletal: gait and station Limp left, muscle tone and strength are normal, no tremors or atrophy is present.  .  Neurological: coordination overall normal.  Deep tendon reflex/nerve stretch intact.  Sensation normal.  Cranial nerves II-XII intact.   Skin:   Normal overall no scars, lesions, ulcers or rashes. No psoriasis.  Psychiatric: Alert and oriented x 3.  Recent memory intact, remote memory unclear.  Normal mood and affect. Well groomed.  Good eye contact.  Cardiovascular: overall no swelling, no varicosities, no edema bilaterally, normal temperatures of the legs and arms, no clubbing, cyanosis and good capillary refill.  Lymphatic: palpation is normal.  Left ankle has no swelling today.  It is diffusely tender more laterally. ROM is full.  NV intact.  She has a slight left limp. All other systems reviewed and are negative   The patient has been educated about the nature of the problem(s) and counseled on treatment options.  The patient appeared to understand what I have discussed and is in agreement with it.  No diagnosis found.  PLAN Call if any problems.  Precautions discussed.  Continue current medications.   Return to clinic 3 months   I have reviewed the [place] web site prior to prescribing narcotic medicine for this patient.  Electronically Signed Sanjuana Kava, MD 2/27/20192:45 PM

## 2017-08-05 ENCOUNTER — Encounter: Payer: Self-pay | Admitting: Vascular Surgery

## 2017-08-05 ENCOUNTER — Ambulatory Visit (HOSPITAL_COMMUNITY)
Admission: RE | Admit: 2017-08-05 | Discharge: 2017-08-05 | Disposition: A | Discharge status: Home / Self Care | Payer: Medicare Other | Source: Ambulatory Visit | Attending: Vascular Surgery | Admitting: Vascular Surgery

## 2017-08-05 ENCOUNTER — Other Ambulatory Visit: Payer: Self-pay

## 2017-08-05 ENCOUNTER — Ambulatory Visit (INDEPENDENT_AMBULATORY_CARE_PROVIDER_SITE_OTHER): Payer: Medicare Other | Admitting: Vascular Surgery

## 2017-08-05 VITALS — BP 112/65 | HR 79 | Temp 98.7°F | Resp 20 | Ht 64.0 in | Wt 216.0 lb

## 2017-08-05 DIAGNOSIS — I6522 Occlusion and stenosis of left carotid artery: Secondary | ICD-10-CM | POA: Diagnosis present

## 2017-08-05 DIAGNOSIS — I6523 Occlusion and stenosis of bilateral carotid arteries: Secondary | ICD-10-CM

## 2017-08-05 NOTE — Progress Notes (Signed)
History of Present Illness:  Patient is a 72 y.o. year old female who presents for evaluation of carotid stenosis s/p left CEA 12/18/2016.  She is doing well over all.  She denise amaurosis, new weakness in extremities and aphasia.  Her daughter is with her today.    Past Medical History:  Diagnosis Date  . Adenomatous colon polyp 09/2007  . Arthritis   . Atrial fibrillation with rapid ventricular response ([place]) 10/05/2015  . Bradycardia   . CAD (coronary artery disease)    a. s/p CABG 2005.  . Carotid artery disease ([place])    a. s/p R CEA 2011. Followed by vascular -  duplex 04/16/16: <62% RICA, 26-33% LICA.  Marland Kitchen Chronic anemia   . Chronic diastolic CHF (congestive heart failure) ([place])   . Depression with anxiety   . Diabetes mellitus   . ESRD (end stage renal disease) ([place])    T Th Sat  . Heart murmur   . Hyperlipidemia   . Hypertension   . Neuropathy   . PAD (peripheral artery disease) ([place])   . Paroxysmal atrial flutter ([place])   . Peripheral vascular disease ([place])   . Pulmonary hypertension ([place])   . Rheumatic fever   . Sleep apnea     Past Surgical History:  Procedure Laterality Date  . BASCILIC VEIN TRANSPOSITION Left 12/04/2015   Procedure: BASILIC VEIN TRANSPOSITION;  Surgeon: Rosetta Posner, MD;  Location: [facility];  Service: Vascular;  Laterality: Left;  . CAROTID ENDARTERECTOMY  10/04/2009   Right CEA  . CORONARY ARTERY BYPASS GRAFT  2005  . ENDARTERECTOMY Left 12/18/2016   Procedure: ENDARTERECTOMY CAROTID;  Surgeon: Rosetta Posner, MD;  Location: [facility];  Service: Vascular;  Laterality: Left;  . HEMATOMA EVACUATION Left 12/18/2016   Procedure: EVACUATION of left neck HEMATOMA;  Surgeon: Elam Dutch, MD;  Location: [facility];  Service: Vascular;  Laterality: Left;  . PACEMAKER IMPLANT N/A 11/06/2016   Procedure: Pacemaker Implant;  Surgeon: Evans Lance, MD;  Location: [facility];  Service: Cardiovascular;  Laterality: N/A;  . PATCH ANGIOPLASTY Left  12/18/2016   Procedure: PATCH ANGIOPLASTY WITH HEMASHIELD PLATIUM FINESSE PATCH;  Surgeon: Rosetta Posner, MD;  Location: [facility];  Service: Vascular;  Laterality: Left;  . PERIPHERAL VASCULAR CATHETERIZATION N/A 06/14/2016   Procedure: A/V Fistulagram - Left Upper;  Surgeon: Elam Dutch, MD;  Location: [facility];  Service: Cardiovascular;  Laterality: N/A;  . POLYPECTOMY  02/25/2012   Procedure: POLYPECTOMY;  Surgeon: Danie Binder, MD;  [facility];  Service: Endoscopy;  Laterality: N/A;  . TUBAL LIGATION       Social History Social History   Tobacco Use  . Smoking status: Never Smoker  . Smokeless tobacco: Never Used  Substance Use Topics  . Alcohol use: No    Alcohol/week: 0.0 oz  . Drug use: No    Family History Family History  Problem Relation Age of Onset  . Breast cancer Mother   . Heart disease Father   . Diabetes Father   . Heart disease Brother     Allergies  Allergies  Allergen Reactions  . Tape Other (See Comments)    TAPE WILL CAUSE BLISTERS/SORES; PLEASE USE COBAN WRAP!!  . Gemfibrozil Other (See Comments)    Patient is not aware of this allergy but states that she had side effects to a cholesterol medication in the past  . Oxycodone Other (See Comments)  Hallucinations   . Carbamazepine Other (See Comments)    Reaction to tegretol - "loopy"  . Clonazepam Other (See Comments)    Sleepy   . Macrobid [Nitrofurantoin] Nausea And Vomiting     Current Outpatient Medications  Medication Sig Dispense Refill  . amLODipine (NORVASC) 10 MG tablet Take 10 mg by mouth daily.    Marland Kitchen aspirin EC 81 MG EC tablet Take 1 tablet (81 mg total) by mouth daily. 30 tablet 1  . atorvastatin (LIPITOR) 80 MG tablet Take 1 tablet (80 mg total) by mouth daily. 30 tablet 3  . blood glucose meter kit and supplies KIT Dispense based on patient and insurance preference. Use up to four times daily as directed. (FOR ICD-10 E11.65) 1 each 0  . calcium acetate  (PHOSLO) 667 MG capsule Take 2 capsules (1,334 mg total) by mouth 3 (three) times daily with meals. 180 capsule 1  . cetirizine (ZYRTEC) 10 MG tablet Take 10 mg by mouth daily.     . diazepam (VALIUM) 5 MG tablet Take 5 mg by mouth 3 (three) times daily as needed for anxiety.     . enoxaparin (LOVENOX) 100 MG/ML injection Inject 1 mL (100 mg total) into the skin daily. At 6am 10 Syringe 0  . ferric citrate (AURYXIA) 1 GM 210 MG(Fe) tablet Take 210 mg by mouth 3 (three) times daily with meals.     Marland Kitchen HYDROcodone-acetaminophen (NORCO/VICODIN) 5-325 MG tablet Take 1 tablet by mouth every 6 (six) hours as needed for moderate pain (Must last 30 days). 112 tablet 0  . Insulin Lispro Prot & Lispro (HUMALOG MIX 75/25 KWIKPEN) (75-25) 100 UNIT/ML Kwikpen INJECT 35 UNITS S.Q. WITH BREAKFAST; 30 UNITS S.Q. WITH SUPPER WHEN PRE-MEAL GLUCOSE IS ABOVE 90 mg/DL. 10 pen 2  . metoprolol tartrate (LOPRESSOR) 50 MG tablet Take 1 tablet (50 mg total) by mouth 2 (two) times daily. 180 tablet 3  . nitroGLYCERIN (NITROSTAT) 0.4 MG SL tablet Place 1 tablet (0.4 mg total) under the tongue every 5 (five) minutes as needed for chest pain. Max three pills 45 tablet 0  . OXYGEN Inhale into the lungs.    . polyethylene glycol (MIRALAX / GLYCOLAX) packet Take 17 g by mouth daily as needed for mild constipation.    . protein supplement (RESOURCE BENEPROTEIN) POWD Take 6 g by mouth 3 (three) times daily with meals.  0  . rOPINIRole (REQUIP) 0.25 MG tablet Take 1 tablet (0.25 mg total) by mouth at bedtime. 30 tablet 3  . sertraline (ZOLOFT) 100 MG tablet Take 200 mg by mouth daily.    . Vitamin D, Ergocalciferol, (DRISDOL) 50000 units CAPS capsule Take 50,000 Units by mouth every Thursday.     . warfarin (COUMADIN) 5 MG tablet Take 7.5 mg by mouth daily on Monday and Thursday. Take 5 mg by mouth daily on all other days 90 tablet 3   No current facility-administered medications for this visit.     ROS:   General:  No weight loss,  Fever, chills  HEENT: No recent headaches, no nasal bleeding, no visual changes, no sore throat  Neurologic: No dizziness, blackouts, seizures. No recent symptoms of stroke or mini- stroke. No recent episodes of slurred speech, or temporary blindness.  Cardiac: No recent episodes of chest pain/pressure, no shortness of breath at rest.  No shortness of breath with exertion.  Denies history of atrial fibrillation or irregular heartbeat  Vascular: No history of rest pain in feet.  No history of claudication.  No history of non-healing ulcer, No history of DVT   Pulmonary: No home oxygen, no productive cough, no hemoptysis,  No asthma or wheezing  Musculoskeletal:  _0  Arthritis, _1  Low back pain,  _2  Joint pain  Hematologic:No history of hypercoagulable state.  No history of easy bleeding.  No history of anemia  Gastrointestinal: No hematochezia or melena,  No gastroesophageal reflux, no trouble swallowing  Urinary: _3  chronic Kidney disease, [x ] on HD - _4  MWF or _5  TTHS, _6  Burning with urination, _7  Frequent urination, _8  Difficulty urinating;   Skin: No rashes  Psychological: No history of anxiety,  No history of depression   Physical Examination  Vitals:   08/05/17 1549  BP: 112/65  Pulse: 79  Resp: 20  Temp: 98.7 F (37.1 C)  TempSrc: Oral  SpO2: (!) 87%  Weight: 216 lb (98 kg)  Height: _9  (1.626 m)    Body mass index is 37.08 kg/m.  General:  Alert and oriented, no acute distress HEENT: Normal Neck: No bruit or JVD Pulmonary: Clear to auscultation bilaterally Cardiac: Regular Rate and Rhythm without murmur Gastrointestinal: Soft, non-tender, non-distended, no mass, no scars Skin: No rash, well healed left neck incision Extremity Pulses:  2+ radial, brachial, femoral, dorsalis pedis, posterior tibial pulses bilaterally Musculoskeletal: No deformity or edema  Neurologic: Upper and lower extremity motor  Grossly intact and equal  DATA:  Carotid  duplex right is patent without re stenosis Left < 39% stenosis   ASSESSMENT:  S/P left CEA   PLAN: She has recovered well from left CEA surgery and has no symptoms of TIA or stroke.  She ambulates with a cane short distances and reports weakness in her LE that is chronic and slowly progressive.    She will f/u with Barnabas Lister NP in 6 months we will schedule a repeat carotid duplex.     Roxy Horseman PA-C [facility]  The patient was seen in conjunction with Dr. Donnetta Hutching today  I have examined the patient, reviewed and agree with above.  The patient does report some hypersensitivity of her left earlobe.  No other issues.  Explained that this may continue to improve but may be permanent.  She has had no neurologic deficits.  Duplex shows no recurrent stenosis.  She will do activities and will be seen again in 6 months with carotid duplex  Curt Jews, MD 08/05/2017 4:29 PM

## 2017-08-20 ENCOUNTER — Other Ambulatory Visit: Payer: Self-pay | Admitting: "Endocrinology

## 2017-08-20 ENCOUNTER — Ambulatory Visit (INDEPENDENT_AMBULATORY_CARE_PROVIDER_SITE_OTHER): Payer: Medicare Other | Admitting: *Deleted

## 2017-08-20 DIAGNOSIS — I4891 Unspecified atrial fibrillation: Secondary | ICD-10-CM

## 2017-08-20 DIAGNOSIS — Z5181 Encounter for therapeutic drug level monitoring: Secondary | ICD-10-CM | POA: Diagnosis not present

## 2017-08-20 LAB — POCT INR: INR: 1.6

## 2017-08-20 NOTE — Patient Instructions (Signed)
Take coumadin 1 1/2 tablets tonight and tomorrow night then resume 1 tablet daily   Recheck INR in 3 week

## 2017-08-27 ENCOUNTER — Ambulatory Visit (INDEPENDENT_AMBULATORY_CARE_PROVIDER_SITE_OTHER): Payer: Medicare Other | Admitting: *Deleted

## 2017-08-27 DIAGNOSIS — I495 Sick sinus syndrome: Secondary | ICD-10-CM | POA: Diagnosis not present

## 2017-08-27 NOTE — Progress Notes (Signed)
Remote pacemaker transmission.   

## 2017-08-28 ENCOUNTER — Encounter: Payer: Self-pay | Admitting: Cardiology

## 2017-09-08 ENCOUNTER — Other Ambulatory Visit: Payer: Self-pay | Admitting: Orthopaedic Surgery

## 2017-09-08 MED ORDER — HYDROCODONE-ACETAMINOPHEN 5-325 MG PO TABS: 1.0000 | ORAL_TABLET | Freq: Four times a day (QID) | ORAL | 0 refills | Status: DC | PRN

## 2017-09-08 NOTE — Telephone Encounter (Signed)
Patient of Dr. Brooke Bonito requests refill on Hydrocodone/Acetaminophen 5-325 mgs.  Qty  112   Sig: Take 1 tablet by mouth every 6 (six) hours as needed for moderate pain (Must last 30 days).  Patient uses [facility]

## 2017-09-09 LAB — CUP PACEART REMOTE DEVICE CHECK
Brady Statistic AP VP Percent: 0.42 %
Brady Statistic AS VP Percent: 0 %
Brady Statistic RA Percent Paced: 63.2 %
Brady Statistic RV Percent Paced: 0.42 %
Date Time Interrogation Session: 20190403051956
Implantable Lead Implant Date: 20180613
Implantable Lead Location: 753860
Implantable Lead Model: 3830
Implantable Lead Model: 5076
Lead Channel Impedance Value: 361 Ohm
Lead Channel Impedance Value: 418 Ohm
Lead Channel Impedance Value: 627 Ohm
Lead Channel Sensing Intrinsic Amplitude: 4.875 mV
Lead Channel Sensing Intrinsic Amplitude: 4.875 mV
MDC IDC LEAD IMPLANT DT: 20180613
MDC IDC LEAD LOCATION: 753859
MDC IDC MSMT BATTERY REMAINING LONGEVITY: 125 mo
MDC IDC MSMT BATTERY VOLTAGE: 3.06 V
MDC IDC MSMT LEADCHNL RA IMPEDANCE VALUE: 456 Ohm
MDC IDC MSMT LEADCHNL RV SENSING INTR AMPL: 12.75 mV
MDC IDC MSMT LEADCHNL RV SENSING INTR AMPL: 12.75 mV
MDC IDC PG IMPLANT DT: 20180613
MDC IDC SET LEADCHNL RA PACING AMPLITUDE: 2.5 V
MDC IDC SET LEADCHNL RV PACING AMPLITUDE: 2.5 V
MDC IDC SET LEADCHNL RV PACING PULSEWIDTH: 0.5 ms
MDC IDC SET LEADCHNL RV SENSING SENSITIVITY: 2.8 mV
MDC IDC STAT BRADY AP VS PERCENT: 63.08 %
MDC IDC STAT BRADY AS VS PERCENT: 36.5 %

## 2017-09-10 ENCOUNTER — Ambulatory Visit (INDEPENDENT_AMBULATORY_CARE_PROVIDER_SITE_OTHER): Payer: Medicare Other | Admitting: *Deleted

## 2017-09-10 DIAGNOSIS — I4891 Unspecified atrial fibrillation: Secondary | ICD-10-CM

## 2017-09-10 DIAGNOSIS — Z5181 Encounter for therapeutic drug level monitoring: Secondary | ICD-10-CM

## 2017-09-10 LAB — POCT INR: INR: 3.8

## 2017-09-10 NOTE — Patient Instructions (Signed)
Hold coumadin tonight then resume 1 tablet daily   Recheck INR in 3 week.

## 2017-09-16 ENCOUNTER — Other Ambulatory Visit: Payer: Self-pay | Admitting: "Endocrinology

## 2017-09-17 ENCOUNTER — Ambulatory Visit: Payer: Medicare Other | Admitting: "Endocrinology

## 2017-09-29 ENCOUNTER — Encounter: Payer: Self-pay | Admitting: Cardiovascular Disease

## 2017-09-29 ENCOUNTER — Ambulatory Visit: Payer: Medicare Other | Admitting: *Deleted

## 2017-09-29 ENCOUNTER — Ambulatory Visit (INDEPENDENT_AMBULATORY_CARE_PROVIDER_SITE_OTHER): Payer: Medicare Other | Admitting: Cardiovascular Disease

## 2017-09-29 VITALS — BP 122/60 | HR 83 | Ht 64.0 in | Wt 226.0 lb

## 2017-09-29 DIAGNOSIS — I5032 Chronic diastolic (congestive) heart failure: Secondary | ICD-10-CM

## 2017-09-29 DIAGNOSIS — E785 Hyperlipidemia, unspecified: Secondary | ICD-10-CM | POA: Diagnosis not present

## 2017-09-29 DIAGNOSIS — Z5181 Encounter for therapeutic drug level monitoring: Secondary | ICD-10-CM

## 2017-09-29 DIAGNOSIS — R5383 Other fatigue: Secondary | ICD-10-CM

## 2017-09-29 DIAGNOSIS — I482 Chronic atrial fibrillation, unspecified: Secondary | ICD-10-CM

## 2017-09-29 DIAGNOSIS — I6523 Occlusion and stenosis of bilateral carotid arteries: Secondary | ICD-10-CM | POA: Diagnosis not present

## 2017-09-29 DIAGNOSIS — I25708 Atherosclerosis of coronary artery bypass graft(s), unspecified, with other forms of angina pectoris: Secondary | ICD-10-CM | POA: Diagnosis not present

## 2017-09-29 DIAGNOSIS — I4891 Unspecified atrial fibrillation: Secondary | ICD-10-CM

## 2017-09-29 DIAGNOSIS — Z950 Presence of cardiac pacemaker: Secondary | ICD-10-CM

## 2017-09-29 DIAGNOSIS — R0609 Other forms of dyspnea: Secondary | ICD-10-CM | POA: Diagnosis not present

## 2017-09-29 DIAGNOSIS — I10 Essential (primary) hypertension: Secondary | ICD-10-CM

## 2017-09-29 LAB — POCT INR: INR: 1.5

## 2017-09-29 NOTE — Patient Instructions (Addendum)
Your physician wants you to follow-up in:3 months with [facility]     Your physician recommends that you continue on your current medications as directed. Please refer to the Current Medication list given to you today.     If you need a refill on your cardiac medications before your next appointment, please call your pharmacy.    Your physician has requested that you have a lexiscan myoview. For further information please visit [email or web address]. Please follow instruction sheet, as given.      No lab work today       Thank you for [place] !

## 2017-09-29 NOTE — Patient Instructions (Signed)
Take coumadin 1 1/2 tablets tonight and tomorrow night then resume 1 tablet daily   Recheck INR in 3 week.

## 2017-09-29 NOTE — Progress Notes (Signed)
SUBJECTIVE: Patient presents for follow-up of coronary artery disease with history of three-vessel CABG in December 2005.  She has been experience exertional dyspnea.  On her last saw her on 06/18/2017, I increased metoprolol to 50 mg twice daily to see if this would improve her symptoms.  She underwent pacemaker placement for symptomatic bradycardia in June 2018. She has permanent atrial fibrillation. She also has a history of hypertension and end-stage renal disease on dialysis.  She tells me that her exertional dyspnea has improved.  However, she continues to feel tired and weak.  She has lower extremity edema and dialyzes tomorrow.  Her blood pressure tends to drop after dialysis and she feels weak.  Nuclear stress test on 10/10/2015 showed mild peri-infarct ischemia involving the anterior wall and anteroseptal wall.  There was evidence for infarcts involving the anterior and lateral walls.  She is here with her daughter.  Social history: Her daughter and son-in-law live close by.  Her granddaughter died of an overdose in 64.    Review of Systems: As per "subjective", otherwise negative.  Allergies  Allergen Reactions  . Tape Other (See Comments)    TAPE WILL CAUSE BLISTERS/SORES; PLEASE USE COBAN WRAP!!  . Gemfibrozil Other (See Comments)    Patient is not aware of this allergy but states that she had side effects to a cholesterol medication in the past  . Oxycodone Other (See Comments)    Hallucinations   . Carbamazepine Other (See Comments)    Reaction to tegretol - "loopy"  . Clonazepam Other (See Comments)    Sleepy   . Macrobid [Nitrofurantoin] Nausea And Vomiting    Current Outpatient Medications  Medication Sig Dispense Refill  . amLODipine (NORVASC) 10 MG tablet Take 10 mg by mouth daily.    Marland Kitchen aspirin EC 81 MG EC tablet Take 1 tablet (81 mg total) by mouth daily. 30 tablet 1  . atorvastatin (LIPITOR) 80 MG tablet Take 1 tablet (80 mg total) by mouth daily.  30 tablet 3  . blood glucose meter kit and supplies KIT Dispense based on patient and insurance preference. Use up to four times daily as directed. (FOR ICD-10 E11.65) 1 each 0  . calcium acetate (PHOSLO) 667 MG capsule Take 2 capsules (1,334 mg total) by mouth 3 (three) times daily with meals. 180 capsule 1  . cetirizine (ZYRTEC) 10 MG tablet Take 10 mg by mouth daily.     . diazepam (VALIUM) 5 MG tablet Take 5 mg by mouth 3 (three) times daily as needed for anxiety.     . enoxaparin (LOVENOX) 100 MG/ML injection Inject 1 mL (100 mg total) into the skin daily. At 6am 10 Syringe 0  . ferric citrate (AURYXIA) 1 GM 210 MG(Fe) tablet Take 210 mg by mouth 3 (three) times daily with meals.     Marland Kitchen HYDROcodone-acetaminophen (NORCO/VICODIN) 5-325 MG tablet Take 1 tablet by mouth every 6 (six) hours as needed for moderate pain (Must last 30 days). 112 tablet 0  . Insulin Lispro Prot & Lispro (HUMALOG MIX 75/25 KWIKPEN) (75-25) 100 UNIT/ML Kwikpen INJECT 35 UNITS S.Q. WITH BREAKFAST; 30 UNITS S.Q. WITH SUPPER WHEN PRE-MEAL GLUCOSE IS ABOVE 90ML/DL. 9 mL 0  . Insulin Lispro Prot & Lispro (HUMALOG MIX 75/25 KWIKPEN) (75-25) 100 UNIT/ML Kwikpen INJECT 35 UNITS S.Q. WITH BREAKFAST; 30 UNITS S.Q. WITH SUPPER WHEN PRE-MEAL GLUCOSE IS ABOVE 90MG/DL. 15 mL 2  . nitroGLYCERIN (NITROSTAT) 0.4 MG SL tablet Place 1 tablet (0.4 mg  total) under the tongue every 5 (five) minutes as needed for chest pain. Max three pills 45 tablet 0  . OXYGEN Inhale into the lungs.    . polyethylene glycol (MIRALAX / GLYCOLAX) packet Take 17 g by mouth daily as needed for mild constipation.    . protein supplement (RESOURCE BENEPROTEIN) POWD Take 6 g by mouth 3 (three) times daily with meals.  0  . rOPINIRole (REQUIP) 0.25 MG tablet Take 1 tablet (0.25 mg total) by mouth at bedtime. 30 tablet 3  . sertraline (ZOLOFT) 100 MG tablet Take 200 mg by mouth daily.    . Vitamin D, Ergocalciferol, (DRISDOL) 50000 units CAPS capsule Take 50,000 Units  by mouth every Thursday.     . warfarin (COUMADIN) 5 MG tablet Take 7.5 mg by mouth daily on Monday and Thursday. Take 5 mg by mouth daily on all other days 90 tablet 3  . metoprolol tartrate (LOPRESSOR) 50 MG tablet Take 1 tablet (50 mg total) by mouth 2 (two) times daily. 180 tablet 3   No current facility-administered medications for this visit.     Past Medical History:  Diagnosis Date  . Adenomatous colon polyp 09/2007  . Arthritis   . Atrial fibrillation with rapid ventricular response ([place]) 10/05/2015  . Bradycardia   . CAD (coronary artery disease)    a. s/p CABG 2005.  . Carotid artery disease ([place])    a. s/p R CEA 2011. Followed by vascular -  duplex 04/16/16: <95% RICA, 09-32% LICA.  Marland Kitchen Chronic anemia   . Chronic diastolic CHF (congestive heart failure) ([place])   . Depression with anxiety   . Diabetes mellitus   . ESRD (end stage renal disease) ([place])    T Th Sat  . Heart murmur   . Hyperlipidemia   . Hypertension   . Neuropathy   . PAD (peripheral artery disease) ([place])   . Paroxysmal atrial flutter ([place])   . Peripheral vascular disease ([place])   . Pulmonary hypertension ([place])   . Rheumatic fever   . Sleep apnea     Past Surgical History:  Procedure Laterality Date  . BASCILIC VEIN TRANSPOSITION Left 12/04/2015   Procedure: BASILIC VEIN TRANSPOSITION;  Surgeon: Rosetta Posner, MD;  Location: [place] [facility];  Service: Vascular;  Laterality: Left;  . CAROTID ENDARTERECTOMY  10/04/2009   Right CEA  . CORONARY ARTERY BYPASS GRAFT  2005  . ENDARTERECTOMY Left 12/18/2016   Procedure: ENDARTERECTOMY CAROTID;  Surgeon: Rosetta Posner, MD;  Location: [place];  Service: Vascular;  Laterality: Left;  . HEMATOMA EVACUATION Left 12/18/2016   Procedure: EVACUATION of left neck HEMATOMA;  Surgeon: Elam Dutch, MD;  Location: [place];  Service: Vascular;  Laterality: Left;  . PACEMAKER IMPLANT N/A 11/06/2016   Procedure: Pacemaker Implant;  Surgeon: Evans Lance, MD;  Location: [place] [facility];  Service: Cardiovascular;  Laterality: N/A;  . PATCH ANGIOPLASTY Left 12/18/2016   Procedure: PATCH ANGIOPLASTY WITH HEMASHIELD PLATIUM FINESSE PATCH;  Surgeon: Rosetta Posner, MD;  Location: [place];  Service: Vascular;  Laterality: Left;  . PERIPHERAL VASCULAR CATHETERIZATION N/A 06/14/2016   Procedure: A/V Fistulagram - Left Upper;  Surgeon: Elam Dutch, MD;  Location: [facility];  Service: Cardiovascular;  Laterality: N/A;  . POLYPECTOMY  02/25/2012   Procedure: POLYPECTOMY;  Surgeon: Danie Binder, MD;  Location: [facility];  Service: Endoscopy;  Laterality: N/A;  . TUBAL LIGATION      Social History  Socioeconomic History  . Marital status: Married    Spouse name: Not on file  . Number of children: Not on file  . Years of education: Not on file  . Highest education level: Not on file  Occupational History  . Not on file  Social Needs  . Financial resource strain: Not on file  . Food insecurity:    Worry: Not on file    Inability: Not on file  . Transportation needs:    Medical: Not on file    Non-medical: Not on file  Tobacco Use  . Smoking status: Never Smoker  . Smokeless tobacco: Never Used  Substance and Sexual Activity  . Alcohol use: No    Alcohol/week: 0.0 oz  . Drug use: No  . Sexual activity: Not on file  Lifestyle  . Physical activity:    Days per week: Not on file    Minutes per session: Not on file  . Stress: Not on file  Relationships  . Social connections:    Talks on phone: Not on file    Gets together: Not on file    Attends religious service: Not on file    Active member of club or organization: Not on file    Attends meetings of clubs or organizations: Not on file    Relationship status: Not on file  . Intimate partner violence:    Fear of current or ex partner: Not on file    Emotionally abused: Not on file    Physically abused: Not on file    Forced sexual activity: Not on file  Other Topics Concern  . Not on file  Social  History Narrative  . Not on file     Vitals:   09/29/17 1303  BP: 122/60  Pulse: 83  SpO2: 99%  Weight: 226 lb (102.5 kg)  Height: '5\' 4"'  (1.626 m)    Wt Readings from Last 3 Encounters:  09/29/17 226 lb (102.5 kg)  08/05/17 216 lb (98 kg)  07/23/17 214 lb (97.1 kg)     PHYSICAL EXAM General: NAD HEENT: Normal. Neck: No JVD, no thyromegaly. Lungs: Clear to auscultation bilaterally with normal respiratory effort. CV: Regular rate and irregular rhythm, normal S1/S2, no S3, no murmur.  1+ pitting bilateral lower extremity edema.   Abdomen: Soft, nontender, no distention.  Neurologic: Alert and oriented.  Psych: Normal affect. Skin: Normal. Musculoskeletal: No gross deformities.    ECG: Most recent ECG reviewed.   Labs: Lab Results  Component Value Date/Time   K 4.2 12/19/2016 04:25 AM   BUN 22 (H) 12/19/2016 04:25 AM   CREATININE 3.19 (H) 12/19/2016 04:25 AM   CREATININE 3.50 (H) 04/25/2016 04:14 PM   ALT 13 (L) 12/16/2016 10:55 AM   TSH 2.454 10/09/2016 07:14 PM   TSH 3.33 04/25/2016 04:14 PM   HGB 10.9 (L) 12/18/2016 06:21 AM     Lipids: Lab Results  Component Value Date/Time   [facility]  04/30/2009 03:23 AM    71        Total Cholesterol/HDL:CHD Risk Coronary Heart Disease Risk Table                     Men   Women  1/2 Average Risk   3.4   3.3  Average Risk       5.0   4.4  2 X Average Risk   9.6   7.1  3 X Average Risk  23.4   11.0  Use the calculated Patient Ratio above and the CHD Risk Table to determine the patient's CHD Risk.        ATP III CLASSIFICATION (LDL):  <100     mg/dL   Optimal  100-129  mg/dL   Near or Above                    Optimal  130-159  mg/dL   Borderline  160-189  mg/dL   High  >190     mg/dL   Very High   CHOL  04/30/2009 03:23 AM    142        ATP III CLASSIFICATION:  <200     mg/dL   Desirable  200-239  mg/dL   Borderline High  >=240    mg/dL   High          TRIG 151 (H) 04/30/2009 03:23 AM   HDL 41  04/30/2009 03:23 AM         ASSESSMENT AND PLAN:  1. CAD with h/o 3-v CABG in 04/2004 with exertional dyspnea and fatigue/weakness: Symptomatically improved with increased dose of metoprolol to 50 mg twice daily with respect to exertional dyspnea.  However, she has had increasing fatigue and weakness.  I will obtain a Lexiscan Myoview to see if ischemia seen in May 2017 has increased in size and severity.  Continue aspirin, metoprolol, and Lipitor. Stress testing in May 2017 showed mild peri-infarct ischemia in the anterior and anteroseptal walls.  2. Essential HTN: Controlled. Blood pressure tends to drop after dialysis.  I told her to hold her amlodipine on dialysis days.  3. Hyperlipidemia: Continue high-dose Lipitor.  4.History of bilateral CEA:She underwent left carotid endarterectomy on 12/18/16.Continue ASA and Lipitor.  5. Chronic diastolic heart failure: Euvolemic. Volume managed by dialysis.  6. Permanent atrial fibrillationand atrial flutter and symptomatic bradycardia s/p pacemaker with exertional dyspnea:Anticoagulated with warfarin.   Continue metoprolol 50 mg twice daily.    Disposition: Follow up 3 months   Kate Sable, M.D., F.A.C.C.

## 2017-10-08 ENCOUNTER — Encounter (HOSPITAL_BASED_OUTPATIENT_CLINIC_OR_DEPARTMENT_OTHER)
Admission: RE | Admit: 2017-10-08 | Discharge: 2017-10-08 | Disposition: A | Discharge status: Home / Self Care | Payer: Medicare Other | Source: Ambulatory Visit | Attending: Cardiovascular Disease | Admitting: Cardiovascular Disease

## 2017-10-08 ENCOUNTER — Encounter (HOSPITAL_COMMUNITY): Payer: Self-pay

## 2017-10-08 ENCOUNTER — Encounter (HOSPITAL_COMMUNITY)
Admission: RE | Admit: 2017-10-08 | Discharge: 2017-10-08 | Disposition: A | Discharge status: Home / Self Care | Payer: Medicare Other | Source: Ambulatory Visit | Attending: Cardiovascular Disease | Admitting: Cardiovascular Disease

## 2017-10-08 DIAGNOSIS — R0609 Other forms of dyspnea: Secondary | ICD-10-CM

## 2017-10-08 LAB — NM MYOCAR MULTI W/SPECT W/WALL MOTION / EF
CHL CUP NUCLEAR SDS: 1
CHL CUP NUCLEAR SRS: 3
CHL CUP NUCLEAR SSS: 4
CSEPPHR: 93 {beats}/min
LV dias vol: 82 mL (ref 46–106)
LVSYSVOL: 45 mL
RATE: 0.46
Rest HR: 72 {beats}/min
TID: 1.05

## 2017-10-08 MED ORDER — SODIUM CHLORIDE 0.9% FLUSH
INTRAVENOUS | Status: AC
  Administered 2017-10-08: 10 mL via INTRAVENOUS
  Filled 2017-10-08: qty 10

## 2017-10-08 MED ORDER — TECHNETIUM TC 99M TETROFOSMIN IV KIT
10.0000 | PACK | Freq: Once | INTRAVENOUS | Status: AC | PRN
  Administered 2017-10-08: 7.14 via INTRAVENOUS

## 2017-10-08 MED ORDER — REGADENOSON 0.4 MG/5ML IV SOLN
INTRAVENOUS | Status: AC
  Administered 2017-10-08: 0.4 mg via INTRAVENOUS
  Filled 2017-10-08: qty 5

## 2017-10-08 MED ORDER — TECHNETIUM TC 99M TETROFOSMIN IV KIT
30.0000 | PACK | Freq: Once | INTRAVENOUS | Status: AC | PRN
Start: 2017-10-08 — End: 2017-10-08
  Administered 2017-10-08: 25.4 via INTRAVENOUS

## 2017-10-09 ENCOUNTER — Telehealth: Payer: Self-pay

## 2017-10-09 DIAGNOSIS — R0609 Other forms of dyspnea: Principal | ICD-10-CM

## 2017-10-09 NOTE — Telephone Encounter (Signed)
-----   Message from Herminio Commons, MD sent at 10/09/2017 11:59 AM EDT ----- No significant blockages. Obtain echocardiogram for better assessment of cardiac function.

## 2017-10-09 NOTE — Telephone Encounter (Signed)
I spoke with pt, daughter to schedule echo apt, pcp copied

## 2017-10-22 ENCOUNTER — Ambulatory Visit (INDEPENDENT_AMBULATORY_CARE_PROVIDER_SITE_OTHER): Payer: Medicare Other | Admitting: Orthopaedic Surgery

## 2017-10-22 ENCOUNTER — Ambulatory Visit (INDEPENDENT_AMBULATORY_CARE_PROVIDER_SITE_OTHER): Payer: Medicare Other | Admitting: *Deleted

## 2017-10-22 ENCOUNTER — Encounter: Payer: Self-pay | Admitting: Orthopaedic Surgery

## 2017-10-22 VITALS — BP 120/66 | HR 94 | Ht 64.0 in | Wt 227.0 lb

## 2017-10-22 DIAGNOSIS — M25572 Pain in left ankle and joints of left foot: Secondary | ICD-10-CM

## 2017-10-22 DIAGNOSIS — I4891 Unspecified atrial fibrillation: Secondary | ICD-10-CM | POA: Diagnosis not present

## 2017-10-22 DIAGNOSIS — G8929 Other chronic pain: Secondary | ICD-10-CM | POA: Diagnosis not present

## 2017-10-22 DIAGNOSIS — E1042 Type 1 diabetes mellitus with diabetic polyneuropathy: Secondary | ICD-10-CM

## 2017-10-22 DIAGNOSIS — Z5181 Encounter for therapeutic drug level monitoring: Secondary | ICD-10-CM

## 2017-10-22 DIAGNOSIS — I6523 Occlusion and stenosis of bilateral carotid arteries: Secondary | ICD-10-CM

## 2017-10-22 LAB — POCT INR: INR: 4.5 — AB (ref 2.0–3.0)

## 2017-10-22 MED ORDER — HYDROCODONE-ACETAMINOPHEN 5-325 MG PO TABS: 1.0000 | ORAL_TABLET | Freq: Four times a day (QID) | ORAL | 0 refills | Status: DC | PRN

## 2017-10-22 NOTE — Patient Instructions (Signed)
Hold coumadin tonight then decrease dose to 1 tablet daily except 1/2 tablet on Sundays  Recheck INR in 2 weeks

## 2017-10-22 NOTE — Progress Notes (Signed)
Patient Briana [name], female DOB:05-03-1946, 72 y.o. RCV:[id number]  Chief Complaint  Patient presents with  . Follow-up    Left ankle pain    HPI  Briana [name] is a 72 y.o. female who has chronic left ankle pain and cramps.  She was in the ICU at [facility] recently for her heart. She has been more active the last few weeks and has more pain now in the left ankle.  She has cramps as well. She uses mustard for this. HPI  Body mass index is 38.96 kg/m.  ROS  Review of Systems  HENT: Negative for congestion.   Respiratory: Negative for cough and shortness of breath.   Cardiovascular: Positive for leg swelling. Negative for chest pain.  Endocrine: Positive for cold intolerance.  Musculoskeletal: Positive for arthralgias, gait problem, joint swelling and myalgias.  Allergic/Immunologic: Positive for environmental allergies.  All other systems reviewed and are negative.   Past Medical History:  Diagnosis Date  . Adenomatous colon polyp 09/2007  . Arthritis   . Atrial fibrillation with rapid ventricular response ([place]) 10/05/2015  . Bradycardia   . CAD (coronary artery disease)    a. s/p CABG 2005.  . Carotid artery disease ([place])    a. s/p R CEA 2011. Followed by vascular -  duplex 04/16/16: <10% RICA, 25-85% LICA.  Marland Kitchen Chronic anemia   . Chronic diastolic CHF (congestive heart failure) ([place])   . Depression with anxiety   . Diabetes mellitus   . ESRD (end stage renal disease) ([place])    T Th Sat  . Heart murmur   . Hyperlipidemia   . Hypertension   . Neuropathy   . PAD (peripheral artery disease) ([place])   . Paroxysmal atrial flutter ([place])   . Peripheral vascular disease ([place])   . Pulmonary hypertension ([place])   . Rheumatic fever   . Sleep apnea     Past Surgical History:  Procedure Laterality Date  . BASCILIC VEIN TRANSPOSITION Left 12/04/2015   Procedure: BASILIC VEIN TRANSPOSITION;  Surgeon: Rosetta Posner, MD;  Location: [place];  Service: Vascular;  Laterality: Left;   . CAROTID ENDARTERECTOMY  10/04/2009   Right CEA  . CORONARY ARTERY BYPASS GRAFT  2005  . ENDARTERECTOMY Left 12/18/2016   Procedure: ENDARTERECTOMY CAROTID;  Surgeon: Rosetta Posner, MD;  Location: [place];  Service: Vascular;  Laterality: Left;  . HEMATOMA EVACUATION Left 12/18/2016   Procedure: EVACUATION of left neck HEMATOMA;  Surgeon: Elam Dutch, MD;  Location: [place];  Service: Vascular;  Laterality: Left;  . PACEMAKER IMPLANT N/A 11/06/2016   Procedure: Pacemaker Implant;  Surgeon: Evans Lance, MD;  Location: [place] [facility];  Service: Cardiovascular;  Laterality: N/A;  . PATCH ANGIOPLASTY Left 12/18/2016   Procedure: PATCH ANGIOPLASTY WITH HEMASHIELD PLATIUM FINESSE PATCH;  Surgeon: Rosetta Posner, MD;  Location: [place];  Service: Vascular;  Laterality: Left;  . PERIPHERAL VASCULAR CATHETERIZATION N/A 06/14/2016   Procedure: A/V Fistulagram - Left Upper;  Surgeon: Elam Dutch, MD;  Location: [place] [facility];  Service: Cardiovascular;  Laterality: N/A;  . POLYPECTOMY  02/25/2012   Procedure: POLYPECTOMY;  Surgeon: Danie Binder, MD;  Location: [facility];  Service: Endoscopy;  Laterality: N/A;  . TUBAL LIGATION      Family History  Problem Relation Age of Onset  . Breast cancer Mother   . Heart disease Father   . Diabetes Father   . Heart disease Brother     Social  History Social History   Tobacco Use  . Smoking status: Never Smoker  . Smokeless tobacco: Never Used  Substance Use Topics  . Alcohol use: No    Alcohol/week: 0.0 oz  . Drug use: No    Allergies  Allergen Reactions  . Tape Other (See Comments)    TAPE WILL CAUSE BLISTERS/SORES; PLEASE USE COBAN WRAP!!  . Gemfibrozil Other (See Comments)    Patient is not aware of this allergy but states that she had side effects to a cholesterol medication in the past  . Oxycodone Other (See Comments)    Hallucinations   . Carbamazepine Other (See Comments)    Reaction to tegretol - "loopy"  . Clonazepam  Other (See Comments)    Sleepy   . Macrobid [Nitrofurantoin] Nausea And Vomiting    Current Outpatient Medications  Medication Sig Dispense Refill  . amLODipine (NORVASC) 10 MG tablet Take 10 mg by mouth daily.    Marland Kitchen aspirin EC 81 MG EC tablet Take 1 tablet (81 mg total) by mouth daily. 30 tablet 1  . atorvastatin (LIPITOR) 80 MG tablet Take 1 tablet (80 mg total) by mouth daily. 30 tablet 3  . blood glucose meter kit and supplies KIT Dispense based on patient and insurance preference. Use up to four times daily as directed. (FOR ICD-10 E11.65) 1 each 0  . calcium acetate (PHOSLO) 667 MG capsule Take 2 capsules (1,334 mg total) by mouth 3 (three) times daily with meals. 180 capsule 1  . cetirizine (ZYRTEC) 10 MG tablet Take 10 mg by mouth daily.     . diazepam (VALIUM) 5 MG tablet Take 5 mg by mouth 3 (three) times daily as needed for anxiety.     . enoxaparin (LOVENOX) 100 MG/ML injection Inject 1 mL (100 mg total) into the skin daily. At 6am 10 Syringe 0  . ferric citrate (AURYXIA) 1 GM 210 MG(Fe) tablet Take 210 mg by mouth 3 (three) times daily with meals.     Marland Kitchen HYDROcodone-acetaminophen (NORCO/VICODIN) 5-325 MG tablet Take 1 tablet by mouth every 6 (six) hours as needed for moderate pain (Must last 30 days). 112 tablet 0  . Insulin Lispro Prot & Lispro (HUMALOG MIX 75/25 KWIKPEN) (75-25) 100 UNIT/ML Kwikpen INJECT 35 UNITS S.Q. WITH BREAKFAST; 30 UNITS S.Q. WITH SUPPER WHEN PRE-MEAL GLUCOSE IS ABOVE 90ML/DL. 9 mL 0  . Insulin Lispro Prot & Lispro (HUMALOG MIX 75/25 KWIKPEN) (75-25) 100 UNIT/ML Kwikpen INJECT 35 UNITS S.Q. WITH BREAKFAST; 30 UNITS S.Q. WITH SUPPER WHEN PRE-MEAL GLUCOSE IS ABOVE 90MG/DL. 15 mL 2  . metoprolol tartrate (LOPRESSOR) 50 MG tablet Take 1 tablet (50 mg total) by mouth 2 (two) times daily. 180 tablet 3  . nitroGLYCERIN (NITROSTAT) 0.4 MG SL tablet Place 1 tablet (0.4 mg total) under the tongue every 5 (five) minutes as needed for chest pain. Max three pills 45 tablet  0  . OXYGEN Inhale into the lungs.    . polyethylene glycol (MIRALAX / GLYCOLAX) packet Take 17 g by mouth daily as needed for mild constipation.    . protein supplement (RESOURCE BENEPROTEIN) POWD Take 6 g by mouth 3 (three) times daily with meals.  0  . rOPINIRole (REQUIP) 0.25 MG tablet Take 1 tablet (0.25 mg total) by mouth at bedtime. 30 tablet 3  . sertraline (ZOLOFT) 100 MG tablet Take 200 mg by mouth daily.    . Vitamin D, Ergocalciferol, (DRISDOL) 50000 units CAPS capsule Take 50,000 Units by mouth every Thursday.     Marland Kitchen  warfarin (COUMADIN) 5 MG tablet Take 7.5 mg by mouth daily on Monday and Thursday. Take 5 mg by mouth daily on all other days 90 tablet 3   No current facility-administered medications for this visit.      Physical Exam  Blood pressure 120/66, pulse 94, height '5\' 4"'  (1.626 m), weight 227 lb (103 kg).  Constitutional: overall normal hygiene, normal nutrition, well developed, normal grooming, normal body habitus. Assistive device:cane  Musculoskeletal: gait and station Limp left, muscle tone and strength are normal, no tremors or atrophy is present.  .  Neurological: coordination overall normal.  Deep tendon reflex/nerve stretch intact.  Sensation normal.  Cranial nerves II-XII intact.   Skin:   Normal overall no scars, lesions, ulcers or rashes. No psoriasis.  Psychiatric: Alert and oriented x 3.  Recent memory intact, remote memory unclear.  Normal mood and affect. Well groomed.  Good eye contact.  Cardiovascular: overall no swelling, no varicosities, no edema bilaterally, normal temperatures of the legs and arms, no clubbing, cyanosis and good capillary refill.  Lymphatic: palpation is normal.  Left ankle has some lateral swelling and diffuse pain.  She has decreased sensation of the foot secondary to neuropathy.  She has limp to the left.  ROM is good.  All other systems reviewed and are negative   The patient has been educated about the nature of the  problem(s) and counseled on treatment options.  The patient appeared to understand what I have discussed and is in agreement with it.  Encounter Diagnoses  Name Primary?  . Chronic pain of left ankle Yes  . Diabetic peripheral neuropathy associated with type 1 diabetes mellitus ([place])     PLAN Call if any problems.  Precautions discussed.  Continue current medications.   Return to clinic 3 months   I have reviewed the [place] web site prior to prescribing narcotic medicine for this patient.  Electronically Signed Sanjuana Kava, MD 5/29/20191:52 PM

## 2017-10-27 ENCOUNTER — Ambulatory Visit (HOSPITAL_COMMUNITY)
Admission: RE | Admit: 2017-10-27 | Discharge: 2017-10-27 | Disposition: A | Discharge status: Home / Self Care | Payer: Medicare Other | Source: Ambulatory Visit | Attending: Cardiovascular Disease | Admitting: Cardiovascular Disease

## 2017-10-27 DIAGNOSIS — I2510 Atherosclerotic heart disease of native coronary artery without angina pectoris: Secondary | ICD-10-CM | POA: Diagnosis not present

## 2017-10-27 DIAGNOSIS — I214 Non-ST elevation (NSTEMI) myocardial infarction: Secondary | ICD-10-CM | POA: Diagnosis not present

## 2017-10-27 DIAGNOSIS — I480 Paroxysmal atrial fibrillation: Secondary | ICD-10-CM | POA: Insufficient documentation

## 2017-10-27 DIAGNOSIS — I272 Pulmonary hypertension, unspecified: Secondary | ICD-10-CM | POA: Insufficient documentation

## 2017-10-27 DIAGNOSIS — I6529 Occlusion and stenosis of unspecified carotid artery: Secondary | ICD-10-CM | POA: Insufficient documentation

## 2017-10-27 DIAGNOSIS — I700 Atherosclerosis of aorta: Secondary | ICD-10-CM | POA: Diagnosis not present

## 2017-10-27 DIAGNOSIS — N186 End stage renal disease: Secondary | ICD-10-CM | POA: Diagnosis not present

## 2017-10-27 DIAGNOSIS — I351 Nonrheumatic aortic (valve) insufficiency: Secondary | ICD-10-CM | POA: Diagnosis not present

## 2017-10-27 DIAGNOSIS — E785 Hyperlipidemia, unspecified: Secondary | ICD-10-CM | POA: Diagnosis not present

## 2017-10-27 DIAGNOSIS — R0609 Other forms of dyspnea: Secondary | ICD-10-CM | POA: Diagnosis present

## 2017-10-27 DIAGNOSIS — Z992 Dependence on renal dialysis: Secondary | ICD-10-CM | POA: Diagnosis not present

## 2017-10-27 NOTE — Progress Notes (Signed)
*  PRELIMINARY RESULTS* Echocardiogram 2D Echocardiogram has been performed.  Briana [name] 10/27/2017, 1:52 PM

## 2017-11-05 ENCOUNTER — Ambulatory Visit (INDEPENDENT_AMBULATORY_CARE_PROVIDER_SITE_OTHER): Payer: Medicare Other | Admitting: *Deleted

## 2017-11-05 DIAGNOSIS — I4891 Unspecified atrial fibrillation: Secondary | ICD-10-CM | POA: Diagnosis not present

## 2017-11-05 DIAGNOSIS — Z5181 Encounter for therapeutic drug level monitoring: Secondary | ICD-10-CM | POA: Diagnosis not present

## 2017-11-05 LAB — POCT INR: INR: 1.4 — AB (ref 2.0–3.0)

## 2017-11-05 NOTE — Patient Instructions (Signed)
Continue 1 tablet daily except 1/2 tablet on Sundays  Recheck INR in 2 weeks  Was off coumadin x 5 days for graft revision (6/5 - 6/9).  Restarted coumadin 6/10.  Has had 2 doses.

## 2017-11-15 ENCOUNTER — Emergency Department (HOSPITAL_COMMUNITY): Payer: Medicare Other

## 2017-11-15 ENCOUNTER — Encounter (HOSPITAL_COMMUNITY): Payer: Self-pay | Admitting: Emergency Medicine

## 2017-11-15 ENCOUNTER — Inpatient Hospital Stay (HOSPITAL_COMMUNITY)
Admission: EM | Admit: 2017-11-15 | Discharge: 2017-12-02 | DRG: 291 | Disposition: A | Discharge status: Hospice | Payer: Medicare Other | Attending: Internal Medicine | Admitting: Internal Medicine

## 2017-11-15 ENCOUNTER — Other Ambulatory Visit: Payer: Self-pay

## 2017-11-15 DIAGNOSIS — R5381 Other malaise: Secondary | ICD-10-CM | POA: Diagnosis present

## 2017-11-15 DIAGNOSIS — I272 Pulmonary hypertension, unspecified: Secondary | ICD-10-CM | POA: Diagnosis present

## 2017-11-15 DIAGNOSIS — G9341 Metabolic encephalopathy: Secondary | ICD-10-CM | POA: Diagnosis present

## 2017-11-15 DIAGNOSIS — K5900 Constipation, unspecified: Secondary | ICD-10-CM | POA: Diagnosis not present

## 2017-11-15 DIAGNOSIS — E1122 Type 2 diabetes mellitus with diabetic chronic kidney disease: Secondary | ICD-10-CM | POA: Diagnosis present

## 2017-11-15 DIAGNOSIS — R410 Disorientation, unspecified: Secondary | ICD-10-CM

## 2017-11-15 DIAGNOSIS — Z09 Encounter for follow-up examination after completed treatment for conditions other than malignant neoplasm: Secondary | ICD-10-CM

## 2017-11-15 DIAGNOSIS — Z9981 Dependence on supplemental oxygen: Secondary | ICD-10-CM | POA: Diagnosis not present

## 2017-11-15 DIAGNOSIS — E1151 Type 2 diabetes mellitus with diabetic peripheral angiopathy without gangrene: Secondary | ICD-10-CM | POA: Diagnosis present

## 2017-11-15 DIAGNOSIS — Y95 Nosocomial condition: Secondary | ICD-10-CM | POA: Diagnosis present

## 2017-11-15 DIAGNOSIS — I5032 Chronic diastolic (congestive) heart failure: Secondary | ICD-10-CM

## 2017-11-15 DIAGNOSIS — F05 Delirium due to known physiological condition: Secondary | ICD-10-CM | POA: Diagnosis present

## 2017-11-15 DIAGNOSIS — Z79899 Other long term (current) drug therapy: Secondary | ICD-10-CM | POA: Diagnosis not present

## 2017-11-15 DIAGNOSIS — F418 Other specified anxiety disorders: Secondary | ICD-10-CM | POA: Diagnosis present

## 2017-11-15 DIAGNOSIS — E1169 Type 2 diabetes mellitus with other specified complication: Secondary | ICD-10-CM | POA: Diagnosis not present

## 2017-11-15 DIAGNOSIS — Z86718 Personal history of other venous thrombosis and embolism: Secondary | ICD-10-CM

## 2017-11-15 DIAGNOSIS — E782 Mixed hyperlipidemia: Secondary | ICD-10-CM | POA: Diagnosis present

## 2017-11-15 DIAGNOSIS — I5033 Acute on chronic diastolic (congestive) heart failure: Secondary | ICD-10-CM | POA: Diagnosis present

## 2017-11-15 DIAGNOSIS — J9621 Acute and chronic respiratory failure with hypoxia: Secondary | ICD-10-CM | POA: Diagnosis present

## 2017-11-15 DIAGNOSIS — F32A Depression, unspecified: Secondary | ICD-10-CM | POA: Diagnosis present

## 2017-11-15 DIAGNOSIS — N186 End stage renal disease: Secondary | ICD-10-CM | POA: Diagnosis present

## 2017-11-15 DIAGNOSIS — R441 Visual hallucinations: Secondary | ICD-10-CM | POA: Diagnosis present

## 2017-11-15 DIAGNOSIS — Z992 Dependence on renal dialysis: Secondary | ICD-10-CM

## 2017-11-15 DIAGNOSIS — R440 Auditory hallucinations: Secondary | ICD-10-CM | POA: Diagnosis not present

## 2017-11-15 DIAGNOSIS — Z888 Allergy status to other drugs, medicaments and biological substances status: Secondary | ICD-10-CM

## 2017-11-15 DIAGNOSIS — Z79891 Long term (current) use of opiate analgesic: Secondary | ICD-10-CM

## 2017-11-15 DIAGNOSIS — I480 Paroxysmal atrial fibrillation: Secondary | ICD-10-CM

## 2017-11-15 DIAGNOSIS — I132 Hypertensive heart and chronic kidney disease with heart failure and with stage 5 chronic kidney disease, or end stage renal disease: Secondary | ICD-10-CM | POA: Diagnosis present

## 2017-11-15 DIAGNOSIS — E8770 Fluid overload, unspecified: Secondary | ICD-10-CM | POA: Diagnosis not present

## 2017-11-15 DIAGNOSIS — I2510 Atherosclerotic heart disease of native coronary artery without angina pectoris: Secondary | ICD-10-CM | POA: Diagnosis present

## 2017-11-15 DIAGNOSIS — Z6836 Body mass index (BMI) 36.0-36.9, adult: Secondary | ICD-10-CM

## 2017-11-15 DIAGNOSIS — G4733 Obstructive sleep apnea (adult) (pediatric): Secondary | ICD-10-CM | POA: Diagnosis present

## 2017-11-15 DIAGNOSIS — E871 Hypo-osmolality and hyponatremia: Secondary | ICD-10-CM | POA: Diagnosis present

## 2017-11-15 DIAGNOSIS — J181 Lobar pneumonia, unspecified organism: Secondary | ICD-10-CM | POA: Diagnosis present

## 2017-11-15 DIAGNOSIS — M549 Dorsalgia, unspecified: Secondary | ICD-10-CM | POA: Diagnosis not present

## 2017-11-15 DIAGNOSIS — D631 Anemia in chronic kidney disease: Secondary | ICD-10-CM | POA: Diagnosis present

## 2017-11-15 DIAGNOSIS — Z794 Long term (current) use of insulin: Secondary | ICD-10-CM

## 2017-11-15 DIAGNOSIS — I4892 Unspecified atrial flutter: Secondary | ICD-10-CM | POA: Diagnosis present

## 2017-11-15 DIAGNOSIS — Z7982 Long term (current) use of aspirin: Secondary | ICD-10-CM

## 2017-11-15 DIAGNOSIS — D696 Thrombocytopenia, unspecified: Secondary | ICD-10-CM | POA: Diagnosis present

## 2017-11-15 DIAGNOSIS — Z95810 Presence of automatic (implantable) cardiac defibrillator: Secondary | ICD-10-CM

## 2017-11-15 DIAGNOSIS — E1140 Type 2 diabetes mellitus with diabetic neuropathy, unspecified: Secondary | ICD-10-CM | POA: Diagnosis present

## 2017-11-15 DIAGNOSIS — Z8673 Personal history of transient ischemic attack (TIA), and cerebral infarction without residual deficits: Secondary | ICD-10-CM | POA: Diagnosis not present

## 2017-11-15 DIAGNOSIS — Z951 Presence of aortocoronary bypass graft: Secondary | ICD-10-CM | POA: Diagnosis not present

## 2017-11-15 DIAGNOSIS — F329 Major depressive disorder, single episode, unspecified: Secondary | ICD-10-CM | POA: Diagnosis present

## 2017-11-15 DIAGNOSIS — Z818 Family history of other mental and behavioral disorders: Secondary | ICD-10-CM | POA: Diagnosis not present

## 2017-11-15 DIAGNOSIS — Z7901 Long term (current) use of anticoagulants: Secondary | ICD-10-CM

## 2017-11-15 DIAGNOSIS — I10 Essential (primary) hypertension: Secondary | ICD-10-CM | POA: Diagnosis not present

## 2017-11-15 DIAGNOSIS — Z66 Do not resuscitate: Secondary | ICD-10-CM | POA: Diagnosis present

## 2017-11-15 DIAGNOSIS — J189 Pneumonia, unspecified organism: Secondary | ICD-10-CM | POA: Diagnosis present

## 2017-11-15 DIAGNOSIS — D638 Anemia in other chronic diseases classified elsewhere: Secondary | ICD-10-CM

## 2017-11-15 DIAGNOSIS — E669 Obesity, unspecified: Secondary | ICD-10-CM | POA: Diagnosis not present

## 2017-11-15 DIAGNOSIS — N2581 Secondary hyperparathyroidism of renal origin: Secondary | ICD-10-CM | POA: Diagnosis present

## 2017-11-15 DIAGNOSIS — Z885 Allergy status to narcotic agent status: Secondary | ICD-10-CM

## 2017-11-15 DIAGNOSIS — Z515 Encounter for palliative care: Secondary | ICD-10-CM | POA: Diagnosis not present

## 2017-11-15 DIAGNOSIS — I252 Old myocardial infarction: Secondary | ICD-10-CM

## 2017-11-15 DIAGNOSIS — R443 Hallucinations, unspecified: Secondary | ICD-10-CM | POA: Diagnosis not present

## 2017-11-15 LAB — COMPREHENSIVE METABOLIC PANEL
ALBUMIN: 3.6 g/dL (ref 3.5–5.0)
ALK PHOS: 117 U/L (ref 38–126)
ALT: 17 U/L (ref 14–54)
AST: 24 U/L (ref 15–41)
Anion gap: 12 (ref 5–15)
BILIRUBIN TOTAL: 1.1 mg/dL (ref 0.3–1.2)
BUN: 23 mg/dL — AB (ref 6–20)
CALCIUM: 8.7 mg/dL — AB (ref 8.9–10.3)
CO2: 29 mmol/L (ref 22–32)
CREATININE: 3.77 mg/dL — AB (ref 0.44–1.00)
Chloride: 92 mmol/L — ABNORMAL LOW (ref 101–111)
GFR calc Af Amer: 13 mL/min — ABNORMAL LOW (ref >60)
GFR calc non Af Amer: 11 mL/min — ABNORMAL LOW (ref >60)
GLUCOSE: 446 mg/dL — AB (ref 65–99)
Potassium: 4.7 mmol/L (ref 3.5–5.1)
Sodium: 133 mmol/L — ABNORMAL LOW (ref 135–145)
Total Protein: 6.9 g/dL (ref 6.5–8.1)

## 2017-11-15 LAB — CBC
HEMATOCRIT: 40.9 % (ref 36.0–46.0)
Hemoglobin: 12.6 g/dL (ref 12.0–15.0)
MCH: 31 pg (ref 26.0–34.0)
MCHC: 30.8 g/dL (ref 30.0–36.0)
MCV: 100.7 fL — AB (ref 78.0–100.0)
Platelets: 115 10*3/uL — ABNORMAL LOW (ref 150–400)
RBC: 4.06 MIL/uL (ref 3.87–5.11)
RDW: 15.8 % — AB (ref 11.5–15.5)
WBC: 6.6 10*3/uL (ref 4.0–10.5)

## 2017-11-15 LAB — I-STAT CG4 LACTIC ACID, ED
Lactic Acid, Venous: 2.18 mmol/L (ref 0.5–1.9)
Lactic Acid, Venous: 1.61 mmol/L (ref 0.5–1.9)

## 2017-11-15 LAB — CBG MONITORING, ED: Glucose-Capillary: 343 mg/dL — ABNORMAL HIGH (ref 65–99)

## 2017-11-15 MED ORDER — SODIUM CHLORIDE 0.9 % IV SOLN
500.0000 mg | Freq: Once | INTRAVENOUS | Status: DC
  Filled 2017-11-15: qty 500

## 2017-11-15 MED ORDER — SODIUM CHLORIDE 0.9 % IV SOLN
1.0000 g | Freq: Once | INTRAVENOUS | Status: AC
  Administered 2017-11-15: 1 g via INTRAVENOUS
  Filled 2017-11-15: qty 10

## 2017-11-15 NOTE — ED Notes (Signed)
Pt unable to make urine

## 2017-11-15 NOTE — ED Notes (Signed)
Pts daughter states pt has reccuring sores on sacrum that her PCP is aware of

## 2017-11-15 NOTE — ED Notes (Signed)
ED Provider at bedside. 

## 2017-11-15 NOTE — ED Notes (Signed)
Patient transported to X-ray 

## 2017-11-15 NOTE — ED Provider Notes (Addendum)
[facility] EMERGENCY DEPARTMENT Provider Note   CSN: [id number] Arrival date & time: 11/15/17  2003     History   Chief Complaint Chief Complaint  Patient presents with  . Hallucinations    HPI Briana [name] is a 72 y.o. female.  HPI   Patient is a 72 year old female presenting with hallucinations.  She is presenting with daughter.  Patient's end-stage renal disease on dialysis, chronic anemia, CHF, diabetes hypertension hyperlipidemia and CAD.  According to daughter patient has been seen things that are not there.  For example she saw her dead grand daughter.  She is seeing rats running across the room, she also sees small children.   Last time patient had this, she had sepsis.  Of note patient is requiring 4 L oxygen here despite full renal  treatment today.  Usuaklly only on 2 L at night.   Past Medical History:  Diagnosis Date  . Adenomatous colon polyp 09/2007  . Arthritis   . Atrial fibrillation with rapid ventricular response ([place]) 10/05/2015  . Bradycardia   . CAD (coronary artery disease)    a. s/p CABG 2005.  . Carotid artery disease ([place])    a. s/p R CEA 2011. Followed by vascular -  duplex 04/16/16: <08% RICA, 14-48% LICA.  Marland Kitchen Chronic anemia   . Chronic diastolic CHF (congestive heart failure) ([place])   . Depression with anxiety   . Diabetes mellitus   . ESRD (end stage renal disease) ([place])    T Th Sat  . Heart murmur   . Hyperlipidemia   . Hypertension   . Neuropathy   . PAD (peripheral artery disease) ([place])   . Paroxysmal atrial flutter ([place])   . Peripheral vascular disease ([place])   . Pulmonary hypertension ([place])   . Rheumatic fever   . Sleep apnea     Patient Active Problem List   Diagnosis Date Noted  . Encounter for therapeutic drug monitoring 02/05/2017  . Asymptomatic stenosis of left carotid artery 12/18/2016  . Symptomatic bradycardia 11/05/2016  . Tachycardia-bradycardia syndrome ([place]) 10/15/2016  . Paroxysmal  atrial flutter ([place]) 10/15/2016  . Bradycardia 10/09/2016  . Personal history of noncompliance with medical treatment, presenting hazards to health 09/17/2016  . Essential hypertension, benign   . Anemia of chronic kidney failure   . Chronic diastolic heart failure ([place]) 11/10/2015  . Depression   . Hypoalbuminemia due to protein-calorie malnutrition ([place])   . Debility 10/18/2015  . Abnormality of gait   . Long term current use of anticoagulant therapy   . Chronic pain syndrome   . Hx of CABG 2005   . Hx of non-ST elevation myocardial infarction (NSTEMI)   . Paroxysmal atrial fibrillation (HCC)   . Diabetes mellitus with end-stage renal disease ([place])   . ESRD (end stage renal disease) on dialysis ([place])   . AKI (acute kidney injury) ([place])   . Non-ST elevation myocardial infarction (NSTEMI) ([place]) 10/14/2015  . Pulmonary edema   . Abnormal nuclear stress test   . Acute renal failure with tubular necrosis ([place])   . Hypertensive emergency   . CAP (community acquired pneumonia)   . CAD in native artery   . Pulmonary hypertension ([place])   . Atrial fibrillation with RVR ([place])   . Acute on chronic renal failure ([place])   . Physical deconditioning   . Acute respiratory failure with hypoxia ([place])   . Acute pulmonary edema (HCC)   . Acute blood loss anemia   .  Acute respiratory failure ([place]) 10/01/2015  . Hypoxia 10/01/2015  . Elevated troponin 10/01/2015  . Chest pain   . DVT (deep venous thrombosis) ([place])   . Hypoxemia   . Mixed hyperlipidemia 03/28/2015  . Vitamin D deficiency 03/28/2015  . Acute renal failure ([place]) 03/04/2014  . UTI (lower urinary tract infection) 03/02/2014  . Acute encephalopathy 03/02/2014  . Dehydration 03/02/2014  . Generalized weakness 03/02/2014  . Obesity 03/02/2014  . Depressed mood 03/02/2014  . Hx of adenomatous colonic polyps 01/28/2012  . Occlusion and stenosis of carotid artery without mention of cerebral infarction 12/03/2011    Past Surgical  History:  Procedure Laterality Date  . BASCILIC VEIN TRANSPOSITION Left 12/04/2015   Procedure: BASILIC VEIN TRANSPOSITION;  Surgeon: Rosetta Posner, MD;  Location: [facility];  Service: Vascular;  Laterality: Left;  . CAROTID ENDARTERECTOMY  10/04/2009   Right CEA  . CORONARY ARTERY BYPASS GRAFT  2005  . ENDARTERECTOMY Left 12/18/2016   Procedure: ENDARTERECTOMY CAROTID;  Surgeon: Rosetta Posner, MD;  Location: [facility];  Service: Vascular;  Laterality: Left;  . HEMATOMA EVACUATION Left 12/18/2016   Procedure: EVACUATION of left neck HEMATOMA;  Surgeon: Elam Dutch, MD;  Location: [facility];  Service: Vascular;  Laterality: Left;  . PACEMAKER IMPLANT N/A 11/06/2016   Procedure: Pacemaker Implant;  Surgeon: Evans Lance, MD;  Location: [facility];  Service: Cardiovascular;  Laterality: N/A;  . PATCH ANGIOPLASTY Left 12/18/2016   Procedure: PATCH ANGIOPLASTY WITH HEMASHIELD PLATIUM FINESSE PATCH;  Surgeon: Rosetta Posner, MD;  Location: [facility];  Service: Vascular;  Laterality: Left;  . PERIPHERAL VASCULAR CATHETERIZATION N/A 06/14/2016   Procedure: A/V Fistulagram - Left Upper;  Surgeon: Elam Dutch, MD;  Location: [facility];  Service: Cardiovascular;  Laterality: N/A;  . POLYPECTOMY  02/25/2012   Procedure: POLYPECTOMY;  Surgeon: Danie Binder, MD;  Location: [facility];  Service: Endoscopy;  Laterality: N/A;  . TUBAL LIGATION       OB History   None      Home Medications    Prior to Admission medications   Medication Sig Start Date End Date Taking? Authorizing Provider  amLODipine (NORVASC) 10 MG tablet Take 10 mg by mouth daily.    [provider]  aspirin EC 81 MG EC tablet Take 1 tablet (81 mg total) by mouth daily. 10/18/15   Reyne Dumas, MD  atorvastatin (LIPITOR) 80 MG tablet Take 1 tablet (80 mg total) by mouth daily. 12/10/15   Rama, Venetia Maxon, MD  blood glucose meter kit and supplies KIT Dispense based on patient and insurance preference. Use up to four  times daily as directed. (FOR ICD-10 E11.65) 09/18/16   Cassandria Anger, MD  calcium acetate (PHOSLO) 667 MG capsule Take 2 capsules (1,334 mg total) by mouth 3 (three) times daily with meals. 10/24/15   Love, Ivan Anchors, PA-C  cetirizine (ZYRTEC) 10 MG tablet Take 10 mg by mouth daily.     [provider]  diazepam (VALIUM) 5 MG tablet Take 5 mg by mouth 3 (three) times daily as needed for anxiety.     [provider]  ferric citrate (AURYXIA) 1 GM 210 MG(Fe) tablet Take 210 mg by mouth 3 (three) times daily with meals.     [provider]  HYDROcodone-acetaminophen (NORCO/VICODIN) 5-325 MG tablet Take 1 tablet by mouth every 6 (six) hours as needed for moderate pain (Must last 30 days). 10/22/17   Sanjuana Kava,  MD  Insulin Lispro Prot & Lispro (HUMALOG MIX 75/25 KWIKPEN) (75-25) 100 UNIT/ML Kwikpen INJECT 35 UNITS S.Q. WITH BREAKFAST; 30 UNITS S.Q. WITH SUPPER WHEN PRE-MEAL GLUCOSE IS ABOVE 90ML/DL. 08/22/17   Nida, Marella Chimes, MD  Insulin Lispro Prot & Lispro (HUMALOG MIX 75/25 KWIKPEN) (75-25) 100 UNIT/ML Kwikpen INJECT 35 UNITS S.Q. WITH BREAKFAST; 30 UNITS S.Q. WITH SUPPER WHEN PRE-MEAL GLUCOSE IS ABOVE 90MG/DL. 09/17/17   Cassandria Anger, MD  metoprolol tartrate (LOPRESSOR) 50 MG tablet Take 1 tablet (50 mg total) by mouth 2 (two) times daily. 06/18/17 09/16/17  Herminio Commons, MD  nitroGLYCERIN (NITROSTAT) 0.4 MG SL tablet Place 1 tablet (0.4 mg total) under the tongue every 5 (five) minutes as needed for chest pain. Max three pills 10/24/15   Love, Ivan Anchors, PA-C  OXYGEN Inhale into the lungs.    [provider]  polyethylene glycol (MIRALAX / GLYCOLAX) packet Take 17 g by mouth daily as needed for mild constipation. 12/20/16   Alvia Grove, PA-C  protein supplement (RESOURCE BENEPROTEIN) POWD Take 6 g by mouth 3 (three) times daily with meals. 10/24/15   Love, Ivan Anchors, PA-C  rOPINIRole (REQUIP) 0.25 MG tablet Take 1 tablet (0.25 mg total)  by mouth at bedtime. 12/10/15   Rama, Venetia Maxon, MD  sertraline (ZOLOFT) 100 MG tablet Take 200 mg by mouth daily. 05/07/16   [provider]  Vitamin D, Ergocalciferol, (DRISDOL) 50000 units CAPS capsule Take 50,000 Units by mouth every Thursday.     [provider]  warfarin (COUMADIN) 5 MG tablet Take 7.5 mg by mouth daily on Monday and Thursday. Take 5 mg by mouth daily on all other days 12/20/16   Alvia Grove, PA-C    Family History Family History  Problem Relation Age of Onset  . Breast cancer Mother   . Heart disease Father   . Diabetes Father   . Heart disease Brother     Social History Social History   Tobacco Use  . Smoking status: Never Smoker  . Smokeless tobacco: Never Used  Substance Use Topics  . Alcohol use: No    Alcohol/week: 0.0 oz  . Drug use: No     Allergies   Tape; Gemfibrozil; Oxycodone; Carbamazepine; Clonazepam; and Macrobid [nitrofurantoin]   Review of Systems Review of Systems  Constitutional: Positive for fatigue. Negative for activity change and fever.  Respiratory: Positive for shortness of breath.   Cardiovascular: Negative for chest pain.  Gastrointestinal: Negative for abdominal pain.  Psychiatric/Behavioral: Positive for hallucinations.  All other systems reviewed and are negative.    Physical Exam Updated Vital Signs BP 136/76   Pulse 60   Temp 98 F (36.7 C) (Oral)   Resp 14   Ht '5\' 4"'  (1.626 m)   Wt 95.3 kg (210 lb)   SpO2 92%   BMI 36.05 kg/m   Physical Exam  Constitutional: She is oriented to person, place, and time. She appears well-developed and well-nourished.  HENT:  Head: Normocephalic and atraumatic.  Eyes: Right eye exhibits no discharge. Left eye exhibits no discharge.  Cardiovascular: Normal rate and regular rhythm.  Pulmonary/Chest: Breath sounds normal. No respiratory distress.  Mild tachypnea, coarse breath sounds, requiring 02  Abdominal: Soft. Bowel sounds are normal. She  exhibits no distension.  Genitourinary:  Genitourinary Comments: Sores, well healing, no erythema.   Neurological: She is oriented to person, place, and time.  Skin: Skin is warm and dry. She is not diaphoretic.  Psychiatric: She  has a normal mood and affect.  Nursing note and vitals reviewed.    ED Treatments / Results  Labs (all labs ordered are listed, but only abnormal results are displayed) Labs Reviewed  CBC - Abnormal; Notable for the following components:      Result Value   MCV 100.7 (*)    RDW 15.8 (*)    Platelets 115 (*)    All other components within normal limits  COMPREHENSIVE METABOLIC PANEL - Abnormal; Notable for the following components:   Sodium 133 (*)    Chloride 92 (*)    Glucose, Bld 446 (*)    BUN 23 (*)    Creatinine, Ser 3.77 (*)    Calcium 8.7 (*)    GFR calc non Af Amer 11 (*)    GFR calc Af Amer 13 (*)    All other components within normal limits  CBG MONITORING, ED - Abnormal; Notable for the following components:   Glucose-Capillary 343 (*)    All other components within normal limits  I-STAT CG4 LACTIC ACID, ED - Abnormal; Notable for the following components:   Lactic Acid, Venous 2.18 (*)    All other components within normal limits  URINALYSIS, ROUTINE W REFLEX MICROSCOPIC  I-STAT CG4 LACTIC ACID, ED    EKG EKG Interpretation  Date/Time:  Saturday November 15 2017 20:29:37 EDT Ventricular Rate:  61 PR Interval:    QRS Duration: 170 QT Interval:  510 QTC Calculation: 513 R Axis:   16 Text Interpretation:  Ventricular-paced rhythm Abnormal ECG vpaced Confirmed by Zenovia Jarred [phone]) on 11/15/2017 10:44:15 PM   Radiology Dg Chest 2 View  Result Date: 11/15/2017 CLINICAL DATA:  Visual hallucinations for several days. EXAM: CHEST - 2 VIEW COMPARISON:  11/07/2016. FINDINGS: Progressive enlargement of the cardiac silhouette. Increased prominence of the pulmonary vasculature and interstitial markings. No pleural fluid. Interval  patchy and linear density at the left lung base. Stable post CABG changes and right subclavian pacemaker leads. Thoracic spine degenerative changes. IMPRESSION: 1. Progressive cardiomegaly with progressive mild changes of congestive heart failure. 2. Interval left basilar atelectasis and possible pneumonia. Electronically Signed   By: Claudie Revering M.D.   On: 11/15/2017 22:36    Procedures Procedures (including critical care time)  Medications Ordered in ED Medications  cefTRIAXone (ROCEPHIN) 1 g in sodium chloride 0.9 % 100 mL IVPB (has no administration in time range)  azithromycin (ZITHROMAX) 500 mg in sodium chloride 0.9 % 250 mL IVPB (has no administration in time range)     Initial Impression / Assessment and Plan / ED Course  I have reviewed the triage vital signs and the nursing notes.  Pertinent labs & imaging results that were available during my care of the patient were reviewed by me and considered in my medical decision making (see chart for details).     Patient is a 72 year old female presenting with hallucinations.  She is presenting with daughter.  Patient's end-stage renal disease on dialysis, chronic anemia, CHF, diabetes hypertension hyperlipidemia and CAD.  According to daughter patient has been seen things that are not there.  For example she saw her dead grand daughter.  She is seeing rats running across the room, she also sees small children.  Family reports last time this happened she was septic.   Of note patient is requiring 4 L oxygen here despite full renal  treatment today.  Usuaklly only on 2 L at night.   EF of 60% on most recent echo.  10:43 PM Patient has evidence of what looks like a developing pneumonia in the left lower lobe.  Will treat for community-acquired pneumonia and admit to given increasing hallucinations at home and increased oxygen requirement.  Final Clinical Impressions(s) / ED Diagnoses   Final diagnoses:  None    ED  Discharge Orders    None       Macarthur Critchley, MD 11/15/17 2243    Macarthur Critchley, MD 11/15/17 2246    Macarthur Critchley, MD 11/15/17 2311

## 2017-11-15 NOTE — ED Triage Notes (Signed)
Pt presents with reports of visual hallucinations for several days. Pt states she is seeing rats and a puppy in her home that she knows are not there.  Pt denies AH. Initial sat 82%, pt states she wears O2 at night and during HD. T,TH,S. Pt placed on [place] sat up to 92%.  Full treatment today.denies pain.

## 2017-11-16 DIAGNOSIS — E8770 Fluid overload, unspecified: Secondary | ICD-10-CM

## 2017-11-16 LAB — CBC
HEMATOCRIT: 39.8 % (ref 36.0–46.0)
Hemoglobin: 12.1 g/dL (ref 12.0–15.0)
MCH: 30.3 pg (ref 26.0–34.0)
MCHC: 30.4 g/dL (ref 30.0–36.0)
MCV: 99.7 fL (ref 78.0–100.0)
Platelets: 133 10*3/uL — ABNORMAL LOW (ref 150–400)
RBC: 3.99 MIL/uL (ref 3.87–5.11)
RDW: 15.6 % — AB (ref 11.5–15.5)
WBC: 6.3 10*3/uL (ref 4.0–10.5)

## 2017-11-16 LAB — PROTIME-INR
INR: 1.92
PROTHROMBIN TIME: 21.8 s — AB (ref 11.4–15.2)

## 2017-11-16 LAB — COMPREHENSIVE METABOLIC PANEL
ALBUMIN: 3.4 g/dL — AB (ref 3.5–5.0)
ALT: 18 U/L (ref 14–54)
ANION GAP: 10 (ref 5–15)
AST: 22 U/L (ref 15–41)
Alkaline Phosphatase: 121 U/L (ref 38–126)
BILIRUBIN TOTAL: 1.2 mg/dL (ref 0.3–1.2)
BUN: 24 mg/dL — ABNORMAL HIGH (ref 6–20)
CO2: 32 mmol/L (ref 22–32)
Calcium: 8.8 mg/dL — ABNORMAL LOW (ref 8.9–10.3)
Chloride: 96 mmol/L — ABNORMAL LOW (ref 101–111)
Creatinine, Ser: 3.98 mg/dL — ABNORMAL HIGH (ref 0.44–1.00)
GFR calc Af Amer: 12 mL/min — ABNORMAL LOW (ref >60)
GFR calc non Af Amer: 10 mL/min — ABNORMAL LOW (ref >60)
Glucose, Bld: 246 mg/dL — ABNORMAL HIGH (ref 65–99)
POTASSIUM: 4.3 mmol/L (ref 3.5–5.1)
Sodium: 138 mmol/L (ref 135–145)
TOTAL PROTEIN: 6.8 g/dL (ref 6.5–8.1)

## 2017-11-16 LAB — PROCALCITONIN
PROCALCITONIN: 0.18 ng/mL
PROCALCITONIN: 0.2 ng/mL

## 2017-11-16 LAB — GLUCOSE, CAPILLARY
Glucose-Capillary: 239 mg/dL — ABNORMAL HIGH (ref 65–99)
Glucose-Capillary: 290 mg/dL — ABNORMAL HIGH (ref 65–99)
Glucose-Capillary: 263 mg/dL — ABNORMAL HIGH (ref 65–99)
Glucose-Capillary: 252 mg/dL — ABNORMAL HIGH (ref 65–99)

## 2017-11-16 LAB — HEMOGLOBIN A1C
HEMOGLOBIN A1C: 9.6 % — AB (ref 4.8–5.6)
MEAN PLASMA GLUCOSE: 228.82 mg/dL

## 2017-11-16 LAB — MRSA PCR SCREENING: MRSA by PCR: NEGATIVE

## 2017-11-16 MED ORDER — ALTEPLASE 2 MG IJ SOLR: 2.0000 mg | Freq: Once | INTRAMUSCULAR | Status: DC | PRN

## 2017-11-16 MED ORDER — VANCOMYCIN HCL IN DEXTROSE 750-5 MG/150ML-% IV SOLN: 750.0000 mg | INTRAVENOUS | Status: DC

## 2017-11-16 MED ORDER — IPRATROPIUM-ALBUTEROL 0.5-2.5 (3) MG/3ML IN SOLN: 3.0000 mL | RESPIRATORY_TRACT | Status: DC | PRN

## 2017-11-16 MED ORDER — CHLORHEXIDINE GLUCONATE CLOTH 2 % EX PADS
6.0000 | MEDICATED_PAD | Freq: Every day | CUTANEOUS | Status: DC
  Administered 2017-11-16 – 2017-11-22 (×7): 6 via TOPICAL

## 2017-11-16 MED ORDER — SODIUM CHLORIDE 0.9 % IV SOLN
INTRAVENOUS | Status: DC | PRN
  Administered 2017-11-16 (×2): via INTRAVENOUS

## 2017-11-16 MED ORDER — LIDOCAINE HCL (PF) 1 % IJ SOLN: 5.0000 mL | INTRAMUSCULAR | Status: DC | PRN

## 2017-11-16 MED ORDER — ASPIRIN EC 81 MG PO TBEC
81.0000 mg | DELAYED_RELEASE_TABLET | Freq: Every day | ORAL | Status: DC
  Administered 2017-11-16 – 2017-11-22 (×6): 81 mg via ORAL
  Filled 2017-11-16 (×6): qty 1

## 2017-11-16 MED ORDER — PENTAFLUOROPROP-TETRAFLUOROETH EX AERO: 1.0000 "application " | INHALATION_SPRAY | CUTANEOUS | Status: DC | PRN

## 2017-11-16 MED ORDER — ROPINIROLE HCL 0.25 MG PO TABS
0.2500 mg | ORAL_TABLET | Freq: Every day | ORAL | Status: DC
  Administered 2017-11-16 – 2017-11-30 (×14): 0.25 mg via ORAL
  Filled 2017-11-16 (×17): qty 1

## 2017-11-16 MED ORDER — METOPROLOL TARTRATE 50 MG PO TABS
50.0000 mg | ORAL_TABLET | Freq: Two times a day (BID) | ORAL | Status: DC
  Administered 2017-11-17 – 2017-11-18 (×2): 50 mg via ORAL
  Filled 2017-11-16 (×2): qty 1

## 2017-11-16 MED ORDER — SODIUM CHLORIDE 0.9 % IV SOLN
1500.0000 mg | Freq: Once | INTRAVENOUS | Status: AC
  Administered 2017-11-16: 1500 mg via INTRAVENOUS
  Filled 2017-11-16 (×2): qty 1500

## 2017-11-16 MED ORDER — DIAZEPAM 5 MG PO TABS
5.0000 mg | ORAL_TABLET | Freq: Three times a day (TID) | ORAL | Status: DC | PRN
  Administered 2017-11-16 – 2017-11-28 (×10): 5 mg via ORAL
  Filled 2017-11-16 (×10): qty 1

## 2017-11-16 MED ORDER — PIPERACILLIN-TAZOBACTAM 3.375 G IVPB
3.3750 g | Freq: Two times a day (BID) | INTRAVENOUS | Status: DC
  Administered 2017-11-16 – 2017-11-19 (×7): 3.375 g via INTRAVENOUS
  Filled 2017-11-16 (×10): qty 50

## 2017-11-16 MED ORDER — SERTRALINE HCL 100 MG PO TABS
200.0000 mg | ORAL_TABLET | Freq: Every day | ORAL | Status: DC
  Administered 2017-11-16 – 2017-12-02 (×15): 200 mg via ORAL
  Filled 2017-11-16 (×15): qty 2

## 2017-11-16 MED ORDER — HEPARIN SODIUM (PORCINE) 1000 UNIT/ML DIALYSIS
1000.0000 [IU] | INTRAMUSCULAR | Status: DC | PRN
  Filled 2017-11-16: qty 1

## 2017-11-16 MED ORDER — ATORVASTATIN CALCIUM 80 MG PO TABS
80.0000 mg | ORAL_TABLET | Freq: Every day | ORAL | Status: DC
  Administered 2017-11-16 – 2017-11-22 (×6): 80 mg via ORAL
  Filled 2017-11-16 (×6): qty 1

## 2017-11-16 MED ORDER — WARFARIN SODIUM 2.5 MG PO TABS
2.5000 mg | ORAL_TABLET | Freq: Once | ORAL | Status: AC
  Administered 2017-11-16: 2.5 mg via ORAL
  Filled 2017-11-16: qty 1

## 2017-11-16 MED ORDER — SODIUM CHLORIDE 0.9 % IV SOLN: 100.0000 mL | INTRAVENOUS | Status: DC | PRN

## 2017-11-16 MED ORDER — LIDOCAINE-PRILOCAINE 2.5-2.5 % EX CREA
1.0000 "application " | TOPICAL_CREAM | CUTANEOUS | Status: DC | PRN
  Filled 2017-11-16: qty 5

## 2017-11-16 MED ORDER — FERRIC CITRATE 1 GM 210 MG(FE) PO TABS
210.0000 mg | ORAL_TABLET | Freq: Three times a day (TID) | ORAL | Status: DC
  Administered 2017-11-16 – 2017-11-22 (×16): 210 mg via ORAL
  Filled 2017-11-16 (×21): qty 1

## 2017-11-16 MED ORDER — WARFARIN - PHARMACIST DOSING INPATIENT
Freq: Every day | Status: DC
  Administered 2017-11-20 – 2017-11-21 (×2)

## 2017-11-16 MED ORDER — HYDROCODONE-ACETAMINOPHEN 5-325 MG PO TABS
1.0000 | ORAL_TABLET | Freq: Four times a day (QID) | ORAL | Status: DC | PRN
  Administered 2017-11-16 – 2017-12-01 (×10): 1 via ORAL
  Filled 2017-11-16 (×10): qty 1

## 2017-11-16 MED ORDER — VANCOMYCIN HCL IN DEXTROSE 750-5 MG/150ML-% IV SOLN
750.0000 mg | INTRAVENOUS | Status: AC
  Administered 2017-11-17: 750 mg via INTRAVENOUS

## 2017-11-16 MED ORDER — IPRATROPIUM-ALBUTEROL 0.5-2.5 (3) MG/3ML IN SOLN: 3.0000 mL | Freq: Four times a day (QID) | RESPIRATORY_TRACT | Status: DC

## 2017-11-16 MED ORDER — WARFARIN SODIUM 5 MG PO TABS: 5.0000 mg | ORAL_TABLET | Freq: Once | ORAL | Status: DC

## 2017-11-16 MED ORDER — INSULIN ASPART 100 UNIT/ML ~~LOC~~ SOLN
0.0000 [IU] | Freq: Three times a day (TID) | SUBCUTANEOUS | Status: DC
  Administered 2017-11-16: 5 [IU] via SUBCUTANEOUS
  Administered 2017-11-16: 3 [IU] via SUBCUTANEOUS
  Administered 2017-11-16 – 2017-11-17 (×2): 5 [IU] via SUBCUTANEOUS
  Administered 2017-11-17: 2 [IU] via SUBCUTANEOUS
  Administered 2017-11-17: 5 [IU] via SUBCUTANEOUS
  Administered 2017-11-18: 2 [IU] via SUBCUTANEOUS
  Administered 2017-11-18: 5 [IU] via SUBCUTANEOUS
  Administered 2017-11-21 (×2): 3 [IU] via SUBCUTANEOUS
  Administered 2017-11-22: 1 [IU] via SUBCUTANEOUS

## 2017-11-16 NOTE — H&P (Signed)
History and Physical  Briana [name] GGE:[id number] DOB: Mar 14, 1946 DOA: 11/15/2017  Referring physician: Dr Thomasene Lot  PCP: Vidal Schwalbe, MD  Outpatient Specialists: Nephrology Patient coming from: Home  Chief Complaint: Visual hallucinations  HPI: MARELLY [name] is a 72 y.o. female with medical history significant for end-stage renal disease on dialysis Tuesday Thursday Saturday, nocturnal hypoxia on 2 L of oxygen at night, coronary artery disease status post CABG, paroxysmal A. fib on Coumadin, post AICD placement, chronic diastolic CHF, pulmonary hypertension, who presented to ED [facility] from home accompanied by her daughter due to persistent visual hallucinations.  Onset today.  CT head no contrast done in the ED revealed possible old infarct in the region of the right posterior occipital lobe.  At the time of this evaluation patient was alert and oriented x3 and stated that she was no longer having hallucinations.  Self-reported completion of her hemodialysis today.   ED Course: Upon presentation to the ED acute on chronic hypoxia noted.  Chest x-ray on presentation revealed left lower lobe infiltrates versus atelectasis and increase in pulmonary vascularity with suspicion for fluid overload.  Started on IV antibiotics in the ED.  Lab studies remarkable for hyponatremia with sodium of 133 and elevated lactic acid at 2.18.  Review of Systems: Review of systems as stated in the HPI.  All other systems reviewed and are negative.   Past Medical History:  Diagnosis Date  . Adenomatous colon polyp 09/2007  . Arthritis   . Atrial fibrillation with rapid ventricular response ([place]) 10/05/2015  . Bradycardia   . CAD (coronary artery disease)    a. s/p CABG 2005.  . Carotid artery disease ([place])    a. s/p R CEA 2011. Followed by vascular -  duplex 04/16/16: <46% RICA, 50-35% LICA.  Marland Kitchen Chronic anemia   . Chronic diastolic CHF (congestive heart failure) ([place])   . Depression with anxiety   .  Diabetes mellitus   . ESRD (end stage renal disease) ([place])    T Th Sat  . Heart murmur   . Hyperlipidemia   . Hypertension   . Neuropathy   . PAD (peripheral artery disease) ([place])   . Paroxysmal atrial flutter ([place])   . Peripheral vascular disease ([place])   . Pulmonary hypertension ([place])   . Rheumatic fever   . Sleep apnea    Past Surgical History:  Procedure Laterality Date  . BASCILIC VEIN TRANSPOSITION Left 12/04/2015   Procedure: BASILIC VEIN TRANSPOSITION;  Surgeon: Rosetta Posner, MD;  Location: [place];  Service: Vascular;  Laterality: Left;  . CAROTID ENDARTERECTOMY  10/04/2009   Right CEA  . CORONARY ARTERY BYPASS GRAFT  2005  . ENDARTERECTOMY Left 12/18/2016   Procedure: ENDARTERECTOMY CAROTID;  Surgeon: Rosetta Posner, MD;  Location: [place];  Service: Vascular;  Laterality: Left;  . HEMATOMA EVACUATION Left 12/18/2016   Procedure: EVACUATION of left neck HEMATOMA;  Surgeon: Elam Dutch, MD;  Location: [place];  Service: Vascular;  Laterality: Left;  . PACEMAKER IMPLANT N/A 11/06/2016   Procedure: Pacemaker Implant;  Surgeon: Evans Lance, MD;  Location: [place] [facility];  Service: Cardiovascular;  Laterality: N/A;  . PATCH ANGIOPLASTY Left 12/18/2016   Procedure: PATCH ANGIOPLASTY WITH HEMASHIELD PLATIUM FINESSE PATCH;  Surgeon: Rosetta Posner, MD;  Location: [place];  Service: Vascular;  Laterality: Left;  . PERIPHERAL VASCULAR CATHETERIZATION N/A 06/14/2016   Procedure: A/V Fistulagram - Left Upper;  Surgeon: Elam Dutch, MD;  Location: [place]  [facility];  Service: Cardiovascular;  Laterality: N/A;  . POLYPECTOMY  02/25/2012   Procedure: POLYPECTOMY;  Surgeon: Danie Binder, MD;  Location: [facility];  Service: Endoscopy;  Laterality: N/A;  . TUBAL LIGATION      Social History:  reports that she has never smoked. She has never used smokeless tobacco. She reports that she does not drink alcohol or use drugs.   Allergies  Allergen Reactions  . Tape Other (See Comments)     TAPE WILL CAUSE BLISTERS/SORES; PLEASE USE COBAN WRAP!!  . Gemfibrozil Other (See Comments)    Patient is not aware of this allergy but states that she had side effects to a cholesterol medication in the past  . Oxycodone Other (See Comments)    Hallucinations   . Carbamazepine Other (See Comments)    Reaction to tegretol - "loopy"  . Clonazepam Other (See Comments)    Sleepy   . Macrobid [Nitrofurantoin] Nausea And Vomiting    Family History  Problem Relation Age of Onset  . Breast cancer Mother   . Heart disease Father   . Diabetes Father   . Heart disease Brother      Prior to Admission medications   Medication Sig Start Date End Date Taking? Authorizing Provider  aspirin EC 81 MG EC tablet Take 1 tablet (81 mg total) by mouth daily. 10/18/15  Yes Reyne Dumas, MD  atorvastatin (LIPITOR) 80 MG tablet Take 1 tablet (80 mg total) by mouth daily. 12/10/15  Yes Rama, Venetia Maxon, MD  blood glucose meter kit and supplies KIT Dispense based on patient and insurance preference. Use up to four times daily as directed. (FOR ICD-10 E11.65) 09/18/16  Yes Nida, Marella Chimes, MD  calcium acetate (PHOSLO) 667 MG capsule Take 2 capsules (1,334 mg total) by mouth 3 (three) times daily with meals. 10/24/15  Yes Love, Ivan Anchors, PA-C  cetirizine (ZYRTEC) 10 MG tablet Take 10 mg by mouth daily.    Yes [provider]  diazepam (VALIUM) 5 MG tablet Take 5 mg by mouth 3 (three) times daily as needed for anxiety.    Yes [provider]  ferric citrate (AURYXIA) 1 GM 210 MG(Fe) tablet Take 210 mg by mouth 3 (three) times daily with meals.    Yes [provider]  HYDROcodone-acetaminophen (NORCO/VICODIN) 5-325 MG tablet Take 1 tablet by mouth every 6 (six) hours as needed for moderate pain (Must last 30 days). 10/22/17  Yes Sanjuana Kava, MD  Insulin Lispro Prot & Lispro (HUMALOG MIX 75/25 KWIKPEN) (75-25) 100 UNIT/ML Kwikpen INJECT 35 UNITS S.Q. WITH BREAKFAST; 30 UNITS S.Q.  WITH SUPPER WHEN PRE-MEAL GLUCOSE IS ABOVE 90MG/DL. Patient taking differently: Inject 30-35 Units into the skin 2 (two) times daily. INJECT 30 UNITS S.Q. WITH BREAKFAST; 30-35 UNITS S.Q. WITH SUPPER WHEN PRE-MEAL GLUCOSE IS ABOVE 90MG/DL. 09/17/17  Yes Nida, Marella Chimes, MD  metoprolol tartrate (LOPRESSOR) 50 MG tablet Take 1 tablet (50 mg total) by mouth 2 (two) times daily. 06/18/17 11/15/17 Yes Herminio Commons, MD  nitroGLYCERIN (NITROSTAT) 0.4 MG SL tablet Place 1 tablet (0.4 mg total) under the tongue every 5 (five) minutes as needed for chest pain. Max three pills 10/24/15  Yes Love, Ivan Anchors, PA-C  OXYGEN Inhale 2 L into the lungs continuous.    Yes [provider]  polyethylene glycol (MIRALAX / GLYCOLAX) packet Take 17 g by mouth daily as needed for mild constipation. 12/20/16  Yes Virgina Jock A, PA-C  protein supplement (RESOURCE  BENEPROTEIN) POWD Take 6 g by mouth 3 (three) times daily with meals. 10/24/15  Yes Love, Ivan Anchors, PA-C  rOPINIRole (REQUIP) 0.25 MG tablet Take 1 tablet (0.25 mg total) by mouth at bedtime. 12/10/15  Yes Rama, Venetia Maxon, MD  sertraline (ZOLOFT) 100 MG tablet Take 200 mg by mouth daily. 05/07/16  Yes [provider]  Vitamin D, Ergocalciferol, (DRISDOL) 50000 units CAPS capsule Take 50,000 Units by mouth every Thursday.    Yes [provider]  warfarin (COUMADIN) 5 MG tablet Take 7.5 mg by mouth daily on Monday and Thursday. Take 5 mg by mouth daily on all other days Patient taking differently: Take 2.5-5 mg by mouth daily. Take 2.5 mg on Sundays. All other days take 5 mg 12/20/16  Yes Alvia [place], PA-C  Insulin Lispro Prot & Lispro (HUMALOG MIX 75/25 KWIKPEN) (75-25) 100 UNIT/ML Kwikpen INJECT 35 UNITS S.Q. WITH BREAKFAST; 30 UNITS S.Q. WITH SUPPER WHEN PRE-MEAL GLUCOSE IS ABOVE 90ML/DL. Patient not taking: Reported on 11/15/2017 08/22/17   Cassandria Anger, MD    Physical Exam: BP (!) 130/52   Pulse 65   Temp 98 F  (36.7 C) (Oral)   Resp 19   Ht '5\' 4"'  (1.626 m)   Wt 95.3 kg (210 lb)   SpO2 (!) 83%   BMI 36.05 kg/m   . General: 72 y.o. year-old female well developed well nourished in no acute distress.  Alert and oriented x3. . Cardiovascular: Regular rate and rhythm with no rubs or gallops.  No thyromegaly or JVD noted.  1+ pitting edema in lower extremities bilaterally. Marland Kitchen Respiratory: Mild rales at bases with no wheezes.  Good inspiratory effort. . Abdomen: Soft nontender nondistended with normal bowel sounds x4 quadrants. . Musculoskeletal: 1+ pitting edema in lower extremities bilaterally. 2/4 pulses in all 4 extremities. . Skin: No ulcerative lesions noted or rashes . Psychiatry: Mood is appropriate for condition and setting          Labs on Admission:  Basic Metabolic Panel: Recent Labs  Lab 11/15/17 2040  NA 133*  K 4.7  CL 92*  CO2 29  GLUCOSE 446*  BUN 23*  CREATININE 3.77*  CALCIUM 8.7*   Liver Function Tests: Recent Labs  Lab 11/15/17 2040  AST 24  ALT 17  ALKPHOS 117  BILITOT 1.1  PROT 6.9  ALBUMIN 3.6   No results for input(s): LIPASE, AMYLASE in the last 168 hours. No results for input(s): AMMONIA in the last 168 hours. CBC: Recent Labs  Lab 11/15/17 2040  WBC 6.6  HGB 12.6  HCT 40.9  MCV 100.7*  PLT 115*   Cardiac Enzymes: No results for input(s): CKTOTAL, CKMB, CKMBINDEX, TROPONINI in the last 168 hours.  BNP (last 3 results) No results for input(s): BNP in the last 8760 hours.  ProBNP (last 3 results) No results for input(s): PROBNP in the last 8760 hours.  CBG: Recent Labs  Lab 11/15/17 2130  GLUCAP 343*    Radiological Exams on Admission: Dg Chest 2 View  Result Date: 11/15/2017 CLINICAL DATA:  Visual hallucinations for several days. EXAM: CHEST - 2 VIEW COMPARISON:  11/07/2016. FINDINGS: Progressive enlargement of the cardiac silhouette. Increased prominence of the pulmonary vasculature and interstitial markings. No pleural fluid.  Interval patchy and linear density at the left lung base. Stable post CABG changes and right subclavian pacemaker leads. Thoracic spine degenerative changes. IMPRESSION: 1. Progressive cardiomegaly with progressive mild changes of congestive heart failure. 2. Interval left basilar  atelectasis and possible pneumonia. Electronically Signed   By: Claudie Revering M.D.   On: 11/15/2017 22:36   Ct Head Wo Contrast  Result Date: 11/15/2017 CLINICAL DATA:  Hallucinations. History of end-stage renal disease, chronic anemia, congestive heart failure, diabetes, hypertension, hyperlipidemia, coronary artery disease. EXAM: CT HEAD WITHOUT CONTRAST TECHNIQUE: Contiguous axial images were obtained from the base of the skull through the vertex without intravenous contrast. COMPARISON:  MRI brain 03/02/2014.  CT head 03/02/2014. FINDINGS: Brain: Mild cerebral atrophy. Mild ventricular dilatation consistent with central atrophy. Patchy low-attenuation changes in the deep white matter likely representing small vessel ischemic change. Small focal encephalomalacia in the right occipital lobe likely due to old infarct. No mass effect or midline shift. No abnormal extra-axial fluid collections. Gray-white matter junctions are distinct. Basal cisterns are not effaced. No acute intracranial hemorrhage. Vascular: Intracranial arterial vascular calcifications are present. Skull: Calvarium appears intact. No acute depressed skull fractures. Sinuses/Orbits: No acute finding. Other: None. IMPRESSION: No acute intracranial abnormalities. Chronic atrophy and small vessel ischemic changes. 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 Electronically Signed   By: Lucienne Capers M.D.   On: 11/15/2017 23:18    EKG: Independently reviewed.  Personally reviewed EKG which revealed ventricular paced rhythm at 61.  Assessment/Plan Present on Admission: . HCAP (healthcare-associated pneumonia)  Active Problems:    HCAP (healthcare-associated pneumonia)   Acute metabolic encephalopathy/visual hallucinations At the time of this visit had resolved CT head no contrast done on admission revealed acute intracranial abnormalities.  Chronic atrophy and small vessel ischemic changes.  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 PT OT to assess in the morning Reorient as needed Fall precautions  Acute on chronic hypoxic respiratory failure secondary to suspected HCAP versus fluid overload Chest x-ray done on admission personally reviewed revealed left lower lobe infiltrates versus atelectasis, cardiomegaly with increase in pulmonary vascularity bilaterally Bilateral lower extremity edema on physical exam Start IV Zosyn and IV vancomycin Obtain MRSA screening Obtain procalcitonin If MRSA screening negative discontinue IV vancomycin DuoNebs every 6 hours and every 2 hours as needed O2 supplementation to maintain O2 saturation 92% or greater Please contact nephrology in the morning for possible hemodialysis  Mild hypervolemic hyponatremia Sodium 133; possibly dilutional Repeat BMP in the morning Consult nephrology in the morning  End-stage renal disease on dialysis Tuesday Thursday Saturday Self-reported completed hemodialysis today Saturday, 11/15/2017 Please contact nephrology in the morning Chest x-ray personally reviewed revealed cardiomegaly with increase in pulmonary vascularity bilaterally  Type 2 diabetes Start moderate insulin sliding scale Start renal dialysis diabetic diet Obtain A1c Last A1c done on 06/10/2017 revealed hemoglobin A1c of 9.7  Paroxysmal A. fib/history of DVT On Coumadin Obtain INR Rate controlled Continue cardiac meds Continue Coumadin  Chronic diastolic CHF with EF of 16% Last 2D echo done on 10/27/2017 revealed hypokinesis at the inferior myocardium Continue cardiac medications  Coronary artery disease status post CABG Continue current  medications Monitor closely on telemetry  Hyperlipidemia Continue home meds  Risks: High risk for decompensation due to acute on chronic hypoxic respiratory failure due to suspected HCAP versus fluid overload, end-stage renal disease on dialysis, chronic diastolic CHF, paroxysmal A. Fib, multiple comorbidities, and advanced age.  DVT prophylaxis: Coumadin  Code Status: Full code  Family Communication: None at bedside  Disposition Plan: Admit to telemetry unit  Consults called: None  Admission status: Inpatient status    Kayleen Memos MD [facility] Pager  [phone]  If 7PM-7AM, please contact night-coverage [email or web address] Password [facility]  11/16/2017, 12:35 AM

## 2017-11-16 NOTE — Progress Notes (Signed)
ANTICOAGULATION CONSULT NOTE - Initial Consult  Pharmacy Consult for Coumadin Indication: atrial fibrillation  Allergies  Allergen Reactions  . Tape Other (See Comments)    TAPE WILL CAUSE BLISTERS/SORES; PLEASE USE COBAN WRAP!!  . Gemfibrozil Other (See Comments)    Patient is not aware of this allergy but states that she had side effects to a cholesterol medication in the past  . Oxycodone Other (See Comments)    Hallucinations   . Carbamazepine Other (See Comments)    Reaction to tegretol - "loopy"  . Clonazepam Other (See Comments)    Sleepy   . Macrobid [Nitrofurantoin] Nausea And Vomiting    Patient Measurements: Height: 5\' 4"  (162.6 cm) Weight: 192 lb 6.4 oz (87.3 kg) IBW/kg (Calculated) : 54.7  Vital Signs: Temp: 98.3 F (36.8 C) (06/23 0403) Temp Source: Oral (06/23 0403) BP: 126/61 (06/23 0403) Pulse Rate: 59 (06/23 0403)  Labs: Recent Labs    11/15/17 2040 11/16/17 0551  HGB 12.6 12.1  HCT 40.9 39.8  PLT 115* 133*  LABPROT  --  21.8*  INR  --  1.92  CREATININE 3.77* 3.98*    Estimated Creatinine Clearance: 13.7 mL/min (A) (by C-G formula based on SCr of 3.98 mg/dL (H)).    Assessment: 72 y.o. female admitted with PNA, h/o Afib, to continue warfarin. INR this morning just below goal range at 1.92. Hemoglobin and platelets appear to be within normal limits near patient baseline. Patient last reported dose on 6/21.   PTA dose of warfarin noted to be 5mg  all days except 2.5mg  on Sundays.   Goal of Therapy:  INR 2-3 Monitor platelets by anticoagulation protocol: Yes   Plan:  Warfarin 2.5mg  x 1 dose (per PTA dosing) Daily INR/ CBC  Monitor for s/sx of bleeding   Jalene Mullet, Pharm.D. PGY1 Pharmacy Resident 11/16/2017 8:04 AM Please check AMION for all [place] numbers

## 2017-11-16 NOTE — Consult Note (Addendum)
[facility] Renal Consultation Note     End stage renal disease patient from the [facility] area  She is to have dialysis this evening emergently due to her weight loss and pulmonary edema  Out patient records are not available at this time  She does not have any allergies or issues with dialysis and her white count is in the normal range  This appears to be worsening dyspnea with no myocardial infarction and radiological evidence of pulmonary edema        Indication for Consultation:  Management of ESRD/hemodialysis, anemia, hypertension/volume, and secondary hyperparathyroidism. PCP:  HPI: Briana [name] is a 72 y.o. female with ESRD, CAD (s/p CABG, Hx ICD), A-fib (on warfarin), carotid disease (s/p B endarterectomy), dCHF, Type 2 DM, PVD, and OSA who was been admitted with dyspnea and visual hallucinations.   Pt reports that she has been weak for 5 days, then daughter reports she developed visual hallucinations 2 days ago, + dyspnea which started yesterday. No fever or chills. Pt denies cough, N/V or diarrhea. She did complete her HD as usual yesterday and then brought to ED afterwards by her daughter. In ED, labs showed Na 133, K 4.7, LA 2.18, WBC 6.6, Hgb 12.6, INR 1.9, trop 0.2. CXR showed + pulm edema +/- pneumonia. She is anuric.  Dialyzes on TTS schedule at [facility]. I do not have access to those records, will not until tomorrow. She reports that she never misses her treatments. Uses L AVF without recent issues.  Past Medical History:  Diagnosis Date  . Adenomatous colon polyp 09/2007  . Arthritis   . Atrial fibrillation with rapid ventricular response ([place]) 10/05/2015  . Bradycardia   . CAD (coronary artery disease)    a. s/p CABG 2005.  . Carotid artery disease ([place])    a. s/p R CEA 2011. Followed by vascular -  duplex 04/16/16: <63% RICA, 81-77% LICA.  Marland Kitchen Chronic anemia   . Chronic diastolic CHF (congestive heart failure) ([place])   . Depression with  anxiety   . Diabetes mellitus   . ESRD (end stage renal disease) ([place])    T Th Sat  . Heart murmur   . Hyperlipidemia   . Hypertension   . Neuropathy   . PAD (peripheral artery disease) ([place])   . Paroxysmal atrial flutter ([place])   . Peripheral vascular disease ([place])   . Pulmonary hypertension ([place])   . Rheumatic fever   . Sleep apnea    Past Surgical History:  Procedure Laterality Date  . BASCILIC VEIN TRANSPOSITION Left 12/04/2015   Procedure: BASILIC VEIN TRANSPOSITION;  Surgeon: Rosetta Posner, MD;  Location: [place];  Service: Vascular;  Laterality: Left;  . CAROTID ENDARTERECTOMY  10/04/2009   Right CEA  . CORONARY ARTERY BYPASS GRAFT  2005  . ENDARTERECTOMY Left 12/18/2016   Procedure: ENDARTERECTOMY CAROTID;  Surgeon: Rosetta Posner, MD;  Location: [place];  Service: Vascular;  Laterality: Left;  . HEMATOMA EVACUATION Left 12/18/2016   Procedure: EVACUATION of left neck HEMATOMA;  Surgeon: Elam Dutch, MD;  Location: [place];  Service: Vascular;  Laterality: Left;  . PACEMAKER IMPLANT N/A 11/06/2016   Procedure: Pacemaker Implant;  Surgeon: Evans Lance, MD;  Location: [place] [facility];  Service: Cardiovascular;  Laterality: N/A;  . PATCH ANGIOPLASTY Left 12/18/2016   Procedure: PATCH ANGIOPLASTY WITH HEMASHIELD PLATIUM FINESSE PATCH;  Surgeon: Rosetta Posner, MD;  Location: [place];  Service: Vascular;  Laterality: Left;  .  PERIPHERAL VASCULAR CATHETERIZATION N/A 06/14/2016   Procedure: A/V Fistulagram - Left Upper;  Surgeon: Elam Dutch, MD;  Location: [place] [facility];  Service: Cardiovascular;  Laterality: N/A;  . POLYPECTOMY  02/25/2012   Procedure: POLYPECTOMY;  Surgeon: Danie Binder, MD;  Location: [facility];  Service: Endoscopy;  Laterality: N/A;  . TUBAL LIGATION     Family History  Problem Relation Age of Onset  . Breast cancer Mother   . Heart disease Father   . Diabetes Father   . Heart disease Brother    Social History:  reports that she has never smoked.  She has never used smokeless tobacco. She reports that she does not drink alcohol or use drugs.  ROS: As per HPI otherwise negative.  Physical Exam: Vitals:   11/16/17 0403 11/16/17 0532 11/16/17 0837 11/16/17 1006  BP: 126/61   (!) 115/46  Pulse: (!) 59   (!) 59  Resp: 20   20  Temp: 98.3 F (36.8 C)   (!) 97.5 F (36.4 C)  TempSrc: Oral   Oral  SpO2: 94%  92% 94%  Weight:  87.3 kg (192 lb 6.4 oz)    Height:         General: Well developed, well nourished, in no acute distress. On nasal oxygen. Head: Normocephalic, atraumatic, sclera non-icteric, mucus membranes are moist. Neck: Supple without lymphadenopathy/masses. JVD not elevated. Lungs: + Bibasilar rales. No wheezing. Heart: RRR with normal S1, S2. No murmurs, rubs, or gallops appreciated. Abdomen: Soft, non-tender, non-distended with normoactive bowel sounds.  Musculoskeletal:  Strength and tone appear normal for age. Lower extremities: 1-2+ pitting LE edema Neuro: Alert and oriented X 3. Moves all extremities spontaneously. Psych:  Responds to questions appropriately with a normal affect, albeit rambling. Dialysis Access: L AVF + thrill  Allergies  Allergen Reactions  . Tape Other (See Comments)    TAPE WILL CAUSE BLISTERS/SORES; PLEASE USE COBAN WRAP!!  . Gemfibrozil Other (See Comments)    Patient is not aware of this allergy but states that she had side effects to a cholesterol medication in the past  . Oxycodone Other (See Comments)    Hallucinations   . Carbamazepine Other (See Comments)    Reaction to tegretol - "loopy"  . Clonazepam Other (See Comments)    Sleepy   . Macrobid [Nitrofurantoin] Nausea And Vomiting   Prior to Admission medications   Medication Sig Start Date End Date Taking? Authorizing Provider  aspirin EC 81 MG EC tablet Take 1 tablet (81 mg total) by mouth daily. 10/18/15  Yes Reyne Dumas, MD  atorvastatin (LIPITOR) 80 MG tablet Take 1 tablet (80 mg total) by mouth daily. 12/10/15  Yes  Rama, Venetia Maxon, MD  blood glucose meter kit and supplies KIT Dispense based on patient and insurance preference. Use up to four times daily as directed. (FOR ICD-10 E11.65) 09/18/16  Yes Nida, Marella Chimes, MD  calcium acetate (PHOSLO) 667 MG capsule Take 2 capsules (1,334 mg total) by mouth 3 (three) times daily with meals. 10/24/15  Yes Love, Ivan Anchors, PA-C  cetirizine (ZYRTEC) 10 MG tablet Take 10 mg by mouth daily.    Yes [provider]  diazepam (VALIUM) 5 MG tablet Take 5 mg by mouth 3 (three) times daily as needed for anxiety.    Yes [provider]  ferric citrate (AURYXIA) 1 GM 210 MG(Fe) tablet Take 210 mg by mouth 3 (three) times daily with meals.    Yes [provider]  HYDROcodone-acetaminophen (NORCO/VICODIN) 5-325 MG tablet Take 1 tablet by mouth every 6 (six) hours as needed for moderate pain (Must last 30 days). 10/22/17  Yes Sanjuana Kava, MD  Insulin Lispro Prot & Lispro (HUMALOG MIX 75/25 KWIKPEN) (75-25) 100 UNIT/ML Kwikpen INJECT 35 UNITS S.Q. WITH BREAKFAST; 30 UNITS S.Q. WITH SUPPER WHEN PRE-MEAL GLUCOSE IS ABOVE 90MG/DL. Patient taking differently: Inject 30-35 Units into the skin 2 (two) times daily. INJECT 30 UNITS S.Q. WITH BREAKFAST; 30-35 UNITS S.Q. WITH SUPPER WHEN PRE-MEAL GLUCOSE IS ABOVE 90MG/DL. 09/17/17  Yes Nida, Marella Chimes, MD  metoprolol tartrate (LOPRESSOR) 50 MG tablet Take 1 tablet (50 mg total) by mouth 2 (two) times daily. 06/18/17 11/15/17 Yes Herminio Commons, MD  nitroGLYCERIN (NITROSTAT) 0.4 MG SL tablet Place 1 tablet (0.4 mg total) under the tongue every 5 (five) minutes as needed for chest pain. Max three pills 10/24/15  Yes Love, Ivan Anchors, PA-C  OXYGEN Inhale 2 L into the lungs continuous.    Yes [provider]  polyethylene glycol (MIRALAX / GLYCOLAX) packet Take 17 g by mouth daily as needed for mild constipation. 12/20/16  Yes Virgina Jock A, PA-C  protein supplement (RESOURCE BENEPROTEIN) POWD Take 6  g by mouth 3 (three) times daily with meals. 10/24/15  Yes Love, Ivan Anchors, PA-C  rOPINIRole (REQUIP) 0.25 MG tablet Take 1 tablet (0.25 mg total) by mouth at bedtime. 12/10/15  Yes Rama, Venetia Maxon, MD  sertraline (ZOLOFT) 100 MG tablet Take 200 mg by mouth daily. 05/07/16  Yes [provider]  Vitamin D, Ergocalciferol, (DRISDOL) 50000 units CAPS capsule Take 50,000 Units by mouth every Thursday.    Yes [provider]  warfarin (COUMADIN) 5 MG tablet Take 7.5 mg by mouth daily on Monday and Thursday. Take 5 mg by mouth daily on all other days Patient taking differently: Take 2.5-5 mg by mouth daily. Take 2.5 mg on Sundays. All other days take 5 mg 12/20/16  Yes Alvia Grove, PA-C  Insulin Lispro Prot & Lispro (HUMALOG MIX 75/25 KWIKPEN) (75-25) 100 UNIT/ML Kwikpen INJECT 35 UNITS S.Q. WITH BREAKFAST; 30 UNITS S.Q. WITH SUPPER WHEN PRE-MEAL GLUCOSE IS ABOVE 90ML/DL. Patient not taking: Reported on 11/15/2017 08/22/17   Cassandria Anger, MD   Current Facility-Administered Medications  Medication Dose Route Frequency Provider Last Rate Last Dose  . 0.9 %  sodium chloride infusion   Intravenous PRN Kayleen Memos, DO 10 mL/hr at 11/16/17 0919    . aspirin EC tablet 81 mg  81 mg Oral Daily Hall, Carole N, DO      . atorvastatin (LIPITOR) tablet 80 mg  80 mg Oral Daily Bessie, Winton N, DO      . Chlorhexidine Gluconate Cloth 2 % PADS 6 each  6 each Topical Q0600 Loren Racer, PA-C      . diazepam (VALIUM) tablet 5 mg  5 mg Oral TID PRN Irene Pap N, DO   5 mg at 11/16/17 2094  . ferric citrate (AURYXIA) tablet 210 mg  210 mg Oral TID WC Hall, Carole N, DO   210 mg at 11/16/17 7096  . HYDROcodone-acetaminophen (NORCO/VICODIN) 5-325 MG per tablet 1 tablet  1 tablet Oral Q6H PRN Hosie Poisson, MD   1 tablet at 11/16/17 0907  . insulin aspart (novoLOG) injection 0-9 Units  0-9 Units Subcutaneous TID WC Irene Pap N, DO   3 Units at 11/16/17 2836  . ipratropium-albuterol  (DUONEB) 0.5-2.5 (3) MG/3ML nebulizer solution 3 mL  3  mL Nebulization Q6H Hall, Carole N, DO      . ipratropium-albuterol (DUONEB) 0.5-2.5 (3) MG/3ML nebulizer solution 3 mL  3 mL Nebulization Q2H PRN Irene Pap N, DO      . metoprolol tartrate (LOPRESSOR) tablet 50 mg  50 mg Oral BID Hall, Carole N, DO      . piperacillin-tazobactam (ZOSYN) IVPB 3.375 g  3.375 g Intravenous Q12H Irene Pap N, DO 12.5 mL/hr at 11/16/17 0924 3.375 g at 11/16/17 0924  . rOPINIRole (REQUIP) tablet 0.25 mg  0.25 mg Oral QHS Hall, Carole N, DO   0.25 mg at 11/16/17 0249  . sertraline (ZOLOFT) tablet 200 mg  200 mg Oral Daily Kayleen Memos, DO      . [START ON 11/18/2017] vancomycin (VANCOCIN) IVPB 750 mg/150 ml premix  750 mg Intravenous Q T,Th,Sa-HD Hall, Carole N, DO      . warfarin (COUMADIN) tablet 2.5 mg  2.5 mg Oral ONCE-1800 Jalene Mullet, RPH      . Warfarin - Pharmacist Dosing Inpatient   Does not apply q1800 Kayleen Memos, DO       Labs: Basic Metabolic Panel: Recent Labs  Lab 11/15/17 2040 11/16/17 0551  NA 133* 138  K 4.7 4.3  CL 92* 96*  CO2 29 32  GLUCOSE 446* 246*  BUN 23* 24*  CREATININE 3.77* 3.98*  CALCIUM 8.7* 8.8*   Liver Function Tests: Recent Labs  Lab 11/15/17 2040 11/16/17 0551  AST 24 22  ALT 17 18  ALKPHOS 117 121  BILITOT 1.1 1.2  PROT 6.9 6.8  ALBUMIN 3.6 3.4*   CBC: Recent Labs  Lab 11/15/17 2040 11/16/17 0551  WBC 6.6 6.3  HGB 12.6 12.1  HCT 40.9 39.8  MCV 100.7* 99.7  PLT 115* 133*   Studies/Results: Dg Chest 2 View  Result Date: 11/15/2017 CLINICAL DATA:  Visual hallucinations for several days. EXAM: CHEST - 2 VIEW COMPARISON:  11/07/2016. FINDINGS: Progressive enlargement of the cardiac silhouette. Increased prominence of the pulmonary vasculature and interstitial markings. No pleural fluid. Interval patchy and linear density at the left lung base. Stable post CABG changes and right subclavian pacemaker leads. Thoracic spine degenerative changes.  IMPRESSION: 1. Progressive cardiomegaly with progressive mild changes of congestive heart failure. 2. Interval left basilar atelectasis and possible pneumonia. Electronically Signed   By: Claudie Revering M.D.   On: 11/15/2017 22:36   Ct Head Wo Contrast  Result Date: 11/15/2017 CLINICAL DATA:  Hallucinations. History of end-stage renal disease, chronic anemia, congestive heart failure, diabetes, hypertension, hyperlipidemia, coronary artery disease. EXAM: CT HEAD WITHOUT CONTRAST TECHNIQUE: Contiguous axial images were obtained from the base of the skull through the vertex without intravenous contrast. COMPARISON:  MRI brain 03/02/2014.  CT head 03/02/2014. FINDINGS: Brain: Mild cerebral atrophy. Mild ventricular dilatation consistent with central atrophy. Patchy low-attenuation changes in the deep white matter likely representing small vessel ischemic change. Small focal encephalomalacia in the right occipital lobe likely due to old infarct. No mass effect or midline shift. No abnormal extra-axial fluid collections. Gray-white matter junctions are distinct. Basal cisterns are not effaced. No acute intracranial hemorrhage. Vascular: Intracranial arterial vascular calcifications are present. Skull: Calvarium appears intact. No acute depressed skull fractures. Sinuses/Orbits: No acute finding. Other: None. IMPRESSION: No acute intracranial abnormalities. Chronic atrophy and small vessel ischemic changes. 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 Electronically Signed   By: Lucienne Capers M.D.   On: 11/15/2017 23:18    Dialysis  Orders:  TTS at [facility], EDW 101kg?, AVF - Full records unable to be obtained until 6/24- HD unit closed.  Assessment/Plan: 1.  Dyspnea/pulm edema: Will plan for extra HD for volume today. On IV Vanc/Zosyn to cover for possible pneumonia. 2.  Visual hallucinations: Head CT without acute findings, ?possible old CVA. Seems to have  resolved. Per primary.  3.  ESRD:  HD today, as above, then back to usual TTS schedule likely. 4.  Hypertension/volume: BP ok, + edema/CXR wet - will need to lower EDW. 5.  Anemia: Hgb 12.1, no ESA needs for now. 6.  Metabolic bone disease: Ca ok, continue home binder. 7.  CAD (s/p CABG/ICD): Trop 0.2, per primary. 8.  Type 2 DM: On insulin, per primary. 9.  PVD 10.   A-fib (on warfarin)  I have seen and examined this patient and agree with the plan of care   [facility] 11/16/2017, 2:01 PM   Veneta Penton, PA-C 11/16/2017, 11:57 AM  [facility] Pager: [phone]

## 2017-11-16 NOTE — Plan of Care (Signed)
Reviewed with pt and family the importance of use of call bell, family supervision, and use of bed alarm when family not present to help prevent falls due to intermittent confusion/hallucinations.   Pt reports seeing an alligator on arrival to unit and reported to daughter that "prisoners learning to be nurses" were taking her blood last night.  At this time, pt appears lucid and able to following directions.  Forde Dandy is appropriate.

## 2017-11-16 NOTE — Progress Notes (Signed)
Briana [name] is a 72 y.o. female with medical history significant for end-stage renal disease on dialysis Tuesday Thursday Saturday, nocturnal hypoxia on 2 L of oxygen at night, coronary artery disease status post CABG, paroxysmal A. fib on Coumadin, post AICD placement, chronic diastolic CHF, pulmonary hypertension, who presented to ED [facility] from home accompanied by her daughter due to persistent visual hallucinations.  Onset today.  CT head no contrast done in the ED revealed possible old infarct in the region of the right posterior occipital lobe.  At the time of this evaluation patient was alert and oriented x3 and stated that she was no longer having hallucinations.    Chest x-ray on presentation revealed left lower lobe infiltrates versus atelectasis and increase in pulmonary vascularity with suspicion for fluid overload.  Assessment:  1. Acute respiratory failure with hypoxia secondary to fluid overload from possibly acute on chronic diastolic heart failure and possibly HCAP. Marland Kitchen She had recent echocardiogram showing diastolic dysfunction with hypokinesis of the inferior myocardium.     Plan: 1. Transfer to step down.  2. Pt on 4 lit of [place] oxygen , will probably need BIPAP if she worsens.  3. Possible HD today.  4. Nephrology consulted.  5. Continue with Broad spectrum antibiotics.    Hosie Poisson, md [phone]

## 2017-11-16 NOTE — Progress Notes (Signed)
ANTICOAGULATION CONSULT NOTE - Initial Consult  Pharmacy Consult for Coumadin Indication: atrial fibrillation  Allergies  Allergen Reactions  . Tape Other (See Comments)    TAPE WILL CAUSE BLISTERS/SORES; PLEASE USE COBAN WRAP!!  . Gemfibrozil Other (See Comments)    Patient is not aware of this allergy but states that she had side effects to a cholesterol medication in the past  . Oxycodone Other (See Comments)    Hallucinations   . Carbamazepine Other (See Comments)    Reaction to tegretol - "loopy"  . Clonazepam Other (See Comments)    Sleepy   . Macrobid [Nitrofurantoin] Nausea And Vomiting    Patient Measurements: Height: '5\' 4"'  (162.6 cm) Weight: 210 lb (95.3 kg) IBW/kg (Calculated) : 54.7  Vital Signs: Temp: 98 F (36.7 C) (06/22 2023) Temp Source: Oral (06/22 2023) BP: 130/52 (06/22 2315) Pulse Rate: 65 (06/22 2315)  Labs: Recent Labs    11/15/17 2040  HGB 12.6  HCT 40.9  PLT 115*  CREATININE 3.77*    Estimated Creatinine Clearance: 15.1 mL/min (A) (by C-G formula based on SCr of 3.77 mg/dL (H)).   Medical History: Past Medical History:  Diagnosis Date  . Adenomatous colon polyp 09/2007  . Arthritis   . Atrial fibrillation with rapid ventricular response ([place]) 10/05/2015  . Bradycardia   . CAD (coronary artery disease)    a. s/p CABG 2005.  . Carotid artery disease ([place])    a. s/p R CEA 2011. Followed by vascular -  duplex 04/16/16: <01% RICA, 75-10% LICA.  Marland Kitchen Chronic anemia   . Chronic diastolic CHF (congestive heart failure) ([place])   . Depression with anxiety   . Diabetes mellitus   . ESRD (end stage renal disease) ([place])    T Th Sat  . Heart murmur   . Hyperlipidemia   . Hypertension   . Neuropathy   . PAD (peripheral artery disease) ([place])   . Paroxysmal atrial flutter ([place])   . Peripheral vascular disease ([place])   . Pulmonary hypertension ([place])   . Rheumatic fever   . Sleep apnea     Medications:  Medications Prior to Admission   Medication Sig Dispense Refill Last Dose  . aspirin EC 81 MG EC tablet Take 1 tablet (81 mg total) by mouth daily. 30 tablet 1 11/15/2017 at 0515  . atorvastatin (LIPITOR) 80 MG tablet Take 1 tablet (80 mg total) by mouth daily. 30 tablet 3 11/14/2017 at Unknown time  . blood glucose meter kit and supplies KIT Dispense based on patient and insurance preference. Use up to four times daily as directed. (FOR ICD-10 E11.65) 1 each 0 11/15/2017 at Unknown time  . calcium acetate (PHOSLO) 667 MG capsule Take 2 capsules (1,334 mg total) by mouth 3 (three) times daily with meals. 180 capsule 1 11/15/2017 at Unknown time  . cetirizine (ZYRTEC) 10 MG tablet Take 10 mg by mouth daily.    11/14/2017 at Unknown time  . diazepam (VALIUM) 5 MG tablet Take 5 mg by mouth 3 (three) times daily as needed for anxiety.    unk  . ferric citrate (AURYXIA) 1 GM 210 MG(Fe) tablet Take 210 mg by mouth 3 (three) times daily with meals.    11/15/2017 at Unknown time  . HYDROcodone-acetaminophen (NORCO/VICODIN) 5-325 MG tablet Take 1 tablet by mouth every 6 (six) hours as needed for moderate pain (Must last 30 days). 112 tablet 0 unk  . Insulin Lispro Prot & Lispro (HUMALOG MIX 75/25 KWIKPEN) (75-25) 100 UNIT/ML  Kwikpen INJECT 35 UNITS S.Q. WITH BREAKFAST; 30 UNITS S.Q. WITH SUPPER WHEN PRE-MEAL GLUCOSE IS ABOVE 90MG/DL. (Patient taking differently: Inject 30-35 Units into the skin 2 (two) times daily. INJECT 30 UNITS S.Q. WITH BREAKFAST; 30-35 UNITS S.Q. WITH SUPPER WHEN PRE-MEAL GLUCOSE IS ABOVE 90MG/DL.) 15 mL 2 11/15/2017 at Unknown time  . metoprolol tartrate (LOPRESSOR) 50 MG tablet Take 1 tablet (50 mg total) by mouth 2 (two) times daily. 180 tablet 3 11/15/2017 at 0515  . nitroGLYCERIN (NITROSTAT) 0.4 MG SL tablet Place 1 tablet (0.4 mg total) under the tongue every 5 (five) minutes as needed for chest pain. Max three pills 45 tablet 0 unk  . OXYGEN Inhale 2 L into the lungs continuous.    11/15/2017 at Unknown time  .  polyethylene glycol (MIRALAX / GLYCOLAX) packet Take 17 g by mouth daily as needed for mild constipation.   unk  . protein supplement (RESOURCE BENEPROTEIN) POWD Take 6 g by mouth 3 (three) times daily with meals.  0 11/15/2017 at Unknown time  . rOPINIRole (REQUIP) 0.25 MG tablet Take 1 tablet (0.25 mg total) by mouth at bedtime. 30 tablet 3 11/14/2017  . sertraline (ZOLOFT) 100 MG tablet Take 200 mg by mouth daily.   11/15/2017 at Unknown time  . Vitamin D, Ergocalciferol, (DRISDOL) 50000 units CAPS capsule Take 50,000 Units by mouth every Thursday.    11/13/2017  . warfarin (COUMADIN) 5 MG tablet Take 7.5 mg by mouth daily on Monday and Thursday. Take 5 mg by mouth daily on all other days (Patient taking differently: Take 2.5-5 mg by mouth daily. Take 2.5 mg on Sundays. All other days take 5 mg) 90 tablet 3 11/14/2017 at 2100  . Insulin Lispro Prot & Lispro (HUMALOG MIX 75/25 KWIKPEN) (75-25) 100 UNIT/ML Kwikpen INJECT 35 UNITS S.Q. WITH BREAKFAST; 30 UNITS S.Q. WITH SUPPER WHEN PRE-MEAL GLUCOSE IS ABOVE 90ML/DL. (Patient not taking: Reported on 11/15/2017) 9 mL 0 Not Taking at Unknown time    Assessment: 72 y.o. female admitted with PNA, h/o Afib, to continue Coumadin Goal of Therapy:  INR 2-3 Monitor platelets by anticoagulation protocol: Yes   Plan:  F/U daily INR in am  Caryl Pina 11/16/2017,1:14 AM

## 2017-11-16 NOTE — Progress Notes (Signed)
MD order to transfer patient to step down. Received bed assignment for 2W14. Telephone call to 2W Erasmo Downer, RN) to provide report. Bartholomew Crews, RN

## 2017-11-16 NOTE — Progress Notes (Signed)
Pharmacy Antibiotic Note  Briana [name] is a 72 y.o. female admitted on 11/15/2017 with AMS/HCAP.  Pharmacy has been consulted for Vancomycin and Zosyn  dosing.  Plan: Vancomycin 1500 mg IV now, then 750 mg IV after each HD Zosyn 3.375 g IV q12h   Height: 5\' 4"  (162.6 cm) Weight: 210 lb (95.3 kg) IBW/kg (Calculated) : 54.7  Temp (24hrs), Avg:98 F (36.7 C), Min:98 F (36.7 C), Max:98 F (36.7 C)  Recent Labs  Lab 11/15/17 2040 11/15/17 2112 11/15/17 2322  WBC 6.6  --   --   CREATININE 3.77*  --   --   LATICACIDVEN  --  2.18* 1.61    Estimated Creatinine Clearance: 15.1 mL/min (A) (by C-G formula based on SCr of 3.77 mg/dL (H)).    Allergies  Allergen Reactions  . Tape Other (See Comments)    TAPE WILL CAUSE BLISTERS/SORES; PLEASE USE COBAN WRAP!!  . Gemfibrozil Other (See Comments)    Patient is not aware of this allergy but states that she had side effects to a cholesterol medication in the past  . Oxycodone Other (See Comments)    Hallucinations   . Carbamazepine Other (See Comments)    Reaction to tegretol - "loopy"  . Clonazepam Other (See Comments)    Sleepy   . Macrobid [Nitrofurantoin] Nausea And Vomiting   Caryl Pina 11/16/2017 1:10 AM

## 2017-11-17 ENCOUNTER — Inpatient Hospital Stay (HOSPITAL_COMMUNITY): Payer: Medicare Other

## 2017-11-17 LAB — HEPATITIS B SURFACE ANTIGEN: HEP B S AG: NEGATIVE

## 2017-11-17 LAB — RENAL FUNCTION PANEL
ALBUMIN: 3.2 g/dL — AB (ref 3.5–5.0)
Anion gap: 14 (ref 5–15)
BUN: 36 mg/dL — AB (ref 6–20)
CALCIUM: 8.6 mg/dL — AB (ref 8.9–10.3)
CO2: 29 mmol/L (ref 22–32)
Chloride: 92 mmol/L — ABNORMAL LOW (ref 101–111)
Creatinine, Ser: 5.16 mg/dL — ABNORMAL HIGH (ref 0.44–1.00)
GFR calc Af Amer: 9 mL/min — ABNORMAL LOW (ref >60)
GFR, EST NON AFRICAN AMERICAN: 8 mL/min — AB (ref >60)
Glucose, Bld: 294 mg/dL — ABNORMAL HIGH (ref 65–99)
PHOSPHORUS: 4.7 mg/dL — AB (ref 2.5–4.6)
POTASSIUM: 4.6 mmol/L (ref 3.5–5.1)
SODIUM: 135 mmol/L (ref 135–145)

## 2017-11-17 LAB — CBC
HCT: 39.1 % (ref 36.0–46.0)
HEMOGLOBIN: 11.7 g/dL — AB (ref 12.0–15.0)
MCH: 30.5 pg (ref 26.0–34.0)
MCHC: 29.9 g/dL — AB (ref 30.0–36.0)
MCV: 101.8 fL — AB (ref 78.0–100.0)
Platelets: 146 10*3/uL — ABNORMAL LOW (ref 150–400)
RBC: 3.84 MIL/uL — ABNORMAL LOW (ref 3.87–5.11)
RDW: 15.9 % — ABNORMAL HIGH (ref 11.5–15.5)
WBC: 7.8 10*3/uL (ref 4.0–10.5)

## 2017-11-17 LAB — PROTIME-INR
INR: 1.79
Prothrombin Time: 20.6 seconds — ABNORMAL HIGH (ref 11.4–15.2)

## 2017-11-17 LAB — HEPATITIS B SURFACE ANTIBODY,QUALITATIVE: Hep B S Ab: REACTIVE

## 2017-11-17 LAB — GLUCOSE, CAPILLARY
GLUCOSE-CAPILLARY: 166 mg/dL — AB (ref 65–99)
Glucose-Capillary: 262 mg/dL — ABNORMAL HIGH (ref 65–99)
Glucose-Capillary: 273 mg/dL — ABNORMAL HIGH (ref 65–99)
Glucose-Capillary: 158 mg/dL — ABNORMAL HIGH (ref 65–99)

## 2017-11-17 MED ORDER — VANCOMYCIN HCL IN DEXTROSE 750-5 MG/150ML-% IV SOLN: 750.0000 mg | Freq: Once | INTRAVENOUS | Status: AC

## 2017-11-17 MED ORDER — VANCOMYCIN HCL IN DEXTROSE 750-5 MG/150ML-% IV SOLN
INTRAVENOUS | Status: AC
  Administered 2017-11-17: 750 mg via INTRAVENOUS
  Filled 2017-11-17: qty 150

## 2017-11-17 MED ORDER — WARFARIN SODIUM 5 MG PO TABS
5.0000 mg | ORAL_TABLET | Freq: Once | ORAL | Status: AC
  Administered 2017-11-17: 5 mg via ORAL
  Filled 2017-11-17: qty 1

## 2017-11-17 NOTE — Progress Notes (Signed)
PROGRESS NOTE    Briana [name]  UJW:[id number] DOB: 1945-09-17 DOA: 11/15/2017 PCP: Vidal Schwalbe, MD    Brief Narrative:  Briana [place] a 72 y.o.femalewith medical history significant forend-stage renal disease on dialysis Tuesday Thursday Saturday, nocturnal hypoxia on 2 L of oxygen at night, coronary artery disease status post CABG, paroxysmal A. fib on Coumadin, post AICD placement, chronic diastolic CHF, pulmonary hypertension, who presented to ED [facility] from home accompanied by her daughter due to persistent visual hallucinations.Onset today. CT head no contrast done in the ED revealed possible old infarct in the region of the right posterior occipital lobe. At the time of this evaluation patient was alert and oriented x3 and stated that she was no longer having hallucinations.   Chest x-ray on presentation revealed left lower lobe infiltrates versus atelectasis and increase in pulmonary vascularity with suspicion for fluid overload.    Assessment & Plan:   Active Problems:   HCAP (healthcare-associated pneumonia)  Acute respiratory failure with hypoxia secondary to fluid overload from possibly acute on chronic diastolic heart failure and possibly HCAP. Marland Kitchen She had recent echocardiogram showing diastolic dysfunction with hypokinesis of the inferior myocardium.  Transferred to step down, she continues to require up to 6 lit of [place] oxygen.  Continue with broad spectrum iv antibiotics.  Repeat CXR .  She will probably need another hd session today.  Blood cultures not done on admission.   ESRD ON HD  Further management as per renal.    Paroxysmal atrial fibrillation;  Rate controlled . And on coumadin for anticoagulation.     CAD s/p cabg; - pt denies any chest pain.  Resume home meds.     Visual hallucinations: Pt reports she had them earlier this am.  Will get psychiatry consult for recommendations.    DVT prophylaxis: coumadin.  Code Status:  full code.  Family Communication: none at bedside.  Disposition Plan: pending resolution of respiratory failure.   Consultants:   Nephrology.    Procedures: hd   Antimicrobials: vancomycin and zosyn since admission.    Subjective: Reports her breathing is the same.   Objective: Vitals:   11/17/17 0330 11/17/17 0400 11/17/17 0420 11/17/17 0807  BP: (!) 110/56 (!) 111/49 (!) 112/47 (!) 102/32  Pulse: 66 62 64 63  Resp: 20 20 (!) 21 (!) 21  Temp:   98.9 F (37.2 C)   TempSrc:   Oral   SpO2:   96% 94%  Weight:   100.4 kg (221 lb 5.5 oz)   Height:        Intake/Output Summary (Last 24 hours) at 11/17/2017 1011 Last data filed at 11/17/2017 0420 Gross per 24 hour  Intake -  Output 2766 ml  Net -2766 ml   Filed Weights   11/16/17 0532 11/17/17 0100 11/17/17 0420  Weight: 87.3 kg (192 lb 6.4 oz) 104.8 kg (231 lb 0.7 oz) 100.4 kg (221 lb 5.5 oz)    Examination:  General exam: Appears calm and comfortable on 6 lito f [place] oxygen.  Respiratory system: diminished at bases, no wheezing or rhonchi.  Cardiovascular system: S1 & S2 heard, RRR. No JVD, murmurs. 2+ pedal edema. Gastrointestinal system: Abdomen is nondistended, soft and nontender. No organomegaly or masses felt. Normal bowel sounds heard. Central nervous system: Alert and oriented. Non focal. Extremities: Symmetric 5 x 5 power. Skin: No rashes, lesions or ulcers Psychiatry: . apprehensive.    Data Reviewed: I have personally reviewed following labs and imaging studies  CBC:  Recent Labs  Lab 11/15/17 2040 11/16/17 0551 11/17/17 0149  WBC 6.6 6.3 7.8  HGB 12.6 12.1 11.7*  HCT 40.9 39.8 39.1  MCV 100.7* 99.7 101.8*  PLT 115* 133* 671*   Basic Metabolic Panel: Recent Labs  Lab 11/15/17 2040 11/16/17 0551 11/17/17 0148  NA 133* 138 135  K 4.7 4.3 4.6  CL 92* 96* 92*  CO2 29 32 29  GLUCOSE 446* 246* 294*  BUN 23* 24* 36*  CREATININE 3.77* 3.98* 5.16*  CALCIUM 8.7* 8.8* 8.6*  PHOS  --   --  4.7*    GFR: Estimated Creatinine Clearance: 11.4 mL/min (A) (by C-G formula based on SCr of 5.16 mg/dL (H)). Liver Function Tests: Recent Labs  Lab 11/15/17 2040 11/16/17 0551 11/17/17 0148  AST 24 22  --   ALT 17 18  --   ALKPHOS 117 121  --   BILITOT 1.1 1.2  --   PROT 6.9 6.8  --   ALBUMIN 3.6 3.4* 3.2*   No results for input(s): LIPASE, AMYLASE in the last 168 hours. No results for input(s): AMMONIA in the last 168 hours. Coagulation Profile: Recent Labs  Lab 11/16/17 0551  INR 1.92   Cardiac Enzymes: No results for input(s): CKTOTAL, CKMB, CKMBINDEX, TROPONINI in the last 168 hours. BNP (last 3 results) No results for input(s): PROBNP in the last 8760 hours. HbA1C: Recent Labs    11/16/17 0551  HGBA1C 9.6*   CBG: Recent Labs  Lab 11/15/17 2130 11/16/17 0736 11/16/17 1245 11/16/17 1641 11/16/17 2110  GLUCAP 343* 239* 290* 263* 252*   Lipid Profile: No results for input(s): CHOL, HDL, LDLCALC, TRIG, CHOLHDL, LDLDIRECT in the last 72 hours. Thyroid Function Tests: No results for input(s): TSH, T4TOTAL, FREET4, T3FREE, THYROIDAB in the last 72 hours. Anemia Panel: No results for input(s): VITAMINB12, FOLATE, FERRITIN, TIBC, IRON, RETICCTPCT in the last 72 hours. Sepsis Labs: Recent Labs  Lab 11/15/17 2112 11/15/17 2322 11/16/17 0045 11/16/17 0551  PROCALCITON  --   --  0.20 0.18  LATICACIDVEN 2.18* 1.61  --   --     Recent Results (from the past 240 hour(s))  MRSA PCR Screening     Status: None   Collection Time: 11/16/17  2:29 AM  Result Value Ref Range Status   MRSA by PCR NEGATIVE NEGATIVE Final    Comment:        The GeneXpert MRSA Assay (FDA approved for NASAL specimens only), is one component of a comprehensive MRSA colonization surveillance program. It is not intended to diagnose MRSA infection nor to guide or monitor treatment for MRSA infections. Performed at [facility], [place] [address] [place], [place] [address]           Radiology Studies: Dg Chest 2 View  Result Date: 11/15/2017 CLINICAL DATA:  Visual hallucinations for several days. EXAM: CHEST - 2 VIEW COMPARISON:  11/07/2016. FINDINGS: Progressive enlargement of the cardiac silhouette. Increased prominence of the pulmonary vasculature and interstitial markings. No pleural fluid. Interval patchy and linear density at the left lung base. Stable post CABG changes and right subclavian pacemaker leads. Thoracic spine degenerative changes. IMPRESSION: 1. Progressive cardiomegaly with progressive mild changes of congestive heart failure. 2. Interval left basilar atelectasis and possible pneumonia. Electronically Signed   By: Claudie Revering M.D.   On: 11/15/2017 22:36   Ct Head Wo Contrast  Result Date: 11/15/2017 CLINICAL DATA:  Hallucinations. History of end-stage renal disease, chronic anemia, congestive heart failure, diabetes, hypertension,  hyperlipidemia, coronary artery disease. EXAM: CT HEAD WITHOUT CONTRAST TECHNIQUE: Contiguous axial images were obtained from the base of the skull through the vertex without intravenous contrast. COMPARISON:  MRI brain 03/02/2014.  CT head 03/02/2014. FINDINGS: Brain: Mild cerebral atrophy. Mild ventricular dilatation consistent with central atrophy. Patchy low-attenuation changes in the deep white matter likely representing small vessel ischemic change. Small focal encephalomalacia in the right occipital lobe likely due to old infarct. No mass effect or midline shift. No abnormal extra-axial fluid collections. Gray-white matter junctions are distinct. Basal cisterns are not effaced. No acute intracranial hemorrhage. Vascular: Intracranial arterial vascular calcifications are present. Skull: Calvarium appears intact. No acute depressed skull fractures. Sinuses/Orbits: No acute finding. Other: None. IMPRESSION: No acute intracranial abnormalities. Chronic atrophy and small vessel ischemic changes. Probable old infarct in the  region of the right posterior occipital lobe. Electronically Signed   By: Lucienne Capers M.D.   On: 11/15/2017 23:18        Scheduled Meds: . aspirin EC  81 mg Oral Daily  . atorvastatin  80 mg Oral Daily  . Chlorhexidine Gluconate Cloth  6 each Topical Q0600  . ferric citrate  210 mg Oral TID WC  . insulin aspart  0-9 Units Subcutaneous TID WC  . metoprolol tartrate  50 mg Oral BID  . rOPINIRole  0.25 mg Oral QHS  . sertraline  200 mg Oral Daily  . Warfarin - Pharmacist Dosing Inpatient   Does not apply q1800   Continuous Infusions: . sodium chloride 10 mL/hr at 11/16/17 0919  . sodium chloride    . sodium chloride    . piperacillin-tazobactam (ZOSYN)  IV 3.375 g (11/17/17 0825)  . [START ON 11/18/2017] vancomycin       LOS: 2 days    Time spent: 40 minutes.     Hosie Poisson, MD [facility] Pager [phone] If 7PM-7AM, please contact night-coverage [email or web address] Password [facility] 11/17/2017, 10:11 AM

## 2017-11-17 NOTE — Progress Notes (Signed)
ANTICOAGULATION CONSULT NOTE - Follow-Up Consult  Pharmacy Consult for Coumadin Indication: atrial fibrillation  Patient Measurements: Height: 5\' 4"  (162.6 cm) Weight: 221 lb 5.5 oz (100.4 kg) IBW/kg (Calculated) : 54.7  Vital Signs: Temp: 98.7 F (37.1 C) (06/24 1109) Temp Source: Oral (06/24 0420) BP: 126/52 (06/24 1109) Pulse Rate: 67 (06/24 1109)  Labs: Recent Labs    11/15/17 2040 11/16/17 0551 11/17/17 0148 11/17/17 0149 11/17/17 1048  HGB 12.6 12.1  --  11.7*  --   HCT 40.9 39.8  --  39.1  --   PLT 115* 133*  --  146*  --   LABPROT  --  21.8*  --   --  20.6*  INR  --  1.92  --   --  1.79  CREATININE 3.77* 3.98* 5.16*  --   --     Estimated Creatinine Clearance: 11.4 mL/min (A) (by C-G formula based on SCr of 5.16 mg/dL (H)).  Assessment: 72 y.o. female admitted with PNA, h/o Afib, to continue warfarin. Admit INR 1.92 on PTA dose - last dose noted to be 6/21. Pharmacy consulted to dose this admission.    PTA dose of warfarin noted to be 5mg  all days except 2.5mg  on Sundays.   INR today remains SUBtherapeutic (1.79 << 1.92, goal of 2-3). Hgb/Hct slight drop, plts 146  Goal of Therapy:  INR 2-3 Monitor platelets by anticoagulation protocol: Yes   Plan:  Warfarin 5 mg x 1 dose at 1800 today Daily INR/ CBC  Monitor for s/sx of bleeding   Thank you for allowing pharmacy to be a part of this patient's care.  Alycia Rossetti, PharmD, BCPS Clinical Pharmacist Pager: [phone] Clinical phone for 11/17/2017 from 7a-3:30p: [phone] If after 3:30p, please call main pharmacy at: [phone] 11/17/2017 2:11 PM

## 2017-11-17 NOTE — Progress Notes (Signed)
Inpatient Diabetes Program Recommendations  AACE/ADA: New Consensus Statement on Inpatient Glycemic Control (2015)  Target Ranges:  Prepandial:   less than 140 mg/dL      Peak postprandial:   less than 180 mg/dL (1-2 hours)      Critically ill patients:  140 - 180 mg/dL   Lab Results  Component Value Date   GLUCAP 273 (H) 11/17/2017   HGBA1C 9.6 (H) 11/16/2017    Review of Glycemic Control Results for AJOONI, KARAM (MRN [id number]) as of 11/17/2017 14:04  Ref. Range 11/16/2017 12:45 11/16/2017 16:41 11/16/2017 21:10 11/17/2017 08:04 11/17/2017 11:06  Glucose-Capillary Latest Ref Range: 65 - 99 mg/dL 290 (H) 263 (H) 252 (H) 262 (H) 273 (H)    Diabetes history: Type 2 DM Outpatient Diabetes medications:  Humalog 75/25- 35 units with breakfast/30 units with supper Current orders for Inpatient glycemic control:  Novolog sensitive tid with meals and HS  Inpatient Diabetes Program Recommendations:   While in the hospital, consider adding Levemir 15 units q HS and Novolog meal coverage 3 units tid with meals.  Text page sent to MD.  Will follow.   Thanks,  Adah Perl, RN, BC-ADM Inpatient Diabetes Coordinator Pager [phone] (8a-5p)

## 2017-11-17 NOTE — Progress Notes (Signed)
Patient states that she does not wear a CPAP/BIPAP at night.

## 2017-11-17 NOTE — Progress Notes (Signed)
Pt was seen for evaluation of mobility with O2 sats dropping to 77% with gait and on O2 quickly recovered to 94% in about 30 seconds.  May have reflected perfusion rather than a true desaturation to that level.  Pt was tired and with recliner following was able to sit and rest after the walk.  Her plan is to ask for SNF placement to progress her mobility in more controlled place as she is quite weak and will need walker, O2 and chair to mobilize currently.     11/17/17 1100  PT Visit Information  Last PT Received On 11/17/17  Assistance Needed +2  History of Present Illness 72 yo female with onset of HCAP and noted hallucinations, cardiomegaly and CHF was admitted for medical care.  Has hypokinesis of inferior myocardium.  On 5-6 L O2 in hosp, but PAF is controlled per MD notes.  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,   Precautions  Precautions Fall (teleemtry)  Precaution Comments weakness and low O2 sats even on supplemental O2 with mobility  Restrictions  Weight Bearing Restrictions No  Home Living  Family/patient expects to be discharged to: Private residence  Living Arrangements Spouse/significant other  Available Help at Discharge Family;Available 24 hours/day  Type of [place] One level  Tax adviser - 2 wheels  Prior Function  Level of [place] Needs assistance  Gait / Transfers Assistance Needed was on RW but pt cannot give details  ADL's / [place] home with husband  Communication  Communication Other (comment) (able to speak clearly but not accurate information)  Pain Assessment  Pain Assessment No/denies pain  Cognition  Arousal/Alertness Awake/alert  Behavior During Therapy Impulsive  Overall Cognitive Status No family/caregiver present to determine baseline cognitive functioning  General Comments pt is not clear on all history  details  Upper Extremity Assessment  Upper Extremity Assessment Generalized weakness  Lower Extremity Assessment  Lower Extremity Assessment Generalized weakness  Cervical / Trunk Assessment  Cervical / Trunk Assessment Kyphotic  Bed Mobility  General bed mobility comments up in chair when PT arrived  Transfers  Overall transfer level Needs assistance  Equipment used Rolling walker (2 wheeled);1 person hand held assist  Transfers Sit to/from Stand  Sit to Stand Min guard;Min assist  General transfer comment minor help to power up but then to steady her  Ambulation/Gait  Ambulation/Gait assistance Min guard  Gait Distance (Feet) 100 Feet  Assistive device Rolling walker (2 wheeled);1 person hand held assist  Gait Pattern/deviations Step-through pattern;Shuffle;Decreased stride length;Wide base of support  General Gait Details slow pivot turn then sat her down to ck O2 sats  Gait velocity reduced  Gait velocity interpretation <1.8 ft/sec, indicate of risk for recurrent falls  Balance  Overall balance assessment Needs assistance  Sitting-balance support Feet supported  Sitting balance-Leahy Scale Fair  Standing balance support Bilateral upper extremity supported;During functional activity  Standing balance-Leahy Scale Poor  Standing balance comment less than fair dynamic standing balance with static fair balance  General Comments  General comments (skin integrity, edema, etc.) Pt is on O2 4L for maintaining her sats but is not on air at home during the day.  Has stable O2 sats on room air briefly without mobility  Exercises  Exercises Other exercises (LE strenght is 4 to 4+ hips and knees)  PT - End of Session  Equipment Utilized During  Treatment Gait belt;Oxygen  Activity Tolerance Patient limited by fatigue;Treatment limited secondary to medical complications (Comment)  Patient left in chair;with call bell/phone within reach;with chair alarm set  Nurse Communication Mobility  status;Other (comment) (O2 sats dropping with mobility)  PT Assessment  PT Recommendation/Assessment Patient needs continued PT services  PT Visit Diagnosis Unsteadiness on feet (R26.81);Other abnormalities of gait and mobility (R26.89);Muscle weakness (generalized) (M62.81);Adult, failure to thrive (R62.7)  PT Problem List Decreased strength;Decreased range of motion;Decreased activity tolerance;Decreased balance;Decreased mobility;Decreased coordination;Decreased knowledge of use of DME;Decreased cognition;Decreased safety awareness;Cardiopulmonary status limiting activity;Obesity  Barriers to Discharge Inaccessible home environment;Decreased caregiver support  Barriers to Discharge Comments home with her older husband  PT Plan  PT Frequency (ACUTE ONLY) Min 2X/week  PT Treatment/Interventions (ACUTE ONLY) DME instruction;Gait training;Functional mobility training;Therapeutic activities;Therapeutic exercise;Balance training;Neuromuscular re-education;Patient/family education  AM-PAC PT "6 Clicks" Daily Activity Outcome Measure  Difficulty turning over in bed (including adjusting bedclothes, sheets and blankets)? 1  Difficulty moving from lying on back to sitting on the side of the bed?  1  Difficulty sitting down on and standing up from a chair with arms (e.g., wheelchair, bedside commode, etc,.)? 1  Help needed moving to and from a bed to chair (including a wheelchair)? 3  Help needed walking in hospital room? 3  Help needed climbing 3-5 steps with a railing?  2  6 Click Score 11  Mobility G Code  CL  PT Recommendation  Follow Up Recommendations SNF  PT equipment Rolling walker with 5" wheels (if she does not have one)  Individuals Consulted  Consulted and Agree with Results and Recommendations Patient  Acute Rehab PT Goals  Patient Stated Goal none stated  PT Goal Formulation With patient  Time For Goal Achievement 12/01/17  Potential to Achieve Goals Fair  PT Time Calculation  PT  Start Time (ACUTE ONLY) 1014  PT Stop Time (ACUTE ONLY) 1043  PT Time Calculation (min) (ACUTE ONLY) 29 min  PT G-Codes **NOT FOR INPATIENT CLASS**  Functional Assessment Tool Used AM-PAC 6 Clicks Basic Mobility  PT General Charges  $$ ACUTE PT VISIT 1 Visit  PT Evaluation  $PT Eval Moderate Complexity 1 Mod  PT Treatments  $Gait Training 8-22 mins    Mee Hives, PT MS Acute Rehab Dept. Number: [place]

## 2017-11-18 DIAGNOSIS — Z818 Family history of other mental and behavioral disorders: Secondary | ICD-10-CM

## 2017-11-18 DIAGNOSIS — K5900 Constipation, unspecified: Secondary | ICD-10-CM

## 2017-11-18 DIAGNOSIS — Z79899 Other long term (current) drug therapy: Secondary | ICD-10-CM

## 2017-11-18 DIAGNOSIS — R440 Auditory hallucinations: Secondary | ICD-10-CM

## 2017-11-18 DIAGNOSIS — M549 Dorsalgia, unspecified: Secondary | ICD-10-CM

## 2017-11-18 DIAGNOSIS — R441 Visual hallucinations: Secondary | ICD-10-CM

## 2017-11-18 DIAGNOSIS — F05 Delirium due to known physiological condition: Secondary | ICD-10-CM

## 2017-11-18 LAB — GLUCOSE, CAPILLARY
GLUCOSE-CAPILLARY: 248 mg/dL — AB (ref 70–99)
GLUCOSE-CAPILLARY: 262 mg/dL — AB (ref 70–99)
GLUCOSE-CAPILLARY: 88 mg/dL (ref 70–99)
Glucose-Capillary: 186 mg/dL — ABNORMAL HIGH (ref 70–99)
Glucose-Capillary: 113 mg/dL — ABNORMAL HIGH (ref 70–99)

## 2017-11-18 LAB — PROTIME-INR
INR: 1.55
Prothrombin Time: 18.4 seconds — ABNORMAL HIGH (ref 11.4–15.2)

## 2017-11-18 MED ORDER — WARFARIN SODIUM 7.5 MG PO TABS
7.5000 mg | ORAL_TABLET | Freq: Once | ORAL | Status: AC
  Administered 2017-11-18: 7.5 mg via ORAL
  Filled 2017-11-18: qty 1

## 2017-11-18 MED ORDER — RISPERIDONE 0.5 MG PO TABS
0.5000 mg | ORAL_TABLET | Freq: Every day | ORAL | Status: DC
  Administered 2017-11-18 – 2017-11-30 (×12): 0.5 mg via ORAL
  Filled 2017-11-18 (×14): qty 1

## 2017-11-18 MED ORDER — INSULIN ASPART 100 UNIT/ML ~~LOC~~ SOLN
2.0000 [IU] | Freq: Three times a day (TID) | SUBCUTANEOUS | Status: DC
  Administered 2017-11-19 – 2017-11-22 (×6): 2 [IU] via SUBCUTANEOUS

## 2017-11-18 MED ORDER — INSULIN DETEMIR 100 UNIT/ML ~~LOC~~ SOLN
10.0000 [IU] | Freq: Every day | SUBCUTANEOUS | Status: DC
  Administered 2017-11-18: 10 [IU] via SUBCUTANEOUS
  Filled 2017-11-18: qty 0.1

## 2017-11-18 MED ORDER — INSULIN DETEMIR 100 UNIT/ML ~~LOC~~ SOLN
15.0000 [IU] | Freq: Every day | SUBCUTANEOUS | Status: DC
  Administered 2017-11-20: 15 [IU] via SUBCUTANEOUS
  Filled 2017-11-18 (×4): qty 0.15

## 2017-11-18 MED ORDER — PRO-STAT SUGAR FREE PO LIQD
30.0000 mL | Freq: Two times a day (BID) | ORAL | Status: DC
  Administered 2017-11-18 – 2017-11-20 (×2): 30 mL via ORAL
  Filled 2017-11-18 (×4): qty 30

## 2017-11-18 NOTE — Progress Notes (Signed)
Occupational Therapy Evaluation Patient Details Name: Briana [name] MRN: [id number] DOB: [birth year] Today's Date: 11/18/2017    History of Present Illness 72 yo female with onset of HCAP and noted hallucinations, cardiomegaly and CHF was admitted for medical care.  Has hypokinesis of inferior myocardium.  On 5-6 L O2 in hosp, but PAF is controlled per MD notes.  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 Pt currently demonstrates decreased stability requiring modA for functional mobility at RW level, toilet transfers, and LB dressing. Pt is very tangential and was reporting hallucinations of rats in the room during session. Due to deficits listed below (see OT problem list), pt would benefit from acute OT to address establish goals to facilitate safe D/C. At this time, recommend SNF follow-up.      Follow Up Recommendations  SNF    Equipment Recommendations       Recommendations for Other Services       Precautions / Restrictions Precautions Precautions: Fall(teleemtry) Precaution Comments:  Restrictions Weight Bearing Restrictions: No      Mobility Bed Mobility               General bed mobility comments: sitting EOB upon arrival  Transfers Overall transfer level: Needs assistance Equipment used: Rolling walker (2 wheeled) Transfers: Sit to/from [employer] Transfers Sit to Stand: Mod assist Stand pivot transfers: Mod assist(VC for sequence/hand place physical assist for stability)       General transfer comment: modA for power up and stabilize in standing    Balance Overall balance assessment: Needs assistance Sitting-balance support: Feet supported;No upper extremity supported Sitting  balance-Leahy Scale: Fair     Standing balance support: Bilateral upper extremity supported;During functional activity Standing balance-Leahy Scale: Poor Standing balance comment: less than fair dynamic standing balance with static fair balance                           ADL either performed or assessed with clinical judgement   ADL Overall ADL's : Needs assistance/impaired Eating/Feeding: Set up;Sitting   Grooming: Min guard;Sitting   Upper Body Bathing: Min guard;Sitting   Lower Body Bathing: Moderate assistance;Sit to/from stand   Upper Body Dressing : Min guard;Sitting   Lower Body Dressing: Moderate assistance;Sit to/from stand Lower Body Dressing Details (indicate cue type and reason): pt was able to figure-4 LLE to adjust sock, unable to figure-4 RLE Toilet Transfer: Moderate assistance;Stand-pivot;RW;Cueing for sequencing Toilet Transfer Details (indicate cue type and reason): simulated with stand-pivot from EOB to recliner;pt required VC for sequencing of pivot and safe hand placement Toileting- Clothing Manipulation and Hygiene: Moderate assistance;Sit to/from stand       Functional mobility during ADLs: Moderate assistance;Rolling walker;Cueing for safety;Cueing for sequencing General ADL Comments: VC for redirection     Vision         Perception     Praxis      Pertinent Vitals/Pain Pain Assessment: No/denies pain     Hand Dominance Right   Extremity/Trunk Assessment Upper Extremity Assessment Upper Extremity Assessment: Generalized weakness   Lower Extremity Assessment Lower Extremity Assessment: Generalized weakness   Cervical / Trunk Assessment Cervical / Trunk Assessment: Kyphotic   Communication Communication Communication:  Other (comment)(able to speak clearly but not accurate information)   Cognition Arousal/Alertness: Awake/alert Behavior During Therapy: Impulsive Overall Cognitive Status: No family/caregiver present to  determine baseline cognitive functioning Area of Impairment: Attention;Following commands;Safety/judgement;Awareness;Problem solving                   Current Attention Level: Focused   Following Commands: Follows one step commands with increased time Safety/Judgement: Decreased awareness of deficits;Decreased awareness of safety Awareness: Intellectual Problem Solving: Requires verbal cues;Decreased initiation;Slow processing General Comments: pt is not clear on all history details;pt reports seeing mice in room;reports hallucinations have only been occurring for last 3 days, unable to identify that rats are not typically in hospital rooms;thoughts were tangential   General Comments  Pt is on O2 4lnc;pt reported rats in the room;VSS during session    Exercises     Shoulder Instructions      Home Living Family/patient expects to be discharged to:: Private residence Living Arrangements: Spouse/significant other Available Help at Discharge: Family;Available 24 hours/day Type of Home: House       Home Layout: One level         Bathroom Toilet: Standard     Home Equipment: Environmental consultant - 2 wheels   Additional Comments: Pt reports her husband has dementia      Prior Functioning/Environment Level of Independence: Needs assistance  Gait / Transfers Assistance Needed: was on RW but pt cannot give details ADL's / Homemaking Assistance Needed: reports independence with ADLs;sister-in law comes to help if needed   Comments: pt reports she was driving;        OT Problem List: Decreased range of motion;Decreased strength;Decreased activity tolerance;Impaired balance (sitting and/or standing);Decreased cognition;Decreased safety awareness      OT Treatment/Interventions: Self-care/ADL training;Therapeutic exercise;Energy conservation;DME and/or AE instruction;Therapeutic activities;Cognitive remediation/compensation;Patient/family education;Balance training    OT  Goals(Current goals can be found in the care plan section) Acute Rehab OT Goals Patient Stated Goal: none stated  OT Frequency: Min 2X/week   Barriers to D/C: Decreased caregiver support  husband has dementia;pt unable to report if assist is available 24/7       Co-evaluation              AM-PAC PT "6 Clicks" Daily Activity     Outcome Measure Help from another person eating meals?: None Help from another person taking care of personal grooming?: A Little Help from another person toileting, which includes using toliet, bedpan, or urinal?: A Little Help from another person bathing (including washing, rinsing, drying)?: A Little Help from another person to put on and taking off regular upper body clothing?: A Little Help from another person to put on and taking off regular lower body clothing?: A Little 6 Click Score: 19   End of Session Equipment Utilized During Treatment: Gait belt;Rolling walker;Oxygen Nurse Communication: Mobility status  Activity Tolerance: Patient tolerated treatment well Patient left: in chair;with call bell/phone within reach;with chair alarm set  OT Visit Diagnosis: Other abnormalities of gait and mobility (R26.89);Unsteadiness on feet (R26.81);Muscle weakness (generalized) (M62.81);Other symptoms and signs involving cognitive function                Time: 1535-1559 OT Time Calculation (min): 24 min Charges:    G-Codes:     Dorinda Hill OTS    Dorinda Hill 11/18/2017, 5:13 PM

## 2017-11-18 NOTE — Progress Notes (Addendum)
[facility] Progress Note   Overview: Briana [name] is a 72 y.o. female with ESRD, CAD (s/p CABG, Hx ICD), A-fib (on warfarin), carotid disease (s/p B endarterectomy), dCHF, Type 2 DM, PVD, and OSA who was been admitted with dyspnea and visual hallucinations.   Subjective:  Seen in room. Remains on nasal oxygen and PT tells me that she has been having more visual hallucinations (seeing rats running all over her room). No CP.  Objective Vitals:   11/18/17 0244 11/18/17 0437 11/18/17 0847 11/18/17 1535  BP: (!) 125/45 (!) 116/53 (!) 116/57 100/62  Pulse: 61 63 62 61  Resp: 18  20 (!) 22  Temp: 98.2 F (36.8 C) 98.2 F (36.8 C) 99.6 F (37.6 C) 98.7 F (37.1 C)  TempSrc: Oral Oral Oral Oral  SpO2: 98% 94% 95% 94%  Weight: 100.1 kg (220 lb 10.9 oz) 101.3 kg (223 lb 5.2 oz)    Height:       Physical Exam General: Well developed, NAD. On nasal oxygen Heart: RRR; no murmur Lungs: Bibasilar rales Extremities: 2+ pitting LE edema Dialysis Access: L AVF + thrill  Additional Objective Labs: Basic Metabolic Panel: Recent Labs  Lab 11/15/17 2040 11/16/17 0551 11/17/17 0148  NA 133* 138 135  K 4.7 4.3 4.6  CL 92* 96* 92*  CO2 29 32 29  GLUCOSE 446* 246* 294*  BUN 23* 24* 36*  CREATININE 3.77* 3.98* 5.16*  CALCIUM 8.7* 8.8* 8.6*  PHOS  --   --  4.7*   Liver Function Tests: Recent Labs  Lab 11/15/17 2040 11/16/17 0551 11/17/17 0148  AST 24 22  --   ALT 17 18  --   ALKPHOS 117 121  --   BILITOT 1.1 1.2  --   PROT 6.9 6.8  --   ALBUMIN 3.6 3.4* 3.2*   CBC: Recent Labs  Lab 11/15/17 2040 11/16/17 0551 11/17/17 0149  WBC 6.6 6.3 7.8  HGB 12.6 12.1 11.7*  HCT 40.9 39.8 39.1  MCV 100.7* 99.7 101.8*  PLT 115* 133* 146*   Studies/Results: Dg Chest Port 1 View  Result Date: 11/17/2017 CLINICAL DATA:  Hypoxia. EXAM: PORTABLE CHEST 1 VIEW COMPARISON:  November 15, 2016 FINDINGS: Stable cardiomegaly. The hila and mediastinum are unremarkable. Effusion  and opacity in left base is stable. Mild pulmonary venous congestion. Mild fluid in the right-sided fissure. No other interval changes. IMPRESSION: 1. The small left effusion and underlying opacity is stable. 2. Mild pulmonary venous congestion. Mild fluid in the right minor fissure. These findings are stable as well. Electronically Signed   By: Dorise Bullion III M.D   On: 11/17/2017 12:43   Medications: . sodium chloride 10 mL/hr at 11/16/17 0919  . piperacillin-tazobactam (ZOSYN)  IV 3.375 g (11/18/17 0916)   . aspirin EC  81 mg Oral Daily  . atorvastatin  80 mg Oral Daily  . Chlorhexidine Gluconate Cloth  6 each Topical Q0600  . ferric citrate  210 mg Oral TID WC  . insulin aspart  0-9 Units Subcutaneous TID WC  . insulin detemir  10 Units Subcutaneous Daily  . metoprolol tartrate  50 mg Oral BID  . risperiDONE  0.5 mg Oral QHS  . rOPINIRole  0.25 mg Oral QHS  . sertraline  200 mg Oral Daily  . warfarin  7.5 mg Oral ONCE-1800  . Warfarin - Pharmacist Dosing Inpatient   Does not apply q1800   Dialysis Orders: TTS at [facility], 400/A1.5, EDW 100.5kg,  2K/2.5Ca, bicarb 35, AVF, no heparin.  Assessment/Plan: 1.  Dyspnea/pulm edema: S/p HD 6/23 and 6/24. On IV Vanc/Zosyn to cover for possible pneumonia. 2.  Visual hallucinations: Head CT without acute findings, ?possible old CVA. Per primary.  3.  ESRD: Last dialyzed overnight to early this morning. Next HD tomorrow, off shift, then back to usual schedule by end of the week. 4.  Hypertension/volume: BP ok, still edematous. Will need to continue to challenge EDW. 5.  Anemia: Hgb 11.7, no ESA needs for now. 6.  Metabolic bone disease: Ca/Phos ok, continue home binder. 7.  Nutrition: Alb low, adding pro-stat supplement. 8.  CAD (s/p CABG/ICD): Trop 0.2, per primary. 9.  Type 2 DM: On insulin, per primary. 10.  PVD 11.   A-fib (on warfarin)   Veneta Penton, PA-C 11/18/2017, 4:07 PM  [facility] Pager:  [phone]  Pt seen, examined and agree w A/P as above.  Kelly Splinter MD [employer] pager [phone]   11/18/2017, 4:32 PM

## 2017-11-18 NOTE — Progress Notes (Signed)
ANTICOAGULATION CONSULT NOTE - Follow-Up Consult  Pharmacy Consult for Coumadin Indication: atrial fibrillation  Patient Measurements: Height: 5\' 4"  (162.6 cm) Weight: 223 lb 5.2 oz (101.3 kg) IBW/kg (Calculated) : 54.7  Vital Signs: Temp: 99.6 F (37.6 C) (06/25 0847) Temp Source: Oral (06/25 0847) BP: 116/57 (06/25 0847) Pulse Rate: 62 (06/25 0847)  Labs: Recent Labs    11/15/17 2040 11/16/17 0551 11/17/17 0148 11/17/17 0149 11/17/17 1048 11/18/17 0420  HGB 12.6 12.1  --  11.7*  --   --   HCT 40.9 39.8  --  39.1  --   --   PLT 115* 133*  --  146*  --   --   LABPROT  --  21.8*  --   --  20.6* 18.4*  INR  --  1.92  --   --  1.79 1.55  CREATININE 3.77* 3.98* 5.16*  --   --   --     Estimated Creatinine Clearance: 11.4 mL/min (A) (by C-G formula based on SCr of 5.16 mg/dL (H)).  Assessment: 72 y.o. female admitted with PNA, h/o Afib, to continue warfarin. Admit INR 1.92 on PTA dose - last dose noted to be 6/21. Pharmacy consulted to dose this admission.    PTA dose of warfarin noted to be 5mg  all days except 2.5mg  on Sundays.   INR today remains SUBtherapeutic and trending down (1.55 << 1.79, goal of 2-3). Hgb/Hct slight drop, plts 146  Goal of Therapy:  INR 2-3 Monitor platelets by anticoagulation protocol: Yes   Plan:  Warfarin 7.5 mg x 1 dose at 1800 today Daily INR/ CBC  Monitor for s/sx of bleeding   Thank you for allowing pharmacy to be a part of this patient's care.  Alycia Rossetti, PharmD, BCPS Clinical Pharmacist Pager: [phone] Clinical phone for 11/18/2017 from 7a-3:30p: [phone] If after 3:30p, please call main pharmacy at: [phone] 11/18/2017 10:41 AM

## 2017-11-18 NOTE — Progress Notes (Signed)
Pt tele sitter and bed  going off entered room patient standing at bedside very angry stating she see's her daughter no daughter present in the room patient charges out of the room disconnecting telemetry and IV disconnected by nurse to prevent dislodgement patient continued to push room furniture into the hallway and down the unit attempting to go into other patients room Charge nurse notified and MD paged to request 1:1 sitter

## 2017-11-18 NOTE — Consult Note (Addendum)
[facility] Face-to-Face Psychiatry Consult   Reason for Consult:  Hallucinations  Referring Physician:  Dr. Karleen Hampshire Patient Identification: Briana [name] MRN:  [id number] Principal Diagnosis: Delirium due to another medical condition Diagnosis:   Patient Active Problem List   Diagnosis Date Noted  . HCAP (healthcare-associated pneumonia) [J18.9] 11/15/2017  . Encounter for therapeutic drug monitoring [Z51.81] 02/05/2017  . Asymptomatic stenosis of left carotid artery [I65.22] 12/18/2016  . Symptomatic bradycardia [R00.1] 11/05/2016  . 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 Paroxysmal atrial flutter ([place]) [I48.92] 10/15/2016  . Bradycardia [R00.1] 10/09/2016  . Personal history of noncompliance with medical treatment, presenting hazards to health [Z91.19] 09/17/2016  . Essential hypertension, benign [I10]   . Anemia of chronic kidney failure [N18.9, D63.1]   . Chronic diastolic heart failure ([place]) [I50.32] 11/10/2015  . Depression [F32.9]   . Hypoalbuminemia due to protein-calorie malnutrition ([place]) [E46]   . Debility [R53.81] 10/18/2015  . Abnormality of gait [R26.9]   . Long term current use of anticoagulant therapy [Z79.01]   . Chronic pain syndrome [G89.4]   . Hx of CABG 2005 [Z95.1]   . Hx of non-ST elevation myocardial infarction (NSTEMI) [I25.2]   . Paroxysmal atrial fibrillation (HCC) [I48.0]   . 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 ESRD (end stage renal disease) on dialysis ([place]) [N18.6, Z99.2]   . AKI (acute kidney injury) ([place]) [N17.9]   . Non-ST elevation myocardial infarction (NSTEMI) ([place]) [I21.4] 10/14/2015  . Pulmonary edema [J81.1]   . Abnormal nuclear stress test [R94.39]   . Acute renal failure with tubular necrosis (HCC) [N17.0]   . Hypertensive emergency [I16.1]   . CAP (community acquired pneumonia) [J18.9]   . CAD in native artery [I25.10]   . Pulmonary hypertension ([place]) [I27.20]   . Atrial  fibrillation with RVR ([place]) [I48.91]   . Acute on chronic renal failure (HCC) [N17.9, N18.9]   . Physical deconditioning [R53.81]   . Acute respiratory failure with hypoxia ([place]) [J96.01]   . Acute pulmonary edema (HCC) [J81.0]   . Acute blood loss anemia [D62]   . Acute respiratory failure ([place]) [J96.00] 10/01/2015  . Hypoxia [R09.02] 10/01/2015  . Elevated troponin [R74.8] 10/01/2015  . Chest pain [R07.9]   . DVT (deep venous thrombosis) ([place]) [I82.409]   . Hypoxemia [R09.02]   . Mixed hyperlipidemia [E78.2] 03/28/2015  . Vitamin D deficiency [E55.9] 03/28/2015  . Acute renal failure ([place]) [N17.9] 03/04/2014  . UTI (lower urinary tract infection) [N39.0] 03/02/2014  . Acute encephalopathy [G93.40] 03/02/2014  . Dehydration [E86.0] 03/02/2014  . Generalized weakness [R53.1] 03/02/2014  . Obesity [E66.9] 03/02/2014  . Depressed mood [F32.9] 03/02/2014  . Hx of adenomatous colonic polyps [Z86.010] 01/28/2012  . Occlusion and stenosis of carotid artery without mention of cerebral infarction [I65.29] 12/03/2011    Total Time spent with patient: 1 hour  Subjective:   Briana [name] is a 72 y.o. female patient admitted with HCAP.  HPI:   Per chart review, patient was admitted with acute respiratory failure with hypoxia secondary to fluid overload from acute on chronic diastolic heart failure and possible HCAP. Psychiatry was consulted for visual hallucinations for the past day. Home medications include Zoloft 200 mg daily and Valium 5 mg TID PRN for anxiety.   On interview, Briana [name] reports onset of VH of snakes, rats and little girls since Saturday.  She continues to endorse AVH since hospitalization.  Her daughter was at bedside with her verbal consent.  Her daughter reports that she has been seeing birds, butterflies and babies.  She also has been somewhat paranoid.  She endorses AH of birds singing.  Briana [name] reports understanding that these perceptual disturbances are  not real.  She reports a similar episode in the past when she was admitted to the hospital after having a heart attack.  She does endorse depression in the setting of caring for her husband with Alzheimer's disease.  Her daughter reports that she is in "survival mode."  She reports that pain contributes to her mood.  She has Norco ordered as a PRN medication but she has not received it for the past 2 days.  She also takes Valium regularly at home although it is ordered as a PRN medication.  Her daughter believes that she has been more restless.  She denies SI and reports, "I love myself."  She denies HI.  She denies problems with sleep or appetite.  She is oriented to person, place and month/year.  She believes that she is 72 y/o although she knows her birthday.   Past Psychiatric History: Depression and anxiety  Risk to Self:  None. Denies SI. Risk to Others:  None. Denies HI. Prior Inpatient Therapy:  Denies  Prior Outpatient Therapy:  She is followed by her PCP.  Past Medical History:  Past Medical History:  Diagnosis Date  . Adenomatous colon polyp 09/2007  . Arthritis   . Atrial fibrillation with rapid ventricular response ([place]) 10/05/2015  . Bradycardia   . CAD (coronary artery disease)    a. s/p CABG 2005.  . Carotid artery disease ([place])    a. s/p R CEA 2011. Followed by vascular -  duplex 04/16/16: <21% RICA, 30-86% LICA.  Marland Kitchen Chronic anemia   . Chronic diastolic CHF (congestive heart failure) ([place])   . Depression with anxiety   . Diabetes mellitus   . ESRD (end stage renal disease) ([place])    T Th Sat  . Heart murmur   . Hyperlipidemia   . Hypertension   . Neuropathy   . PAD (peripheral artery disease) ([place])   . Paroxysmal atrial flutter ([place])   . Peripheral vascular disease ([place])   . Pulmonary hypertension ([place])   . Rheumatic fever   . Sleep apnea     Past Surgical History:  Procedure Laterality Date  . BASCILIC VEIN TRANSPOSITION Left 12/04/2015   Procedure: BASILIC VEIN  TRANSPOSITION;  Surgeon: Rosetta Posner, MD;  Location: [facility];  Service: Vascular;  Laterality: Left;  . CAROTID ENDARTERECTOMY  10/04/2009   Right CEA  . CORONARY ARTERY BYPASS GRAFT  2005  . ENDARTERECTOMY Left 12/18/2016   Procedure: ENDARTERECTOMY CAROTID;  Surgeon: Rosetta Posner, MD;  Location: [facility];  Service: Vascular;  Laterality: Left;  . HEMATOMA EVACUATION Left 12/18/2016   Procedure: EVACUATION of left neck HEMATOMA;  Surgeon: Elam Dutch, MD;  Location: [facility];  Service: Vascular;  Laterality: Left;  . PACEMAKER IMPLANT N/A 11/06/2016   Procedure: Pacemaker Implant;  Surgeon: Evans Lance, MD;  Location: [facility];  Service: Cardiovascular;  Laterality: N/A;  . PATCH ANGIOPLASTY Left 12/18/2016   Procedure: PATCH ANGIOPLASTY WITH HEMASHIELD PLATIUM FINESSE PATCH;  Surgeon: Rosetta Posner, MD;  Location: [facility];  Service: Vascular;  Laterality: Left;  . PERIPHERAL VASCULAR CATHETERIZATION N/A 06/14/2016   Procedure: A/V Fistulagram - Left Upper;  Surgeon: Elam Dutch, MD;  Location: [facility];  Service: Cardiovascular;  Laterality: N/A;  .  POLYPECTOMY  02/25/2012   Procedure: POLYPECTOMY;  Surgeon: Danie Binder, MD;  Location: [facility];  Service: Endoscopy;  Laterality: N/A;  . TUBAL LIGATION     Family History:  Family History  Problem Relation Age of Onset  . Breast cancer Mother   . Heart disease Father   . Diabetes Father   . Heart disease Brother    Family Psychiatric  History: Granddaughter committed suicide by drug overdose in 2014.  Social History:  Social History   Substance and Sexual Activity  Alcohol Use No  . Alcohol/week: 0.0 oz     Social History   Substance and Sexual Activity  Drug Use No    Social History   Socioeconomic History  . Marital status: Married    Spouse name: Not on file  . Number of children: Not on file  . Years of education: Not on file  . Highest education level: Not on file  Occupational History  . Not  on file  Social Needs  . Financial resource strain: Not on file  . Food insecurity:    Worry: Not on file    Inability: Not on file  . Transportation needs:    Medical: Not on file    Non-medical: Not on file  Tobacco Use  . Smoking status: Never Smoker  . Smokeless tobacco: Never Used  Substance and Sexual Activity  . Alcohol use: No    Alcohol/week: 0.0 oz  . Drug use: No  . Sexual activity: Not on file  Lifestyle  . Physical activity:    Days per week: Not on file    Minutes per session: Not on file  . Stress: Not on file  Relationships  . Social connections:    Talks on phone: Not on file    Gets together: Not on file    Attends religious service: Not on file    Active member of club or organization: Not on file    Attends meetings of clubs or organizations: Not on file    Relationship status: Not on file  Other Topics Concern  . Not on file  Social History Narrative  . Not on file   Additional Social History: She is married. She has one daughter.     Allergies:   Allergies  Allergen Reactions  . Tape Other (See Comments)    TAPE WILL CAUSE BLISTERS/SORES; PLEASE USE COBAN WRAP!!  . Gemfibrozil Other (See Comments)    Patient is not aware of this allergy but states that she had side effects to a cholesterol medication in the past  . Oxycodone Other (See Comments)    Hallucinations   . Carbamazepine Other (See Comments)    Reaction to tegretol - "loopy"  . Clonazepam Other (See Comments)    Sleepy   . Macrobid [Nitrofurantoin] Nausea And Vomiting    Labs:  Results for orders placed or performed during the hospital encounter of 11/15/17 (from the past 48 hour(s))  Glucose, capillary     Status: Abnormal   Collection Time: 11/16/17 12:45 PM  Result Value Ref Range   Glucose-Capillary 290 (H) 65 - 99 mg/dL  Glucose, capillary     Status: Abnormal   Collection Time: 11/16/17  4:41 PM  Result Value Ref Range   Glucose-Capillary 263 (H) 65 - 99 mg/dL   Glucose, capillary     Status: Abnormal   Collection Time: 11/16/17  9:10 PM  Result Value Ref Range   Glucose-Capillary 252 (H) 65 - 99  mg/dL  Renal function panel     Status: Abnormal   Collection Time: 11/17/17  1:48 AM  Result Value Ref Range   Sodium 135 135 - 145 mmol/L   Potassium 4.6 3.5 - 5.1 mmol/L   Chloride 92 (L) 101 - 111 mmol/L   CO2 29 22 - 32 mmol/L   Glucose, Bld 294 (H) 65 - 99 mg/dL   BUN 36 (H) 6 - 20 mg/dL   Creatinine, Ser 5.16 (H) 0.44 - 1.00 mg/dL   Calcium 8.6 (L) 8.9 - 10.3 mg/dL   Phosphorus 4.7 (H) 2.5 - 4.6 mg/dL   Albumin 3.2 (L) 3.5 - 5.0 g/dL   GFR calc non Af Amer 8 (L) >60 mL/min   GFR calc Af Amer 9 (L) >60 mL/min    Comment: (NOTE) The eGFR has been calculated using the CKD EPI equation. This calculation has not been validated in all clinical situations. eGFR's persistently <60 mL/min signify possible Chronic Kidney Disease.    Anion gap 14 5 - 15    Comment: Performed at [facility] [address] [place], [place] [address]  CBC     Status: Abnormal   Collection Time: 11/17/17  1:49 AM  Result Value Ref Range   WBC 7.8 4.0 - 10.5 K/uL   RBC 3.84 (L) 3.87 - 5.11 MIL/uL   Hemoglobin 11.7 (L) 12.0 - 15.0 g/dL   HCT 39.1 36.0 - 46.0 %   MCV 101.8 (H) 78.0 - 100.0 fL   MCH 30.5 26.0 - 34.0 pg   MCHC 29.9 (L) 30.0 - 36.0 g/dL   RDW 15.9 (H) 11.5 - 15.5 %   Platelets 146 (L) 150 - 400 K/uL    Comment: Performed at [facility], [place] [address], [place], [place] [address]  Hepatitis B surface antigen     Status: None   Collection Time: 11/17/17  1:49 AM  Result Value Ref Range   Hepatitis B Surface Ag Negative Negative    Comment: (NOTE) Faxed @ 5:53a 11/17/17 Performed At: [facility] [place], [place] [id number] Rush Farmer MD [id number] Performed at [facility], [place] [address], [place], [place] [address]   Hepatitis B surface antibody     Status: None   Collection Time: 11/17/17   1:49 AM  Result Value Ref Range   Hep B S Ab Reactive     Comment: (NOTE)              Non Reactive: Inconsistent with immunity,                            less than 10 mIU/mL              Reactive:     Consistent with immunity,                            greater than 9.9 mIU/mL Performed At: [facility] [place], [place] [id number] Rush Farmer MD [id number] Performed at [facility], [place] [address] [place], [place] [address]   Glucose, capillary     Status: Abnormal   Collection Time: 11/17/17  8:04 AM  Result Value Ref Range   Glucose-Capillary 262 (H) 65 - 99 mg/dL  Protime-INR     Status: Abnormal   Collection Time: 11/17/17 10:48 AM  Result Value Ref Range  Prothrombin Time 20.6 (H) 11.4 - 15.2 seconds   INR 1.79     Comment: Performed at [facility], [place] [address], [place], [place] [address]  Glucose, capillary     Status: Abnormal   Collection Time: 11/17/17 11:06 AM  Result Value Ref Range   Glucose-Capillary 273 (H) 65 - 99 mg/dL  Glucose, capillary     Status: Abnormal   Collection Time: 11/17/17  5:19 PM  Result Value Ref Range   Glucose-Capillary 158 (H) 65 - 99 mg/dL  Glucose, capillary     Status: Abnormal   Collection Time: 11/17/17  8:44 PM  Result Value Ref Range   Glucose-Capillary 166 (H) 65 - 99 mg/dL  Protime-INR     Status: Abnormal   Collection Time: 11/18/17  4:20 AM  Result Value Ref Range   Prothrombin Time 18.4 (H) 11.4 - 15.2 seconds   INR 1.55     Comment: Performed at [facility], [place] [address] [place], [place] [address]  Glucose, capillary     Status: Abnormal   Collection Time: 11/18/17  7:31 AM  Result Value Ref Range   Glucose-Capillary 186 (H) 70 - 99 mg/dL    Current Facility-Administered Medications  Medication Dose Route Frequency Provider Last Rate Last Dose  . 0.9 %  sodium chloride infusion   Intravenous PRN Hosie Poisson, MD 10 mL/hr at 11/16/17 0919    . aspirin EC  tablet 81 mg  81 mg Oral Daily Hosie Poisson, MD   81 mg at 11/18/17 0909  . atorvastatin (LIPITOR) tablet 80 mg  80 mg Oral Daily Hosie Poisson, MD   80 mg at 11/18/17 0908  . Chlorhexidine Gluconate Cloth 2 % PADS 6 each  6 each Topical Q0600 Loren Racer, PA-C   6 each at 11/18/17 0442  . diazepam (VALIUM) tablet 5 mg  5 mg Oral TID PRN Hosie Poisson, MD   5 mg at 11/16/17 0852  . ferric citrate (AURYXIA) tablet 210 mg  210 mg Oral TID WC Hosie Poisson, MD   210 mg at 11/18/17 0908  . HYDROcodone-acetaminophen (NORCO/VICODIN) 5-325 MG per tablet 1 tablet  1 tablet Oral Q6H PRN Hosie Poisson, MD   1 tablet at 11/16/17 0907  . insulin aspart (novoLOG) injection 0-9 Units  0-9 Units Subcutaneous TID WC Hosie Poisson, MD   2 Units at 11/18/17 0908  . insulin detemir (LEVEMIR) injection 10 Units  10 Units Subcutaneous Daily Hosie Poisson, MD   10 Units at 11/18/17 1037  . ipratropium-albuterol (DUONEB) 0.5-2.5 (3) MG/3ML nebulizer solution 3 mL  3 mL Nebulization Q2H PRN Hosie Poisson, MD      . metoprolol tartrate (LOPRESSOR) tablet 50 mg  50 mg Oral BID Hosie Poisson, MD   50 mg at 11/18/17 0908  . piperacillin-tazobactam (ZOSYN) IVPB 3.375 g  3.375 g Intravenous Q12H Hosie Poisson, MD 12.5 mL/hr at 11/18/17 0916 3.375 g at 11/18/17 0916  . rOPINIRole (REQUIP) tablet 0.25 mg  0.25 mg Oral QHS Hosie Poisson, MD   0.25 mg at 11/16/17 2225  . sertraline (ZOLOFT) tablet 200 mg  200 mg Oral Daily Hosie Poisson, MD   200 mg at 11/18/17 0908  . warfarin (COUMADIN) tablet 7.5 mg  7.5 mg Oral ONCE-1800 Rolla Flatten, [facility]      . Warfarin - Pharmacist Dosing Inpatient   Does not apply [id number] Hosie Poisson, MD        Musculoskeletal: Strength & Muscle Tone: within  normal limits Gait & Station: UTA since patient was lying in bed. Patient leans: N/A  Psychiatric Specialty Exam: Physical Exam  Nursing note and vitals reviewed. Constitutional: She is oriented to person, place, and time. She  appears well-developed and well-nourished.  HENT:  Head: Normocephalic and atraumatic.  Neck: Normal range of motion.  Respiratory: Effort normal.  Musculoskeletal: Normal range of motion.  Neurological: She is alert and oriented to person, place, and time.  Skin: No rash noted.  Psychiatric: Her speech is normal and behavior is normal. Judgment and thought content normal. Cognition and memory are normal. She exhibits a depressed mood.    Review of Systems  Cardiovascular: Negative for chest pain.  Gastrointestinal: Positive for constipation. Negative for abdominal pain, diarrhea, nausea and vomiting.  Musculoskeletal: Positive for back pain.  Psychiatric/Behavioral: Positive for depression and hallucinations (AVH). Negative for substance abuse and suicidal ideas. The patient does not have insomnia.   All other systems reviewed and are negative.   Blood pressure (!) 116/57, pulse 62, temperature 99.6 F (37.6 C), temperature source Oral, resp. rate 20, height _0  (1.626 m), weight 101.3 kg (223 lb 5.2 oz), SpO2 95 %.Body mass index is 38.33 kg/m.  General Appearance: Fairly Groomed, elderly, obese, Caucasian female, wearing a hospital gown with short red hair who is sitting in a chair. NAD.   Eye Contact:  Good  Speech:  Clear and Coherent and Normal Rate  Volume:  Normal  Mood:  Depressed  Affect:  Appropriate and Full Range  Thought Process:  Goal Directed, Linear and Descriptions of Associations: Intact  Orientation:  Full (Time, Place, and Person)  Thought Content:  Logical and Hallucinations: Auditory Visual  Suicidal Thoughts:  No  Homicidal Thoughts:  No  Memory:  Immediate;   Fair Recent;   Good Remote;   Good  Judgement:  Fair  Insight:  Fair  Psychomotor Activity:  Normal  Concentration:  Concentration: Good and Attention Span: Good  Recall:  Good  Fund of Knowledge:  Good  Language:  Good  Akathisia:  No  Handed:  Right  AIMS (if indicated):   N/A  Assets:   Communication Skills Housing Intimacy Social Support  ADL's:  Intact  Cognition:  WNL  Sleep:   Okay   Assessment:  Briana [name] is a 72 y.o. female who was admitted with acute respiratory failure with hypoxia secondary to fluid overload from acute on chronic diastolic heart failure and HCAP. She reports intermittent AVH with onset of current medical condition. She also reports one prior episode while hospitalized after a heart attack. Her presentation is likely attributed to delirium secondary to medical condition. She also has not been receiving Valium and Norco since they are ordered as PRN medications. She reports regularly taking them at home so withdrawal from these medications may be contributing to worsening mental status as noticed by her daughter with increased restlessness and confusion/paranoia.   Treatment Plan Summary: -Start Risperdal 0.5 mg daily for psychosis. May switch to nighttime dosing if causes sedation. Recommend short term use until symptoms resolve given increased risk for metabolic disturbances and EPS. -Please have unit SW provide patient with resources for therapists. -Continue Zoloft 200 mg daily for depression and anxiety and Valium 5 mg TID PRN for anxiety.  -Patient should follow up with her PCP for further medication management.  -Psychiatry will sign off on patient at this time. Please consult psychiatry again as needed.   Disposition: No evidence of imminent risk to  self or others at present.   Patient does not meet criteria for psychiatric inpatient admission.  Faythe Dingwall, DO 11/18/2017 11:19 AM

## 2017-11-18 NOTE — Clinical Social Work Note (Signed)
Clinical Social Work Assessment  Patient Details  Name: Briana [name] MRN: [id number] Date of Birth: 11-Oct-1945  Date of referral:  11/18/17               Reason for consult:  Facility Placement                Permission sought to share information with:  Chartered certified accountant granted to share information::  Yes, Verbal Permission Granted  Name::     Surveyor, minerals::  SNF/ CIR  Relationship::  dtr  Contact Information:     Housing/Transportation Living arrangements for the past 2 months:  [place] of Information:  Patient, Adult Children Patient Interpreter Needed:  None Criminal Activity/Legal Involvement Pertinent to Current Situation/Hospitalization:  No - Comment as needed Significant Relationships:  Adult Children, Spouse Lives with:  Spouse Do you feel safe going back to the place where you live?  No Need for family participation in patient care:  Yes (Comment)(decision making at this time)  Care giving concerns:  Pt lives at home with spouse.  Spouse is somewhat disabled physically and has dementia- pt is primary caregiver for spouse but now with her own impairments- does not have sufficient help at home to return safely.   Social Worker assessment / plan:  CSW spoke with pt and pt dtr concerning PT recommendation for SNF.  Pt alert but not fully oriented- somewhat distracted during conversation and did not respond appropriately to questions- most of interview conducted with dtr at bedside.    CSW explained recommendation for SNF and SNF referral process.  Pt gets dialysis in [place], [place] and she normally drives herself.    Pt is the usual caregiver for demented spouse.  Per dtr she stops by 2x day to check on pt spouse and other family members are also dropping in multiple times a day to ensure his safety while pt is in hospital.  Employment status:  Retired Forensic scientist:  Medicare PT Recommendations:  [place] / Referral to community resources:  [place]  Patient/Family's Response to care:  Dtr agreeable for CSW to look into SNF but would much prefer CIR program where pt has been in the past.  Thinks pt would do better in a hospital setting and would help her get home quicker.  Patient/Family's Understanding of and Emotional Response to Diagnosis, Current Treatment, and Prognosis:  No questions or concerns at this time- dtr hopeful that pt mental status will improve soon- very concerned with hallucinations.  Emotional Assessment Appearance:  Appears stated age Attitude/Demeanor/Rapport:  Inconsistent Affect (typically observed):    Orientation:  Oriented to Self Alcohol / Substance use:  Not Applicable Psych involvement (Current and /or in the community):  Yes (Comment)(consult for hallucinations)  Discharge Needs  Concerns to be addressed:  Care Coordination Readmission within the last 30 days:  No Current discharge risk:  Physical Impairment Barriers to Discharge:  Continued Medical Work up   Jorge Ny, LCSW 11/18/2017, 4:04 PM

## 2017-11-18 NOTE — Care Management Important Message (Signed)
Important Message  Patient Details  Name: Briana [name] MRN: [id number] Date of Birth: 1946-03-28   Medicare Important Message Given:       Orbie Pyo 11/18/2017, 3:32 PM

## 2017-11-18 NOTE — Progress Notes (Signed)
PROGRESS NOTE    Briana [name]  XKG:[id number] DOB: 1945/10/12 DOA: 11/15/2017 PCP: Vidal Schwalbe, MD    Brief Narrative:  Briana [place] a 72 y.o.femalewith medical history significant forend-stage renal disease on dialysis Tuesday Thursday Saturday, nocturnal hypoxia on 2 L of oxygen at night, coronary artery disease status post CABG, paroxysmal A. fib on Coumadin, post AICD placement, chronic diastolic CHF, pulmonary hypertension, who presented to ED [facility] from home accompanied by her daughter due to persistent visual hallucinations.Onset today. CT head no contrast done in the ED revealed possible old infarct in the region of the right posterior occipital lobe. At the time of this evaluation patient was alert and oriented x3 and stated that she was no longer having hallucinations.   Chest x-ray on presentation revealed left lower lobe infiltrates versus atelectasis and increase in pulmonary vascularity with suspicion for fluid overload. Repeat CXR shows improvement.     Assessment & Plan:   Active Problems:   Mixed hyperlipidemia   Pulmonary hypertension (HCC)   Physical deconditioning   Debility   Hx of CABG 2005   Paroxysmal atrial fibrillation (HCC)   Diabetes mellitus with end-stage renal disease ([place])   ESRD (end stage renal disease) on dialysis ([place])   Depression   Essential hypertension, benign   HCAP (healthcare-associated pneumonia)  Acute respiratory failure with hypoxia secondary to fluid overload from possibly acute on chronic diastolic heart failure and possibly HCAP .  She had recent echocardiogram showing diastolic dysfunction with hypokinesis of the inferior myocardium.  Transferred to step down,. Plan to wean her from  5 lit of [place] to 2 lit in the next 24 hours.  Continue with zosyn for another 24 hours and transition to oral antibiotics to complete the course.  Blood cultures not done on admission.  Fluid management as per HD.   ESRD ON  HD  Further management as per renal.    Paroxysmal atrial fibrillation;  Rate controlled . And on coumadin for anticoagulation.     CAD s/p cabg; - pt denies any chest pain.  Resume home meds.     Visual hallucinations with hospital acquired delirium.  On valium at home , psychiatry consulted, recommended short term risperidone.     DVT prophylaxis: coumadin.  Code Status: full code.  Family Communication: none at bedside.  Disposition Plan: pending resolution of respiratory failure.   Consultants:   Nephrology.    Procedures: hd   Antimicrobials: zosyn since admission.    Subjective:  reports feeling better, no chest pain or sob. Still on 5lit of [place] oxygen. Wants to know when she can go home.   Objective: Vitals:   11/18/17 0230 11/18/17 0244 11/18/17 0437 11/18/17 0847  BP: (!) 119/50 (!) 125/45 (!) 116/53 (!) 116/57  Pulse: 60 61 63 62  Resp: 17 18  20   Temp:  98.2 F (36.8 C) 98.2 F (36.8 C) 99.6 F (37.6 C)  TempSrc:  Oral Oral Oral  SpO2:  98% 94% 95%  Weight:  100.1 kg (220 lb 10.9 oz) 101.3 kg (223 lb 5.2 oz)   Height:        Intake/Output Summary (Last 24 hours) at 11/18/2017 1459 Last data filed at 11/18/2017 1400 Gross per 24 hour  Intake 50 ml  Output 2000 ml  Net -1950 ml   Filed Weights   11/17/17 2335 11/18/17 0244 11/18/17 0437  Weight: 102.1 kg (225 lb 1.4 oz) 100.1 kg (220 lb 10.9 oz) 101.3 kg (223 lb 5.2  oz)    Examination:  General exam: Appears calm and comfortable on 5 lito f [place] oxygen.  Respiratory system: air entry fair, no wheezing or rhonchi.  Cardiovascular system: S1 & S2 heard, RRR. No JVD, murmurs. 2+ pedal edema. Gastrointestinal system: Abdomen is soft NT ND bs good.  Central nervous system: Alert and oriented. Non focal. Extremities: 2 + pedal edema, no cyanosis.  Skin: No rashes, lesions or ulcers Psychiatry: .anxious.     Data Reviewed: I have personally reviewed following labs and imaging  studies  CBC: Recent Labs  Lab 11/15/17 2040 11/16/17 0551 11/17/17 0149  WBC 6.6 6.3 7.8  HGB 12.6 12.1 11.7*  HCT 40.9 39.8 39.1  MCV 100.7* 99.7 101.8*  PLT 115* 133* 073*   Basic Metabolic Panel: Recent Labs  Lab 11/15/17 2040 11/16/17 0551 11/17/17 0148  NA 133* 138 135  K 4.7 4.3 4.6  CL 92* 96* 92*  CO2 29 32 29  GLUCOSE 446* 246* 294*  BUN 23* 24* 36*  CREATININE 3.77* 3.98* 5.16*  CALCIUM 8.7* 8.8* 8.6*  PHOS  --   --  4.7*   GFR: Estimated Creatinine Clearance: 11.4 mL/min (A) (by C-G formula based on SCr of 5.16 mg/dL (H)). Liver Function Tests: Recent Labs  Lab 11/15/17 2040 11/16/17 0551 11/17/17 0148  AST 24 22  --   ALT 17 18  --   ALKPHOS 117 121  --   BILITOT 1.1 1.2  --   PROT 6.9 6.8  --   ALBUMIN 3.6 3.4* 3.2*   No results for input(s): LIPASE, AMYLASE in the last 168 hours. No results for input(s): AMMONIA in the last 168 hours. Coagulation Profile: Recent Labs  Lab 11/16/17 0551 11/17/17 1048 11/18/17 0420  INR 1.92 1.79 1.55   Cardiac Enzymes: No results for input(s): CKTOTAL, CKMB, CKMBINDEX, TROPONINI in the last 168 hours. BNP (last 3 results) No results for input(s): PROBNP in the last 8760 hours. HbA1C: Recent Labs    11/16/17 0551  HGBA1C 9.6*   CBG: Recent Labs  Lab 11/17/17 1719 11/17/17 2044 11/18/17 0731 11/18/17 1137 11/18/17 1310  GLUCAP 158* 166* 186* 248* 262*   Lipid Profile: No results for input(s): CHOL, HDL, LDLCALC, TRIG, CHOLHDL, LDLDIRECT in the last 72 hours. Thyroid Function Tests: No results for input(s): TSH, T4TOTAL, FREET4, T3FREE, THYROIDAB in the last 72 hours. Anemia Panel: No results for input(s): VITAMINB12, FOLATE, FERRITIN, TIBC, IRON, RETICCTPCT in the last 72 hours. Sepsis Labs: Recent Labs  Lab 11/15/17 2112 11/15/17 2322 11/16/17 0045 11/16/17 0551  PROCALCITON  --   --  0.20 0.18  LATICACIDVEN 2.18* 1.61  --   --     Recent Results (from the past 240 hour(s))   MRSA PCR Screening     Status: None   Collection Time: 11/16/17  2:29 AM  Result Value Ref Range Status   MRSA by PCR NEGATIVE NEGATIVE Final    Comment:        The GeneXpert MRSA Assay (FDA approved for NASAL specimens only), is one component of a comprehensive MRSA colonization surveillance program. It is not intended to diagnose MRSA infection nor to guide or monitor treatment for MRSA infections. Performed at [facility], [place] [address] [place], [place] [address]          Radiology Studies: Dg Chest Port 1 View  Result Date: 11/17/2017 CLINICAL DATA:  Hypoxia. EXAM: PORTABLE CHEST 1 VIEW COMPARISON:  November 15, 2016 FINDINGS: Stable cardiomegaly. The  hila and mediastinum are unremarkable. Effusion and opacity in left base is stable. Mild pulmonary venous congestion. Mild fluid in the right-sided fissure. No other interval changes. IMPRESSION: 1. The small left effusion and underlying opacity is stable. 2. Mild pulmonary venous congestion. Mild fluid in the right minor fissure. These findings are stable as well. Electronically Signed   By: Dorise Bullion III M.D   On: 11/17/2017 12:43        Scheduled Meds: . aspirin EC  81 mg Oral Daily  . atorvastatin  80 mg Oral Daily  . Chlorhexidine Gluconate Cloth  6 each Topical Q0600  . ferric citrate  210 mg Oral TID WC  . insulin aspart  0-9 Units Subcutaneous TID WC  . insulin detemir  10 Units Subcutaneous Daily  . metoprolol tartrate  50 mg Oral BID  . risperiDONE  0.5 mg Oral QHS  . rOPINIRole  0.25 mg Oral QHS  . sertraline  200 mg Oral Daily  . warfarin  7.5 mg Oral ONCE-1800  . Warfarin - Pharmacist Dosing Inpatient   Does not apply q1800   Continuous Infusions: . sodium chloride 10 mL/hr at 11/16/17 0919  . piperacillin-tazobactam (ZOSYN)  IV 3.375 g (11/18/17 0916)     LOS: 3 days    Time spent: 25 minutes.     Hosie Poisson, MD [facility] Pager [phone] If 7PM-7AM, please contact  night-coverage [email or web address] Password [facility] 11/18/2017, 2:59 PM

## 2017-11-18 NOTE — NC FL2 (Addendum)
[place] LEVEL OF CARE SCREENING TOOL     IDENTIFICATION  Patient Name: Briana [name] Birthdate: 04/21/1946 Sex: female Admission Date (Current Location): 11/15/2017  [facility] [place] Number:  Manufacturing engineer and Address:  [facility], [place] [address] [place], [place] [address]      Provider Number: [id number]  Attending Physician Name and Address:  Hosie Poisson, MD  Relative Name and Phone Number:       Current Level of Care: Hospital Recommended Level of Care: [place] Prior Approval Number:    Date Approved/Denied:   PASRR Number: [id number]  Discharge Plan: SNF    Current Diagnoses: Patient Active Problem List   Diagnosis Date Noted  . HCAP (healthcare-associated pneumonia) 11/15/2017  . Encounter for therapeutic drug monitoring 02/05/2017  . Asymptomatic stenosis of left carotid artery 12/18/2016  . Symptomatic bradycardia 11/05/2016  . 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 Paroxysmal atrial flutter ([place]) 10/15/2016  . Bradycardia 10/09/2016  . Personal history of noncompliance with medical treatment, presenting hazards to health 09/17/2016  . Essential hypertension, benign   . Anemia of chronic kidney failure   . Chronic diastolic heart failure ([place]) 11/10/2015  . Depression   . Hypoalbuminemia due to protein-calorie malnutrition ([place])   . Debility 10/18/2015  . Abnormality of gait   . Long term current use of anticoagulant therapy   . Chronic pain syndrome   . Hx of CABG 2005   . Hx of non-ST elevation myocardial infarction (NSTEMI)   . Paroxysmal atrial fibrillation (HCC)   . Diabetes mellitus with end-stage renal disease ([place])   . ESRD (end stage renal disease) on dialysis ([place])   . AKI (acute kidney injury) ([place])   . Non-ST elevation myocardial infarction (NSTEMI) ([place]) 10/14/2015  . Pulmonary edema   . Abnormal nuclear stress test   . Acute renal failure with  tubular necrosis ([place])   . Hypertensive emergency   . CAP (community acquired pneumonia)   . CAD in native artery   . Pulmonary hypertension ([place])   . Atrial fibrillation with RVR ([place])   . Acute on chronic renal failure ([place])   . Physical deconditioning   . Acute respiratory failure with hypoxia ([place])   . Acute pulmonary edema (HCC)   . Acute blood loss anemia   . Acute respiratory failure ([place]) 10/01/2015  . Hypoxia 10/01/2015  . Elevated troponin 10/01/2015  . Chest pain   . DVT (deep venous thrombosis) ([place])   . Hypoxemia   . Mixed hyperlipidemia 03/28/2015  . Vitamin D deficiency 03/28/2015  . Acute renal failure ([place]) 03/04/2014  . UTI (lower urinary tract infection) 03/02/2014  . Acute encephalopathy 03/02/2014  . Dehydration 03/02/2014  . Generalized weakness 03/02/2014  . Obesity 03/02/2014  . Depressed mood 03/02/2014  . Hx of adenomatous colonic polyps 01/28/2012  . Occlusion and stenosis of carotid artery without mention of cerebral infarction 12/03/2011    Orientation RESPIRATION BLADDER Height & Weight     Self, Situation, Time, Place  O2 Continent Weight: 223 lb 5.2 oz (101.3 kg) Height:  5\' 4"  (162.6 cm)  BEHAVIORAL SYMPTOMS/MOOD NEUROLOGICAL BOWEL NUTRITION STATUS      Continent Diet(fluid restriction; renal)  AMBULATORY STATUS COMMUNICATION OF NEEDS Skin   Limited Assist Verbally Normal                       Personal Care Assistance Level of Assistance  Bathing, Dressing  Bathing Assistance: Limited assistance   Dressing Assistance: Limited assistance     Functional Limitations [place]  PT (By licensed PT), OT (By licensed OT)     PT Frequency: 5/wk OT Frequency: 5/wk            Contractures      Additional Factors Info  Code Status, Allergies, Insulin Sliding Scale, Psychotropic Code Status Info: FULL Allergies Info: Tape, Gemfibrozil, Oxycodone, Carbamazepine, Clonazepam, Macrobid  Nitrofurantoin Psychotropic Info: zoloft Insulin Sliding Scale Info: 4/day       Current Medications (11/18/2017):  This is the current hospital active medication list Current Facility-Administered Medications  Medication Dose Route Frequency Provider Last Rate Last Dose  . 0.9 %  sodium chloride infusion   Intravenous PRN Hosie Poisson, MD 10 mL/hr at 11/16/17 0919    . aspirin EC tablet 81 mg  81 mg Oral Daily Hosie Poisson, MD   81 mg at 11/18/17 0909  . atorvastatin (LIPITOR) tablet 80 mg  80 mg Oral Daily Hosie Poisson, MD   80 mg at 11/18/17 0908  . Chlorhexidine Gluconate Cloth 2 % PADS 6 each  6 each Topical Q0600 Loren Racer, PA-C   6 each at 11/18/17 0442  . diazepam (VALIUM) tablet 5 mg  5 mg Oral TID PRN Hosie Poisson, MD   5 mg at 11/16/17 0852  . ferric citrate (AURYXIA) tablet 210 mg  210 mg Oral TID WC Hosie Poisson, MD   210 mg at 11/18/17 0908  . HYDROcodone-acetaminophen (NORCO/VICODIN) 5-325 MG per tablet 1 tablet  1 tablet Oral Q6H PRN Hosie Poisson, MD   1 tablet at 11/16/17 0907  . insulin aspart (novoLOG) injection 0-9 Units  0-9 Units Subcutaneous TID WC Hosie Poisson, MD   2 Units at 11/18/17 0908  . insulin detemir (LEVEMIR) injection 10 Units  10 Units Subcutaneous Daily Hosie Poisson, MD      . ipratropium-albuterol (DUONEB) 0.5-2.5 (3) MG/3ML nebulizer solution 3 mL  3 mL Nebulization Q2H PRN Hosie Poisson, MD      . metoprolol tartrate (LOPRESSOR) tablet 50 mg  50 mg Oral BID Hosie Poisson, MD   50 mg at 11/18/17 0908  . piperacillin-tazobactam (ZOSYN) IVPB 3.375 g  3.375 g Intravenous Q12H Hosie Poisson, MD 12.5 mL/hr at 11/18/17 0916 3.375 g at 11/18/17 0916  . rOPINIRole (REQUIP) tablet 0.25 mg  0.25 mg Oral QHS Hosie Poisson, MD   0.25 mg at 11/16/17 2225  . sertraline (ZOLOFT) tablet 200 mg  200 mg Oral Daily Hosie Poisson, MD   200 mg at 11/18/17 0908  . Warfarin - Pharmacist Dosing Inpatient   Does not apply [id number] Hosie Poisson, MD          Discharge Medications: Please see discharge summary for a list of discharge medications.  Relevant Imaging Results:  Relevant Lab Results:   Additional Information SS#: [id number]; [facility] TTS at [facility] in [place], [place], [place]

## 2017-11-18 NOTE — Progress Notes (Signed)
OT Note - Addendum    11/18/17 1715  OT Visit Information  Last OT Received On 11/18/17  OT Time Calculation  OT Start Time (ACUTE ONLY) 1535  OT Stop Time (ACUTE ONLY) 1559  OT Time Calculation (min) 24 min  OT General Charges  $OT Visit 1 Visit  OT Evaluation  $OT Eval Moderate Complexity 1 Mod  OT Treatments  $Self Care/Home Management  8-22 mins  Maurie Boettcher, OT/L  OT Clinical Specialist [phone]

## 2017-11-19 DIAGNOSIS — D62 Acute posthemorrhagic anemia: Secondary | ICD-10-CM

## 2017-11-19 DIAGNOSIS — Z951 Presence of aortocoronary bypass graft: Secondary | ICD-10-CM

## 2017-11-19 DIAGNOSIS — J189 Pneumonia, unspecified organism: Secondary | ICD-10-CM

## 2017-11-19 DIAGNOSIS — N186 End stage renal disease: Secondary | ICD-10-CM

## 2017-11-19 DIAGNOSIS — Z9981 Dependence on supplemental oxygen: Secondary | ICD-10-CM

## 2017-11-19 DIAGNOSIS — Z992 Dependence on renal dialysis: Secondary | ICD-10-CM

## 2017-11-19 DIAGNOSIS — I480 Paroxysmal atrial fibrillation: Secondary | ICD-10-CM

## 2017-11-19 DIAGNOSIS — E1122 Type 2 diabetes mellitus with diabetic chronic kidney disease: Secondary | ICD-10-CM

## 2017-11-19 DIAGNOSIS — R410 Disorientation, unspecified: Secondary | ICD-10-CM

## 2017-11-19 DIAGNOSIS — E1169 Type 2 diabetes mellitus with other specified complication: Secondary | ICD-10-CM

## 2017-11-19 DIAGNOSIS — F05 Delirium due to known physiological condition: Secondary | ICD-10-CM

## 2017-11-19 DIAGNOSIS — E669 Obesity, unspecified: Secondary | ICD-10-CM

## 2017-11-19 DIAGNOSIS — D696 Thrombocytopenia, unspecified: Secondary | ICD-10-CM

## 2017-11-19 DIAGNOSIS — R5381 Other malaise: Secondary | ICD-10-CM

## 2017-11-19 DIAGNOSIS — I10 Essential (primary) hypertension: Secondary | ICD-10-CM

## 2017-11-19 DIAGNOSIS — D638 Anemia in other chronic diseases classified elsewhere: Secondary | ICD-10-CM

## 2017-11-19 DIAGNOSIS — Z8673 Personal history of transient ischemic attack (TIA), and cerebral infarction without residual deficits: Secondary | ICD-10-CM

## 2017-11-19 DIAGNOSIS — I5032 Chronic diastolic (congestive) heart failure: Secondary | ICD-10-CM

## 2017-11-19 LAB — CBC
HCT: 37.1 % (ref 36.0–46.0)
HEMOGLOBIN: 11.3 g/dL — AB (ref 12.0–15.0)
MCH: 30.3 pg (ref 26.0–34.0)
MCHC: 30.5 g/dL (ref 30.0–36.0)
MCV: 99.5 fL (ref 78.0–100.0)
Platelets: 132 10*3/uL — ABNORMAL LOW (ref 150–400)
RBC: 3.73 MIL/uL — AB (ref 3.87–5.11)
RDW: 15.7 % — ABNORMAL HIGH (ref 11.5–15.5)
WBC: 6.1 10*3/uL (ref 4.0–10.5)

## 2017-11-19 LAB — PROTIME-INR
INR: 1.7
Prothrombin Time: 19.8 seconds — ABNORMAL HIGH (ref 11.4–15.2)

## 2017-11-19 LAB — RENAL FUNCTION PANEL
ANION GAP: 15 (ref 5–15)
Albumin: 3 g/dL — ABNORMAL LOW (ref 3.5–5.0)
BUN: 31 mg/dL — ABNORMAL HIGH (ref 8–23)
CALCIUM: 8.6 mg/dL — AB (ref 8.9–10.3)
CHLORIDE: 94 mmol/L — AB (ref 98–111)
CO2: 25 mmol/L (ref 22–32)
Creatinine, Ser: 4.98 mg/dL — ABNORMAL HIGH (ref 0.44–1.00)
GFR calc non Af Amer: 8 mL/min — ABNORMAL LOW (ref >60)
GFR, EST AFRICAN AMERICAN: 9 mL/min — AB (ref >60)
GLUCOSE: 185 mg/dL — AB (ref 70–99)
PHOSPHORUS: 4.4 mg/dL (ref 2.5–4.6)
Potassium: 3.6 mmol/L (ref 3.5–5.1)
Sodium: 134 mmol/L — ABNORMAL LOW (ref 135–145)

## 2017-11-19 LAB — GLUCOSE, CAPILLARY
GLUCOSE-CAPILLARY: 120 mg/dL — AB (ref 70–99)
GLUCOSE-CAPILLARY: 130 mg/dL — AB (ref 70–99)
Glucose-Capillary: 99 mg/dL (ref 70–99)

## 2017-11-19 MED ORDER — WARFARIN SODIUM 7.5 MG PO TABS
7.5000 mg | ORAL_TABLET | Freq: Once | ORAL | Status: AC
  Administered 2017-11-19: 7.5 mg via ORAL
  Filled 2017-11-19: qty 1

## 2017-11-19 MED ORDER — METOPROLOL TARTRATE 25 MG PO TABS
25.0000 mg | ORAL_TABLET | Freq: Two times a day (BID) | ORAL | Status: DC
  Administered 2017-11-20 – 2017-11-21 (×4): 25 mg via ORAL
  Filled 2017-11-19 (×5): qty 1

## 2017-11-19 NOTE — Progress Notes (Signed)
ANTICOAGULATION CONSULT NOTE - Follow-Up Consult  Pharmacy Consult for Coumadin Indication: atrial fibrillation  Patient Measurements: Height: 5\' 4"  (162.6 cm) Weight: 222 lb 10.6 oz (101 kg) IBW/kg (Calculated) : 54.7  Vital Signs: Temp: 98.4 F (36.9 C) (06/26 0845) Temp Source: Oral (06/26 0845) BP: 116/48 (06/26 0845) Pulse Rate: 65 (06/26 0845)  Labs: Recent Labs    11/17/17 0148 11/17/17 0149 11/17/17 1048 11/18/17 0420 11/19/17 0346  HGB  --  11.7*  --   --   --   HCT  --  39.1  --   --   --   PLT  --  146*  --   --   --   LABPROT  --   --  20.6* 18.4* 19.8*  INR  --   --  1.79 1.55 1.70  CREATININE 5.16*  --   --   --   --     Estimated Creatinine Clearance: 11.4 mL/min (A) (by C-G formula based on SCr of 5.16 mg/dL (H)).  Assessment: 72 y.o. female admitted with PNA, h/o Afib, to continue warfarin. Admit INR 1.92 on PTA dose - last dose noted to be 6/21. Pharmacy consulted to dose this admission.    PTA dose of warfarin noted to be 5mg  all days except 2.5mg  on Sundays.   INR today remains low at 1.7  Goal of Therapy:  INR 2-3 Monitor platelets by anticoagulation protocol: Yes   Plan:  Warfarin 7.5 mg x 1  Daily INR/ CBC   Levester Fresh, PharmD, BCPS, BCCCP Clinical Pharmacist Clinical phone for 11/19/2017 from 7a-3:30p: [phone] If after 3:30p, please call main pharmacy at: [phone] 11/19/2017 9:44 AM

## 2017-11-19 NOTE — Consult Note (Signed)
Physical Medicine and Rehabilitation Consult Reason for Consult: Decreased functional mobility Referring Physician: [facility]   HPI: Briana [name] is a 72 y.o. right-handed female with history of CAD with CABG 2005, right posterior occipital infarction, nocturnal hypoxia with 2 L oxygen at night, hypertension with LVH, end-stage renal disease with hemodialysis, chronic diastolic congestive heart failure/PAF with chronic Coumadin status post AICD placement.  History taken from chart review. Patient received inpatient rehab services 10/18/2015 to 10/25/2015 for debilitation related to multi-medical. Presented 11/16/2017 visual hallucinations.  Per chart review patient lives with husband who has dementia.  Reported to be independent prior to admission with ADLs and driving.  Cranial CT scan reviewed, unremarkable for acute intracranial process. Chest x-ray interval left basilar atelectasis and possible pneumonia.  Lactic acid within normal limits 1.61.  Recent echocardiogram with diastolic dysfunction with hypokinesis of the inferior myocardium.  Placed on Zosyn for suspected HCAP.  Hemodialysis ongoing as per renal services.  Chronic Coumadin for atrial fibrillation.  Psychiatry services consulted for visual hallucinations with hospital-acquired delirium.  Patient remains on Valium as prior to admission as well as the addition of Risperdal at night.  Physical and occupational therapy evaluations completed and ongoing question CIR versus SNF.  Review of Systems  Unable to perform ROS: Other  Lethargy  Past Medical History:  Diagnosis Date  . Adenomatous colon polyp 09/2007  . Arthritis   . Atrial fibrillation with rapid ventricular response ([place]) 10/05/2015  . Bradycardia   . CAD (coronary artery disease)    a. s/p CABG 2005.  . Carotid artery disease ([place])    a. s/p R CEA 2011. Followed by vascular -  duplex 04/16/16: <42% RICA, 70-62% LICA.  Marland Kitchen Chronic anemia   . Chronic diastolic CHF  (congestive heart failure) ([place])   . Depression with anxiety   . Diabetes mellitus   . ESRD (end stage renal disease) ([place])    T Th Sat  . Heart murmur   . Hyperlipidemia   . Hypertension   . Neuropathy   . PAD (peripheral artery disease) ([place])   . Paroxysmal atrial flutter ([place])   . Peripheral vascular disease ([place])   . Pulmonary hypertension ([place])   . Rheumatic fever   . Sleep apnea    Past Surgical History:  Procedure Laterality Date  . BASCILIC VEIN TRANSPOSITION Left 12/04/2015   Procedure: BASILIC VEIN TRANSPOSITION;  Surgeon: Rosetta Posner, MD;  Location: [place];  Service: Vascular;  Laterality: Left;  . CAROTID ENDARTERECTOMY  10/04/2009   Right CEA  . CORONARY ARTERY BYPASS GRAFT  2005  . ENDARTERECTOMY Left 12/18/2016   Procedure: ENDARTERECTOMY CAROTID;  Surgeon: Rosetta Posner, MD;  Location: [place];  Service: Vascular;  Laterality: Left;  . HEMATOMA EVACUATION Left 12/18/2016   Procedure: EVACUATION of left neck HEMATOMA;  Surgeon: Elam Dutch, MD;  Location: [place];  Service: Vascular;  Laterality: Left;  . PACEMAKER IMPLANT N/A 11/06/2016   Procedure: Pacemaker Implant;  Surgeon: Evans Lance, MD;  Location: [place] [facility];  Service: Cardiovascular;  Laterality: N/A;  . PATCH ANGIOPLASTY Left 12/18/2016   Procedure: PATCH ANGIOPLASTY WITH HEMASHIELD PLATIUM FINESSE PATCH;  Surgeon: Rosetta Posner, MD;  Location: [place];  Service: Vascular;  Laterality: Left;  . PERIPHERAL VASCULAR CATHETERIZATION N/A 06/14/2016   Procedure: A/V Fistulagram - Left Upper;  Surgeon: Elam Dutch, MD;  Location: [place] [facility];  Service: Cardiovascular;  Laterality: N/A;  . POLYPECTOMY  02/25/2012   Procedure: POLYPECTOMY;  Surgeon: Danie Binder, MD;  Location: [facility];  Service: Endoscopy;  Laterality: N/A;  . TUBAL LIGATION     Family History  Problem Relation Age of Onset  . Breast cancer Mother   . Heart disease Father   . Diabetes Father   . Heart disease Brother     Social History:  reports that she has never smoked. She has never used smokeless tobacco. She reports that she does not drink alcohol or use drugs. Allergies:  Allergies  Allergen Reactions  . Tape Other (See Comments)    TAPE WILL CAUSE BLISTERS/SORES; PLEASE USE COBAN WRAP!!  . Gemfibrozil Other (See Comments)    Patient is not aware of this allergy but states that she had side effects to a cholesterol medication in the past  . Oxycodone Other (See Comments)    Hallucinations   . Carbamazepine Other (See Comments)    Reaction to tegretol - "loopy"  . Clonazepam Other (See Comments)    Sleepy   . Macrobid [Nitrofurantoin] Nausea And Vomiting   Medications Prior to Admission  Medication Sig Dispense Refill  . aspirin EC 81 MG EC tablet Take 1 tablet (81 mg total) by mouth daily. 30 tablet 1  . atorvastatin (LIPITOR) 80 MG tablet Take 1 tablet (80 mg total) by mouth daily. 30 tablet 3  . blood glucose meter kit and supplies KIT Dispense based on patient and insurance preference. Use up to four times daily as directed. (FOR ICD-10 E11.65) 1 each 0  . calcium acetate (PHOSLO) 667 MG capsule Take 2 capsules (1,334 mg total) by mouth 3 (three) times daily with meals. 180 capsule 1  . cetirizine (ZYRTEC) 10 MG tablet Take 10 mg by mouth daily.     . diazepam (VALIUM) 5 MG tablet Take 5 mg by mouth 3 (three) times daily as needed for anxiety.     . ferric citrate (AURYXIA) 1 GM 210 MG(Fe) tablet Take 210 mg by mouth 3 (three) times daily with meals.     Marland Kitchen HYDROcodone-acetaminophen (NORCO/VICODIN) 5-325 MG tablet Take 1 tablet by mouth every 6 (six) hours as needed for moderate pain (Must last 30 days). 112 tablet 0  . Insulin Lispro Prot & Lispro (HUMALOG MIX 75/25 KWIKPEN) (75-25) 100 UNIT/ML Kwikpen INJECT 35 UNITS S.Q. WITH BREAKFAST; 30 UNITS S.Q. WITH SUPPER WHEN PRE-MEAL GLUCOSE IS ABOVE 90MG/DL. (Patient taking differently: Inject 30-35 Units into the skin 2 (two) times daily. INJECT  30 UNITS S.Q. WITH BREAKFAST; 30-35 UNITS S.Q. WITH SUPPER WHEN PRE-MEAL GLUCOSE IS ABOVE 90MG/DL.) 15 mL 2  . metoprolol tartrate (LOPRESSOR) 50 MG tablet Take 1 tablet (50 mg total) by mouth 2 (two) times daily. 180 tablet 3  . nitroGLYCERIN (NITROSTAT) 0.4 MG SL tablet Place 1 tablet (0.4 mg total) under the tongue every 5 (five) minutes as needed for chest pain. Max three pills 45 tablet 0  . OXYGEN Inhale 2 L into the lungs continuous.     . polyethylene glycol (MIRALAX / GLYCOLAX) packet Take 17 g by mouth daily as needed for mild constipation.    . protein supplement (RESOURCE BENEPROTEIN) POWD Take 6 g by mouth 3 (three) times daily with meals.  0  . rOPINIRole (REQUIP) 0.25 MG tablet Take 1 tablet (0.25 mg total) by mouth at bedtime. 30 tablet 3  . sertraline (ZOLOFT) 100 MG tablet Take 200 mg by mouth daily.    . Vitamin D, Ergocalciferol, (DRISDOL) 50000 units CAPS  capsule Take 50,000 Units by mouth every Thursday.     . warfarin (COUMADIN) 5 MG tablet Take 7.5 mg by mouth daily on Monday and Thursday. Take 5 mg by mouth daily on all other days (Patient taking differently: Take 2.5-5 mg by mouth daily. Take 2.5 mg on Sundays. All other days take 5 mg) 90 tablet 3  . Insulin Lispro Prot & Lispro (HUMALOG MIX 75/25 KWIKPEN) (75-25) 100 UNIT/ML Kwikpen INJECT 35 UNITS S.Q. WITH BREAKFAST; 30 UNITS S.Q. WITH SUPPER WHEN PRE-MEAL GLUCOSE IS ABOVE 90ML/DL. (Patient not taking: Reported on 11/15/2017) 9 mL 0    Home: Home Living Family/patient expects to be discharged to:: Private residence Living Arrangements: Spouse/significant other Available Help at Discharge: Family, Available 24 hours/day Type of Home: [place]: One level Bathroom Toilet: Standard Home Equipment: Environmental consultant - 2 wheels Additional Comments: Pt reports her husband has dementia  Functional History: Prior Function Level of Independence: Needs assistance Gait / Transfers Assistance Needed: was on RW but pt cannot  give details ADL's / Homemaking Assistance Needed: reports independence with ADLs;sister-in law comes to help if needed Comments: pt reports she was driving; Functional Status:  Mobility: Bed Mobility General bed mobility comments: sitting EOB upon arrival Transfers Overall transfer level: Needs assistance Equipment used: Rolling walker (2 wheeled) Transfers: Sit to/from Stand, [employer] Transfers Sit to Stand: Mod assist Stand pivot transfers: Mod assist(VC for sequence/hand place physical assist for stability) General transfer comment: modA for power up and stabilize in standing Ambulation/Gait Ambulation/Gait assistance: Min guard Gait Distance (Feet): 100 Feet Assistive device: Rolling walker (2 wheeled), 1 person hand held assist Gait Pattern/deviations: Step-through pattern, Shuffle, Decreased stride length, Wide base of support General Gait Details: slow pivot turn then sat her down to ck O2 sats Gait velocity: reduced Gait velocity interpretation: <1.8 ft/sec, indicate of risk for recurrent falls    ADL: ADL Overall ADL's : Needs assistance/impaired Eating/Feeding: Set up, Sitting Grooming: Min guard, Sitting Upper Body Bathing: Min guard, Sitting Lower Body Bathing: Moderate assistance, Sit to/from stand Upper Body Dressing : Min guard, Sitting Lower Body Dressing: Moderate assistance, Sit to/from stand Lower Body Dressing Details (indicate cue type and reason): pt was able to figure-4 LLE to adjust sock, unable to figure-4 RLE Toilet Transfer: Moderate assistance, Stand-pivot, RW, Cueing for sequencing Toilet Transfer Details (indicate cue type and reason): simulated with stand-pivot from EOB to recliner;pt required VC for sequencing of pivot and safe hand placement Toileting- Clothing Manipulation and Hygiene: Moderate assistance, Sit to/from stand Functional mobility during ADLs: Moderate assistance, Rolling walker, Cueing for safety, Cueing for sequencing General  ADL Comments: VC for redirection  Cognition: Cognition Overall Cognitive Status: No family/caregiver present to determine baseline cognitive functioning Orientation Level: Oriented X4 Cognition Arousal/Alertness: Awake/alert Behavior During Therapy: Impulsive Overall Cognitive Status: No family/caregiver present to determine baseline cognitive functioning Area of Impairment: Attention, Following commands, Safety/judgement, Awareness, Problem solving Current Attention Level: Focused Following Commands: Follows one step commands with increased time Safety/Judgement: Decreased awareness of deficits, Decreased awareness of safety Awareness: Intellectual Problem Solving: Requires verbal cues, Decreased initiation, Slow processing General Comments: pt is not clear on all history details;pt reports seeing mice in room;reports hallucinations have only been occurring for last 3 days, unable to identify that rats are not typically in hospital rooms;thoughts were tangential  Blood pressure (!) 120/59, pulse 60, temperature 98.6 F (37 C), temperature source Oral, resp. rate 19, height '5\' 4"'  (1.626 m), weight 101.3 kg (223 lb 5.2 oz),  SpO2 96 %. Physical Exam  Vitals reviewed. Constitutional: She appears well-developed.  Obese  HENT:  Head: Normocephalic and atraumatic.  Eyes: EOM are normal. Right eye exhibits no discharge. Left eye exhibits no discharge.  Neck: Normal range of motion. Neck supple. No thyromegaly present.  Cardiovascular:  Cardiac rate controlled  Respiratory: Effort normal and breath sounds normal.  +[place]  GI: Soft. Bowel sounds are normal. She exhibits no distension.  Musculoskeletal:  LE edema  Neurological: She is alert.  Extremely lethargic, only briefly opening eyes. Motor: Limited by participation, however, resisting with LUE/LLE.  No resistance noted with RLE>RUE  Skin: Skin is warm and dry.  Psychiatric:  Unable to assess due to extremely lethargy    Results  for orders placed or performed during the hospital encounter of 11/15/17 (from the past 24 hour(s))  Glucose, capillary     Status: Abnormal   Collection Time: 11/18/17  7:31 AM  Result Value Ref Range   Glucose-Capillary 186 (H) 70 - 99 mg/dL  Glucose, capillary     Status: Abnormal   Collection Time: 11/18/17 11:37 AM  Result Value Ref Range   Glucose-Capillary 248 (H) 70 - 99 mg/dL  Glucose, capillary     Status: Abnormal   Collection Time: 11/18/17  1:10 PM  Result Value Ref Range   Glucose-Capillary 262 (H) 70 - 99 mg/dL  Glucose, capillary     Status: Abnormal   Collection Time: 11/18/17  5:12 PM  Result Value Ref Range   Glucose-Capillary 113 (H) 70 - 99 mg/dL  Glucose, capillary     Status: None   Collection Time: 11/18/17  9:25 PM  Result Value Ref Range   Glucose-Capillary 88 70 - 99 mg/dL  Protime-INR     Status: Abnormal   Collection Time: 11/19/17  3:46 AM  Result Value Ref Range   Prothrombin Time 19.8 (H) 11.4 - 15.2 seconds   INR 1.70    Dg Chest Port 1 View  Result Date: 11/17/2017 CLINICAL DATA:  Hypoxia. EXAM: PORTABLE CHEST 1 VIEW COMPARISON:  November 15, 2016 FINDINGS: Stable cardiomegaly. The hila and mediastinum are unremarkable. Effusion and opacity in left base is stable. Mild pulmonary venous congestion. Mild fluid in the right-sided fissure. No other interval changes. IMPRESSION: 1. The small left effusion and underlying opacity is stable. 2. Mild pulmonary venous congestion. Mild fluid in the right minor fissure. These findings are stable as well. Electronically Signed   By: Dorise Bullion III M.D   On: 11/17/2017 12:43    Assessment/Plan: Diagnosis: Debility Labs and images independently reviewed.  Records reviewed and summated above.  1. Does the need for close, 24 hr/day medical supervision in concert with the patient's rehab needs make it unreasonable for this patient to be served in a less intensive setting? Potentially  2. Co-Morbidities requiring  supervision/potential complications: hospital-acquired delirium (avoid cognitively altering meds), HCAP (cont IV abx), CAD with CABG 2005 (cont meds), history of right posterior occipital infarction, nocturnal hypoxia (cont supplemental 2L oxygen at night), HTN (monitor and provide prns in accordance with increased physical exertion and pain), end-stage renal disease with hemodialysis (recs per Nephro), chronic diastolic congestive (monitor for signs/symptoms of fluid overload), heart failure/PAF (cont meds), DM (Monitor in accordance with exercise and adjust meds as necessary), ABLA on anemia of chronic disease (transfuse if necessary to ensure appropriate perfusion for increased activity tolerance), thrombocytopenia (< 60,000/mm3 no resistive exercise) 3. Due to safety, disease management and patient education, does the patient require  24 hr/day rehab nursing? Yes 4. Does the patient require coordinated care of a physician, rehab nurse, PT (1-2 hrs/day, 5 days/week) and OT (1-2 hrs/day, 5 days/week) to address physical and functional deficits in the context of the above medical diagnosis(es)? Potentially Addressing deficits in the following areas: balance, endurance, locomotion, strength, transferring, bathing, dressing, toileting and psychosocial support 5. Can the patient actively participate in an intensive therapy program of at least 3 hrs of therapy per day at least 5 days per week? Potentially 6. The potential for patient to make measurable gains while on inpatient rehab is fair 7. Anticipated functional outcomes upon discharge from inpatient rehab are supervision  with PT, supervision with OT, n/a with SLP. 8. Estimated rehab length of stay to reach the above functional goals is: 3-6 days. 9. Anticipated D/C setting: Other 10. Anticipated post D/C treatments: SNF 11. Overall Rehab/Functional Prognosis: good  RECOMMENDATIONS: This patient's condition is appropriate for continued rehabilitative  care in the following setting: Pt doing well on day of eval, currently limited by lethargy.  Patient with recent rehab stay as well.  Do no anticipate she will require CIR.  If no family support available, recommend SNF. Patient has agreed to participate in recommended program. Potentially Note that insurance prior authorization may be required for reimbursement for recommended care.  Comment: Rehab Admissions Coordinator to follow up.   I have personally performed a face to face diagnostic evaluation, including, but not limited to relevant history and physical exam findings, of this patient and developed relevant assessment and plan.  Additionally, I have reviewed and concur with the physician assistant's documentation above.   Delice Lesch, MD, ABPMR Lavon Paganini Angiulli, PA-C 11/19/2017

## 2017-11-19 NOTE — Progress Notes (Signed)
PROGRESS NOTE  Briana [name] CZY:[id number] DOB: Mar 27, 1946 DOA: 11/15/2017 PCP: Vidal Schwalbe, MD  HPI  Briana [name] is a 72 y.o. year old female with medical history significant for CAD with CABG 2005, right posterior occipital infarction, nocturnal hypoxia with 2 L oxygen at night, hypertension with LVH, end-stage renal disease with hemodialysis, chronic diastolic congestive heart failure/PAF with chronic Coumadin status post AICD placement  who presented on 11/15/2017 with daughter reporting visual hallucinations and was found to have significant fluid overload.  Interval History No acute events   ROS:    Subjective Says she is sleepy  Assessment/Plan: Principal Problem:   Delirium due to another medical condition Active Problems:   Mixed hyperlipidemia   Pulmonary hypertension (HCC)   Physical deconditioning   Debility   Hx of CABG 2005   Paroxysmal atrial fibrillation (HCC)   Diabetes mellitus with end-stage renal disease ([place])   ESRD (end stage renal disease) on dialysis ([place])   Depression   Essential hypertension, benign   HCAP (healthcare-associated pneumonia)   Delirium   History of CVA (cerebrovascular accident)   Supplemental oxygen dependent   Chronic diastolic congestive heart failure (HCC)   ESRD on dialysis (HCC)   PAF (paroxysmal atrial fibrillation) (HCC)   Diabetes mellitus type 2 in obese (HCC)   Anemia of chronic disease   Thrombocytopenia (HCC)   Acute on chronic hypoxic respiratory failure, secondary to volume overload. CXR showed pulmonary congestionHas weaned o2 from 5 L now to 2 L and no respiratory distress. Doing well fluid removed with HD. CXR on admission with left sided opacity currently being treated with zosyn plan to deescalate to orals if blood cultures remain negative  ESRD on HD. Was 4-5 kg up in weight on admission now at dry weight per nephrology. Will continue max UF to lower dry weight as tolerated tomorrow as well.  Metoprolol lowered given lowering of dry weight. Nephrology managing volume status with HD  PAF, rate controlled. Continue coumadin and lopressor 25 mg BID  CAD s/p CABG. No chest pain. Continue home meds  Visual hallucinations. Endorses none on my exam. Somnolent but easily arousable. Oriented to self and time. Psychiatry consulted believed related to hospital acquired delirium. Recommended Risperdal 0.5 mg daily for psychosis( nightly if causes sedation) for short term use and to avoic EPS. Continue zofolof and valium for depression and anxiety  HLD, stable, home lipitor  T2DM, A1c 9.6 stable. Glucose 130-190 here.  Levemir 15 U qd, Novolog 2 u TID with meals Code Status: Full Code   Family Communication: will call daughter   Disposition Plan: stable volume status, mental status, likely SNF   Consultants:  Nephrology, Psychiatry  Procedures:  none  Antimicrobials: Anti-infectives (From admission, onward)   Start     Dose/Rate Route Frequency Ordered Stop   11/18/17 1200  vancomycin (VANCOCIN) IVPB 750 mg/150 ml premix  Status:  Discontinued     750 mg 150 mL/hr over 60 Minutes Intravenous Every T-Th-Sa (Hemodialysis) 11/16/17 0112 11/17/17 1808   11/17/17 0230  vancomycin (VANCOCIN) IVPB 750 mg/150 ml premix     750 mg 150 mL/hr over 60 Minutes Intravenous  Once 11/17/17 0226 11/17/17 0513   11/16/17 1600  vancomycin (VANCOCIN) IVPB 750 mg/150 ml premix     750 mg 150 mL/hr over 60 Minutes Intravenous Every Sun (Hemodialysis) 11/16/17 1234 11/17/17 0428   11/16/17 0130  vancomycin (VANCOCIN) 1,500 mg in sodium chloride 0.9 % 500 mL IVPB  1,500 mg 250 mL/hr over 120 Minutes Intravenous  Once 11/16/17 0112 11/16/17 0836   11/16/17 0130  piperacillin-tazobactam (ZOSYN) IVPB 3.375 g     3.375 g 12.5 mL/hr over 240 Minutes Intravenous Every 12 hours 11/16/17 0112     11/15/17 2245  cefTRIAXone (ROCEPHIN) 1 g in sodium chloride 0.9 % 100 mL IVPB     1 g 200 mL/hr over 30  Minutes Intravenous  Once 11/15/17 2242 11/15/17 2350   11/15/17 2245  azithromycin (ZITHROMAX) 500 mg in sodium chloride 0.9 % 250 mL IVPB  Status:  Discontinued     500 mg 250 mL/hr over 60 Minutes Intravenous  Once 11/15/17 2242 11/16/17 0104              DVT prophylaxis:  coumadin   Objective: Vitals:   11/19/17 1305 11/19/17 1617 11/19/17 2015 11/19/17 2218  BP: 126/66 (!) 123/44 124/60   Pulse: 60 (!) 59 65 (!) 59  Resp: (!) 21 (!) 21 (!) 21   Temp: 98.4 F (36.9 C) 97.8 F (36.6 C) 99.4 F (37.4 C)   TempSrc: Oral Axillary Oral   SpO2: 96% 96% 99%   Weight: 98 kg (216 lb 0.8 oz)     Height:        Intake/Output Summary (Last 24 hours) at 11/19/2017 2347 Last data filed at 11/19/2017 2100 Gross per 24 hour  Intake 290 ml  Output 3000 ml  Net -2710 ml   Filed Weights   11/18/17 0437 11/19/17 0845 11/19/17 1305  Weight: 101.3 kg (223 lb 5.2 oz) 101 kg (222 lb 10.6 oz) 98 kg (216 lb 0.8 oz)    Exam:  Constitutional:normal appearing female, no distress Eyes: EOMI, anicteric, normal conjunctivae ENMT: Oropharynx with moist mucous membranes, Cardiovascular: irregularly irregular, no MRGs, with no peripheral edema Respiratory: Normal respiratory effort on 4L, clear breath sounds  Abdomen: Soft,non-tender, Skin: No rash ulcers, or lesions. Without skin tenting  Neurologic: Grossly no focal neuro deficit. Psychiatric:Appropriate affect, and mood. Mental status somnolent but easily arousable, oriented to self and time  Data Reviewed: CBC: Recent Labs  Lab 11/15/17 2040 11/16/17 0551 11/17/17 0149 11/19/17 0913  WBC 6.6 6.3 7.8 6.1  HGB 12.6 12.1 11.7* 11.3*  HCT 40.9 39.8 39.1 37.1  MCV 100.7* 99.7 101.8* 99.5  PLT 115* 133* 146* 993*   Basic Metabolic Panel: Recent Labs  Lab 11/15/17 2040 11/16/17 0551 11/17/17 0148 11/19/17 0913  NA 133* 138 135 134*  K 4.7 4.3 4.6 3.6  CL 92* 96* 92* 94*  CO2 29 32 29 25  GLUCOSE 446* 246* 294* 185*    BUN 23* 24* 36* 31*  CREATININE 3.77* 3.98* 5.16* 4.98*  CALCIUM 8.7* 8.8* 8.6* 8.6*  PHOS  --   --  4.7* 4.4   GFR: Estimated Creatinine Clearance: 11.6 mL/min (A) (by C-G formula based on SCr of 4.98 mg/dL (H)). Liver Function Tests: Recent Labs  Lab 11/15/17 2040 11/16/17 0551 11/17/17 0148 11/19/17 0913  AST 24 22  --   --   ALT 17 18  --   --   ALKPHOS 117 121  --   --   BILITOT 1.1 1.2  --   --   PROT 6.9 6.8  --   --   ALBUMIN 3.6 3.4* 3.2* 3.0*   No results for input(s): LIPASE, AMYLASE in the last 168 hours. No results for input(s): AMMONIA in the last 168 hours. Coagulation Profile: Recent Labs  Lab 11/16/17 0551 11/17/17  1048 11/18/17 0420 11/19/17 0346  INR 1.92 1.79 1.55 1.70   Cardiac Enzymes: No results for input(s): CKTOTAL, CKMB, CKMBINDEX, TROPONINI in the last 168 hours. BNP (last 3 results) No results for input(s): PROBNP in the last 8760 hours. HbA1C: No results for input(s): HGBA1C in the last 72 hours. CBG: Recent Labs  Lab 11/18/17 1712 11/18/17 2125 11/19/17 0718 11/19/17 1645 11/19/17 2145  GLUCAP 113* 88 120* 99 130*   Lipid Profile: No results for input(s): CHOL, HDL, LDLCALC, TRIG, CHOLHDL, LDLDIRECT in the last 72 hours. Thyroid Function Tests: No results for input(s): TSH, T4TOTAL, FREET4, T3FREE, THYROIDAB in the last 72 hours. Anemia Panel: No results for input(s): VITAMINB12, FOLATE, FERRITIN, TIBC, IRON, RETICCTPCT in the last 72 hours. Urine analysis:    Component Value Date/Time   COLORURINE AMBER (A) 12/18/2016 0625   APPEARANCEUR HAZY (A) 12/18/2016 0625   LABSPEC 1.021 12/18/2016 0625   PHURINE 6.0 12/18/2016 0625   GLUCOSEU 150 (A) 12/18/2016 0625   HGBUR NEGATIVE 12/18/2016 0625   BILIRUBINUR NEGATIVE 12/18/2016 0625   KETONESUR NEGATIVE 12/18/2016 0625   PROTEINUR 100 (A) 12/18/2016 0625   UROBILINOGEN 0.2 03/02/2014 1508   NITRITE NEGATIVE 12/18/2016 0625   LEUKOCYTESUR SMALL (A) 12/18/2016 0625    Sepsis Labs: @LABRCNTIP (procalcitonin:4,lacticidven:4)  ) Recent Results (from the past 240 hour(s))  MRSA PCR Screening     Status: None   Collection Time: 11/16/17  2:29 AM  Result Value Ref Range Status   MRSA by PCR NEGATIVE NEGATIVE Final    Comment:        The GeneXpert MRSA Assay (FDA approved for NASAL specimens only), is one component of a comprehensive MRSA colonization surveillance program. It is not intended to diagnose MRSA infection nor to guide or monitor treatment for MRSA infections. Performed at [facility], [place] [address], [place], [place] [address]       Studies: No results found.  Scheduled Meds: . aspirin EC  81 mg Oral Daily  . atorvastatin  80 mg Oral Daily  . Chlorhexidine Gluconate Cloth  6 each Topical Q0600  . feeding supplement (PRO-STAT SUGAR FREE 64)  30 mL Oral BID  . ferric citrate  210 mg Oral TID WC  . insulin aspart  0-9 Units Subcutaneous TID WC  . insulin aspart  2 Units Subcutaneous TID WC  . insulin detemir  15 Units Subcutaneous Daily  . metoprolol tartrate  25 mg Oral BID  . risperiDONE  0.5 mg Oral QHS  . rOPINIRole  0.25 mg Oral QHS  . sertraline  200 mg Oral Daily  . Warfarin - Pharmacist Dosing Inpatient   Does not apply q1800    Continuous Infusions: . sodium chloride 10 mL/hr at 11/16/17 0919  . piperacillin-tazobactam (ZOSYN)  IV 3.375 g (11/19/17 2217)     LOS: 4 days     Desiree Hane, MD [facility] Pager [phone]  If 7PM-7AM, please contact night-coverage [email or web address] Password [email or web address] 11/19/2017, 11:47 PM

## 2017-11-19 NOTE — Progress Notes (Signed)
PT Cancellation Note  Patient Details Name: Briana [name] MRN: [id number] DOB: January 27, 1946   Cancelled Treatment:    Reason Eval/Treat Not Completed: Patient at procedure or test/unavailable. Will follow-up for PT treatment as schedule permits.  Mabeline Caras, PT, DPT Acute Rehab Services  Pager: [place] 11/19/2017, 8:33 AM

## 2017-11-19 NOTE — Progress Notes (Addendum)
[facility] Progress Note   Overview: Briana J Watlingtonis a 72 y.o.femalewith ESRD, CAD (s/p CABG, Hx ICD), A-fib (on warfarin), carotid disease (s/p B endarterectomy), dCHF, Type 2 DM, PVD, and OSA who was been admitted with dyspnea and visual hallucinations.   Subjective:  Seen in HD, 3L UF goal today. Remains edematous and requiring 4L/min nasal oxygen. Denies CP or abd pain today.  Objective Vitals:   11/19/17 0900 11/19/17 0930 11/19/17 1000 11/19/17 1030  BP: (!) 129/55 (!) 146/56 125/66 (!) 126/50  Pulse: 60 63 (!) 54 60  Resp: 20 (!) 21 (!) 22 (!) 23  Temp:      TempSrc:      SpO2: 98%   98%  Weight:      Height:       Physical Exam General: Well developed, NAD. On nasal oxygen Heart: RRR; no murmur Lungs: Bibasilar rales Extremities: 1- 2+ pitting LE edema to thighs, improved slightly Dialysis Access: L AVF + thrill    Additional Objective Labs: Basic Metabolic Panel: Recent Labs  Lab 11/16/17 0551 11/17/17 0148 11/19/17 0913  NA 138 135 134*  K 4.3 4.6 3.6  CL 96* 92* 94*  CO2 32 29 25  GLUCOSE 246* 294* 185*  BUN 24* 36* 31*  CREATININE 3.98* 5.16* 4.98*  CALCIUM 8.8* 8.6* 8.6*  PHOS  --  4.7* 4.4   Liver Function Tests: Recent Labs  Lab 11/15/17 2040 11/16/17 0551 11/17/17 0148 11/19/17 0913  AST 24 22  --   --   ALT 17 18  --   --   ALKPHOS 117 121  --   --   BILITOT 1.1 1.2  --   --   PROT 6.9 6.8  --   --   ALBUMIN 3.6 3.4* 3.2* 3.0*   CBC: Recent Labs  Lab 11/15/17 2040 11/16/17 0551 11/17/17 0149 11/19/17 0913  WBC 6.6 6.3 7.8 6.1  HGB 12.6 12.1 11.7* 11.3*  HCT 40.9 39.8 39.1 37.1  MCV 100.7* 99.7 101.8* 99.5  PLT 115* 133* 146* 132*   Studies/Results: Dg Chest Port 1 View  Result Date: 11/17/2017 CLINICAL DATA:  Hypoxia. EXAM: PORTABLE CHEST 1 VIEW COMPARISON:  November 15, 2016 FINDINGS: Stable cardiomegaly. The hila and mediastinum are unremarkable. Effusion and opacity in left base is stable. Mild  pulmonary venous congestion. Mild fluid in the right-sided fissure. No other interval changes. IMPRESSION: 1. The small left effusion and underlying opacity is stable. 2. Mild pulmonary venous congestion. Mild fluid in the right minor fissure. These findings are stable as well. Electronically Signed   By: Dorise Bullion III M.D   On: 11/17/2017 12:43   Medications: . sodium chloride 10 mL/hr at 11/16/17 0919  . piperacillin-tazobactam (ZOSYN)  IV 3.375 g (11/18/17 2208)   . aspirin EC  81 mg Oral Daily  . atorvastatin  80 mg Oral Daily  . Chlorhexidine Gluconate Cloth  6 each Topical Q0600  . feeding supplement (PRO-STAT SUGAR FREE 64)  30 mL Oral BID  . ferric citrate  210 mg Oral TID WC  . insulin aspart  0-9 Units Subcutaneous TID WC  . insulin aspart  2 Units Subcutaneous TID WC  . insulin detemir  15 Units Subcutaneous Daily  . metoprolol tartrate  50 mg Oral BID  . risperiDONE  0.5 mg Oral QHS  . rOPINIRole  0.25 mg Oral QHS  . sertraline  200 mg Oral Daily  . warfarin  7.5 mg Oral ONCE-1800  .  Warfarin - Pharmacist Dosing Inpatient   Does not apply q1800   6/24 CXR - bilat pulm edema mod severe  Dialysis Orders: TTS at [facility] 4h  400/A1.5   100.5kg   2/2.5 bath  AVF   Heparin none  Assessment/Plan: 1. Dyspnea/pulm edema: S/p HD 6/23 and 6/24 overnight. Was 4-5kg up on admission, now is at dry wt pre HD today.  Still LE edema, suspect pulm edema is resolving. Cont vol lowering w/ HD today and tomorrow w max UF and lower edw as tolerated.  2. Visual hallucinations: Head CT without acute findings, ?possible old CVA. Per primary.  3. ESRD: HD today then tomorrow to get back on sched.  4. Hypertension/volume: will lower metoprolol w/ lowering of edw 5. Anemia: Hgb 11.3, no ESA needs for now. 6. Metabolic bone disease: Ca/Phos ok, continue home binder Lorin Picket). 7.  Nutrition: Alb low, continue pro-stat supplement. 8. CAD (s/p CABG/ICD): Trop 0.2, per  primary. 9. Type 2 DM: On insulin, per primary. 10. PVD 11. A-fib (on warfarin)   Veneta Penton, PA-C 11/19/2017, 10:58 AM  [facility] Pager: [phone]  Pt seen, examined and agree w A/P as above.  Kelly Splinter MD [employer] pager [phone]   11/19/2017, 11:23 AM

## 2017-11-19 NOTE — Progress Notes (Signed)
Inpatient Rehabilitation-Admissions Coordinator   Yesterday afternoon (6/26), [facility] met with pt at the bedside and sitter in room as follow up from PM&R consult. Pt pleasantly confused when [facility] presented options for rehab. AC spoke with daughter over the phone this morning (6/27) and discussed recommendations for rehab venue (SNF). Daughter has been informed that the pt is not a candidate for CIR at this time. AC has contacted SW regarding recommendation. CIR will sign off at this time. Please call for questions.   Jhonnie Garner, OTR/L  Rehab Admissions Coordinator  [phone] 11/20/2017 10:02 AM

## 2017-11-20 LAB — PROTIME-INR
INR: 2.13
Prothrombin Time: 23.6 seconds — ABNORMAL HIGH (ref 11.4–15.2)

## 2017-11-20 LAB — GLUCOSE, CAPILLARY
GLUCOSE-CAPILLARY: 117 mg/dL — AB (ref 70–99)
Glucose-Capillary: 96 mg/dL (ref 70–99)
Glucose-Capillary: 106 mg/dL — ABNORMAL HIGH (ref 70–99)

## 2017-11-20 MED ORDER — AMOXICILLIN-POT CLAVULANATE 875-125 MG PO TABS: 1.0000 | ORAL_TABLET | Freq: Two times a day (BID) | ORAL | Status: DC

## 2017-11-20 MED ORDER — AMOXICILLIN-POT CLAVULANATE 500-125 MG PO TABS
1.0000 | ORAL_TABLET | Freq: Every day | ORAL | Status: AC
  Administered 2017-11-20 – 2017-11-22 (×3): 500 mg via ORAL
  Filled 2017-11-20 (×3): qty 1

## 2017-11-20 MED ORDER — WARFARIN SODIUM 5 MG PO TABS
5.0000 mg | ORAL_TABLET | Freq: Once | ORAL | Status: AC
  Administered 2017-11-20: 5 mg via ORAL
  Filled 2017-11-20: qty 1

## 2017-11-20 MED ORDER — ACETAMINOPHEN 325 MG PO TABS
650.0000 mg | ORAL_TABLET | Freq: Four times a day (QID) | ORAL | Status: DC | PRN
  Administered 2017-11-20: 650 mg via ORAL
  Filled 2017-11-20: qty 2

## 2017-11-20 NOTE — Progress Notes (Signed)
No CPAP at bedside- pt declined use.  Machine available if patient decides to use during this visit.

## 2017-11-20 NOTE — Care Management Note (Addendum)
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  conts with sitter, plan for snf vs cir.     7/3 Briana Bamberger RN, BSN - patient is for residential hospice.                Action/Plan: NCM will follow for dc needs.   Expected Discharge Date:                  Expected Discharge Plan:  Skilled Nursing Facility  In-House Referral:  Clinical Social Work  Discharge planning Services  CM Consult  Post Acute Care Choice:    Choice offered to:     DME Arranged:    DME Agency:     HH Arranged:    [place] Agency:     Status of Service:  In process, will continue to follow  If discussed at Long Length of Stay Meetings, dates discussed:    Additional Comments:  Briana Mayo, RN 11/20/2017, 1:44 PM

## 2017-11-20 NOTE — Progress Notes (Signed)
CSW spoke with pt dtr regarding SNF choices- they would like [facility] for rehab  Pt will need to be safe without sitter prior to DC to SNF  Jorge Ny, [place] Social Worker [phone]

## 2017-11-20 NOTE — Progress Notes (Signed)
PROGRESS NOTE  Briana [name] TOI:[id number] DOB: Sep 28, 1945 DOA: 11/15/2017 PCP: Vidal Schwalbe, MD  HPI  Briana [name] is a 72 y.o. year old female with medical history significant for CAD with CABG 2005, right posterior occipital infarction, nocturnal hypoxia with 2 L oxygen at night, hypertension with LVH, end-stage renal disease with hemodialysis, chronic diastolic congestive heart failure/PAF with chronic Coumadin status post AICD placement  who presented on 11/15/2017 with daughter reporting visual hallucinations and was found to have significant fluid overload.  Interval History No acute events   ROS:    Subjective Did well with OT today  Assessment/Plan: Principal Problem:   Delirium due to another medical condition Active Problems:   Mixed hyperlipidemia   Pulmonary hypertension (HCC)   Physical deconditioning   Debility   Hx of CABG 2005   Paroxysmal atrial fibrillation (HCC)   Diabetes mellitus with end-stage renal disease ([place])   ESRD (end stage renal disease) on dialysis ([place])   Depression   Essential hypertension, benign   HCAP (healthcare-associated pneumonia)   Delirium   History of CVA (cerebrovascular accident)   Supplemental oxygen dependent   Chronic diastolic congestive heart failure (HCC)   ESRD on dialysis (HCC)   PAF (paroxysmal atrial fibrillation) (HCC)   Diabetes mellitus type 2 in obese (HCC)   Anemia of chronic disease   Thrombocytopenia (HCC)   Acute on chronic hypoxic respiratory failure, secondary to volume overload, improving. CXR showed pulmonary congestion. Has weaned o2 from 5 L now to 2 L (uses it nightly at home). Lowering dry weight with consecutive HD sessions. Left sided opacity on CXR deescalated treated for possible PNA from zosyn to agumentin  ESRD on HD. Was 4-5 kg up in weight on admission now 2kg below dry weight per nephrology. Will continue max UF to lower dry weight today per nephrology. Metoprolol lowered given  lowering of dry weight. Nephrology managing volume status with HD  PAF, rate controlled. Continue coumadin and lopressor 25 mg BID  CAD s/p CABG. No chest pain. Continue home meds  Visual hallucinations, resolved. Completely alert and oriented. Likely hospital acquired delirium and some BZ withdrawal. Psychiatry recommended Risperdal 0.5 mg daily for psychosis( nightly if causes sedation) for short term use and to avoid EPS. Continue zofolof and valium for depression and anxiety  HLD, stable, home lipitor  T2DM, A1c 9.6 stable. Glucose 130-190 here.  Levemir 15 U qd, Novolog 2 u TID with meals Code Status: Full Code   Family Communication: will call daughter   Disposition Plan: stable volume status,  PT recs SNF   Consultants:  Nephrology, Psychiatry  Procedures:  none  Antimicrobials: Anti-infectives (From admission, onward)   Start     Dose/Rate Route Frequency Ordered Stop   11/18/17 1200  vancomycin (VANCOCIN) IVPB 750 mg/150 ml premix  Status:  Discontinued     750 mg 150 mL/hr over 60 Minutes Intravenous Every T-Th-Sa (Hemodialysis) 11/16/17 0112 11/17/17 1808   11/17/17 0230  vancomycin (VANCOCIN) IVPB 750 mg/150 ml premix     750 mg 150 mL/hr over 60 Minutes Intravenous  Once 11/17/17 0226 11/17/17 0513   11/16/17 1600  vancomycin (VANCOCIN) IVPB 750 mg/150 ml premix     750 mg 150 mL/hr over 60 Minutes Intravenous Every Sun (Hemodialysis) 11/16/17 1234 11/17/17 0428   11/16/17 0130  vancomycin (VANCOCIN) 1,500 mg in sodium chloride 0.9 % 500 mL IVPB     1,500 mg 250 mL/hr over 120 Minutes Intravenous  Once 11/16/17 0112  11/16/17 0836   11/16/17 0130  piperacillin-tazobactam (ZOSYN) IVPB 3.375 g     3.375 g 12.5 mL/hr over 240 Minutes Intravenous Every 12 hours 11/16/17 0112     11/15/17 2245  cefTRIAXone (ROCEPHIN) 1 g in sodium chloride 0.9 % 100 mL IVPB     1 g 200 mL/hr over 30 Minutes Intravenous  Once 11/15/17 2242 11/15/17 2350   11/15/17 2245   azithromycin (ZITHROMAX) 500 mg in sodium chloride 0.9 % 250 mL IVPB  Status:  Discontinued     500 mg 250 mL/hr over 60 Minutes Intravenous  Once 11/15/17 2242 11/16/17 0104             DVT prophylaxis:  coumadin   Objective: Vitals:   11/20/17 0025 11/20/17 0254 11/20/17 0628 11/20/17 0755  BP: 113/87 (!) 131/47 (!) 132/38 (!) 147/44  Pulse:  79 64 66  Resp:  16 18 19   Temp:  98.7 F (37.1 C)  99.2 F (37.3 C)  TempSrc:  Oral  Oral  SpO2:  92% 93% 95%  Weight:      Height:        Intake/Output Summary (Last 24 hours) at 11/20/2017 0919 Last data filed at 11/20/2017 0400 Gross per 24 hour  Intake 530.04 ml  Output 3000 ml  Net -2469.96 ml   Filed Weights   11/18/17 0437 11/19/17 0845 11/19/17 1305  Weight: 101.3 kg (223 lb 5.2 oz) 101 kg (222 lb 10.6 oz) 98 kg (216 lb 0.8 oz)    Exam:  Constitutional:normal appearing female, no distress Eyes: EOMI, anicteric, normal conjunctivae ENMT: Oropharynx with moist mucous membranes, Cardiovascular: irregularly irregular, no MRGs, with trace ankle edema Respiratory: Normal respiratory effort on 2.5L, crackles mainly at bases Abdomen: Soft,non-tender, Skin: No rash ulcers, or lesions. Without skin tenting  Neurologic: Grossly no focal neuro deficit. Psychiatric:Appropriate affect, and mood. Mental status Alert and oriented to person, place and time (improved greatly from prior exam) Data Reviewed: CBC: Recent Labs  Lab 11/15/17 2040 11/16/17 0551 11/17/17 0149 11/19/17 0913  WBC 6.6 6.3 7.8 6.1  HGB 12.6 12.1 11.7* 11.3*  HCT 40.9 39.8 39.1 37.1  MCV 100.7* 99.7 101.8* 99.5  PLT 115* 133* 146* 878*   Basic Metabolic Panel: Recent Labs  Lab 11/15/17 2040 11/16/17 0551 11/17/17 0148 11/19/17 0913  NA 133* 138 135 134*  K 4.7 4.3 4.6 3.6  CL 92* 96* 92* 94*  CO2 29 32 29 25  GLUCOSE 446* 246* 294* 185*  BUN 23* 24* 36* 31*  CREATININE 3.77* 3.98* 5.16* 4.98*  CALCIUM 8.7* 8.8* 8.6* 8.6*  PHOS  --    --  4.7* 4.4   GFR: Estimated Creatinine Clearance: 11.6 mL/min (A) (by C-G formula based on SCr of 4.98 mg/dL (H)). Liver Function Tests: Recent Labs  Lab 11/15/17 2040 11/16/17 0551 11/17/17 0148 11/19/17 0913  AST 24 22  --   --   ALT 17 18  --   --   ALKPHOS 117 121  --   --   BILITOT 1.1 1.2  --   --   PROT 6.9 6.8  --   --   ALBUMIN 3.6 3.4* 3.2* 3.0*   No results for input(s): LIPASE, AMYLASE in the last 168 hours. No results for input(s): AMMONIA in the last 168 hours. Coagulation Profile: Recent Labs  Lab 11/16/17 0551 11/17/17 1048 11/18/17 0420 11/19/17 0346 11/20/17 0319  INR 1.92 1.79 1.55 1.70 2.13   Cardiac Enzymes: No results for input(s):  CKTOTAL, CKMB, CKMBINDEX, TROPONINI in the last 168 hours. BNP (last 3 results) No results for input(s): PROBNP in the last 8760 hours. HbA1C: No results for input(s): HGBA1C in the last 72 hours. CBG: Recent Labs  Lab 11/18/17 2125 11/19/17 0718 11/19/17 1645 11/19/17 2145 11/20/17 0750  GLUCAP 88 120* 99 130* 96   Lipid Profile: No results for input(s): CHOL, HDL, LDLCALC, TRIG, CHOLHDL, LDLDIRECT in the last 72 hours. Thyroid Function Tests: No results for input(s): TSH, T4TOTAL, FREET4, T3FREE, THYROIDAB in the last 72 hours. Anemia Panel: No results for input(s): VITAMINB12, FOLATE, FERRITIN, TIBC, IRON, RETICCTPCT in the last 72 hours. Urine analysis:    Component Value Date/Time   COLORURINE AMBER (A) 12/18/2016 0625   APPEARANCEUR HAZY (A) 12/18/2016 0625   LABSPEC 1.021 12/18/2016 0625   PHURINE 6.0 12/18/2016 0625   GLUCOSEU 150 (A) 12/18/2016 0625   HGBUR NEGATIVE 12/18/2016 0625   BILIRUBINUR NEGATIVE 12/18/2016 0625   KETONESUR NEGATIVE 12/18/2016 0625   PROTEINUR 100 (A) 12/18/2016 0625   UROBILINOGEN 0.2 03/02/2014 1508   NITRITE NEGATIVE 12/18/2016 0625   LEUKOCYTESUR SMALL (A) 12/18/2016 0625   Sepsis Labs: @LABRCNTIP (procalcitonin:4,lacticidven:4)  ) Recent Results (from the  past 240 hour(s))  MRSA PCR Screening     Status: None   Collection Time: 11/16/17  2:29 AM  Result Value Ref Range Status   MRSA by PCR NEGATIVE NEGATIVE Final    Comment:        The GeneXpert MRSA Assay (FDA approved for NASAL specimens only), is one component of a comprehensive MRSA colonization surveillance program. It is not intended to diagnose MRSA infection nor to guide or monitor treatment for MRSA infections. Performed at [facility], [place] [address] [place], [place] [address]       Studies: No results found.  Scheduled Meds: . aspirin EC  81 mg Oral Daily  . atorvastatin  80 mg Oral Daily  . Chlorhexidine Gluconate Cloth  6 each Topical Q0600  . feeding supplement (PRO-STAT SUGAR FREE 64)  30 mL Oral BID  . ferric citrate  210 mg Oral TID WC  . insulin aspart  0-9 Units Subcutaneous TID WC  . insulin aspart  2 Units Subcutaneous TID WC  . insulin detemir  15 Units Subcutaneous Daily  . metoprolol tartrate  25 mg Oral BID  . risperiDONE  0.5 mg Oral QHS  . rOPINIRole  0.25 mg Oral QHS  . sertraline  200 mg Oral Daily  . Warfarin - Pharmacist Dosing Inpatient   Does not apply q1800    Continuous Infusions: . sodium chloride 10 mL/hr at 11/16/17 0919  . piperacillin-tazobactam (ZOSYN)  IV 3.375 g (11/19/17 2217)     LOS: 5 days     Desiree Hane, MD [facility] Pager [phone]  If 7PM-7AM, please contact night-coverage [email or web address] Password [facility] 11/20/2017, 9:19 AM

## 2017-11-20 NOTE — Progress Notes (Signed)
Pt declined use of CPAP 

## 2017-11-20 NOTE — Progress Notes (Signed)
[facility] Progress Note   Subjective:  Seen in room, up in chair, still seeing things and "not making sense" per the daughter.  CIR declined admission, daughter upset.  Patient able to answer questions.   Objective Vitals:   11/20/17 0254 11/20/17 0628 11/20/17 0755 11/20/17 1126  BP: (!) 131/47 (!) 132/38 (!) 147/44 (!) 141/46  Pulse: 79 64 66 61  Resp: 16 18 19  (!) 24  Temp: 98.7 F (37.1 C)  99.2 F (37.3 C) 98.7 F (37.1 C)  TempSrc: Oral  Oral Oral  SpO2: 92% 93% 95% 97%  Weight:      Height:       Physical Exam General: Well developed, NAD. On nasal oxygen Heart: RRR; no murmur Lungs: clear bilat to the bases Extremities: trace edema LE's Dialysis Access: L AVF + thrill    Additional Objective Labs: Basic Metabolic Panel: Recent Labs  Lab 11/16/17 0551 11/17/17 0148 11/19/17 0913  NA 138 135 134*  K 4.3 4.6 3.6  CL 96* 92* 94*  CO2 32 29 25  GLUCOSE 246* 294* 185*  BUN 24* 36* 31*  CREATININE 3.98* 5.16* 4.98*  CALCIUM 8.8* 8.6* 8.6*  PHOS  --  4.7* 4.4   Liver Function Tests: Recent Labs  Lab 11/15/17 2040 11/16/17 0551 11/17/17 0148 11/19/17 0913  AST 24 22  --   --   ALT 17 18  --   --   ALKPHOS 117 121  --   --   BILITOT 1.1 1.2  --   --   PROT 6.9 6.8  --   --   ALBUMIN 3.6 3.4* 3.2* 3.0*   CBC: Recent Labs  Lab 11/15/17 2040 11/16/17 0551 11/17/17 0149 11/19/17 0913  WBC 6.6 6.3 7.8 6.1  HGB 12.6 12.1 11.7* 11.3*  HCT 40.9 39.8 39.1 37.1  MCV 100.7* 99.7 101.8* 99.5  PLT 115* 133* 146* 132*   Studies/Results: No results found. Medications: . sodium chloride 10 mL/hr at 11/16/17 0919   . amoxicillin-clavulanate  1 tablet Oral Daily  . aspirin EC  81 mg Oral Daily  . atorvastatin  80 mg Oral Daily  . Chlorhexidine Gluconate Cloth  6 each Topical Q0600  . feeding supplement (PRO-STAT SUGAR FREE 64)  30 mL Oral BID  . ferric citrate  210 mg Oral TID WC  . insulin aspart  0-9 Units Subcutaneous TID WC  .  insulin aspart  2 Units Subcutaneous TID WC  . insulin detemir  15 Units Subcutaneous Daily  . metoprolol tartrate  25 mg Oral BID  . risperiDONE  0.5 mg Oral QHS  . rOPINIRole  0.25 mg Oral QHS  . sertraline  200 mg Oral Daily  . warfarin  5 mg Oral ONCE-1800  . Warfarin - Pharmacist Dosing Inpatient   Does not apply q1800   6/24 CXR - bilat pulm edema mod severe  Dialysis Orders: [facility] at [facility] 4h  400/A1.5   100.5kg   2/2.5 bath  AVF   Heparin none  Assessment/Plan: 1. Dyspnea/pulm edema: 7 kg down from admission and 2 kg under prior dry wt.  Acute pulm edema resolved clinically. Lower dry wt at dc.  Lower at little more while here.  2. Hallucinations: fluid overload or pulm edema is not the cause of this.  3. ESRD: HD again today to get back on schedule.  4. HTN - metoprolol dose lowered, BP's good 5. Anemia: Hgb 11.3, no ESA needs for now. 6. Metabolic bone  disease: Ca/Phos ok, continue home binder Lorin Picket). 7.  Nutrition: Alb low, continue pro-stat supplement. 8. CAD (s/p CABG/ICD): Trop 0.2, per primary. 9. Type 2 DM: On insulin, per primary. 10. PVD 11. A-fib (on warfarin)   Kelly Splinter MD [facility] pager [phone]   11/20/2017, 1:33 PM

## 2017-11-20 NOTE — Progress Notes (Signed)
Patient remained confused most of the night only alert to self. Nighttime medications were held due to extreme lethargy patient was barley able to complete a sentence without falling asleep. Kirby,NP notified  This morning patient was much more alert, reported seeing little girls in her room and still unable to recall anything else besides her name.

## 2017-11-20 NOTE — Progress Notes (Signed)
Occupational Therapy Treatment Patient Details Name: Briana [name] MRN: [id number] DOB: 28-May-1945 Today's Date: 11/20/2017    History of present illness 72 yo female with onset of HCAP and noted hallucinations, cardiomegaly and CHF was admitted for medical care.  Has hypokinesis of inferior myocardium.  On 5-6 L O2 in hosp, but PAF is controlled per MD notes.  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,    OT comments  Pt progressing well towards acute OT goals. Grooming session in standing position. Tolerated well but fatigued at end of session. Pt asking questions about what brought her to the hospital. Reports her last memory was being in the store and then she was in the hospital. D/c plan remains appropriate.   Follow Up Recommendations  SNF    Equipment Recommendations       Recommendations for Other Services      Precautions / Restrictions Precautions Precautions: Fall Restrictions Weight Bearing Restrictions: No       Mobility Bed Mobility Overal bed mobility: Needs Assistance Bed Mobility: Supine to Sit     Supine to sit: Min guard;HOB elevated        Transfers Overall transfer level: Needs assistance Equipment used: Rolling walker (2 wheeled) Transfers: Sit to/from Stand Sit to Stand: Min guard;Min assist         General transfer comment: assist to steady    Balance Overall balance assessment: Needs assistance Sitting-balance support: Feet supported;No upper extremity supported Sitting balance-Leahy Scale: Fair     Standing balance support: Bilateral upper extremity supported;During functional activity Standing balance-Leahy Scale: Poor Standing balance comment: rw for support                           ADL either performed or assessed with clinical judgement   ADL Overall ADL's : Needs assistance/impaired     Grooming: Min guard;Oral care;Wash/dry  hands;Standing Grooming Details (indicate cue type and reason): stood to complete 2 tasks with min guard assist. Asking for container to soak dentures in. fatigued after tasks.                             Functional mobility during ADLs: Minimal assistance;Rolling walker General ADL Comments: Pt completed functional mobility EOB>sink. Stood to complete 2 grooming tasks then sat in recliner.      Vision       Perception     Praxis      Cognition Arousal/Alertness: Awake/alert Behavior During Therapy: WFL for tasks assessed/performed Overall Cognitive Status: No family/caregiver present to determine baseline cognitive functioning Area of Impairment: Orientation;Attention;Memory;Following commands;Safety/judgement;Awareness;Problem solving                 Orientation Level: Time;Situation Current Attention Level: Sustained Memory: Decreased short-term memory;Decreased recall of precautions Following Commands: Follows one step commands consistently Safety/Judgement: Decreased awareness of safety;Decreased awareness of deficits Awareness: Emergent Problem Solving: Requires verbal cues;Decreased initiation;Slow processing General Comments: Pt asking about what brought her to the hospital. She reports the last thing she remembers is being in the store and then she was in the hospital. Stated year as 2019, month as May. Reoriented to month.         Exercises     Shoulder Instructions       General Comments      Pertinent Vitals/ Pain  Pain Assessment: No/denies pain  Home Living                                          Prior Functioning/Environment              Frequency  Min 2X/week        Progress Toward Goals  OT Goals(current goals can now be found in the care plan section)  Progress towards OT goals: Progressing toward goals  Acute Rehab OT Goals Patient Stated Goal: none stated  Plan Discharge plan remains  appropriate    Co-evaluation                 AM-PAC PT "6 Clicks" Daily Activity     Outcome Measure   Help from another person eating meals?: None Help from another person taking care of personal grooming?: A Little Help from another person toileting, which includes using toliet, bedpan, or urinal?: A Little Help from another person bathing (including washing, rinsing, drying)?: A Little Help from another person to put on and taking off regular upper body clothing?: A Little Help from another person to put on and taking off regular lower body clothing?: A Little 6 Click Score: 19    End of Session Equipment Utilized During Treatment: Gait belt;Rolling walker;Oxygen  OT Visit Diagnosis: Other abnormalities of gait and mobility (R26.89);Unsteadiness on feet (R26.81);Muscle weakness (generalized) (M62.81);Other symptoms and signs involving cognitive function   Activity Tolerance Patient tolerated treatment well   Patient Left in chair;with call bell/phone within reach;with chair alarm set;with nursing/sitter in room   Nurse Communication          Time: 8264-1583 OT Time Calculation (min): 32 min  Charges: OT General Charges $OT Visit: 1 Visit OT Treatments $Self Care/Home Management : 23-37 mins     Hortencia Pilar 11/20/2017, 1:30 PM

## 2017-11-20 NOTE — Progress Notes (Signed)
ANTICOAGULATION CONSULT NOTE - Follow-Up Consult  Pharmacy Consult for Coumadin Indication: atrial fibrillation  Patient Measurements: Height: 5\' 4"  (162.6 cm) Weight: 216 lb 0.8 oz (98 kg) IBW/kg (Calculated) : 54.7  Vital Signs: Temp: 99.2 F (37.3 C) (06/27 0755) Temp Source: Oral (06/27 0755) BP: 147/44 (06/27 0755) Pulse Rate: 66 (06/27 0755)  Labs: Recent Labs    11/18/17 0420 11/19/17 0346 11/19/17 0913 11/20/17 0319  HGB  --   --  11.3*  --   HCT  --   --  37.1  --   PLT  --   --  132*  --   LABPROT 18.4* 19.8*  --  23.6*  INR 1.55 1.70  --  2.13  CREATININE  --   --  4.98*  --     Estimated Creatinine Clearance: 11.6 mL/min (A) (by C-G formula based on SCr of 4.98 mg/dL (H)).  Assessment: 72 y.o. female admitted with PNA, h/o Afib, to continue warfarin. Admit INR 1.92 on PTA dose - last dose noted to be 6/21. Pharmacy consulted to dose this admission.    PTA dose of warfarin noted to be 5mg  all days except 2.5mg  on Sundays.   INR therapeutic today at 2.13  Goal of Therapy:  INR 2-3 Monitor platelets by anticoagulation protocol: Yes   Plan:  Warfarin 5 mg po x 1  Daily INR/ CBC   Thank you Anette Guarneri, PharmD [phone] 11/20/2017 9:43 AM

## 2017-11-20 NOTE — Progress Notes (Signed)
Physical Therapy Treatment Patient Details Name: Briana [name] MRN: [id number] DOB: [birth year] Today's Date: 11/20/2017    History of Present Illness Pt is a 72 y.o. female admitted 11/15/17 with onset of HCAP and noted hallucinations, cardiomegaly and CHF. Has hypokinesis of inferior myocardium. 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,    PT Comments    Pt progressing with mobility. Performed multiple bouts of marching in place/short ambulation distance with RW and min guard for balance (no portable tele box available); required seated rest break after 30-45 sec activity secondary to fatigue and SOB. Agreeable to seated BLE therex prior to arrival of transport for HD. SpO2 down to 82% on RA, returning to 95% on 3L O2 [place]. Continue to recommend SNF-level therapies.    Follow Up Recommendations  SNF;Supervision for mobility/OOB     Equipment Recommendations  Rolling walker with 5" wheels    Recommendations for Other Services       Precautions / Restrictions Precautions Precautions: Fall Restrictions Weight Bearing Restrictions: No    Mobility  Bed Mobility Overal bed mobility: Needs Assistance Bed Mobility: Sit to Supine     Supine to sit: Min guard;HOB elevated Sit to supine: Min guard   General bed mobility comments: Increased time and effort, no physical assist required  Transfers Overall transfer level: Needs assistance Equipment used: Rolling walker (2 wheeled) Transfers: Sit to/from Stand Sit to Stand: Min guard         General transfer comment: Performed 3x sit<>stand with RW and min guard for balance; repeated cues for safety, including correct hand placement and eccentric control when sitting  Ambulation/Gait Ambulation/Gait assistance: Min guard   Assistive device: Rolling walker (2 wheeled)   Gait velocity: Decreased   General Gait Details: Portable tele box not available for  ambulation beyond [employer] ethernet cord. Performed 3x bouts of marching in place and forwards/backwards with RW; pt easily fatigued with ~30 sec of this, requiring seated rest break between bouts. Min guard for balance throughout   Stairs             Wheelchair Mobility    Modified Rankin (Stroke Patients Only)       Balance Overall balance assessment: Needs assistance Sitting-balance support: Feet supported;No upper extremity supported Sitting balance-Leahy Scale: Fair     Standing balance support: During functional activity;Bilateral upper extremity supported;Single extremity supported Standing balance-Leahy Scale: Poor Standing balance comment: Reliant on at least single UEs upport                            Cognition Arousal/Alertness: Awake/alert Behavior During Therapy: WFL for tasks assessed/performed Overall Cognitive Status: No family/caregiver present to determine baseline cognitive functioning Area of Impairment: Orientation;Attention;Memory;Following commands;Safety/judgement;Awareness;Problem solving                 Orientation Level: Disoriented to;Time;Situation Current Attention Level: Sustained Memory: Decreased short-term memory Following Commands: Follows one step commands consistently;Follows multi-step commands inconsistently Safety/Judgement: Decreased awareness of safety;Decreased awareness of deficits Awareness: Emergent Problem Solving: Requires verbal cues;Decreased initiation;Slow processing General Comments: Reports no hallucinations today. Poor attention and memory, asking the same questions more the once although already being answered      Exercises General Exercises - Lower Extremity Long Arc Quad: AROM;Both;Seated;10 reps Hip Flexion/Marching: AROM;Both;Seated;10 reps Toe Raises: AROM;Both;10 reps;Seated Heel Raises: AROM;Both;10 reps;Seated    General Comments  Pertinent Vitals/Pain Pain Assessment:  Faces Faces Pain Scale: Hurts a little bit Pain Location: Bilat feet Pain Descriptors / Indicators: Sore;Constant Pain Intervention(s): Monitored during session    Home Living                      Prior Function            PT Goals (current goals can now be found in the care plan section) Acute Rehab PT Goals Patient Stated Goal: Get stronger, get outside PT Goal Formulation: With patient Time For Goal Achievement: 12/01/17 Potential to Achieve Goals: Good Progress towards PT goals: Progressing toward goals    Frequency    Min 2X/week      PT Plan Current plan remains appropriate    Co-evaluation              AM-PAC PT "6 Clicks" Daily Activity  Outcome Measure  Difficulty turning over in bed (including adjusting bedclothes, sheets and blankets)?: A Little Difficulty moving from lying on back to sitting on the side of the bed? : A Little Difficulty sitting down on and standing up from a chair with arms (e.g., wheelchair, bedside commode, etc,.)?: A Little Help needed moving to and from a bed to chair (including a wheelchair)?: A Little Help needed walking in hospital room?: A Little Help needed climbing 3-5 steps with a railing? : A Lot 6 Click Score: 17    End of Session Equipment Utilized During Treatment: Gait belt;Oxygen Activity Tolerance: Patient tolerated treatment well;Patient limited by fatigue Patient left: in bed;with call bell/phone within reach;Other (comment)(with transport for HD) Nurse Communication: Mobility status PT Visit Diagnosis: Unsteadiness on feet (R26.81);Other abnormalities of gait and mobility (R26.89);Muscle weakness (generalized) (M62.81)     Time: 2111-5520 PT Time Calculation (min) (ACUTE ONLY): 24 min  Charges:  $Therapeutic Exercise: 8-22 mins $Therapeutic Activity: 8-22 mins                    G Codes:      Mabeline Caras, PT, DPT Acute Rehab Services  Pager: [place] 11/20/2017, 4:16  PM

## 2017-11-21 LAB — GLUCOSE, CAPILLARY
GLUCOSE-CAPILLARY: 69 mg/dL — AB (ref 70–99)
GLUCOSE-CAPILLARY: 202 mg/dL — AB (ref 70–99)
GLUCOSE-CAPILLARY: 211 mg/dL — AB (ref 70–99)
GLUCOSE-CAPILLARY: 132 mg/dL — AB (ref 70–99)
Glucose-Capillary: 141 mg/dL — ABNORMAL HIGH (ref 70–99)

## 2017-11-21 LAB — PROTIME-INR
INR: 2.11
Prothrombin Time: 23.5 seconds — ABNORMAL HIGH (ref 11.4–15.2)

## 2017-11-21 MED ORDER — CHLORHEXIDINE GLUCONATE CLOTH 2 % EX PADS: 6.0000 | MEDICATED_PAD | Freq: Every day | CUTANEOUS | Status: DC

## 2017-11-21 MED ORDER — INSULIN DETEMIR 100 UNIT/ML ~~LOC~~ SOLN: 10.0000 [IU] | Freq: Every day | SUBCUTANEOUS | Status: DC

## 2017-11-21 MED ORDER — INSULIN DETEMIR 100 UNIT/ML ~~LOC~~ SOLN
10.0000 [IU] | Freq: Every day | SUBCUTANEOUS | Status: DC
  Administered 2017-11-21 – 2017-11-22 (×2): 10 [IU] via SUBCUTANEOUS
  Filled 2017-11-21 (×2): qty 0.1

## 2017-11-21 MED ORDER — WARFARIN SODIUM 5 MG PO TABS
5.0000 mg | ORAL_TABLET | Freq: Once | ORAL | Status: AC
  Administered 2017-11-21: 5 mg via ORAL
  Filled 2017-11-21: qty 1

## 2017-11-21 NOTE — Progress Notes (Signed)
PROGRESS NOTE  Briana [name] OIZ:[id number] DOB: [birth year] DOA: 11/15/2017 PCP: Vidal Schwalbe, MD  HPI/Brief Narrative  Briana [name] is a 72 y.o. year old female with medical history significant for CAD with CABG 2005, right posterior occipital infarction, nocturnal hypoxia with 2 L oxygen at night, hypertension with LVH, end-stage renal disease with hemodialysis, chronic diastolic congestive heart failure/PAF with chronic Coumadin status post AICD placement  who presented on 11/15/2017 with daughter reporting new visual hallucinations and was found to have significant fluid overload and presumed PNA.  Interval History No acute events   ROS:    Subjective Very tearful and afraid she's damaged relationship with her daughter  Assessment/Plan: Principal Problem:   Delirium due to another medical condition Active Problems:   Mixed hyperlipidemia   Pulmonary hypertension (HCC)   Physical deconditioning   Debility   Hx of CABG 2005   Paroxysmal atrial fibrillation (HCC)   Diabetes mellitus with end-stage renal disease ([place])   ESRD (end stage renal disease) on dialysis ([place])   Depression   Essential hypertension, benign   HCAP (healthcare-associated pneumonia)   Delirium   History of CVA (cerebrovascular accident)   Supplemental oxygen dependent   Chronic diastolic congestive heart failure (HCC)   ESRD on dialysis ([place])   PAF (paroxysmal atrial fibrillation) ([place])   Diabetes mellitus type 2 in obese (HCC)   Anemia of chronic disease   Thrombocytopenia (HCC)   Acute on chronic hypoxic respiratory failure, secondary to volume overload and possible PNA, improving. CXR showed pulmonary congestion. Back on HD session schedule for outpatient. Nephrology will discuss quality of life and desire to continue dialysis.   Has weaned o2 from 5 L now to 2 L (uses it nightly at home). L  CAP. Left sided opacity on CXR deescalated treated for possible PNA from zosyn to agumentin  and doing well. To complete for 7 total days  ESRD on HD. Was 4-5 kg up in weight on admission now lower dry weight established. Goals of care discussion on continuing HD this weekend with nephrology and patient/family. Back on outpatient HD schedule of TTS.   Nephrology managing volume status with HD  PAF, rate controlled. Continue coumadin and lopressor 25 mg BID  CAD s/p CABG. No chest pain. Continue home meds  Visual hallucinations, intermittent. Completely alert and oriented for me. Denies active hallucinations but daughter witnessed today. 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 Continue risperdal as recommended by psych. Valium PRN and norco   HLD, stable, home lipitor  T2DM, A1c 9.6 stable. Lower fasting glucose < 100. Decreased levemir to 10 U. monitor.  Novolog 2 u TID with meals  Code Status: Full Code   Family Communication: will call daughter   Disposition Plan: stable volume status,  PT recs SNF   Consultants:  Nephrology, Psychiatry  Procedures:  none  Antimicrobials: Anti-infectives (From admission, onward)   Start     Dose/Rate Route Frequency Ordered Stop   11/20/17 1000  amoxicillin-clavulanate (AUGMENTIN) 875-125 MG per tablet 1 tablet  Status:  Discontinued     1 tablet Oral Every 12 hours 11/20/17 0937 11/20/17 0940   11/20/17 1000  amoxicillin-clavulanate (AUGMENTIN) 500-125 MG per tablet 500 mg     1 tablet Oral Daily 11/20/17 0940 11/23/17 0959   11/18/17 1200  vancomycin (VANCOCIN) IVPB 750 mg/150 ml premix  Status:  Discontinued     750 mg 150 mL/hr over 60 Minutes Intravenous Every T-Th-Sa (Hemodialysis) 11/16/17  0112 11/17/17 1808   11/17/17 0230  vancomycin (VANCOCIN) IVPB 750 mg/150 ml premix     750 mg 150 mL/hr over 60 Minutes Intravenous  Once 11/17/17 0226 11/17/17 0513   11/16/17 1600  vancomycin (VANCOCIN) IVPB 750 mg/150 ml premix     750 mg 150 mL/hr over 60 Minutes  Intravenous Every Sun (Hemodialysis) 11/16/17 1234 11/17/17 0428   11/16/17 0130  vancomycin (VANCOCIN) 1,500 mg in sodium chloride 0.9 % 500 mL IVPB     1,500 mg 250 mL/hr over 120 Minutes Intravenous  Once 11/16/17 0112 11/16/17 0836   11/16/17 0130  piperacillin-tazobactam (ZOSYN) IVPB 3.375 g  Status:  Discontinued     3.375 g 12.5 mL/hr over 240 Minutes Intravenous Every 12 hours 11/16/17 0112 11/20/17 0937   11/15/17 2245  cefTRIAXone (ROCEPHIN) 1 g in sodium chloride 0.9 % 100 mL IVPB     1 g 200 mL/hr over 30 Minutes Intravenous  Once 11/15/17 2242 11/15/17 2350   11/15/17 2245  azithromycin (ZITHROMAX) 500 mg in sodium chloride 0.9 % 250 mL IVPB  Status:  Discontinued     500 mg 250 mL/hr over 60 Minutes Intravenous  Once 11/15/17 2242 11/16/17 0104             DVT prophylaxis:  coumadin   Objective: Vitals:   11/21/17 0803 11/21/17 1702 11/21/17 1917 11/21/17 2035  BP: (!) 108/52 (!) 112/46 (!) 126/46   Pulse: 73 63 63 82  Resp: 19 (!) 24 19   Temp: 99 F (37.2 C) 99.3 F (37.4 C) 98.5 F (36.9 C)   TempSrc: Oral Oral Oral   SpO2: 92% 97% 94%   Weight:      Height:        Intake/Output Summary (Last 24 hours) at 11/21/2017 2046 Last data filed at 11/21/2017 1800 Gross per 24 hour  Intake 955 ml  Output -  Net 955 ml   Filed Weights   11/19/17 1305 11/20/17 1450 11/20/17 1930  Weight: 98 kg (216 lb 0.8 oz) 97.9 kg (215 lb 13.3 oz) 94.3 kg (207 lb 14.3 oz)    Exam:  Constitutional:normal appearing female, no distress Eyes: EOMI, anicteric, normal conjunctivae ENMT: Oropharynx with moist mucous membranes, Cardiovascular: irregularly irregular, no MRGs, with trace ankle edema Respiratory: Normal respiratory effort on 2.5L, crackles mainly at bases Abdomen: Soft,non-tender, Skin: No rash ulcers, or lesions. Without skin tenting  Neurologic: Grossly no focal neuro deficit. Psychiatric:Very tearful. Fixated on messing up relationship with daughter.  Alert and oriented x4 ( year, place, president, self, context). No active hallucinations on my exam.    Data Reviewed: CBC: Recent Labs  Lab 11/15/17 2040 11/16/17 0551 11/17/17 0149 11/19/17 0913  WBC 6.6 6.3 7.8 6.1  HGB 12.6 12.1 11.7* 11.3*  HCT 40.9 39.8 39.1 37.1  MCV 100.7* 99.7 101.8* 99.5  PLT 115* 133* 146* 619*   Basic Metabolic Panel: Recent Labs  Lab 11/15/17 2040 11/16/17 0551 11/17/17 0148 11/19/17 0913  NA 133* 138 135 134*  K 4.7 4.3 4.6 3.6  CL 92* 96* 92* 94*  CO2 29 32 29 25  GLUCOSE 446* 246* 294* 185*  BUN 23* 24* 36* 31*  CREATININE 3.77* 3.98* 5.16* 4.98*  CALCIUM 8.7* 8.8* 8.6* 8.6*  PHOS  --   --  4.7* 4.4   GFR: Estimated Creatinine Clearance: 11.4 mL/min (A) (by C-G formula based on SCr of 4.98 mg/dL (H)). Liver Function Tests: Recent Labs  Lab 11/15/17 2040 11/16/17 0551 11/17/17  0148 11/19/17 0913  AST 24 22  --   --   ALT 17 18  --   --   ALKPHOS 117 121  --   --   BILITOT 1.1 1.2  --   --   PROT 6.9 6.8  --   --   ALBUMIN 3.6 3.4* 3.2* 3.0*   No results for input(s): LIPASE, AMYLASE in the last 168 hours. No results for input(s): AMMONIA in the last 168 hours. Coagulation Profile: Recent Labs  Lab 11/17/17 1048 11/18/17 0420 11/19/17 0346 11/20/17 0319 11/21/17 0321  INR 1.79 1.55 1.70 2.13 2.11   Cardiac Enzymes: No results for input(s): CKTOTAL, CKMB, CKMBINDEX, TROPONINI in the last 168 hours. BNP (last 3 results) No results for input(s): PROBNP in the last 8760 hours. HbA1C: No results for input(s): HGBA1C in the last 72 hours. CBG: Recent Labs  Lab 11/20/17 1957 11/21/17 0808 11/21/17 0927 11/21/17 1145 11/21/17 1701  GLUCAP 106* 69* 141* 202* 211*   Lipid Profile: No results for input(s): CHOL, HDL, LDLCALC, TRIG, CHOLHDL, LDLDIRECT in the last 72 hours. Thyroid Function Tests: No results for input(s): TSH, T4TOTAL, FREET4, T3FREE, THYROIDAB in the last 72 hours. Anemia Panel: No results for  input(s): VITAMINB12, FOLATE, FERRITIN, TIBC, IRON, RETICCTPCT in the last 72 hours. Urine analysis:    Component Value Date/Time   COLORURINE AMBER (A) 12/18/2016 0625   APPEARANCEUR HAZY (A) 12/18/2016 0625   LABSPEC 1.021 12/18/2016 0625   PHURINE 6.0 12/18/2016 0625   GLUCOSEU 150 (A) 12/18/2016 0625   HGBUR NEGATIVE 12/18/2016 0625   BILIRUBINUR NEGATIVE 12/18/2016 0625   KETONESUR NEGATIVE 12/18/2016 0625   PROTEINUR 100 (A) 12/18/2016 0625   UROBILINOGEN 0.2 03/02/2014 1508   NITRITE NEGATIVE 12/18/2016 0625   LEUKOCYTESUR SMALL (A) 12/18/2016 0625   Sepsis Labs: @LABRCNTIP (procalcitonin:4,lacticidven:4)  ) Recent Results (from the past 240 hour(s))  MRSA PCR Screening     Status: None   Collection Time: 11/16/17  2:29 AM  Result Value Ref Range Status   MRSA by PCR NEGATIVE NEGATIVE Final    Comment:        The GeneXpert MRSA Assay (FDA approved for NASAL specimens only), is one component of a comprehensive MRSA colonization surveillance program. It is not intended to diagnose MRSA infection nor to guide or monitor treatment for MRSA infections. Performed at [facility], [place] [address] [place], [place] [address]       Studies: No results found.  Scheduled Meds: . amoxicillin-clavulanate  1 tablet Oral Daily  . aspirin EC  81 mg Oral Daily  . atorvastatin  80 mg Oral Daily  . Chlorhexidine Gluconate Cloth  6 each Topical Q0600  . [START ON 11/22/2017] Chlorhexidine Gluconate Cloth  6 each Topical Q0600  . feeding supplement (PRO-STAT SUGAR FREE 64)  30 mL Oral BID  . ferric citrate  210 mg Oral TID WC  . insulin aspart  0-9 Units Subcutaneous TID WC  . insulin aspart  2 Units Subcutaneous TID WC  . insulin detemir  10 Units Subcutaneous Daily  . metoprolol tartrate  25 mg Oral BID  . risperiDONE  0.5 mg Oral QHS  . rOPINIRole  0.25 mg Oral QHS  . sertraline  200 mg Oral Daily  . Warfarin - Pharmacist Dosing Inpatient   Does not apply q1800     Continuous Infusions: . sodium chloride 10 mL/hr at 11/16/17 0919     LOS: 6 days     Shayla D  Lonny Prude, MD [facility] Pager [phone]  If 7PM-7AM, please contact night-coverage [email or web address] Password [facility] 11/21/2017, 8:46 PM

## 2017-11-21 NOTE — Progress Notes (Signed)
Pt not wearing CPAP. 
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
## 2017-11-21 NOTE — Progress Notes (Signed)
ANTICOAGULATION CONSULT NOTE - Follow-Up Consult  Pharmacy Consult for Coumadin Indication: atrial fibrillation  Patient Measurements: Height: 5\' 4"  (162.6 cm) Weight: 207 lb 14.3 oz (94.3 kg) IBW/kg (Calculated) : 54.7  Vital Signs: Temp: 99 F (37.2 C) (06/28 0803) Temp Source: Oral (06/28 0803) BP: 108/52 (06/28 0803) Pulse Rate: 73 (06/28 0803)  Labs: Recent Labs    11/19/17 0346 11/19/17 0913 11/20/17 0319 11/21/17 0321  HGB  --  11.3*  --   --   HCT  --  37.1  --   --   PLT  --  132*  --   --   LABPROT 19.8*  --  23.6* 23.5*  INR 1.70  --  2.13 2.11  CREATININE  --  4.98*  --   --     Estimated Creatinine Clearance: 11.4 mL/min (A) (by C-G formula based on SCr of 4.98 mg/dL (H)).  Assessment: 72 y.o. female admitted with PNA, h/o Afib, to continue warfarin. Admit INR 1.92 on PTA dose - last dose noted to be 6/21. Pharmacy consulted to dose this admission.    PTA dose of warfarin noted to be 5mg  all days except 2.5mg  on Sundays.   INR therapeutic today at 2.11  Goal of Therapy:  INR 2-3 Monitor platelets by anticoagulation protocol: Yes   Plan:  Repeat Warfarin 5 mg po x 1  Daily INR  Thank you Anette Guarneri, PharmD [phone] 11/21/2017 9:06 AM

## 2017-11-21 NOTE — Progress Notes (Addendum)
CSW continuing to follow for transfer to [facility] when stable  Facility can accept pt over the weekend if ready but would need DC summary by 12pm on day of DC and patient in the building by 2pm  Jorge Ny, [place] Worker [phone]

## 2017-11-22 DIAGNOSIS — R443 Hallucinations, unspecified: Secondary | ICD-10-CM

## 2017-11-22 DIAGNOSIS — F329 Major depressive disorder, single episode, unspecified: Secondary | ICD-10-CM

## 2017-11-22 LAB — CBC
HCT: 38.9 % (ref 36.0–46.0)
Hemoglobin: 12 g/dL (ref 12.0–15.0)
MCH: 30.4 pg (ref 26.0–34.0)
MCHC: 30.8 g/dL (ref 30.0–36.0)
MCV: 98.5 fL (ref 78.0–100.0)
PLATELETS: 129 10*3/uL — AB (ref 150–400)
RBC: 3.95 MIL/uL (ref 3.87–5.11)
RDW: 15 % (ref 11.5–15.5)
WBC: 5.4 10*3/uL (ref 4.0–10.5)

## 2017-11-22 LAB — BASIC METABOLIC PANEL
Anion gap: 14 (ref 5–15)
BUN: 25 mg/dL — AB (ref 8–23)
CO2: 27 mmol/L (ref 22–32)
CREATININE: 5.19 mg/dL — AB (ref 0.44–1.00)
Calcium: 8.7 mg/dL — ABNORMAL LOW (ref 8.9–10.3)
Chloride: 94 mmol/L — ABNORMAL LOW (ref 98–111)
GFR, EST AFRICAN AMERICAN: 9 mL/min — AB (ref >60)
GFR, EST NON AFRICAN AMERICAN: 8 mL/min — AB (ref >60)
Glucose, Bld: 151 mg/dL — ABNORMAL HIGH (ref 70–99)
Potassium: 3.4 mmol/L — ABNORMAL LOW (ref 3.5–5.1)
SODIUM: 135 mmol/L (ref 135–145)

## 2017-11-22 LAB — PROTIME-INR
INR: 1.85
PROTHROMBIN TIME: 21.2 s — AB (ref 11.4–15.2)

## 2017-11-22 LAB — GLUCOSE, CAPILLARY
GLUCOSE-CAPILLARY: 142 mg/dL — AB (ref 70–99)
GLUCOSE-CAPILLARY: 156 mg/dL — AB (ref 70–99)
GLUCOSE-CAPILLARY: 304 mg/dL — AB (ref 70–99)
Glucose-Capillary: 268 mg/dL — ABNORMAL HIGH (ref 70–99)
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
## 2017-11-22 NOTE — Progress Notes (Signed)
Bedside RN called regarding hypotension.  Pt's mental status appears unchanged.  Pt will awaken, alert to self, and will follow commands.  Advised to monitor closely.  NP Bodenheimer notified by bedside RN.

## 2017-11-22 NOTE — Progress Notes (Signed)
Patient has been progressively hypotensive overnight but still arousable to speech  RRT consulted  Bodenheimer,NP notified

## 2017-11-22 NOTE — Progress Notes (Signed)
PROGRESS NOTE  Briana [name] ZOX:[id number] DOB: [birth year] DOA: 11/15/2017 PCP: Vidal Schwalbe, MD  HPI/Brief Narrative  Briana [name] is a 72 y.o. year old female with medical history significant for CAD with CABG 2005, right posterior occipital infarction, nocturnal hypoxia with 2 L oxygen at night, hypertension with LVH, end-stage renal disease with hemodialysis, chronic diastolic congestive heart failure/PAF with chronic Coumadin status post AICD placement  who presented on 11/15/2017 with daughter reporting new visual hallucinations and was found to have significant fluid overload and presumed PNA. On 6/29 Daughter decided to pursue hospice/comfort care. Further HD discontinued  Interval History No acute events   ROS:    Subjective Very tearful and afraid she's damaged relationship with her daughter  Assessment/Plan: Principal Problem:   Delirium due to another medical condition Active Problems:   Mixed hyperlipidemia   Pulmonary hypertension (HCC)   Physical deconditioning   Debility   Hx of CABG 2005   Paroxysmal atrial fibrillation (HCC)   Diabetes mellitus with end-stage renal disease ([place])   ESRD (end stage renal disease) on dialysis ([place])   Depression   Essential hypertension, benign   HCAP (healthcare-associated pneumonia)   Delirium   History of CVA (cerebrovascular accident)   Supplemental oxygen dependent   Chronic diastolic congestive heart failure (HCC)   ESRD on dialysis ([place])   PAF (paroxysmal atrial fibrillation) ([place])   Diabetes mellitus type 2 in obese (HCC)   Anemia of chronic disease   Thrombocytopenia (HCC)   Acute on chronic hypoxic respiratory failure, secondary to volume overload and possible PNA, stable. CXR showed pulmonary congestion on admission.  Established new dry weight with HD. No further HD given now comfort care. Continue o2 for comfort. Daughter working with s/w to discuss hospice options.   CAP. Left sided opacity on  CXR concerning for likely PNA. Has done well on augmentin after empiric zosyn treatment.    ESRD on HD. Was 4-5 kg up in weight on admission. Tolerated establishing new dry weight with HD during hospitalization. Has been adherent with HD before admission.  Goals of care discussion this morning daughter pursuing comfort care/hospice route. Will d/c further HD, nephrology aware.   PAF, rate controlled. D/c'd home lopressor  CAD s/p CABG. No chest pain.   Visual hallucinations, intermittent episodes. Visual hallucinations during my exam with daughter present. Unclear exact cause as medically infection has been ruled out with negative blood culture. Despite treatment of PNA and removal of volume with HD no improvement. Never had elevated BUN to suggest uremia. Restarted hom BZD and pain medicine with little improvement and initiation of risperdal as recommended by psych.   HLD, stable, home lipitor  T2DM, A1c 9.6 stable. Very little intake throughout hospital stay with multiple fasting glucose less than 100. D/c CBG and insulin given comfort care.  Code Status: Full Code   Family Communication: Daughter at bedside  Disposition Plan: Comfort care/ hospice, S/w consulted for assistance  Consultants:  Nephrology, Psychiatry  Procedures:  none  Antimicrobials: Anti-infectives (From admission, onward)   Start     Dose/Rate Route Frequency Ordered Stop   11/20/17 1000  amoxicillin-clavulanate (AUGMENTIN) 875-125 MG per tablet 1 tablet  Status:  Discontinued     1 tablet Oral Every 12 hours 11/20/17 0937 11/20/17 0940   11/20/17 1000  amoxicillin-clavulanate (AUGMENTIN) 500-125 MG per tablet 500 mg     1 tablet Oral Daily 11/20/17 0940 11/22/17 0939   11/18/17 1200  vancomycin (VANCOCIN) IVPB 750 mg/150 ml  premix  Status:  Discontinued     750 mg 150 mL/hr over 60 Minutes Intravenous Every T-Th-Sa (Hemodialysis) 11/16/17 0112 11/17/17 1808   11/17/17 0230  vancomycin (VANCOCIN) IVPB 750  mg/150 ml premix     750 mg 150 mL/hr over 60 Minutes Intravenous  Once 11/17/17 0226 11/17/17 0513   11/16/17 1600  vancomycin (VANCOCIN) IVPB 750 mg/150 ml premix     750 mg 150 mL/hr over 60 Minutes Intravenous Every Sun (Hemodialysis) 11/16/17 1234 11/17/17 0428   11/16/17 0130  vancomycin (VANCOCIN) 1,500 mg in sodium chloride 0.9 % 500 mL IVPB     1,500 mg 250 mL/hr over 120 Minutes Intravenous  Once 11/16/17 0112 11/16/17 0836   11/16/17 0130  piperacillin-tazobactam (ZOSYN) IVPB 3.375 g  Status:  Discontinued     3.375 g 12.5 mL/hr over 240 Minutes Intravenous Every 12 hours 11/16/17 0112 11/20/17 0937   11/15/17 2245  cefTRIAXone (ROCEPHIN) 1 g in sodium chloride 0.9 % 100 mL IVPB     1 g 200 mL/hr over 30 Minutes Intravenous  Once 11/15/17 2242 11/15/17 2350   11/15/17 2245  azithromycin (ZITHROMAX) 500 mg in sodium chloride 0.9 % 250 mL IVPB  Status:  Discontinued     500 mg 250 mL/hr over 60 Minutes Intravenous  Once 11/15/17 2242 11/16/17 0104             DVT prophylaxis:  coumadin   Objective: Vitals:   11/22/17 1030 11/22/17 1100 11/22/17 1123 11/22/17 1153  BP: (!) 149/66 (!) 169/49    Pulse: 65 60 (!) 59 (!) 59  Resp: (!) 21 15 20 15   Temp:    98.1 F (36.7 C)  TempSrc:    Axillary  SpO2: 94% (!) 86% 95% 96%  Weight:      Height:        Intake/Output Summary (Last 24 hours) at 11/22/2017 1415 Last data filed at 11/22/2017 1154 Gross per 24 hour  Intake 881.93 ml  Output 0 ml  Net 881.93 ml   Filed Weights   11/19/17 1305 11/20/17 1450 11/20/17 1930  Weight: 98 kg (216 lb 0.8 oz) 97.9 kg (215 lb 13.3 oz) 94.3 kg (207 lb 14.3 oz)    Exam:  Constitutional:normal appearing female, no distress Eyes: EOMI, anicteric, normal conjunctivae ENMT: Oropharynx with moist mucous membranes, Respiratory: Normal respiratory effort on 2.5L,  Skin: No rash ulcers, or lesions. Without skin tenting  Neurologic: Grossly no focal neuro  deficit. Psychiatric:Very tearful. Active visual hallucinations.     Data Reviewed: CBC: Recent Labs  Lab 11/15/17 2040 11/16/17 0551 11/17/17 0149 11/19/17 0913 11/22/17 0903  WBC 6.6 6.3 7.8 6.1 5.4  HGB 12.6 12.1 11.7* 11.3* 12.0  HCT 40.9 39.8 39.1 37.1 38.9  MCV 100.7* 99.7 101.8* 99.5 98.5  PLT 115* 133* 146* 132* 130*   Basic Metabolic Panel: Recent Labs  Lab 11/15/17 2040 11/16/17 0551 11/17/17 0148 11/19/17 0913 11/22/17 0903  NA 133* 138 135 134* 135  K 4.7 4.3 4.6 3.6 3.4*  CL 92* 96* 92* 94* 94*  CO2 29 32 29 25 27   GLUCOSE 446* 246* 294* 185* 151*  BUN 23* 24* 36* 31* 25*  CREATININE 3.77* 3.98* 5.16* 4.98* 5.19*  CALCIUM 8.7* 8.8* 8.6* 8.6* 8.7*  PHOS  --   --  4.7* 4.4  --    GFR: Estimated Creatinine Clearance: 10.9 mL/min (A) (by C-G formula based on SCr of 5.19 mg/dL (H)). Liver Function Tests: Recent Labs  Lab  11/15/17 2040 11/16/17 0551 11/17/17 0148 11/19/17 0913  AST 24 22  --   --   ALT 17 18  --   --   ALKPHOS 117 121  --   --   BILITOT 1.1 1.2  --   --   PROT 6.9 6.8  --   --   ALBUMIN 3.6 3.4* 3.2* 3.0*   No results for input(s): LIPASE, AMYLASE in the last 168 hours. No results for input(s): AMMONIA in the last 168 hours. Coagulation Profile: Recent Labs  Lab 11/18/17 0420 11/19/17 0346 11/20/17 0319 11/21/17 0321 11/22/17 0234  INR 1.55 1.70 2.13 2.11 1.85   Cardiac Enzymes: No results for input(s): CKTOTAL, CKMB, CKMBINDEX, TROPONINI in the last 168 hours. BNP (last 3 results) No results for input(s): PROBNP in the last 8760 hours. HbA1C: No results for input(s): HGBA1C in the last 72 hours. CBG: Recent Labs  Lab 11/21/17 1145 11/21/17 1701 11/21/17 2107 11/22/17 0836 11/22/17 1330  GLUCAP 202* 211* 132* 142* 156*   Lipid Profile: No results for input(s): CHOL, HDL, LDLCALC, TRIG, CHOLHDL, LDLDIRECT in the last 72 hours. Thyroid Function Tests: No results for input(s): TSH, T4TOTAL, FREET4, T3FREE,  THYROIDAB in the last 72 hours. Anemia Panel: No results for input(s): VITAMINB12, FOLATE, FERRITIN, TIBC, IRON, RETICCTPCT in the last 72 hours. Urine analysis:    Component Value Date/Time   COLORURINE AMBER (A) 12/18/2016 0625   APPEARANCEUR HAZY (A) 12/18/2016 0625   LABSPEC 1.021 12/18/2016 0625   PHURINE 6.0 12/18/2016 0625   GLUCOSEU 150 (A) 12/18/2016 0625   HGBUR NEGATIVE 12/18/2016 0625   BILIRUBINUR NEGATIVE 12/18/2016 0625   KETONESUR NEGATIVE 12/18/2016 0625   PROTEINUR 100 (A) 12/18/2016 0625   UROBILINOGEN 0.2 03/02/2014 1508   NITRITE NEGATIVE 12/18/2016 0625   LEUKOCYTESUR SMALL (A) 12/18/2016 0625   Sepsis Labs: @LABRCNTIP (procalcitonin:4,lacticidven:4)  ) Recent Results (from the past 240 hour(s))  MRSA PCR Screening     Status: None   Collection Time: 11/16/17  2:29 AM  Result Value Ref Range Status   MRSA by PCR NEGATIVE NEGATIVE Final    Comment:        The GeneXpert MRSA Assay (FDA approved for NASAL specimens only), is one component of a comprehensive MRSA colonization surveillance program. It is not intended to diagnose MRSA infection nor to guide or monitor treatment for MRSA infections. Performed at [facility], [place] [address], [place], [place] [address]   Culture, blood (Routine X 2) w Reflex to ID Panel     Status: None (Preliminary result)   Collection Time: 11/21/17  1:46 PM  Result Value Ref Range Status   Specimen Description BLOOD BLOOD RIGHT HAND  Final   Special Requests   Final    BOTTLES DRAWN AEROBIC AND ANAEROBIC Blood Culture adequate volume   Culture   Final    NO GROWTH < 24 HOURS Performed at [facility], [place] [address], [place], [place] [address]    Report Status PENDING  Incomplete  Culture, blood (Routine X 2) w Reflex to ID Panel     Status: None (Preliminary result)   Collection Time: 11/21/17  1:52 PM  Result Value Ref Range Status   Specimen Description BLOOD RIGHT ANTECUBITAL  Final   Special  Requests   Final    BOTTLES DRAWN AEROBIC AND ANAEROBIC Blood Culture results may not be optimal due to an inadequate volume of blood received in culture bottles   Culture   Final  NO GROWTH < 24 HOURS Performed at [facility] [address], [place], [place] [address]    Report Status PENDING  Incomplete      Studies: No results found.  Scheduled Meds: . risperiDONE  0.5 mg Oral QHS  . rOPINIRole  0.25 mg Oral QHS  . sertraline  200 mg Oral Daily    Continuous Infusions:    LOS: 7 days     Briana Hane, MD [facility] Pager [phone]  If 7PM-7AM, please contact night-coverage [email or web address] Password [email or web address] 11/22/2017, 2:15 PM

## 2017-11-22 NOTE — Progress Notes (Signed)
  [facility] Progress Note   Subjective:  Discussed QOL w/ patient this am, per her it is poor and she has been thinking about EOL her suffering and dialysis " a lot" lately.  We discussed the possibility of stopping dialysis and what that would mean.  Ultimately I recommended that dialysis might not be the best for her anymore and she agreed.  I told her that we would stop all tests/ procedures and focus on comfort care and will consult SW for hospice placement.  I spoke w/ the daughter who requests that pt stays in hospital for hospice care, will add this request to hospice consult.   Objective Vitals:   11/22/17 0530 11/22/17 0645 11/22/17 0737 11/22/17 0829  BP: (!) 105/34 (!) 113/47 (!) 91/30 (!) 93/30  Pulse: (!) 59 64 66 69  Resp: (!) 23 20 (!) 21 (!) 25  Temp:    98.8 F (37.1 C)  TempSrc:    Oral  SpO2: 93% 94% 96% 93%  Weight:      Height:       Physical Exam General: Well developed, NAD. On nasal oxygen Heart: RRR; no murmur Lungs: clear bilat to the bases Extremities: trace edema LE's Dialysis Access: L AVF + thrill  Additional Objective Labs: Basic Metabolic Panel: Recent Labs  Lab 11/16/17 0551 11/17/17 0148 11/19/17 0913  NA 138 135 134*  K 4.3 4.6 3.6  CL 96* 92* 94*  CO2 32 29 25  GLUCOSE 246* 294* 185*  BUN 24* 36* 31*  CREATININE 3.98* 5.16* 4.98*  CALCIUM 8.8* 8.6* 8.6*  PHOS  --  4.7* 4.4   Liver Function Tests: Recent Labs  Lab 11/15/17 2040 11/16/17 0551 11/17/17 0148 11/19/17 0913  AST 24 22  --   --   ALT 17 18  --   --   ALKPHOS 117 121  --   --   BILITOT 1.1 1.2  --   --   PROT 6.9 6.8  --   --   ALBUMIN 3.6 3.4* 3.2* 3.0*   CBC: Recent Labs  Lab 11/15/17 2040 11/16/17 0551 11/17/17 0149 11/19/17 0913  WBC 6.6 6.3 7.8 6.1  HGB 12.6 12.1 11.7* 11.3*  HCT 40.9 39.8 39.1 37.1  MCV 100.7* 99.7 101.8* 99.5  PLT 115* 133* 146* 132*   Studies/Results: No results found. Medications: . sodium chloride 10 mL/hr  at 11/16/17 0919     Dialysis: [facility] at [facility] 4h  400/A1.5   100.5kg   2/2.5 bath  AVF   Heparin none  Assessment/Plan: 1. Dyspnea/pulm edema: resolved 2. Hallucinations: a little better 3. ESRD: no further dialysis, transitioning to hospice/ comfort care. SW consult for hospice.    Kelly Splinter MD [employer] pager [phone]   11/22/2017, 10:23 AM

## 2017-11-22 NOTE — Progress Notes (Signed)
Text page sent to S.Lonny Prude, MD - [phone] V.[place]; BP 91/30(48). pt alert to self/situation. Scheduled for HD today, do we need midodrine instead of bolus?  0810: text page sent to R.[place], [place] - [phone] V.[place]; BP 91/30(48). pt alert x2. Scheduled 4 HD 6/29. Many low BP overnight. Primary MD made aware. Holding BP meds.  0816: callback received from Jonnie Finner, MD - he wishes for patient to go to HD on 2nd round today so that he can [place] conversation with her.

## 2017-11-22 NOTE — Progress Notes (Signed)
CSW as requested by Pt and MD seeking hospice placement. Pt daughter request's [facility] [phone]). CSW faxed referral and was contacted by agency. Pt currently "third" on waiting list. Hospice will call CSW when bed becomes available. Pt and daughter notified of plan. Will continue to assist as needed.

## 2017-11-23 LAB — GLUCOSE, CAPILLARY: GLUCOSE-CAPILLARY: 174 mg/dL — AB (ref 70–99)

## 2017-11-23 MED ORDER — DIPHENHYDRAMINE HCL 50 MG/ML IJ SOLN: 12.5000 mg | INTRAMUSCULAR | Status: DC | PRN

## 2017-11-23 MED ORDER — MORPHINE SULFATE (CONCENTRATE) 10 MG/0.5ML PO SOLN
5.0000 mg | ORAL | Status: DC | PRN
  Administered 2017-11-25 – 2017-11-26 (×2): 5 mg via ORAL
  Filled 2017-11-23 (×2): qty 0.5

## 2017-11-23 MED ORDER — ACETAMINOPHEN 325 MG PO TABS
650.0000 mg | ORAL_TABLET | Freq: Four times a day (QID) | ORAL | Status: DC | PRN
  Filled 2017-11-23: qty 2

## 2017-11-23 MED ORDER — ACETAMINOPHEN 650 MG RE SUPP: 650.0000 mg | Freq: Four times a day (QID) | RECTAL | Status: DC | PRN

## 2017-11-23 MED ORDER — LORAZEPAM 2 MG/ML IJ SOLN: 1.0000 mg | INTRAMUSCULAR | Status: DC | PRN

## 2017-11-23 MED ORDER — ACETAMINOPHEN 325 MG PO TABS
650.0000 mg | ORAL_TABLET | Freq: Four times a day (QID) | ORAL | Status: DC | PRN
  Administered 2017-11-23: 650 mg via ORAL

## 2017-11-23 MED ORDER — ONDANSETRON 4 MG PO TBDP: 4.0000 mg | ORAL_TABLET | Freq: Four times a day (QID) | ORAL | Status: DC | PRN

## 2017-11-23 MED ORDER — LORAZEPAM 1 MG PO TABS
1.0000 mg | ORAL_TABLET | ORAL | Status: DC | PRN
Start: 2017-11-23 — End: 2017-12-02
  Administered 2017-11-24: 1 mg via ORAL
  Filled 2017-11-23: qty 1

## 2017-11-23 MED ORDER — MORPHINE SULFATE (CONCENTRATE) 10 MG/0.5ML PO SOLN
5.0000 mg | ORAL | Status: DC | PRN
  Administered 2017-11-30 – 2017-12-02 (×4): 5 mg via SUBLINGUAL
  Filled 2017-11-23 (×4): qty 0.5

## 2017-11-23 MED ORDER — ONDANSETRON HCL 4 MG/2ML IJ SOLN: 4.0000 mg | Freq: Four times a day (QID) | INTRAMUSCULAR | Status: DC | PRN

## 2017-11-23 MED ORDER — LORAZEPAM 2 MG/ML PO CONC: 1.0000 mg | ORAL | Status: DC | PRN

## 2017-11-23 NOTE — Progress Notes (Signed)
PROGRESS NOTE  Briana [name]:[id number] DOB: 12-20-1945 DOA: 11/15/2017 PCP: Vidal Schwalbe, MD  HPI/Brief Narrative  Briana [name] is a 72 y.o. year old female with medical history significant for CAD with CABG 2005, right posterior occipital infarction, nocturnal hypoxia with 2 L oxygen at night, hypertension with LVH, end-stage renal disease with hemodialysis, chronic diastolic congestive heart failure/PAF with chronic Coumadin status post AICD placement  who presented on 11/15/2017 with daughter reporting new visual hallucinations and was found to have significant fluid overload and presumed PNA. On 6/29 Daughter decided to pursue hospice/comfort care. Further HD discontinued  Interval History No acute events   ROS:    Subjective Pleasant in conversation. No complaints  Assessment/Plan: Principal Problem:   Delirium due to another medical condition Active Problems:   Mixed hyperlipidemia   Pulmonary hypertension (HCC)   Physical deconditioning   Debility   Hx of CABG 2005   Paroxysmal atrial fibrillation (HCC)   Diabetes mellitus with end-stage renal disease ([place])   ESRD (end stage renal disease) on dialysis ([place])   Depression   Essential hypertension, benign   HCAP (healthcare-associated pneumonia)   Delirium   History of CVA (cerebrovascular accident)   Supplemental oxygen dependent   Chronic diastolic congestive heart failure (HCC)   ESRD on dialysis (HCC)   PAF (paroxysmal atrial fibrillation) (HCC)   Diabetes mellitus type 2 in obese (HCC)   Anemia of chronic disease   Thrombocytopenia (HCC)   Acute on chronic hypoxic respiratory failure, secondary to volume overload and possible PNA, stable. CXR showed pulmonary congestion on admission.  Established new dry weight with HD. No further HD given now comfort care. Continue o2 for comfort. Daughter working with s/w to discuss hospice options.   CAP. Left sided opacity on CXR concerning for likely PNA.  Has done well on augmentin after empiric zosyn treatment.    ESRD on HD. Was 4-5 kg up in weight on admission. Tolerated establishing new dry weight with HD during hospitalization. Has been adherent with HD before admission.  Goals of care discussion this morning daughter pursuing comfort care/hospice route. Will d/c further HD, nephrology aware.   PAF, rate controlled. D/c'd home lopressor  CAD s/p CABG. No chest pain.   Visual hallucinations, intermittent episodes. Unclear exact cause as medically infection has been ruled out with negative blood culture. Despite treatment of PNA and removal of volume with HD no improvement. Never had elevated BUN to suggest uremia. Restarted hom BZD and pain medicine with little improvement and initiation of risperdal as recommended by psych.   HLD, stable, home lipitor  T2DM, A1c 9.6 stable. Very little intake throughout hospital stay with multiple fasting glucose less than 100. D/c CBG and insulin given comfort care.  Code Status: DNR, confirmed with daughter today   Family Communication: Daughter at bedside  Disposition Plan: Comfort care/ hospice, S/w consulted for assistance  Consultants:  Nephrology, Psychiatry  Procedures:  none  Antimicrobials: Anti-infectives (From admission, onward)   Start     Dose/Rate Route Frequency Ordered Stop   11/20/17 1000  amoxicillin-clavulanate (AUGMENTIN) 875-125 MG per tablet 1 tablet  Status:  Discontinued     1 tablet Oral Every 12 hours 11/20/17 0937 11/20/17 0940   11/20/17 1000  amoxicillin-clavulanate (AUGMENTIN) 500-125 MG per tablet 500 mg     1 tablet Oral Daily 11/20/17 0940 11/22/17 0939   11/18/17 1200  vancomycin (VANCOCIN) IVPB 750 mg/150 ml premix  Status:  Discontinued     750  mg 150 mL/hr over 60 Minutes Intravenous Every T-Th-Sa (Hemodialysis) 11/16/17 0112 11/17/17 1808   11/17/17 0230  vancomycin (VANCOCIN) IVPB 750 mg/150 ml premix     750 mg 150 mL/hr over 60 Minutes Intravenous   Once 11/17/17 0226 11/17/17 0513   11/16/17 1600  vancomycin (VANCOCIN) IVPB 750 mg/150 ml premix     750 mg 150 mL/hr over 60 Minutes Intravenous Every Sun (Hemodialysis) 11/16/17 1234 11/17/17 0428   11/16/17 0130  vancomycin (VANCOCIN) 1,500 mg in sodium chloride 0.9 % 500 mL IVPB     1,500 mg 250 mL/hr over 120 Minutes Intravenous  Once 11/16/17 0112 11/16/17 0836   11/16/17 0130  piperacillin-tazobactam (ZOSYN) IVPB 3.375 g  Status:  Discontinued     3.375 g 12.5 mL/hr over 240 Minutes Intravenous Every 12 hours 11/16/17 0112 11/20/17 0937   11/15/17 2245  cefTRIAXone (ROCEPHIN) 1 g in sodium chloride 0.9 % 100 mL IVPB     1 g 200 mL/hr over 30 Minutes Intravenous  Once 11/15/17 2242 11/15/17 2350   11/15/17 2245  azithromycin (ZITHROMAX) 500 mg in sodium chloride 0.9 % 250 mL IVPB  Status:  Discontinued     500 mg 250 mL/hr over 60 Minutes Intravenous  Once 11/15/17 2242 11/16/17 0104             DVT prophylaxis:  coumadin   Objective: Vitals:   11/22/17 1153 11/22/17 2016 11/22/17 2338 11/23/17 0827  BP:  (!) 118/39 (!) 135/43 (!) 141/51  Pulse: (!) 59 61 61 69  Resp: 15  20 17   Temp: 98.1 F (36.7 C) 99.3 F (37.4 C) 99 F (37.2 C) 98.3 F (36.8 C)  TempSrc: Axillary  Oral Oral  SpO2: 96% 98% 95% 97%  Weight:      Height:       No intake or output data in the 24 hours ending 11/23/17 1227 Filed Weights   11/19/17 1305 11/20/17 1450 11/20/17 1930  Weight: 98 kg (216 lb 0.8 oz) 97.9 kg (215 lb 13.3 oz) 94.3 kg (207 lb 14.3 oz)    Exam:  Constitutional:normal appearing female, no distress Eyes: EOMI, anicteric, normal conjunctivae ENMT: Oropharynx with moist mucous membranes, Respiratory: Normal respiratory effort on 2.5L,  Skin: No rash ulcers, or lesions. Without skin tenting  Neurologic: Grossly no focal neuro deficit. Psychiatric:normal mood, normal affect, no hallucination during today's exam     Data Reviewed: CBC: Recent Labs  Lab  11/17/17 0149 11/19/17 0913 11/22/17 0903  WBC 7.8 6.1 5.4  HGB 11.7* 11.3* 12.0  HCT 39.1 37.1 38.9  MCV 101.8* 99.5 98.5  PLT 146* 132* 916*   Basic Metabolic Panel: Recent Labs  Lab 11/17/17 0148 11/19/17 0913 11/22/17 0903  NA 135 134* 135  K 4.6 3.6 3.4*  CL 92* 94* 94*  CO2 29 25 27   GLUCOSE 294* 185* 151*  BUN 36* 31* 25*  CREATININE 5.16* 4.98* 5.19*  CALCIUM 8.6* 8.6* 8.7*  PHOS 4.7* 4.4  --    GFR: Estimated Creatinine Clearance: 10.9 mL/min (A) (by C-G formula based on SCr of 5.19 mg/dL (H)). Liver Function Tests: Recent Labs  Lab 11/17/17 0148 11/19/17 0913  ALBUMIN 3.2* 3.0*   No results for input(s): LIPASE, AMYLASE in the last 168 hours. No results for input(s): AMMONIA in the last 168 hours. Coagulation Profile: Recent Labs  Lab 11/18/17 0420 11/19/17 0346 11/20/17 0319 11/21/17 0321 11/22/17 0234  INR 1.55 1.70 2.13 2.11 1.85   Cardiac Enzymes: No results  for input(s): CKTOTAL, CKMB, CKMBINDEX, TROPONINI in the last 168 hours. BNP (last 3 results) No results for input(s): PROBNP in the last 8760 hours. HbA1C: No results for input(s): HGBA1C in the last 72 hours. CBG: Recent Labs  Lab 11/22/17 0836 11/22/17 1330 11/22/17 1729 11/22/17 2109 11/23/17 0828  GLUCAP 142* 156* 304* 268* 174*   Lipid Profile: No results for input(s): CHOL, HDL, LDLCALC, TRIG, CHOLHDL, LDLDIRECT in the last 72 hours. Thyroid Function Tests: No results for input(s): TSH, T4TOTAL, FREET4, T3FREE, THYROIDAB in the last 72 hours. Anemia Panel: No results for input(s): VITAMINB12, FOLATE, FERRITIN, TIBC, IRON, RETICCTPCT in the last 72 hours. Urine analysis:    Component Value Date/Time   COLORURINE AMBER (A) 12/18/2016 0625   APPEARANCEUR HAZY (A) 12/18/2016 0625   LABSPEC 1.021 12/18/2016 0625   PHURINE 6.0 12/18/2016 0625   GLUCOSEU 150 (A) 12/18/2016 0625   HGBUR NEGATIVE 12/18/2016 0625   BILIRUBINUR NEGATIVE 12/18/2016 0625   KETONESUR NEGATIVE  12/18/2016 0625   PROTEINUR 100 (A) 12/18/2016 0625   UROBILINOGEN 0.2 03/02/2014 1508   NITRITE NEGATIVE 12/18/2016 0625   LEUKOCYTESUR SMALL (A) 12/18/2016 0625   Sepsis Labs: @LABRCNTIP (procalcitonin:4,lacticidven:4)  ) Recent Results (from the past 240 hour(s))  MRSA PCR Screening     Status: None   Collection Time: 11/16/17  2:29 AM  Result Value Ref Range Status   MRSA by PCR NEGATIVE NEGATIVE Final    Comment:        The GeneXpert MRSA Assay (FDA approved for NASAL specimens only), is one component of a comprehensive MRSA colonization surveillance program. It is not intended to diagnose MRSA infection nor to guide or monitor treatment for MRSA infections. Performed at [facility], [place] [address], [place], [place] [address]   Culture, blood (Routine X 2) w Reflex to ID Panel     Status: None (Preliminary result)   Collection Time: 11/21/17  1:46 PM  Result Value Ref Range Status   Specimen Description BLOOD BLOOD RIGHT HAND  Final   Special Requests   Final    BOTTLES DRAWN AEROBIC AND ANAEROBIC Blood Culture adequate volume   Culture   Final    NO GROWTH < 24 HOURS Performed at [facility], [place] [address], [place], [place] [address]    Report Status PENDING  Incomplete  Culture, blood (Routine X 2) w Reflex to ID Panel     Status: None (Preliminary result)   Collection Time: 11/21/17  1:52 PM  Result Value Ref Range Status   Specimen Description BLOOD RIGHT ANTECUBITAL  Final   Special Requests   Final    BOTTLES DRAWN AEROBIC AND ANAEROBIC Blood Culture results may not be optimal due to an inadequate volume of blood received in culture bottles   Culture   Final    NO GROWTH < 24 HOURS Performed at [facility], [place] [address], [place], [place] [address]    Report Status PENDING  Incomplete      Studies: No results found.  Scheduled Meds: . risperiDONE  0.5 mg Oral QHS  . rOPINIRole  0.25 mg Oral QHS  . sertraline  200 mg Oral Daily     Continuous Infusions:    LOS: 8 days     Desiree Hane, MD [facility] Pager [phone]  If 7PM-7AM, please contact night-coverage [email or web address] Password [facility] 11/23/2017, 12:27 PM

## 2017-11-24 NOTE — Progress Notes (Signed)
CSW following for transition to residential hospice when bed available- spoke with [place] at [facility] who requested updated clinicals- requested clinicals sent  [facility] reviewing their beds this morning and will inform CSW if pt will have bed available  Jorge Ny, [place] Social Worker [phone]

## 2017-11-24 NOTE — Progress Notes (Signed)
Delightful patient and daughter.  Provided spiritual support and prayer.  Before I could say Amen, the patient's daughter was praying for me.  Sweet family and very appreciative of all the staff and care givers.  Conard Novak, Chaplain   11/24/17 1500  Clinical Encounter Type  Visited With Patient and family together  Visit Type Initial;Spiritual support  Referral From Physician  Consult/Referral To Chaplain  Spiritual Encounters  Spiritual Needs Prayer;Emotional  Stress Factors  Patient Stress Factors Not reviewed  Family Stress Factors Not reviewed

## 2017-11-25 DIAGNOSIS — J181 Lobar pneumonia, unspecified organism: Secondary | ICD-10-CM | POA: Diagnosis present

## 2017-11-25 DIAGNOSIS — R441 Visual hallucinations: Secondary | ICD-10-CM | POA: Diagnosis present

## 2017-11-25 NOTE — Progress Notes (Signed)
PROGRESS NOTE  Briana [name] AJG:[id number] DOB: [birth year] DOA: 11/15/2017 PCP: Vidal Schwalbe, MD  HPI/Brief Narrative  Briana [name] is a 72 y.o. year old female with medical history significant for CAD with CABG 2005, right posterior occipital infarction, nocturnal hypoxia with 2 L oxygen at night, hypertension with LVH, end-stage renal disease with hemodialysis, chronic diastolic congestive heart failure/PAF with chronic Coumadin status post AICD placement  who presented on 11/15/2017 with daughter reporting new visual hallucinations and was found to have significant fluid overload and presumed PNA. On 6/29 Daughter decided to pursue hospice/comfort care. Further HD discontinued  Hospital course In the ED, patient was found to be afebrile with a respiratory rate of 29, SPO2 82% requiring 2 L of oxygen, heart rate 59-65, blood pressure 165/58.  Lab work was significant for procalcitonin 0.20, lactic acid 2.18Glucose 343, WBC 6.6, BUN 23, creatinine 3.77.    CT head showed no acute intracranial abnormalities.  Did demonstrate chronic atrophy and small vessel ischemic changes.  Chest x-ray showed progressive cardiomegaly with progressive identities of CHF.  Interval left basilar atelectasis and possible pneumonia  She was started empirically on IV vancomycin.  Given no marginal blood cultures patient was transitioned to IV Zosyn given concern for possible aspiration pneumonia.  She tolerated transition to oral Augmentin to complete a full course of therapy for CAP.  Unfortunately despite infectious work-up/treatment, consecutive HD sessions, optimization of psychiatric medications patient continued to persistently have visual hallucinations with intermittent confusion.  On 6/29 after discussion with nephrologist daughter decided to pursue hospice/comfort care and discontinued further HD sessions.     ROS:    Subjective Pleasant in conversation. No  complaints  Assessment/Plan: Principal Problem:   Delirium due to another medical condition Active Problems:   Mixed hyperlipidemia   Pulmonary hypertension (HCC)   Physical deconditioning   Debility   Hx of CABG 2005   Paroxysmal atrial fibrillation (HCC)   Diabetes mellitus with end-stage renal disease ([place])   ESRD (end stage renal disease) on dialysis ([place])   Depression   Essential hypertension, benign   HCAP (healthcare-associated pneumonia)   Delirium   History of CVA (cerebrovascular accident)   Supplemental oxygen dependent   Chronic diastolic congestive heart failure (HCC)   ESRD on dialysis (HCC)   PAF (paroxysmal atrial fibrillation) (HCC)   Diabetes mellitus type 2 in obese (HCC)   Anemia of chronic disease   Thrombocytopenia (HCC)   Acute on chronic hypoxic respiratory failure, secondary to volume overload and possible PNA, stable. CXR showed pulmonary congestion on admission.  Established new dry weight with HD. No further HD given now comfort care. Continue o2 for comfort. Daughter working with s/w to discuss hospice options.   CAP. Left sided opacity on CXR concerning for likely PNA.  Completed full course of antibiotics  ESRD on HD.  Hypervolemic on admission, tolerated consecutive HD sessions to establish new dry weight.  6/29 for the HD sessions discontinued as daughter pursued hospice/comfort care.   PAF, rate controlled.  No longer on home Lopressor given comfort care measures  CAD s/p CABG. No chest pain.   Visual hallucinations, intermittent episodes. Unclear exact cause as medically infection has been ruled out with negative blood culture. Despite treatment of PNA and removal of volume with HD no improvement. Never had elevated BUN to suggest uremia. Restarted hom BZD and pain medicine direction from psychiatry consult with little improvement and initiation of risperdal.   HLD, stable,   T2DM, A1c 9.6  stable. Very little intake throughout hospital  stay with multiple fasting glucose less than 100. D/c CBG and insulin given comfort care.  Code Status: DNR, confirmed with daughter  Family Communication: Daughter at bedside  Disposition Plan: Comfort care/ hospice, S/w consulted for assistance, awaiting bed availability  Consultants:  Nephrology, Psychiatry  Procedures:  none  Antimicrobials: Anti-infectives (From admission, onward)   Start     Dose/Rate Route Frequency Ordered Stop   11/20/17 1000  amoxicillin-clavulanate (AUGMENTIN) 875-125 MG per tablet 1 tablet  Status:  Discontinued     1 tablet Oral Every 12 hours 11/20/17 0937 11/20/17 0940   11/20/17 1000  amoxicillin-clavulanate (AUGMENTIN) 500-125 MG per tablet 500 mg     1 tablet Oral Daily 11/20/17 0940 11/22/17 0939   11/18/17 1200  vancomycin (VANCOCIN) IVPB 750 mg/150 ml premix  Status:  Discontinued     750 mg 150 mL/hr over 60 Minutes Intravenous Every T-Th-Sa (Hemodialysis) 11/16/17 0112 11/17/17 1808   11/17/17 0230  vancomycin (VANCOCIN) IVPB 750 mg/150 ml premix     750 mg 150 mL/hr over 60 Minutes Intravenous  Once 11/17/17 0226 11/17/17 0513   11/16/17 1600  vancomycin (VANCOCIN) IVPB 750 mg/150 ml premix     750 mg 150 mL/hr over 60 Minutes Intravenous Every Sun (Hemodialysis) 11/16/17 1234 11/17/17 0428   11/16/17 0130  vancomycin (VANCOCIN) 1,500 mg in sodium chloride 0.9 % 500 mL IVPB     1,500 mg 250 mL/hr over 120 Minutes Intravenous  Once 11/16/17 0112 11/16/17 0836   11/16/17 0130  piperacillin-tazobactam (ZOSYN) IVPB 3.375 g  Status:  Discontinued     3.375 g 12.5 mL/hr over 240 Minutes Intravenous Every 12 hours 11/16/17 0112 11/20/17 0937   11/15/17 2245  cefTRIAXone (ROCEPHIN) 1 g in sodium chloride 0.9 % 100 mL IVPB     1 g 200 mL/hr over 30 Minutes Intravenous  Once 11/15/17 2242 11/15/17 2350   11/15/17 2245  azithromycin (ZITHROMAX) 500 mg in sodium chloride 0.9 % 250 mL IVPB  Status:  Discontinued     500 mg 250 mL/hr over 60  Minutes Intravenous  Once 11/15/17 2242 11/16/17 0104             DVT prophylaxis: None   Objective: Vitals:   11/22/17 2016 11/22/17 2338 11/23/17 0827 11/25/17 1131  BP: (!) 118/39 (!) 135/43 (!) 141/51 (!) 136/37  Pulse: 61 61 69 61  Resp:  20 17 18   Temp: 99.3 F (37.4 C) 99 F (37.2 C) 98.3 F (36.8 C) 98 F (36.7 C)  TempSrc:  Oral Oral Oral  SpO2: 98% 95% 97% 96%  Weight:      Height:        Intake/Output Summary (Last 24 hours) at 11/25/2017 1641 Last data filed at 11/25/2017 1230 Gross per 24 hour  Intake 240 ml  Output -  Net 240 ml   Filed Weights   11/19/17 1305 11/20/17 1450 11/20/17 1930  Weight: 98 kg (216 lb 0.8 oz) 97.9 kg (215 lb 13.3 oz) 94.3 kg (207 lb 14.3 oz)    Exam:  Constitutional:normal appearing female, no distress Eyes: EOMI, anicteric, normal conjunctivae ENMT: Oropharynx with moist mucous membranes, Respiratory: Normal respiratory effort on 2.5L,  Skin: No rash ulcers, or lesions. Without skin tenting  Neurologic: Grossly no focal neuro deficit. Psychiatric:normal mood, normal affect, active visual hallucinations, alert to self only  Data Reviewed: CBC: Recent Labs  Lab 11/19/17 0913 11/22/17 0903  WBC 6.1 5.4  HGB  11.3* 12.0  HCT 37.1 38.9  MCV 99.5 98.5  PLT 132* 782*   Basic Metabolic Panel: Recent Labs  Lab 11/19/17 0913 11/22/17 0903  NA 134* 135  K 3.6 3.4*  CL 94* 94*  CO2 25 27  GLUCOSE 185* 151*  BUN 31* 25*  CREATININE 4.98* 5.19*  CALCIUM 8.6* 8.7*  PHOS 4.4  --    GFR: Estimated Creatinine Clearance: 10.9 mL/min (A) (by C-G formula based on SCr of 5.19 mg/dL (H)). Liver Function Tests: Recent Labs  Lab 11/19/17 0913  ALBUMIN 3.0*   No results for input(s): LIPASE, AMYLASE in the last 168 hours. No results for input(s): AMMONIA in the last 168 hours. Coagulation Profile: Recent Labs  Lab 11/19/17 0346 11/20/17 0319 11/21/17 0321 11/22/17 0234  INR 1.70 2.13 2.11 1.85   Cardiac  Enzymes: No results for input(s): CKTOTAL, CKMB, CKMBINDEX, TROPONINI in the last 168 hours. BNP (last 3 results) No results for input(s): PROBNP in the last 8760 hours. HbA1C: No results for input(s): HGBA1C in the last 72 hours. CBG: Recent Labs  Lab 11/22/17 0836 11/22/17 1330 11/22/17 1729 11/22/17 2109 11/23/17 0828  GLUCAP 142* 156* 304* 268* 174*   Lipid Profile: No results for input(s): CHOL, HDL, LDLCALC, TRIG, CHOLHDL, LDLDIRECT in the last 72 hours. Thyroid Function Tests: No results for input(s): TSH, T4TOTAL, FREET4, T3FREE, THYROIDAB in the last 72 hours. Anemia Panel: No results for input(s): VITAMINB12, FOLATE, FERRITIN, TIBC, IRON, RETICCTPCT in the last 72 hours. Urine analysis:    Component Value Date/Time   COLORURINE AMBER (A) 12/18/2016 0625   APPEARANCEUR HAZY (A) 12/18/2016 0625   LABSPEC 1.021 12/18/2016 0625   PHURINE 6.0 12/18/2016 0625   GLUCOSEU 150 (A) 12/18/2016 0625   HGBUR NEGATIVE 12/18/2016 0625   BILIRUBINUR NEGATIVE 12/18/2016 0625   KETONESUR NEGATIVE 12/18/2016 0625   PROTEINUR 100 (A) 12/18/2016 0625   UROBILINOGEN 0.2 03/02/2014 1508   NITRITE NEGATIVE 12/18/2016 0625   LEUKOCYTESUR SMALL (A) 12/18/2016 0625   Sepsis Labs: @LABRCNTIP (procalcitonin:4,lacticidven:4)  ) Recent Results (from the past 240 hour(s))  MRSA PCR Screening     Status: None   Collection Time: 11/16/17  2:29 AM  Result Value Ref Range Status   MRSA by PCR NEGATIVE NEGATIVE Final    Comment:        The GeneXpert MRSA Assay (FDA approved for NASAL specimens only), is one component of a comprehensive MRSA colonization surveillance program. It is not intended to diagnose MRSA infection nor to guide or monitor treatment for MRSA infections. Performed at [facility], [place] [address], [place], [place] [address]   Culture, blood (Routine X 2) w Reflex to ID Panel     Status: None (Preliminary result)   Collection Time: 11/21/17  1:46 PM  Result  Value Ref Range Status   Specimen Description BLOOD BLOOD RIGHT HAND  Final   Special Requests   Final    BOTTLES DRAWN AEROBIC AND ANAEROBIC Blood Culture adequate volume   Culture   Final    NO GROWTH 4 DAYS Performed at [facility], [place] [address], [place], [place] [address]    Report Status PENDING  Incomplete  Culture, blood (Routine X 2) w Reflex to ID Panel     Status: None (Preliminary result)   Collection Time: 11/21/17  1:52 PM  Result Value Ref Range Status   Specimen Description BLOOD RIGHT ANTECUBITAL  Final   Special Requests   Final    BOTTLES  DRAWN AEROBIC AND ANAEROBIC Blood Culture results may not be optimal due to an inadequate volume of blood received in culture bottles   Culture   Final    NO GROWTH 4 DAYS Performed at [facility] [address] [place], [place] [address]    Report Status PENDING  Incomplete      Studies: No results found.  Scheduled Meds: . risperiDONE  0.5 mg Oral QHS  . rOPINIRole  0.25 mg Oral QHS  . sertraline  200 mg Oral Daily    Continuous Infusions:    LOS: 10 days     Desiree Hane, MD [facility] Pager [phone]  If 7PM-7AM, please contact night-coverage [email or web address] Password [facility] 11/25/2017, 4:41 PM

## 2017-11-26 ENCOUNTER — Encounter: Payer: Medicare Other | Admitting: *Deleted

## 2017-11-26 ENCOUNTER — Telehealth: Payer: Self-pay | Admitting: Cardiology

## 2017-11-26 LAB — CULTURE, BLOOD (ROUTINE X 2)
CULTURE: NO GROWTH
CULTURE: NO GROWTH
Special Requests: ADEQUATE

## 2017-11-26 NOTE — Telephone Encounter (Signed)
Confirmed remote transmission w/ pt daughter. Pt daughter informed me that pt is waiting to be placed in hospice.

## 2017-11-26 NOTE — Care Management Important Message (Signed)
Important Message  Patient Details  Name: Briana [name] MRN: [id number] Date of Birth: Dec 10, 1945   Medicare Important Message Given:  Yes    Orbie Pyo 11/26/2017, 2:47 PM

## 2017-11-26 NOTE — Progress Notes (Signed)
PROGRESS NOTE  Briana [name] SWN:[id number] DOB: February 23, 1946 DOA: 11/15/2017 PCP: Vidal Schwalbe, MD  HPI/Brief Narrative  Briana [name] is a 72 y.o. year old female with medical history significant for CAD with CABG 2005, right posterior occipital infarction, nocturnal hypoxia with 2 L oxygen at night, hypertension with LVH, end-stage renal disease with hemodialysis, chronic diastolic congestive heart failure/PAF with chronic Coumadin status post AICD placement  who presented on 11/15/2017 with daughter reporting new visual hallucinations and was found to have significant fluid overload and presumed PNA. On 6/29 Daughter decided to pursue hospice/comfort care. Further HD discontinued  Hospital course In the ED, patient was found to be afebrile with a respiratory rate of 29, SPO2 82% requiring 2 L of oxygen, heart rate 59-65, blood pressure 165/58.  Lab work was significant for procalcitonin 0.20, lactic acid 2.18Glucose 343, WBC 6.6, BUN 23, creatinine 3.77.    CT head showed no acute intracranial abnormalities.  Did demonstrate chronic atrophy and small vessel ischemic changes.  Chest x-ray showed progressive cardiomegaly with progressive identities of CHF.  Interval left basilar atelectasis and possible pneumonia  She was started empirically on IV vancomycin.  Given no marginal blood cultures patient was transitioned to IV Zosyn given concern for possible aspiration pneumonia.  She tolerated transition to oral Augmentin to complete a full course of therapy for CAP.  Unfortunately despite infectious work-up/treatment, consecutive HD sessions, optimization of psychiatric medications patient continued to persistently have visual hallucinations with intermittent confusion.  On 6/29 after discussion with nephrologist daughter decided to pursue hospice/comfort care and discontinued further HD sessions.     ROS:    Subjective Pleasant in conversation. No  complaints  Assessment/Plan: Principal Problem:   Delirium due to another medical condition Active Problems:   Mixed hyperlipidemia   Pulmonary hypertension (HCC)   Physical deconditioning   Debility   Hx of CABG 2005   Paroxysmal atrial fibrillation (HCC)   Diabetes mellitus with end-stage renal disease ([place])   ESRD (end stage renal disease) on dialysis ([place])   Depression   Essential hypertension, benign   HCAP (healthcare-associated pneumonia)   Delirium   History of CVA (cerebrovascular accident)   Supplemental oxygen dependent   Chronic diastolic congestive heart failure (HCC)   ESRD on dialysis (HCC)   PAF (paroxysmal atrial fibrillation) (HCC)   Diabetes mellitus type 2 in obese (HCC)   Anemia of chronic disease   Thrombocytopenia (HCC)   Visual hallucinations   Lobar pneumonia (HCC)   Acute on chronic hypoxic respiratory failure, secondary to volume overload and possible PNA, stable. CXR showed pulmonary congestion on admission.  Established new dry weight with HD. No further HD given now comfort care. Continue o2 for comfort. Daughter working with s/w to discuss hospice options.   CAP. Left sided opacity on CXR concerning for likely PNA.  Completed full course of antibiotics  ESRD on HD.  Hypervolemic on admission, tolerated consecutive HD sessions to establish new dry weight.  6/29 for the HD sessions discontinued as daughter pursued hospice/comfort care.   PAF, rate controlled.  No longer on home Lopressor given comfort care measures  CAD s/p CABG. No chest pain.   Visual hallucinations, intermittent episodes. Unclear exact cause as medically infection has been ruled out with negative blood culture. Despite treatment of PNA and removal of volume with HD no improvement. Never had elevated BUN to suggest uremia. Restarted hom BZD and pain medicine direction from psychiatry consult with little improvement and initiation of risperdal.  HLD, stable,   T2DM, A1c 9.6  stable. Very little intake throughout hospital stay with multiple fasting glucose less than 100. D/c CBG and insulin given comfort care.  Code Status: DNR, confirmed with daughter  Family Communication: None at bedside  Disposition Plan: Comfort care/ hospice, S/w consulted for assistance, awaiting bed availability  Consultants:  Nephrology, Psychiatry  Procedures:  none  Antimicrobials: Anti-infectives (From admission, onward)   Start     Dose/Rate Route Frequency Ordered Stop   11/20/17 1000  amoxicillin-clavulanate (AUGMENTIN) 875-125 MG per tablet 1 tablet  Status:  Discontinued     1 tablet Oral Every 12 hours 11/20/17 0937 11/20/17 0940   11/20/17 1000  amoxicillin-clavulanate (AUGMENTIN) 500-125 MG per tablet 500 mg     1 tablet Oral Daily 11/20/17 0940 11/22/17 0939   11/18/17 1200  vancomycin (VANCOCIN) IVPB 750 mg/150 ml premix  Status:  Discontinued     750 mg 150 mL/hr over 60 Minutes Intravenous Every T-Th-Sa (Hemodialysis) 11/16/17 0112 11/17/17 1808   11/17/17 0230  vancomycin (VANCOCIN) IVPB 750 mg/150 ml premix     750 mg 150 mL/hr over 60 Minutes Intravenous  Once 11/17/17 0226 11/17/17 0513   11/16/17 1600  vancomycin (VANCOCIN) IVPB 750 mg/150 ml premix     750 mg 150 mL/hr over 60 Minutes Intravenous Every Sun (Hemodialysis) 11/16/17 1234 11/17/17 0428   11/16/17 0130  vancomycin (VANCOCIN) 1,500 mg in sodium chloride 0.9 % 500 mL IVPB     1,500 mg 250 mL/hr over 120 Minutes Intravenous  Once 11/16/17 0112 11/16/17 0836   11/16/17 0130  piperacillin-tazobactam (ZOSYN) IVPB 3.375 g  Status:  Discontinued     3.375 g 12.5 mL/hr over 240 Minutes Intravenous Every 12 hours 11/16/17 0112 11/20/17 0937   11/15/17 2245  cefTRIAXone (ROCEPHIN) 1 g in sodium chloride 0.9 % 100 mL IVPB     1 g 200 mL/hr over 30 Minutes Intravenous  Once 11/15/17 2242 11/15/17 2350   11/15/17 2245  azithromycin (ZITHROMAX) 500 mg in sodium chloride 0.9 % 250 mL IVPB  Status:   Discontinued     500 mg 250 mL/hr over 60 Minutes Intravenous  Once 11/15/17 2242 11/16/17 0104             DVT prophylaxis: None   Objective: Vitals:   11/22/17 2016 11/22/17 2338 11/23/17 0827 11/25/17 1131  BP: (!) 118/39 (!) 135/43 (!) 141/51 (!) 136/37  Pulse: 61 61 69 61  Resp:  20 17 18   Temp: 99.3 F (37.4 C) 99 F (37.2 C) 98.3 F (36.8 C) 98 F (36.7 C)  TempSrc:  Oral Oral Oral  SpO2: 98% 95% 97% 96%  Weight:      Height:        Intake/Output Summary (Last 24 hours) at 11/26/2017 1457 Last data filed at 11/25/2017 2200 Gross per 24 hour  Intake 240 ml  Output -  Net 240 ml   Filed Weights   11/19/17 1305 11/20/17 1450 11/20/17 1930  Weight: 98 kg (216 lb 0.8 oz) 97.9 kg (215 lb 13.3 oz) 94.3 kg (207 lb 14.3 oz)    Exam:  Except to verbal stimuli, answering yes/no questions, appears comfortable Symmetrical Chest wall movement, Good air movement bilaterally, CTAB RRR,No Gallops,Rubs or new Murmurs, No Parasternal Heave +ve B.Sounds, Abd Soft, No tenderness,  - guarding or rigidity. No Cyanosis, Clubbing or edema, No new Rash or bruise   Constitutional:normal appearing female, no distress  Data Reviewed: CBC: Recent Labs  Lab 11/22/17 0903  WBC 5.4  HGB 12.0  HCT 38.9  MCV 98.5  PLT 948*   Basic Metabolic Panel: Recent Labs  Lab 11/22/17 0903  NA 135  K 3.4*  CL 94*  CO2 27  GLUCOSE 151*  BUN 25*  CREATININE 5.19*  CALCIUM 8.7*   GFR: Estimated Creatinine Clearance: 10.9 mL/min (A) (by C-G formula based on SCr of 5.19 mg/dL (H)). Liver Function Tests: No results for input(s): AST, ALT, ALKPHOS, BILITOT, PROT, ALBUMIN in the last 168 hours. No results for input(s): LIPASE, AMYLASE in the last 168 hours. No results for input(s): AMMONIA in the last 168 hours. Coagulation Profile: Recent Labs  Lab 11/20/17 0319 11/21/17 0321 11/22/17 0234  INR 2.13 2.11 1.85   Cardiac Enzymes: No results for input(s): CKTOTAL, CKMB,  CKMBINDEX, TROPONINI in the last 168 hours. BNP (last 3 results) No results for input(s): PROBNP in the last 8760 hours. HbA1C: No results for input(s): HGBA1C in the last 72 hours. CBG: Recent Labs  Lab 11/22/17 0836 11/22/17 1330 11/22/17 1729 11/22/17 2109 11/23/17 0828  GLUCAP 142* 156* 304* 268* 174*   Lipid Profile: No results for input(s): CHOL, HDL, LDLCALC, TRIG, CHOLHDL, LDLDIRECT in the last 72 hours. Thyroid Function Tests: No results for input(s): TSH, T4TOTAL, FREET4, T3FREE, THYROIDAB in the last 72 hours. Anemia Panel: No results for input(s): VITAMINB12, FOLATE, FERRITIN, TIBC, IRON, RETICCTPCT in the last 72 hours. Urine analysis:    Component Value Date/Time   COLORURINE AMBER (A) 12/18/2016 0625   APPEARANCEUR HAZY (A) 12/18/2016 0625   LABSPEC 1.021 12/18/2016 0625   PHURINE 6.0 12/18/2016 0625   GLUCOSEU 150 (A) 12/18/2016 0625   HGBUR NEGATIVE 12/18/2016 0625   BILIRUBINUR NEGATIVE 12/18/2016 0625   KETONESUR NEGATIVE 12/18/2016 0625   PROTEINUR 100 (A) 12/18/2016 0625   UROBILINOGEN 0.2 03/02/2014 1508   NITRITE NEGATIVE 12/18/2016 0625   LEUKOCYTESUR SMALL (A) 12/18/2016 0625   Sepsis Labs: @LABRCNTIP (procalcitonin:4,lacticidven:4)  ) Recent Results (from the past 240 hour(s))  Culture, blood (Routine X 2) w Reflex to ID Panel     Status: None   Collection Time: 11/21/17  1:46 PM  Result Value Ref Range Status   Specimen Description BLOOD BLOOD RIGHT HAND  Final   Special Requests   Final    BOTTLES DRAWN AEROBIC AND ANAEROBIC Blood Culture adequate volume   Culture   Final    NO GROWTH 5 DAYS Performed at [facility], [place] [address] [place], [place] [address]    Report Status 11/26/2017 FINAL  Final  Culture, blood (Routine X 2) w Reflex to ID Panel     Status: None   Collection Time: 11/21/17  1:52 PM  Result Value Ref Range Status   Specimen Description BLOOD RIGHT ANTECUBITAL  Final   Special Requests   Final    BOTTLES  DRAWN AEROBIC AND ANAEROBIC Blood Culture results may not be optimal due to an inadequate volume of blood received in culture bottles   Culture   Final    NO GROWTH 5 DAYS Performed at [facility], [place] [address], [place],  [address]    Report Status 11/26/2017 FINAL  Final      Studies: No results found.  Scheduled Meds: . risperiDONE  0.5 mg Oral QHS  . rOPINIRole  0.25 mg Oral QHS  . sertraline  200 mg Oral Daily    Continuous Infusions:    LOS: 11 days     Briana [name]  Briana Convey, MD [facility] Pager [phone]  If 7PM-7AM, please contact night-coverage [email or web address] Password [email or web address] 11/26/2017, 2:57 PM

## 2017-11-27 NOTE — Progress Notes (Signed)
PROGRESS NOTE  NYJAH SCHWAKE VCB:[id number] DOB: Dec 28, 1945 DOA: 11/15/2017 PCP: Vidal Schwalbe, MD  HPI/Brief Narrative  Briana [name] is a 72 y.o. year old female with medical history significant for CAD with CABG 2005, right posterior occipital infarction, nocturnal hypoxia with 2 L oxygen at night, hypertension with LVH, end-stage renal disease with hemodialysis, chronic diastolic congestive heart failure/PAF with chronic Coumadin status post AICD placement  who presented on 11/15/2017 with daughter reporting new visual hallucinations and was found to have significant fluid overload and presumed PNA. On 6/29 Daughter decided to pursue hospice/comfort care. Further HD discontinued  Hospital course In the ED, patient was found to be afebrile with a respiratory rate of 29, SPO2 82% requiring 2 L of oxygen, heart rate 59-65, blood pressure 165/58.  Lab work was significant for procalcitonin 0.20, lactic acid 2.18Glucose 343, WBC 6.6, BUN 23, creatinine 3.77.    CT head showed no acute intracranial abnormalities.  Did demonstrate chronic atrophy and small vessel ischemic changes.  Chest x-ray showed progressive cardiomegaly with progressive identities of CHF.  Interval left basilar atelectasis and possible pneumonia  She was started empirically on IV vancomycin.  Given no marginal blood cultures patient was transitioned to IV Zosyn given concern for possible aspiration pneumonia.  She tolerated transition to oral Augmentin to complete a full course of therapy for CAP.  Unfortunately despite infectious work-up/treatment, consecutive HD sessions, optimization of psychiatric medications patient continued to persistently have visual hallucinations with intermittent confusion.  On 6/29 after discussion with nephrologist daughter decided to pursue hospice/comfort care and discontinued further HD sessions.   Subjective Pleasant in conversation. No complaints  Assessment/Plan: Principal  Problem:   Delirium due to another medical condition Active Problems:   Mixed hyperlipidemia   Pulmonary hypertension (HCC)   Physical deconditioning   Debility   Hx of CABG 2005   Paroxysmal atrial fibrillation (HCC)   Diabetes mellitus with end-stage renal disease ([place])   ESRD (end stage renal disease) on dialysis ([place])   Depression   Essential hypertension, benign   HCAP (healthcare-associated pneumonia)   Delirium   History of CVA (cerebrovascular accident)   Supplemental oxygen dependent   Chronic diastolic congestive heart failure (HCC)   ESRD on dialysis (HCC)   PAF (paroxysmal atrial fibrillation) (HCC)   Diabetes mellitus type 2 in obese (HCC)   Anemia of chronic disease   Thrombocytopenia (HCC)   Visual hallucinations   Lobar pneumonia (HCC)   Acute on chronic hypoxic respiratory failure, secondary to volume overload and possible PNA, stable. CXR showed pulmonary congestion on admission.  Established new dry weight with HD. No further HD given now comfort care. Continue o2 for comfort.  Plan for discharge to home hospice home, awaiting bed availability.  CAP. Left sided opacity on CXR concerning for likely PNA.  Completed full course of antibiotics  ESRD on HD.  Hypervolemic on admission, tolerated consecutive HD sessions to establish new dry weight.  6/29 for the HD sessions discontinued as daughter pursued hospice/comfort care.   PAF, rate controlled.  No longer on home Lopressor given comfort care measures  CAD s/p CABG. No chest pain.   Visual hallucinations, intermittent episodes. Unclear exact cause as medically infection has been ruled out with negative blood culture. Despite treatment of PNA and removal of volume with HD no improvement. Never had elevated BUN to suggest uremia. Restarted hom BZD and pain medicine direction from psychiatry consult with little improvement and initiation of risperdal.   HLD, stable,  T2DM, A1c 9.6 stable. Very little intake  throughout hospital stay with multiple fasting glucose less than 100. D/c CBG and insulin given comfort care.  Code Status: DNR, confirmed with daughter  Family Communication: None at bedside  Disposition Plan: Comfort care/ hospice, S/w consulted for assistance, awaiting bed availability  Consultants:  Nephrology, Psychiatry  Procedures:  none  Antimicrobials: Anti-infectives (From admission, onward)   Start     Dose/Rate Route Frequency Ordered Stop   11/20/17 1000  amoxicillin-clavulanate (AUGMENTIN) 875-125 MG per tablet 1 tablet  Status:  Discontinued     1 tablet Oral Every 12 hours 11/20/17 0937 11/20/17 0940   11/20/17 1000  amoxicillin-clavulanate (AUGMENTIN) 500-125 MG per tablet 500 mg     1 tablet Oral Daily 11/20/17 0940 11/22/17 0939   11/18/17 1200  vancomycin (VANCOCIN) IVPB 750 mg/150 ml premix  Status:  Discontinued     750 mg 150 mL/hr over 60 Minutes Intravenous Every T-Th-Sa (Hemodialysis) 11/16/17 0112 11/17/17 1808   11/17/17 0230  vancomycin (VANCOCIN) IVPB 750 mg/150 ml premix     750 mg 150 mL/hr over 60 Minutes Intravenous  Once 11/17/17 0226 11/17/17 0513   11/16/17 1600  vancomycin (VANCOCIN) IVPB 750 mg/150 ml premix     750 mg 150 mL/hr over 60 Minutes Intravenous Every Sun (Hemodialysis) 11/16/17 1234 11/17/17 0428   11/16/17 0130  vancomycin (VANCOCIN) 1,500 mg in sodium chloride 0.9 % 500 mL IVPB     1,500 mg 250 mL/hr over 120 Minutes Intravenous  Once 11/16/17 0112 11/16/17 0836   11/16/17 0130  piperacillin-tazobactam (ZOSYN) IVPB 3.375 g  Status:  Discontinued     3.375 g 12.5 mL/hr over 240 Minutes Intravenous Every 12 hours 11/16/17 0112 11/20/17 0937   11/15/17 2245  cefTRIAXone (ROCEPHIN) 1 g in sodium chloride 0.9 % 100 mL IVPB     1 g 200 mL/hr over 30 Minutes Intravenous  Once 11/15/17 2242 11/15/17 2350   11/15/17 2245  azithromycin (ZITHROMAX) 500 mg in sodium chloride 0.9 % 250 mL IVPB  Status:  Discontinued     500 mg 250  mL/hr over 60 Minutes Intravenous  Once 11/15/17 2242 11/16/17 0104             DVT prophylaxis: None   Objective: Vitals:   11/26/17 1809 11/27/17 0117 11/27/17 0528 11/27/17 0717  BP: (!) 138/47 (!) 125/48 (!) 106/48 (!) 100/45  Pulse: 72 70 80 (!) 58  Resp: 19 (!) 22 18 18   Temp: 98.2 F (36.8 C) 98.2 F (36.8 C) 98.6 F (37 C) 98.5 F (36.9 C)  TempSrc: Oral Oral Oral Oral  SpO2: 98% 95% 93% 90%  Weight:      Height:        Intake/Output Summary (Last 24 hours) at 11/27/2017 1236 Last data filed at 11/27/2017 0911 Gross per 24 hour  Intake 220 ml  Output -  Net 220 ml   Filed Weights   11/19/17 1305 11/20/17 1450 11/20/17 1930  Weight: 98 kg (216 lb 0.8 oz) 97.9 kg (215 lb 13.3 oz) 94.3 kg (207 lb 14.3 oz)    Exam:  Sitting in bed, denies any complaints, in no apparent distress Symmetrical Chest wall movement, Good air movement bilaterally, CTAB RRR,No Gallops,Rubs or new Murmurs, No Parasternal Heave +ve B.Sounds, Abd Soft, No tenderness,  - guarding or rigidity. No Cyanosis, Clubbing or edema, No new Rash or bruise     Data Reviewed: CBC: Recent Labs  Lab 11/22/17 0903  WBC  5.4  HGB 12.0  HCT 38.9  MCV 98.5  PLT 751*   Basic Metabolic Panel: Recent Labs  Lab 11/22/17 0903  NA 135  K 3.4*  CL 94*  CO2 27  GLUCOSE 151*  BUN 25*  CREATININE 5.19*  CALCIUM 8.7*   GFR: Estimated Creatinine Clearance: 10.9 mL/min (A) (by C-G formula based on SCr of 5.19 mg/dL (H)). Liver Function Tests: No results for input(s): AST, ALT, ALKPHOS, BILITOT, PROT, ALBUMIN in the last 168 hours. No results for input(s): LIPASE, AMYLASE in the last 168 hours. No results for input(s): AMMONIA in the last 168 hours. Coagulation Profile: Recent Labs  Lab 11/21/17 0321 11/22/17 0234  INR 2.11 1.85   Cardiac Enzymes: No results for input(s): CKTOTAL, CKMB, CKMBINDEX, TROPONINI in the last 168 hours. BNP (last 3 results) No results for input(s): PROBNP in  the last 8760 hours. HbA1C: No results for input(s): HGBA1C in the last 72 hours. CBG: Recent Labs  Lab 11/22/17 0836 11/22/17 1330 11/22/17 1729 11/22/17 2109 11/23/17 0828  GLUCAP 142* 156* 304* 268* 174*   Lipid Profile: No results for input(s): CHOL, HDL, LDLCALC, TRIG, CHOLHDL, LDLDIRECT in the last 72 hours. Thyroid Function Tests: No results for input(s): TSH, T4TOTAL, FREET4, T3FREE, THYROIDAB in the last 72 hours. Anemia Panel: No results for input(s): VITAMINB12, FOLATE, FERRITIN, TIBC, IRON, RETICCTPCT in the last 72 hours. Urine analysis:    Component Value Date/Time   COLORURINE AMBER (A) 12/18/2016 0625   APPEARANCEUR HAZY (A) 12/18/2016 0625   LABSPEC 1.021 12/18/2016 0625   PHURINE 6.0 12/18/2016 0625   GLUCOSEU 150 (A) 12/18/2016 0625   HGBUR NEGATIVE 12/18/2016 0625   BILIRUBINUR NEGATIVE 12/18/2016 0625   KETONESUR NEGATIVE 12/18/2016 0625   PROTEINUR 100 (A) 12/18/2016 0625   UROBILINOGEN 0.2 03/02/2014 1508   NITRITE NEGATIVE 12/18/2016 0625   LEUKOCYTESUR SMALL (A) 12/18/2016 0625   Sepsis Labs: @LABRCNTIP (procalcitonin:4,lacticidven:4)  ) Recent Results (from the past 240 hour(s))  Culture, blood (Routine X 2) w Reflex to ID Panel     Status: None   Collection Time: 11/21/17  1:46 PM  Result Value Ref Range Status   Specimen Description BLOOD BLOOD RIGHT HAND  Final   Special Requests   Final    BOTTLES DRAWN AEROBIC AND ANAEROBIC Blood Culture adequate volume   Culture   Final    NO GROWTH 5 DAYS Performed at [facility], [place] [address] [place], [place] [address]    Report Status 11/26/2017 FINAL  Final  Culture, blood (Routine X 2) w Reflex to ID Panel     Status: None   Collection Time: 11/21/17  1:52 PM  Result Value Ref Range Status   Specimen Description BLOOD RIGHT ANTECUBITAL  Final   Special Requests   Final    BOTTLES DRAWN AEROBIC AND ANAEROBIC Blood Culture results may not be optimal due to an inadequate volume of  blood received in culture bottles   Culture   Final    NO GROWTH 5 DAYS Performed at [facility], [place] [address], [place], [place] [address]    Report Status 11/26/2017 FINAL  Final      Studies: No results found.  Scheduled Meds: . risperiDONE  0.5 mg Oral QHS  . rOPINIRole  0.25 mg Oral QHS  . sertraline  200 mg Oral Daily    Continuous Infusions:    LOS: 12 days     Phillips Climes, MD [facility] Pager [phone]  If  7PM-7AM, please contact night-coverage [email or web address] Password [facility] 11/27/2017, 12:36 PM

## 2017-11-27 NOTE — Progress Notes (Signed)
CSW spoke with [facility] patient is now #1 on the waitlist.  CSW will continue to follow  Jorge Ny, [place] Social Worker [phone]

## 2017-11-28 NOTE — Progress Notes (Signed)
PROGRESS NOTE  Briana [name] NID:[id number] DOB: 04-Jul-1945 DOA: 11/15/2017 PCP: Vidal Schwalbe, MD  HPI/Brief Narrative  Briana [name] is a 72 y.o. year old female with medical history significant for CAD with CABG 2005, right posterior occipital infarction, nocturnal hypoxia with 2 L oxygen at night, hypertension with LVH, end-stage renal disease with hemodialysis, chronic diastolic congestive heart failure/PAF with chronic Coumadin status post AICD placement  who presented on 11/15/2017 with daughter reporting new visual hallucinations and was found to have significant fluid overload and presumed PNA. On 6/29 Daughter decided to pursue hospice/comfort care. Further HD discontinued  Hospital course In the ED, patient was found to be afebrile with a respiratory rate of 29, SPO2 82% requiring 2 L of oxygen, heart rate 59-65, blood pressure 165/58.  Lab work was significant for procalcitonin 0.20, lactic acid 2.18Glucose 343, WBC 6.6, BUN 23, creatinine 3.77.    CT head showed no acute intracranial abnormalities.  Did demonstrate chronic atrophy and small vessel ischemic changes.  Chest x-ray showed progressive cardiomegaly with progressive identities of CHF.  Interval left basilar atelectasis and possible pneumonia  She was started empirically on IV vancomycin.  Given no marginal blood cultures patient was transitioned to IV Zosyn given concern for possible aspiration pneumonia.  She tolerated transition to oral Augmentin to complete a full course of therapy for CAP.  Unfortunately despite infectious work-up/treatment, consecutive HD sessions, optimization of psychiatric medications patient continued to persistently have visual hallucinations with intermittent confusion.  On 6/29 after discussion with nephrologist daughter decided to pursue hospice/comfort care and discontinued further HD sessions.   Subjective Pleasant in conversation.  Reports some lower back  pain  Assessment/Plan: Principal Problem:   Delirium due to another medical condition Active Problems:   Mixed hyperlipidemia   Pulmonary hypertension (HCC)   Physical deconditioning   Debility   Hx of CABG 2005   Paroxysmal atrial fibrillation (HCC)   Diabetes mellitus with end-stage renal disease ([place])   ESRD (end stage renal disease) on dialysis ([place])   Depression   Essential hypertension, benign   HCAP (healthcare-associated pneumonia)   Delirium   History of CVA (cerebrovascular accident)   Supplemental oxygen dependent   Chronic diastolic congestive heart failure (HCC)   ESRD on dialysis (HCC)   PAF (paroxysmal atrial fibrillation) (HCC)   Diabetes mellitus type 2 in obese (HCC)   Anemia of chronic disease   Thrombocytopenia (HCC)   Visual hallucinations   Lobar pneumonia (HCC)   Acute on chronic hypoxic respiratory failure, secondary to volume overload and possible PNA, stable. CXR showed pulmonary congestion on admission.  Established new dry weight with HD. No further HD given now comfort care. Continue o2 for comfort.  Plan for discharge to hospice home, awaiting bed availability.  CAP. Left sided opacity on CXR concerning for likely PNA.  Completed full course of antibiotics  ESRD on HD.  Hypervolemic on admission, tolerated consecutive HD sessions to establish new dry weight.  6/29 for the HD sessions discontinued as daughter pursued hospice/comfort care.   PAF, rate controlled.  No longer on home Lopressor given comfort care measures  CAD s/p CABG. No chest pain.   Visual hallucinations, intermittent episodes. Unclear exact cause as medically infection has been ruled out with negative blood culture. Despite treatment of PNA and removal of volume with HD no improvement. Never had elevated BUN to suggest uremia. Restarted hom BZD and pain medicine direction from psychiatry consult with little improvement and initiation of risperdal.  HLD, stable,   T2DM, A1c  9.6 stable. Very little intake throughout hospital stay with multiple fasting glucose less than 100. D/c CBG and insulin given comfort care.  Code Status: DNR, confirmed with daughter  Family Communication: None at bedside  Disposition Plan: Comfort care/ hospice, S/w consulted for assistance, awaiting bed availability  Consultants:  Nephrology, Psychiatry  Procedures:  none  Antimicrobials: Anti-infectives (From admission, onward)   Start     Dose/Rate Route Frequency Ordered Stop   11/20/17 1000  amoxicillin-clavulanate (AUGMENTIN) 875-125 MG per tablet 1 tablet  Status:  Discontinued     1 tablet Oral Every 12 hours 11/20/17 0937 11/20/17 0940   11/20/17 1000  amoxicillin-clavulanate (AUGMENTIN) 500-125 MG per tablet 500 mg     1 tablet Oral Daily 11/20/17 0940 11/22/17 0939   11/18/17 1200  vancomycin (VANCOCIN) IVPB 750 mg/150 ml premix  Status:  Discontinued     750 mg 150 mL/hr over 60 Minutes Intravenous Every T-Th-Sa (Hemodialysis) 11/16/17 0112 11/17/17 1808   11/17/17 0230  vancomycin (VANCOCIN) IVPB 750 mg/150 ml premix     750 mg 150 mL/hr over 60 Minutes Intravenous  Once 11/17/17 0226 11/17/17 0513   11/16/17 1600  vancomycin (VANCOCIN) IVPB 750 mg/150 ml premix     750 mg 150 mL/hr over 60 Minutes Intravenous Every Sun (Hemodialysis) 11/16/17 1234 11/17/17 0428   11/16/17 0130  vancomycin (VANCOCIN) 1,500 mg in sodium chloride 0.9 % 500 mL IVPB     1,500 mg 250 mL/hr over 120 Minutes Intravenous  Once 11/16/17 0112 11/16/17 0836   11/16/17 0130  piperacillin-tazobactam (ZOSYN) IVPB 3.375 g  Status:  Discontinued     3.375 g 12.5 mL/hr over 240 Minutes Intravenous Every 12 hours 11/16/17 0112 11/20/17 0937   11/15/17 2245  cefTRIAXone (ROCEPHIN) 1 g in sodium chloride 0.9 % 100 mL IVPB     1 g 200 mL/hr over 30 Minutes Intravenous  Once 11/15/17 2242 11/15/17 2350   11/15/17 2245  azithromycin (ZITHROMAX) 500 mg in sodium chloride 0.9 % 250 mL IVPB  Status:   Discontinued     500 mg 250 mL/hr over 60 Minutes Intravenous  Once 11/15/17 2242 11/16/17 0104             DVT prophylaxis: None   Objective: Vitals:   11/27/17 0528 11/27/17 0717 11/27/17 1532 11/28/17 0446  BP: (!) 106/48 (!) 100/45 (!) 137/53 (!) 111/42  Pulse: 80 (!) 58 65 (!) 59  Resp: 18 18 16 16   Temp: 98.6 F (37 C) 98.5 F (36.9 C) 98.4 F (36.9 C) 98.2 F (36.8 C)  TempSrc: Oral Oral Oral Oral  SpO2: 93% 90% 92% 95%  Weight:      Height:        Intake/Output Summary (Last 24 hours) at 11/28/2017 1352 Last data filed at 11/27/2017 1700 Gross per 24 hour  Intake 60 ml  Output -  Net 60 ml   Filed Weights   11/19/17 1305 11/20/17 1450 11/20/17 1930  Weight: 98 kg (216 lb 0.8 oz) 97.9 kg (215 lb 13.3 oz) 94.3 kg (207 lb 14.3 oz)    Exam:  Sitting in bed, in no apparent distress Symmetrical Chest wall movement, Good air movement bilaterally, CTAB RRR,No Gallops,Rubs or new Murmurs, No Parasternal Heave +ve B.Sounds, Abd Soft, No tenderness,  - guarding or rigidity. No Cyanosis, Clubbing or edema, No new Rash or bruise     Data Reviewed: CBC: Recent Labs  Lab 11/22/17 [phone]  WBC 5.4  HGB 12.0  HCT 38.9  MCV 98.5  PLT 659*   Basic Metabolic Panel: Recent Labs  Lab 11/22/17 0903  NA 135  K 3.4*  CL 94*  CO2 27  GLUCOSE 151*  BUN 25*  CREATININE 5.19*  CALCIUM 8.7*   GFR: Estimated Creatinine Clearance: 10.9 mL/min (A) (by C-G formula based on SCr of 5.19 mg/dL (H)). Liver Function Tests: No results for input(s): AST, ALT, ALKPHOS, BILITOT, PROT, ALBUMIN in the last 168 hours. No results for input(s): LIPASE, AMYLASE in the last 168 hours. No results for input(s): AMMONIA in the last 168 hours. Coagulation Profile: Recent Labs  Lab 11/22/17 0234  INR 1.85   Cardiac Enzymes: No results for input(s): CKTOTAL, CKMB, CKMBINDEX, TROPONINI in the last 168 hours. BNP (last 3 results) No results for input(s): PROBNP in the last 8760  hours. HbA1C: No results for input(s): HGBA1C in the last 72 hours. CBG: Recent Labs  Lab 11/22/17 0836 11/22/17 1330 11/22/17 1729 11/22/17 2109 11/23/17 0828  GLUCAP 142* 156* 304* 268* 174*   Lipid Profile: No results for input(s): CHOL, HDL, LDLCALC, TRIG, CHOLHDL, LDLDIRECT in the last 72 hours. Thyroid Function Tests: No results for input(s): TSH, T4TOTAL, FREET4, T3FREE, THYROIDAB in the last 72 hours. Anemia Panel: No results for input(s): VITAMINB12, FOLATE, FERRITIN, TIBC, IRON, RETICCTPCT in the last 72 hours. Urine analysis:    Component Value Date/Time   COLORURINE AMBER (A) 12/18/2016 0625   APPEARANCEUR HAZY (A) 12/18/2016 0625   LABSPEC 1.021 12/18/2016 0625   PHURINE 6.0 12/18/2016 0625   GLUCOSEU 150 (A) 12/18/2016 0625   HGBUR NEGATIVE 12/18/2016 0625   BILIRUBINUR NEGATIVE 12/18/2016 0625   KETONESUR NEGATIVE 12/18/2016 0625   PROTEINUR 100 (A) 12/18/2016 0625   UROBILINOGEN 0.2 03/02/2014 1508   NITRITE NEGATIVE 12/18/2016 0625   LEUKOCYTESUR SMALL (A) 12/18/2016 0625   Sepsis Labs: @LABRCNTIP (procalcitonin:4,lacticidven:4)  ) Recent Results (from the past 240 hour(s))  Culture, blood (Routine X 2) w Reflex to ID Panel     Status: None   Collection Time: 11/21/17  1:46 PM  Result Value Ref Range Status   Specimen Description BLOOD BLOOD RIGHT HAND  Final   Special Requests   Final    BOTTLES DRAWN AEROBIC AND ANAEROBIC Blood Culture adequate volume   Culture   Final    NO GROWTH 5 DAYS Performed at [facility], [place] [address] [place], [place] [address]    Report Status 11/26/2017 FINAL  Final  Culture, blood (Routine X 2) w Reflex to ID Panel     Status: None   Collection Time: 11/21/17  1:52 PM  Result Value Ref Range Status   Specimen Description BLOOD RIGHT ANTECUBITAL  Final   Special Requests   Final    BOTTLES DRAWN AEROBIC AND ANAEROBIC Blood Culture results may not be optimal due to an inadequate volume of blood received in  culture bottles   Culture   Final    NO GROWTH 5 DAYS Performed at [facility], [place] [address] [place], [place] [address]    Report Status 11/26/2017 FINAL  Final      Studies: No results found.  Scheduled Meds: . risperiDONE  0.5 mg Oral QHS  . rOPINIRole  0.25 mg Oral QHS  . sertraline  200 mg Oral Daily    Continuous Infusions:    LOS: 13 days     Phillips Climes, MD [facility] Pager [phone]  If 7PM-7AM, please  contact night-coverage [email or web address] Password [email or web address] 11/28/2017, 1:52 PM

## 2017-11-29 NOTE — Plan of Care (Signed)
Patient was sleeping and non-talkative during this shift.  The only question the patient responded to was stating her full name and date of birth.  Patient also responded to pain when she was repositioned.  Patient did not display any teach back.

## 2017-11-29 NOTE — Progress Notes (Signed)
PROGRESS NOTE  Briana [name] RUE:[id number] DOB: Jul 29, 1945 DOA: 11/15/2017 PCP: Vidal Schwalbe, MD  HPI/Brief Narrative  Briana [name] is a 72 y.o. year old female with medical history significant for CAD with CABG 2005, right posterior occipital infarction, nocturnal hypoxia with 2 L oxygen at night, hypertension with LVH, end-stage renal disease with hemodialysis, chronic diastolic congestive heart failure/PAF with chronic Coumadin status post AICD placement  who presented on 11/15/2017 with daughter reporting new visual hallucinations and was found to have significant fluid overload and presumed PNA. On 6/29 Daughter decided to pursue hospice/comfort care. Further HD discontinued  Hospital course In the ED, patient was found to be afebrile with a respiratory rate of 29, SPO2 82% requiring 2 L of oxygen, heart rate 59-65, blood pressure 165/58.  Lab work was significant for procalcitonin 0.20, lactic acid 2.18Glucose 343, WBC 6.6, BUN 23, creatinine 3.77.    CT head showed no acute intracranial abnormalities.  Did demonstrate chronic atrophy and small vessel ischemic changes.  Chest x-ray showed progressive cardiomegaly with progressive identities of CHF.  Interval left basilar atelectasis and possible pneumonia  She was started empirically on IV vancomycin.  Given no marginal blood cultures patient was transitioned to IV Zosyn given concern for possible aspiration pneumonia.  She tolerated transition to oral Augmentin to complete a full course of therapy for CAP.  Unfortunately despite infectious work-up/treatment, consecutive HD sessions, optimization of psychiatric medications patient continued to persistently have visual hallucinations with intermittent confusion.  On 6/29 after discussion with nephrologist daughter decided to pursue hospice/comfort care and discontinued further HD sessions.   Subjective Pleasant in conversation.  Reports some lower back  pain  Assessment/Plan: Principal Problem:   Delirium due to another medical condition Active Problems:   Mixed hyperlipidemia   Pulmonary hypertension (HCC)   Physical deconditioning   Debility   Hx of CABG 2005   Paroxysmal atrial fibrillation (HCC)   Diabetes mellitus with end-stage renal disease ([place])   ESRD (end stage renal disease) on dialysis ([place])   Depression   Essential hypertension, benign   HCAP (healthcare-associated pneumonia)   Delirium   History of CVA (cerebrovascular accident)   Supplemental oxygen dependent   Chronic diastolic congestive heart failure (HCC)   ESRD on dialysis (HCC)   PAF (paroxysmal atrial fibrillation) (HCC)   Diabetes mellitus type 2 in obese (HCC)   Anemia of chronic disease   Thrombocytopenia (HCC)   Visual hallucinations   Lobar pneumonia (HCC)   Acute on chronic hypoxic respiratory failure, secondary to volume overload and possible PNA, stable. CXR showed pulmonary congestion on admission.  Established new dry weight with HD. No further HD given now comfort care. Continue o2 for comfort.  Plan for discharge to hospice home, awaiting bed availability.  CAP. Left sided opacity on CXR concerning for likely PNA.  Completed full course of antibiotics  ESRD on HD.  Hypervolemic on admission, tolerated consecutive HD sessions to establish new dry weight.  6/29 for the HD sessions discontinued as daughter pursued hospice/comfort care.   PAF, rate controlled.  No longer on home Lopressor given comfort care measures  CAD s/p CABG. No chest pain.   Visual hallucinations, intermittent episodes. Unclear exact cause as medically infection has been ruled out with negative blood culture. Despite treatment of PNA and removal of volume with HD no improvement. Never had elevated BUN to suggest uremia. Restarted hom BZD and pain medicine direction from psychiatry consult with little improvement and initiation of risperdal.  HLD, stable,   T2DM, A1c  9.6 stable. Very little intake throughout hospital stay with multiple fasting glucose less than 100. D/c CBG and insulin given comfort care.  Code Status: DNR, confirmed with daughter  Family Communication: None at bedside  Disposition Plan: Comfort care/ hospice, S/w consulted for assistance, awaiting bed availability  Consultants:  Nephrology, Psychiatry  Procedures:  none  Antimicrobials: Anti-infectives (From admission, onward)   Start     Dose/Rate Route Frequency Ordered Stop   11/20/17 1000  amoxicillin-clavulanate (AUGMENTIN) 875-125 MG per tablet 1 tablet  Status:  Discontinued     1 tablet Oral Every 12 hours 11/20/17 0937 11/20/17 0940   11/20/17 1000  amoxicillin-clavulanate (AUGMENTIN) 500-125 MG per tablet 500 mg     1 tablet Oral Daily 11/20/17 0940 11/22/17 0939   11/18/17 1200  vancomycin (VANCOCIN) IVPB 750 mg/150 ml premix  Status:  Discontinued     750 mg 150 mL/hr over 60 Minutes Intravenous Every T-Th-Sa (Hemodialysis) 11/16/17 0112 11/17/17 1808   11/17/17 0230  vancomycin (VANCOCIN) IVPB 750 mg/150 ml premix     750 mg 150 mL/hr over 60 Minutes Intravenous  Once 11/17/17 0226 11/17/17 0513   11/16/17 1600  vancomycin (VANCOCIN) IVPB 750 mg/150 ml premix     750 mg 150 mL/hr over 60 Minutes Intravenous Every Sun (Hemodialysis) 11/16/17 1234 11/17/17 0428   11/16/17 0130  vancomycin (VANCOCIN) 1,500 mg in sodium chloride 0.9 % 500 mL IVPB     1,500 mg 250 mL/hr over 120 Minutes Intravenous  Once 11/16/17 0112 11/16/17 0836   11/16/17 0130  piperacillin-tazobactam (ZOSYN) IVPB 3.375 g  Status:  Discontinued     3.375 g 12.5 mL/hr over 240 Minutes Intravenous Every 12 hours 11/16/17 0112 11/20/17 0937   11/15/17 2245  cefTRIAXone (ROCEPHIN) 1 g in sodium chloride 0.9 % 100 mL IVPB     1 g 200 mL/hr over 30 Minutes Intravenous  Once 11/15/17 2242 11/15/17 2350   11/15/17 2245  azithromycin (ZITHROMAX) 500 mg in sodium chloride 0.9 % 250 mL IVPB  Status:   Discontinued     500 mg 250 mL/hr over 60 Minutes Intravenous  Once 11/15/17 2242 11/16/17 0104             DVT prophylaxis: None   Objective: Vitals:   11/27/17 0528 11/27/17 0717 11/27/17 1532 11/28/17 0446  BP: (!) 106/48 (!) 100/45 (!) 137/53 (!) 111/42  Pulse: 80 (!) 58 65 (!) 59  Resp: 18 18 16 16   Temp: 98.6 F (37 C) 98.5 F (36.9 C) 98.4 F (36.9 C) 98.2 F (36.8 C)  TempSrc: Oral Oral Oral Oral  SpO2: 93% 90% 92% 95%  Weight:      Height:        Intake/Output Summary (Last 24 hours) at 11/29/2017 1602 Last data filed at 11/29/2017 0830 Gross per 24 hour  Intake 50 ml  Output -  Net 50 ml   Filed Weights   11/19/17 1305 11/20/17 1450 11/20/17 1930  Weight: 98 kg (216 lb 0.8 oz) 97.9 kg (215 lb 13.3 oz) 94.3 kg (207 lb 14.3 oz)    Exam:  She is more lethargic and confused today, otherwise in no apparent distress Good air entry bilaterally RRR,No Gallops,Rubs or new Murmurs, No Parasternal Heave Abd Soft, No tenderness,  - guarding or rigidity. No leg edema   Data Reviewed: CBC: No results for input(s): WBC, NEUTROABS, HGB, HCT, MCV, PLT in the last 168 hours. Basic Metabolic Panel:  No results for input(s): NA, K, CL, CO2, GLUCOSE, BUN, CREATININE, CALCIUM, MG, PHOS in the last 168 hours. GFR: Estimated Creatinine Clearance: 10.9 mL/min (A) (by C-G formula based on SCr of 5.19 mg/dL (H)). Liver Function Tests: No results for input(s): AST, ALT, ALKPHOS, BILITOT, PROT, ALBUMIN in the last 168 hours. No results for input(s): LIPASE, AMYLASE in the last 168 hours. No results for input(s): AMMONIA in the last 168 hours. Coagulation Profile: No results for input(s): INR, PROTIME in the last 168 hours. Cardiac Enzymes: No results for input(s): CKTOTAL, CKMB, CKMBINDEX, TROPONINI in the last 168 hours. BNP (last 3 results) No results for input(s): PROBNP in the last 8760 hours. HbA1C: No results for input(s): HGBA1C in the last 72  hours. CBG: Recent Labs  Lab 11/22/17 1729 11/22/17 2109 11/23/17 0828  GLUCAP 304* 268* 174*   Lipid Profile: No results for input(s): CHOL, HDL, LDLCALC, TRIG, CHOLHDL, LDLDIRECT in the last 72 hours. Thyroid Function Tests: No results for input(s): TSH, T4TOTAL, FREET4, T3FREE, THYROIDAB in the last 72 hours. Anemia Panel: No results for input(s): VITAMINB12, FOLATE, FERRITIN, TIBC, IRON, RETICCTPCT in the last 72 hours. Urine analysis:    Component Value Date/Time   COLORURINE AMBER (A) 12/18/2016 0625   APPEARANCEUR HAZY (A) 12/18/2016 0625   LABSPEC 1.021 12/18/2016 0625   PHURINE 6.0 12/18/2016 0625   GLUCOSEU 150 (A) 12/18/2016 0625   HGBUR NEGATIVE 12/18/2016 0625   BILIRUBINUR NEGATIVE 12/18/2016 0625   KETONESUR NEGATIVE 12/18/2016 0625   PROTEINUR 100 (A) 12/18/2016 0625   UROBILINOGEN 0.2 03/02/2014 1508   NITRITE NEGATIVE 12/18/2016 0625   LEUKOCYTESUR SMALL (A) 12/18/2016 0625   Sepsis Labs: @LABRCNTIP (procalcitonin:4,lacticidven:4)  ) Recent Results (from the past 240 hour(s))  Culture, blood (Routine X 2) w Reflex to ID Panel     Status: None   Collection Time: 11/21/17  1:46 PM  Result Value Ref Range Status   Specimen Description BLOOD BLOOD RIGHT HAND  Final   Special Requests   Final    BOTTLES DRAWN AEROBIC AND ANAEROBIC Blood Culture adequate volume   Culture   Final    NO GROWTH 5 DAYS Performed at [facility], [place] [address], [place], [place] [address]    Report Status 11/26/2017 FINAL  Final  Culture, blood (Routine X 2) w Reflex to ID Panel     Status: None   Collection Time: 11/21/17  1:52 PM  Result Value Ref Range Status   Specimen Description BLOOD RIGHT ANTECUBITAL  Final   Special Requests   Final    BOTTLES DRAWN AEROBIC AND ANAEROBIC Blood Culture results may not be optimal due to an inadequate volume of blood received in culture bottles   Culture   Final    NO GROWTH 5 DAYS Performed at [facility], [place]  [address] [place], [place] [address]    Report Status 11/26/2017 FINAL  Final      Studies: No results found.  Scheduled Meds: . risperiDONE  0.5 mg Oral QHS  . rOPINIRole  0.25 mg Oral QHS  . sertraline  200 mg Oral Daily    Continuous Infusions:    LOS: 14 days     Phillips Climes, MD [facility] Pager [phone]  If 7PM-7AM, please contact night-coverage [email or web address] Password [email or web address] 11/29/2017, 4:02 PM

## 2017-11-30 NOTE — Progress Notes (Signed)
PROGRESS NOTE  Briana [name] XBM:[id number] DOB: 1945/08/01 DOA: 11/15/2017 PCP: Vidal Schwalbe, MD  HPI/Brief Narrative   Briana [name] is a 72 y.o. year old female with medical history significant for CAD with CABG 2005, right posterior occipital infarction, nocturnal hypoxia with 2 L oxygen at night, hypertension with LVH, end-stage renal disease with hemodialysis, chronic diastolic congestive heart failure/PAF with chronic Coumadin status post AICD placement  who presented on 11/15/2017 with daughter reporting new visual hallucinations and was found to have significant fluid overload and presumed PNA. On 6/29 Daughter decided to pursue hospice/comfort care. Further HD discontinued  On 6/29 after discussion with nephrologist ,daughter decided to pursue hospice/comfort care and discontinued further HD sessions.   Subjective Pleasant in conversation. She denies any complaints today.  Assessment/Plan: Principal Problem:   Delirium due to another medical condition Active Problems:   Mixed hyperlipidemia   Pulmonary hypertension (HCC)   Physical deconditioning   Debility   Hx of CABG 2005   Paroxysmal atrial fibrillation (HCC)   Diabetes mellitus with end-stage renal disease ([place])   ESRD (end stage renal disease) on dialysis ([place])   Depression   Essential hypertension, benign   HCAP (healthcare-associated pneumonia)   Delirium   History of CVA (cerebrovascular accident)   Supplemental oxygen dependent   Chronic diastolic congestive heart failure (HCC)   ESRD on dialysis (HCC)   PAF (paroxysmal atrial fibrillation) (HCC)   Diabetes mellitus type 2 in obese (HCC)   Anemia of chronic disease   Thrombocytopenia (HCC)   Visual hallucinations   Lobar pneumonia (HCC)   Acute on chronic hypoxic respiratory failure, secondary to volume overload and possible PNA, stable. CXR showed pulmonary congestion on admission.  Established new dry weight with HD. No further HD given  now comfort care. Continue o2 for comfort.  Plan for discharge to hospice home, awaiting bed availability.  CAP. Left sided opacity on CXR concerning for likely PNA.  Completed full course of antibiotics  ESRD on HD.  Hypervolemic on admission, tolerated consecutive HD sessions to establish new dry weight.  6/29 for the HD sessions discontinued as daughter pursued hospice/comfort care.   PAF, rate controlled.  No longer on home Lopressor given comfort care measures  CAD s/p CABG. No chest pain.   Visual hallucinations, intermittent episodes. Unclear exact cause as medically infection has been ruled out with negative blood culture. Despite treatment of PNA and removal of volume with HD no improvement. Never had elevated BUN to suggest uremia. Restarted hom BZD and pain medicine direction from psychiatry consult with little improvement and initiation of risperdal.   HLD, stable,   T2DM, A1c 9.6 stable. Very little intake throughout hospital stay with multiple fasting glucose less than 100. D/c CBG and insulin given comfort care.  Code Status: DNR.  Family Communication: None at bedside  Disposition Plan: Comfort care/ hospice, S/w consulted for assistance, awaiting bed availability  Consultants:  Nephrology, Psychiatry  Procedures:  none  Antimicrobials: Anti-infectives (From admission, onward)   Start     Dose/Rate Route Frequency Ordered Stop   11/20/17 1000  amoxicillin-clavulanate (AUGMENTIN) 875-125 MG per tablet 1 tablet  Status:  Discontinued     1 tablet Oral Every 12 hours 11/20/17 0937 11/20/17 0940   11/20/17 1000  amoxicillin-clavulanate (AUGMENTIN) 500-125 MG per tablet 500 mg     1 tablet Oral Daily 11/20/17 0940 11/22/17 0939   11/18/17 1200  vancomycin (VANCOCIN) IVPB 750 mg/150 ml premix  Status:  Discontinued  750 mg 150 mL/hr over 60 Minutes Intravenous Every T-Th-Sa (Hemodialysis) 11/16/17 0112 11/17/17 1808   11/17/17 0230  vancomycin (VANCOCIN) IVPB 750  mg/150 ml premix     750 mg 150 mL/hr over 60 Minutes Intravenous  Once 11/17/17 0226 11/17/17 0513   11/16/17 1600  vancomycin (VANCOCIN) IVPB 750 mg/150 ml premix     750 mg 150 mL/hr over 60 Minutes Intravenous Every Sun (Hemodialysis) 11/16/17 1234 11/17/17 0428   11/16/17 0130  vancomycin (VANCOCIN) 1,500 mg in sodium chloride 0.9 % 500 mL IVPB     1,500 mg 250 mL/hr over 120 Minutes Intravenous  Once 11/16/17 0112 11/16/17 0836   11/16/17 0130  piperacillin-tazobactam (ZOSYN) IVPB 3.375 g  Status:  Discontinued     3.375 g 12.5 mL/hr over 240 Minutes Intravenous Every 12 hours 11/16/17 0112 11/20/17 0937   11/15/17 2245  cefTRIAXone (ROCEPHIN) 1 g in sodium chloride 0.9 % 100 mL IVPB     1 g 200 mL/hr over 30 Minutes Intravenous  Once 11/15/17 2242 11/15/17 2350   11/15/17 2245  azithromycin (ZITHROMAX) 500 mg in sodium chloride 0.9 % 250 mL IVPB  Status:  Discontinued     500 mg 250 mL/hr over 60 Minutes Intravenous  Once 11/15/17 2242 11/16/17 0104             DVT prophylaxis: None   Objective: Vitals:   11/27/17 1532 11/28/17 0446 11/29/17 2348 11/30/17 0725  BP: (!) 137/53 (!) 111/42 120/61 (!) 108/45  Pulse: 65 (!) 59 60 68  Resp: 16 16 17 19   Temp: 98.4 F (36.9 C) 98.2 F (36.8 C) 98.3 F (36.8 C) 98 F (36.7 C)  TempSrc: Oral Oral Oral Oral  SpO2: 92% 95% 95% 96%  Weight:      Height:       No intake or output data in the 24 hours ending 11/30/17 1034 Filed Weights   11/19/17 1305 11/20/17 1450 11/20/17 1930  Weight: 98 kg (216 lb 0.8 oz) 97.9 kg (215 lb 13.3 oz) 94.3 kg (207 lb 14.3 oz)    Exam:  In bed, in no apparent distress, mildly confused . Good air entry bilaterally  Abdomen soft nontender  No leg edema    Data Reviewed: CBC: No results for input(s): WBC, NEUTROABS, HGB, HCT, MCV, PLT in the last 168 hours. Basic Metabolic Panel: No results for input(s): NA, K, CL, CO2, GLUCOSE, BUN, CREATININE, CALCIUM, MG, PHOS in the last 168  hours. GFR: Estimated Creatinine Clearance: 10.9 mL/min (A) (by C-G formula based on SCr of 5.19 mg/dL (H)). Liver Function Tests: No results for input(s): AST, ALT, ALKPHOS, BILITOT, PROT, ALBUMIN in the last 168 hours. No results for input(s): LIPASE, AMYLASE in the last 168 hours. No results for input(s): AMMONIA in the last 168 hours. Coagulation Profile: No results for input(s): INR, PROTIME in the last 168 hours. Cardiac Enzymes: No results for input(s): CKTOTAL, CKMB, CKMBINDEX, TROPONINI in the last 168 hours. BNP (last 3 results) No results for input(s): PROBNP in the last 8760 hours. HbA1C: No results for input(s): HGBA1C in the last 72 hours. CBG: No results for input(s): GLUCAP in the last 168 hours. Lipid Profile: No results for input(s): CHOL, HDL, LDLCALC, TRIG, CHOLHDL, LDLDIRECT in the last 72 hours. Thyroid Function Tests: No results for input(s): TSH, T4TOTAL, FREET4, T3FREE, THYROIDAB in the last 72 hours. Anemia Panel: No results for input(s): VITAMINB12, FOLATE, FERRITIN, TIBC, IRON, RETICCTPCT in the last 72 hours. Urine analysis:  Component Value Date/Time   COLORURINE AMBER (A) 12/18/2016 0625   APPEARANCEUR HAZY (A) 12/18/2016 0625   LABSPEC 1.021 12/18/2016 0625   PHURINE 6.0 12/18/2016 0625   GLUCOSEU 150 (A) 12/18/2016 0625   HGBUR NEGATIVE 12/18/2016 0625   BILIRUBINUR NEGATIVE 12/18/2016 0625   KETONESUR NEGATIVE 12/18/2016 0625   PROTEINUR 100 (A) 12/18/2016 0625   UROBILINOGEN 0.2 03/02/2014 1508   NITRITE NEGATIVE 12/18/2016 0625   LEUKOCYTESUR SMALL (A) 12/18/2016 0625   Sepsis Labs: @LABRCNTIP (procalcitonin:4,lacticidven:4)  ) Recent Results (from the past 240 hour(s))  Culture, blood (Routine X 2) w Reflex to ID Panel     Status: None   Collection Time: 11/21/17  1:46 PM  Result Value Ref Range Status   Specimen Description BLOOD BLOOD RIGHT HAND  Final   Special Requests   Final    BOTTLES DRAWN AEROBIC AND ANAEROBIC Blood  Culture adequate volume   Culture   Final    NO GROWTH 5 DAYS Performed at [facility], [place] [address], [place], [place] [address]    Report Status 11/26/2017 FINAL  Final  Culture, blood (Routine X 2) w Reflex to ID Panel     Status: None   Collection Time: 11/21/17  1:52 PM  Result Value Ref Range Status   Specimen Description BLOOD RIGHT ANTECUBITAL  Final   Special Requests   Final    BOTTLES DRAWN AEROBIC AND ANAEROBIC Blood Culture results may not be optimal due to an inadequate volume of blood received in culture bottles   Culture   Final    NO GROWTH 5 DAYS Performed at [facility], [place] [address] [place], [place] [address]    Report Status 11/26/2017 FINAL  Final      Studies: No results found.  Scheduled Meds: . risperiDONE  0.5 mg Oral QHS  . rOPINIRole  0.25 mg Oral QHS  . sertraline  200 mg Oral Daily    Continuous Infusions:    LOS: 15 days     Phillips Climes, MD [facility] Pager [phone]  If 7PM-7AM, please contact night-coverage [email or web address] Password [email or web address] 11/30/2017, 10:34 AM

## 2017-11-30 NOTE — Progress Notes (Signed)
CSW called to [facility] for bed avaliability. Currently no beds avaliable. Pt now third on waiting list. Will continue to assist as needed.

## 2017-11-30 NOTE — Plan of Care (Signed)
Patient was drowsy all evening.  Patient was non-communicative.  She only responded when asked to state her name and birthday.  No teach back displayed.

## 2017-12-01 NOTE — Progress Notes (Signed)
CSW spoke with pt dtr this morning about reconsidering other hospice options as [facility] has had to push patient back on the waitlist.  Dtr expresses understanding and is agreeable for CSW to contact [facility]- referral sent for review  Jorge Ny, [place] Social Worker [phone]

## 2017-12-01 NOTE — Plan of Care (Signed)
Patient was drowsy and non-communicative.  No teach back displayed.

## 2017-12-01 NOTE — Progress Notes (Signed)
PROGRESS NOTE  Briana [name] WLN:[id number] DOB: 05/12/1946 DOA: 11/15/2017 PCP: Vidal Schwalbe, MD  HPI/Brief Narrative   Briana [name] is a 72 y.o. year old female with medical history significant for CAD with CABG 2005, right posterior occipital infarction, nocturnal hypoxia with 2 L oxygen at night, hypertension with LVH, end-stage renal disease with hemodialysis, chronic diastolic congestive heart failure/PAF with chronic Coumadin status post AICD placement  who presented on 11/15/2017 with daughter reporting new visual hallucinations and was found to have significant fluid overload and presumed PNA. On 6/29 Daughter decided to pursue hospice/comfort care. Further HD discontinued  On 6/29 after discussion with nephrologist ,daughter decided to pursue hospice/comfort care and discontinued further HD sessions.   Subjective Pleasant in conversation. She denies any complaints today.  Assessment/Plan: Principal Problem:   Delirium due to another medical condition Active Problems:   Mixed hyperlipidemia   Pulmonary hypertension (HCC)   Physical deconditioning   Debility   Hx of CABG 2005   Paroxysmal atrial fibrillation (HCC)   Diabetes mellitus with end-stage renal disease ([place])   ESRD (end stage renal disease) on dialysis ([place])   Depression   Essential hypertension, benign   HCAP (healthcare-associated pneumonia)   Delirium   History of CVA (cerebrovascular accident)   Supplemental oxygen dependent   Chronic diastolic congestive heart failure (HCC)   ESRD on dialysis (HCC)   PAF (paroxysmal atrial fibrillation) (HCC)   Diabetes mellitus type 2 in obese (HCC)   Anemia of chronic disease   Thrombocytopenia (HCC)   Visual hallucinations   Lobar pneumonia (HCC)   Acute on chronic hypoxic respiratory failure, secondary to volume overload and possible PNA, stable. CXR showed pulmonary congestion on admission.  Established new dry weight with HD. No further HD given  now comfort care. Continue o2 for comfort.  Plan for discharge to hospice home, awaiting bed availability.  CAP. Left sided opacity on CXR concerning for likely PNA.  Completed full course of antibiotics  ESRD on HD.  Hypervolemic on admission, tolerated consecutive HD sessions to establish new dry weight.  6/29 for the HD sessions discontinued as daughter pursued hospice/comfort care.   PAF, rate controlled.  No longer on home Lopressor given comfort care measures  CAD s/p CABG. No chest pain.   Visual hallucinations, intermittent episodes. Unclear exact cause as medically infection has been ruled out with negative blood culture. Despite treatment of PNA and removal of volume with HD no improvement. Never had elevated BUN to suggest uremia. Restarted hom BZD and pain medicine direction from psychiatry consult with little improvement and initiation of risperdal.   HLD, stable,   T2DM, A1c 9.6 stable. Very little intake throughout hospital stay with multiple fasting glucose less than 100. D/c CBG and insulin given comfort care.  Code Status: DNR.  Family Communication: None at bedside  Disposition Plan: Comfort care/ hospice, S/w consulted for assistance, awaiting bed availability,  Consultants:  Nephrology, Psychiatry  Procedures:  none  Antimicrobials: Anti-infectives (From admission, onward)   Start     Dose/Rate Route Frequency Ordered Stop   11/20/17 1000  amoxicillin-clavulanate (AUGMENTIN) 875-125 MG per tablet 1 tablet  Status:  Discontinued     1 tablet Oral Every 12 hours 11/20/17 0937 11/20/17 0940   11/20/17 1000  amoxicillin-clavulanate (AUGMENTIN) 500-125 MG per tablet 500 mg     1 tablet Oral Daily 11/20/17 0940 11/22/17 0939   11/18/17 1200  vancomycin (VANCOCIN) IVPB 750 mg/150 ml premix  Status:  Discontinued  750 mg 150 mL/hr over 60 Minutes Intravenous Every T-Th-Sa (Hemodialysis) 11/16/17 0112 11/17/17 1808   11/17/17 0230  vancomycin (VANCOCIN) IVPB 750  mg/150 ml premix     750 mg 150 mL/hr over 60 Minutes Intravenous  Once 11/17/17 0226 11/17/17 0513   11/16/17 1600  vancomycin (VANCOCIN) IVPB 750 mg/150 ml premix     750 mg 150 mL/hr over 60 Minutes Intravenous Every Sun (Hemodialysis) 11/16/17 1234 11/17/17 0428   11/16/17 0130  vancomycin (VANCOCIN) 1,500 mg in sodium chloride 0.9 % 500 mL IVPB     1,500 mg 250 mL/hr over 120 Minutes Intravenous  Once 11/16/17 0112 11/16/17 0836   11/16/17 0130  piperacillin-tazobactam (ZOSYN) IVPB 3.375 g  Status:  Discontinued     3.375 g 12.5 mL/hr over 240 Minutes Intravenous Every 12 hours 11/16/17 0112 11/20/17 0937   11/15/17 2245  cefTRIAXone (ROCEPHIN) 1 g in sodium chloride 0.9 % 100 mL IVPB     1 g 200 mL/hr over 30 Minutes Intravenous  Once 11/15/17 2242 11/15/17 2350   11/15/17 2245  azithromycin (ZITHROMAX) 500 mg in sodium chloride 0.9 % 250 mL IVPB  Status:  Discontinued     500 mg 250 mL/hr over 60 Minutes Intravenous  Once 11/15/17 2242 11/16/17 0104             DVT prophylaxis: None   Objective: Vitals:   11/29/17 2348 11/30/17 0725 11/30/17 2249 12/01/17 0814  BP: 120/61 (!) 108/45 (!) 114/38 (!) 129/47  Pulse: 60 68 60 (!) 59  Resp: 17 19 17 18   Temp: 98.3 F (36.8 C) 98 F (36.7 C) 97.7 F (36.5 C) 97.6 F (36.4 C)  TempSrc: Oral Oral Oral Oral  SpO2: 95% 96% 96% 96%  Weight:      Height:       No intake or output data in the 24 hours ending 12/01/17 1357 Filed Weights   11/19/17 1305 11/20/17 1450 11/20/17 1930  Weight: 98 kg (216 lb 0.8 oz) 97.9 kg (215 lb 13.3 oz) 94.3 kg (207 lb 14.3 oz)    Exam:  In bed, in no apparent distress, more lethargic today, slightly confused Good air entry Abdomen soft No leg edema   Data Reviewed: CBC: No results for input(s): WBC, NEUTROABS, HGB, HCT, MCV, PLT in the last 168 hours. Basic Metabolic Panel: No results for input(s): NA, K, CL, CO2, GLUCOSE, BUN, CREATININE, CALCIUM, MG, PHOS in the last 168  hours. GFR: Estimated Creatinine Clearance: 10.9 mL/min (A) (by C-G formula based on SCr of 5.19 mg/dL (H)). Liver Function Tests: No results for input(s): AST, ALT, ALKPHOS, BILITOT, PROT, ALBUMIN in the last 168 hours. No results for input(s): LIPASE, AMYLASE in the last 168 hours. No results for input(s): AMMONIA in the last 168 hours. Coagulation Profile: No results for input(s): INR, PROTIME in the last 168 hours. Cardiac Enzymes: No results for input(s): CKTOTAL, CKMB, CKMBINDEX, TROPONINI in the last 168 hours. BNP (last 3 results) No results for input(s): PROBNP in the last 8760 hours. HbA1C: No results for input(s): HGBA1C in the last 72 hours. CBG: No results for input(s): GLUCAP in the last 168 hours. Lipid Profile: No results for input(s): CHOL, HDL, LDLCALC, TRIG, CHOLHDL, LDLDIRECT in the last 72 hours. Thyroid Function Tests: No results for input(s): TSH, T4TOTAL, FREET4, T3FREE, THYROIDAB in the last 72 hours. Anemia Panel: No results for input(s): VITAMINB12, FOLATE, FERRITIN, TIBC, IRON, RETICCTPCT in the last 72 hours. Urine analysis:    Component  Value Date/Time   COLORURINE AMBER (A) 12/18/2016 0625   APPEARANCEUR HAZY (A) 12/18/2016 0625   LABSPEC 1.021 12/18/2016 0625   PHURINE 6.0 12/18/2016 0625   GLUCOSEU 150 (A) 12/18/2016 0625   HGBUR NEGATIVE 12/18/2016 0625   BILIRUBINUR NEGATIVE 12/18/2016 0625   KETONESUR NEGATIVE 12/18/2016 0625   PROTEINUR 100 (A) 12/18/2016 0625   UROBILINOGEN 0.2 03/02/2014 1508   NITRITE NEGATIVE 12/18/2016 0625   LEUKOCYTESUR SMALL (A) 12/18/2016 0625   Sepsis Labs: @LABRCNTIP (procalcitonin:4,lacticidven:4)  ) No results found for this or any previous visit (from the past 240 hour(s)).    Studies: No results found.  Scheduled Meds: . risperiDONE  0.5 mg Oral QHS  . rOPINIRole  0.25 mg Oral QHS  . sertraline  200 mg Oral Daily    Continuous Infusions:    LOS: 16 days     Phillips Climes, MD [facility] Pager [phone]  If 7PM-7AM, please contact night-coverage [email or web address] Password [email or web address] 12/01/2017, 1:57 PM

## 2017-12-01 NOTE — Progress Notes (Signed)
Nutrition Brief Note  Chart reviewed. Pt now transitioning to comfort care.  No further nutrition interventions warranted at this time.  Please re-consult as needed.   Lyndie Vanderloop A. Zaidin Blyden, RD, LDN, CDE Pager: [phone] After hours Pager: [phone]  

## 2017-12-02 MED ORDER — IPRATROPIUM-ALBUTEROL 0.5-2.5 (3) MG/3ML IN SOLN: 3.0000 mL | RESPIRATORY_TRACT | Status: AC | PRN

## 2017-12-02 MED ORDER — DIAZEPAM 5 MG PO TABS: 5.0000 mg | ORAL_TABLET | Freq: Three times a day (TID) | ORAL | 0 refills | Status: AC | PRN

## 2017-12-02 MED ORDER — ROPINIROLE HCL 0.25 MG PO TABS: 0.2500 mg | ORAL_TABLET | Freq: Every day | ORAL | 3 refills | Status: AC

## 2017-12-02 MED ORDER — HYDROCODONE-ACETAMINOPHEN 5-325 MG PO TABS: 1.0000 | ORAL_TABLET | Freq: Four times a day (QID) | ORAL | 0 refills | Status: AC | PRN

## 2017-12-02 MED ORDER — SERTRALINE HCL 100 MG PO TABS: 200.0000 mg | ORAL_TABLET | Freq: Every day | ORAL | Status: AC

## 2017-12-02 MED ORDER — RISPERIDONE 0.5 MG PO TABS: 0.5000 mg | ORAL_TABLET | Freq: Every day | ORAL | Status: AC

## 2017-12-02 MED ORDER — ACETAMINOPHEN 325 MG PO TABS: 650.0000 mg | ORAL_TABLET | Freq: Four times a day (QID) | ORAL | Status: AC | PRN

## 2017-12-02 MED ORDER — LORAZEPAM 1 MG PO TABS: 1.0000 mg | ORAL_TABLET | ORAL | 0 refills | Status: AC | PRN

## 2017-12-02 MED ORDER — MORPHINE SULFATE (CONCENTRATE) 10 MG/0.5ML PO SOLN: 5.0000 mg | ORAL | Status: AC | PRN

## 2017-12-02 NOTE — Progress Notes (Signed)
Patient will discharge to [facility] Anticipated discharge date:  7/9 Family notified: pt dtr Transportation by [facility]- scheduled for 1pm  CSW signing off.  Jorge Ny, LCSW Clinical Social Worker [phone]

## 2017-12-02 NOTE — Discharge Instructions (Signed)
Management per hospice facility, feeding for comfort

## 2017-12-02 NOTE — Discharge Summary (Signed)
Briana [name], is a 72 y.o. female  DOB Dec 14, 1945  MRN [id number].  Admission date:  11/15/2017  Admitting Physician  Kayleen Memos, DO  Discharge Date:  12/02/2017   Primary MD  Vidal Schwalbe, MD  Recommendations for primary care physician for things to follow:  -Management per hospice  Admission Diagnosis  Hallucinations;Confused     Discharge Diagnosis  Hallucinations;Confused     Principal Problem:   Delirium due to another medical condition Active Problems:   Mixed hyperlipidemia   Pulmonary hypertension ([place])   Physical deconditioning   Debility   Hx of CABG 2005   Paroxysmal atrial fibrillation (HCC)   Diabetes mellitus with end-stage renal disease ([place])   ESRD (end stage renal disease) on dialysis ([place])   Depression   Essential hypertension, benign   HCAP (healthcare-associated pneumonia)   Delirium   History of CVA (cerebrovascular accident)   Supplemental oxygen dependent   Chronic diastolic congestive heart failure ([place])   ESRD on dialysis ([place])   PAF (paroxysmal atrial fibrillation) ([place])   Diabetes mellitus type 2 in obese (HCC)   Anemia of chronic disease   Thrombocytopenia (HCC)   Visual hallucinations   Lobar pneumonia ([place])      Past Medical History:  Diagnosis Date  . Adenomatous colon polyp 09/2007  . Arthritis   . Atrial fibrillation with rapid ventricular response ([place]) 10/05/2015  . Bradycardia   . CAD (coronary artery disease)    a. s/p CABG 2005.  . Carotid artery disease ([place])    a. s/p R CEA 2011. Followed by vascular -  duplex 04/16/16: <39% RICA, 03-00% LICA.  Marland Kitchen Chronic anemia   . Chronic diastolic CHF (congestive heart failure) ([place])   . Depression with anxiety   . Diabetes mellitus   . ESRD (end stage renal disease) ([place])    T Th Sat  . Heart murmur   . Hyperlipidemia   . Hypertension   . Neuropathy   . PAD (peripheral artery disease)  ([place])   . Paroxysmal atrial flutter ([place])   . Peripheral vascular disease ([place])   . Pulmonary hypertension ([place])   . Rheumatic fever   . Sleep apnea     Past Surgical History:  Procedure Laterality Date  . BASCILIC VEIN TRANSPOSITION Left 12/04/2015   Procedure: BASILIC VEIN TRANSPOSITION;  Surgeon: Rosetta Posner, MD;  Location: [facility];  Service: Vascular;  Laterality: Left;  . CAROTID ENDARTERECTOMY  10/04/2009   Right CEA  . CORONARY ARTERY BYPASS GRAFT  2005  . ENDARTERECTOMY Left 12/18/2016   Procedure: ENDARTERECTOMY CAROTID;  Surgeon: Rosetta Posner, MD;  Location: [facility];  Service: Vascular;  Laterality: Left;  . HEMATOMA EVACUATION Left 12/18/2016   Procedure: EVACUATION of left neck HEMATOMA;  Surgeon: Elam Dutch, MD;  Location: [facility];  Service: Vascular;  Laterality: Left;  . PACEMAKER IMPLANT N/A 11/06/2016   Procedure: Pacemaker Implant;  Surgeon: Evans Lance, MD;  Location: [facility];  Service: Cardiovascular;  Laterality: N/A;  .  PATCH ANGIOPLASTY Left 12/18/2016   Procedure: PATCH ANGIOPLASTY WITH HEMASHIELD PLATIUM FINESSE PATCH;  Surgeon: Rosetta Posner, MD;  Location: [facility];  Service: Vascular;  Laterality: Left;  . PERIPHERAL VASCULAR CATHETERIZATION N/A 06/14/2016   Procedure: A/V Fistulagram - Left Upper;  Surgeon: Elam Dutch, MD;  Location: [facility];  Service: Cardiovascular;  Laterality: N/A;  . POLYPECTOMY  02/25/2012   Procedure: POLYPECTOMY;  Surgeon: Danie Binder, MD;  Location: [facility];  Service: Endoscopy;  Laterality: N/A;  . TUBAL LIGATION         History of present illness and  Hospital Course:     Kindly see H&P for history of present illness and admission details, please review complete Labs, Consult reports and Test reports for all details in brief  HPI  from the history and physical done on the day of admission 11/16/2017   HPI: Briana [name] is a 71 y.o. female with medical history significant for end-stage renal  disease on dialysis Tuesday Thursday Saturday, nocturnal hypoxia on 2 L of oxygen at night, coronary artery disease status post CABG, paroxysmal A. fib on Coumadin, post AICD placement, chronic diastolic CHF, pulmonary hypertension, who presented to ED [facility] from home accompanied by her daughter due to persistent visual hallucinations.  Onset today.  CT head no contrast done in the ED revealed possible old infarct in the region of the right posterior occipital lobe.  At the time of this evaluation patient was alert and oriented x3 and stated that she was no longer having hallucinations.  Self-reported completion of her hemodialysis today.   ED Course: Upon presentation to the ED acute on chronic hypoxia noted.  Chest x-ray on presentation revealed left lower lobe infiltrates versus atelectasis and increase in pulmonary vascularity with suspicion for fluid overload.  Started on IV antibiotics in the ED.  Lab studies remarkable for hyponatremia with sodium of 133 and elevated lactic acid at 2.18.     Hospital Course   Briana [name] is a 72 y.o. year old female with medical history significant for CAD with CABG 2005, right posterior occipital infarction, nocturnal hypoxia with 2 L oxygen at night, hypertension with LVH, end-stage renal disease with hemodialysis, chronic diastolic congestive heart failure/PAF with chronic Coumadin status post AICD placement  who presented on 11/15/2017 with daughter reporting new visual hallucinations and was found to have significant fluid overload and presumed PNA. On 6/29 Daughter decided to pursue hospice/comfort care. Further HD discontinued  On 6/29 after discussion with nephrologist ,daughter decided to pursue hospice/comfort care and discontinued further HD sessions.   Acute on chronic hypoxic respiratory failure, secondary to volume overload and possible PNA, stable. CXR showed pulmonary congestion on admission.  Established new dry weight with HD. No  further HD given now comfort care. Continue o2 for comfort.  Plan for discharge to hospice home,   CAP. Left sided opacity on CXR concerning for likely PNA.  Completed full course of antibiotics  ESRD on HD.  Hypervolemic on admission, tolerated consecutive HD sessions to establish new dry weight.  6/29 for the HD sessions discontinued as daughter pursued hospice/comfort care.   PAF, rate controlled.  No longer on home Lopressor given comfort care measures  CAD s/p CABG. No chest pain.   Visual hallucinations, intermittent episodes. Unclear exact cause as medically infection has been ruled out with negative blood culture. Despite treatment of PNA and removal of volume with HD no improvement. Never had elevated BUN to suggest  uremia. Restarted hom BZD and pain medicine direction from psychiatry consult with little improvement and initiation of risperdal.   HLD, stable,   T2DM, A1c 9.6 stable. Very little intake throughout hospital stay with multiple fasting glucose less than 100. D/c CBG and insulin given comfort care.     Discharge Condition:  Hospice/comfort care   Follow UP  Contact information for after-discharge care    [facility] .   Service:  Skilled Nursing Contact information: [place] [phone]                Discharge Instructions  and  Discharge Medications    Discharge Instructions    Discharge instructions   Complete by:  As directed    Management per hospice facility, feeding for comfort     Allergies as of 12/02/2017      Reactions   Tape Other (See Comments)   TAPE WILL CAUSE BLISTERS/SORES; PLEASE USE COBAN WRAP!!   Gemfibrozil Other (See Comments)   Patient is not aware of this allergy but states that she had side effects to a cholesterol medication in the past   Oxycodone Other (See Comments)   Hallucinations   Carbamazepine Other (See Comments)   Reaction  to tegretol - "loopy"   Clonazepam Other (See Comments)   Sleepy    Macrobid [nitrofurantoin] Nausea And Vomiting      Medication List    STOP taking these medications   aspirin 81 MG EC tablet   atorvastatin 80 MG tablet Commonly known as:  LIPITOR   blood glucose meter kit and supplies Kit   calcium acetate 667 MG capsule Commonly known as:  PHOSLO   cetirizine 10 MG tablet Commonly known as:  ZYRTEC   ferric citrate 1 GM 210 MG(Fe) tablet Commonly known as:  AURYXIA   Insulin Lispro Prot & Lispro (75-25) 100 UNIT/ML Kwikpen Commonly known as:  HUMALOG MIX 75/25 KWIKPEN   metoprolol tartrate 50 MG tablet Commonly known as:  LOPRESSOR   nitroGLYCERIN 0.4 MG SL tablet Commonly known as:  NITROSTAT   polyethylene glycol packet Commonly known as:  MIRALAX / GLYCOLAX   protein supplement Powd   Vitamin D (Ergocalciferol) 50000 units Caps capsule Commonly known as:  DRISDOL   warfarin 5 MG tablet Commonly known as:  COUMADIN     TAKE these medications   acetaminophen 325 MG tablet Commonly known as:  TYLENOL Take 2 tablets (650 mg total) by mouth every 6 (six) hours as needed for mild pain (or Fever >/= 101).   diazepam 5 MG tablet Commonly known as:  VALIUM Take 1 tablet (5 mg total) by mouth 3 (three) times daily as needed for anxiety.   HYDROcodone-acetaminophen 5-325 MG tablet Commonly known as:  NORCO/VICODIN Take 1 tablet by mouth every 6 (six) hours as needed for moderate pain (Must last 30 days).   ipratropium-albuterol 0.5-2.5 (3) MG/3ML Soln Commonly known as:  DUONEB Take 3 mLs by nebulization every 2 (two) hours as needed.   LORazepam 1 MG tablet Commonly known as:  ATIVAN Take 1 tablet (1 mg total) by mouth every 4 (four) hours as needed for anxiety.   morphine CONCENTRATE 10 MG/0.5ML Soln concentrated solution Take 0.25 mLs (5 mg total) by mouth every 2 (two) hours as needed for moderate pain (or dyspnea).   OXYGEN Inhale 2 L into the  lungs continuous.   risperiDONE 0.5 MG tablet Commonly known as:  RISPERDAL Take 1 tablet (0.5 mg total) by mouth at bedtime.   rOPINIRole 0.25 MG tablet Commonly known as:  REQUIP Take 1 tablet (0.25 mg total) by mouth at bedtime.   sertraline 100 MG tablet Commonly known as:  ZOLOFT Take 2 tablets (200 mg total) by mouth daily.         Diet and Activity recommendation: See Discharge Instructions above   Consults obtained - renal   Major procedures and Radiology Reports - PLEASE review detailed and final reports for all details, in brief -     Dg Chest 2 View  Result Date: 11/15/2017 CLINICAL DATA:  Visual hallucinations for several days. EXAM: CHEST - 2 VIEW COMPARISON:  11/07/2016. FINDINGS: Progressive enlargement of the cardiac silhouette. Increased prominence of the pulmonary vasculature and interstitial markings. No pleural fluid. Interval patchy and linear density at the left lung base. Stable post CABG changes and right subclavian pacemaker leads. Thoracic spine degenerative changes. IMPRESSION: 1. Progressive cardiomegaly with progressive mild changes of congestive heart failure. 2. Interval left basilar atelectasis and possible pneumonia. Electronically Signed   By: Claudie Revering M.D.   On: 11/15/2017 22:36   Ct Head Wo Contrast  Result Date: 11/15/2017 CLINICAL DATA:  Hallucinations. History of end-stage renal disease, chronic anemia, congestive heart failure, diabetes, hypertension, hyperlipidemia, coronary artery disease. EXAM: CT HEAD WITHOUT CONTRAST TECHNIQUE: Contiguous axial images were obtained from the base of the skull through the vertex without intravenous contrast. COMPARISON:  MRI brain 03/02/2014.  CT head 03/02/2014. FINDINGS: Brain: Mild cerebral atrophy. Mild ventricular dilatation consistent with central atrophy. Patchy low-attenuation changes in the deep white matter likely representing small vessel ischemic change. Small focal encephalomalacia in the  right occipital lobe likely due to old infarct. No mass effect or midline shift. No abnormal extra-axial fluid collections. Gray-white matter junctions are distinct. Basal cisterns are not effaced. No acute intracranial hemorrhage. Vascular: Intracranial arterial vascular calcifications are present. Skull: Calvarium appears intact. No acute depressed skull fractures. Sinuses/Orbits: No acute finding. Other: None. IMPRESSION: No acute intracranial abnormalities. Chronic atrophy and small vessel ischemic changes. 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 Electronically Signed   By: Lucienne Capers M.D.   On: 11/15/2017 23:18   Dg Chest Port 1 View  Result Date: 11/17/2017 CLINICAL DATA:  Hypoxia. EXAM: PORTABLE CHEST 1 VIEW COMPARISON:  November 15, 2016 FINDINGS: Stable cardiomegaly. The hila and mediastinum are unremarkable. Effusion and opacity in left base is stable. Mild pulmonary venous congestion. Mild fluid in the right-sided fissure. No other interval changes. IMPRESSION: 1. The small left effusion and underlying opacity is stable. 2. Mild pulmonary venous congestion. Mild fluid in the right minor fissure. These findings are stable as well. Electronically Signed   By: Dorise Bullion III M.D   On: 11/17/2017 12:43    Micro Results     No results found for this or any previous visit (from the past 240 hour(s)).     Today   Subjective:   Yoland Wragg today denies any complaints Objective:   Blood pressure (!) 132/58, pulse 63, temperature 98.2 F (36.8 C), temperature source Oral, resp. rate 18, height '5\' 4"'  (1.626 m), weight 94.3 kg (207 lb 14.3 oz), SpO2 97 %.   Intake/Output Summary (Last 24 hours) at 12/02/2017 1103 Last data filed at 12/01/2017 2315 Gross per 24 hour  Intake 0 ml  Output -  Net 0 ml    Exam  In bed, in no apparent  distress,  somnolent, confused good air entry Abdomen soft No leg edema    Data Review   CBC w Diff:    Lab Results  Component Value Date   WBC 5.4 11/22/2017   HGB 12.0 11/22/2017   HCT 38.9 11/22/2017   PLT 129 (L) 11/22/2017   LYMPHOPCT 29 04/25/2016   MONOPCT 10 04/25/2016   EOSPCT 3 04/25/2016   BASOPCT 1 04/25/2016    CMP:  Lab Results  Component Value Date   NA 135 11/22/2017   K 3.4 (L) 11/22/2017   CL 94 (L) 11/22/2017   CO2 27 11/22/2017   BUN 25 (H) 11/22/2017   CREATININE 5.19 (H) 11/22/2017   CREATININE 3.50 (H) 04/25/2016   PROT 6.8 11/16/2017   ALBUMIN 3.0 (L) 11/19/2017   BILITOT 1.2 11/16/2017   ALKPHOS 121 11/16/2017   AST 22 11/16/2017   ALT 18 11/16/2017  .   Total Time in preparing paper work, data evaluation and todays exam - 25 minutes  Phillips Climes M.D on 12/02/2017 at 11:03 AM  [facility]   Office  [phone]

## 2017-12-17 ENCOUNTER — Ambulatory Visit: Payer: Self-pay | Admitting: *Deleted

## 2017-12-25 DEATH — deceased

## 2018-01-08 ENCOUNTER — Encounter: Payer: Self-pay | Admitting: Cardiology

## 2018-01-14 ENCOUNTER — Ambulatory Visit: Payer: Medicare Other | Admitting: Cardiovascular Disease

## 2018-01-21 ENCOUNTER — Ambulatory Visit: Payer: Medicare Other | Admitting: Orthopaedic Surgery

## 2018-02-11 ENCOUNTER — Ambulatory Visit: Payer: Medicare Other

## 2018-06-14 IMAGING — US US CAROTID DUPLEX BILAT
1 series · 13 of 24 positions shown · non-contrast
Comparison: Prior duplex carotid ultrasound 09/29/2015

ADDENDUM:
Additionally, there is a small amount of wall adherent non occlusive
thrombus versus eccentric wall thickening/fibrin sheath in the right
internal jugular vein. This is likely related to prior right IJ
catheter placement.
CLINICAL DATA: 69-year-old female with a history of bilateral
carotid stenosis

EXAM:
BILATERAL CAROTID DUPLEX ULTRASOUND
TECHNIQUE: Gray scale imaging, color Doppler and duplex ultrasound were
performed of bilateral carotid and vertebral arteries in the neck.

[Series 1: us carotid duplex bilat · 0.05mm/px · 13 of 114 slices shown]
[im 1/114]
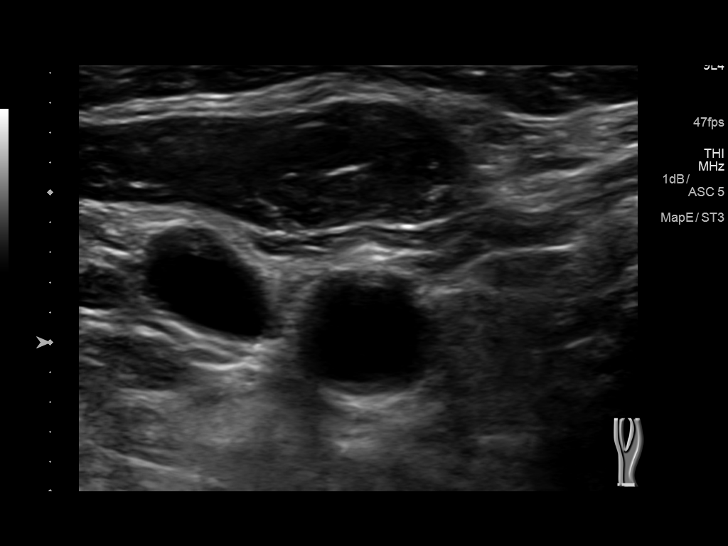
[im 10/114]
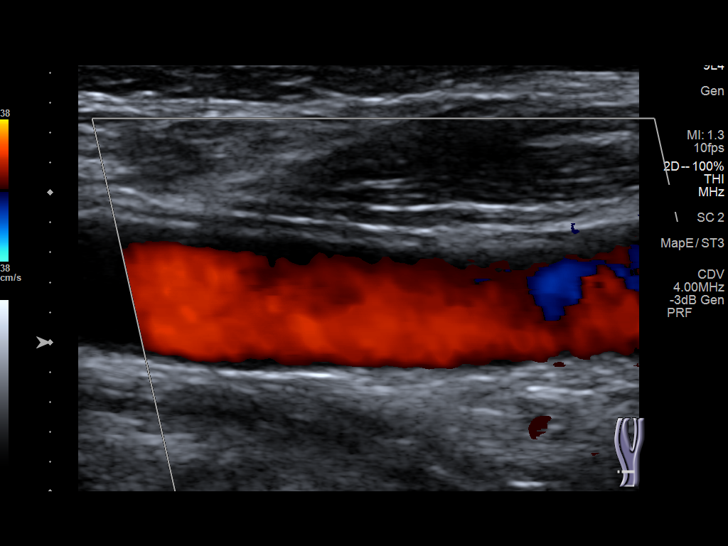
[im 20/114]
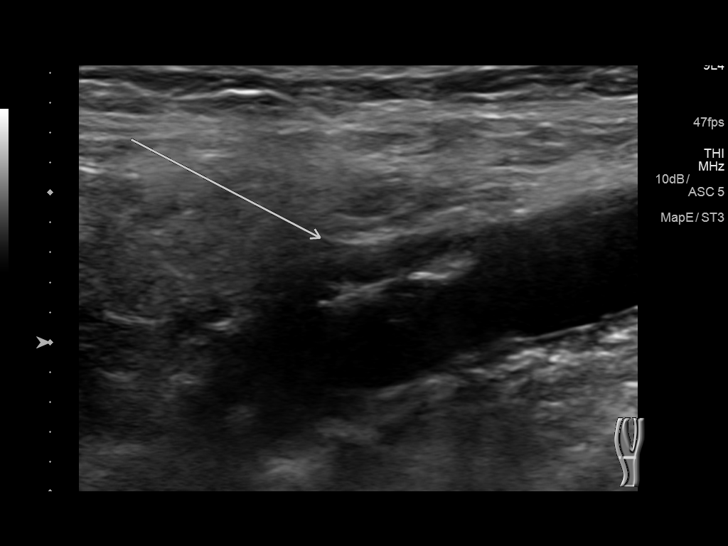
[im 30/114]
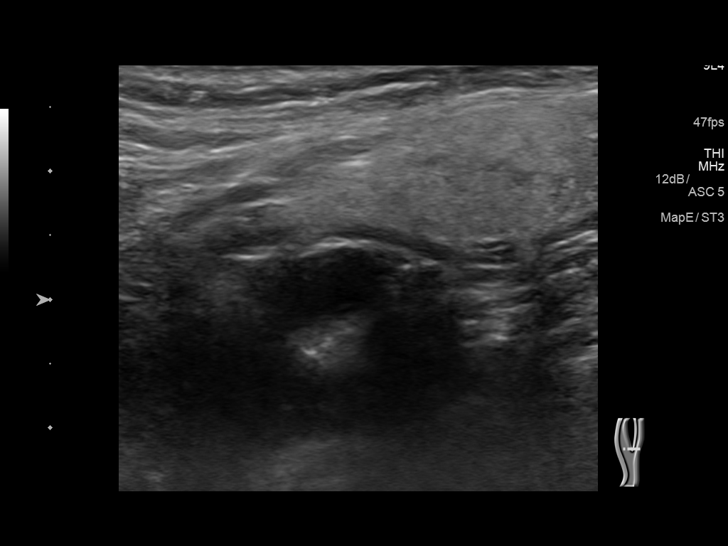
[im 40/114]
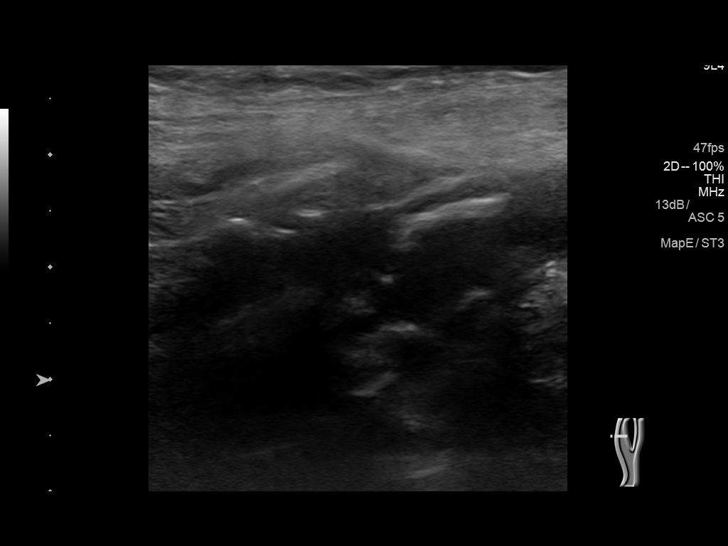
[im 50/114]
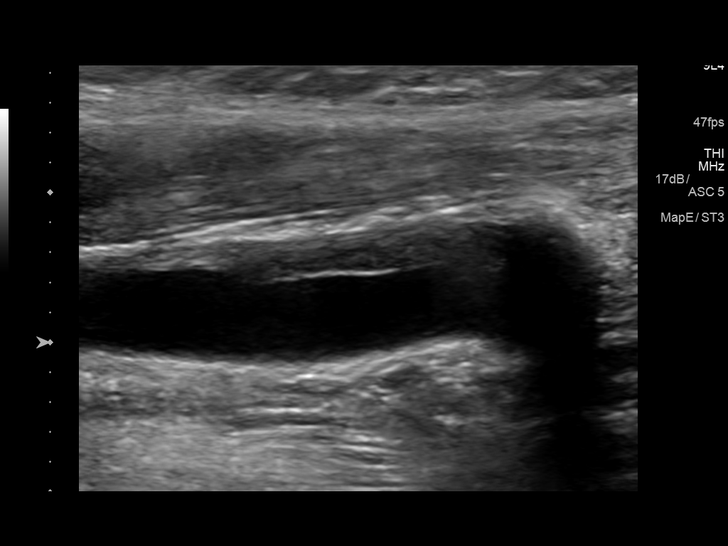
[im 59/114]
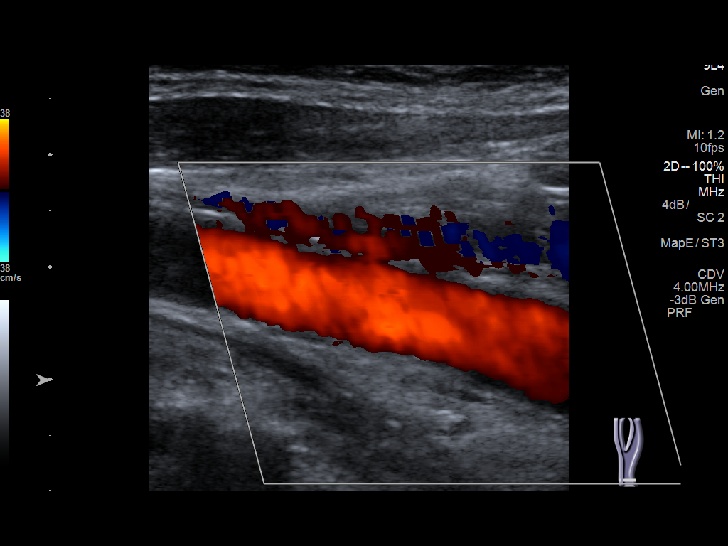
[im 64/114]
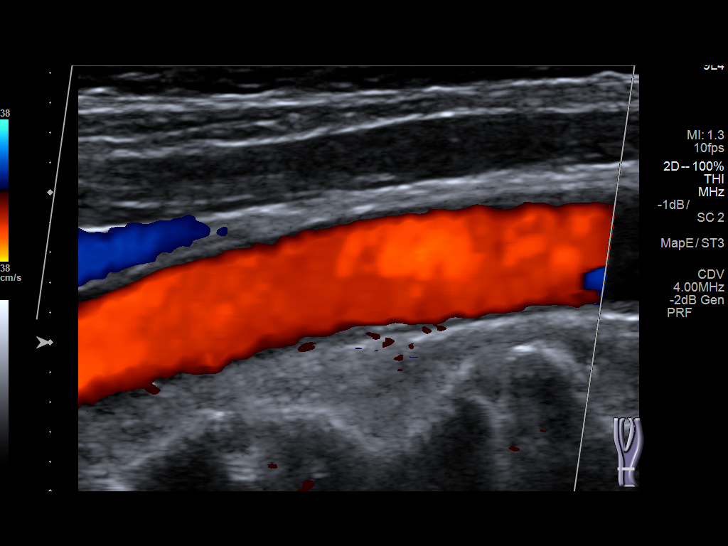
[im 74/114]
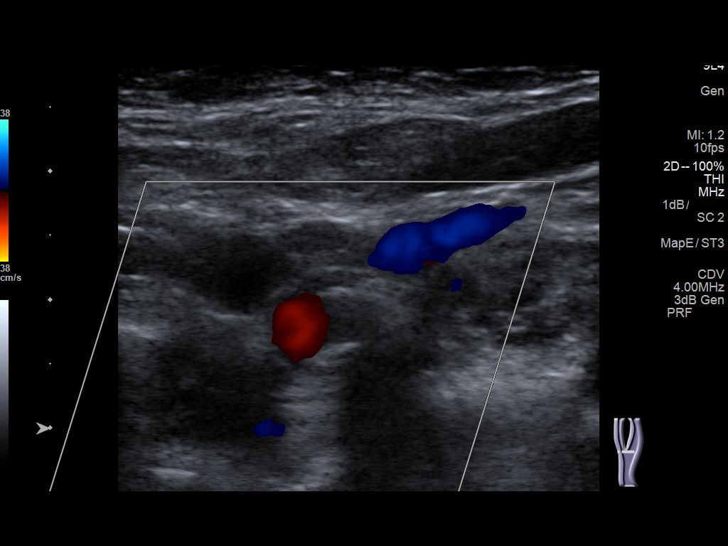
[im 84/114]
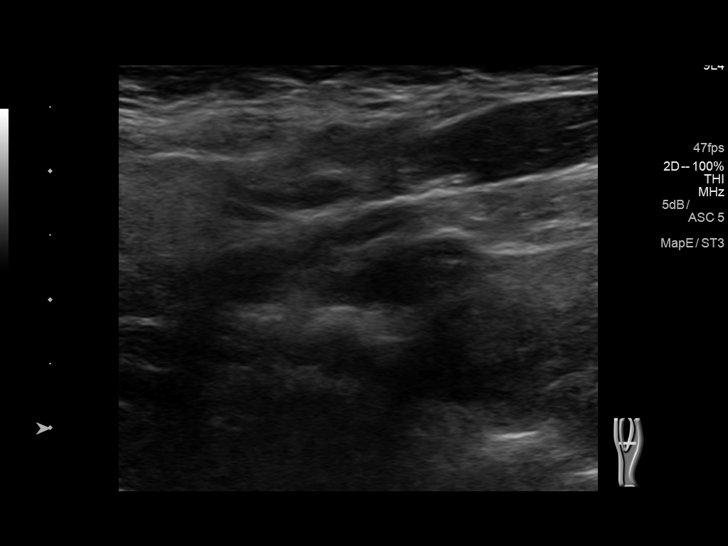
[im 94/114]
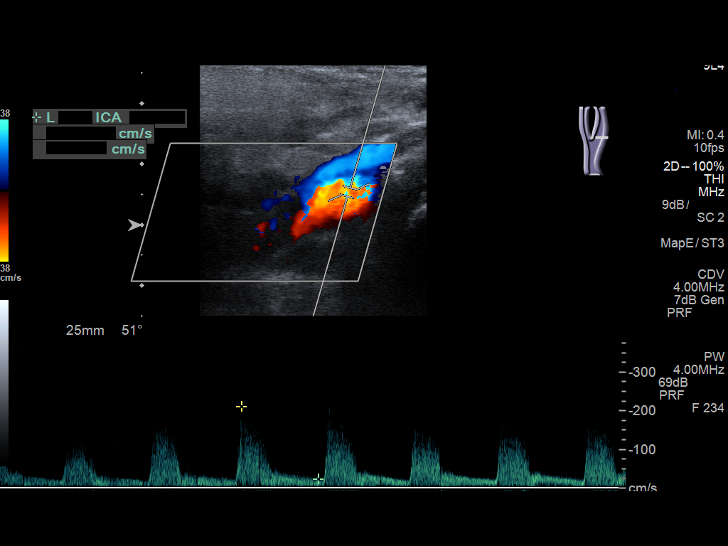
[im 104/114]
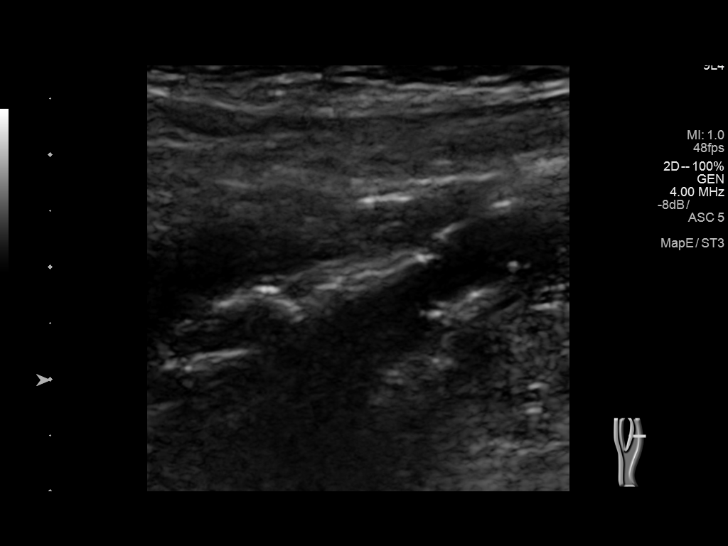
[im 114/114]
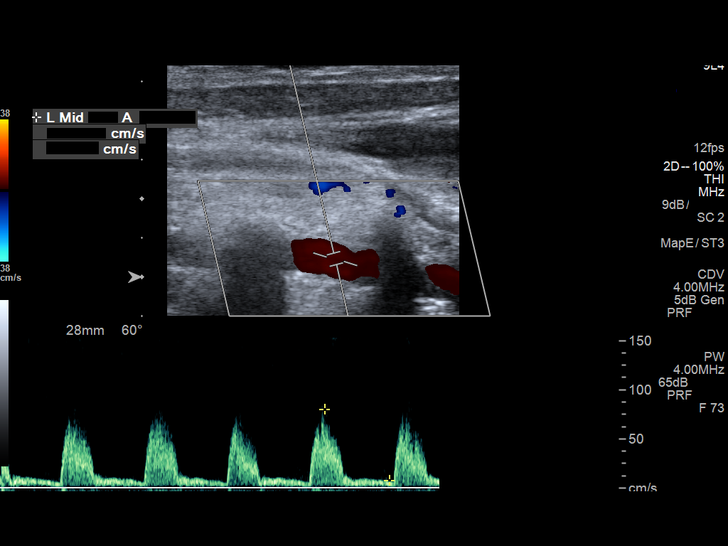

[13 of 24 positions shown; findings below may reference images not displayed]

FINDINGS: Criteria: Quantification of carotid stenosis is based on velocity
parameters that correlate the residual internal carotid diameter
with NASCET-based stenosis levels, using the diameter of the distal
internal carotid lumen as the denominator for stenosis measurement.

The following velocity measurements were obtained:

RIGHT

ICA:  171/25 cm/sec

CCA:  114/14 cm/sec

SYSTOLIC ICA/CCA RATIO:

DIASTOLIC ICA/CCA RATIO:

ECA:  189 cm/sec

LEFT

ICA:  340/23 cm/sec

CCA:  98/12 cm/sec

SYSTOLIC ICA/CCA RATIO:

DIASTOLIC ICA/CCA RATIO:

ECA:  104 cm/sec

RIGHT CAROTID ARTERY: Heterogeneous atherosclerotic plaque in the
carotid bifurcation extending into the proximal internal carotid
artery. By peak systolic velocity criteria there is an estimated 50-
69% diameter stenosis.

RIGHT VERTEBRAL ARTERY:  Patent with normal antegrade flow.

LEFT CAROTID ARTERY: Heterogeneous atherosclerotic plaque in the
distal common carotid artery extending into the proximal internal
carotid artery. There is significant elevation of the peak systolic
velocity consistent with a 70- 99% diameter stenosis. There is
associated spectral broadening, decreased diastolic flow and high
velocity jetting. A portion of the artery is obscured secondary to
shadowing from the partially calcified atherosclerotic plaque.

LEFT VERTEBRAL ARTERY:  Patent with normal antegrade flow.
IMPRESSION: 1. Advanced (70- 99%) stenosis proximal left internal carotid artery
secondary to heterogeneous atherosclerotic plaque. There is
decreased visibility of color flow within the diseased segment of
the internal carotid artery which may be due either to shadowing
from the partially calcified atherosclerotic plaque or developing
focal occlusion.
2. Moderate (50-69%) stenosis proximal right internal carotid artery
secondary to heterogenous atherosclerotic plaque. This estimate may
be slightly exaggerated secondary to compensatory elevation of the
peak systolic velocities secondary to the high-grade stenosis on the
left side.
3. Vertebral arteries remain patent with normal antegrade flow.

## 2018-09-16 ENCOUNTER — Telehealth: Payer: Self-pay | Admitting: Physician Assistant

## 2018-09-16 NOTE — Telephone Encounter (Signed)
Called home phone number to reach out for over due pacemaker check.  A gentleman answered, said he was the patient's son-in-law Hollice Espy, and reports that the patient passed away 01-08-18.  Gave my condolences, and would update our record and place a note in her chart.  Tommye Standard, PA-C
